# Patient Record
Sex: Female | Born: 1970
Health system: Southern US, Community
[De-identification: ages and names within clinical notes are randomized; demographics above are authoritative.]

## PROBLEM LIST (undated history)

## (undated) DIAGNOSIS — E119 Type 2 diabetes mellitus without complications: Secondary | ICD-10-CM

## (undated) DIAGNOSIS — M898X9 Other specified disorders of bone, unspecified site: Secondary | ICD-10-CM

## (undated) DIAGNOSIS — I517 Cardiomegaly: Secondary | ICD-10-CM

## (undated) DIAGNOSIS — I998 Other disorder of circulatory system: Secondary | ICD-10-CM

## (undated) DIAGNOSIS — T82598A Other mechanical complication of other cardiac and vascular devices and implants, initial encounter: Secondary | ICD-10-CM

## (undated) DIAGNOSIS — I313 Pericardial effusion (noninflammatory): Secondary | ICD-10-CM

## (undated) DIAGNOSIS — E889 Metabolic disorder, unspecified: Secondary | ICD-10-CM

## (undated) DIAGNOSIS — E7800 Pure hypercholesterolemia, unspecified: Secondary | ICD-10-CM

## (undated) DIAGNOSIS — S88119A Complete traumatic amputation at level between knee and ankle, unspecified lower leg, initial encounter: Secondary | ICD-10-CM

## (undated) DIAGNOSIS — I509 Heart failure, unspecified: Secondary | ICD-10-CM

## (undated) DIAGNOSIS — H3500 Unspecified background retinopathy: Secondary | ICD-10-CM

## (undated) DIAGNOSIS — Z940 Kidney transplant status: Secondary | ICD-10-CM

## (undated) DIAGNOSIS — Z9289 Personal history of other medical treatment: Secondary | ICD-10-CM

## (undated) DIAGNOSIS — I10 Essential (primary) hypertension: Secondary | ICD-10-CM

## (undated) DIAGNOSIS — D649 Anemia, unspecified: Secondary | ICD-10-CM

## (undated) DIAGNOSIS — N189 Chronic kidney disease, unspecified: Secondary | ICD-10-CM

## (undated) DIAGNOSIS — I951 Orthostatic hypotension: Secondary | ICD-10-CM

## (undated) DIAGNOSIS — M9080 Osteopathy in diseases classified elsewhere, unspecified site: Secondary | ICD-10-CM

## (undated) DIAGNOSIS — K219 Gastro-esophageal reflux disease without esophagitis: Secondary | ICD-10-CM

## (undated) DIAGNOSIS — I739 Peripheral vascular disease, unspecified: Secondary | ICD-10-CM

## (undated) DIAGNOSIS — I3139 Other pericardial effusion (noninflammatory): Secondary | ICD-10-CM

## (undated) HISTORY — DX: Essential (primary) hypertension: I10

## (undated) HISTORY — DX: Complete traumatic amputation at level between knee and ankle, unspecified lower leg, initial encounter: S88.119A

## (undated) HISTORY — DX: Chronic kidney disease, unspecified: N18.9

## (undated) HISTORY — DX: Cardiomegaly: I51.7

## (undated) HISTORY — DX: Other specified disorders of bone, unspecified site: M89.8X9

## (undated) HISTORY — DX: Metabolic disorder, unspecified: E88.9

## (undated) HISTORY — PX: BLADDER SUSPENSION: SHX72

## (undated) HISTORY — PX: ABOVE KNEE LEG AMPUTATION: SUR20

## (undated) HISTORY — PX: AMPUTATION FINGER: SHX6594

## (undated) HISTORY — DX: Other mechanical complication of other cardiac and vascular devices and implants, initial encounter: T82.598A

## (undated) HISTORY — DX: Anemia, unspecified: D64.9

## (undated) HISTORY — DX: Unspecified background retinopathy: H35.00

## (undated) HISTORY — DX: Pericardial effusion (noninflammatory): I31.3

## (undated) HISTORY — DX: Other pericardial effusion (noninflammatory): I31.39

## (undated) HISTORY — PX: NEPHRECTOMY TRANSPLANTED ORGAN: SUR880

## (undated) HISTORY — DX: Other disorder of circulatory system: I99.8

## (undated) HISTORY — DX: Osteopathy in diseases classified elsewhere, unspecified site: M90.80

## (undated) HISTORY — PX: OTHER SURGICAL HISTORY: SHX169

## (undated) HISTORY — DX: Pure hypercholesterolemia, unspecified: E78.00

---

## 1998-04-13 ENCOUNTER — Other Ambulatory Visit: Admission: RE | Admit: 1998-04-13 | Discharge: 1998-04-13 | Payer: Self-pay | Admitting: *Deleted

## 1998-05-03 ENCOUNTER — Encounter: Payer: Self-pay | Admitting: Obstetrics and Gynecology

## 1998-05-03 ENCOUNTER — Ambulatory Visit (HOSPITAL_COMMUNITY): Admission: RE | Admit: 1998-05-03 | Discharge: 1998-05-03 | Payer: Self-pay | Admitting: Obstetrics and Gynecology

## 1998-05-05 ENCOUNTER — Encounter: Admission: RE | Admit: 1998-05-05 | Discharge: 1998-08-03 | Payer: Self-pay | Admitting: Family Medicine

## 2000-09-15 ENCOUNTER — Encounter: Admission: RE | Admit: 2000-09-15 | Discharge: 2000-09-15 | Payer: Self-pay | Admitting: Family Medicine

## 2001-06-19 ENCOUNTER — Encounter: Admission: RE | Admit: 2001-06-19 | Discharge: 2001-06-19 | Payer: Self-pay | Admitting: Family Medicine

## 2001-08-10 ENCOUNTER — Emergency Department (HOSPITAL_COMMUNITY): Admission: EM | Admit: 2001-08-10 | Discharge: 2001-08-10 | Payer: Self-pay

## 2002-09-16 ENCOUNTER — Encounter (INDEPENDENT_AMBULATORY_CARE_PROVIDER_SITE_OTHER): Payer: Self-pay | Admitting: *Deleted

## 2002-09-16 ENCOUNTER — Encounter: Admission: RE | Admit: 2002-09-16 | Discharge: 2002-09-16 | Payer: Self-pay | Admitting: Family Medicine

## 2002-10-07 ENCOUNTER — Encounter: Admission: RE | Admit: 2002-10-07 | Discharge: 2002-10-07 | Payer: Self-pay | Admitting: Family Medicine

## 2002-11-09 ENCOUNTER — Encounter: Admission: RE | Admit: 2002-11-09 | Discharge: 2002-11-09 | Payer: Self-pay | Admitting: Family Medicine

## 2002-11-24 ENCOUNTER — Encounter: Admission: RE | Admit: 2002-11-24 | Discharge: 2002-11-24 | Payer: Self-pay | Admitting: Family Medicine

## 2002-12-07 ENCOUNTER — Encounter: Admission: RE | Admit: 2002-12-07 | Discharge: 2002-12-07 | Payer: Self-pay | Admitting: Family Medicine

## 2003-04-20 ENCOUNTER — Emergency Department (HOSPITAL_COMMUNITY): Admission: EM | Admit: 2003-04-20 | Discharge: 2003-04-20 | Payer: Self-pay | Admitting: Emergency Medicine

## 2003-10-05 ENCOUNTER — Encounter: Admission: RE | Admit: 2003-10-05 | Discharge: 2003-10-05 | Payer: Self-pay | Admitting: Family Medicine

## 2003-12-23 ENCOUNTER — Encounter: Admission: RE | Admit: 2003-12-23 | Discharge: 2003-12-23 | Payer: Self-pay | Admitting: Sports Medicine

## 2004-04-25 ENCOUNTER — Ambulatory Visit: Payer: Self-pay | Admitting: Family Medicine

## 2004-04-27 ENCOUNTER — Encounter: Admission: RE | Admit: 2004-04-27 | Discharge: 2004-04-27 | Payer: Self-pay | Admitting: Sports Medicine

## 2004-10-05 ENCOUNTER — Ambulatory Visit: Payer: Self-pay | Admitting: Family Medicine

## 2005-01-21 ENCOUNTER — Ambulatory Visit: Payer: Self-pay | Admitting: Family Medicine

## 2005-01-25 ENCOUNTER — Encounter: Admission: RE | Admit: 2005-01-25 | Discharge: 2005-01-25 | Payer: Self-pay | Admitting: Sports Medicine

## 2005-01-31 ENCOUNTER — Emergency Department (HOSPITAL_COMMUNITY): Admission: EM | Admit: 2005-01-31 | Discharge: 2005-01-31 | Payer: Self-pay | Admitting: Family Medicine

## 2005-02-01 ENCOUNTER — Ambulatory Visit: Payer: Self-pay | Admitting: Family Medicine

## 2005-02-08 ENCOUNTER — Ambulatory Visit: Payer: Self-pay | Admitting: Family Medicine

## 2005-10-17 ENCOUNTER — Other Ambulatory Visit: Admission: RE | Admit: 2005-10-17 | Discharge: 2005-10-17 | Payer: Self-pay | Admitting: Family Medicine

## 2005-10-17 ENCOUNTER — Ambulatory Visit: Payer: Self-pay | Admitting: Family Medicine

## 2005-12-06 ENCOUNTER — Ambulatory Visit: Payer: Self-pay | Admitting: Family Medicine

## 2005-12-25 ENCOUNTER — Encounter: Admission: RE | Admit: 2005-12-25 | Discharge: 2005-12-25 | Payer: Self-pay | Admitting: Family Medicine

## 2006-04-17 ENCOUNTER — Encounter (INDEPENDENT_AMBULATORY_CARE_PROVIDER_SITE_OTHER): Payer: Self-pay | Admitting: *Deleted

## 2006-04-17 LAB — CONVERTED CEMR LAB

## 2006-04-28 ENCOUNTER — Ambulatory Visit: Payer: Self-pay | Admitting: Sports Medicine

## 2006-05-01 ENCOUNTER — Ambulatory Visit: Payer: Self-pay | Admitting: Family Medicine

## 2006-05-14 ENCOUNTER — Ambulatory Visit: Payer: Self-pay | Admitting: Family Medicine

## 2006-05-23 ENCOUNTER — Ambulatory Visit: Payer: Self-pay | Admitting: Family Medicine

## 2006-06-06 ENCOUNTER — Ambulatory Visit: Payer: Self-pay | Admitting: Family Medicine

## 2006-06-26 ENCOUNTER — Ambulatory Visit: Payer: Self-pay | Admitting: Family Medicine

## 2006-08-14 ENCOUNTER — Ambulatory Visit: Payer: Self-pay | Admitting: Family Medicine

## 2006-08-14 DIAGNOSIS — IMO0002 Reserved for concepts with insufficient information to code with codable children: Secondary | ICD-10-CM | POA: Insufficient documentation

## 2006-08-14 DIAGNOSIS — N6029 Fibroadenosis of unspecified breast: Secondary | ICD-10-CM | POA: Insufficient documentation

## 2006-08-14 DIAGNOSIS — E1169 Type 2 diabetes mellitus with other specified complication: Secondary | ICD-10-CM | POA: Insufficient documentation

## 2006-08-14 DIAGNOSIS — E7800 Pure hypercholesterolemia, unspecified: Secondary | ICD-10-CM

## 2006-08-14 DIAGNOSIS — E669 Obesity, unspecified: Secondary | ICD-10-CM | POA: Insufficient documentation

## 2006-08-14 DIAGNOSIS — E1151 Type 2 diabetes mellitus with diabetic peripheral angiopathy without gangrene: Secondary | ICD-10-CM | POA: Insufficient documentation

## 2006-08-14 DIAGNOSIS — N926 Irregular menstruation, unspecified: Secondary | ICD-10-CM | POA: Insufficient documentation

## 2006-08-14 DIAGNOSIS — E785 Hyperlipidemia, unspecified: Secondary | ICD-10-CM | POA: Insufficient documentation

## 2006-08-14 DIAGNOSIS — E1165 Type 2 diabetes mellitus with hyperglycemia: Secondary | ICD-10-CM

## 2006-08-14 HISTORY — DX: Pure hypercholesterolemia, unspecified: E78.00

## 2006-08-15 ENCOUNTER — Encounter (INDEPENDENT_AMBULATORY_CARE_PROVIDER_SITE_OTHER): Payer: Self-pay | Admitting: *Deleted

## 2007-01-02 ENCOUNTER — Encounter: Payer: Self-pay | Admitting: Family Medicine

## 2007-01-02 ENCOUNTER — Ambulatory Visit: Payer: Self-pay | Admitting: Family Medicine

## 2007-01-02 LAB — CONVERTED CEMR LAB
ALT: 17 units/L (ref 0–35)
AST: 16 units/L (ref 0–37)
Albumin: 4.1 g/dL (ref 3.5–5.2)
Alkaline Phosphatase: 61 units/L (ref 39–117)
BUN: 12 mg/dL (ref 6–23)
CO2: 22 meq/L (ref 19–32)
Calcium: 9.8 mg/dL (ref 8.4–10.5)
Chloride: 99 meq/L (ref 96–112)
Creatinine, Ser: 0.9 mg/dL (ref 0.40–1.20)
Glucose, Bld: 487 mg/dL — ABNORMAL HIGH (ref 70–99)
Hgb A1c MFr Bld: 14 %
Potassium: 5.3 meq/L (ref 3.5–5.3)
Sodium: 138 meq/L (ref 135–145)
TSH: 3.351 microintl units/mL (ref 0.350–5.50)
Total Bilirubin: 0.3 mg/dL (ref 0.3–1.2)
Total CK: 42 units/L (ref 7–177)
Total Protein: 7.3 g/dL (ref 6.0–8.3)

## 2007-06-23 ENCOUNTER — Telehealth: Payer: Self-pay | Admitting: Family Medicine

## 2007-08-06 ENCOUNTER — Encounter: Payer: Self-pay | Admitting: Family Medicine

## 2007-08-06 ENCOUNTER — Ambulatory Visit: Payer: Self-pay | Admitting: Family Medicine

## 2007-08-06 LAB — CONVERTED CEMR LAB
ALT: 25 units/L (ref 0–35)
AST: 15 units/L (ref 0–37)
Albumin: 4.4 g/dL (ref 3.5–5.2)
Alkaline Phosphatase: 67 units/L (ref 39–117)
BUN: 12 mg/dL (ref 6–23)
Beta hcg, urine, semiquantitative: NEGATIVE
CO2: 25 meq/L (ref 19–32)
Calcium: 10.4 mg/dL (ref 8.4–10.5)
Chlamydia, DNA Probe: NEGATIVE
Chloride: 94 meq/L — ABNORMAL LOW (ref 96–112)
Creatinine, Ser: 0.84 mg/dL (ref 0.40–1.20)
Direct LDL: 233 mg/dL — ABNORMAL HIGH
GC Probe Amp, Genital: NEGATIVE
Glucose, Bld: 455 mg/dL — ABNORMAL HIGH (ref 70–99)
Hgb A1c MFr Bld: 14 %
Potassium: 4.9 meq/L (ref 3.5–5.3)
Sodium: 136 meq/L (ref 135–145)
Total Bilirubin: 0.3 mg/dL (ref 0.3–1.2)
Total Protein: 7.4 g/dL (ref 6.0–8.3)
Whiff Test: POSITIVE
hCG, Beta Chain, Quant, S: <2 milliintl units/mL

## 2007-08-07 ENCOUNTER — Telehealth: Payer: Self-pay | Admitting: Family Medicine

## 2007-08-13 LAB — CONVERTED CEMR LAB
LDL Cholesterol: 233 mg/dL
Pap Smear: NORMAL
Pap Smear: NORMAL

## 2007-08-22 ENCOUNTER — Encounter: Payer: Self-pay | Admitting: Family Medicine

## 2007-09-02 ENCOUNTER — Ambulatory Visit: Payer: Self-pay | Admitting: Family Medicine

## 2007-12-26 ENCOUNTER — Emergency Department (HOSPITAL_COMMUNITY): Admission: EM | Admit: 2007-12-26 | Discharge: 2007-12-26 | Payer: Self-pay | Admitting: Family Medicine

## 2008-04-22 ENCOUNTER — Emergency Department (HOSPITAL_COMMUNITY): Admission: EM | Admit: 2008-04-22 | Discharge: 2008-04-22 | Payer: Self-pay | Admitting: Emergency Medicine

## 2008-12-21 ENCOUNTER — Encounter: Payer: Self-pay | Admitting: Family Medicine

## 2008-12-21 ENCOUNTER — Ambulatory Visit: Payer: Self-pay | Admitting: Family Medicine

## 2008-12-21 DIAGNOSIS — M109 Gout, unspecified: Secondary | ICD-10-CM | POA: Insufficient documentation

## 2008-12-21 LAB — CONVERTED CEMR LAB
Beta hcg, urine, semiquantitative: NEGATIVE
Chlamydia, DNA Probe: NEGATIVE
GC Probe Amp, Genital: NEGATIVE
Hgb A1c MFr Bld: >14 %
TSH: 2.738 microintl units/mL (ref 0.350–4.500)

## 2008-12-27 ENCOUNTER — Encounter: Payer: Self-pay | Admitting: Family Medicine

## 2009-01-11 ENCOUNTER — Ambulatory Visit: Payer: Self-pay | Admitting: Family Medicine

## 2009-01-11 LAB — CONVERTED CEMR LAB
Glucose, Urine, Semiquant: 500
Protein, U semiquant: 100
Specific Gravity, Urine: <1.005

## 2009-02-13 ENCOUNTER — Ambulatory Visit: Payer: Self-pay | Admitting: Family Medicine

## 2009-03-24 ENCOUNTER — Telehealth: Payer: Self-pay | Admitting: *Deleted

## 2009-03-31 ENCOUNTER — Encounter: Payer: Self-pay | Admitting: Family Medicine

## 2009-03-31 ENCOUNTER — Telehealth: Payer: Self-pay | Admitting: *Deleted

## 2009-03-31 ENCOUNTER — Ambulatory Visit: Payer: Self-pay | Admitting: Family Medicine

## 2009-03-31 LAB — CONVERTED CEMR LAB
ALT: 11 units/L (ref 0–35)
AST: 10 units/L (ref 0–37)
Albumin: 4 g/dL (ref 3.5–5.2)
Alkaline Phosphatase: 57 units/L (ref 39–117)
BUN: 14 mg/dL (ref 6–23)
CO2: 24 meq/L (ref 19–32)
Calcium: 9.1 mg/dL (ref 8.4–10.5)
Chloride: 102 meq/L (ref 96–112)
Creatinine, Ser: 1 mg/dL (ref 0.40–1.20)
Direct LDL: 220 mg/dL — ABNORMAL HIGH
Glucose, Bld: 326 mg/dL — ABNORMAL HIGH (ref 70–99)
Glucose: 318 mg/dL
HDL: 58 mg/dL
Potassium: 4.3 meq/L (ref 3.5–5.3)
Sodium: 139 meq/L (ref 135–145)
Total Bilirubin: 0.3 mg/dL (ref 0.3–1.2)
Total Protein: 6.8 g/dL (ref 6.0–8.3)
Triglycerides: 228 mg/dL

## 2009-04-06 ENCOUNTER — Telehealth: Payer: Self-pay | Admitting: Family Medicine

## 2009-04-28 ENCOUNTER — Encounter: Payer: Self-pay | Admitting: Family Medicine

## 2009-04-28 ENCOUNTER — Ambulatory Visit: Payer: Self-pay | Admitting: Family Medicine

## 2009-04-28 LAB — CONVERTED CEMR LAB
BUN: 21 mg/dL (ref 6–23)
CO2: 23 meq/L (ref 19–32)
Calcium: 9.9 mg/dL (ref 8.4–10.5)
Chloride: 101 meq/L (ref 96–112)
Creatinine, Ser: 1.14 mg/dL (ref 0.40–1.20)
Glucose, Bld: 285 mg/dL — ABNORMAL HIGH (ref 70–99)
Potassium: 4.6 meq/L (ref 3.5–5.3)
Sodium: 140 meq/L (ref 135–145)

## 2009-05-15 ENCOUNTER — Encounter: Payer: Self-pay | Admitting: Family Medicine

## 2009-09-22 ENCOUNTER — Ambulatory Visit: Payer: Self-pay | Admitting: Family Medicine

## 2009-09-22 ENCOUNTER — Encounter: Payer: Self-pay | Admitting: Family Medicine

## 2009-09-22 LAB — CONVERTED CEMR LAB: Hgb A1c MFr Bld: >14 %

## 2009-11-29 ENCOUNTER — Encounter: Payer: Self-pay | Admitting: Family Medicine

## 2009-11-29 ENCOUNTER — Ambulatory Visit: Payer: Self-pay | Admitting: Family Medicine

## 2009-12-05 ENCOUNTER — Telehealth: Payer: Self-pay | Admitting: Family Medicine

## 2009-12-06 ENCOUNTER — Ambulatory Visit: Payer: Self-pay | Admitting: Family Medicine

## 2009-12-06 DIAGNOSIS — R1110 Vomiting, unspecified: Secondary | ICD-10-CM | POA: Insufficient documentation

## 2009-12-25 ENCOUNTER — Ambulatory Visit: Payer: Self-pay | Admitting: Family Medicine

## 2009-12-25 ENCOUNTER — Encounter: Payer: Self-pay | Admitting: Family Medicine

## 2009-12-25 ENCOUNTER — Telehealth: Payer: Self-pay | Admitting: *Deleted

## 2009-12-25 LAB — CONVERTED CEMR LAB
BUN: 18 mg/dL (ref 6–23)
CO2: 25 meq/L (ref 19–32)
Calcium: 9.3 mg/dL (ref 8.4–10.5)
Chloride: 101 meq/L (ref 96–112)
Creatinine, Ser: 1.38 mg/dL — ABNORMAL HIGH (ref 0.40–1.20)
Glucose, Bld: 220 mg/dL — ABNORMAL HIGH (ref 70–99)
Hgb A1c MFr Bld: 11.4 %
Pap Smear: NEGATIVE
Potassium: 4.2 meq/L (ref 3.5–5.3)
Sodium: 139 meq/L (ref 135–145)

## 2010-01-02 ENCOUNTER — Encounter: Admission: RE | Admit: 2010-01-02 | Discharge: 2010-01-02 | Payer: Self-pay | Admitting: Family Medicine

## 2010-04-13 ENCOUNTER — Encounter (INDEPENDENT_AMBULATORY_CARE_PROVIDER_SITE_OTHER): Payer: Self-pay | Admitting: Pharmacist

## 2010-07-17 NOTE — Assessment & Plan Note (Signed)
Summary: f/u DM and PATIENT SUMMARY   Vital Signs:  Patient profile:   40 year old female Height:      61 inches Weight:      159 pounds BMI:     30.15 Temp:     98.0 degrees F oral BP sitting:   162 / 98  (left arm) Cuff size:   regular  Vitals Entered By: Schuyler Amor CMA (November 29, 2009 9:31 AM) CC: F/U diabetes Is Patient Diabetic? Yes Pain Assessment Patient in pain? no        Primary Care Provider:  [name]  MD  CC:  F/U diabetes.  History of Present Illness: CC: f/u DM, HTN  40 yo with T2DM dx 2000s, HTN, HLD, h/o noncompliance.  previously on byetta but stopped and tried oral meds this past year.  riddled with noncompliance.  pt endorses taking meds every visit, but then numbers have not been changing.  last visit (09/2009) pt endorsed taking meds daily as prescribed, but called pharmacy and pt had not filled glimepiride, metformin, or zestoretic since 04/28/2009 (for 5 months).   today: 1. HTN - out of meds for 1 month.  ran out and just never refilled.  feels that stress at work and at home has been contributing to noncompliance.  when on prescribed meds, bp has ben 129/86.  2. DM - Metformin BID and glimepiride daily prescribed, has not been taking regularly because thought needed to take with food, and hasn't been eating per patient.  4lb weight loss in last 3 months.  Seeing podiatrist Dr. Geroge Baseman at [facility].  has vision exam in next few months.  always has denied gi side effects from metformin.  keeps log but did not bring in today.  3. HLD - supposed to be on zocor.  brings FMLA form for me to fill out for excused absences from work due to diabetes and sickness and medical evaluations.  filled out and asked to scan into system.  needs pap after 12/21/2009, needs mammogram as just turned 39 and has fmhx of BRCA.  Habits & Providers  Alcohol-Tobacco-Diet     Tobacco Status: never  Current Medications (verified): 1)  Bayer Childrens Aspirin 81  Mg Chew (Aspirin) .... Take 1 Tablet By Mouth Once A Day 2)  Zestoretic 20-25 Mg Tabs (Lisinopril-Hydrochlorothiazide) .... One By Mouth Daily For Blood Pressure 3)  Zocor 40 Mg Tabs (Simvastatin) .Marland Kitchen.. 1 Tab By Mouth Daily 4)  Glimepiride 2 Mg Tabs (Glimepiride) .... Take One Daily 5)  Metformin Hcl 1000 Mg Tabs (Metformin Hcl) .... One By Mouth Two Times A Day With Food For Diabetes 6)  Onetouch Lancets  Misc (Lancets) .... Qs 1 Month. 7)  Blood Sugar Strips .... Use As Directed - Generic Equivalent, Qs 1 Mo 8)  Amlodipine Besylate 5 Mg Tabs (Amlodipine Besylate) .... Take One Daily in Am For Blood Pressure 9)  Byetta 5 Mcg Pen 5 Mcg/0.33ml Soln (Exenatide) .... One Injection Two Times A Day Within 1 Hour Before A Meal.  Qs 1 Month  Allergies (verified): No Known Drug Allergies  Past History:  Past medical, surgical, family and social histories (including risk factors) reviewed for relevance to current acute and chronic problems.  Past Medical History: HTN T2DM dx 2000 HLD Obesity h/o CIN1 in 2004 Diagnosed with HSV type 2  6/04 h/o chlamydia 1994  Past Surgical History: Reviewed history from 02/13/2009 and no changes required. Lasik eye surgery - 01/21/2005  Family History: Reviewed history  from 02/13/2009 and no changes required. no CAD.  +DM BRCA in 2 aunts Mom L 93 HTN Dad L. 62 HTN, PUD, CABG, used to drink a lot of ETOH Brother Died 2008-02-18 Obesity, CHF, DM  Social History: Reviewed history from 02/13/2009 and no changes required. single, No tobacco, EtOH, or drugs.  Employed working at [employer].  Received bachelor's at [employer] in accounting.  Going to go back to school for medical technology.  Currently in a 3 month relationship.  No kids.  Review of Systems       per HPI  Physical Exam  General:  Well-developed,well-nourished,in no acute distress; alert,appropriate and cooperative throughout examination Lungs:  Normal respiratory effort, chest expands  symmetrically. Lungs are clear to auscultation, no crackles or wheezes. Heart:  Normal rate and regular rhythm. S1 and S2 normal without gallop, murmur, click, rub or other extra sounds.  tachycardic  Diabetes Management Exam:    Foot Exam (with socks and/or shoes not present):       Sensory-Pinprick/Light touch:          Left medial foot (L-4): normal          Left dorsal foot (L-5): normal          Left lateral foot (S-1): normal          Right medial foot (L-4): normal          Right dorsal foot (L-5): normal          Right lateral foot (S-1): normal       Inspection:          Left foot: normal          Right foot: normal       Nails:          Left foot: normal          Right foot: normal   Impression & Recommendations:  Problem # 1:  DIABETES MELLITUS II, UNCOMPLICATED ([id number])  due for A1c next month.  h/o noncompliance.  whole year A1c has bee 14 +.  discussed need for control of sugars.  pt endorses doing well on byetta previously so started this medicine today.  Have never been able to tell if would be controlled on oral meds 2/2 noncompliance.  Discussed likely will need insulin for control.  consider referral to PK.  Asked to return in 3 wks for f/u on byetta start and to bring log of sugars to meet new PCP.  Her updated medication list for this problem includes:    Bayer Childrens Aspirin 81 Mg Chew (Aspirin) .Marland Kitchen... Take 1 tablet by mouth once a day    Zestoretic 20-25 Mg Tabs (Lisinopril-hydrochlorothiazide) ..... One by mouth daily for blood pressure    Glimepiride 2 Mg Tabs (Glimepiride) .Marland Kitchen... Take one daily    Metformin Hcl 1000 Mg Tabs (Metformin hcl) ..... One by mouth two times a day with food for diabetes    Byetta 5 Mcg Pen 5 Mcg/0.78ml Soln (Exenatide) ..... One injection two times a day within 1 hour before a meal.  qs 1 month  Labs Reviewed: Creat: 1.14 (04/28/2009)   Microalbumin: >2+   reflex to regular dipstick (01/11/2009) Reviewed HgBA1c results:  >14.0 (09/22/2009)  >14.0 (03/31/2009)  Orders: [place]- Est  Level 4 [id number])  Problem # 2:  HYPERTENSION, BENIGN SYSTEMIC (ICD-401.1)  off meds.  advised restart.  refilled scripts x 3 months. Her updated medication list for this problem includes:    Zestoretic 20-25  Mg Tabs (Lisinopril-hydrochlorothiazide) ..... One by mouth daily for blood pressure    Amlodipine Besylate 5 Mg Tabs (Amlodipine besylate) .Marland Kitchen... Take one daily in am for blood pressure  BP today: 162/98 Prior BP: 182/102 (09/22/2009)  Labs Reviewed: K+: 4.6 (04/28/2009) Creat: : 1.14 (04/28/2009)   HDL: 58 (03/31/2009)   LDL: 233 (08/13/2007)   TG: 228 (03/31/2009)  Orders: [place]- Est  Level 4 [id number])  Problem # 3:  HYPERLIPIDEMIA (ICD-272.4)  LDL 200s last check.  started zocor 40.  unsure if pt taking. Her updated medication list for this problem includes:    Zocor 40 Mg Tabs (Simvastatin) .Marland Kitchen... 1 tab by mouth daily  Labs Reviewed: SGOT: 10 (03/31/2009)   SGPT: 11 (03/31/2009)   HDL:58 (03/31/2009)  LDL:233 (08/13/2007)  Trig:228 (03/31/2009)  Orders: [place]- Est  Level 4 [id number])  Problem # 4:  GOUT (ICD-274.9)  has not been an issue since I've followed.  Problem # 5:  Preventive Health Care (ICD-V70.0) needs mammogram, needs pap as h/o CIN 1 2004.  last pap WNL.  to schedule next month.  Complete Medication List: 1)  Bayer Childrens Aspirin 81 Mg Chew (Aspirin) .... Take 1 tablet by mouth once a day 2)  Zestoretic 20-25 Mg Tabs (Lisinopril-hydrochlorothiazide) .... One by mouth daily for blood pressure 3)  Zocor 40 Mg Tabs (Simvastatin) .Marland Kitchen.. 1 tab by mouth daily 4)  Glimepiride 2 Mg Tabs (Glimepiride) .... Take one daily 5)  Metformin Hcl 1000 Mg Tabs (Metformin hcl) .... One by mouth two times a day with food for diabetes 6)  Onetouch Lancets Misc (Lancets) .... Qs 1 month. 7)  Blood Sugar Strips  .... Use as directed - generic equivalent, qs 1 mo 8)  Amlodipine Besylate 5 Mg Tabs (Amlodipine besylate)  .... Take one daily in am for blood pressure 9)  Byetta 5 Mcg Pen 5 Mcg/0.35ml Soln (Exenatide) .... One injection two times a day within 1 hour before a meal.  qs 1 month  Patient Instructions: 1)  Return in 2-3 weeks for follow up. 2)  Make appointment for complete phsycial after 12/21/2009. 3)  Start Byetta injection daily. 4)  Continue Glipizide and Metformin. 5)  Continue blood pressure medicine. 6)  When you return we will check A1c and see how your sugars are doing. 7)  Bring your log next visit as well as all your medicines.  Scripts filled out again. 8)  Good to see you today. 9)  FMLA form filled out. Prescriptions: BYETTA 5 MCG PEN 5 MCG/0.02ML SOLN (EXENATIDE) one injection two times a day within 1 hour before a meal.  qs 1 month  #1 x 1   Entered and Authorized by:   [name]  MD   Signed by:   [name]  MD on 11/29/2009   Method used:   Electronically to        [facility] (retail)       [phone]       [place], [place]  [address]       Ph: [phone]       Fax: [phone]   RxID:   [id number] AMLODIPINE BESYLATE 5 MG TABS (AMLODIPINE BESYLATE) take one daily in AM for blood pressure  #30 x 3   Entered and Authorized by:   [name]  MD   Signed by:   [name]  MD on 11/29/2009   Method used:   Electronically to  [facility] (retail)       [phone]       [place], [place]  [address]       Ph: [phone]       Fax: [phone]   RxID:   [id number] METFORMIN HCL 1000 MG TABS (METFORMIN HCL) one by mouth two times a day with food for diabetes  #60 x 3   Entered and Authorized by:   [name]  MD   Signed by:   [name]  MD on 11/29/2009   Method used:   Electronically to        [facility] (retail)       [phone]       [place], [place]  [address]        Ph: [phone]       Fax: [phone]   RxID:   [id number] GLIMEPIRIDE 2 MG TABS (GLIMEPIRIDE) take one daily  #31 x 3   Entered and Authorized by:   [name]  MD   Signed by:   [name]  MD on 11/29/2009   Method used:   Electronically to        [facility] (retail)       [phone]       [place], [place]  [address]       Ph: [phone]       Fax: [phone]   RxID:   [id number] MG TABS (SIMVASTATIN) 1 tab by mouth daily  #30 x 3   Entered and Authorized by:   [name]  MD   Signed by:   [name]  MD on 11/29/2009   Method used:   Electronically to        [facility] (retail)       [phone]       [place], [place]  [address]       Ph: [phone]       Fax: [phone]   RxID:   [id number] ZESTORETIC 20-25 MG TABS (LISINOPRIL-HYDROCHLOROTHIAZIDE) one by mouth daily for blood pressure  #30 x 3   Entered and Authorized by:   [name]  MD   Signed by:   [name]  MD on 11/29/2009   Method used:   Electronically to        [facility] (retail)       [phone]       [place], [place]  [address]       Ph: [phone]       Fax: [phone]   RxID:   [id number]    Prevention & Chronic Care Immunizations   Influenza vaccine: given  (04/26/2009)   Influenza vaccine due: 02/15/2010    Tetanus booster: 01/11/2009: Td   Tetanus booster due: 01/12/2019    Pneumococcal vaccine: Not documented  Other Screening   Pap smear: NEGATIVE FOR INTRAEPITHELIAL LESIONS OR MALIGNANCY.  (12/21/2008)   Pap smear due: 12/27/2009    Mammogram: Done.  (10/15/2004)   Mammogram due: 10/15/2005   Smoking status: never  (11/29/2009)  Diabetes Mellitus   HgbA1C: >14.0  (09/22/2009)   Hemoglobin A1C due: 11/03/2007    Eye exam: Not documented  Foot exam: yes   (11/29/2009)   Foot exam action/deferral: Do today   High risk foot: Not documented   Foot care education: Not documented   Foot exam due: 07/01/2009    Urine microalbumin/creatinine ratio: Not documented    Diabetes flowsheet reviewed?: Yes   Progress toward A1C goal: Unchanged  Lipids   Total Cholesterol: Not documented   Lipid panel action/deferral: Deferred   LDL: 233  (08/13/2007)   LDL Direct: 220  (03/31/2009)   HDL: 58  (03/31/2009)   Triglycerides: 228  (03/31/2009)   Lipid panel due: 08/16/2009    SGOT (AST): 10  (03/31/2009)   BMP action: Deferred   SGPT (ALT): 11  (03/31/2009)   Alkaline phosphatase: 57  (03/31/2009)   Total bilirubin: 0.3  (03/31/2009)   Liver panel due: 05/16/2009    Lipid flowsheet reviewed?: Yes   Progress toward LDL goal: Unchanged  Hypertension   Last Blood Pressure: 162 / 98  (11/29/2009)   Serum creatinine: 1.14  (04/28/2009)   BMP action: Deferred   Serum potassium 4.6  (123456)   Basic metabolic panel due: 123XX123    Hypertension flowsheet reviewed?: Yes   Progress toward BP goal: Unchanged  Self-Management Support :   Personal Goals (by the next clinic visit) :     Personal A1C goal: 10  (02/13/2009)     Personal blood pressure goal: 140/90  (02/13/2009)     Personal LDL goal: 100  (02/13/2009)    Diabetes self-management support: CBG self-monitoring log, Written self-care plan, Referred for self-management class, Referred for medical nutrition therapy  (03/31/2009)    Hypertension self-management support: BP self-monitoring log, Written self-care plan, Referred for medical nutrition therapy  (03/31/2009)    Lipid self-management support: Written self-care plan, Education handout, Referred for medical nutrition therapy  (03/31/2009)

## 2010-07-17 NOTE — Assessment & Plan Note (Signed)
Summary: cpe/pap   Vital Signs:  Patient profile:   40 year old female LMP:     12/09/2008 Height:      61 inches Weight:      170 pounds BMI:     32.24 Temp:     98.3 degrees F oral Pulse rate:   93 / minute Pulse rhythm:   regular BP sitting:   170 / 98  (right arm)  Vitals Entered By: Janeth Rase LPN (July  7, 624THL 624THL PM)  Serial Vital Signs/Assessments:  Time      Position  BP       Pulse  Resp  Temp     By                     152/88                         [name]  MD  CC: CPE and pap Is Patient Diabetic? No Pain Assessment Patient in pain? no      LMP (date): 12/09/2008  years   days  Enter LMP: 12/09/2008 Last PAP Result normal   Primary Care Provider:  [name]  MD  CC:  CPE and pap.  History of Present Illness: CC: CPE and pap, irregular periods  1. PAP today - had h/o CIN1 2004, thinks she's had colpo.  Since then reports normal periods.  Has had significant weight loss, states exercises daily.  breast exam today - has h/o BRCA in 2 aunts.  Needs mammo next year.  2. Irregular menses - regular periods until 07/2008.  Then since then has had irregular bleeding, lasting 3-4 days and then stops for a few days with passing of clots, then comes back on.  Not currently on OCP, never been OCP.  Currently sexually active, h/o chlamydia 1994, treated.  no vag discharge.  3. Other chronic conditions:  states "im' not on any meds for 2 years now except ASA"  and "I was told i had diabetes, but think I may not have anymore."  HTN - 170/98, recheck 152/88.  states off zestoretic.  DM - A1c 14% 2009.  Was on byetta, metformin, actos, glipizide.  Did not like metformin because caused diarrhea.  not checking sugars.  HLD - LDL 200s 2008.  Not on zocor.  Never smoked in past.  Habits & Providers  Alcohol-Tobacco-Diet     Alcohol drinks/day: 0     Tobacco Status: never  Exercise-Depression-Behavior     Does Patient Exercise: yes     Exercise  Counseling: not indicated; exercise is adequate     Type of exercise: walking, stairs     Exercise (avg: min/session): 56min     Times/week: 5     Have you felt down or hopeless? 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 Breasts:  No mass, nodules, thickening, tenderness, bulging, retraction, inflamation, nipple discharge or skin changes noted.   Lungs:  Normal respiratory effort, chest expands symmetrically. Lungs are clear to auscultation, no crackles or wheezes. Heart:  Normal rate and regular rhythm. S1 and S2 normal without gallop, murmur, click, rub or other extra sounds. Genitalia:  Pelvic Exam:        External: normal female genitalia without lesions or masses        Vagina: normal without lesions or masses        Cervix: slight erythema around os        Adnexa: normal bimanual exam without masses or fullness        Uterus: normal by palpation        Pap smear: performed Extremities:  No clubbing, cyanosis, edema, or deformity noted.     Impression & Recommendations:  Problem # 1:  SCREENING FOR MALIGNANT NEOPLASM OF THE CERVIX (ICD-V76.2) Assessment Unchanged h/o CIN 1 2004.  rpt pap today.  likely rpt qyearly.   Orders: Pap Smear-[facility] [id number])  Problem # 2:  SEXUALLY TRANSMITTED DISEASE, EXPOSURE TO (ICD-V01.6) checked for STDs given no protection and currently sexually active.  states only with one partner.  Problem # 3:  IRREGULAR MENSES (ICD-626.4) Assessment: New  check basic labwork, Upreg.   Further discuss next visit.  ? PCOS.  blood sugars out of control. Orders: U Preg-[facility] ([id number]) TSH-[facility] [id number])  Problem # 4:  DIABETES MELLITUS II, UNCOMPLICATED (XX123456) Assessment: Unchanged A1c >14.  restart metformin, start keeping track of logs.  rtc 2 wks.  Her updated medication list for this problem includes:    Bayer Childrens Aspirin 81 Mg Chew (Aspirin) .Marland Kitchen... Take 1 tablet by mouth once a day    Byetta 5 Mcg Pen 5 Mcg/0.32ml Soln (Exenatide) ..... Inject 5 mcg subcutaneously twice a day    Zestoretic 20-25 Mg Tabs (Lisinopril-hydrochlorothiazide) .Marland Kitchen... Take 1 tablet by mouth once a day    Glipizide Xl 10 Mg Tb24 (Glipizide) .Marland Kitchen... 2 tabs by mouth daily    Metformin Hcl 500 Mg Tabs (Metformin hcl) .Marland Kitchen... 1 tab by mouth nightly    Actos 15 Mg Tabs (Pioglitazone hcl) .Marland Kitchen... 1 tab by mouth daily  Orders: A1C-[facility] [id number])  Labs Reviewed: Creat: 0.84 (08/06/2007)    Reviewed HgBA1c results: 14.0 (08/06/2007)  14.0 (01/02/2007)  Problem # 5:  HYPERTENSION, BENIGN SYSTEMIC (ICD-401.1) Assessment: Unchanged restart zestoretic.  discussed importance of treating DM, HTN. Her updated medication list for this problem includes:    Zestoretic 20-25 Mg Tabs (Lisinopril-hydrochlorothiazide) .Marland Kitchen... Take 1 tablet by mouth once a day  BP today: 170/98 Prior BP: 172/98 (09/02/2007)  Labs Reviewed: K+: 4.9 (08/06/2007) Creat: : 0.84 (08/06/2007)   LDL: 233 (08/13/2007)     Problem # 6:  HYPERLIPIDEMIA (ICD-272.4) restartr zocor.  RTC 2 wks. Her updated medication list for this problem includes:    Zocor 40 Mg Tabs (Simvastatin) .Marland Kitchen... 1 tab by mouth daily  Complete Medication List: 1)  Bayer Childrens Aspirin 81 Mg Chew (Aspirin) .... Take 1 tablet by mouth once a day 2)  Byetta 5 Mcg Pen 5 Mcg/0.77ml Soln (Exenatide) .... Inject 5 mcg subcutaneously twice a day 3)  Zestoretic 20-25 Mg Tabs (Lisinopril-hydrochlorothiazide) .... Take 1 tablet by mouth once a day 4)  Zocor 40 Mg  Tabs (Simvastatin) .Marland Kitchen.. 1 tab by mouth daily 5)  Glipizide Xl 10 Mg Tb24 (Glipizide) .... 2 tabs by mouth daily 6)  Metformin Hcl 500 Mg Tabs (Metformin hcl) .Marland Kitchen.. 1 tab by mouth nightly 7)  Actos 15 Mg Tabs (Pioglitazone hcl) .Marland Kitchen.. 1 tab  by mouth daily  Other Orders: GC/Chlamydia-[facility] ([id number])  Patient Instructions: 1)  Please return in 2-3 weeks for follow up of your cycle and Diabetes and blood pressure. 2)  Start taking Zestoretic for blood pressure - it is too high! 3)  Start taking Metformin for Diabetes. 4)  Start taking Zocor for cholesterol. 5)  Start checking your blood sugars in the morning and bring to your next appointment.  I have given you a script for your glucometer today. 6)  We have done a pap smear and an STD test today.  your Urine pregnancy test was negative today. 7)  call clinic with questions. Prescriptions: METFORMIN HCL 500 MG  TABS (METFORMIN HCL) 1 tab by mouth nightly  #32 x 3   Entered and Authorized by:   [name]  MD   Signed by:   [name]  MD on 12/21/2008   Method used:   Electronically to        [facility] [address] [phone]* (retail)       [address]       [place], [place]  [address]       Ph: [phone] or [phone]       Fax: [phone]   RxID:   [id number] ZOCOR 40 MG TABS (SIMVASTATIN) 1 tab by mouth daily  #30 x 5   Entered and Authorized by:   [name]  MD   Signed by:   [name]  MD on 12/21/2008   Method used:   [facility]        [facility] [address] [phone]* (retail)       [address]       [place], [place]  [address]       Ph: [phone] or [phone]       Fax: [phone]   RxID:   [id number] ZESTORETIC 20-25 MG TABS (LISINOPRIL-HYDROCHLOROTHIAZIDE) Take 1 tablet by mouth once a day  #30 x 11   Entered and Authorized by:   [name]  MD   Signed by:   [name]  MD on 12/21/2008   Method used:    [facility]        [facility] [address] [phone]* (retail)       [address]       [place], [place]  [address]       Ph: [phone] or [phone]       Fax: [phone]   RxID:   [id number] METFORMIN HCL 500 MG  TABS (METFORMIN HCL) 1 tab by mouth nightly  #32 x 3   Entered and Authorized by:   [name]  MD   Signed by:   [name]  MD on 12/21/2008   Method used:   Print then Give to Patient   RxID:   [id number] ZOCOR 40 MG TABS (SIMVASTATIN) 1 tab by mouth daily  #30 x 5   Entered and Authorized by:   [name]  MD   Signed by:   [name]  MD on 12/21/2008   Method used:   Print then Give to Patient   RxID:   [id number] ZESTORETIC 20-25 MG TABS (LISINOPRIL-HYDROCHLOROTHIAZIDE) Take 1 tablet by mouth once a day  #30 x 11   Entered and Authorized by:   [name]  MD   Signed by:   Garlon Hatchet  Danise Mina  MD on 12/21/2008   Method used:   Print then Give to Patient   RxID:   [id number]   Laboratory Results   Urine Tests  Date/Time Received: December 21, 2008 4:04 PM  Date/Time Reported: December 21, 2008 4:11 PM     Urine HCG: negative Comments: ...........test performed by...........Marland KitchenHedy Camara, CMA   Blood Tests   Date/Time Received: December 21, 2008 4:44 PM  Date/Time Reported: December 21, 2008 4:58 PM   HGBA1C: >14.0%   (Normal Range: Non-Diabetic - 3-6%   Control Diabetic - 6-8%)  Comments: ...........test performed by...........Marland KitchenHedy Camara, CMA

## 2010-07-17 NOTE — Assessment & Plan Note (Signed)
Summary: routine visit/std testing/el   Vital Signs:  Patient Profile:   40 Years Old Female Height:     61 inches (154.94 cm) Weight:      180.5 pounds Temp:     98.3 degrees F Pulse rate:   102 / minute BP sitting:   152 / 100  (right arm)  Pt. in pain?   no  Vitals Entered By: Arnette Schaumann RN (August 06, 2007 10:47 AM)                  PCP:  Karlton Lemon MD  Chief Complaint:  needs doctor's note for work regarding diabetes and and STD check.  History of Present Illness: At beginning of todays visit patient mentioned she was pregnant.  She had a positive UPT at home in September.  She does state she has continued to have normal periods though - about 2-3 days of menses each month.  Reports + fetal movement.  No previous pregnancy.  + crampy type pain in her abdomen.  No vaginal bleeding otherwise or discharge.  Does not recall losing the pregnancy.  Did not see a physician between positive test and this office visit.  This is a new relationship and she wants to be STI tested as well.  Denies any concerns, vaginal discharge, odor, dysuria, lesions.  Diabetes Mellitus Meds:none because she thought she was pregnant and did not know what to take Taking and tolerating?none Blood sugars:?170s-200s Hypoglycemic symptoms:no Visual problems:no Monitoring feet:does not check but states she will Numbness/Tingling:no Last eye exam: none in a while Microalbumin: none A1c: 14.0 (08/06/2007 9:54:35 AM) Flu shot: yes, last fall Pneumonia  no    Current Allergies (reviewed today): No known allergies   Past Medical History:    Reviewed history from 01/02/2007 and no changes required:       HTN       DM T2       Obesity       h/o CIN1 in 2004       Diagnosed with HSV type 2  6/04       h/o chlamydia 1994      Physical Exam  General:     Gen: Well-developed, well-nourished, NAD, alert and cooperative HEENT: NCAT, PERRL, EOMI, no conjunctival inflammation, pharynx nl  without erythema or exudates, MMM. External ear exam nl - TMs without bulging or erythema.  No nasal d/c Neck: No LAN, thyromegaly, masses, bruits CV: RRR, no MRG Lungs: Nl resp effort. CTAB no wheezes, rales, rhonchi Abd: soft, nt, overweight - ?fundus felt at umbilicus but hard to tell.  No fetal heart tones heard.  BS normoactive.  No HSM.  No masses Ext: warm, well perfused.  No edema or cyanosis Skin: No rashes or lesions on visible skin Genitalia:     Normal introitus for age, no external lesions, small amt white cottage cheese-like vaginal discharge, mucosa pink and moist, no vaginal or cervical lesions, no vaginal atrophy, no friability or hemorrhage, ?normal uterus size and position, no adnexal masses or tenderness    Impression & Recommendations:  Problem # 1:  DIABETES MELLITUS II, UNCOMPLICATED ([id number]) Assessment: Deteriorated Discussed that with negative UPT she can restart metformin and glipizide.  If she were to get pregnant she would need referral to high risk clinic.  Advised f/u appointment in next month to specifically address diabetes.  a1c very uncontrolled.  ASA.  Her updated medication list for this problem includes:    Bayer Childrens Aspirin 81  Mg Chew (Aspirin) .Marland Kitchen... Take 1 tablet by mouth once a day    Byetta 5 Mcg Pen 5 Mcg/0.75ml Soln (Exenatide) ..... Inject 5 mcg subcutaneously twice a day    Zestoretic 20-25 Mg Tabs (Lisinopril-hydrochlorothiazide) .Marland Kitchen... Take 1 tablet by mouth once a day    Glipizide Xl 10 Mg Tb24 (Glipizide) .Marland Kitchen... 2 tabs by mouth daily    Metformin Hcl 500 Mg Tabs (Metformin hcl) .Marland Kitchen... 1 tab by mouth two times a day  Orders: Direct LDL-[facility] [id number]) A1C-[facility] [id number]) Comp Met-[facility] [id number]) [facility]- Est  Level 4 [id number])   Problem # 2:  PREGNANCY EXAMINATION OR TEST POSITIVE RESULT (ICD-V72.42) Assessment: New UPT here negative and has had regular periods.  Likely miscarriage or false positive.  Will check serum hcg to confirm  it is < 50. Orders: [place]- Est  Level 4 (99214)   Problem # 3:  SEXUALLY TRANSMITTED DISEASE, EXPOSURE TO (ICD-V01.6) Assessment: Comment Only Patient would like to be checked for STIs - labs sent.  Orders: GC/Chlamydia-[facility] ([id number]) RPR-[facility] [id number]) HIV-[facility] [id number]) [place]- Est  Level 4 (99214)   Problem # 4:  SCREENING FOR MALIGNANT NEOPLASM OF THE CERVIX (ICD-V76.2) Assessment: Comment Only Pap sent. Orders: Pap Smear-[facility] [id number]) [place]- Est  Level 4 [id number]) Orders: Pap Smear-[facility] [id number])   Problem # 5:  VAGINITIS, BACTERIAL (ICD-616.10) Assessment: New See instructions.  Her updated medication list for this problem includes:    Metronidazole 500 Mg Tabs (Metronidazole) .Marland Kitchen... 1 tab by mouth two times a day x 7 days  Orders: [facility]- Est  Level 4 [id number])   Complete Medication List: 1)  Bayer Childrens Aspirin 81 Mg Chew (Aspirin) .... Take 1 tablet by mouth once a day 2)  Byetta 5 Mcg Pen 5 Mcg/0.6ml Soln (Exenatide) .... Inject 5 mcg subcutaneously twice a day 3)  Zestoretic 20-25 Mg Tabs (Lisinopril-hydrochlorothiazide) .... Take 1 tablet by mouth once a day 4)  Zocor 40 Mg Tabs (Simvastatin) .Marland Kitchen.. 1 tab by mouth daily 5)  Glipizide Xl 10 Mg Tb24 (Glipizide) .... 2 tabs by mouth daily 6)  Metformin Hcl 500 Mg Tabs (Metformin hcl) .Marland Kitchen.. 1 tab by mouth two times a day 7)  Metronidazole 500 Mg Tabs (Metronidazole) .Marland Kitchen.. 1 tab by mouth two times a day x 7 days  Other Orders: Wet Prep- [place] [id number]) U Preg-[facility] ([id number]) B-HCG Quant-[facility] [id number])   Patient Instructions: 1)  Start taking metformin and glipizide for your diabetes. 2)  Check your sugars at least once a day and document them - bring to each appointment. 3)  Check your skin for any areas of breakdown (especially feet). 4)  Follow-up with a dentist every 6 months. 5)  Take a baby Aspirin daily. 6)  You have an overgrowth of bacteria in your vagina, not a sexually transmitted infection. 7)   Take the full course of the antibiotic prescribed. 8)  Call the office if you continue to have symptoms after a week or symptoms return. 9)  Abstaining from sex or using condoms may prevent recurrence of the overgrowth. 10)  No alcohol if on flagyl(metronidazole).  It can also cause nausea or a metallic taste in your mouth though these are not dangerous. 11)  Follow up with me in 1 month.  It's important that we get your sugars under control to keep you from going blind, being on dialysis, or losing a leg!    Prescriptions: METRONIDAZOLE 500 MG  TABS (METRONIDAZOLE) 1 tab by mouth two times a day x 7 days  #  14 x 0   Entered and Authorized by:   Karlton Lemon MD   Signed by:   Karlton Lemon MD on 08/06/2007   Method used:   Print then Give to Patient   RxID:   [id number] GLIPIZIDE XL 10 MG TB24 (GLIPIZIDE) 2 tabs by mouth daily  #60 x 1   Entered and Authorized by:   Karlton Lemon MD   Signed by:   Karlton Lemon MD on 08/06/2007   Method used:   Print then Give to Patient   RxID:   [id number] METFORMIN HCL 500 MG  TABS (METFORMIN HCL) 1 tab by mouth two times a day  #60 x 1   Entered and Authorized by:   Karlton Lemon MD   Signed by:   Karlton Lemon MD on 08/06/2007   Method used:   Print then Give to Patient   RxID:   [id number]  ] Laboratory Results   Urine Tests  Date/Time Received: August 06, 2007 11:43 AM  Date/Time Reported: August 06, 2007 11:51 AM     Urine HCG: negative Comments: TEST PERFORMED BY BONITA PATTERSON, CMA STUDENT ...................................................................DONNA [facility]  August 06, 2007 11:51 AM   Blood Tests   Date/Time Received: August 06, 2007 12:25  PM  Date/Time Reported: August 06, 2007 12:43 PM   HGBA1C: 14.0%   (Normal Range: Non-Diabetic - 3-6%   Control Diabetic - 6-8%)  Comments: ...............test performed by......Marland KitchenBonnie A. Martinique, [place] (ASCP)  Date/Time Received: August 06, 2007  11:43 AM  Date/Time Reported: August 06, 2007 11:49 AM   [place]/KOH Source: VAGINAL WBC/hpf: 1-5 Bacteria/hpf: 3+  Cocci Clue cells/hpf: moderate  Positive whiff Yeast/hpf: none Trichomonas/hpf: few Comments: ...................................................................DONNA [facility]  August 06, 2007 11:49 AM

## 2010-07-17 NOTE — Assessment & Plan Note (Signed)
Summary: f/u DM, HTN   Vital Signs:  Patient profile:   40 year old female Height:      61 inches Weight:      163 pounds BMI:     30.91 Temp:     98.5 degrees F Pulse rate:   99 / minute BP sitting:   184 / 108  Vitals Entered By: Mauricia Area CMA, (March 31, 2009 8:22 AM)  Primary Care Provider:  [name]  MD   History of Present Illness: CC: f/u DM, HTN  1. DM - not on any meds x ASA.  says too expensive, would like sent to [facility] now.  No glucometer yet.  states last eye exam was last november with Dr. Ricki Miller.  no records.  A1c >14% 2009-01-24.  states brother died last 2023/02/25 from CHF and diabetes complications (was one year older than Dominica).  Notes R foot numbness, tingling, at times painful.  2. HTN - not on zestoretic.  no HA, vision changes, chest pain, tightness, urinary changes, LE swelling.  BP today 184/108.  3. HLD - not on zocor 40mg  daily.  states had FLP done last week at work.  HDL 58, Trig 228, no LDL.  Will need CMP today.  4. obesity - 165-->163 lbs.  does not know what food to eat despite talking about this last visit.  last visit to nutrition center was years ago.  5. preventative - UTD.  mammo to start at 39 given fm hx.  Meds she is currently on: ASA QD  TO receive flu shot at work.  Habits & Providers  Alcohol-Tobacco-Diet     Tobacco Status: never     Diet Comments: no special diet per pt  Exercise-Depression-Behavior     Does Patient Exercise: yes     Type of exercise: walk, stairs     Drug Use: never     Seat Belt Use: always     Sun Exposure: infrequent  Current Medications (verified): 1)  Bayer Childrens Aspirin 81 Mg Chew (Aspirin) .... Take 1 Tablet By Mouth Once A Day 2)  Byetta 5 Mcg Pen 5 Mcg/0.68ml Soln (Exenatide) .... Inject 5 Mcg Subcutaneously Twice A Day 3)  Zestoretic 20-12.5 Mg Tabs (Lisinopril-Hydrochlorothiazide) .... Take One By Mouth Daily 4)  Zocor 40 Mg Tabs (Simvastatin) .Marland Kitchen.. 1 Tab By Mouth Daily 5)   Glipizide Xl 10 Mg Tb24 (Glipizide) .... One Tab By Mouth Daily 6)  Metformin Hcl 500 Mg  Tabs (Metformin Hcl) .... One By Mouth Two Times A Day With Food 7)  Exactech Test  Strp (Glucose Blood) .... Use Daily As Directed - Generic Equivalent 8)  Lancets  Misc (Lancets) .... Use As Directed - Generic Equivalent  Allergies (verified): No Known Drug Allergies  Past History:  Past medical, surgical, family and social histories (including risk factors) reviewed, and no changes noted (except as noted below).  Past Medical History: Reviewed history from 01/02/2007 and no changes required. HTN DM T2 Obesity h/o CIN1 in 2004 Diagnosed with HSV type 2  6/04 h/o chlamydia 1994  Past Surgical History: Reviewed history from 02/13/2009 and no changes required. Lasik eye surgery - 01/21/2005  Family History: Reviewed history from 02/13/2009 and no changes required. no CAD.  +DM BRCA in 2 aunts Mom L 68 HTN Dad L. 62 HTN, PUD, CABG, used to drink a lot of ETOH Brother Died 02-25-2008 Obesity, CHF, DM  Social History: Reviewed history from 02/13/2009 and no changes required. single, No tobacco, EtOH,  or drugs.  Employed working at [employer].  Received bachelor's at [employer] in accounting.  Going to go back to school for medical technology.  Currently in a 3 month relationship.  No kids.Sun Exposure-Excessive:  infrequent  Physical Exam  General:  Well-developed,well-nourished,in no acute distress; alert,appropriate and cooperative throughout examination Lungs:  Normal respiratory effort, chest expands symmetrically. Lungs are clear to auscultation, no crackles or wheezes. Heart:  Normal rate and regular rhythm. S1 and S2 normal without gallop, murmur, click, rub or other extra sounds.  tachycardic  Diabetes Management Exam:    Foot Exam (with socks and/or shoes not present):       Sensory-Pinprick/Light touch:          Left medial foot (L-4): normal          Left dorsal foot (L-5): normal           Left lateral foot (S-1): normal          Right medial foot (L-4): normal          Right dorsal foot (L-5): normal          Right lateral foot (S-1): normal       Sensory-Monofilament:          Left foot: normal          Right foot: diminished       Inspection:          Left foot: normal          Right foot: normal       Nails:          Left foot: normal          Right foot: thickened   Impression & Recommendations:  Problem # 1:  DIABETES MELLITUS II, UNCOMPLICATED (XX123456)  not taking any meds.  restart Zestoretic, metformin.    The following medications were removed from the medication list:    Actos 15 Mg Tabs (Pioglitazone hcl) .Marland Kitchen... 1 tab by mouth daily Her updated medication list for this problem includes:    Bayer Childrens Aspirin 81 Mg Chew (Aspirin) .Marland Kitchen... Take 1 tablet by mouth once a day    Byetta 5 Mcg Pen 5 Mcg/0.7ml Soln (Exenatide) ..... Inject 5 mcg subcutaneously twice a day    Zestoretic 20-12.5 Mg Tabs (Lisinopril-hydrochlorothiazide) .Marland Kitchen... Take one by mouth daily    Glipizide Xl 10 Mg Tb24 (Glipizide) ..... One tab by mouth daily    Metformin Hcl 500 Mg Tabs (Metformin hcl) ..... One by mouth two times a day with food  Orders: A1C-[facility] [id number]) Comp Met-[facility] [id number]) Diabetic Clinic Referral (Diabetic) [facility]- Est  Level 4 [id number])  Labs Reviewed: Creat: 0.84 (08/06/2007)   Microalbumin: >2+   reflex to regular dipstick (01/11/2009) Reviewed HgBA1c results: >14.0 (12/21/2008)  14.0 (08/06/2007)  Problem # 2:  HYPERTENSION, BENIGN SYSTEMIC (ICD-401.1)  restart.  increase HCTZ next visit.  not all at once as don't want to start high dose HCTZ with ACEI without monitoring kidneys. Her updated medication list for this problem includes:    Zestoretic 20-12.5 Mg Tabs (Lisinopril-hydrochlorothiazide) .Marland Kitchen... Take one by mouth daily  Orders: Comp Met-[facility] [id number]) [facility]- Est  Level 4 (99214)  BP today: 184/108 Prior BP: 146/104  (02/13/2009)  Labs Reviewed: K+: 4.9 (08/06/2007) Creat: : 0.84 (08/06/2007)   LDL: 233 (08/13/2007)     Problem # 3:  HYPERLIPIDEMIA (ICD-272.4)  dLDL today Her updated medication list for this problem includes:    Zocor 40 Mg Tabs (  Simvastatin) .Marland Kitchen... 1 tab by mouth daily  Orders: Direct LDL-[facility] [id number]) [facility]- Est  Level 4 [id number])  Labs Reviewed: SGOT: 15 (08/06/2007)   SGPT: 25 (08/06/2007)   LDL:233 (08/13/2007)  Problem # 4:  OBESITY, NOS (ICD-278.00) 2lb weight loss. encouraged. Orders: [facility]- Est  Level 4 [id number])  Complete Medication List: 1)  Bayer Childrens Aspirin 81 Mg Chew (Aspirin) .... Take 1 tablet by mouth once a day 2)  Byetta 5 Mcg Pen 5 Mcg/0.66ml Soln (Exenatide) .... Inject 5 mcg subcutaneously twice a day 3)  Zestoretic 20-12.5 Mg Tabs (Lisinopril-hydrochlorothiazide) .... Take one by mouth daily 4)  Zocor 40 Mg Tabs (Simvastatin) .Marland Kitchen.. 1 tab by mouth daily 5)  Glipizide Xl 10 Mg Tb24 (Glipizide) .... One tab by mouth daily 6)  Metformin Hcl 500 Mg Tabs (Metformin hcl) .... One by mouth two times a day with food 7)  Exactech Test Strp (Glucose blood) .... Use daily as directed - generic equivalent 8)  Lancets Misc (Lancets) .... Use as directed - generic equivalent  Patient Instructions: 1)  Come back in 3-4 weeks. 2)  Start Metformin, Zestoretic and Zocor 3)  We will send you to the diabetes education center. 4)  Call clinic with questions. 5)  Get a glucometer friday and start checking sugars in the morning fasting and bring to your next visit (we gave you a log today). 6)  Today we drew blood.   Prevention & Chronic Care Immunizations   Influenza vaccine: given  (04/18/2007)   Influenza vaccine due: 04/17/2008    Tetanus booster: 01/11/2009: Td   Tetanus booster due: 06/17/2008    Pneumococcal vaccine: Not documented  Other Screening   Pap smear: NEGATIVE FOR INTRAEPITHELIAL LESIONS OR MALIGNANCY.  (12/21/2008)   Pap smear due:  12/27/2009   Smoking status: never  (03/31/2009)  Diabetes Mellitus   HgbA1C: >14.0  (12/21/2008)   Hemoglobin A1C due: 11/03/2007    Eye exam: Not documented    Foot exam: yes  (03/31/2009)   Foot exam action/deferral: Do today   High risk foot: Not documented   Foot care education: Not documented   Foot exam due: 07/01/2009    Urine microalbumin/creatinine ratio: Not documented    Diabetes flowsheet reviewed?: Yes   Progress toward A1C goal: Deteriorated  Lipids   Total Cholesterol: Not documented   Lipid panel action/deferral: Deferred   LDL: 233  (08/13/2007)   LDL Direct: 233  (08/06/2007)   HDL: 58  (03/31/2009)   Triglycerides: 228  (03/31/2009)   Lipid panel due: 08/16/2009    SGOT (AST): 15  (08/06/2007)   BMP action: Deferred   SGPT (ALT): 25  (08/06/2007) CMP ordered    Alkaline phosphatase: 67  (08/06/2007)   Total bilirubin: 0.3  (08/06/2007)   Liver panel due: 05/16/2009    Lipid flowsheet reviewed?: Yes   Progress toward LDL goal: Unchanged  Hypertension   Last Blood Pressure: 184 / 108  (03/31/2009)   Serum creatinine: 0.84  (08/06/2007)   BMP action: Deferred   Serum potassium 4.9  (08/06/2007) CMP ordered    Basic metabolic panel due: 123XX123    Hypertension flowsheet reviewed?: Yes   Progress toward BP goal: Deteriorated  Self-Management Support :   Personal Goals (by the next clinic visit) :     Personal A1C goal: 10  (02/13/2009)     Personal blood pressure goal: 140/90  (02/13/2009)     Personal LDL goal: 100  (02/13/2009)    Diabetes self-management support:  CBG self-monitoring log, Written self-care plan, Referred for self-management class, Referred for medical nutrition therapy  (03/31/2009)   Diabetes care plan printed   Referred for diabetes self-mgmt training.    Hypertension self-management support: BP self-monitoring log, Written self-care plan, Referred for medical nutrition therapy  (03/31/2009)   Hypertension  self-care plan printed.    Lipid self-management support: Written self-care plan, Education handout, Referred for medical nutrition therapy  (03/31/2009)   Lipid self-care plan printed.   Lipid education handout printed   Appended Document: A1c >14  Laboratory Results   Blood Tests   Date/Time Received: March 31, 2009 8:59 AM  Date/Time Reported: March 31, 2009 10:40 AM   HGBA1C: >14.0%   (Normal Range: Non-Diabetic - 3-6%   Control Diabetic - 6-8%)  Comments: ..............test performed by...............Marland KitchenSchuyler Amor, MA

## 2010-07-17 NOTE — Letter (Signed)
Summary: FMLA  FMLA   Imported By: [name] 12/01/2009 16:19:34  _____________________________________________________________________  External Attachment:    Type:   Image     Comment:   External Document

## 2010-07-17 NOTE — Assessment & Plan Note (Signed)
Summary: fu dm/kh   Vital Signs:  Patient profile:   40 year old female Weight:      168.5 pounds BP sitting:   179 / 102  (right arm)  Vitals Entered By: Geanie Cooley RN (January 11, 2009 2:23 PM) CC: f/u meds Is Patient Diabetic? Yes  Pain Assessment Patient in pain? no        Primary Care Michale Emmerich:  [name]  MD  CC:  f/u meds.  History of Present Illness: CC: f/u DM, HTN  brings FMLA form for me to fill out for "intermittent leave" in case patient feels sick.    1. DM - has not filled meds (restarted metformin and sent script for glucometer and lancets/strips).  states last eye exam was last november with Dr. Ricki Miller.  Will send records.  A1c >14%.  2. HTN - has not filled meds (restarted zestoretic last visit).  no HA, vision changes, chest pain, tightness, urinary changes, LE swelling.  BP today 179/102.  3. HLD - did not start zocor.    4. preventative - needs tetanus shot.    Habits & Providers  Alcohol-Tobacco-Diet     Tobacco Status: never  Allergies (verified): No Known Drug Allergies  Past History:  Past Medical History: Last updated: 01/02/2007 HTN DM T2 Obesity h/o CIN1 in 2004 Diagnosed with HSV type 2  6/04 h/o chlamydia 1994  Past Surgical History: Last updated: 01/02/2007 Lasik eye surgery - 01/21/2005 Lipid Panel 05/23/2006  TC=236, TG=142, HDL=52, LDL=156 - 05/26/2006  Family History: Last updated: 01/02/2007 No cancers, no CAD.  +DM As of 7/08: Mom L 25 HTN Dad L. 62 HTN, PUD, CABG, used to drink a lot of ETOH Brother L. 38 Obesity  Social History: Last updated: 01/02/2007 single, No tobacco, EtOH, or drugs.  Employed working at [employer].  Received bachelor's at [employer] in accounting.  Going to go back to school for medical technology.  Currently in a 2 month relationship.  No kids.  Physical Exam  General:  Well-developed,well-nourished,in no acute distress; alert,appropriate and cooperative throughout  examination Lungs:  Normal respiratory effort, chest expands symmetrically. Lungs are clear to auscultation, no crackles or wheezes. Heart:  Normal rate and regular rhythm. S1 and S2 normal without gallop, murmur, click, rub or other extra sounds. Extremities:  No clubbing, cyanosis, edema, or deformity noted.     Impression & Recommendations:  Problem # 1:  HYPERTENSION, BENIGN SYSTEMIC (ICD-401.1) Assessment Unchanged  again discussed inmprtance of medicatino use with HTN, DM, HLD and complications that we try to prevent including CVA, MI, ESRD, amputations, etc.  Pt states she will get paid this Friday and will fill meds. RTC 1 mo after starting meds.  gave paper scripts and sent to pharmacy.  Her updated medication list for this problem includes:    Zestoretic 20-12.5 Mg Tabs (Lisinopril-hydrochlorothiazide) .Marland Kitchen... Take one by mouth daily  Orders: [facility]- Est  Level 4 [id number])  Problem # 2:  HYPERLIPIDEMIA (ICD-272.4) Assessment: Unchanged  see above. Her updated medication list for this problem includes:    Zocor 40 Mg Tabs (Simvastatin) .Marland Kitchen... 1 tab by mouth daily  Orders: [place]- Est  Level 4 [id number])  Problem # 3:  DIABETES MELLITUS II, UNCOMPLICATED (XX123456)  see above.  microalb >2, 100 protein in urine.  needs ACEI.   Her updated medication list for this problem includes:    Bayer Childrens Aspirin 81 Mg Chew (Aspirin) .Marland Kitchen... Take 1 tablet by mouth once a day  Byetta 5 Mcg Pen 5 Mcg/0.89ml Soln (Exenatide) ..... Inject 5 mcg subcutaneously twice a day    Zestoretic 20-12.5 Mg Tabs (Lisinopril-hydrochlorothiazide) .Marland Kitchen... Take one by mouth daily    Glipizide Xl 10 Mg Tb24 (Glipizide) .Marland Kitchen... 2 tabs by mouth daily    Metformin Hcl 500 Mg Tabs (Metformin hcl) .Marland Kitchen... 1 tab by mouth nightly    Actos 15 Mg Tabs (Pioglitazone hcl) .Marland Kitchen... 1 tab by mouth daily    Her updated medication list for this problem includes:    Bayer Childrens Aspirin 81 Mg Chew (Aspirin) .Marland Kitchen... Take 1  tablet by mouth once a day    Byetta 5 Mcg Pen 5 Mcg/0.74ml Soln (Exenatide) ..... Inject 5 mcg subcutaneously twice a day    Zestoretic 20-25 Mg Tabs (Lisinopril-hydrochlorothiazide) .Marland Kitchen... Take 1 tablet by mouth once a day    Glipizide Xl 10 Mg Tb24 (Glipizide) .Marland Kitchen... 2 tabs by mouth daily    Metformin Hcl 500 Mg Tabs (Metformin hcl) .Marland Kitchen... 1 tab by mouth nightly    Actos 15 Mg Tabs (Pioglitazone hcl) .Marland Kitchen... 1 tab by mouth daily  Orders: UA Microalbumin-[facility] [id number]) [place]- Est  Level 4 [id number])  Labs Reviewed: Creat: 0.84 (08/06/2007)   Microalbumin: >2+   reflex to regular dipstick (01/11/2009) Reviewed HgBA1c results: >14.0 (12/21/2008)  14.0 (08/06/2007)  Complete Medication List: 1)  Bayer Childrens Aspirin 81 Mg Chew (Aspirin) .... Take 1 tablet by mouth once a day 2)  Byetta 5 Mcg Pen 5 Mcg/0.11ml Soln (Exenatide) .... Inject 5 mcg subcutaneously twice a day 3)  Zestoretic 20-12.5 Mg Tabs (Lisinopril-hydrochlorothiazide) .... Take one by mouth daily 4)  Zocor 40 Mg Tabs (Simvastatin) .Marland Kitchen.. 1 tab by mouth daily 5)  Glipizide Xl 10 Mg Tb24 (Glipizide) .... 2 tabs by mouth daily 6)  Metformin Hcl 500 Mg Tabs (Metformin hcl) .Marland Kitchen.. 1 tab by mouth nightly 7)  Actos 15 Mg Tabs (Pioglitazone hcl) .Marland Kitchen.. 1 tab by mouth daily 8)  Metronidazole 500 Mg Tabs (Metronidazole) .... Take one by mouth two times a day x 7 days 9)  Exactech Test Strp (Glucose blood) .... Use daily as directed - generic equivalent 10)  Lancets Misc (Lancets) .... Use as directed - generic equivalent  Other Orders: Tetanus Toxoid w/Dx [phone]) Admin 1st Vaccine [phone]) Admin 1st Vaccine [facility]) [phone])  Patient Instructions: 1)  Come back in 3-4 weeks (2-3 weeks after you start your medicines) 2)  Remember - Zestoretic for blood pressure, Zocor for cholesterol, and Metformin for diabetes. 3)  Call clinic with questions. 4)  Call Dr. Margarita Grizzle office and ask record to be faxed to [place] (at [phone]). 5)  I have given you  scripts for medicines and for glucometer with supplies. Prescriptions: LANCETS  MISC (LANCETS) use as directed - generic equivalent  #1 x 11   Entered and Authorized by:   [name]  MD   Signed by:   [name]  MD on 01/11/2009   Method used:   Print then Give to Patient   RxID:   [id number] EXACTECH TEST  STRP (GLUCOSE BLOOD) use daily as directed - generic equivalent  #1 x 11   Entered and Authorized by:   [name]  MD   Signed by:   [name]  MD on 01/11/2009   Method used:   Print then Give to Patient   RxID:   [id number] METFORMIN HCL 500 MG  TABS (METFORMIN HCL) 1 tab by mouth nightly  #32 x 5   Entered and  Authorized by:   [name]  MD   Signed by:   [name]  MD on 01/11/2009   Method used:   Print then Give to Patient   RxID:   [id number] ZOCOR 40 MG TABS (SIMVASTATIN) 1 tab by mouth daily  #30 x 5   Entered and Authorized by:   [name]  MD   Signed by:   [name]  MD on 01/11/2009   Method used:   Print then Give to Patient   RxID:   [id number] ZESTORETIC 20-12.5 MG TABS (LISINOPRIL-HYDROCHLOROTHIAZIDE) take one by mouth daily  #32 x 5   Entered and Authorized by:   [name]  MD   Signed by:   [name]  MD on 01/11/2009   Method used:   Print then Give to Patient   [id number] LANCETS  MISC (LANCETS) use as directed - generic equivalent  #1 x 11   Entered and Authorized by:   [name]  MD   Signed by:   [name]  MD on 01/11/2009   Method used:   Electronically to        [facility] [phone]* (retail)       [place]       [place], [place]  [address]       Ph: [phone]       Fax: [phone]   RxID:   [id number] EXACTECH TEST  STRP (GLUCOSE BLOOD) use daily as directed - generic equivalent  #1 x 11   Entered and Authorized by:   [name]  MD   Signed by:   [name]  MD on  01/11/2009   Method used:   Electronically to        [facility] [place] (retail)       [place]       [place], [place]  [address]       Ph: [phone]       Fax: [phone]   RxID:   [id number] ZESTORETIC 20-12.5 MG TABS (LISINOPRIL-HYDROCHLOROTHIAZIDE) take one by mouth daily  #32 x 5   Entered and Authorized by:   [name]  MD   Signed by:   [name]  MD on 01/11/2009   Method used:   Electronically to        [facility] [phone]* (retail)       [place] [place]       [place], [place]  [address]       Ph: [phone]       Fax: [phone]   RxID:   [id number] METFORMIN HCL 500 MG  TABS (METFORMIN HCL) 1 tab by mouth nightly  #32 x 5   Entered and Authorized by:   [name]  MD   Signed by:   [name]  MD on 01/11/2009   Method used:   Electronically to        [facility] [phone]* (retail)       [place]       [place], [place]  [address]       Ph: [phone]       Fax: [phone]   RxID:   [id number] ZOCOR 40 MG TABS (SIMVASTATIN) 1 tab by mouth daily  #  30 x 5   Entered and Authorized by:   [name]  MD   Signed by:   [name]  MD on 01/11/2009   Method used:   Electronically to        [facility] [phone]* (retail)       [place]       [place], [place]  [address]       Ph: [phone]       Fax: [phone]   RxID:   [id number]     Orders Added: 1)  UA Microalbumin-[facility] [82044] 2)  Tetanus Toxoid w/Dx [id number] 3)  Admin 1st Vaccine [90471] 4)  Admin 1st Vaccine (State) [90471S] 5)  [facility]- Est  Level 4 [id number]    Tetanus/Td Vaccine    Vaccine Type: Td    Site: right deltoid    Mfr: GlaxoSmithKline    Dose: 0.5 ml    Route: IM    Given by: Geanie Cooley RN    Exp. Date: 04/20/2011    Lot #: [id number]    VIS given: 05/05/07 version given  January 11, 2009.  Laboratory Results   Urine Tests  Date/Time Received: January 11, 2009 2:47 PM  Date/Time Reported: January 11, 2009 3:06 PM   Routine Urinalysis   Glucose: 500   (Normal Range: Negative) Spec. Gravity: <1.005   (Normal Range: 1.003-1.035) Protein: 100   (Normal Range: Negative)  Microalbumin (urine): >2+   reflex to regular dipstick mg/L   Comments: MALBU no longer needs to be checked now that protein is positive on the regular dipstick ...............test performed by......Marland KitchenBonnie A. Martinique, [place] (ASCP)

## 2010-07-17 NOTE — Progress Notes (Signed)
Summary: phone to discuss labs  Phone Note Outgoing Call   Summary of Call: called [phone].  attempted to call patient to inform of lab results.  If she calls, please let her know that sugar was elevated and cholesterol was elevated so I'm glad she's on her medicines again. Initial call taken by: [name]  MD,  April 06, 2009 5:47 PM  Follow-up for Phone Call        called again today.  discussed results.  pt states taking zocor and metformin.    if next visit not taking meds, will need to start INSULIN and refer to pete for help. Follow-up by: [name]  MD,  April 07, 2009 8:33 AM

## 2010-07-17 NOTE — Progress Notes (Signed)
Summary: resch  Phone Note Call from Patient   Summary of Call: could not come today b/c she has no money for gas.  resch for next Friday Initial call taken by: Audie Clear,  March 24, 2009 8:37 AM  Follow-up for Phone Call        thanks Follow-up by: [name]  MD,  March 24, 2009 8:41 AM

## 2010-07-17 NOTE — Progress Notes (Signed)
Summary: Rx Prob  Phone Note Call from Patient Call back at [phone]   Caller: Patient Summary of Call: pt was to have her Metformin called in today 500mg  2xs a day.  Pt uses [facility] on [address].  Also her Zestoretic and her Zocor called in.  Pt saw Dr. Danise Mina. Initial call taken by: Raymond Gurney,  March 31, 2009 3:12 PM  Follow-up for Phone Call        will forward message to MD. tried to call patient back but she is at work her mother states. Follow-up by: Marcell Barlow RN,  March 31, 2009 3:30 PM  Additional Follow-up for Phone Call Additional follow up Details #1::        refilled. please call. Additional Follow-up by: [name]  MD,  April 01, 2009 10:57 PM    Additional Follow-up for Phone Call Additional follow up Details #2::    pt notified Follow-up by: Elige Radon RN,  April 03, 2009 9:12 AM  Prescriptions: METFORMIN HCL 500 MG  TABS (METFORMIN HCL) one by mouth two times a day with food  #62 x 3   Entered and Authorized by:   [name]  MD   Signed by:   [name]  MD on 04/01/2009   Method used:   Electronically to        [facility] (retail)       [phone]       [place], [place]  [address]       Ph: [phone]       Fax: [phone]   RxID:   [id number] MG TABS (SIMVASTATIN) 1 tab by mouth daily  #30 x 5   Entered and Authorized by:   [name]  MD   Signed by:   [name]  MD on 04/01/2009   Method used:   Electronically to        [facility] (retail)       [phone]       [place], [place]  [address]       Ph: [phone]       Fax: [phone]   RxID:   [id number] ZESTORETIC 20-12.5 MG TABS (LISINOPRIL-HYDROCHLOROTHIAZIDE) take one by mouth daily  #32 x 5   Entered and Authorized by:   [name]  MD   Signed by:   [name]  MD on 04/01/2009   Method used:    Electronically to        [facility].* (retail)       [phone]       [place], [place]  [address]       Ph: [phone]       Fax: [phone]   RxID:   [id number]

## 2010-07-17 NOTE — Assessment & Plan Note (Signed)
Summary: cpe/pap,tcb   Vital Signs:  Patient profile:   40 year old female Height:      61 inches Weight:      158.9 pounds Pulse rate:   90 / minute BP sitting:   122 / 78  (left arm) Cuff size:   regular  Vitals Entered By: Mauricia Area CMA, (December 25, 2009 2:01 PM) CC: physical and pap. f/up DM Is Patient Diabetic? Yes Pain Assessment Patient in pain? no        Primary Care Provider:  Shanda Howells MD  CC:  physical and pap. f/up DM.  History of Present Illness: 35 YOF w/ PMHx/o DM, HTN, HLD here for annual physical and new doctor visit.   DM: Pt recently restarted anti-diabetic meds (metforminand glimiperide). Pt reports recurrent nausea and vomiting since restaring meds. Taking blood sugars once daily; usually in 250s per pt. No polyuria, polydypsia, vision changes, CP, SOB, reflux type sxs. Emesis improved since clinic visit 11/2009. However still persistent, even with taknig metformin w/ meals. Pt reports initial resolving GI sxs w/ metformin initially starting meds years ago which resolved. No fevers,, rashes, sick contacts. HbA1Cs all >14 since 2008. Pt regularly seeing optometrist and podiatrist per pt. pt interested in referall to endocrinologist.   HTN: Pt denies checking blood pressures regularly. Otherwise asymptomatic.   HLD: Supposed to be on zocor. Just restarted statin per pt. No myalgias.   Habits & Providers  Alcohol-Tobacco-Diet     Tobacco Status: never  Allergies: No Known Drug Allergies  Family History: Reviewed history from 02/13/2009 and no changes required. no CAD.  +DM BRCA in 2 aunts Mom L 68 HTN Dad L. 62 HTN, PUD, CABG, used to drink a lot of ETOH Brother Died 02/16/2008 Obesity, CHF, DM  Social History: Reviewed history from 02/13/2009 and no changes required. single, No tobacco, EtOH, or drugs.  Employed working Economist.  Received bachelor's at [employer] in accounting.  Going to go back to school for medical technology.  Currently in a 3 month  relationship.  No kids.  Review of Systems       negative except as noted above in HPI  Physical Exam  General:  alert, appropriate dress, and overweight-appearing.   Head:  normocephalic and atraumatic.   Eyes:  vision grossly intact.   Nose:  no external deformity.   Mouth:  good dentition.   Neck:  supple and full ROM.   Lungs:  normal respiratory effort and no wheezes.   Heart:  normal rate, regular rhythm, and no murmur.   Abdomen:  S/NT/+bowel sounds Genitalia:  normal introitus, no external lesions, no vaginal or cervical lesions, and no adnexal masses or tenderness.   Extremities:  2+ peripheral pulses. Neurologic:  alert & oriented X3, cranial nerves II-XII intact, strength normal in all extremities, and DTRs symmetrical and normal.     Impression & Recommendations:  Problem # 1:  DIABETES MELLITUS II, UNCOMPLICATED (XX123456) HbA1C @ 11 today. Much improved from previous. No ketotic sxs currently. Plan to re-titrate metformin in setting of nausea/emesis-likely secondary to metformin. Pt will likely adjunctive insulin in near future. However, pt w/ known hx/o of chronic non-complinace. Pt states that since her older brother (33 year older) died of obesity and DM, she feels more compelled to do the right thing. Pt refered for diabetic teaching and nutrition counseling, as well as referral to endocrinologist as pt desires. Pt up to date on diabetic eye a nd foot exams per tp. Plan to  attempt to obtain medical records. Otherwise followup in 2-3 months. Pt agreeable.  Her updated medication list for this problem includes:    Bayer Childrens Aspirin 81 Mg Chew (Aspirin) .Marland Kitchen... Take 1 tablet by mouth once a day    Zestoretic 20-25 Mg Tabs (Lisinopril-hydrochlorothiazide) ..... One by mouth daily for blood pressure    Glimepiride 2 Mg Tabs (Glimepiride) .Marland Kitchen... Take one daily    Metformin Hcl 1000 Mg Tabs (Metformin hcl) ..... One by mouth two times a day with food for diabetes     Byetta 5 Mcg Pen 5 Mcg/0.47ml Soln (Exenatide) ..... One injection two times a day within 1 hour before a meal.  qs 1 month  Orders: A1C-[facility] [id number]) Basic Met-[facility] [id number]) Endocrinology Referral (Endocrine) [place]- Est  Level 4 [id number])  Problem # 2:  HYPERTENSION, BENIGN SYSTEMIC (ICD-401.1) BP at goal. Plan to obtain renal labs to assess kidney function.  Her updated medication list for this problem includes:    Zestoretic 20-25 Mg Tabs (Lisinopril-hydrochlorothiazide) ..... One by mouth daily for blood pressure    Amlodipine Besylate 5 Mg Tabs (Amlodipine besylate) .Marland Kitchen... Take one daily in am for blood pressure  Orders: Basic Met-[facility] [id number]) [place]- Est  Level 4 [id number])  Problem # 3:  HYPERLIPIDEMIA (ICD-272.4) Not due for FLP. BAseline LFTs already obtained. Plan to possibly obtain LFTs at next clinical visit.  Her updated medication list for this problem includes:    Zocor 40 Mg Tabs (Simvastatin) .Marland Kitchen... 1 tab by mouth daily  Orders: Endocrinology Referral (Endocrine)  Problem # 4:  SCREENING FOR MALIGNANT NEOPLASM OF THE CERVIX (ICD-V76.2) Pap performed today. Still menstruating.  Orders: Pap Smear-[facility] [id number]) [place]- Est  Level 4 [id number])  Problem # 5:  FIBROADENOSIS, BREAST (ICD-610.2) + Fam hx/o BRCA. Will obtain mammogram for screening. No noted lumps per pt.  Orders: Mammogram (Screening) (Mammo) Mammogram (Diagnostic) (Mammo)  Complete Medication List: 1)  Bayer Childrens Aspirin 81 Mg Chew (Aspirin) .... Take 1 tablet by mouth once a day 2)  Zestoretic 20-25 Mg Tabs (Lisinopril-hydrochlorothiazide) .... One by mouth daily for blood pressure 3)  Zocor 40 Mg Tabs (Simvastatin) .Marland Kitchen.. 1 tab by mouth daily 4)  Glimepiride 2 Mg Tabs (Glimepiride) .... Take one daily 5)  Metformin Hcl 1000 Mg Tabs (Metformin hcl) .... One by mouth two times a day with food for diabetes 6)  Onetouch Lancets Misc (Lancets) .... Qs 1 month. 7)  Blood Sugar Strips  .... Use as  directed - generic equivalent, qs 1 mo 8)  Amlodipine Besylate 5 Mg Tabs (Amlodipine besylate) .... Take one daily in am for blood pressure 9)  Byetta 5 Mcg Pen 5 Mcg/0.44ml Soln (Exenatide) .... One injection two times a day within 1 hour before a meal.  qs 1 month  Patient Instructions: 1)  It was a pleasure meeting you today.  2)  Your HbA1C is down to 11.2 today!!! 3)  Try slowly going up on your metformin(eating w/ meals)-1 500 mg tablet daily for 1 week; 1 500 mg tablet twice daily for 1 week; then 2 500 mg tablets twice dailly thereafter. 4)  If your nausea is still persistent after that we may need to further look into it.  5)  We are going to check your kidney function today 6)  Your blood pressure is at goal!!! 7)  We will refer you to an endocrinologist, as well as a diabetic educator and dietician. 8)  Try cutting the amount of carbohydrates your eating in half.  9)  If you have any other questions, please feel free to give [place] a call 10)  God Bless, 11)  Shanda Howells MD   Prevention & Chronic Care Immunizations   Influenza vaccine: given  (04/26/2009)   Influenza vaccine due: 02/15/2010    Tetanus booster: 01/11/2009: Td   Tetanus booster due: 01/12/2019    Pneumococcal vaccine: Not documented  Other Screening   Pap smear: NEGATIVE FOR INTRAEPITHELIAL LESIONS OR MALIGNANCY.  (12/21/2008)   Pap smear due: 12/27/2009    Mammogram: Done.  (10/15/2004)   Mammogram due: 10/15/2005   Smoking status: never  (12/25/2009)  Diabetes Mellitus   HgbA1C: 11.4  (12/25/2009)   Hemoglobin A1C due: 11/03/2007    Eye exam: Not documented    Foot exam: yes  (11/29/2009)   Foot exam action/deferral: Do today   High risk foot: Not documented   Foot care education: Not documented   Foot exam due: 12/26/2010    Urine microalbumin/creatinine ratio: Not documented    Diabetes flowsheet reviewed?: Yes   Progress toward A1C goal: Unchanged  Lipids   Total Cholesterol: Not  documented   Lipid panel action/deferral: Deferred   LDL: 233  (08/13/2007)   LDL Direct: 220  (03/31/2009)   HDL: 58  (03/31/2009)   Triglycerides: 228  (03/31/2009)   Lipid panel due: 08/16/2009    SGOT (AST): 10  (03/31/2009)   BMP action: Deferred   SGPT (ALT): 11  (03/31/2009)   Alkaline phosphatase: 57  (03/31/2009)   Total bilirubin: 0.3  (03/31/2009)   Liver panel due: 05/16/2009  Hypertension   Last Blood Pressure: 122 / 78  (12/25/2009)   Serum creatinine: 1.14  (04/28/2009)   BMP action: Deferred   Serum potassium 4.6  (123456)   Basic metabolic panel due: 123XX123    Hypertension flowsheet reviewed?: Yes   Progress toward BP goal: At goal  Self-Management Support :   Personal Goals (by the next clinic visit) :     Personal A1C goal: 10  (02/13/2009)     Personal blood pressure goal: 140/90  (02/13/2009)     Personal LDL goal: 100  (02/13/2009)    Diabetes self-management support: Written self-care plan, Education handout, Referred for DM self-management training  (12/25/2009)   Diabetes care plan printed   Diabetes education handout printed    Hypertension self-management support: Written self-care plan  (12/25/2009)   Hypertension self-care plan printed.    Lipid self-management support: Written self-care plan  (12/25/2009)   Lipid self-care plan printed.   Nursing Instructions: Refer for diabetes self-management training (see order)    Diabetes Self Management Training Referral Patient Name: [name] Date Of Birth: [birth year] MRN: [id number] Current Diagnosis:  SCREENING FOR MALIGNANT NEOPLASM OF THE CERVIX (ICD-V76.2) VOMITING (ICD-787.03) HYPERTENSION, BENIGN SYSTEMIC (ICD-401.1) HYPERLIPIDEMIA (ICD-272.4) DIABETES MELLITUS II, UNCOMPLICATED (XX123456) ROUTINE GENERAL MEDICAL EXAM@HEALTH  CARE FACL (ICD-V70.0) GOUT (ICD-274.9) IRREGULAR MENSES (ICD-626.4) SCREENING FOR MALIGNANT NEOPLASM OF THE CERVIX (ICD-V76.2) SCREENING FOR  MALIGNANT NEOPLASM OF THE CERVIX (ICD-V76.2) SEXUALLY TRANSMITTED DISEASE, EXPOSURE TO (ICD-V01.6) OBESITY, NOS (ICD-278.00) MENSTRUAL CYCLE, IRREGULAR (ICD-626.4) FIBROADENOSIS, BREAST (ICD-610.2)   Laboratory Results   Blood Tests   Date/Time Received: December 25, 2009 2:04 PM  Date/Time Reported: December 25, 2009 2:29 PM   HGBA1C: 11.4%   (Normal Range: Non-Diabetic - 3-6%   Control Diabetic - 6-8%)  Comments: ...........test performed by...........Marland KitchenHedy Camara, CMA

## 2010-07-17 NOTE — Letter (Signed)
Summary: Generic Letter  [facility]  [address]   [place], [place] [address]   Phone: [phone]  Fax: [phone]    08/22/2007  [facility] [place], [place]  [address]  Dear Ms. Seubert,  I thought I would contact you by letter since we have been playing phone tag back and forth.  I wanted to let you know outside of your blood sugar and bad cholesterol being elevated, your other labs are normal.  We had started a medication for your cholesterol back in July and I will send this medication to your pharmacy now.  Your bad cholesterol was 233!!  It should be less than 100.  You are at a great risk of heart attack, stroke, having to be on dialysis, blindness, and needing an amputation down the line if we do not get your blood sugar and cholesterol under control.  Please keep your appointment with me in a couple weeks.  Sincerely,   Karlton Lemon MD [facility]  Appended Document: Generic Letter letter sent via mail to pt ..................Marland KitchenDelores Pate-Gaddy, CMA (AAMA)

## 2010-07-17 NOTE — Miscellaneous (Signed)
Summary: needs alternative meter & supplies  Clinical Lists Changes [facility] does not have this meter & has never heard of it. they ask that you re-send with a common brand such as prodigy- [phone]. order meter & all supplies & how often she tests.Joy Radon RN  September 22, 2009 5:06 PM  In prescription says "generic equivalent"  called and clarified. [name]  MD  September 22, 2009 5:12 PM

## 2010-07-17 NOTE — Letter (Signed)
Summary: Results Follow-up Letter  [facility]  [address]   [place], [place] [address]   Phone: [phone]  Fax: [phone]    12/27/2008  [facility] [place], [address], [place]  [address]  Dear Ms. Bachar,   The following are the results of your recent test(s):  Test     Result     Pap Smear    Normal____X___  Not Normal_____       Comments: repeat screen in 1 year.  You also will need to start getting mammograms next year when you turn 39 given your family history. _________________________________________________________  Other Tests:  Your thyroid function was normal.  Your chlamydia and gonorrhea tests were normal.  Upon doing the pap, another vaginal infection called trichomonas was noted.  I[facility] to [facility].  Please give our clinic a call if you have any questions, and to update your phone number as I'm not sure if we have your right number. _________________________________________________________  Please call for an appointment Or _________________________________________________________ _________________________________________________________ _________________________________________________________  Sincerely,  [name]  MD [place]

## 2010-07-17 NOTE — Assessment & Plan Note (Signed)
Summary: f/u dm,df    Vital Signs:  Patient profile:   40 year old female Height:      61 inches Weight:      163 pounds BMI:     30.91 Temp:     98.3 degrees F oral Pulse rate:   100 / minute BP sitting:   182 / 102  (left arm) Cuff size:   regular  Vitals Entered By: Schuyler Amor CMA (September 22, 2009 3:47 PM) CC: F/U DM Is Patient Diabetic? Yes Pain Assessment Patient in pain? no        Primary Care Provider:  [name]  MD  CC:  F/U DM.  History of Present Illness: CC: f/u DM, HTN  1. HTN - Takes meds at night.  states took HTN med last night.  2. DM - ate bacon this morning, juices throghout day.  Metformin BID and glimepiride daily.  Seeing podiatrist Dr. Geroge Baseman at [facility].  denies gi side effects from metformni.  3. HLD - taking zocor at night.  Asked if patient having trouble affording medicines, but pt states that they are all affordable.  Asked if there is any problem filling meds or going to pick them up, but patient assures me that she takes all her medicines as prescribed.  Called pharmacy and pt has not filled glimepiride, metformin, or zestoretic since 04/28/2009.  Habits & Providers  Alcohol-Tobacco-Diet     Tobacco Status: never  Current Medications (verified): 1)  Bayer Childrens Aspirin 81 Mg Chew (Aspirin) .... Take 1 Tablet By Mouth Once A Day 2)  Zestoretic 20-25 Mg Tabs (Lisinopril-Hydrochlorothiazide) .... One By Mouth Daily For Blood Pressure 3)  Zocor 40 Mg Tabs (Simvastatin) .Marland Kitchen.. 1 Tab By Mouth Daily 4)  Glimepiride 2 Mg Tabs (Glimepiride) .... Take One Daily 5)  Metformin Hcl 1000 Mg Tabs (Metformin Hcl) .... One By Mouth Two Times A Day With Food For Diabetes 6)  Exactech Test  Strp (Glucose Blood) .... Use Daily As Directed - Generic Equivalent 7)  Lancets  Misc (Lancets) .... Use As Directed - Generic Equivalent 8)  Amlodipine Besylate 5 Mg Tabs (Amlodipine Besylate) .... Take One Daily in Am For Blood  Pressure  Allergies (verified): No Known Drug Allergies  Past History:  Past medical, surgical, family and social histories (including risk factors) reviewed for relevance to current acute and chronic problems.  Past Medical History: Reviewed history from 01/02/2007 and no changes required. HTN DM T2 Obesity h/o CIN1 in 2004 Diagnosed with HSV type 2  6/04 h/o chlamydia 1994  Past Surgical History: Reviewed history from 02/13/2009 and no changes required. Lasik eye surgery - 01/21/2005  Family History: Reviewed history from 02/13/2009 and no changes required. no CAD.  +DM BRCA in 2 aunts Mom L 30 HTN Dad L. 62 HTN, PUD, CABG, used to drink a lot of ETOH Brother Died 29-Feb-2008 Obesity, CHF, DM  Social History: Reviewed history from 02/13/2009 and no changes required. single, No tobacco, EtOH, or drugs.  Employed working at [employer].  Received bachelor's at [employer] in accounting.  Going to go back to school for medical technology.  Currently in a 3 month relationship.  No kids.  Physical Exam  General:  Well-developed,well-nourished,in no acute distress; alert,appropriate and cooperative throughout examination Lungs:  Normal respiratory effort, chest expands symmetrically. Lungs are clear to auscultation, no crackles or wheezes. Heart:  Normal rate and regular rhythm. S1 and S2 normal without gallop, murmur, click, rub or other extra  sounds.  tachycardic   Impression & Recommendations:  Problem # 1:  HYPERTENSION, BENIGN SYSTEMIC (ICD-401.1)  increase HCTZ component.  Add amlodipine.  pt with noncompliance. Her updated medication list for this problem includes:    Zestoretic 20-25 Mg Tabs (Lisinopril-hydrochlorothiazide) ..... One by mouth daily for blood pressure    Amlodipine Besylate 5 Mg Tabs (Amlodipine besylate) .Marland Kitchen... Take one daily in am for blood pressure  BP today: 182/102 Prior BP: 129/86 (04/28/2009)  Labs Reviewed: K+: 4.6 (04/28/2009) Creat: : 1.14  (04/28/2009)   HDL: 58 (03/31/2009)   LDL: 233 (08/13/2007)   TG: 228 (03/31/2009)  Orders: [facility]- Est  Level 4 [id number])  Problem # 2:  DIABETES MELLITUS II, UNCOMPLICATED ([id number]) pt states she will be going to endocrinologist.  noncompliant.  increase metformin slowly to 1000mg  two times a day.  next step will be increase glimepiride vs change to insulin. Her updated medication list for this problem includes:    Bayer Childrens Aspirin 81 Mg Chew (Aspirin) .Marland Kitchen... Take 1 tablet by mouth once a day    Zestoretic 20-25 Mg Tabs (Lisinopril-hydrochlorothiazide) ..... One by mouth daily for blood pressure    Glimepiride 2 Mg Tabs (Glimepiride) .Marland Kitchen... Take one daily    Metformin Hcl 1000 Mg Tabs (Metformin hcl) ..... One by mouth two times a day with food for diabetes  Orders: A1C-[facility] [id number]) [facility]- Est  Level 4 [id number])  Labs Reviewed: Creat: 1.14 (04/28/2009)   Microalbumin: >2+   reflex to regular dipstick (01/11/2009) Reviewed HgBA1c results: >14.0 (09/22/2009)  >14.0 (03/31/2009)  Problem # 3:  HYPERLIPIDEMIA (ICD-272.4)  continue zocor. Her updated medication list for this problem includes:    Zocor 40 Mg Tabs (Simvastatin) .Marland Kitchen... 1 tab by mouth daily  Labs Reviewed: SGOT: 10 (03/31/2009)   SGPT: 11 (03/31/2009)   HDL:58 (03/31/2009)  LDL:233 (08/13/2007)  Trig:228 (03/31/2009)  Orders: [place]- Est  Level 4 [id number])  Complete Medication List: 1)  Bayer Childrens Aspirin 81 Mg Chew (Aspirin) .... Take 1 tablet by mouth once a day 2)  Zestoretic 20-25 Mg Tabs (Lisinopril-hydrochlorothiazide) .... One by mouth daily for blood pressure 3)  Zocor 40 Mg Tabs (Simvastatin) .Marland Kitchen.. 1 tab by mouth daily 4)  Glimepiride 2 Mg Tabs (Glimepiride) .... Take one daily 5)  Metformin Hcl 1000 Mg Tabs (Metformin hcl) .... One by mouth two times a day with food for diabetes 6)  Onetouch Lancets Misc (Lancets) .... Qs 1 month. 7)  Blood Sugar Strips  .... Use as directed - generic equivalent, qs 1 mo 8)   Amlodipine Besylate 5 Mg Tabs (Amlodipine besylate) .... Take one daily in am for blood pressure  Patient Instructions: 1)  I'd like to see you back here in 1 mo. 2)  Your blood pressure is too high :( 3)  New blood pressure medicine amlodipine 5mg  daily 4)  New blood pressure combo lisinopril 20/HCTZ 25 5)  Increase Metformin 500mg  to 1 pill in morning, and 2 pills at night for 1 week then 2 pills in morning and 2 pills at night.  (new prescription will have 1000mg  twice daily) 6)  f/u with podiatrist and let me know what he decides to do.  F/u with endocrinologist Dr. Oda Cogan and we will be happy to fax records over  7)  Good to see you today! Prescriptions: BLOOD SUGAR STRIPS use as directed - generic equivalent, QS 1 mo  #1 x 11   Entered and Authorized by:   [name]  MD  Signed by:   [name]  MD on 09/22/2009   Method used:   Historical   [id number] ONETOUCH LANCETS  MISC (LANCETS) qs 1 month.  #1 x 11   Entered and Authorized by:   [name]  MD   Signed by:   [name]  MD on 09/22/2009   Method used:   Electronically to        [facility].* (retail)       [phone]       [place], [place]  [address]       Ph: [phone]       Fax: [phone]   [id number] LANCETS  MISC (LANCETS) use as directed - generic equivalent  #1 x 11   Entered and Authorized by:   [name]  MD   Signed by:   [name]  MD on 09/22/2009   Method used:   Electronically to        [facility].* (retail)       [phone]       [place], [place]  [address]       Ph: [phone]       Fax: [phone]   RxID:   [id number] EXACTECH TEST  STRP (GLUCOSE BLOOD) use daily as directed - generic equivalent  #1 x 11   Entered and Authorized by:   [name]  MD   Signed by:   [name]  MD on 09/22/2009   Method used:    Electronically to        [facility].* (retail)       [phone]       [place], [place]  [address]       Ph: [phone]       Fax: [phone]   [id number] GLIMEPIRIDE 2 MG TABS (GLIMEPIRIDE) take one daily  #31 x 3   Entered and Authorized by:   [name]  MD   Signed by:   [name]  MD on 09/22/2009   Method used:   Electronically to        [facility].* (retail)       [phone]       [place], [place]  [address]       Ph: [phone]       Fax: [phone]   RxID:   [id number] MG TABS (SIMVASTATIN) 1 tab by mouth daily  #30 x 3   Entered and Authorized by:   [name]  MD   Signed by:   [name]  MD on 09/22/2009   Method used:   Electronically to        [facility].* (retail)       [phone]       [place], [place]  [address]       Ph: [phone]       Fax: [phone]   RxID:   [id number] ZESTORETIC 20-25 MG TABS (LISINOPRIL-HYDROCHLOROTHIAZIDE) one by mouth daily for blood pressure  #30 x 3   Entered and Authorized by:   [name]  MD   Signed by:   [name]  MD on 09/22/2009   Method used:   Electronically to        [facility].* (retail)       [phone]       [place], [place]  [address]       Ph: [phone]       Fax: [phone]   RxID:   [id number] METFORMIN HCL 1000 MG TABS (METFORMIN HCL) one by mouth two times a day with food for diabetes  #60 x 3   Entered and Authorized by:   [name]  MD   Signed by:   [name]  MD on 09/22/2009   Method used:   Electronically to        [facility].* (retail)       [phone]       [place], [place]  [address]       Ph: [phone]       Fax: [phone]   [id number] AMLODIPINE  BESYLATE 5 MG TABS (AMLODIPINE BESYLATE) take one daily in AM for blood pressure  #30 x 3   Entered and Authorized by:   [name]  MD   Signed by:   [name]  MD on 09/22/2009   Method used:   Electronically to        [facility].* (retail)       [phone]       [place], [place]  [address]       Ph: [phone]       Fax: [phone]   RxID:   [id number]   Laboratory Results   Blood Tests   Date/Time Received: September 22, 2009 3:51 PM  Date/Time Reported: September 22, 2009 4:17 PM   HGBA1C: >14.0%   (Normal Range: Non-Diabetic - 3-6%   Control Diabetic - 6-8%)  Comments: ...........test performed by...........Marland KitchenHedy Camara, CMA      Prevention & Chronic Care Immunizations   Influenza vaccine: given  (04/26/2009)   Influenza vaccine due: 04/17/2008    Tetanus booster: 01/11/2009: Td   Tetanus booster due: 06/17/2008    Pneumococcal vaccine: Not documented  Other Screening   Pap smear: NEGATIVE FOR INTRAEPITHELIAL LESIONS OR MALIGNANCY.  (12/21/2008)   Pap smear due: 12/27/2009   Smoking status: never  (09/22/2009)  Diabetes Mellitus   HgbA1C: >14.0  (09/22/2009)   Hemoglobin A1C due: 11/03/2007    Eye exam: Not documented    Foot exam: yes  (03/31/2009)   Foot exam action/deferral: Do today   High risk foot: Not documented   Foot care education: Not documented   Foot exam due: 07/01/2009    Urine microalbumin/creatinine ratio: Not documented    Diabetes flowsheet reviewed?: Yes   Progress toward A1C goal: Unchanged  Lipids   Total Cholesterol: Not documented   Lipid panel action/deferral: Deferred   LDL: 233  (08/13/2007)   LDL Direct: 220  (03/31/2009)   HDL: 58  (03/31/2009)   Triglycerides: 228  (03/31/2009)   Lipid panel due: 08/16/2009    SGOT (AST): 10  (03/31/2009)   BMP action: Deferred   SGPT (ALT): 11  (03/31/2009)   Alkaline phosphatase: 57  (03/31/2009)   Total  bilirubin: 0.3  (03/31/2009)   Liver panel due: 05/16/2009    Lipid flowsheet reviewed?: Yes   Progress  toward LDL goal: Unchanged  Hypertension   Last Blood Pressure: 182 / 102  (09/22/2009)   Serum creatinine: 1.14  (04/28/2009)   BMP action: Deferred   Serum potassium 4.6  (123456)   Basic metabolic panel due: 123XX123    Hypertension flowsheet reviewed?: Yes   Progress toward BP goal: Deteriorated  Self-Management Support :   Personal Goals (by the next clinic visit) :     Personal A1C goal: 10  (02/13/2009)     Personal blood pressure goal: 140/90  (02/13/2009)     Personal LDL goal: 100  (02/13/2009)    Diabetes self-management support: CBG self-monitoring log, Written self-care plan, Referred for self-management class, Referred for medical nutrition therapy  (03/31/2009)    Hypertension self-management support: BP self-monitoring log, Written self-care plan, Referred for medical nutrition therapy  (03/31/2009)    Lipid self-management support: Written self-care plan, Education handout, Referred for medical nutrition therapy  (03/31/2009)

## 2010-07-17 NOTE — Assessment & Plan Note (Signed)
Summary: F/U VISIT/[facility]   Vital Signs:  Patient Profile:   40 Years Old Female Height:     61 inches (154.94 cm) Weight:      181 pounds BMI:     34.32 Temp:     98.3 degrees F Pulse rate:   109 / minute BP sitting:   172 / 98  Pt. in pain?   no  Vitals Entered By: Elige Radon RN (September 02, 2007 4:00 PM)              Is Patient Diabetic? Yes      PCP:  Karlton Lemon MD  Chief Complaint:  recheck DM  and Meds & HTN.  History of Present Illness: Diabetes Mellitus Meds: metformin 500 two times a day, glipizide xl 20mg  daily, byetta 5 two times a day, asa 81 Taking and tolerating? yes Blood sugars: 253-330s Hypoglycemic symptoms:no Visual problems:no Monitoring feet:yes Numbness/Tingling:no Last eye exam:due and has name of ophthalmologist Microalbumin: no A1c: 14.0 (08/06/2007 9:54:35 AM) Flu shot: yes Pneumonia  today  Hypertension Meds:none Taking and tolerating?n/a Home [id number] Complaints: none Repeat manual BP: 134/88  Flu Vaccine Result Date:  04/18/2007 Flu Vaccine Result:  given LDL Result Date:  08/13/2007 LDL Result:  233 LDL Next Due:  12 wk Last PAP:  Normal (08/13/2007 1:53:48 PM) PAP Result Date:  08/13/2007 PAP Result:  normal    Updated Prior Medication List: BAYER CHILDRENS ASPIRIN 81 MG CHEW (ASPIRIN) Take 1 tablet by mouth once a day BYETTA 5 MCG PEN 5 MCG/0.02ML SOLN (EXENATIDE) Inject 5 mcg subcutaneously twice a day GLIPIZIDE XL 10 MG TB24 (GLIPIZIDE) 2 tabs by mouth daily METFORMIN HCL 500 MG  TABS (METFORMIN HCL) 1 tab by mouth two times a day  Current Allergies (reviewed today): No known allergies   Past Medical History:    Reviewed history from 01/02/2007 and no changes required:       HTN       DM T2       Obesity       h/o CIN1 in 2004       Diagnosed with HSV type 2  6/04       h/o chlamydia 1994    Risk Factors:  PAP Smear History:     Date of Last PAP Smear:  08/13/2007   PAP Smear History:     Date  of Last PAP Smear:  08/13/2007    Results:  normal     Physical Exam  General:     Gen: Well-developed, well-nourished, NAD, alert and cooperative HEENT: NCAT, PERRL, EOMI, no conjunctival inflammation, pharynx nl without erythema or exudates, MMM. External ear exam nl - TMs without bulging or erythema.  No nasal d/c Neck: No LAN, thyromegaly, masses, bruits CV: RRR, no MRG Lungs: Nl resp effort. CTAB no wheezes, rales, rhonchi Ext: warm, well perfused.  No edema or cyanosis Skin: No rashes or lesions on visible skin    Impression & Recommendations:  Problem # 1:  DIABETES MELLITUS II, UNCOMPLICATED ([id number]) Assessment: Improved CBGs reportedly better than last couple times when they were in the 400s.  Discussed that she will likely need insulin but will add actos to current regimen for now.  She has tried high dose glucophage in past and did not tolerate due to diarrhea.  Will f/u in 1 month.  Discussed need to either document or bring meter with her to visits.  Refilled lancets and test strips for Contour meter.  Will make ophtho  appt.  Pneumonia vaccine given.  Her updated medication list for this problem includes:    Bayer Childrens Aspirin 81 Mg Chew (Aspirin) .Marland Kitchen... Take 1 tablet by mouth once a day    Byetta 5 Mcg Pen 5 Mcg/0.23ml Soln (Exenatide) ..... Inject 5 mcg subcutaneously twice a day    Zestoretic 20-25 Mg Tabs (Lisinopril-hydrochlorothiazide) .Marland Kitchen... Take 1 tablet by mouth once a day    Glipizide Xl 10 Mg Tb24 (Glipizide) .Marland Kitchen... 2 tabs by mouth daily    Metformin Hcl 500 Mg Tabs (Metformin hcl) .Marland Kitchen... 1 tab by mouth two times a day    Actos 15 Mg Tabs (Pioglitazone hcl) .Marland Kitchen... 1 tab by mouth daily  Orders: [place]- Est  Level 4 [id number])   Problem # 2:  HYPERTENSION, BENIGN SYSTEMIC (ICD-401.1) Assessment: Deteriorated Has not been taking lisinopril/hctz.  Will fill.  Repeat manual bp was elevated above goal 130/80.  Her updated medication list for this problem  includes:    Zestoretic 20-25 Mg Tabs (Lisinopril-hydrochlorothiazide) .Marland Kitchen... Take 1 tablet by mouth once a day  Orders: [facility]- Est  Level 4 [id number])   Problem # 3:  HYPERLIPIDEMIA (ICD-272.4) Assessment: Unchanged Not taking zocor as was discussed previously.  LDL 233 last visit.   Her updated medication list for this problem includes:    Zocor 40 Mg Tabs (Simvastatin) .Marland Kitchen... 1 tab by mouth daily  Orders: [facility]- Est  Level 4 [id number])   Complete Medication List: 1)  Bayer Childrens Aspirin 81 Mg Chew (Aspirin) .... Take 1 tablet by mouth once a day 2)  Byetta 5 Mcg Pen 5 Mcg/0.56ml Soln (Exenatide) .... Inject 5 mcg subcutaneously twice a day 3)  Zestoretic 20-25 Mg Tabs (Lisinopril-hydrochlorothiazide) .... Take 1 tablet by mouth once a day 4)  Zocor 40 Mg Tabs (Simvastatin) .Marland Kitchen.. 1 tab by mouth daily 5)  Glipizide Xl 10 Mg Tb24 (Glipizide) .... 2 tabs by mouth daily 6)  Metformin Hcl 500 Mg Tabs (Metformin hcl) .Marland Kitchen.. 1 tab by mouth two times a day 7)  Actos 15 Mg Tabs (Pioglitazone hcl) .Marland Kitchen.. 1 tab by mouth daily  Other Orders: State-Pneumococcal Vaccine [id number]) Admin 1st Vaccine [id number])   Patient Instructions: 1)  Get the prescriptions filled for the Lancets and the glucose test strips. 2)  Keep up the good work with checking your blood sugars!  Write them down or bring your meter to your appointments. 3)  Call Dr. Ricki Miller for a follow-up eye appointment at [phone]. 4)  We will start Actos in addition to your other diabetes medications.  I have sent this to your pharmacy. 5)  This is the list of all the medications you should be taking. 6)  Follow up with me in 1 month. 7)  Keep in mind you may have to start insulin in the future.    Prescriptions: BYETTA 5 MCG PEN 5 MCG/0.02ML SOLN (EXENATIDE) Inject 5 mcg subcutaneously twice a day  #10 x 1   Entered and Authorized by:   Karlton Lemon MD   Signed by:   Karlton Lemon MD on 09/02/2007   Method used:   [facility]ron[facility] ...       [facility] [phone]*       [place]       [place], [place]  [address]       Ph: [phone] or [phone]       Fax: [phone]   RxID:   [id number]  METFORMIN HCL 500 MG  TABS (METFORMIN HCL) 1 tab by mouth two times a day  #60 x 2   Entered and Authorized by:   Karlton Lemon MD   Signed by:   Karlton Lemon MD on 09/02/2007   Method used:   Electronically sent to ...       [facility]  [facility] [phone]*       [place]       [place], [place]  [address]       Ph: [phone] or [phone]       Fax: [phone]   RxID:   [id number] GLIPIZIDE XL 10 MG TB24 (GLIPIZIDE) 2 tabs by mouth daily  #60 x 2   Entered and Authorized by:   Karlton Lemon MD   Signed by:   Karlton Lemon MD on 09/02/2007   Method used:   Electronically sent to ...       [facility]  [facility] [phone]*       [place]       [place], [place]  [address]       Ph: [phone] or [phone]       Fax: [phone]   RxID:   [id number] ZOCOR 40 MG TABS (SIMVASTATIN) 1 tab by mouth daily  #30 x 5   Entered and Authorized by:   Karlton Lemon MD   Signed by:   Karlton Lemon MD on 09/02/2007   Method used:   Electronically sent to ...       [facility]  [facility] [address] [phone]*       [place]       [place], [place]  [address]       Ph: [phone] or [phone]       Fax: [phone]   RxID:   [id number] ZESTORETIC 20-25 MG TABS (LISINOPRIL-HYDROCHLOROTHIAZIDE) Take 1 tablet by mouth once a day  #30 x 11   Entered and Authorized by:   Karlton Lemon MD   Signed by:   Karlton Lemon MD on 09/02/2007   Method used:   Electronically sent to ...       [facility]  [facility] [address] [phone]*       [place]       [place], [place]  [address]       Ph: [phone] or [phone]       Fax: [phone]   RxID:    [id number] ACTOS 15 MG  TABS (PIOGLITAZONE HCL) 1 tab by mouth daily  #30 x 5   Entered and Authorized by:   Karlton Lemon MD   Signed by:   Karlton Lemon MD on 09/02/2007   Method used:   Electronically sent to ...       [facility]  [facility] [address] [phone]*       [place]       [place], [place]  [address]       Ph: [phone] or [phone]       Fax: [phone]   RxID:   [id number]  ]  Pneumovax Vaccine    Vaccine Type: Pneumovax (State)    Site: left deltoid    Mfr: Merck    Dose: 0.5 ml    Route: IM    Given by: Elige Radon RN  Exp. Date: 11/13/2008    Lot #: [id number]    VIS given: 01/13/96 version given September 02, 2007.

## 2010-07-17 NOTE — Progress Notes (Signed)
Summary: RESULTS OF PREG TEST REQUESTED  pt returned call to see if phy called for results of preg test.   please call her on monday at [phone].  this is her mom's number and info can be left with her.  please to not call her home number and lv msg. ...................................................................BARBARA MCGREGOR  August 07, 2007 3:25 PM  Will contact patient at number above next week as asked with results.  ...................................................................[facility] HUDNALL MD  August 07, 2007 7:43 PM   Results given to mom and also informed her patient's cholesterol is elevated and need to start med for that.  She suggested calling her tomorrow at home number.  ...................................................................Karlton Lemon MD  August 10, 2007 4:23 PM   Was not home on 2/24 - patient had already left for work and husband answered phone. Not home again today - best time per husband is in mornings before 11:30.  ...................................................................[facility] HUDNALL MD  August 13, 2007 1:55 PM Noted patient was previously prescribed zocor but has not been taking.  Will send letter to initiate this with recheck in 3 months.   Appended Document: RESULTS OF PREG TEST REQUESTED    Clinical Lists Changes  Medications: Rx of ZOCOR 40 MG TABS (SIMVASTATIN) 1 tab by mouth daily;  #30 x 2;  Signed;  Entered by: Karlton Lemon MD;  Authorized by: Karlton Lemon MD;  Method used: Electronic    Prescriptions: ZOCOR 40 MG TABS (SIMVASTATIN) 1 tab by mouth daily  #30 x 2   Entered and Authorized by:   Karlton Lemon MD   Signed by:   Karlton Lemon MD on 08/22/2007   Method used:   Electronically sent to ...       [facility]. # [phone]*       [id number]  [address]       [place], [place]  [address]       Ph: [phone] or [phone]       Fax: [phone]   RxID:    [id number]

## 2010-07-17 NOTE — Progress Notes (Signed)
Summary: requesting leltter  Phone Note Call from Patient Call back at Home Phone [phone]   Reason for Call: Talk to Nurse Summary of Call: pt is requesting a letter excusing her to go to the bathroom at work b/c of her diabetes, fax to [phone] attn:Joann Ledford Initial call taken by: ERIN LEVAN,  June 23, 2007 4:39 PM  Follow-up for Phone Call        Patient would not need to use the restroom regularly if her diabetes was under control.  She was last seen 6 months ago with an a1c of 14.0 and needed to be seen again in 1-2 weeks!  Ask her to make an appointment and we will discuss note at that time. Follow-up by: Karlton Lemon MD,  June 23, 2007 7:23 PM  Additional Follow-up for Phone Call Additional follow up Details #1::        states she has an appt on the 16th. told her md would discussnote at that visit Additional Follow-up by: Elige Radon RN,  June 24, 2007 11:08 AM    Additional Follow-up for Phone Call Additional follow up Details #2::    Thanks Gay Filler! Follow-up by: Karlton Lemon MD,  June 24, 2007 2:12 PM

## 2010-07-17 NOTE — Assessment & Plan Note (Signed)
Summary: f/u n&v/Livengood/Gutierrez   Vital Signs:  Patient profile:   40 year old female Height:      61 inches Weight:      156.7 pounds BMI:     29.72 Temp:     97.9 degrees F oral Pulse rate:   102 / minute BP sitting:   146 / 84  (left arm) Cuff size:   regular  Vitals Entered By: Levert Feinstein LPN (June 22, 624THL 579FGE AM) CC: n/v since yesterday Is Patient Diabetic? Yes Did you bring your meter with you today? Yes Pain Assessment Patient in pain? no        Primary Care Provider:  [name]  MD  CC:  n/v since yesterday.  History of Present Illness: 1) Nausea / emesis: Reports nausea / emesis (non bloody, non bilious) yesterday after she took her medications on an empty stomach. Reports that she had not taken her medications for about one year until restarting yesterday. Has history of diabetes, HTN, HLD for which she has restarted medications as below. Reports that she checked her blood glucose several times yesterday while she was nauseated - the lowest it has been is 152. Emesis occurred x 5 episodes, was associated with attempts to eat. No nausea or emesis this morning. Has not eaten anything or taken her medications. Reports some generalized weakness and mild generalized abdominal pain which have resolved. LMP ended yesterday.   Denies diarrhea, constipation, melena, hematochezia, hematemesis, URI symptoms, chest pain, dyspnea, vision change, headache.   Habits & Providers  Alcohol-Tobacco-Diet     Tobacco Status: never  Current Medications (verified): 1)  Bayer Childrens Aspirin 81 Mg Chew (Aspirin) .... Take 1 Tablet By Mouth Once A Day 2)  Zestoretic 20-25 Mg Tabs (Lisinopril-Hydrochlorothiazide) .... One By Mouth Daily For Blood Pressure 3)  Zocor 40 Mg Tabs (Simvastatin) .Marland Kitchen.. 1 Tab By Mouth Daily 4)  Glimepiride 2 Mg Tabs (Glimepiride) .... Take One Daily 5)  Metformin Hcl 1000 Mg Tabs (Metformin Hcl) .... One By Mouth Two Times A Day With Food For  Diabetes 6)  Onetouch Lancets  Misc (Lancets) .... Qs 1 Month. 7)  Blood Sugar Strips .... Use As Directed - Generic Equivalent, Qs 1 Mo 8)  Amlodipine Besylate 5 Mg Tabs (Amlodipine Besylate) .... Take One Daily in Am For Blood Pressure 9)  Byetta 5 Mcg Pen 5 Mcg/0.38ml Soln (Exenatide) .... One Injection Two Times A Day Within 1 Hour Before A Meal.  Qs 1 Month  Allergies (verified): No Known Drug Allergies  Review of Systems       above   Physical Exam  General:  vitals reviewed.  NAD, alert, obese  Mouth:  moist membranes  Lungs:  CTAB  Heart:  RRR w/o murmurs  Abdomen:  obese, soft, non tender, non distended. +bowel sounds  Pulses:  2+ radials.    Impression & Recommendations:  Problem # 1:  VOMITING (ICD-787.03) Assessment New  Likely secondary to taking metformin on empty stomach. Advised patient to take this medication with food. No red flags on history or exam. No signs hypoglycemia. CBG today 183. Follow up with new PCP in three weeks.  Orders: [facility]- Est Level  3 [id number])  Complete Medication List: 1)  Bayer Childrens Aspirin 81 Mg Chew (Aspirin) .... Take 1 tablet by mouth once a day 2)  Zestoretic 20-25 Mg Tabs (Lisinopril-hydrochlorothiazide) .... One by mouth daily for blood pressure 3)  Zocor 40 Mg Tabs (Simvastatin) .Marland Kitchen.. 1 tab by mouth daily  4)  Glimepiride 2 Mg Tabs (Glimepiride) .... Take one daily 5)  Metformin Hcl 1000 Mg Tabs (Metformin hcl) .... One by mouth two times a day with food for diabetes 6)  Onetouch Lancets Misc (Lancets) .... Qs 1 month. 7)  Blood Sugar Strips  .... Use as directed - generic equivalent, qs 1 mo 8)  Amlodipine Besylate 5 Mg Tabs (Amlodipine besylate) .... Take one daily in am for blood pressure 9)  Byetta 5 Mcg Pen 5 Mcg/0.87ml Soln (Exenatide) .... One injection two times a day within 1 hour before a meal.  qs 1 month  Other Orders: Glucose Cap-[facility] [id number])

## 2010-07-17 NOTE — Progress Notes (Signed)
Summary: triage  Phone Note Call from Patient Call back at Home Phone [phone]   Caller: Patient Summary of Call: n/v - bs is not normal... Initial call taken by: Audie Clear,  December 05, 2009 2:19 PM  Follow-up for Phone Call        started vomiting today. cbg was 292 before food or fluids this am. 141 now. eating crackers but vomits. last try at food or fluids was at 1:30. told her at 2:30, take a small sip of fluid.  if able to tolerate this, take another every 5 minutes. the nausea & vomiting will go away. when better make try crackers again.  usually tests her sugars twice a day. made her an appt at 8:30 tomorrow for f/u told her we have an md on call when we are closed if she has further need of advice or questions Follow-up by: Elige Radon RN,  December 05, 2009 2:20 PM  Additional Follow-up for Phone Call Additional follow up Details #1::        thanks. Additional Follow-up by: [name]  MD,  December 05, 2009 2:31 PM

## 2010-07-17 NOTE — Letter (Signed)
Summary: Results Follow-up Letter  [facility]  [address]   [place], [place] [address]   Phone: [phone]  Fax: [phone]    05/15/2009  [address], [address] [place] [place], [place]  [address]  Dear Ms. Grabel,   The following are the results of your recent test(s):  Test     Result      I hope you had a wonderful thanksgiving.  Glucose level was 285 - still high, but lower than previously.  I'm glad we started the second diabetes medication (glimeperide 2mg  daily).  Call us if you have any questions about your medication regimen (Metformin twice daily, glimeperide, zestoretic, baby aspirin, and zocor).  I hope you were able to attain a glucometer to start checking your sugars.  Your next appt with me is December 15th at 8:55am  Sincerely,  [name]  MD [place]

## 2010-07-17 NOTE — Miscellaneous (Signed)
Summary: Orders Update  Clinical Lists Changes  Problems: Added new problem of ENCOUNTER FOR LONG-TERM USE OF OTHER MEDICATIONS (ICD-V58.69) Orders: Added new Test order of B12[facility] [phone]) - Signed Added new Test order of CBC-[facility] [id number]) - Signed Ok per DR. Ernestina Patches

## 2010-07-17 NOTE — Assessment & Plan Note (Signed)
Summary: f/u last visit/eo   Vital Signs:  Patient profile:   40 year old female Height:      61 inches Weight:      161.7 pounds BMI:     30.66 Pulse rate:   92 / minute BP sitting:   129 / 86  (right arm)  Vitals Entered By: Mauricia Area CMA, (April 28, 2009 8:47 AM) CC: f/up HTN and DM Is Patient Diabetic? Yes Pain Assessment Patient in pain? no        Primary Care Provider:  [name]  MD  CC:  f/up HTN and DM.  History of Present Illness: CC: f/u DM, HTN  1. DM - taking metformin nightly.  (tried 2 pills nightly but had episode of severe diarrhea).  Not on ASA.  No glucometer yet.  states last eye exam was last november with Dr. Ricki Miller.  A1c >14% Apr 28, 2009.  states brother died last 2023/02/27 from CHF and diabetes complications (was one year older than Dominica).  Notes R foot numbness, tingling, at times painful.  Sent to diabetes ed. center, unsure if ever went.  2. HTN - taking zestoretic daily.  no HA, vision changes, chest pain, tightness, urinary changes, LE swelling.  BP today best to date 129/86.  3. HLD - on zocor 40mg  daily.    4. obesity - 165-->163 -> 161 lbs.    5. preventative - UTD.  mammo to start at 39 given fm hx.  Meds she is currently on: ASA once daily Zestoretic Zocor Metformin 500mg  QHS  Going to [place] next week for [place] for a week.  Habits & Providers  Alcohol-Tobacco-Diet     Tobacco Status: never  -  Date:  04/26/2009    Flu vaccine given  Current Medications (verified): 1)  Bayer Childrens Aspirin 81 Mg Chew (Aspirin) .... Take 1 Tablet By Mouth Once A Day 2)  Zestoretic 20-12.5 Mg Tabs (Lisinopril-Hydrochlorothiazide) .... Take One By Mouth Daily 3)  Zocor 40 Mg Tabs (Simvastatin) .Marland Kitchen.. 1 Tab By Mouth Daily 4)  Glimepiride 2 Mg Tabs (Glimepiride) .... Take One Daily 5)  Metformin Hcl 500 Mg  Tabs (Metformin Hcl) .... One By Mouth Two Times A Day With Food 6)  Exactech Test  Strp (Glucose Blood) .... Use Daily  As Directed - Generic Equivalent 7)  Lancets  Misc (Lancets) .... Use As Directed - Generic Equivalent  Allergies (verified): No Known Drug Allergies  Physical Exam  General:  Well-developed,well-nourished,in no acute distress; alert,appropriate and cooperative throughout examination Lungs:  Normal respiratory effort, chest expands symmetrically. Lungs are clear to auscultation, no crackles or wheezes. Heart:  Normal rate and regular rhythm. S1 and S2 normal without gallop, murmur, click, rub or other extra sounds.  tachycardic Extremities:  No clubbing, cyanosis, edema, or deformity noted.   Skin:  Intact without suspicious lesions or rashes   Impression & Recommendations:  Problem # 1:  HYPERTENSION, BENIGN SYSTEMIC (ICD-401.1) Assessment Improved at goal.  continue for nwo.  in future may need increase to 25HCTZ.  Check BMP given started on ACEI. Her updated medication list for this problem includes:    Zestoretic 20-12.5 Mg Tabs (Lisinopril-hydrochlorothiazide) .Marland Kitchen... Take one by mouth daily  Orders: Basic Met-[facility] [id number]) [facility]- Est  Level 4 [id number])  Problem # 2:  DIABETES MELLITUS II, UNCOMPLICATED ([id number]) Assessment: Improved  metformin.  check cbg today.  also checked fasting BMP.  proteinuria on ACEI.  added glimepiride today.  The following medications were removed from the medication  list:    Byetta 5 Mcg Pen 5 Mcg/0.81ml Soln (Exenatide) ..... Inject 5 mcg subcutaneously twice a day Her updated medication list for this problem includes:    Bayer Childrens Aspirin 81 Mg Chew (Aspirin) .Marland Kitchen... Take 1 tablet by mouth once a day    Zestoretic 20-12.5 Mg Tabs (Lisinopril-hydrochlorothiazide) .Marland Kitchen... Take one by mouth daily    Glimepiride 2 Mg Tabs (Glimepiride) .Marland Kitchen... Take one daily    Metformin Hcl 500 Mg Tabs (Metformin hcl) ..... One by mouth two times a day with food  Orders: Basic Met-[facility] [id number]) Glucose Cap-[facility] [id number]) [facility]- Est  Level 4 [id number])  Labs  Reviewed: Creat: 1.00 (03/31/2009)   Microalbumin: >2+   reflex to regular dipstick (01/11/2009) Reviewed HgBA1c results: >14.0 (03/31/2009)  >14.0 (12/21/2008)  Problem # 3:  HYPERLIPIDEMIA (ICD-272.4)  taking med. Her updated medication list for this problem includes:    Zocor 40 Mg Tabs (Simvastatin) .Marland Kitchen... 1 tab by mouth daily  Orders: [facility]- Est  Level 4 [id number])  Labs Reviewed: SGOT: 10 (03/31/2009)   SGPT: 11 (03/31/2009)   HDL:58 (03/31/2009)  LDL:233 (08/13/2007)  Trig:228 (03/31/2009)  Complete Medication List: 1)  Bayer Childrens Aspirin 81 Mg Chew (Aspirin) .... Take 1 tablet by mouth once a day 2)  Zestoretic 20-12.5 Mg Tabs (Lisinopril-hydrochlorothiazide) .... Take one by mouth daily 3)  Zocor 40 Mg Tabs (Simvastatin) .Marland Kitchen.. 1 tab by mouth daily 4)  Glimepiride 2 Mg Tabs (Glimepiride) .... Take one daily 5)  Metformin Hcl 500 Mg Tabs (Metformin hcl) .... One by mouth two times a day with food 6)  Exactech Test Strp (Glucose blood) .... Use daily as directed - generic equivalent 7)  Lancets Misc (Lancets) .... Use as directed - generic equivalent  Patient Instructions: 1)  Please return in 1 mo, sooner if needed. 2)  Great job with the blood pressure!!  We will check labwork today to make sure your kidneys like the new medicine. 3)  I have refilled your meds today. 4)  Remember - take Metformin twice daily with food (am and pm), other medicines once daily. 5)  Start Glimepiride (Amaryl) 2mg  in am with breakfast. Prescriptions: METFORMIN HCL 500 MG  TABS (METFORMIN HCL) one by mouth two times a day with food  #62 x 3   Entered and Authorized by:   [name]  MD   Signed by:   [name]  MD on 04/28/2009   Method used:   Electronically to        [facility] (retail)       [phone]       [place], [place]  [address]       Ph: [phone]       Fax: [phone]   RxID:   [id number] MG TABS  (SIMVASTATIN) 1 tab by mouth daily  #30 x 5   Entered and Authorized by:   [name]  MD   Signed by:   [name]  MD on 04/28/2009   Method used:   Electronically to        [facility] (retail)       [phone]       [place], [place]  [address]       Ph: [phone]       Fax: [phone]   RxID:   [id number] ZESTORETIC 20-12.5 MG TABS (LISINOPRIL-HYDROCHLOROTHIAZIDE)  take one by mouth daily  #32 x 5   Entered and Authorized by:   [name]  MD   Signed by:   [name]  MD on 04/28/2009   Method used:   Electronically to        [facility] (retail)       [phone]       [place], [place]  [address]       Ph: [phone]       Fax: [phone]   RxID:   [id number] GLIMEPIRIDE 2 MG TABS (GLIMEPIRIDE) take one daily  #31 x 3   Entered and Authorized by:   [name]  MD   Signed by:   [name]  MD on 04/28/2009   Method used:   Electronically to        [facility] (retail)       [phone]       [place], [place]  [address]       Ph: [phone]       Fax: [phone]   RxID:   [id number]    Prevention & Chronic Care Immunizations   Influenza vaccine: given  (04/26/2009)   Influenza vaccine due: 04/17/2008    Tetanus booster: 01/11/2009: Td   Tetanus booster due: 06/17/2008    Pneumococcal vaccine: Not documented  Other Screening   Pap smear: NEGATIVE FOR INTRAEPITHELIAL LESIONS OR MALIGNANCY.  (12/21/2008)   Pap smear due: 12/27/2009   Smoking status: never  (04/28/2009)  Diabetes Mellitus   HgbA1C: >14.0  (03/31/2009)   Hemoglobin A1C due: 11/03/2007    Eye exam: Not documented    Foot exam: yes  (03/31/2009)   Foot exam action/deferral: Do today   High risk foot: Not documented   Foot care education: Not documented   Foot exam due: 07/01/2009    Urine  microalbumin/creatinine ratio: Not documented    Diabetes flowsheet reviewed?: Yes   Progress toward A1C goal: Improved  Lipids   Total Cholesterol: Not documented   Lipid panel action/deferral: Deferred   LDL: 233  (08/13/2007)   LDL Direct: 220  (03/31/2009)   HDL: 58  (03/31/2009)   Triglycerides: 228  (03/31/2009)   Lipid panel due: 08/16/2009    SGOT (AST): 10  (03/31/2009)   BMP action: Deferred   SGPT (ALT): 11  (03/31/2009)   Alkaline phosphatase: 57  (03/31/2009)   Total bilirubin: 0.3  (03/31/2009)   Liver panel due: 05/16/2009    Lipid flowsheet reviewed?: Yes   Progress toward LDL goal: Improved  Hypertension   Last Blood Pressure: 129 / 86  (04/28/2009)   Serum creatinine: 1.00  (03/31/2009)   BMP action: Deferred   Serum potassium 4.3  (99991111)   Basic metabolic panel due: 123XX123    Hypertension flowsheet reviewed?: Yes   Progress toward BP goal: At goal  Self-Management Support :   Personal Goals (by the next clinic visit) :     Personal A1C goal: 10  (02/13/2009)     Personal blood pressure goal: 140/90  (02/13/2009)     Personal LDL goal: 100  (02/13/2009)    Diabetes self-management support: CBG self-monitoring log, Written self-care plan, Referred for self-management class, Referred for medical nutrition therapy  (03/31/2009)    Hypertension self-management support: BP self-monitoring log, Written self-care plan, Referred for medical nutrition therapy  (03/31/2009)  Lipid self-management support: Written self-care plan, Education handout, Referred for medical nutrition therapy  (03/31/2009)

## 2010-07-17 NOTE — Assessment & Plan Note (Signed)
Summary: cpp wk  Medications Added BAYER CHILDRENS ASPIRIN 81 MG CHEW (ASPIRIN) Take 1 tablet by mouth once a day BYETTA 5 MCG PEN 5 MCG/0.02ML SOLN (EXENATIDE) Inject 5 mcg subcutaneously twice a day ZESTORETIC 20-25 MG TABS (LISINOPRIL-HYDROCHLOROTHIAZIDE) Take 1 tablet by mouth once a day ZOCOR 40 MG TABS (SIMVASTATIN) Take 1 tablet by mouth every night ZOCOR 40 MG TABS (SIMVASTATIN) 1 tab by mouth daily GLIPIZIDE XL 10 MG TB24 (GLIPIZIDE) 2 tabs by mouth daily      Allergies Added: NKDA  Vital Signs:  Patient Profile:   40 Years Old Female Height:     61 inches (154.94 cm) Weight:      179 pounds (81.36 kg) BMI:     33.94 Temp:     97.2 degrees F (36.22 degrees C) Pulse rate:   91 / minute BP sitting:   156 / 93  (left arm)  Pt. in pain?   no  Vitals Entered By: Dalbert Mayotte (January 02, 2007 8:40 AM)              Is Patient Diabetic? Yes    PCP:  Karlton Lemon MD  Chief Complaint:  f/u DM, HTN, and physical form.  History of Present Illness: Patient is a 40 year old female who presents for routine follow-up and to have a physical for work.  Ms. Heikkila has a history of DM type 2.  She admits to not checking her sugars for the last 2 months at all.  She states she is taking metformin 500mg  two times a day and byetta 45mcg two times a day.  I see in previous records however that she is supposed to be on glipizide 20mg  and byetta - she was to stop metformin because it causes her to have diarrhea.  She is not taking glipizide.  No shaking or other hypoglycemic symptoms.  States she takes a baby aspirin daily.  Does not take lisinopril.  No recent vision problems (had 'lasik' surgery for an infection in her eye - does not remember which eye) - has an eye appointment next month.  No numbness/tingling in legs.  No urinary complaints.  Understands some of complications of diabetes ('eyes, kidneys') but does not have complete insight into the condition even though her uncle has  bilateral amputations at the ankle - she feels fine so finds it difficult to take her meds or to monitor sugars.  She has a history of hypertension and was to be on hctz/lisinopril 25/20 but does not currently take this.  No chest pain, shortness of breath, claudication.  Does not check BPs at home.  She has a history of HSV T2 but has only had 2 outbreaks since diagnosis 2-3 years ago.  Not on suppression.  She has obesity.  Exercises by walking around parking lot at lunch and with friends in the evening.  Has joined a gym 2 weeks ago.  Finds it hard to eat regular meals 'sometimes i'm not hungry.'  Takes lipitor 10mg  daily - last note stated she should be on zocor 40 per pharmacy clinic.  Current Allergies (reviewed today): No known allergies  Updated/Current Medications (including changes made in today's visit):  BAYER CHILDRENS ASPIRIN 81 MG CHEW (ASPIRIN) Take 1 tablet by mouth once a day BYETTA 5 MCG PEN 5 MCG/0.02ML SOLN (EXENATIDE) Inject 5 mcg subcutaneously twice a day ZESTORETIC 20-25 MG TABS (LISINOPRIL-HYDROCHLOROTHIAZIDE) Take 1 tablet by mouth once a day ZOCOR 40 MG TABS (SIMVASTATIN) 1 tab by mouth daily  GLIPIZIDE XL 10 MG TB24 (GLIPIZIDE) 2 tabs by mouth daily   Past Medical History:    HTN    DM T2    Obesity    h/o CIN1 in 2004    Diagnosed with HSV type 2  6/04    h/o chlamydia 1994  Past Surgical History:    Lasik eye surgery - 01/21/2005    Lipid Panel 05/23/2006  TC=236, TG=142, HDL=52, LDL=156 - 05/26/2006   Family History:    No cancers, no CAD.  +DM    As of 7/08:    Mom L 33 HTN    Dad L. 62 HTN, PUD, CABG, used to drink a lot of ETOH    Brother L. 38 Obesity  Social History:    single, No tobacco, EtOH, or drugs.  Employed working at [employer].  Received bachelor's at [employer] in accounting.  Going to go back to school for medical technology.  Currently in a 2 month relationship.  No kids.   Risk Factors:  Tobacco use:  never    Physical  Exam  General:     Well-developed,well-nourished,in no acute distress; alert,appropriate and cooperative throughout examination Head:     Normocephalic and atraumatic without obvious abnormalities. Eyes:     No corneal or conjunctival inflammation noted.  EOMI. Perrla.  Ears:     External ear exam shows no significant lesions or deformities.  Otoscopic examination reveals clear canals, tympanic membranes are intact bilaterally without bulging, retraction, inflammation or discharge. Hearing is grossly normal bilaterally. Mouth:     Oral mucosa and oropharynx without lesions or exudates. Neck:     No deformities, masses, or tenderness noted. Lungs:     Normal respiratory effort, chest expands symmetrically. Lungs are clear to auscultation, no crackles or wheezes. Heart:     Normal rate and regular rhythm. S1 and S2 normal without gallop, murmur, click, rub or other extra sounds. Abdomen:     Obese, Bowel sounds positive,abdomen soft and non-tender without masses, organomegaly or hernias noted. Extremities:     No clubbing, cyanosis, edema, or deformity noted.    Diabetes Management Exam:    Foot Exam (with socks and/or shoes not present):       Sensory-Pinprick/Light touch:          Left medial foot (L-4): normal          Left dorsal foot (L-5): normal          Left lateral foot (S-1): normal          Right medial foot (L-4): normal          Right dorsal foot (L-5): normal          Right lateral foot (S-1): normal       Sensory-Monofilament:          Left foot: normal          Right foot: normal       Inspection:          Left foot: normal          Right foot: normal       Nails:          Left foot: normal          Right foot: normal    Impression & Recommendations:  Problem # 1:  DIABETES MELLITUS II, UNCOMPLICATED (XX123456) Assessment: Deteriorated A1c markedly elevated at 14.0 today.  Worked mainly on establishing care and education about diabetes today.  Strongly  suspect nonadherence based on this number.  Needs a lot of reinforcement and to develop insight into disease before I feel adherence will pick up.  Advised going to pharm clinic again - based on intolerance to glucophage and very uncontrolled DM, she may need to go straight to lantus.  Continue glipizide and byetta at this time.  Continue daily aspirin.  Eye exam next month.  Has seen Iver Nestle for discussion of dietary measures.  Her updated medication list for this problem includes:    Bayer Childrens Aspirin 81 Mg Chew (Aspirin) .Marland Kitchen... Take 1 tablet by mouth once a day    Byetta 5 Mcg Pen 5 Mcg/0.70ml Soln (Exenatide) ..... Inject 5 mcg subcutaneously twice a day    Zestoretic 20-25 Mg Tabs (Lisinopril-hydrochlorothiazide) .Marland Kitchen... Take 1 tablet by mouth once a day    Glipizide Xl 10 Mg Tb24 (Glipizide) .Marland Kitchen... 2 tabs by mouth daily  Orders: A1C-[facility] [id number]) [place]- Est  Level 4 (99214) Comp Met-[facility] [id number])   Problem # 2:  HYPERTENSION, BENIGN SYSTEMIC (ICD-401.1) Assessment: Deteriorated Not on any medication.  Restarting HCTZ/lisinopril 25/20.  Will have her f/u in one month and check a BMP at that time.  BP check in 1 week on this and titrate as needed.  Her updated medication list for this problem includes:    Zestoretic 20-25 Mg Tabs (Lisinopril-hydrochlorothiazide) .Marland Kitchen... Take 1 tablet by mouth once a day  Orders: [place]- Est  Level 4 [id number])   Problem # 3:  HYPERLIPIDEMIA (ICD-272.4) Assessment: Comment Only Patient is supposed to be taking Zocor 40 and not lipitor 10.  Will contact patient about this.  FLP not due yet.  Will check CMP, CK, and TSH.  Her updated medication list for this problem includes:    Zocor 40 Mg Tabs (Simvastatin) .Marland Kitchen... 1 tab by mouth daily  Orders: [facility]- Est  Level 4 ([address]214) CK (Creatine Kinase)-[facility] [id number]) TSH-[facility] [id number])  The following medications were removed from the medication list:    Zocor 40 Mg Tabs (Simvastatin) .Marland Kitchen... Take 1  tablet by mouth every night  Her updated medication list for this problem includes:    Zocor 40 Mg Tabs (Simvastatin) .Marland Kitchen... 1 tab by mouth daily   Problem # 4:  Preventive Health Care (ICD-V70.0) Assessment: Comment Only Pap due 11/08.  Not trying to get pregnant - discussed multivitamin with folic acid.  Medications Added to Medication List This Visit: 1)  Bayer Childrens Aspirin 81 Mg Chew (Aspirin) .... Take 1 tablet by mouth once a day 2)  Byetta 5 Mcg Pen 5 Mcg/0.57ml Soln (Exenatide) .... Inject 5 mcg subcutaneously twice a day 3)  Zestoretic 20-25 Mg Tabs (Lisinopril-hydrochlorothiazide) .... Take 1 tablet by mouth once a day 4)  Zocor 40 Mg Tabs (Simvastatin) .... Take 1 tablet by mouth every night 5)  Zocor 40 Mg Tabs (Simvastatin) .Marland Kitchen.. 1 tab by mouth daily 6)  Glipizide Xl 10 Mg Tb24 (Glipizide) .... 2 tabs by mouth daily   Patient Instructions: 1)  Restart taking your blood pressure medication.  Come in for a blood pressure check in 1-2 weeks - call for an appointment the day before you come in to let the staff know. 2)  Take byetta and glipizide for your diabetes. 3)  Continue taking a daily aspirin as well. 4)  Your goal blood pressure is 130/80. 5)  Follow up for your eye exam as your as scheduled. 6)  You do not need another pap smear until November. 7)  You are  doing a great job with your exercise, keep it up! 8)  Make sure you eat 3 meals a day even if they are small ones.  Fruits, vegetables, and whole grains are the best for snacks. 9)  Check your blood sugar every morning and write it down.  Call to make another appointment with the pharmacy clinic - you may need to go on insulin shots if the current regimen is not working. 10)  Your 3 month sugar test is 14.0 - it is very important we get your diabetes under control to protect your kidneys, eyes, nerves, and to prevent needing an amputation down the line. 11)  Follow-up with me in 1 month so we can see how you are  doing with these.    Prescriptions: BYETTA 5 MCG PEN 5 MCG/0.02ML SOLN (EXENATIDE) Inject 5 mcg subcutaneously twice a day  #10 x 1   Entered and Authorized by:   Karlton Lemon MD   Signed by:   Karlton Lemon MD on 01/02/2007   Method used:   Electronically sent to ...       [facility] [address]       [address], [place]  [address]       Ph: [phone] or [phone]       Fax: [phone]   RxID:   [id number] ZESTORETIC 20-25 MG TABS (LISINOPRIL-HYDROCHLOROTHIAZIDE) Take 1 tablet by mouth once a day  #30 x 2   Entered and Authorized by:   Karlton Lemon MD   Signed by:   Karlton Lemon MD on 01/02/2007   Method used:   Electronically sent to ...       [facility] [address]*       [address], [place]  [address]       Ph: [phone] or [phone]       Fax: [phone]   RxID:   [id number] ZOCOR 40 MG TABS (SIMVASTATIN) 1 tab by mouth daily  #30 x 2   Entered and Authorized by:   Karlton Lemon MD   Signed by:   Karlton Lemon MD on 01/02/2007   Method used:   Electronically sent to ...       [facility]       [address], [place]  [address]       Ph: [phone] or [phone]       Fax: [phone]   RxID:   [id number] GLIPIZIDE XL 10 MG TB24 (GLIPIZIDE) 2 tabs by mouth daily  #60 x 2   Entered and Authorized by:   Karlton Lemon MD   Signed by:   Karlton Lemon MD on 01/02/2007   Method used:   Electronically sent to ...       [facility]       [address]       [place], [place]  [address]       Ph: [phone] or [phone]       Fax: [phone]   RxID:   [id number]      Laboratory Results   Blood Tests   Date/Time Recieved: January 02, 2007 8:43 AM Date/Time Reported: January 02, 2007 8:50  AM  HGBA1C: 14.0%   (Normal Range: Non-Diabetic - 3-6%   Control Diabetic - 6-8%)  CBC Comments:  ...................................................................[place],  January 02, 2007 8:50 AM

## 2010-07-17 NOTE — Assessment & Plan Note (Signed)
Summary: FU BP/[facility]   Vital Signs:  Patient profile:   40 year old female Weight:      165.4 pounds BMI:     31.37 BP sitting:   146 / 104  (right arm)  Vitals Entered By: Geanie Cooley RN (February 13, 2009 10:18 AM) CC: f/u bp Is Patient Diabetic? Yes  Pain Assessment Patient in pain? no        Primary Care Provider:  [name]  MD  CC:  f/u bp.  History of Present Illness: CC: f/u DM, HTN  1. DM - started taking metformin in the morning.  doing well as far as GI side fx.  States will get paid this friday and will buy glucometer then.  states last eye exam was last november with Dr. Ricki Miller.  Will send for records again.  A1c >14% 2009-01-24.  states brother died last 02-25-23 from CHF and diabetes complications (was one year older than Dominica).  2. HTN - restarted zestoretic.  Doing well.  no HA, vision changes, chest pain, tightness, urinary changes, LE swelling.  BP today 146/104 improved from last time (179/102).  3. HLD - on zocor 40mg  daily.  states had FLP done last week at work.  To get records and bring in.  Will need CMP next visit.  4. obesity - [name] says she has started exercising more - walking about 1 hour daily 5 days a week.  Discussed eating habits, states yesterday ate [employer] dinner consisting of chicken nuggets and fries.  Seems to eat out of convenience often at fast food restaurants.  States normally has sausage and cheese biscuit for breakfast.  Does not really make meals at home.  Living atboyfriends- he makes her scrambled eggs, bacon and toast for breakfast.  5. preventative - UTD.  mammo to start at 39 given fm hx.  Meds she is currently on: Zestoretic QD Metformin 500mg  QAM ASA QD Zocor.  Habits & Providers  Alcohol-Tobacco-Diet     Tobacco Status: never     Ophthalmologist: Dr. Ricki Miller  Current Medications (verified): 1)  Bayer Childrens Aspirin 81 Mg Chew (Aspirin) .... Take 1 Tablet By Mouth Once A Day 2)  Byetta 5 Mcg Pen 5  Mcg/0.55ml Soln (Exenatide) .... Inject 5 Mcg Subcutaneously Twice A Day 3)  Zestoretic 20-12.5 Mg Tabs (Lisinopril-Hydrochlorothiazide) .... Take One By Mouth Daily 4)  Zocor 40 Mg Tabs (Simvastatin) .Marland Kitchen.. 1 Tab By Mouth Daily 5)  Glipizide Xl 10 Mg Tb24 (Glipizide) .... One Tab By Mouth Daily 6)  Metformin Hcl 500 Mg  Tabs (Metformin Hcl) .... One By Mouth Two Times A Day With Food 7)  Actos 15 Mg  Tabs (Pioglitazone Hcl) .Marland Kitchen.. 1 Tab By Mouth Daily 8)  Exactech Test  Strp (Glucose Blood) .... Use Daily As Directed - Generic Equivalent 9)  Lancets  Misc (Lancets) .... Use As Directed - Generic Equivalent  Allergies (verified): No Known Drug Allergies  Past History:  Past Medical History: Last updated: 01/02/2007 HTN DM T2 Obesity h/o CIN1 in 2004 Diagnosed with HSV type 2  6/04 h/o chlamydia 1994  Past Surgical History: Lasik eye surgery - 01/21/2005  Family History: no CAD.  +DM BRCA in 2 aunts Mom L 47 HTN Dad L. 62 HTN, PUD, CABG, used to drink a lot of ETOH Brother Died February 25, 2008 Obesity, CHF, DM  Social History: single, No tobacco, EtOH, or drugs.  Employed working at [employer].  Received bachelor's at [employer] in accounting.  Going to go  back to school for medical technology.  Currently in a 3 month relationship.  No kids.  Physical Exam  General:  Well-developed,well-nourished,in no acute distress; alert,appropriate and cooperative throughout examination Lungs:  Normal respiratory effort, chest expands symmetrically. Lungs are clear to auscultation, no crackles or wheezes. Heart:  Normal rate and regular rhythm. S1 and S2 normal without gallop, murmur, click, rub or other extra sounds.   Impression & Recommendations:  Problem # 1:  HYPERTENSION, BENIGN SYSTEMIC (ICD-401.1) Assessment Improved  zestoretic.  may increast HCTZ component to 25mg  at next visit.  doing well on this.  Needs CMP next visit.  Her updated medication list for this problem includes:     Zestoretic 20-12.5 Mg Tabs (Lisinopril-hydrochlorothiazide) .Marland Kitchen... Take one by mouth daily  Orders: [facility]- Est  Level 4 ([address]214)  BP today: 146/104 Prior BP: 179/102 (01/11/2009)  Labs Reviewed: K+: 4.9 (08/06/2007) Creat: : 0.84 (08/06/2007)   LDL: 233 (08/13/2007)     Problem # 2:  HYPERLIPIDEMIA (ICD-272.4) Assessment: Unchanged  attain FLP from last week at work.  continue zocor.  Will also need CMP.  Her updated medication list for this problem includes:    Zocor 40 Mg Tabs (Simvastatin) .Marland Kitchen... 1 tab by mouth daily  Orders: [facility]- Est  Level 4 [id number])  Labs Reviewed: SGOT: 15 (08/06/2007)   SGPT: 25 (08/06/2007)   LDL:233 (08/13/2007)  Problem # 3:  DIABETES MELLITUS II, UNCOMPLICATED (XX123456) Assessment: Unchanged  only on Zestoretic, metformin.  advised to increase Metformin to two times a day and to start glipizide daily.  Discussed need for insulin.  Pt wants to continue trial of by mouth meds for another few weeks.  last A1c >14.  fasting cbg today 316.  forgto to give log today.  Her updated medication list for this problem includes:    Bayer Childrens Aspirin 81 Mg Chew (Aspirin) .Marland Kitchen... Take 1 tablet by mouth once a day    Byetta 5 Mcg Pen 5 Mcg/0.60ml Soln (Exenatide) ..... Inject 5 mcg subcutaneously twice a day    Zestoretic 20-12.5 Mg Tabs (Lisinopril-hydrochlorothiazide) .Marland Kitchen... Take one by mouth daily    Glipizide Xl 10 Mg Tb24 (Glipizide) ..... One tab by mouth daily    Metformin Hcl 500 Mg Tabs (Metformin hcl) ..... One by mouth two times a day with food    Actos 15 Mg Tabs (Pioglitazone hcl) .Marland Kitchen... 1 tab by mouth daily  Orders: [facility]- Est  Level 4 (99214) Glucose Cap-[facility] [id number])  Labs Reviewed: Creat: 0.84 (08/06/2007)   Microalbumin: >2+   reflex to regular dipstick (01/11/2009) Reviewed HgBA1c results: >14.0 (12/21/2008)  14.0 (08/06/2007)  Problem # 4:  OBESITY, NOS (ICD-278.00) Assessment: Unchanged weight loss slince last visit, patient has been  walking 1 hour 5 days out of the week.  Discussed healthy eating, to decrease amount of sodium in diet, decrease fast food, try and plan foods, discussed healthy breakfast options, yogurt, fruit, milk and cereal, granola bars instead of sausage biscuits.  RTC 1 mo for f/u. Orders: [facility]- Est  Level 4 [id number])  Complete Medication List: 1)  Bayer Childrens Aspirin 81 Mg Chew (Aspirin) .... Take 1 tablet by mouth once a day 2)  Byetta 5 Mcg Pen 5 Mcg/0.38ml Soln (Exenatide) .... Inject 5 mcg subcutaneously twice a day 3)  Zestoretic 20-12.5 Mg Tabs (Lisinopril-hydrochlorothiazide) .... Take one by mouth daily 4)  Zocor 40 Mg Tabs (Simvastatin) .Marland Kitchen.. 1 tab by mouth daily 5)  Glipizide Xl 10 Mg Tb24 (Glipizide) .... One tab by  mouth daily 6)  Metformin Hcl 500 Mg Tabs (Metformin hcl) .... One by mouth two times a day with food 7)  Actos 15 Mg Tabs (Pioglitazone hcl) .Marland Kitchen.. 1 tab by mouth daily 8)  Exactech Test Strp (Glucose blood) .... Use daily as directed - generic equivalent 9)  Lancets Misc (Lancets) .... Use as directed - generic equivalent  Patient Instructions: 1)  Come back in 3-4 weeks. 2)  Good job with your medicines!  Keep taking.  Increase the Metformin to twice a day.  3)  Keep thinking about small healthy changes to your diet. 4)  Call clinic with questions. 5)  Call Dr. Margarita Grizzle office and ask record to be faxed to [place] (at [phone]). 6)  Get a glucometer friday and start checking sugars in the morning fasting and bring to your next visit (we gave you a log today). Prescriptions: GLIPIZIDE XL 10 MG TB24 (GLIPIZIDE) one tab by mouth daily  #34 x 3   Entered and Authorized by:   [name]  MD   Signed by:   [name]  MD on 02/13/2009   Method used:   Electronically to        [facility] [address] [phone]* (retail)       [address]       [place], [place]  [address]       Ph: [phone] or [phone]       Fax: [phone]   RxID:    [id number] METFORMIN HCL 500 MG  TABS (METFORMIN HCL) one by mouth two times a day with food  #62 x 3   Entered and Authorized by:   [name]  MD   Signed by:   [name]  MD on 02/13/2009   Method used:   [facility]        [facility] [address] [phone]* (retail)       [address]       [place], [place]  [address]       Ph: [phone] or [phone]       Fax: [phone]   RxID:   [id number]   Prevention & Chronic Care Immunizations   Influenza vaccine: given  (04/18/2007)   Influenza vaccine due: 04/17/2008    Tetanus booster: 01/11/2009: Td   Tetanus booster due: 06/17/2008    Pneumococcal vaccine: Not documented  Other Screening   Pap smear: NEGATIVE FOR INTRAEPITHELIAL LESIONS OR MALIGNANCY.  (12/21/2008)   Pap smear due: 12/27/2009  Reports requested:  Smoking status: never  (02/13/2009)  Diabetes Mellitus   HgbA1C: >14.0  (12/21/2008)   Hemoglobin A1C due: 11/03/2007    Eye exam: Not documented   Last eye exam report requested.    Foot exam: yes  (01/02/2007)   Foot exam action/deferral: Deferred   High risk foot: Not documented   Foot care education: Not documented   Foot exam due: 04/04/2007    Urine microalbumin/creatinine ratio: Not documented    Diabetes flowsheet reviewed?: Yes   Progress toward A1C goal: Improved  Lipids   Total Cholesterol: Not documented   Lipid panel action/deferral: Deferred   LDL: 233  (08/13/2007)   LDL Direct: 233  (08/06/2007)   HDL: Not documented   Triglycerides: Not documented   Lipid panel due: 08/16/2009    SGOT (AST): 15  (08/06/2007)   BMP action: Deferred   SGPT (ALT): 25  (08/06/2007)  Alkaline phosphatase: 67  (08/06/2007)   Total bilirubin: 0.3  (08/06/2007)   Liver panel due: 05/16/2009    Lipid flowsheet reviewed?: Yes   Progress toward LDL goal: Unchanged  Hypertension   Last Blood Pressure: 146 / 104  (02/13/2009)   Serum creatinine:  0.84  (08/06/2007)   BMP action: Deferred   Serum potassium 4.9  (A999333)   Basic metabolic panel due: 123XX123    Hypertension flowsheet reviewed?: Yes   Progress toward BP goal: Improved  Self-Management Support :   Personal Goals (by the next clinic visit) :     Personal A1C goal: 10  (02/13/2009)     Personal blood pressure goal: 140/90  (02/13/2009)     Personal LDL goal: 100  (02/13/2009)    Patient will work on the following items until the next clinic visit to reach self-care goals:     Medications and monitoring: take my medicines every day, check my blood sugar, bring all of my medications to every visit  (02/13/2009)     Eating: eat more vegetables, eat foods that are low in salt  (02/13/2009)     Activity: take a 30 minute walk every day  (02/13/2009)    Diabetes self-management support: Written self-care plan  (02/13/2009)   Diabetes care plan printed    Hypertension self-management support: Written self-care plan  (02/13/2009)   Hypertension self-care plan printed.    Lipid self-management support: Written self-care plan  (02/13/2009)   Lipid self-care plan printed.   Nursing Instructions: Request report of last diabetic eye exam

## 2010-07-17 NOTE — Progress Notes (Signed)
Summary: diag.mammogram/ts  Phone Note Call from Patient Call back at Home Phone [phone]   Summary of Call: pt says the breast center is needing an order for a mammogram for her left breast Initial call taken by: Samara Snide,  December 25, 2009 3:45 PM  Follow-up for Phone Call        1.) called the breast ctr. pt needs diagnostic mammogram to f/up on her left breast. sched. appt for 01-02-10 at 8:50 am and faxed the order. 2.) called pt and informed of appt. pt will keep appt at the breast ctr Follow-up by: Mauricia Area CMA,,  December 25, 2009 4:27 PM

## 2010-09-02 LAB — GLUCOSE, CAPILLARY: Glucose-Capillary: 183 mg/dL — ABNORMAL HIGH (ref 70–99)

## 2010-09-19 LAB — GLUCOSE, CAPILLARY: Glucose-Capillary: 273 mg/dL — ABNORMAL HIGH (ref 70–99)

## 2010-09-22 LAB — GLUCOSE, CAPILLARY: Glucose-Capillary: 316 mg/dL — ABNORMAL HIGH (ref 70–99)

## 2011-03-19 LAB — URINALYSIS, ROUTINE W REFLEX MICROSCOPIC
Bilirubin Urine: NEGATIVE
Glucose, UA: >1000 — AB
Ketones, ur: 15 — AB
Nitrite: NEGATIVE
Protein, ur: 100 — AB
Red Sub, UA: 0.75 — AB
Specific Gravity, Urine: 1.035 — ABNORMAL HIGH
Urobilinogen, UA: 0.2
pH: 5.5

## 2011-03-19 LAB — URINE MICROSCOPIC-ADD ON

## 2011-03-19 LAB — CBC
HCT: 44.2
Hemoglobin: 14.3
MCHC: 32.5
MCV: 79.7
Platelets: 278
RBC: 5.54 — ABNORMAL HIGH
RDW: 14.4
WBC: 8.2

## 2011-03-19 LAB — DIFFERENTIAL
Basophils Absolute: 0
Basophils Relative: 1
Eosinophils Absolute: 0.1
Eosinophils Relative: 1
Lymphocytes Relative: 38
Lymphs Abs: 3.1
Monocytes Absolute: 0.5
Monocytes Relative: 6
Neutro Abs: 4.5
Neutrophils Relative %: 55

## 2011-03-27 ENCOUNTER — Emergency Department (HOSPITAL_COMMUNITY): Payer: Worker's Compensation

## 2011-03-27 ENCOUNTER — Emergency Department (HOSPITAL_COMMUNITY)
Admission: EM | Admit: 2011-03-27 | Discharge: 2011-03-28 | Disposition: A | Discharge status: Home / Self Care | Payer: Worker's Compensation | Attending: Emergency Medicine | Admitting: Emergency Medicine

## 2011-03-27 DIAGNOSIS — W010XXA Fall on same level from slipping, tripping and stumbling without subsequent striking against object, initial encounter: Secondary | ICD-10-CM | POA: Insufficient documentation

## 2011-03-27 DIAGNOSIS — I10 Essential (primary) hypertension: Secondary | ICD-10-CM | POA: Insufficient documentation

## 2011-03-27 DIAGNOSIS — M25569 Pain in unspecified knee: Secondary | ICD-10-CM | POA: Insufficient documentation

## 2011-03-27 DIAGNOSIS — Z794 Long term (current) use of insulin: Secondary | ICD-10-CM | POA: Insufficient documentation

## 2011-03-27 DIAGNOSIS — E119 Type 2 diabetes mellitus without complications: Secondary | ICD-10-CM | POA: Insufficient documentation

## 2011-03-27 DIAGNOSIS — Z79899 Other long term (current) drug therapy: Secondary | ICD-10-CM | POA: Insufficient documentation

## 2011-03-29 ENCOUNTER — Emergency Department (HOSPITAL_COMMUNITY): Payer: Medicaid Other

## 2011-03-29 ENCOUNTER — Inpatient Hospital Stay (HOSPITAL_COMMUNITY)
Admission: EM | Admit: 2011-03-29 | Discharge: 2011-04-04 | DRG: 683 | Disposition: A | Discharge status: Home / Self Care | Payer: Medicaid Other | Attending: Internal Medicine | Admitting: Internal Medicine

## 2011-03-29 DIAGNOSIS — I10 Essential (primary) hypertension: Secondary | ICD-10-CM

## 2011-03-29 DIAGNOSIS — N179 Acute kidney failure, unspecified: Principal | ICD-10-CM | POA: Diagnosis present

## 2011-03-29 DIAGNOSIS — D638 Anemia in other chronic diseases classified elsewhere: Secondary | ICD-10-CM | POA: Diagnosis present

## 2011-03-29 DIAGNOSIS — N390 Urinary tract infection, site not specified: Secondary | ICD-10-CM | POA: Diagnosis present

## 2011-03-29 DIAGNOSIS — N189 Chronic kidney disease, unspecified: Secondary | ICD-10-CM | POA: Diagnosis present

## 2011-03-29 DIAGNOSIS — D509 Iron deficiency anemia, unspecified: Secondary | ICD-10-CM | POA: Diagnosis present

## 2011-03-29 DIAGNOSIS — E11319 Type 2 diabetes mellitus with unspecified diabetic retinopathy without macular edema: Secondary | ICD-10-CM | POA: Diagnosis present

## 2011-03-29 DIAGNOSIS — E1139 Type 2 diabetes mellitus with other diabetic ophthalmic complication: Secondary | ICD-10-CM | POA: Diagnosis present

## 2011-03-29 DIAGNOSIS — E669 Obesity, unspecified: Secondary | ICD-10-CM | POA: Diagnosis present

## 2011-03-29 DIAGNOSIS — Z794 Long term (current) use of insulin: Secondary | ICD-10-CM

## 2011-03-29 DIAGNOSIS — I129 Hypertensive chronic kidney disease with stage 1 through stage 4 chronic kidney disease, or unspecified chronic kidney disease: Secondary | ICD-10-CM | POA: Diagnosis present

## 2011-03-29 LAB — DIFFERENTIAL
Basophils Absolute: 0 10*3/uL (ref 0.0–0.1)
Basophils Relative: 0 % (ref 0–1)
Eosinophils Absolute: 0.1 10*3/uL (ref 0.0–0.7)
Eosinophils Relative: 1 % (ref 0–5)
Lymphocytes Relative: 17 % (ref 12–46)
Lymphs Abs: 1.8 10*3/uL (ref 0.7–4.0)
Monocytes Absolute: 0.7 10*3/uL (ref 0.1–1.0)
Monocytes Relative: 6 % (ref 3–12)
Neutro Abs: 7.8 10*3/uL — ABNORMAL HIGH (ref 1.7–7.7)
Neutrophils Relative %: 75 % (ref 43–77)

## 2011-03-29 LAB — CBC
HCT: 32.8 % — ABNORMAL LOW (ref 36.0–46.0)
Hemoglobin: 10.7 g/dL — ABNORMAL LOW (ref 12.0–15.0)
MCH: 25.8 pg — ABNORMAL LOW (ref 26.0–34.0)
MCHC: 32.6 g/dL (ref 30.0–36.0)
MCV: 79.2 fL (ref 78.0–100.0)
Platelets: 335 10*3/uL (ref 150–400)
RBC: 4.14 MIL/uL (ref 3.87–5.11)
RDW: 15 % (ref 11.5–15.5)
WBC: 10.3 10*3/uL (ref 4.0–10.5)

## 2011-03-29 LAB — BASIC METABOLIC PANEL
BUN: 34 mg/dL — ABNORMAL HIGH (ref 6–23)
CO2: 25 mEq/L (ref 19–32)
Calcium: 9.2 mg/dL (ref 8.4–10.5)
Chloride: 104 mEq/L (ref 96–112)
Creatinine, Ser: 4.24 mg/dL — ABNORMAL HIGH (ref 0.50–1.10)
GFR calc Af Amer: 14 mL/min — ABNORMAL LOW (ref >90)
GFR calc non Af Amer: 12 mL/min — ABNORMAL LOW (ref >90)
Glucose, Bld: 46 mg/dL — ABNORMAL LOW (ref 70–99)
Potassium: 3.6 mEq/L (ref 3.5–5.1)
Sodium: 140 mEq/L (ref 135–145)

## 2011-03-29 LAB — POCT I-STAT TROPONIN I: Troponin i, poc: 0.06 ng/mL (ref 0.00–0.08)

## 2011-03-30 LAB — HEMOGLOBIN A1C
Hgb A1c MFr Bld: 5.9 % — ABNORMAL HIGH (ref <5.7)
Mean Plasma Glucose: 123 mg/dL — ABNORMAL HIGH (ref <117)

## 2011-03-30 LAB — TSH: TSH: 5.204 u[IU]/mL — ABNORMAL HIGH (ref 0.350–4.500)

## 2011-03-30 LAB — CARDIAC PANEL(CRET KIN+CKTOT+MB+TROPI)
CK, MB: 5.8 ng/mL — ABNORMAL HIGH (ref 0.3–4.0)
CK, MB: 4.7 ng/mL — ABNORMAL HIGH (ref 0.3–4.0)
CK, MB: 4 ng/mL (ref 0.3–4.0)
Relative Index: 1.7 (ref 0.0–2.5)
Relative Index: 1.6 (ref 0.0–2.5)
Relative Index: 1.4 (ref 0.0–2.5)
Total CK: 340 U/L — ABNORMAL HIGH (ref 7–177)
Total CK: 293 U/L — ABNORMAL HIGH (ref 7–177)
Total CK: 277 U/L — ABNORMAL HIGH (ref 7–177)
Troponin I: <0.3 ng/mL (ref <0.30)
Troponin I: <0.3 ng/mL (ref <0.30)
Troponin I: <0.3 ng/mL (ref <0.30)

## 2011-03-30 LAB — HEPATITIS B SURFACE ANTIBODY,QUALITATIVE: Hep B S Ab: NEGATIVE

## 2011-03-30 LAB — URINALYSIS, ROUTINE W REFLEX MICROSCOPIC
Bilirubin Urine: NEGATIVE
Glucose, UA: 100 mg/dL — AB
Ketones, ur: NEGATIVE mg/dL
Nitrite: NEGATIVE
Protein, ur: >300 mg/dL — AB
Specific Gravity, Urine: 1.015 (ref 1.005–1.030)
Urobilinogen, UA: 0.2 mg/dL (ref 0.0–1.0)
pH: 5.5 (ref 5.0–8.0)

## 2011-03-30 LAB — BASIC METABOLIC PANEL
BUN: 35 mg/dL — ABNORMAL HIGH (ref 6–23)
CO2: 23 mEq/L (ref 19–32)
Calcium: 8.7 mg/dL (ref 8.4–10.5)
Chloride: 107 mEq/L (ref 96–112)
Creatinine, Ser: 4.54 mg/dL — ABNORMAL HIGH (ref 0.50–1.10)
GFR calc Af Amer: 13 mL/min — ABNORMAL LOW (ref >90)
GFR calc non Af Amer: 11 mL/min — ABNORMAL LOW (ref >90)
Glucose, Bld: 85 mg/dL (ref 70–99)
Potassium: 3.1 mEq/L — ABNORMAL LOW (ref 3.5–5.1)
Sodium: 141 mEq/L (ref 135–145)

## 2011-03-30 LAB — URINE MICROSCOPIC-ADD ON

## 2011-03-30 LAB — LIPID PANEL
Cholesterol: 176 mg/dL (ref 0–200)
HDL: 55 mg/dL (ref >39)
LDL Cholesterol: 103 mg/dL — ABNORMAL HIGH (ref 0–99)
Total CHOL/HDL Ratio: 3.2 RATIO
Triglycerides: 88 mg/dL (ref <150)
VLDL: 18 mg/dL (ref 0–40)

## 2011-03-30 LAB — T4, FREE: Free T4: 1.11 ng/dL (ref 0.80–1.80)

## 2011-03-30 LAB — GLUCOSE, CAPILLARY: Glucose-Capillary: 84 mg/dL (ref 70–99)

## 2011-03-30 LAB — PRO B NATRIURETIC PEPTIDE: Pro B Natriuretic peptide (BNP): 32725 pg/mL — ABNORMAL HIGH (ref 0–125)

## 2011-03-30 LAB — PROTEIN / CREATININE RATIO, URINE
Creatinine, Urine: 106.4 mg/dL
Protein Creatinine Ratio: 5.17 — ABNORMAL HIGH (ref 0.00–0.15)
Total Protein, Urine: 550.1 mg/dL

## 2011-03-30 LAB — HEPATITIS C ANTIBODY: HCV Ab: NEGATIVE

## 2011-03-30 NOTE — H&P (Signed)
[name], [name] NO.:  [id number]  MEDICAL RECORD NO.:  [id number]  LOCATION:  WLED                         FACILITY:  [facility]  PHYSICIAN:  Jacquelynn Cree, M.D.   DATE OF BIRTH:  Jun 16, 1971  DATE OF ADMISSION:  03/29/2011 DATE OF DISCHARGE:                             HISTORY & PHYSICAL   PRIMARY CARE PHYSICIAN:  None currently.  She went to the [facility] and saw Dr. Carlota Raspberry yesterday.  CHIEF COMPLAINT:  Dizziness, lightheadedness.  HISTORY OF PRESENT ILLNESS:  Patient is a 40 year old female with a past medical history of hypertension and diabetes who presents to the hospital with a chief complaint of the sudden onset of dizziness and lightheadedness about 5 hours ago.  The patient initially thought that her blood sugar was low, so she drank juice but her symptoms failed to improve and in fact got worse.  It progressed to the point where she thought she might pass out.  She denies any chest pain.  No headaches or visual changes.  She does have some chronic dyspnea on exertion, but no change in the level of dyspnea.  EMS was called and her blood pressure was 250/110.  Per EMS report, her CBG was low at 55 and she was given a cracker on scene.  Her CBG increased to 75, and her initial blood pressure reading upon arrival in the emergency department was 262/104. Patient has been given a dose of Norvasc and labetalol and her blood pressure has come down to 179/84; however, we have been asked to admit her for hypertensive urgency.  PAST MEDICAL HISTORY: 1. Hypertension. 2. Diabetes. 3. Hyperlipidemia. 4. Obesity. 5. History of HSV 2. 6. History of Chlamydia.  PAST SURGICAL HISTORY:  Retinal surgery.  FAMILY HISTORY:  Patient's mother is alive at 53 and has hypertension. The patient's father is alive at 73 and has hypertension, peptic ulcer disease, coronary artery disease status post CABG.  She has a brother who died secondary to complications  of CHF and diabetes.  SOCIAL HISTORY:  Patient is single.  She denies tobacco, alcohol, and drug use.  She has a BA in accounting and currently works at [employer].  ALLERGIES:  No known drug allergies.  MEDICATIONS: 1. Lisinopril 20/25 1 tablet p.o. daily. 2. Humalog 75/25 15 units subcutaneously q.a.m., 20 units     subcutaneously q.p.m.  REVIEW OF SYSTEMS:  CONSTITUTIONAL:  No fever.  Occasional chills. Appetite is good.  No weight changes.  HEENT:  No visual disturbance. CARDIOVASCULAR:  No chest pain or dysrhythmia.  RESPIRATORY:  Chronic dyspnea on exertion.  No cough.  GI:  Occasional nausea.  No vomiting, diarrhea, melena, or hematochezia.  GU:  No dysuria.  ENDOCRINE: Patient's last menstrual period was March 28, 2011, and she is currently on her menses. MUSCULOSKELETAL:  No complaints.  Comprehensive 14-point review of systems is otherwise unremarkable.  PHYSICAL EXAMINATION:  VITAL SIGNS:  Temperature 97.4, pulse 91, respirations 19, blood pressure, current  179/84, O2 saturation 97% on room air. GENERAL:  Well-developed, well-nourished, obese female in no acute distress. HEENT:  Normocephalic, atraumatic. PERRL.  EOMI.  Oropharynx is clear. NECK:  Supple, no thyromegaly, no lymphadenopathy,  no jugular venous distention. CHEST:  Lungs are clear to auscultation bilaterally with good air movement. HEART:  Regular rate, rhythm.  No murmurs, rubs, gallops. ABDOMEN:  Soft, nontender, nondistended with normoactive bowel sounds. EXTREMITIES:  2+ edema bilaterally. Skin:  Dry.  No rashes. NEUROLOGIC:  Patient is alert and oriented x3.  Cranial nerves 2 through 12 are grossly intact.  Nonfocal.  DATA REVIEW:  Chest x-ray shows enlargement of the cardiac silhouette. Minimal bronchitic changes and small left pleural effusion.  CT scan of the head was normal.  12-lead EKG shows sinus tachycardia at 114 beats per minute and nonspecific T-wave abnormalities including T-wave  flattening and inversion in the lateral leads.  LABORATORY DATA:  Sodium is 140, potassium 3.6, chloride 104, bicarb 25, BUN 34, creatinine 4.24, glucose 46, calcium 9.2.  White blood cell count is 10.3, hemoglobin 10.7, hematocrit 32.8, platelets 335. Troponin I is 0.06.  ASSESSMENT AND PLAN: 1. Hypertensive urgency:  Admit the patient to telemetry and cycle     cardiac markers q.6 hours x3 sets.  We will also check a proBNP and     a TSH level given the level of lower extremity edema.  Because of     her EKG abnormalities and significant hypertension with concerns     for possible congestive heart failure, we will obtain a 2-     dimensional echocardiogram. 2. Acute renal failure:  Likely triggered by end-organ dysfunction     from severe hypertension and concurrent treatment with lisinopril     and hydrochlorothiazide.  We will hold the     lisinopril/hydrochlorothiazide and bring her blood pressure down     with calcium channel blocker, beta-blocker, and alpha blockers.  We     will monitor her renal function closely, check a urine analysis to     determine if she has proteinuria, and consider a nephrology     consultation if her renal function does not improve. 3. Normocytic anemia:  Suspect this is likely due to menstrual losses.     Would not work this up further at the present time. 4. Type 2 diabetes/hypoglycemia:  Suspect that the patient's     hypoglycemia is stemming from decreased clearance of insulin from     her acute renal failure.  We will simply cover her with sliding     scale insulin only and discontinue all of her long-acting insulin     therapy. 5. History of hyperlipidemia:  We will check a fasting lipid panel in     the morning. 6. Obesity:  We will obtain a dietitian consultation for help with her     diet. 7. Prophylaxis:  Initiate sequential compression devices for deep vein     thrombosis prophylaxis. Time spent on admission including face-to-face time  equals approximately 1 hour.     Jacquelynn Cree, M.D.     CR/MEDQ  D:  03/29/2011  T:  03/30/2011  Job:  [id number]  Electronically Signed by Jacquelynn Cree M.D. on 03/30/2011 12:29:55 PM

## 2011-03-31 ENCOUNTER — Inpatient Hospital Stay (HOSPITAL_COMMUNITY): Payer: Medicaid Other

## 2011-03-31 LAB — URINALYSIS, ROUTINE W REFLEX MICROSCOPIC
Bilirubin Urine: NEGATIVE
Glucose, UA: 100 mg/dL — AB
Ketones, ur: NEGATIVE mg/dL
Nitrite: NEGATIVE
Protein, ur: >300 mg/dL — AB
Specific Gravity, Urine: 1.014 (ref 1.005–1.030)
Urobilinogen, UA: 0.2 mg/dL (ref 0.0–1.0)
pH: 5.5 (ref 5.0–8.0)

## 2011-03-31 LAB — CBC
HCT: 25.3 % — ABNORMAL LOW (ref 36.0–46.0)
Hemoglobin: 8.2 g/dL — ABNORMAL LOW (ref 12.0–15.0)
MCH: 25.9 pg — ABNORMAL LOW (ref 26.0–34.0)
MCHC: 32.4 g/dL (ref 30.0–36.0)
MCV: 79.8 fL (ref 78.0–100.0)
Platelets: 259 10*3/uL (ref 150–400)
RBC: 3.17 MIL/uL — ABNORMAL LOW (ref 3.87–5.11)
RDW: 15.1 % (ref 11.5–15.5)
WBC: 6.8 10*3/uL (ref 4.0–10.5)

## 2011-03-31 LAB — COMPREHENSIVE METABOLIC PANEL
ALT: 8 U/L (ref 0–35)
AST: 12 U/L (ref 0–37)
Albumin: 2.1 g/dL — ABNORMAL LOW (ref 3.5–5.2)
Alkaline Phosphatase: 48 U/L (ref 39–117)
BUN: 39 mg/dL — ABNORMAL HIGH (ref 6–23)
CO2: 25 mEq/L (ref 19–32)
Calcium: 8.6 mg/dL (ref 8.4–10.5)
Chloride: 107 mEq/L (ref 96–112)
Creatinine, Ser: 5.03 mg/dL — ABNORMAL HIGH (ref 0.50–1.10)
GFR calc Af Amer: 11 mL/min — ABNORMAL LOW (ref >90)
GFR calc non Af Amer: 10 mL/min — ABNORMAL LOW (ref >90)
Glucose, Bld: 124 mg/dL — ABNORMAL HIGH (ref 70–99)
Potassium: 3.6 mEq/L (ref 3.5–5.1)
Sodium: 141 mEq/L (ref 135–145)
Total Bilirubin: 0.1 mg/dL — ABNORMAL LOW (ref 0.3–1.2)
Total Protein: 5.3 g/dL — ABNORMAL LOW (ref 6.0–8.3)

## 2011-03-31 LAB — URINE MICROSCOPIC-ADD ON

## 2011-04-01 LAB — URINALYSIS, ROUTINE W REFLEX MICROSCOPIC
Bilirubin Urine: NEGATIVE
Bilirubin Urine: NEGATIVE
Glucose, UA: 100 mg/dL — AB
Glucose, UA: 100 mg/dL — AB
Ketones, ur: NEGATIVE mg/dL
Ketones, ur: NEGATIVE mg/dL
Nitrite: NEGATIVE
Nitrite: NEGATIVE
Protein, ur: >300 mg/dL — AB
Protein, ur: >300 mg/dL — AB
Specific Gravity, Urine: 1.016 (ref 1.005–1.030)
Specific Gravity, Urine: 1.012 (ref 1.005–1.030)
Urobilinogen, UA: 0.2 mg/dL (ref 0.0–1.0)
Urobilinogen, UA: 0.2 mg/dL (ref 0.0–1.0)
pH: 5.5 (ref 5.0–8.0)
pH: 5.5 (ref 5.0–8.0)

## 2011-04-01 LAB — RENAL FUNCTION PANEL
Albumin: 2.2 g/dL — ABNORMAL LOW (ref 3.5–5.2)
BUN: 43 mg/dL — ABNORMAL HIGH (ref 6–23)
CO2: 26 mEq/L (ref 19–32)
Calcium: 8.9 mg/dL (ref 8.4–10.5)
Chloride: 106 mEq/L (ref 96–112)
Creatinine, Ser: 5.25 mg/dL — ABNORMAL HIGH (ref 0.50–1.10)
GFR calc Af Amer: 11 mL/min — ABNORMAL LOW (ref >90)
GFR calc non Af Amer: 9 mL/min — ABNORMAL LOW (ref >90)
Glucose, Bld: 143 mg/dL — ABNORMAL HIGH (ref 70–99)
Phosphorus: 5.8 mg/dL — ABNORMAL HIGH (ref 2.3–4.6)
Potassium: 3.8 mEq/L (ref 3.5–5.1)
Sodium: 140 mEq/L (ref 135–145)

## 2011-04-01 LAB — URINE MICROSCOPIC-ADD ON

## 2011-04-01 LAB — C3 COMPLEMENT: C3 Complement: 115 mg/dL (ref 90–180)

## 2011-04-01 LAB — C4 COMPLEMENT: Complement C4, Body Fluid: 31 mg/dL (ref 10–40)

## 2011-04-01 LAB — ANA: Anti Nuclear Antibody(ANA): NEGATIVE

## 2011-04-01 LAB — IRON AND TIBC
Iron: 18 ug/dL — ABNORMAL LOW (ref 42–135)
Saturation Ratios: 8 % — ABNORMAL LOW (ref 20–55)
TIBC: 224 ug/dL — ABNORMAL LOW (ref 250–470)
UIBC: 206 ug/dL (ref 125–400)

## 2011-04-01 LAB — GLUCOSE, CAPILLARY
Glucose-Capillary: 155 mg/dL — ABNORMAL HIGH (ref 70–99)
Glucose-Capillary: 116 mg/dL — ABNORMAL HIGH (ref 70–99)

## 2011-04-01 LAB — VITAMIN B12: Vitamin B-12: 483 pg/mL (ref 211–911)

## 2011-04-01 LAB — PROTEIN, URINE, 24 HOUR
Collection Interval-UPROT: 24 hours
Protein, 24H Urine: 3001 mg/d — ABNORMAL HIGH (ref 50–100)
Urine Total Volume-UPROT: 1225 mL

## 2011-04-01 LAB — FERRITIN: Ferritin: 34 ng/mL (ref 10–291)

## 2011-04-01 LAB — ANTI-DNA ANTIBODY, DOUBLE-STRANDED: ds DNA Ab: <1 IU/mL (ref <30)

## 2011-04-01 LAB — FOLATE: Folate: 4.7 ng/mL

## 2011-04-01 LAB — ANTISTREPTOLYSIN O TITER: ASO: <25 IU/mL (ref 0–408)

## 2011-04-01 LAB — MAGNESIUM: Magnesium: 2 mg/dL (ref 1.5–2.5)

## 2011-04-02 LAB — UIFE/LIGHT CHAINS/TP QN, 24-HR UR
Albumin, U: DETECTED
Alpha 1, Urine: DETECTED — AB
Alpha 2, Urine: DETECTED — AB
Beta, Urine: DETECTED — AB
Free Kappa Lt Chains,Ur: 13.5 mg/dL — ABNORMAL HIGH (ref 0.14–2.42)
Free Kappa/Lambda Ratio: 5.77 ratio (ref 2.04–10.37)
Free Lambda Excretion/Day: 28.67 mg/d
Free Lambda Lt Chains,Ur: 2.34 mg/dL — ABNORMAL HIGH (ref 0.02–0.67)
Free Lt Chn Excr Rate: 165.38 mg/d
Gamma Globulin, Urine: DETECTED — AB
Time: 24 hours
Total Protein, Urine-Ur/day: 2475 mg/d — ABNORMAL HIGH (ref 10–140)
Total Protein, Urine: 202 mg/dL
Volume, Urine: 1225 mL

## 2011-04-02 LAB — RENAL FUNCTION PANEL
Albumin: 2.3 g/dL — ABNORMAL LOW (ref 3.5–5.2)
BUN: 51 mg/dL — ABNORMAL HIGH (ref 6–23)
CO2: 26 mEq/L (ref 19–32)
Calcium: 9.2 mg/dL (ref 8.4–10.5)
Chloride: 104 mEq/L (ref 96–112)
Creatinine, Ser: 5.95 mg/dL — ABNORMAL HIGH (ref 0.50–1.10)
GFR calc Af Amer: 9 mL/min — ABNORMAL LOW (ref >90)
GFR calc non Af Amer: 8 mL/min — ABNORMAL LOW (ref >90)
Glucose, Bld: 181 mg/dL — ABNORMAL HIGH (ref 70–99)
Phosphorus: 6.3 mg/dL — ABNORMAL HIGH (ref 2.3–4.6)
Potassium: 4.3 mEq/L (ref 3.5–5.1)
Sodium: 140 mEq/L (ref 135–145)

## 2011-04-02 LAB — GLUCOSE, CAPILLARY
Glucose-Capillary: 65 mg/dL — ABNORMAL LOW (ref 70–99)
Glucose-Capillary: 90 mg/dL (ref 70–99)
Glucose-Capillary: 118 mg/dL — ABNORMAL HIGH (ref 70–99)
Glucose-Capillary: 164 mg/dL — ABNORMAL HIGH (ref 70–99)
Glucose-Capillary: 162 mg/dL — ABNORMAL HIGH (ref 70–99)
Glucose-Capillary: 112 mg/dL — ABNORMAL HIGH (ref 70–99)
Glucose-Capillary: 140 mg/dL — ABNORMAL HIGH (ref 70–99)
Glucose-Capillary: 141 mg/dL — ABNORMAL HIGH (ref 70–99)
Glucose-Capillary: 151 mg/dL — ABNORMAL HIGH (ref 70–99)
Glucose-Capillary: 151 mg/dL — ABNORMAL HIGH (ref 70–99)
Glucose-Capillary: 162 mg/dL — ABNORMAL HIGH (ref 70–99)
Glucose-Capillary: 143 mg/dL — ABNORMAL HIGH (ref 70–99)
Glucose-Capillary: 162 mg/dL — ABNORMAL HIGH (ref 70–99)
Glucose-Capillary: 94 mg/dL (ref 70–99)
Glucose-Capillary: 148 mg/dL — ABNORMAL HIGH (ref 70–99)

## 2011-04-02 LAB — IRON AND TIBC
Iron: 26 ug/dL — ABNORMAL LOW (ref 42–135)
Saturation Ratios: 11 % — ABNORMAL LOW (ref 20–55)
TIBC: 247 ug/dL — ABNORMAL LOW (ref 250–470)
UIBC: 221 ug/dL (ref 125–400)

## 2011-04-02 LAB — PROTEIN ELECTROPH W RFLX QUANT IMMUNOGLOBULINS
Albumin ELP: 57.9 % (ref 55.8–66.1)
Alpha-1-Globulin: 5.2 % — ABNORMAL HIGH (ref 2.9–4.9)
Alpha-2-Globulin: 14.1 % — ABNORMAL HIGH (ref 7.1–11.8)
Beta 2: 5.6 % (ref 3.2–6.5)
Beta Globulin: 8 % — ABNORMAL HIGH (ref 4.7–7.2)
Gamma Globulin: 9.2 % — ABNORMAL LOW (ref 11.1–18.8)
M-Spike, %: NOT DETECTED g/dL
Total Protein ELP: 7.3 g/dL (ref 6.0–8.3)

## 2011-04-03 LAB — RENAL FUNCTION PANEL
Albumin: 2.1 g/dL — ABNORMAL LOW (ref 3.5–5.2)
BUN: 53 mg/dL — ABNORMAL HIGH (ref 6–23)
CO2: 24 mEq/L (ref 19–32)
Calcium: 8.7 mg/dL (ref 8.4–10.5)
Chloride: 104 mEq/L (ref 96–112)
Creatinine, Ser: 5.87 mg/dL — ABNORMAL HIGH (ref 0.50–1.10)
GFR calc Af Amer: 9 mL/min — ABNORMAL LOW (ref >90)
GFR calc non Af Amer: 8 mL/min — ABNORMAL LOW (ref >90)
Glucose, Bld: 125 mg/dL — ABNORMAL HIGH (ref 70–99)
Phosphorus: 6 mg/dL — ABNORMAL HIGH (ref 2.3–4.6)
Potassium: 4.4 mEq/L (ref 3.5–5.1)
Sodium: 138 mEq/L (ref 135–145)

## 2011-04-03 LAB — GLUCOSE, CAPILLARY
Glucose-Capillary: 121 mg/dL — ABNORMAL HIGH (ref 70–99)
Glucose-Capillary: 151 mg/dL — ABNORMAL HIGH (ref 70–99)
Glucose-Capillary: 130 mg/dL — ABNORMAL HIGH (ref 70–99)
Glucose-Capillary: 173 mg/dL — ABNORMAL HIGH (ref 70–99)

## 2011-04-03 LAB — BASIC METABOLIC PANEL
BUN: 54 mg/dL — ABNORMAL HIGH (ref 6–23)
CO2: 23 mEq/L (ref 19–32)
Calcium: 8.7 mg/dL (ref 8.4–10.5)
Chloride: 104 mEq/L (ref 96–112)
Creatinine, Ser: 5.78 mg/dL — ABNORMAL HIGH (ref 0.50–1.10)
GFR calc Af Amer: 10 mL/min — ABNORMAL LOW (ref >90)
GFR calc non Af Amer: 8 mL/min — ABNORMAL LOW (ref >90)
Glucose, Bld: 124 mg/dL — ABNORMAL HIGH (ref 70–99)
Potassium: 4.4 mEq/L (ref 3.5–5.1)
Sodium: 138 mEq/L (ref 135–145)

## 2011-04-03 LAB — CBC
HCT: 27.1 % — ABNORMAL LOW (ref 36.0–46.0)
Hemoglobin: 8.9 g/dL — ABNORMAL LOW (ref 12.0–15.0)
MCH: 25.9 pg — ABNORMAL LOW (ref 26.0–34.0)
MCHC: 32.8 g/dL (ref 30.0–36.0)
MCV: 79 fL (ref 78.0–100.0)
Platelets: 286 10*3/uL (ref 150–400)
RBC: 3.43 MIL/uL — ABNORMAL LOW (ref 3.87–5.11)
RDW: 15.1 % (ref 11.5–15.5)
WBC: 7.1 10*3/uL (ref 4.0–10.5)

## 2011-04-03 LAB — URINE CULTURE
Colony Count: 80000
Culture  Setup Time: 201210160515
Special Requests: NEGATIVE

## 2011-04-03 LAB — PTH, INTACT AND CALCIUM
Calcium, Total (PTH): 8.5 mg/dL (ref 8.4–10.5)
PTH: 285.3 pg/mL — ABNORMAL HIGH (ref 14.0–72.0)

## 2011-04-04 LAB — HIV-1 RNA, QUALITATIVE, TMA: HIV-1 RNA, Qualitative, TMA: NOT DETECTED

## 2011-04-04 LAB — CBC
HCT: 27.2 % — ABNORMAL LOW (ref 36.0–46.0)
Hemoglobin: 9 g/dL — ABNORMAL LOW (ref 12.0–15.0)
MCH: 25.9 pg — ABNORMAL LOW (ref 26.0–34.0)
MCHC: 33.1 g/dL (ref 30.0–36.0)
MCV: 78.4 fL (ref 78.0–100.0)
Platelets: 297 10*3/uL (ref 150–400)
RBC: 3.47 MIL/uL — ABNORMAL LOW (ref 3.87–5.11)
RDW: 15.1 % (ref 11.5–15.5)
WBC: 6.3 10*3/uL (ref 4.0–10.5)

## 2011-04-04 LAB — COMPREHENSIVE METABOLIC PANEL
ALT: 7 U/L (ref 0–35)
AST: 11 U/L (ref 0–37)
Albumin: 2.2 g/dL — ABNORMAL LOW (ref 3.5–5.2)
Alkaline Phosphatase: 57 U/L (ref 39–117)
BUN: 56 mg/dL — ABNORMAL HIGH (ref 6–23)
CO2: 25 mEq/L (ref 19–32)
Calcium: 9.3 mg/dL (ref 8.4–10.5)
Chloride: 103 mEq/L (ref 96–112)
Creatinine, Ser: 5.48 mg/dL — ABNORMAL HIGH (ref 0.50–1.10)
GFR calc Af Amer: 10 mL/min — ABNORMAL LOW (ref >90)
GFR calc non Af Amer: 9 mL/min — ABNORMAL LOW (ref >90)
Glucose, Bld: 145 mg/dL — ABNORMAL HIGH (ref 70–99)
Potassium: 4.1 mEq/L (ref 3.5–5.1)
Sodium: 137 mEq/L (ref 135–145)
Total Bilirubin: 0.1 mg/dL — ABNORMAL LOW (ref 0.3–1.2)
Total Protein: 5.5 g/dL — ABNORMAL LOW (ref 6.0–8.3)

## 2011-04-04 LAB — OCCULT BLOOD X 1 CARD TO LAB, STOOL: Fecal Occult Bld: NEGATIVE

## 2011-04-04 LAB — GLUCOSE, CAPILLARY
Glucose-Capillary: 145 mg/dL — ABNORMAL HIGH (ref 70–99)
Glucose-Capillary: 183 mg/dL — ABNORMAL HIGH (ref 70–99)

## 2011-04-04 LAB — PHOSPHORUS: Phosphorus: 5.7 mg/dL — ABNORMAL HIGH (ref 2.3–4.6)

## 2011-04-05 NOTE — Discharge Summary (Signed)
  [name], [name]               ACCOUNT NO.:  [id number]  MEDICAL RECORD NO.:  [id number]  LOCATION:  [id number]                         FACILITY:  [facility]  PHYSICIAN:  Doree Albee, M.D.DATE OF BIRTH:  09/28/1970  DATE OF ADMISSION:  03/29/2011 DATE OF DISCHARGE:  04/04/2011                              DISCHARGE SUMMARY   FINAL DISCHARGE DIAGNOSES: 1. Acute renal failure on chronic kidney disease, possible need for     hemodialysis. 2. Hypertension. 3. Anemia of chronic kidney disease. 4. Type 2 diabetes mellitus, hemoglobin A1c 5.9%.  CONDITION ON DISCHARGE:  Stable.  MEDICATIONS ON DISCHARGE: 1. Amlodipine 5 mg b.i.d. 2. Calcitriol 0.25 mcg daily. 3. Clonidine 0.1 mg b.i.d. 4. Ferrous sulfate 325 mg t.i.d. 5. MiraLax 17 g daily. 6. Senokot 2 tablets at bedtime. 7. Humalog Mix insulin 75/25, 10 units twice a day.  HISTORY:  This very pleasant 40 year old lady was admitted with symptoms of dizziness and lightheadedness.  Please see initial history and physical examination done by Dr. Jacquelynn Cree.  HOSPITAL PROGRESS:  On initial evaluation, it was felt that the patient had hypertensive urgency with acute renal failure.  Her initial creatinine was 4.24 and her admission blood pressure was 179/84.  She was therefore started on antihypertensive medication and given some intravenous fluids as moderately and then these were discontinued.  She was maximized with her antihypertensive medications.  Dr. Posey Pronto, Nephrology saw this patient and did some investigations.  There is no clear cause found so far for the renal failure apart from likely hypertension and diabetes.  Renal ultrasound was done, which did not show any hydronephrosis and was otherwise unremarkable.  HIV testing was negative.  Over the next few days, her blood pressure is somewhat stabilized and improved and she feels better.  Today, she looks well and feels well and has no specific complaints.  PHYSICAL  EXAMINATION:  VITAL SIGNS:  Temperature 98.6, blood pressure 162/89, pulse 88, saturations 92% on room air. GENERAL:  She looks systemically well. HEART:  Sounds are present and normal. LUNGS:  Lung fields are clear.  She is not in congestive heart failure. She is alert and oriented without any focal neurologic signs.  INVESTIGATIONS:  Today, show sodium of 137, potassium 4.1, bicarbonate 25, BUN 56, creatinine 5.48, hemoglobin 9.0, white blood cell count 6.3, platelets 297.  Protein electrophoresis was not remarkable for evidence of multiple myeloma.  Double-stranded DNA antibody was essentially negative.  C3 and C4 complement levels were in the normal range.  ASO titer was less than 25, unremarkable.  24-hour urine protein collection showed 3000 mg of protein per day.  DISPOSITION:  The patient is now stable to be discharged home and she will follow up with Dr. Elmarie Shiley and then with Nephrology on April 30, 2011, at 3:30 p.m.  In the meantime, I will also encourage the patient to obtain a primary care physician.     Doree Albee, M.D.     NCG/MEDQ  D:  04/04/2011  T:  04/04/2011  Job:  [id number]  Electronically Signed by Hurshel Party M.D. on 04/05/2011 03:55:14 PM

## 2011-04-14 NOTE — Consult Note (Signed)
[name], [name]:  [id number]  MEDICAL RECORD NO.:  [id number]  LOCATION:  [id number]                         FACILITY:  [facility]  PHYSICIAN:  Elmarie Shiley, MD          DATE OF BIRTH:  05/07/1971  DATE OF CONSULTATION:  03/31/2011 DATE OF DISCHARGE:                                CONSULTATION   Nephrology was consulted by Dr. Laurie Panda of the [facility] for the evaluation and management of [name]'s acute renal failure versus acute renal failure on chronic kidney disease with proteinuria.  HISTORY OF PRESENTING ILLNESS:  [name] is a 40 year old African American woman with past medical history significant for hypertension for the last 2 or 3 years that she knows off, diabetes that has been insulin dependent for the last 8 to 12 years (complicated by retinopathy) and unclear baseline renal function.  She does report that about a year ago when she saw Dr. Chalmers Cater, endocrinologist last, she was informed that she had some proteinuria.  Apparently, she was also told of this earlier this year in May, when she went to a local [facility] [place] for routine followup/upper respiratory tract infection.  On Thursday of this past week, she restarted her lisinopril and HCTZ under the supervision of the [facility] [place] where she had gone for worsening pedal edema and headaches.  She restarted these medications after a hiatus of about 5 months as she had lost her insurance late last year after losing her job.  She denies any prior history of acute renal insufficiency, denies history of kidney stones, denies history of hematuria, and denies history of frequent urinary tract infections.  She does report occasional foamy urine and has been having pedal edema for the last few months.  Denies any family history of lupus and reports a maternal uncle that is on dialysis in [place].  Denies any regular NSAID use, denies recent antibiotics (last used  in May 2012, azithromycin) and no recent history of intravenous contrast exposure.  PAST MEDICAL HISTORY: 1. Insulin-dependent diabetes with complicating retinopathy. 2. Hypertension, loosely compliance secondary to financial barriers. 3. Abnormal menstrual patterns.  MEDICATIONS:  Prior to her hospitalization had been taking: 1. Lisinopril/HCTZ 20/12.5 mg for the last 2 days. 2. She was also taking Humalog 75/25, 15 units q.a.m. and 20 units     q.p.m. Currently while in the hospital, she was taking: 1. Amlodipine 10 mg q.h.s. 2. Clonidine 0.1 mg p.o. b.i.d. 3. NovoLog sliding scale. 4. MiraLax17 g p.o. daily p.r.n. 5. Tylenol p.r.n.  ALLERGIES:  No known drug allergies.  SOCIAL HISTORY:  She is single and resides by herself at home.  She does not have any children.  Denies any tobacco, alcohol, or illicit drug abuse.  Currently, has been re-employed for the last 2-1/2 months and is due to get her insurance back in November.  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  Elder brother died 2 years ago, from complications of diabetes and congestive heart failure. Multiple first-degree family members with diabetes and high blood pressure.  REVIEW OF SYSTEMS:  Extensively done, negative other than the pertinent positives in the HPI above.  Explicitly denies any dysuria, urgency, frequency, or hematuria.  Denies any cough, sputum production, wheeze, fevers, or chills.  She has been having pedal edema without any associated claudication or chest pain.  PHYSICAL EXAMINATION:  VITAL SIGNS:  Blood pressure 122/75, down from 181/99 around admission.  Pulse of 89, temperature 98.2 degrees Fahrenheit, respiratory rate of 16, oxygen saturation 96% on room air. GENERAL EVALUATION:  Obese, young African American woman, resting comfortably in bed. HEAD, NECK, AND ENT SYSTEMS:   Head is normocephalic and atraumatic. Pupils are bilaterally equal and reactive to light.  Extraocular muscle movements are normal.  Funduscopy was not done. NECK:  Supple, bulky without any obvious JVD or goiter.  No lymphadenopathy.  No bruit. CARDIOVASCULAR ASSESSMENT:  Pulse is regular rate and rhythm.  Heart sounds S1 and S2 are normal without any obvious murmurs, rubs, or gallops. RESPIRATORY EVALUATION:  Diminished breath sounds bibasilarly.  No obvious rales, retractions, or rhonchi. ABDOMEN:  Soft, obese, nontender without any organomegaly and bowel sounds are normal. EXTREMITIES:  2+ edema is palpable by pedal, extending all the way up to the knees.  LABS:  Sodium 141, potassium 3.6, bicarbonate 25, BUN 39, creatinine 5.0 up from 4.5.  Glucose 124, calcium 8.6, albumin 2.1, hemoglobin 8.2, hematocrit 25.3.  White cell count 6800, platelet count 259,000. Hepatitis B surface antibody, negative.  Hepatitis C surface antibody, negative.  Urine protein to creatinine ratio 5.1 g/g creatinine. Urinalysis reveals specific gravity of 1.015, pH of 5.5.  Urine glucose 100, large amount of blood greater than 300 protein, trace leukocytes. Urine microscopy shows 11 to 20 white cells per high-power field and red cells that are too numerous to count.  Trichomonas was noted in the urine.  Hemoglobin A1c 5.9%.  BNP 32,725.  RADIOLOGICAL IMAGING:  Chest x-ray was done yesterday that reveals enlargement of the cardiac silhouette with minimal bronchitic changes and a left small pleural effusion.  Renal ultrasound is pending.  ASSESSMENT AND PLAN: 1. Acute renal failure versus acute renal failure on chronic kidney     disease.  I suspect that [name] has baseline chronic kidney     disease given her risk factors, and poorly treated hypertension for     the last year or so, given her lack of insurance/medications.  This     is likely compounded in the recent period by her hemodynamic  injury     from significantly elevated blood pressures and now from improving     blood pressure control/renal hypoperfusion.  We will await the     renal ultrasound tomorrow to evaluate renal size and structure and     I doubt clinically that she has any obstructive pathology.  We will     rule out lupus by awaiting an antinuclear antibody and C3/C4 levels     as well as check HIV ELISA.  No acute electrolyte issues are noted     to prompt intervention and it is likely that she has nephrotic     range proteinuria from diabetes, however, this will be need to be     repeated when her blood pressure is improved and her renal function     is back to her "stable state".  We will arrange followup with me in     the clinic upon discharge.  No acute interventions and I agree with     diuretic and  antihypertensive therapy at this time. 2. Anemia.  She reports abnormal menstrual losses, but this could also     probably from chronic kidney disease.  We will check an anemia     panel at this time, and would recommend a fecal occult blood     testing. 3. Hypertension.  Improved and control noted on amlodipine and     clonidine.  Agree with giving p.r.n. bolus doses of furosemide,     which I think she will require chronically. 4. Diabetes.  Fair control with a hemoglobin A1c of 5.9%.  [facility] helping supervise management.     Elmarie Shiley, MD     JP/MEDQ  D:  03/31/2011  T:  04/01/2011  Job:  [id number]  Electronically Signed by Elmarie Shiley MD on 04/14/2011 10:19:54 AM

## 2011-08-23 ENCOUNTER — Encounter: Payer: Self-pay | Admitting: Surgery

## 2011-08-26 ENCOUNTER — Ambulatory Visit: Payer: Self-pay | Admitting: Surgery

## 2011-09-11 ENCOUNTER — Encounter: Payer: Self-pay | Admitting: Family Medicine

## 2011-09-27 ENCOUNTER — Emergency Department (HOSPITAL_COMMUNITY): Payer: Medicaid Other

## 2011-09-27 ENCOUNTER — Encounter (HOSPITAL_COMMUNITY): Payer: Self-pay | Admitting: Emergency Medicine

## 2011-09-27 ENCOUNTER — Inpatient Hospital Stay (HOSPITAL_COMMUNITY)
Admission: EM | Admit: 2011-09-27 | Discharge: 2011-10-08 | DRG: 981 | Payer: Medicaid Other | Source: Ambulatory Visit | Attending: Internal Medicine | Admitting: Internal Medicine

## 2011-09-27 DIAGNOSIS — N289 Disorder of kidney and ureter, unspecified: Secondary | ICD-10-CM

## 2011-09-27 DIAGNOSIS — N184 Chronic kidney disease, stage 4 (severe): Secondary | ICD-10-CM

## 2011-09-27 DIAGNOSIS — I129 Hypertensive chronic kidney disease with stage 1 through stage 4 chronic kidney disease, or unspecified chronic kidney disease: Secondary | ICD-10-CM | POA: Diagnosis present

## 2011-09-27 DIAGNOSIS — E119 Type 2 diabetes mellitus without complications: Secondary | ICD-10-CM

## 2011-09-27 DIAGNOSIS — Y849 Medical procedure, unspecified as the cause of abnormal reaction of the patient, or of later complication, without mention of misadventure at the time of the procedure: Secondary | ICD-10-CM | POA: Diagnosis not present

## 2011-09-27 DIAGNOSIS — R1110 Vomiting, unspecified: Secondary | ICD-10-CM

## 2011-09-27 DIAGNOSIS — E785 Hyperlipidemia, unspecified: Secondary | ICD-10-CM

## 2011-09-27 DIAGNOSIS — E1151 Type 2 diabetes mellitus with diabetic peripheral angiopathy without gangrene: Secondary | ICD-10-CM | POA: Diagnosis present

## 2011-09-27 DIAGNOSIS — Z7982 Long term (current) use of aspirin: Secondary | ICD-10-CM

## 2011-09-27 DIAGNOSIS — R197 Diarrhea, unspecified: Secondary | ICD-10-CM | POA: Diagnosis present

## 2011-09-27 DIAGNOSIS — T82898A Other specified complication of vascular prosthetic devices, implants and grafts, initial encounter: Secondary | ICD-10-CM | POA: Diagnosis not present

## 2011-09-27 DIAGNOSIS — IMO0002 Reserved for concepts with insufficient information to code with codable children: Secondary | ICD-10-CM | POA: Diagnosis present

## 2011-09-27 DIAGNOSIS — J189 Pneumonia, unspecified organism: Principal | ICD-10-CM

## 2011-09-27 DIAGNOSIS — Z794 Long term (current) use of insulin: Secondary | ICD-10-CM

## 2011-09-27 DIAGNOSIS — N6029 Fibroadenosis of unspecified breast: Secondary | ICD-10-CM

## 2011-09-27 DIAGNOSIS — R509 Fever, unspecified: Secondary | ICD-10-CM

## 2011-09-27 DIAGNOSIS — N926 Irregular menstruation, unspecified: Secondary | ICD-10-CM

## 2011-09-27 DIAGNOSIS — M109 Gout, unspecified: Secondary | ICD-10-CM

## 2011-09-27 DIAGNOSIS — J9600 Acute respiratory failure, unspecified whether with hypoxia or hypercapnia: Secondary | ICD-10-CM | POA: Diagnosis present

## 2011-09-27 DIAGNOSIS — I10 Essential (primary) hypertension: Secondary | ICD-10-CM

## 2011-09-27 DIAGNOSIS — J9601 Acute respiratory failure with hypoxia: Secondary | ICD-10-CM

## 2011-09-27 DIAGNOSIS — D509 Iron deficiency anemia, unspecified: Secondary | ICD-10-CM | POA: Diagnosis present

## 2011-09-27 DIAGNOSIS — E669 Obesity, unspecified: Secondary | ICD-10-CM

## 2011-09-27 LAB — BLOOD GAS, ARTERIAL
Acid-base deficit: 2.1 mmol/L — ABNORMAL HIGH (ref 0.0–2.0)
Bicarbonate: 23 mEq/L (ref 20.0–24.0)
FIO2: 100 %
O2 Saturation: 92 %
Patient temperature: 98.6
TCO2: 24.4 mmol/L (ref 0–100)
pCO2 arterial: 46 mmHg — ABNORMAL HIGH (ref 35.0–45.0)
pH, Arterial: 7.321 — ABNORMAL LOW (ref 7.350–7.400)
pO2, Arterial: 64.8 mmHg — ABNORMAL LOW (ref 80.0–100.0)

## 2011-09-27 LAB — POCT I-STAT 3, ART BLOOD GAS (G3+)
Acid-base deficit: 1 mmol/L (ref 0.0–2.0)
Bicarbonate: 23.4 mEq/L (ref 20.0–24.0)
O2 Saturation: 100 %
TCO2: 25 mmol/L (ref 0–100)
pCO2 arterial: 35.7 mmHg (ref 35.0–45.0)
pH, Arterial: 7.425 — ABNORMAL HIGH (ref 7.350–7.400)
pO2, Arterial: 236 mmHg — ABNORMAL HIGH (ref 80.0–100.0)

## 2011-09-27 LAB — COMPREHENSIVE METABOLIC PANEL
ALT: 10 U/L (ref 0–35)
AST: 21 U/L (ref 0–37)
Albumin: 3 g/dL — ABNORMAL LOW (ref 3.5–5.2)
Alkaline Phosphatase: 58 U/L (ref 39–117)
BUN: 50 mg/dL — ABNORMAL HIGH (ref 6–23)
CO2: 23 mEq/L (ref 19–32)
Calcium: 8.9 mg/dL (ref 8.4–10.5)
Chloride: 102 mEq/L (ref 96–112)
Creatinine, Ser: 5.87 mg/dL — ABNORMAL HIGH (ref 0.50–1.10)
GFR calc Af Amer: 9 mL/min — ABNORMAL LOW (ref >90)
GFR calc non Af Amer: 8 mL/min — ABNORMAL LOW (ref >90)
Glucose, Bld: 179 mg/dL — ABNORMAL HIGH (ref 70–99)
Potassium: 3.9 mEq/L (ref 3.5–5.1)
Sodium: 137 mEq/L (ref 135–145)
Total Bilirubin: 0.2 mg/dL — ABNORMAL LOW (ref 0.3–1.2)
Total Protein: 6.7 g/dL (ref 6.0–8.3)

## 2011-09-27 LAB — BASIC METABOLIC PANEL
BUN: 49 mg/dL — ABNORMAL HIGH (ref 6–23)
CO2: 23 mEq/L (ref 19–32)
Calcium: 9 mg/dL (ref 8.4–10.5)
Chloride: 102 mEq/L (ref 96–112)
Creatinine, Ser: 6.11 mg/dL — ABNORMAL HIGH (ref 0.50–1.10)
GFR calc Af Amer: 9 mL/min — ABNORMAL LOW (ref >90)
GFR calc non Af Amer: 8 mL/min — ABNORMAL LOW (ref >90)
Glucose, Bld: 203 mg/dL — ABNORMAL HIGH (ref 70–99)
Potassium: 4.5 mEq/L (ref 3.5–5.1)
Sodium: 138 mEq/L (ref 135–145)

## 2011-09-27 LAB — URINALYSIS, ROUTINE W REFLEX MICROSCOPIC
Bilirubin Urine: NEGATIVE
Glucose, UA: 250 mg/dL — AB
Ketones, ur: NEGATIVE mg/dL
Nitrite: NEGATIVE
Protein, ur: >300 mg/dL — AB
Specific Gravity, Urine: 1.016 (ref 1.005–1.030)
Urobilinogen, UA: 0.2 mg/dL (ref 0.0–1.0)
pH: 7 (ref 5.0–8.0)

## 2011-09-27 LAB — DIFFERENTIAL
Basophils Absolute: 0 10*3/uL (ref 0.0–0.1)
Basophils Relative: 0 % (ref 0–1)
Eosinophils Absolute: 0 10*3/uL (ref 0.0–0.7)
Eosinophils Relative: 0 % (ref 0–5)
Lymphocytes Relative: 9 % — ABNORMAL LOW (ref 12–46)
Lymphs Abs: 1.1 10*3/uL (ref 0.7–4.0)
Monocytes Absolute: 0.5 10*3/uL (ref 0.1–1.0)
Monocytes Relative: 5 % (ref 3–12)
Neutro Abs: 9.7 10*3/uL — ABNORMAL HIGH (ref 1.7–7.7)
Neutrophils Relative %: 86 % — ABNORMAL HIGH (ref 43–77)

## 2011-09-27 LAB — CBC
HCT: 29.8 % — ABNORMAL LOW (ref 36.0–46.0)
HCT: 30.6 % — ABNORMAL LOW (ref 36.0–46.0)
Hemoglobin: 9.6 g/dL — ABNORMAL LOW (ref 12.0–15.0)
Hemoglobin: 10.1 g/dL — ABNORMAL LOW (ref 12.0–15.0)
MCH: 26.2 pg (ref 26.0–34.0)
MCH: 26.8 pg (ref 26.0–34.0)
MCHC: 32.2 g/dL (ref 30.0–36.0)
MCHC: 33 g/dL (ref 30.0–36.0)
MCV: 81.2 fL (ref 78.0–100.0)
MCV: 81.2 fL (ref 78.0–100.0)
Platelets: 211 10*3/uL (ref 150–400)
Platelets: 206 10*3/uL (ref 150–400)
RBC: 3.67 MIL/uL — ABNORMAL LOW (ref 3.87–5.11)
RBC: 3.77 MIL/uL — ABNORMAL LOW (ref 3.87–5.11)
RDW: 15.5 % (ref 11.5–15.5)
RDW: 15.3 % (ref 11.5–15.5)
WBC: 11.3 10*3/uL — ABNORMAL HIGH (ref 4.0–10.5)
WBC: 9.2 10*3/uL (ref 4.0–10.5)

## 2011-09-27 LAB — URINE MICROSCOPIC-ADD ON

## 2011-09-27 LAB — STREP PNEUMONIAE URINARY ANTIGEN: Strep Pneumo Urinary Antigen: NEGATIVE

## 2011-09-27 LAB — GLUCOSE, CAPILLARY
Glucose-Capillary: 186 mg/dL — ABNORMAL HIGH (ref 70–99)
Glucose-Capillary: 176 mg/dL — ABNORMAL HIGH (ref 70–99)

## 2011-09-27 LAB — EXPECTORATED SPUTUM ASSESSMENT W GRAM STAIN, RFLX TO RESP C

## 2011-09-27 LAB — PRO B NATRIURETIC PEPTIDE: Pro B Natriuretic peptide (BNP): 31625 pg/mL — ABNORMAL HIGH (ref 0–125)

## 2011-09-27 LAB — TROPONIN I: Troponin I: <0.3 ng/mL (ref <0.30)

## 2011-09-27 MED ORDER — DEXTROSE 5 % IV SOLN
1.0000 g | Freq: Once | INTRAVENOUS | Status: AC
  Administered 2011-09-27: 1 g via INTRAVENOUS
  Filled 2011-09-27: qty 10

## 2011-09-27 MED ORDER — ONDANSETRON HCL 4 MG/2ML IJ SOLN
INTRAMUSCULAR | Status: AC
  Administered 2011-09-27: 17:00:00
  Filled 2011-09-27: qty 2

## 2011-09-27 MED ORDER — CLONIDINE HCL 0.1 MG PO TABS
0.1000 mg | ORAL_TABLET | Freq: Two times a day (BID) | ORAL | Status: DC
  Administered 2011-09-27: 0.1 mg via ORAL
  Filled 2011-09-27 (×3): qty 1

## 2011-09-27 MED ORDER — AZITHROMYCIN 500 MG PO TABS
500.0000 mg | ORAL_TABLET | ORAL | Status: DC
  Administered 2011-09-27 – 2011-09-29 (×2): 500 mg via ORAL
  Filled 2011-09-27 (×4): qty 1

## 2011-09-27 MED ORDER — SODIUM CHLORIDE 0.9 % IJ SOLN
3.0000 mL | Freq: Two times a day (BID) | INTRAMUSCULAR | Status: DC
  Administered 2011-09-28 – 2011-09-29 (×2): 3 mL via INTRAVENOUS

## 2011-09-27 MED ORDER — DEXTROSE 5 % IV SOLN
500.0000 mg | Freq: Once | INTRAVENOUS | Status: AC
  Administered 2011-09-27: 500 mg via INTRAVENOUS
  Filled 2011-09-27: qty 500

## 2011-09-27 MED ORDER — AMLODIPINE BESYLATE 10 MG PO TABS
10.0000 mg | ORAL_TABLET | Freq: Every day | ORAL | Status: DC
  Administered 2011-09-27: 10 mg via ORAL
  Filled 2011-09-27 (×2): qty 1

## 2011-09-27 MED ORDER — HYDRALAZINE HCL 25 MG PO TABS
25.0000 mg | ORAL_TABLET | Freq: Four times a day (QID) | ORAL | Status: DC
  Filled 2011-09-27 (×5): qty 1

## 2011-09-27 MED ORDER — SODIUM CHLORIDE 0.9 % IV SOLN: 250.0000 mL | INTRAVENOUS | Status: DC | PRN

## 2011-09-27 MED ORDER — TORSEMIDE 20 MG PO TABS
40.0000 mg | ORAL_TABLET | Freq: Every day | ORAL | Status: DC
  Administered 2011-09-27: 40 mg via ORAL
  Filled 2011-09-27 (×2): qty 2

## 2011-09-27 MED ORDER — DEXTROSE 5 % IV SOLN
1.0000 g | INTRAVENOUS | Status: DC
  Administered 2011-09-27 – 2011-09-30 (×3): 1 g via INTRAVENOUS
  Filled 2011-09-27 (×4): qty 10

## 2011-09-27 MED ORDER — NEBIVOLOL HCL 20 MG PO TABS: 20.0000 mg | ORAL_TABLET | Freq: Every day | ORAL | Status: DC

## 2011-09-27 MED ORDER — HEPARIN SODIUM (PORCINE) 5000 UNIT/ML IJ SOLN
5000.0000 [IU] | Freq: Three times a day (TID) | INTRAMUSCULAR | Status: DC
  Administered 2011-09-27 – 2011-10-05 (×19): 5000 [IU] via SUBCUTANEOUS
  Filled 2011-09-27 (×23): qty 1

## 2011-09-27 MED ORDER — INSULIN ASPART 100 UNIT/ML ~~LOC~~ SOLN
0.0000 [IU] | Freq: Three times a day (TID) | SUBCUTANEOUS | Status: DC
  Administered 2011-09-28: 2 [IU] via SUBCUTANEOUS

## 2011-09-27 MED ORDER — METHYLPREDNISOLONE SODIUM SUCC 40 MG IJ SOLR
40.0000 mg | Freq: Two times a day (BID) | INTRAMUSCULAR | Status: DC
  Administered 2011-09-27: 40 mg via INTRAVENOUS
  Filled 2011-09-27 (×4): qty 1

## 2011-09-27 MED ORDER — ALBUTEROL SULFATE (5 MG/ML) 0.5% IN NEBU
2.5000 mg | INHALATION_SOLUTION | RESPIRATORY_TRACT | Status: DC | PRN
  Administered 2011-09-27: 2.5 mg via RESPIRATORY_TRACT
  Filled 2011-09-27: qty 0.5

## 2011-09-27 MED ORDER — NEBIVOLOL HCL 10 MG PO TABS
20.0000 mg | ORAL_TABLET | Freq: Every day | ORAL | Status: DC
  Administered 2011-09-27: 20 mg via ORAL
  Filled 2011-09-27 (×2): qty 2

## 2011-09-27 MED ORDER — SODIUM CHLORIDE 0.9 % IV SOLN
INTRAVENOUS | Status: DC
  Administered 2011-09-27: 17:00:00 via INTRAVENOUS

## 2011-09-27 MED ORDER — INSULIN GLARGINE 100 UNIT/ML ~~LOC~~ SOLN
10.0000 [IU] | Freq: Every day | SUBCUTANEOUS | Status: DC
  Administered 2011-09-27: 10 [IU] via SUBCUTANEOUS

## 2011-09-27 MED ORDER — ACETAMINOPHEN 500 MG PO TABS
1000.0000 mg | ORAL_TABLET | Freq: Once | ORAL | Status: DC
  Filled 2011-09-27: qty 2

## 2011-09-27 MED ORDER — TORSEMIDE 20 MG PO TABS: 40.0000 mg | ORAL_TABLET | Freq: Every day | ORAL | Status: DC

## 2011-09-27 MED ORDER — ASPIRIN EC 81 MG PO TBEC
81.0000 mg | DELAYED_RELEASE_TABLET | Freq: Every day | ORAL | Status: DC
  Administered 2011-09-27 – 2011-10-04 (×7): 81 mg via ORAL
  Filled 2011-09-27 (×9): qty 1

## 2011-09-27 MED ORDER — HYDRALAZINE HCL 20 MG/ML IJ SOLN
10.0000 mg | Freq: Four times a day (QID) | INTRAMUSCULAR | Status: DC | PRN
  Administered 2011-09-27: 10 mg via INTRAVENOUS
  Filled 2011-09-27: qty 0.5
  Filled 2011-09-27: qty 1

## 2011-09-27 MED ORDER — FERROUS SULFATE 325 (65 FE) MG PO TABS
325.0000 mg | ORAL_TABLET | Freq: Three times a day (TID) | ORAL | Status: DC
  Administered 2011-09-28 – 2011-10-04 (×13): 325 mg via ORAL
  Filled 2011-09-27 (×22): qty 1

## 2011-09-27 MED ORDER — SODIUM CHLORIDE 0.9 % IJ SOLN: 3.0000 mL | INTRAMUSCULAR | Status: DC | PRN

## 2011-09-27 MED ORDER — ACETAMINOPHEN 650 MG RE SUPP
650.0000 mg | Freq: Four times a day (QID) | RECTAL | Status: DC | PRN
  Administered 2011-09-27: 650 mg via RECTAL

## 2011-09-27 NOTE — Progress Notes (Signed)
Late entry: Patient arrived on floor around 18:40 on non-rebreather. Patient extremely SOB sats 98% on non-rebreather. Patient placed on telemetry. Patient stated she is unable to breath and her chest hurts. Rapid Response and M. Lynch NP notified. Dudley Major RN

## 2011-09-27 NOTE — Progress Notes (Signed)
Took patient off BIPAP based on ABG results.  Placed patient on 3L [place]. Sats 95%.  BBS diminished with some rales.  Patients work of breathing is much better than when she first arrived.

## 2011-09-27 NOTE — Progress Notes (Signed)
Subjective:  Patient reports worsening dyspnea and associated chest pain. Admitted from ED with cough and shortness of breath. Chest xray show right sided PNA.  Objective: Vital signs in last 24 hours: Temp:  [99.1 F (37.3 C)-102.7 F (39.3 C)] 99.1 F (37.3 C) (04/12 1849) Pulse Rate:  [87-97] 89  (04/12 1849) Resp:  [8-31] 17  (04/12 1849) BP: (182-204)/(73-82) 194/73 mmHg (04/12 1849) SpO2:  [67 %-100 %] 91 % (04/12 1942) FiO2 (%):  [30 %] 30 % (04/12 1415) Weight:  [70.171 kg (154 lb 11.2 oz)-70.761 kg (156 lb)] 70.171 kg (154 lb 11.2 oz) (04/12 1849) Weight change:     Intake/Output from previous day:   Intake/Output this shift:   Physical Examination: General appearance - ill-appearing, moderate distress Chest - tachyneic, rhonchi noted RUL, RLL, diminished sound LLL Heart - normal rate and regular rhythm  Lab Results:  Basename 09/27/11 1926 09/27/11 1505  WBC 9.2 11.3*  HGB 10.1* 9.6*  HCT 30.6* 29.8*  PLT 206 211   BMET  Basename 09/27/11 1505  NA 138  K 4.5  CL 102  CO2 23  GLUCOSE 203*  BUN 49*  CREATININE 6.11*  CALCIUM 9.0    Studies/Results: Dg Chest Port 1 View  09/27/2011  *RADIOLOGY REPORT*  Clinical Data: 41 year old female with severe shortness of breath. History of CHF and diabetes.  PORTABLE CHEST - 1 VIEW  Comparison: 03/29/2011.  Findings: AP portable semi upright view 1500 hours. Stable cardiomegaly and mediastinal contours.  Confluent opacity at the right lung base.  Mildly increased retrocardiac opacity also, obscuring the left hemidiaphragm.  There is early airspace disease in the right upper lobe along the minor fissure.  The left upper lobe is clear.  No pneumothorax or large effusion. Visualized tracheal air column is within normal limits.  IMPRESSION: Bilateral lower lobe and early right upper lobe airspace disease is nonspecific but suspicious for severe pneumonia.  Original Report Authenticated By: Randall An, M.D.     Medications: I have reviewed the patient's current medications.  Assessment/Plan: Respiratory failure secondary to community acquired PNA. Continue nebs, steroids and antibiotics as ordered. Will move to stepdown for closer monitoring. ABG pending. May need to resume Bipap.  LOS: 0 days   [facility], Emaya Preston A. 09/27/2011, 7:55 PM

## 2011-09-27 NOTE — ED Notes (Signed)
Pt's MD Eulis Foster notified of results of xray

## 2011-09-27 NOTE — ED Provider Notes (Signed)
History     CSN: [id number]  Arrival date & time 09/27/11  1439   First MD Initiated Contact with Patient 09/27/11 1501      Chief Complaint  Patient presents with  . Shortness of Breath  . Fever    (Consider location/radiation/quality/duration/timing/severity/associated sxs/prior treatment) HPI Comments: [name] is a 41 y.o. Female who presents with shortness of breath that started today. She is in the process of being evaluated for dialysis. She is due to come into the hospital in 5 days for a vascular access. She is seeing her nephrologist regularly.  EMS was called. Her home today. They found her hypoxic ann placed her on CPAP. In the emergency department, she desaturated when the CPAP was removed to place her on BiPAP. Currently in emergency department. She is on BiPAP and able to talk it is difficult to understand her   Level V Caveat- Serious illness    Patient is a 41 y.o. female presenting with shortness of breath and fever. The history is provided by the patient.  Shortness of Breath  Associated symptoms include a fever and shortness of breath.  Fever Primary symptoms of the febrile illness include fever and shortness of breath.    Past Medical History  Diagnosis Date  . Chronic kidney disease   . Hypertension   . Diabetes mellitus   . Retinopathy   . Metabolic bone disease   . Anemia     History reviewed. No pertinent past surgical history.  Family History  Problem Relation Age of Onset  . Malignant hyperthermia Mother   . Malignant hyperthermia Father     History  Substance Use Topics  . Smoking status: Not on file  . Smokeless tobacco: Not on file  . Alcohol Use: No    OB History    Grav Para Term Preterm Abortions TAB SAB Ect Mult Living                  Review of Systems  Unable to perform ROS Constitutional: Positive for fever.  Respiratory: Positive for shortness of breath.     Allergies  Review of patient's allergies  indicates no known allergies.  Home Medications   Current Outpatient Rx  Name Route Sig Dispense Refill  . AMLODIPINE BESYLATE 10 MG PO TABS Oral Take 10 mg by mouth daily.    . ASPIRIN EC 81 MG PO TBEC Oral Take 81 mg by mouth daily.    Marland Kitchen CLONIDINE HCL 0.1 MG PO TABS Oral Take 0.1 mg by mouth.    Lyndle Herrlich SULFATE 325 (65 FE) MG PO TABS Oral Take 325 mg by mouth 3 (three) times daily with meals.    Marland Kitchen HYDRALAZINE HCL 25 MG PO TABS Oral Take 25 mg by mouth 4 (four) times daily.     . INSULIN LISPRO PROT & LISPRO (75-25) 100 UNIT/ML [place] SUSP Subcutaneous Inject 10 Units into the skin 2 (two) times daily with a meal.     . NEBIVOLOL HCL 20 MG PO TABS Oral Take 20 mg by mouth daily.    . TORSEMIDE 20 MG PO TABS Oral Take 40 mg by mouth daily.       BP 204/80  Pulse 92  Temp(Src) 102.7 F (39.3 C) (Rectal)  Resp 10  Ht 5\' 2"  (1.575 m)  Wt 156 lb (70.761 kg)  BMI 28.53 kg/m2  SpO2 100%  Physical Exam  Nursing note and vitals reviewed. Constitutional: She appears well-developed and well-nourished. She appears distressed (  Moderate respiratory distress).  HENT:  Head: Normocephalic and atraumatic.  Eyes: Conjunctivae and EOM are normal. Pupils are equal, round, and reactive to light.  Neck: Normal range of motion and phonation normal. Neck supple.  Cardiovascular: Normal rate, regular rhythm and intact distal pulses.   Pulmonary/Chest: She is in respiratory distress. She has wheezes. She has no rales. She exhibits no tenderness.       Bilateral rhonchi with scattered end expiratory wheezes  Abdominal: Soft. She exhibits no distension. There is no tenderness. There is no guarding.  Musculoskeletal: Normal range of motion. She exhibits edema (Bilateral pretibial edema).  Neurological: She is alert. She has normal strength. She exhibits normal muscle tone.  Skin: Skin is warm and dry.  Psychiatric: She has a normal mood and affect. Her behavior is normal.    ED Course  Procedures  (including critical care time)  Emergency to treatment: Converted to BiPAP on admission. Oxygen manipulated to maintain sats greater than 90%. Face mask changed to accommodate facial structure. Arterial blood gas indicates good oxygenation, so she was changed to Ventimask. She tolerated this well and became able to talk. She relates having cough with yellow sputum for 24 hours. Is also had low-grade fever and decreased appetite. Treatment initiated for community-acquired pneumonia with Rocephin and Zithromax. Temperature obtained rectally and is elevated. The patient experienced nausea and vomiting and Zofran ordered. Labs evaluated and indicated . Stable chronic renal failure, and elevated leukocytes. Arterial blood gas indicates hyperoxia and is otherwise normal. Case was discussed with Dr. Hassell Done; to inform him that this renal patient is getting admitted to the hospital. I will contact the on-call hospitalist to admit to a medical bed.  CRITICAL CARE Performed by: Richarda Blade   Total critical care time:45 min  Critical care time was exclusive of separately billable procedures and treating other patients.  Critical care was necessary to treat or prevent imminent or life-threatening deterioration.  Critical care was time spent personally by me on the following activities: development of treatment plan with patient and/or surrogate as well as nursing, discussions with consultants, evaluation of patient's response to treatment, examination of patient, obtaining history from patient or surrogate, ordering and performing treatments and interventions, ordering and review of laboratory studies, ordering and review of radiographic studies, pulse oximetry and re-evaluation of patient's condition.     Date: 09/27/2011  Rate: 93  Rhythm: normal sinus rhythm  QRS Axis: normal  Intervals: normal  ST/T Wave abnormalities: normal  Conduction Disutrbances:none  Narrative Interpretation:   Old EKG  Reviewed: changes noted-rate slower   Labs Reviewed  CBC - Abnormal; Notable for the following:    WBC 11.3 (*)    RBC 3.67 (*)    Hemoglobin 9.6 (*)    HCT 29.8 (*)    All other components within normal limits  DIFFERENTIAL - Abnormal; Notable for the following:    Neutrophils Relative 86 (*)    Neutro Abs 9.7 (*)    Lymphocytes Relative 9 (*)    All other components within normal limits  BASIC METABOLIC PANEL - Abnormal; Notable for the following:    Glucose, Bld 203 (*)    BUN 49 (*)    Creatinine, Ser 6.11 (*)    GFR calc non Af Amer 8 (*)    GFR calc Af Amer 9 (*)    All other components within normal limits  PRO B NATRIURETIC PEPTIDE - Abnormal; Notable for the following:    Pro B Natriuretic peptide (BNP)  31625.0 (*)    All other components within normal limits  URINALYSIS, ROUTINE W REFLEX MICROSCOPIC - Abnormal; Notable for the following:    APPearance CLOUDY (*)    Glucose, UA 250 (*)    Hgb urine dipstick MODERATE (*)    Protein, ur >300 (*)    Leukocytes, UA SMALL (*)    All other components within normal limits  POCT I-STAT 3, BLOOD GAS (G3+) - Abnormal; Notable for the following:    pH, Arterial 7.425 (*)    pO2, Arterial 236.0 (*)    All other components within normal limits  URINE MICROSCOPIC-ADD ON - Abnormal; Notable for the following:    Squamous Epithelial / LPF MANY (*)    Bacteria, UA FEW (*)    Crystals CA OXALATE CRYSTALS (*)    All other components within normal limits  TROPONIN I  CULTURE, BLOOD (ROUTINE X 2)  CULTURE, BLOOD (ROUTINE X 2)   Dg Chest Port 1 View  09/27/2011  *RADIOLOGY REPORT*  Clinical Data: 41 year old female with severe shortness of breath. History of CHF and diabetes.  PORTABLE CHEST - 1 VIEW  Comparison: 03/29/2011.  Findings: AP portable semi upright view 1500 hours. Stable cardiomegaly and mediastinal contours.  Confluent opacity at the right lung base.  Mildly increased retrocardiac opacity also, obscuring the left  hemidiaphragm.  There is early airspace disease in the right upper lobe along the minor fissure.  The left upper lobe is clear.  No pneumothorax or large effusion. Visualized tracheal air column is within normal limits.  IMPRESSION: Bilateral lower lobe and early right upper lobe airspace disease is nonspecific but suspicious for severe pneumonia.  Original Report Authenticated By: Randall An, M.D.     1. Community acquired pneumonia   2. Renal insufficiency       MDM  Pneumonia with stable chronic renal insufficiency. Patient not currently under dialysis. She had increased work of breathing earlier, but it has improved, with treatment initiated. She'll need to be admitted for stabilization.   Plan: Admit- Hospitalist, consult Renal        Richarda Blade, MD 09/27/11 1819

## 2011-09-27 NOTE — ED Notes (Signed)
Pt to ED via EMS with c/o SOB, and fever.  SOB began today while fever onset x4 days.  Pt with CPAP intact upon arrival with Sats at 80%.  Pt given 1 NTG SL and 35mg  Lasix.

## 2011-09-27 NOTE — Progress Notes (Signed)
Patient came in via EMS on CPAP of 7.5 and FIO2 of 30%.  Sats were 68.  RT placed patient on BIPAP 12/6 100%.  Patients sats came up to 100%.  BBS are still very diminished with some Rales.  WOB is increased when off of BIPAP.  RT will continue to monitor.

## 2011-09-27 NOTE — H&P (Signed)
Hospital Admission Note Date: 09/27/2011  PCP: Robyn Haber, MD, MD  Chief Complaint: Cough and shortness  History of Present Illness: This is a 41 year old female with past medical history of chronic renal disease stage IV with baseline creatinine of 5.5-6 not analysis, also past medical history of hypertension, diabetes with an unknown hemoglobin A1c that comes in for cough and shortness of breath. She relates this has progressively gotten worse. She relates this started 4 days prior to admission with productive cough and 3 days prior to admission she started developing shortness of breath. Here in the emergency room had to put her on CPAP and she was saturating in the 80s. Currently here in the ED she is on a Venturi mask satting greater than 90%. She relates she has had no fever, no diarrhea, no abdominal pain, no burning when she urinates or weakness. She relates no hemoptysis PND or edema.  Allergies: Review of patient's allergies indicates no known allergies. Past Medical History  Diagnosis Date  . Chronic kidney disease   . Hypertension   . Diabetes mellitus   . Retinopathy   . Metabolic bone disease   . Anemia    Prior to Admission medications   Medication Sig Start Date End Date Taking? Authorizing Provider  amLODipine (NORVASC) 10 MG tablet Take 10 mg by mouth daily.   Yes Historical Provider, MD  aspirin EC 81 MG tablet Take 81 mg by mouth daily.   Yes Historical Provider, MD  cloNIDine (CATAPRES) 0.1 MG tablet Take 0.1 mg by mouth.   Yes Historical Provider, MD  ferrous sulfate 325 (65 FE) MG tablet Take 325 mg by mouth 3 (three) times daily with meals.   Yes Historical Provider, MD  hydrALAZINE (APRESOLINE) 25 MG tablet Take 25 mg by mouth 4 (four) times daily.    Yes Historical Provider, MD  insulin lispro protamine-insulin lispro (HUMALOG 75/25) (75-25) 100 UNIT/ML SUSP Inject 10 Units into the skin 2 (two) times daily with a meal.    Yes Historical Provider, MD    Nebivolol HCl (BYSTOLIC) 20 MG TABS Take 20 mg by mouth daily.   Yes Historical Provider, MD  torsemide (DEMADEX) 20 MG tablet Take 40 mg by mouth daily.    Yes Historical Provider, MD   History reviewed. No pertinent past surgical history. Family History  Problem Relation Age of Onset  . Malignant hyperthermia Mother   . Malignant hyperthermia Father    History   Social History  . Marital Status: Single    Spouse Name: N/A    Number of Children: N/A  . Years of Education: N/A   Occupational History  . Not on file.   Social History Main Topics  . Smoking status: Not on file  . Smokeless tobacco: Not on file  . Alcohol Use: No  . Drug Use: No  . Sexually Active: No   Other Topics Concern  . Not on file   Social History Narrative  . No narrative on file    REVIEW OF SYSTEMS:  Constitutional:  No weight loss, night sweats, Fevers, chills, fatigue.  HEENT:  No headaches, Difficulty swallowing,Tooth/dental problems,Sore throat,  No sneezing, itching, ear ache, nasal congestion, post nasal drip,  Cardio-vascular:  No chest pain, Orthopnea, PND, swelling in lower extremities, anasarca, dizziness, palpitations  GI:  No heartburn, indigestion, abdominal pain, nausea, vomiting, diarrhea, change in bowel habits, loss of appetite  Resp:   No excess mucus, no productive cough,No chest wall deformity  Skin:  no rash  or lesions.  GU:  no dysuria, change in color of urine, no urgency or frequency. No flank pain.  Musculoskeletal:  No joint pain or swelling. No decreased range of motion. No back pain.  Psych:  No change in mood or affect. No depression or anxiety. No memory loss.   Physical Exam: Filed Vitals:   09/27/11 1610 09/27/11 1616 09/27/11 1634 09/27/11 1648  BP:   204/80   Pulse: 97  92   Temp:    102.7 F (39.3 C)  TempSrc:    Rectal  Resp: 18  10   Height:  5\' 2"  (1.575 m)    Weight:  70.761 kg (156 lb)    SpO2: 95%  100%    No intake or output data in  the 24 hours ending 09/27/11 1726 BP 204/80  Pulse 92  Temp(Src) 102.7 F (39.3 C) (Rectal)  Resp 10  Ht 5\' 2"  (1.575 m)  Wt 70.761 kg (156 lb)  BMI 28.53 kg/m2  SpO2 100%  General Appearance:    Alert, cooperative, no distress, appears stated age  Head:    Normocephalic, without obvious abnormality, atraumatic  Eyes:    PERRL, conjunctiva/corneas clear, EOM's intact, fundi    benign, both eyes  Ears:    Normal TM's and external ear canals, both ears  Nose:   Nares normal, septum midline, mucosa normal, no drainage    or sinus tenderness  Throat:   Lips, mucosa, and tongue normal; teeth and gums normal  Neck:   Supple, symmetrical, trachea midline, no adenopathy;    thyroid:  no enlargement/tenderness/nodules; no carotid   bruit or JVD  Back:     Symmetric, no curvature, ROM normal, no CVA tenderness  Lungs:     good air movement slightly labored, with some crackles diffusely and mild wheezing bilaterally.   Chest Wall:    No tenderness or deformity   Heart:    Regular rate and rhythm, S1 and S2 normal, no murmur, rub   or gallop     Abdomen:     Soft, non-tender, bowel sounds active all four quadrants,    no masses, no organomegaly        Extremities:   Extremities normal, atraumatic, no cyanosis or edema  Pulses:   2+ and symmetric all extremities  Skin:   Skin color, texture, turgor normal, no rashes or lesions  Lymph nodes:   Cervical, supraclavicular, and axillary nodes normal  Neurologic:   CNII-XII intact, normal strength, sensation and reflexes    throughout   Lab results:  Basename 09/27/11 1505  NA 138  K 4.5  CL 102  CO2 23  GLUCOSE 203*  BUN 49*  CREATININE 6.11*  CALCIUM 9.0  MG --  PHOS --   No results found for this basename: AST:2,ALT:2,ALKPHOS:2,BILITOT:2,PROT:2,ALBUMIN:2 in the last 72 hours No results found for this basename: LIPASE:2,AMYLASE:2 in the last 72 hours  Basename 09/27/11 1505  WBC 11.3*  NEUTROABS 9.7*  HGB 9.6*  HCT 29.8*    MCV 81.2  PLT 211    Basename 09/27/11 1510  CKTOTAL --  CKMB --  CKMBINDEX --  TROPONINI <0.30   No components found with this basename: POCBNP:3 No results found for this basename: DDIMER:2 in the last 72 hours No results found for this basename: HGBA1C:2 in the last 72 hours No results found for this basename: CHOL:2,HDL:2,LDLCALC:2,TRIG:2,CHOLHDL:2,LDLDIRECT:2 in the last 72 hours No results found for this basename: TSH,T4TOTAL,FREET3,T3FREE,THYROIDAB in the last 72 hours No results  found for this basename: VITAMINB12:2,FOLATE:2,FERRITIN:2,TIBC:2,IRON:2,RETICCTPCT:2 in the last 72 hours Imaging results:  Dg Chest Port 1 View  09/27/2011  *RADIOLOGY REPORT*  Clinical Data: 41 year old female with severe shortness of breath. History of CHF and diabetes.  PORTABLE CHEST - 1 VIEW  Comparison: 03/29/2011.  Findings: AP portable semi upright view 1500 hours. Stable cardiomegaly and mediastinal contours.  Confluent opacity at the right lung base.  Mildly increased retrocardiac opacity also, obscuring the left hemidiaphragm.  There is early airspace disease in the right upper lobe along the minor fissure.  The left upper lobe is clear.  No pneumothorax or large effusion. Visualized tracheal air column is within normal limits.  IMPRESSION: Bilateral lower lobe and early right upper lobe airspace disease is nonspecific but suspicious for severe pneumonia.  Original Report Authenticated By: Randall An, M.D.   Other results: EKG:Marland Kitchen   Patient Active Hospital Problem List: Acute respiratory failure with hypoxia (09/27/2011) -This probably secondary to her community-acquired pneumonia, we'll keep her on a Venturi mask. We'll give her albuterol treatments and will start her on IV steroids as she has mild wheezing we'll continue monitor her sats very closely. An ABG was done here in the ED and showed her CO2 at 35 and her PO2 at 236 that was on BiPAP FiO2 of 30%.  PNA (pneumonia)  (09/27/2011)/fevers and leukocytosis -We'll continue her on Rocephin and azithromycin will get sputum cultures x2, will continue monitor her fever curve. Also check a urine Legionella. We'll admit her to the MedSurg unit.   DIABETES MELLITUS II, UNCOMPLICATED (99991111) -At home she is on 70/30. We'll put her on a renal carpal modify diet we'll start her on Lantus low dose and sliding scale insulin. We'll also check a hemoglobin A1c.   HTN (hypertension), malignant (0000000) -Systolic blood pressure greater than 180, this could be a component of agitation secondary to her hypoxia. I repeated blood pressure here in the emergency room showed a blood pressure of 204/80. We'll resume her blood pressure medications from home she is currently asymptomatic with no vision changes. We'll use hydralazine for systolic blood pressure greater than 180.   BMP was checked here in the ED and it was 31,000. I will continue her torsemide scheduling dose. And continue all her other blood pressure medications.  Chronic kidney disease, stage 4, severely decreased GFR (09/27/2011) -I have talked to Dr. Hassell Done to get me records from her dialysis center. He relates it she usually runs around 6.0. She has not started dialysis. And has good urine output. We'll continue monitor her creatinine on a daily basis.    Code Status: Full code Family Communication: Mother [id number]   Charlynne Cousins M.D. [facility] [phone] 09/27/2011, 5:26 PM

## 2011-09-27 NOTE — Progress Notes (Signed)
Pt being transferred to stepdown unit(Rm [id number]), called report to RN/Bobby. Family at bedside at time of transfer, belongings given to family  Alinda Deem

## 2011-09-27 NOTE — Progress Notes (Signed)
Patient desaturated into the low 70s when placed on a bed pan.  RT increased to a NRB.  Sats ar now 98%.  Will try to wean to a venturi mask as tolerated.  RT will continue to monitor.

## 2011-09-28 ENCOUNTER — Inpatient Hospital Stay (HOSPITAL_COMMUNITY): Payer: Medicaid Other

## 2011-09-28 ENCOUNTER — Encounter (HOSPITAL_COMMUNITY): Payer: Self-pay | Admitting: Pulmonary Disease

## 2011-09-28 LAB — HEMOGLOBIN A1C
Hgb A1c MFr Bld: 6.1 % — ABNORMAL HIGH (ref <5.7)
Mean Plasma Glucose: 128 mg/dL — ABNORMAL HIGH (ref <117)

## 2011-09-28 LAB — PHOSPHORUS: Phosphorus: 4.8 mg/dL — ABNORMAL HIGH (ref 2.3–4.6)

## 2011-09-28 LAB — GLUCOSE, CAPILLARY
Glucose-Capillary: 164 mg/dL — ABNORMAL HIGH (ref 70–99)
Glucose-Capillary: 111 mg/dL — ABNORMAL HIGH (ref 70–99)
Glucose-Capillary: 106 mg/dL — ABNORMAL HIGH (ref 70–99)

## 2011-09-28 LAB — HIV ANTIBODY (ROUTINE TESTING W REFLEX): HIV: NONREACTIVE

## 2011-09-28 LAB — MRSA PCR SCREENING: MRSA by PCR: NEGATIVE

## 2011-09-28 LAB — HEPATITIS B CORE ANTIBODY, TOTAL: Hep B Core Total Ab: NEGATIVE

## 2011-09-28 MED ORDER — HYDRALAZINE HCL 20 MG/ML IJ SOLN: 10.0000 mg | Freq: Four times a day (QID) | INTRAMUSCULAR | Status: DC | PRN

## 2011-09-28 MED ORDER — NEPRO/CARBSTEADY PO LIQD
237.0000 mL | ORAL | Status: DC | PRN
  Filled 2011-09-28: qty 237

## 2011-09-28 MED ORDER — LORAZEPAM 2 MG/ML IJ SOLN
INTRAMUSCULAR | Status: AC
  Administered 2011-09-28: 0.5 mg via INTRAVENOUS
  Filled 2011-09-28: qty 1

## 2011-09-28 MED ORDER — HEPARIN SODIUM (PORCINE) 1000 UNIT/ML DIALYSIS
1000.0000 [IU] | INTRAMUSCULAR | Status: DC | PRN
  Filled 2011-09-28: qty 1

## 2011-09-28 MED ORDER — METHYLPREDNISOLONE SODIUM SUCC 125 MG IJ SOLR
125.0000 mg | Freq: Two times a day (BID) | INTRAMUSCULAR | Status: DC
  Administered 2011-09-28: 125 mg via INTRAVENOUS
  Filled 2011-09-28 (×3): qty 2

## 2011-09-28 MED ORDER — PENTAFLUOROPROP-TETRAFLUOROETH EX AERO: 1.0000 "application " | INHALATION_SPRAY | CUTANEOUS | Status: DC | PRN

## 2011-09-28 MED ORDER — SODIUM CHLORIDE 0.9 % IV SOLN: 100.0000 mL | INTRAVENOUS | Status: DC | PRN

## 2011-09-28 MED ORDER — CHLORHEXIDINE GLUCONATE 0.12 % MT SOLN
15.0000 mL | Freq: Two times a day (BID) | OROMUCOSAL | Status: DC
  Administered 2011-09-28 – 2011-10-05 (×10): 15 mL via OROMUCOSAL
  Filled 2011-09-28 (×15): qty 15

## 2011-09-28 MED ORDER — HEPARIN SODIUM (PORCINE) 1000 UNIT/ML DIALYSIS
20.0000 [IU]/kg | INTRAMUSCULAR | Status: DC | PRN
  Filled 2011-09-28: qty 2

## 2011-09-28 MED ORDER — BIOTENE DRY MOUTH MT LIQD
15.0000 mL | Freq: Two times a day (BID) | OROMUCOSAL | Status: DC
  Administered 2011-09-29 – 2011-10-04 (×10): 15 mL via OROMUCOSAL

## 2011-09-28 MED ORDER — LIDOCAINE HCL (PF) 1 % IJ SOLN: 5.0000 mL | INTRAMUSCULAR | Status: DC | PRN

## 2011-09-28 MED ORDER — ALBUTEROL SULFATE (5 MG/ML) 0.5% IN NEBU
2.5000 mg | INHALATION_SOLUTION | RESPIRATORY_TRACT | Status: DC
  Administered 2011-09-28 – 2011-09-30 (×11): 2.5 mg via RESPIRATORY_TRACT
  Filled 2011-09-28 (×12): qty 0.5

## 2011-09-28 MED ORDER — LIDOCAINE-PRILOCAINE 2.5-2.5 % EX CREA
1.0000 "application " | TOPICAL_CREAM | CUTANEOUS | Status: DC | PRN
  Filled 2011-09-28: qty 5

## 2011-09-28 MED ORDER — CLONIDINE HCL 0.2 MG/24HR TD PTWK
0.2000 mg | MEDICATED_PATCH | TRANSDERMAL | Status: DC
  Administered 2011-09-28: 0.2 mg via TRANSDERMAL
  Filled 2011-09-28: qty 1

## 2011-09-28 MED ORDER — LORAZEPAM 2 MG/ML IJ SOLN
0.5000 mg | Freq: Once | INTRAMUSCULAR | Status: AC
  Administered 2011-09-28: 0.5 mg via INTRAVENOUS

## 2011-09-28 MED ORDER — RENA-VITE PO TABS
1.0000 | ORAL_TABLET | Freq: Every day | ORAL | Status: DC
  Administered 2011-09-29 – 2011-10-04 (×6): 1 via ORAL
  Filled 2011-09-28 (×8): qty 1

## 2011-09-28 MED ORDER — FUROSEMIDE 10 MG/ML IJ SOLN
80.0000 mg | Freq: Once | INTRAMUSCULAR | Status: AC
  Administered 2011-09-28: 80 mg via INTRAVENOUS

## 2011-09-28 MED ORDER — SODIUM CHLORIDE 0.9 % IJ SOLN
INTRAMUSCULAR | Status: AC
  Administered 2011-09-29: 3 mL via INTRAVENOUS
  Filled 2011-09-28: qty 50

## 2011-09-28 MED ORDER — FUROSEMIDE 10 MG/ML IJ SOLN
INTRAMUSCULAR | Status: AC
  Administered 2011-09-28: 80 mg via INTRAVENOUS
  Filled 2011-09-28: qty 8

## 2011-09-28 MED ORDER — HYDRALAZINE HCL 20 MG/ML IJ SOLN
10.0000 mg | Freq: Four times a day (QID) | INTRAMUSCULAR | Status: DC | PRN
  Administered 2011-09-28: 10 mg via INTRAVENOUS
  Administered 2011-09-29: 06:00:00 via INTRAVENOUS
  Administered 2011-09-29 – 2011-10-03 (×5): 10 mg via INTRAVENOUS
  Filled 2011-09-28: qty 0.5
  Filled 2011-09-28 (×6): qty 1
  Filled 2011-09-28: qty 0.5

## 2011-09-28 MED ORDER — LABETALOL HCL 5 MG/ML IV SOLN
0.5000 mg/min | INTRAVENOUS | Status: DC
  Filled 2011-09-28: qty 100

## 2011-09-28 MED ORDER — FUROSEMIDE 10 MG/ML IJ SOLN
160.0000 mg | Freq: Once | INTRAVENOUS | Status: AC
  Administered 2011-09-28: 160 mg via INTRAVENOUS
  Filled 2011-09-28: qty 16

## 2011-09-28 MED ORDER — INSULIN GLARGINE 100 UNIT/ML ~~LOC~~ SOLN: 20.0000 [IU] | Freq: Every day | SUBCUTANEOUS | Status: DC

## 2011-09-28 MED ORDER — ALTEPLASE 2 MG IJ SOLR
2.0000 mg | Freq: Once | INTRAMUSCULAR | Status: AC | PRN
  Filled 2011-09-28: qty 2

## 2011-09-28 NOTE — Progress Notes (Signed)
Patient not diuresing with high dose Lasix, CXR with diffuse edema and on BiPap support. HD cath placed for acute HD tonight.    Kelly Splinter  MD [employer] 09/28/2011, 6:52 PM

## 2011-09-28 NOTE — Progress Notes (Signed)
09/28/11 0400  Pt has 5 beat run of VTach. [facility] aware.   No new orders given.   Wyn Quaker RN

## 2011-09-28 NOTE — Progress Notes (Signed)
Pt placed on 100% Non RB per MD request at [facility]. Pt sats maintained at 95%. MD order to keep sats greater or equal to 90%. Rn will cont to monitor.

## 2011-09-28 NOTE — Consult Note (Signed)
[name] 09/28/2011 [place] Requesting Physician:  Dr. Aileen Fass  Reason for Consult:  CKD IV to V patient with SOB,  HPI: The patient is a 41 y.o. year-old with known CKD IV f/b Dr. Posey Pronto at [facility]  She has a baseline creat of 5-6 and had an appt for an access to be placed next week. She was admitted yesterday with SOB. CXR shows bilat infiltrates suggestive of pulm edema. She is on BiPap right now due to oxygen desaturation. She has rec'd 80 mg IV laix, UOP is poor. Foley is being placed now.  Patient endorses nausea and vomiting at home off and on.  No other compaints besides active dyspnea.   She was here in Oct 2012 with renal w/u showing negative testing for HIV, neg ANA, no M-spike on urine IFE or SPEP, neg Hep B and C Ab's, normal serum FLC ratio.   Creatinine, Ser  Date/Time Value Range Status  09/27/2011  7:26 PM 5.87* 0.50-1.10 (mg/dL) Final  09/27/2011  3:05 PM 6.11* 0.50-1.10 (mg/dL) Final  04/04/2011  4:53 AM 5.48* 0.50-1.10 (mg/dL) Final  04/03/2011  5:24 AM 5.78* 0.50-1.10 (mg/dL) Final  04/03/2011  5:20 AM 5.87* 0.50-1.10 (mg/dL) Final  04/02/2011  5:14 AM 5.95* 0.50-1.10 (mg/dL) Final  04/01/2011  5:06 AM 5.25* 0.50-1.10 (mg/dL) Final  03/31/2011  5:59 AM 5.03* 0.50-1.10 (mg/dL) Final  03/30/2011  8:40 AM 4.54* 0.50-1.10 (mg/dL) Final  03/29/2011  7:09 PM 4.24* 0.50-1.10 (mg/dL) Final  12/25/2009  9:52 PM 1.38* 0.40-1.20 (mg/dL) Final  04/28/2009 10:17 PM 1.14  0.40-1.20 (mg/dL) Final  03/31/2009  7:55 PM 1.00  0.40-1.20 (mg/dL) Final  08/06/2007  8:29 PM 0.84  0.40-1.20 (mg/dL) Final  01/02/2007  6:39 PM 0.90  0.40-1.20 (mg/dL) Final    Past Medical History:  Past Medical History  Diagnosis Date  . Chronic kidney disease   . Hypertension   . Diabetes mellitus   . Retinopathy   . Metabolic bone disease   . Anemia     Past Surgical History: History reviewed. No pertinent past surgical history.  Family History:  Family History  Problem Relation Age  of Onset  . Malignant hyperthermia Mother   . Malignant hyperthermia Father    Social History:  reports that she has never smoked. She does not have any smokeless tobacco history on file. She reports that she does not drink alcohol or use illicit drugs.  Allergies: No Known Allergies  Home medications: Prior to Admission medications   Medication Sig Start Date End Date Taking? Authorizing Provider  amLODipine (NORVASC) 10 MG tablet Take 10 mg by mouth daily.   Yes Historical Provider, MD  aspirin EC 81 MG tablet Take 81 mg by mouth daily.   Yes Historical Provider, MD  cloNIDine (CATAPRES) 0.1 MG tablet Take 0.1 mg by mouth.   Yes Historical Provider, MD  ferrous sulfate 325 (65 FE) MG tablet Take 325 mg by mouth 3 (three) times daily with meals.   Yes Historical Provider, MD  hydrALAZINE (APRESOLINE) 25 MG tablet Take 25 mg by mouth 4 (four) times daily.    Yes Historical Provider, MD  insulin lispro protamine-insulin lispro (HUMALOG 75/25) (75-25) 100 UNIT/ML SUSP Inject 10 Units into the skin 2 (two) times daily with a meal.    Yes Historical Provider, MD  Nebivolol HCl (BYSTOLIC) 20 MG TABS Take 20 mg by mouth daily.   Yes Historical Provider, MD  torsemide (DEMADEX) 20 MG tablet Take 40 mg by mouth daily.  Yes Historical Provider, MD    Inpatient medications:    . albuterol  2.5 mg Nebulization Q4H  . antiseptic oral rinse  15 mL Mouth Rinse q12n4p  . aspirin EC  81 mg Oral Daily  . azithromycin (ZITHROMAX) 500 MG IVPB  500 mg Intravenous Once  . azithromycin  500 mg Oral Q24H  . cefTRIAXone (ROCEPHIN)  IV  1 g Intravenous Once  . cefTRIAXone (ROCEPHIN)  IV  1 g Intravenous Q24H  . chlorhexidine  15 mL Mouth Rinse BID  . cloNIDine  0.2 mg Transdermal Weekly  . ferrous sulfate  325 mg Oral TID WC  . furosemide  80 mg Intravenous Once  . heparin  5,000 Units Subcutaneous Q8H  . insulin aspart  0-9 Units Subcutaneous TID WC  . insulin glargine  20 Units Subcutaneous QHS  .  methylPREDNISolone (SOLU-MEDROL) injection  125 mg Intravenous Q12H  . ondansetron      . sodium chloride  3 mL Intravenous Q12H  . DISCONTD: acetaminophen  1,000 mg Oral Once  . DISCONTD: amLODipine  10 mg Oral Daily  . DISCONTD: cloNIDine  0.1 mg Oral BID  . DISCONTD: hydrALAZINE  25 mg Oral QID  . DISCONTD: insulin glargine  10 Units Subcutaneous QHS  . DISCONTD: methylPREDNISolone (SOLU-MEDROL) injection  40 mg Intravenous Q12H  . DISCONTD: nebivolol  20 mg Oral Daily  . DISCONTD: Nebivolol HCl  20 mg Oral Daily  . DISCONTD: torsemide  40 mg Oral Daily  . DISCONTD: torsemide  40 mg Oral Daily    Review of Systems Gen:  Denies headache, fever, chills, sweats.  No weight loss. HEENT:  No visual change, sore throat, difficulty swallowing. Resp:  As above. No prod cough or hemoptysis. Cardiac:  +LE edema, severe SOB. No CP.  GI:   Denies abdominal pain. GU:  Denies difficulty or change in voiding.  No change in urine color.     MS:  Denies joint pain or swelling.   Derm:  Denies skin rash or itching.  No chronic skin conditions.  Neuro:   Denies focal weakness, memory problems, hx stroke or TIA.   Psych:  Denies symptoms of depression of anxiety.  No hallucination.    Physical Exam:  Blood pressure 192/72, pulse 94, temperature 98.9 F (37.2 C), temperature source Axillary, resp. rate 33, height 5\' 2"  (1.575 m), weight 69.4 kg (153 lb), last menstrual period 09/16/2011, SpO2 92.00%.  Gen: frail, calm, in biPap, respirations 24/min Skin: no rash, cyanosis Neck: + JVD to angle of jaw, bruits or LAN Chest: bibasilar crackles Heart: regular, no rub or gallop Abdomen: soft, nontender, no ascites, no masses or HSM Ext: 1-2+ pitting edema bilat LE's Neuro: alert, Ox3, no focal deficit Heme/Lymph: no bruising or LAN  Labs: Basic Metabolic Panel:  Lab [id number] 1926 09/27/11 1505  NA 137 138  K 3.9 4.5  CL 102 102  CO2 23 23  GLUCOSE 179* 203*  BUN 50* 49*  CREATININE 5.87*  6.11*  ALB -- --  CALCIUM 8.9 9.0  PHOS -- --   Liver Function Tests:  Lab 09/27/11 1926  AST 21  ALT 10  ALKPHOS 58  BILITOT 0.2*  PROT 6.7  ALBUMIN 3.0*   No results found for this basename: LIPASE:3,AMYLASE:3 in the last 168 hours No results found for this basename: AMMONIA:3 in the last 168 hours CBC:  Lab 09/27/11 1926 09/27/11 1505  WBC 9.2 11.3*  NEUTROABS -- 9.7*  HGB 10.1* 9.6*  HCT  30.6* 29.8*  MCV 81.2 81.2  PLT 206 211   PT/INR: @labrcntip (inr:5) Cardiac Enzymes:  Lab 09/27/11 1510  CKTOTAL --  CKMB --  CKMBINDEX --  TROPONINI <0.30   CBG:  Lab 09/28/11 1200 09/28/11 0747 09/27/11 2121 09/27/11 1854  GLUCAP 111* 164* 176* 186*    Iron Studies: No results found for this basename: IRON:30,TIBC:30,TRANSFERRIN:30,FERRITIN:30 in the last 168 hours  Xrays/Other Studies: Dg Chest Port 1 View  09/28/2011  *RADIOLOGY REPORT*  Clinical Data: Short of breath  PORTABLE CHEST - 1 VIEW  Comparison: Chest radiograph 10/27/2010  Findings: Stable enlarged heart silhouette.  There is increased air space disease in the right upper lobe and right lower lobe as well as the left lung to a lesser degree.  No pneumothorax.  Likely small effusions. Cardiomegaly.  The  IMPRESSION: Increasing bilateral air space disease more dense on the right consistent with worsening pulmonary edema or infection.  Sable cardiomegaly  Original Report Authenticated By: Suzy Bouchard, M.D.   Dg Chest Port 1 View  09/27/2011  *RADIOLOGY REPORT*  Clinical Data: 41 year old female with severe shortness of breath. History of CHF and diabetes.  PORTABLE CHEST - 1 VIEW  Comparison: 03/29/2011.  Findings: AP portable semi upright view 1500 hours. Stable cardiomegaly and mediastinal contours.  Confluent opacity at the right lung base.  Mildly increased retrocardiac opacity also, obscuring the left hemidiaphragm.  There is early airspace disease in the right upper lobe along the minor fissure.  The left  upper lobe is clear.  No pneumothorax or large effusion. Visualized tracheal air column is within normal limits.  IMPRESSION: Bilateral lower lobe and early right upper lobe airspace disease is nonspecific but suspicious for severe pneumonia.  Original Report Authenticated By: Randall An, M.D.    Assessment/Recommendations 1. CKD V presenting with pulm edema, resp distress on BiPap and not responding to IV lasix so far.  Suspect she will need dialysis acutely, possibly tonight. Will have a Foley placed and dose with 160 mg IV lasix. If no response will place temp cath and do HD tonight.  2. HTN- poorly controlled due to vol overload. On multiple BP meds at home. 3. Anemia- Hb 10's, check iron stores, consider ESA. 4. MBD- Ca 9's.  Check phos and PTH, 25OH vit D.  5. DM 2- per primary  Kelly Splinter  MD [facility] [phone] pgr    [phone] cell 09/28/2011, 4:50 PM

## 2011-09-28 NOTE — Procedures (Signed)
Under US guidance and sterile procedure, a right femoral 3-lumen Trialysis 20 cm temporary dialysis catheter was placed without difficulty or complication.  Ready to use.    Kelly Splinter  MD 09/28/2011, 6:49 PM

## 2011-09-28 NOTE — Progress Notes (Signed)
Pt on Bipap unable to take po medications. MD/N for changing any po meds to iv, no new orders

## 2011-09-28 NOTE — Progress Notes (Signed)
Subjective: pateint complaining of some chest soreness. On bipap Objective: Filed Vitals:   09/28/11 0510 09/28/11 0520 09/28/11 0630 09/28/11 0719  BP: 135/70   177/89  Pulse: 72   76  Temp:   98.9 F (37.2 C)   TempSrc:   Rectal   Resp: 18   28  Height:      Weight:      SpO2: 97% 97%  95%   Weight change:   Intake/Output Summary (Last 24 hours) at 09/28/11 0802 Last data filed at 09/27/11 2137  Gross per 24 hour  Intake     50 ml  Output     50 ml  Net      0 ml    General: Alert, awake, oriented x3, in no acute distress.  HEENT: No bruits, no goiter.  Heart: Regular rate and rhythm, without murmurs, rubs, gallops.  Lungs: Good air movement ronchi and wheezing B/L Abdomen: Soft, nontender, nondistended, positive bowel sounds.  Neuro: Grossly intact, nonfocal.   Lab Results:  [facility] 09/27/11 1926 09/27/11 1505  NA 137 138  K 3.9 4.5  CL 102 102  CO2 23 23  GLUCOSE 179* 203*  BUN 50* 49*  CREATININE 5.87* 6.11*  CALCIUM 8.9 9.0  MG -- --  PHOS -- --    Basename 09/27/11 1926  AST 21  ALT 10  ALKPHOS 58  BILITOT 0.2*  PROT 6.7  ALBUMIN 3.0*   No results found for this basename: LIPASE:2,AMYLASE:2 in the last 72 hours  Basename 09/27/11 1926 09/27/11 1505  WBC 9.2 11.3*  NEUTROABS -- 9.7*  HGB 10.1* 9.6*  HCT 30.6* 29.8*  MCV 81.2 81.2  PLT 206 211    Basename 09/27/11 1510  CKTOTAL --  CKMB --  CKMBINDEX --  TROPONINI <0.30   No components found with this basename: POCBNP:3 No results found for this basename: DDIMER:2 in the last 72 hours  Basename 09/27/11 1926  HGBA1C 6.1*   No results found for this basename: CHOL:2,HDL:2,LDLCALC:2,TRIG:2,CHOLHDL:2,LDLDIRECT:2 in the last 72 hours No results found for this basename: TSH,T4TOTAL,FREET3,T3FREE,THYROIDAB in the last 72 hours No results found for this basename: VITAMINB12:2,FOLATE:2,FERRITIN:2,TIBC:2,IRON:2,RETICCTPCT:2 in the last 72 hours  Micro Results: Recent Results (from the  past 240 hour(s))  CULTURE, SPUTUM-ASSESSMENT     Status: Normal   Collection Time   09/27/11  7:51 PM      Component Value Range Status Comment   Specimen Description SPUTUM   Final    Special Requests NONE   Final    Sputum evaluation     Final    Value: MICROSCOPIC FINDINGS SUGGEST THAT THIS SPECIMEN IS NOT REPRESENTATIVE OF LOWER RESPIRATORY SECRETIONS. PLEASE RECOLLECT.     CALLED TO [facility] RN 4.12.13 AT 2230 BY ROMEROJ   Report Status 09/27/2011 FINAL   Final   MRSA PCR SCREENING     Status: Normal   Collection Time   09/27/11 10:14 PM      Component Value Range Status Comment   MRSA by PCR NEGATIVE  NEGATIVE  Final     Studies/Results: Dg Chest Port 1 View  09/27/2011  *RADIOLOGY REPORT*  Clinical Data: 41 year old female with severe shortness of breath. History of CHF and diabetes.  PORTABLE CHEST - 1 VIEW  Comparison: 03/29/2011.  Findings: AP portable semi upright view 1500 hours. Stable cardiomegaly and mediastinal contours.  Confluent opacity at the right lung base.  Mildly increased retrocardiac opacity also, obscuring the left hemidiaphragm.  There is early airspace disease  in the right upper lobe along the minor fissure.  The left upper lobe is clear.  No pneumothorax or large effusion. Visualized tracheal air column is within normal limits.  IMPRESSION: Bilateral lower lobe and early right upper lobe airspace disease is nonspecific but suspicious for severe pneumonia.  Original Report Authenticated By: Randall An, M.D.    Medications: I have reviewed the patient's current medications.  Assessment and plan: Principal Problem:  *Acute respiratory failure with hypoxia: Will go ahead and put her in a non-rebreathe,  Continue albuterol and steroids. Pateint sating 100 on bipap at 60% FiO2. -will go ahead and change her non rebreathe.  Active Problems:  DIABETES MELLITUS II, UNCOMPLICATED: -Blood glucose stable on SSI. -steroids   HTN (hypertension), malignant Bp  high,    Chronic kidney disease, stage 4, severely decreased GFR creatinine at baseline.   PNA (pneumonia)  mild temp at night, now has remained a febrile. Continue rocephin and azithromycin D2.   LOS: 1 day   Charlynne Cousins M.D. Pager: [phone] [facility] 09/28/2011, 8:02 AM

## 2011-09-29 ENCOUNTER — Inpatient Hospital Stay (HOSPITAL_COMMUNITY): Payer: Medicaid Other

## 2011-09-29 DIAGNOSIS — R0602 Shortness of breath: Secondary | ICD-10-CM

## 2011-09-29 DIAGNOSIS — N185 Chronic kidney disease, stage 5: Secondary | ICD-10-CM

## 2011-09-29 LAB — LEGIONELLA ANTIGEN, URINE: Legionella Antigen, Urine: NEGATIVE

## 2011-09-29 LAB — GLUCOSE, CAPILLARY
Glucose-Capillary: 121 mg/dL — ABNORMAL HIGH (ref 70–99)
Glucose-Capillary: 115 mg/dL — ABNORMAL HIGH (ref 70–99)
Glucose-Capillary: 181 mg/dL — ABNORMAL HIGH (ref 70–99)
Glucose-Capillary: 292 mg/dL — ABNORMAL HIGH (ref 70–99)

## 2011-09-29 LAB — BASIC METABOLIC PANEL
BUN: 43 mg/dL — ABNORMAL HIGH (ref 6–23)
CO2: 22 mEq/L (ref 19–32)
Calcium: 8.6 mg/dL (ref 8.4–10.5)
Chloride: 103 mEq/L (ref 96–112)
Creatinine, Ser: 5.19 mg/dL — ABNORMAL HIGH (ref 0.50–1.10)
GFR calc Af Amer: 11 mL/min — ABNORMAL LOW (ref >90)
GFR calc non Af Amer: 9 mL/min — ABNORMAL LOW (ref >90)
Glucose, Bld: 109 mg/dL — ABNORMAL HIGH (ref 70–99)
Potassium: 3.8 mEq/L (ref 3.5–5.1)
Sodium: 142 mEq/L (ref 135–145)

## 2011-09-29 LAB — IRON AND TIBC
Iron: <10 ug/dL — ABNORMAL LOW (ref 42–135)
UIBC: 168 ug/dL (ref 125–400)

## 2011-09-29 LAB — HEPATITIS B CORE ANTIBODY, IGM: Hep B C IgM: NEGATIVE

## 2011-09-29 LAB — HEPATITIS B SURFACE ANTIBODY,QUALITATIVE
Hep B S Ab: NEGATIVE
Hep B S Ab: NEGATIVE

## 2011-09-29 LAB — FERRITIN: Ferritin: 304 ng/mL — ABNORMAL HIGH (ref 10–291)

## 2011-09-29 LAB — HEPATITIS B SURFACE ANTIGEN
Hepatitis B Surface Ag: NEGATIVE
Hepatitis B Surface Ag: NEGATIVE

## 2011-09-29 MED ORDER — HEPARIN SODIUM (PORCINE) 1000 UNIT/ML DIALYSIS
2000.0000 [IU] | INTRAMUSCULAR | Status: DC | PRN
  Administered 2011-09-30: 2000 [IU] via INTRAVENOUS_CENTRAL
  Filled 2011-09-29: qty 2

## 2011-09-29 MED ORDER — NEBIVOLOL HCL 20 MG PO TABS: 20.0000 mg | ORAL_TABLET | Freq: Every day | ORAL | Status: DC

## 2011-09-29 MED ORDER — NEBIVOLOL HCL 10 MG PO TABS
20.0000 mg | ORAL_TABLET | Freq: Every day | ORAL | Status: DC
  Administered 2011-09-29 – 2011-10-04 (×6): 20 mg via ORAL
  Filled 2011-09-29 (×6): qty 2

## 2011-09-29 MED ORDER — SODIUM CHLORIDE 0.9 % IV SOLN
125.0000 mg | Freq: Every day | INTRAVENOUS | Status: DC
  Administered 2011-09-29 – 2011-10-04 (×6): 125 mg via INTRAVENOUS
  Filled 2011-09-29 (×11): qty 10

## 2011-09-29 MED ORDER — SODIUM CHLORIDE 0.9 % IV SOLN
25.0000 mg | Freq: Once | INTRAVENOUS | Status: DC
Start: 2011-09-29 — End: 2011-09-29
  Filled 2011-09-29: qty 2

## 2011-09-29 MED ORDER — HYDRALAZINE HCL 25 MG PO TABS
25.0000 mg | ORAL_TABLET | Freq: Four times a day (QID) | ORAL | Status: DC
  Administered 2011-09-29 (×2): 25 mg via ORAL
  Filled 2011-09-29 (×6): qty 1

## 2011-09-29 MED ORDER — HEPARIN SODIUM (PORCINE) 1000 UNIT/ML DIALYSIS
2000.0000 [IU] | INTRAMUSCULAR | Status: DC | PRN
  Administered 2011-09-29: 2000 [IU] via INTRAVENOUS_CENTRAL
  Filled 2011-09-29: qty 2

## 2011-09-29 MED ORDER — METHYLPREDNISOLONE SODIUM SUCC 40 MG IJ SOLR
40.0000 mg | Freq: Two times a day (BID) | INTRAMUSCULAR | Status: DC
  Administered 2011-09-29 (×2): 40 mg via INTRAVENOUS
  Filled 2011-09-29 (×4): qty 1

## 2011-09-29 MED ORDER — CLONIDINE HCL 0.1 MG PO TABS
0.1000 mg | ORAL_TABLET | Freq: Two times a day (BID) | ORAL | Status: DC
  Administered 2011-09-29: 0.1 mg via ORAL
  Filled 2011-09-29 (×3): qty 1

## 2011-09-29 MED ORDER — INSULIN ASPART 100 UNIT/ML ~~LOC~~ SOLN
0.0000 [IU] | Freq: Three times a day (TID) | SUBCUTANEOUS | Status: DC
  Administered 2011-09-29 – 2011-09-30 (×2): 2 [IU] via SUBCUTANEOUS
  Administered 2011-09-30: 3 [IU] via SUBCUTANEOUS
  Administered 2011-10-02: 1 [IU] via SUBCUTANEOUS
  Administered 2011-10-02: 13:00:00 via SUBCUTANEOUS
  Administered 2011-10-03 – 2011-10-04 (×2): 1 [IU] via SUBCUTANEOUS

## 2011-09-29 MED ORDER — INSULIN GLARGINE 100 UNIT/ML ~~LOC~~ SOLN
5.0000 [IU] | Freq: Every day | SUBCUTANEOUS | Status: DC
  Administered 2011-09-29: 5 [IU] via SUBCUTANEOUS

## 2011-09-29 NOTE — Progress Notes (Signed)
Subjective:  CXR better, but still R>L air space disease. Breathing better, on FM 02 today, just had bipap taken off.   Objective:    Vital signs in last 24 hours: Filed Vitals:   09/29/11 1130 09/29/11 1200 09/29/11 1230 09/29/11 1246  BP: 170/87 172/88 165/82 139/77  Pulse: 99 98 96 96  Temp:    97.8 F (36.6 C)  TempSrc:    Oral  Resp: 27 26 27 23   Height:      Weight:    64.3 kg (141 lb 12.1 oz)  SpO2: 93% 97% 97% 96%   Weight change: -0.561 kg (-1 lb 3.8 oz)  Intake/Output Summary (Last 24 hours) at 09/29/11 1410 Last data filed at 09/29/11 1246  Gross per 24 hour  Intake      0 ml  Output   5981 ml  Net  -5981 ml   Labs: Basic Metabolic Panel:  Lab [id number] 09/28/11 1756 09/27/11 1926 09/27/11 1505  NA 142 -- 137 138  K 3.8 -- 3.9 4.5  CL 103 -- 102 102  CO2 22 -- 23 23  GLUCOSE 109* -- 179* 203*  BUN 43* -- 50* 49*  CREATININE 5.19* -- 5.87* 6.11*  ALB -- -- -- --  CALCIUM 8.6 -- 8.9 9.0  PHOS -- 4.8* -- --   Liver Function Tests:  Lab 09/27/11 1926  AST 21  ALT 10  ALKPHOS 58  BILITOT 0.2*  PROT 6.7  ALBUMIN 3.0*   No results found for this basename: LIPASE:3,AMYLASE:3 in the last 168 hours No results found for this basename: AMMONIA:3 in the last 168 hours CBC:  Lab 09/27/11 1926 09/27/11 1505  WBC 9.2 11.3*  NEUTROABS -- 9.7*  HGB 10.1* 9.6*  HCT 30.6* 29.8*  MCV 81.2 81.2  PLT 206 211   Cardiac Enzymes:  Lab 09/27/11 1510  CKTOTAL --  CKMB --  CKMBINDEX --  TROPONINI <0.30   CBG:  Lab 09/29/11 1327 09/29/11 0746 09/28/11 1809 09/28/11 1200 09/28/11 0747  GLUCAP 115* 121* 106* 111* 164*    Iron Studies:  Lab 09/28/11 1756  IRON <10*  TIBC Not calculated due to Iron <10.  TRANSFERRIN --  FERRITIN 304*   Studies/Results: Dg Chest Port 1 View  09/29/2011  *RADIOLOGY REPORT*  Clinical Data: Follow up edema.  PORTABLE CHEST - 1 VIEW  Comparison: 09/28/2011  Findings: Chest radiograph demonstrates confluent airspace disease  in the right lung.  Slightly improved aeration at the right lung base.  Again noted are the patchy airspace densities in the left mid lung.  Trachea is midline.  IMPRESSION: The distribution of the bilateral airspace disease has minimal changed, right side greater than left.  Slightly improved aeration at the right lung base.  Original Report Authenticated By: Markus Daft, M.D.   Dg Chest Port 1 View  09/28/2011  *RADIOLOGY REPORT*  Clinical Data: Short of breath  PORTABLE CHEST - 1 VIEW  Comparison: Chest radiograph 10/27/2010  Findings: Stable enlarged heart silhouette.  There is increased air space disease in the right upper lobe and right lower lobe as well as the left lung to a lesser degree.  No pneumothorax.  Likely small effusions. Cardiomegaly.  The  IMPRESSION: Increasing bilateral air space disease more dense on the right consistent with worsening pulmonary edema or infection.  Sable cardiomegaly  Original Report Authenticated By: Suzy Bouchard, M.D.   Dg Chest Port 1 View  09/27/2011  *RADIOLOGY REPORT*  Clinical Data: 41 year old female with severe  shortness of breath. History of CHF and diabetes.  PORTABLE CHEST - 1 VIEW  Comparison: 03/29/2011.  Findings: AP portable semi upright view 1500 hours. Stable cardiomegaly and mediastinal contours.  Confluent opacity at the right lung base.  Mildly increased retrocardiac opacity also, obscuring the left hemidiaphragm.  There is early airspace disease in the right upper lobe along the minor fissure.  The left upper lobe is clear.  No pneumothorax or large effusion. Visualized tracheal air column is within normal limits.  IMPRESSION: Bilateral lower lobe and early right upper lobe airspace disease is nonspecific but suspicious for severe pneumonia.  Original Report Authenticated By: Randall An, M.D.   Medications:    . DISCONTD: labetalol (NORMODYNE) infusion        . albuterol  2.5 mg Nebulization Q4H  . antiseptic oral rinse  15 mL Mouth  Rinse q12n4p  . aspirin EC  81 mg Oral Daily  . azithromycin  500 mg Oral Q24H  . cefTRIAXone (ROCEPHIN)  IV  1 g Intravenous Q24H  . chlorhexidine  15 mL Mouth Rinse BID  . cloNIDine  0.2 mg Transdermal Weekly  . ferrous sulfate  325 mg Oral TID WC  . furosemide  160 mg Intravenous Once  . heparin  5,000 Units Subcutaneous Q8H  . insulin aspart  0-9 Units Subcutaneous TID WC  . insulin glargine  5 Units Subcutaneous QHS  . LORazepam  0.5 mg Intravenous Once  . LORazepam  0.5 mg Intravenous Once  . methylPREDNISolone (SOLU-MEDROL) injection  40 mg Intravenous Q12H  . multivitamin  1 tablet Oral Daily  . sodium chloride  3 mL Intravenous Q12H  . DISCONTD: amLODipine  10 mg Oral Daily  . DISCONTD: cloNIDine  0.1 mg Oral BID  . DISCONTD: hydrALAZINE  25 mg Oral QID  . DISCONTD: insulin aspart  0-9 Units Subcutaneous TID WC  . DISCONTD: insulin glargine  20 Units Subcutaneous QHS  . DISCONTD: methylPREDNISolone (SOLU-MEDROL) injection  125 mg Intravenous Q12H  . DISCONTD: nebivolol  20 mg Oral Daily  . DISCONTD: torsemide  40 mg Oral Daily    I  have reviewed scheduled and prn medications.  Physical Exam:  Blood pressure 139/77, pulse 96, temperature 97.8 F (36.6 C), temperature source Oral, resp. rate 23, height 5\' 2"  (1.575 m), weight 64.3 kg (141 lb 12.1 oz), last menstrual period 09/16/2011, SpO2 96.00%.  Gen: frail, calm, in biPap, respirations 24/min  Skin: no rash, cyanosis  Neck: + JVD to angle of jaw, bruits or LAN  Chest: bibasilar crackles  Heart: regular, no rub or gallop  Abdomen: soft, nontender, no ascites, no masses or HSM  Ext: 1-2+ pitting edema bilat LE's  Neuro: alert, Ox3, no focal deficit  Heme/Lymph: no bruising or LAN  Assessment/Recommendations  1. CKD V, with florid pulm edema, new ESRD, s/p 1st HD last night. Will dialyze again today and tomorrow. She has a temp femoral HD cath. Will ask [facility]VS to see for tunnelled catheter and she'll need a permanent  access too.  2. Pulm edema, severe- significantly better, needs more fluid off. Short HD today.  3. HTN- poorly controlled due to vol overload. On multiple BP meds at home. 4. Anemia- Hb 10's, Fe < 10, will load with IV iron 125 mg daily up to 1 gm max. No ESA for now. 5. MBD- Ca 9's. Check phos and PTH, 25OH vit D.  6. DM 2- per primary   Kelly Splinter  MD [facility] [phone]  pgr    [phone] cell 09/29/2011, 2:10 PM

## 2011-09-29 NOTE — Consult Note (Signed)
[facility]      Consult Note  Patient name: [name] MRN: [id number] DOB: 15-Jul-1970 Sex: female  Consulting Physician:  Renal  Reason for Consult:  Chief Complaint  Patient presents with  . Shortness of Breath  . Fever    HISTORY OF PRESENT ILLNESS: This is a 41 year old female with past medical history of chronic renal disease stage IV with baseline creatinine of 5.5-6 not analysis, also past medical history of hypertension, diabetes with an unknown hemoglobin A1c that comes in for cough and shortness of breath. She relates this has progressively gotten worse. She relates this started 4 days prior to admission with productive cough and 3 days prior to admission she started developing shortness of breath.  She is left handed and now on dialysis via a femoral catheter.   Past Medical History  Diagnosis Date  . Chronic kidney disease   . Hypertension   . Diabetes mellitus   . Retinopathy   . Metabolic bone disease   . Anemia     History reviewed. No pertinent past surgical history.  History   Social History  . Marital Status: Single    Spouse Name: N/A    Number of Children: N/A  . Years of Education: N/A   Occupational History  . Not on file.   Social History Main Topics  . Smoking status: Never Smoker   . Smokeless tobacco: Not on file  . Alcohol Use: No  . Drug Use: No  . Sexually Active: No   Other Topics Concern  . Not on file   Social History Narrative  . No narrative on file    Family History  Problem Relation Age of Onset  . Malignant hyperthermia Mother   . Malignant hyperthermia Father     Allergies as of 09/27/2011  . (No Known Allergies)    No current facility-administered medications on file prior to encounter.   Current Outpatient Prescriptions on File Prior to Encounter  Medication Sig Dispense Refill  . cloNIDine (CATAPRES) 0.1 MG tablet Take 0.1 mg by mouth.      . ferrous sulfate 325 (65 FE) MG  tablet Take 325 mg by mouth 3 (three) times daily with meals.      . hydrALAZINE (APRESOLINE) 25 MG tablet Take 25 mg by mouth 4 (four) times daily.       . insulin lispro protamine-insulin lispro (HUMALOG 75/25) (75-25) 100 UNIT/ML SUSP Inject 10 Units into the skin 2 (two) times daily with a meal.       . Nebivolol HCl (BYSTOLIC) 20 MG TABS Take 20 mg by mouth daily.      Marland Kitchen torsemide (DEMADEX) 20 MG tablet Take 40 mg by mouth daily.          REVIEW OF SYSTEMS: See H&P ROS, no changes  PHYSICAL EXAMINATION: General: The patient appears their stated age.  Vital signs are BP 187/74  Pulse 118  Temp(Src) 102.6 F (39.2 C) (Oral)  Resp 28  Ht 5\' 2"  (1.575 m)  Wt 141 lb 12.1 oz (64.3 kg)  BMI 25.93 kg/m2  SpO2 100%  LMP 09/16/2011 Pulmonary: Respirations are non-labored, coarse bilaterally HEENT:  No gross abnormalities Abdomen: Soft and non-tender  Musculoskeletal: There are no major deformities.   Neurologic: No focal weakness or paresthesias are detected, Skin: There are no ulcer or rashes noted. Psychiatric: The patient has normal affect. Cardiovascular: There is a regular rate and rhythm without significant murmur appreciated.  Palpable bilateral  brachial pulse.  I can not palpate radial pulses  Diagnostic Studies: Vein mapping pending    Assessment:  ESRD Plan: Patient will need a permcath and fistula vs AVGG based on her vein mapping.  I will also check arterial upper extremity dopplers given the lack of palpable radial pulses.  Hopefully we can get her access procedures done this week.     Eldridge Abrahams, M.D. [facility] [place] Office: [phone] Pager:  [phone]

## 2011-09-29 NOTE — Progress Notes (Addendum)
Subjective: pateint relates her SOB is some what better. On bipap Objective: Filed Vitals:   09/29/11 0335 09/29/11 0600 09/29/11 0628 09/29/11 0700  BP:   206/73 191/70  Pulse:    100  Temp:    98.7 F (37.1 C)  TempSrc:    Axillary  Resp:    21  Height:      Weight:  67.5 kg (148 lb 13 oz)    SpO2: 97%   95%   Weight change: -0.561 kg (-1 lb 3.8 oz)  Intake/Output Summary (Last 24 hours) at 09/29/11 0754 Last data filed at 09/29/11 0300  Gross per 24 hour  Intake    120 ml  Output   3561 ml  Net  -3441 ml    General: Alert, awake, oriented x3, in no acute distress.  HEENT: No bruits, no goiter.  Heart: Regular rate and rhythm, without murmurs, rubs, gallops.  Lungs: Good air movement, minimal wheezing no ronchi no crackles. Abdomen: Soft, nontender, nondistended, positive bowel sounds.  Neuro: Grossly intact, nonfocal.   Lab Results:  Basename 09/29/11 0440 09/28/11 1756 09/27/11 1926 09/27/11 1505  NA 142 -- 137 138  K 3.8 -- 3.9 4.5  CL 103 -- 102 102  CO2 22 -- 23 23  GLUCOSE 109* -- 179* 203*  BUN 43* -- 50* 49*  CREATININE 5.19* -- 5.87* 6.11*  CALCIUM 8.6 -- 8.9 9.0  MG -- -- -- --  PHOS -- 4.8* -- --    Basename 09/27/11 1926  AST 21  ALT 10  ALKPHOS 58  BILITOT 0.2*  PROT 6.7  ALBUMIN 3.0*   No results found for this basename: LIPASE:2,AMYLASE:2 in the last 72 hours  Basename 09/27/11 1926 09/27/11 1505  WBC 9.2 11.3*  NEUTROABS -- 9.7*  HGB 10.1* 9.6*  HCT 30.6* 29.8*  MCV 81.2 81.2  PLT 206 211    Basename 09/27/11 1510  CKTOTAL --  CKMB --  CKMBINDEX --  TROPONINI <0.30   No components found with this basename: POCBNP:3 No results found for this basename: DDIMER:2 in the last 72 hours  Basename 09/27/11 1926  HGBA1C 6.1*   No results found for this basename: CHOL:2,HDL:2,LDLCALC:2,TRIG:2,CHOLHDL:2,LDLDIRECT:2 in the last 72 hours No results found for this basename: TSH,T4TOTAL,FREET3,T3FREE,THYROIDAB in the last 72  hours  Basename 09/28/11 1756  VITAMINB12 --  FOLATE --  FERRITIN 304*  TIBC Not calculated due to Iron <10.  IRON <10*  RETICCTPCT --    Micro Results: Recent Results (from the past 240 hour(s))  CULTURE, SPUTUM-ASSESSMENT     Status: Normal   Collection Time   09/27/11  7:51 PM      Component Value Range Status Comment   Specimen Description SPUTUM   Final    Special Requests NONE   Final    Sputum evaluation     Final    Value: MICROSCOPIC FINDINGS SUGGEST THAT THIS SPECIMEN IS NOT REPRESENTATIVE OF LOWER RESPIRATORY SECRETIONS. PLEASE RECOLLECT.     CALLED TO B. [facility] RN 4.12.13 AT 2230 BY ROMEROJ   Report Status 09/27/2011 FINAL   Final   MRSA PCR SCREENING     Status: Normal   Collection Time   09/27/11 10:14 PM      Component Value Range Status Comment   MRSA by PCR NEGATIVE  NEGATIVE  Final     Studies/Results: Dg Chest Port 1 View  09/28/2011  *RADIOLOGY REPORT*  Clinical Data: Short of breath  PORTABLE CHEST - 1 VIEW  Comparison:  Chest radiograph 10/27/2010  Findings: Stable enlarged heart silhouette.  There is increased air space disease in the right upper lobe and right lower lobe as well as the left lung to a lesser degree.  No pneumothorax.  Likely small effusions. Cardiomegaly.  The  IMPRESSION: Increasing bilateral air space disease more dense on the right consistent with worsening pulmonary edema or infection.  Sable cardiomegaly  Original Report Authenticated By: Suzy Bouchard, M.D.   Dg Chest Port 1 View  09/27/2011  *RADIOLOGY REPORT*  Clinical Data: 40 year old female with severe shortness of breath. History of CHF and diabetes.  PORTABLE CHEST - 1 VIEW  Comparison: 03/29/2011.  Findings: AP portable semi upright view 1500 hours. Stable cardiomegaly and mediastinal contours.  Confluent opacity at the right lung base.  Mildly increased retrocardiac opacity also, obscuring the left hemidiaphragm.  There is early airspace disease in the right upper lobe along the  minor fissure.  The left upper lobe is clear.  No pneumothorax or large effusion. Visualized tracheal air column is within normal limits.  IMPRESSION: Bilateral lower lobe and early right upper lobe airspace disease is nonspecific but suspicious for severe pneumonia.  Original Report Authenticated By: Randall An, M.D.    Medications: I have reviewed the patient's current medications.  Assessment and plan: Principal Problem:  *Acute respiratory failure with hypoxia: Multifactorial PNA and vol overload. Continues to improve. Continue albuterol and steroids. Pateint sating 95% on bipap at 40 FiO2. Respiration rate is down. She relates she feels more comfortable. -try non-rebreathe if she tolerates.  Active Problems:  DIABETES MELLITUS II, UNCOMPLICATED: -Blood glucose stable on SSI. -steroids   HTN (hypertension), malignant Bp high,    Chronic kidney disease, stage 4, severely decreased GFR Consulted renal, patient HD yesterday. 3.0L negative. -probably HD today per renal.   PNA (pneumonia)  mild temp at night, now has remained a febrile. Continue rocephin and azithromycin D3.   LOS: 2 days   Charlynne Cousins M.D. Pager: [phone] [facility] 09/29/2011, 7:54 AM

## 2011-09-29 NOTE — Procedures (Signed)
I was present at this dialysis session. I have reviewed the session itself and made appropriate changes.   Kelly Splinter, MD [employer] 09/29/2011, 2:10 PM

## 2011-09-30 ENCOUNTER — Inpatient Hospital Stay (HOSPITAL_COMMUNITY): Payer: Medicaid Other

## 2011-09-30 LAB — RENAL FUNCTION PANEL
Albumin: 2.6 g/dL — ABNORMAL LOW (ref 3.5–5.2)
BUN: 43 mg/dL — ABNORMAL HIGH (ref 6–23)
CO2: 26 mEq/L (ref 19–32)
Calcium: 8.6 mg/dL (ref 8.4–10.5)
Chloride: 100 mEq/L (ref 96–112)
Creatinine, Ser: 4.62 mg/dL — ABNORMAL HIGH (ref 0.50–1.10)
GFR calc Af Amer: 13 mL/min — ABNORMAL LOW (ref >90)
GFR calc non Af Amer: 11 mL/min — ABNORMAL LOW (ref >90)
Glucose, Bld: 164 mg/dL — ABNORMAL HIGH (ref 70–99)
Phosphorus: 3.9 mg/dL (ref 2.3–4.6)
Potassium: 3.9 mEq/L (ref 3.5–5.1)
Sodium: 138 mEq/L (ref 135–145)

## 2011-09-30 LAB — CBC
HCT: 28 % — ABNORMAL LOW (ref 36.0–46.0)
Hemoglobin: 9.2 g/dL — ABNORMAL LOW (ref 12.0–15.0)
MCH: 26.1 pg (ref 26.0–34.0)
MCHC: 32.9 g/dL (ref 30.0–36.0)
MCV: 79.5 fL (ref 78.0–100.0)
Platelets: 129 10*3/uL — ABNORMAL LOW (ref 150–400)
RBC: 3.52 MIL/uL — ABNORMAL LOW (ref 3.87–5.11)
RDW: 15.3 % (ref 11.5–15.5)
WBC: 9 10*3/uL (ref 4.0–10.5)

## 2011-09-30 LAB — GLUCOSE, CAPILLARY
Glucose-Capillary: 221 mg/dL — ABNORMAL HIGH (ref 70–99)
Glucose-Capillary: 172 mg/dL — ABNORMAL HIGH (ref 70–99)

## 2011-09-30 LAB — PARATHYROID HORMONE, INTACT (NO CA): PTH: 157.5 pg/mL — ABNORMAL HIGH (ref 14.0–72.0)

## 2011-09-30 LAB — VITAMIN D 25 HYDROXY (VIT D DEFICIENCY, FRACTURES): Vit D, 25-Hydroxy: 24 ng/mL — ABNORMAL LOW (ref 30–89)

## 2011-09-30 MED ORDER — ALBUTEROL SULFATE (5 MG/ML) 0.5% IN NEBU
2.5000 mg | INHALATION_SOLUTION | Freq: Four times a day (QID) | RESPIRATORY_TRACT | Status: DC
  Administered 2011-09-30 – 2011-10-02 (×6): 2.5 mg via RESPIRATORY_TRACT
  Filled 2011-09-30 (×6): qty 0.5

## 2011-09-30 MED ORDER — AZITHROMYCIN 500 MG PO TABS
500.0000 mg | ORAL_TABLET | Freq: Every day | ORAL | Status: AC
  Administered 2011-09-30 – 2011-10-04 (×5): 500 mg via ORAL
  Filled 2011-09-30 (×6): qty 1

## 2011-09-30 MED ORDER — ACETAMINOPHEN 325 MG PO TABS: 650.0000 mg | ORAL_TABLET | Freq: Four times a day (QID) | ORAL | Status: DC | PRN

## 2011-09-30 MED ORDER — INSULIN GLARGINE 100 UNIT/ML ~~LOC~~ SOLN
10.0000 [IU] | Freq: Every day | SUBCUTANEOUS | Status: DC
  Administered 2011-09-30 – 2011-10-05 (×5): 10 [IU] via SUBCUTANEOUS

## 2011-09-30 MED ORDER — CEFTRIAXONE SODIUM 1 G IJ SOLR
1.0000 g | INTRAMUSCULAR | Status: DC
  Administered 2011-09-30 – 2011-10-04 (×5): 1 g via INTRAVENOUS
  Filled 2011-09-30 (×5): qty 10

## 2011-09-30 MED ORDER — HYDRALAZINE HCL 50 MG PO TABS
50.0000 mg | ORAL_TABLET | Freq: Four times a day (QID) | ORAL | Status: DC
  Administered 2011-09-30 – 2011-10-05 (×14): 50 mg via ORAL
  Filled 2011-09-30 (×22): qty 1

## 2011-09-30 MED ORDER — CLONIDINE HCL 0.2 MG PO TABS
0.2000 mg | ORAL_TABLET | Freq: Three times a day (TID) | ORAL | Status: DC
  Administered 2011-09-30 – 2011-10-05 (×11): 0.2 mg via ORAL
  Filled 2011-09-30 (×16): qty 1

## 2011-09-30 NOTE — Progress Notes (Signed)
Settings changed per pt toleration. Pt requested to wear unit, not exhibiting any sob or increased wob. o2 titratred for moderate o2sats.

## 2011-09-30 NOTE — Progress Notes (Signed)
Utilization review completed.  

## 2011-09-30 NOTE — Procedures (Signed)
Patient was seen on dialysis and the procedure was supervised. BFR 300 Via right femoral temp cath BP is 214/105.  Patient appears to be tolerating treatment well

## 2011-09-30 NOTE — Progress Notes (Addendum)
[facility] [place] TEAM 1 - Stepdown/ICU TEAM  Subjective: 41 year old female w/ history of chronic renal disease stage IV with baseline creatinine of 5.5-6 not yet on dialysis, hypertension, & diabetes that comes in for cough and shortness of breath. She relates this has progressively gotten worse. She relates this started 4 days prior to admission with productive cough and 3 days prior to admission she started developing shortness of breath. In the emergency room she was placed on CPAP and was saturating in the 80s.  The pt is resting comfortably in her room post HD.  She reports that her SOB has improved significantly.  She is now on a venturi mask.  She denies f/c, sob, n/v, abdom pain, or HA.    Objective: Weight change: -3.5 kg (-7 lb 11.5 oz)  Intake/Output Summary (Last 24 hours) at 09/30/11 1206 Last data filed at 09/30/11 0048  Gross per 24 hour  Intake   1280 ml  Output   3220 ml  Net  -1940 ml   Blood pressure 165/98, pulse 79, temperature 98.1 F (36.7 C), temperature source Axillary, resp. rate 15, height 5\' 2"  (1.575 m), weight 64.3 kg (141 lb 12.1 oz), last menstrual period 09/16/2011, SpO2 98.00%.  CBG (last 3)   Basename 09/30/11 0718 09/29/11 2145 09/29/11 1750  GLUCAP 221* 292* 181*   Physical Exam: General: No acute respiratory distress at present Lungs: mild bibasilar crackles - no wheeze - good air movement th/o otherwise Cardiovascular: Regular rate and rhythm without murmur gallop or rub Abdomen: Nontender, nondistended, soft, bowel sounds positive, no rebound, no ascites, no appreciable mass Extremities: No significant cyanosis or clubbing, only trace edema bilateral lower extremities  Lab Results:  Basename 09/29/11 0440 09/28/11 1756 09/27/11 1926 09/27/11 1505  NA 142 -- 137 138  K 3.8 -- 3.9 4.5  CL 103 -- 102 102  CO2 22 -- 23 23  GLUCOSE 109* -- 179* 203*  BUN 43* -- 50* 49*  CREATININE 5.19* -- 5.87* 6.11*  CALCIUM 8.6 -- 8.9 9.0    MG -- -- -- --  PHOS -- 4.8* -- --    Basename 09/27/11 1926  AST 21  ALT 10  ALKPHOS 58  BILITOT 0.2*  PROT 6.7  ALBUMIN 3.0*    Basename 09/27/11 1926 09/27/11 1505  WBC 9.2 11.3*  NEUTROABS -- 9.7*  HGB 10.1* 9.6*  HCT 30.6* 29.8*  MCV 81.2 81.2  PLT 206 211    Basename 09/27/11 1510  CKTOTAL --  CKMB --  CKMBINDEX --  TROPONINI <0.30    Basename 09/27/11 1926  HGBA1C 6.1*    Micro Results: Recent Results (from the past 240 hour(s))  CULTURE, BLOOD (ROUTINE X 2)     Status: Normal (Preliminary result)   Collection Time   09/27/11  4:17 PM      Component Value Range Status Comment   Specimen Description BLOOD ARM RIGHT   Final    Special Requests BOTTLES DRAWN AEROBIC AND ANAEROBIC 10CC EACH   Final    Culture  Setup Time [id number]   Final    Culture     Final    Value:        BLOOD CULTURE RECEIVED NO GROWTH TO DATE CULTURE WILL BE HELD FOR 5 DAYS BEFORE ISSUING A FINAL NEGATIVE REPORT   Report Status PENDING   Incomplete   CULTURE, BLOOD (ROUTINE X 2)     Status: Normal (Preliminary result)   Collection Time   09/27/11  4:20 PM      Component Value Range Status Comment   Specimen Description BLOOD HAND LEFT   Final    Special Requests BOTTLES DRAWN AEROBIC AND ANAEROBIC [facility] EACH   Final    Culture  Setup Time [id number]   Final    Culture     Final    Value:        BLOOD CULTURE RECEIVED NO GROWTH TO DATE CULTURE WILL BE HELD FOR 5 DAYS BEFORE ISSUING A FINAL NEGATIVE REPORT   Report Status PENDING   Incomplete   CULTURE, SPUTUM-ASSESSMENT     Status: Normal   Collection Time   09/27/11  7:51 PM      Component Value Range Status Comment   Specimen Description SPUTUM   Final    Special Requests NONE   Final    Sputum evaluation     Final    Value: MICROSCOPIC FINDINGS SUGGEST THAT THIS SPECIMEN IS NOT REPRESENTATIVE OF LOWER RESPIRATORY SECRETIONS. PLEASE RECOLLECT.     CALLED TO B. [facility] RN 4.12.13 AT 2230 BY ROMEROJ   Report Status 09/27/2011  FINAL   Final   MRSA PCR SCREENING     Status: Normal   Collection Time   09/27/11 10:14 PM      Component Value Range Status Comment   MRSA by PCR NEGATIVE  NEGATIVE  Final     Studies/Results: All recent x-ray/radiology reports have been reviewed in detail.   Medications: I have reviewed the patient's complete medication list.  Assessment/Plan:  Acute hypoxic respiratory failure Largely due to ESRD related volume overload/pulmonary edema - steadily improving with each HD tx - HD to continue - weaning O2 as able    DM2 Erratic control - adjust meds and follow   HTN (hypertension), malignant  Uncontrolled due to severe volume overload - ongoing HD should eventually resolve this issue - adjust meds for now and follow  Chronic kidney disease, stage 4, severely decreased GFR  Nephrology has been consulted - was scheduled to have access placed this week - acute HD has been initiated due to refractory volume overload/pulmonary edema w/ hypoxia - VVS is following, with plans to place a permcath and fistula vs AVGG this admit  PNA (pneumonia)? Though I suspect the pt's primary resp issue has been pulmonary edema, she has in fact suffered with a fever on a number of occasions during her hospital stay - as a result, I feel it is reasonable to complete a short course of coverage for a CAP (5 days azithro + 7 days rocephine)  Anemia Severe Fe deficiency - tx as per usual HD protocol   Dispo Hopefully will be stable for transfer to [facility]00 in next 24hrs  Cherene Altes, MD [facility] Office  [phone] Pager [phone]  On-Call/Text Page:      [email or web address]      password [facility]

## 2011-09-30 NOTE — Progress Notes (Signed)
Patient ID: Ammi Pavlik, female   DOB: [birth year], 41 y.o.   MRN: [id number] Pt seen while on HD, no new complaints.  AVSS Lungs- CTA CVS- rrr Abd- benign Ext- trace edema  A/P 1. New ESRD- cont with HD and send info to [facility] office for outpt HD 2. vasc access- appreciate VVS assistance.  For vein mapping, PC, and AVG/AVF 3. Anemia of chronic disease- on EPO/iron 4. HTN- cont with UF and meds 5. Disp-needs outpt HD

## 2011-10-01 ENCOUNTER — Encounter: Payer: Self-pay | Admitting: Vascular Surgery

## 2011-10-01 DIAGNOSIS — Z992 Dependence on renal dialysis: Secondary | ICD-10-CM

## 2011-10-01 LAB — GLUCOSE, CAPILLARY
Glucose-Capillary: 167 mg/dL — ABNORMAL HIGH (ref 70–99)
Glucose-Capillary: 112 mg/dL — ABNORMAL HIGH (ref 70–99)
Glucose-Capillary: 99 mg/dL (ref 70–99)
Glucose-Capillary: 113 mg/dL — ABNORMAL HIGH (ref 70–99)
Glucose-Capillary: 172 mg/dL — ABNORMAL HIGH (ref 70–99)

## 2011-10-01 LAB — CLOSTRIDIUM DIFFICILE BY PCR: Toxigenic C. Difficile by PCR: NEGATIVE

## 2011-10-01 MED ORDER — LORAZEPAM 0.5 MG PO TABS
0.5000 mg | ORAL_TABLET | Freq: Once | ORAL | Status: AC
  Administered 2011-10-01: 0.5 mg via ORAL
  Filled 2011-10-01: qty 1

## 2011-10-01 NOTE — Progress Notes (Signed)
Patient ID: [name], female   DOB: [birth year], 41 y.o.   MRN: [id number] S:feels better O:BP 146/71  Pulse 65  Temp(Src) 97.7 F (36.5 C) (Oral)  Resp 13  Ht 5\' 2"  (1.575 m)  Wt 59 kg (130 lb 1.1 oz)  BMI 23.79 kg/m2  SpO2 100%  LMP 09/16/2011  Intake/Output Summary (Last 24 hours) at 10/01/11 1303 Last data filed at 10/01/11 1200  Gross per 24 hour  Intake    600 ml  Output   3240 ml  Net  -2640 ml   Weight change: -4.5 kg (-9 lb 14.7 oz) Gen:WD WN AAF in NAD CVS:no rub Resp:CTA [id number] Ext:no edema   Lab 09/30/11 1230 09/29/11 0440 09/28/11 1756 09/27/11 1926 09/27/11 1505  NA 138 142 -- 137 138  K 3.9 3.8 -- 3.9 4.5  CL 100 103 -- 102 102  CO2 26 22 -- 23 23  GLUCOSE 164* 109* -- 179* 203*  BUN 43* 43* -- 50* 49*  CREATININE 4.62* 5.19* -- 5.87* 6.11*  ALB -- -- -- -- --  CALCIUM 8.6 8.6 -- 8.9 9.0  PHOS 3.9 -- 4.8* -- --  AST -- -- -- 21 --  ALT -- -- -- 10 --   Liver Function Tests:  Lab 09/30/11 1230 09/27/11 1926  AST -- 21  ALT -- 10  ALKPHOS -- 58  BILITOT -- 0.2*  PROT -- 6.7  ALBUMIN 2.6* 3.0*   No results found for this basename: LIPASE:3,AMYLASE:3 in the last 168 hours No results found for this basename: AMMONIA:3 in the last 168 hours CBC:  Lab 09/30/11 1230 09/27/11 1926 09/27/11 1505  WBC 9.0 9.2 11.3*  NEUTROABS -- -- 9.7*  HGB 9.2* 10.1* 9.6*  HCT 28.0* 30.6* 29.8*  MCV 79.5 81.2 81.2  PLT 129* 206 211   Cardiac Enzymes:  Lab 09/27/11 1510  CKTOTAL --  CKMB --  CKMBINDEX --  TROPONINI <0.30   CBG:  Lab 10/01/11 1215 10/01/11 0744 09/30/11 2112 09/30/11 1732 09/30/11 0718  GLUCAP 99 112* 167* 172* 221*    Iron Studies:  Basename 09/28/11 1756  IRON <10*  TIBC Not calculated due to Iron <10.  TRANSFERRIN --  FERRITIN 304*   Studies/Results: No results found.    Marland Kitchen albuterol  2.5 mg Nebulization QID  . antiseptic oral rinse  15 mL Mouth Rinse q12n4p  . aspirin EC  81 mg Oral Daily  . azithromycin  500 mg  Oral Daily  . cefTRIAXone (ROCEPHIN)  IV  1 g Intravenous Q24H  . chlorhexidine  15 mL Mouth Rinse BID  . cloNIDine  0.2 mg Oral TID  . ferric gluconate (FERRLECIT/NULECIT) IV  125 mg Intravenous Daily  . ferrous sulfate  325 mg Oral TID WC  . heparin  5,000 Units Subcutaneous Q8H  . hydrALAZINE  50 mg Oral QID  . insulin aspart  0-9 Units Subcutaneous TID WC  . insulin glargine  10 Units Subcutaneous QHS  . multivitamin  1 tablet Oral Daily  . nebivolol  20 mg Oral Daily  . DISCONTD: albuterol  2.5 mg Nebulization Q4H  . DISCONTD: azithromycin  500 mg Oral Q24H  . DISCONTD: cefTRIAXone (ROCEPHIN)  IV  1 g Intravenous Q24H  . DISCONTD: cloNIDine  0.2 mg Transdermal Weekly  . DISCONTD: cloNIDine  0.1 mg Oral BID  . DISCONTD: hydrALAZINE  25 mg Oral QID  . DISCONTD: insulin glargine  5 Units Subcutaneous QHS  . DISCONTD: methylPREDNISolone (SOLU-MEDROL) injection  40  mg Intravenous Q12H    BMET    Component Value Date/Time   NA 138 09/30/2011 1230   K 3.9 09/30/2011 1230   CL 100 09/30/2011 1230   CO2 26 09/30/2011 1230   GLUCOSE 164* 09/30/2011 1230   GLUCOSE 318 03/31/2009 0815   BUN 43* 09/30/2011 1230   CREATININE 4.62* 09/30/2011 1230   CALCIUM 8.6 09/30/2011 1230   CALCIUM 8.5 04/02/2011 0514   GFRNONAA 11* 09/30/2011 1230   GFRAA 13* 09/30/2011 1230   CBC    Component Value Date/Time   WBC 9.0 09/30/2011 1230   RBC 3.52* 09/30/2011 1230   HGB 9.2* 09/30/2011 1230   HCT 28.0* 09/30/2011 1230   PLT 129* 09/30/2011 1230   MCV 79.5 09/30/2011 1230   MCH 26.1 09/30/2011 1230   MCHC 32.9 09/30/2011 1230   RDW 15.3 09/30/2011 1230   LYMPHSABS 1.1 09/27/2011 1505   MONOABS 0.5 09/27/2011 1505   EOSABS 0.0 09/27/2011 1505   BASOSABS 0.0 09/27/2011 1505     Assessment/Plan: 1. New ESRD- cont with HD and for [facility]pt HD [facility] 2. vasc access- appreciate VVS assistance. For vein mapping, PC, and AVG/AVF.  Will pull femoral cath once tunneled catheter placed 3. Anemia of chronic  disease- on EPO/iron 4. HTN- cont with UF and meds 5. Disp-needs permanent access prior to [place]

## 2011-10-01 NOTE — Progress Notes (Signed)
Awaiting vein map and arterial duplex of upper extremity.  Will plan permanent access and catheter once completed.  Annamarie Major

## 2011-10-01 NOTE — Progress Notes (Signed)
10/01/2011 0200  Pt urine output 35 cc and having multiple loose stools. New orders received. Pt placed on enteric precautions to r/o C. Diff. Will continue to monitor.  [place] Rejeana Fadness, RN

## 2011-10-01 NOTE — Progress Notes (Signed)
[facility] [place] TEAM 1 - Stepdown/ICU TEAM  Subjective: 41 year old female w/ history of chronic renal disease stage IV with baseline creatinine of 5.5-6 not yet on dialysis, hypertension, & diabetes that comes in for cough and shortness of breath. She relates this has progressively gotten worse. She relates this started 4 days prior to admission with productive cough and 3 days prior to admission she started developing shortness of breath. In the emergency room she was placed on CPAP and was saturating in the 80s.  Both cough and dyspnea have improved. She c/o "diarrhea" and describes it to be multiple stools, mostly in the evening and at night. No abdominal pain or nausea.   Objective: Weight change: -4.5 kg (-9 lb 14.7 oz)  Intake/Output Summary (Last 24 hours) at 10/01/11 1807 Last data filed at 10/01/11 1600  Gross per 24 hour  Intake    660 ml  Output    290 ml  Net    370 ml   Blood pressure 148/70, pulse 65, temperature 98.2 F (36.8 C), temperature source Oral, resp. rate 13, height 5\' 2"  (1.575 m), weight 59 kg (130 lb 1.1 oz), last menstrual period 09/16/2011, SpO2 100.00%.  CBG (last 3)   Basename 10/01/11 1215 10/01/11 0744 09/30/11 2112  GLUCAP 99 112* 167*   Physical Exam: General: No acute respiratory distress at present Lungs: bibasilar crackles - no wheeze - good air movement th/o otherwise Cardiovascular: Regular rate and rhythm without murmur gallop or rub Abdomen: mildly tender in RLL, nondistended, soft, bowel sounds positive, no rebound, no ascites, no appreciable mass Extremities: No significant cyanosis or clubbing, only trace edema bilateral lower extremities  Lab Results:  [facility] 09/30/11 1230 09/29/11 0440  NA 138 142  K 3.9 3.8  CL 100 103  CO2 26 22  GLUCOSE 164* 109*  BUN 43* 43*  CREATININE 4.62* 5.19*  CALCIUM 8.6 8.6  MG -- --  PHOS 3.9 --    Basename 09/30/11 1230  AST --  ALT --  ALKPHOS --  BILITOT --  PROT --    ALBUMIN 2.6*    Basename 09/30/11 1230  WBC 9.0  NEUTROABS --  HGB 9.2*  HCT 28.0*  MCV 79.5  PLT 129*   No results found for this basename: CKTOTAL:3,CKMB:3,CKMBINDEX:3,TROPONINI:3 in the last 72 hours No results found for this basename: HGBA1C:12 in the last 72 hours  Micro Results: Recent Results (from the past 240 hour(s))  CULTURE, BLOOD (ROUTINE X 2)     Status: Normal (Preliminary result)   Collection Time   09/27/11  4:17 PM      Component Value Range Status Comment   Specimen Description BLOOD ARM RIGHT   Final    Special Requests BOTTLES DRAWN AEROBIC AND ANAEROBIC 10CC EACH   Final    Culture  Setup Time [id number]   Final    Culture     Final    Value:        BLOOD CULTURE RECEIVED NO GROWTH TO DATE CULTURE WILL BE HELD FOR 5 DAYS BEFORE ISSUING A FINAL NEGATIVE REPORT   Report Status PENDING   Incomplete   CULTURE, BLOOD (ROUTINE X 2)     Status: Normal (Preliminary result)   Collection Time   09/27/11  4:20 PM      Component Value Range Status Comment   Specimen Description BLOOD HAND LEFT   Final    Special Requests BOTTLES DRAWN AEROBIC AND ANAEROBIC [facility] EACH   Final  Culture  Setup Time [id number]   Final    Culture     Final    Value:        BLOOD CULTURE RECEIVED NO GROWTH TO DATE CULTURE WILL BE HELD FOR 5 DAYS BEFORE ISSUING A FINAL NEGATIVE REPORT   Report Status PENDING   Incomplete   CULTURE, SPUTUM-ASSESSMENT     Status: Normal   Collection Time   09/27/11  7:51 PM      Component Value Range Status Comment   Specimen Description SPUTUM   Final    Special Requests NONE   Final    Sputum evaluation     Final    Value: MICROSCOPIC FINDINGS SUGGEST THAT THIS SPECIMEN IS NOT REPRESENTATIVE OF LOWER RESPIRATORY SECRETIONS. PLEASE RECOLLECT.     CALLED TO [facility] RN 4.12.13 AT 2230 BY ROMEROJ   Report Status 09/27/2011 FINAL   Final   MRSA PCR SCREENING     Status: Normal   Collection Time   09/27/11 10:14 PM      Component Value Range Status  Comment   MRSA by PCR NEGATIVE  NEGATIVE  Final   CLOSTRIDIUM DIFFICILE BY PCR     Status: Normal   Collection Time   10/01/11  2:17 AM      Component Value Range Status Comment   C difficile by pcr NEGATIVE  NEGATIVE  Final     Studies/Results: All recent x-ray/radiology reports have been reviewed in detail.   Medications: I have reviewed the patient's complete medication list.  Assessment/Plan:  Acute hypoxic respiratory failure Largely due to ESRD related volume overload/pulmonary edema - steadily improving with each HD tx - HD to continue - weaning O2 as able    DM2 Sugars stable today   HTN (hypertension), malignant  Uncontrolled due to severe volume overload - ongoing HD should eventually resolve this issue - no change in medical treatment for now.   Chronic kidney disease, stage 4, severely decreased GFR  VVS is following-  fistula vs AVG this admit Pt has been scheduled for HD on t/th/sat at [facility].   PNA (pneumonia)? Cont coverage for CAP (5 days azithro + 7 days rocephin)  Diarrhea C. Diff negative. Check stool for lactoferrin. If negative, can give Lomotil PRN  Anemia Severe Fe deficiency - tx as per usual HD protocol     Debbe Odea, MD [facility] Office  [phone] Pager [phone]  On-Call/Text Page:      [email or web address]      password [facility]

## 2011-10-02 ENCOUNTER — Ambulatory Visit: Payer: Self-pay | Admitting: Vascular Surgery

## 2011-10-02 LAB — GLUCOSE, CAPILLARY
Glucose-Capillary: 103 mg/dL — ABNORMAL HIGH (ref 70–99)
Glucose-Capillary: 144 mg/dL — ABNORMAL HIGH (ref 70–99)
Glucose-Capillary: 137 mg/dL — ABNORMAL HIGH (ref 70–99)
Glucose-Capillary: 147 mg/dL — ABNORMAL HIGH (ref 70–99)

## 2011-10-02 LAB — RENAL FUNCTION PANEL
Albumin: 2.2 g/dL — ABNORMAL LOW (ref 3.5–5.2)
BUN: 50 mg/dL — ABNORMAL HIGH (ref 6–23)
CO2: 24 mEq/L (ref 19–32)
Calcium: 8.5 mg/dL (ref 8.4–10.5)
Chloride: 99 mEq/L (ref 96–112)
Creatinine, Ser: 4.98 mg/dL — ABNORMAL HIGH (ref 0.50–1.10)
GFR calc Af Amer: 11 mL/min — ABNORMAL LOW (ref >90)
GFR calc non Af Amer: 10 mL/min — ABNORMAL LOW (ref >90)
Glucose, Bld: 103 mg/dL — ABNORMAL HIGH (ref 70–99)
Phosphorus: 4.2 mg/dL (ref 2.3–4.6)
Potassium: 3.6 mEq/L (ref 3.5–5.1)
Sodium: 136 mEq/L (ref 135–145)

## 2011-10-02 LAB — CBC
HCT: 29.4 % — ABNORMAL LOW (ref 36.0–46.0)
Hemoglobin: 9.7 g/dL — ABNORMAL LOW (ref 12.0–15.0)
MCH: 26.1 pg (ref 26.0–34.0)
MCHC: 33 g/dL (ref 30.0–36.0)
MCV: 79 fL (ref 78.0–100.0)
Platelets: 153 10*3/uL (ref 150–400)
RBC: 3.72 MIL/uL — ABNORMAL LOW (ref 3.87–5.11)
RDW: 14.8 % (ref 11.5–15.5)
WBC: 11.2 10*3/uL — ABNORMAL HIGH (ref 4.0–10.5)

## 2011-10-02 MED ORDER — HEPARIN SODIUM (PORCINE) 1000 UNIT/ML DIALYSIS
20.0000 [IU]/kg | INTRAMUSCULAR | Status: DC | PRN
  Administered 2011-10-03: 1200 [IU] via INTRAVENOUS_CENTRAL
  Filled 2011-10-02: qty 2

## 2011-10-02 MED ORDER — LOPERAMIDE HCL 2 MG PO CAPS
2.0000 mg | ORAL_CAPSULE | ORAL | Status: DC | PRN
  Filled 2011-10-02: qty 1

## 2011-10-02 MED ORDER — ALBUTEROL SULFATE (5 MG/ML) 0.5% IN NEBU
2.5000 mg | INHALATION_SOLUTION | Freq: Two times a day (BID) | RESPIRATORY_TRACT | Status: DC
  Administered 2011-10-02 – 2011-10-03 (×3): 2.5 mg via RESPIRATORY_TRACT
  Filled 2011-10-02 (×4): qty 0.5

## 2011-10-02 NOTE — Progress Notes (Addendum)
Patient ID: [name], female   DOB: Nov 01, 1970, 41 y.o.   MRN: [id number] S:feels well O:BP 165/72  Pulse 78  Temp(Src) 97.8 F (36.6 C) (Oral)  Resp 13  Ht 5\' 2"  (1.575 m)  Wt 59 kg (130 lb 1.1 oz)  BMI 23.79 kg/m2  SpO2 100%  LMP 09/16/2011  Intake/Output Summary (Last 24 hours) at 10/02/11 1016 Last data filed at 10/02/11 0800  Gross per 24 hour  Intake    360 ml  Output    380 ml  Net    -20 ml   Weight change:  Gen:WD WN AAF in NAD CVS:RRR Resp:CTA  [id number] Ext:no edema   Lab 10/02/11 0530 09/30/11 1230 09/29/11 0440 09/28/11 1756 09/27/11 1926 09/27/11 1505  NA 136 138 142 -- 137 138  K 3.6 3.9 3.8 -- 3.9 4.5  CL 99 100 103 -- 102 102  CO2 24 26 22  -- 23 23  GLUCOSE 103* 164* 109* -- 179* 203*  BUN 50* 43* 43* -- 50* 49*  CREATININE 4.98* 4.62* 5.19* -- 5.87* 6.11*  ALB -- -- -- -- -- --  CALCIUM 8.5 8.6 8.6 -- 8.9 9.0  PHOS 4.2 3.9 -- 4.8* -- --  AST -- -- -- -- 21 --  ALT -- -- -- -- 10 --   Liver Function Tests:  Lab 10/02/11 0530 09/30/11 1230 09/27/11 1926  AST -- -- 21  ALT -- -- 10  ALKPHOS -- -- 58  BILITOT -- -- 0.2*  PROT -- -- 6.7  ALBUMIN 2.2* 2.6* 3.0*   No results found for this basename: LIPASE:3,AMYLASE:3 in the last 168 hours No results found for this basename: AMMONIA:3 in the last 168 hours CBC:  Lab 10/02/11 0530 09/30/11 1230 09/27/11 1926 09/27/11 1505  WBC 11.2* 9.0 9.2 --  NEUTROABS -- -- -- 9.7*  HGB 9.7* 9.2* 10.1* --  HCT 29.4* 28.0* 30.6* --  MCV 79.0 79.5 81.2 81.2  PLT 153 129* 206 --   Cardiac Enzymes:  Lab 09/27/11 1510  CKTOTAL --  CKMB --  CKMBINDEX --  TROPONINI <0.30   CBG:  Lab 10/02/11 0727 10/01/11 2120 10/01/11 1753 10/01/11 1215 10/01/11 0744  GLUCAP 103* 172* 113* 99 112*    Iron Studies: No results found for this basename: IRON,TIBC,TRANSFERRIN,FERRITIN in the last 72 hours Studies/Results: No results found.    Marland Kitchen albuterol  2.5 mg Nebulization BID  . antiseptic oral rinse  15 mL  Mouth Rinse q12n4p  . aspirin EC  81 mg Oral Daily  . azithromycin  500 mg Oral Daily  . cefTRIAXone (ROCEPHIN)  IV  1 g Intravenous Q24H  . chlorhexidine  15 mL Mouth Rinse BID  . cloNIDine  0.2 mg Oral TID  . ferric gluconate (FERRLECIT/NULECIT) IV  125 mg Intravenous Daily  . ferrous sulfate  325 mg Oral TID WC  . heparin  5,000 Units Subcutaneous Q8H  . hydrALAZINE  50 mg Oral QID  . insulin aspart  0-9 Units Subcutaneous TID WC  . insulin glargine  10 Units Subcutaneous QHS  . LORazepam  0.5 mg Oral Once  . multivitamin  1 tablet Oral Daily  . nebivolol  20 mg Oral Daily  . DISCONTD: albuterol  2.5 mg Nebulization QID    BMET    Component Value Date/Time   NA 136 10/02/2011 0530   K 3.6 10/02/2011 0530   CL 99 10/02/2011 0530   CO2 24 10/02/2011 0530   GLUCOSE 103* 10/02/2011 0530  GLUCOSE 318 03/31/2009 0815   BUN 50* 10/02/2011 0530   CREATININE 4.98* 10/02/2011 0530   CALCIUM 8.5 10/02/2011 0530   CALCIUM 8.5 04/02/2011 0514   GFRNONAA 10* 10/02/2011 0530   GFRAA 11* 10/02/2011 0530   CBC    Component Value Date/Time   WBC 11.2* 10/02/2011 0530   RBC 3.72* 10/02/2011 0530   HGB 9.7* 10/02/2011 0530   HCT 29.4* 10/02/2011 0530   PLT 153 10/02/2011 0530   MCV 79.0 10/02/2011 0530   MCH 26.1 10/02/2011 0530   MCHC 33.0 10/02/2011 0530   RDW 14.8 10/02/2011 0530   LYMPHSABS 1.1 09/27/2011 1505   MONOABS 0.5 09/27/2011 1505   EOSABS 0.0 09/27/2011 1505   BASOSABS 0.0 09/27/2011 1505     Assessment/Plan:  1. New ESRD- cont with HD and for outpt HD TTS at [facility] 2. vasc access- appreciate VVS assistance. For vein mapping, PC, and AVG/AVF. Will pull femoral cath once tunneled catheter placed 3. Anemia of chronic disease- on EPO/iron 4. HTN- cont with UF and meds 5. Disp-needs permanent access prior to DC 6. PC and AV access to be placed on Friday.  Hopeful dialysis on Sat and discharge after HD on Saturday if ok with primary svc  Brenetta Penny A

## 2011-10-02 NOTE — Progress Notes (Addendum)
Awaiting vein mapping for dialysis access procedure plan.  [name], Joy with vascular Lab - pt art duplex and Vein Mapping done 4/16, awaiting report to be read.

## 2011-10-02 NOTE — Progress Notes (Signed)
[facility] [place] TEAM 1 - Stepdown/ICU TEAM  Subjective: 41 year old female w/ history of chronic renal disease stage IV with baseline creatinine of 5.5-6 not yet on dialysis, hypertension, & diabetes that comes in for cough and shortness of breath. She relates this has progressively gotten worse. She relates this started 4 days prior to admission with productive cough and 3 days prior to admission she started developing shortness of breath. In the emergency room she was placed on CPAP and was saturating in the 80s.  The pt denies sob at present.  There is no f/c, n/v, cp, or abdom pain.  She does c/o ongoing frequent soft to loose stools.  There has been no melena or hematochezia.  Objective: Weight change:   Intake/Output Summary (Last 24 hours) at 10/02/11 1416 Last data filed at 10/02/11 1300  Gross per 24 hour  Intake    480 ml  Output    460 ml  Net     20 ml   Blood pressure 137/63, pulse 78, temperature 97.5 F (36.4 C), temperature source Oral, resp. rate 13, height 5\' 2"  (1.575 m), weight 59 kg (130 lb 1.1 oz), last menstrual period 09/16/2011, SpO2 100.00%.  CBG (last 3)   Basename 10/02/11 1243 10/02/11 0727 10/01/11 2120  GLUCAP 144* 103* 172*   Physical Exam: General: No acute respiratory distress at present Lungs: CTA B w/o wheeze or crackles  Cardiovascular: Regular rate and rhythm without murmur gallop or rub Abdomen: Nontender, nondistended, soft, bowel sounds positive, no rebound, no ascites, no appreciable mass Extremities: No significant cyanosis, clubbing, or edema B LE  Lab Results:  Basename 10/02/11 0530 09/30/11 1230  NA 136 138  K 3.6 3.9  CL 99 100  CO2 24 26  GLUCOSE 103* 164*  BUN 50* 43*  CREATININE 4.98* 4.62*  CALCIUM 8.5 8.6  MG -- --  PHOS 4.2 3.9    Basename 10/02/11 0530 09/30/11 1230  AST -- --  ALT -- --  ALKPHOS -- --  BILITOT -- --  PROT -- --  ALBUMIN 2.2* 2.6*    Basename 10/02/11 0530 09/30/11 1230  WBC  11.2* 9.0  NEUTROABS -- --  HGB 9.7* 9.2*  HCT 29.4* 28.0*  MCV 79.0 79.5  PLT 153 129*   Micro Results: Recent Results (from the past 240 hour(s))  CULTURE, BLOOD (ROUTINE X 2)     Status: Normal (Preliminary result)   Collection Time   09/27/11  4:17 PM      Component Value Range Status Comment   Specimen Description BLOOD ARM RIGHT   Final    Special Requests BOTTLES DRAWN AEROBIC AND ANAEROBIC 10CC EACH   Final    Culture  Setup Time [id number]   Final    Culture     Final    Value:        BLOOD CULTURE RECEIVED NO GROWTH TO DATE CULTURE WILL BE HELD FOR 5 DAYS BEFORE ISSUING A FINAL NEGATIVE REPORT   Report Status PENDING   Incomplete   CULTURE, BLOOD (ROUTINE X 2)     Status: Normal (Preliminary result)   Collection Time   09/27/11  4:20 PM      Component Value Range Status Comment   Specimen Description BLOOD HAND LEFT   Final    Special Requests BOTTLES DRAWN AEROBIC AND ANAEROBIC [facility]   Final    Culture  Setup Time [id number]   Final    Culture     Final  Value:        BLOOD CULTURE RECEIVED NO GROWTH TO DATE CULTURE WILL BE HELD FOR 5 DAYS BEFORE ISSUING A FINAL NEGATIVE REPORT   Report Status PENDING   Incomplete   CULTURE, SPUTUM-ASSESSMENT     Status: Normal   Collection Time   09/27/11  7:51 PM      Component Value Range Status Comment   Specimen Description SPUTUM   Final    Special Requests NONE   Final    Sputum evaluation     Final    Value: MICROSCOPIC FINDINGS SUGGEST THAT THIS SPECIMEN IS NOT REPRESENTATIVE OF LOWER RESPIRATORY SECRETIONS. PLEASE RECOLLECT.     CALLED TO [facility] RN 4.12.13 AT 2230 BY ROMEROJ   Report Status 09/27/2011 FINAL   Final   MRSA PCR SCREENING     Status: Normal   Collection Time   09/27/11 10:14 PM      Component Value Range Status Comment   MRSA by PCR NEGATIVE  NEGATIVE  Final   CLOSTRIDIUM DIFFICILE BY PCR     Status: Normal   Collection Time   10/01/11  2:17 AM      Component Value Range Status Comment   C  difficile by pcr NEGATIVE  NEGATIVE  Final     Studies/Results: All recent x-ray/radiology reports have been reviewed in detail.   Medications: I have reviewed the patient's complete medication list.  Assessment/Plan:  Acute hypoxic respiratory failure Largely due to ESRD related volume overload/pulmonary edema - steadily improving with each HD tx - HD to continue - wean to RA   DM2 Now well controlled - no change in tx plan today  HTN (hypertension), malignant  Much improved - continue to follow w/o change in med tx today  Chronic kidney disease, stage 4, severely decreased GFR   acute HD has been initiated due to refractory volume overload/pulmonary edema w/ hypoxia - VVS is following, with plans to place a permcath and fistula vs AVGG Friday   PNA (pneumonia)? Though I suspect the pt's primary resp issue has been pulmonary edema, she has in fact suffered with a fever on a number of occasions during her hospital stay - as a result, I feel it is reasonable to complete a short course of coverage for a CAP (5 days azithro + 7 days rocephine) - there are no persistent sx to suggest an ongoing occult infection  Anemia Severe Fe deficiency - tx as per usual HD protocol   Diarrhea  C. Diff negative - will give trial of imodium and follow  Dispo stable for transfer to [place], MD [facility] Office  [phone] Pager [phone]  On-Call/Text Page:      [email or web address]      password [facility]

## 2011-10-03 ENCOUNTER — Inpatient Hospital Stay (HOSPITAL_COMMUNITY): Payer: Medicaid Other

## 2011-10-03 LAB — RENAL FUNCTION PANEL
Albumin: 2.2 g/dL — ABNORMAL LOW (ref 3.5–5.2)
BUN: 60 mg/dL — ABNORMAL HIGH (ref 6–23)
CO2: 23 mEq/L (ref 19–32)
Calcium: 8.4 mg/dL (ref 8.4–10.5)
Chloride: 99 mEq/L (ref 96–112)
Creatinine, Ser: 5.64 mg/dL — ABNORMAL HIGH (ref 0.50–1.10)
GFR calc Af Amer: 10 mL/min — ABNORMAL LOW (ref >90)
GFR calc non Af Amer: 8 mL/min — ABNORMAL LOW (ref >90)
Glucose, Bld: 76 mg/dL (ref 70–99)
Phosphorus: 4.9 mg/dL — ABNORMAL HIGH (ref 2.3–4.6)
Potassium: 3.6 mEq/L (ref 3.5–5.1)
Sodium: 134 mEq/L — ABNORMAL LOW (ref 135–145)

## 2011-10-03 LAB — CBC
HCT: 28.4 % — ABNORMAL LOW (ref 36.0–46.0)
Hemoglobin: 9.3 g/dL — ABNORMAL LOW (ref 12.0–15.0)
MCH: 25.8 pg — ABNORMAL LOW (ref 26.0–34.0)
MCHC: 32.7 g/dL (ref 30.0–36.0)
MCV: 78.7 fL (ref 78.0–100.0)
Platelets: 205 10*3/uL (ref 150–400)
RBC: 3.61 MIL/uL — ABNORMAL LOW (ref 3.87–5.11)
RDW: 14.8 % (ref 11.5–15.5)
WBC: 10.9 10*3/uL — ABNORMAL HIGH (ref 4.0–10.5)

## 2011-10-03 LAB — GLUCOSE, CAPILLARY
Glucose-Capillary: 84 mg/dL (ref 70–99)
Glucose-Capillary: 121 mg/dL — ABNORMAL HIGH (ref 70–99)
Glucose-Capillary: 127 mg/dL — ABNORMAL HIGH (ref 70–99)

## 2011-10-03 LAB — FECAL LACTOFERRIN, QUANT: Fecal Lactoferrin: NEGATIVE

## 2011-10-03 NOTE — Procedures (Signed)
Patient was seen on dialysis and the procedure was supervised. BFR 300 Via fem catheter BP is 133/74.  Patient appears to be tolerating treatment well

## 2011-10-03 NOTE — Progress Notes (Signed)
Patient ID: [name], female   DOB: [birth year], 41 y.o.   MRN: [id number]  [facility] Progress Note    Subjective:   Feels ok   Objective:   BP 111/66  Pulse 85  Temp(Src) 97.5 F (36.4 C) (Oral)  Resp 18  Ht 5\' 2"  (1.575 m)  Wt 60.2 kg (132 lb 11.5 oz)  BMI 24.27 kg/m2  SpO2 90%  LMP 09/16/2011  Physical Exam: Gen:WD WN AAF in NAD CVS:no rub Resp:CTA [id number] Ext:no edema  Labs: BMET  Lab 10/03/11 0631 10/02/11 0530 09/30/11 1230 09/29/11 0440 09/28/11 1756 09/27/11 1926 09/27/11 1505  NA 134* 136 138 142 -- 137 138  K 3.6 3.6 3.9 3.8 -- 3.9 4.5  CL 99 99 100 103 -- 102 102  CO2 23 24 26 22  -- 23 23  GLUCOSE 76 103* 164* 109* -- 179* 203*  BUN 60* 50* 43* 43* -- 50* 49*  CREATININE 5.64* 4.98* 4.62* 5.19* -- 5.87* 6.11*  ALB -- -- -- -- -- -- --  CALCIUM 8.4 8.5 8.6 8.6 -- 8.9 9.0  PHOS 4.9* 4.2 3.9 -- 4.8* -- --   CBC  Lab 10/03/11 0630 10/02/11 0530 09/30/11 1230 09/27/11 1926 09/27/11 1505  WBC 10.9* 11.2* 9.0 9.2 --  NEUTROABS -- -- -- -- 9.7*  HGB 9.3* 9.7* 9.2* 10.1* --  HCT 28.4* 29.4* 28.0* 30.6* --  MCV 78.7 79.0 79.5 81.2 --  PLT 205 153 129* 206 --    @IMGRELPRIORS @ Medications:      . albuterol  2.5 mg Nebulization BID  . antiseptic oral rinse  15 mL Mouth Rinse q12n4p  . aspirin EC  81 mg Oral Daily  . azithromycin  500 mg Oral Daily  . cefTRIAXone (ROCEPHIN)  IV  1 g Intravenous Q24H  . chlorhexidine  15 mL Mouth Rinse BID  . cloNIDine  0.2 mg Oral TID  . ferric gluconate (FERRLECIT/NULECIT) IV  125 mg Intravenous Daily  . ferrous sulfate  325 mg Oral TID WC  . heparin  5,000 Units Subcutaneous Q8H  . hydrALAZINE  50 mg Oral QID  . insulin aspart  0-9 Units Subcutaneous TID WC  . insulin glargine  10 Units Subcutaneous QHS  . multivitamin  1 tablet Oral Daily  . nebivolol  20 mg Oral Daily     Assessment/ Plan:   Assessment/Plan:  1. New ESRD- cont with HD and for outpt HD TTS at [facility] 2. vasc access-  appreciate VVS assistance. For vein mapping, PC, and AVG/AVF. Will pull femoral cath once tunneled catheter placed.  Planned for permanent access tomorrow. 3. Anemia of chronic disease- on EPO/iron 4. HTN- cont with UF and meds 5. Disp-needs permanent access prior to DC 6. PC and AV access to be placed on Friday. Hopeful dialysis on Sat and discharge after HD on Saturday if ok with primary svc 7.    Donetta Potts, MD 10/03/2011, 9:55 AM

## 2011-10-03 NOTE — Progress Notes (Signed)
Utilization review complete 

## 2011-10-03 NOTE — Progress Notes (Signed)
Subjective: Patient seen and examined, denies any complaints.  Objective: Vital signs in last 24 hours: Temp:  [97.5 F (36.4 C)-98.7 F (37.1 C)] 98.6 F (37 C) (04/18 1123) Pulse Rate:  [74-88] 88  (04/18 1123) Resp:  [16-18] 18  (04/18 1123) BP: (111-194)/(30-92) 194/92 mmHg (04/18 1123) SpO2:  [90 %-100 %] 91 % (04/18 1123) Weight:  [59 kg (130 lb 1.1 oz)-61 kg (134 lb 7.7 oz)] 59 kg (130 lb 1.1 oz) (04/18 1040) Weight change:  Last BM Date: 10/01/11  Intake/Output from previous day: 04/17 0701 - 04/18 0700 In: 180 [P.O.:180] Out: 400 [Urine:400] Total I/O In: 0  Out: 1002 [Other:1002]   Physical Exam: General: Alert, awake, oriented x3, in no acute distress. Heart: Regular rate and rhythm, without murmurs, rubs, gallops. Lungs: Clear to auscultation bilaterally. Abdomen: Soft, nontender, nondistended, positive bowel sounds. Left femoral dialysis catheter in place Extremities: No clubbing cyanosis or edema with positive pedal pulses. Neuro: Grossly intact, nonfocal.    Lab Results: Results for orders placed during the hospital encounter of 09/27/11 (from the past 24 hour(s))  FECAL LACTOFERRIN     Status: Normal   Collection Time   10/02/11 12:29 PM      Component Value Range   Specimen Description STOOL     Special Requests NONE     Fecal Lactoferrin NEGATIVE     Report Status 10/03/2011 FINAL    GLUCOSE, CAPILLARY     Status: Abnormal   Collection Time   10/02/11 12:43 PM      Component Value Range   Glucose-Capillary 144 (*) 70 - 99 (mg/dL)   Comment 1 Notify RN     Comment 2 Documented in Chart    GLUCOSE, CAPILLARY     Status: Abnormal   Collection Time   10/02/11  6:00 PM      Component Value Range   Glucose-Capillary 137 (*) 70 - 99 (mg/dL)   Comment 1 Notify RN     Comment 2 Documented in Chart    GLUCOSE, CAPILLARY     Status: Abnormal   Collection Time   10/02/11  9:39 PM      Component Value Range   Glucose-Capillary 147 (*) 70 - 99 (mg/dL)     Comment 1 Notify RN     Comment 2 Documented in Chart    CBC     Status: Abnormal   Collection Time   10/03/11  6:30 AM      Component Value Range   WBC 10.9 (*) 4.0 - 10.5 (K/uL)   RBC 3.61 (*) 3.87 - 5.11 (MIL/uL)   Hemoglobin 9.3 (*) 12.0 - 15.0 (g/dL)   HCT 28.4 (*) 36.0 - 46.0 (%)   MCV 78.7  78.0 - 100.0 (fL)   MCH 25.8 (*) 26.0 - 34.0 (pg)   MCHC 32.7  30.0 - 36.0 (g/dL)   RDW 14.8  11.5 - 15.5 (%)   Platelets 205  150 - 400 (K/uL)  RENAL FUNCTION PANEL     Status: Abnormal   Collection Time   10/03/11  6:31 AM      Component Value Range   Sodium 134 (*) 135 - 145 (mEq/L)   Potassium 3.6  3.5 - 5.1 (mEq/L)   Chloride 99  96 - 112 (mEq/L)   CO2 23  19 - 32 (mEq/L)   Glucose, Bld 76  70 - 99 (mg/dL)   BUN 60 (*) 6 - 23 (mg/dL)   Creatinine, Ser 5.64 (*) 0.50 -  1.10 (mg/dL)   Calcium 8.4  8.4 - 10.5 (mg/dL)   Phosphorus 4.9 (*) 2.3 - 4.6 (mg/dL)   Albumin 2.2 (*) 3.5 - 5.2 (g/dL)   GFR calc non Af Amer 8 (*) >90 (mL/min)   GFR calc Af Amer 10 (*) >90 (mL/min)  GLUCOSE, CAPILLARY     Status: Normal   Collection Time   10/03/11 11:01 AM      Component Value Range   Glucose-Capillary 84  70 - 99 (mg/dL)    Studies/Results: No results found.  Medications:    . albuterol  2.5 mg Nebulization BID  . antiseptic oral rinse  15 mL Mouth Rinse q12n4p  . aspirin EC  81 mg Oral Daily  . azithromycin  500 mg Oral Daily  . cefTRIAXone (ROCEPHIN)  IV  1 g Intravenous Q24H  . chlorhexidine  15 mL Mouth Rinse BID  . cloNIDine  0.2 mg Oral TID  . ferric gluconate (FERRLECIT/NULECIT) IV  125 mg Intravenous Daily  . ferrous sulfate  325 mg Oral TID WC  . heparin  5,000 Units Subcutaneous Q8H  . hydrALAZINE  50 mg Oral QID  . insulin aspart  0-9 Units Subcutaneous TID WC  . insulin glargine  10 Units Subcutaneous QHS  . multivitamin  1 tablet Oral Daily  . nebivolol  20 mg Oral Daily    acetaminophen, acetaminophen, feeding supplement (NEPRO CARB STEADY), heparin, heparin,  hydrALAZINE, lidocaine, lidocaine-prilocaine, loperamide, pentafluoroprop-tetrafluoroeth     Assessment/Plan: Acute hypoxic respiratory failure  Due to ESRD related volume overload/pulmonary edema - resolving with HD  DM2  Now well controlled -continue Lantus and SSI HTN (hypertension), malignant  improved but fluctuates, continue medication and adjust when necessary New ESRD: Was dialyzed today via left femoral catheter. status post vein mapping today and plans for AV fistula and diatek placement on Friday PNA (pneumonia)?  Continue Zithromax and Rocephin for suspected CAP  Anemia  Continue ferrlecit  Diarrhea  C. Diff negative -  imodium PRN    LOS: 6 days   Jewels Langone 10/03/2011, 12:00 PM

## 2011-10-03 NOTE — Progress Notes (Signed)
[facility] [place]   HISTORY OF PRESENT ILLNESS:  This is a 41 year old female with past medical history of chronic renal disease stage IV with baseline creatinine of 5.5-6 not analysis, also past medical history of hypertension, diabetes with an unknown hemoglobin A1c that comes in for cough and shortness of breath. She relates this has progressively gotten worse. She relates this started 4 days prior to admission with productive cough and 3 days prior to admission she started developing shortness of breath. She is left handed and now on dialysis via a femoral catheter. Labs:  BMET   Lab  10/03/11 0631  10/02/11 0530  09/30/11 1230  09/29/11 0440  09/28/11 1756  09/27/11 1926  09/27/11 1505   NA  134*  136  138  142  --  137  138   K  3.6  3.6  3.9  3.8  --  3.9  4.5   CL  99  99  100  103  --  102  102   CO2  23  24  26  22   --  23  23   GLUCOSE  76  103*  164*  109*  --  179*  203*   BUN  60*  50*  43*  43*  --  50*  49*   CREATININE  5.64*  4.98*  4.62*  5.19*  --  5.87*  6.11*   ALB  --  --  --  --  --  --  --   CALCIUM  8.4  8.5  8.6  8.6  --  8.9  9.0   PHOS  4.9*  4.2  3.9  --  4.8*  --  --    CBC   Lab  10/03/11 0630  10/02/11 0530  09/30/11 1230  09/27/11 1926  09/27/11 1505   WBC  10.9*  11.2*  9.0  9.2  --   NEUTROABS  --  --  --  --  9.7*   HGB  9.3*  9.7*  9.2*  10.1*  --   HCT  28.4*  29.4*  28.0*  30.6*  --   MCV  78.7  79.0  79.5  81.2  --   PLT  205  153  129*  206  --    @IMGRELPRIORS @  Medications:    .  albuterol  2.5 mg  Nebulization  BID   .  antiseptic oral rinse  15 mL  Mouth Rinse  q12n4p   .  aspirin EC  81 mg  Oral  Daily   .  azithromycin  500 mg  Oral  Daily   .  cefTRIAXone (ROCEPHIN) IV  1 g  Intravenous  Q24H   .  chlorhexidine  15 mL  Mouth Rinse  BID   .  cloNIDine  0.2 mg  Oral  TID   .  ferric gluconate (FERRLECIT/NULECIT) IV  125 mg  Intravenous  Daily   .  ferrous sulfate  325 mg  Oral  TID WC   .  heparin   5,000 Units  Subcutaneous  Q8H   .  hydrALAZINE  50 mg  Oral  QID   .  insulin aspart  0-9 Units  Subcutaneous  TID WC   .  insulin glargine  10 Units  Subcutaneous  QHS   .  multivitamin  1 tablet  Oral  Daily   .  nebivolol  20 mg  Oral  Daily  Assessment:  ESRD    Vein mapping is complete and we will proceed to the or Friday 10-04-2011 for left AVF and diatek placement by Dr. Scot Dock.

## 2011-10-04 ENCOUNTER — Inpatient Hospital Stay (HOSPITAL_COMMUNITY): Payer: Medicaid Other | Admitting: Anesthesiology

## 2011-10-04 ENCOUNTER — Inpatient Hospital Stay (HOSPITAL_COMMUNITY): Payer: Medicaid Other | Admitting: Certified Registered"

## 2011-10-04 ENCOUNTER — Encounter (HOSPITAL_COMMUNITY)
Admission: EM | Disposition: A | Discharge status: Other Acute Inpatient Hospital | Payer: Self-pay | Source: Ambulatory Visit | Attending: Internal Medicine

## 2011-10-04 ENCOUNTER — Encounter (HOSPITAL_COMMUNITY): Payer: Self-pay | Admitting: Anesthesiology

## 2011-10-04 ENCOUNTER — Inpatient Hospital Stay (HOSPITAL_COMMUNITY): Payer: Medicaid Other

## 2011-10-04 ENCOUNTER — Encounter (HOSPITAL_COMMUNITY): Payer: Self-pay | Admitting: Certified Registered"

## 2011-10-04 DIAGNOSIS — G458 Other transient cerebral ischemic attacks and related syndromes: Secondary | ICD-10-CM

## 2011-10-04 HISTORY — PX: AVGG REMOVAL: SHX5153

## 2011-10-04 HISTORY — PX: AV FISTULA PLACEMENT: SHX1204

## 2011-10-04 HISTORY — PX: INSERTION OF DIALYSIS CATHETER: SHX1324

## 2011-10-04 LAB — BASIC METABOLIC PANEL
BUN: 23 mg/dL (ref 6–23)
CO2: 27 mEq/L (ref 19–32)
Calcium: 8.5 mg/dL (ref 8.4–10.5)
Chloride: 99 mEq/L (ref 96–112)
Creatinine, Ser: 3.76 mg/dL — ABNORMAL HIGH (ref 0.50–1.10)
GFR calc Af Amer: 16 mL/min — ABNORMAL LOW (ref >90)
GFR calc non Af Amer: 14 mL/min — ABNORMAL LOW (ref >90)
Glucose, Bld: 90 mg/dL (ref 70–99)
Potassium: 4.6 mEq/L (ref 3.5–5.1)
Sodium: 136 mEq/L (ref 135–145)

## 2011-10-04 LAB — CULTURE, BLOOD (ROUTINE X 2)
Culture  Setup Time: 201304122359
Culture  Setup Time: 201304122359
Culture: NO GROWTH
Culture: NO GROWTH

## 2011-10-04 LAB — GLUCOSE, CAPILLARY
Glucose-Capillary: 87 mg/dL (ref 70–99)
Glucose-Capillary: 85 mg/dL (ref 70–99)
Glucose-Capillary: 72 mg/dL (ref 70–99)
Glucose-Capillary: 71 mg/dL (ref 70–99)
Glucose-Capillary: 138 mg/dL — ABNORMAL HIGH (ref 70–99)
Glucose-Capillary: 168 mg/dL — ABNORMAL HIGH (ref 70–99)
Glucose-Capillary: 164 mg/dL — ABNORMAL HIGH (ref 70–99)

## 2011-10-04 LAB — CBC
HCT: 28.5 % — ABNORMAL LOW (ref 36.0–46.0)
Hemoglobin: 9.3 g/dL — ABNORMAL LOW (ref 12.0–15.0)
MCH: 26 pg (ref 26.0–34.0)
MCHC: 32.6 g/dL (ref 30.0–36.0)
MCV: 79.6 fL (ref 78.0–100.0)
Platelets: 241 10*3/uL (ref 150–400)
RBC: 3.58 MIL/uL — ABNORMAL LOW (ref 3.87–5.11)
RDW: 14.7 % (ref 11.5–15.5)
WBC: 10.7 10*3/uL — ABNORMAL HIGH (ref 4.0–10.5)

## 2011-10-04 SURGERY — REMOVAL OF ARTERIOVENOUS GORETEX GRAFT (AVGG)
Anesthesia: Monitor Anesthesia Care | Laterality: Left | Wound class: Clean

## 2011-10-04 SURGERY — INSERTION OF DIALYSIS CATHETER
Anesthesia: Monitor Anesthesia Care | Site: Neck | Laterality: Right | Wound class: Clean

## 2011-10-04 MED ORDER — FENTANYL CITRATE 0.05 MG/ML IJ SOLN: 50.0000 ug | INTRAMUSCULAR | Status: DC | PRN

## 2011-10-04 MED ORDER — MIDAZOLAM HCL 5 MG/5ML IJ SOLN
INTRAMUSCULAR | Status: DC | PRN
  Administered 2011-10-04 (×3): 0.5 mg via INTRAVENOUS

## 2011-10-04 MED ORDER — FENTANYL CITRATE 0.05 MG/ML IJ SOLN
INTRAMUSCULAR | Status: DC | PRN
  Administered 2011-10-04 (×3): 25 ug via INTRAVENOUS

## 2011-10-04 MED ORDER — OXYCODONE HCL 5 MG PO TABS
5.0000 mg | ORAL_TABLET | Freq: Four times a day (QID) | ORAL | Status: DC | PRN
  Administered 2011-10-04: 5 mg via ORAL
  Filled 2011-10-04: qty 1

## 2011-10-04 MED ORDER — HEPARIN SODIUM (PORCINE) 1000 UNIT/ML IJ SOLN
INTRAMUSCULAR | Status: DC | PRN
  Administered 2011-10-04: 5000 [IU] via INTRAVENOUS

## 2011-10-04 MED ORDER — HEPARIN SODIUM (PORCINE) 1000 UNIT/ML IJ SOLN
INTRAMUSCULAR | Status: DC | PRN
  Administered 2011-10-04: 4.6 mL

## 2011-10-04 MED ORDER — SODIUM CHLORIDE 0.9 % IR SOLN
Status: DC | PRN
  Administered 2011-10-04: 20:00:00

## 2011-10-04 MED ORDER — PROPOFOL 10 MG/ML IV EMUL
INTRAVENOUS | Status: DC | PRN
  Administered 2011-10-04: 25 ug/kg/min via INTRAVENOUS

## 2011-10-04 MED ORDER — CEFAZOLIN SODIUM 1-5 GM-% IV SOLN
1.0000 g | Freq: Three times a day (TID) | INTRAVENOUS | Status: AC
  Administered 2011-10-04: 1 g via INTRAVENOUS
  Filled 2011-10-04: qty 50

## 2011-10-04 MED ORDER — SODIUM CHLORIDE 0.9 % IV SOLN
INTRAVENOUS | Status: DC
  Administered 2011-10-04: 09:00:00 via INTRAVENOUS

## 2011-10-04 MED ORDER — DARBEPOETIN ALFA-POLYSORBATE 40 MCG/0.4ML IJ SOLN: 40.0000 ug | INTRAMUSCULAR | Status: DC

## 2011-10-04 MED ORDER — LIDOCAINE HCL (PF) 1 % IJ SOLN
INTRAMUSCULAR | Status: DC | PRN
  Administered 2011-10-04: 30 mL via INTRADERMAL

## 2011-10-04 MED ORDER — LORAZEPAM 2 MG/ML IJ SOLN: 1.0000 mg | Freq: Once | INTRAMUSCULAR | Status: AC | PRN

## 2011-10-04 MED ORDER — MIDAZOLAM HCL 2 MG/2ML IJ SOLN: 1.0000 mg | INTRAMUSCULAR | Status: DC | PRN

## 2011-10-04 MED ORDER — MORPHINE SULFATE 4 MG/ML IJ SOLN: 0.0500 mg/kg | INTRAMUSCULAR | Status: DC | PRN

## 2011-10-04 MED ORDER — HYDROMORPHONE HCL PF 1 MG/ML IJ SOLN
0.2500 mg | INTRAMUSCULAR | Status: DC | PRN
  Administered 2011-10-04: 0.5 mg via INTRAVENOUS
  Filled 2011-10-04: qty 1

## 2011-10-04 MED ORDER — HEPARIN SODIUM (PORCINE) 1000 UNIT/ML DIALYSIS
20.0000 [IU]/kg | INTRAMUSCULAR | Status: DC | PRN
  Filled 2011-10-04: qty 2

## 2011-10-04 MED ORDER — LIDOCAINE-EPINEPHRINE (PF) 1 %-1:200000 IJ SOLN
INTRAMUSCULAR | Status: DC | PRN
  Administered 2011-10-04: 30 mL

## 2011-10-04 MED ORDER — HYDROMORPHONE HCL PF 1 MG/ML IJ SOLN
0.2500 mg | INTRAMUSCULAR | Status: DC | PRN
  Administered 2011-10-04: 0.25 mg via INTRAVENOUS

## 2011-10-04 MED ORDER — SODIUM CHLORIDE 0.9 % IV SOLN
INTRAVENOUS | Status: DC | PRN
  Administered 2011-10-04: 09:00:00 via INTRAVENOUS

## 2011-10-04 MED ORDER — SODIUM CHLORIDE 0.9 % IR SOLN
Status: DC | PRN
  Administered 2011-10-04: 10:00:00

## 2011-10-04 MED ORDER — HYDROMORPHONE HCL PF 1 MG/ML IJ SOLN
0.5000 mg | INTRAMUSCULAR | Status: DC | PRN
  Administered 2011-10-05 (×2): 0.5 mg via INTRAVENOUS
  Filled 2011-10-04 (×2): qty 1

## 2011-10-04 MED ORDER — 0.9 % SODIUM CHLORIDE (POUR BTL) OPTIME
TOPICAL | Status: DC | PRN
  Administered 2011-10-04: 1000 mL

## 2011-10-04 MED ORDER — PROTAMINE SULFATE 10 MG/ML IV SOLN
INTRAVENOUS | Status: DC | PRN
  Administered 2011-10-04 (×3): 10 mg via INTRAVENOUS

## 2011-10-04 MED ORDER — SODIUM CHLORIDE 0.9 % IV SOLN
INTRAVENOUS | Status: DC | PRN
  Administered 2011-10-04: 20:00:00 via INTRAVENOUS

## 2011-10-04 MED ORDER — THROMBIN 20000 UNITS EX KIT
PACK | CUTANEOUS | Status: DC | PRN
  Administered 2011-10-04: 11:00:00 via TOPICAL

## 2011-10-04 MED ORDER — CEFAZOLIN SODIUM 1-5 GM-% IV SOLN
INTRAVENOUS | Status: AC
  Filled 2011-10-04: qty 50

## 2011-10-04 MED ORDER — LIDOCAINE HCL (PF) 1 % IJ SOLN
INTRAMUSCULAR | Status: DC | PRN
  Administered 2011-10-04: 19 mL

## 2011-10-04 SURGICAL SUPPLY — 65 items
ADH SKN CLS APL DERMABOND .7 (GAUZE/BANDAGES/DRESSINGS) ×12
BAG DECANTER FOR FLEXI CONT (MISCELLANEOUS) ×3 IMPLANT
CANISTER SUCTION 2500CC (MISCELLANEOUS) ×5 IMPLANT
CATH CANNON HEMO 15F 50CM (CATHETERS) IMPLANT
CATH CANNON HEMO 15FR 19 (HEMODIALYSIS SUPPLIES) IMPLANT
CATH CANNON HEMO 15FR 23CM (HEMODIALYSIS SUPPLIES) ×2 IMPLANT
CATH CANNON HEMO 15FR 31CM (HEMODIALYSIS SUPPLIES) IMPLANT
CATH CANNON HEMO 15FR 32 (HEMODIALYSIS SUPPLIES) IMPLANT
CATH CANNON HEMO 15FR 32CM (HEMODIALYSIS SUPPLIES) IMPLANT
CLIP TI MEDIUM 6 (CLIP) ×5 IMPLANT
CLIP TI WIDE RED SMALL 6 (CLIP) ×5 IMPLANT
CLOTH BEACON ORANGE TIMEOUT ST (SAFETY) ×5 IMPLANT
COVER PROBE W GEL 5X96 (DRAPES) ×5 IMPLANT
COVER SURGICAL LIGHT HANDLE (MISCELLANEOUS) ×10 IMPLANT
DECANTER SPIKE VIAL GLASS SM (MISCELLANEOUS) ×3 IMPLANT
DERMABOND ADVANCED (GAUZE/BANDAGES/DRESSINGS) ×3
DERMABOND ADVANCED .7 DNX12 (GAUZE/BANDAGES/DRESSINGS) ×6 IMPLANT
DRAIN PENROSE 1/2X12 LTX STRL (WOUND CARE) IMPLANT
DRAPE C-ARM 42X72 X-RAY (DRAPES) ×5 IMPLANT
DRAPE CHEST BREAST 15X10 FENES (DRAPES) ×5 IMPLANT
ELECT REM PT RETURN 9FT ADLT (ELECTROSURGICAL) ×5
ELECTRODE REM PT RTRN 9FT ADLT (ELECTROSURGICAL) ×4 IMPLANT
GAUZE SPONGE 2X2 8PLY STRL LF (GAUZE/BANDAGES/DRESSINGS) ×4 IMPLANT
GAUZE SPONGE 4X4 16PLY XRAY LF (GAUZE/BANDAGES/DRESSINGS) ×5 IMPLANT
GLOVE BIO SURGEON STRL SZ 6.5 (GLOVE) ×4 IMPLANT
GLOVE BIO SURGEON STRL SZ7 (GLOVE) ×2 IMPLANT
GLOVE BIO SURGEON STRL SZ7.5 (GLOVE) ×7 IMPLANT
GLOVE BIOGEL PI IND STRL 6.5 (GLOVE) ×2 IMPLANT
GLOVE BIOGEL PI IND STRL 7.5 (GLOVE) ×4 IMPLANT
GLOVE BIOGEL PI INDICATOR 6.5 (GLOVE) ×2
GLOVE BIOGEL PI INDICATOR 7.5 (GLOVE) ×1
GLOVE ORTHOPEDIC STR SZ6.5 (GLOVE) ×2 IMPLANT
GORE-TEX GRAFT 4-7MM  40CM ×2 IMPLANT
GOWN STRL NON-REIN LRG LVL3 (GOWN DISPOSABLE) ×10 IMPLANT
GRAFT GORETEX STRT 4-7X45 (Vascular Products) ×2 IMPLANT
KIT BASIN OR (CUSTOM PROCEDURE TRAY) ×5 IMPLANT
KIT ROOM TURNOVER OR (KITS) ×5 IMPLANT
NDL 18GX1X1/2 (RX/OR ONLY) (NEEDLE) ×3 IMPLANT
NDL HYPO 25GX1X1/2 BEV (NEEDLE) ×3 IMPLANT
NEEDLE 18GX1X1/2 (RX/OR ONLY) (NEEDLE) ×5 IMPLANT
NEEDLE 22X1 1/2 (OR ONLY) (NEEDLE) ×3 IMPLANT
NEEDLE HYPO 25GX1X1/2 BEV (NEEDLE) ×5 IMPLANT
NS IRRIG 1000ML POUR BTL (IV SOLUTION) ×5 IMPLANT
PACK CV ACCESS (CUSTOM PROCEDURE TRAY) ×5 IMPLANT
PACK SURGICAL SETUP 50X90 (CUSTOM PROCEDURE TRAY) ×3 IMPLANT
PAD ARMBOARD 7.5X6 YLW CONV (MISCELLANEOUS) ×10 IMPLANT
SOAP 2 % CHG 4 OZ (WOUND CARE) ×5 IMPLANT
SPONGE GAUZE 2X2 STER 10/PKG (GAUZE/BANDAGES/DRESSINGS) ×1
SPONGE GAUZE 4X4 12PLY (GAUZE/BANDAGES/DRESSINGS) ×3 IMPLANT
SPONGE SURGIFOAM ABS GEL 100 (HEMOSTASIS) ×2 IMPLANT
SUT ETHILON 3 0 PS 1 (SUTURE) ×5 IMPLANT
SUT PROLENE 6 0 BV (SUTURE) ×9 IMPLANT
SUT VIC AB 3-0 SH 27 (SUTURE) ×10
SUT VIC AB 3-0 SH 27X BRD (SUTURE) ×5 IMPLANT
SUT VICRYL 4-0 PS2 18IN ABS (SUTURE) ×7 IMPLANT
SYR 20CC LL (SYRINGE) ×8 IMPLANT
SYR 30ML LL (SYRINGE) IMPLANT
SYR 5ML LL (SYRINGE) ×10 IMPLANT
SYR CONTROL 10ML LL (SYRINGE) ×3 IMPLANT
SYRINGE 10CC LL (SYRINGE) ×5 IMPLANT
TAPE CLOTH SURG 4X10 WHT LF (GAUZE/BANDAGES/DRESSINGS) ×2 IMPLANT
TOWEL OR 17X24 6PK STRL BLUE (TOWEL DISPOSABLE) ×5 IMPLANT
TOWEL OR 17X26 10 PK STRL BLUE (TOWEL DISPOSABLE) ×5 IMPLANT
UNDERPAD 30X30 INCONTINENT (UNDERPADS AND DIAPERS) ×5 IMPLANT
WATER STERILE IRR 1000ML POUR (IV SOLUTION) ×5 IMPLANT

## 2011-10-04 SURGICAL SUPPLY — 39 items
ADH SKN CLS APL DERMABOND .7 (GAUZE/BANDAGES/DRESSINGS)
ADH SKN CLS LQ APL DERMABOND (GAUZE/BANDAGES/DRESSINGS) ×1
CANISTER SUCTION 2500CC (MISCELLANEOUS) ×2 IMPLANT
CLIP TI MEDIUM 6 (CLIP) ×2 IMPLANT
CLIP TI WIDE RED SMALL 6 (CLIP) ×2 IMPLANT
CLOTH BEACON ORANGE TIMEOUT ST (SAFETY) ×2 IMPLANT
COVER SURGICAL LIGHT HANDLE (MISCELLANEOUS) ×4 IMPLANT
DECANTER SPIKE VIAL GLASS SM (MISCELLANEOUS) IMPLANT
DERMABOND ADHESIVE PROPEN (GAUZE/BANDAGES/DRESSINGS) ×1
DERMABOND ADVANCED (GAUZE/BANDAGES/DRESSINGS)
DERMABOND ADVANCED .7 DNX12 (GAUZE/BANDAGES/DRESSINGS) IMPLANT
DERMABOND ADVANCED .7 DNX6 (GAUZE/BANDAGES/DRESSINGS) IMPLANT
ELECT REM PT RETURN 9FT ADLT (ELECTROSURGICAL) ×2
ELECTRODE REM PT RTRN 9FT ADLT (ELECTROSURGICAL) ×1 IMPLANT
GEL ULTRASOUND 20GR AQUASONIC (MISCELLANEOUS) IMPLANT
GLOVE BIO SURGEON STRL SZ7.5 (GLOVE) ×2 IMPLANT
GLOVE BIOGEL PI IND STRL 6.5 (GLOVE) IMPLANT
GLOVE BIOGEL PI IND STRL 7.0 (GLOVE) IMPLANT
GLOVE BIOGEL PI INDICATOR 6.5 (GLOVE) ×1
GLOVE BIOGEL PI INDICATOR 7.0 (GLOVE) ×1
GOWN PREVENTION PLUS XLARGE (GOWN DISPOSABLE) ×2 IMPLANT
GOWN STRL NON-REIN LRG LVL3 (GOWN DISPOSABLE) ×4 IMPLANT
KIT BASIN OR (CUSTOM PROCEDURE TRAY) ×2 IMPLANT
KIT ROOM TURNOVER OR (KITS) ×2 IMPLANT
NS IRRIG 1000ML POUR BTL (IV SOLUTION) ×2 IMPLANT
PACK CV ACCESS (CUSTOM PROCEDURE TRAY) ×2 IMPLANT
PAD ARMBOARD 7.5X6 YLW CONV (MISCELLANEOUS) ×4 IMPLANT
SPONGE GAUZE 4X4 12PLY (GAUZE/BANDAGES/DRESSINGS) ×1 IMPLANT
SPONGE SURGIFOAM ABS GEL 100 (HEMOSTASIS) IMPLANT
STAPLER VISISTAT 35W (STAPLE) ×1 IMPLANT
SUT PROLENE 6 0 CC (SUTURE) ×2 IMPLANT
SUT VIC AB 3-0 SH 27 (SUTURE) ×2
SUT VIC AB 3-0 SH 27X BRD (SUTURE) ×1 IMPLANT
SUT VICRYL 4-0 PS2 18IN ABS (SUTURE) ×2 IMPLANT
TAPE CLOTH SURG 4X10 WHT LF (GAUZE/BANDAGES/DRESSINGS) ×1 IMPLANT
TOWEL OR 17X24 6PK STRL BLUE (TOWEL DISPOSABLE) ×2 IMPLANT
TOWEL OR 17X26 10 PK STRL BLUE (TOWEL DISPOSABLE) ×2 IMPLANT
UNDERPAD 30X30 INCONTINENT (UNDERPADS AND DIAPERS) ×2 IMPLANT
WATER STERILE IRR 1000ML POUR (IV SOLUTION) ×2 IMPLANT

## 2011-10-04 NOTE — Op Note (Signed)
Procedure: Removal of left upper arm AV graft  Preoperative diagnosis: Ischemic steal left arm  Postoperative diagnosis: Same  Anesthesia: Local with IV sedation  Operative details: After obtaining informed consent, the patient was taken to the operating room. The patient was placed in supine position on the operating table. Next the patient's entire left upper extremity was prepped and draped in the usual sterile fashion. Local anesthesia was infiltrated at a pre-existing antecubital and axillary incision. Both incisions were reopened carried down through the subcutaneous tissues down to the level of the graft. The graft was ligated proximally just above the arterial anastomosis leaving approximately 1 cm of cuff on the artery. This was done with a 2-0 silk tie. The venous limb of the graft was ligated proximally 1 cm from the venous anastomosis with a 2-0 silk tie. The graft was then transected and removed. Both wounds were thoroughly irrigated with normal saline solution. The subcutaneous tissues were reapproximated with a running 3-0 Vicryl suture. The skin was closed with staples. The patient tolerated the procedure well and there were no complications. Instrument sponge and needle counts were correct at the end of the case. The patient noticed immediate relief of her numbness and tingling symptoms in her hand before leaving the operating room.  Ruta Hinds, MD [facility] [place] Office: [phone] Pager: [phone]

## 2011-10-04 NOTE — Interval H&P Note (Signed)
History and Physical Interval Note:  10/04/2011 9:45 AM  [name]  has presented today for surgery, with the diagnosis of ESRD  The various methods of treatment have been discussed with the patient and family. After consideration of risks, benefits and other options for treatment, the patient has consented to: LEFT ARTERIOVENOUS (AV) FISTULA CREATION OR ARTERIOVENOUS GRAFT, INSERTION OF DIALYSIS CATHETER, POSSIBLE REMOVAL OF FEMORAL DIALYSIS CATHETER. The patients' history has been reviewed, patient examined, no change in status, stable for surgery.  I have reviewed the patients' chart and labs.  Questions were answered to the patient's satisfaction.     [name] S

## 2011-10-04 NOTE — Preoperative (Signed)
Beta Blockers   Reason not to administer Beta Blockers:Not Applicable, given on floor at 1452

## 2011-10-04 NOTE — Progress Notes (Signed)
Hemodialysis outpatient note: This patient has been accepted at the [facility] dialysis center on a TTS 2nd shift schedule. The unit is expecting patient on Tues 04.23.13 at 11:00. Thank you.

## 2011-10-04 NOTE — Op Note (Signed)
NAME: [name]    MRN: [id number] DOB: Aug 04, 1970    DATE OF OPERATION: 10/04/2011  PREOP DIAGNOSIS: stage V chronic kidney disease  POSTOP DIAGNOSIS: same  PROCEDURE: ultrasound-guided placement of right IJ 23 cm Diatek catheter. Left upper arm AV graft (4-7 mm tapered PTFE graft), removal of temporary right femoral catheter  SURGEON: Judeth Cornfield. Scot Dock, MD, FACS  ASSIST: Evorn Gong PA  ANESTHESIA: local with sedation   EBL: minimal  INDICATIONS: [name] is a 41 y.o. female is on dialysis. She had vein mapping which showed that her cephalic veins and basilic veins were not usable for fistula. I was asked to place an AV graft. I interrogated with a Doppler myself on the left arm and did not find usable vein for a fistula.  FINDINGS: very small artery approximately 3 mm. The vein was also small but did take a 5 mm dilator in the axilla.  TECHNIQUE: Patient was brought to the operating room and sedated by anesthesia. The neck and upper chest were prepped and draped in the usual sterile fashion. The patient was placed in Trendelenburg. After the skin was anesthetized, and under ultrasound guidance, the right IJ was cannulated and a guidewire introduced into the superior vena cava under fluoroscopic control. The tract over the wire was dilated and then a dilator and peel-away sheath were advanced over the wire and the wire and dilator removed. The catheter was positioned in the right atrium. It was difficult to put the catheter back far enough without having the cuff close to the skin I therefore elected to retunnel it further distally. Both ports withdrew easily within with heparinized saline and filled with concentrated heparin. The catheter was secured at its exit site with a 3-0 nylon suture. The 2 small incisions were closed with 0 Vicryl. Sterile dressing was applied.  Attention was then turned to placement of a left arm AV graft. I had interrogated with the Doppler and  did not see any usable vein for a fistula. The left arm was prepped and draped in the usual sterile fashion. An incision above the antecubital level longitudinally. The artery here was very small about 3 mm. The adjacent veins were 1-1/2-2 mm. I elected therefore to place an upper arm graft. Separate longitudinal incision was made beneath the axilla after the skin was anesthetized. A high brachial vein was dissected free. It was somewhat small. It was ligated distally and it distend up with irrigation and it did accommodate a 5 mm dilator. A 4-7 mm graft is then tunneled in in the upper arm and the patient was heparinized. The brachial artery was clamped proximally and distally and a longitudinal arteriotomy was made. I had interrogated with Doppler to be sure that this was the brachial artery. Only a short segment of the 4 mm end of the graft was excised the graft spatulated and sewn end to side to the artery using continuous 6-0 Prolene suture. The graft and appropriately for anastomosis to the axillary vein. The vein was had been ligated distally and spatulated. The graft was cut to appropriate length spatulated and sewn end-to-end to the vein using 2 continuous 6-0 Prolene sutures. At the completion was a palpable thrill in the graft. There was a radial and ulnar signal with the Doppler. Stasis was obtained in the wounds. Things were closed with deep layer 3-0 Vicryl and the skin closed with 0 Vicryl. Dermabond was applied.  Next the catheter in the right groin was exposed and this was  removed and pressure held for hemostasis. A dry dressing was applied.  The patient tolerated the procedure well and transferred to the recovery room in stable condition. All needle and sponge counts were correct.  Deitra Mayo, MD, FACS [facility]  DATE OF DICTATION:   10/04/2011

## 2011-10-04 NOTE — Anesthesia Postprocedure Evaluation (Signed)
  Anesthesia Post-op Note  Patient: [name]  Procedure(s) Performed: Procedure(s) (LRB): INSERTION OF DIALYSIS CATHETER (Right) INSERTION OF ARTERIOVENOUS (AV) GORE-TEX GRAFT ARM (Left) REMOVAL OF A DIALYSIS CATHETER (Right)  Patient Location: PACU  Anesthesia Type: MAC  Level of Consciousness: awake  Airway and Oxygen Therapy: Patient Spontanous Breathing  Post-op Pain: mild  Post-op Assessment: Post-op Vital signs reviewed, Patient's Cardiovascular Status Stable, Respiratory Function Stable, Patent Airway, No signs of Nausea or vomiting and Pain level controlled  Post-op Vital Signs: stable  Complications: No apparent anesthesia complications

## 2011-10-04 NOTE — Progress Notes (Signed)
Subjective: Patient seen and examined, status post dialysis catheter placement and AVG , feeling okay not in distress  Objective: Vital signs in last 24 hours: Temp:  [98 F (36.7 C)-99.2 F (37.3 C)] 98 F (36.7 C) (04/19 1330) Pulse Rate:  [81-96] 81  (04/19 1245) Resp:  [16-20] 20  (04/19 1245) BP: (137-195)/(68-84) 195/81 mmHg (04/19 1425) SpO2:  [90 %-100 %] 100 % (04/19 1245) FiO2 (%):  [99 %-100 %] 100 % (04/19 1223) Weight:  [59.376 kg (130 lb 14.4 oz)] 59.376 kg (130 lb 14.4 oz) (04/18 2113) Weight change: -2 kg (-4 lb 6.6 oz) Last BM Date: 10/03/11  Intake/Output from previous day: 04/18 0701 - 04/19 0700 In: 120 [P.O.:120] Out: 1252 [Urine:250] Total I/O In: 550 [P.O.:100; I.V.:450] Out: 200 [Urine:180; Blood:20]   Physical Exam: General: Alert, awake, oriented x3, in no acute distress.  Heart: Regular rate and rhythm, without murmurs, rubs, gallops.  Lungs: Clear to auscultation bilaterally. Right IJ Diatek catheter in place Abdomen: Soft, nontender, nondistended, positive bowel sounds.  Extremities: No clubbing cyanosis or edema with positive pedal pulses. Left AVG  Neuro: Grossly intact, nonfocal.        Lab Results: Results for orders placed during the hospital encounter of 09/27/11 (from the past 24 hour(s))  GLUCOSE, CAPILLARY     Status: Abnormal   Collection Time   10/03/11  9:22 PM      Component Value Range   Glucose-Capillary 127 (*) 70 - 99 (mg/dL)  GLUCOSE, CAPILLARY     Status: Normal   Collection Time   10/04/11  7:39 AM      Component Value Range   Glucose-Capillary 87  70 - 99 (mg/dL)   Comment 1 Documented in Chart     Comment 2 Notify RN    BASIC METABOLIC PANEL     Status: Abnormal   Collection Time   10/04/11  8:07 AM      Component Value Range   Sodium 136  135 - 145 (mEq/L)   Potassium 4.6  3.5 - 5.1 (mEq/L)   Chloride 99  96 - 112 (mEq/L)   CO2 27  19 - 32 (mEq/L)   Glucose, Bld 90  70 - 99 (mg/dL)   BUN 23  6 - 23 (mg/dL)    Creatinine, Ser 3.76 (*) 0.50 - 1.10 (mg/dL)   Calcium 8.5  8.4 - 10.5 (mg/dL)   GFR calc non Af Amer 14 (*) >90 (mL/min)   GFR calc Af Amer 16 (*) >90 (mL/min)  CBC     Status: Abnormal   Collection Time   10/04/11  8:07 AM      Component Value Range   WBC 10.7 (*) 4.0 - 10.5 (K/uL)   RBC 3.58 (*) 3.87 - 5.11 (MIL/uL)   Hemoglobin 9.3 (*) 12.0 - 15.0 (g/dL)   HCT 28.5 (*) 36.0 - 46.0 (%)   MCV 79.6  78.0 - 100.0 (fL)   MCH 26.0  26.0 - 34.0 (pg)   MCHC 32.6  30.0 - 36.0 (g/dL)   RDW 14.7  11.5 - 15.5 (%)   Platelets 241  150 - 400 (K/uL)  GLUCOSE, CAPILLARY     Status: Normal   Collection Time   10/04/11  8:36 AM      Component Value Range   Glucose-Capillary 85  70 - 99 (mg/dL)  GLUCOSE, CAPILLARY     Status: Normal   Collection Time   10/04/11 12:18 PM      Component Value  Range   Glucose-Capillary 72  70 - 99 (mg/dL)  GLUCOSE, CAPILLARY     Status: Normal   Collection Time   10/04/11  2:20 PM      Component Value Range   Glucose-Capillary 71  70 - 99 (mg/dL)   Comment 1 Documented in Chart     Comment 2 Notify RN    GLUCOSE, CAPILLARY     Status: Abnormal   Collection Time   10/04/11  4:58 PM      Component Value Range   Glucose-Capillary 138 (*) 70 - 99 (mg/dL)    Studies/Results: Dg Chest Port 1 View  10/04/2011  *RADIOLOGY REPORT*  Clinical Data: 41 year old female status post dialysis catheter placement.  PORTABLE CHEST - 1 VIEW  Comparison: 09/29/2011 and earlier.  Findings: AP portable semi upright view 1244 hours.  New right IJ approach dual lumen tunneled catheter.  Catheter tips are at the level of the carina and cavoatrial junction.  No pneumothorax. Significantly improved ventilation and resolution of what was probably pulmonary edema.  Mild retrocardiac atelectasis remains. Stable cardiac size and mediastinal contours.  IMPRESSION: 1.  Right IJ dual lumen catheter placed with no adverse features. 2.  Resolved pulmonary edema.  Mild retrocardiac atelectasis.   Original Report Authenticated By: Randall An, M.D.    Medications:    . albuterol  2.5 mg Nebulization BID  . antiseptic oral rinse  15 mL Mouth Rinse q12n4p  . aspirin EC  81 mg Oral Daily  . azithromycin  500 mg Oral Daily  . cefTRIAXone (ROCEPHIN)  IV  1 g Intravenous Q24H  . chlorhexidine  15 mL Mouth Rinse BID  . cloNIDine  0.2 mg Oral TID  . darbepoetin (ARANESP) injection - DIALYSIS  40 mcg Intravenous Q Sat-HD  . ferric gluconate (FERRLECIT/NULECIT) IV  125 mg Intravenous Daily  . heparin  5,000 Units Subcutaneous Q8H  . hydrALAZINE  50 mg Oral QID  . insulin aspart  0-9 Units Subcutaneous TID WC  . insulin glargine  10 Units Subcutaneous QHS  . multivitamin  1 tablet Oral Daily  . nebivolol  20 mg Oral Daily  . DISCONTD: ferrous sulfate  325 mg Oral TID WC    acetaminophen, acetaminophen, feeding supplement (NEPRO CARB STEADY), heparin, heparin, heparin, hydrALAZINE, HYDROmorphone, lidocaine, lidocaine-prilocaine, loperamide, LORazepam, oxyCODONE, pentafluoroprop-tetrafluoroeth, DISCONTD: 0.9 % irrigation (POUR BTL), DISCONTD: fentaNYL, DISCONTD: fentaNYL, DISCONTD: heparin 6000 unit irrigation, DISCONTD: heparin, DISCONTD: lidocaine, DISCONTD: lidocaine-EPINEPHrine, DISCONTD: midazolam DISCONTD: midazolam, DISCONTD: Surgifoam 100 with Thrombin 20,000 units (20 ml) topical solution     . DISCONTD: sodium chloride 50 mL/hr at 10/04/11 [id number]    Assessment/Plan:  Acute hypoxic respiratory failure  Due to ESRD related volume overload/pulmonary edema - resolving with HD  DM2  Now well controlled -continue Lantus and SSI  HTN (hypertension), malignant  Blood pressure fluctuates, continue medication and adjust when necessary  New ESRD: Status post AVG and diatek placement today, scheduled for dialysis tomorrow  PNA (pneumonia)?  Continue Zithromax and Rocephin for suspected CAP  Anemia  Continue ferrlecit  Diarrhea  C. Diff negative - imodium PRN       LOS: 7  days   Roxan Yamamoto 10/04/2011, 5:34 PM

## 2011-10-04 NOTE — Transfer of Care (Signed)
Immediate Anesthesia Transfer of Care Note  Patient: [name]  Procedure(s) Performed: Procedure(s) (LRB): REMOVAL OF ARTERIOVENOUS GORETEX GRAFT ([place]) (Left)  Patient Location: PACU  Anesthesia Type: General  Level of Consciousness: awake, oriented and patient cooperative  Airway & Oxygen Therapy: Patient Spontanous Breathing and Patient connected to nasal cannula oxygen  Post-op Assessment: Report given to PACU RN and Post -op Vital signs reviewed and stable  Post vital signs: Reviewed and stable  Complications: No apparent anesthesia complications

## 2011-10-04 NOTE — Transfer of Care (Signed)
Immediate Anesthesia Transfer of Care Note  Patient: [name]  Procedure(s) Performed: Procedure(s) (LRB): INSERTION OF DIALYSIS CATHETER (Right) INSERTION OF ARTERIOVENOUS (AV) GORE-TEX GRAFT ARM (Left) REMOVAL OF A DIALYSIS CATHETER (Right)  Patient Location: PACU  Anesthesia Type: MAC  Level of Consciousness: awake, alert  and oriented  Airway & Oxygen Therapy: Patient Spontanous Breathing and Patient connected to nasal cannula oxygen  Post-op Assessment: Report given to PACU RN and Post -op Vital signs reviewed and stable  Post vital signs: Reviewed  Complications: No apparent anesthesia complications

## 2011-10-04 NOTE — Progress Notes (Signed)
Right Groin catheter removed in OR, gauze dressing in place, cdi, no hematoma noted

## 2011-10-04 NOTE — Anesthesia Procedure Notes (Signed)
Procedure Name: MAC Date/Time: 10/04/2011 9:57 AM Performed by: Carl Best K Pre-anesthesia Checklist: Patient identified, Timeout performed, Emergency Drugs available, Suction available and Patient being monitored Patient Re-evaluated:Patient Re-evaluated prior to inductionOxygen Delivery Method: Simple face mask

## 2011-10-04 NOTE — OR Nursing (Signed)
Procedure 1 insertion of right internal jugular dialysis catheter ended at 1034. Procedure 2 Insertion of Arteriovenous goretex  Graft left upper arm started at 1048.

## 2011-10-04 NOTE — Anesthesia Preprocedure Evaluation (Addendum)
Anesthesia Evaluation  Patient identified by MRN, date of birth, ID band Patient awake    Reviewed: Allergy & Precautions, H&P , NPO status , Patient's Chart, lab work & pertinent test results, reviewed documented beta blocker date and time   Airway Mallampati: II TM Distance: >3 FB     Dental  (+) Teeth Intact,    Pulmonary neg pulmonary ROS,  breath sounds clear to auscultation        Cardiovascular hypertension, Pt. on medications and Pt. on home beta blockers Rhythm:Regular Rate:Normal     Neuro/Psych negative neurological ROS     GI/Hepatic negative GI ROS, Neg liver ROS,   Endo/Other  Diabetes mellitus-, Insulin Dependent  Renal/GU DialysisRenal disease     Musculoskeletal negative musculoskeletal ROS (+)   Abdominal Normal abdominal exam  (+)   Peds  Hematology negative hematology ROS (+)   Anesthesia Other Findings   Reproductive/Obstetrics negative OB ROS                          Anesthesia Physical Anesthesia Plan  ASA: III  Anesthesia Plan: MAC   Post-op Pain Management:    Induction: Intravenous  Airway Management Planned: Mask  Additional Equipment:   Intra-op Plan:   Post-operative Plan:   Informed Consent: I have reviewed the patients History and Physical, chart, labs and discussed the procedure including the risks, benefits and alternatives for the proposed anesthesia with the patient or authorized representative who has indicated his/her understanding and acceptance.     Plan Discussed with: CRNA  Anesthesia Plan Comments:         Anesthesia Quick Evaluation

## 2011-10-04 NOTE — Progress Notes (Signed)
Patient ID: [name], female   DOB: 1971/02/09, 41 y.o.   MRN: [id number]  [facility] Progress Note    Subjective:   My arm is throbbing but o/w ok   Objective:   BP 195/81  Pulse 81  Temp(Src) 98 F (36.7 C) (Oral)  Resp 20  Ht 5\' 2"  (1.575 m)  Wt 59.376 kg (130 lb 14.4 oz)  BMI 23.94 kg/m2  SpO2 100%  LMP 09/16/2011  Physical Exam: Gen:WD WN AAF in NAD CVS:RRR Resp:CTA [id number] Ext:no edema, LUE AVG +T/B, new RIJ PC  Labs: BMET  Lab 10/04/11 0807 10/03/11 0631 10/02/11 0530 09/30/11 1230 09/29/11 0440 09/28/11 1756 09/27/11 1926  NA 136 134* 136 138 142 -- 137  K 4.6 3.6 3.6 3.9 3.8 -- 3.9  CL 99 99 99 100 103 -- 102  CO2 27 23 24 26 22  -- 23  GLUCOSE 90 76 103* 164* 109* -- 179*  BUN 23 60* 50* 43* 43* -- 50*  CREATININE 3.76* 5.64* 4.98* 4.62* 5.19* -- 5.87*  ALB -- -- -- -- -- -- --  CALCIUM 8.5 8.4 8.5 8.6 8.6 -- 8.9  PHOS -- 4.9* 4.2 3.9 -- 4.8* --   CBC  Lab 10/04/11 0807 10/03/11 0630 10/02/11 0530 09/30/11 1230  WBC 10.7* 10.9* 11.2* 9.0  NEUTROABS -- -- -- --  HGB 9.3* 9.3* 9.7* 9.2*  HCT 28.5* 28.4* 29.4* 28.0*  MCV 79.6 78.7 79.0 79.5  PLT 241 205 153 129*    @IMGRELPRIORS @ Medications:      . albuterol  2.5 mg Nebulization BID  . antiseptic oral rinse  15 mL Mouth Rinse q12n4p  . aspirin EC  81 mg Oral Daily  . azithromycin  500 mg Oral Daily  . cefTRIAXone (ROCEPHIN)  IV  1 g Intravenous Q24H  . chlorhexidine  15 mL Mouth Rinse BID  . cloNIDine  0.2 mg Oral TID  . ferric gluconate (FERRLECIT/NULECIT) IV  125 mg Intravenous Daily  . ferrous sulfate  325 mg Oral TID WC  . heparin  5,000 Units Subcutaneous Q8H  . hydrALAZINE  50 mg Oral QID  . insulin aspart  0-9 Units Subcutaneous TID WC  . insulin glargine  10 Units Subcutaneous QHS  . multivitamin  1 tablet Oral Daily  . nebivolol  20 mg Oral Daily     Assessment/ Plan:    1. New ESRD- cont with HD and for outpt HD TTS at [facility] 2. vasc access- appreciate  VVS assistance. S/p RIJ PC, and AVG by Dr. Scot Dock 10/04/11. Her femoral cath was kindly removed by Dr. Scot Dock today 3. Anemia of chronic disease- on EPO/iron 4. HTN- cont with UF and meds 5. Disp-D/c tomorrow after HD and f/u with outpt HD on 10/08/11 at [facility] 6.   Remas Sobel A 10/04/2011, 3:50 PM

## 2011-10-04 NOTE — Progress Notes (Signed)
Called to see patient for increased numbness left hand.  Upper arm graft earlier today by Dr Scot Dock.  Small artery noted.  Pt states entire hand is numb and getting worse over the last hour.  PE:  Left hand cool, no radial pulse, +doppler, +thrill in graft  A/P Ischemic steal left hand Return to OR for removal of graft   Ruta Hinds, MD [facility] [place]: [phone] Pager: [phone]

## 2011-10-04 NOTE — Anesthesia Preprocedure Evaluation (Addendum)
Anesthesia Evaluation  Patient identified by MRN, date of birth, ID band Patient awake    Reviewed: Allergy & Precautions, H&P , NPO status , Patient's Chart, lab work & pertinent test results  Airway Mallampati: I TM Distance: >3 FB Neck ROM: Full    Dental   Pulmonary    Pulmonary exam normal       Cardiovascular hypertension, Pt. on medications and Pt. on home beta blockers     Neuro/Psych    GI/Hepatic   Endo/Other  Diabetes mellitus-, Well Controlled, Type 2, Insulin Dependent  Renal/GU CRFRenal disease     Musculoskeletal   Abdominal Normal abdominal exam  (+)   Peds  Hematology   Anesthesia Other Findings   Reproductive/Obstetrics                          Anesthesia Physical Anesthesia Plan  ASA: III  Anesthesia Plan: MAC   Post-op Pain Management:    Induction: Intravenous  Airway Management Planned: Simple Face Mask  Additional Equipment:   Intra-op Plan:   Post-operative Plan:   Informed Consent: I have reviewed the patients History and Physical, chart, labs and discussed the procedure including the risks, benefits and alternatives for the proposed anesthesia with the patient or authorized representative who has indicated his/her understanding and acceptance.     Plan Discussed with: CRNA and Surgeon  Anesthesia Plan Comments:         Anesthesia Quick Evaluation

## 2011-10-04 NOTE — H&P (View-Only) (Signed)
[facility] [place]   HISTORY OF PRESENT ILLNESS:  This is a 41 year old female with past medical history of chronic renal disease stage IV with baseline creatinine of 5.5-6 not analysis, also past medical history of hypertension, diabetes with an unknown hemoglobin A1c that comes in for cough and shortness of breath. She relates this has progressively gotten worse. She relates this started 4 days prior to admission with productive cough and 3 days prior to admission she started developing shortness of breath. She is left handed and now on dialysis via a femoral catheter. Labs:  BMET   Lab  10/03/11 0631  10/02/11 0530  09/30/11 1230  09/29/11 0440  09/28/11 1756  09/27/11 1926  09/27/11 1505   NA  134*  136  138  142  --  137  138   K  3.6  3.6  3.9  3.8  --  3.9  4.5   CL  99  99  100  103  --  102  102   CO2  23  24  26  22   --  23  23   GLUCOSE  76  103*  164*  109*  --  179*  203*   BUN  60*  50*  43*  43*  --  50*  49*   CREATININE  5.64*  4.98*  4.62*  5.19*  --  5.87*  6.11*   ALB  --  --  --  --  --  --  --   CALCIUM  8.4  8.5  8.6  8.6  --  8.9  9.0   PHOS  4.9*  4.2  3.9  --  4.8*  --  --    CBC   Lab  10/03/11 0630  10/02/11 0530  09/30/11 1230  09/27/11 1926  09/27/11 1505   WBC  10.9*  11.2*  9.0  9.2  --   NEUTROABS  --  --  --  --  9.7*   HGB  9.3*  9.7*  9.2*  10.1*  --   HCT  28.4*  29.4*  28.0*  30.6*  --   MCV  78.7  79.0  79.5  81.2  --   PLT  205  153  129*  206  --    @IMGRELPRIORS @  Medications:    .  albuterol  2.5 mg  Nebulization  BID   .  antiseptic oral rinse  15 mL  Mouth Rinse  q12n4p   .  aspirin EC  81 mg  Oral  Daily   .  azithromycin  500 mg  Oral  Daily   .  cefTRIAXone (ROCEPHIN) IV  1 g  Intravenous  Q24H   .  chlorhexidine  15 mL  Mouth Rinse  BID   .  cloNIDine  0.2 mg  Oral  TID   .  ferric gluconate (FERRLECIT/NULECIT) IV  125 mg  Intravenous  Daily   .  ferrous sulfate  325 mg  Oral  TID WC   .  heparin   5,000 Units  Subcutaneous  Q8H   .  hydrALAZINE  50 mg  Oral  QID   .  insulin aspart  0-9 Units  Subcutaneous  TID WC   .  insulin glargine  10 Units  Subcutaneous  QHS   .  multivitamin  1 tablet  Oral  Daily   .  nebivolol  20 mg  Oral  Daily  Assessment:  ESRD    Vein mapping is complete and we will proceed to the or Friday 10-04-2011 for left AVF and diatek placement by Dr. Scot Dock.

## 2011-10-04 NOTE — Progress Notes (Signed)
Pt's parents are concerned with her mobilty. States she hasn't been out of bed in a week and their house isn't set up well for a wheel chair. PT says she was told she may go home tomorrow. Possible PT consult?

## 2011-10-04 NOTE — Anesthesia Postprocedure Evaluation (Signed)
  Anesthesia Post-op Note  Patient: [name]  Procedure(s) Performed: Procedure(s) (LRB): REMOVAL OF ARTERIOVENOUS GORETEX GRAFT ([place]) (Left)  Patient Location: PACU  Anesthesia Type: Mac  Level of Consciousness: awake  Airway and Oxygen Therapy: Patient Spontanous Breathing  Post-op Pain: mild  Post-op Assessment: Post-op Vital signs reviewed  Post-op Vital Signs: Reviewed  Complications: No apparent anesthesia complications

## 2011-10-04 NOTE — Preoperative (Signed)
Beta Blockers   Reason not to administer Beta Blockers:Not Applicable 

## 2011-10-04 NOTE — Discharge Instructions (Signed)
    10/04/2011 Rakeya Adem [id number] 12/19/1970  Surgeon(s): Angelia Mould, MD  Procedure(s): INSERTION OF DIALYSIS CATHETER INSERTION OF ARTERIOVENOUS (AV) GORE-TEX GRAFT ARM LUA REMOVAL OF A DIALYSIS CATHETER        x Do not stick graft for 4 weeks

## 2011-10-05 ENCOUNTER — Inpatient Hospital Stay (HOSPITAL_COMMUNITY): Payer: Medicaid Other

## 2011-10-05 LAB — GLUCOSE, CAPILLARY
Glucose-Capillary: 93 mg/dL (ref 70–99)
Glucose-Capillary: 95 mg/dL (ref 70–99)
Glucose-Capillary: 132 mg/dL — ABNORMAL HIGH (ref 70–99)

## 2011-10-05 MED ORDER — RENA-VITE PO TABS
1.0000 | ORAL_TABLET | Freq: Every day | ORAL | Status: DC
  Administered 2011-10-05 – 2011-10-07 (×3): 1 via ORAL
  Filled 2011-10-05 (×4): qty 1

## 2011-10-05 MED ORDER — SODIUM CHLORIDE 0.9 % IV BOLUS (SEPSIS)
250.0000 mL | Freq: Once | INTRAVENOUS | Status: AC
  Administered 2011-10-05: 250 mL via INTRAVENOUS

## 2011-10-05 MED ORDER — ONDANSETRON HCL 4 MG/2ML IJ SOLN
4.0000 mg | Freq: Four times a day (QID) | INTRAMUSCULAR | Status: DC | PRN
  Administered 2011-10-05 – 2011-10-06 (×3): 4 mg via INTRAVENOUS
  Filled 2011-10-05 (×2): qty 2

## 2011-10-05 MED ORDER — INSULIN ASPART 100 UNIT/ML ~~LOC~~ SOLN
0.0000 [IU] | Freq: Three times a day (TID) | SUBCUTANEOUS | Status: DC
  Administered 2011-10-07: 1 [IU] via SUBCUTANEOUS

## 2011-10-05 MED ORDER — CLONIDINE HCL 0.2 MG PO TABS: 0.2000 mg | ORAL_TABLET | Freq: Three times a day (TID) | ORAL | Status: DC

## 2011-10-05 MED ORDER — INSULIN GLARGINE 100 UNIT/ML ~~LOC~~ SOLN: 10.0000 [IU] | Freq: Every day | SUBCUTANEOUS | Status: DC

## 2011-10-05 MED ORDER — NEBIVOLOL HCL 10 MG PO TABS
20.0000 mg | ORAL_TABLET | Freq: Every day | ORAL | Status: DC
  Administered 2011-10-05 – 2011-10-06 (×2): 20 mg via ORAL
  Filled 2011-10-05 (×3): qty 2

## 2011-10-05 MED ORDER — SODIUM CHLORIDE 0.9 % IV SOLN: INTRAVENOUS | Status: DC

## 2011-10-05 MED ORDER — CLONIDINE HCL 0.2 MG PO TABS
0.2000 mg | ORAL_TABLET | Freq: Three times a day (TID) | ORAL | Status: DC
  Administered 2011-10-05 – 2011-10-08 (×8): 0.2 mg via ORAL
  Filled 2011-10-05 (×11): qty 1

## 2011-10-05 MED ORDER — LEVOFLOXACIN 500 MG PO TABS: 500.0000 mg | ORAL_TABLET | Freq: Every day | ORAL | Status: DC

## 2011-10-05 MED ORDER — ONDANSETRON HCL 4 MG/2ML IJ SOLN
INTRAMUSCULAR | Status: AC
  Administered 2011-10-05: 4 mg via INTRAVENOUS
  Filled 2011-10-05: qty 2

## 2011-10-05 MED ORDER — HYDRALAZINE HCL 50 MG PO TABS
50.0000 mg | ORAL_TABLET | Freq: Four times a day (QID) | ORAL | Status: DC
  Administered 2011-10-05 – 2011-10-08 (×8): 50 mg via ORAL
  Filled 2011-10-05 (×14): qty 1

## 2011-10-05 MED ORDER — LEVOFLOXACIN 500 MG PO TABS
500.0000 mg | ORAL_TABLET | Freq: Once | ORAL | Status: AC
  Administered 2011-10-05: 500 mg via ORAL
  Filled 2011-10-05: qty 1

## 2011-10-05 MED ORDER — LEVOFLOXACIN 500 MG PO TABS: 250.0000 mg | ORAL_TABLET | ORAL | Status: DC

## 2011-10-05 MED ORDER — INSULIN ASPART 100 UNIT/ML ~~LOC~~ SOLN: 0.0000 [IU] | Freq: Every day | SUBCUTANEOUS | Status: DC

## 2011-10-05 MED ORDER — INSULIN GLARGINE 100 UNIT/ML ~~LOC~~ SOLN
10.0000 [IU] | Freq: Every day | SUBCUTANEOUS | Status: DC
  Administered 2011-10-05 – 2011-10-06 (×2): 10 [IU] via SUBCUTANEOUS

## 2011-10-05 MED ORDER — AMLODIPINE BESYLATE 10 MG PO TABS
10.0000 mg | ORAL_TABLET | Freq: Every day | ORAL | Status: DC
  Administered 2011-10-05 – 2011-10-06 (×2): 10 mg via ORAL
  Filled 2011-10-05 (×3): qty 1

## 2011-10-05 MED ORDER — HYDRALAZINE HCL 50 MG PO TABS: 50.0000 mg | ORAL_TABLET | Freq: Four times a day (QID) | ORAL | Status: DC

## 2011-10-05 NOTE — Progress Notes (Signed)
Md was paged regarding patient being seen by physical therapy and patient complained of dizziness. Md responded and cancelled discharge for today. Patient made aware and agreed with not discharging home today.

## 2011-10-05 NOTE — Discharge Summary (Addendum)
Patient ID: [name] MRN: [id number] DOB/AGE: [birth year] 42 y.o.  Admit date: 09/27/2011 Discharge date: 10/05/2011  Primary Care Physician:  Robyn Haber, MD, MD   Discharge Diagnoses:    Present on Admission:  .Acute respiratory failure with hypoxia .DIABETES MELLITUS II, UNCOMPLICATED .HTN (hypertension), malignant . new end-stage renal disease  .PNA (pneumonia) *Steal  syndrome  Medication List  As of 10/05/2011 12:47 PM   STOP taking these medications         ferrous sulfate 325 (65 FE) MG tablet      insulin lispro protamine-insulin lispro (75-25) 100 UNIT/ML Susp      torsemide 20 MG tablet         TAKE these medications         amLODipine 10 MG tablet   Commonly known as: NORVASC   Take 10 mg by mouth daily.      aspirin EC 81 MG tablet   Take 81 mg by mouth daily.      BYSTOLIC 20 MG Tabs   Generic drug: Nebivolol HCl   Take 20 mg by mouth daily.      cloNIDine 0.2 MG tablet   Commonly known as: CATAPRES   Take 1 tablet (0.2 mg total) by mouth 3 (three) times daily.      hydrALAZINE 50 MG tablet   Commonly known as: APRESOLINE   Take 1 tablet (50 mg total) by mouth 4 (four) times daily.      insulin glargine 100 UNIT/ML injection   Commonly known as: LANTUS   Inject 10 Units into the skin at bedtime.      levofloxacin 500 MG tablet   Commonly known as: LEVAQUIN   Take 1 tablet (500 mg total) by mouth daily x 1 ,then 1/2 tablet (250 mg) q 48 hours .             Consults:  Nephrology, VVS   Significant Diagnostic Studies:  Dg Chest Port 1 View  10/04/2011  *RADIOLOGY REPORT*  Clinical Data: 41 year old female status post dialysis catheter placement.  PORTABLE CHEST - 1 VIEW  Comparison: 09/29/2011 and earlier.  Findings: AP portable semi upright view 1244 hours.  New right IJ approach dual lumen tunneled catheter.  Catheter tips are at the level of the carina and cavoatrial junction.  No pneumothorax. Significantly improved ventilation  and resolution of what was probably pulmonary edema.  Mild retrocardiac atelectasis remains. Stable cardiac size and mediastinal contours.  IMPRESSION: 1.  Right IJ dual lumen catheter placed with no adverse features. 2.  Resolved pulmonary edema.  Mild retrocardiac atelectasis.  Original Report Authenticated By: Randall An, M.D.   Dg Chest Port 1 View  09/29/2011  *RADIOLOGY REPORT*  Clinical Data: Follow up edema.  PORTABLE CHEST - 1 VIEW  Comparison: 09/28/2011  Findings: Chest radiograph demonstrates confluent airspace disease in the right lung.  Slightly improved aeration at the right lung base.  Again noted are the patchy airspace densities in the left mid lung.  Trachea is midline.  IMPRESSION: The distribution of the bilateral airspace disease has minimal changed, right side greater than left.  Slightly improved aeration at the right lung base.  Original Report Authenticated By: Markus Daft, M.D.   Dg Chest Port 1 View  09/28/2011  *RADIOLOGY REPORT*  Clinical Data: Short of breath  PORTABLE CHEST - 1 VIEW  Comparison: Chest radiograph 10/27/2010  Findings: Stable enlarged heart silhouette.  There is increased air space disease in the right upper lobe and right  lower lobe as well as the left lung to a lesser degree.  No pneumothorax.  Likely small effusions. Cardiomegaly.  The  IMPRESSION: Increasing bilateral air space disease more dense on the right consistent with worsening pulmonary edema or infection.  Sable cardiomegaly  Original Report Authenticated By: Suzy Bouchard, M.D.   Dg Chest Port 1 View  09/27/2011  *RADIOLOGY REPORT*  Clinical Data: 41 year old female with severe shortness of breath. History of CHF and diabetes.  PORTABLE CHEST - 1 VIEW  Comparison: 03/29/2011.  Findings: AP portable semi upright view 1500 hours. Stable cardiomegaly and mediastinal contours.  Confluent opacity at the right lung base.  Mildly increased retrocardiac opacity also, obscuring the left hemidiaphragm.   There is early airspace disease in the right upper lobe along the minor fissure.  The left upper lobe is clear.  No pneumothorax or large effusion. Visualized tracheal air column is within normal limits.  IMPRESSION: Bilateral lower lobe and early right upper lobe airspace disease is nonspecific but suspicious for severe pneumonia.  Original Report Authenticated By: Randall An, M.D.    Brief H and P: For complete details please refer to admission H and P, but in brief  This is a 41 year old female with past medical history of chronic renal disease stage IV with baseline creatinine of 5.5-6 not dialysis, also past medical history of hypertension, diabetes with an unknown hemoglobin A1c that comes in for cough and shortness of breath. She relates this has progressively gotten worse. She relates this started 4 days prior to admission with productive cough and 3 days prior to admission she started developing shortness of breath. Here in the emergency room had to put her on CPAP and she was saturating in the 80s. Currently here in the ED she is on a Venturi mask satting greater than 90%. She relates she has had no fever, no diarrhea, no abdominal pain, no burning when she urinates or weakness. She relates no hemoptysis PND or edema.   Hospital Course:  The patient was admitted to the down unit and she was started on hemodialysis, had breathing significantly improved. She Was and transitioned to the medical floor. Patient underwent permacath placement and AV graft yesterday however unfortunately she developed a steal syndrome and AV graft was removed yesterday. Patient was seen by Dr. Oneida Alar from vascular surgery today who recommended followup with Dr. Scot Dock in 2 weeks to remove stables and consider new access. Patient was followed by nephrology and had been arrangement made for outpatient dialysis on TTS. She was treated for suspected pneumonia with Zithromax and Rocephin, will be started on Levaquin for  2 more days to complete 7 days of antibiotics. Blood pressure is uncontrolled and fluctuates, continue the above antihypertensive regimen and adjust as an outpatient as necessary.  Patient seen and examined by me today and she is stable for discharge to home.    Filed Vitals:   10/05/11 1138  BP: 183/88  Pulse: 88  Temp:   Resp:     General: Alert, awake, oriented x3, in no acute distress.  Heart: Regular rate and rhythm, without murmurs, rubs, gallops.  Lungs: Clear to auscultation bilaterally. Right IJ Diatek catheter in place  Abdomen: Soft, nontender, nondistended, positive bowel sounds.  Extremities: No clubbing cyanosis or edema with positive pedal pulses. Status post removal of Left AVG , left hand is warm. Neuro: Grossly intact, nonfocal.   Disposition and Follow-up:  To home Followup with dialysis unit on TTS as arranged Follow his PCP  Time spent on Discharge: Approximately 45 minutes   Signed: Nidia Grogan 10/05/2011, 12:47 PM

## 2011-10-05 NOTE — Progress Notes (Addendum)
Patient ID: [name], female   DOB: Aug 19, 1970, 41 y.o.   MRN: [id number]  [facility] Progress Note    Subjective:   Events of last night noted, appreciated Dr. Oneida Alar' assistance   Objective:   BP 152/79  Pulse 79  Temp(Src) 97.3 F (36.3 C) (Oral)  Resp 16  Ht 5\' 2"  (1.575 m)  Wt 61.2 kg (134 lb 14.7 oz)  BMI 24.68 kg/m2  SpO2 98%  LMP 09/16/2011  Physical Exam: Gen:WD WN AAF in NAD CVS:RRR Resp:CTA [id number] Ext:no edema, s/o removal of LUE AVG  Labs: BMET  Lab 10/04/11 0807 10/03/11 0631 10/02/11 0530 09/30/11 1230 09/29/11 0440 09/28/11 1756  NA 136 134* 136 138 142 --  K 4.6 3.6 3.6 3.9 3.8 --  CL 99 99 99 100 103 --  CO2 27 23 24 26 22  --  GLUCOSE 90 76 103* 164* 109* --  BUN 23 60* 50* 43* 43* --  CREATININE 3.76* 5.64* 4.98* 4.62* 5.19* --  ALB -- -- -- -- -- --  CALCIUM 8.5 8.4 8.5 8.6 8.6 --  PHOS -- 4.9* 4.2 3.9 -- 4.8*   CBC  Lab 10/04/11 0807 10/03/11 0630 10/02/11 0530 09/30/11 1230  WBC 10.7* 10.9* 11.2* 9.0  NEUTROABS -- -- -- --  HGB 9.3* 9.3* 9.7* 9.2*  HCT 28.5* 28.4* 29.4* 28.0*  MCV 79.6 78.7 79.0 79.5  PLT 241 205 153 129*    @IMGRELPRIORS @ Medications:      . azithromycin  500 mg Oral Daily  .  ceFAZolin (ANCEF) IV  1 g Intravenous Q8H  . sodium chloride  250 mL Intravenous Once  . DISCONTD: albuterol  2.5 mg Nebulization BID  . DISCONTD: antiseptic oral rinse  15 mL Mouth Rinse q12n4p  . DISCONTD: aspirin EC  81 mg Oral Daily  . DISCONTD: cefTRIAXone (ROCEPHIN)  IV  1 g Intravenous Q24H  . DISCONTD: chlorhexidine  15 mL Mouth Rinse BID  . DISCONTD: cloNIDine  0.2 mg Oral TID  . DISCONTD: darbepoetin (ARANESP) injection - DIALYSIS  40 mcg Intravenous Q Sat-HD  . DISCONTD: ferric gluconate (FERRLECIT/NULECIT) IV  125 mg Intravenous Daily  . DISCONTD: ferrous sulfate  325 mg Oral TID WC  . DISCONTD: heparin  5,000 Units Subcutaneous Q8H  . DISCONTD: hydrALAZINE  50 mg Oral QID  . DISCONTD: insulin aspart   0-9 Units Subcutaneous TID WC  . DISCONTD: insulin glargine  10 Units Subcutaneous QHS  . DISCONTD: multivitamin  1 tablet Oral Daily  . DISCONTD: nebivolol  20 mg Oral Daily     Assessment/ Plan:   1. New ESRD- cont with HD and for outpt HD TTS at [facility] 2. vasc access- appreciate VVS assistance. S/p RIJ PC, and AVG by Dr. Scot Dock 10/04/11 but unfortunately developed ischemic steal and required removal of AVG last night by Dr. Oneida Alar.  Will need to be evaluated by VVS as an outpt for future access placement. 3. Anemia of chronic disease- on EPO/iron 4. HTN- cont with UF and meds (restart amlodipine 10mg  but take at bedtime) 5. Disp-D/c today after HD and f/u with outpt HD on 10/08/11 at [facility] 6.   Dola Lunsford A 10/05/2011, 9:54 AM

## 2011-10-05 NOTE — Evaluation (Signed)
Physical Therapy Evaluation Patient Details Name: [name] MRN: [id number] DOB: 07/24/1970 Today's Date: 10/05/2011 Time: [id number] PT Time Calculation (min): 33 min  PT Assessment / Plan / Recommendation Clinical Impression  Pt. was admitted with CAP and has since begun HD.  Had AV graft done and then removed same day due to arterial steal.  Pt. is significantly deconditioned and has generalized weakness.  Could not tolerate much more than sitting at EOB briefly today.  Not ready for DC home from PT standpoint and may need CIR before  DC home.    PT Assessment  Patient needs continued PT services    Follow Up Recommendations  Inpatient Rehab    Equipment Recommendations  Defer to next venue    Frequency Min 3X/week    Precautions / Restrictions Precautions Precautions: Other (comment) (AV graft inserted and removed yesterday) Restrictions Weight Bearing Restrictions: No         Mobility  Bed Mobility Bed Mobility: Rolling Right;Right Sidelying to Sit Rolling Right: 5: Supervision;With rail Right Sidelying to Sit: 3: Mod assist;HOB flat Details for Bed Mobility Assistance: cues for safety and technique Transfers Transfers: Not assessed (pt. unable due to dizziness) Ambulation/Gait Ambulation/Gait Assistance: Not tested (comment);Other (comment) (due to dizziness)    Exercises     PT Goals Acute Rehab PT Goals PT Goal Formulation: With patient Time For Goal Achievement: 10/12/11 Potential to Achieve Goals: Good Pt will go Supine/Side to Sit: with supervision PT Goal: Supine/Side to Sit - Progress: Goal set today Pt will go Sit to Supine/Side: with supervision PT Goal: Sit to Supine/Side - Progress: Goal set today Pt will go Sit to Stand: with min assist;with upper extremity assist PT Goal: Sit to Stand - Progress: Goal set today Pt will go Stand to Sit: with min assist;with upper extremity assist PT Goal: Stand to Sit - Progress: Goal set today Pt will  Ambulate: 16 - 50 feet;with min assist;with rolling walker PT Goal: Ambulate - Progress: Goal set today Pt will Perform Home Exercise Program: Independently PT Goal: Perform Home Exercise Program - Progress: Goal set today  Visit Information  Last PT Received On: 10/05/11 Assistance Needed: +2    Subjective Data  Subjective: When I get discharged, I am going to Mom and Dad's Patient Stated Goal: moving and walking   Prior [place] Lives With: Family;Other (Comment) (will stay with parents upon DC; lives alone normally) Available Help at Discharge: Available 24 hours/day;Family Type of Home: House Home Access: Stairs to enter [employer] of Steps: 4 Entrance Stairs-Rails: Left Home Layout: Laundry or work area in basement;Able to live on main level with bedroom/bathroom;Two level Bathroom Shower/Tub: Public relations account executive: None Prior Function Level of Independence: Independent Able to Take Stairs?: Yes Driving: Yes Vocation: Full time employment Comments: customer service at [employer] Communication Communication: No difficulties Dominant Hand: Left    Cognition  Overall Cognitive Status: Appears within functional limits for tasks assessed/performed Arousal/Alertness: Awake/alert Orientation Level: Appears intact for tasks assessed Behavior During Session: [facility] for tasks performed    Extremity/Trunk Assessment Right Upper Extremity Assessment RUE ROM/Strength/Tone: [facility] for tasks assessed Left Upper Extremity Assessment LUE ROM/Strength/Tone: Unable to fully assess;Due to precautions (due to AV graft insertion then removal) LUE Sensation: WFL - Light Touch Right Lower Extremity Assessment RLE ROM/Strength/Tone: [facility] for tasks assessed Left Lower Extremity Assessment LLE ROM/Strength/Tone: [facility] for tasks assessed Trunk Assessment Trunk Assessment: Normal   Balance Balance Balance Assessed:  Yes  Static Sitting Balance Static Sitting - Balance Support: Right upper extremity supported;Left upper extremity supported;Feet supported Static Sitting - Level of Assistance: 5: Stand by assistance;Other (comment) (supervision for safety)  End of Session PT - End of Session Activity Tolerance: Patient limited by fatigue;Other (comment) (dizziness) Patient left: in bed;with call bell/phone within reach Nurse Communication: Mobility status   Ladona Ridgel 10/05/2011, 4:08 PM Acute Rehabilitation Services [phone] (pager)

## 2011-10-05 NOTE — Progress Notes (Signed)
Left hand essentially back to baseline, numbness resolved.  Blood pressure 181/83, pulse 81, temperature 97.3 F (36.3 C), temperature source Oral, resp. rate 16, height 5\' 2"  (1.575 m), weight 134 lb 14.7 oz (61.2 kg), last menstrual period 09/16/2011, SpO2 98.00%.   Left hand pink warm, no radial pulse but did not have this preop Dressings dry  Resolving steal Will arrange followup with Scot Dock in 2 weeks to remove staples and consider new access  Ruta Hinds, MD [facility] Office: [phone] Pager: [phone]

## 2011-10-06 LAB — GLUCOSE, CAPILLARY
Glucose-Capillary: 101 mg/dL — ABNORMAL HIGH (ref 70–99)
Glucose-Capillary: 53 mg/dL — ABNORMAL LOW (ref 70–99)
Glucose-Capillary: 100 mg/dL — ABNORMAL HIGH (ref 70–99)
Glucose-Capillary: 87 mg/dL (ref 70–99)
Glucose-Capillary: 79 mg/dL (ref 70–99)

## 2011-10-06 MED ORDER — LEVOFLOXACIN 500 MG PO TABS
500.0000 mg | ORAL_TABLET | ORAL | Status: DC
  Administered 2011-10-07: 500 mg via ORAL
  Filled 2011-10-06: qty 1

## 2011-10-06 NOTE — Progress Notes (Signed)
Patient assisted to [facility] with assistance of two persons. Patient stated she felt dizzy and nauseated after getting up to [facility]. Vital signs taken, patient assist back to bed and zofran given per order.

## 2011-10-06 NOTE — Progress Notes (Signed)
Subjective: Events noted,discharge was cancelled yesterday because of patient's deconditioning . Patient seen and examined , denies any complaints.  Objective: Vital signs in last 24 hours: Temp:  [97.9 F (36.6 C)-98.6 F (37 C)] 98.2 F (36.8 C) (04/21 0555) Pulse Rate:  [75-88] 77  (04/21 0555) Resp:  [16-18] 18  (04/21 0555) BP: (120-183)/(56-88) 169/60 mmHg (04/21 0555) SpO2:  [92 %-100 %] 99 % (04/21 0555) Weight:  [59.9 kg (132 lb 0.9 oz)-60 kg (132 lb 4.4 oz)] 60 kg (132 lb 4.4 oz) (04/20 2118) Weight change: 0.509 kg (1 lb 1.9 oz) Last BM Date: 10/05/11  Intake/Output from previous day: 04/20 0701 - 04/21 0700 In: -  Out: 1642 [Urine:50]     Physical Exam: General: Alert, awake, oriented x3, in no acute distress.  Heart: Regular rate and rhythm, without murmurs, rubs, gallops.  Lungs: Clear to auscultation bilaterally. Right IJ Diatek catheter in place  Abdomen: Soft, nontender, nondistended, positive bowel sounds.  Extremities: No clubbing cyanosis or edema with positive pedal pulses. Left hand warm, palpable radial pulse and Neuro: Grossly intact, nonfocal.    Lab Results: Results for orders placed during the hospital encounter of 09/27/11 (from the past 24 hour(s))  GLUCOSE, CAPILLARY     Status: Normal   Collection Time   10/05/11  1:03 PM      Component Value Range   Glucose-Capillary 93  70 - 99 (mg/dL)  GLUCOSE, CAPILLARY     Status: Normal   Collection Time   10/05/11  4:57 PM      Component Value Range   Glucose-Capillary 95  70 - 99 (mg/dL)  GLUCOSE, CAPILLARY     Status: Abnormal   Collection Time   10/05/11  9:16 PM      Component Value Range   Glucose-Capillary 132 (*) 70 - 99 (mg/dL)   Comment 1 Notify RN      Studies/Results:  Medications:    . amLODipine  10 mg Oral Daily  . cloNIDine  0.2 mg Oral TID  . hydrALAZINE  50 mg Oral QID  . insulin aspart  0-5 Units Subcutaneous QHS  . insulin aspart  0-9 Units Subcutaneous TID WC  .  insulin glargine  10 Units Subcutaneous QHS  . levofloxacin  500 mg Oral Once  . multivitamin  1 tablet Oral Daily  . nebivolol  20 mg Oral Daily    HYDROmorphone, ondansetron (ZOFRAN) IV     . sodium chloride      Assessment/Plan: Acute hypoxic respiratory failure  Due to volume overload/pulmonary edema - resolved  with HD  DM2  Now well controlled, had one episode of hypoglycemia this morning, resolved -continue Lantus and SSI  HTN (hypertension), malignant  Blood pressure fluctuates, continue medication and adjust when necessary  New ESRD: Status post AVG and diatek placement , status post removal of AV graft secondary to steel syndrome. was dialyzed yesterday. PNA (pneumonia)?  Change IV antibiotics to po Levaquin for one more dose to complete 7 days of therapy For  suspected CAP  Anemia  Continue ferrlecit  Diarrhea  C. Diff negative - imodium PRN  Disposition:? CIR , will consult        LOS: 9 days   [name] 10/06/2011, 7:54 AM

## 2011-10-06 NOTE — Progress Notes (Signed)
Patient blood sugar 53mg /dL. Patient alert able to drink 4oz of orange juice and took bites of apple sauce. Blood sugar rechecked and up to 101mg /dL.

## 2011-10-06 NOTE — Progress Notes (Signed)
Physical Therapy Treatment Patient Details Name: [name] MRN: [id number] DOB: June 22, 1970 Today's Date: 10/06/2011 Time: 1350-1407 PT Time Calculation (min): 17 min  PT Assessment / Plan / Recommendation Comments on Treatment Session  Good progress in activity tolerance over yesterday's session; will likely do well in rehab -- await rehab consult    Follow Up Recommendations  Inpatient Rehab    Equipment Recommendations  Defer to next venue    Frequency Min 3X/week   Plan Discharge plan remains appropriate    Precautions / Restrictions Precautions Precautions: Other (comment);Fall (tender LUE as AV graft was placed and removed 4/19)   Pertinent Vitals/Pain VSS    Mobility  Bed Mobility Bed Mobility: Rolling Right;Right Sidelying to Sit Rolling Right: 5: Supervision;With rail Right Sidelying to Sit: 3: Mod assist;HOB flat Details for Bed Mobility Assistance: cues for safety and technique Transfers Transfers: Sit to Stand;Stand to Sit Sit to Stand: 3: Mod assist;With upper extremity assist;From bed;4: Min assist;From chair/3-in-1 (from bed and also 5 reps for strengthening and activity tol) Stand to Sit: 3: Mod assist;4: Min assist;With upper extremity assist;To chair/3-in-1 Details for Transfer Assistance: Cues for posture, safety, hand placement; also cues to self-monitor for activity tol (felt dizzy, nauseated with nursing on  [facility] earlier) Ambulation/Gait Ambulation/Gait Assistance: 4: Min assist (second person present for safety) Ambulation Distance (Feet): 4 Feet Assistive device: 2 person hand held assist Ambulation/Gait Assistance Details: short step length, pivot steps bed to chair; cues for posture    Exercises General Exercises - Lower Extremity Ankle Circles/Pumps: AROM;Both;10 reps;Supine Quad Sets: AROM;Both;10 reps;Supine Heel Slides: AROM;Both;10 reps;Supine (alternating)   PT Goals Acute Rehab PT Goals PT Goal Formulation: With patient Time For Goal  Achievement: 10/12/11 Potential to Achieve Goals: Good Pt will go Supine/Side to Sit: with supervision PT Goal: Supine/Side to Sit - Progress: Progressing toward goal Pt will go Sit to Stand: with min assist;with upper extremity assist PT Goal: Sit to Stand - Progress: Progressing toward goal Pt will go Stand to Sit: with min assist;with upper extremity assist PT Goal: Stand to Sit - Progress: Progressing toward goal Pt will Ambulate: 16 - 50 feet;with min assist;with rolling walker PT Goal: Ambulate - Progress: Progressing toward goal Pt will Perform Home Exercise Program: Independently PT Goal: Perform Home Exercise Program - Progress: Progressing toward goal  Visit Information  Last PT Received On: 10/06/11 Assistance Needed: +2 (+2 helpful, hopefully will be +1 soon)    Subjective Data  Subjective: Agreeable to OOB Patient Stated Goal: moving and walking   Cognition  Overall Cognitive Status: Appears within functional limits for tasks assessed/performed Arousal/Alertness: Awake/alert Orientation Level: Appears intact for tasks assessed Behavior During Session: [facility] for tasks performed    Balance     End of Session PT - End of Session Equipment Utilized During Treatment: Gait belt Activity Tolerance: Patient limited by fatigue;Patient tolerated treatment well (Activity tolerance is improving) Patient left: in chair;with call bell/phone within reach;with family/visitor present Nurse Communication: Mobility status    [name], [name]  10/06/2011, 5:13 PM

## 2011-10-06 NOTE — Progress Notes (Signed)
Patient ID: [name], female   DOB: [birth year], 41 y.o.   MRN: [id number] S:still here because she was too weak to walk and dizzy. O:BP 155/76  Pulse 76  Temp(Src) 98.7 F (37.1 C) (Oral)  Resp 18  Ht 5\' 2"  (1.575 m)  Wt 60 kg (132 lb 4.4 oz)  BMI 24.19 kg/m2  SpO2 97%  LMP 09/16/2011  Intake/Output Summary (Last 24 hours) at 10/06/11 1047 Last data filed at 10/06/11 0900  Gross per 24 hour  Intake    240 ml  Output   1642 ml  Net  -1402 ml   Weight change: 0.509 kg (1 lb 1.9 oz) Gen:WD WN AAF in NAD CVS:RRR Resp:CTA [id number] Ext:no edema   Lab 10/04/11 0807 10/03/11 0631 10/02/11 0530 09/30/11 1230  NA 136 134* 136 138  K 4.6 3.6 3.6 3.9  CL 99 99 99 100  CO2 27 23 24 26   GLUCOSE 90 76 103* 164*  BUN 23 60* 50* 43*  CREATININE 3.76* 5.64* 4.98* 4.62*  ALBUMIN -- 2.2* 2.2* 2.6*  CALCIUM 8.5 8.4 8.5 8.6  PHOS -- 4.9* 4.2 3.9  AST -- -- -- --  ALT -- -- -- --   Liver Function Tests:  Lab 10/03/11 0631 10/02/11 0530 09/30/11 1230  AST -- -- --  ALT -- -- --  ALKPHOS -- -- --  BILITOT -- -- --  PROT -- -- --  ALBUMIN 2.2* 2.2* 2.6*   No results found for this basename: LIPASE:3,AMYLASE:3 in the last 168 hours No results found for this basename: AMMONIA:3 in the last 168 hours CBC:  Lab 10/04/11 0807 10/03/11 0630 10/02/11 0530 09/30/11 1230  WBC 10.7* 10.9* 11.2* --  NEUTROABS -- -- -- --  HGB 9.3* 9.3* 9.7* --  HCT 28.5* 28.4* 29.4* --  MCV 79.6 78.7 79.0 79.5  PLT 241 205 153 --   Cardiac Enzymes: No results found for this basename: CKTOTAL:5,CKMB:5,CKMBINDEX:5,TROPONINI:5 in the last 168 hours CBG:  Lab 10/06/11 0829 10/06/11 0801 10/05/11 2116 10/05/11 1657 10/05/11 1303  GLUCAP 101* 53* 132* 95 93    Iron Studies: No results found for this basename: IRON,TIBC,TRANSFERRIN,FERRITIN in the last 72 hours Studies/Results: Dg Chest Port 1 View  10/04/2011  *RADIOLOGY REPORT*  Clinical Data: 41 year old female status post dialysis catheter  placement.  PORTABLE CHEST - 1 VIEW  Comparison: 09/29/2011 and earlier.  Findings: AP portable semi upright view 1244 hours.  New right IJ approach dual lumen tunneled catheter.  Catheter tips are at the level of the carina and cavoatrial junction.  No pneumothorax. Significantly improved ventilation and resolution of what was probably pulmonary edema.  Mild retrocardiac atelectasis remains. Stable cardiac size and mediastinal contours.  IMPRESSION: 1.  Right IJ dual lumen catheter placed with no adverse features. 2.  Resolved pulmonary edema.  Mild retrocardiac atelectasis.  Original Report Authenticated By: Randall An, M.D.      . amLODipine  10 mg Oral Daily  . cloNIDine  0.2 mg Oral TID  . hydrALAZINE  50 mg Oral QID  . insulin aspart  0-5 Units Subcutaneous QHS  . insulin aspart  0-9 Units Subcutaneous TID WC  . insulin glargine  10 Units Subcutaneous QHS  . levofloxacin  500 mg Oral Once  . multivitamin  1 tablet Oral Daily  . nebivolol  20 mg Oral Daily    BMET    Component Value Date/Time   NA 136 10/04/2011 0807   K 4.6 10/04/2011 [id number]  CL 99 10/04/2011 0807   CO2 27 10/04/2011 0807   GLUCOSE 90 10/04/2011 0807   GLUCOSE 318 03/31/2009 0815   BUN 23 10/04/2011 0807   CREATININE 3.76* 10/04/2011 0807   CALCIUM 8.5 10/04/2011 0807   CALCIUM 8.5 04/02/2011 0514   GFRNONAA 14* 10/04/2011 0807   GFRAA 16* 10/04/2011 0807   CBC    Component Value Date/Time   WBC 10.7* 10/04/2011 0807   RBC 3.58* 10/04/2011 0807   HGB 9.3* 10/04/2011 0807   HCT 28.5* 10/04/2011 0807   PLT 241 10/04/2011 0807   MCV 79.6 10/04/2011 0807   MCH 26.0 10/04/2011 0807   MCHC 32.6 10/04/2011 0807   RDW 14.7 10/04/2011 0807   LYMPHSABS 1.1 09/27/2011 1505   MONOABS 0.5 09/27/2011 1505   EOSABS 0.0 09/27/2011 1505   BASOSABS 0.0 09/27/2011 1505   1. Weakness/dizziness- will try to ambulate today and cont with PT/OT eval 2. New ESRD- cont with HD and for outpt HD TTS at [facility]  3. vasc access- appreciate VVS  assistance. S/p RIJ PC, and AVG by Dr. Scot Dock 10/04/11 but unfortunately developed ischemic steal and required removal of AVG last night by Dr. Oneida Alar. Will need to be evaluated by VVS as an outpt for future access placement.  4. Anemia of chronic disease- on EPO/iron  5. HTN- cont with UF and meds (restart amlodipine 10mg  but take at bedtime)  6. Disp-D/c when stable from PT/OT standpoint, d/c foley catheter to facilitate ambulation.  F/u with outpt HD on 10/08/11 at [place]

## 2011-10-07 ENCOUNTER — Encounter (HOSPITAL_COMMUNITY): Payer: Self-pay | Admitting: Vascular Surgery

## 2011-10-07 DIAGNOSIS — R5381 Other malaise: Secondary | ICD-10-CM

## 2011-10-07 LAB — CBC
HCT: 23.9 % — ABNORMAL LOW (ref 36.0–46.0)
Hemoglobin: 7.7 g/dL — ABNORMAL LOW (ref 12.0–15.0)
MCH: 26.3 pg (ref 26.0–34.0)
MCHC: 32.2 g/dL (ref 30.0–36.0)
MCV: 81.6 fL (ref 78.0–100.0)
Platelets: 307 10*3/uL (ref 150–400)
RBC: 2.93 MIL/uL — ABNORMAL LOW (ref 3.87–5.11)
RDW: 15.1 % (ref 11.5–15.5)
WBC: 9.5 10*3/uL (ref 4.0–10.5)

## 2011-10-07 LAB — RENAL FUNCTION PANEL
Albumin: 2.1 g/dL — ABNORMAL LOW (ref 3.5–5.2)
BUN: 19 mg/dL (ref 6–23)
CO2: 29 mEq/L (ref 19–32)
Calcium: 8.6 mg/dL (ref 8.4–10.5)
Chloride: 99 mEq/L (ref 96–112)
Creatinine, Ser: 4.41 mg/dL — ABNORMAL HIGH (ref 0.50–1.10)
GFR calc Af Amer: 13 mL/min — ABNORMAL LOW (ref >90)
GFR calc non Af Amer: 11 mL/min — ABNORMAL LOW (ref >90)
Glucose, Bld: 60 mg/dL — ABNORMAL LOW (ref 70–99)
Phosphorus: 4.7 mg/dL — ABNORMAL HIGH (ref 2.3–4.6)
Potassium: 3.8 mEq/L (ref 3.5–5.1)
Sodium: 137 mEq/L (ref 135–145)

## 2011-10-07 LAB — GLUCOSE, CAPILLARY
Glucose-Capillary: 59 mg/dL — ABNORMAL LOW (ref 70–99)
Glucose-Capillary: 75 mg/dL (ref 70–99)
Glucose-Capillary: 136 mg/dL — ABNORMAL HIGH (ref 70–99)
Glucose-Capillary: 120 mg/dL — ABNORMAL HIGH (ref 70–99)
Glucose-Capillary: 118 mg/dL — ABNORMAL HIGH (ref 70–99)

## 2011-10-07 MED ORDER — NEBIVOLOL HCL 10 MG PO TABS
20.0000 mg | ORAL_TABLET | Freq: Every day | ORAL | Status: DC
  Administered 2011-10-08: 20 mg via ORAL
  Filled 2011-10-07: qty 2

## 2011-10-07 MED ORDER — KIDNEY FAILURE BOOK
Freq: Once | Status: AC
  Administered 2011-10-07: 22:00:00
  Filled 2011-10-07: qty 1

## 2011-10-07 MED ORDER — INSULIN GLARGINE 100 UNIT/ML ~~LOC~~ SOLN
5.0000 [IU] | Freq: Every day | SUBCUTANEOUS | Status: DC
  Administered 2011-10-07: 5 [IU] via SUBCUTANEOUS

## 2011-10-07 NOTE — PMR Pre-admission (Signed)
PMR Admission Coordinator Pre-Admission Assessment  Patient: [name] is an 41 y.o., female MRN: [id number] DOB: 12/09/1970 Height: 5\' 2"  (157.5 cm) Weight: 61.3 kg (135 lb 2.3 oz)  Insurance Information HMO:     PPO:      PCP:      IPA:      80/20:      OTHER:  PRIMARY: self pay/Medicaid      Policy#: [id number]      Subscriber: pt Benefits:  Phone #: [phone]     Name: 10/07/11 Eff. Date: 10/07/11    Family planning waiver only. Pending MAD from 08/12/11. Due 99991111  Medicaid Application Date: [id number]      Case Manager: Rolla Plate [id number] Disability Application Date: pending hearing       Case Worker: tba  Emergency Contact Information Contact Information    Name Relation Home Work Mobile   Reen,Nancy Mother [id number]  [phone]     Current Medical History  Patient Admitting Diagnosis: Deconditioning after pneumonia with severe chronic kidney disease. New ESRD to hemodialysis  History of Present Illness:41 year old right-handed female with history of chronic renal disease with creatinine of 5-6 and followed by [facility] currently not on dialysis . Admitted April 12 with productive cough and shortness of breath that progressively worsened over a four-day period. She was placed on CPAP due to oxygen saturation in the 80s in the emergency department. Chest x-ray revealed bilateral lower lobe and early right upper lobe pneumonia. Placed on broad-spectrum antibiotics. Renal service was consulted she was ultimately placed on dialysis therapy. Patient had undergone an AVG per Dr. Scot Dock but unfortunately developed ischemic steal syndrome and required removal of AV graft 4/19 per Dr. Oneida Alar. Plan is to be further evaluated by vascular surgery for future access placement. Her latest creatinine is 3.76 on April 19. Her respiratory status continues to improve antibiotics have been changed to Levaquin to complete a 7 day course. Noted severe deconditioning related to  multi-medical issues as well as her recent respiratory status.  Patient noted to be orthostatic this morning 10/07/11. Antihypertensives on hold for now.    Past Medical History  Past Medical History  Diagnosis Date  . Chronic kidney disease   . Hypertension   . Diabetes mellitus   . Retinopathy   . Metabolic bone disease   . Anemia     Family History  family history includes Malignant hyperthermia in her father and mother.  Prior Rehab/Hospitalizations: none Current Medications  Current facility-administered medications:cloNIDine (CATAPRES) tablet 0.2 mg, 0.2 mg, Oral, TID, Silvestre Moment, MD, 0.2 mg at 10/07/11 2214;  hydrALAZINE (APRESOLINE) tablet 50 mg, 50 mg, Oral, QID, Silvestre Moment, MD, 50 mg at 10/07/11 1808;  insulin aspart (novoLOG) injection 0-5 Units, 0-5 Units, Subcutaneous, QHS, Silvestre Moment, MD insulin aspart (novoLOG) injection 0-9 Units, 0-9 Units, Subcutaneous, TID WC, Silvestre Moment, MD, 1 Units at 10/07/11 1208;  insulin glargine (LANTUS) injection 5 Units, 5 Units, Subcutaneous, QHS, Silvestre Moment, MD, 5 Units at 10/07/11 2213;  kidney failure book, , Does not apply, Once, Silvestre Moment, MD;  levofloxacin [facility]) tablet 500 mg, 500 mg, Oral, Q48H, Silvestre Moment, MD, 500 mg at 10/07/11 1808 multivitamin (RENA-VIT) tablet 1 tablet, 1 tablet, Oral, Daily, Myriam Jacobson, PA, 1 tablet at 10/07/11 2214;  nebivolol (BYSTOLIC) tablet 20 mg, 20 mg, Oral, Daily, Silvestre Moment, MD;  ondansetron [facility]) injection 4 mg, 4 mg, Intravenous, Q6H PRN, Ritta Slot, NP, 4 mg  at 10/06/11 1239;  DISCONTD: 0.9 %  sodium chloride infusion, , Intravenous, Continuous, Finis Bud, MD DISCONTD: amLODipine (NORVASC) tablet 10 mg, 10 mg, Oral, Daily, Donetta Potts, MD, 10 mg at 10/06/11 1023;  DISCONTD: HYDROmorphone (DILAUDID) injection 0.5 mg, 0.5 mg, Intravenous, Q3H PRN, Ritta Slot, NP, 0.5 mg at 10/05/11 1825;  DISCONTD: nebivolol (BYSTOLIC) tablet 20  mg, 20 mg, Oral, Daily, Silvestre Moment, MD, 20 mg at 10/06/11 1023  Patients Current Diet: Renal  Precautions / Restrictions Precautions Precautions: Fall (Dizzy and "loopy" with activity) Restrictions Weight Bearing Restrictions: No   Prior Activity Level Community (5-7x/wk): worked fulltime beginning3/20/13  Development worker, international aid / [place] Devices/Equipment: Other (Comment) (mom unable to remember ) Home Adaptive Equipment: None  Prior Functional Level Prior Function Level of Independence: Independent Able to Take Stairs?: Yes Driving: Yes Vocation: Full time employment Comments: customer service at [employer]  Current Functional Level Cognition  Arousal/Alertness: Awake/alert Overall Cognitive Status: Appears within functional limits for tasks assessed/performed Orientation Level: Oriented X4    Sensation       Coordination       ADLs       Mobility  Bed Mobility: Supine to Sit Rolling Right: 5: Supervision;With rail Right Sidelying to Sit: 3: Mod assist;HOB flat Supine to Sit: 4: Min assist;With rails    Transfers  Transfers: Sit to Stand;Stand to Sit Sit to Stand: 3: Mod assist;With upper extremity assist;From bed (2 reps) Stand to Sit: With armrests;4: Min assist    Ambulation / Gait / Stairs / Emergency planning/management officer  Ambulation/Gait Ambulation/Gait Assistance: 4: Min Wellsite geologist (Feet): 4 Feet Assistive device: 2 person hand held assist Ambulation/Gait Assistance Details: Mainly cues to self-monitor during amb for dizziness, activity tolerance; During amb, pt became less talkative, closing eyes, slow to respond to, "[place], are you okay?"; sat back down on the bed, and pt was able to answer questions, more responsive, BP 131/63 seated; Pt also became tearful, stating she wants to go home; After dizziness subsided, pt still more than willing to get up to chair Gait Pattern: Decreased step length - right;Decreased step length -  left    Posture / Balance Static Sitting Balance Static Sitting - Balance Support: Right upper extremity supported;Left upper extremity supported;Feet supported Static Sitting - Level of Assistance: 5: Stand by assistance;Other (comment) (supervision for safety)     Previous Home Environment Living Arrangements: Alone Lives With: Family;Other (Comment) (will stay with parents upon DC; lives alone normally) Type of Home: [place]: Laundry or work area in basement;Able to live on main level with bedroom/bathroom;Two level Home Access: Stairs to enter Entrance Stairs-Rails: Left Entrance Stairs-Number of Steps: 4 Bathroom Shower/Tub: Tub/shower unit;Curtain Biochemist, clinical: Standard Home Care Services: Other (Comment) (pt says she has "helper" but unable to tell more)  Discharge Living Setting Plans for Discharge Living Setting: Lives with (comment) (to go stay with her parent sfor a few weeks) Type of Home at Discharge: House Discharge Home Layout: Two level Discharge Home Access: Stairs to enter Entrance Stairs-Number of Steps: 4 steps Discharge Bathroom Shower/Tub: Tub/shower unit Discharge Bathroom Toilet: Standard Discharge Bathroom Accessibility: Yes How Accessible: Accessible via walker Do you have any problems obtaining your medications?: Yes (Describe) (has ran out of meds frequently and not told her parent )  Social/Family/Support Systems Patient Roles: Other (Comment) (employee, only child ( brother died in 09/12/2007)) Contact Information: Lajean Silvius, Mom Anticipated Caregiver: Mom and Dad until first of June. They  will be on vacation for two weeks Anticipated Caregiver's Contact Information: Home [phone]; cell [phone] Ability/Limitations of Caregiver: supervision Caregiver Availability: 24/7 Discharge Plan Discussed with Primary Caregiver: Yes Is Caregiver In Agreement with Plan?: Yes Does Caregiver/Family have Issues with Lodging/Transportation while  Pt is in Rehab?: No  Goals/Additional Needs Patient/Family Goal for Rehab: supervision to Mod I PT and OT Expected length of stay: ELOS 10 days Dietary Needs: New to dialysis so needs dietician follow up before d/c Special Service Needs: To be at [facility] outpatient dialysis. Parents can transport until June. Hopeful pt can then drive hersellf Additional Information: New ESRD to hemodialysis so has numerous questions Pt/Family Agrees to Admission and willing to participate: Yes Program Orientation Provided & Reviewed with Pt/Caregiver Including Roles  & Responsibilities: Yes  Patient Condition: This patient's condition remains as documented in the Consult dated 10/07/11, in which the Rehabilitation Physician determined and documented that the patient's condition is appropriate for intensive rehabilitative care in an inpatient rehabilitation facility.  Preadmission Screen Completed By:  Cleatrice Burke, 10/08/2011 10:09 AM ______________________________________________________________________   Discussed status with Dr.  Letta Pate on 10/08/11 at  1009 and received telephone approval for admission today.  Admission Coordinator:  Cleatrice Burke, time 1010 Date 10/08/11.

## 2011-10-07 NOTE — Progress Notes (Signed)
I met with patient and her parents at bedside. Pt will benefit from an inpatient rehab stay prior to d/c home with her parents. Bed is available 4/23. I will follow up in the am. [phone] with questions.

## 2011-10-07 NOTE — Progress Notes (Signed)
Pt requested a visit from a chaplain.  Pt likes to be called [name].  Pt is voicing frustration with God and the "meaning of her sickness".  We had an in depth conversation and  I provided emotional and spiritual support.  We ended the visit with prayer.  I will continue to follow.  Please page me if I am needed or requested. Hope Pigeon  [phone]  Personal pager   [phone]  On-call pager  10/07/11 1323  Clinical Encounter Type  Visited With Patient  Visit Type Spiritual support  Referral From Nurse  Spiritual Encounters  Spiritual Needs Emotional;Prayer  Stress Factors  Patient Stress Factors Loss of control;Health changes  Family Stress Factors Not reviewed

## 2011-10-07 NOTE — Progress Notes (Addendum)
Subjective: Patient seen and examined, she denies any complaints. However she was feeling dizzy while trying to work with PT this morning.  Objective: Vital signs in last 24 hours: Temp:  [97.3 F (36.3 C)-98.7 F (37.1 C)] 97.7 F (36.5 C) (04/22 0912) Pulse Rate:  [71-76] 73  (04/22 0912) Resp:  [16-19] 16  (04/22 0912) BP: (123-155)/(54-76) 123/54 mmHg (04/22 0912) SpO2:  [97 %-100 %] 100 % (04/22 0912) Weight:  [61 kg (134 lb 7.7 oz)] 61 kg (134 lb 7.7 oz) (04/21 2202) Weight change: -0.2 kg (-7.1 oz) Last BM Date: 10/05/11  Intake/Output from previous day: 04/21 0701 - 04/22 0700 In: 480 [P.O.:480] Out: 125 [Urine:125] Total I/O In: 120 [P.O.:120] Out: -    Physical Exam: General: Alert, awake, oriented x3, in no acute distress.  Heart: Regular rate and rhythm, without murmurs, rubs, gallops.  Lungs: Clear to auscultation bilaterally. Right IJ Diatek catheter in place  Abdomen: Soft, nontender, nondistended, positive bowel sounds.  Extremities: No clubbing cyanosis or edema with positive pedal pulses. Left hand warm, palpable radial pulse and  Neuro: Grossly intact, nonfocal.    Lab Results: Results for orders placed during the hospital encounter of 09/27/11 (from the past 24 hour(s))  GLUCOSE, CAPILLARY     Status: Abnormal   Collection Time   10/06/11 12:05 PM      Component Value Range   Glucose-Capillary 100 (*) 70 - 99 (mg/dL)  GLUCOSE, CAPILLARY     Status: Normal   Collection Time   10/06/11  5:01 PM      Component Value Range   Glucose-Capillary 87  70 - 99 (mg/dL)  GLUCOSE, CAPILLARY     Status: Normal   Collection Time   10/06/11 11:01 PM      Component Value Range   Glucose-Capillary 79  70 - 99 (mg/dL)   Comment 1 Notify RN    CBC     Status: Abnormal   Collection Time   10/07/11  6:05 AM      Component Value Range   WBC 9.5  4.0 - 10.5 (K/uL)   RBC 2.93 (*) 3.87 - 5.11 (MIL/uL)   Hemoglobin 7.7 (*) 12.0 - 15.0 (g/dL)   HCT 23.9 (*) 36.0 - 46.0  (%)   MCV 81.6  78.0 - 100.0 (fL)   MCH 26.3  26.0 - 34.0 (pg)   MCHC 32.2  30.0 - 36.0 (g/dL)   RDW 15.1  11.5 - 15.5 (%)   Platelets 307  150 - 400 (K/uL)  RENAL FUNCTION PANEL     Status: Abnormal   Collection Time   10/07/11  6:05 AM      Component Value Range   Sodium 137  135 - 145 (mEq/L)   Potassium 3.8  3.5 - 5.1 (mEq/L)   Chloride 99  96 - 112 (mEq/L)   CO2 29  19 - 32 (mEq/L)   Glucose, Bld 60 (*) 70 - 99 (mg/dL)   BUN 19  6 - 23 (mg/dL)   Creatinine, Ser 4.41 (*) 0.50 - 1.10 (mg/dL)   Calcium 8.6  8.4 - 10.5 (mg/dL)   Phosphorus 4.7 (*) 2.3 - 4.6 (mg/dL)   Albumin 2.1 (*) 3.5 - 5.2 (g/dL)   GFR calc non Af Amer 11 (*) >90 (mL/min)   GFR calc Af Amer 13 (*) >90 (mL/min)  GLUCOSE, CAPILLARY     Status: Abnormal   Collection Time   10/07/11  7:42 AM      Component Value  Range   Glucose-Capillary 59 (*) 70 - 99 (mg/dL)    Studies/Results: No results found.  Medications:    . amLODipine  10 mg Oral Daily  . cloNIDine  0.2 mg Oral TID  . hydrALAZINE  50 mg Oral QID  . insulin aspart  0-5 Units Subcutaneous QHS  . insulin aspart  0-9 Units Subcutaneous TID WC  . insulin glargine  10 Units Subcutaneous QHS  . levofloxacin  500 mg Oral Q48H  . multivitamin  1 tablet Oral Daily  . nebivolol  20 mg Oral Daily    HYDROmorphone, ondansetron (ZOFRAN) IV     . sodium chloride      Assessment/Plan:  Acute hypoxic respiratory failure  Due to volume overload/pulmonary edema - resolved with HD  DM2  She was hypoglycemic this morning, decrease Lantus to 5 units at bedtime and continue with her SSI  HTN (hypertension), malignant  Patient noted to be orthostatic this morning, systolic blood pressure dropped from 90 sitting to 60 standing, hold antihypertensives for now . She is on clonidine and obviously BP can rebound  , continue to monitor and adjust medication. New ESRD: Status post AVG and diatek placement , status post removal of AV graft secondary to steel  syndrome. Scheduled for dialysis TTS . PNA (pneumonia)?  Continue with po Levaquin for one more dose to complete 7 days of therapy For suspected CAP  Anemia  Continue ferrlecit /Epogen with dialysis. Given physical deconditioning she might benefit from blood transfusion with dialysis. Disposition:? CIR , consult placed.        LOS: 10 days   Glendell Schlottman 10/07/2011, 9:15 AM

## 2011-10-07 NOTE — Progress Notes (Signed)
Utilization Review Completed.Donne Anon T4/22/2013

## 2011-10-07 NOTE — Progress Notes (Signed)
Pt BP 174/59, Dr Melrose Nakayama notified. New parameters given for BP meds.

## 2011-10-07 NOTE — Progress Notes (Signed)
Physical Therapy Treatment Patient Details Name: [name] MRN: [id number] DOB: 1970/06/21 Today's Date: 10/07/2011 Time: [id number] PT Time Calculation (min): 24 min  PT Assessment / Plan / Recommendation Comments on Treatment Session  continuing progess with mobility and activity tolerance, even despite episode of dizziness,(likely orthostasis); will plan to start with orthohostatics next session    Follow Up Recommendations  Inpatient Rehab    Equipment Recommendations  Defer to next venue    Frequency Min 3X/week   Plan Discharge plan remains appropriate    Precautions / Restrictions Precautions Precautions: Fall (Dizzy and "loopy" with activity)   Pertinent Vitals/Pain BP 131/63 once seated EOB post dizzy episode while standing;  O2 sats remained greater than or equal to 94% during session on room air    Mobility  Bed Mobility Bed Mobility: Supine to Sit Supine to Sit: 4: Min assist;With rails Details for Bed Mobility Assistance: Cues for safety and technique Transfers Transfers: Sit to Stand;Stand to Sit Sit to Stand: 3: Mod assist;With upper extremity assist;From bed (2 reps) Stand to Sit: With armrests;4: Min assist Details for Transfer Assistance: continued cues for posture, safety, hand placement; pulled up on RW despite cues; noted decreased control with stand to sit Ambulation/Gait Ambulation/Gait Assistance: 4: Min assist Ambulation Distance (Feet): 4 Feet Assistive device: 2 person hand held assist Ambulation/Gait Assistance Details: Mainly cues to self-monitor during amb for dizziness, activity tolerance; During amb, pt became less talkative, closing eyes, slow to respond to, "[place] [name], are you okay?"; sat back down on the bed, and pt was able to answer questions, more responsive, BP 131/63 seated; Pt also became tearful, stating she wants to go home; After dizziness subsided, pt still more than willing to get up to chair Gait Pattern: Decreased step length -  right;Decreased step length - left    Exercises General Exercises - Lower Extremity Ankle Circles/Pumps: AROM;Both;10 reps;Supine Quad Sets: AROM;Both;10 reps;Supine   PT Goals Acute Rehab PT Goals Time For Goal Achievement: 10/12/11 Potential to Achieve Goals: Good Pt will go Supine/Side to Sit: with supervision PT Goal: Supine/Side to Sit - Progress: Progressing toward goal Pt will go Sit to Stand: with min assist;with upper extremity assist PT Goal: Sit to Stand - Progress: Progressing toward goal Pt will go Stand to Sit: with min assist;with upper extremity assist PT Goal: Stand to Sit - Progress: Progressing toward goal Pt will Ambulate: 16 - 50 feet;with min assist;with rolling walker PT Goal: Ambulate - Progress: Progressing toward goal (slowly)  Visit Information  Last PT Received On: 10/07/11 Assistance Needed: +2 (+2 is helpful)    Subjective Data  Subjective: Agreeable to OOB Patient Stated Goal: moving and walking   Cognition  Overall Cognitive Status: Appears within functional limits for tasks assessed/performed Arousal/Alertness: Awake/alert Orientation Level: Appears intact for tasks assessed Behavior During Session: [facility] for tasks performed    Balance     End of Session PT - End of Session Equipment Utilized During Treatment: Gait belt Activity Tolerance: Patient limited by fatigue;Patient tolerated treatment well Patient left: in chair;with call bell/phone within reach;with family/visitor present Nurse Communication: Mobility status Called Chaplain's office and requested a visit    Quin Hoop Crenshaw, [place]  10/07/2011, 11:00 AM

## 2011-10-07 NOTE — Consult Note (Signed)
Physical Medicine and Rehabilitation Consult Reason for Consult: Deconditioning/acute respiratory failure Referring Phsyician: Triad Jenavive Adderly is an 41 y.o. female.   HPI: 41 year old right-handed female with history of chronic renal disease with creatinine of 5-6 and followed by [facility] currently not on dialysis . Admitted April 12 with productive cough and shortness of breath that progressively worsened over a four-day period. She was placed on CPAP due to oxygen saturation in the 80s in the emergency department. Chest x-ray revealed bilateral lower lobe and early right upper lobe pneumonia. Placed on broad-spectrum antibiotics. Renal service was consulted she was ultimately placed on dialysis therapy. Patient had undergone an AVG per Dr. Scot Dock but unfortunately developed ischemic steal syndrome and required removal of AV graft 4/19 per Dr. Oneida Alar. Plan is to be further evaluated by vascular surgery for future access placement. Her latest creatinine is 3.76 on April 19. Her respiratory status continues to improve antibiotics have been changed to Levaquin to complete a 7 day course. Noted severe deconditioning related to multi-medical issues as well as her recent respiratory status. Physical therapy recommends physical medicine rehabilitation consult to consider inpatient rehabilitation services.  Review of Systems  Constitutional: Positive for chills and malaise/fatigue.  Respiratory: Positive for sputum production and shortness of breath.   Musculoskeletal: Positive for joint pain.  All other systems reviewed and are negative.   Past Medical History  Diagnosis Date  . Chronic kidney disease   . Hypertension   . Diabetes mellitus   . Retinopathy   . Metabolic bone disease   . Anemia    History reviewed. No pertinent past surgical history. Family History  Problem Relation Age of Onset  . Malignant hyperthermia Mother   . Malignant hyperthermia Father    Social  History:  reports that she has never smoked. She does not have any smokeless tobacco history on file. She reports that she does not drink alcohol or use illicit drugs. Allergies: No Known Allergies Medications Prior to Admission  Medication Dose Route Frequency Provider Last Rate Last Dose  . 0.9 %  sodium chloride infusion   Intravenous Continuous Finis Bud, MD      . alteplase (CATHFLO ACTIVASE) injection 2 mg  2 mg Intracatheter Once PRN Sol Blazing, MD      . amLODipine (NORVASC) tablet 10 mg  10 mg Oral Daily Donetta Potts, MD   10 mg at 10/06/11 1023  . azithromycin (ZITHROMAX) 500 mg in dextrose 5 % 250 mL IVPB  500 mg Intravenous Once Richarda Blade, MD   500 mg at 09/27/11 1640  . azithromycin (ZITHROMAX) tablet 500 mg  500 mg Oral Daily Cherene Altes, MD   500 mg at 10/04/11 1453  . ceFAZolin (ANCEF) IVPB 1 g/50 mL premix  1 g Intravenous Q8H Elam Dutch, MD   1 g at 10/04/11 2057  . cefTRIAXone (ROCEPHIN) 1 g in dextrose 5 % 50 mL IVPB  1 g Intravenous Once Richarda Blade, MD   1 g at 09/27/11 1756  . cloNIDine (CATAPRES) tablet 0.2 mg  0.2 mg Oral TID Silvestre Moment, MD   0.2 mg at 10/06/11 2305  . furosemide (LASIX) 160 mg in dextrose 5 % 50 mL IVPB  160 mg Intravenous Once Sol Blazing, MD   160 mg at 09/28/11 1744  . furosemide (LASIX) injection 80 mg  80 mg Intravenous Once Charlynne Cousins, MD   80 mg at 09/28/11 1106  . hydrALAZINE (APRESOLINE)  tablet 50 mg  50 mg Oral QID Silvestre Moment, MD   50 mg at 10/06/11 2305  . HYDROmorphone (DILAUDID) injection 0.5 mg  0.5 mg Intravenous Q3H PRN Ritta Slot, NP   0.5 mg at 10/05/11 1825  . insulin aspart (novoLOG) injection 0-5 Units  0-5 Units Subcutaneous QHS Silvestre Moment, MD      . insulin aspart (novoLOG) injection 0-9 Units  0-9 Units Subcutaneous TID WC Silvestre Moment, MD      . insulin glargine (LANTUS) injection 10 Units  10 Units Subcutaneous QHS Silvestre Moment, MD   10 Units at  10/06/11 2305  . levofloxacin (LEVAQUIN) tablet 500 mg  500 mg Oral Once Silvestre Moment, MD   500 mg at 10/05/11 1818  . levofloxacin (LEVAQUIN) tablet 500 mg  500 mg Oral Q48H Silvestre Moment, MD      . LORazepam (ATIVAN) injection 0.5 mg  0.5 mg Intravenous Once Sol Blazing, MD   0.5 mg at 09/28/11 1845  . LORazepam (ATIVAN) injection 0.5 mg  0.5 mg Intravenous Once Sol Blazing, MD   0.5 mg at 09/28/11 1846  . LORazepam (ATIVAN) injection 1 mg  1 mg Intravenous Once PRN Leda Quail, MD      . LORazepam (ATIVAN) tablet 0.5 mg  0.5 mg Oral Once Ambrose Finland, NP   0.5 mg at 10/01/11 2320  . multivitamin (RENA-VIT) tablet 1 tablet  1 tablet Oral Daily Myriam Jacobson, PA   1 tablet at 10/06/11 2305  . nebivolol (BYSTOLIC) tablet 20 mg  20 mg Oral Daily Silvestre Moment, MD   20 mg at 10/06/11 1023  . ondansetron (ZOFRAN) 4 MG/2ML injection           . ondansetron (ZOFRAN) injection 4 mg  4 mg Intravenous Q6H PRN Ritta Slot, NP   4 mg at 10/06/11 1239  . sodium chloride 0.9 % bolus 250 mL  250 mL Intravenous Once Ritta Slot, NP   250 mL at 10/05/11 0509  . DISCONTD: 0.9 %  sodium chloride infusion   Intravenous Continuous Barbara Cower, MD 125 mL/hr at 09/27/11 1641    . DISCONTD: 0.9 %  sodium chloride infusion  250 mL Intravenous PRN Charlynne Cousins, MD      . DISCONTD: 0.9 %  sodium chloride infusion  100 mL Intravenous PRN Sol Blazing, MD      . DISCONTD: 0.9 %  sodium chloride infusion  100 mL Intravenous PRN Sol Blazing, MD      . DISCONTD: 0.9 %  sodium chloride infusion   Intravenous Continuous Leda Quail, MD 50 mL/hr at 10/04/11 [phone]    . DISCONTD: 0.9 % irrigation (POUR BTL)    PRN Angelia Mould, MD   1,000 mL at 10/04/11 1022  . DISCONTD: 0.9 % irrigation (POUR BTL)    PRN Elam Dutch, MD   1,000 mL at 10/04/11 2023  . DISCONTD: acetaminophen (TYLENOL) suppository 650 mg  650 mg Rectal Q6H PRN Ritta Slot, NP   650 mg at 09/27/11 2135  .  DISCONTD: acetaminophen (TYLENOL) tablet 1,000 mg  1,000 mg Oral Once Richarda Blade, MD      . DISCONTD: acetaminophen (TYLENOL) tablet 650 mg  650 mg Oral Q6H PRN Cherene Altes, MD      . DISCONTD: albuterol (PROVENTIL) (5 MG/ML) 0.5% nebulizer solution 2.5 mg  2.5 mg Nebulization Q4H PRN Tammi Klippel  Aileen Fass, MD   2.5 mg at 09/27/11 1942  . DISCONTD: albuterol (PROVENTIL) (5 MG/ML) 0.5% nebulizer solution 2.5 mg  2.5 mg Nebulization Q4H Charlynne Cousins, MD   2.5 mg at 09/30/11 1132  . DISCONTD: albuterol (PROVENTIL) (5 MG/ML) 0.5% nebulizer solution 2.5 mg  2.5 mg Nebulization QID Cherene Altes, MD   2.5 mg at 10/02/11 0839  . DISCONTD: albuterol (PROVENTIL) (5 MG/ML) 0.5% nebulizer solution 2.5 mg  2.5 mg Nebulization BID Woodroe Chen, RRT   2.5 mg at 10/03/11 2041  . DISCONTD: amLODipine (NORVASC) tablet 10 mg  10 mg Oral Daily Charlynne Cousins, MD   10 mg at 09/27/11 2138  . DISCONTD: antiseptic oral rinse (BIOTENE) solution 15 mL  15 mL Mouth Rinse q12n4p Charlynne Cousins, MD   15 mL at 10/04/11 1756  . DISCONTD: aspirin EC tablet 81 mg  81 mg Oral Daily Charlynne Cousins, MD   81 mg at 10/04/11 1453  . DISCONTD: azithromycin (ZITHROMAX) tablet 500 mg  500 mg Oral Q24H Charlynne Cousins, MD   500 mg at 09/29/11 2219  . DISCONTD: cefTRIAXone (ROCEPHIN) 1 g in dextrose 5 % 50 mL IVPB  1 g Intravenous Q24H Charlynne Cousins, MD   1 g at 09/30/11 0048  . DISCONTD: cefTRIAXone (ROCEPHIN) 1 g in dextrose 5 % 50 mL IVPB  1 g Intravenous Q24H Cherene Altes, MD   1 g at 10/04/11 1848  . DISCONTD: chlorhexidine (PERIDEX) 0.12 % solution 15 mL  15 mL Mouth Rinse BID Charlynne Cousins, MD   15 mL at 10/05/11 0030  . DISCONTD: cloNIDine (CATAPRES - Dosed in mg/24 hr) patch 0.2 mg  0.2 mg Transdermal Weekly Charlynne Cousins, MD   0.2 mg at 09/28/11 1744  . DISCONTD: cloNIDine (CATAPRES) tablet 0.1 mg  0.1 mg Oral BID Charlynne Cousins, MD   0.1 mg at 09/27/11 2136  .  DISCONTD: cloNIDine (CATAPRES) tablet 0.1 mg  0.1 mg Oral BID Charlynne Cousins, MD   0.1 mg at 09/29/11 2219  . DISCONTD: cloNIDine (CATAPRES) tablet 0.2 mg  0.2 mg Oral TID Cherene Altes, MD   0.2 mg at 10/05/11 0022  . DISCONTD: darbepoetin (ARANESP) injection 40 mcg  40 mcg Intravenous Q Sat-HD Donetta Potts, MD      . DISCONTD: feeding supplement (NEPRO CARB STEADY) liquid 237 mL  237 mL Oral PRN Sol Blazing, MD      . DISCONTD: fentaNYL (SUBLIMAZE) injection 50-100 mcg  50-100 mcg Intravenous PRN Napoleon Form, MD      . DISCONTD: fentaNYL (SUBLIMAZE) injection 50-100 mcg  50-100 mcg Intravenous PRN Leda Quail, MD      . DISCONTD: ferric gluconate (NULECIT) 125 mg in sodium chloride 0.9 % 100 mL IVPB  125 mg Intravenous Daily Sol Blazing, MD   125 mg at 10/04/11 1748  . DISCONTD: ferric gluconate (NULECIT) 25 mg in sodium chloride 0.9 % 50 mL IVPB  25 mg Intravenous Once Sol Blazing, MD      . DISCONTD: ferrous sulfate tablet 325 mg  325 mg Oral TID WC Charlynne Cousins, MD   325 mg at 10/04/11 1514  . DISCONTD: heparin 6,000 Units in sodium chloride irrigation 0.9 % 500 mL irrigation    PRN Angelia Mould, MD      . DISCONTD: heparin 6,000 Units in sodium chloride irrigation 0.9 % 500 mL irrigation  PRN Elam Dutch, MD      . DISCONTD: heparin injection 1,000 Units  1,000 Units Dialysis PRN Sol Blazing, MD      . DISCONTD: heparin injection 1,200 Units  20 Units/kg Dialysis PRN Donetta Potts, MD   1,200 Units at 10/03/11 [phone]  . DISCONTD: heparin injection 1,200 Units  20 Units/kg Dialysis PRN Donetta Potts, MD      . DISCONTD: heparin injection 1,400 Units  20 Units/kg Dialysis PRN Sol Blazing, MD      . DISCONTD: heparin injection 2,000 Units  2,000 Units Dialysis PRN Sol Blazing, MD   2,000 Units at 09/29/11 1010  . DISCONTD: heparin injection 2,000 Units  2,000 Units Dialysis PRN Sol Blazing, MD   2,000 Units at  09/30/11 1234  . DISCONTD: heparin injection 5,000 Units  5,000 Units Subcutaneous Q8H Charlynne Cousins, MD   5,000 Units at 10/05/11 0031  . DISCONTD: heparin injection    PRN Angelia Mould, MD   4.6 mL at 10/04/11 1023  . DISCONTD: hydrALAZINE (APRESOLINE) injection 10 mg  10 mg Intravenous Q6H PRN Charlynne Cousins, MD   10 mg at 09/27/11 2136  . DISCONTD: hydrALAZINE (APRESOLINE) injection 10 mg  10 mg Intravenous Q6H PRN Charlynne Cousins, MD   10 mg at 10/03/11 0545  . DISCONTD: hydrALAZINE (APRESOLINE) injection 10 mg  10 mg Intravenous Q6H PRN Charlynne Cousins, MD      . DISCONTD: hydrALAZINE (APRESOLINE) tablet 25 mg  25 mg Oral QID Charlynne Cousins, MD      . DISCONTD: hydrALAZINE (APRESOLINE) tablet 25 mg  25 mg Oral QID Charlynne Cousins, MD   25 mg at 09/29/11 2219  . DISCONTD: hydrALAZINE (APRESOLINE) tablet 50 mg  50 mg Oral QID Cherene Altes, MD   50 mg at 10/05/11 0022  . DISCONTD: HYDROmorphone (DILAUDID) injection 0.25-0.5 mg  0.25-0.5 mg Intravenous Q5 min PRN Leda Quail, MD   0.5 mg at 10/04/11 1646  . DISCONTD: HYDROmorphone (DILAUDID) injection 0.25-0.5 mg  0.25-0.5 mg Intravenous Q5 min PRN Finis Bud, MD   0.25 mg at 10/04/11 2140  . DISCONTD: insulin aspart (novoLOG) injection 0-9 Units  0-9 Units Subcutaneous TID WC Charlynne Cousins, MD   2 Units at 09/28/11 [phone]  . DISCONTD: insulin aspart (novoLOG) injection 0-9 Units  0-9 Units Subcutaneous TID WC Charlynne Cousins, MD   1 Units at 10/04/11 1745  . DISCONTD: insulin glargine (LANTUS) injection 10 Units  10 Units Subcutaneous QHS Charlynne Cousins, MD   10 Units at 09/27/11 2255  . DISCONTD: insulin glargine (LANTUS) injection 10 Units  10 Units Subcutaneous QHS Cherene Altes, MD   10 Units at 10/05/11 0023  . DISCONTD: insulin glargine (LANTUS) injection 20 Units  20 Units Subcutaneous QHS Charlynne Cousins, MD      . DISCONTD: insulin glargine (LANTUS) injection 5 Units  5  Units Subcutaneous QHS Charlynne Cousins, MD   5 Units at 09/29/11 2206  . DISCONTD: labetalol (NORMODYNE,TRANDATE) 4 mg/mL in dextrose 5 % 125 mL infusion  0.5-3 mg/min Intravenous Titrated Charlynne Cousins, MD      . DISCONTD: lidocaine (XYLOCAINE) 1 % injection 5 mL  5 mL Intradermal PRN Sol Blazing, MD      . DISCONTD: lidocaine (XYLOCAINE) 1 % injection    PRN Angelia Mould, MD   19 mL at 10/04/11 1151  . DISCONTD: lidocaine (  XYLOCAINE) 1 % injection    PRN Elam Dutch, MD   30 mL at 10/04/11 2023  . DISCONTD: lidocaine-EPINEPHrine (XYLOCAINE-EPINEPHrine) 1 %-1:200000 (with pres) injection    PRN Angelia Mould, MD   30 mL at 10/04/11 1105  . DISCONTD: lidocaine-prilocaine (EMLA) cream 1 application  1 application Topical PRN Sol Blazing, MD      . DISCONTD: loperamide (IMODIUM) capsule 2 mg  2 mg Oral PRN Cherene Altes, MD      . DISCONTD: methylPREDNISolone sodium succinate (SOLU-MEDROL) 125 mg/2 mL injection 125 mg  125 mg Intravenous Q12H Charlynne Cousins, MD   125 mg at 09/28/11 1310  . DISCONTD: methylPREDNISolone sodium succinate (SOLU-MEDROL) 40 mg/mL injection 40 mg  40 mg Intravenous Q12H Charlynne Cousins, MD   40 mg at 09/27/11 2020  . DISCONTD: methylPREDNISolone sodium succinate (SOLU-MEDROL) 40 mg/mL injection 40 mg  40 mg Intravenous Q12H Charlynne Cousins, MD   40 mg at 09/29/11 2207  . DISCONTD: midazolam (VERSED) injection 1-2 mg  1-2 mg Intravenous PRN Napoleon Form, MD      . DISCONTD: midazolam (VERSED) injection 1-2 mg  1-2 mg Intravenous PRN Leda Quail, MD      . DISCONTD: morphine 4 MG/ML injection 2.96 mg  0.05 mg/kg Intravenous Q10 min PRN Finis Bud, MD      . DISCONTD: multivitamin (RENA-VIT) tablet 1 tablet  1 tablet Oral Daily Sol Blazing, MD   1 tablet at 10/04/11 1453  . DISCONTD: nebivolol (BYSTOLIC) tablet 20 mg  20 mg Oral Daily Charlynne Cousins, MD   20 mg at 09/27/11 2137  . DISCONTD: nebivolol  (BYSTOLIC) tablet 20 mg  20 mg Oral Daily Charlynne Cousins, MD   20 mg at 10/04/11 1452  . DISCONTD: Nebivolol HCl TABS 20 mg  20 mg Oral Daily Charlynne Cousins, MD      . DISCONTD: Nebivolol HCl TABS 20 mg  20 mg Oral Daily Charlynne Cousins, MD      . DISCONTD: oxyCODONE (Oxy IR/ROXICODONE) immediate release tablet 5 mg  5 mg Oral Q6H PRN Lindell Spar, PA   5 mg at 10/04/11 1507  . DISCONTD: pentafluoroprop-tetrafluoroeth (GEBAUERS) aerosol 1 application  1 application Topical PRN Sol Blazing, MD      . DISCONTD: sodium chloride 0.9 % injection 3 mL  3 mL Intravenous Q12H Charlynne Cousins, MD   3 mL at 09/29/11 1329  . DISCONTD: sodium chloride 0.9 % injection 3 mL  3 mL Intravenous PRN Charlynne Cousins, MD      . DISCONTD: Surgifoam 100 with Thrombin 20,000 units (20 ml) topical solution    PRN Angelia Mould, MD      . DISCONTD: torsemide [facility]) tablet 40 mg  40 mg Oral Daily Charlynne Cousins, MD      . DISCONTD: torsemide [facility]) tablet 40 mg  40 mg Oral Daily Ritta Slot, NP   40 mg at 09/27/11 2136   Medications Prior to Admission  Medication Sig Dispense Refill  . cloNIDine (CATAPRES) 0.2 MG tablet Take 1 tablet (0.2 mg total) by mouth 3 (three) times daily.  90 tablet  0  . hydrALAZINE (APRESOLINE) 50 MG tablet Take 1 tablet (50 mg total) by mouth 4 (four) times daily.  120 tablet  0  . insulin glargine (LANTUS) 100 UNIT/ML injection Inject 10 Units into the skin at bedtime.  10 mL  0  .  levofloxacin (LEVAQUIN) 500 MG tablet Take 0.5-1 tablets (250-500 mg total) by mouth every other day. 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 [employer] Functional Status:  Mobility: Bed Mobility Bed Mobility: Rolling Right;Right Sidelying to Sit Rolling Right: 5: Supervision;With rail Right Sidelying to Sit: 3: Mod assist;HOB flat Transfers Transfers: Sit to Stand;Stand to Sit Sit to Stand: 3: Mod assist;With upper extremity assist;From bed;4: Min assist;From chair/3-in-1 (from bed and also 5 reps for strengthening and activity tol) Stand to Sit: 3: Mod assist;4: Min assist;With upper extremity assist;To chair/3-in-1 Ambulation/Gait Ambulation/Gait Assistance: 4: Min assist (second person present for safety) Ambulation Distance (Feet): 4 Feet Assistive device: 2 person hand held assist Ambulation/Gait Assistance Details: short step length, pivot steps bed to chair; cues for posture    ADL:    Cognition: Cognition Arousal/Alertness: Awake/alert Orientation Level: Oriented X4 Cognition Overall Cognitive Status: Appears within functional limits for tasks assessed/performed Arousal/Alertness: Awake/alert Orientation Level: Appears intact for tasks assessed Behavior During Session: [facility] for tasks performed  Blood pressure 145/58, pulse 74, temperature 97.7 F (36.5 C), temperature source Oral, resp. rate 18, height 5\' 2"  (1.575 m), weight 61 kg (134 lb 7.7 oz), last menstrual period 09/16/2011, SpO2 100.00%. Physical Exam  Vitals reviewed. Constitutional: She is oriented to person, place, and time. She appears well-developed.  HENT:  Head: Normocephalic.  Neck: Normal range of motion. No thyromegaly present.  Cardiovascular: Regular rhythm.   Pulmonary/Chest: She has no wheezes.  Abdominal: She exhibits  no distension. There is no tenderness.  Neurological: She is alert and oriented to person, place, and time.  Skin: Skin is warm and dry.  Psychiatric: She has a normal mood and affect.   motor strength is 4/5 in bilateral hip flexors knee extensors ankle dorsiflexors, 5/5 in bilateral deltoid, biceps, triceps, grip Sensation is intact to light touch bilaterally in the feet. Extremities show no evidence of clubbing cyanosis or edema  Results for orders placed during the hospital encounter of 09/27/11 (from the past 24 hour(s))  GLUCOSE, CAPILLARY     Status: Abnormal   Collection Time   10/06/11  8:01 AM      Component Value Range   Glucose-Capillary 53 (*) 70 - 99 (mg/dL)  GLUCOSE, CAPILLARY     Status: Abnormal   Collection Time   10/06/11  8:29 AM      Component Value Range   Glucose-Capillary 101 (*) 70 - 99 (mg/dL)  GLUCOSE, CAPILLARY     Status: Abnormal   Collection Time   10/06/11 12:05 PM      Component Value Range   Glucose-Capillary 100 (*) 70 - 99 (mg/dL)  GLUCOSE, CAPILLARY     Status: Normal   Collection Time   10/06/11  5:01 PM      Component Value Range   Glucose-Capillary 87  70 - 99 (mg/dL)  GLUCOSE, CAPILLARY     Status: Normal   Collection Time  10/06/11 11:01 PM      Component Value Range   Glucose-Capillary 79  70 - 99 (mg/dL)   Comment 1 Notify RN     No results found.  Assessment/Plan: Diagnosis: Deconditioning after pneumonia in a patient with severe chronic kidney disease 1. Does the need for close, 24 hr/day medical supervision in concert with the patient's rehab needs make it unreasonable for this patient to be served in a less intensive setting? Yes 2. Co-Morbidities requiring supervision/potential complications: Hypertension, diabetes, recent pneumonia, gout 3. Due to bowel management, safety, skin/wound care, disease management, medication administration and pain management, does the patient require 24 hr/day rehab nursing? Yes 4. Does the patient  require coordinated care of a physician, rehab nurse, PT (1-2 hrs/day, 5 days/week) and OT (1-2 hrs/day, 5 days/week) to address physical and functional deficits in the context of the above medical diagnosis(es)? Yes Addressing deficits in the following areas: endurance, locomotion, strength, transferring, bathing, dressing and toileting 5. Can the patient actively participate in an intensive therapy program of at least 3 hrs of therapy per day at least 5 days per week? Yes 6. The potential for patient to make measurable gains while on inpatient rehab is excellent 7. Anticipated functional outcomes upon discharge from inpatient rehab are supervision to modified independent mobility with PT, supervision to modified independent ADLs with OT, not applicable with SLP. 8. Estimated rehab length of stay to reach the above functional goals is: 10 days 9. Does the patient have adequate social supports to accommodate these discharge functional goals? Yes 10. Anticipated D/C setting: Home 11. Anticipated post D/C treatments: [place] therapy 12. Overall Rehab/Functional Prognosis: excellent  RECOMMENDATIONS: This patient's condition is appropriate for continued rehabilitative care in the following setting: CIR Patient has agreed to participate in recommended program. Yes Note that insurance prior authorization may be required for reimbursement for recommended care.  Comment:   ANGIULLI,DANIEL J. 10/07/2011

## 2011-10-07 NOTE — Progress Notes (Signed)
[facility] ROUNDING NOTE   Subjective:   Interval History: none.  Objective:  Vital signs in last 24 hours:  Temp:  [97.3 F (36.3 C)-98.7 F (37.1 C)] 97.7 F (36.5 C) (04/22 0515) Pulse Rate:  [71-76] 74  (04/22 0515) Resp:  [18-19] 18  (04/22 0515) BP: (127-155)/(56-76) 145/58 mmHg (04/22 0515) SpO2:  [97 %-100 %] 100 % (04/22 0515) Weight:  [61 kg (134 lb 7.7 oz)] 61 kg (134 lb 7.7 oz) (04/21 2202)  Weight change: -0.2 kg (-7.1 oz) Filed Weights   10/05/11 1138 10/05/11 2118 10/06/11 2202  Weight: 59.9 kg (132 lb 0.9 oz) 60 kg (132 lb 4.4 oz) 61 kg (134 lb 7.7 oz)    Intake/Output: I/O last 3 completed shifts: In: 480 [P.O.:480] Out: 175 [Urine:175]   Intake/Output this shift:     CVS- RRR RS- CTA ABD- BS present soft non-distended EXT- no edema   Basic Metabolic Panel:  Lab 0000000 0605 10/04/11 0807 10/03/11 0631 10/02/11 0530 09/30/11 1230  NA 137 136 134* 136 138  K 3.8 4.6 3.6 3.6 3.9  CL 99 99 99 99 100  CO2 29 27 23 24 26   GLUCOSE 60* 90 76 103* 164*  BUN 19 23 60* 50* 43*  CREATININE 4.41* 3.76* 5.64* 4.98* 4.62*  CALCIUM 8.6 8.5 8.4 -- --  MG -- -- -- -- --  PHOS 4.7* -- 4.9* 4.2 3.9    Liver Function Tests:  Lab 10/07/11 0605 10/03/11 0631 10/02/11 0530 09/30/11 1230  AST -- -- -- --  ALT -- -- -- --  ALKPHOS -- -- -- --  BILITOT -- -- -- --  PROT -- -- -- --  ALBUMIN 2.1* 2.2* 2.2* 2.6*   No results found for this basename: LIPASE:5,AMYLASE:5 in the last 168 hours No results found for this basename: AMMONIA:3 in the last 168 hours  CBC:  Lab 10/07/11 0605 10/04/11 0807 10/03/11 0630 10/02/11 0530 09/30/11 1230  WBC 9.5 10.7* 10.9* 11.2* 9.0  NEUTROABS -- -- -- -- --  HGB 7.7* 9.3* 9.3* 9.7* 9.2*  HCT 23.9* 28.5* 28.4* 29.4* 28.0*  MCV 81.6 79.6 78.7 79.0 79.5  PLT 307 241 205 153 129*    Cardiac Enzymes: No results found for this basename: CKTOTAL:5,CKMB:5,CKMBINDEX:5,TROPONINI:5 in the last 168  hours  BNP: No components found with this basename: POCBNP:5  CBG:  Lab 10/07/11 0742 10/06/11 2301 10/06/11 1701 10/06/11 1205 10/06/11 0829  GLUCAP 59* 79 87 100* 101*    Microbiology: Results for orders placed during the hospital encounter of 09/27/11  CULTURE, BLOOD (ROUTINE X 2)     Status: Normal   Collection Time   09/27/11  4:17 PM      Component Value Range Status Comment   Specimen Description BLOOD ARM RIGHT   Final    Special Requests BOTTLES DRAWN AEROBIC AND ANAEROBIC [facility]   Final    Culture  Setup Time [id number]   Final    Culture NO GROWTH 5 DAYS   Final    Report Status 10/04/2011 FINAL   Final   CULTURE, BLOOD (ROUTINE X 2)     Status: Normal   Collection Time   09/27/11  4:20 PM      Component Value Range Status Comment   Specimen Description BLOOD HAND LEFT   Final    Special Requests BOTTLES DRAWN AEROBIC AND ANAEROBIC [facility]   Final    Culture  Setup Time [id number]   Final  Culture NO GROWTH 5 DAYS   Final    Report Status 10/04/2011 FINAL   Final   CULTURE, SPUTUM-ASSESSMENT     Status: Normal   Collection Time   09/27/11  7:51 PM      Component Value Range Status Comment   Specimen Description SPUTUM   Final    Special Requests NONE   Final    Sputum evaluation     Final    Value: MICROSCOPIC FINDINGS SUGGEST THAT THIS SPECIMEN IS NOT REPRESENTATIVE OF LOWER RESPIRATORY SECRETIONS. PLEASE RECOLLECT.     CALLED TO B. [facility] RN 4.12.13 AT 2230 BY ROMEROJ   Report Status 09/27/2011 FINAL   Final   MRSA PCR SCREENING     Status: Normal   Collection Time   09/27/11 10:14 PM      Component Value Range Status Comment   MRSA by PCR NEGATIVE  NEGATIVE  Final   CLOSTRIDIUM DIFFICILE BY PCR     Status: Normal   Collection Time   10/01/11  2:17 AM      Component Value Range Status Comment   C difficile by pcr NEGATIVE  NEGATIVE  Final     Coagulation Studies: No results found for this basename: LABPROT:5,INR:5 in the last 72  hours  Urinalysis: No results found for this basename: COLORURINE:2,APPERANCEUR:2,LABSPEC:2,PHURINE:2,GLUCOSEU:2,HGBUR:2,BILIRUBINUR:2,KETONESUR:2,PROTEINUR:2,UROBILINOGEN:2,NITRITE:2,LEUKOCYTESUR:2 in the last 72 hours    Imaging: No results found.   Medications:      . sodium chloride        . amLODipine  10 mg Oral Daily  . cloNIDine  0.2 mg Oral TID  . hydrALAZINE  50 mg Oral QID  . insulin aspart  0-5 Units Subcutaneous QHS  . insulin aspart  0-9 Units Subcutaneous TID WC  . insulin glargine  10 Units Subcutaneous QHS  . levofloxacin  500 mg Oral Q48H  . multivitamin  1 tablet Oral Daily  . nebivolol  20 mg Oral Daily   HYDROmorphone, ondansetron (ZOFRAN) IV  Assessment/ Plan:  1. ESRD new start TTS 2. vasc access- appreciate VVS assistance.Steal syndrome will need plan as outpatient 3. Anemia of chronic disease- on EPO/iron  4. HTN- Amlodipine stable 5. Disposition [facility] TTS but very weak and not quite ready for discharge   LOS: 10 Linsay Vogt W @TODAY @8 :33 AM

## 2011-10-07 NOTE — Progress Notes (Signed)
Held Blood pressure meds for patient due to low BP. Orthostatic BP as low as 66/33. Dr. Melrose Nakayama notified. Was told to hold meds and recheck blood pressure often.

## 2011-10-07 NOTE — Progress Notes (Signed)
CBG: 59  Treatment: 15 GM carbohydrate snack  Symptoms: None  Follow-up CBG: Time: 0805 CBG Result:75  Possible Reasons for Event: Inadequate meal intake  Comments/MD notified:Dr. Melrose Nakayama text paged. Pt now eating breakfast.   Update: Dr. Melrose Nakayama visited patient.     SILK, Rolondo Pierre

## 2011-10-08 ENCOUNTER — Inpatient Hospital Stay (HOSPITAL_COMMUNITY)
Admission: RE | Admit: 2011-10-08 | Discharge: 2011-10-18 | DRG: 945 | Disposition: A | Discharge status: Home / Self Care | Payer: Medicaid Other | Source: Ambulatory Visit | Attending: Physical Medicine & Rehabilitation | Admitting: Physical Medicine & Rehabilitation

## 2011-10-08 ENCOUNTER — Encounter (HOSPITAL_COMMUNITY): Payer: Self-pay | Admitting: *Deleted

## 2011-10-08 DIAGNOSIS — E11319 Type 2 diabetes mellitus with unspecified diabetic retinopathy without macular edema: Secondary | ICD-10-CM | POA: Diagnosis present

## 2011-10-08 DIAGNOSIS — N186 End stage renal disease: Secondary | ICD-10-CM | POA: Diagnosis present

## 2011-10-08 DIAGNOSIS — N2581 Secondary hyperparathyroidism of renal origin: Secondary | ICD-10-CM | POA: Diagnosis present

## 2011-10-08 DIAGNOSIS — R5381 Other malaise: Secondary | ICD-10-CM

## 2011-10-08 DIAGNOSIS — D638 Anemia in other chronic diseases classified elsewhere: Secondary | ICD-10-CM | POA: Diagnosis present

## 2011-10-08 DIAGNOSIS — E1139 Type 2 diabetes mellitus with other diabetic ophthalmic complication: Secondary | ICD-10-CM | POA: Diagnosis present

## 2011-10-08 DIAGNOSIS — Z992 Dependence on renal dialysis: Secondary | ICD-10-CM

## 2011-10-08 DIAGNOSIS — Z5189 Encounter for other specified aftercare: Principal | ICD-10-CM

## 2011-10-08 DIAGNOSIS — N179 Acute kidney failure, unspecified: Secondary | ICD-10-CM | POA: Diagnosis present

## 2011-10-08 DIAGNOSIS — J189 Pneumonia, unspecified organism: Secondary | ICD-10-CM | POA: Diagnosis present

## 2011-10-08 DIAGNOSIS — I120 Hypertensive chronic kidney disease with stage 5 chronic kidney disease or end stage renal disease: Secondary | ICD-10-CM | POA: Diagnosis present

## 2011-10-08 DIAGNOSIS — Z7982 Long term (current) use of aspirin: Secondary | ICD-10-CM

## 2011-10-08 HISTORY — DX: Heart failure, unspecified: I50.9

## 2011-10-08 LAB — CBC
HCT: 23.3 % — ABNORMAL LOW (ref 36.0–46.0)
Hemoglobin: 7.5 g/dL — ABNORMAL LOW (ref 12.0–15.0)
MCH: 26.2 pg (ref 26.0–34.0)
MCHC: 32.2 g/dL (ref 30.0–36.0)
MCV: 81.5 fL (ref 78.0–100.0)
Platelets: 332 10*3/uL (ref 150–400)
RBC: 2.86 MIL/uL — ABNORMAL LOW (ref 3.87–5.11)
RDW: 15.1 % (ref 11.5–15.5)
WBC: 11.3 10*3/uL — ABNORMAL HIGH (ref 4.0–10.5)

## 2011-10-08 LAB — GLUCOSE, CAPILLARY
Glucose-Capillary: 104 mg/dL — ABNORMAL HIGH (ref 70–99)
Glucose-Capillary: 222 mg/dL — ABNORMAL HIGH (ref 70–99)
Glucose-Capillary: 91 mg/dL (ref 70–99)

## 2011-10-08 LAB — BASIC METABOLIC PANEL
BUN: 25 mg/dL — ABNORMAL HIGH (ref 6–23)
CO2: 29 mEq/L (ref 19–32)
Calcium: 8.6 mg/dL (ref 8.4–10.5)
Chloride: 98 mEq/L (ref 96–112)
Creatinine, Ser: 5.18 mg/dL — ABNORMAL HIGH (ref 0.50–1.10)
GFR calc Af Amer: 11 mL/min — ABNORMAL LOW (ref >90)
GFR calc non Af Amer: 9 mL/min — ABNORMAL LOW (ref >90)
Glucose, Bld: 86 mg/dL (ref 70–99)
Potassium: 3.8 mEq/L (ref 3.5–5.1)
Sodium: 135 mEq/L (ref 135–145)

## 2011-10-08 LAB — PHOSPHORUS: Phosphorus: 5.2 mg/dL — ABNORMAL HIGH (ref 2.3–4.6)

## 2011-10-08 LAB — ABO/RH: ABO/RH(D): A POS

## 2011-10-08 MED ORDER — INSULIN ASPART 100 UNIT/ML ~~LOC~~ SOLN: 0.0000 [IU] | Freq: Every day | SUBCUTANEOUS | Status: DC

## 2011-10-08 MED ORDER — INSULIN GLARGINE 100 UNIT/ML ~~LOC~~ SOLN: 5.0000 [IU] | Freq: Every day | SUBCUTANEOUS | Status: DC

## 2011-10-08 MED ORDER — BOOST / RESOURCE BREEZE PO LIQD: 1.0000 | Freq: Two times a day (BID) | ORAL | Status: DC

## 2011-10-08 MED ORDER — INSULIN GLARGINE 100 UNIT/ML ~~LOC~~ SOLN
5.0000 [IU] | Freq: Every day | SUBCUTANEOUS | Status: DC
  Administered 2011-10-08 – 2011-10-15 (×8): 5 [IU] via SUBCUTANEOUS

## 2011-10-08 MED ORDER — CLONIDINE HCL 0.2 MG PO TABS: 0.2000 mg | ORAL_TABLET | Freq: Three times a day (TID) | ORAL | Status: DC

## 2011-10-08 MED ORDER — ONDANSETRON HCL 4 MG/2ML IJ SOLN: 4.0000 mg | Freq: Four times a day (QID) | INTRAMUSCULAR | Status: DC | PRN

## 2011-10-08 MED ORDER — LEVOFLOXACIN 500 MG PO TABS
500.0000 mg | ORAL_TABLET | ORAL | Status: AC
  Administered 2011-10-09 – 2011-10-15 (×4): 500 mg via ORAL
  Filled 2011-10-08 (×4): qty 1

## 2011-10-08 MED ORDER — HYDRALAZINE HCL 50 MG PO TABS: 50.0000 mg | ORAL_TABLET | Freq: Four times a day (QID) | ORAL | Status: DC

## 2011-10-08 MED ORDER — POLYETHYLENE GLYCOL 3350 17 G PO PACK
17.0000 g | PACK | Freq: Every day | ORAL | Status: DC | PRN
  Filled 2011-10-08: qty 1

## 2011-10-08 MED ORDER — SORBITOL 70 % SOLN
30.0000 mL | Freq: Every day | Status: DC | PRN
  Administered 2011-10-08 – 2011-10-09 (×2): 30 mL via ORAL
  Filled 2011-10-08 (×2): qty 30

## 2011-10-08 MED ORDER — RENA-VITE PO TABS: 1.0000 | ORAL_TABLET | Freq: Every day | ORAL | Status: DC

## 2011-10-08 MED ORDER — CLONIDINE HCL 0.2 MG PO TABS
0.2000 mg | ORAL_TABLET | Freq: Three times a day (TID) | ORAL | Status: DC
  Administered 2011-10-08 – 2011-10-10 (×5): 0.2 mg via ORAL
  Filled 2011-10-08 (×11): qty 1

## 2011-10-08 MED ORDER — NEBIVOLOL HCL 10 MG PO TABS
20.0000 mg | ORAL_TABLET | Freq: Every day | ORAL | Status: DC
  Administered 2011-10-09 – 2011-10-17 (×9): 20 mg via ORAL
  Filled 2011-10-08 (×12): qty 2

## 2011-10-08 MED ORDER — ACETAMINOPHEN 325 MG PO TABS
325.0000 mg | ORAL_TABLET | ORAL | Status: DC | PRN
  Filled 2011-10-08: qty 2

## 2011-10-08 MED ORDER — NEBIVOLOL HCL 20 MG PO TABS: 20.0000 mg | ORAL_TABLET | Freq: Every day | ORAL | Status: DC

## 2011-10-08 MED ORDER — BOOST / RESOURCE BREEZE PO LIQD
1.0000 | Freq: Two times a day (BID) | ORAL | Status: DC
  Administered 2011-10-08: 1 via ORAL

## 2011-10-08 MED ORDER — RENA-VITE PO TABS
1.0000 | ORAL_TABLET | Freq: Every day | ORAL | Status: DC
  Administered 2011-10-08 – 2011-10-17 (×10): 1 via ORAL
  Filled 2011-10-08 (×11): qty 1

## 2011-10-08 MED ORDER — INSULIN ASPART 100 UNIT/ML ~~LOC~~ SOLN: 0.0000 [IU] | Freq: Three times a day (TID) | SUBCUTANEOUS | Status: DC

## 2011-10-08 MED ORDER — HYDRALAZINE HCL 50 MG PO TABS
50.0000 mg | ORAL_TABLET | Freq: Four times a day (QID) | ORAL | Status: DC
  Administered 2011-10-09 – 2011-10-13 (×11): 50 mg via ORAL
  Filled 2011-10-08 (×22): qty 1

## 2011-10-08 MED ORDER — OXYCODONE HCL 5 MG PO TABS
5.0000 mg | ORAL_TABLET | ORAL | Status: DC | PRN
  Administered 2011-10-10 – 2011-10-16 (×2): 10 mg via ORAL
  Filled 2011-10-08: qty 1
  Filled 2011-10-08 (×2): qty 2

## 2011-10-08 NOTE — Progress Notes (Signed)
Pt discharged to inpatient rehab. IV removed. No barriers to discharge noted. Assessment unchanged from morning.

## 2011-10-08 NOTE — Progress Notes (Signed)
We plan to admit pt to inpt rehab today. Patient is in agreement. I have discussed with Dr. Melrose Nakayama. [phone] for questions.

## 2011-10-08 NOTE — Procedures (Signed)
I have seen and examined this patient and agree with the plan of care seen on dialysis without complications. May transfuse two units  Demetries Coia W 10/08/2011, 8:23 AM

## 2011-10-08 NOTE — Progress Notes (Signed)
INITIAL ADULT NUTRITION ASSESSMENT Date: 10/08/2011   Time: 8:43 AM  Reason for Assessment: MD Consult for Education  ASSESSMENT: Female 41 y.o.  Dx: Acute respiratory failure with hypoxia  Hx:  Past Medical History  Diagnosis Date  . Chronic kidney disease   . Hypertension   . Diabetes mellitus   . Retinopathy   . Metabolic bone disease   . Anemia    Past Surgical History  Procedure Date  . Insertion of dialysis catheter 10/04/2011    Procedure: INSERTION OF DIALYSIS CATHETER;  Surgeon: Angelia Mould, MD;  Location: [place];  Service: Vascular;  Laterality: Right;  insertion of dialysis catheter right internal jugular  . Av fistula placement 10/04/2011    Procedure: INSERTION OF ARTERIOVENOUS (AV) GORE-TEX GRAFT ARM;  Surgeon: Angelia Mould, MD;  Location: [place];  Service: Vascular;  Laterality: Left;  Insertion left upper arm Arteriovenous goretex graft  . Avgg removal 10/04/2011    Procedure: REMOVAL OF ARTERIOVENOUS GORETEX GRAFT ([place]);  Surgeon: Elam Dutch, MD;  Location: [facility] OR;  Service: Vascular;  Laterality: Left;   Related Meds:     . cloNIDine  0.2 mg Oral TID  . hydrALAZINE  50 mg Oral QID  . insulin aspart  0-5 Units Subcutaneous QHS  . insulin aspart  0-9 Units Subcutaneous TID WC  . insulin glargine  5 Units Subcutaneous QHS  . kidney failure book   Does not apply Once  . levofloxacin  500 mg Oral Q48H  . multivitamin  1 tablet Oral Daily  . nebivolol  20 mg Oral Daily  . DISCONTD: amLODipine  10 mg Oral Daily  . DISCONTD: insulin glargine  10 Units Subcutaneous QHS  . DISCONTD: nebivolol  20 mg Oral Daily   Ht: 5\' 2"  (157.5 cm)  Wt: 135 lb 2.3 oz (61.3 kg)  Ideal Wt: 50 kg % Ideal Wt: 123%  Wt Readings from Last 15 Encounters:  10/08/11 135 lb 2.3 oz (61.3 kg)  10/08/11 135 lb 2.3 oz (61.3 kg)  10/08/11 135 lb 2.3 oz (61.3 kg)  12/25/09 158 lb 14.4 oz (72.077 kg)  12/06/09 156 lb 11.2 oz (71.079 kg)  11/29/09 159 lb (72.122  kg)  09/22/09 163 lb (73.936 kg)  04/28/09 161 lb 11.2 oz (73.347 kg)  03/31/09 163 lb (73.936 kg)  02/13/09 165 lb 6.4 oz (75.025 kg)  01/11/09 168 lb 8 oz (76.431 kg)  12/21/08 170 lb (77.111 kg)  09/02/07 181 lb (82.101 kg)  08/06/07 180 lb 8 oz (81.874 kg)  01/02/07 179 lb (81.194 kg)  Usual Wt: 160 lb % Usual Wt: 84%  Body mass index is 24.72 kg/(m^2). Weight is WNL.  Food/Nutrition Related Hx: highly fluctuating PO intake  Labs:  CMP     Component Value Date/Time   NA 137 10/07/2011 0605   K 3.8 10/07/2011 0605   CL 99 10/07/2011 0605   CO2 29 10/07/2011 0605   GLUCOSE 60* 10/07/2011 0605   GLUCOSE 318 03/31/2009 0815   BUN 19 10/07/2011 0605   CREATININE 4.41* 10/07/2011 0605   CALCIUM 8.6 10/07/2011 0605   CALCIUM 8.5 04/02/2011 0514   PROT 6.7 09/27/2011 1926   ALBUMIN 2.1* 10/07/2011 0605   AST 21 09/27/2011 1926   ALT 10 09/27/2011 1926   ALKPHOS 58 09/27/2011 1926   BILITOT 0.2* 09/27/2011 1926   GFRNONAA 11* 10/07/2011 0605   GFRAA 13* 10/07/2011 0605   CBG (last 3)   Basename 10/07/11 2135 10/07/11 1632  10/07/11 1130  GLUCAP 118* 120* 136*   Lab Results  Component Value Date   HGBA1C 6.1* 09/27/2011    Intake/Output Summary (Last 24 hours) at 10/08/11 0845 Last data filed at 10/08/11 0656  Gross per 24 hour  Intake    360 ml  Output      0 ml  Net    360 ml   Diet Order: Renal 80/90  Supplements/Tube Feeding: none  IVF:    DISCONTD: sodium chloride   Estimated Nutritional Needs:   Kcal: 1830 - 10/07/28 kcal Protein:  73 - 85 grams Fluid:  1.2 L/d  Past medical hx significant for CKD Stage IV and DM.  Temporary HD cath placed 4/13, acute HD on 4/13. L AV graft placed 4/19, but developed ischemic steal and required removal. Will need access as outpt.  Pt planning to go to CIR later today.  RD consulted for HD education. Reviewed Choose-A-Meal Booklet with patient.  Pt denotes slow and steady weight loss since 2007-10-08. Pt attributes wt decline to poor  intake 2/2 brother's death. Pt states intake remains highly variable.  NUTRITION DIAGNOSIS: 1. Inadequate oral intake r/t limited appetite AEB poor meal completion <50% of meals.  2. Food- and nutrition-related knowledge deficit r/t limited prior diet education AEB pt report and need for education. Resolved.  MONITORING/EVALUATION(Goals): Goal: 1. Pt to consume >/= 75% of meals and supplements. 2. Verbalize basic understanding of Renal diet. Met.  EDUCATION NEEDS: -Education needs addressed  INTERVENTION: 1. RD provided diet education via Choose-A-Meal booklet. Reviewed specific information for pt's needs. 2. Resource Breeze PO BID for additional protein and kcal 3. RD to continue to follow  Dietitian #: [id number] 10/07/2644  [place] Per approved criteria  -Not Applicable    Asencion Partridge 10/08/2011, 8:43 AM

## 2011-10-08 NOTE — Progress Notes (Signed)
[facility] ROUNDING NOTE   Subjective:   Interval History: none.  Objective:  Vital signs in last 24 hours:  Temp:  [97.5 F (36.4 C)-98.8 F (37.1 C)] 98.4 F (36.9 C) (04/23 0712) Pulse Rate:  [70-79] 74  (04/23 0800) Resp:  [15-18] 16  (04/23 0800) BP: (66-194)/(33-85) 151/73 mmHg (04/23 0800) SpO2:  [91 %-100 %] 91 % (04/23 0712) Weight:  [61.2 kg (134 lb 14.7 oz)-61.3 kg (135 lb 2.3 oz)] 61.3 kg (135 lb 2.3 oz) (04/23 [id number])  Weight change: 0.2 kg (7.1 oz) Filed Weights   10/06/11 2202 10/07/11 2209 10/08/11 0712  Weight: 61 kg (134 lb 7.7 oz) 61.2 kg (134 lb 14.7 oz) 61.3 kg (135 lb 2.3 oz)    Intake/Output: I/O last 3 completed shifts: In: 600 [P.O.:600] Out: -    Intake/Output this shift:     CVS- RRR RS- CTA ABD- BS present soft non-distended EXT- no edema   Basic Metabolic Panel:  Lab 0000000 0605 10/04/11 0807 10/03/11 0631 10/02/11 0530  NA 137 136 134* 136  K 3.8 4.6 3.6 3.6  CL 99 99 99 99  CO2 29 27 23 24   GLUCOSE 60* 90 76 103*  BUN 19 23 60* 50*  CREATININE 4.41* 3.76* 5.64* 4.98*  CALCIUM 8.6 8.5 8.4 --  MG -- -- -- --  PHOS 4.7* -- 4.9* 4.2    Liver Function Tests:  Lab 10/07/11 0605 10/03/11 0631 10/02/11 0530  AST -- -- --  ALT -- -- --  ALKPHOS -- -- --  BILITOT -- -- --  PROT -- -- --  ALBUMIN 2.1* 2.2* 2.2*   No results found for this basename: LIPASE:5,AMYLASE:5 in the last 168 hours No results found for this basename: AMMONIA:3 in the last 168 hours  CBC:  Lab 10/08/11 0807 10/07/11 0605 10/04/11 0807 10/03/11 0630 10/02/11 0530  WBC 11.3* 9.5 10.7* 10.9* 11.2*  NEUTROABS -- -- -- -- --  HGB 7.5* 7.7* 9.3* 9.3* 9.7*  HCT 23.3* 23.9* 28.5* 28.4* 29.4*  MCV 81.5 81.6 79.6 78.7 79.0  PLT 332 307 241 205 153    Cardiac Enzymes: No results found for this basename: CKTOTAL:5,CKMB:5,CKMBINDEX:5,TROPONINI:5 in the last 168 hours  BNP: No components found with this basename: POCBNP:5  CBG:  Lab  10/07/11 2135 10/07/11 1632 10/07/11 1130 10/07/11 0805 10/07/11 0742  GLUCAP 118* 120* 136* 75 13*    Microbiology: Results for orders placed during the hospital encounter of 09/27/11  CULTURE, BLOOD (ROUTINE X 2)     Status: Normal   Collection Time   09/27/11  4:17 PM      Component Value Range Status Comment   Specimen Description BLOOD ARM RIGHT   Final    Special Requests BOTTLES DRAWN AEROBIC AND ANAEROBIC [facility]   Final    Culture  Setup Time [id number]   Final    Culture NO GROWTH 5 DAYS   Final    Report Status 10/04/2011 FINAL   Final   CULTURE, BLOOD (ROUTINE X 2)     Status: Normal   Collection Time   09/27/11  4:20 PM      Component Value Range Status Comment   Specimen Description BLOOD HAND LEFT   Final    Special Requests BOTTLES DRAWN AEROBIC AND ANAEROBIC [facility]   Final    Culture  Setup Time [id number]   Final    Culture NO GROWTH 5 DAYS   Final    Report Status 10/04/2011  FINAL   Final   CULTURE, SPUTUM-ASSESSMENT     Status: Normal   Collection Time   09/27/11  7:51 PM      Component Value Range Status Comment   Specimen Description SPUTUM   Final    Special Requests NONE   Final    Sputum evaluation     Final    Value: MICROSCOPIC FINDINGS SUGGEST THAT THIS SPECIMEN IS NOT REPRESENTATIVE OF LOWER RESPIRATORY SECRETIONS. PLEASE RECOLLECT.     CALLED TO B. [facility] RN 4.12.13 AT 2230 BY ROMEROJ   Report Status 09/27/2011 FINAL   Final   MRSA PCR SCREENING     Status: Normal   Collection Time   09/27/11 10:14 PM      Component Value Range Status Comment   MRSA by PCR NEGATIVE  NEGATIVE  Final   CLOSTRIDIUM DIFFICILE BY PCR     Status: Normal   Collection Time   10/01/11  2:17 AM      Component Value Range Status Comment   C difficile by pcr NEGATIVE  NEGATIVE  Final     Coagulation Studies: No results found for this basename: LABPROT:5,INR:5 in the last 72 hours  Urinalysis: No results found for this basename:  COLORURINE:2,APPERANCEUR:2,LABSPEC:2,PHURINE:2,GLUCOSEU:2,HGBUR:2,BILIRUBINUR:2,KETONESUR:2,PROTEINUR:2,UROBILINOGEN:2,NITRITE:2,LEUKOCYTESUR:2 in the last 72 hours    Imaging: No results found.   Medications:      . DISCONTD: sodium chloride        . cloNIDine  0.2 mg Oral TID  . hydrALAZINE  50 mg Oral QID  . insulin aspart  0-5 Units Subcutaneous QHS  . insulin aspart  0-9 Units Subcutaneous TID WC  . insulin glargine  5 Units Subcutaneous QHS  . kidney failure book   Does not apply Once  . levofloxacin  500 mg Oral Q48H  . multivitamin  1 tablet Oral Daily  . nebivolol  20 mg Oral Daily  . DISCONTD: amLODipine  10 mg Oral Daily  . DISCONTD: insulin glargine  10 Units Subcutaneous QHS  . DISCONTD: nebivolol  20 mg Oral Daily   ondansetron (ZOFRAN) IV, DISCONTD: HYDROmorphone  Assessment/ Plan:  1. ESRD new start TTS 2. vasc access- appreciate VVS assistance.Steal syndrome will need plan as outpatient 3. Anemia of chronic disease- on EPO/iron drop today, contacted primary team 4. HTN- Amlodipine stable 5. Disposition [facility] TTS but very weak and not quite ready for discharge   LOS: 11 Takeru Bose W @TODAY @8 :21 AM

## 2011-10-08 NOTE — H&P (Signed)
Physical Medicine and Rehabilitation Admission H&P    No chief complaint on file. : HPI: 41 year old right-handed African American female with history of chronic renal disease with creatinine of 5-6 and followed by [facility] currently not on dialysis . Admitted April 12 with productive cough and shortness of breath that progressively worsened over a four-day period. She was placed on CPAP due to oxygen saturation in the 80s in the emergency department. Chest x-ray revealed bilateral lower lobe and early right upper lobe pneumonia. Placed on broad-spectrum antibiotics. Renal service was consulted she was ultimately placed on dialysis therapy. Patient had undergone an AVG per Dr. Scot Dock but unfortunately developed ischemic steal syndrome and required removal of AV graft 4/19 per Dr. Oneida Alar. Plan is to be further evaluated by vascular surgery for future access placement. Her latest creatinine is 3.76 on April 19. Her respiratory status continues to improve antibiotics have been changed to Levaquin to complete a 7 day course. Noted severe deconditioning related to multi-medical issues as well as her recent respiratory status. Physical therapy recommends physical medicine rehabilitation consult to consider inpatient rehabilitation services  Review of Systems  Constitutional: Positive for chills and malaise/fatigue.  Respiratory: Positive for sputum production and shortness of breath.  Musculoskeletal: Positive for joint pain.  All other systems reviewed and are negative   Past Medical History  Diagnosis Date  . Chronic kidney disease   . Hypertension   . Diabetes mellitus   . Retinopathy   . Metabolic bone disease   . Anemia    Past Surgical History  Procedure Date  . Insertion of dialysis catheter 10/04/2011    Procedure: INSERTION OF DIALYSIS CATHETER;  Surgeon: Angelia Mould, MD;  Location: [place];  Service: Vascular;  Laterality: Right;  insertion of dialysis catheter  right internal jugular  . Av fistula placement 10/04/2011    Procedure: INSERTION OF ARTERIOVENOUS (AV) GORE-TEX GRAFT ARM;  Surgeon: Angelia Mould, MD;  Location: [place];  Service: Vascular;  Laterality: Left;  Insertion left upper arm Arteriovenous goretex graft  . Avgg removal 10/04/2011    Procedure: REMOVAL OF ARTERIOVENOUS GORETEX GRAFT ([place]);  Surgeon: Elam Dutch, MD;  [facility] OR;  Service: Vascular;  Laterality: Left;   Family History  Problem Relation Age of Onset  . Malignant hyperthermia Mother   . Malignant hyperthermia Father    Social History:  reports that she has never smoked. She does not have any smokeless tobacco history on file. She reports that she does not drink alcohol or use illicit drugs. Allergies: No Known Allergies Medications Prior to Admission  Medication Sig Dispense Refill  . aspirin EC 81 MG tablet Take 81 mg by mouth daily.      . cloNIDine (CATAPRES) 0.2 MG tablet Take 1 tablet (0.2 mg total) by mouth 3 (three) times daily.  90 tablet  0  . feeding supplement (RESOURCE BREEZE) LIQD Take 1 Container by mouth 2 (two) times daily between meals.  60 Container  0  . hydrALAZINE (APRESOLINE) 50 MG tablet Take 1 tablet (50 mg total) by mouth 4 (four) times daily.  100 tablet  0  . insulin aspart (NOVOLOG) 100 UNIT/ML injection Inject 0-5 Units into the skin at bedtime.  1 vial  0  . insulin aspart (NOVOLOG) 100 UNIT/ML injection Inject 0-9 Units into the skin 3 (three) times daily with meals.  1 vial  0  . insulin glargine (LANTUS) 100 UNIT/ML injection Inject 5 Units into the skin at  bedtime.  10 mL  0  . multivitamin (RENA-VIT) TABS tablet Take 1 tablet by mouth daily.  30 tablet  0  . Nebivolol HCl (BYSTOLIC) 20 MG TABS Take 20 mg by mouth daily.        Home:     Functional History:    Functional Status:  Mobility:          ADL:    Cognition:       Blood pressure 130/80, pulse 81, temperature 98.2 F (36.8 C), temperature  source Oral, resp. rate 18, height 5\' 2"  (1.575 m), last menstrual period 09/16/2011, SpO2 96.00%. Physical Exam  Vitals reviewed.  Constitutional: She is oriented to person, place, and time. She appears well-developed.  HENT:  Head: Normocephalic.  Neck: Normal range of motion. No thyromegaly present.  Cardiovascular: Regular rhythm.  Pulmonary/Chest: She has no wheezes.  Abdominal: She exhibits no distension. There is no tenderness.  Neurological: She is alert and oriented to person, place, and time.  Skin: Skin is warm and dry.  Psychiatric: She has a normal mood and affect.  motor strength is 4/5 in bilateral hip flexors knee extensors ankle dorsiflexors, 5/5 in bilateral deltoid, biceps, triceps, grip  Sensation is intact to light touch bilaterally in the feet.  Extremities show no evidence of clubbing cyanosis or edema   Results for orders placed during the hospital encounter of 10/08/11 (from the past 48 hour(s))  GLUCOSE, CAPILLARY     Status: Abnormal   Collection Time   10/08/11  4:09 PM      Component Value Range Comment   Glucose-Capillary 222 (*) 70 - 99 (mg/dL)    Comment 1 Notify RN      No results found.  Post Admission Physician Evaluation: 1. Functional deficits secondary  to deconditioning following pneumonia and acute on chronic renal failure. 2. Patient is admitted to receive collaborative, interdisciplinary care between the physiatrist, rehab nursing staff, and therapy team. 3. Patient's level of medical complexity and substantial therapy needs in context of that medical necessity cannot be provided at a lesser intensity of care such as a SNF. 4. Patient has experienced substantial functional loss from his/her baseline which was documented above under the "Functional History" and "Functional Status" headings.  Judging by the patient's diagnosis, physical exam, and functional history, the patient has potential for functional progress which will result in measurable  gains while on inpatient rehab.  These gains will be of substantial and practical use upon discharge  in facilitating mobility and self-care at the household level. 5. Physiatrist will provide 24 hour management of medical needs as well as oversight of the therapy plan/treatment and provide guidance as appropriate regarding the interaction of the two. 6. 24 hour rehab nursing will assist with bladder management, bowel management, safety, skin/wound care, disease management and medication administration  and help integrate therapy concepts, techniques,education, etc. 7. PT will assess and treat for:  Pre-gait training, gait training, safety, endurance, balance, equipment.  Goals are: Modified independent mobility. 8. OT will assess and treat for: ADLs, cognitive perceptual, safety, endurance, equipment.   Goals are: Modified independent ADLs. 9. SLP will assess and treat for: Not applicable.  Goals are: Not applicable. 10. Case Management and Social Worker will assess and treat for psychological issues and discharge planning. 11. Team conference will be held weekly to assess progress toward goals and to determine barriers to discharge. 12.  Patient will receive at least 3 hours of therapy per day at least 5 days  per week. 13. ELOS and Prognosis: 10 days excellent   Medical Problem List and Plan: 1. Deconditioning after pneumonia with severe chronic kidney disease 2. DVT Prophylaxis/Anticoagulation: SCDs. Monitor for any signs of deep vein thrombosis 3. ID/pneumonia. Complete Levaquin 10/15/11. Followup chest x-ray as needed. Check oxygen saturation every shift. 4. End-stage renal disease. Patient with dialysis initiated during this hospital stay. Weigh patient daily. Vascular surgery to followup for permanent access. Latest creatinine 4.41 10/07/2011 5. Hypertension. Catapres 0.2 mg 3 times a day, hydralazine 50 mg 4 times a day, bystolic 20 mg daily. Monitor with increased mobility. 6. Diabetes  mellitus. Hemoglobin A1c 6.1. Lantus insulin 5 units each bedtime. Check blood sugars a.c. and at bedtime 7. Chronic anemia hemoglobin 7.5-9.3. Followup labs with hemodialysis/transfused 2 units 10/08/2011   Alysia Penna E 10/08/2011, 5:29 PM

## 2011-10-08 NOTE — Progress Notes (Signed)
Called report to [facility] at Inpatient Rehab. Pt transferring to room 4149.

## 2011-10-08 NOTE — Discharge Summary (Signed)
Patient ID: [name] MRN: [id number] DOB/AGE: 01-20-1971 41 y.o.  Admit date: 09/27/2011 Discharge date: 10/08/2011  Primary Care Physician:  Robyn Haber, MD, MD   Discharge Diagnoses:    Present on Admission:  .Acute respiratory failure with hypoxia .DIABETES MELLITUS II, UNCOMPLICATED .HTN (hypertension), malignant . new end-stage renal disease  .PNA (pneumonia) *Steal syndrome Orthostatic Hypotention Hypoglycemia  Medication List  As of 10/08/2011 11:40 AM   STOP taking these medications         ferrous sulfate 325 (65 FE) MG tablet      insulin lispro protamine-insulin lispro (75-25) 100 UNIT/ML Susp      torsemide 20 MG tablet         TAKE these medications         aspirin EC 81 MG tablet   Take 81 mg by mouth daily.     cloNIDine 0.2 MG tablet   Commonly known as: CATAPRES   Take 1 tablet (0.2 mg total) by mouth 3 (three) times daily.         feeding supplement Liqd   Take 1 Container by mouth 2 (two) times daily between meals.     hydrALAZINE 50 MG tablet   Commonly known as: APRESOLINE   Take 1 tablet (50 mg total) by mouth 4 (four) times daily.     insulin aspart 100 UNIT/ML injection   Commonly known as: novoLOG   Inject 0-5 Units into the skin at bedtime.     insulin aspart 100 UNIT/ML injection   Commonly known as: novoLOG   Inject 0-9 Units into the skin 3 (three) times daily with meals.     insulin glargine 100 UNIT/ML injection   Commonly known as: LANTUS   Inject 5 Units into the skin at bedtime.     multivitamin Tabs tablet   Take 1 tablet by mouth daily.     BYSTOLIC 20 MG Tabs   Generic drug: Nebivolol HCl   Take 20 mg by mouth daily.                 Consults: Nephrology ,VVS   Significant Diagnostic Studies:  Dg Chest Port 1 View  10/04/2011  *RADIOLOGY REPORT*  Clinical Data: 41 year old female status post dialysis catheter placement.  PORTABLE CHEST - 1 VIEW  Comparison: 09/29/2011 and earlier.  Findings:  AP portable semi upright view 1244 hours.  New right IJ approach dual lumen tunneled catheter.  Catheter tips are at the level of the carina and cavoatrial junction.  No pneumothorax. Significantly improved ventilation and resolution of what was probably pulmonary edema.  Mild retrocardiac atelectasis remains. Stable cardiac size and mediastinal contours.  IMPRESSION: 1.  Right IJ dual lumen catheter placed with no adverse features. 2.  Resolved pulmonary edema.  Mild retrocardiac atelectasis.  Original Report Authenticated By: Randall An, M.D.   Dg Chest Port 1 View  09/29/2011  *RADIOLOGY REPORT*  Clinical Data: Follow up edema.  PORTABLE CHEST - 1 VIEW  Comparison: 09/28/2011  Findings: Chest radiograph demonstrates confluent airspace disease in the right lung.  Slightly improved aeration at the right lung base.  Again noted are the patchy airspace densities in the left mid lung.  Trachea is midline.  IMPRESSION: The distribution of the bilateral airspace disease has minimal changed, right side greater than left.  Slightly improved aeration at the right lung base.  Original Report Authenticated By: Markus Daft, M.D.   Dg Chest Port 1 View  09/28/2011  *RADIOLOGY REPORT*  Clinical Data:  Short of breath  PORTABLE CHEST - 1 VIEW  Comparison: Chest radiograph 10/27/2010  Findings: Stable enlarged heart silhouette.  There is increased air space disease in the right upper lobe and right lower lobe as well as the left lung to a lesser degree.  No pneumothorax.  Likely small effusions. Cardiomegaly.  The  IMPRESSION: Increasing bilateral air space disease more dense on the right consistent with worsening pulmonary edema or infection.  Sable cardiomegaly  Original Report Authenticated By: Suzy Bouchard, M.D.   Dg Chest Port 1 View  09/27/2011  *RADIOLOGY REPORT*  Clinical Data: 41 year old female with severe shortness of breath. History of CHF and diabetes.  PORTABLE CHEST - 1 VIEW  Comparison: 03/29/2011.   Findings: AP portable semi upright view 1500 hours. Stable cardiomegaly and mediastinal contours.  Confluent opacity at the right lung base.  Mildly increased retrocardiac opacity also, obscuring the left hemidiaphragm.  There is early airspace disease in the right upper lobe along the minor fissure.  The left upper lobe is clear.  No pneumothorax or large effusion. Visualized tracheal air column is within normal limits.  IMPRESSION: Bilateral lower lobe and early right upper lobe airspace disease is nonspecific but suspicious for severe pneumonia.  Original Report Authenticated By: Randall An, M.D.    Brief H and P: For complete details please refer to admission H and P, but in brief  This is a 41 year old female with past medical history of chronic renal disease stage IV with baseline creatinine of 5.5-6 not dialysis, also past medical history of hypertension, diabetes with an unknown hemoglobin A1c that comes in for cough and shortness of breath. She relates this has progressively gotten worse. She relates this started 4 days prior to admission with productive cough and 3 days prior to admission she started developing shortness of breath. Here in the emergency room had to put her on CPAP and she was saturating in the 80s. Currently here in the ED she is on a Venturi mask satting greater than 90%. She relates she has had no fever, no diarrhea, no abdominal pain, no burning when she urinates or weakness. She relates no hemoptysis PND or edema.   Hospital Course:  The patient was admitted to the down unit and she was started on hemodialysis, her breathing significantly improved. She Was then transitioned to the medical floor. Patient underwent permacath placement and AV graft  however unfortunately she developed a steal syndrome and AV graft was removed . Patient was seen by Dr. Oneida Alar from vascular surgerywho recommended followup with Dr. Scot Dock in 2 weeks to remove stables and consider new access.    Patient was followed by nephrology and  arrangement made for outpatient dialysis on TTS. She was dialyzed today She was treated for suspected pneumonia with Zithromax and Rocephin, then po Levaquin and completed 7 days of antibiotics.  Blood pressure is uncontrolled and fluctuates, she was also noted to have orthostatic hypotension, other medications were continued , continue to adjust as an outpatient as necessary. She also had episodes of hypoglycemia and Lantus dose was decreased to 5 units each bedtime, continue to monitor CBGs and adjust insulin dose. Anemia ;Continued on  ferrlecit /Epogen with dialysis. She was transfused 2 units of blood with dialysis today. Patient was followed by PT and inpatient rehabilitation the patient was recommended, patient was accepted. Patient seen and examined by me today and she is stable for discharge .   Filed Vitals:   10/08/11 1100  BP: 212/92  Pulse: 82  Temp:   Resp: 15    General: Alert, awake, oriented x3, in no acute distress.  Heart: Regular rate and rhythm, without murmurs, rubs, gallops.  Lungs: Clear to auscultation bilaterally. Right IJ Diatek catheter in place  Abdomen: Soft, nontender, nondistended, positive bowel sounds.  Extremities: No clubbing cyanosis or edema with positive pedal pulses. Left hand warm. Neuro: Grossly intact, nonfocal.     Disposition and Follow-up:  Follow with Dr Scot Dock in 2 weeks to remove staples  Follow with PCP/nephrologist as needed  Time spent on Discharge: Approximately 45 minutes    Signed: Kanan Sobek 10/08/2011, 11:40 AM

## 2011-10-09 DIAGNOSIS — I10 Essential (primary) hypertension: Secondary | ICD-10-CM

## 2011-10-09 DIAGNOSIS — R5381 Other malaise: Secondary | ICD-10-CM

## 2011-10-09 DIAGNOSIS — Z5189 Encounter for other specified aftercare: Secondary | ICD-10-CM

## 2011-10-09 DIAGNOSIS — J159 Unspecified bacterial pneumonia: Secondary | ICD-10-CM

## 2011-10-09 DIAGNOSIS — N186 End stage renal disease: Secondary | ICD-10-CM

## 2011-10-09 LAB — GLUCOSE, CAPILLARY
Glucose-Capillary: 106 mg/dL — ABNORMAL HIGH (ref 70–99)
Glucose-Capillary: 127 mg/dL — ABNORMAL HIGH (ref 70–99)
Glucose-Capillary: 113 mg/dL — ABNORMAL HIGH (ref 70–99)
Glucose-Capillary: 121 mg/dL — ABNORMAL HIGH (ref 70–99)

## 2011-10-09 LAB — TYPE AND SCREEN
ABO/RH(D): A POS
Antibody Screen: NEGATIVE
Unit division: 0
Unit division: 0

## 2011-10-09 LAB — MRSA PCR SCREENING: MRSA by PCR: NEGATIVE

## 2011-10-09 MED ORDER — BOOST / RESOURCE BREEZE PO LIQD: 1.0000 | Freq: Every day | ORAL | Status: DC | PRN

## 2011-10-09 NOTE — Progress Notes (Signed)
Overall Plan of Care [facility]) Patient Details Name: [name] MRN: [id number] DOB: [birth year]  Diagnosis:  Deconditioning after respiratory failure and failed AVG  Primary Diagnosis:    Physical deconditioning Co-morbidities: ESRD, HTN, DM  Functional Problem List  Patient demonstrates impairments in the following areas: Balance and Endurance  Basic ADL's: bathing, dressing and toileting Advanced ADL's: simple meal preparation  Transfers:  bed to chair, car and furniture Locomotion:  ambulation and stairs  Additional Impairments:  None  Anticipated Outcomes Item Anticipated Outcome  Eating/Swallowing    Basic self-care  Mod I  Tolieting  Mod I  Bowel/Bladder  Regular bm Q1-2 days/new HD pt,educated on HD management  Transfers  Mod I  Locomotion  Supervision with gait 150'  Communication    Cognition    Pain  Relief w/ goal <2-3  Safety/Judgment  Safe transfers,demostrates safety awareness and follows safety plan  Other  Able to verbalize and demonstrate understanding of HD,process and management/demonstrates& understanding of DM and management   Therapy Plan: PT Frequency: 2-3 X/day, 60-90 minutes OT Frequency: 1-2 X/day, 60-90 minutes     Team Interventions: Item RN PT OT SLP SW TR Other  Self Care/Advanced ADL Retraining   x      Neuromuscular Re-Education         Therapeutic Activities  x x   x   UE/LE Strength Training/ROM  x x      UE/LE Coordination Activities  x       Visual/Perceptual Remediation/Compensation         DME/Adaptive Equipment Instruction  x x   x   Therapeutic Exercise  x x   x   Balance/Vestibular Training  x x   x   Patient/Family Education  x x   x   Cognitive Remediation/Compensation         Functional Mobility Training  x x   x   Ambulation/Gait Training  x       Stair Training  x       Wheelchair Propulsion/Positioning  x    x   Functional IT sales professional Reintegration  x x   x     Dysphagia/Aspiration Environmental consultant         Bladder Management         Bowel Management         Disease Management/Prevention  x x      Pain Management         Medication Management         Skin Care/Wound Management         Splinting/Orthotics         Discharge Planning     x x   Psychosocial Support     x x                      Team Discharge Planning: Destination:  parent's home Projected Follow-up:  PT and Outpatient Projected Equipment Needs:  None, Possibly a tub bench Patient/family involved in discharge planning:  Yes  MD ELOS: one week Medical Rehab Prognosis:  Excellent Assessment: pt admitted for CIR therapies with focus on functional mobility, endurance, safety, ADL's, balance, and adaptive equipment training with modified independent to supervision goals.  Pt is fairly motivated and should progress nicely.

## 2011-10-09 NOTE — Evaluation (Signed)
Recreational Therapy Assessment and Plan  Patient Details  Name: [name] MRN: [id number] Date of Birth: 1971-04-01 Today's Date: 10/09/2011  Rehab Potential: Good ELOS: 7 days   Assessment Clinical Impression:Patient is a 41 y.o. year old female with recent admission to the hospital on 4/12 with pneumonia (bilateral lower lobe, and right upper lobe) Patient with chronic renal disease, now new to hemodialysis. Patient transferred to CIR on 10/08/2011 .   Pt presents with decreased activity tolerance, decreased functional mobility, decreased balance limiting pt's independence with leisure/commuity pursuits.  Plan Rec Therapy Plan Is patient appropriate for Therapeutic Recreation?: Yes Rehab Potential: Good Treatment times per week: Min 1 time for community reintegration Estimated Length of Stay: 7 days TR Treatment/Interventions: Adaptive equipment instruction;1:1 session;Balance/vestibular training;Community reintegration;Functional mobility training;Recreation/leisure participation;Therapeutic activities  Recommendations for other services: None  Discharge Criteria: Patient will be discharged from TR if patient refuses treatment 3 consecutive times without medical reason.  If treatment goals not met, if there is a change in medical status, if patient makes no progress towards goals or if patient is discharged from hospital.  The above assessment, treatment plan, treatment alternatives and goals were discussed and mutually agreed upon: by patient  [place] 10/09/2011, 5:49 PM

## 2011-10-09 NOTE — Evaluation (Signed)
Occupational Therapy Assessment and Plan  Patient Details  Name: [name] MRN: [id number] Date of Birth: 05-28-1971  OT Diagnosis: muscle weakness (generalized) Rehab Potential:  good ELOS:   7 days  Today's Date: 10/09/2011 Time: [id number] Time Calculation (min): 60 min  Problem List:  Patient Active Problem List  Diagnoses  . DIABETES MELLITUS II, UNCOMPLICATED  . HYPERLIPIDEMIA  . GOUT  . OBESITY, NOS  . HYPERTENSION, BENIGN SYSTEMIC  . FIBROADENOSIS, BREAST  . Irregular Menstrual Cycle  . VOMITING  . HTN (hypertension), malignant  . Chronic kidney disease, stage 4, severely decreased GFR  . PNA (pneumonia)  . Acute respiratory failure with hypoxia  . Physical deconditioning    Past Medical History:  Past Medical History  Diagnosis Date  . Chronic kidney disease   . Hypertension   . Diabetes mellitus   . Retinopathy   . Metabolic bone disease   . Anemia   . CHF (congestive heart failure)    Past Surgical History:  Past Surgical History  Procedure Date  . Insertion of dialysis catheter 10/04/2011    Procedure: INSERTION OF DIALYSIS CATHETER;  Surgeon: Angelia Mould, MD;  Location: [place];  Service: Vascular;  Laterality: Right;  insertion of dialysis catheter right internal jugular  . Av fistula placement 10/04/2011    Procedure: INSERTION OF ARTERIOVENOUS (AV) GORE-TEX GRAFT ARM;  Surgeon: Angelia Mould, MD;  Location: [place];  Service: Vascular;  Laterality: Left;  Insertion left upper arm Arteriovenous goretex graft  . Avgg removal 10/04/2011    Procedure: REMOVAL OF ARTERIOVENOUS GORETEX GRAFT ([place]);  Surgeon: Elam Dutch, MD;  Location: [facility] OR;  Service: Vascular;  Laterality: Left;    Assessment & Plan Clinical Impression: Patient is a 41 y.o. year old female with recent admission to the hospital on 4/12 with pneumonia (bilateral lower lobe, and right upper lobe)  Patient with chronic renal disease, now new to hemodialysis.  Patient  transferred to CIR on 10/08/2011 .    Patient currently requires min with basic self-care skills secondary to muscle weakness and decreased cardiorespiratoy endurance.  Prior to hospitalization, patient could complete ADL / IADL with no assistance..  Patient will benefit from skilled intervention to increase independence with basic self-care skills and increase level of independence with iADL prior to discharge home with care partner.  Anticipate patient will require intermittent supervision and no further OT follow recommended.     OT Evaluation Precautions/Restrictions  Precautions Precautions: Fall Precaution Comments: LIGHT HEADED Restrictions Weight Bearing Restrictions: No General Chart Reviewed: Yes Vital Signs Therapy Vitals Pulse Rate: 73  BP: 115/74 mmHg Patient Position, if appropriate: Sitting Oxygen Therapy SpO2: 93 % O2 Device: None (Room air) Pulse Oximetry Type: Intermittent Pain Pain Assessment Pain Assessment: No/denies pain Home Living/Prior Functioning Home Living Lives With: Alone Available Help at Discharge: Family Type of Home: House Home Access: Stairs to enter Technical brewer of Steps: 4 Entrance Stairs-Rails: Left Home Layout: One level Bathroom Shower/Tub: Chiropodist: Standard Additional Comments: Plans to stay with parents after discharge IADL History Homemaking Responsibilities: Yes   See FIM for current functional status Refer to Care Plan for Long Term Goals  Recommendations for other services: None  Discharge Criteria: Patient will be discharged from OT if patient refuses treatment 3 consecutive times without medical reason, if treatment goals not met, if there is a change in medical status, if patient makes no progress towards goals or if patient is discharged from hospital.  The  above assessment, treatment plan, treatment alternatives and goals were discussed and mutually agreed upon: by  patient  [name] 10/09/2011, 9:35 AM

## 2011-10-09 NOTE — Progress Notes (Signed)
 Patient ID: [name], female   DOB: 03-17-1971, 41 y.o.   MRN: [id number] Subjective/Complaints: Review of Systems  Respiratory: Positive for cough.   Neurological: Positive for dizziness and weakness.  All other systems reviewed and are negative.     Objective: Vital Signs: Blood pressure 115/74, pulse 73, temperature 98.2 F (36.8 C), temperature source Oral, resp. rate 20, height 5\' 2"  (1.575 m), weight 62.5 kg (137 lb 12.6 oz), last menstrual period 09/16/2011, SpO2 93.00%. No results found.  Basename 10/08/11 0807 10/07/11 0605  WBC 11.3* 9.5  HGB 7.5* 7.7*  HCT 23.3* 23.9*  PLT 332 307    Basename 10/08/11 0807 10/07/11 0605  NA 135 137  K 3.8 3.8  CL 98 99  CO2 29 29  GLUCOSE 86 60*  BUN 25* 19  CREATININE 5.18* 4.41*  CALCIUM 8.6 8.6   CBG (last 3)   Basename 10/09/11 0720 10/08/11 2103 10/08/11 1609  GLUCAP 106* 91 222*    Wt Readings from Last 3 Encounters:  10/08/11 62.5 kg (137 lb 12.6 oz)  10/08/11 57.9 kg (127 lb 10.3 oz)  10/08/11 57.9 kg (127 lb 10.3 oz)    Physical Exam:  General appearance: alert, cooperative and no distress Head: Normocephalic, without obvious abnormality, atraumatic Eyes: conjunctivae/corneas clear. PERRL, EOM's intact. Fundi benign. Ears: normal TM's and external ear canals both ears Nose: Nares normal. Septum midline. Mucosa normal. No drainage or sinus tenderness. Throat: lips, mucosa, and tongue normal; teeth and gums normal Neck: no adenopathy, no carotid bruit, no JVD, supple, symmetrical, trachea midline and thyroid not enlarged, symmetric, no tenderness/mass/nodules Back: symmetric, no curvature. ROM normal. No CVA tenderness. Resp: clear to auscultation bilaterally Cardio: regular rate and rhythm, S1, S2 normal, no murmur, click, rub or gallop GI: soft, non-tender; bowel sounds normal; no masses,  no organomegaly Extremities: extremities normal, atraumatic, no cyanosis or edema Pulses: 2+ and symmetric Skin:  Skin color, texture, turgor normal. No rashes or lesions Neurologic: Grossly normal-generalized trunk and proximal ext weakness. Cognitively intact. CN norml Incision/Wound: LUE incisions clean with staples. Chest sites clean.   Assessment/Plan: 1. Functional deficits secondary to deconditioning after pneumonia/respiratory failure/ acute on chronic renal failure which require 3+ hours per day of interdisciplinary therapy in a comprehensive inpatient rehab setting. Physiatrist is providing close team supervision and 24 hour management of active medical problems listed below. Physiatrist and rehab team continue to assess barriers to discharge/monitor patient progress toward functional and medical goals. FIM: FIM - Bathing Bathing Steps Patient Completed: Chest;Right Arm;Left Arm;Abdomen;Right upper leg;Left upper leg;Right lower leg (including foot);Left lower leg (including foot) Bathing: 4: Min-Patient completes 8-9 36f 10 parts or 75+ percent  FIM - Upper Body Dressing/Undressing Upper body dressing/undressing steps patient completed: Thread/unthread left bra strap;Hook/unhook bra;Thread/unthread right sleeve of pullover shirt/dresss;Thread/unthread left sleeve of pullover shirt/dress;Put head through opening of pull over shirt/dress;Pull shirt over trunk Upper body dressing/undressing: 4: Min-Patient completed 75 plus % of tasks FIM - Lower Body Dressing/Undressing Lower body dressing/undressing steps patient completed: Thread/unthread right underwear leg;Thread/unthread left underwear leg;Thread/unthread right pants leg;Thread/unthread left pants leg;Fasten/unfasten pants;Don/Doff right sock;Don/Doff left sock Lower body dressing/undressing: 4: Min-Patient completed 75 plus % of tasks        FIM - Bed/Chair Transfer Bed/Chair Transfer: 3: Bed > Chair or W/C: Mod A (lift or lower assist)     Comprehension Comprehension Mode: Auditory Comprehension: 7-Follows complex  conversation/direction: With no assist  Expression Expression Mode: Verbal Expression: 7-Expresses complex ideas: With no assist  Social  Interaction Social Interaction: 7-Interacts appropriately with others - No medications needed.  Problem Solving Problem Solving: 5-Solves complex 90% of the time/cues < 10% of the time  Memory Memory: 7-Complete Independence: No helper  1. Deconditioning after pneumonia with severe chronic kidney disease  2. DVT Prophylaxis/Anticoagulation: SCDs. Monitor for any signs of deep vein thrombosis  3. ID/pneumonia. Completes Levaquin 10/15/11. Followup chest x-ray as needed. Check oxygen saturation every shift.  4. End-stage renal disease. Patient with dialysis initiated during this hospital stay. Weigh patient daily. Vascular surgery to followup for permanent access. Following volumes also.   -probably needs HD catheter re-dressed to allow for showering 5. Hypertension. Catapres 0.2 mg 3 times a day, hydralazine 50 mg 4 times a day, bystolic 20 mg daily.    -dizzy this morning likely due to decreased bp. Will adjust regimen  if persistent drops and symptoms 6. Diabetes mellitus. Hemoglobin A1c 6.1. Lantus insulin 5 units each bedtime. Check blood sugars a.c. and at bedtime- fair/borderline control at present. 7. Chronic anemia hemoglobin 7.5-9.3. Followup labs with hemodialysis/transfused 2 units 10/08/2011   LOS (Days) 1 A FACE TO FACE EVALUATION WAS PERFORMED  , T 10/09/2011, 9:47 AM

## 2011-10-09 NOTE — Evaluation (Signed)
Physical Therapy Assessment and Plan  Patient Details  Name: [name] MRN: [id number] Date of Birth: 12-19-1970  PT Diagnosis: Difficulty walking and Muscle weakness Rehab Potential: Excellent ELOS: 7 days   Today's Date: 10/09/2011 Time: [id number] Time Calculation (min): 53 min  Problem List:  Patient Active Problem List  Diagnoses  . DIABETES MELLITUS II, UNCOMPLICATED  . HYPERLIPIDEMIA  . GOUT  . OBESITY, NOS  . HYPERTENSION, BENIGN SYSTEMIC  . FIBROADENOSIS, BREAST  . Irregular Menstrual Cycle  . VOMITING  . HTN (hypertension), malignant  . Chronic kidney disease, stage 4, severely decreased GFR  . PNA (pneumonia)  . Acute respiratory failure with hypoxia  . Physical deconditioning    Past Medical History:  Past Medical History  Diagnosis Date  . Chronic kidney disease   . Hypertension   . Diabetes mellitus   . Retinopathy   . Metabolic bone disease   . Anemia   . CHF (congestive heart failure)    Past Surgical History:  Past Surgical History  Procedure Date  . Insertion of dialysis catheter 10/04/2011    Procedure: INSERTION OF DIALYSIS CATHETER;  Surgeon: Angelia Mould, MD;  Location: [facility];  Service: Vascular;  Laterality: Right;  insertion of dialysis catheter right internal jugular  . Av fistula placement 10/04/2011    Procedure: INSERTION OF ARTERIOVENOUS (AV) GORE-TEX GRAFT ARM;  Surgeon: Angelia Mould, MD;  Location: [facility];  Service: Vascular;  Laterality: Left;  Insertion left upper arm Arteriovenous goretex graft  . Avgg removal 10/04/2011    Procedure: REMOVAL OF ARTERIOVENOUS GORETEX GRAFT ([place]);  Surgeon: Elam Dutch, MD;  Location: [facility];  Service: Vascular;  Laterality: Left;    Assessment & Plan Clinical Impression: Patient is a 41 y.o. year old female with recent admission to the hospital on 09/27/11 with pnemonia.  Patient transferred to CIR on 10/08/2011 .   Patient currently requires min with mobility secondary to  muscle weakness, chronic hypertension and decreased standing balance and decreased balance strategies.  Prior to hospitalization, patient was independent in community, working full time and living alone. Pt to discharge to parents home.  Home access is 4Stairs to enter.  Patient will benefit from skilled PT intervention to maximize safe functional mobility, minimize fall risk and decrease caregiver burden for planned discharge home with 24 hour supervision.  Anticipate patient will benefit from follow up OP at discharge.  PT - End of Session Activity Tolerance: Improving;Tolerates 30+ min activity with multiple rests PT Assessment Rehab Potential: Excellent PT Plan PT Frequency: 2-3 X/day, 60-90 minutes Estimated Length of Stay: 7 days PT Treatment/Interventions: Ambulation/gait training;Balance/vestibular training;Community reintegration;Discharge planning;Disease management/prevention;Functional mobility training;Patient/family education;Stair training;Therapeutic Activities;Therapeutic Exercise;UE/LE Strength taining/ROM;UE/LE Coordination activities PT Recommendation Follow Up Recommendations: Outpatient PT  PT Evaluation Precautions/Restrictions Precautions Precautions: Fall Precaution Comments: LIGHT HEADED Restrictions Weight Bearing Restrictions: No General @[id number] ([phone]) Vital Signs Therapy Vitals Pulse Rate: 73  (EHR=76) BP: 194/95 mmHg (Exercise BP =152/83) Patient Position, if appropriate: Sitting Oxygen Therapy SpO2: 93 % O2 Device: None (Room air) Pulse Oximetry Type: Intermittent Pain Pain Assessment Pain Assessment: No/denies pain Home Living/Prior Functioning Home Living Lives With: Alone (pt to discharge to parents home) Available Help at Discharge: Family Type of Home: House Home Access: Stairs to enter Technical brewer of Steps: 4 Entrance Stairs-Rails: Left Home Layout: One level Bathroom Shower/Tub: Chiropodist:  Standard Additional Comments: Plans to stay with parents after discharge Prior Function Level of Independence: Independent with basic ADLs;Independent with homemaking with  ambulation;Independent with gait;Independent with transfers Able to Take Stairs?: Yes Vocation: Full time employment Vocation Requirements: desk computer work Leisure: Hobbies-yes (Comment) Comments: bowling,pool, sing in the church choir, rolling skating, church Consulting civil engineer, walking Vision/Perception  Vision - History Baseline Vision: Wears glasses all the time  Cognition Overall Cognitive Status: Appears within functional limits for tasks assessed Sensation Sensation Light Touch: Appears Intact Motor  Motor Motor: Within Functional Limits  Mobility Transfers Sit to Stand: 1: +2 Total assist (+2 initially for safety but then min@) Stand to Sit: 4: Min assist Locomotion  Ambulation Ambulation/Gait Assistance: 1: +2 Total assist (intially +2 for safety then min@) Assistive device: 2 person hand held assist (progressed to no HHA during session) Gait Gait Pattern: Decreased step length - right;Decreased step length - left;Narrow base of support Stairs / Additional Locomotion Stairs: Yes Stairs Assistance: 4: Min assist Stair Management Technique: One rail Right Number of Stairs: 5  Height of Stairs: 6   Trunk/Postural Assessment     Balance Dynamic Standing Balance Dynamic Standing - Balance Support: No upper extremity supported Dynamic Standing - Level of Assistance: 4: Min assist Dynamic Standing - Comments: folding towels, pt with LOB that she was able to correct. Extremity Assessment      RLE Assessment RLE Assessment: Within Functional Limits (grossly 4-5/5) LLE Assessment LLE Assessment: Within Functional Limits (grossly 4-5/5)  See FIM for current functional status Refer to Care Plan for Long Term Goals  Recommendations for other services: None  Discharge Criteria: Patient will be  discharged from PT if patient refuses treatment 3 consecutive times without medical reason, if treatment goals not met, if there is a change in medical status, if patient makes no progress towards goals or if patient is discharged from hospital.  The above assessment, treatment plan, treatment alternatives and goals were discussed and mutually agreed upon: by patient  Waylan Boga 10/09/2011, 11:25 AM

## 2011-10-09 NOTE — Progress Notes (Signed)
Physical Therapy Session Note  Patient Details  Name: [name] MRN: [id number] Date of Birth: Jun 01, 1971  Today's Date: 10/09/2011 Time: [id number] Time Calculation (min): 45 min  Short Term Goals: Week 1:  PT Short Term Goal 1 (Week 1): same as LTGs  Skilled Therapeutic Interventions/Progress Updates:  Development worker, international aid, see results below. Dynamic gait balance with cone tapping, up to moderate assist without UE assist. Ambulation x 40' with steady assist. Pt limited by dizziness and seeing "spots." BP = 131/74, SpO2 = 97%, HR = 72. Recovered with seated rest. RN made aware. Performed Biodex balance activities working on static and dynamic balance, pt demonstrating improvement with each task (for instance with static balance pt went from 15% to 30% to 80% with 3 trials).   Therapy Documentation Precautions:  Precautions Precautions: Fall Precaution Comments: LIGHT HEADED Restrictions Weight Bearing Restrictions: No Vital Signs: Therapy Vitals Temp: 97.6 F (36.4 C) Temp src: Oral Pulse Rate: 72  Resp: 20  BP: 162/82 mmHg Patient Position, if appropriate: Sitting Oxygen Therapy SpO2: 96 % O2 Device: None (Room air) Pain: Pain Assessment Pain Assessment: No/denies pain Locomotion : Ambulation Ambulation/Gait Assistance: 3: Mod assist with no handhold assistance. Chair had to be brought to pt as she became dizzy indicating increased risk for falls. Pt with short, shuffled steps, overall significant decreased balance. Ambulated 40'     Balance: Standardized Balance Assessment Standardized Balance Assessment: Berg Balance Test Berg Balance Test Sit to Stand: Needs minimal aid to stand or to stabilize Standing Unsupported: Able to stand 2 minutes with supervision Sitting with Back Unsupported but Feet Supported on Floor or Stool: Able to sit safely and securely 2 minutes Stand to Sit: Controls descent by using hands Transfers: Able to transfer with verbal cueing and  /or supervision Standing Unsupported with Eyes Closed: Needs help to keep from falling Standing Ubsupported with Feet Together: Needs help to attain position and unable to hold for 15 seconds From Standing, Reach Forward with Outstretched Arm: Reaches forward but needs supervision From Standing Position, Pick up Object from Floor: Able to pick up shoe, needs supervision From Standing Position, Turn to Look Behind Over each Shoulder: Needs supervision when turning Turn 360 Degrees: Needs close supervision or verbal cueing Standing Unsupported, Alternately Place Feet on Step/Stool: Needs assistance to keep from falling or unable to try Standing Unsupported, One Foot in Front: Needs help to step but can hold 15 seconds Standing on One Leg: Tries to lift leg/unable to hold 3 seconds but remains standing independently Total Score: 21   See FIM for current functional status  Therapy/Group: Individual Therapy  Lahoma Rocker 10/09/2011, 6:56 PM

## 2011-10-09 NOTE — Plan of Care (Signed)
Problem: RH BOWEL ELIMINATION Goal: RH STG MANAGE BOWEL WITH ASSISTANCE STG Manage Bowel with Assistance.independent  Outcome: Not Progressing No BM since 10-05-11, sorbitol given 10-08-11

## 2011-10-09 NOTE — Progress Notes (Addendum)
INITIAL ADULT NUTRITION ASSESSMENT Date: 10/09/2011   Time: 9:05 AM  Reason for Assessment: MD consult  ASSESSMENT: Female 41 y.o.  Dx: Physical deconditioning  Hx:  Past Medical History  Diagnosis Date  . Chronic kidney disease   . Hypertension   . Diabetes mellitus   . Retinopathy   . Metabolic bone disease   . Anemia   . CHF (congestive heart failure)    Past Surgical History  Procedure Date  . Insertion of dialysis catheter 10/04/2011    Procedure: INSERTION OF DIALYSIS CATHETER;  Surgeon: Angelia Mould, MD;  Location: [place];  Service: Vascular;  Laterality: Right;  insertion of dialysis catheter right internal jugular  . Av fistula placement 10/04/2011    Procedure: INSERTION OF ARTERIOVENOUS (AV) GORE-TEX GRAFT ARM;  Surgeon: Angelia Mould, MD;  Location: [facility];  Service: Vascular;  Laterality: Left;  Insertion left upper arm Arteriovenous goretex graft  . Avgg removal 10/04/2011    Procedure: REMOVAL OF ARTERIOVENOUS GORETEX GRAFT ([place]);  Surgeon: Elam Dutch, MD;  Location: [facility] OR;  Service: Vascular;  Laterality: Left;   Related Meds:     . cloNIDine  0.2 mg Oral TID  . hydrALAZINE  50 mg Oral QID  . insulin aspart  0-5 Units Subcutaneous QHS  . insulin glargine  5 Units Subcutaneous QHS  . levofloxacin  500 mg Oral Q48H  . multivitamin  1 tablet Oral Daily  . nebivolol  20 mg Oral Daily   Ht: 5\' 2"  (157.5 cm)  Wt: 137 lb 12.6 oz (62.5 kg)  Ideal Wt: 50 kg % Ideal Wt: 125%  Wt Readings from Last 15 Encounters:  10/08/11 137 lb 12.6 oz (62.5 kg)  10/08/11 127 lb 10.3 oz (57.9 kg)  10/08/11 127 lb 10.3 oz (57.9 kg)  10/08/11 127 lb 10.3 oz (57.9 kg)  12/25/09 158 lb 14.4 oz (72.077 kg)  12/06/09 156 lb 11.2 oz (71.079 kg)  11/29/09 159 lb (72.122 kg)  09/22/09 163 lb (73.936 kg)  04/28/09 161 lb 11.2 oz (73.347 kg)  03/31/09 163 lb (73.936 kg)  02/13/09 165 lb 6.4 oz (75.025 kg)  01/11/09 168 lb 8 oz (76.431 kg)  12/21/08 170 lb  (77.111 kg)  09/02/07 181 lb (82.101 kg)  08/06/07 180 lb 8 oz (81.874 kg)  Usual Wt: 160 lb % Usual Wt: 84%  Body mass index is 25.20 kg/(m^2). Pt is overweight.  Food/Nutrition Related Hx: highly fluctuating PO intake  Labs:  CMP     Component Value Date/Time   NA 135 10/08/2011 0807   K 3.8 10/08/2011 0807   CL 98 10/08/2011 0807   CO2 29 10/08/2011 0807   GLUCOSE 86 10/08/2011 0807   GLUCOSE 318 03/31/2009 0815   BUN 25* 10/08/2011 0807   CREATININE 5.18* 10/08/2011 0807   CALCIUM 8.6 10/08/2011 0807   CALCIUM 8.5 04/02/2011 0514   PROT 6.7 09/27/2011 1926   ALBUMIN 2.1* 10/07/2011 0605   AST 21 09/27/2011 1926   ALT 10 09/27/2011 1926   ALKPHOS 58 09/27/2011 1926   BILITOT 0.2* 09/27/2011 1926   GFRNONAA 9* 10/08/2011 0807   GFRAA 11* 10/08/2011 0807  Phosphorus 5.2H   Intake/Output Summary (Last 24 hours) at 10/09/11 0909 Last data filed at 10/08/11 2000  Gross per 24 hour  Intake    120 ml  Output      0 ml  Net    120 ml   Diet Order: Renal 80-90  Supplements/Tube Feeding: none  IVF:    Estimated Nutritional Needs:   Kcal: [id number] - 2130 kcal Protein:  73 - 85 grams Fluid:  1.2 L/d  Past medical hx significant for CKD Stage IV and DM.   Temporary HD cath placed 4/13, acute HD on 4/13. L AV graft placed 4/19, but developed ischemic steal and required removal. Will need access as outpt.   RD consulted for HD education yesterday (4/23). Reviewed Choose-A-Meal Booklet with patient.   Pt denotes slow and steady weight loss since 2009. Pt attributes wt decline to poor intake 2/2 brother's death. Pt states intake remains highly variable. Intake better now that she is in inpt rehab, per pt  NUTRITION DIAGNOSIS: Increased nutrient needs (NI-2.1).  Status: Ongoing  RELATED TO: HD  AS EVIDENCE BY: estimated needs  MONITORING/EVALUATION(Goals): Goal: Pt to consume >/= 75% of meals and supplements. Monitor: need for further education, weights, labs, I/O's  EDUCATION  NEEDS: -Education needs addressed  INTERVENTION: 1. Pt denies any questions/concerns at this time. Will continue to reinforce renal education. 2. Eating well. Will add [place]ze PO daily PRN. 3. RD to continue to follow nutrition care plan  Dietitian #: [phone]  [place] Per approved criteria  -Not Applicable   Asencion Partridge 10/09/2011, 9:05 AM

## 2011-10-09 NOTE — Progress Notes (Signed)
[facility] ROUNDING NOTE   Subjective:   Interval History: none.  Objective:  Vital signs in last 24 hours:  Temp:  [97.3 F (36.3 C)-99 F (37.2 C)] 98.2 F (36.8 C) (04/24 0519) Pulse Rate:  [71-89] 73  (04/24 0932) Resp:  [15-20] 20  (04/24 0519) BP: (115-212)/(62-95) 115/74 mmHg (04/24 0932) SpO2:  [93 %-100 %] 93 % (04/24 0932) Weight:  [57.9 kg (127 lb 10.3 oz)-62.5 kg (137 lb 12.6 oz)] 62.5 kg (137 lb 12.6 oz) (04/23 1615)  Weight change:  Filed Weights   10/08/11 1615  Weight: 62.5 kg (137 lb 12.6 oz)    Intake/Output: I/O last 3 completed shifts: In: 120 [P.O.:120] Out: -    Intake/Output this shift:  Total I/O In: 240 [P.O.:240] Out: -   CVS- RRR RS- CTA ABD- BS present soft non-distended EXT- no edema   Basic Metabolic Panel:  Lab 0000000 0807 10/07/11 0605 10/04/11 0807 10/03/11 0631  NA 135 137 136 134*  K 3.8 3.8 4.6 3.6  CL 98 99 99 99  CO2 29 29 27 23   GLUCOSE 86 60* 90 76  BUN 25* 19 23 60*  CREATININE 5.18* 4.41* 3.76* 5.64*  CALCIUM 8.6 8.6 8.5 --  MG -- -- -- --  PHOS 5.2* 4.7* -- 4.9*    Liver Function Tests:  Lab 10/07/11 0605 10/03/11 0631  AST -- --  ALT -- --  ALKPHOS -- --  BILITOT -- --  PROT -- --  ALBUMIN 2.1* 2.2*   No results found for this basename: LIPASE:5,AMYLASE:5 in the last 168 hours No results found for this basename: AMMONIA:3 in the last 168 hours  CBC:  Lab 10/08/11 0807 10/07/11 0605 10/04/11 0807 10/03/11 0630  WBC 11.3* 9.5 10.7* 10.9*  NEUTROABS -- -- -- --  HGB 7.5* 7.7* 9.3* 9.3*  HCT 23.3* 23.9* 28.5* 28.4*  MCV 81.5 81.6 79.6 78.7  PLT 332 307 241 205    Cardiac Enzymes: No results found for this basename: CKTOTAL:5,CKMB:5,CKMBINDEX:5,TROPONINI:5 in the last 168 hours  BNP: No components found with this basename: POCBNP:5  CBG:  Lab 10/09/11 0720 10/08/11 2103 10/08/11 1609 10/08/11 1235 10/07/11 2135  GLUCAP 106* 91 222* 104* 118*    Microbiology: Results  for orders placed during the hospital encounter of 10/08/11  MRSA PCR SCREENING     Status: Normal   Collection Time   10/08/11 11:04 PM      Component Value Range Status Comment   MRSA by PCR NEGATIVE  NEGATIVE  Final     Coagulation Studies: No results found for this basename: LABPROT:5,INR:5 in the last 72 hours  Urinalysis: No results found for this basename: COLORURINE:2,APPERANCEUR:2,LABSPEC:2,PHURINE:2,GLUCOSEU:2,HGBUR:2,BILIRUBINUR:2,KETONESUR:2,PROTEINUR:2,UROBILINOGEN:2,NITRITE:2,LEUKOCYTESUR:2 in the last 72 hours    Imaging: No results found.   Medications:        . cloNIDine  0.2 mg Oral TID  . hydrALAZINE  50 mg Oral QID  . insulin aspart  0-5 Units Subcutaneous QHS  . insulin glargine  5 Units Subcutaneous QHS  . levofloxacin  500 mg Oral Q48H  . multivitamin  1 tablet Oral Daily  . nebivolol  20 mg Oral Daily   acetaminophen, ondansetron (ZOFRAN) IV, oxyCODONE, polyethylene glycol, sorbitol  Assessment/ Plan:  1. ESRD new start TTS 2. vasc access- appreciate VVS assistance.Steal syndrome will need plan as outpatient 3. Anemia of chronic disease- on EPO/iron drop today, contacted primary team 4. HTN- Amlodipine stable 5. Disposition [facility] TTS but very weak and not quite ready  for discharge   LOS: 1 [name] W @TODAY @10 :29 AM

## 2011-10-09 NOTE — Progress Notes (Signed)
Physical Therapy Session Note  Patient Details  Name: [name] MRN: [id number] Date of Birth: [birth year]  Today's Date: 10/09/2011 Time: [id number] Time Calculation (min): 27 min  Short Term Goals: Week 1:  PT Short Term Goal 1 (Week 1): same as LTGs  S:  Pt expressing her frustration with having difficulty with the biodex earlier.  Skilled Therapeutic Interventions/Progress Updates:    Strengthening exercise on the Kinetron, initially in sitting for 3 min x 2 on 60 cm2, then in standing for 3 min on 40cm2.  Gait training on unit x 80' with decreased speed.  Pt reporting fatigue and need to rest at end of session.  Therapy Documentation Precautions:  Precautions Precautions: Fall Precaution Comments: LIGHT HEADED Restrictions Weight Bearing Restrictions: No Pain: Pain Assessment Pain Assessment: No/denies pain See FIM for current functional status  Therapy/Group: Individual Therapy  Waylan Boga 10/09/2011, 3:55 PM

## 2011-10-10 ENCOUNTER — Inpatient Hospital Stay (HOSPITAL_COMMUNITY): Payer: Medicaid Other

## 2011-10-10 LAB — RENAL FUNCTION PANEL
Albumin: 2.4 g/dL — ABNORMAL LOW (ref 3.5–5.2)
BUN: 22 mg/dL (ref 6–23)
CO2: 26 mEq/L (ref 19–32)
Calcium: 8.8 mg/dL (ref 8.4–10.5)
Chloride: 96 mEq/L (ref 96–112)
Creatinine, Ser: 4.49 mg/dL — ABNORMAL HIGH (ref 0.50–1.10)
GFR calc Af Amer: 13 mL/min — ABNORMAL LOW (ref >90)
GFR calc non Af Amer: 11 mL/min — ABNORMAL LOW (ref >90)
Glucose, Bld: 124 mg/dL — ABNORMAL HIGH (ref 70–99)
Phosphorus: 4.4 mg/dL (ref 2.3–4.6)
Potassium: 3.7 mEq/L (ref 3.5–5.1)
Sodium: 133 mEq/L — ABNORMAL LOW (ref 135–145)

## 2011-10-10 LAB — GLUCOSE, CAPILLARY
Glucose-Capillary: 110 mg/dL — ABNORMAL HIGH (ref 70–99)
Glucose-Capillary: 112 mg/dL — ABNORMAL HIGH (ref 70–99)
Glucose-Capillary: 185 mg/dL — ABNORMAL HIGH (ref 70–99)
Glucose-Capillary: 102 mg/dL — ABNORMAL HIGH (ref 70–99)
Glucose-Capillary: 171 mg/dL — ABNORMAL HIGH (ref 70–99)

## 2011-10-10 LAB — CBC
HCT: 32.3 % — ABNORMAL LOW (ref 36.0–46.0)
Hemoglobin: 11.1 g/dL — ABNORMAL LOW (ref 12.0–15.0)
MCH: 29 pg (ref 26.0–34.0)
MCHC: 34.4 g/dL (ref 30.0–36.0)
MCV: 84.3 fL (ref 78.0–100.0)
Platelets: 180 10*3/uL (ref 150–400)
RBC: 3.83 MIL/uL — ABNORMAL LOW (ref 3.87–5.11)
RDW: 15.4 % (ref 11.5–15.5)
WBC: 16.7 10*3/uL — ABNORMAL HIGH (ref 4.0–10.5)

## 2011-10-10 NOTE — Progress Notes (Signed)
Called to room by [place], patient working on bathing at sink, complained of feeling "woozy and sleepy". Tom took 3 reading of BP with dynamap. The reading were as follows: 122/80,103/70,99/69 over 2 minute period. Patient placed back in bed, patient stated feeling better lying down. Notified Marlowe Shores, Pa of episode and that patient had not received any blood pressure medication at 0800 as for hemodialysis today, no new orders at this time, continue to monitor. Roberts-VonCannon, Ziva Nunziata Selinda Eon

## 2011-10-10 NOTE — Progress Notes (Signed)
Nursing Note: Pt has been up to bathroom numerous times after laxative/had not had BM since 10/05/11. Pt has multiple stools,loose at this point.Pt c/o dizziness.This is not new for pt.T-97.7 P-81 R-19 BP- 190/60 taken manually Po2 96% on r/a. Pt felt better once back in bed.Pt shortly after c/o pain in l arm and medicated for pain.L arm elevated.[site where pt had fistula but had to be removed.}No complaints at this time.

## 2011-10-10 NOTE — Progress Notes (Signed)
Called to room by PT-chris, pt blood pressure standing without ted hose on on 119/56, thigh high ted hose placed on patient, working with physical therapy, patient  Relayed feeling tired, light headed recheck of BP by physical therapy 91/61, dropped again 78/52, sitting, patient to have therapy to tolerance, Joy Shores, Pa made aware of second episode of feeling tired, light headed, thigh high ted hose placed, no new orders given at this time, aware of encouraging fluids to 1200 cc for 24 hr total. will continue to monitor. Roberts-VonCannon, Carola Viramontes Selinda Eon

## 2011-10-10 NOTE — Progress Notes (Signed)
Nursing  Note; Pt asleep.wbb

## 2011-10-10 NOTE — Progress Notes (Signed)
Patient ID: [name], female   DOB: Sep 29, 1970, 41 y.o.   MRN: [id number] Patient ID: [name], female   DOB: 19-May-1971, 41 y.o.   MRN: [id number] Subjective/Complaints: Review of Systems  Respiratory: Positive for cough.   Neurological: Positive for dizziness and weakness.  All other systems reviewed and are negative.  loose stool yesterday and overnight. Last one about 2 hours ago.   Objective: Vital Signs: Blood pressure 166/75, pulse 82, temperature 98.1 F (36.7 C), temperature source Oral, resp. rate 19, height 5\' 2"  (1.575 m), weight 62.3 kg (137 lb 5.6 oz), last menstrual period 09/16/2011, SpO2 95.00%. No results found.  Basename 10/08/11 0807  WBC 11.3*  HGB 7.5*  HCT 23.3*  PLT 332    Basename 10/08/11 0807  NA 135  K 3.8  CL 98  CO2 29  GLUCOSE 86  BUN 25*  CREATININE 5.18*  CALCIUM 8.6   CBG (last 3)   Basename 10/10/11 0720 10/10/11 0206 10/09/11 2135  GLUCAP 112* 110* 121*    Wt Readings from Last 3 Encounters:  10/10/11 62.3 kg (137 lb 5.6 oz)  10/08/11 57.9 kg (127 lb 10.3 oz)  10/08/11 57.9 kg (127 lb 10.3 oz)    Physical Exam:  General appearance: alert, cooperative and no distress Head: Normocephalic, without obvious abnormality, atraumatic Eyes: conjunctivae/corneas clear. PERRL, EOM's intact. Fundi benign. Ears: normal TM's and external ear canals both ears Nose: Nares normal. Septum midline. Mucosa normal. No drainage or sinus tenderness. Throat: lips, mucosa, and tongue normal; teeth and gums normal Neck: no adenopathy, no carotid bruit, no JVD, supple, symmetrical, trachea midline and thyroid not enlarged, symmetric, no tenderness/mass/nodules Back: symmetric, no curvature. ROM normal. No CVA tenderness. Resp: clear to auscultation bilaterally Cardio: regular rate and rhythm, S1, S2 normal, no murmur, click, rub or gallop GI: soft, non-tender; bowel sounds normal; no masses,  no organomegaly Extremities: extremities normal,  atraumatic, no cyanosis or edema Pulses: 2+ and symmetric Skin: Skin color, texture, turgor normal. No rashes or lesions Neurologic: Grossly normal-generalized trunk and proximal ext weakness. Cognitively intact. CN norml Incision/Wound: LUE incisions clean with staples. Chest sites clean.   Assessment/Plan: 1. Functional deficits secondary to deconditioning after pneumonia/respiratory failure/ acute on chronic renal failure which require 3+ hours per day of interdisciplinary therapy in a comprehensive inpatient rehab setting. Physiatrist is providing close team supervision and 24 hour management of active medical problems listed below. Physiatrist and rehab team continue to assess barriers to discharge/monitor patient progress toward functional and medical goals. FIM: FIM - Bathing Bathing Steps Patient Completed: Chest;Right Arm;Left Arm;Abdomen;Right upper leg;Left upper leg;Right lower leg (including foot);Left lower leg (including foot) Bathing: 4: Min-Patient completes 8-9 30f 10 parts or 75+ percent  FIM - Upper Body Dressing/Undressing Upper body dressing/undressing steps patient completed: Thread/unthread left bra strap;Hook/unhook bra;Thread/unthread right sleeve of pullover shirt/dresss;Thread/unthread left sleeve of pullover shirt/dress;Put head through opening of pull over shirt/dress;Pull shirt over trunk Upper body dressing/undressing: 4: Min-Patient completed 75 plus % of tasks FIM - Lower Body Dressing/Undressing Lower body dressing/undressing steps patient completed: Thread/unthread right underwear leg;Thread/unthread left underwear leg;Thread/unthread right pants leg;Thread/unthread left pants leg;Fasten/unfasten pants;Don/Doff right sock;Don/Doff left sock Lower body dressing/undressing: 4: Min-Patient completed 75 plus % of tasks        FIM - Control and instrumentation engineer Devices: Arm rests Bed/Chair Transfer: 4: Chair or W/C > Bed: Min A (steadying  Pt. > 75%)  FIM - Locomotion: Wheelchair Locomotion: Wheelchair: 1: Total Assistance/staff pushes wheelchair (Pt<25%) FIM -  Locomotion: Ambulation Locomotion: Ambulation Assistive Devices: Other (comment) (HHA) Ambulation/Gait Assistance: 3: Mod assist Locomotion: Ambulation: 1: Travels less than 50 ft with moderate assistance (Pt: 50 - 74%)  Comprehension Comprehension Mode: Auditory Comprehension: 7-Follows complex conversation/direction: With no assist  Expression Expression Mode: Verbal Expression: 7-Expresses complex ideas: With no assist  Social Interaction Social Interaction: 7-Interacts appropriately with others - No medications needed.  Problem Solving Problem Solving: 5-Solves basic problems: With no assist  Memory Memory: 7-Complete Independence: No helper  1. Deconditioning after pneumonia with severe chronic kidney disease  2. DVT Prophylaxis/Anticoagulation: SCDs. Monitor for any signs of deep vein thrombosis  3. ID/pneumonia. Completes Levaquin 10/15/11. Followup chest x-ray as needed. Check oxygen saturation every shift.  4. End-stage renal disease. Patient with dialysis initiated during this hospital stay. Weigh patient daily. Vascular surgery to followup for permanent access. Following volumes also.   -remove staples today LUE 5. Hypertension. Catapres 0.2 mg 3 times a day, hydralazine 50 mg 4 times a day, bystolic 20 mg daily.    -dizzy when volume drops (ie yesterday with diarrhea)  -encouraged adequate dietary/fluid intake 6. Diabetes mellitus. Hemoglobin A1c 6.1. Lantus insulin 5 units each bedtime. Check blood sugars a.c. and at bedtime- fair/borderline control at present. 7. Chronic anemia hemoglobin 7.5-9.3. Followup labs with hemodialysis/transfused 2 units 10/08/2011- epo/iron per cka 8. Diarrhea- laxative effect.   -adequate fluids  -back off on further lax/softeners for now.   LOS (Days) 2 A FACE TO FACE EVALUATION WAS PERFORMED  Feliz Herard  T 10/10/2011, 8:31 AM

## 2011-10-10 NOTE — Care Management Note (Signed)
[facility] Individual Statement of Services  Patient Name:  [name]  Date:  10/10/2011  Welcome to the [facility].  Our goal is to provide you with an individualized program based on your diagnosis and situation, designed to meet your specific needs.  With this comprehensive rehabilitation program, you will be expected to participate in at least 3 hours of rehabilitation therapies Monday-Friday, with modified therapy programming on the weekends.  Your rehabilitation program will include the following services:  Physical Therapy (PT), Occupational Therapy (OT), 24 hour per day rehabilitation nursing, Therapeutic Recreaction (TR), Case Management (RN and Education officer, museum), Rehabilitation Medicine, Nutrition Services and Pharmacy Services  Weekly team conferences will be held on  Tuesday  to discuss your progress.  Your RN Case Writer will talk with you frequently to get your input and to update you on team discussions.  Team conferences with you and your family in attendance may also be held.  Expected length of stay: 7 days Overall anticipated outcome: Supervision  Depending on your progress and recovery, your program may change.  Your RN Case Engineer, production will coordinate services and will keep you informed of any changes.  Your RN Tourist information centre manager and SW names and contact numbers are listed  below.  The following services may also be recommended but are not provided by the [facility] will be made to provide these services after discharge if needed.  Arrangements include referral to agencies that provide these services.  Your insurance has been verified to be:  0 Your primary doctor is:  Dr. Robyn Haber  Pertinent information will be shared with your  doctor and your insurance company.  Case Manager: Lutricia Feil, [facility] [phone]  Social Worker:  [place], [place]  Information discussed with and copy given to patient by: [name], 10/10/2011, 4:09 PM

## 2011-10-10 NOTE — Progress Notes (Signed)
Occupational Therapy Session Note  Patient Details  Name: [name] MRN: [id number] Date of Birth: Aug 14, 1970  Today's Date: 10/10/2011 Time: [id number] Time Calculation (min): 35 min   Skilled Therapeutic Interventions/Progress Updates:    ADL retraining including bathing and dressing w/c level at sink.  Pt transferred bed to w/c with steady assist.  Pt completed UB bathing and dressing and c/o of feeling "woozy" and nauseous.  BP sitting 122/80, 103/70, 99/69 with one minute intervals.  Pt stated she felt like she could go to sleep.  RN notified and pt assisted back to bed.  Session terminated.  RN to notify PA.    Therapy Documentation Precautions:  Precautions Precautions: Fall Precaution Comments: LIGHT HEADED Restrictions Weight Bearing Restrictions: No General: General Amount of Missed OT Time (min): 25 Minutes   Pain: Pain Assessment Pain Assessment: No/denies pain  See FIM for current functional status  Therapy/Group: Individual Therapy  Leroy Libman 10/10/2011, 9:50 AM

## 2011-10-10 NOTE — Progress Notes (Signed)
[facility] ROUNDING NOTE   Subjective:   Interval History: none.  Objective:  Vital signs in last 24 hours:  Temp:  [97.6 F (36.4 C)-98.1 F (36.7 C)] 98.1 F (36.7 C) (04/25 0537) Pulse Rate:  [72-82] 82  (04/25 0537) Resp:  [19-20] 19  (04/25 0537) BP: (115-203)/(60-95) 166/75 mmHg (04/25 0537) SpO2:  [93 %-96 %] 95 % (04/25 0537) Weight:  [62.3 kg (137 lb 5.6 oz)] 62.3 kg (137 lb 5.6 oz) (04/25 0537)  Weight change: -0.2 kg (-7.1 oz) Filed Weights   10/08/11 1615 10/10/11 0537  Weight: 62.5 kg (137 lb 12.6 oz) 62.3 kg (137 lb 5.6 oz)    Intake/Output: I/O last 3 completed shifts: In: 840 [P.O.:840] Out: -    Intake/Output this shift:     CVS- RRR RS- CTA ABD- BS present soft non-distended EXT- no edema   Basic Metabolic Panel:  Lab 0000000 0807 10/07/11 0605 10/04/11 0807  NA 135 137 136  K 3.8 3.8 4.6  CL 98 99 99  CO2 29 29 27   GLUCOSE 86 60* 90  BUN 25* 19 23  CREATININE 5.18* 4.41* 3.76*  CALCIUM 8.6 8.6 8.5  MG -- -- --  PHOS 5.2* 4.7* --    Liver Function Tests:  Lab 10/07/11 0605  AST --  ALT --  ALKPHOS --  BILITOT --  PROT --  ALBUMIN 2.1*   No results found for this basename: LIPASE:5,AMYLASE:5 in the last 168 hours No results found for this basename: AMMONIA:3 in the last 168 hours  CBC:  Lab 10/08/11 0807 10/07/11 0605 10/04/11 0807  WBC 11.3* 9.5 10.7*  NEUTROABS -- -- --  HGB 7.5* 7.7* 9.3*  HCT 23.3* 23.9* 28.5*  MCV 81.5 81.6 79.6  PLT 332 307 241    Cardiac Enzymes: No results found for this basename: CKTOTAL:5,CKMB:5,CKMBINDEX:5,TROPONINI:5 in the last 168 hours  BNP: No components found with this basename: POCBNP:5  CBG:  Lab 10/10/11 0720 10/10/11 0206 10/09/11 2135 10/09/11 1613 10/09/11 1116  GLUCAP 112* 110* 121* 113* 127*    Microbiology: Results for orders placed during the hospital encounter of 10/08/11  MRSA PCR SCREENING     Status: Normal   Collection Time   10/08/11 11:04 PM       Component Value Range Status Comment   MRSA by PCR NEGATIVE  NEGATIVE  Final     Coagulation Studies: No results found for this basename: LABPROT:5,INR:5 in the last 72 hours  Urinalysis: No results found for this basename: COLORURINE:2,APPERANCEUR:2,LABSPEC:2,PHURINE:2,GLUCOSEU:2,HGBUR:2,BILIRUBINUR:2,KETONESUR:2,PROTEINUR:2,UROBILINOGEN:2,NITRITE:2,LEUKOCYTESUR:2 in the last 72 hours    Imaging: No results found.   Medications:        . cloNIDine  0.2 mg Oral TID  . hydrALAZINE  50 mg Oral QID  . insulin aspart  0-5 Units Subcutaneous QHS  . insulin glargine  5 Units Subcutaneous QHS  . levofloxacin  500 mg Oral Q48H  . multivitamin  1 tablet Oral Daily  . nebivolol  20 mg Oral Daily   acetaminophen, feeding supplement, ondansetron (ZOFRAN) IV, oxyCODONE, polyethylene glycol, sorbitol  Assessment/ Plan:   Assessment/ Plan:   1. ESRD new start TTS 2. vasc access- appreciate VVS assistance.Steal syndrome will need plan as outpatient 3. Anemia of chronic disease- on EPO/iron drop today, contacted primary team 4. HTN- Amlodipine stable 5. Disposition [facility] TTS but very weak and not quite ready for discharge   LOS: 2 Trinidad Ingle W @TODAY @8 :34 AM

## 2011-10-10 NOTE — Progress Notes (Signed)
Nursing Note: Am vitals t-98.1 p-82 r-19 -bp 166/75 Oxygen sat 95 % on r/a. Pt resting quietlyasleep.wbb

## 2011-10-10 NOTE — Progress Notes (Signed)
Physical Therapy Session Note  Patient Details  Name: [name] MRN: [id number] Date of Birth: 12-Jan-1971  Today's Date: 10/10/2011 Time: 1307-1330 23 minutes  Short Term Goals: Week 1:  PT Short Term Goal 1 (Week 1): same as LTGs  Skilled Therapeutic Interventions/Progress Updates:    Standing there-ex to tolerance (RN reports this is MD input on pt activity level with current BP fluctuations) working on balance, including hip and ankle strategies, standing with feet together and tandem, single leg stance; pt needs UE support to maintain balance otherwise needs external support to prevent fall; denied feelings of weakness or light headedness despite BP fluctuations BP seated 176/83, standing initially 135/78 decreasing to low of 105/69; gait in room with min A unsteady with arms in guard position  Therapy Documentation Precautions:  Precautions Precautions: Fall Precaution Comments: ORTHOSTATIC, got TEDS in mid session Restrictions Weight Bearing Restrictions: No   Pain: Pain Assessment Pain Assessment: No/denies pain Pain Score: 0-No painSee FIM for current functional status  Therapy/Group: Individual Therapy  [name] 10/10/2011, 3:49 PM

## 2011-10-10 NOTE — Progress Notes (Signed)
Occupational Therapy Note  Patient Details  Name: [name] MRN: [id number] Date of Birth: August 25, 1970 Today's Date: 10/10/2011  Time: 1330-1415 Pt denies pain Individual Therapy  Pt completed LB bathing and dressing tasks not completed from AM therapy session.  Pt completed all tasks with supervision/setup.  Pt transitioned to gym for arm bike exercises (4 min X 2).  Pt did not c/o of any light headedness or feeling drowsy throughout session.  Focus on activity tolerance, safety awarenss, and standing balance.   Leotis Shames [facility] 10/10/2011, 3:52 PM

## 2011-10-10 NOTE — Progress Notes (Signed)
Physical Therapy Session Note  Patient Details  Name: [name] MRN: [id number] Date of Birth: [birth year]  Today's Date: 10/10/2011 Time: 1005-1102 Time Calculation (min): 57 min  Short Term Goals: No short term goals set  Skilled Therapeutic Interventions/Progress Updates:    Pt had been unable to complete OT session d/t low BP earlier but had gone back to bed and nurse reported BP systolic had increased to 170+.  Therpeutic activity- pt toileted and stood for BP monitoring BP 119/75 seated, initially stadning 91/61- then performed marching and squats and TED hose were on and BP decreased to 78/52. Pt denied light headedness but c/o feeling weak.  Nurse informed and  Felt pt would be able to participate in seated activity. There-ex (nu step and wii boxing ) for cardiovascular exercise to prevent loss while pt having BP issues, pt fatigues easily. Pt also encouraged to drink by nurse and drank two cranberry juices during PT.  Therapy Documentation Precautions:  Precautions Precautions: Fall Precaution Comments: ORTHOSTATIC, got TEDS in mid session Restrictions Weight Bearing Restrictions: No :   Vital Signs:)2 96%, HR 78, thready Pain: Pain Assessment Pain Assessment: No/denies pain Mobility:min A for transfers for safety d/t decreased BP   Locomotion :not able d/t BP         Exercises: Cardiovascular Exercises NuStep: 5 min x 2 MET 2-2.3 Other Exercises Other Exercises: seated trunk unsupported Wii boxing Other Treatments:    See FIM for current functional status  Therapy/Group: Individual Therapy  [name] 10/10/2011, 12:07 PM

## 2011-10-11 ENCOUNTER — Inpatient Hospital Stay (HOSPITAL_COMMUNITY): Payer: Medicaid Other

## 2011-10-11 LAB — GLUCOSE, CAPILLARY
Glucose-Capillary: 78 mg/dL (ref 70–99)
Glucose-Capillary: 95 mg/dL (ref 70–99)
Glucose-Capillary: 118 mg/dL — ABNORMAL HIGH (ref 70–99)

## 2011-10-11 MED ORDER — HEPARIN SODIUM (PORCINE) 1000 UNIT/ML DIALYSIS
20.0000 [IU]/kg | INTRAMUSCULAR | Status: DC | PRN
  Administered 2011-10-16: 1200 [IU] via INTRAVENOUS_CENTRAL
  Filled 2011-10-11: qty 2

## 2011-10-11 MED ORDER — ONDANSETRON HCL 4 MG PO TABS
4.0000 mg | ORAL_TABLET | Freq: Three times a day (TID) | ORAL | Status: DC | PRN
  Administered 2011-10-13 – 2011-10-16 (×3): 4 mg via ORAL
  Filled 2011-10-11 (×5): qty 1

## 2011-10-11 MED ORDER — CLONIDINE HCL 0.1 MG/24HR TD PTWK
0.1000 mg | MEDICATED_PATCH | TRANSDERMAL | Status: DC
  Administered 2011-10-11: 0.1 mg via TRANSDERMAL
  Filled 2011-10-11: qty 1

## 2011-10-11 MED ORDER — ONDANSETRON HCL 4 MG/2ML IJ SOLN
4.0000 mg | Freq: Three times a day (TID) | INTRAMUSCULAR | Status: DC | PRN
  Administered 2011-10-11: 4 mg via INTRAMUSCULAR
  Administered 2011-10-16: 19:00:00 via INTRAMUSCULAR
  Filled 2011-10-11: qty 2

## 2011-10-11 NOTE — Progress Notes (Signed)
Occupational Therapy Note  Patient Details  Name: [name] MRN: [id number] Date of Birth: [birth year] Today's Date: 10/11/2011  Time: [id number] Pt denies pain Individual Therapy  Pt engaged in bathing and dressing tasks with sit to stand from recliner in room.  Pt completed grooming tasks while standing at sink. RN and pt communicated that prior to therapy pt had been experiencing fluctuating BP (high and low) accompanied by nausea and vomiting.  Pt Bp during session = 143/81, 149/79, and 144/83 with no complaints of light headedness or nausea.  Pt completed all tasks with supervision and extra time for rest breaks.    Leotis Shames [facility] 10/11/2011, 12:52 PM

## 2011-10-11 NOTE — Plan of Care (Signed)
Problem: RH KNOWLEDGE DEFICIT Goal: RH STG INCREASE KNOWLEDGE OF DIABETES Able to verbalize understanding of diabetes management and monitoring independently  Outcome: Progressing Pt checks blood glucose at home TID. Pt on Humlog 75/25 insulin at home.

## 2011-10-11 NOTE — Progress Notes (Signed)
[facility] ROUNDING NOTE   Subjective:   Interval History: none.  Objective:  Vital signs in last 24 hours:  Temp:  [97 F (36.1 C)-98.4 F (36.9 C)] 98.3 F (36.8 C) (04/26 0500) Pulse Rate:  [71-88] 81  (04/26 0500) Resp:  [14-18] 16  (04/26 0500) BP: (136-224)/(72-100) 137/79 mmHg (04/26 0500) SpO2:  [94 %-100 %] 96 % (04/26 0500) Weight:  [60.5 kg (133 lb 6.1 oz)-63.6 kg (140 lb 3.4 oz)] 60.5 kg (133 lb 6.1 oz) (04/25 1854)  Weight change: 1.3 kg (2 lb 13.9 oz) Filed Weights   10/10/11 0537 10/10/11 1442 10/10/11 1854  Weight: 62.3 kg (137 lb 5.6 oz) 63.6 kg (140 lb 3.4 oz) 60.5 kg (133 lb 6.1 oz)    Intake/Output: I/O last 3 completed shifts: In: 600 [P.O.:600] Out: -3100    Intake/Output this shift:     CVS- RRR RS- CTA ABD- BS present soft non-distended EXT- no edema   Basic Metabolic Panel:  Lab [id number] 1448 10/08/11 0807 10/07/11 0605  NA 133* 135 137  K 3.7 3.8 3.8  CL 96 98 99  CO2 26 29 29   GLUCOSE 124* 86 60*  BUN 22 25* 19  CREATININE 4.49* 5.18* 4.41*  CALCIUM 8.8 8.6 8.6  MG -- -- --  PHOS 4.4 5.2* 4.7*    Liver Function Tests:  Lab 10/10/11 1448 10/07/11 0605  AST -- --  ALT -- --  ALKPHOS -- --  BILITOT -- --  PROT -- --  ALBUMIN 2.4* 2.1*   No results found for this basename: LIPASE:5,AMYLASE:5 in the last 168 hours No results found for this basename: AMMONIA:3 in the last 168 hours  CBC:  Lab 10/10/11 1448 10/08/11 0807 10/07/11 0605  WBC 16.7* 11.3* 9.5  NEUTROABS -- -- --  HGB 11.1* 7.5* 7.7*  HCT 32.3* 23.3* 23.9*  MCV 84.3 81.5 81.6  PLT 180 332 307    Cardiac Enzymes: No results found for this basename: CKTOTAL:5,CKMB:5,CKMBINDEX:5,TROPONINI:5 in the last 168 hours  BNP: No components found with this basename: POCBNP:5  CBG:  Lab 10/11/11 0701 10/10/11 2105 10/10/11 1905 10/10/11 1128 10/10/11 0720  GLUCAP 78 171* 102* 185* 112*    Microbiology: Results for orders placed during the  hospital encounter of 10/08/11  MRSA PCR SCREENING     Status: Normal   Collection Time   10/08/11 11:04 PM      Component Value Range Status Comment   MRSA by PCR NEGATIVE  NEGATIVE  Final     Coagulation Studies: No results found for this basename: LABPROT:5,INR:5 in the last 72 hours  Urinalysis: No results found for this basename: COLORURINE:2,APPERANCEUR:2,LABSPEC:2,PHURINE:2,GLUCOSEU:2,HGBUR:2,BILIRUBINUR:2,KETONESUR:2,PROTEINUR:2,UROBILINOGEN:2,NITRITE:2,LEUKOCYTESUR:2 in the last 72 hours    Imaging: No results found.   Medications:        . cloNIDine  0.1 mg Transdermal Weekly  . hydrALAZINE  50 mg Oral QID  . insulin aspart  0-5 Units Subcutaneous QHS  . insulin glargine  5 Units Subcutaneous QHS  . levofloxacin  500 mg Oral Q48H  . multivitamin  1 tablet Oral Daily  . nebivolol  20 mg Oral Daily  . DISCONTD: cloNIDine  0.2 mg Oral TID   acetaminophen, feeding supplement, ondansetron (ZOFRAN) IV, ondansetron, ondansetron, oxyCODONE, polyethylene glycol, sorbitol  Assessment/ Plan:  1. ESRD new start TTS 2. vasc access- appreciate VVS assistance.Steal syndrome will need plan as outpatient 3. Anemia of chronic disease- on EPO/iron drop today, contacted primary team 4. HTN- Amlodipine stable 5. Disposition [facility]  TTS but very weak and not quite ready for discharge in rehab  LOS: 3 Saurav Crumble W @TODAY @9 :30 AM

## 2011-10-11 NOTE — Progress Notes (Signed)
Physical Therapy Session Note  Patient Details  Name: [name] MRN: [id number] Date of Birth: 01/15/1971  Today's Date: 10/11/2011 Time: 0730-0830 (60 min total)    Short Term Goals: Week 1:  PT Short Term Goal 1 (Week 1): same as LTGs  Skilled Therapeutic Interventions/Progress Updates:    BP highly uncontrolled throughout session. Started with sitting LE exercises as tolerated: manually resisted long arc quads, marching, heel raises, toe raises, hip abduction/adduction. Attempted static balance activities and gait in room but pt only able to ambulate to wheelchair, improved with rest. While being pushed to gym pt became dizzy again and vomited x 1 episode, BP elevated (see chart below). Pt left in recliner with improved BP. Overall pt moving at supervision/min-guard assist just very limited by BP today.   Orthostatic BPs  Supine 183/75  Sitting 105/68  Sitting after 1 then 2 min 91/60; 116/73  Sitting in wheelchair, post vomiting then post rest  203/84; 152/81  Sitting in recliner post transfer 134/80   Second Session: TIME: 1101- 1145 (44 min total) Much improved BP tolerance. Gait training x 45' in home environment practicing obstacle negotiation, backwards walking, turning in tight spaces,Min-guard assist. NuStep Level 4 x  2 sets of 5 continuous minutes with seated rest in between sets. Performed 4 steps with min-guard assist, utilized Lt. Railing. Standing balance activities with throwing/catching ball with normal stance, narrow stance, modified tandem, min assist for 3 losses of balance. Continues to need rest between all activities. BP 182/78 at start of treatment, 148/81 post NuStep, 127/77 post balance activities.    Therapy Documentation Precautions:  Precautions Precautions: Fall Precaution Comments: orthostatic at times Restrictions Weight Bearing Restrictions: No  Vital Signs: Therapy Vitals Temp: 98.3 F (36.8 C) Temp src: Oral Pulse Rate: 81  Resp: 16    BP: 137/79 mmHg Patient Position, if appropriate: Lying Oxygen Therapy SpO2: 96 % Pain:  No c/o pain throughout both sessions   See FIM for current functional status  Therapy/Group: Individual Therapy  Lahoma Rocker 10/11/2011, 8:11 AM

## 2011-10-11 NOTE — Plan of Care (Signed)
Problem: RH BOWEL ELIMINATION Goal: RH STG MANAGE BOWEL W/EQUIPMENT W/ASSISTANCE STG Manage Bowel With Equipment With Assistance .independent  Outcome: Progressing Wheelchair to BR

## 2011-10-11 NOTE — Plan of Care (Signed)
Problem: RH KNOWLEDGE DEFICIT Goal: RH STG INCREASE KNOWLEDGE OF HYPERTENSION independently  Outcome: Progressing Pt aware of medications on for blood pressure control and change in medication made today, added Catapress patch vs oral route

## 2011-10-11 NOTE — Plan of Care (Signed)
Problem: RH BOWEL ELIMINATION Goal: RH STG MANAGE BOWEL W/MEDICATION W/ASSISTANCE STG Manage Bowel with Medication with Assistance.independent  Outcome: Progressing Not needed today last BM on 10-10-11 after 2 doses of soribitol

## 2011-10-11 NOTE — Progress Notes (Signed)
Social Work  Social Work Assessment and Plan  Patient Details  Name: [name] MRN: [id number] Date of Birth: 30-Nov-1970  Today's Date: 10/11/2011  Problem List:  Patient Active Problem List  Diagnoses  . DIABETES MELLITUS II, UNCOMPLICATED  . HYPERLIPIDEMIA  . GOUT  . OBESITY, NOS  . HYPERTENSION, BENIGN SYSTEMIC  . FIBROADENOSIS, BREAST  . Irregular Menstrual Cycle  . VOMITING  . HTN (hypertension), malignant  . Chronic kidney disease, stage 4, severely decreased GFR  . PNA (pneumonia)  . Acute respiratory failure with hypoxia  . Physical deconditioning   Past Medical History:  Past Medical History  Diagnosis Date  . Chronic kidney disease   . Hypertension   . Diabetes mellitus   . Retinopathy   . Metabolic bone disease   . Anemia   . CHF (congestive heart failure)    Past Surgical History:  Past Surgical History  Procedure Date  . Insertion of dialysis catheter 10/04/2011    Procedure: INSERTION OF DIALYSIS CATHETER;  Surgeon: Angelia Mould, MD;  Location: [facility];  Service: Vascular;  Laterality: Right;  insertion of dialysis catheter right internal jugular  . Av fistula placement 10/04/2011    Procedure: INSERTION OF ARTERIOVENOUS (AV) GORE-TEX GRAFT ARM;  Surgeon: Angelia Mould, MD;  Location: [facility];  Service: Vascular;  Laterality: Left;  Insertion left upper arm Arteriovenous goretex graft  . Avgg removal 10/04/2011    Procedure: REMOVAL OF ARTERIOVENOUS GORETEX GRAFT ([place]);  Surgeon: Elam Dutch, MD;  Location: [facility];  Service: Vascular;  Laterality: Left;   Social History:  reports that she has never smoked. She does not have any smokeless tobacco history on file. She reports that she does not drink alcohol or use illicit drugs.  Family / Support Systems Marital Status: Single Patient Roles: Other (Comment) (employee; only child) Spouse/Significant Other: NA Children: NA Other Supports: pt's parents to be primary supports - other  supports include godmother, close friend Freda Munro) and church members/ "family"  Anticipated Caregiver: Mother, Izora Gala @ [phone] or (C) [phone] and father, Zudora Schiferl Ability/Limitations of Caregiver: pt denies any limitations for parents but notes "...but my Dad couldn't lift me" Caregiver Availability: 24/7 (parents both retired) Family Dynamics: pt describes parents as extremely supportive - no stressors within dynamics  Social History Preferred language: English Religion: Non-Denominational Cultural Background: NA Education: Completed 2 years at [employer] plus other schools including [employer] (online) and Chartered certified accountant - was currently enrolled with [employer] (online) with plans to graduate Aug 2013 and plans now "on hold" Read: Yes Write: Yes Employment Status: Employed Name of Employer: 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  Speaks easily and openly with this SW about her frustrations with so many medical issues and having to take many medications, making her "feel like a drug addict".  Admits that she has become tearful at times during this hospitalization due to her multiple med issues and now having to start HD.  She does report  that she feels comfortable speaking with her parents, friends and church members ("anyone who will listen") about her emotional "ups and downs".  Formal depression screen completed with  score of "1".  No formal psychiatric intervention indicated at this time, however, this SW will follow for emotional support and will monitor. Recent Psychosocial Issues: Lost a job in 2011 and was then unable to afford her medications for approx 6 months which she believes is, ultimately, what led to medical crisis and hospitalization in Oct.2012.  She had to stay with her parents after that hospital d/c.  She has also, recently , been placed on list to start the process of being placed on transplant list. Pyschiatric History: None Substance Abuse History: None  Patient / Family Perceptions, Expectations & Goals Pt/Family understanding of illness & functional limitations: Pt able to offer detailed report of her medical issues/ course over past several months.  She is able to associate her current functional limitations with her medical fragility with DM and CKD Premorbid pt/family roles/activities: Pt was living alone PTA and working at a new job - living independently, however, parents would provide support as needed Anticipated changes in roles/activities/participation: parents will likely need to assume caregiver roles with pt as pt plans to d/c to their home Pt/family expectations/goals: "I just wish I didn't have to take all the meds...that's it...be able to get back to what I was doing"  [employer]: Other (Comment) ([facility]; new HD at [facility]) Premorbid Home Care/DME Agencies: None Transportation available at discharge: yes Resource referrals recommended: Support group (specify);Other (Comment) (HD support group; ?VR if return to work is difficult)  Discharge Planning Living Arrangements: Alone Support Systems: Parent;Church/faith community;Friends/neighbors Type of Residence: Private residence Insurance Resources: Medicaid (specify county) Sports coach co) Museum/gallery curator Resources: Family Support Financial Screen Referred: No Living Expenses:  Education officer, community Management: Patient Do you have any problems obtaining your medications?: No Home Management: patient Patient/Family Preliminary Plans: Pt to d/c home with parents until she is able to return to her own home Barriers to Discharge: Steps Social Work Anticipated Follow Up Needs: HH/OP;Support Group Expected length of stay: 10-14 days  Clinical Impression Unfortunate young woman here after medical crisis and now new HD patient.  Very supportive parents and they are able to provide 24/7 assistance.  Medical issues continue which are causing patient some emotional distress, therefore, will monitor closing with team and provide emotional support.  Thresia Ramanathan  Joedy Eickhoff, [place] 10/11/2011, 8:52 AM

## 2011-10-11 NOTE — Progress Notes (Signed)
Occupational Therapy Note  Patient Details  Name: [name] MRN: [id number] Date of Birth: 03-Sep-1970 Today's Date: 10/11/2011  Time: 1300-1330 Pt denies pain Individual therapy  Pt engaged in home mgmt tasks in ADL apartment including making the bed, retrieving clothing items, and carrying items to bathroom.  Pt performed all tasks without AD and at supervision level.  LOB X 2 but able to self correct.  Pt transitioned to gym for arm bike exercises (4 min X 2).  Focus on activity tolerance, home mgmt tasks, and balance.   Leotis Shames [facility] 10/11/2011, 2:34 PM

## 2011-10-11 NOTE — Progress Notes (Signed)
Patient ID: [name], female   DOB: 01-05-1971, 41 y.o.   MRN: [id number] Patient ID: [name], female   DOB: 1970-07-31, 41 y.o.   MRN: [id number] Subjective/Complaints: Review of Systems  Respiratory: Positive for cough.   Neurological: Positive for dizziness and weakness.  All other systems reviewed and are negative.  Became nauseas and dizzy when BP rose this AM.  Caused her to vomit.   Objective: Vital Signs: Blood pressure 137/79, pulse 81, temperature 98.3 F (36.8 C), temperature source Oral, resp. rate 16, height 5\' 2"  (1.575 m), weight 60.5 kg (133 lb 6.1 oz), last menstrual period 09/16/2011, SpO2 96.00%. No results found.  Basename 10/10/11 1448  WBC 16.7*  HGB 11.1*  HCT 32.3*  PLT 180    Basename 10/10/11 1448  NA 133*  K 3.7  CL 96  CO2 26  GLUCOSE 124*  BUN 22  CREATININE 4.49*  CALCIUM 8.8   CBG (last 3)   Basename 10/11/11 0701 10/10/11 2105 10/10/11 1905  GLUCAP 78 171* 102*    Wt Readings from Last 3 Encounters:  10/10/11 60.5 kg (133 lb 6.1 oz)  10/08/11 57.9 kg (127 lb 10.3 oz)  10/08/11 57.9 kg (127 lb 10.3 oz)    Physical Exam:  General appearance: alert, cooperative and no distress Head: Normocephalic, without obvious abnormality, atraumatic Eyes: conjunctivae/corneas clear. PERRL, EOM's intact. Fundi benign. Ears: normal TM's and external ear canals both ears Nose: Nares normal. Septum midline. Mucosa normal. No drainage or sinus tenderness. Throat: lips, mucosa, and tongue normal; teeth and gums normal Neck: no adenopathy, no carotid bruit, no JVD, supple, symmetrical, trachea midline and thyroid not enlarged, symmetric, no tenderness/mass/nodules Back: symmetric, no curvature. ROM normal. No CVA tenderness. Resp: clear to auscultation bilaterally Cardio: regular rate and rhythm, S1, S2 normal, no murmur, click, rub or gallop GI: soft, non-tender; bowel sounds normal; no masses,  no organomegaly Extremities: extremities  normal, atraumatic, no cyanosis or edema Pulses: 2+ and symmetric Skin: Skin color, texture, turgor normal. No rashes or lesions Neurologic: Grossly normal-generalized trunk and proximal ext weakness. Cognitively intact. CN norml Incision/Wound: LUE incisions clean. Chest sites clean.   Assessment/Plan: 1. Functional deficits secondary to deconditioning after pneumonia/respiratory failure/ acute on chronic renal failure which require 3+ hours per day of interdisciplinary therapy in a comprehensive inpatient rehab setting. Physiatrist is providing close team supervision and 24 hour management of active medical problems listed below. Physiatrist and rehab team continue to assess barriers to discharge/monitor patient progress toward functional and medical goals. FIM: FIM - Bathing Bathing Steps Patient Completed: Front perineal area;Buttocks;Right upper leg;Left upper leg Bathing: 5: Supervision: Safety issues/verbal cues (LB bathing only)  FIM - Upper Body Dressing/Undressing Upper body dressing/undressing steps patient completed: Thread/unthread right bra strap;Thread/unthread left bra strap;Hook/unhook bra;Thread/unthread right sleeve of pullover shirt/dresss;Thread/unthread left sleeve of pullover shirt/dress;Put head through opening of pull over shirt/dress;Pull shirt over trunk Upper body dressing/undressing: 5: Supervision: Safety issues/verbal cues FIM - Lower Body Dressing/Undressing Lower body dressing/undressing steps patient completed: Thread/unthread right underwear leg;Thread/unthread left underwear leg;Pull underwear up/down;Pull pants up/down;Thread/unthread left pants leg;Thread/unthread right pants leg;Don/Doff right sock;Don/Doff left sock Lower body dressing/undressing: 5: Set-up assist to: Obtain clothing  FIM - Toileting Toileting steps completed by patient: Adjust clothing prior to toileting;Performs perineal hygiene;Adjust clothing after toileting Toileting Assistive  Devices: Grab bar or rail for support Toileting: 4: Steadying assist  FIM - Radio producer Devices: Bedside commode;Grab bars Toilet Transfers: 4-To toilet/BSC: Min A (steadying Pt. >  75%)  FIM - Bed/Chair Transfer Bed/Chair Transfer Assistive Devices: Arm rests Bed/Chair Transfer: 5: Supine > Sit: Supervision (verbal cues/safety issues);4: Bed > Chair or W/C: Min A (steadying Pt. > 75%)  FIM - Locomotion: Wheelchair Locomotion: Wheelchair: 1: Total Assistance/staff pushes wheelchair (Pt<25%) FIM - Locomotion: Ambulation Locomotion: Ambulation Assistive Devices: Other (comment) (HHA) Ambulation/Gait Assistance: 3: Mod assist Locomotion: Ambulation: 0: Activity did not occur (unable d/t low BP)  Comprehension Comprehension Mode: Auditory Comprehension: 7-Follows complex conversation/direction: With no assist  Expression Expression Mode: Verbal Expression: 7-Expresses complex ideas: With no assist  Social Interaction Social Interaction: 7-Interacts appropriately with others - No medications needed.  Problem Solving Problem Solving: 7-Solves complex problems: Recognizes & self-corrects  Memory Memory: 7-Complete Independence: No helper  1. Deconditioning after pneumonia with severe chronic kidney disease  2. DVT Prophylaxis/Anticoagulation: SCDs. Monitor for any signs of deep vein thrombosis  3. ID/pneumonia. Completes Levaquin 10/15/11. Followup chest x-ray today given leukocytosis. Check oxygen saturation every shift.  4. End-stage renal disease. Patient with dialysis initiated during this hospital stay. Weigh patient daily. Vascular surgery to followup for permanent access. Following volumes also.   -removed staples 5. Hypertension. Change catapres to patch tosee if this helps with rebounding bp's, hydralazine 50 mg 4 times a day, bystolic 20 mg daily.    -dizzy when volume drops (ie with diarrhea) or with bp spikes  -encouraged adequate  dietary/fluid intake   6. Diabetes mellitus. Hemoglobin A1c 6.1. Lantus insulin 5 units each bedtime. Probably can drop hs lantus.  Check blood sugars a.c. and at bedtime- fair/borderline control at present. Latest blood sugar 78 this a.m. 7. Chronic anemia hemoglobin 7.5-9.3. Followup labs with hemodialysis/transfused 2 units 10/08/2011- epo/iron per cka 8. Diarrhea- laxative effect.   -adequate fluids  -backd off laxatives a bit.  -will check c diff specimen if she has another loose stool given leukocytosis.   LOS (Days) 3 A FACE TO FACE EVALUATION WAS PERFORMED  ANGIULLI,DANIEL J. 10/11/2011, 9:22 AM

## 2011-10-11 NOTE — Plan of Care (Signed)
Problem: RH PAIN MANAGEMENT Goal: RH OTHER STG PAIN MANAGEMENT GOALS W/ASSIST Other STG Pain Management Goals With Assistance. Min assist  Outcome: Progressing No c/o of pain

## 2011-10-12 ENCOUNTER — Inpatient Hospital Stay (HOSPITAL_COMMUNITY): Payer: Medicaid Other

## 2011-10-12 LAB — CBC
HCT: 33.5 % — ABNORMAL LOW (ref 36.0–46.0)
Hemoglobin: 11.4 g/dL — ABNORMAL LOW (ref 12.0–15.0)
MCH: 29 pg (ref 26.0–34.0)
MCHC: 34 g/dL (ref 30.0–36.0)
MCV: 85.2 fL (ref 78.0–100.0)
Platelets: 170 10*3/uL (ref 150–400)
RBC: 3.93 MIL/uL (ref 3.87–5.11)
RDW: 15.6 % — ABNORMAL HIGH (ref 11.5–15.5)
WBC: 15 10*3/uL — ABNORMAL HIGH (ref 4.0–10.5)

## 2011-10-12 LAB — RENAL FUNCTION PANEL
Albumin: 2.7 g/dL — ABNORMAL LOW (ref 3.5–5.2)
BUN: 19 mg/dL (ref 6–23)
CO2: 28 mEq/L (ref 19–32)
Calcium: 9.2 mg/dL (ref 8.4–10.5)
Chloride: 96 mEq/L (ref 96–112)
Creatinine, Ser: 4.72 mg/dL — ABNORMAL HIGH (ref 0.50–1.10)
GFR calc Af Amer: 12 mL/min — ABNORMAL LOW (ref >90)
GFR calc non Af Amer: 10 mL/min — ABNORMAL LOW (ref >90)
Glucose, Bld: 118 mg/dL — ABNORMAL HIGH (ref 70–99)
Phosphorus: 4.1 mg/dL (ref 2.3–4.6)
Potassium: 3.9 mEq/L (ref 3.5–5.1)
Sodium: 134 mEq/L — ABNORMAL LOW (ref 135–145)

## 2011-10-12 LAB — GLUCOSE, CAPILLARY
Glucose-Capillary: 75 mg/dL (ref 70–99)
Glucose-Capillary: 95 mg/dL (ref 70–99)
Glucose-Capillary: 80 mg/dL (ref 70–99)
Glucose-Capillary: 126 mg/dL — ABNORMAL HIGH (ref 70–99)

## 2011-10-12 NOTE — Progress Notes (Signed)
Orthopedic Tech Progress Note Patient Details:  [name] [birth year] [id number]  Other Ortho Devices Type of Ortho Device: Other (comment) Ortho Device Interventions: Application Delivered abdominal binder to pt room.  [name] T 10/12/2011, 10:14 AM

## 2011-10-12 NOTE — Progress Notes (Signed)
Physical Therapy Note  Patient Details  Name: [name] MRN: [id number] Date of Birth: 07/30/1970 Today's Date: 10/12/2011  1000-1055 (55 minutes) group Pain: no complaint of pain Pt participated in PT group focusing on bilateral UE general strengthening and endurance; transfers SPT SBA ; NuStep (ergonometer) Level 3 X 10 minutes - perceived exertion =  13 somewhat hard with one rest break;  UE strengthening using red elastic bands 2 X 15 for elbow extension, shoulder depression, biceps curls, scapular adduction. Angellica Maddison,JIM 10/12/2011, 9:55 AM

## 2011-10-12 NOTE — Progress Notes (Signed)
[facility] ROUNDING NOTE   Subjective:   Interval History: none.  Objective:  Vital signs in last 24 hours:  Temp:  [98.1 F (36.7 C)-98.5 F (36.9 C)] 98.5 F (36.9 C) (04/27 0500) Pulse Rate:  [79-84] 84  (04/27 0500) Resp:  [16-18] 16  (04/27 0500) BP: (145-174)/(70-85) 146/72 mmHg (04/27 0500) SpO2:  [96 %-97 %] 96 % (04/27 0500)  Weight change:  Filed Weights   10/10/11 0537 10/10/11 1442 10/10/11 1854  Weight: 62.3 kg (137 lb 5.6 oz) 63.6 kg (140 lb 3.4 oz) 60.5 kg (133 lb 6.1 oz)    Intake/Output: I/O last 3 completed shifts: In: 480 [P.O.:480] Out: -    Intake/Output this shift:  Total I/O In: 240 [P.O.:240] Out: -   CVS- RRR IJ RS- CTA ABD- BS present soft non-distended EXT- no edema   Basic Metabolic Panel:  Lab [id number] 1448 10/08/11 0807 10/07/11 0605  NA 133* 135 137  K 3.7 3.8 3.8  CL 96 98 99  CO2 26 29 29   GLUCOSE 124* 86 60*  BUN 22 25* 19  CREATININE 4.49* 5.18* 4.41*  CALCIUM 8.8 8.6 8.6  MG -- -- --  PHOS 4.4 5.2* 4.7*    Liver Function Tests:  Lab 10/10/11 1448 10/07/11 0605  AST -- --  ALT -- --  ALKPHOS -- --  BILITOT -- --  PROT -- --  ALBUMIN 2.4* 2.1*   No results found for this basename: LIPASE:5,AMYLASE:5 in the last 168 hours No results found for this basename: AMMONIA:3 in the last 168 hours  CBC:  Lab 10/10/11 1448 10/08/11 0807 10/07/11 0605  WBC 16.7* 11.3* 9.5  NEUTROABS -- -- --  HGB 11.1* 7.5* 7.7*  HCT 32.3* 23.3* 23.9*  MCV 84.3 81.5 81.6  PLT 180 332 307    Cardiac Enzymes: No results found for this basename: CKTOTAL:5,CKMB:5,CKMBINDEX:5,TROPONINI:5 in the last 168 hours  BNP: No components found with this basename: POCBNP:5  CBG:  Lab 10/12/11 1132 10/12/11 0738 10/11/11 1611 10/11/11 1143 10/11/11 0701  GLUCAP 95 75 118* 95 78    Microbiology: Results for orders placed during the hospital encounter of 10/08/11  MRSA PCR SCREENING     Status: Normal   Collection Time     10/08/11 11:04 PM      Component Value Range Status Comment   MRSA by PCR NEGATIVE  NEGATIVE  Final     Coagulation Studies: No results found for this basename: LABPROT:5,INR:5 in the last 72 hours  Urinalysis: No results found for this basename: COLORURINE:2,APPERANCEUR:2,LABSPEC:2,PHURINE:2,GLUCOSEU:2,HGBUR:2,BILIRUBINUR:2,KETONESUR:2,PROTEINUR:2,UROBILINOGEN:2,NITRITE:2,LEUKOCYTESUR:2 in the last 72 hours    Imaging: Dg Chest 2 View  10/11/2011  *RADIOLOGY REPORT*  Clinical Data: Cough and leukocytosis.  Recent pneumonia  CHEST - 2 VIEW  Comparison: 10/04/2011  Findings: A dual lumen catheter is in place via the right internal jugular vein and is stable in position. Heart size is mildly enlarged and stable and a normal mediastinal contour is seen.  The lung fields appear clear with no signs of focal infiltrate or congestive failure.  No pleural fluid or significant peribronchial cuffing is identified.  Bony structures appear intact.  IMPRESSION: Stable cardiopulmonary appearance with no new focal or acute abnormality identified.  Original Report Authenticated By: Ander Gaster, M.D.     Medications:        . cloNIDine  0.1 mg Transdermal Weekly  . hydrALAZINE  50 mg Oral QID  . insulin aspart  0-5 Units Subcutaneous QHS  . insulin glargine  5 Units Subcutaneous QHS  . levofloxacin  500 mg Oral Q48H  . multivitamin  1 tablet Oral Daily  . nebivolol  20 mg Oral Daily   acetaminophen, feeding supplement, heparin, ondansetron (ZOFRAN) IV, ondansetron, ondansetron, oxyCODONE, polyethylene glycol, sorbitol  Assessment/ Plan:  1. ESRD new start TTS 2. vasc access- appreciate VVS assistance.Steal syndrome will need plan as outpatient 3. Anemia of chronic disease-status transfusion stable 4. HTN- Amlodipine stable 5. Disposition [facility] TTS but very weak and not quite ready for discharge in rehab   LOS: 4 Roald Lukacs W @TODAY @12 :32 PM

## 2011-10-12 NOTE — Progress Notes (Signed)
Patient ID: [name], female   DOB: [birth year], 41 y.o.   MRN: [id number] Patient ID: [name], female   DOB: 01/01/1971, 41 y.o.   MRN: [id number] Patient ID: [name], female   DOB: Dec 14, 1970, 41 y.o.   MRN: [id number] Subjective/Complaints: Review of Systems  Respiratory: Positive for cough.   Neurological: Positive for dizziness and weakness.  All other systems reviewed and are negative.  orthostasis with PT this am.  bp remains a little volatile. High numbers appear better with the clonidine patch  Objective: Vital Signs: Blood pressure 146/72, pulse 84, temperature 98.5 F (36.9 C), temperature source Oral, resp. rate 16, height 5\' 2"  (1.575 m), weight 60.5 kg (133 lb 6.1 oz), last menstrual period 09/16/2011, SpO2 96.00%. Dg Chest 2 View  10/11/2011  *RADIOLOGY REPORT*  Clinical Data: Cough and leukocytosis.  Recent pneumonia  CHEST - 2 VIEW  Comparison: 10/04/2011  Findings: A dual lumen catheter is in place via the right internal jugular vein and is stable in position. Heart size is mildly enlarged and stable and a normal mediastinal contour is seen.  The lung fields appear clear with no signs of focal infiltrate or congestive failure.  No pleural fluid or significant peribronchial cuffing is identified.  Bony structures appear intact.  IMPRESSION: Stable cardiopulmonary appearance with no new focal or acute abnormality identified.  Original Report Authenticated By: Ander Gaster, M.D.    Basename 10/10/11 1448  WBC 16.7*  HGB 11.1*  HCT 32.3*  PLT 180    Basename 10/10/11 1448  NA 133*  K 3.7  CL 96  CO2 26  GLUCOSE 124*  BUN 22  CREATININE 4.49*  CALCIUM 8.8   CBG (last 3)   Basename 10/12/11 0738 10/11/11 1611 10/11/11 1143  GLUCAP 75 118* 95    Wt Readings from Last 3 Encounters:  10/10/11 60.5 kg (133 lb 6.1 oz)  10/08/11 57.9 kg (127 lb 10.3 oz)  10/08/11 57.9 kg (127 lb 10.3 oz)    Physical Exam:  General appearance: alert, cooperative  and no distress Head: Normocephalic, without obvious abnormality, atraumatic Eyes: conjunctivae/corneas clear. PERRL, EOM's intact. Fundi benign. Ears: normal TM's and external ear canals both ears Nose: Nares normal. Septum midline. Mucosa normal. No drainage or sinus tenderness. Throat: lips, mucosa, and tongue normal; teeth and gums normal Neck: no adenopathy, no carotid bruit, no JVD, supple, symmetrical, trachea midline and thyroid not enlarged, symmetric, no tenderness/mass/nodules Back: symmetric, no curvature. ROM normal. No CVA tenderness. Resp: clear to auscultation bilaterally Cardio: regular rate and rhythm, S1, S2 normal, no murmur, click, rub or gallop GI: soft, non-tender; bowel sounds normal; no masses,  no organomegaly Extremities: extremities normal, atraumatic, no cyanosis or edema Pulses: 2+ and symmetric Skin: Skin color, texture, turgor normal. No rashes or lesions Neurologic: Grossly normal-generalized trunk and proximal ext weakness. Cognitively intact. CN norml Incision/Wound: LUE incisions clean. Chest sites clean.   Assessment/Plan: 1. Functional deficits secondary to deconditioning after pneumonia/respiratory failure/ acute on chronic renal failure which require 3+ hours per day of interdisciplinary therapy in a comprehensive inpatient rehab setting. Physiatrist is providing close team supervision and 24 hour management of active medical problems listed below. Physiatrist and rehab team continue to assess barriers to discharge/monitor patient progress toward functional and medical goals. FIM: FIM - Bathing Bathing Steps Patient Completed: Chest;Right Arm;Left Arm;Abdomen;Front perineal area;Buttocks;Right upper leg;Left upper leg (w/c at sink; skiped legs andfeet today) Bathing: 5: Supervision: Safety issues/verbal cues  FIM - Upper Body Dressing/Undressing Upper  body dressing/undressing steps patient completed: Thread/unthread right sleeve of pullover  shirt/dresss;Thread/unthread left sleeve of pullover shirt/dress;Put head through opening of pull over shirt/dress;Pull shirt over trunk Upper body dressing/undressing: 5: Set-up assist to: Obtain clothing/put away FIM - Lower Body Dressing/Undressing Lower body dressing/undressing steps patient completed: Thread/unthread right pants leg;Thread/unthread left pants leg;Pull pants up/down;Don/Doff right sock;Don/Doff left sock Lower body dressing/undressing: 5: Set-up assist to: Obtain clothing  FIM - Toileting Toileting steps completed by patient: Adjust clothing prior to toileting;Performs perineal hygiene;Adjust clothing after toileting Toileting Assistive Devices: Grab bar or rail for support Toileting: 4: Steadying assist  FIM - Radio producer Devices: Bedside commode;Grab bars Toilet Transfers: 4-To toilet/BSC: Min A (steadying Pt. > 75%)  FIM - Bed/Chair Transfer Bed/Chair Transfer Assistive Devices: Arm rests Bed/Chair Transfer: 4: Bed > Chair or W/C: Min A (steadying Pt. > 75%);4: Chair or W/C > Bed: Min A (steadying Pt. > 75%);5: Supine > Sit: Supervision (verbal cues/safety issues)  FIM - Locomotion: Wheelchair Locomotion: Wheelchair: 1: Total Assistance/staff pushes wheelchair (Pt<25%) FIM - Locomotion: Ambulation Locomotion: Ambulation Assistive Devices: Other (comment) (HHA) Ambulation/Gait Assistance: 4: Min guard Locomotion: Ambulation: 1: Travels less than 50 ft with minimal assistance (Pt.>75%)  Comprehension Comprehension Mode: Auditory Comprehension: 7-Follows complex conversation/direction: With no assist  Expression Expression Mode: Verbal Expression: 7-Expresses complex ideas: With no assist  Social Interaction Social Interaction: 7-Interacts appropriately with others - No medications needed.  Problem Solving Problem Solving: 7-Solves complex problems: Recognizes & self-corrects  Memory Memory: 7-Complete Independence: No  helper  1. Deconditioning after pneumonia with severe chronic kidney disease  2. DVT Prophylaxis/Anticoagulation: SCDs. Monitor for any signs of deep vein thrombosis  3. ID/pneumonia. Completes Levaquin 10/15/11. Followup chest x-ray today given leukocytosis. Check oxygen saturation every shift.  4. End-stage renal disease. Patient with dialysis initiated during this hospital stay. Weigh patient daily. Vascular surgery to followup for permanent access. Following volumes also.   -removed staples 5. Hypertension. Change catapres to patch tosee if this helps with rebounding bp's, hydralazine 50 mg 4 times a day, bystolic 20 mg daily.    -dizzy when volume drops (ie with diarrhea) or with bp spikes  -encouraged adequate dietary/fluid intake  -TTH and will add binder  -consider midodrine trial  6. Diabetes mellitus. Hemoglobin A1c 6.1. Dropped hs lantus  Check blood sugars a.c. and at bedtime- fair/borderline control at present. Follow for further trend. 7. Chronic anemia hemoglobin 7.5-9.3. Followup labs with hemodialysis/transfused 2 units 10/08/2011- epo/iron per cka 8. Diarrhea- laxative effect.   -adequate fluids  -backd off laxatives a bit.  -will check c diff specimen if she has another loose stool given leukocytosis.   LOS (Days) 4 A FACE TO FACE EVALUATION WAS PERFORMED  Nolberto Cheuvront T 10/12/2011, 9:16 AM

## 2011-10-12 NOTE — Progress Notes (Signed)
Occupational Therapy Session Note  Patient Details  Name: [name] MRN: [id number] Date of Birth: 15-Jan-1971  Today's Date: 10/12/2011 Time: 1300-1400 and [id number] TOTAL minutes =105 minutes  Skilled Therapeutic Interventions/Progress Updates:   ADL in w/c at sink with one complaint of  Slight dizziness when standing to wash periarea; othewise, patient S level.  When seen for 2nd treatment, patient with order of abdominal binder applied when up.  2nd session completed balance and functional mobility and endurance skills via stove top and microwave heating of soup; cabinet and refrigerator skills to increase endurance in preparation for patient's eventual return home alone (currently plans to go to  parents' home initially from hospital)l; also completed UE exercises and other endurance traininig seated in w.c  Therapy Documentation Precautions:  Abdominal binder when up Precautions Precautions: Fall Precaution Comments: ORTHOSTATIC, got TEDS in mid session Restrictions Weight Bearing Restrictions: No Pain: no report of    See FIM for current functional status  Therapy/Group: Individual Therapy  Alfredia Ferguson [facility] 10/12/2011, 1:48 PM

## 2011-10-12 NOTE — Progress Notes (Signed)
Physical Therapy Note  Patient Details  Name: [name] MRN: [id number] Date of Birth: 1970-11-05 Today's Date: 10/12/2011  [id number] (30 minutes) individual Pain: no complaint of pain Blood pressure  172/81 (sitting)  101/69   99/68 (standing with thigh high TED hose in place) with 1/4 compliant of dizziness in standing  Focus of treatment: SHORT distance ambulation (10 feet X 4) HHA with no increase in dizziness  Isaura Schiller,JIM 10/12/2011, 8:58 AM

## 2011-10-13 LAB — GLUCOSE, CAPILLARY
Glucose-Capillary: 73 mg/dL (ref 70–99)
Glucose-Capillary: 98 mg/dL (ref 70–99)
Glucose-Capillary: 84 mg/dL (ref 70–99)
Glucose-Capillary: 94 mg/dL (ref 70–99)

## 2011-10-13 MED ORDER — HYDRALAZINE HCL 50 MG PO TABS
50.0000 mg | ORAL_TABLET | Freq: Four times a day (QID) | ORAL | Status: DC
  Administered 2011-10-13 – 2011-10-17 (×16): 50 mg via ORAL
  Filled 2011-10-13 (×24): qty 1

## 2011-10-13 MED ORDER — CLONIDINE HCL 0.2 MG/24HR TD PTWK
0.2000 mg | MEDICATED_PATCH | TRANSDERMAL | Status: DC
  Filled 2011-10-13: qty 1

## 2011-10-13 MED ORDER — CLONIDINE HCL 0.1 MG PO TABS
0.1000 mg | ORAL_TABLET | Freq: Once | ORAL | Status: DC
  Filled 2011-10-13: qty 1

## 2011-10-13 NOTE — Progress Notes (Signed)
Physical Therapy Note  Patient Details  Name: [name] MRN: [id number] Date of Birth: 1970-07-18 Today's Date: 10/13/2011  1100-1155 (55 minutes) group Pain : no complaint of pain , mild c/o dizziness in sitting, BP (sitting) 199/88, 203/89 (nurse notified) BP (standing) 137/81 Pt participated in PT group session focusing on gait training/safety/endurance. Gait - 80 feet X 3 min to close supervison for safety secondary to fluctuating BP. (pt wearing TED hose,abdominal binder).   [name],JIM 10/13/2011, 12:45 PM

## 2011-10-13 NOTE — Progress Notes (Signed)
Patient had episode of nausea and dizziness this morning during bathing and dressing with tech. Patient wearing abdominal binder and thigh high teds. Patient blood pressure this evening was 211/77 at 1440. Dr Naaman Plummer notified. New order received. Upon recheck of patient vital once new medication was obtained blood pressure was 172/73. Dr Naaman Plummer notified. New order to hold new clonidine one time dose. Patient given scheduled hydralazine this evening. No other complaints noted. Continue with plan of care.

## 2011-10-13 NOTE — Progress Notes (Signed)
BP 161/72 and CBG 126 after HS snack.

## 2011-10-13 NOTE — Progress Notes (Signed)
[facility] ROUNDING NOTE   Subjective:   Interval History: none.  Objective:  Vital signs in last 24 hours:  Temp:  [97.8 F (36.6 C)-99 F (37.2 C)] 99 F (37.2 C) (04/28 0455) Pulse Rate:  [79-94] 82  (04/28 0826) Resp:  [15-18] 18  (04/28 0455) BP: (91-224)/(62-96) 181/77 mmHg (04/28 0826) SpO2:  [95 %-98 %] 96 % (04/28 0455) Weight:  [59.5 kg (131 lb 2.8 oz)-60.5 kg (133 lb 6.1 oz)] 59.5 kg (131 lb 2.8 oz) (04/27 2010)  Weight change:  Filed Weights   10/10/11 1854 10/12/11 1525 10/12/11 2010  Weight: 60.5 kg (133 lb 6.1 oz) 60.5 kg (133 lb 6.1 oz) 59.5 kg (131 lb 2.8 oz)    Intake/Output: I/O last 3 completed shifts: In: 720 [P.O.:720] Out: 1097 [Other:1097]   Intake/Output this shift:     CVS- RRR RS- CTA ABD- BS present soft non-distended EXT- no edema   Basic Metabolic Panel:  Lab [id number] 1530 10/10/11 1448 10/08/11 0807 10/07/11 0605  NA 134* 133* 135 137  K 3.9 3.7 3.8 3.8  CL 96 96 98 99  CO2 28 26 29 29   GLUCOSE 118* 124* 86 60*  BUN 19 22 25* 19  CREATININE 4.72* 4.49* 5.18* 4.41*  CALCIUM 9.2 8.8 8.6 --  MG -- -- -- --  PHOS 4.1 4.4 5.2* 4.7*    Liver Function Tests:  Lab 10/12/11 1530 10/10/11 1448 10/07/11 0605  AST -- -- --  ALT -- -- --  ALKPHOS -- -- --  BILITOT -- -- --  PROT -- -- --  ALBUMIN 2.7* 2.4* 2.1*   No results found for this basename: LIPASE:5,AMYLASE:5 in the last 168 hours No results found for this basename: AMMONIA:3 in the last 168 hours  CBC:  Lab 10/12/11 1532 10/10/11 1448 10/08/11 0807 10/07/11 0605  WBC 15.0* 16.7* 11.3* 9.5  NEUTROABS -- -- -- --  HGB 11.4* 11.1* 7.5* 7.7*  HCT 33.5* 32.3* 23.3* 23.9*  MCV 85.2 84.3 81.5 81.6  PLT 170 180 332 307    Cardiac Enzymes: No results found for this basename: CKTOTAL:5,CKMB:5,CKMBINDEX:5,TROPONINI:5 in the last 168 hours  BNP: No components found with this basename: POCBNP:5  CBG:  Lab 10/13/11 0734 10/12/11 2204 10/12/11 2027  10/12/11 1132 10/12/11 0738  GLUCAP 73 126* 80 95 75    Microbiology: Results for orders placed during the hospital encounter of 10/08/11  MRSA PCR SCREENING     Status: Normal   Collection Time   10/08/11 11:04 PM      Component Value Range Status Comment   MRSA by PCR NEGATIVE  NEGATIVE  Final     Coagulation Studies: No results found for this basename: LABPROT:5,INR:5 in the last 72 hours  Urinalysis: No results found for this basename: COLORURINE:2,APPERANCEUR:2,LABSPEC:2,PHURINE:2,GLUCOSEU:2,HGBUR:2,BILIRUBINUR:2,KETONESUR:2,PROTEINUR:2,UROBILINOGEN:2,NITRITE:2,LEUKOCYTESUR:2 in the last 72 hours    Imaging: Dg Chest 2 View  10/11/2011  *RADIOLOGY REPORT*  Clinical Data: Cough and leukocytosis.  Recent pneumonia  CHEST - 2 VIEW  Comparison: 10/04/2011  Findings: A dual lumen catheter is in place via the right internal jugular vein and is stable in position. Heart size is mildly enlarged and stable and a normal mediastinal contour is seen.  The lung fields appear clear with no signs of focal infiltrate or congestive failure.  No pleural fluid or significant peribronchial cuffing is identified.  Bony structures appear intact.  IMPRESSION: Stable cardiopulmonary appearance with no new focal or acute abnormality identified.  Original Report Authenticated By: Ander Gaster,  M.D.     Medications:        . cloNIDine  0.1 mg Transdermal Weekly  . hydrALAZINE  50 mg Oral QID  . insulin aspart  0-5 Units Subcutaneous QHS  . insulin glargine  5 Units Subcutaneous QHS  . levofloxacin  500 mg Oral Q48H  . multivitamin  1 tablet Oral Daily  . nebivolol  20 mg Oral Daily   acetaminophen, feeding supplement, heparin, ondansetron (ZOFRAN) IV, ondansetron, ondansetron, oxyCODONE, polyethylene glycol, sorbitol  Assessment/ Plan:  1. ESRD new start TTS 2. vasc access- appreciate VVS assistance.Steal syndrome will need plan as outpatient 3. Anemia of chronic disease-status transfusion  stable 4. HTN- Amlodipine stable 5. Disposition [facility] TTS but very weak and not quite ready for discharge in rehab   LOS: 5 Dillan Candela W @TODAY @9 :01 AM

## 2011-10-13 NOTE — Progress Notes (Signed)
Back to room from HD via bed.  Rt chest HD catheter clamped and capped.  Patient states she is "freezing".  BP 204/82; CBG 80.  Scheduled Hydralazine 50 mg po and Nebivolol 20 mg po per HD protocol given.  Continue to monitor.

## 2011-10-13 NOTE — Progress Notes (Signed)
Subjective/Complaints: Review of Systems  Respiratory: Positive for cough.   Neurological: Positive for dizziness and weakness.  All other systems reviewed and are negative.  Had CP last night during  HD which was subsequently held  Objective: Vital Signs: Blood pressure 181/77, pulse 82, temperature 99 F (37.2 C), temperature source Oral, resp. rate 18, height 5\' 2"  (1.575 m), weight 59.5 kg (131 lb 2.8 oz), last menstrual period 09/16/2011, SpO2 96.00%. Dg Chest 2 View  10/11/2011  *RADIOLOGY REPORT*  Clinical Data: Cough and leukocytosis.  Recent pneumonia  CHEST - 2 VIEW  Comparison: 10/04/2011  Findings: A dual lumen catheter is in place via the right internal jugular vein and is stable in position. Heart size is mildly enlarged and stable and a normal mediastinal contour is seen.  The lung fields appear clear with no signs of focal infiltrate or congestive failure.  No pleural fluid or significant peribronchial cuffing is identified.  Bony structures appear intact.  IMPRESSION: Stable cardiopulmonary appearance with no new focal or acute abnormality identified.  Original Report Authenticated By: Ander Gaster, M.D.    Basename 10/12/11 1532 10/10/11 1448  WBC 15.0* 16.7*  HGB 11.4* 11.1*  HCT 33.5* 32.3*  PLT 170 180    Basename 10/12/11 1530 10/10/11 1448  NA 134* 133*  K 3.9 3.7  CL 96 96  CO2 28 26  GLUCOSE 118* 124*  BUN 19 22  CREATININE 4.72* 4.49*  CALCIUM 9.2 8.8   CBG (last 3)   Basename 10/13/11 0734 10/12/11 2204 10/12/11 2027  GLUCAP 73 126* 80    Wt Readings from Last 3 Encounters:  10/12/11 59.5 kg (131 lb 2.8 oz)  10/08/11 57.9 kg (127 lb 10.3 oz)  10/08/11 57.9 kg (127 lb 10.3 oz)    Physical Exam:  General appearance: alert, cooperative and no distress Head: Normocephalic, without obvious abnormality, atraumatic Eyes: conjunctivae/corneas clear. PERRL, EOM's intact. Fundi benign. Ears: normal TM's and external ear canals both ears Nose:  Nares normal. Septum midline. Mucosa normal. No drainage or sinus tenderness. Throat: lips, mucosa, and tongue normal; teeth and gums normal Neck: no adenopathy, no carotid bruit, no JVD, supple, symmetrical, trachea midline and thyroid not enlarged, symmetric, no tenderness/mass/nodules Back: symmetric, no curvature. ROM normal. No CVA tenderness. Resp: clear to auscultation bilaterally Cardio: regular rate and rhythm, S1, S2 normal, no murmur, click, rub or gallop GI: soft, non-tender; bowel sounds normal; no masses,  no organomegaly Extremities: extremities normal, atraumatic, no cyanosis or edema Pulses: 2+ and symmetric Skin: Skin color, texture, turgor normal. No rashes or lesions Neurologic: Grossly normal-generalized trunk and proximal ext weakness. Cognitively intact. CN norml Incision/Wound: LUE incisions clean. Chest sites clean.   Assessment/Plan: 1. Functional deficits secondary to deconditioning after pneumonia/respiratory failure/ acute on chronic renal failure which require 3+ hours per day of interdisciplinary therapy in a comprehensive inpatient rehab setting. Physiatrist is providing close team supervision and 24 hour management of active medical problems listed below. Physiatrist and rehab team continue to assess barriers to discharge/monitor patient progress toward functional and medical goals. FIM: FIM - Bathing Bathing Steps Patient Completed: Chest;Right Arm;Left Arm;Abdomen;Front perineal area;Buttocks;Right upper leg;Left upper leg (w/c at sink; skiped legs andfeet today) Bathing: 5: Supervision: Safety issues/verbal cues  FIM - Upper Body Dressing/Undressing Upper body dressing/undressing steps patient completed: Thread/unthread left sleeve of pullover shirt/dress;Put head through opening of pull over shirt/dress;Pull shirt over trunk;Thread/unthread right sleeve of pullover shirt/dresss Upper body dressing/undressing: 5: Set-up assist to: Obtain clothing/put  away  FIM - Lower Body Dressing/Undressing Lower body dressing/undressing steps patient completed: Thread/unthread right underwear leg;Thread/unthread left underwear leg;Pull underwear up/down;Thread/unthread right pants leg;Thread/unthread left pants leg;Pull pants up/down;Don/Doff right sock;Don/Doff left sock Lower body dressing/undressing: 5: Supervision: Safety issues/verbal cues  FIM - Toileting Toileting steps completed by patient: Adjust clothing prior to toileting;Performs perineal hygiene;Adjust clothing after toileting Toileting Assistive Devices: Grab bar or rail for support Toileting: 4: Steadying assist  FIM - Radio producer Devices: Grab bars;Bedside commode Toilet Transfers: 5-To toilet/BSC: Supervision (verbal cues/safety issues);5-From toilet/BSC: Supervision (verbal cues/safety issues)  FIM - Engineer, site Assistive Devices: Arm rests Bed/Chair Transfer: 5: Bed > Chair or W/C: Supervision (verbal cues/safety issues);5: Chair or W/C > Bed: Supervision (verbal cues/safety issues)  FIM - Locomotion: Wheelchair Locomotion: Wheelchair: 1: Total Assistance/staff pushes wheelchair (Pt<25%) FIM - Locomotion: Ambulation Locomotion: Ambulation Assistive Devices: Other (comment) (HHA) Ambulation/Gait Assistance: 4: Min guard Locomotion: Ambulation: 1: Travels less than 50 ft with minimal assistance (Pt.>75%)  Comprehension Comprehension Mode: Auditory Comprehension: 6-Follows complex conversation/direction: With extra time/assistive device  Expression Expression Mode: Verbal Expression: 7-Expresses complex ideas: With no assist  Social Interaction Social Interaction: 6-Interacts appropriately with others with medication or extra time (anti-anxiety, antidepressant).  Problem Solving Problem Solving: 7-Solves complex problems: Recognizes & self-corrects  Memory Memory: 7-Complete Independence: No helper  1.  Deconditioning after pneumonia with severe chronic kidney disease  2. DVT Prophylaxis/Anticoagulation: SCDs. Monitor for any signs of deep vein thrombosis  3. ID/pneumonia. Completes Levaquin 10/15/11. Followup chest x-ray today given leukocytosis. Check oxygen saturation every shift.  4. End-stage renal disease. Patient with dialysis initiated during this hospital stay. Weigh patient daily. Vascular surgery to followup for permanent access. Following volumes also.   -removed staples 5. Hypertension. Change catapres to 0.2mg  and continue hydralazine 50 mg 4 times a day, bystolic 20 mg daily.    -dizzy when volume drops (ie with diarrhea) or with bp spikes  -encouraged adequate dietary/fluid intake  -TTH and will add binder  -consider midodrine trial if orthostasis persistent  6. Diabetes mellitus. Hemoglobin A1c 6.1. Dropped hs lantus  Check blood sugars a.c. and at bedtime- fair control at present. Follow for further trend. 7. Chronic anemia hemoglobin 7.5-9.3. Followup labs with hemodialysis/transfused 2 units 10/08/2011- epo/iron per cka 8. Diarrhea- improved  -adequate fluids  -backed off laxatives a bit.   LOS (Days) 5 A FACE TO FACE EVALUATION WAS PERFORMED  Shya Kovatch T 10/13/2011, 8:57 AM

## 2011-10-14 LAB — GLUCOSE, CAPILLARY
Glucose-Capillary: 96 mg/dL (ref 70–99)
Glucose-Capillary: 74 mg/dL (ref 70–99)
Glucose-Capillary: 84 mg/dL (ref 70–99)
Glucose-Capillary: 84 mg/dL (ref 70–99)
Glucose-Capillary: 85 mg/dL (ref 70–99)

## 2011-10-14 NOTE — Progress Notes (Signed)
Physical Therapy Session Note  Patient Details  Name: [name] MRN: [id number] Date of Birth: 1970-09-29  Today's Date: 10/14/2011 Time: [id number] Time Calculation (min): 40 min  Short Term Goals: Week 1:  PT Short Term Goal 1 (Week 1): same as LTGs  Skilled Therapeutic Interventions/Progress Updates:    Worked to pt tolerance per MD recommendations, denied nausea or dizziness till throwing ball up and catching it herself- at which times she states she was "see spots" and gait back to chair noted to be more unsteady; all symptoms resolved with sitting rest break. One more complaint of dizziness while bouncing and catching ball while walking with increased unsteadiness and LOB to R with this, requiring physical A to prevent fall.  Gait training with challenges, unable to significantly increase speed and more difficulty going backwards, A at times to prevent fall with sudden stops or change in direction, tolerates <100 ft at a time before needing to sit.  Therapeutic activity- worked on balance strategies and repetitive sit to stands without UE's  Therapy Documentation Precautions:  Precautions Precautions: Fall Precaution Comments: binder and TEDS for orthostatis Restrictions Weight Bearing Restrictions: No General: Amount of Missed PT Time (min): 37 Minutes Missed Time Reason: Other (comment) (nauseau, nurse request pt back to bed) Vital Signs: Therapy Vitals Temp: 97.7 F (36.5 C) Temp src: Oral Pulse Rate: 89  (with activity) BP: 124/74 mmHg (right upon sitting) Patient Position, if appropriate: Sitting Oxygen Therapy SpO2: 95 % O2 Device: None (Room air) Pain: Pain Assessment Pain Assessment: No/denies pain Mobility: S without UE's or with Locomotion : Balance: throwing ball, catching,  looking up  trying to facilitate posterior LOB for her to correct Exercises: General Exercises - Lower Extremity Hip ABduction/ADduction: AROM;10 reps;Standing Toe Raises:  AROM;Standing;15 reps Heel Raises: AROM;15 reps;Standing Repetitive Sit to Stands: 10 reps;No upper extremities;Other (comment) (needed to rock a bit with fatigue from 18 inch height) Other Treatments: Treatments Therapeutic Activity: worked on hip and ankle strategy to correct posterior LOB, early step strategy with forward LOB and able to withstand mod perturbations all directions but back MAX A to stand statically on foam wedge  See FIM for current functional status  Therapy/Group: Individual Therapy  [name] 10/14/2011, 2:12 PM

## 2011-10-14 NOTE — Progress Notes (Signed)
Occupational Therapy Session Note  Patient Details  Name: [name] MRN: [id number] Date of Birth: 02/15/1971  Today's Date: 10/14/2011 Time: 1000-1045 Time Calculation (min): 45 min  Short Term Goals: Week 1:     Skilled Therapeutic Interventions/Progress Updates:    Pt seen for bathing and dressing session.  Pt initially with BP 147/79 supine in bed.  After getting OOB and walking to the bathroom BP decreased to 90/61.  Pt very dizzy, sat for 3-4 mins and BP came back up to 116/71 with abdominal binder in place.  Performed bathing in sitting at the sink, with standing for peri area.  Pt requiring multiple rest breaks during session secondary to nausea and dizziness.  BP at conclusion of session back up to 141/80 with binder and TEDs.  Pt left sitting in wheelchair at conclusion of session.  During session min guard for balance initially but as BP dropped needed constant min assist to ambulate from the bathroom to the wheelchair.  Therapy Documentation Precautions:  Precautions Precautions: Fall Precaution Comments: binder abd TEDS for orthostasis Restrictions Weight Bearing Restrictions: No  Vital Signs: Therapy Vitals Temp: 97.7 F (36.5 C) Temp src: Oral BP: 192/82 mmHg Patient Position, if appropriate: Sitting Pain: Pain Assessment Pain Assessment: No/denies pain ADL: See FIM for current functional status  Therapy/Group: Individual Therapy  [name] 10/14/2011, 1:22 PM

## 2011-10-14 NOTE — Progress Notes (Signed)
Physical Therapy Session Note  Patient Details  Name: [name] MRN: [id number] Date of Birth: [birth year]  Today's Date: 10/14/2011 Time: 1107-1130 Time Calculation (min): 23 min  Missed 37 minutes d/t nausea  Short Term Goals: Week 1:  PT Short Term Goal 1 (Week 1): same as LTGs  Skilled Therapeutic Interventions/Progress Updates:    Pt still having general weakness and fluctuating BP's. She also reports episode of chest pain in dialysis over weekend. Pt in agreement to add w/c goals as there may be times ambulation would be unsafe if these medical issues continue and that she is overall new to dialysis.  Standing there-ex for balance retraining with BUE support, unable to get BP standing but pt denied any dizziness and upon sitting BP 194/89 (before starting seated BP 192/82).  Pt c/o general weakness, changed treatment to seated household w/c propulsion but pt then became nauseated.  Nurse informed and request pt lie down.  Therapy Documentation Precautions:  Precautions Precautions: Fall Precaution Comments: binder abd TEDS for orthostasis Restrictions Weight Bearing Restrictions: No General: Amount of Missed PT Time (min): 37 Minutes Missed Time Reason: Other (comment) (nauseau, nurse request pt back to bed) Vital Signs: Therapy Vitals BP: 192/82 mmHg Patient Position, if appropriate: Sitting Pain: Pain Assessment Pain Assessment: No/denies pain Mobility: Transfers Sit to Stand: 5: Supervision;With upper extremity assist Sit to Stand Details (indicate cue type and reason): S d/t BP issues and pt c/o feeling weak but no instruction required Stand to Sit: 5: Supervision;With upper extremity assist Locomotion : Architect: Yes Wheelchair Assistance: 5: Investment banker, operational Details: Verbal cues for Marketing executive: Both upper extremities Wheelchair Parts Management: Needs assistance Distance: 40 ft thru  tight obstacles with vc's for technique, increased time and effort      Balance:S static standing without UE support   Exercises: General Exercises - Lower Extremity Hip ABduction/ADduction: AROM;10 reps;Standing Toe Raises: AROM;Standing;15 reps Heel Raises: AROM;15 reps;Standing Other Treatments:   Car transfer S stand pivot without device  See FIM for current functional status  Therapy/Group: Individual Therapy  [name] 10/14/2011, 11:38 AM

## 2011-10-14 NOTE — Progress Notes (Signed)
LBM 10/10/11.  Refusing laxatives at this time.

## 2011-10-14 NOTE — Progress Notes (Signed)
Patient ID: [name], female   DOB: 04-Feb-1971, 41 y.o.   MRN: [id number] Subjective/Complaints: Review of Systems : Some nausea last night. Didn't eat real well yesterday Respiratory: Positive for cough.   Neurological: Positive for dizziness and weakness.  All other systems reviewed and are negative.   Objective: Vital Signs: Blood pressure 195/75, pulse 85, temperature 98.3 F (36.8 C), temperature source Oral, resp. rate 20, height 5\' 2"  (1.575 m), weight 59.5 kg (131 lb 2.8 oz), last menstrual period 09/16/2011, SpO2 93.00%. No results found.  Basename 10/12/11 1532  WBC 15.0*  HGB 11.4*  HCT 33.5*  PLT 170    Basename 10/12/11 1530  NA 134*  K 3.9  CL 96  CO2 28  GLUCOSE 118*  BUN 19  CREATININE 4.72*  CALCIUM 9.2   CBG (last 3)   Basename 10/14/11 0704 10/13/11 2031 10/13/11 1636  GLUCAP 74 94 84    Wt Readings from Last 3 Encounters:  10/12/11 59.5 kg (131 lb 2.8 oz)  10/08/11 57.9 kg (127 lb 10.3 oz)  10/08/11 57.9 kg (127 lb 10.3 oz)    Physical Exam:  General appearance: alert, cooperative and no distress. A little quiet today Head: Normocephalic, without obvious abnormality, atraumatic Eyes: conjunctivae/corneas clear. PERRL, EOM's intact. Fundi benign. Ears: normal TM's and external ear canals both ears Nose: Nares normal. Septum midline. Mucosa normal. No drainage or sinus tenderness. Throat: lips, mucosa, and tongue normal; teeth and gums normal Neck: no adenopathy, no carotid bruit, no JVD, supple, symmetrical, trachea midline and thyroid not enlarged, symmetric, no tenderness/mass/nodules Back: symmetric, no curvature. ROM normal. No CVA tenderness. Resp: clear to auscultation bilaterally Cardio: regular rate and rhythm, S1, S2 normal, no murmur, click, rub or gallop GI: soft, non-tender; bowel sounds normal; no masses,  no organomegaly Extremities: extremities normal, atraumatic, no cyanosis or edema Pulses: 2+ and symmetric Skin: Skin  color, texture, turgor normal. No rashes or lesions Neurologic: Grossly normal-generalized trunk and proximal ext weakness. Cognitively intact. CN norml.  Incision/Wound: LUE incisions clean. Chest sites clean.   Assessment/Plan: 1. Functional deficits secondary to deconditioning after pneumonia/respiratory failure/ acute on chronic renal failure which require 3+ hours per day of interdisciplinary therapy in a comprehensive inpatient rehab setting. Physiatrist is providing close team supervision and 24 hour management of active medical problems listed below. Physiatrist and rehab team continue to assess barriers to discharge/monitor patient progress toward functional and medical goals. FIM: FIM - Bathing Bathing Steps Patient Completed: Chest;Right Arm;Left Arm;Abdomen;Left lower leg (including foot);Buttocks;Right upper leg;Left upper leg;Right lower leg (including foot);Front perineal area Bathing: 5: Supervision: Safety issues/verbal cues  FIM - Upper Body Dressing/Undressing Upper body dressing/undressing steps patient completed: Thread/unthread right sleeve of pullover shirt/dresss;Thread/unthread left sleeve of pullover shirt/dress;Put head through opening of pull over shirt/dress;Pull shirt over trunk Upper body dressing/undressing: 5: Set-up assist to: Obtain clothing/put away FIM - Lower Body Dressing/Undressing Lower body dressing/undressing steps patient completed: Thread/unthread right underwear leg;Thread/unthread left underwear leg;Pull underwear up/down Lower body dressing/undressing: 3: Mod-Patient completed 50-74% of tasks  FIM - Toileting Toileting steps completed by patient: Adjust clothing prior to toileting;Performs perineal hygiene;Adjust clothing after toileting Toileting Assistive Devices: Grab bar or rail for support Toileting: 4: Steadying assist  FIM - Radio producer Devices: Grab bars Toilet Transfers: 5-To toilet/BSC: Supervision  (verbal cues/safety issues);5-From toilet/BSC: Supervision (verbal cues/safety issues)  FIM - Engineer, site Assistive Devices: Arm rests Bed/Chair Transfer: 5: Bed > Chair or W/C: Supervision (verbal cues/safety issues);5:  Chair or W/C > Bed: Supervision (verbal cues/safety issues)  FIM - Locomotion: Wheelchair Locomotion: Wheelchair: 1: Total Assistance/staff pushes wheelchair (Pt<25%) FIM - Locomotion: Ambulation Locomotion: Ambulation Assistive Devices: Other (comment) (HHA) Ambulation/Gait Assistance: 4: Min guard Locomotion: Ambulation: 1: Travels less than 50 ft with minimal assistance (Pt.>75%)  Comprehension Comprehension Mode: Auditory Comprehension: 6-Follows complex conversation/direction: With extra time/assistive device  Expression Expression Mode: Verbal Expression: 7-Expresses complex ideas: With no assist  Social Interaction Social Interaction: 6-Interacts appropriately with others with medication or extra time (anti-anxiety, antidepressant).  Problem Solving Problem Solving: 7-Solves complex problems: Recognizes & self-corrects  Memory Memory: 7-Complete Independence: No helper  1. Deconditioning after pneumonia with severe chronic kidney disease  2. DVT Prophylaxis/Anticoagulation: SCDs. Monitor for any signs of deep vein thrombosis  3. ID/pneumonia. Completes Levaquin 10/15/11. F/u cxr stable. Skin clear. Continues with leukocytosis 4. End-stage renal disease. Patient with dialysis initiated during this hospital stay. Weigh patient daily. Vascular surgery to followup for permanent access. Following volumes also.   -removed staples 5. Hypertension. Change catapres to 0.2mg  and continue hydralazine 50 mg 4 times a day, bystolic 20 mg daily.  -bp's looked more consistent yesterday  -dizzy when volume drops (ie with diarrhea) or with bp spikes  -encouraged adequate dietary/fluid intake (didn't eat well yesterday)  -TTH and will add  binder  -consider midodrine trial if orthostasis persistent  6. Diabetes mellitus. Hemoglobin A1c 6.1. Dropped hs lantus  Check blood sugars a.c. and at bedtime- fair control at present. Follow for further trend. Pt needs to eat more consistently 7. Chronic anemia hemoglobin 7.5-9.3. Followup labs with hemodialysis/transfused 2 units 10/08/2011- epo/iron per cka 8. Diarrhea- improved    LOS (Days) 6 A FACE TO FACE EVALUATION WAS PERFORMED  Zenda Herskowitz T 10/14/2011, 8:34 AM

## 2011-10-14 NOTE — Progress Notes (Signed)
S:says she is up walking on her own.  Appetite still marginal O:BP 150/78  Pulse 85  Temp(Src) 98.3 F (36.8 C) (Oral)  Resp 20  Ht 5\' 2"  (1.575 m)  Wt 59.5 kg (131 lb 2.8 oz)  BMI 23.99 kg/m2  SpO2 93%  LMP 09/16/2011  Intake/Output Summary (Last 24 hours) at 10/14/11 0946 Last data filed at 10/14/11 0400  Gross per 24 hour  Intake    720 ml  Output     10 ml  Net    710 ml   Weight change:  [id number] and alert CVS:RRR Resp:clear Abd:+BS NTND Ext:no edema NEURO:Ox3  No asterixis      . cloNIDine  0.2 mg Transdermal Weekly  . cloNIDine  0.1 mg Oral Once  . hydrALAZINE  50 mg Oral Q6H  . insulin aspart  0-5 Units Subcutaneous QHS  . insulin glargine  5 Units Subcutaneous QHS  . levofloxacin  500 mg Oral Q48H  . multivitamin  1 tablet Oral Daily  . nebivolol  20 mg Oral Daily   No results found. BMET    Component Value Date/Time   NA 134* 10/12/2011 1530   K 3.9 10/12/2011 1530   CL 96 10/12/2011 1530   CO2 28 10/12/2011 1530   GLUCOSE 118* 10/12/2011 1530   GLUCOSE 318 03/31/2009 0815   BUN 19 10/12/2011 1530   CREATININE 4.72* 10/12/2011 1530   CALCIUM 9.2 10/12/2011 1530   CALCIUM 8.5 04/02/2011 0514   GFRNONAA 10* 10/12/2011 1530   GFRAA 12* 10/12/2011 1530   CBC    Component Value Date/Time   WBC 15.0* 10/12/2011 1532   RBC 3.93 10/12/2011 1532   HGB 11.4* 10/12/2011 1532   HCT 33.5* 10/12/2011 1532   PLT 170 10/12/2011 1532   MCV 85.2 10/12/2011 1532   MCH 29.0 10/12/2011 1532   MCHC 34.0 10/12/2011 1532   RDW 15.6* 10/12/2011 1532   LYMPHSABS 1.1 09/27/2011 1505   MONOABS 0.5 09/27/2011 1505   EOSABS 0.0 09/27/2011 1505   BASOSABS 0.0 09/27/2011 1505     Assessment:  1. ESRD sec HTN 2. DM 3. HTN 4. Anemia 5. Sec HPTH not requiring Vit D   Plan: 1. HD in AM and check renal profile 2.  Has been set up to go to [facility] but plans to live with her parents at [place] which is closer to [facility].  Will work on outpt spot there   [name] T

## 2011-10-14 NOTE — Progress Notes (Signed)
Occupational Therapy Session Note  Patient Details  Name: [name] MRN: [id number] Date of Birth: Oct 26, 1970  Today's Date: 10/14/2011 Time: [id number] Time Calculation (min): 29 min  Skilled Therapeutic Interventions/Progress Updates:  Upon entering room patient found supine in bed. Engaged in bed mobility and edge of bed -> w/c transfer for patient to propel self -> ADL apartment. Engaged in [place] transfer onto shower seat with min assist in/out and BSC transfer with min assist on/off. Patient then ambulated to therapy gym with min/steady assist from therapist for UB/LB exercises on NUSTEP for ~5 minutes.   Precautions:  Precautions Precautions: Fall Precaution Comments: binder abd TEDS for orthostasis Restrictions Weight Bearing Restrictions: No  See FIM for current functional status  Therapy/Group: Individual Therapy  Dody Smartt 10/14/2011, 1:39 PM

## 2011-10-15 DIAGNOSIS — N186 End stage renal disease: Secondary | ICD-10-CM

## 2011-10-15 DIAGNOSIS — J159 Unspecified bacterial pneumonia: Secondary | ICD-10-CM

## 2011-10-15 DIAGNOSIS — Z5189 Encounter for other specified aftercare: Secondary | ICD-10-CM

## 2011-10-15 DIAGNOSIS — R5381 Other malaise: Secondary | ICD-10-CM

## 2011-10-15 DIAGNOSIS — I10 Essential (primary) hypertension: Secondary | ICD-10-CM

## 2011-10-15 LAB — GLUCOSE, CAPILLARY
Glucose-Capillary: 65 mg/dL — ABNORMAL LOW (ref 70–99)
Glucose-Capillary: 67 mg/dL — ABNORMAL LOW (ref 70–99)
Glucose-Capillary: 81 mg/dL (ref 70–99)
Glucose-Capillary: 81 mg/dL (ref 70–99)
Glucose-Capillary: 80 mg/dL (ref 70–99)
Glucose-Capillary: 111 mg/dL — ABNORMAL HIGH (ref 70–99)

## 2011-10-15 MED ORDER — BOOST / RESOURCE BREEZE PO LIQD: 1.0000 | ORAL | Status: DC

## 2011-10-15 NOTE — Progress Notes (Signed)
Occupational Therapy Session Note and Weekly Progress Note  Patient Details  Name: [name] MRN: [id number] Date of Birth: [birth year]  Today's Date: 10/15/2011 Time: 0900-0955 Time Calculation (min): 55 min  Skilled Therapeutic Interventions/Progress Updates:  Pt engaged in ADL retraining including bathing and dressing w/c level at sink.  Pt amb with no AD to sink to sit in w/c.  Pt completed UP bathing and dressing and c/o feeling light headed.  Therapist assisted pt in donning abdominal binder (BP 110/68 and dropped to 90/59 after standing). Therapist assisted pt with donning TED hose and threading pants secondary to pt not feeling well after resting approx 10 mins (BP ranged between 112/72 and 114/74 during over 10 mins).  Pt amb to recliner and call bell placed within reach.  BP at end of session at 153/84. RN aware of fluctuating BP during session.  Discussed with pt the importance of self monitoring symptoms.  Weekly Summary Patient has progressed well with BADLs since admission but continues to exhibit fluctuating BP impacting on ability to complete tasks safely.  Pt is supervision for bathing and dressing tasks with occasional steady A when standing secondary to low BP.  Pt performs toilet transfers and tub transfers with supervision and occasional steady A secondary to low BP.  LTGs downgraded to supervision secondary to fluctuating BP.  Discussed with patient the importance of self monitoring her symptoms when BP drops and asking for assistance when necessary.  Patient continues to demonstrate the following deficits: decreased standing balance/tolerance/endurance, decreased independence with transfers & functional mobility, decreased independence with ADLs/IADLs. Therefore, patient will continue to benefit from skilled OT intervention to enhance overall performance with BADL, iADL and Reduce care partner burden.  Patient progressing toward long term goals..  Continue plan of care -  downgraded supervision goals.  Precautions:  Precautions Precautions: Fall Precaution Comments: binder and TEDS, orthostatic- go by pt tolerance Restrictions Weight Bearing Restrictions: No  Pain: Pain Assessment Pain Assessment: No/denies pain  ADL: ADL Eating: Independent Where Assessed-Eating: Chair Grooming: Independent Where Assessed-Grooming: Sitting at sink Upper Body Bathing: Supervision/safety Where Assessed-Upper Body Bathing: Sitting at sink Lower Body Bathing: Contact guard Where Assessed-Lower Body Bathing: Standing at sink Upper Body Dressing: Setup Where Assessed-Upper Body Dressing: Sitting at sink Lower Body Dressing: Contact guard Where Assessed-Lower Body Dressing: Standing at sink Toileting: Supervision/safety Where Assessed-Toileting: Glass blower/designer: Therapist, music Method: Ambulating Tub/Shower Transfer: Metallurgist Method: Optometrist: Shower seat with back  See FIM for current functional status  Therapy/Group: Individual Therapy  Leroy Libman 10/15/2011, 12:00 PM   Chrys Racer, M.S., OTR/L

## 2011-10-15 NOTE — Progress Notes (Signed)
S:says she is up walking on her own.  Appetite still marginal O:BP 120/69  Pulse 80  Temp(Src) 98.1 F (36.7 C) (Oral)  Resp 18  Ht 5\' 2"  (1.575 m)  Wt 64.6 kg (142 lb 6.7 oz)  BMI 26.05 kg/m2  SpO2 96%  LMP 09/16/2011  Intake/Output Summary (Last 24 hours) at 10/15/11 1002 Last data filed at 10/15/11 0900  Gross per 24 hour  Intake    720 ml  Output      0 ml  Net    720 ml   Weight change:  [id number] and alert CVS:RRR Resp:clear Abd:+BS NTND Ext:no edema NEURO:Ox3  No asterixis      . cloNIDine  0.2 mg Transdermal Weekly  . cloNIDine  0.1 mg Oral Once  . hydrALAZINE  50 mg Oral Q6H  . insulin aspart  0-5 Units Subcutaneous QHS  . insulin glargine  5 Units Subcutaneous QHS  . levofloxacin  500 mg Oral Q48H  . multivitamin  1 tablet Oral Daily  . nebivolol  20 mg Oral Daily   No results found. BMET    Component Value Date/Time   NA 134* 10/12/2011 1530   K 3.9 10/12/2011 1530   CL 96 10/12/2011 1530   CO2 28 10/12/2011 1530   GLUCOSE 118* 10/12/2011 1530   GLUCOSE 318 03/31/2009 0815   BUN 19 10/12/2011 1530   CREATININE 4.72* 10/12/2011 1530   CALCIUM 9.2 10/12/2011 1530   CALCIUM 8.5 04/02/2011 0514   GFRNONAA 10* 10/12/2011 1530   GFRAA 12* 10/12/2011 1530   CBC    Component Value Date/Time   WBC 15.0* 10/12/2011 1532   RBC 3.93 10/12/2011 1532   HGB 11.4* 10/12/2011 1532   HCT 33.5* 10/12/2011 1532   PLT 170 10/12/2011 1532   MCV 85.2 10/12/2011 1532   MCH 29.0 10/12/2011 1532   MCHC 34.0 10/12/2011 1532   RDW 15.6* 10/12/2011 1532   LYMPHSABS 1.1 09/27/2011 1505   MONOABS 0.5 09/27/2011 1505   EOSABS 0.0 09/27/2011 1505   BASOSABS 0.0 09/27/2011 1505     Assessment:  1. ESRD sec HTN 2. DM 3. HTN 4. Anemia not on EPO 5. Sec HPTH not requiring Vit D   Plan: 1. HD  Today 2.  Working on outpt spot at [place]

## 2011-10-15 NOTE — Progress Notes (Signed)
Recreational Therapy Session Note  Patient Details  Name: Terrea Krome MRN: [id number] Date of Birth: 12/24/1970 Today's Date: 10/15/2011 Time:  1435-1455 Pain: no c/o pain Skilled Therapeutic Interventions/Progress Updates: Discharge planning seated in regards to community pursuits, energy conservation and identification/negotiation of obstacles.  Pt verbalized understanding.  Therapy/Group: Individual Therapy  Activity Level: Simple:  Level of assist: Supervision  Tashunda Vandezande 10/15/2011, 4:22 PM

## 2011-10-15 NOTE — Progress Notes (Signed)
Physical Therapy Session Note  Patient Details  Name: [name] MRN: [id number] Date of Birth: [birth year]  Today's Date: 10/15/2011 Time: 1105-1204 Time Calculation (min): 59 min  Short Term Goals: Week 1:  PT Short Term Goal 1 (Week 1): same as LTGs  Skilled Therapeutic Interventions/Progress Updates:    Education re: fall risk and safety- pt to begin self monitoring when for safety based on symptoms of dizziness she will ambulate vs use w/c in therapies, OT also in agreement.  Pt will require an ultra light weight w/c based on generalized weakness and CHF along with fluctuating activity tolerance from dialysis and BP.  Patient demonstrates increased fall risk as noted by score of   /56 on Berg Balance Scale.  (<36= high risk for falls, close to 100%; 37-45 significant >80%; 46-51 moderate >50%; 52-55 lower >25%) but improved from 21 to 44/56.  Pt had to take 1 seated rest break d/t dizziness after completing 13 of 15 items on berg.  Pt able to self correct LOB today during curb and gait training but still present with unsteadiness. Also improved w/c mobility with obstacles.  Therapy Documentation Precautions:  Precautions Precautions: Fall Precaution Comments: binder and TEDS, orthostatic- go by pt tolerance Restrictions Weight Bearing Restrictions: No     Pain:none  Mobility: mod I to S transfers based on symptoms but always increased time   Locomotion :close S Gait Gait Pattern: Step-through pattern High Level Ambulation High Level Ambulation: Backwards walking;Direction changes;Sudden stops;Head turns;Other high level ambulation Stairs / Additional Locomotion Curb: 5: Supervision (close S x 4, mild LOB stepping up twice but self corrected) Product manager Mobility: Yes Wheelchair Assistance: 6: Modified independent (Device/Increase time) Environmental health practitioner: Both upper extremities Wheelchair Parts Management: Supervision/cueing Distance: household 60  ft, hospital environement 150 x 2   Balance: Balance Balance Assessed: Yes Standardized Balance Assessment Standardized Balance Assessment: Furniture conservator/restorer Berg Balance Test Sit to Stand: Able to stand without using hands and stabilize independently Standing Unsupported: Able to stand safely 2 minutes Sitting with Back Unsupported but Feet Supported on Floor or Stool: Able to sit safely and securely 2 minutes Stand to Sit: Sits safely with minimal use of hands Transfers: Able to transfer safely, minor use of hands Standing Unsupported with Eyes Closed: Able to stand 10 seconds safely Standing Ubsupported with Feet Together: Able to place feet together independently and stand 1 minute safely From Standing, Reach Forward with Outstretched Arm: Can reach confidently >25 cm (10") From Standing Position, Pick up Object from Floor: Able to pick up shoe, needs supervision From Standing Position, Turn to Look Behind Over each Shoulder: Looks behind from both sides and weight shifts well Turn 360 Degrees: Able to turn 360 degrees safely but slowly Standing Unsupported, Alternately Place Feet on Step/Stool: Able to complete >2 steps/needs minimal assist Standing Unsupported, One Foot in Front: Able to take small step independently and hold 30 seconds Standing on One Leg: Unable to try or needs assist to prevent fall Total Score: 44  Static Sitting Balance Static Sitting - Level of Assistance: 7: Independent Dynamic Standing Balance Dynamic Standing - Level of Assistance: 7: Independent   :    See FIM for current functional status  Therapy/Group: Individual Therapy  [name] 10/15/2011, 12:15 PM

## 2011-10-15 NOTE — Progress Notes (Signed)
Patient ID: [name], female   DOB: [birth year], 41 y.o.   MRN: [id number] Patient ID: [name], female   DOB: 1971/06/10, 41 y.o.   MRN: [id number] Subjective/Complaints: Review of Systems : A little constipated but otherwise did fairly well yesterfday Respiratory: Positive for cough.   Neurological: Positive for dizziness and weakness.     Objective: Vital Signs: Blood pressure 120/69, pulse 80, temperature 98.1 F (36.7 C), temperature source Oral, resp. rate 18, height 5\' 2"  (1.575 m), weight 64.6 kg (142 lb 6.7 oz), last menstrual period 09/16/2011, SpO2 96.00%. No results found.  Basename 10/12/11 1532  WBC 15.0*  HGB 11.4*  HCT 33.5*  PLT 170    Basename 10/12/11 1530  NA 134*  K 3.9  CL 96  CO2 28  GLUCOSE 118*  BUN 19  CREATININE 4.72*  CALCIUM 9.2   CBG (last 3)   Basename 10/15/11 0753 10/15/11 0725 10/15/11 0707  GLUCAP 81 65* 67*    Wt Readings from Last 3 Encounters:  10/15/11 64.6 kg (142 lb 6.7 oz)  10/08/11 57.9 kg (127 lb 10.3 oz)  10/08/11 57.9 kg (127 lb 10.3 oz)    Physical Exam:  General appearance: alert, cooperative and no distress. Head: Normocephalic, without obvious abnormality, atraumatic Eyes: conjunctivae/corneas clear. PERRL, EOM's intact. Fundi benign. Ears: normal TM's and external ear canals both ears Nose: Nares normal. Septum midline. Mucosa normal. No drainage or sinus tenderness. Throat: lips, mucosa, and tongue normal; teeth and gums normal Neck: no adenopathy, no carotid bruit, no JVD, supple, symmetrical, trachea midline and thyroid not enlarged, symmetric, no tenderness/mass/nodules Back: symmetric, no curvature. ROM normal. No CVA tenderness. Resp: clear to auscultation bilaterally Cardio: regular rate and rhythm, S1, S2 normal, no murmur, click, rub or gallop GI: soft, non-tender; bowel sounds normal; no masses,  no organomegaly Extremities: extremities normal, atraumatic, no cyanosis or edema Pulses: 2+ and  symmetric Skin: Skin color, texture, turgor normal. No rashes or lesions Neurologic: Grossly normal-generalized trunk and proximal ext weakness. Cognitively intact. CN norml.  Incision/Wound: LUE incisions clean, dry , and intact. Chest sites clean.    Assessment/Plan: 1. Functional deficits secondary to deconditioning after pneumonia/respiratory failure/ acute on chronic renal failure which require 3+ hours per day of interdisciplinary therapy in a comprehensive inpatient rehab setting. Physiatrist is providing close team supervision and 24 hour management of active medical problems listed below. Physiatrist and rehab team continue to assess barriers to discharge/monitor patient progress toward functional and medical goals. FIM: FIM - Bathing Bathing Steps Patient Completed: Chest;Right Arm;Left Arm;Abdomen;Front perineal area;Buttocks;Right upper leg;Left upper leg Bathing: 4: Min-Patient completes 8-9 71f 10 parts or 75+ percent  FIM - Upper Body Dressing/Undressing Upper body dressing/undressing steps patient completed: Thread/unthread right sleeve of pullover shirt/dresss;Thread/unthread left sleeve of pullover shirt/dress;Put head through opening of pull over shirt/dress;Pull shirt over trunk Upper body dressing/undressing: 5: Set-up assist to: Obtain clothing/put away FIM - Lower Body Dressing/Undressing Lower body dressing/undressing steps patient completed: Thread/unthread right underwear leg;Thread/unthread left underwear leg;Pull underwear up/down;Thread/unthread right pants leg;Thread/unthread left pants leg;Pull pants up/down Lower body dressing/undressing: 4: Min-Patient completed 75 plus % of tasks  FIM - Toileting Toileting steps completed by patient: Adjust clothing prior to toileting;Performs perineal hygiene;Adjust clothing after toileting Toileting Assistive Devices: Grab bar or rail for support Toileting: 4: Steadying assist  FIM - Engineer, structural Devices: Bedside commode Toilet Transfers: 4-To toilet/BSC: Min A (steadying Pt. > 75%);4-From toilet/BSC: Min A (steadying Pt. > 75%)  FIM -  Bed/Chair Financial planner Devices: Arm rests Bed/Chair Transfer: 7: Independent: No helper;4: Bed > Chair or W/C: Min A (steadying Pt. > 75%);4: Chair or W/C > Bed: Min A (steadying Pt. > 75%) (Pt with dizziness and LOB posteriorly.)  FIM - Locomotion: Wheelchair Distance: 40 ft thru tight obstacles with vc's for technique, increased time and effort Locomotion: Wheelchair: 1: Travels less than 50 ft with supervision, cueing or coaxing FIM - Locomotion: Ambulation Locomotion: Ambulation Assistive Devices: Other (comment) (HHA) Ambulation/Gait Assistance: 4: Min assist Locomotion: Ambulation: 2: Travels 32 - 149 ft with minimal assistance (Pt.>75%)  Comprehension Comprehension Mode: Auditory Comprehension: 6-Follows complex conversation/direction: With extra time/assistive device  Expression Expression Mode: Verbal Expression: 7-Expresses complex ideas: With no assist  Social Interaction Social Interaction: 6-Interacts appropriately with others with medication or extra time (anti-anxiety, antidepressant).  Problem Solving Problem Solving: 7-Solves complex problems: Recognizes & self-corrects  Memory Memory: 7-Complete Independence: No helper  1. Deconditioning after pneumonia with severe chronic kidney disease  2. DVT Prophylaxis/Anticoagulation: SCDs. Monitor for any signs of deep vein thrombosis  3. ID/pneumonia. Completes Levaquin 10/15/11. F/u cxr stable. Skin clear. Continues with leukocytosis 4. End-stage renal disease. Patient with dialysis initiated during this hospital stay. Weigh patient daily. Vascular surgery to followup for permanent access. Following volumes also.   -removed staples 5. Hypertension. Change catapres to 0.2mg  and continue hydralazine 50 mg 4 times a day, bystolic 20 mg daily.  -bp's  seem to be leveling off  -dizzy when volume drops (ie with diarrhea) or with bp spikes  -encouraged adequate dietary/fluid intake (didn't eat well yesterday)  -TTH and will add binder  -consider midodrine trial if orthostasis becomes a problem again.   6. Diabetes mellitus. Hemoglobin A1c 6.1. Dropped hs lantus  Encourage adequate intake. Adjust regimen as indicated. 7. Chronic anemia hemoglobin 7.5-9.3. Followup labs with hemodialysis/transfused 2 units 10/08/2011- epo/iron per cka 8. Diarrhea- improved- needs something now for constipation.    LOS (Days) 7 A FACE TO FACE EVALUATION WAS PERFORMED  Sherin Murdoch T 10/15/2011, 8:18 AM

## 2011-10-15 NOTE — Progress Notes (Signed)
Nutrition Follow-up  Eating 0 - 50%. Pt with intermittent dizzy and nausea spells. Pt states fluctuating appetite is her baseline and has enough energy to get through therapy with minimal PO intake. Discussed importance of maximizing PO intake at meals.  Diet Order:  Renal 80/90 Supplements: Resource Breeze PO daily PRN  Meds: Scheduled Meds:   . cloNIDine  0.2 mg Transdermal Weekly  . cloNIDine  0.1 mg Oral Once  . hydrALAZINE  50 mg Oral Q6H  . insulin aspart  0-5 Units Subcutaneous QHS  . insulin glargine  5 Units Subcutaneous QHS  . levofloxacin  500 mg Oral Q48H  . multivitamin  1 tablet Oral Daily  . nebivolol  20 mg Oral Daily   Continuous Infusions:  PRN Meds:.acetaminophen, feeding supplement, heparin, ondansetron (ZOFRAN) IV, ondansetron, ondansetron, oxyCODONE, polyethylene glycol, sorbitol  Labs:  CMP     Component Value Date/Time   NA 134* 10/12/2011 1530   K 3.9 10/12/2011 1530   CL 96 10/12/2011 1530   CO2 28 10/12/2011 1530   GLUCOSE 118* 10/12/2011 1530   GLUCOSE 318 03/31/2009 0815   BUN 19 10/12/2011 1530   CREATININE 4.72* 10/12/2011 1530   CALCIUM 9.2 10/12/2011 1530   CALCIUM 8.5 04/02/2011 0514   PROT 6.7 09/27/2011 1926   ALBUMIN 2.7* 10/12/2011 1530   AST 21 09/27/2011 1926   ALT 10 09/27/2011 1926   ALKPHOS 58 09/27/2011 1926   BILITOT 0.2* 09/27/2011 1926   GFRNONAA 10* 10/12/2011 1530   GFRAA 12* 10/12/2011 1530  Phosphorus 4.1 WNL  CBG (last 3)   Basename 10/15/11 0753 10/15/11 0725 10/15/11 0707  GLUCAP 81 65* 67*    Intake/Output Summary (Last 24 hours) at 10/15/11 0908 Last data filed at 10/14/11 1900  Gross per 24 hour  Intake    240 ml  Output      0 ml  Net    240 ml   Weight Status:  64.6 kg s/p HD on 4/30 - stable  Estimated needs:  1830 - 2130 kcal, 73 - 85 grams protein  Nutrition Dx:  Increased nutrient needs r/t HD AEB estimated needs. Ongoing.  Goal:  Pt to consume >/= 75% of meals and supplements.  Intervention:  [employer] scheduled daily. RD to continue to follow.  Monitor:  PO intake, weights, labs, I/O's  Asencion Partridge Pager #:  [phone]

## 2011-10-15 NOTE — Progress Notes (Signed)
Occupational Therapy Note  Patient Details  Name: [name] MRN: [id number] Date of Birth: May 24, 1971 Today's Date: 10/15/2011  Time: 1300-1330 Pt denies pain Individual Therapy  Pt engaged in tub transfers (stepping over edge of tub with use of 1 grab bar), kitchen activities, and home mgmt tasks at amb level with no AD.Marland Kitchen  Pt did not c/o of any dizziness during therapy and stated that she flet fine throughout.  Pt exhibited LOB X 1 but self corrected.  Focus on activity tolerance, safety awareness, and dynamic standing balance.    Leotis Shames [facility] 10/15/2011, 3:46 PM

## 2011-10-15 NOTE — Progress Notes (Signed)
Physical Therapy Weekly Progress Note  Patient Details  Name: [name] MRN: [id number] Date of Birth: Apr 23, 1971  Today's Date: 10/15/2011 Time: 1330-1415 45 min Patient has met 1 of 9 long term goals.  Short term goals not set due to estimated length of stay. Pt functional level has been inconsistent d/t symptoms related to fluctuating BP's, with pt having more unsteadiness when dizzy at times requiring min A.  Patient continues to demonstrate the following deficits: decreased balance and balance strategies in standing, (Berg 44/56) decreased activity tolerance, orthostatic BP in standing and therefore will continue to benefit from skilled PT intervention to enhance overall performance with activity tolerance, balance, postural control and ability to compensate for deficits.  See Patient's Care Plan for progression toward long term goals.  Patient progressing toward long term goals..  Plan of care revisions: added w/c goals for when pt symptomatic for fall prevention and recommend w/c after dailysis so d/c community gait goal.  Skilled Therapeutic Interventions/Progress Updates:    Individual therapy Pain: c/o burning in thighs intermittently with activity- stretched pt in supine (kinetron seated 30 w/cm sq with goal RPE 13 10 min with rests) Balance training: standing on foam balance beam performing resisted UE movement and trunk rotations using resistance  Bands, pulling pt back to encourage correction of posterior LOB; Biodex maze control med (improved from -6 % to +14%) Gait training with challenges to balance Only 1 c/o dizziness during activities during 45 min session  Therapy Documentation Precautions:  Precautions Precautions: Fall Precaution Comments: binder and TEDS, orthostatic- go by pt tolerance Restrictions Weight Bearing Restrictions: No General:   Vital Signs:   Pain: Pain Assessment Pain Assessment: No/denies pain Mobility:   Locomotion  : Ambulation Ambulation/Gait Assistance: 5: Supervision Gait Gait Pattern: Step-through pattern High Level Ambulation High Level Ambulation: Backwards walking;Direction changes;Sudden stops;Head turns;Other high level ambulation Stairs / Additional Locomotion Curb: 5: Supervision (close S x 4, mild LOB stepping up twice but self corrected) Product manager Mobility: Yes Wheelchair Assistance: 6: Modified independent (Device/Increase time) Environmental health practitioner: Both upper extremities Wheelchair Parts Management: Supervision/cueing Distance: household 60 ft, hospital environement 150 x 2  Trunk/Postural Assessment : Cervical Assessment Cervical Assessment: Within Functional Limits Thoracic Assessment Thoracic Assessment: Within Functional Limits Lumbar Assessment Lumbar Assessment: Within Functional Limits Postural Control Postural Control: Within Functional Limits  Balance: Balance Balance Assessed: Yes Standardized Balance Assessment Standardized Balance Assessment: Berg Balance Test Berg Balance Test Sit to Stand: Able to stand without using hands and stabilize independently Standing Unsupported: Able to stand safely 2 minutes Sitting with Back Unsupported but Feet Supported on Floor or Stool: Able to sit safely and securely 2 minutes Stand to Sit: Sits safely with minimal use of hands Transfers: Able to transfer safely, minor use of hands Standing Unsupported with Eyes Closed: Able to stand 10 seconds safely Standing Ubsupported with Feet Together: Able to place feet together independently and stand 1 minute safely From Standing, Reach Forward with Outstretched Arm: Can reach confidently >25 cm (10") From Standing Position, Pick up Object from Floor: Able to pick up shoe, needs supervision From Standing Position, Turn to Look Behind Over each Shoulder: Looks behind from both sides and weight shifts well Turn 360 Degrees: Able to turn 360 degrees safely but  slowly Standing Unsupported, Alternately Place Feet on Step/Stool: Able to complete >2 steps/needs minimal assist Standing Unsupported, One Foot in Front: Able to take small step independently and hold 30 seconds Standing on One Leg: Unable to try or  needs assist to prevent fall Total Score: 44  Static Sitting Balance Static Sitting - Level of Assistance: 7: Independent Dynamic Standing Balance Dynamic Standing - Level of Assistance: 7: Independent        See FIM for current functional status  Therapy/Group: Individual Therapy  [name] 10/15/2011, 2:19 PM

## 2011-10-16 ENCOUNTER — Inpatient Hospital Stay (HOSPITAL_COMMUNITY): Payer: Medicaid Other

## 2011-10-16 DIAGNOSIS — I10 Essential (primary) hypertension: Secondary | ICD-10-CM

## 2011-10-16 DIAGNOSIS — N186 End stage renal disease: Secondary | ICD-10-CM

## 2011-10-16 DIAGNOSIS — R5381 Other malaise: Secondary | ICD-10-CM

## 2011-10-16 DIAGNOSIS — J159 Unspecified bacterial pneumonia: Secondary | ICD-10-CM

## 2011-10-16 DIAGNOSIS — Z5189 Encounter for other specified aftercare: Secondary | ICD-10-CM

## 2011-10-16 LAB — RENAL FUNCTION PANEL
Albumin: 2.8 g/dL — ABNORMAL LOW (ref 3.5–5.2)
BUN: 23 mg/dL (ref 6–23)
CO2: 27 mEq/L (ref 19–32)
Calcium: 8.8 mg/dL (ref 8.4–10.5)
Chloride: 95 mEq/L — ABNORMAL LOW (ref 96–112)
Creatinine, Ser: 5.98 mg/dL — ABNORMAL HIGH (ref 0.50–1.10)
GFR calc Af Amer: 9 mL/min — ABNORMAL LOW (ref >90)
GFR calc non Af Amer: 8 mL/min — ABNORMAL LOW (ref >90)
Glucose, Bld: 69 mg/dL — ABNORMAL LOW (ref 70–99)
Phosphorus: 4.9 mg/dL — ABNORMAL HIGH (ref 2.3–4.6)
Potassium: 4 mEq/L (ref 3.5–5.1)
Sodium: 133 mEq/L — ABNORMAL LOW (ref 135–145)

## 2011-10-16 LAB — GLUCOSE, CAPILLARY
Glucose-Capillary: 67 mg/dL — ABNORMAL LOW (ref 70–99)
Glucose-Capillary: 85 mg/dL (ref 70–99)
Glucose-Capillary: 74 mg/dL (ref 70–99)
Glucose-Capillary: 90 mg/dL (ref 70–99)

## 2011-10-16 MED ORDER — ONDANSETRON HCL 4 MG/2ML IJ SOLN
INTRAMUSCULAR | Status: AC
  Filled 2011-10-16: qty 2

## 2011-10-16 MED ORDER — OXYCODONE HCL 5 MG PO TABS
ORAL_TABLET | ORAL | Status: AC
  Administered 2011-10-16: 10 mg via ORAL
  Filled 2011-10-16: qty 2
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
## 2011-10-16 NOTE — Care Management Note (Signed)
Patient ID: [name], female   DOB: 10/30/1970, 41 y.o.   MRN: [id number] Word from Leonides Grills scheduler] is that pt will be having HD OP at [facility] 2nd shift MWF.  Will begin OP HD on Monday.  She should have HD in hospital Friday & then d/c home w/ parents.

## 2011-10-16 NOTE — Patient Care Conference (Addendum)
Inpatient RehabilitationTeam Conference Note Date: 10/15/2011   Time: 2:10 PM    Patient Name: [name]      Medical Record Number: [id number]  Date of Birth: October 06, 1970 Sex: Female         Room/Bed: 4149/4149-01 Payor Info: Payor:     Admitting Diagnosis: New ESRD,PNA,Deconditioned  Admit Date/Time:  10/08/2011  4:00 PM Admission Comments: No comment available   Primary Diagnosis:  Physical deconditioning Principal Problem: Physical deconditioning  Patient Active Problem List  Diagnoses Date Noted  . Physical deconditioning 10/09/2011  . HTN (hypertension), malignant 09/27/2011  . Chronic kidney disease, stage 4, severely decreased GFR 09/27/2011  . PNA (pneumonia) 09/27/2011  . Acute respiratory failure with hypoxia 09/27/2011  . VOMITING 12/06/2009  . GOUT 12/21/2008  . DIABETES MELLITUS II, UNCOMPLICATED [id number]  . HYPERLIPIDEMIA 08/14/2006  . OBESITY, NOS 08/14/2006  . HYPERTENSION, BENIGN SYSTEMIC 08/14/2006  . FIBROADENOSIS, BREAST 08/14/2006  . Irregular Menstrual Cycle 08/14/2006    Expected Discharge Date: Expected Discharge Date:  (5/3 vs 10/19/11 depends on OP HD schedule)  Team Members Present: Physician: Dr. Alger Simons Case Manager Present: Lutricia Feil, RN Social Worker Present: Lennart Pall, LCSW Nurse Present: Janyth Pupa, RN PT Present: Canary Brim, PT OT Present: Chrys Racer, OT SLP Present: Weston Anna, SLP Other (Discipline and Name): Daiva Nakayama, PPS Coordinator     Current Status/Progress Goal Weekly Team Focus  Medical   graft site looks good. labile bp. anxiety, diabetes  regulation of bp  sx management, working on regular po intake   Bowel/Bladder   Continent of bowel. HD-T, Th, Sat-Oliguria  Continent of bowel  Monitor   Swallow/Nutrition/ Hydration             ADL's   supervision with occasional min A with dizziness episodes  supervision overall  safety, family educaiton   Mobility   close S to min A based on  dizziness, increased unsteadiness with dizziness episodes, decreased balance strategies; significant BP changes with positional change despite TEDs andbinder  mod I w/c, S gait and tx  safety, balance   Communication             Safety/Cognition/ Behavioral Observations            Pain   No c/o pain  <3  Monitor   Skin   CDI  CDI  Monitor      *See Interdisciplinary Assessment and Plan and progress notes for long and short-term goals  Barriers to Discharge: bp and related symptoms, anxiety    Possible Resolutions to Barriers:  supervision at home    Discharge Planning/Teaching Needs:  home with parents - they are to provide 24/7 asssitance, but need to complete family ed.  Will schedule ASAP      Team Discussion: Medication adjustments for BP as needed [continues to fluctuate]--using abdominal binder & ted hose when OOB.  Even w/ BP issues pt is progressing--need family ed for d/c.  Goals over all downgraded to Supervision.   Revisions to Treatment Plan: Added hshold w/c goal & d/c'd community gait goal d/t BP issues.    Continued Need for Acute Rehabilitation Level of Care: The patient requires daily medical management by a physician with specialized training in physical medicine and rehabilitation for the following conditions: Daily direction of a multidisciplinary physical rehabilitation program to ensure safe treatment while eliciting the highest outcome that is of practical value to the patient.: Yes Daily medical management of patient stability for increased activity during participation  in an intensive rehabilitation regime.: Yes Daily analysis of laboratory values and/or radiology reports with any subsequent need for medication adjustment of medical intervention for : Post surgical problems;Other  [name] 10/16/2011, 11:36 AM

## 2011-10-16 NOTE — Progress Notes (Signed)
Occupational Therapy Note  Patient Details  Name: [name] MRN: [id number] Date of Birth: 1971/04/30 Today's Date: 10/16/2011  Time: 1300-1355 Pt denies pain Group Therapy  Pt participated in activities group with focus on dynamic standing balance and home mgmt tasks inclduing retrieving items from floor.  Pt did not c/o of any dizziness or light headedness throughout session.  BP ranged from 189/79 at beginning of group activity to 126/77 at end of session.  Pt completed all activities with close supervision.  Pt exhibited LOB X 1 but self corrected.   Leotis Shames [facility] 10/16/2011, 2:39 PM

## 2011-10-16 NOTE — Procedures (Signed)
Pt seen on HD.  AP 190 Vp150.  Tolerating HD well.  HAs spot at [facility] on mwf so will plan HD Friday and then could be DC if that is the plan from rehab.

## 2011-10-16 NOTE — Progress Notes (Signed)
Patient ID: [name], female   DOB: [birth year], 41 y.o.   MRN: [id number] Patient ID: [name], female   DOB: 02/21/1971, 41 y.o.   MRN: [id number] Patient ID: [name], female   DOB: Apr 23, 1971, 42 y.o.   MRN: [id number] Subjective/Complaints: Review of Systems : No bm. Nausea with small amount of emesis this am. Didn't get HD last noc Respiratory: Positive for cough.   Neurological: Positive for dizziness and weakness.     Objective: Vital Signs: Blood pressure 141/68, pulse 83, temperature 98.1 F (36.7 C), temperature source Oral, resp. rate 18, height 5\' 2"  (1.575 m), weight 65.1 kg (143 lb 8.3 oz), last menstrual period 09/16/2011, SpO2 94.00%. No results found. No results found for this basename: WBC:2,HGB:2,HCT:2,PLT:2 in the last 72 hours No results found for this basename: NA:2,K:2,CL:2,CO2:2,GLUCOSE:2,BUN:2,CREATININE:2,CALCIUM:2 in the last 72 hours CBG (last 3)   Basename 10/16/11 0722 10/16/11 0704 10/15/11 2055  GLUCAP 85 67* 111*    Wt Readings from Last 3 Encounters:  10/16/11 65.1 kg (143 lb 8.3 oz)  10/08/11 57.9 kg (127 lb 10.3 oz)  10/08/11 57.9 kg (127 lb 10.3 oz)    Physical Exam:  General appearance: alert, cooperative and no distress. Head: Normocephalic, without obvious abnormality, atraumatic Eyes: conjunctivae/corneas clear. PERRL, EOM's intact. Fundi benign. Ears: normal TM's and external ear canals both ears Nose: Nares normal. Septum midline. Mucosa normal. No drainage or sinus tenderness. Throat: lips, mucosa, and tongue normal; teeth and gums normal Neck: no adenopathy, no carotid bruit, no JVD, supple, symmetrical, trachea midline and thyroid not enlarged, symmetric, no tenderness/mass/nodules Back: symmetric, no curvature. ROM normal. No CVA tenderness. Resp: clear to auscultation bilaterally Cardio: regular rate and rhythm, S1, S2 normal, no murmur, click, rub or gallop GI: soft, non-tender; bowel sounds normal; no masses,  no  organomegaly Extremities: extremities normal, atraumatic, no cyanosis or edema Pulses: 2+ and symmetric Skin: Skin color, texture, turgor normal. No rashes or lesions Neurologic: Grossly normal-generalized trunk and proximal ext weakness. Cognitively intact. CN norml.  Incision/Wound: LUE incisions clean, dry , and intact. Chest sites clean.    Assessment/Plan: 1. Functional deficits secondary to deconditioning after pneumonia/respiratory failure/ acute on chronic renal failure which require 3+ hours per day of interdisciplinary therapy in a comprehensive inpatient rehab setting. Physiatrist is providing close team supervision and 24 hour management of active medical problems listed below. Physiatrist and rehab team continue to assess barriers to discharge/monitor patient progress toward functional and medical goals. FIM: FIM - Bathing Bathing Steps Patient Completed: Chest;Right Arm;Left Arm;Abdomen;Front perineal area;Buttocks;Right upper leg;Left upper leg Bathing: 4: Min-Patient completes 8-9 10f 10 parts or 75+ percent  FIM - Upper Body Dressing/Undressing Upper body dressing/undressing steps patient completed: Thread/unthread right sleeve of pullover shirt/dresss;Thread/unthread left sleeve of pullover shirt/dress;Put head through opening of pull over shirt/dress;Pull shirt over trunk Upper body dressing/undressing: 5: Supervision: Safety issues/verbal cues FIM - Lower Body Dressing/Undressing Lower body dressing/undressing steps patient completed: Thread/unthread right underwear leg;Thread/unthread left underwear leg;Pull underwear up/down;Pull pants up/down;Thread/unthread left pants leg;Thread/unthread right pants leg Lower body dressing/undressing: 4: Min-Patient completed 75 plus % of tasks  FIM - Toileting Toileting steps completed by patient: Adjust clothing prior to toileting;Performs perineal hygiene;Adjust clothing after toileting Toileting Assistive Devices: Grab bar or rail  for support Toileting: 4: Steadying assist  FIM - Radio producer Devices: Bedside commode Toilet Transfers: 4-To toilet/BSC: Min A (steadying Pt. > 75%);4-From toilet/BSC: Min A (steadying Pt. > 75%)  FIM - Engineer, site  Assistive Devices: Arm rests Bed/Chair Transfer: 7: Independent: No helper;4: Bed > Chair or W/C: Min A (steadying Pt. > 75%)  FIM - Locomotion: Wheelchair Distance: household 60 ft, hospital environement 150 x 2 Locomotion: Wheelchair: 6: Travels 150 ft or more, turns around, maneuvers to table, bed or toilet, negotiates 3% grade: maneuvers on rugs and over door sills independently FIM - Locomotion: Ambulation Locomotion: Ambulation Assistive Devices: Other (comment) (HHA) Ambulation/Gait Assistance: 5: Supervision Locomotion: Ambulation: 2: Travels 50 - 149 ft with supervision/safety issues  Comprehension Comprehension Mode: Auditory Comprehension: 7-Follows complex conversation/direction: With no assist  Expression Expression Mode: Verbal Expression: 7-Expresses complex ideas: With no assist  Social Interaction Social Interaction: 6-Interacts appropriately with others with medication or extra time (anti-anxiety, antidepressant).  Problem Solving Problem Solving: 7-Solves complex problems: Recognizes & self-corrects  Memory Memory: 7-Complete Independence: No helper  1. Deconditioning after pneumonia with severe chronic kidney disease  2. DVT Prophylaxis/Anticoagulation: SCDs. Monitor for any signs of deep vein thrombosis  3. ID/pneumonia. Completes Levaquin 10/15/11. F/u cxr stable. Skin clear. Continues with leukocytosis 4. End-stage renal disease. Patient with dialysis initiated during this hospital stay. Weigh patient daily. Vascular surgery to followup for permanent access. Following volumes also.   -pushed back to today for HD 5. Hypertension. Change catapres to 0.2mg  and continue hydralazine 50 mg 4  times a day, bystolic 20 mg daily.  -bp's seem to be more consistent.  -dizzy when volume drops (ie with diarrhea) or with bp spikes  -encouraged adequate dietary/fluid intake (didn't eat well yesterday)  -TTH and will add binder  -consider midodrine trial if orthostasis becomes a problem again.   6. Diabetes mellitus. Hemoglobin A1c 6.1. Dropped hs lantus  Encourage adequate intake. Adjust regimen as indicated. 7. Chronic anemia hemoglobin 7.5-9.3. Followup labs with hemodialysis/transfused 2 units 10/08/2011- epo/iron per cka 8. Diarrhea- improved- needs something now for constipation.   -some of her nausea frankly may be anxiety related as well.    LOS (Days) 8 A FACE TO FACE EVALUATION WAS PERFORMED  Derwood Becraft T 10/16/2011, 8:58 AM

## 2011-10-16 NOTE — Progress Notes (Signed)
Occupational Therapy Note  Patient Details  Name: [name] MRN: [id number] Date of Birth: [birth year] Today's Date: 10/16/2011  Pt missed 45 mins skilled OT services secondary to nausea and vomiting when sitting EOB.  RN notified and attended to pt at bedside.   Leotis Shames [facility] 10/16/2011, 8:18 AM

## 2011-10-16 NOTE — Progress Notes (Signed)
Social Work  Patient ID: [name], female   DOB: [birth year], 41 y.o.   MRN: [id number]  Met with patient and spoke with her mother via phone this morning.  Both aware and agreeable to target d/c 5/3, however, understand this date may change dependent on finalized HD schedule.   Pt to d/c to parents' home until she feels ready to return to her own home alone.  Parents to be in tomorrow for family education.  Mother also requests that she be able to speak with staff about pt's medications and dietary needs as well as with MDs about her medical status.  Will alert appropriate staff about mother's requests.   Have also spoken with Medicaid office about status of her case, but still awaiting confirmation that she is active with [place]. (pt aware)  Will continue to follow for d/c planning needs and support.  Syeda Prickett

## 2011-10-16 NOTE — Progress Notes (Addendum)
Physical Therapy Session Note  Patient Details  Name: [name] MRN: [id number] Date of Birth: [birth year]  Today's Date: 10/16/2011 Time:  1015- 11:00  45 min Session  Short Term Goals: Week 1:  PT Short Term Goal 1 (Week 1): same as LTGs  Skilled Therapeutic Interventions/Progress Updates:    Gait training x 140' min assist for occasional loss of balance particularly with visual scanning. Balance training on [employer] with Maze game and then random control pt demonstrating improvements with balance control performing at 50%, 60% then 75% . Gait training x 140' with min assist performing horizontal and vertical head movements then varying speed and direction for challenge. Static balance with narrow stance + visual scanning and head movements. At very end of session pt suddenly became sick with one bout of vomiting, BP=157/80. RN made aware.    Skilled Therapeutic Interventions/Progress Updates second session: Time:  [id number]   45 min Session Wheelchair mobility in household environment/bathroom/sink areas, modified independent. Gait training x 150', supervision with no challenges however min assist with horizontal and vertical head movements and changes in gait speed. Dynamic ball throw/catch balance activities with narrow and modified tandem base of support, up to min assist. Static balance on compliant surface with min assist, cues for righting reactions. Slow step ups with one rail 5 x each LE. Pt became fatigued after one set. Pt receiving extended rest between all activities, pt requesting to continue with each. BP at end of session 149/79.  Pt tolerating therapy well throughout entire session again however once back to room pt became nauseated and vomited. RN in room, pt returned to bed to rest. RN to monitor.  Therapy Documentation Precautions:  Precautions Precautions: Fall Precaution Comments: binder and TEDS, orthostatic- go by pt tolerance Restrictions Weight Bearing  Restrictions: No Vital Signs: Therapy Vitals Temp: 98.5 F (36.9 C) Temp src: Oral Pulse Rate: 81  Resp: 19  BP: 130/78 mmHg Patient Position, if appropriate: Sitting Pain: No c/o pain with either session  See FIM for current functional status  Therapy/Group: Individual Therapy  Lahoma Rocker 10/16/2011, 10:43 AM

## 2011-10-17 LAB — GLUCOSE, CAPILLARY
Glucose-Capillary: 82 mg/dL (ref 70–99)
Glucose-Capillary: 99 mg/dL (ref 70–99)
Glucose-Capillary: 105 mg/dL — ABNORMAL HIGH (ref 70–99)
Glucose-Capillary: 134 mg/dL — ABNORMAL HIGH (ref 70–99)

## 2011-10-17 NOTE — Progress Notes (Signed)
Nutrition Follow-up/Consult  RD consulted for diet education. RD has completed education on 2 occasions. Mother of pt requesting formal education by RD prior to d/c. This RD called mother and discussed plans for education with RD tomorrow prior to d/c, mother in agreement of plan. Pt denies any specific questions/concerns regarding renal diet at this time.  Intake remains highly variable, wt trending down as well. Encouraged pt to eat.  Diet Order:  Renal 80 - 90 Supplement: Resource Breeze daily  Meds: Scheduled Meds:   . cloNIDine  0.2 mg Transdermal Weekly  . cloNIDine  0.1 mg Oral Once  . feeding supplement  1 Container Oral Q24H  . hydrALAZINE  50 mg Oral Q6H  . insulin aspart  0-5 Units Subcutaneous QHS  . multivitamin  1 tablet Oral Daily  . nebivolol  20 mg Oral Daily   Continuous Infusions:  PRN Meds:.acetaminophen, heparin, ondansetron (ZOFRAN) IV, ondansetron, ondansetron, oxyCODONE, polyethylene glycol, sorbitol  Labs:  CMP     Component Value Date/Time   NA 133* 10/16/2011 1608   K 4.0 10/16/2011 1608   CL 95* 10/16/2011 1608   CO2 27 10/16/2011 1608   GLUCOSE 69* 10/16/2011 1608   GLUCOSE 318 03/31/2009 0815   BUN 23 10/16/2011 1608   CREATININE 5.98* 10/16/2011 1608   CALCIUM 8.8 10/16/2011 1608   CALCIUM 8.5 04/02/2011 0514   PROT 6.7 09/27/2011 1926   ALBUMIN 2.8* 10/16/2011 1608   AST 21 09/27/2011 1926   ALT 10 09/27/2011 1926   ALKPHOS 58 09/27/2011 1926   BILITOT 0.2* 09/27/2011 1926   GFRNONAA 8* 10/16/2011 1608   GFRAA 9* 10/16/2011 1608  Phosphorus 4.9H  CBG (last 3)   Basename 10/17/11 1149 10/17/11 0734 10/16/11 2042  GLUCAP 99 82 90     Intake/Output Summary (Last 24 hours) at 10/17/11 1244 Last data filed at 10/16/11 2015  Gross per 24 hour  Intake    240 ml  Output   1685 ml  Net  -1445 ml   Weight Status:  58 kg s/p HD 5/1 - wt trending down 64.6 kg s/p HD on 4/30 - stable  Estimated needs:  1830 - 2130 kcal, 73 - 85 grams protein  Nutrition Dx:   Increased nutrient needs r/t HD AEB estimated needs. Ongoing.  Goal:  Pt to consume >/= 75% of meals and supplements.  Intervention:  [employer] scheduled daily. RD to continue to follow and educate.  Monitor:  PO intake, weights, labs, I/O's  Asencion Partridge Pager #:  [phone]

## 2011-10-17 NOTE — Progress Notes (Signed)
Physical Therapy Session Note  Patient Details  Name: [name] MRN: [id number] Date of Birth: May 28, 1971  Today's Date: 10/17/2011 Time: [id number] Time Calculation (min):  74min  Short Term Goals: Week 1:  PT Short Term Goal 1 (Week 1): same as LTGs  Skilled Therapeutic Interventions/Progress Updates:    Family education with pt's father and mother. Educated on importance of TED and abdominal binder for BP control at home, educated on donning each. Demonstrated wheelchair parts utilization and safety. Educated on positioning for safety/supervision when pt ambulating, mother demonstrating technique with pt x 100'. Educated on positioning and safety with ascent/descent of steps, father providing min-guard assist x 2 steps, mother for another two steps, verbal cues for positioning of caregiver for optimal safety. Discussed need for slow increase in activity, importance of setting up a walking plan with HHPT. Rest of session focused on dynamic at static balance. Gait training x 150' with visual scanning/head movements, up to min assist with challenges. Ball toss with varying base of support, up to min assist for balance. Practiced picking objects up from floor slowly utilizing LEs, no c/o dizziness. Home environment ambulation with tight space and obstacle negotiation, back walking. Pt supervision level. Pt allowed extra rest between activities, no c/o dizziness or lightheadedness throughout session. No nausea today.  Therapy Documentation Precautions:  Precautions Precautions: Fall Precaution Comments: binder and TEDS, orthostatic- go by pt tolerance  Restrictions Weight Bearing Restrictions: No Pain: Pain Assessment Pain Assessment: No/denies pain  :    Trunk/Postural Assessment : Cervical Assessment Cervical Assessment: Within Functional Limits Thoracic Assessment Thoracic Assessment: Within Functional Limits Lumbar Assessment Lumbar Assessment: Within Functional Limits Postural  Control Postural Control: Within Functional Limits   See FIM for current functional status  Therapy/Group: Individual Therapy  Lahoma Rocker 10/17/2011, 12:08 PM

## 2011-10-17 NOTE — Progress Notes (Signed)
Occupational Therapy Session Note  Patient Details  Name: [name] MRN: [id number] Date of Birth: 24-May-1971  Today's Date: 10/17/2011 Time: 0900-0925 Time Calculation (min): 25 min   Skilled Therapeutic Interventions/Progress Updates: Patient seen this am for 1:1 OT session.  Patient sleeping soundly yet was agreeable to get up for brief session.  Patient stated that BP issues seemed to be more stable than before.  Patient sat at edge of bed and within 30 seconds reported dizziness and had to lie back down.  Patient's nurse reported she needed abdominal binder / ted hose for any amount of upright position, even encouraged binder and ted hose to be applied in supine.  Patient required increased time to recover, and allowed to remain supine.  Discussed feasibility of shower when upright positions not tolerated without external support.  Patient expressed understanding.  Family is scheduled to be here for hands on training all morning, in preparation for upcoming discharge.     Therapy Documentation Precautions:  Precautions Precautions: Fall Precaution Comments: binder and TEDS, orthostatic- go by pt tolerance Restrictions Weight Bearing Restrictions: No   Pain:  No report of pain    See FIM for current functional status  Therapy/Group: Individual Therapy  [name] 10/17/2011, 10:22 AM

## 2011-10-17 NOTE — Progress Notes (Signed)
Physical Therapy Discharge Summary  Patient Details  Name: [name] MRN: [id number] Date of Birth: [birth year]  Today's Date: 10/17/2011 Time: [id number] Time Calculation (min): 57 min  Patient has met 8 of 8 long term goals due to improved activity tolerance, improved balance, improved postural control and increased functional strength.  Patient to discharge at an ambulatory level Supervision, with use of wheelchair if feeling weak or having poor BP control post dialysis.   Patient's care partner is independent to provide the necessary physical assistance at discharge.  Reasons goals not met: NA  Recommendation:  Patient will benefit from ongoing skilled PT services in home health setting to continue to advance safe functional mobility, address ongoing impairments in static and dynamic balance and decreased functional independence, and minimize fall risk.  Equipment: wheelchair, shower chair, bedside commode  Reasons for discharge: treatment goals met and discharge from hospital  Patient/family agrees with progress made and goals achieved: Yes  PT Discharge Precautions/Restrictions Precautions Precaution Comments: binder and TEDS, orthostatic- go by pt tolerance  Vital Signs Therapy Vitals Temp: 98.3 F (36.8 C) Temp src: Oral Pulse Rate: 77  Resp: 18  BP: 162/72 mmHg Patient Position, if appropriate: Sitting Oxygen Therapy SpO2: 97 % O2 Device: None (Room air) Pain Pain Assessment Pain Assessment: No/denies pain Sensation Sensation Light Touch: Appears Intact Coordination Gross Motor Movements are Fluid and Coordinated: Yes Fine Motor Movements are Fluid and Coordinated: Yes Motor  Motor Motor: Within Functional Limits  Mobility Transfers Sit to Stand: 5: Supervision;With upper extremity assist Sit to Stand Details (indicate cue type and reason): Supervision for safety given h/o ligtheadness and instability with this symptom Stand to Sit: 5: Supervision;With  upper extremity assist Stand to Sit Details: Supervision for safety Locomotion  Ambulation Ambulation/Gait Assistance: 5: Supervision Ambulation Distance (Feet): 300 Feet (at least during session, multiple rest breaks as needed) Assistive device: None High Level Ambulation High Level Ambulation: Side stepping;Backwards walking;Sudden stops;Head turns;Direction changes Stairs / Additional Locomotion Stairs: Yes Stairs Assistance: 5: Supervision Stairs Assistance Details (indicate cue type and reason): Supervision for safety, needs railing on Lt. Pt and familiy reporting pt will have rail at home.  Stair Management Technique: One rail Left Number of Stairs: 6  Height of Stairs: 6  Wheelchair Mobility Wheelchair Mobility: Yes Wheelchair Assistance: 6: Modified independent (Device/Increase time) Environmental health practitioner: Both upper extremities Wheelchair Parts Management: Independent Distance: Household 100', hospital environment >200'  Trunk/Postural Assessment    WFL Balance Balance Balance Assessed: Yes Berg Balance Test Sit to Stand: Able to stand without using hands and stabilize independently Standing Unsupported: Able to stand safely 2 minutes Sitting with Back Unsupported but Feet Supported on Floor or Stool: Able to sit safely and securely 2 minutes Stand to Sit: Sits safely with minimal use of hands Transfers: Able to transfer safely, minor use of hands Standing Unsupported with Eyes Closed: Able to stand 10 seconds safely Standing Ubsupported with Feet Together: Able to place feet together independently and stand 1 minute safely From Standing, Reach Forward with Outstretched Arm: Can reach confidently >25 cm (10") From Standing Position, Pick up Object from Floor: Able to pick up shoe, needs supervision From Standing Position, Turn to Look Behind Over each Shoulder: Looks behind from both sides and weight shifts well Turn 360 Degrees: Able to turn 360 degrees safely but  slowly Standing Unsupported, Alternately Place Feet on Step/Stool: Able to stand independently and complete 8 steps >20 seconds Standing Unsupported, One Foot in Front: Able to plae foot  ahead of the other independently and hold 30 seconds Standing on One Leg: Able to lift leg independently and hold equal to or more than 3 seconds Total Score: 49  Extremity Assessment      RLE Assessment RLE Assessment: Within Functional Limits (grossly 4-5/5, functionally weak) LLE Assessment LLE Assessment: Within Functional Limits (grossly 4-5/5, functionally weak)  Treatment session:  No c/o pain. Pt with new wheelchair, PT adjusted leg rests however appeared to have a good fit otherwise. Pt reports wheelchair very comfortable. Performed stairs and car transfer for assessment of goals (supervision level). Performed Berg balance testing scoring 49/56 which improved from 21/56 on 10/09/11 which indicates reduction in risk of falls. Pt was close supervision with obstacle course working on obstacle negotiation, forward/back walking on solid and compliant surfaces. Static balance with narrow and modified tandem stance on compliant surface min assist needed for loss of balance particularly when head movements added. Dynamic gait balance with head turns, speed changes, quick turns, sudden stops requiring up to min assist although significant improvement noted. Slight dizziness at end of session, resolved with seated rest. BP 172/81. Pt with no further questions, feels safe going home.   See FIM for current functional status  Lahoma Rocker 10/17/2011, 5:26 PM

## 2011-10-17 NOTE — Progress Notes (Signed)
S: no new CO. Plan for DC in AM O:BP 152/72  Pulse 87  Temp(Src) 99.1 F (37.3 C) (Oral)  Resp 19  Ht 5\' 2"  (1.575 m)  Wt 57.9 kg (127 lb 10.3 oz)  BMI 23.35 kg/m2  SpO2 97%  LMP 09/16/2011  Intake/Output Summary (Last 24 hours) at 10/17/11 0901 Last data filed at 10/16/11 2015  Gross per 24 hour  Intake    240 ml  Output   1685 ml  Net  -1445 ml   Weight change: -0.1 kg (-3.5 oz) [id number] and alert [facility]:RRR Resp:clear Abd:+BS NTND Ext:no edema NEURO:Ox3  No asterixis      . cloNIDine  0.2 mg Transdermal Weekly  . cloNIDine  0.1 mg Oral Once  . feeding supplement  1 Container Oral Q24H  . hydrALAZINE  50 mg Oral Q6H  . insulin aspart  0-5 Units Subcutaneous QHS  . multivitamin  1 tablet Oral Daily  . nebivolol  20 mg Oral Daily   No results found. BMET    Component Value Date/Time   NA 133* 10/16/2011 1608   K 4.0 10/16/2011 1608   CL 95* 10/16/2011 1608   CO2 27 10/16/2011 1608   GLUCOSE 69* 10/16/2011 1608   GLUCOSE 318 03/31/2009 0815   BUN 23 10/16/2011 1608   CREATININE 5.98* 10/16/2011 1608   CALCIUM 8.8 10/16/2011 1608   CALCIUM 8.5 04/02/2011 0514   GFRNONAA 8* 10/16/2011 1608   GFRAA 9* 10/16/2011 1608   CBC    Component Value Date/Time   WBC 15.0* 10/12/2011 1532   RBC 3.93 10/12/2011 1532   HGB 11.4* 10/12/2011 1532   HCT 33.5* 10/12/2011 1532   PLT 170 10/12/2011 1532   MCV 85.2 10/12/2011 1532   MCH 29.0 10/12/2011 1532   MCHC 34.0 10/12/2011 1532   RDW 15.6* 10/12/2011 1532   LYMPHSABS 1.1 09/27/2011 1505   MONOABS 0.5 09/27/2011 1505   EOSABS 0.0 09/27/2011 1505   BASOSABS 0.0 09/27/2011 1505     Assessment:  1. ESRD sec HTN 2. DM 3. HTN 4. Anemia not on EPO 5. Sec HPTH not requiring Vit D   Plan: 1. HD tomorrow.   Has a spot at [facility] MWF 2nd shift.  She needs to be at the unit by 10:30.  Will call [facility] to get a game plan for new access.  [name] T

## 2011-10-17 NOTE — Progress Notes (Signed)
Subjective/Complaints: Review of Systems : Had 2 bm's yesterday. Feels good today Respiratory: Positive for cough.   Neurological: Positive for dizziness and weakness.     Objective: Vital Signs: Blood pressure 152/72, pulse 87, temperature 99.1 F (37.3 C), temperature source Oral, resp. rate 19, height 5\' 2"  (1.575 m), weight 57.9 kg (127 lb 10.3 oz), last menstrual period 09/16/2011, SpO2 97.00%. No results found. No results found for this basename: WBC:2,HGB:2,HCT:2,PLT:2 in the last 72 hours  Basename 10/16/11 1608  NA 133*  K 4.0  CL 95*  CO2 27  GLUCOSE 69*  BUN 23  CREATININE 5.98*  CALCIUM 8.8   CBG (last 3)   Basename 10/17/11 0734 10/16/11 2042 10/16/11 1132  GLUCAP 82 90 74    Wt Readings from Last 3 Encounters:  10/16/11 57.9 kg (127 lb 10.3 oz)  10/08/11 57.9 kg (127 lb 10.3 oz)  10/08/11 57.9 kg (127 lb 10.3 oz)    Physical Exam:  General appearance: alert, cooperative and no distress. Head: Normocephalic, without obvious abnormality, atraumatic Eyes: conjunctivae/corneas clear. PERRL, EOM's intact. Fundi benign. Ears: normal TM's and external ear canals both ears Nose: Nares normal. Septum midline. Mucosa normal. No drainage or sinus tenderness. Throat: lips, mucosa, and tongue normal; teeth and gums normal Neck: no adenopathy, no carotid bruit, no JVD, supple, symmetrical, trachea midline and thyroid not enlarged, symmetric, no tenderness/mass/nodules Back: symmetric, no curvature. ROM normal. No CVA tenderness. Resp: clear to auscultation bilaterally Cardio: regular rate and rhythm, S1, S2 normal, no murmur, click, rub or gallop GI: soft, non-tender; bowel sounds normal; no masses,  no organomegaly Extremities: extremities normal, atraumatic, no cyanosis or edema Pulses: 2+ and symmetric Skin: Skin color, texture, turgor normal. No rashes or lesions Neurologic: Grossly normal-generalized trunk and proximal ext weakness. Cognitively intact. CN  norml.  Incision/Wound: LUE incisions clean, dry , and intact. 5/2  Assessment/Plan: 1. Functional deficits secondary to deconditioning after pneumonia/respiratory failure/ acute on chronic renal failure which require 3+ hours per day of interdisciplinary therapy in a comprehensive inpatient rehab setting. Physiatrist is providing close team supervision and 24 hour management of active medical problems listed below. Physiatrist and rehab team continue to assess barriers to discharge/monitor patient progress toward functional and medical goals. FIM: FIM - Bathing Bathing Steps Patient Completed: Chest;Right Arm;Left Arm;Abdomen;Front perineal area;Buttocks;Right upper leg;Left upper leg Bathing: 4: Min-Patient completes 8-9 78f 10 parts or 75+ percent  FIM - Upper Body Dressing/Undressing Upper body dressing/undressing steps patient completed: Thread/unthread right sleeve of pullover shirt/dresss;Thread/unthread left sleeve of pullover shirt/dress;Put head through opening of pull over shirt/dress;Pull shirt over trunk Upper body dressing/undressing: 5: Supervision: Safety issues/verbal cues FIM - Lower Body Dressing/Undressing Lower body dressing/undressing steps patient completed: Thread/unthread right underwear leg;Thread/unthread left underwear leg;Pull underwear up/down;Pull pants up/down;Thread/unthread left pants leg;Thread/unthread right pants leg Lower body dressing/undressing: 4: Min-Patient completed 75 plus % of tasks  FIM - Toileting Toileting steps completed by patient: Adjust clothing prior to toileting;Performs perineal hygiene;Adjust clothing after toileting Toileting Assistive Devices: Grab bar or rail for support Toileting: 4: Steadying assist  FIM - Radio producer Devices: Bedside commode Toilet Transfers: 4-To toilet/BSC: Min A (steadying Pt. > 75%);4-From toilet/BSC: Min A (steadying Pt. > 75%)  FIM - Bed/Chair Transfer Bed/Chair Transfer  Assistive Devices: Arm rests Bed/Chair Transfer: 7: Independent: No helper;4: Bed > Chair or W/C: Min A (steadying Pt. > 75%)  FIM - Locomotion: Wheelchair Distance: household 80 ft, hospital environment 150 x 2 Locomotion: Wheelchair: 6: [employer]  150 ft or more, turns around, maneuvers to table, bed or toilet, negotiates 3% grade: maneuvers on rugs and over door sills independently FIM - Locomotion: Ambulation Locomotion: Ambulation Assistive Devices: Other (comment) (HHA) Ambulation/Gait Assistance: 5: Supervision Locomotion: Ambulation: 5: Travels 150 ft or more with supervision/safety issues  Comprehension Comprehension Mode: Auditory Comprehension: 7-Follows complex conversation/direction: With no assist  Expression Expression Mode: Verbal Expression: 7-Expresses complex ideas: With no assist  Social Interaction Social Interaction: 6-Interacts appropriately with others with medication or extra time (anti-anxiety, antidepressant).  Problem Solving Problem Solving: 7-Solves complex problems: Recognizes & self-corrects  Memory Memory: 7-Complete Independence: No helper  1. Deconditioning after pneumonia with severe chronic kidney disease  2. DVT Prophylaxis/Anticoagulation: SCDs. Monitor for any signs of deep vein thrombosis  3. ID/pneumonia. Completes Levaquin 10/15/11. F/u cxr stable. Skin clear. Continues with leukocytosis 4. End-stage renal disease. Patient with dialysis initiated during this hospital stay. Weigh patient daily. Vascular surgery to followup for permanent access. Following volumes also.   -HD Friday after dc 5. Hypertension. Changed catapres to 0.2mg  and continue hydralazine 50 mg q6, bystolic 20 mg daily.  -bp's seem to be more consistent.  -dizzy when volume drops (ie with diarrhea) or with bp spikes  -encouraged adequate dietary/fluid intake  -TTH and abd binde  6. Diabetes mellitus. Hemoglobin A1c 6.1. Dropped hs lantus  Encourage adequate intake. Adjust  regimen as indicated. 7. Chronic anemia hemoglobin 7.5-9.3. Followup labs with hemodialysis/transfused 2 units 10/08/2011- epo/iron per cka 8. Diarrhea- resolvd. 2bm's yest  -some of her nausea frankly may be anxiety related as well.    LOS (Days) 9 A FACE TO FACE EVALUATION WAS PERFORMED  Adelheid Hoggard T 10/17/2011, 8:42 AM

## 2011-10-17 NOTE — Progress Notes (Signed)
Occupational Therapy Discharge Summary  Patient Details  Name: [name] MRN: [id number] Date of Birth: Mar 20, 1971  Today's Date: 10/17/2011  1st Session Time: 1101-1055 Time Calculation (min): 54min Pt's parents present for education and participation in therapy.  Pt's mom present for toilet transfer, toileting, bathing and dressing. Pt's Mom and Dad present for and assisted with tub transfer by stepping over edge of tub.  Discussed with pt and parents equipment recommendations and told them equipment would be delivered to room for them to take home.  Pt exhibited two episodes of dizziness but resolved in approx 3 mins (BP remained stable during these episodes).  Discussed with parents that patient has been educated about self-monitoring and she knows to sit and rest when she feels dizzy or unusually tired.  Mom instructed on applying abdominal binder and the purpose of abdominal binder and TED hose as it pertains to stabilizing BP.  Mom and Dad verbalized and demonstrated understanding of all areas educated in.  Discharge Summary Patient has met 8 of 8 long term goals due to improved activity tolerance, improved balance and ability to compensate for deficits.  Pt continues to exhibit fluctuating BP resulting in periodic dizziness and n/v.  Pt performs all BADLs (bathing, dressing, toilet transfers, tub transfers) with supervision with occasional steady assist when experiencing dizziness while ambulating.  Pt requires steady assist for simple kitchen activities when standing and when seated in w/c pt's diminutive height is restrictive in performing tasks at counter level. Patient's parents have been present for education and have verbalized/demonstrated understanding in areas covered. Patient to discharge at overall Supervision level.  Patient's care partner is independent to provide the necessary physical assistance at discharge.    Reasons goals not met: NA  Recommendation:  Patient does not  require follow up OT services at this time.  Equipment: BSC, tub seat  Reasons for discharge: treatment goals met and discharge from hospital  Patient/family agrees with progress made and goals achieved: Yes  OT Discharge Precautions/Restrictions  Precautions Precaution Comments: binder and TEDS, orthostatic- go by pt tolerance  General   Vital Signs   Pain Pain Assessment Pain Assessment: No/denies pain ADL ADL Eating: Independent Where Assessed-Eating: Chair Grooming: Modified independent Where Assessed-Grooming: Sitting at sink Upper Body Bathing: Supervision/safety Where Assessed-Upper Body Bathing: Sitting at sink Lower Body Bathing: Supervision/safety Where Assessed-Lower Body Bathing: Sitting at sink;Standing at sink Upper Body Dressing: Supervision/safety Where Assessed-Upper Body Dressing: Sitting at sink Lower Body Dressing: Supervision/safety Where Assessed-Lower Body Dressing: Standing at sink;Sitting at sink Toileting: Supervision/safety Where Assessed-Toileting: Glass blower/designer: Close supervision Toilet Transfer Method: Counselling psychologist: Radiographer, therapeutic: Close supervison Clinical cytogeneticist Method: Optometrist: Civil engineer, contracting with back Vision/Perception  Vision - History Baseline Vision: Wears glasses all the time Patient Visual Report: No change from baseline Vision - Assessment Eye Alignment: Within Functional Limits  Cognition Overall Cognitive Status: Appears within functional limits for tasks assessed Arousal/Alertness: Awake/alert Orientation Level: Oriented X4 Sensation Sensation Light Touch: Appears Intact Coordination Gross Motor Movements are Fluid and Coordinated: Yes Fine Motor Movements are Fluid and Coordinated: Yes Motor  Motor Motor: Within Functional Limits Mobility     Trunk/Postural Assessment  Cervical Assessment Cervical Assessment: Within Functional  Limits Thoracic Assessment Thoracic Assessment: Within Functional Limits Lumbar Assessment Lumbar Assessment: Within Functional Limits Postural Control Postural Control: Within Functional Limits  Balance Static Sitting Balance Static Sitting - Balance Support: No upper extremity supported Static Sitting - Level of Assistance: 7: Independent Extremity/Trunk  Assessment RUE Assessment RUE Assessment: Within Functional Limits LUE Assessment LUE Assessment: Within Functional Limits  See FIM for current functional status  Leroy Libman 10/17/2011, 1:38 PM

## 2011-10-18 ENCOUNTER — Inpatient Hospital Stay (HOSPITAL_COMMUNITY): Payer: Medicaid Other

## 2011-10-18 LAB — RENAL FUNCTION PANEL
Albumin: 2.4 g/dL — ABNORMAL LOW (ref 3.5–5.2)
BUN: 14 mg/dL (ref 6–23)
CO2: 29 mEq/L (ref 19–32)
Calcium: 8.6 mg/dL (ref 8.4–10.5)
Chloride: 97 mEq/L (ref 96–112)
Creatinine, Ser: 4.39 mg/dL — ABNORMAL HIGH (ref 0.50–1.10)
GFR calc Af Amer: 13 mL/min — ABNORMAL LOW (ref >90)
GFR calc non Af Amer: 11 mL/min — ABNORMAL LOW (ref >90)
Glucose, Bld: 82 mg/dL (ref 70–99)
Phosphorus: 4.2 mg/dL (ref 2.3–4.6)
Potassium: 3.9 mEq/L (ref 3.5–5.1)
Sodium: 134 mEq/L — ABNORMAL LOW (ref 135–145)

## 2011-10-18 LAB — GLUCOSE, CAPILLARY: Glucose-Capillary: 87 mg/dL (ref 70–99)

## 2011-10-18 LAB — CBC
HCT: 28.1 % — ABNORMAL LOW (ref 36.0–46.0)
Hemoglobin: 9.2 g/dL — ABNORMAL LOW (ref 12.0–15.0)
MCH: 28.5 pg (ref 26.0–34.0)
MCHC: 32.7 g/dL (ref 30.0–36.0)
MCV: 87 fL (ref 78.0–100.0)
Platelets: 162 10*3/uL (ref 150–400)
RBC: 3.23 MIL/uL — ABNORMAL LOW (ref 3.87–5.11)
RDW: 15.6 % — ABNORMAL HIGH (ref 11.5–15.5)
WBC: 8.8 10*3/uL (ref 4.0–10.5)

## 2011-10-18 MED ORDER — CLONIDINE HCL 0.2 MG/24HR TD PTWK: 1.0000 | MEDICATED_PATCH | TRANSDERMAL | Status: DC

## 2011-10-18 MED ORDER — DARBEPOETIN ALFA-POLYSORBATE 40 MCG/0.4ML IJ SOLN
INTRAMUSCULAR | Status: AC
  Administered 2011-10-18: 40 ug via INTRAVENOUS
  Filled 2011-10-18: qty 0.4

## 2011-10-18 MED ORDER — RENA-VITE PO TABS: 1.0000 | ORAL_TABLET | Freq: Every day | ORAL | Status: DC

## 2011-10-18 MED ORDER — NEBIVOLOL HCL 20 MG PO TABS: 20.0000 mg | ORAL_TABLET | Freq: Every day | ORAL | Status: DC

## 2011-10-18 MED ORDER — DARBEPOETIN ALFA-POLYSORBATE 40 MCG/0.4ML IJ SOLN
40.0000 ug | INTRAMUSCULAR | Status: DC
  Administered 2011-10-18: 40 ug via INTRAVENOUS

## 2011-10-18 MED ORDER — HYDRALAZINE HCL 50 MG PO TABS: 50.0000 mg | ORAL_TABLET | Freq: Four times a day (QID) | ORAL | Status: DC

## 2011-10-18 MED ORDER — CLONIDINE HCL 0.2 MG/24HR TD PTWK: 4.0000 | MEDICATED_PATCH | TRANSDERMAL | Status: DC

## 2011-10-18 MED ORDER — OXYCODONE HCL 5 MG PO TABS: 5.0000 mg | ORAL_TABLET | ORAL | Status: AC | PRN

## 2011-10-18 NOTE — Progress Notes (Addendum)
Social Work  Discharge Note  The overall goal for the admission was met for:   Discharge location: Yes - home with parents  Length of Stay: Yes - 10 days  Discharge activity level: Yes - modified independent - supervision  Home/community participation: Yes  Services provided included: MD, RD, PT, OT, RN, CM, TR, Pharmacy and SW  Financial Services: Medicaid  Follow-up services arranged: Home Health: RN and PT via [facility], DME: 18x16 [place] w/c, cushion,3n1 and tub seat -[place] and Patient/Family has no preference for HH/DME agencies  Comments (or additional information):  Patient/Family verbalized understanding of follow-up arrangements: Yes  Individual responsible for coordination of the follow-up plan: patient with support from parents  Confirmed correct DME delivered: Aesha Agrawal 10/18/2011    [place], [place]    Notified by [facility] that pt does not meet Medicaid's diagnosis criteria to receive PT services, therefore, will only receive RN services.

## 2011-10-18 NOTE — Discharge Summary (Signed)
[name], [name] NO.:  [id number]  MEDICAL RECORD NO.:  [id number]  LOCATION:  [id number]                         FACILITY:  [facility]  PHYSICIAN:  [name], M.D.DATE OF BIRTH:  03-02-1971  DATE OF ADMISSION:  10/08/2011 DATE OF DISCHARGE:  10/18/2011                              DISCHARGE SUMMARY   DISCHARGE DIAGNOSES: 1. Deconditioning after pneumonia with severe chronic kidney disease. 2. Sequential compression devices for deep vein thrombosis     prophylaxis. 3. Pneumonia with Levaquin completed on October 15, 2011. 4. End-stage renal disease with hemodialysis initiated during this     hospital stay. 5. Hypertension. 6. Diabetes mellitus. 7. Chronic anemia.  HISTORY:  A 41 year old right-handed [place] American female with history of chronic renal disease, creatinine 5-6, followed by [facility].  Currently, not on dialysis.  Admitted on April 12 with productive cough and shortness of breath that progressively worsened over a 4-day period.  She was placed on CPAP due to oxygen saturations in the 80s in the emergency department.  Chest x-ray revealed bilateral lower lobe with early right upper lobe pneumonia. Placed on broad-spectrum antibiotics.  Renal Service consulted, was ultimately placed on dialysis therapy.  The patient had undergone an AVG per Dr. Doren [name], but unfortunately developed ischemic Steal syndrome, required removal of AV graft on April 19 per Dr. Oneida [name].  Her latest creatinine is 3.76.  Dialysis had been initiated.  Her respiratory status continued to improve.  She was completing a 7-day course of Levaquin.  She was admitted for comprehensive rehab program.  PAST MEDICAL HISTORY:  See discharge diagnoses.  SOCIAL HISTORY:  Lives alone.  She has supportive parents in the area.  FUNCTIONAL HISTORY PRIOR TO ADMISSION:  Independent using a cane at times.  FUNCTIONAL STATUS UPON ADMISSION TO REHAB SERVICES:  Minimum to  moderate assist for activities of daily living, mobility with limited endurance.  PHYSICAL EXAMINATION:  VITAL SIGNS:  Blood pressure 146/68, pulse 77, respirations 18, temperature 98.3. GENERAL:  This was an alert female, oriented x3.  Well developed. LUNGS:  Decreased breath sounds.  Clear to auscultation. CARDIAC: Regular rate and rhythm. ABDOMEN:  Soft, nontender.  Good bowel sounds. NEUROLOGIC:  Motor strength 4/5.  Bilateral hip flexor, knee extensors, ankle dorsiflexion 5/5 in the bilateral deltoid biceps, triceps.  REHABILITATION HOSPITAL COURSE:  The patient was admitted to inpatient rehab services with therapies initiated on a 3-hour daily basis consisting of physical therapy, occupational therapy, and rehabilitation nursing.  The following issues were addressed during the patient's rehabilitation stay.  Pertaining to [name]'s deconditioning after pneumonia with severe chronic kidney disease remained stable.  Her oxygen saturations remained in 90s.  Lungs remained clear to auscultation.  Latest chest x-ray on October 11, 2011, remained stable with no new focal or acute abnormalities identified.  Dialysis has since been initiated followed by [facility] with latest creatinine of 5.98.  Arrangements were made for outpatient dialysis as per [facility].  Blood pressures remained somewhat labile, but monitored closely.  She was now on a Catapres patch 0.2 mg weekly as well as hydralazine and Bystolic.  Her blood pressures were monitored closely.  She  had no chest pain or shortness of breath.  She completed her course of Levaquin for recent pneumonia.  As noted above, her oxygen saturations remained greater than 90%.  She did have a hemoglobin A1c of 6.1.  Blood sugars overall are well controlled.  She was on a renal diabetic diet.  Dietitian had been consulted for necessary diet teaching.  The patient received weekly  collaborative interdisciplinary team conferences to discuss estimated length of stay, family teaching, and any barriers to her discharge.  She was continent of bowel.  Dialysis days Tuesday Thursday, Saturday.  Supervision with occasional minimal assist for activities of daily living, close supervision to minimal assist for some unsteadiness and gait for her overall mobility.  Full family teaching was completed.  She will be discharged to home with her parents.  It was advised to continue abdominal binder when out of bed for maintenance of labile blood pressure.  DISCHARGE MEDICATIONS: 1. Catapres patch 0.2 mg weekly. 2. Hydralazine 50 mg every 6 hours. 3. Multivitamin daily. 4. Bystolic 20 mg daily. 5. Oxycodone immediate release 5-10 mg every 4 hours as needed pain,     dispense of 60 tablets.  DIET:  A diabetic renal diet.  SPECIAL INSTRUCTIONS:  The patient should continue with dialysis Monday, Wednesday, Friday second shift at [facility] for ongoing dialysis.  The patient should follow up with Dr. Joseph [name], medical management; Dr. Tawni [name] at the outpatient rehab service office as needed and Dr. Fleet [name] of [facility] for ongoing maintenance of her dialysis.     [name], P.A.   ______________________________ [name], M.D.    DA/MEDQ  D:  10/18/2011  T:  10/18/2011  Job:  [id number]  cc:   [name], M.D. [name], M.D.

## 2011-10-18 NOTE — Discharge Instructions (Signed)
Inpatient Rehab Discharge Instructions  [name] Discharge date and time: No discharge date for patient encounter.   Activities/Precautions/ Functional Status: Activity: activity as tolerated Diet: renal diet Wound Care: none needed Functional status:  ___ No restrictions     ___ Walk up steps independently __x_ 24/7 supervision/assistance   ___ Walk up steps with assistance ___ Intermittent supervision/assistance  ___ Bathe/dress independently ___ Walk with walker     ___ Bathe/dress with assistance ___ Walk Independently    ___ Shower independently _x__ Walk with assistance    _x__ Shower with assistance ___ No alcohol     ___ Return to work/school ________  COMMUNITY REFERRALS UPON DISCHARGE:    Home Health:   PT   RN                        Agency: [facility] Phone: [phone]   Medical Equipment/Items Ordered: wheelchair, cushion and commode                                                     Agency/Supplier: [facility] [phone]   GENERAL COMMUNITY RESOURCES FOR PATIENT/FAMILY:  Support Groups: Available at [place] - speak with HD Social Worker     Special Instructions: Continue dialysis as advised per [facility] [phone] Discuss with renal services when aspirin 81 mg daily can be resumed  My questions have been answered and I understand these instructions. I will adhere to these goals and the provided educational materials after my discharge from the hospital.  Patient/Caregiver Signature _______________________________ Date __________  Clinician Signature _______________________________________ Date __________  Please bring this form and your medication list with you to all your follow-up doctor's appointments.

## 2011-10-18 NOTE — Procedures (Signed)
Pt seen on HD.  Ap 190  Vp 130.  Will start aranesp.  Plan for DC today.  To fu at [facility] center on Mon at 10:30 am

## 2011-10-18 NOTE — Discharge Summary (Signed)
  Discharge summary job [id number]

## 2011-10-18 NOTE — Progress Notes (Signed)
Pt was given all discharge orders by Linna Hoff PA  Family presents

## 2011-10-18 NOTE — Progress Notes (Signed)
Subjective/Complaints: Review of Systems :  Feels good today and anxious for discharge home. Denies any dizziness today Respiratory: Positive for cough.   Neurological: Positive  weakness.     Objective: Vital Signs: Blood pressure 146/68, pulse 77, temperature 98.3 F (36.8 C), temperature source Oral, resp. rate 18, height 5\' 2"  (1.575 m), weight 57.9 kg (127 lb 10.3 oz), last menstrual period 09/16/2011, SpO2 97.00%. No results found. No results found for this basename: WBC:2,HGB:2,HCT:2,PLT:2 in the last 72 hours  Basename 10/16/11 1608  NA 133*  K 4.0  CL 95*  CO2 27  GLUCOSE 69*  BUN 23  CREATININE 5.98*  CALCIUM 8.8   CBG (last 3)   Basename 10/17/11 2129 10/17/11 1632 10/17/11 1149  GLUCAP 134* 105* 99    Wt Readings from Last 3 Encounters:  10/16/11 57.9 kg (127 lb 10.3 oz)  10/08/11 57.9 kg (127 lb 10.3 oz)  10/08/11 57.9 kg (127 lb 10.3 oz)    Physical Exam:  General appearance: alert, cooperative and no distress. Head: Normocephalic, without obvious abnormality, atraumatic Eyes: conjunctivae/corneas clear. PERRL, EOM's intact. Fundi benign. Ears: normal TM's and external ear canals both ears Nose: Nares normal. Septum midline. Mucosa normal. No drainage or sinus tenderness. Throat: lips, mucosa, and tongue normal; teeth and gums normal Neck: no adenopathy, no carotid bruit, no JVD, supple, symmetrical, trachea midline and thyroid not enlarged, symmetric, no tenderness/mass/nodules Back: symmetric, no curvature. ROM normal. No CVA tenderness. Resp: clear to auscultation bilaterally Cardio: regular rate and rhythm, S1, S2 normal, no murmur, click, rub or gallop GI: soft, non-tender; bowel sounds normal; no masses,  no organomegaly Extremities: extremities normal, atraumatic, no cyanosis or edema Pulses: 2+ and symmetric Skin: Skin color, texture, turgor normal. No rashes or lesions Neurologic: Grossly normal-generalized trunk and proximal ext weakness.  Cognitively intact. CN norml.  Incision/Wound: LUE incisions clean, dry , and intact. 5/2  Assessment/Plan: 1. Functional deficits secondary to deconditioning after pneumonia/respiratory failure/ acute on chronic renal failure which require 3+ hours per day of interdisciplinary therapy in a comprehensive inpatient rehab setting. Physiatrist is providing close team supervision and 24 hour management of active medical problems listed below. Physiatrist and rehab team continue to assess barriers to discharge/monitor patient progress toward functional and medical goals. FIM: FIM - Bathing Bathing Steps Patient Completed: Chest;Right Arm;Left Arm;Abdomen;Front perineal area;Buttocks;Right upper leg;Left upper leg;Left lower leg (including foot);Right lower leg (including foot) Bathing: 5: Supervision: Safety issues/verbal cues  FIM - Upper Body Dressing/Undressing Upper body dressing/undressing steps patient completed: Thread/unthread right sleeve of pullover shirt/dresss;Thread/unthread left sleeve of pullover shirt/dress;Put head through opening of pull over shirt/dress;Pull shirt over trunk Upper body dressing/undressing: 5: Supervision: Safety issues/verbal cues FIM - Lower Body Dressing/Undressing Lower body dressing/undressing steps patient completed: Thread/unthread right underwear leg;Thread/unthread left underwear leg;Pull underwear up/down;Pull pants up/down;Thread/unthread left pants leg;Thread/unthread right pants leg;Don/Doff right sock;Fasten/unfasten pants;Don/Doff left sock;Don/Doff right shoe Lower body dressing/undressing: 5: Supervision: Safety issues/verbal cues  FIM - Toileting Toileting steps completed by patient: Adjust clothing prior to toileting;Performs perineal hygiene;Adjust clothing after toileting Toileting Assistive Devices: Grab bar or rail for support Toileting: 5: Supervision: Safety issues/verbal cues  FIM - Radio producer Devices:  Elevated toilet seat Toilet Transfers: 5-To toilet/BSC: Supervision (verbal cues/safety issues);5-From toilet/BSC: Supervision (verbal cues/safety issues)  FIM - Engineer, site Assistive Devices: Arm rests Bed/Chair Transfer: 5: Bed > Chair or W/C: Supervision (verbal cues/safety issues);5: Chair or W/C > Bed: Supervision (verbal cues/safety issues)  FIM - Locomotion:  Wheelchair Distance: Household 100', hospital environment >200' Locomotion: Wheelchair: 6: Travels 150 ft or more, turns around, maneuvers to table, bed or toilet, negotiates 3% grade: maneuvers on rugs and over door sills independently FIM - Locomotion: Ambulation Locomotion: Ambulation Assistive Devices: Other (comment) (HHA) Ambulation/Gait Assistance: 5: Supervision Locomotion: Ambulation: 5: Travels 150 ft or more with supervision/safety issues  Comprehension Comprehension Mode: Auditory Comprehension: 7-Follows complex conversation/direction: With no assist  Expression Expression Mode: Verbal Expression: 7-Expresses complex ideas: With no assist  Social Interaction Social Interaction: 6-Interacts appropriately with others with medication or extra time (anti-anxiety, antidepressant).  Problem Solving Problem Solving: 7-Solves complex problems: Recognizes & self-corrects  Memory Memory: 7-Complete Independence: No helper  1. Deconditioning after pneumonia with severe chronic kidney disease . For family teaching has been completed with discharge home today after dialysis 2. DVT Prophylaxis/Anticoagulation: SCDs. Monitor for any signs of deep vein thrombosis  3. ID/pneumonia. Completes Levaquin 10/15/11. F/u cxr stable. Skin clear. Continues with leukocytosis. She is afebrile without shortness of breath or cough 4. End-stage renal disease. Patient with dialysis initiated during this hospital stay. Latest creatinine 5.98 on 10/16/11. Weigh patient daily. Vascular surgery to followup for permanent  access. Following volumes also.   -HD Friday after dc. Spoke with Dr. Mercy Moore and arrangements have been made for outpatient dialysis. All issues in regards to this were discussed with patient and parents 5. Hypertension. Changed catapres to 0.2mg  and continue hydralazine 50 mg q6, bystolic 20 mg daily.  -bp's seem to be more consistent and better control. Continue current medical regimen at this time  -dizzy when volume drops (ie with diarrhea) or with bp spikes  -encouraged adequate dietary/fluid intake  -TTH and abd binde  6. Diabetes mellitus. Hemoglobin A1c 6.1. Dropped hs lantus  Encourage adequate intake. Adjust regimen as indicated. Dietitian did meet with patient and family to discuss diabetic renal diet 7. Chronic anemia hemoglobin 7.5-9.3. Followup labs with hemodialysis/transfused 2 units 10/08/2011- epo/iron per cka 8. Diarrhea- resolvd. 2bm's yest  -some of her nausea frankly may be anxiety related as well.    LOS (Days) 10 A FACE TO FACE EVALUATION WAS PERFORMED   Oval Linsey, MD Lauraine Rinne J. 10/18/2011, 6:51 AM

## 2011-10-22 ENCOUNTER — Encounter: Payer: Self-pay | Admitting: Vascular Surgery

## 2011-10-22 ENCOUNTER — Telehealth: Payer: Self-pay | Admitting: Physical Medicine & Rehabilitation

## 2011-10-22 NOTE — Telephone Encounter (Addendum)
Please advise. She was a patient in the hospital. She doesn't have a hospital F/U scheduled.  SHE DOESN'T HAVE AN APPT  BECAUSE I DIDN'T SET HER UP.  SHE SHOULD SEE HER SURGEON AND RENAL DOCTORS. THIS IS NOT A NEED TO SEE ME--ZTS

## 2011-10-22 NOTE — Telephone Encounter (Signed)
Need a return to work date.

## 2011-10-23 ENCOUNTER — Encounter: Payer: Self-pay | Admitting: Vascular Surgery

## 2011-10-23 ENCOUNTER — Ambulatory Visit (INDEPENDENT_AMBULATORY_CARE_PROVIDER_SITE_OTHER): Payer: Medicaid Other | Admitting: Vascular Surgery

## 2011-10-23 ENCOUNTER — Telehealth: Payer: Self-pay | Admitting: Physical Medicine & Rehabilitation

## 2011-10-23 VITALS — BP 189/91 | HR 70 | Resp 18 | Ht 62.0 in | Wt 127.0 lb

## 2011-10-23 DIAGNOSIS — N186 End stage renal disease: Secondary | ICD-10-CM | POA: Insufficient documentation

## 2011-10-23 NOTE — Progress Notes (Signed)
Vascular and Vein Specialist of [facility]  Patient name: [name] MRN: [id number] DOB: January 12, 1971 Sex: female  REASON FOR VISIT: evaluate for new access.  HPI: [name] is a 41 y.o. female who I placed a right IJ dietetic catheter and a left upper arm AV graft on on 10/04/2011. She did not have any usable veins in the left arm for a fistula. Her brachial artery was very small. Used to 4-7 mm tapered graft and left essentially all the 4 mm end of the graft. Despite this she developed a steal and this graft was removed by Dr. Oneida Alar. She was sent back to be evaluated for new access. She dialyzes on Monday Wednesdays and Fridays. She's had no recent uremic symptoms. She denies nausea, vomiting, fatigue, anorexia, or palpitations.  I reviewed her vein mapping from when she was in the hospital. It appears that her right forearm cephalic vein although small might potentially be usable for a right radiocephalic AV fistula. I think this is worth trying although she is clearly at increased risk for steal because of the small size of her arteries. If this were not an option she could potentially have an upper arm loop on the right if the artery was more reasonable in size at this level.   REVIEW OF SYSTEMS: Valu.Nieves ] denotes positive finding; [  ] denotes negative finding  CARDIOVASCULAR:  [ ]  chest pain   [ ]  dyspnea on exertion    CONSTITUTIONAL:  [ ]  fever   [ ]  chills  PHYSICAL EXAM: Filed Vitals:   10/23/11 0913  BP: 189/91  Pulse: 70  Resp: 18  Height: 5\' 2"  (1.575 m)  Weight: 127 lb (57.607 kg)   Body mass index is 23.23 kg/(m^2). GENERAL: The patient is a well-nourished female, in no acute distress. The vital signs are documented above. CARDIOVASCULAR: There is a regular rate and rhythm  PULMONARY: There is good air exchange bilaterally without wheezing or rales. She has a palpable right radial pulse although slightly diminished. She has a diminished left radial pulse also.  MEDICAL  ISSUES: She is scheduled for placement of a right radiocephalic fistula or right AV graft on 10/29/2011. Have discussed the procedure and potential complications including the risk for steel in the right arm also. All of her questions were answered. She is agreeable to proceed.  [facility] [place] [facility]: [phone]

## 2011-10-23 NOTE — Telephone Encounter (Signed)
Pt mother aware.

## 2011-10-23 NOTE — Telephone Encounter (Signed)
Needs verbal order for 4 wheeled walker.  [phone]

## 2011-10-24 ENCOUNTER — Other Ambulatory Visit: Payer: Self-pay

## 2011-10-24 ENCOUNTER — Encounter (HOSPITAL_COMMUNITY): Payer: Self-pay | Admitting: Pharmacy Technician

## 2011-10-24 NOTE — Telephone Encounter (Signed)
Verbal order given to Meg.

## 2011-10-28 ENCOUNTER — Encounter (HOSPITAL_COMMUNITY): Payer: Self-pay | Admitting: *Deleted

## 2011-10-28 MED ORDER — CEFAZOLIN SODIUM 1-5 GM-% IV SOLN
1.0000 g | Freq: Once | INTRAVENOUS | Status: AC
  Administered 2011-10-29: 1 g via INTRAVENOUS
  Filled 2011-10-28: qty 50

## 2011-10-29 ENCOUNTER — Ambulatory Visit (HOSPITAL_COMMUNITY)
Admission: RE | Admit: 2011-10-29 | Discharge: 2011-10-29 | Disposition: A | Discharge status: Home / Self Care | Payer: Medicaid Other | Source: Ambulatory Visit | Attending: Vascular Surgery | Admitting: Vascular Surgery

## 2011-10-29 ENCOUNTER — Ambulatory Visit (HOSPITAL_COMMUNITY): Payer: Medicaid Other | Admitting: Certified Registered Nurse Anesthetist

## 2011-10-29 ENCOUNTER — Encounter (HOSPITAL_COMMUNITY)
Admission: RE | Disposition: A | Discharge status: Home / Self Care | Payer: Self-pay | Source: Ambulatory Visit | Attending: Vascular Surgery

## 2011-10-29 ENCOUNTER — Encounter (HOSPITAL_COMMUNITY): Payer: Self-pay | Admitting: Certified Registered Nurse Anesthetist

## 2011-10-29 ENCOUNTER — Encounter (HOSPITAL_COMMUNITY): Payer: Self-pay | Admitting: *Deleted

## 2011-10-29 ENCOUNTER — Telehealth: Payer: Self-pay | Admitting: Vascular Surgery

## 2011-10-29 DIAGNOSIS — I509 Heart failure, unspecified: Secondary | ICD-10-CM | POA: Insufficient documentation

## 2011-10-29 DIAGNOSIS — N185 Chronic kidney disease, stage 5: Secondary | ICD-10-CM

## 2011-10-29 DIAGNOSIS — N186 End stage renal disease: Secondary | ICD-10-CM

## 2011-10-29 DIAGNOSIS — Z992 Dependence on renal dialysis: Secondary | ICD-10-CM | POA: Insufficient documentation

## 2011-10-29 DIAGNOSIS — E119 Type 2 diabetes mellitus without complications: Secondary | ICD-10-CM | POA: Insufficient documentation

## 2011-10-29 DIAGNOSIS — I120 Hypertensive chronic kidney disease with stage 5 chronic kidney disease or end stage renal disease: Secondary | ICD-10-CM | POA: Insufficient documentation

## 2011-10-29 HISTORY — PX: AV FISTULA PLACEMENT: SHX1204

## 2011-10-29 LAB — HCG, SERUM, QUALITATIVE: Preg, Serum: NEGATIVE

## 2011-10-29 LAB — GLUCOSE, CAPILLARY
Glucose-Capillary: 106 mg/dL — ABNORMAL HIGH (ref 70–99)
Glucose-Capillary: 95 mg/dL (ref 70–99)

## 2011-10-29 LAB — POCT I-STAT 4, (NA,K, GLUC, HGB,HCT)
Glucose, Bld: 124 mg/dL — ABNORMAL HIGH (ref 70–99)
HCT: 40 % (ref 36.0–46.0)
Hemoglobin: 13.6 g/dL (ref 12.0–15.0)
Potassium: 4 mEq/L (ref 3.5–5.1)
Sodium: 137 mEq/L (ref 135–145)

## 2011-10-29 SURGERY — ARTERIOVENOUS (AV) FISTULA CREATION
Anesthesia: Monitor Anesthesia Care | Site: Arm Lower | Laterality: Right | Wound class: Clean

## 2011-10-29 MED ORDER — SODIUM CHLORIDE 0.9 % IV SOLN
INTRAVENOUS | Status: DC
  Administered 2011-10-29: 12:00:00 via INTRAVENOUS

## 2011-10-29 MED ORDER — MIDAZOLAM HCL 2 MG/2ML IJ SOLN
INTRAMUSCULAR | Status: AC
  Filled 2011-10-29: qty 2

## 2011-10-29 MED ORDER — PROPOFOL 10 MG/ML IV EMUL
INTRAVENOUS | Status: DC | PRN
  Administered 2011-10-29: 75 ug/kg/min via INTRAVENOUS

## 2011-10-29 MED ORDER — LIDOCAINE HCL (CARDIAC) 20 MG/ML IV SOLN
INTRAVENOUS | Status: DC | PRN
  Administered 2011-10-29: 20 mg via INTRAVENOUS

## 2011-10-29 MED ORDER — SODIUM CHLORIDE 0.9 % IV SOLN: INTRAVENOUS | Status: DC | PRN

## 2011-10-29 MED ORDER — LIDOCAINE HCL (PF) 1 % IJ SOLN
INTRAMUSCULAR | Status: DC | PRN
  Administered 2011-10-29: 3 mL via INTRADERMAL

## 2011-10-29 MED ORDER — MIDAZOLAM HCL 2 MG/2ML IJ SOLN
1.0000 mg | INTRAMUSCULAR | Status: DC | PRN
  Administered 2011-10-29: 2 mg via INTRAVENOUS

## 2011-10-29 MED ORDER — 0.9 % SODIUM CHLORIDE (POUR BTL) OPTIME
TOPICAL | Status: DC | PRN
  Administered 2011-10-29: 1000 mL

## 2011-10-29 MED ORDER — MIDAZOLAM HCL 5 MG/5ML IJ SOLN
INTRAMUSCULAR | Status: DC | PRN
  Administered 2011-10-29: 2 mg via INTRAVENOUS

## 2011-10-29 MED ORDER — FENTANYL CITRATE 0.05 MG/ML IJ SOLN: 25.0000 ug | INTRAMUSCULAR | Status: DC | PRN

## 2011-10-29 MED ORDER — SODIUM CHLORIDE 0.9 % IR SOLN
Status: DC | PRN
  Administered 2011-10-29: 13:00:00

## 2011-10-29 MED ORDER — LORAZEPAM 2 MG/ML IJ SOLN: 1.0000 mg | Freq: Once | INTRAMUSCULAR | Status: DC | PRN

## 2011-10-29 MED ORDER — PROTAMINE SULFATE 10 MG/ML IV SOLN
INTRAVENOUS | Status: DC | PRN
  Administered 2011-10-29: 20 mg via INTRAVENOUS

## 2011-10-29 MED ORDER — FENTANYL CITRATE 0.05 MG/ML IJ SOLN
INTRAMUSCULAR | Status: DC | PRN
  Administered 2011-10-29: 50 ug via INTRAVENOUS

## 2011-10-29 MED ORDER — OXYCODONE-ACETAMINOPHEN 5-325 MG PO TABS: 1.0000 | ORAL_TABLET | ORAL | Status: AC | PRN

## 2011-10-29 MED ORDER — FENTANYL CITRATE 0.05 MG/ML IJ SOLN: 50.0000 ug | INTRAMUSCULAR | Status: DC | PRN

## 2011-10-29 SURGICAL SUPPLY — 36 items
ADH SKN CLS APL DERMABOND .7 (GAUZE/BANDAGES/DRESSINGS) ×1
CANISTER SUCTION 2500CC (MISCELLANEOUS) ×2 IMPLANT
CLIP TI MEDIUM 6 (CLIP) ×2 IMPLANT
CLIP TI WIDE RED SMALL 6 (CLIP) ×2 IMPLANT
CLOTH BEACON ORANGE TIMEOUT ST (SAFETY) ×2 IMPLANT
COVER PROBE W GEL 5X96 (DRAPES) ×2 IMPLANT
COVER SURGICAL LIGHT HANDLE (MISCELLANEOUS) ×4 IMPLANT
DECANTER SPIKE VIAL GLASS SM (MISCELLANEOUS) ×2 IMPLANT
DERMABOND ADVANCED (GAUZE/BANDAGES/DRESSINGS) ×1
DERMABOND ADVANCED .7 DNX12 (GAUZE/BANDAGES/DRESSINGS) ×1 IMPLANT
DRAIN PENROSE 1/2X12 LTX STRL (WOUND CARE) IMPLANT
ELECT REM PT RETURN 9FT ADLT (ELECTROSURGICAL) ×2
ELECTRODE REM PT RTRN 9FT ADLT (ELECTROSURGICAL) ×1 IMPLANT
GLOVE BIO SURGEON STRL SZ7.5 (GLOVE) ×2 IMPLANT
GLOVE BIOGEL PI IND STRL 6.5 (GLOVE) IMPLANT
GLOVE BIOGEL PI IND STRL 7.0 (GLOVE) IMPLANT
GLOVE BIOGEL PI IND STRL 7.5 (GLOVE) ×1 IMPLANT
GLOVE BIOGEL PI INDICATOR 6.5 (GLOVE) ×4
GLOVE BIOGEL PI INDICATOR 7.0 (GLOVE) ×1
GLOVE BIOGEL PI INDICATOR 7.5 (GLOVE) ×3
GOWN STRL NON-REIN LRG LVL3 (GOWN DISPOSABLE) ×4 IMPLANT
KIT BASIN OR (CUSTOM PROCEDURE TRAY) ×2 IMPLANT
KIT ROOM TURNOVER OR (KITS) ×2 IMPLANT
NS IRRIG 1000ML POUR BTL (IV SOLUTION) ×2 IMPLANT
PACK CV ACCESS (CUSTOM PROCEDURE TRAY) ×2 IMPLANT
PAD ARMBOARD 7.5X6 YLW CONV (MISCELLANEOUS) ×4 IMPLANT
SPONGE GAUZE 4X4 12PLY (GAUZE/BANDAGES/DRESSINGS) ×2 IMPLANT
SPONGE SURGIFOAM ABS GEL 100 (HEMOSTASIS) IMPLANT
SUT PROLENE 6 0 BV (SUTURE) ×2 IMPLANT
SUT VIC AB 3-0 SH 27 (SUTURE) ×2
SUT VIC AB 3-0 SH 27X BRD (SUTURE) ×1 IMPLANT
SUT VICRYL 4-0 PS2 18IN ABS (SUTURE) ×2 IMPLANT
TOWEL OR 17X24 6PK STRL BLUE (TOWEL DISPOSABLE) ×2 IMPLANT
TOWEL OR 17X26 10 PK STRL BLUE (TOWEL DISPOSABLE) ×2 IMPLANT
UNDERPAD 30X30 INCONTINENT (UNDERPADS AND DIAPERS) ×2 IMPLANT
WATER STERILE IRR 1000ML POUR (IV SOLUTION) ×2 IMPLANT

## 2011-10-29 NOTE — Anesthesia Procedure Notes (Signed)
Procedure Name: MAC Date/Time: 10/29/2011 1:14 PM Performed by: Mariea Clonts Pre-anesthesia Checklist: Patient identified, Emergency Drugs available, Suction available, Patient being monitored and Timeout performed Patient Re-evaluated:Patient Re-evaluated prior to inductionOxygen Delivery Method: Simple face mask

## 2011-10-29 NOTE — Anesthesia Preprocedure Evaluation (Addendum)
Anesthesia Evaluation  Patient identified by MRN, date of birth, ID band Patient awake    Reviewed: Allergy & Precautions, H&P , NPO status , Patient's Chart, lab work & pertinent test results, reviewed documented beta blocker date and time   Airway Mallampati: I TM Distance: >3 FB Neck ROM: Full    Dental   Pulmonary    Pulmonary exam normal       Cardiovascular hypertension, +CHF     Neuro/Psych    GI/Hepatic   Endo/Other  Diabetes mellitus-  Renal/GU ESRFRenal disease     Musculoskeletal   Abdominal   Peds  Hematology   Anesthesia Other Findings   Reproductive/Obstetrics                          Anesthesia Physical Anesthesia Plan  ASA: III  Anesthesia Plan: MAC   Post-op Pain Management:    Induction: Intravenous  Airway Management Planned: Mask  Additional Equipment:   Intra-op Plan:   Post-operative Plan:   Informed Consent: I have reviewed the patients History and Physical, chart, labs and discussed the procedure including the risks, benefits and alternatives for the proposed anesthesia with the patient or authorized representative who has indicated his/her understanding and acceptance.     Plan Discussed with: CRNA and Surgeon  Anesthesia Plan Comments:         Anesthesia Quick Evaluation

## 2011-10-29 NOTE — Discharge Instructions (Signed)
Instructions Following General Anesthetic, Adult A nurse specialized in giving anesthesia (anesthetist) or a doctor specialized in giving anesthesia (anesthesiologist) gave you a medicine that made you sleep while a procedure was performed. For as long as 24 hours following this procedure, you may feel:  Dizzy.   Weak.   Drowsy.  AFTER THE PROCEDURE After surgery, you will be taken to the recovery area where a nurse will monitor your progress. You will be allowed to go home when you are awake, stable, taking fluids well, and without complications. For the first 24 hours following an anesthetic:  Have a responsible person with you.   Do not drive a car. If you are alone, do not take public transportation.   Do not drink alcohol.   Do not take medicine that has not been prescribed by your caregiver.   Do not sign important papers or make important decisions.   You may resume normal diet and activities as directed.   Change bandages (dressings) as directed.   Only take over-the-counter or prescription medicines for pain, discomfort, or fever as directed by your caregiver.  If you have questions or problems that seem related to the anesthetic, call the hospital and ask for the anesthetist or anesthesiologist on call. SEEK IMMEDIATE MEDICAL CARE IF:   You develop a rash.   You have difficulty breathing.   You have chest pain.   You develop any allergic problems.  Document Released: 09/09/2000 Document Revised: 05/23/2011 Document Reviewed: 04/20/2007 [facility] Patient Information 2012 [employer].

## 2011-10-29 NOTE — Preoperative (Signed)
Beta Blockers   Reason not to administer Beta Blockers:Not Applicable 

## 2011-10-29 NOTE — Telephone Encounter (Addendum)
Message copied by Lujean Amel on Tue Oct 29, 2011  4:42 PM ------      Message from: Alfonso Patten      Created: Tue Oct 29, 2011  2:39 PM      Regarding: FW: charges and follow up                   ----- Message -----         From: Angelia Mould, MD         Sent: 10/29/2011   2:23 PM           To: Doyle Askew, Alfonso Patten, RN      Subject: charges and follow up                                    PROCEDURE: right brachiocephalic AV fistula            SURGEON: Judeth Cornfield. Scot Dock, MD, FACS            ASSIST: Leontine Locket, PA            She will need a follow up visit in 6 weeks to check on the maturation of her fistula. Thank you            CSD I scheduled an appt for pt to see csd on 12/11/11 at 1pm. I left message for pt and mailed appt letter also. Shawn Stall

## 2011-10-29 NOTE — H&P (View-Only) (Signed)
Vascular and Vein Specialist of [facility]  Patient name: [name] MRN: [id number] DOB: 1970-07-05 Sex: female  REASON FOR VISIT: evaluate for new access.  HPI: [name] is a 41 y.o. female who I placed a right IJ dietetic catheter and a left upper arm AV graft on on 10/04/2011. She did not have any usable veins in the left arm for a fistula. Her brachial artery was very small. Used to 4-7 mm tapered graft and left essentially all the 4 mm end of the graft. Despite this she developed a steal and this graft was removed by Dr. Oneida Alar. She was sent back to be evaluated for new access. She dialyzes on Monday Wednesdays and Fridays. She's had no recent uremic symptoms. She denies nausea, vomiting, fatigue, anorexia, or palpitations.  I reviewed her vein mapping from when she was in the hospital. It appears that her right forearm cephalic vein although small might potentially be usable for a right radiocephalic AV fistula. I think this is worth trying although she is clearly at increased risk for steal because of the small size of her arteries. If this were not an option she could potentially have an upper arm loop on the right if the artery was more reasonable in size at this level.   REVIEW OF SYSTEMS: Valu.Nieves ] denotes positive finding; [  ] denotes negative finding  CARDIOVASCULAR:  [ ]  chest pain   [ ]  dyspnea on exertion    CONSTITUTIONAL:  [ ]  fever   [ ]  chills  PHYSICAL EXAM: Filed Vitals:   10/23/11 0913  BP: 189/91  Pulse: 70  Resp: 18  Height: 5\' 2"  (1.575 m)  Weight: 127 lb (57.607 kg)   Body mass index is 23.23 kg/(m^2). GENERAL: The patient is a well-nourished female, in no acute distress. The vital signs are documented above. CARDIOVASCULAR: There is a regular rate and rhythm  PULMONARY: There is good air exchange bilaterally without wheezing or rales. She has a palpable right radial pulse although slightly diminished. She has a diminished left radial pulse also.  MEDICAL  ISSUES: She is scheduled for placement of a right radiocephalic fistula or right AV graft on 10/29/2011. Have discussed the procedure and potential complications including the risk for steel in the right arm also. All of her questions were answered. She is agreeable to proceed.  [place] [facility] [place] Beeper: [phone]

## 2011-10-29 NOTE — Anesthesia Postprocedure Evaluation (Signed)
  Anesthesia Post-op Note  Patient: [name]  Procedure(s) Performed: Procedure(s) (LRB): ARTERIOVENOUS (AV) FISTULA CREATION (Right)  Patient Location: PACU  Anesthesia Type: MAC  Level of Consciousness: awake  Airway and Oxygen Therapy: Patient Spontanous Breathing  Post-op Pain: mild  Post-op Assessment: Post-op Vital signs reviewed, Patient's Cardiovascular Status Stable, Respiratory Function Stable, Patent Airway, No signs of Nausea or vomiting and Pain level controlled  Post-op Vital Signs: stable  Complications: No apparent anesthesia complications

## 2011-10-29 NOTE — Interval H&P Note (Signed)
History and Physical Interval Note:  10/29/2011 12:42 PM  [name]  has presented today for surgery, with the diagnosis of End Stage Renal Disease  The various methods of treatment have been discussed with the patient and family. After consideration of risks, benefits and other options for treatment, the patient has consented to  Procedure(s) (LRB):ARTERIOVENOUS (AV) FISTULA CREATION (Right) as a surgical intervention .  The patients' history has been reviewed, patient examined, no change in status, stable for surgery.  I have reviewed the patients' chart and labs.  Questions were answered to the patient's satisfaction.     Latorsha Curling S

## 2011-10-29 NOTE — Op Note (Signed)
NAME: EVERLENE ITURRALDE   MRN: [id number] DOB: 1970/08/22    DATE OF OPERATION: 10/29/2011  PREOP DIAGNOSIS: Stage V chronic kidney disease  POSTOP DIAGNOSIS: Same  PROCEDURE: right brachiocephalic AV fistula  SURGEON: Judeth Cornfield. Scot Dock, MD, FACS  ASSIST: Leontine Locket, PA  ANESTHESIA: local with sedation   EBL: minimal  INDICATIONS: CAMERIN EWAN is a 41 y.o. female who is on dialysis currently. She has a functioning right IJ tunnel dialysis catheter. She had a left arm graft removed because of steal and presents for new access.  FINDINGS: her upper arm cephalic vein was [id number] mm. Her artery was 3 mm.  TECHNIQUE: The patient was brought to the operating room and sedated by anesthesia. The right upper extremity was prepped and draped in the usual sterile fashion. The cephalic vein was dissected free and ligated distally. It irrigated up nicely with heparinized saline and was approximately 3.5 mm in diameter. The brachial artery was dissected free beneath the fascia. Although somewhat small, it appeared reasonable in size for placement of a fistula. The patient was heparinized. The brachial artery was clamped proximally and distally and a longitudinal arteriotomy was made. The vein was mobilized over and sewn end-to-side to the artery using continuous 6-0 Prolene suture. At the completion there was an excellent thrill in the fistula. There was a good radial and ulnar signal with the Doppler at the completion. Hemostasis was obtained in the wound and the heparin was partially reversed with protamine. Wounds closed the deep layer of 3-0 Vicryl and the skin closed with 4-0 Vicryl. Dermabond was applied. The patient tolerated the procedure well and was transferred to the recovery room in stable condition. All needle and sponge counts were correct.  Deitra Mayo, MD, FACS [facility] Vein Specialists of [facility]  DATE OF DICTATION:   10/29/2011

## 2011-10-29 NOTE — Progress Notes (Signed)
Pt states has not had loose stools, no BM during post op stay

## 2011-10-29 NOTE — Transfer of Care (Signed)
Immediate Anesthesia Transfer of Care Note  Patient: [name]  Procedure(s) Performed: Procedure(s) (LRB): ARTERIOVENOUS (AV) FISTULA CREATION (Right)  Patient Location: PACU  Anesthesia Type: MAC  Level of Consciousness: awake, alert  and oriented  Airway & Oxygen Therapy: Patient Spontanous Breathing  Post-op Assessment: Report given to PACU RN and Post -op Vital signs reviewed and stable  Post vital signs: Reviewed and stable  Complications: No apparent anesthesia complications

## 2011-10-29 NOTE — Progress Notes (Signed)
Dialysis access report faxed to [facility].

## 2011-10-30 ENCOUNTER — Encounter (HOSPITAL_COMMUNITY): Payer: Self-pay | Admitting: Vascular Surgery

## 2011-11-14 ENCOUNTER — Telehealth: Payer: Self-pay

## 2011-11-14 NOTE — Telephone Encounter (Signed)
Needs verbal for weekly training on medication.

## 2011-11-14 NOTE — Telephone Encounter (Signed)
Verbal order given to Robin 

## 2011-12-10 ENCOUNTER — Encounter: Payer: Self-pay | Admitting: Vascular Surgery

## 2011-12-11 ENCOUNTER — Ambulatory Visit: Payer: Self-pay | Admitting: Vascular Surgery

## 2011-12-31 ENCOUNTER — Encounter: Payer: Self-pay | Admitting: Vascular Surgery

## 2012-01-01 ENCOUNTER — Ambulatory Visit: Payer: Self-pay | Admitting: Vascular Surgery

## 2012-01-13 ENCOUNTER — Other Ambulatory Visit: Payer: Self-pay | Admitting: Nephrology

## 2012-01-13 DIAGNOSIS — Z1231 Encounter for screening mammogram for malignant neoplasm of breast: Secondary | ICD-10-CM

## 2012-01-14 ENCOUNTER — Encounter: Payer: Self-pay | Admitting: Vascular Surgery

## 2012-01-14 ENCOUNTER — Other Ambulatory Visit: Payer: Self-pay | Admitting: Obstetrics & Gynecology

## 2012-01-14 ENCOUNTER — Other Ambulatory Visit (HOSPITAL_COMMUNITY)
Admission: RE | Admit: 2012-01-14 | Discharge: 2012-01-14 | Disposition: A | Discharge status: Home / Self Care | Payer: Medicare Other | Source: Ambulatory Visit | Attending: Obstetrics & Gynecology | Admitting: Obstetrics & Gynecology

## 2012-01-14 DIAGNOSIS — Z124 Encounter for screening for malignant neoplasm of cervix: Secondary | ICD-10-CM | POA: Insufficient documentation

## 2012-01-14 DIAGNOSIS — Z1151 Encounter for screening for human papillomavirus (HPV): Secondary | ICD-10-CM | POA: Insufficient documentation

## 2012-01-14 DIAGNOSIS — Z113 Encounter for screening for infections with a predominantly sexual mode of transmission: Secondary | ICD-10-CM | POA: Insufficient documentation

## 2012-01-15 ENCOUNTER — Encounter: Payer: Self-pay | Admitting: Vascular Surgery

## 2012-01-15 ENCOUNTER — Ambulatory Visit (INDEPENDENT_AMBULATORY_CARE_PROVIDER_SITE_OTHER): Payer: Medicare Other | Admitting: Vascular Surgery

## 2012-01-15 VITALS — BP 139/76 | HR 92 | Temp 97.8°F | Ht 62.0 in | Wt 119.0 lb

## 2012-01-15 DIAGNOSIS — N186 End stage renal disease: Secondary | ICD-10-CM

## 2012-01-15 NOTE — Progress Notes (Signed)
Vascular and Vein Specialist of [facility]  Patient name: [name] MRN: [id number] DOB: [birth year] Sex: female  REASON FOR VISIT: follow up after right brachiocephalic AV fistula.  HPI: [name] is a 41 y.o. female who had a right brachiocephalic AV fistula placed on 10/29/2011. She currently dialyzes on Tuesdays Thursdays and Saturdays with a dialysis catheter. She comes in for a 6 week follow up visit. She's had no pain in the right arm. She's had no pain or paresthesias in the hand.   REVIEW OF SYSTEMS: Valu.Nieves ] denotes positive finding; [  ] denotes negative finding  CARDIOVASCULAR:  [ ]  chest pain   [ ]  dyspnea on exertion    CONSTITUTIONAL:  [ ]  fever   [ ]  chills  PHYSICAL EXAM: Filed Vitals:   01/15/12 0903  BP: 139/76  Pulse: 92  Temp: 97.8 F (36.6 C)  TempSrc: Oral  Height: 5\' 2"  (1.575 m)  Weight: 119 lb (53.978 kg)  SpO2: 100%   Body mass index is 21.77 kg/(m^2). GENERAL: The patient is a well-nourished female, in no acute distress. The vital signs are documented above. CARDIOVASCULAR: There is a regular rate and rhythm  PULMONARY: There is good air exchange bilaterally without wheezing or rales. Her right upper arm fistula appears to be maturing adequately. She has a good thrill in the proximal fistula and the vein is gradually enlarging. The right hand is warm and well-perfused.  MEDICAL ISSUES:  End stage renal disease Her right brachiocephalic AV fistula which was placed on 10/29/2011 appears to be maturing gradually. I have ordered a follow duplex scan in 6 weeks and I will see her back at that time. If things looked good then that she can begin using her fistula for dialysis and once we know this is working her catheter can be removed. In addition she is going to be evaluated for a kidney transplant.   [place] [facility] [place] Beeper: [phone]

## 2012-01-15 NOTE — Assessment & Plan Note (Signed)
Her right brachiocephalic AV fistula which was placed on 10/29/2011 appears to be maturing gradually. I have ordered a follow duplex scan in 6 weeks and I will see her back at that time. If things looked good then that she can begin using her fistula for dialysis and once we know this is working her catheter can be removed. In addition she is going to be evaluated for a kidney transplant.

## 2012-01-16 ENCOUNTER — Ambulatory Visit: Payer: Self-pay

## 2012-01-28 ENCOUNTER — Ambulatory Visit
Admission: RE | Admit: 2012-01-28 | Discharge: 2012-01-28 | Disposition: A | Discharge status: Home / Self Care | Payer: Medicare Other | Source: Ambulatory Visit | Attending: Nephrology | Admitting: Nephrology

## 2012-01-28 DIAGNOSIS — Z1231 Encounter for screening mammogram for malignant neoplasm of breast: Secondary | ICD-10-CM

## 2012-02-25 ENCOUNTER — Encounter: Payer: Self-pay | Admitting: Vascular Surgery

## 2012-02-26 ENCOUNTER — Encounter: Payer: Self-pay | Admitting: Vascular Surgery

## 2012-02-26 ENCOUNTER — Encounter (INDEPENDENT_AMBULATORY_CARE_PROVIDER_SITE_OTHER): Payer: Medicare Other | Admitting: *Deleted

## 2012-02-26 ENCOUNTER — Ambulatory Visit (INDEPENDENT_AMBULATORY_CARE_PROVIDER_SITE_OTHER): Payer: Medicare Other | Admitting: Vascular Surgery

## 2012-02-26 VITALS — BP 170/90 | HR 106 | Resp 18 | Ht 62.0 in | Wt 122.0 lb

## 2012-02-26 DIAGNOSIS — N186 End stage renal disease: Secondary | ICD-10-CM

## 2012-02-26 DIAGNOSIS — T82898A Other specified complication of vascular prosthetic devices, implants and grafts, initial encounter: Secondary | ICD-10-CM

## 2012-02-26 NOTE — Progress Notes (Signed)
Vascular and Vein Specialist of [facility]  Patient name: ZISSEL CHERNIN MRN: [id number] DOB: 02/20/1971 Sex: female  REASON FOR VISIT: follow up of right brachiocephalic AV fistula  HPI: [name] is a 41 y.o. female who had a right brachiocephalic AV fistula placed on 10/29/2011. She comes in for a routine follow up visit. She dialyzes using her right IJ Diatek catheter on Monday Wednesdays and Fridays. She has had no recent uremic symptoms. She's had no pain in her arm or paresthesias.  REVIEW OF SYSTEMS: Valu.Nieves ] denotes positive finding; [  ] denotes negative finding  CARDIOVASCULAR:  [ ]  chest pain   [ ]  dyspnea on exertion    CONSTITUTIONAL:  [ ]  fever   [ ]  chills  PHYSICAL EXAM: Filed Vitals:   02/26/12 0951  BP: 170/90  Pulse: 106  Resp: 18  Height: 5\' 2"  (1.575 m)  Weight: 122 lb (55.339 kg)   Body mass index is 22.31 kg/(m^2). GENERAL: The patient is a well-nourished female, in no acute distress. The vital signs are documented above. CARDIOVASCULAR: There is a regular rate and rhythm  PULMONARY: There is good air exchange bilaterally without wheezing or rales. Her right brachiocephalic fistula has a good thrill proximally but it is not maturing adequately distally. The vein appears fairly small distally.  I have independently interpreted her duplex of her fistula which shows an area that significantly increased velocities in the proximal fistula is consistent with a significant stenosis. The majority of the fistula is not adequate in size.  MEDICAL ISSUES:  End stage renal disease Her right upper arm AV fistula does not appear to be maturing adequately. Duplex scan suggests a significant stenosis in the proximal fistula. I have recommended we proceed with a fistulogram and possible venoplasty to address this and look for other potential ways to improve the function of the fistula. Otherwise she would have to be considered for new access. She dialyzes Monday Wednesdays  and Fridays at 11:30 and requested that I do this first case on Monday so that she can get to dialysis. We have discussed the procedure potential complications including the risk of bleeding or injuring the fistula. All of her questions were answered she is agreeable to proceed.   [place] [facility] [place] Beeper: [phone]

## 2012-02-26 NOTE — Assessment & Plan Note (Signed)
Her right upper arm AV fistula does not appear to be maturing adequately. Duplex scan suggests a significant stenosis in the proximal fistula. I have recommended we proceed with a fistulogram and possible venoplasty to address this and look for other potential ways to improve the function of the fistula. Otherwise she would have to be considered for new access. She dialyzes Monday Wednesdays and Fridays at 11:30 and requested that I do this first case on Monday so that she can get to dialysis. We have discussed the procedure potential complications including the risk of bleeding or injuring the fistula. All of her questions were answered she is agreeable to proceed.

## 2012-02-27 ENCOUNTER — Other Ambulatory Visit: Payer: Self-pay

## 2012-03-12 ENCOUNTER — Encounter (HOSPITAL_COMMUNITY): Payer: Self-pay | Admitting: Pharmacy Technician

## 2012-03-15 MED ORDER — SODIUM CHLORIDE 0.9 % IJ SOLN: 3.0000 mL | INTRAMUSCULAR | Status: DC | PRN

## 2012-03-16 ENCOUNTER — Ambulatory Visit (HOSPITAL_COMMUNITY)
Admission: RE | Admit: 2012-03-16 | Discharge: 2012-03-16 | Disposition: A | Discharge status: Home / Self Care | Payer: Medicare Other | Source: Ambulatory Visit | Attending: Vascular Surgery | Admitting: Vascular Surgery

## 2012-03-16 ENCOUNTER — Encounter (HOSPITAL_COMMUNITY)
Admission: RE | Disposition: A | Discharge status: Home / Self Care | Payer: Self-pay | Source: Ambulatory Visit | Attending: Vascular Surgery

## 2012-03-16 DIAGNOSIS — Z01812 Encounter for preprocedural laboratory examination: Secondary | ICD-10-CM | POA: Insufficient documentation

## 2012-03-16 DIAGNOSIS — Z538 Procedure and treatment not carried out for other reasons: Secondary | ICD-10-CM | POA: Insufficient documentation

## 2012-03-16 LAB — POCT I-STAT, CHEM 8
BUN: 60 mg/dL — ABNORMAL HIGH (ref 6–23)
Calcium, Ion: 1.17 mmol/L (ref 1.12–1.23)
Chloride: 99 mEq/L (ref 96–112)
Creatinine, Ser: 6.7 mg/dL — ABNORMAL HIGH (ref 0.50–1.10)
Glucose, Bld: 111 mg/dL — ABNORMAL HIGH (ref 70–99)
HCT: 45 % (ref 36.0–46.0)
Hemoglobin: 15.3 g/dL — ABNORMAL HIGH (ref 12.0–15.0)
Potassium: 5 mEq/L (ref 3.5–5.1)
Sodium: 136 mEq/L (ref 135–145)
TCO2: 28 mmol/L (ref 0–100)

## 2012-03-16 LAB — PREGNANCY, URINE: Preg Test, Ur: NEGATIVE

## 2012-03-16 SURGERY — ASSESSMENT, SHUNT FUNCTION, WITH CONTRAST RADIOGRAPHIC STUDY
Anesthesia: LOCAL | Laterality: Right

## 2012-03-16 NOTE — Progress Notes (Signed)
Notified Patient that Dr Scot Dock had called and he just got called to do emergency case in the OR and would have to cancel the case.  Informed her that office will call to reschedule the procedure.  She verbalized understanding.

## 2012-03-19 ENCOUNTER — Other Ambulatory Visit: Payer: Self-pay

## 2012-03-26 ENCOUNTER — Encounter (HOSPITAL_COMMUNITY): Payer: Self-pay | Admitting: Pharmacy Technician

## 2012-04-06 ENCOUNTER — Ambulatory Visit (HOSPITAL_COMMUNITY)
Admission: RE | Admit: 2012-04-06 | Discharge: 2012-04-06 | Disposition: A | Discharge status: Home / Self Care | Payer: Medicare Other | Source: Ambulatory Visit | Attending: Vascular Surgery | Admitting: Vascular Surgery

## 2012-04-06 ENCOUNTER — Telehealth: Payer: Self-pay | Admitting: Vascular Surgery

## 2012-04-06 ENCOUNTER — Encounter (HOSPITAL_COMMUNITY)
Admission: RE | Disposition: A | Discharge status: Home / Self Care | Payer: Self-pay | Source: Ambulatory Visit | Attending: Vascular Surgery

## 2012-04-06 DIAGNOSIS — N186 End stage renal disease: Secondary | ICD-10-CM | POA: Insufficient documentation

## 2012-04-06 DIAGNOSIS — T82898A Other specified complication of vascular prosthetic devices, implants and grafts, initial encounter: Secondary | ICD-10-CM

## 2012-04-06 DIAGNOSIS — I871 Compression of vein: Secondary | ICD-10-CM | POA: Insufficient documentation

## 2012-04-06 DIAGNOSIS — Y832 Surgical operation with anastomosis, bypass or graft as the cause of abnormal reaction of the patient, or of later complication, without mention of misadventure at the time of the procedure: Secondary | ICD-10-CM | POA: Insufficient documentation

## 2012-04-06 DIAGNOSIS — Z992 Dependence on renal dialysis: Secondary | ICD-10-CM | POA: Insufficient documentation

## 2012-04-06 HISTORY — PX: SHUNTOGRAM: SHX5491

## 2012-04-06 LAB — POCT I-STAT, CHEM 8
BUN: 60 mg/dL — ABNORMAL HIGH (ref 6–23)
Calcium, Ion: 1.07 mmol/L — ABNORMAL LOW (ref 1.12–1.23)
Chloride: 106 mEq/L (ref 96–112)
Creatinine, Ser: 5.5 mg/dL — ABNORMAL HIGH (ref 0.50–1.10)
Glucose, Bld: 120 mg/dL — ABNORMAL HIGH (ref 70–99)
HCT: 38 % (ref 36.0–46.0)
Hemoglobin: 12.9 g/dL (ref 12.0–15.0)
Potassium: 5.5 mEq/L — ABNORMAL HIGH (ref 3.5–5.1)
Sodium: 138 mEq/L (ref 135–145)
TCO2: 24 mmol/L (ref 0–100)

## 2012-04-06 LAB — PREGNANCY, URINE: Preg Test, Ur: NEGATIVE

## 2012-04-06 LAB — GLUCOSE, CAPILLARY
Glucose-Capillary: 122 mg/dL — ABNORMAL HIGH (ref 70–99)
Glucose-Capillary: 128 mg/dL — ABNORMAL HIGH (ref 70–99)

## 2012-04-06 SURGERY — ASSESSMENT, SHUNT FUNCTION, WITH CONTRAST RADIOGRAPHIC STUDY
Anesthesia: LOCAL

## 2012-04-06 MED ORDER — HEPARIN SODIUM (PORCINE) 1000 UNIT/ML IJ SOLN
INTRAMUSCULAR | Status: AC
  Filled 2012-04-06: qty 1

## 2012-04-06 MED ORDER — FENTANYL CITRATE 0.05 MG/ML IJ SOLN
INTRAMUSCULAR | Status: AC
  Filled 2012-04-06: qty 2

## 2012-04-06 MED ORDER — NITROGLYCERIN 0.2 MG/ML ON CALL CATH LAB
INTRAVENOUS | Status: AC
  Filled 2012-04-06: qty 1

## 2012-04-06 MED ORDER — LIDOCAINE HCL (PF) 1 % IJ SOLN
INTRAMUSCULAR | Status: AC
  Filled 2012-04-06: qty 30

## 2012-04-06 MED ORDER — SODIUM CHLORIDE 0.9 % IJ SOLN: 3.0000 mL | INTRAMUSCULAR | Status: DC | PRN

## 2012-04-06 MED ORDER — HEPARIN (PORCINE) IN NACL 2-0.9 UNIT/ML-% IJ SOLN
INTRAMUSCULAR | Status: AC
  Filled 2012-04-06: qty 500

## 2012-04-06 MED ORDER — LABETALOL HCL 5 MG/ML IV SOLN
INTRAVENOUS | Status: AC
  Filled 2012-04-06: qty 4

## 2012-04-06 NOTE — H&P (Signed)
Patient name: [name] MRN: [id number] DOB: 08/20/1970 Sex: female   REASON FOR VISIT: For fistulogram of right brachiocephalic AV fistula  HPI:  [name] is a 41 y.o. female who had a right brachiocephalic AV fistula placed on 10/29/2011. She dialyzes using her right IJ Diatek catheter on Monday Wednesdays and Fridays. She has had no recent uremic symptoms. She's had no pain in her arm or paresthesias.   REVIEW OF SYSTEMS: Valu.Nieves ] denotes positive finding; [ ]  denotes negative finding  CARDIOVASCULAR: [ ]  chest pain [ ]  dyspnea on exertion  CONSTITUTIONAL: [ ]  fever [ ]  chills   PHYSICAL EXAM:  Filed Vitals:    02/26/12 0951   BP:  170/90   Pulse:  106   Resp:  18   Height:  5\' 2"  (1.575 m)   Weight:  122 lb (55.339 kg)    Body mass index is 22.31 kg/(m^2).  GENERAL: The patient is a well-nourished female, in no acute distress. The vital signs are documented above.  CARDIOVASCULAR: There is a regular rate and rhythm  PULMONARY: There is good air exchange bilaterally without wheezing or rales.  Her right brachiocephalic fistula has a good thrill proximally but it is not maturing adequately distally. The vein appears fairly small distally.   I have independently interpreted her duplex of her fistula which shows an area that significantly increased velocities in the proximal fistula is consistent with a significant stenosis. The majority of the fistula is not adequate in size.   MEDICAL ISSUES:   End stage renal disease  Her right upper arm AV fistula does not appear to be maturing adequately. Duplex scan suggests a significant stenosis in the proximal fistula. I have recommended we proceed with a fistulogram and possible venoplasty to address this and look for other potential ways to improve the function of the fistula. Otherwise she would have to be considered for new access. She dialyzes Monday Wednesdays and Fridays at 11:30 and requested that I do this first case on Monday  so that she can get to dialysis. We have discussed the procedure potential complications including the risk of bleeding or injuring the fistula. All of her questions were answered she is agreeable to proceed.   [place]  [facility] and Vein Specialists of [employer]

## 2012-04-06 NOTE — Op Note (Signed)
PATIENT: [name]   MRN: [id number] DOB: 1970-10-24    DATE OF PROCEDURE: 04/06/2012  INDICATIONS: EMELDA MATLICK is a 41 y.o. female who had a right brachiocephalic fistula placed on 10/29/2011. On a follow up study in September she had stenosis in the proximal fistula and is brought in for a fistulagram and possible venoplasty.  PROCEDURE:  1. Ultrasound-guided access to right brachiocephalic AV fistula 2. Fistulogram of right brachiocephalic AV fistula 3. venoplasty of proximal fistula with a 5 mm x 2 cm balloon.  SURGEON: Judeth Cornfield. Scot Dock, MD, FACS  ANESTHESIA: local with sedation   EBL: minimal  TECHNIQUE: The patient was brought to the peripheral vascular lab and the right arm was prepped and draped in the usual sterile fashion. After the skin was anesthetized with 1% lidocaine, and under ultrasound guidance, the fistula was cannulated in a retrograde fashion. The micropuncture sheath was introduced over the micropuncture wire. Fistulogram was then obtained of the fistula from the cannulation site up to the central veins. The graft was then compressed and reflux into the arterial anastomosis was performed demonstrating a stenosis in the proximal fistula or proximally 60%. I elected to address this with venoplasty. A core wire was then advanced through the stenosis into the brachial artery. I was able to advance a 4 French endhole catheter beyond the anastomosis and the [place] core wire was exchanged for a [employer] wire. The micropuncture sheath was exchanged for a 5 [place] sheath. The patient received 2000 units of IV heparin. After this had circulated for 3 minutes a 5 mm x 2 cm balloon was positioned across the stenosis and inflated to 10 atmospheres for 1 minute. I then advanced the balloon slightly into the anastomosis and inflated again for 60 seconds at 10 atmospheres. Lesion film showed improvement in the stenosis with some mild residual stenosis. However, given the proximity  to the anastomosis I did not think he would be safe to go with a 6 mm balloon and therefore stopped at this point. The wire was removed. A 4-0 Monocryl is used to suture the sheath site and the sheath was removed. The suture was tied. With good hemostasis. No immediate complications were noted.  FINDINGS: 60% stenosis of proximal fistula which was ballooned with a 5 mm balloon with improvement in the stenosis but some mild residual stenosis. No other problems with the fistula were then applied. There were no significant competing branches and no outflow obstruction.  Deitra Mayo, MD, FACS Vascular and Vein Specialists of [facility]  DATE OF DICTATION:   04/06/2012

## 2012-04-06 NOTE — Telephone Encounter (Signed)
I scheduled an appointment for this patient on 04/29/12 at 8:45am for follow up. I left message for the pt with her mother and also mailed appt letter.awt

## 2012-04-06 NOTE — Telephone Encounter (Signed)
Message copied by Lujean Amel on Mon Apr 06, 2012 10:52 AM ------      Message from: Denman George      Created: Mon Apr 06, 2012  9:52 AM      Regarding: FW: charge and f/u       Pls. sched f/u  In 2-3 wks. W/ CSD      ----- Message -----         From: Angelia Mould, MD         Sent: 04/06/2012   8:40 AM           To: Patrici Ranks, Alfonso Patten, RN, #      Subject: charge and f/u                                           This patient had ultrasound-guided access of a right brachiocephalic fistula. Fistulogram of right brachiocephalic fistula. Venoplasty of proximal fistula with 5 mm x 2 cm balloon. She needs a follow up visit in 2-3 weeks to check on the maturation of her fistula. He dialyzes on Monday Wednesdays and Fridays. Thank you. CSD

## 2012-04-28 ENCOUNTER — Encounter: Payer: Self-pay | Admitting: Vascular Surgery

## 2012-04-29 ENCOUNTER — Encounter: Payer: Self-pay | Admitting: Vascular Surgery

## 2012-04-29 ENCOUNTER — Ambulatory Visit (INDEPENDENT_AMBULATORY_CARE_PROVIDER_SITE_OTHER): Payer: Medicare Other | Admitting: Vascular Surgery

## 2012-04-29 VITALS — BP 160/83 | HR 106 | Ht 62.0 in | Wt 123.0 lb

## 2012-04-29 DIAGNOSIS — N186 End stage renal disease: Secondary | ICD-10-CM

## 2012-04-29 NOTE — Progress Notes (Signed)
Vascular and Vein Specialist of [facility]  Patient name: [name] MRN: [id number] DOB: 1971/04/17 Sex: female  REASON FOR VISIT: follow up after fistulogram with venoplasty  HPI: [name] is a 41 y.o. female who had a right brachiocephalic fistula placed on 10/29/2011. Her fistula has not yet been used for dialysis. She uses a tunneled dialysis catheter. On 04/06/2012 she underwent a fistulogram which showed stenosis in the proximal fistula which was successfully ballooned. The remainder of the fistula looked fine with no significant competing branches. She has had no problems her pain in the right arm since her procedure.   REVIEW OF SYSTEMS: Valu.Nieves ] denotes positive finding; [  ] denotes negative finding  CARDIOVASCULAR:  [ ]  chest pain   [ ]  dyspnea on exertion    CONSTITUTIONAL:  [ ]  fever   [ ]  chills  PHYSICAL EXAM: Filed Vitals:   04/29/12 0852  BP: 160/83  Pulse: 106  Height: 5\' 2"  (1.575 m)  Weight: 123 lb (55.792 kg)  SpO2: 100%   Body mass index is 22.50 kg/(m^2). GENERAL: The patient is a well-nourished female, in no acute distress. The vital signs are documented above. CARDIOVASCULAR: There is a regular rate and rhythm  PULMONARY: There is good air exchange bilaterally without wheezing or rales. The fistula has an excellent thrill. She has a palpable left radial pulse.  MEDICAL ISSUES: The fistula appears to have responded nicely to angioplasty of the proximal stenosis. I think that the fistula can be used for dialysis. If the dialysis center still had problems, the only other way to revise the fistula would be to place an interposition vein in the proximal fistula. She will let us know if they have problems cannulating her fistula and they will attempt today.  [place] [facility] of [place] Beeper: [phone]

## 2012-05-13 ENCOUNTER — Other Ambulatory Visit: Payer: Self-pay | Admitting: *Deleted

## 2012-05-19 ENCOUNTER — Ambulatory Visit (HOSPITAL_COMMUNITY)
Admission: RE | Admit: 2012-05-19 | Discharge: 2012-05-19 | Disposition: A | Discharge status: Home / Self Care | Payer: Medicare Other | Source: Ambulatory Visit | Attending: Vascular Surgery | Admitting: Vascular Surgery

## 2012-05-19 DIAGNOSIS — N186 End stage renal disease: Secondary | ICD-10-CM

## 2012-05-19 NOTE — H&P (Signed)
Agree with above.  Gae Gallop Beeper [phone] 05/19/2012

## 2012-05-19 NOTE — H&P (Signed)
VASCULAR AND VEIN SPECIALISTS SHORT STAY H&P  CC: ESRD   HPI: [name] is a 41 y.o. female who has been on HD through  functioning Hemodialysis access in the right upper extremity. They are here for HD catheter removal. Pt. denies signs of steal syndrome.  Past Medical History  Diagnosis Date  . Hypertension   . Diabetes mellitus   . Retinopathy   . Metabolic bone disease   . Anemia   . CHF (congestive heart failure)   . Pneumonia   . Blood transfusion   . Chronic kidney disease     M-W-F    FH:  Non-Contributory  Social HX History  Substance Use Topics  . Smoking status: Never Smoker   . Smokeless tobacco: Never Used  . Alcohol Use: No    Allergies No Known Allergies  Medications Current Outpatient Prescriptions  Medication Sig Dispense Refill  . amLODipine (NORVASC) 10 MG tablet Take 10 mg by mouth at bedtime.       . B Complex-C-Folic Acid (DIALYVITE Q000111Q PO) Take 1 tablet by mouth daily.       . calcium acetate (PHOSLO) 667 MG capsule Take 1,334 mg by mouth 3 (three) times daily with meals.      . cloNIDine (CATAPRES) 0.1 MG tablet Take 0.1 mg by mouth at bedtime.      Marland Kitchen lisinopril (PRINIVIL,ZESTRIL) 40 MG tablet Take 40 mg by mouth 2 (two) times daily.         Labs COAG No results found for this basename: INR, PROTIME   No results found for this basename: PTT    PHYSICAL EXAM  Filed Vitals:   05/19/12 1101  BP: 158/76  Pulse: 97  Temp: 98.3 F (36.8 C)  Resp: 18    General:  WDWN in NAD HENT: WNL Eyes: Pupils equal Pulmonary: normal non-labored breathing  Cardiac: RRR, Skin: normal, no cyanosis, jaundice, pallor or bruising Vascular Exam/Pulses: 1+ Radial pulses in RIGHT upper extremity. Extremities without ischemic changes, no Gangrene , no cellulitis; no open wounds;   There is a good thrill and good bruit in the AVF. Hand grip is 5/5 and sensation in digits is intact;   Impression: This is a 41 y.o. female who has a functioning HD  access.  Plan: Removal of Right IJ HD catheter Darcus Edds J @TODAY @ 11:46 AM

## 2012-05-19 NOTE — Progress Notes (Signed)
VASCULAR AND VEIN SPECIALISTS Catheter Removal Procedure Note  Diagnosis: ESRD with Functioning AVF/AVGG  Plan:  Remove right diatek catheter  Consent signed:  yes Time out completed:  yes Coumadin:  no PT/INR (if applicable):   Other labs:   Procedure: 1.  Sterile prepping and draping over catheter area 2. 8 ml 2% lidocaine plain instilled at removal site. 3.  right catheter removed in its entirety with cuff in tact. 4.  Complications: none  5. Tip of catheter sent for culture:  no   Patient tolerated procedure well:  yes Pressure held, no bleeding noted, dressing applied Instructions given to the pt regarding wound care and bleeding.  Other:  Richrd Prime 05/19/2012 11:45 AM

## 2012-05-19 NOTE — Progress Notes (Signed)
Dressing to right neck Clean, dry and intact.  No complaints  Patient expresses understanding of discharg instructions

## 2012-06-19 ENCOUNTER — Other Ambulatory Visit: Payer: Self-pay | Admitting: *Deleted

## 2012-06-22 ENCOUNTER — Encounter (HOSPITAL_COMMUNITY): Payer: Self-pay

## 2012-06-22 MED ORDER — DEXTROSE 5 % IV SOLN
1.5000 g | INTRAVENOUS | Status: AC
  Administered 2012-06-23: 1.5 g via INTRAVENOUS
  Filled 2012-06-22: qty 1.5

## 2012-06-23 ENCOUNTER — Ambulatory Visit (HOSPITAL_COMMUNITY): Payer: Medicare Other | Admitting: Certified Registered Nurse Anesthetist

## 2012-06-23 ENCOUNTER — Ambulatory Visit (HOSPITAL_COMMUNITY)
Admission: RE | Admit: 2012-06-23 | Discharge: 2012-06-23 | Disposition: A | Discharge status: Home / Self Care | Payer: Medicare Other | Source: Ambulatory Visit | Attending: Vascular Surgery | Admitting: Vascular Surgery

## 2012-06-23 ENCOUNTER — Telehealth: Payer: Self-pay | Admitting: Vascular Surgery

## 2012-06-23 ENCOUNTER — Encounter (HOSPITAL_COMMUNITY): Payer: Self-pay | Admitting: Certified Registered Nurse Anesthetist

## 2012-06-23 ENCOUNTER — Encounter (HOSPITAL_COMMUNITY)
Admission: RE | Disposition: A | Discharge status: Home / Self Care | Payer: Self-pay | Source: Ambulatory Visit | Attending: Vascular Surgery

## 2012-06-23 ENCOUNTER — Ambulatory Visit (HOSPITAL_COMMUNITY): Payer: Medicare Other

## 2012-06-23 DIAGNOSIS — Z992 Dependence on renal dialysis: Secondary | ICD-10-CM | POA: Insufficient documentation

## 2012-06-23 DIAGNOSIS — E119 Type 2 diabetes mellitus without complications: Secondary | ICD-10-CM | POA: Insufficient documentation

## 2012-06-23 DIAGNOSIS — T82898A Other specified complication of vascular prosthetic devices, implants and grafts, initial encounter: Secondary | ICD-10-CM

## 2012-06-23 DIAGNOSIS — I120 Hypertensive chronic kidney disease with stage 5 chronic kidney disease or end stage renal disease: Secondary | ICD-10-CM | POA: Insufficient documentation

## 2012-06-23 DIAGNOSIS — Y832 Surgical operation with anastomosis, bypass or graft as the cause of abnormal reaction of the patient, or of later complication, without mention of misadventure at the time of the procedure: Secondary | ICD-10-CM | POA: Insufficient documentation

## 2012-06-23 DIAGNOSIS — N186 End stage renal disease: Secondary | ICD-10-CM | POA: Insufficient documentation

## 2012-06-23 HISTORY — PX: INSERTION OF DIALYSIS CATHETER: SHX1324

## 2012-06-23 HISTORY — PX: PATCH ANGIOPLASTY: SHX6230

## 2012-06-23 HISTORY — PX: REVISON OF ARTERIOVENOUS FISTULA: SHX6074

## 2012-06-23 LAB — PROTIME-INR
INR: 0.89 (ref 0.00–1.49)
Prothrombin Time: 12 seconds (ref 11.6–15.2)

## 2012-06-23 LAB — GLUCOSE, CAPILLARY: Glucose-Capillary: 121 mg/dL — ABNORMAL HIGH (ref 70–99)

## 2012-06-23 LAB — SURGICAL PCR SCREEN
MRSA, PCR: NEGATIVE
Staphylococcus aureus: NEGATIVE

## 2012-06-23 LAB — POCT I-STAT 4, (NA,K, GLUC, HGB,HCT)
Glucose, Bld: 104 mg/dL — ABNORMAL HIGH (ref 70–99)
HCT: 36 % (ref 36.0–46.0)
Hemoglobin: 12.2 g/dL (ref 12.0–15.0)
Potassium: 4.4 mEq/L (ref 3.5–5.1)
Sodium: 141 mEq/L (ref 135–145)

## 2012-06-23 LAB — HCG, SERUM, QUALITATIVE: Preg, Serum: NEGATIVE

## 2012-06-23 SURGERY — REVISON OF ARTERIOVENOUS FISTULA
Anesthesia: Monitor Anesthesia Care | Site: Arm Upper | Laterality: Right | Wound class: Clean

## 2012-06-23 MED ORDER — LIDOCAINE-EPINEPHRINE (PF) 1 %-1:200000 IJ SOLN
INTRAMUSCULAR | Status: AC
  Filled 2012-06-23: qty 10

## 2012-06-23 MED ORDER — PROPOFOL 10 MG/ML IV BOLUS
INTRAVENOUS | Status: DC | PRN
  Administered 2012-06-23: 40 mg via INTRAVENOUS

## 2012-06-23 MED ORDER — MIDAZOLAM HCL 5 MG/5ML IJ SOLN
INTRAMUSCULAR | Status: DC | PRN
  Administered 2012-06-23: 2 mg via INTRAVENOUS

## 2012-06-23 MED ORDER — PHENYLEPHRINE HCL 10 MG/ML IJ SOLN
INTRAMUSCULAR | Status: DC | PRN
  Administered 2012-06-23: 80 ug via INTRAVENOUS
  Administered 2012-06-23: 40 ug via INTRAVENOUS
  Administered 2012-06-23: 120 ug via INTRAVENOUS
  Administered 2012-06-23 (×6): 80 ug via INTRAVENOUS

## 2012-06-23 MED ORDER — HEPARIN SODIUM (PORCINE) 1000 UNIT/ML IJ SOLN
INTRAMUSCULAR | Status: DC | PRN
  Administered 2012-06-23: 4000 [IU] via INTRAVENOUS

## 2012-06-23 MED ORDER — LIDOCAINE-EPINEPHRINE (PF) 1 %-1:200000 IJ SOLN
INTRAMUSCULAR | Status: DC | PRN
  Administered 2012-06-23: 22 mL

## 2012-06-23 MED ORDER — PROPOFOL INFUSION 10 MG/ML OPTIME
INTRAVENOUS | Status: DC | PRN
  Administered 2012-06-23: 100 ug/kg/min via INTRAVENOUS

## 2012-06-23 MED ORDER — OXYCODONE HCL 5 MG PO TABS: 5.0000 mg | ORAL_TABLET | ORAL | Status: DC | PRN

## 2012-06-23 MED ORDER — SODIUM CHLORIDE 0.9 % IR SOLN: Status: DC | PRN

## 2012-06-23 MED ORDER — OXYCODONE HCL 5 MG PO TABS
ORAL_TABLET | ORAL | Status: AC
  Filled 2012-06-23: qty 1

## 2012-06-23 MED ORDER — LIDOCAINE HCL (PF) 1 % IJ SOLN
INTRAMUSCULAR | Status: AC
  Filled 2012-06-23: qty 30

## 2012-06-23 MED ORDER — SODIUM CHLORIDE 0.9 % IR SOLN
Status: DC | PRN
  Administered 2012-06-23: 09:00:00

## 2012-06-23 MED ORDER — HEPARIN SODIUM (PORCINE) 1000 UNIT/ML IJ SOLN
INTRAMUSCULAR | Status: AC
  Filled 2012-06-23: qty 1

## 2012-06-23 MED ORDER — MUPIROCIN 2 % EX OINT
TOPICAL_OINTMENT | CUTANEOUS | Status: AC
  Administered 2012-06-23: 07:00:00 via NASAL
  Filled 2012-06-23: qty 22

## 2012-06-23 MED ORDER — HEPARIN SODIUM (PORCINE) 1000 UNIT/ML IJ SOLN
INTRAMUSCULAR | Status: DC | PRN
  Administered 2012-06-23: 4.6 mL via INTRAVENOUS

## 2012-06-23 MED ORDER — PROTAMINE SULFATE 10 MG/ML IV SOLN
INTRAVENOUS | Status: DC | PRN
  Administered 2012-06-23 (×2): 10 mg via INTRAVENOUS

## 2012-06-23 MED ORDER — FENTANYL CITRATE 0.05 MG/ML IJ SOLN
INTRAMUSCULAR | Status: DC | PRN
  Administered 2012-06-23 (×2): 25 ug via INTRAVENOUS
  Administered 2012-06-23: 50 ug via INTRAVENOUS

## 2012-06-23 MED ORDER — SODIUM CHLORIDE 0.9 % IV SOLN
INTRAVENOUS | Status: DC | PRN
  Administered 2012-06-23: 08:00:00 via INTRAVENOUS

## 2012-06-23 MED ORDER — 0.9 % SODIUM CHLORIDE (POUR BTL) OPTIME
TOPICAL | Status: DC | PRN
  Administered 2012-06-23: 1000 mL

## 2012-06-23 MED ORDER — SODIUM CHLORIDE 0.9 % IV SOLN: INTRAVENOUS | Status: DC

## 2012-06-23 MED ORDER — THROMBIN 20000 UNITS EX SOLR
CUTANEOUS | Status: AC
  Filled 2012-06-23: qty 20000

## 2012-06-23 SURGICAL SUPPLY — 75 items
ADH SKN CLS APL DERMABOND .7 (GAUZE/BANDAGES/DRESSINGS) ×2
BAG BANDED W/RUBBER/TAPE 36X54 (MISCELLANEOUS) ×1 IMPLANT
BAG DECANTER FOR FLEXI CONT (MISCELLANEOUS) ×3 IMPLANT
BAG EQP BAND 135X91 W/RBR TAPE (MISCELLANEOUS) ×2
CANISTER SUCTION 2500CC (MISCELLANEOUS) ×3 IMPLANT
CATH CANNON HEMO 15F 50CM (CATHETERS) IMPLANT
CATH CANNON HEMO 15FR 19 (HEMODIALYSIS SUPPLIES) IMPLANT
CATH CANNON HEMO 15FR 23CM (HEMODIALYSIS SUPPLIES) ×2 IMPLANT
CATH CANNON HEMO 15FR 31CM (HEMODIALYSIS SUPPLIES) IMPLANT
CATH CANNON HEMO 15FR 32 (HEMODIALYSIS SUPPLIES) IMPLANT
CATH CANNON HEMO 15FR 32CM (HEMODIALYSIS SUPPLIES) IMPLANT
CATH STRAIGHT 5FR 65CM (CATHETERS) ×1 IMPLANT
CHLORAPREP W/TINT 26ML (MISCELLANEOUS) ×3 IMPLANT
CLIP TI MEDIUM 6 (CLIP) ×3 IMPLANT
CLIP TI WIDE RED SMALL 6 (CLIP) ×3 IMPLANT
CLOTH BEACON ORANGE TIMEOUT ST (SAFETY) ×3 IMPLANT
COVER DOME SNAP 22 D (MISCELLANEOUS) ×1 IMPLANT
COVER PROBE W GEL 5X96 (DRAPES) ×3 IMPLANT
COVER SURGICAL LIGHT HANDLE (MISCELLANEOUS) ×3 IMPLANT
DECANTER SPIKE VIAL GLASS SM (MISCELLANEOUS) ×2 IMPLANT
DERMABOND ADVANCED (GAUZE/BANDAGES/DRESSINGS) ×1
DERMABOND ADVANCED .7 DNX12 (GAUZE/BANDAGES/DRESSINGS) ×2 IMPLANT
DEVICE TORQUE H2O (MISCELLANEOUS) ×1 IMPLANT
DRAIN PENROSE 1/2X12 LTX STRL (WOUND CARE) IMPLANT
DRAPE C-ARM 42X72 X-RAY (DRAPES) ×2 IMPLANT
DRAPE CHEST BREAST 15X10 FENES (DRAPES) ×3 IMPLANT
ELECT REM PT RETURN 9FT ADLT (ELECTROSURGICAL) ×3
ELECTRODE REM PT RTRN 9FT ADLT (ELECTROSURGICAL) ×2 IMPLANT
GAUZE SPONGE 2X2 8PLY STRL LF (GAUZE/BANDAGES/DRESSINGS) ×2 IMPLANT
GAUZE SPONGE 4X4 16PLY XRAY LF (GAUZE/BANDAGES/DRESSINGS) ×3 IMPLANT
GLOVE BIO SURGEON STRL SZ7.5 (GLOVE) ×4 IMPLANT
GLOVE BIOGEL PI IND STRL 6.5 (GLOVE) IMPLANT
GLOVE BIOGEL PI IND STRL 7.5 (GLOVE) IMPLANT
GLOVE BIOGEL PI IND STRL 8 (GLOVE) ×2 IMPLANT
GLOVE BIOGEL PI INDICATOR 6.5 (GLOVE) ×2
GLOVE BIOGEL PI INDICATOR 7.5 (GLOVE) ×5
GLOVE BIOGEL PI INDICATOR 8 (GLOVE) ×2
GLOVE ECLIPSE 6.0 STRL STRAW (GLOVE) ×1 IMPLANT
GLOVE SS BIOGEL STRL SZ 7 (GLOVE) IMPLANT
GLOVE SUPERSENSE BIOGEL SZ 7 (GLOVE) ×1
GLOVE SURG SS PI 7.5 STRL IVOR (GLOVE) ×4 IMPLANT
GOWN PREVENTION PLUS XLARGE (GOWN DISPOSABLE) ×4 IMPLANT
GOWN STRL NON-REIN LRG LVL3 (GOWN DISPOSABLE) ×8 IMPLANT
GUIDEWIRE ANGLED .035X150CM (WIRE) ×1 IMPLANT
KIT BASIN OR (CUSTOM PROCEDURE TRAY) ×3 IMPLANT
KIT ROOM TURNOVER OR (KITS) ×3 IMPLANT
MARKER SKIN DUAL TIP RULER LAB (MISCELLANEOUS) ×1 IMPLANT
NDL 18GX1X1/2 (RX/OR ONLY) (NEEDLE) ×2 IMPLANT
NDL HYPO 25GX1X1/2 BEV (NEEDLE) ×2 IMPLANT
NEEDLE 18GX1X1/2 (RX/OR ONLY) (NEEDLE) ×3 IMPLANT
NEEDLE 22X1 1/2 (OR ONLY) (NEEDLE) ×3 IMPLANT
NEEDLE HYPO 25GX1X1/2 BEV (NEEDLE) ×3 IMPLANT
NS IRRIG 1000ML POUR BTL (IV SOLUTION) ×3 IMPLANT
PACK CV ACCESS (CUSTOM PROCEDURE TRAY) ×3 IMPLANT
PACK SURGICAL SETUP 50X90 (CUSTOM PROCEDURE TRAY) ×3 IMPLANT
PAD ARMBOARD 7.5X6 YLW CONV (MISCELLANEOUS) ×6 IMPLANT
PATCH VASCULAR VASCU GUARD 1X6 (Vascular Products) ×1 IMPLANT
SPONGE GAUZE 2X2 STER 10/PKG (GAUZE/BANDAGES/DRESSINGS) ×1
SPONGE GAUZE 4X4 12PLY (GAUZE/BANDAGES/DRESSINGS) ×3 IMPLANT
SPONGE SURGIFOAM ABS GEL 100 (HEMOSTASIS) IMPLANT
SUT ETHILON 3 0 PS 1 (SUTURE) ×3 IMPLANT
SUT PROLENE 6 0 BV (SUTURE) ×7 IMPLANT
SUT VIC AB 3-0 SH 27 (SUTURE) ×6
SUT VIC AB 3-0 SH 27X BRD (SUTURE) ×2 IMPLANT
SUT VICRYL 4-0 PS2 18IN ABS (SUTURE) ×6 IMPLANT
SYR 20CC LL (SYRINGE) ×6 IMPLANT
SYR 30ML LL (SYRINGE) IMPLANT
SYR 5ML LL (SYRINGE) ×6 IMPLANT
SYR CONTROL 10ML LL (SYRINGE) ×3 IMPLANT
SYRINGE 10CC LL (SYRINGE) ×3 IMPLANT
TAPE CLOTH SURG 4X10 WHT LF (GAUZE/BANDAGES/DRESSINGS) ×1 IMPLANT
TOWEL OR 17X24 6PK STRL BLUE (TOWEL DISPOSABLE) ×3 IMPLANT
TOWEL OR 17X26 10 PK STRL BLUE (TOWEL DISPOSABLE) ×3 IMPLANT
UNDERPAD 30X30 INCONTINENT (UNDERPADS AND DIAPERS) ×3 IMPLANT
WATER STERILE IRR 1000ML POUR (IV SOLUTION) ×3 IMPLANT

## 2012-06-23 NOTE — H&P (Signed)
  REASON FOR ADMISSION: Poorly functioning right brachialcephalic AVF. HPI:  [name] is a 42 y.o. female who had a right brachiocephalic fistula placed on 10/29/2011.  On 04/06/2012 she underwent a fistulogram which showed stenosis in the proximal fistula which was successfully ballooned. The remainder of the fistula looked fine with no significant competing branches. She has had no problems her pain in the right arm since her procedure. Her catheter was removed and she has been using her AVF for HD on M-W-F at [facility]. She has had problems with the fistula and presents for revision and placement of a cuffed HD catheter.    REVIEW OF SYSTEMS: Valu.Nieves ] denotes positive finding; [ ]  denotes negative finding  CARDIOVASCULAR: [ ]  chest pain [ ]  dyspnea on exertion  CONSTITUTIONAL: [ ]  fever [ ]  chills   PHYSICAL EXAM:  Filed Vitals:    04/29/12 0852   BP:  160/83   Pulse:  106   Height:  5\' 2"  (1.575 m)   Weight:  123 lb (55.792 kg)   SpO2:  100%   Body mass index is 22.50 kg/(m^2).  GENERAL: The patient is a well-nourished female, in no acute distress. The vital signs are documented above.  CARDIOVASCULAR: There is a regular rate and rhythm  PULMONARY: There is good air exchange bilaterally without wheezing or rales.  The fistula has an excellent thrill. She has a palpable left radial pulse.   Fistulogram shows proximal stenosis which was successfully ballooned with minimal residual stenosis.   MEDICAL ISSUES:  The only remaining option to improve the performance of the fistula is to revise the proximal fistula. This can be done with bovine pericardial patch angioplasty or potentially an interposition vein graft. The procedure and risks were discussed with the patient. She is agreeable to proceed.   [place]  [facility] and Vein Specialists of [place]  Beeper: [phone]

## 2012-06-23 NOTE — Progress Notes (Signed)
Dr Scot Dock aware of CXR report.  OK to dc pt

## 2012-06-23 NOTE — Anesthesia Preprocedure Evaluation (Signed)
Anesthesia Evaluation  Patient identified by MRN, date of birth, ID band Patient awake    Reviewed: Allergy & Precautions, H&P , NPO status , Patient's Chart, lab work & pertinent test results  History of Anesthesia Complications Negative for: history of anesthetic complications  Airway Mallampati: I TM Distance: >3 FB Neck ROM: Full    Dental  (+) Dental Advisory Given   Pulmonary neg pulmonary ROS,  breath sounds clear to auscultation  Pulmonary exam normal       Cardiovascular hypertension, Pt. on medications - CHF Rhythm:Regular Rate:Normal     Neuro/Psych negative neurological ROS     GI/Hepatic negative GI ROS, Neg liver ROS,   Endo/Other  diabetes (controlled with dialysis), Well Controlled  Renal/GU ESRF and DialysisRenal disease (K+ 4.4)     Musculoskeletal   Abdominal   Peds  Hematology   Anesthesia Other Findings   Reproductive/Obstetrics                           Anesthesia Physical Anesthesia Plan  ASA: III  Anesthesia Plan: MAC   Post-op Pain Management:    Induction: Intravenous  Airway Management Planned: Simple Face Mask and Natural Airway  Additional Equipment:   Intra-op Plan:   Post-operative Plan:   Informed Consent: I have reviewed the patients History and Physical, chart, labs and discussed the procedure including the risks, benefits and alternatives for the proposed anesthesia with the patient or authorized representative who has indicated his/her understanding and acceptance.   Dental advisory given  Plan Discussed with: CRNA and Surgeon  Anesthesia Plan Comments: (Plan routine monitors, MAC)        Anesthesia Quick Evaluation

## 2012-06-23 NOTE — OR Nursing (Signed)
Insertion of dialysis catheter start at 0802; end at 0823. Revision of right upper arm fistula start at 0843.

## 2012-06-23 NOTE — Anesthesia Postprocedure Evaluation (Signed)
  Anesthesia Post-op Note  Patient: [name]  Procedure(s) Performed: Procedure(s) (LRB) with comments: REVISON OF ARTERIOVENOUS FISTULA (Right) - Ultrasound guided INSERTION OF DIALYSIS CATHETER (N/A) - Ultrasound guided PATCH ANGIOPLASTY (Right)  Patient Location: PACU  Anesthesia Type:MAC  Level of Consciousness: awake, alert , oriented and patient cooperative  Airway and Oxygen Therapy: Patient Spontanous Breathing  Post-op Pain: none  Post-op Assessment: Post-op Vital signs reviewed, Patient's Cardiovascular Status Stable, Respiratory Function Stable, Patent Airway, No signs of Nausea or vomiting and Pain level controlled  Post-op Vital Signs: Reviewed and stable  Complications: No apparent anesthesia complications

## 2012-06-23 NOTE — Progress Notes (Signed)
Renal PA Ernest Haber updated re pt here, procedures, potassium 4.4  Pt sched for HD tomorrow. No new orders.  HD access record faxed to [facility].

## 2012-06-23 NOTE — Telephone Encounter (Addendum)
Message copied by Lujean Amel on Tue Jun 23, 2012  1:17 PM ------      Message from: Alfonso Patten      Created: Tue Jun 23, 2012 11:35 AM                   ----- Message -----         From: Ulyses Amor, PA         Sent: 06/23/2012  10:22 AM           To: Alfonso Patten, RN            Revision fistula right upper ext. And diatek placement. Dr. Scot Dock f/u in 4 weeks  I scheduled an appt for the above pt on 07/22/12 at 11:15am w/ CSD. I mailed an appt letter and also left a message w/ a family member [phone]

## 2012-06-23 NOTE — Op Note (Signed)
NAME: [name]   MRN: [id number] DOB: 11-21-1970    DATE OF OPERATION: 06/23/2012  PREOP DIAGNOSIS: poorly functioning right upper arm AV fistula  POSTOP DIAGNOSIS: same  PROCEDURE:  1. Ultrasound-guided placement of left IJ 23 cm tunnel dialysis catheter 2. Revision of right upper arm AV fistula (bovine pericardial patch angioplasty of proximal AV fistula, ligation of 2 competing branches)  SURGEON: Judeth Cornfield. Scot Dock, MD, FACS  ASSIST: Gerri Lins PA  ANESTHESIA: local with sedation   EBL: minimal  INDICATIONS: [name] is a 42 y.o. female with a poorly functioning right upper arm AV fistula. She had venoplasty of a proximal stenosis with some residual stenosis. It was felt that the only remaining option would be to revise the proximal fistula surgically.  FINDINGS: The right IJ was occluded by ultrasound.  Diffuse intimal hyperplasia of the proximal fistula. There were also several areas of the fistula with significant perigraft hematoma and scarring related to previous infiltrate. This was in the area where I ligated 2 competing branches which were identified by ultrasound.  TECHNIQUE: The patient was brought to the operating room and sedated by anesthesia. Neck and upper chest were prepped and draped in usual sterile fashion. Under ultrasound guidance, and after the skin was anesthetized with 1% lidocaine, the left IJ was cannulated. By ultrasound the right IJ was occluded. The J-wire would not advance and therefore I used an angled glide wire to manipulate the wire down into the right atrium. I then exchanged this wire for the J-wire over an endhole catheter. The tract over this wire was dilated and then the dilator and peel-away sheath were passed over the wire and the dilator removed. A 23 cm catheter was advanced over the wire through the peel-away sheath down into the right atrium and the dilator and wire were removed. The exit site the catheter was selected and the  skin anesthetized between the 2 areas. Catheter was then brought to the towel clips the appropriate length and the distal ports were attached. Both ports withdrew easily with and flushed with heparinized saline and filled with concentrated heparin. Catheter was secured at its exit site with a 2-0 nylon suture. The IJ cannulation site was closed with a 4-0 subcuticular stitch. Sterile dressing was applied.  Attention was then turned to the right arm. I ultrasounded the AV fistula in the right upper arm. 2 significant competing branches were identified and marked. This was at the site of a previous infiltrate. The right upper extremity was prepped and draped in the usual sterile fashion. After the skin was infiltrated with 1% lidocaine an incision was made over the proximal fistula which was dissected free. The dissection was carried down to the brachial artery which was controlled proximally and distally. This was a small artery only approximately 3 mm in diameter. Separate incisions were made transversely over the 2 competing branches which had been identified after the skin was anesthetized. The branches were dissected free through dense scarring related to previous infiltrate. The veins and both of these areas was 6 scarred. Next attention was turned to vein patch angioplasty of the proximal fistula. The branches are not adequate size to use as a vein patch and therefore I elected to use a bovine pericardial patch. The patient was heparinized. The brachial artery was clamped proximally and distally to the anastomosis and the fistula was clamped. A longitudinal venotomy was made in the proximal fistula and the bovine pericardial patch was tailored and sewn with  continuous 6-0 Prolene suture. At completion there was a good thrill in the fistula. Heparin was partially reversed with protamine. The wounds were closed with deep layer 3-0 Vicryl the skin closed with 4-0 Vicryl. Dermabond was applied. The patient  tolerated the procedure well and was transferred to the recovery room in stable condition. All needle and sponge counts were correct.   Deitra Mayo, MD, FACS Vascular and Vein Specialists of [facility]  DATE OF DICTATION:   06/23/2012

## 2012-06-23 NOTE — Progress Notes (Signed)
Good bruit and thrill right arm. CMS good to right fingers. Hand cool , no numbness or tingling.

## 2012-06-23 NOTE — Transfer of Care (Signed)
Immediate Anesthesia Transfer of Care Note  Patient: [name]  Procedure(s) Performed: Procedure(s) (LRB) with comments: REVISON OF ARTERIOVENOUS FISTULA (Right) - Ultrasound guided INSERTION OF DIALYSIS CATHETER (N/A) - Ultrasound guided PATCH ANGIOPLASTY (Right)  Patient Location: PACU  Anesthesia Type:MAC  Level of Consciousness: awake, alert  and oriented  Airway & Oxygen Therapy: Patient Spontanous Breathing and Patient connected to nasal cannula oxygen  Post-op Assessment: Report given to PACU RN, Post -op Vital signs reviewed and stable and Patient moving all extremities  Post vital signs: Reviewed and stable  Complications: No apparent anesthesia complications

## 2012-06-24 ENCOUNTER — Encounter (HOSPITAL_COMMUNITY): Payer: Self-pay | Admitting: Vascular Surgery

## 2012-06-25 ENCOUNTER — Other Ambulatory Visit: Payer: Self-pay

## 2012-06-25 ENCOUNTER — Encounter (HOSPITAL_COMMUNITY)
Admission: RE | Disposition: A | Discharge status: Home / Self Care | Payer: Self-pay | Source: Ambulatory Visit | Attending: Vascular Surgery

## 2012-06-25 ENCOUNTER — Ambulatory Visit (HOSPITAL_COMMUNITY)
Admission: RE | Admit: 2012-06-25 | Discharge: 2012-06-25 | Disposition: A | Discharge status: Home / Self Care | Payer: Medicare Other | Source: Ambulatory Visit | Attending: Vascular Surgery | Admitting: Vascular Surgery

## 2012-06-25 ENCOUNTER — Encounter (HOSPITAL_COMMUNITY): Payer: Self-pay | Admitting: *Deleted

## 2012-06-25 ENCOUNTER — Encounter (HOSPITAL_COMMUNITY): Payer: Self-pay | Admitting: Anesthesiology

## 2012-06-25 DIAGNOSIS — T82898A Other specified complication of vascular prosthetic devices, implants and grafts, initial encounter: Secondary | ICD-10-CM | POA: Insufficient documentation

## 2012-06-25 DIAGNOSIS — I120 Hypertensive chronic kidney disease with stage 5 chronic kidney disease or end stage renal disease: Secondary | ICD-10-CM | POA: Insufficient documentation

## 2012-06-25 DIAGNOSIS — Y832 Surgical operation with anastomosis, bypass or graft as the cause of abnormal reaction of the patient, or of later complication, without mention of misadventure at the time of the procedure: Secondary | ICD-10-CM | POA: Insufficient documentation

## 2012-06-25 DIAGNOSIS — E119 Type 2 diabetes mellitus without complications: Secondary | ICD-10-CM | POA: Insufficient documentation

## 2012-06-25 DIAGNOSIS — Z992 Dependence on renal dialysis: Secondary | ICD-10-CM | POA: Insufficient documentation

## 2012-06-25 DIAGNOSIS — N186 End stage renal disease: Secondary | ICD-10-CM | POA: Insufficient documentation

## 2012-06-25 DIAGNOSIS — Z538 Procedure and treatment not carried out for other reasons: Secondary | ICD-10-CM | POA: Insufficient documentation

## 2012-06-25 LAB — POCT I-STAT, CHEM 8
BUN: 15 mg/dL (ref 6–23)
Calcium, Ion: 1.14 mmol/L (ref 1.12–1.23)
Chloride: 100 mEq/L (ref 96–112)
Creatinine, Ser: 3.2 mg/dL — ABNORMAL HIGH (ref 0.50–1.10)
Glucose, Bld: 113 mg/dL — ABNORMAL HIGH (ref 70–99)
HCT: 29 % — ABNORMAL LOW (ref 36.0–46.0)
Hemoglobin: 9.9 g/dL — ABNORMAL LOW (ref 12.0–15.0)
Potassium: 3.9 mEq/L (ref 3.5–5.1)
Sodium: 140 mEq/L (ref 135–145)
TCO2: 30 mmol/L (ref 0–100)

## 2012-06-25 LAB — GLUCOSE, CAPILLARY: Glucose-Capillary: 91 mg/dL (ref 70–99)

## 2012-06-25 SURGERY — CANCELLED PROCEDURE

## 2012-06-25 MED ORDER — SODIUM CHLORIDE 0.9 % IR SOLN
Status: DC | PRN
  Administered 2012-06-25: 17:00:00

## 2012-06-25 MED ORDER — ALTEPLASE 100 MG IV SOLR
2.0000 mg | Freq: Once | INTRAVENOUS | Status: DC
  Filled 2012-06-25: qty 2

## 2012-06-25 MED ORDER — HEPARIN SODIUM (PORCINE) 1000 UNIT/ML IJ SOLN
INTRAMUSCULAR | Status: AC
  Filled 2012-06-25: qty 1

## 2012-06-25 MED ORDER — LACTATED RINGERS IV SOLN: INTRAVENOUS | Status: DC

## 2012-06-25 MED ORDER — LIDOCAINE-EPINEPHRINE (PF) 1 %-1:200000 IJ SOLN
INTRAMUSCULAR | Status: AC
  Filled 2012-06-25: qty 10

## 2012-06-25 MED ORDER — SODIUM CHLORIDE 0.9 % IR SOLN
Status: DC | PRN
  Administered 2012-06-25: 1000 mL

## 2012-06-25 MED ORDER — CEFAZOLIN SODIUM 1-5 GM-% IV SOLN
INTRAVENOUS | Status: AC
  Filled 2012-06-25: qty 50

## 2012-06-25 MED ORDER — CEFAZOLIN SODIUM-DEXTROSE 2-3 GM-% IV SOLR: 2.0000 g | Freq: Once | INTRAVENOUS | Status: DC

## 2012-06-25 MED ORDER — SODIUM CHLORIDE 0.9 % IV SOLN
INTRAVENOUS | Status: DC
  Administered 2012-06-25: 20 mL/h via INTRAVENOUS

## 2012-06-25 SURGICAL SUPPLY — 40 items
BAG DECANTER FOR FLEXI CONT (MISCELLANEOUS) ×3 IMPLANT
CATH CANNON HEMO 15F 50CM (CATHETERS) IMPLANT
CATH CANNON HEMO 15FR 19 (HEMODIALYSIS SUPPLIES) IMPLANT
CATH CANNON HEMO 15FR 23CM (HEMODIALYSIS SUPPLIES) IMPLANT
CATH CANNON HEMO 15FR 31CM (HEMODIALYSIS SUPPLIES) IMPLANT
CATH CANNON HEMO 15FR 32 (HEMODIALYSIS SUPPLIES) IMPLANT
CATH CANNON HEMO 15FR 32CM (HEMODIALYSIS SUPPLIES) IMPLANT
CLOTH BEACON ORANGE TIMEOUT ST (SAFETY) ×3 IMPLANT
COVER PROBE W GEL 5X96 (DRAPES) ×3 IMPLANT
COVER SURGICAL LIGHT HANDLE (MISCELLANEOUS) ×3 IMPLANT
DRAPE C-ARM 42X72 X-RAY (DRAPES) ×3 IMPLANT
DRAPE CHEST BREAST 15X10 FENES (DRAPES) ×3 IMPLANT
GAUZE SPONGE 2X2 8PLY STRL LF (GAUZE/BANDAGES/DRESSINGS) ×2 IMPLANT
GAUZE SPONGE 4X4 16PLY XRAY LF (GAUZE/BANDAGES/DRESSINGS) ×3 IMPLANT
GLOVE BIO SURGEON STRL SZ7 (GLOVE) ×3 IMPLANT
GLOVE BIOGEL PI IND STRL 7.5 (GLOVE) ×2 IMPLANT
GLOVE BIOGEL PI INDICATOR 7.5 (GLOVE) ×1
GOWN STRL NON-REIN LRG LVL3 (GOWN DISPOSABLE) ×6 IMPLANT
KIT BASIN OR (CUSTOM PROCEDURE TRAY) ×3 IMPLANT
KIT ROOM TURNOVER OR (KITS) ×3 IMPLANT
NDL 18GX1X1/2 (RX/OR ONLY) (NEEDLE) ×1 IMPLANT
NDL HYPO 25GX1X1/2 BEV (NEEDLE) ×1 IMPLANT
NEEDLE 18GX1X1/2 (RX/OR ONLY) (NEEDLE) ×2 IMPLANT
NEEDLE HYPO 25GX1X1/2 BEV (NEEDLE) ×2 IMPLANT
NS IRRIG 1000ML POUR BTL (IV SOLUTION) ×3 IMPLANT
PACK SURGICAL SETUP 50X90 (CUSTOM PROCEDURE TRAY) ×3 IMPLANT
PAD ARMBOARD 7.5X6 YLW CONV (MISCELLANEOUS) ×6 IMPLANT
SOAP 2 % CHG 4 OZ (WOUND CARE) ×3 IMPLANT
SPONGE GAUZE 2X2 STER 10/PKG (GAUZE/BANDAGES/DRESSINGS) ×1
SUT ETHILON 3 0 PS 1 (SUTURE) ×3 IMPLANT
SUT MNCRL AB 4-0 PS2 18 (SUTURE) ×3 IMPLANT
SYR 20CC LL (SYRINGE) ×6 IMPLANT
SYR 30ML LL (SYRINGE) IMPLANT
SYR 3ML LL SCALE MARK (SYRINGE) ×3 IMPLANT
SYR 5ML LL (SYRINGE) ×3 IMPLANT
SYR CONTROL 10ML LL (SYRINGE) ×3 IMPLANT
SYRINGE 10CC LL (SYRINGE) ×3 IMPLANT
TOWEL OR 17X24 6PK STRL BLUE (TOWEL DISPOSABLE) ×3 IMPLANT
TOWEL OR 17X26 10 PK STRL BLUE (TOWEL DISPOSABLE) ×3 IMPLANT
WATER STERILE IRR 1000ML POUR (IV SOLUTION) ×3 IMPLANT

## 2012-06-25 NOTE — Anesthesia Preprocedure Evaluation (Deleted)
Anesthesia Evaluation  Patient identified by MRN, date of birth, ID band Patient awake    Reviewed: Allergy & Precautions, H&P , NPO status , Patient's Chart, lab work & pertinent test results  Airway Mallampati: II      Dental  (+) Dental Advisory Given and Partial Lower   Pulmonary  breath sounds clear to auscultation        Cardiovascular Rhythm:Regular Rate:Normal     Neuro/Psych    GI/Hepatic   Endo/Other    Renal/GU      Musculoskeletal   Abdominal   Peds  Hematology   Anesthesia Other Findings   Reproductive/Obstetrics                           Anesthesia Physical Anesthesia Plan  ASA: III  Anesthesia Plan: MAC   Post-op Pain Management:    Induction: Intravenous  Airway Management Planned: Natural Airway  Additional Equipment:   Intra-op Plan:   Post-operative Plan:   Informed Consent: I have reviewed the patients History and Physical, chart, labs and discussed the procedure including the risks, benefits and alternatives for the proposed anesthesia with the patient or authorized representative who has indicated his/her understanding and acceptance.     Plan Discussed with: CRNA and Surgeon  Anesthesia Plan Comments: (ESRD last HD 1/6 K-3.9 Type 2 DM glucose 113 htn H/O PONV  Plan MAC)        Anesthesia Quick Evaluation

## 2012-06-25 NOTE — Interval H&P Note (Signed)
[facility] [place]  History and Physical Update  The patient was interviewed and re-examined.  The patient's previous History and Physical has been reviewed and is unchanged from Dr. Nicole Cella note except for: interval revision of right brachiocephalic arteriovenous fistula and placement of left internal jugular vein tunneled dialysis catheter.  I discussed with the patient that placement of alteplase in the tunneled dialysis catheter ports prior to attempting the exchange of tunneled dialysis catheter was indicated.   Adele Barthel, MD [facility] [place] Office: [phone] Pager: [phone]  06/25/2012, 4:36 PM

## 2012-06-25 NOTE — H&P (View-Only) (Signed)
  REASON FOR ADMISSION: Poorly functioning right brachialcephalic AVF. HPI:  [name] is a 42 y.o. female who had a right brachiocephalic fistula placed on 10/29/2011.  On 04/06/2012 she underwent a fistulogram which showed stenosis in the proximal fistula which was successfully ballooned. The remainder of the fistula looked fine with no significant competing branches. She has had no problems her pain in the right arm since her procedure. Her catheter was removed and she has been using her AVF for HD on M-W-F at [facility]. She has had problems with the fistula and presents for revision and placement of a cuffed HD catheter.    REVIEW OF SYSTEMS: Valu.Nieves ] denotes positive finding; [ ]  denotes negative finding  CARDIOVASCULAR: [ ]  chest pain [ ]  dyspnea on exertion  CONSTITUTIONAL: [ ]  fever [ ]  chills   PHYSICAL EXAM:  Filed Vitals:    04/29/12 0852   BP:  160/83   Pulse:  106   Height:  5\' 2"  (1.575 m)   Weight:  123 lb (55.792 kg)   SpO2:  100%   Body mass index is 22.50 kg/(m^2).  GENERAL: The patient is a well-nourished female, in no acute distress. The vital signs are documented above.  CARDIOVASCULAR: There is a regular rate and rhythm  PULMONARY: There is good air exchange bilaterally without wheezing or rales.  The fistula has an excellent thrill. She has a palpable left radial pulse.   Fistulogram shows proximal stenosis which was successfully ballooned with minimal residual stenosis.   MEDICAL ISSUES:  The only remaining option to improve the performance of the fistula is to revise the proximal fistula. This can be done with bovine pericardial patch angioplasty or potentially an interposition vein graft. The procedure and risks were discussed with the patient. She is agreeable to proceed.   [place]  [facility] [place]  Beeper: [phone]

## 2012-06-29 ENCOUNTER — Telehealth: Payer: Self-pay

## 2012-06-29 NOTE — Telephone Encounter (Signed)
Pt. Called to report onset of numbness and tingling of index and middle finger of right hand, since 06/24/12.  States that the tingling comes and goes, but the fingers stay numb. Also c/o right forearm and hand feel lukewarm to cool compared to the upper arm is warm.  States unable to turn the key in the ignition and unable to grip a coffee mug with her right hand.

## 2012-06-29 NOTE — Telephone Encounter (Signed)
Discussed pt's symptoms with Dr. Trula Slade.  Advised to have pt. Work right hand with an exercise ball, and to be eval. In office on Wednesday.  Pt. Instructed on Dr. Stephens Shire recommendation.  Verb. Understanding.

## 2012-06-30 ENCOUNTER — Encounter: Payer: Self-pay | Admitting: Neurosurgery

## 2012-07-01 ENCOUNTER — Ambulatory Visit (INDEPENDENT_AMBULATORY_CARE_PROVIDER_SITE_OTHER): Payer: Medicare Other | Admitting: Vascular Surgery

## 2012-07-01 ENCOUNTER — Encounter: Payer: Self-pay | Admitting: Neurosurgery

## 2012-07-01 ENCOUNTER — Ambulatory Visit: Payer: Medicare Other | Admitting: Vascular Surgery

## 2012-07-01 VITALS — BP 109/69 | HR 77 | Resp 16 | Ht 62.0 in | Wt 125.1 lb

## 2012-07-01 DIAGNOSIS — R202 Paresthesia of skin: Secondary | ICD-10-CM | POA: Insufficient documentation

## 2012-07-01 DIAGNOSIS — N186 End stage renal disease: Secondary | ICD-10-CM

## 2012-07-01 DIAGNOSIS — R200 Anesthesia of skin: Secondary | ICD-10-CM | POA: Insufficient documentation

## 2012-07-01 DIAGNOSIS — M79609 Pain in unspecified limb: Secondary | ICD-10-CM

## 2012-07-01 DIAGNOSIS — R209 Unspecified disturbances of skin sensation: Secondary | ICD-10-CM

## 2012-07-01 NOTE — Progress Notes (Signed)
Vascular and Vein Specialist of [facility]  Patient name: [name] MRN: [id number] DOB: [birth year] Sex: female  REASON FOR VISIT: follow up of right upper arm AV fistula  HPI: [name] is a 41 y.o. female who had developed a steal in her left arm after an AV graft and had to have this removed because of steal syndrome. On 10/29/2011 she had a right brachiocephalic AV fistula placed. At 3-1/2 mm vein and a 3 mm artery. On 04/06/2012 she underwent a fistulogram with venoplasty of a proximal stenosis in the fistula. The fistula continued to function poorly and therefore she underwent revision of the fistula on 06/23/2012. I performed a bovine pericardial patch angioplasty of the proximal fistula and ligated 2 competing branches. After this procedure she began having some paresthesias in the right hand. She states that these symptoms, and go. Her symptoms seem to be relieved by holding the hand down or exercising the hand.   REVIEW OF SYSTEMS: Valu.Nieves ] denotes positive finding; [  ] denotes negative finding  CARDIOVASCULAR:  [ ]  chest pain   [ ]  dyspnea on exertion    CONSTITUTIONAL:  [ ]  fever   [ ]  chills  PHYSICAL EXAM: Filed Vitals:   07/01/12 0934  BP: 109/69  Pulse: 77  Resp: 16  Height: 5\' 2"  (1.575 m)  Weight: 125 lb 1.6 oz (56.745 kg)  SpO2: 100%   Body mass index is 22.88 kg/(m^2). GENERAL: The patient is a well-nourished female, in no acute distress. The vital signs are documented above. CARDIOVASCULAR: There is a regular rate and rhythm  PULMONARY: There is good air exchange bilaterally without wheezing or rales. The fistula has an excellent thrill. She has a radial, ulnar, and palmar arch signal with the Doppler to  MEDICAL ISSUES: This patient has mild steal symptoms in the right upper extremity. She has very small arteries. I've explained that there are 4 options. One week and continue to follow her symptoms with the hopes that this will continue to improve as the  artery gradually enlarges in size. #2 we could ligate the fistula which would restore her perfusion back to normal but obviously sacrificed the fistula. The third option would be to abandon the fistula however this is oftentimes unsuccessful in that if the fistula is not narrowed enough the patient continues to have symptoms and if the fistula is narrowed too much the fistula occludes. A fourth option would be a DRIL procedure which would involve an upper extremity arteriogram and then harvesting vein from the lower extremities in order to bypass around the anastomosis. This is oftentimes successful but does involve some risk. She feels comfortable continue to follow her symptoms are now I'll see her back in one week. Certainly if her symptoms are not improving we could consider proceeding with right upper extremity arteriogram and greater saphenous vein mapping in order to evaluate her for a DRIL procedure.  [place] [facility] [place] Beeper: [phone]

## 2012-07-06 ENCOUNTER — Telehealth: Payer: Self-pay

## 2012-07-06 NOTE — Telephone Encounter (Signed)
Phone call from pt.  Reported onset of swelling left side of face on 07/02/12.  States that the swelling has progressed to bilateral face and neck.  States that yesterday, she had a lot of nasal congestion and difficulty breathing through the nose.  States today, her nasal congestion is better.   Denies hives.  Denies change in medication, but stated she took one dose of Oxycontin yesterday.  Denied any change in the symptoms following the Oxycontin.  States she is on dialysis treatment at this time.  Discussed pt's. Symptoms with Dr. Trula Slade.  Stated that if pt. Is not having any difficulty breathing or having worsening symptoms with steal of right hand, then okay to wait until office appt. Scheduled w/ Dr.Dickson on 07/08/12.  Phone call to pt.  Clarified her symptoms with right hand.  Stated she has some tingling that comes and goes in the tips of her 4 fingers/ right hand.  Verb. hving difficulty tying her shoes and buttoning jeans/ blouses, but states this is not any change from when she saw Dr. Scot Dock 07/01/12.  Advised to call back to office, or go to the ER if her symptoms worsen.  Verb. Understaning.  Knows to keep her appt. With [facility] on 07/08/12.

## 2012-07-07 ENCOUNTER — Encounter: Payer: Self-pay | Admitting: Vascular Surgery

## 2012-07-08 ENCOUNTER — Encounter: Payer: Self-pay | Admitting: Vascular Surgery

## 2012-07-08 ENCOUNTER — Other Ambulatory Visit (HOSPITAL_COMMUNITY): Payer: Self-pay | Admitting: Nephrology

## 2012-07-08 ENCOUNTER — Other Ambulatory Visit: Payer: Self-pay | Admitting: *Deleted

## 2012-07-08 ENCOUNTER — Ambulatory Visit (INDEPENDENT_AMBULATORY_CARE_PROVIDER_SITE_OTHER): Payer: Medicare Other | Admitting: Vascular Surgery

## 2012-07-08 VITALS — BP 136/70 | HR 100 | Temp 98.1°F | Resp 16 | Ht 62.0 in | Wt 122.8 lb

## 2012-07-08 DIAGNOSIS — N186 End stage renal disease: Secondary | ICD-10-CM

## 2012-07-08 DIAGNOSIS — R202 Paresthesia of skin: Secondary | ICD-10-CM

## 2012-07-08 DIAGNOSIS — Z4931 Encounter for adequacy testing for hemodialysis: Secondary | ICD-10-CM

## 2012-07-08 DIAGNOSIS — R209 Unspecified disturbances of skin sensation: Secondary | ICD-10-CM

## 2012-07-08 DIAGNOSIS — R221 Localized swelling, mass and lump, neck: Secondary | ICD-10-CM

## 2012-07-08 DIAGNOSIS — R220 Localized swelling, mass and lump, head: Secondary | ICD-10-CM

## 2012-07-08 NOTE — Progress Notes (Signed)
Vascular and Vein Specialist of [facility]  Patient name: [name] MRN: [id number] DOB: [birth year] Sex: female  REASON FOR VISIT: follow up of a right brachiocephalic AV fistula  HPI: [name] is a 42 y.o. female who had a right brachiocephalic AV fistula placed in may of 2013. She had previously had a left arm graft remove because of steal syndrome. At the time of surgery on her right arm it was noted that her artery was 3 mm in diameter and was fairly small. On a follow up duplex scan in September of 2013 it was noted that she had increased velocities in the proximal fistula and the fistula was not large enough at that point. On 1021 13th she had a fistulogram of her right brachiocephalic AV fistula in venoplasty of a proximal stenosis with a 5 mm balloon with a good result but some mild residual stenosis. On a follow up visit after this it appeared that the fistula was working well we allowed the dialysis center to cannulate the fistula. It was initially working well and her Diatek catheter on the right was then removed. However, the patient then began having problems with her fistula and on 06/23/2012 she had a new left IJ tunneled dialysis catheter placed and her fistula was revised. She had a bovine pericardial patch angioplasty of the proximal fistula and ligation of 2 competing branches. At the time of this procedure it was noted that her right IJ was occluded by ultrasound. He was also found that she had diffuse intimal hyperplasia the proximal fistula which was addressed with the bovine pericardial patch angioplasty. Comes in for a follow up visit.  She states that she had an episode of facial swelling at the last dialysis session and Dr. Marval Regal has arranged for her to have a central venogram to rule out central venous stenosis. He states that the facial swelling has essentially resolved. She has no significant pain in the right hand and mild paresthesias which have improved. Years  continuing to use her left IJ Diatek catheter.  REVIEW OF SYSTEMS: Valu.Nieves ] denotes positive finding; [  ] denotes negative finding  CARDIOVASCULAR:  [ ]  chest pain   [ ]  dyspnea on exertion    CONSTITUTIONAL:  [ ]  fever   [ ]  chills  PHYSICAL EXAM: Filed Vitals:   07/08/12 1346  BP: 136/70  Pulse: 100  Temp: 98.1 F (36.7 C)  TempSrc: Oral  Resp: 16  Height: 5\' 2"  (1.575 m)  Weight: 122 lb 12.7 oz (55.7 kg)  SpO2: 100%   Body mass index is 22.46 kg/(m^2). GENERAL: The patient is a well-nourished female, in no acute distress. The vital signs are documented above. CARDIOVASCULAR: There is a regular rate and rhythm  PULMONARY: There is good air exchange bilaterally without wheezing or rales. The fistula has a good thrill proximally with a somewhat weaker thrill distally. She has a radial and palm are arch signal with the Doppler on the right which augments with compression of her fistula.  MEDICAL ISSUES: At this point I do not think the fistula is quite ready to be cannulated. She's scheduled him back on February 5 for a duplex of her fistula. If at that point the fistula is ready to be used then we can begin using the fistula and if it works well at arrangements can be made to remove her catheter. She is also scheduled for central venogram next week to look for central venous stenosis.this is clearly a complex  situation in that she's are had steal problems in her upper arm has some mild steel on the right. His head multiple problems with her fistula and now could potentially have an issue with his central venous stenosis. We'll await results of her venogram and also her follow up duplex scan of her fistula.   [place] [facility] [place] Beeper: [phone]

## 2012-07-10 ENCOUNTER — Other Ambulatory Visit (HOSPITAL_COMMUNITY): Payer: Self-pay | Admitting: Physician Assistant

## 2012-07-14 ENCOUNTER — Encounter (HOSPITAL_COMMUNITY): Payer: Self-pay

## 2012-07-14 ENCOUNTER — Ambulatory Visit (HOSPITAL_COMMUNITY)
Admission: RE | Admit: 2012-07-14 | Discharge: 2012-07-14 | Disposition: A | Discharge status: Home / Self Care | Payer: Medicare Other | Source: Ambulatory Visit | Attending: Nephrology | Admitting: Nephrology

## 2012-07-14 ENCOUNTER — Other Ambulatory Visit (HOSPITAL_COMMUNITY): Payer: Self-pay | Admitting: Nephrology

## 2012-07-14 DIAGNOSIS — N186 End stage renal disease: Secondary | ICD-10-CM

## 2012-07-14 DIAGNOSIS — I871 Compression of vein: Secondary | ICD-10-CM | POA: Insufficient documentation

## 2012-07-14 DIAGNOSIS — Z992 Dependence on renal dialysis: Secondary | ICD-10-CM | POA: Insufficient documentation

## 2012-07-14 DIAGNOSIS — D638 Anemia in other chronic diseases classified elsewhere: Secondary | ICD-10-CM | POA: Insufficient documentation

## 2012-07-14 DIAGNOSIS — E119 Type 2 diabetes mellitus without complications: Secondary | ICD-10-CM | POA: Insufficient documentation

## 2012-07-14 DIAGNOSIS — I120 Hypertensive chronic kidney disease with stage 5 chronic kidney disease or end stage renal disease: Secondary | ICD-10-CM | POA: Insufficient documentation

## 2012-07-14 MED ORDER — HEPARIN SODIUM (PORCINE) 1000 UNIT/ML IJ SOLN
INTRAMUSCULAR | Status: AC
  Filled 2012-07-14: qty 1

## 2012-07-14 NOTE — Procedures (Signed)
SVC gram Exchange of L IJ HD catheter No complication No blood loss. See complete dictation in [facility].

## 2012-07-14 NOTE — H&P (Signed)
HPI: [name] is an 42 y.o. female with recently placed (L)permacath by VVS for HD access while her RUE AVF revision heals. She has been having some intermittent swelling of her neck and face. She  Is referred for venogram to assess and possibly treat. PMHx and meds reviewed  Past Medical History:  Past Medical History  Diagnosis Date  . Retinopathy   . Metabolic bone disease   . Anemia   . CHF (congestive heart failure)   . Pneumonia   . Blood transfusion   . Chronic kidney disease     M-W-F  . Hypertension     sees Dr. Carolin Guernsey  . Diabetes mellitus     "controlled with diet"    Past Surgical History:  Past Surgical History  Procedure Date  . Insertion of dialysis catheter 10/04/2011    Procedure: INSERTION OF DIALYSIS CATHETER;  Surgeon: Angelia Mould, MD;  Location: [place];  Service: Vascular;  Laterality: Right;  insertion of dialysis catheter right internal jugular  . Av fistula placement 10/04/2011    Procedure: INSERTION OF ARTERIOVENOUS (AV) GORE-TEX GRAFT ARM;  Surgeon: Angelia Mould, MD;  Location: [place];  Service: Vascular;  Laterality: Left;  Insertion left upper arm Arteriovenous goretex graft  . Avgg removal 10/04/2011    Procedure: REMOVAL OF ARTERIOVENOUS GORETEX GRAFT ([place]);  Surgeon: Elam Dutch, MD;  Location: [place];  Service: Vascular;  Laterality: Left;  . Av fistula placement 10/29/2011    Procedure: ARTERIOVENOUS (AV) FISTULA CREATION;  Surgeon: Angelia Mould, MD;  Location: [facility] OR;  Service: Vascular;  Laterality: Right;  Creation Right Arteriovenous Fistula   . Revison of arteriovenous fistula 06/23/2012    Procedure: REVISON OF ARTERIOVENOUS FISTULA;  Surgeon: Angelia Mould, MD;  Location: [place];  Service: Vascular;  Laterality: Right;  Ultrasound guided  . Insertion of dialysis catheter 06/23/2012    Procedure: INSERTION OF DIALYSIS CATHETER;  Surgeon: Angelia Mould, MD;  Location: [place];  Service:  Vascular;  Laterality: N/A;  Ultrasound guided  . Patch angioplasty 06/23/2012    Procedure: PATCH ANGIOPLASTY;  Surgeon: Angelia Mould, MD;  Location: [facility] OR;  Service: Vascular;  Laterality: Right;    Family History:  Family History  Problem Relation Age of Onset  . Malignant hyperthermia Mother   . Malignant hyperthermia Father   . Anesthesia problems Neg Hx     Social History:  reports that she has never smoked. She has never used smokeless tobacco. She reports that she does not drink alcohol or use illicit drugs.  Allergies: No Known Allergies  Medications: amLODipine (NORVASC) 10 MG tablet 11/14/2011 Sig - Route: Take 10 mg by mouth at bedtime. - Oral Class: Historical Med Number of times this order has been changed since signing: 3 Order [address] B Complex-C-Folic Acid (DIALYVITE [id number] PO) Sig - Route: Take 1 tablet by mouth daily. - Oral Class: Historical Med Number of times this order has been changed since signing: 3 Order [address] calcium acetate (PHOSLO) 667 MG capsule Sig - Route: Take 1,334 mg by mouth 3 (three) times daily with meals. - Oral Class: Historical Med Number of times this order has been changed since signing: 1 Order [address] calcium acetate (PHOSLO) 667 MG capsule 06/07/2012 Class: Historical Med cloNIDine (CATAPRES) 0.1 MG tablet Sig - Route: Take 0.2 mg by mouth at bedtime. - Oral Class: Historical Med Number of times this order has been changed since signing: 3 Order  [address] lisinopril (PRINIVIL,ZESTRIL) 40 MG tablet Sig - Route: Take 40 mg by mouth 2 (two) times daily. - Oral Class: Historical Med Number of times this order has been changed since signing: 5 Order [address] oxyCODONE (ROXICODONE) 5 MG immediate release tablet 30 tablet 0 06/23/2012 Sig - Route: Take 1 tablet (5 mg total) by mouth every 4 (four) hours as needed for pain   Please HPI for pertinent positives, otherwise complete 10 system ROS negative.  Physical Exam: Blood pressure  149/73, pulse 91, temperature 98.4 F (36.9 C), temperature source Oral, resp. rate 16, last menstrual period 06/08/2012, SpO2 99.00%. There is no height or weight on file to calculate BMI.   General Appearance:  Alert, cooperative, no distress, appears stated age  Head:  Normocephalic, without obvious abnormality, atraumatic  ENT: Unremarkable  Neck: Supple, symmetrical, trachea midline, mildly distended ext jugular veins bilat. Minimal appreciable edema of neck and face. Hard to actually see any asymmetry.  Lungs:   Clear to auscultation bilaterally, no w/r/r, respirations unlabored without use of accessory muscles.  Chest Wall:  No tenderness or deformity  Heart:  Regular rate and rhythm, S1, S2 normal, no murmur, rub or gallop. Carotids 2+ without bruit.  Extremities: Extremities normal, atraumatic, no cyanosis or edema  Pulses: 2+ and symmetric   No results found for this or any previous visit (from the past 48 hour(s)). No results found.  Assessment/Plan (L)neck and face swelling, possible due to central venous stenosis or from recent permacath placement. For central venogram today, possible angioplasty, possible PC exchange. Discussed procedure, risks, complications. She has been NPO and has a ride and would be acceptable risk for sedation. Consent signed in chart  Ascencion Dike PA-C 07/14/2012, 8:22 AM

## 2012-07-15 ENCOUNTER — Telehealth (HOSPITAL_COMMUNITY): Payer: Self-pay | Admitting: *Deleted

## 2012-07-21 ENCOUNTER — Encounter: Payer: Self-pay | Admitting: Vascular Surgery

## 2012-07-22 ENCOUNTER — Other Ambulatory Visit: Payer: Self-pay | Admitting: Internal Medicine

## 2012-07-22 ENCOUNTER — Ambulatory Visit (INDEPENDENT_AMBULATORY_CARE_PROVIDER_SITE_OTHER): Payer: 59 | Admitting: Vascular Surgery

## 2012-07-22 ENCOUNTER — Encounter (INDEPENDENT_AMBULATORY_CARE_PROVIDER_SITE_OTHER): Payer: Medicare Other | Admitting: *Deleted

## 2012-07-22 ENCOUNTER — Encounter: Payer: Self-pay | Admitting: Vascular Surgery

## 2012-07-22 VITALS — BP 184/80 | HR 102 | Ht 62.0 in | Wt 118.8 lb

## 2012-07-22 DIAGNOSIS — N186 End stage renal disease: Secondary | ICD-10-CM

## 2012-07-22 DIAGNOSIS — Z1231 Encounter for screening mammogram for malignant neoplasm of breast: Secondary | ICD-10-CM

## 2012-07-22 DIAGNOSIS — Z4931 Encounter for adequacy testing for hemodialysis: Secondary | ICD-10-CM

## 2012-07-22 NOTE — Progress Notes (Signed)
Vascular and Vein Specialist of [facility]  Patient name: [name] MRN: [id number] DOB: Oct 24, 1970 Sex: female  REASON FOR VISIT: follow up after revision of her right brachiocephalic AV fistula.  HPI: DENNISSE [name] is a 42 y.o. female who had undergone removal of a left arm graft because of steal syndrome. On 10/29/2011 she had a right brachiocephalic AV fistula placed. She had undergone previous venoplasty of a proximal stenosis within her fistula but the fistula continued to function poorly. It was felt that the only remaining option to try to salvage the fistula with surgical revision. On 06/23/2012 she underwent discernible left IJ tunneled dialysis catheter and revision of her right upper arm AV fistula. She had bovine pericardial patch angioplasty of the proximal fistula and ligation of 2 competing branches. He comes in for a follow up visit. She's had no significant pain or paresthesias in the right upper extremity   REVIEW OF SYSTEMS: Valu.Nieves ] denotes positive finding; [  ] denotes negative finding  CARDIOVASCULAR:  [ ]  chest pain   [ ]  dyspnea on exertion    CONSTITUTIONAL:  [ ]  fever   [ ]  chills  PHYSICAL EXAM: Filed Vitals:   07/22/12 1121  BP: 184/80  Pulse: 102  Height: 5\' 2"  (1.575 m)  Weight: 118 lb 13.3 oz (53.9 kg)  SpO2: 100%   Body mass index is 21.73 kg/(m^2). GENERAL: The patient is a well-nourished female, in no acute distress. The vital signs are documented above. CARDIOVASCULAR: There is a regular rate and rhythm  PULMONARY: There is good air exchange bilaterally without wheezing or rales. The fistula has an excellent thrill. The right hand is warm and well-perfused.  Duplex scan shows that the diameters of her right brachiocephalic fistula range from 0.7-1.0 cm. Depths ranged from 0.33 to  0.64 cm.  MEDICAL ISSUES:  End stage renal disease The fistula appears to be functioning better after her recent revision. She has an excellent thrill in the vein  appears to be a reasonable in size. I think it would be reasonable to attempt cannulation of the fistula in approximately 2 weeks. Once we know that the fistula is working we can arrange to have her catheter removed.     [place] [facility] [place] Beeper: [phone]

## 2012-07-22 NOTE — Assessment & Plan Note (Signed)
The fistula appears to be functioning better after her recent revision. She has an excellent thrill in the vein appears to be a reasonable in size. I think it would be reasonable to attempt cannulation of the fistula in approximately 2 weeks. Once we know that the fistula is working we can arrange to have her catheter removed.

## 2012-08-19 ENCOUNTER — Other Ambulatory Visit (HOSPITAL_COMMUNITY): Payer: Self-pay | Admitting: Nephrology

## 2012-08-19 DIAGNOSIS — N186 End stage renal disease: Secondary | ICD-10-CM

## 2012-08-25 ENCOUNTER — Other Ambulatory Visit (HOSPITAL_COMMUNITY): Payer: Self-pay | Admitting: Nephrology

## 2012-08-25 ENCOUNTER — Ambulatory Visit (HOSPITAL_COMMUNITY)
Admission: RE | Admit: 2012-08-25 | Discharge: 2012-08-25 | Disposition: A | Discharge status: Home / Self Care | Payer: Medicare Other | Source: Ambulatory Visit | Attending: Nephrology | Admitting: Nephrology

## 2012-08-25 DIAGNOSIS — Z4901 Encounter for fitting and adjustment of extracorporeal dialysis catheter: Secondary | ICD-10-CM | POA: Insufficient documentation

## 2012-08-25 DIAGNOSIS — N186 End stage renal disease: Secondary | ICD-10-CM

## 2012-08-25 DIAGNOSIS — Z992 Dependence on renal dialysis: Secondary | ICD-10-CM | POA: Insufficient documentation

## 2012-08-25 MED ORDER — CHLORHEXIDINE GLUCONATE 4 % EX LIQD
CUTANEOUS | Status: AC
  Filled 2012-08-25: qty 45

## 2012-08-25 NOTE — Procedures (Signed)
Interventional Radiology Procedure Note  Procedure: Removal left subclavian tunneled HD catheter Complications: None Recommendations: - Dressing change BID - May begin showering once skin exit site fully scabbed over  Signed,  Criselda Peaches, MD Vascular & Interventional Radiologist [facility] Radiology

## 2012-09-04 ENCOUNTER — Other Ambulatory Visit: Payer: Self-pay

## 2012-09-04 DIAGNOSIS — T82598A Other mechanical complication of other cardiac and vascular devices and implants, initial encounter: Secondary | ICD-10-CM

## 2012-09-04 DIAGNOSIS — T82898A Other specified complication of vascular prosthetic devices, implants and grafts, initial encounter: Secondary | ICD-10-CM

## 2012-09-21 ENCOUNTER — Encounter: Payer: Self-pay | Admitting: Vascular Surgery

## 2012-09-22 ENCOUNTER — Encounter: Payer: Self-pay | Admitting: Vascular Surgery

## 2012-09-22 ENCOUNTER — Encounter (INDEPENDENT_AMBULATORY_CARE_PROVIDER_SITE_OTHER): Payer: Medicare Other | Admitting: *Deleted

## 2012-09-22 ENCOUNTER — Ambulatory Visit (INDEPENDENT_AMBULATORY_CARE_PROVIDER_SITE_OTHER): Payer: Medicare Other | Admitting: Vascular Surgery

## 2012-09-22 VITALS — BP 154/88 | HR 95 | Resp 14 | Ht 62.0 in | Wt 128.5 lb

## 2012-09-22 DIAGNOSIS — T82898A Other specified complication of vascular prosthetic devices, implants and grafts, initial encounter: Secondary | ICD-10-CM

## 2012-09-22 DIAGNOSIS — T82598A Other mechanical complication of other cardiac and vascular devices and implants, initial encounter: Secondary | ICD-10-CM

## 2012-09-22 DIAGNOSIS — N186 End stage renal disease: Secondary | ICD-10-CM

## 2012-09-22 DIAGNOSIS — M79609 Pain in unspecified limb: Secondary | ICD-10-CM

## 2012-09-22 HISTORY — DX: Other mechanical complication of other cardiac and vascular devices and implants, initial encounter: T82.598A

## 2012-09-22 NOTE — Progress Notes (Signed)
The patient has today for followup of her right upper arm AV fistula. She had revision of this in [place] January 2014 by Dr. Scot Dock. He had ligation of several competing branches in the bovine pericardial patch of the AV anastomosis. She had an infiltration yesterday near the upper part of the shoulder and is here today for concern of this. She does report pain associated with access in the near the antecubital space and has been avoiding this due to pain issues.  Past Medical History  Diagnosis Date  . Retinopathy   . Metabolic bone disease   . Anemia   . CHF (congestive heart failure)   . Pneumonia   . Blood transfusion   . Chronic kidney disease     M-W-F  . Hypertension     sees Dr. Carolin Guernsey  . Diabetes mellitus     "controlled with diet"    History  Substance Use Topics  . Smoking status: Never Smoker   . Smokeless tobacco: Never Used  . Alcohol Use: No    Family History  Problem Relation Age of Onset  . Malignant hyperthermia Mother   . Malignant hyperthermia Father   . Anesthesia problems Neg Hx     No Known Allergies  Current outpatient prescriptions:amLODipine (NORVASC) 10 MG tablet, Take 10 mg by mouth at bedtime. , Disp: , Rfl: ;  B Complex-C-Folic Acid (DIALYVITE Q000111Q PO), Take 1 tablet by mouth daily. , Disp: , Rfl: ;  calcium acetate (PHOSLO) 667 MG capsule, Take 1,334 mg by mouth 3 (three) times daily with meals., Disp: , Rfl: ;  calcium acetate (PHOSLO) 667 MG capsule, , Disp: , Rfl:  calcium acetate (PHOSLO) 667 MG capsule, 3 (three) times daily before meals., Disp: , Rfl: ;  cloNIDine (CATAPRES) 0.1 MG tablet, Take 0.2 mg by mouth at bedtime. , Disp: , Rfl: ;  lisinopril (PRINIVIL,ZESTRIL) 40 MG tablet, Take 40 mg by mouth 2 (two) times daily. , Disp: , Rfl: ;  oxyCODONE (ROXICODONE) 5 MG immediate release tablet, Take 1 tablet (5 mg total) by mouth every 4 (four) hours as needed for pain., Disp: 30 tablet, Rfl: 0 pravastatin (PRAVACHOL) 20 MG tablet, at  bedtime., Disp: , Rfl:   BP 154/88  Pulse 95  Resp 14  Ht 5\' 2"  (1.575 m)  Wt 128 lb 8.5 oz (58.3 kg)  BMI 23.5 kg/m2  SpO2 100%  LMP 08/13/2012  Body mass index is 23.5 kg/(m^2).       Physical exam well-developed white female no acute distress She does have a palpable radial pulse on the right. She does have excellent thrill and good size maturation of her fistula. There is some mild infiltration bruising at the near the deltopectoral groove at the upper end of her fistula  She did undergo a venous duplex today of her fistula and I have reviewed this with her. This shows good size maturation throughout its course. There are some elevated velocities at the anastomosis but she has good flow throughout the fistula.  I discussed this at length with the patient. I do not see any options for improvement of her fistula. She does have good size and good flow. This will provide long-term access for her. The dialysis staff will continue rotating access.

## 2012-12-09 ENCOUNTER — Ambulatory Visit: Payer: Self-pay | Admitting: Vascular Surgery

## 2012-12-09 LAB — BASIC METABOLIC PANEL
Anion Gap: 7 (ref 7–16)
BUN: 72 mg/dL — ABNORMAL HIGH (ref 7–18)
Calcium, Total: 9.6 mg/dL (ref 8.5–10.1)
Chloride: 103 mmol/L (ref 98–107)
Co2: 29 mmol/L (ref 21–32)
Creatinine: 8.2 mg/dL — ABNORMAL HIGH (ref 0.60–1.30)
EGFR (African American): 6 — ABNORMAL LOW
EGFR (Non-African Amer.): 5 — ABNORMAL LOW
Glucose: 138 mg/dL — ABNORMAL HIGH (ref 65–99)
Osmolality: 301 (ref 275–301)
Potassium: 5.1 mmol/L (ref 3.5–5.1)
Sodium: 139 mmol/L (ref 136–145)

## 2012-12-09 LAB — HCG, QUANTITATIVE, PREGNANCY: Beta Hcg, Quant.: <1 m[IU]/mL — ABNORMAL LOW

## 2012-12-09 LAB — POTASSIUM: Potassium: 5.1 mmol/L (ref 3.5–5.1)

## 2012-12-18 ENCOUNTER — Encounter (HOSPITAL_COMMUNITY): Payer: Self-pay | Admitting: Emergency Medicine

## 2012-12-18 ENCOUNTER — Emergency Department (HOSPITAL_COMMUNITY)
Admission: EM | Admit: 2012-12-18 | Discharge: 2012-12-18 | Disposition: A | Discharge status: Home / Self Care | Payer: Medicare Other | Attending: Emergency Medicine | Admitting: Emergency Medicine

## 2012-12-18 DIAGNOSIS — Z79899 Other long term (current) drug therapy: Secondary | ICD-10-CM | POA: Insufficient documentation

## 2012-12-18 DIAGNOSIS — G8929 Other chronic pain: Secondary | ICD-10-CM | POA: Insufficient documentation

## 2012-12-18 DIAGNOSIS — I120 Hypertensive chronic kidney disease with stage 5 chronic kidney disease or end stage renal disease: Secondary | ICD-10-CM | POA: Insufficient documentation

## 2012-12-18 DIAGNOSIS — Z992 Dependence on renal dialysis: Secondary | ICD-10-CM | POA: Insufficient documentation

## 2012-12-18 DIAGNOSIS — D649 Anemia, unspecified: Secondary | ICD-10-CM | POA: Insufficient documentation

## 2012-12-18 DIAGNOSIS — Z8739 Personal history of other diseases of the musculoskeletal system and connective tissue: Secondary | ICD-10-CM | POA: Insufficient documentation

## 2012-12-18 DIAGNOSIS — I509 Heart failure, unspecified: Secondary | ICD-10-CM | POA: Insufficient documentation

## 2012-12-18 DIAGNOSIS — M79641 Pain in right hand: Secondary | ICD-10-CM

## 2012-12-18 DIAGNOSIS — E119 Type 2 diabetes mellitus without complications: Secondary | ICD-10-CM | POA: Insufficient documentation

## 2012-12-18 DIAGNOSIS — Z8669 Personal history of other diseases of the nervous system and sense organs: Secondary | ICD-10-CM | POA: Insufficient documentation

## 2012-12-18 DIAGNOSIS — Z8701 Personal history of pneumonia (recurrent): Secondary | ICD-10-CM | POA: Insufficient documentation

## 2012-12-18 DIAGNOSIS — M79609 Pain in unspecified limb: Secondary | ICD-10-CM | POA: Insufficient documentation

## 2012-12-18 DIAGNOSIS — N186 End stage renal disease: Secondary | ICD-10-CM | POA: Insufficient documentation

## 2012-12-18 MED ORDER — OXYCODONE-ACETAMINOPHEN 5-325 MG PO TABS
1.0000 | ORAL_TABLET | Freq: Once | ORAL | Status: AC
  Administered 2012-12-18: 1 via ORAL
  Filled 2012-12-18: qty 1

## 2012-12-18 MED ORDER — OXYCODONE-ACETAMINOPHEN 5-325 MG PO TABS: 1.0000 | ORAL_TABLET | ORAL | Status: DC | PRN

## 2012-12-18 NOTE — ED Provider Notes (Signed)
History    CSN: [id number] Arrival date & time 12/18/12  1057  First MD Initiated Contact with Patient 12/18/12 1137     Chief Complaint  Patient presents with  . Hand Pain   (Consider location/radiation/quality/duration/timing/severity/associated sxs/prior Treatment) Patient is a 42 y.o. female presenting with hand pain. The history is provided by the patient. No language interpreter was used.  Hand Pain This is a chronic problem. The current episode started more than 1 month ago. Pertinent negatives include no chest pain, chills, fever or numbness. Associated symptoms comments: She complains of pain in the right hand that has been progressive since revision of her AV fistual in January of this year by Dr. Scot Dock. She has not had any difficulty with dialysis and has been treated regularly without failure. No swelling or discoloration. She reports she has seen both her nephrologist in [place] and the vascular surgeon and there is discussion of possible Steal Syndrome per patient..   Past Medical History  Diagnosis Date  . Retinopathy   . Metabolic bone disease   . Anemia   . CHF (congestive heart failure)   . Pneumonia   . Blood transfusion   . Chronic kidney disease     M-W-F  . Hypertension     sees Dr. Carolin Guernsey  . Diabetes mellitus     "controlled with diet"   Past Surgical History  Procedure Laterality Date  . Insertion of dialysis catheter  10/04/2011    Procedure: INSERTION OF DIALYSIS CATHETER;  Surgeon: Angelia Mould, MD;  Location: [place];  Service: Vascular;  Laterality: Right;  insertion of dialysis catheter right internal jugular  . Av fistula placement  10/04/2011    Procedure: INSERTION OF ARTERIOVENOUS (AV) GORE-TEX GRAFT ARM;  Surgeon: Angelia Mould, MD;  Location: [place];  Service: Vascular;  Laterality: Left;  Insertion left upper arm Arteriovenous goretex graft  . Avgg removal  10/04/2011    Procedure: REMOVAL OF ARTERIOVENOUS GORETEX GRAFT  ([place]);  Surgeon: Elam Dutch, MD;  Location: [place];  Service: Vascular;  Laterality: Left;  . Av fistula placement  10/29/2011    Procedure: ARTERIOVENOUS (AV) FISTULA CREATION;  Surgeon: Angelia Mould, MD;  Location: [facility] OR;  Service: Vascular;  Laterality: Right;  Creation Right Arteriovenous Fistula   . Revison of arteriovenous fistula  06/23/2012    Procedure: REVISON OF ARTERIOVENOUS FISTULA;  Surgeon: Angelia Mould, MD;  Location: [place];  Service: Vascular;  Laterality: Right;  Ultrasound guided  . Insertion of dialysis catheter  06/23/2012    Procedure: INSERTION OF DIALYSIS CATHETER;  Surgeon: Angelia Mould, MD;  Location: [place];  Service: Vascular;  Laterality: N/A;  Ultrasound guided  . Patch angioplasty  06/23/2012    Procedure: PATCH ANGIOPLASTY;  Surgeon: Angelia Mould, MD;  Location: [facility] OR;  Service: Vascular;  Laterality: Right;   Family History  Problem Relation Age of Onset  . Malignant hyperthermia Mother   . Malignant hyperthermia Father   . Anesthesia problems Neg Hx    History  Substance Use Topics  . Smoking status: Never Smoker   . Smokeless tobacco: Never Used  . Alcohol Use: No   OB History   Grav Para Term Preterm Abortions TAB SAB Ect Mult Living                 Review of Systems  Constitutional: Negative for fever and chills.  Respiratory: Negative.  Negative for shortness of breath.  Cardiovascular: Negative.  Negative for chest pain.  Musculoskeletal:       See HPI.  Skin: Negative.  Negative for color change, pallor and wound.  Neurological: Negative.  Negative for numbness.    Allergies  Review of patient's allergies indicates no known allergies.  Home Medications   Current Outpatient Rx  Name  Route  Sig  Dispense  Refill  . amLODipine (NORVASC) 10 MG tablet   Oral   Take 10 mg by mouth at bedtime.          . Aspirin-Acetaminophen-Caffeine (EXCEDRIN PO)   Oral   Take 2 tablets by mouth once.          . B Complex-C-Folic Acid (DIALYVITE Q000111Q PO)   Oral   Take 1 tablet by mouth daily.          . calcium acetate (PHOSLO) 667 MG capsule   Oral   Take 1,334 mg by mouth 3 (three) times daily with meals.         . cloNIDine (CATAPRES) 0.3 MG tablet   Oral   Take 0.3 mg by mouth 2 (two) times daily. Take two at bedtime.         Marland Kitchen lisinopril (PRINIVIL,ZESTRIL) 20 MG tablet   Oral   Take 40 mg by mouth daily. Take 40 mg at bedtime.         . pravastatin (PRAVACHOL) 20 MG tablet   Oral   Take 20 mg by mouth at bedtime.           BP 210/83  Pulse 106  Temp(Src) 98.6 F (37 C) (Oral)  Resp 16  SpO2 97%  LMP 12/11/2012 Physical Exam  Constitutional: She appears well-developed and well-nourished.  HENT:  Head: Normocephalic.  Neck: Normal range of motion. Neck supple.  Pulmonary/Chest: Effort normal.  Musculoskeletal: Normal range of motion. She exhibits no edema.  Right hand cool to touch when compared to left. No swelling or discoloration. Graft right upper arm has positive strong thrill. Radial and ulnar pulses easily dopplerable.  Neurological: She is alert. No cranial nerve deficit.  Skin: Skin is warm and dry. No rash noted.  Psychiatric: She has a normal mood and affect.    ED Course  Procedures (including critical care time) Labs Reviewed - No data to display No results found. No diagnosis found. 1. Hand pain 2. ESRD-HD MDM  Notes reviewed. Discussed with dialysis center as she was supposed to have a treatment today but missed to come here. They report they can dialyze today or tomorrow and this was relayed to the patient. Pain medication given. Stable for discharge.   Dewaine Oats, PA-C 12/18/12 1234

## 2012-12-18 NOTE — ED Notes (Signed)
Pt. Stated, I have "Still Syndrome" and my fistula is cutting off my circulation and my hand is in a lot of pain.

## 2012-12-18 NOTE — ED Provider Notes (Signed)
Medical screening examination/treatment/procedure(s) were performed by non-physician practitioner and as supervising physician I was immediately available for consultation/collaboration.  Mervin Kung, MD 12/18/12 [phone]

## 2012-12-19 ENCOUNTER — Observation Stay (HOSPITAL_COMMUNITY)
Admission: EM | Admit: 2012-12-19 | Discharge: 2012-12-22 | Disposition: A | Discharge status: Home / Self Care | Payer: Medicare Other | Attending: Internal Medicine | Admitting: Internal Medicine

## 2012-12-19 ENCOUNTER — Encounter (HOSPITAL_COMMUNITY): Payer: Self-pay | Admitting: Emergency Medicine

## 2012-12-19 DIAGNOSIS — D649 Anemia, unspecified: Secondary | ICD-10-CM | POA: Insufficient documentation

## 2012-12-19 DIAGNOSIS — E7800 Pure hypercholesterolemia, unspecified: Secondary | ICD-10-CM | POA: Diagnosis present

## 2012-12-19 DIAGNOSIS — Z992 Dependence on renal dialysis: Secondary | ICD-10-CM | POA: Insufficient documentation

## 2012-12-19 DIAGNOSIS — N186 End stage renal disease: Secondary | ICD-10-CM | POA: Diagnosis present

## 2012-12-19 DIAGNOSIS — Z79899 Other long term (current) drug therapy: Secondary | ICD-10-CM | POA: Insufficient documentation

## 2012-12-19 DIAGNOSIS — N390 Urinary tract infection, site not specified: Secondary | ICD-10-CM | POA: Insufficient documentation

## 2012-12-19 DIAGNOSIS — L98499 Non-pressure chronic ulcer of skin of other sites with unspecified severity: Secondary | ICD-10-CM | POA: Insufficient documentation

## 2012-12-19 DIAGNOSIS — I509 Heart failure, unspecified: Secondary | ICD-10-CM | POA: Insufficient documentation

## 2012-12-19 DIAGNOSIS — E119 Type 2 diabetes mellitus without complications: Secondary | ICD-10-CM | POA: Insufficient documentation

## 2012-12-19 DIAGNOSIS — N184 Chronic kidney disease, stage 4 (severe): Secondary | ICD-10-CM

## 2012-12-19 DIAGNOSIS — E1151 Type 2 diabetes mellitus with diabetic peripheral angiopathy without gangrene: Secondary | ICD-10-CM | POA: Diagnosis present

## 2012-12-19 DIAGNOSIS — E785 Hyperlipidemia, unspecified: Secondary | ICD-10-CM | POA: Diagnosis present

## 2012-12-19 DIAGNOSIS — N189 Chronic kidney disease, unspecified: Secondary | ICD-10-CM | POA: Insufficient documentation

## 2012-12-19 DIAGNOSIS — M899 Disorder of bone, unspecified: Secondary | ICD-10-CM | POA: Insufficient documentation

## 2012-12-19 DIAGNOSIS — E1169 Type 2 diabetes mellitus with other specified complication: Secondary | ICD-10-CM | POA: Diagnosis present

## 2012-12-19 DIAGNOSIS — R112 Nausea with vomiting, unspecified: Principal | ICD-10-CM | POA: Diagnosis present

## 2012-12-19 DIAGNOSIS — I10 Essential (primary) hypertension: Secondary | ICD-10-CM | POA: Diagnosis present

## 2012-12-19 DIAGNOSIS — G458 Other transient cerebral ischemic attacks and related syndromes: Secondary | ICD-10-CM | POA: Insufficient documentation

## 2012-12-19 DIAGNOSIS — Z794 Long term (current) use of insulin: Secondary | ICD-10-CM | POA: Insufficient documentation

## 2012-12-19 DIAGNOSIS — I129 Hypertensive chronic kidney disease with stage 1 through stage 4 chronic kidney disease, or unspecified chronic kidney disease: Secondary | ICD-10-CM | POA: Insufficient documentation

## 2012-12-19 DIAGNOSIS — M949 Disorder of cartilage, unspecified: Secondary | ICD-10-CM | POA: Insufficient documentation

## 2012-12-19 DIAGNOSIS — IMO0002 Reserved for concepts with insufficient information to code with codable children: Secondary | ICD-10-CM | POA: Diagnosis present

## 2012-12-19 LAB — COMPREHENSIVE METABOLIC PANEL
ALT: 16 U/L (ref 0–35)
AST: 17 U/L (ref 0–37)
Albumin: 4 g/dL (ref 3.5–5.2)
Alkaline Phosphatase: 70 U/L (ref 39–117)
BUN: 50 mg/dL — ABNORMAL HIGH (ref 6–23)
CO2: 23 mEq/L (ref 19–32)
Calcium: 10.7 mg/dL — ABNORMAL HIGH (ref 8.4–10.5)
Chloride: 86 mEq/L — ABNORMAL LOW (ref 96–112)
Creatinine, Ser: 8.52 mg/dL — ABNORMAL HIGH (ref 0.50–1.10)
GFR calc Af Amer: 6 mL/min — ABNORMAL LOW (ref >90)
GFR calc non Af Amer: 5 mL/min — ABNORMAL LOW (ref >90)
Glucose, Bld: 73 mg/dL (ref 70–99)
Potassium: 3.9 mEq/L (ref 3.5–5.1)
Sodium: 136 mEq/L (ref 135–145)
Total Bilirubin: 0.1 mg/dL — ABNORMAL LOW (ref 0.3–1.2)
Total Protein: 8.3 g/dL (ref 6.0–8.3)

## 2012-12-19 LAB — CBC WITH DIFFERENTIAL/PLATELET
Basophils Absolute: 0.1 10*3/uL (ref 0.0–0.1)
Basophils Relative: 1 % (ref 0–1)
Eosinophils Absolute: 0.4 10*3/uL (ref 0.0–0.7)
Eosinophils Relative: 3 % (ref 0–5)
HCT: 40.5 % (ref 36.0–46.0)
Hemoglobin: 13.3 g/dL (ref 12.0–15.0)
Lymphocytes Relative: 23 % (ref 12–46)
Lymphs Abs: 2.4 10*3/uL (ref 0.7–4.0)
MCH: 28.5 pg (ref 26.0–34.0)
MCHC: 32.8 g/dL (ref 30.0–36.0)
MCV: 86.9 fL (ref 78.0–100.0)
Monocytes Absolute: 0.6 10*3/uL (ref 0.1–1.0)
Monocytes Relative: 5 % (ref 3–12)
Neutro Abs: 7.2 10*3/uL (ref 1.7–7.7)
Neutrophils Relative %: 68 % (ref 43–77)
Platelets: 369 10*3/uL (ref 150–400)
RBC: 4.66 MIL/uL (ref 3.87–5.11)
RDW: 13.9 % (ref 11.5–15.5)
WBC: 10.6 10*3/uL — ABNORMAL HIGH (ref 4.0–10.5)

## 2012-12-19 MED ORDER — ONDANSETRON HCL 4 MG/2ML IJ SOLN
4.0000 mg | Freq: Once | INTRAMUSCULAR | Status: AC
  Administered 2012-12-20: 4 mg via INTRAVENOUS
  Filled 2012-12-19: qty 2

## 2012-12-19 MED ORDER — SODIUM CHLORIDE 0.9 % IV BOLUS (SEPSIS)
500.0000 mL | Freq: Once | INTRAVENOUS | Status: AC
  Administered 2012-12-20: 500 mL via INTRAVENOUS

## 2012-12-19 NOTE — ED Notes (Signed)
PT. REPORTS NAUSEA AND VOMITTING AFTER RECEIVING HEP A VACCINATION LAST Monday , SHE MISSED HER HEMODIALYSIS YESTERDAY , DENIES FEVER OR CHILLS / RESPIRATIONS UNLABORED .

## 2012-12-19 NOTE — ED Provider Notes (Signed)
History    CSN: [id number] Arrival date & time 12/19/12  2058  First MD Initiated Contact with Patient 12/19/12 2305     Chief Complaint  Patient presents with  . Emesis   (Consider location/radiation/quality/duration/timing/severity/associated sxs/prior Treatment) HPI Comments: 42 year old female with a history of diabetes, hypertension, end-stage renal disease dialyzing Monday Wednesday and Friday and a history of congestive heart failure. She presents with a complaint of nausea and vomiting which she states started on Monday after receiving vaccinations for hepatitis. Since that time she has been unable to hold down any food or fluids, she admits to being able to take her medications at night though she has not had them this evening. The symptoms are persistent, nothing seems to make it better or worse, it is not associated with headaches, chest pain, cough, shortness of breath, back pain, neck pain, stiffness, sore throat, blurred vision, rashes, swelling, dysuria (still makes a significant amount of urine, urinating 4 times a day).    Patient is a 42 y.o. female presenting with vomiting. The history is provided by the patient and a relative.  Emesis  Past Medical History  Diagnosis Date  . Retinopathy   . Metabolic bone disease   . Anemia   . CHF (congestive heart failure)   . Pneumonia   . Blood transfusion   . Chronic kidney disease     M-W-F  . Hypertension     sees Dr. Carolin Guernsey  . Diabetes mellitus     "controlled with diet"  . Hemodialysis patient    Past Surgical History  Procedure Laterality Date  . Insertion of dialysis catheter  10/04/2011    Procedure: INSERTION OF DIALYSIS CATHETER;  Surgeon: Angelia Mould, MD;  Location: [place];  Service: Vascular;  Laterality: Right;  insertion of dialysis catheter right internal jugular  . Av fistula placement  10/04/2011    Procedure: INSERTION OF ARTERIOVENOUS (AV) GORE-TEX GRAFT ARM;  Surgeon: Angelia Mould, MD;  Location: [place];  Service: Vascular;  Laterality: Left;  Insertion left upper arm Arteriovenous goretex graft  . Avgg removal  10/04/2011    Procedure: REMOVAL OF ARTERIOVENOUS GORETEX GRAFT ([place]);  Surgeon: Elam Dutch, MD;  Location: [place];  Service: Vascular;  Laterality: Left;  . Av fistula placement  10/29/2011    Procedure: ARTERIOVENOUS (AV) FISTULA CREATION;  Surgeon: Angelia Mould, MD;  Location: [facility] OR;  Service: Vascular;  Laterality: Right;  Creation Right Arteriovenous Fistula   . Revison of arteriovenous fistula  06/23/2012    Procedure: REVISON OF ARTERIOVENOUS FISTULA;  Surgeon: Angelia Mould, MD;  Location: [place];  Service: Vascular;  Laterality: Right;  Ultrasound guided  . Insertion of dialysis catheter  06/23/2012    Procedure: INSERTION OF DIALYSIS CATHETER;  Surgeon: Angelia Mould, MD;  Location: [place];  Service: Vascular;  Laterality: N/A;  Ultrasound guided  . Patch angioplasty  06/23/2012    Procedure: PATCH ANGIOPLASTY;  Surgeon: Angelia Mould, MD;  Location: [facility] OR;  Service: Vascular;  Laterality: Right;   Family History  Problem Relation Age of Onset  . Malignant hyperthermia Mother   . Malignant hyperthermia Father   . Anesthesia problems Neg Hx    History  Substance Use Topics  . Smoking status: Never Smoker   . Smokeless tobacco: Never Used  . Alcohol Use: No   OB History   Grav Para Term Preterm Abortions TAB SAB Ect Mult Living  Review of Systems  Gastrointestinal: Positive for vomiting.  All other systems reviewed and are negative.    Allergies  Review of patient's allergies indicates no known allergies.  Home Medications   Current Outpatient Rx  Name  Route  Sig  Dispense  Refill  . amLODipine (NORVASC) 10 MG tablet   Oral   Take 10 mg by mouth at bedtime.          . B Complex-C-Folic Acid (DIALYVITE Q000111Q PO)   Oral   Take 1 tablet by mouth at bedtime.          . calcium  acetate (PHOSLO) 667 MG capsule   Oral   Take 1,334 mg by mouth 3 (three) times daily with meals.         . cloNIDine (CATAPRES) 0.3 MG tablet   Oral   Take 0.3-0.6 mg by mouth 2 (two) times daily. Take one tablet in the morning. Take two at bedtime.         Marland Kitchen lisinopril (PRINIVIL,ZESTRIL) 20 MG tablet   Oral   Take 40 mg by mouth at bedtime.          Marland Kitchen oxyCODONE-acetaminophen (PERCOCET/ROXICET) 5-325 MG per tablet   Oral   Take 1 tablet by mouth every 4 (four) hours as needed for pain.   15 tablet   0   . pravastatin (PRAVACHOL) 20 MG tablet   Oral   Take 20 mg by mouth at bedtime.           BP 203/77  Pulse 111  Temp(Src) 98.5 F (36.9 C) (Oral)  Resp 14  SpO2 99%  LMP 12/11/2012 Physical Exam  Nursing note and vitals reviewed. Constitutional: She appears well-developed and well-nourished. No distress.  HENT:  Head: Normocephalic and atraumatic.  Mouth/Throat: Oropharynx is clear and moist. No oropharyngeal exudate.  Eyes: Conjunctivae and EOM are normal. Pupils are equal, round, and reactive to light. Right eye exhibits no discharge. Left eye exhibits no discharge. No scleral icterus.  Neck: Normal range of motion. Neck supple. No JVD present. No thyromegaly present.  Cardiovascular: Normal rate, regular rhythm, normal heart sounds and intact distal pulses.  Exam reveals no gallop and no friction rub.   No murmur heard. And mild tachycardia, 105 beats per minute. Right upper extremity fistula with a normal thrill  Pulmonary/Chest: Effort normal and breath sounds normal. No respiratory distress. She has no wheezes. She has no rales.  Abdominal: Soft. Bowel sounds are normal. She exhibits no distension and no mass. There is tenderness ( Mild diffuse tenderness, no guarding, no masses, no Murphy sign).  No peritoneal signs  Musculoskeletal: Normal range of motion. She exhibits no edema and no tenderness.  Lymphadenopathy:    She has no cervical adenopathy.   Neurological: She is alert. Coordination normal.  Skin: Skin is warm and dry. No rash noted. No erythema.  Psychiatric: She has a normal mood and affect. Her behavior is normal.    ED Course  Procedures (including critical care time) Labs Reviewed  CBC WITH DIFFERENTIAL - Abnormal; Notable for the following:    WBC 10.6 (*)    All other components within normal limits  COMPREHENSIVE METABOLIC PANEL - Abnormal; Notable for the following:    Chloride 86 (*)    BUN 50 (*)    Creatinine, Ser 8.52 (*)    Calcium 10.7 (*)    Total Bilirubin 0.1 (*)    GFR calc non Af Amer 5 (*)  GFR calc Af Amer 6 (*)    All other components within normal limits  URINALYSIS, ROUTINE W REFLEX MICROSCOPIC - Abnormal; Notable for the following:    APPearance CLOUDY (*)    Hgb urine dipstick MODERATE (*)    Bilirubin Urine SMALL (*)    Ketones, ur 40 (*)    Protein, ur >300 (*)    Leukocytes, UA SMALL (*)    All other components within normal limits  URINE MICROSCOPIC-ADD ON - Abnormal; Notable for the following:    Squamous Epithelial / LPF MANY (*)    Bacteria, UA MANY (*)    Casts HYALINE CASTS (*)    All other components within normal limits  URINE CULTURE  TROPONIN I   Dg Chest 2 View  12/20/2012   *RADIOLOGY REPORT*  Clinical Data: Shortness of breath.  Nausea and vomiting since Tuesday.  Hepatitis that seen on Monday.  CHEST - 2 VIEW  Comparison: 06/23/2012  Findings: Interval removal of central venous catheter. The heart size and pulmonary vascularity are normal. The lungs appear clear and expanded without focal air space disease or consolidation. No blunting of the costophrenic angles.  No pneumothorax.  Mediastinal contours appear intact.  IMPRESSION: No evidence of active pulmonary disease.   Original Report Authenticated By: Lucienne Capers, M.D.   1. Nausea and vomiting   2. UTI (lower urinary tract infection)   3. Hypertension     MDM  The patient has nonspecific nausea and  vomiting, she is now mildly tachycardic which I suspect from dehydration, her labs do not suggest hyperkalemia, her lung exam does not suggest pulmonary edema. She missed dialysis yesterday because she was given Percocet for her right upper extremity pain secondary to steal syndrome which she has been diagnosed with by a vascular surgeon in [place]. The Percocet made her sleepy and she missed dialysis. She does not appear in distress but will need antiemetics, further workup for her nausea.  ED ECG REPORT  I personally interpreted this EKG   Date: 12/20/2012   Rate: 111  Rhythm: sinus tachycardia  QRS Axis: normal  Intervals: normal  ST/T Wave abnormalities: normal  Conduction Disutrbances:none  Narrative Interpretation: LVH criteria are present   Old EKG Reviewed: Compared with 10/12/2011, no significant changes are seen, LVH still present  Improved significantly, mild tachycardia at 102 beats per minute at this time, IV fluids have been given, ketonuria and a urinary tract infection is present. Her nausea has resolved, she is requesting fluids and a by mouth trial.  She has had elevated BP - it has been as high as [id number] systolic - she states she has not had her evening BP meds.  Will give clonidine and lisinopril.   She does not have pulmonary edema and has had some IVF as she appears dehydrated.  She has no SOB.  Lytes normal.  After 2 IVF boluses of 500cc each and antibiotics / antiemetics and he antihypertensives she still has multiple abnormal VS including severe Htn and tachcyardia with pulse of 118 on my last exam.  This may be related to combination of dehydration and UTI.    Will admit for ongoing treatment.   D/w Dr. Arnoldo Morale who agrees to admit  Meds given in ED:  Medications  ondansetron [facility]) injection 4 mg (4 mg Intravenous Given 12/20/12 0016)  sodium chloride 0.9 % bolus 500 mL (0 mLs Intravenous Stopped 12/20/12 0239)  cefTRIAXone (ROCEPHIN) 1 g in dextrose 5 % 50 mL IVPB  (0  g Intravenous Stopped 12/20/12 0419)  sodium chloride 0.9 % bolus 500 mL (500 mLs Intravenous New Bag/Given 12/20/12 0302)  cloNIDine (CATAPRES) tablet 0.2 mg (0.2 mg Oral Given 12/20/12 0324)  lisinopril (PRINIVIL,ZESTRIL) tablet 20 mg (20 mg Oral Given 12/20/12 0325)  oxyCODONE-acetaminophen (PERCOCET/ROXICET) 5-325 MG per tablet 2 tablet (2 tablets Oral Given 12/20/12 0424)    New Prescriptions   No medications on file      Johnna Acosta, MD 12/20/12 [phone]

## 2012-12-20 ENCOUNTER — Encounter (HOSPITAL_COMMUNITY): Payer: Self-pay | Admitting: Urology

## 2012-12-20 ENCOUNTER — Emergency Department (HOSPITAL_COMMUNITY): Payer: Medicare Other

## 2012-12-20 DIAGNOSIS — N184 Chronic kidney disease, stage 4 (severe): Secondary | ICD-10-CM

## 2012-12-20 DIAGNOSIS — R112 Nausea with vomiting, unspecified: Secondary | ICD-10-CM

## 2012-12-20 DIAGNOSIS — I10 Essential (primary) hypertension: Secondary | ICD-10-CM

## 2012-12-20 DIAGNOSIS — E119 Type 2 diabetes mellitus without complications: Secondary | ICD-10-CM

## 2012-12-20 DIAGNOSIS — N186 End stage renal disease: Secondary | ICD-10-CM

## 2012-12-20 DIAGNOSIS — K920 Hematemesis: Secondary | ICD-10-CM

## 2012-12-20 LAB — CBC
HCT: 33.8 % — ABNORMAL LOW (ref 36.0–46.0)
Hemoglobin: 11.5 g/dL — ABNORMAL LOW (ref 12.0–15.0)
MCH: 28.9 pg (ref 26.0–34.0)
MCHC: 34 g/dL (ref 30.0–36.0)
MCV: 84.9 fL (ref 78.0–100.0)
Platelets: 310 10*3/uL (ref 150–400)
RBC: 3.98 MIL/uL (ref 3.87–5.11)
RDW: 13.4 % (ref 11.5–15.5)
WBC: 10.6 10*3/uL — ABNORMAL HIGH (ref 4.0–10.5)

## 2012-12-20 LAB — URINALYSIS, ROUTINE W REFLEX MICROSCOPIC
Glucose, UA: NEGATIVE mg/dL
Ketones, ur: 40 mg/dL — AB
Nitrite: NEGATIVE
Protein, ur: >300 mg/dL — AB
Specific Gravity, Urine: 1.019 (ref 1.005–1.030)
Urobilinogen, UA: 0.2 mg/dL (ref 0.0–1.0)
pH: 5.5 (ref 5.0–8.0)

## 2012-12-20 LAB — HEMOGLOBIN A1C
Hgb A1c MFr Bld: 6.3 % — ABNORMAL HIGH (ref <5.7)
Mean Plasma Glucose: 134 mg/dL — ABNORMAL HIGH (ref <117)

## 2012-12-20 LAB — GLUCOSE, CAPILLARY
Glucose-Capillary: 106 mg/dL — ABNORMAL HIGH (ref 70–99)
Glucose-Capillary: 106 mg/dL — ABNORMAL HIGH (ref 70–99)
Glucose-Capillary: 118 mg/dL — ABNORMAL HIGH (ref 70–99)
Glucose-Capillary: 139 mg/dL — ABNORMAL HIGH (ref 70–99)
Glucose-Capillary: 96 mg/dL (ref 70–99)

## 2012-12-20 LAB — BASIC METABOLIC PANEL
BUN: 49 mg/dL — ABNORMAL HIGH (ref 6–23)
CO2: 23 mEq/L (ref 19–32)
Calcium: 9.3 mg/dL (ref 8.4–10.5)
Chloride: 89 mEq/L — ABNORMAL LOW (ref 96–112)
Creatinine, Ser: 8.72 mg/dL — ABNORMAL HIGH (ref 0.50–1.10)
GFR calc Af Amer: 6 mL/min — ABNORMAL LOW (ref >90)
GFR calc non Af Amer: 5 mL/min — ABNORMAL LOW (ref >90)
Glucose, Bld: 204 mg/dL — ABNORMAL HIGH (ref 70–99)
Potassium: 4 mEq/L (ref 3.5–5.1)
Sodium: 132 mEq/L — ABNORMAL LOW (ref 135–145)

## 2012-12-20 LAB — URINE MICROSCOPIC-ADD ON

## 2012-12-20 LAB — MRSA PCR SCREENING: MRSA by PCR: NEGATIVE

## 2012-12-20 LAB — TROPONIN I: Troponin I: <0.3 ng/mL (ref <0.30)

## 2012-12-20 MED ORDER — SODIUM CHLORIDE 0.9 % IV SOLN: INTRAVENOUS | Status: DC

## 2012-12-20 MED ORDER — ACETAMINOPHEN 325 MG PO TABS: 650.0000 mg | ORAL_TABLET | Freq: Four times a day (QID) | ORAL | Status: DC | PRN

## 2012-12-20 MED ORDER — OXYCODONE HCL 5 MG PO TABS
5.0000 mg | ORAL_TABLET | ORAL | Status: DC | PRN
  Administered 2012-12-20: 5 mg via ORAL
  Filled 2012-12-20: qty 1

## 2012-12-20 MED ORDER — LABETALOL HCL 5 MG/ML IV SOLN
10.0000 mg | Freq: Once | INTRAVENOUS | Status: AC
  Administered 2012-12-20: 10 mg via INTRAVENOUS
  Filled 2012-12-20: qty 4

## 2012-12-20 MED ORDER — ENOXAPARIN SODIUM 30 MG/0.3ML ~~LOC~~ SOLN
30.0000 mg | SUBCUTANEOUS | Status: DC
  Administered 2012-12-20 – 2012-12-21 (×2): 30 mg via SUBCUTANEOUS
  Filled 2012-12-20 (×4): qty 0.3

## 2012-12-20 MED ORDER — HYDRALAZINE HCL 20 MG/ML IJ SOLN
10.0000 mg | INTRAMUSCULAR | Status: DC | PRN
  Administered 2012-12-20 – 2012-12-21 (×3): 10 mg via INTRAVENOUS
  Filled 2012-12-20 (×3): qty 1

## 2012-12-20 MED ORDER — CLONIDINE HCL 0.3 MG PO TABS
0.3000 mg | ORAL_TABLET | Freq: Three times a day (TID) | ORAL | Status: DC
  Administered 2012-12-20 – 2012-12-22 (×6): 0.3 mg via ORAL
  Filled 2012-12-20 (×10): qty 1

## 2012-12-20 MED ORDER — OXYCODONE-ACETAMINOPHEN 5-325 MG PO TABS
2.0000 | ORAL_TABLET | Freq: Once | ORAL | Status: AC
  Administered 2012-12-20: 2 via ORAL
  Filled 2012-12-20: qty 2

## 2012-12-20 MED ORDER — HYDROMORPHONE HCL PF 1 MG/ML IJ SOLN
0.5000 mg | INTRAMUSCULAR | Status: DC | PRN
  Administered 2012-12-21: 1 mg via INTRAVENOUS
  Filled 2012-12-20: qty 1

## 2012-12-20 MED ORDER — SIMVASTATIN 10 MG PO TABS
10.0000 mg | ORAL_TABLET | Freq: Every day | ORAL | Status: DC
  Administered 2012-12-20 – 2012-12-21 (×2): 10 mg via ORAL
  Filled 2012-12-20 (×3): qty 1

## 2012-12-20 MED ORDER — ALUM & MAG HYDROXIDE-SIMETH 200-200-20 MG/5ML PO SUSP
30.0000 mL | Freq: Four times a day (QID) | ORAL | Status: DC | PRN
  Filled 2012-12-20: qty 30

## 2012-12-20 MED ORDER — ACETAMINOPHEN 650 MG RE SUPP: 650.0000 mg | Freq: Four times a day (QID) | RECTAL | Status: DC | PRN

## 2012-12-20 MED ORDER — LISINOPRIL 20 MG PO TABS
20.0000 mg | ORAL_TABLET | Freq: Once | ORAL | Status: AC
  Administered 2012-12-20: 20 mg via ORAL
  Filled 2012-12-20: qty 1

## 2012-12-20 MED ORDER — OXYCODONE-ACETAMINOPHEN 5-325 MG PO TABS
1.0000 | ORAL_TABLET | Freq: Four times a day (QID) | ORAL | Status: DC | PRN
  Administered 2012-12-20 – 2012-12-21 (×3): 2 via ORAL
  Filled 2012-12-20 (×3): qty 2

## 2012-12-20 MED ORDER — ONDANSETRON HCL 4 MG/2ML IJ SOLN: 4.0000 mg | Freq: Three times a day (TID) | INTRAMUSCULAR | Status: AC | PRN

## 2012-12-20 MED ORDER — DEXTROSE 5 % IV SOLN
1.0000 g | Freq: Once | INTRAVENOUS | Status: AC
  Administered 2012-12-20: 1 g via INTRAVENOUS
  Filled 2012-12-20: qty 10

## 2012-12-20 MED ORDER — CALCIUM ACETATE 667 MG PO CAPS
1334.0000 mg | ORAL_CAPSULE | Freq: Three times a day (TID) | ORAL | Status: DC
  Administered 2012-12-20 – 2012-12-21 (×3): 1334 mg via ORAL
  Filled 2012-12-20 (×10): qty 2

## 2012-12-20 MED ORDER — AMLODIPINE BESYLATE 10 MG PO TABS
10.0000 mg | ORAL_TABLET | Freq: Every day | ORAL | Status: DC
  Administered 2012-12-20 – 2012-12-21 (×2): 10 mg via ORAL
  Filled 2012-12-20 (×3): qty 1

## 2012-12-20 MED ORDER — CLONIDINE HCL 0.2 MG PO TABS
0.2000 mg | ORAL_TABLET | Freq: Once | ORAL | Status: AC
  Administered 2012-12-20: 0.2 mg via ORAL
  Filled 2012-12-20: qty 1

## 2012-12-20 MED ORDER — SODIUM CHLORIDE 0.9 % IV BOLUS (SEPSIS)
500.0000 mL | Freq: Once | INTRAVENOUS | Status: AC
  Administered 2012-12-20: 500 mL via INTRAVENOUS

## 2012-12-20 MED ORDER — ONDANSETRON HCL 4 MG PO TABS
4.0000 mg | ORAL_TABLET | Freq: Four times a day (QID) | ORAL | Status: DC | PRN
  Administered 2012-12-20: 4 mg via ORAL
  Filled 2012-12-20: qty 1

## 2012-12-20 MED ORDER — INSULIN ASPART 100 UNIT/ML ~~LOC~~ SOLN
0.0000 [IU] | SUBCUTANEOUS | Status: DC
  Administered 2012-12-20: 1 [IU] via SUBCUTANEOUS
  Administered 2012-12-22: 2 [IU] via SUBCUTANEOUS

## 2012-12-20 MED ORDER — PROMETHAZINE HCL 25 MG/ML IJ SOLN
12.5000 mg | Freq: Four times a day (QID) | INTRAMUSCULAR | Status: DC | PRN
  Administered 2012-12-20: 12.5 mg via INTRAVENOUS
  Filled 2012-12-20: qty 1

## 2012-12-20 MED ORDER — RENA-VITE PO TABS
1.0000 | ORAL_TABLET | Freq: Every day | ORAL | Status: DC
  Administered 2012-12-20 – 2012-12-22 (×3): 1 via ORAL
  Filled 2012-12-20 (×3): qty 1

## 2012-12-20 MED ORDER — ONDANSETRON HCL 4 MG/2ML IJ SOLN
4.0000 mg | Freq: Four times a day (QID) | INTRAMUSCULAR | Status: DC | PRN
  Administered 2012-12-21: 4 mg via INTRAVENOUS
  Filled 2012-12-20: qty 2

## 2012-12-20 MED ORDER — AMLODIPINE BESYLATE 10 MG PO TABS
10.0000 mg | ORAL_TABLET | Freq: Once | ORAL | Status: AC
  Administered 2012-12-20: 10 mg via ORAL
  Filled 2012-12-20: qty 1

## 2012-12-20 NOTE — Progress Notes (Signed)
Patient seen examined, admitted by Dr. Arnoldo Morale this morning. Briefly 42 year old female with ESRD on HD MWF, labile malignant hypertension, diabetes presented with nausea and vomiting, missed hemodialysis on Friday and Saturday   BP 203/76  Pulse 98  Temp(Src) 99.1 F (37.3 C) (Oral)  Resp 14  SpO2 100%  LMP 12/11/2012 - Feeling better now, advanced diet to full liquids - Renal notified. Needs HD today.  - BP very uncontrolled, hopefully will improve with hemodialysis. Will give one dose of IV labetalol 10 mg.  - likely DC home tomorrow am, if tolerating diet and no issues  Amandeep Nesmith M.D. [facility] 12/20/2012, 10:26 AM  Pager: [phone]

## 2012-12-20 NOTE — Progress Notes (Signed)
Pt admitted placed on tele. Pt comes from home AOX4, ambulatory with generalized Weakness. Pt oriented to unit, staff, and safety measures. Will continue to monitor.

## 2012-12-20 NOTE — Consult Note (Signed)
[facility] Renal Consultation Note  Indication for Consultation:  Management of ESRD/hemodialysis; anemia, hypertension/volume and secondary hyperparathyroidism  HPI: [name] is a 42 y.o. female admitted with recurrent Nausea/vomiting since Monday 7/02 afternoon. She had her HD Monday am and was given her second series of Hepatitis Vaccination injections and later in afternoon GI symptoms started. She denies fevers, chills, sob,rash, chest,normal bms, deniesdysuria,and had no problems with initial Hepatitis injection.She missed her Friday HD [place] because of her gi symptoms and pain from steal type symptoms with right hand pain. She is being  followed by Dr. Lucky Cowboy with  [facility] for steal type problems with her right upper arm avf with right index finger ulcer/?ischemic. She reports a Tues, June 8th ov appt.  with him to discuss revision of avf. Just finished some liquid breakfast and not ready for solids yet .? UTI with ER UA results CS pending. Denies sob and states" was about >0.5kg below her edw wed at op hd secondary to poor appetite."      Past Medical History  Diagnosis Date  . Retinopathy   . Metabolic bone disease   . Anemia   . CHF (congestive heart failure)   . Pneumonia   . Blood transfusion   . Chronic kidney disease     M-W-F  . Hypertension     sees Dr. Carolin Guernsey  . Diabetes mellitus     "controlled with diet"  . Hemodialysis patient     Past Surgical History  Procedure Laterality Date  . Insertion of dialysis catheter  10/04/2011    Procedure: INSERTION OF DIALYSIS CATHETER;  Surgeon: Angelia Mould, MD;  Location: [place];  Service: Vascular;  Laterality: Right;  insertion of dialysis catheter right internal jugular  . Av fistula placement  10/04/2011    Procedure: INSERTION OF ARTERIOVENOUS (AV) GORE-TEX GRAFT ARM;  Surgeon: Angelia Mould, MD;  Location: [place];  Service: Vascular;  Laterality: Left;  Insertion left  upper arm Arteriovenous goretex graft  . Avgg removal  10/04/2011    Procedure: REMOVAL OF ARTERIOVENOUS GORETEX GRAFT ([place]);  Surgeon: Elam Dutch, MD;  Location: [place];  Service: Vascular;  Laterality: Left;  . Av fistula placement  10/29/2011    Procedure: ARTERIOVENOUS (AV) FISTULA CREATION;  Surgeon: Angelia Mould, MD;  Location: [facility] OR;  Service: Vascular;  Laterality: Right;  Creation Right Arteriovenous Fistula   . Revison of arteriovenous fistula  06/23/2012    Procedure: REVISON OF ARTERIOVENOUS FISTULA;  Surgeon: Angelia Mould, MD;  Location: [place];  Service: Vascular;  Laterality: Right;  Ultrasound guided  . Insertion of dialysis catheter  06/23/2012    Procedure: INSERTION OF DIALYSIS CATHETER;  Surgeon: Angelia Mould, MD;  Location: [place];  Service: Vascular;  Laterality: N/A;  Ultrasound guided  . Patch angioplasty  06/23/2012    Procedure: PATCH ANGIOPLASTY;  Surgeon: Angelia Mould, MD;  Location: [facility];  Service: Vascular;  Laterality: Right;      Family History  Problem Relation Age of Onset  . Malignant hyperthermia Mother   . Malignant hyperthermia Father   . Anesthesia problems Neg Hx   Social= Lives with parents, single,no children, highschool grad. To start Classes this August at [employer] and    reports that she has never smoked. She has never used smokeless tobacco. She reports that she does not drink alcohol or use illicit drugs.  No Known Allergies  Prior to Admission medications  Medication Sig Start Date End Date Taking? Authorizing Provider  amLODipine (NORVASC) 10 MG tablet Take 10 mg by mouth at bedtime.  11/14/11  Yes Historical Provider, MD  B Complex-C-Folic Acid (DIALYVITE Q000111Q PO) Take 1 tablet by mouth at bedtime.    Yes Historical Provider, MD  calcium acetate (PHOSLO) 667 MG capsule Take 1,334 mg by mouth 3 (three) times daily with meals.   Yes Historical Provider, MD  cloNIDine (CATAPRES) 0.3 MG tablet Take 0.3-0.6 mg by  mouth 2 (two) times daily. Take one tablet in the morning. Take two at bedtime.   Yes Historical Provider, MD  lisinopril (PRINIVIL,ZESTRIL) 20 MG tablet Take 40 mg by mouth at bedtime.    Yes Historical Provider, MD  oxyCODONE-acetaminophen (PERCOCET/ROXICET) 5-325 MG per tablet Take 1 tablet by mouth every 4 (four) hours as needed for pain. 12/18/12  Yes Shari A Upstill, PA-C  pravastatin (PRAVACHOL) 20 MG tablet Take 20 mg by mouth at bedtime.  09/11/12  Yes Historical Provider, MD    [id number], acetaminophen, alum & mag hydroxide-simeth, hydrALAZINE, HYDROmorphone (DILAUDID) injection, ondansetron (ZOFRAN) IV, ondansetron (ZOFRAN) IV, ondansetron, oxyCODONE-acetaminophen  Results for orders placed during the hospital encounter of 12/19/12 (from the past 48 hour(s))  CBC WITH DIFFERENTIAL     Status: Abnormal   Collection Time    12/19/12  9:30 PM      Result Value Range   WBC 10.6 (*) 4.0 - 10.5 K/uL   RBC 4.66  3.87 - 5.11 MIL/uL   Hemoglobin 13.3  12.0 - 15.0 g/dL   HCT 40.5  36.0 - 46.0 %   MCV 86.9  78.0 - 100.0 fL   MCH 28.5  26.0 - 34.0 pg   MCHC 32.8  30.0 - 36.0 g/dL   RDW 13.9  11.5 - 15.5 %   Platelets 369  150 - 400 K/uL   Neutrophils Relative % 68  43 - 77 %   Neutro Abs 7.2  1.7 - 7.7 K/uL   Lymphocytes Relative 23  12 - 46 %   Lymphs Abs 2.4  0.7 - 4.0 K/uL   Monocytes Relative 5  3 - 12 %   Monocytes Absolute 0.6  0.1 - 1.0 K/uL   Eosinophils Relative 3  0 - 5 %   Eosinophils Absolute 0.4  0.0 - 0.7 K/uL   Basophils Relative 1  0 - 1 %   Basophils Absolute 0.1  0.0 - 0.1 K/uL  COMPREHENSIVE METABOLIC PANEL     Status: Abnormal   Collection Time    12/19/12  9:30 PM      Result Value Range   Sodium 136  135 - 145 mEq/L   Potassium 3.9  3.5 - 5.1 mEq/L   Chloride 86 (*) 96 - 112 mEq/L   CO2 23  19 - 32 mEq/L   Glucose, Bld 73  70 - 99 mg/dL   BUN 50 (*) 6 - 23 mg/dL   Creatinine, Ser 8.52 (*) 0.50 - 1.10 mg/dL   Calcium 10.7 (*) 8.4 - 10.5 mg/dL    Total Protein 8.3  6.0 - 8.3 g/dL   Albumin 4.0  3.5 - 5.2 g/dL   AST 17  0 - 37 U/L   ALT 16  0 - 35 U/L   Alkaline Phosphatase 70  39 - 117 U/L   Total Bilirubin 0.1 (*) 0.3 - 1.2 mg/dL   GFR calc non Af Amer 5 (*) >90 mL/min   GFR calc Af Amer 6 (*) >  90 mL/min   Comment:            The eGFR has been calculated     using the CKD EPI equation.     This calculation has not been     validated in all clinical     situations.     eGFR's persistently     <90 mL/min signify     possible Chronic Kidney Disease.  TROPONIN I     Status: None   Collection Time    12/19/12 11:20 PM      Result Value Range   Troponin I <0.30  <0.30 ng/mL   Comment:            Due to the release kinetics of cTnI,     a negative result within the first hours     of the onset of symptoms does not rule out     myocardial infarction with certainty.     If myocardial infarction is still suspected,     repeat the test at appropriate intervals.  URINALYSIS, ROUTINE W REFLEX MICROSCOPIC     Status: Abnormal   Collection Time    12/20/12  2:09 AM      Result Value Range   Color, Urine YELLOW  YELLOW   APPearance CLOUDY (*) CLEAR   Specific Gravity, Urine 1.019  1.005 - 1.030   pH 5.5  5.0 - 8.0   Glucose, UA NEGATIVE  NEGATIVE mg/dL   Hgb urine dipstick MODERATE (*) NEGATIVE   Bilirubin Urine SMALL (*) NEGATIVE   Ketones, ur 40 (*) NEGATIVE mg/dL   Protein, ur >300 (*) NEGATIVE mg/dL   Urobilinogen, UA 0.2  0.0 - 1.0 mg/dL   Nitrite NEGATIVE  NEGATIVE   Leukocytes, UA SMALL (*) NEGATIVE  URINE MICROSCOPIC-ADD ON     Status: Abnormal   Collection Time    12/20/12  2:09 AM      Result Value Range   Squamous Epithelial / LPF MANY (*) RARE   WBC, UA 11-20  <3 WBC/hpf   RBC / HPF 7-10  <3 RBC/hpf   Bacteria, UA MANY (*) RARE   Casts HYALINE CASTS (*) NEGATIVE  BASIC METABOLIC PANEL     Status: Abnormal   Collection Time    12/20/12  5:50 AM      Result Value Range   Sodium 132 (*) 135 - 145 mEq/L    Potassium 4.0  3.5 - 5.1 mEq/L   Chloride 89 (*) 96 - 112 mEq/L   CO2 23  19 - 32 mEq/L   Glucose, Bld 204 (*) 70 - 99 mg/dL   BUN 49 (*) 6 - 23 mg/dL   Creatinine, Ser 8.72 (*) 0.50 - 1.10 mg/dL   Calcium 9.3  8.4 - 10.5 mg/dL   GFR calc non Af Amer 5 (*) >90 mL/min   GFR calc Af Amer 6 (*) >90 mL/min   Comment:            The eGFR has been calculated     using the CKD EPI equation.     This calculation has not been     validated in all clinical     situations.     eGFR's persistently     <90 mL/min signify     possible Chronic Kidney Disease.  CBC     Status: Abnormal   Collection Time    12/20/12  5:50 AM      Result Value Range   WBC  10.6 (*) 4.0 - 10.5 K/uL   RBC 3.98  3.87 - 5.11 MIL/uL   Hemoglobin 11.5 (*) 12.0 - 15.0 g/dL   HCT 33.8 (*) 36.0 - 46.0 %   MCV 84.9  78.0 - 100.0 fL   MCH 28.9  26.0 - 34.0 pg   MCHC 34.0  30.0 - 36.0 g/dL   RDW 13.4  11.5 - 15.5 %   Platelets 310  150 - 400 K/uL  MRSA PCR SCREENING     Status: None   Collection Time    12/20/12  6:27 AM      Result Value Range   MRSA by PCR NEGATIVE  NEGATIVE   Comment:            The GeneXpert MRSA Assay (FDA     approved for NASAL specimens     only), is one component of a     comprehensive MRSA colonization     surveillance program. It is not     intended to diagnose MRSA     infection nor to guide or     monitor treatment for     MRSA infections.  GLUCOSE, CAPILLARY     Status: Abnormal   Collection Time    12/20/12  7:31 AM      Result Value Range   Glucose-Capillary 139 (*) 70 - 99 mg/dL  GLUCOSE, CAPILLARY     Status: None   Collection Time    12/20/12 10:55 AM      Result Value Range   Glucose-Capillary 96  70 - 99 mg/dL     ROS: See positives in HPI  Physical Exam: Filed Vitals:   12/20/12 1118  BP: 153/75  Pulse: 100  Temp:   Resp:      General: Alert, thin Young BF NAD, Pleasant, appropriate HEENT: , MMM Eyes: eomi Neck: supple, mild jvd Heart: RRR, no rub or  nur, Lungs: CTA Abdomen: BS pos. , soft, nontender Extremities: No pedal edema, 3rd finger dip joint ulcer Skin: no overt rash. Right hand slightly cooler than left R third finger darkish skin discoloration, mid finger skin is ulcerated about 50% of circumference, no odor or drainage Neuro: oX3, no acute deficits R hand is weak to grip Dialysis Access: Pos. Bruit Right upper arm avf  Dialysis Orders: Center: [facility]  on MWF .     check with op center in am for info ( Sunday and NOT ABLE TO ACCESS OP CENTER WEB SITE) EDW 55.5 HD Bath /  Time / Heparin /. Access / BFR / DFR /    Zemplar / mcg IV/HD Epogen /   Units IV/HD  Venofer  /  Other /  Assessment/Plan  1.  Nausea/ Vomiting/ - ? DM Gastropareis.? Viral GI/ ?UTI= admit team wu 2.ESRD -  MWF HD schedule ( missed Friday HD) Labs okay and vol. Okay for HD Monday AM 3.Steal Syndrome Right arm avf- attempt hd in am bfr 300 to 400 as tolerates/ needs fu with Dr. Berdine Addison VAS  She has an ulcerating and what appears to be ischemic R third finger, this has been a problem for some time and she prefers to f/u with her IT trainer in [place] for this, I offered local vasc surgeon here to look at it but she declined 3.Hypertension/volume  - Meds and prn for bp controll/ no uf Below edw with vomiting 4.Anemia  - HGB 13.3 no epo / fu am labs ? Hemo concentr. With vomiting 5.Metabolic  bone disease -  Check with op center in am For vit d. Binders/ phoslo 6.Nutrition - renal vit. Neysa Hotter carb. Mod diet 7.IDDM type 2 =per admit  Ernest Haber, PA-C [place] [phone] 12/20/2012, 12:12 PM   Patient seen and examined.  I agree with plan as above with additions as indicated. Kelly Splinter  MD Pager [phone]    Cell  [phone] 12/20/2012, 8:15 PM

## 2012-12-20 NOTE — H&P (Signed)
[facility] History and Physical  [name] [id number] DOB: 1970-09-04 DOA: 12/19/2012  Referring physician:  EDP PCP: Benito Mccreedy, MD  Specialists:   Chief Complaint:  Nausea and Vomting  HPI: [name] is a 42 y.o. female with ESRD on HD, Malignant HTN, and DM2 who presents to ED with complaints of severe nausea and vomiting X 5 days.  She denies having any fevers or chills or chest pain or SOB.   She denies having any hematemesis, or diarrhea.   She reports missing dialysis on Friday due to her illness.   She also reports having a Hepatitis A vaccination the day before her symptoms began.       Review of Systems: The patient denies anorexia, fever, chills, headaches, weight loss,, vision loss, diplopia, dizziness, decreased hearing, rhinitis, hoarseness, chest pain, syncope, dyspnea on exertion, peripheral edema, balance deficits, cough, hemoptysis, abdominal pain, diarrhea, constipation, hematemesis, melena, hematochezia, severe indigestion/heartburn, dysuria, hematuria, incontinence, muscle weakness, suspicious skin lesions, transient blindness, difficulty walking, depression, unusual weight change, abnormal bleeding, enlarged lymph nodes, angioedema, and breast masses.    Past Medical History  Diagnosis Date  . Retinopathy   . Metabolic bone disease   . Anemia   . CHF (congestive heart failure)   . Pneumonia   . Blood transfusion   . Chronic kidney disease     M-W-F  . Hypertension     sees Dr. Carolin Guernsey  . Diabetes mellitus     "controlled with diet"  . Hemodialysis patient     Past Surgical History  Procedure Laterality Date  . Insertion of dialysis catheter  10/04/2011    Procedure: INSERTION OF DIALYSIS CATHETER;  Surgeon: Angelia Mould, MD;  Location: [place];  Service: Vascular;  Laterality: Right;  insertion of dialysis catheter right internal jugular  . Av fistula placement  10/04/2011    Procedure: INSERTION OF ARTERIOVENOUS (AV)  GORE-TEX GRAFT ARM;  Surgeon: Angelia Mould, MD;  Location: [place];  Service: Vascular;  Laterality: Left;  Insertion left upper arm Arteriovenous goretex graft  . Avgg removal  10/04/2011    Procedure: REMOVAL OF ARTERIOVENOUS GORETEX GRAFT ([place]);  Surgeon: Elam Dutch, MD;  Location: [place];  Service: Vascular;  Laterality: Left;  . Av fistula placement  10/29/2011    Procedure: ARTERIOVENOUS (AV) FISTULA CREATION;  Surgeon: Angelia Mould, MD;  Location: [facility] OR;  Service: Vascular;  Laterality: Right;  Creation Right Arteriovenous Fistula   . Revison of arteriovenous fistula  06/23/2012    Procedure: REVISON OF ARTERIOVENOUS FISTULA;  Surgeon: Angelia Mould, MD;  Location: [place];  Service: Vascular;  Laterality: Right;  Ultrasound guided  . Insertion of dialysis catheter  06/23/2012    Procedure: INSERTION OF DIALYSIS CATHETER;  Surgeon: Angelia Mould, MD;  Location: [place];  Service: Vascular;  Laterality: N/A;  Ultrasound guided  . Patch angioplasty  06/23/2012    Procedure: PATCH ANGIOPLASTY;  Surgeon: Angelia Mould, MD;  Location: [place];  Service: Vascular;  Laterality: Right;    Prior to Admission medications   Medication Sig Start Date End Date Taking? Authorizing Provider  amLODipine (NORVASC) 10 MG tablet Take 10 mg by mouth at bedtime.  11/14/11  Yes Historical Provider, MD  B Complex-C-Folic Acid (DIALYVITE Q000111Q PO) Take 1 tablet by mouth at bedtime.    Yes Historical Provider, MD  calcium acetate (PHOSLO) 667 MG capsule Take 1,334 mg by mouth 3 (three) times daily with meals.  Yes Historical Provider, MD  cloNIDine (CATAPRES) 0.3 MG tablet Take 0.3-0.6 mg by mouth 2 (two) times daily. Take one tablet in the morning. Take two at bedtime.   Yes Historical Provider, MD  lisinopril (PRINIVIL,ZESTRIL) 20 MG tablet Take 40 mg by mouth at bedtime.    Yes Historical Provider, MD  oxyCODONE-acetaminophen (PERCOCET/ROXICET) 5-325 MG per tablet Take 1 tablet by  mouth every 4 (four) hours as needed for pain. 12/18/12  Yes Shari A Upstill, PA-C  pravastatin (PRAVACHOL) 20 MG tablet Take 20 mg by mouth at bedtime.  09/11/12  Yes Historical Provider, MD    No Known Allergies  Social History:  reports that she has never smoked. She has never used smokeless tobacco. She reports that she does not drink alcohol or use illicit drugs.     Family History  Problem Relation Age of Onset  . Malignant hyperthermia Mother   . Malignant hyperthermia Father   . Anesthesia problems Neg Hx    CAD in Father  Diabetes- Brother  CHF- Brother  HTN in Both Parents  Breast Cancer  In Paternal Aunt  Breast Cancer  In Maternal Aunt  ESRD in Maternal Uncle    Physical Exam:  GEN:  Pleasant Obese  42 y.o. African American female  examined  and in no acute distress; cooperative with exam Filed Vitals:   12/20/12 0330 12/20/12 0345 12/20/12 0400 12/20/12 0445  BP: 224/81 213/76 203/77 189/76  Pulse: 116 113 111 105  Temp:      TempSrc:      Resp: 13 14 14 13   SpO2: 98% 99% 99% 98%   Blood pressure 189/76, pulse 105, temperature 98.5 F (36.9 C), temperature source Oral, resp. rate 13, last menstrual period 12/11/2012, SpO2 98.00%. PSYCH: She is alert and oriented x4; does not appear anxious does not appear depressed; affect is normal HEENT: Normocephalic and Atraumatic, Mucous membranes pink; PERRLA; EOM intact; Fundi:  Benign;  No scleral icterus, Nares: Patent, Oropharynx: Clear, Fair Dentition, Neck:  FROM, no cervical lymphadenopathy nor thyromegaly or carotid bruit; no JVD; Breasts:: Not examined CHEST WALL: No tenderness CHEST: Normal respiration, clear to auscultation bilaterally HEART: Regular rate and rhythm; no murmurs rubs or gallops BACK: No kyphosis or scoliosis; no CVA tenderness ABDOMEN: Positive Bowel Sounds, Obese, soft non-tender; no masses, no organomegaly. Rectal Exam: Not done EXTREMITIES: No cyanosis, clubbing or edema; no  ulcerations. Genitalia: not examined PULSES: 2+ and symmetric SKIN: Normal hydration no rash or ulceration CNS: Cranial nerves 2-12 grossly intact no focal neurologic deficit    Labs on Admission:  Basic Metabolic Panel:  Recent Labs Lab 12/19/12 2130  NA 136  K 3.9  CL 86*  CO2 23  GLUCOSE 73  BUN 50*  CREATININE 8.52*  CALCIUM 10.7*   Liver Function Tests:  Recent Labs Lab 12/19/12 2130  AST 17  ALT 16  ALKPHOS 70  BILITOT 0.1*  PROT 8.3  ALBUMIN 4.0   No results found for this basename: LIPASE, AMYLASE,  in the last 168 hours No results found for this basename: AMMONIA,  in the last 168 hours CBC:  Recent Labs Lab 12/19/12 2130  WBC 10.6*  NEUTROABS 7.2  HGB 13.3  HCT 40.5  MCV 86.9  PLT 369   Cardiac Enzymes:  Recent Labs Lab 12/19/12 2320  TROPONINI <0.30    BNP (last 3 results) No results found for this basename: PROBNP,  in the last 8760 hours CBG: No results found for this basename: GLUCAP,  in the last 168 hours  Radiological Exams on Admission: Dg Chest 2 View  12/20/2012   *RADIOLOGY REPORT*  Clinical Data: Shortness of breath.  Nausea and vomiting since Tuesday.  Hepatitis that seen on Monday.  CHEST - 2 VIEW  Comparison: 06/23/2012  Findings: Interval removal of central venous catheter. The heart size and pulmonary vascularity are normal. The lungs appear clear and expanded without focal air space disease or consolidation. No blunting of the costophrenic angles.  No pneumothorax.  Mediastinal contours appear intact.  IMPRESSION: No evidence of active pulmonary disease.   Original Report Authenticated By: Lucienne Capers, M.D.     EKG: Independently reviewed.    Normal Sinus Rhythm   No Acute S-T changes    Assessment/Plan Principal Problem:   Nausea and vomiting Active Problems:   DIABETES MELLITUS II, UNCOMPLICATED   HYPERLIPIDEMIA   HTN (hypertension), malignant   End stage renal disease   1.  Nausea and Vomiting- Viral  Illness versus Gastroenteritis versus Gastroparesis,  Medicate PRN with IV Anti-Emetics.     2.  Diabetes Mellitus Type II-   Monitor Glucose levels q 4 hours with SSI coverage PRN, check HbA1C.      3.  Hyperlipidemia- On Pravastatin.      4.  HTN- continue Clonidine, Amlodipine, and Lisinopril.    IV hydralazine PRN SBP > 160.  5.  ESRD- Notify Renal and dialysis team to continue dialysis schedule.        Code Status:   FULL CODE Family Communication:    No Family Present Disposition Plan:    Return to Home on discharge  Time spent:  [place] [facility] Pager [phone]  If 7PM-7AM, please contact night-coverage [email or web address] Password [email or web address] 12/20/2012, 5:20 AM

## 2012-12-21 ENCOUNTER — Observation Stay (HOSPITAL_COMMUNITY): Payer: Medicare Other

## 2012-12-21 DIAGNOSIS — N186 End stage renal disease: Secondary | ICD-10-CM

## 2012-12-21 LAB — BASIC METABOLIC PANEL
BUN: 52 mg/dL — ABNORMAL HIGH (ref 6–23)
CO2: 26 mEq/L (ref 19–32)
Calcium: 9.6 mg/dL (ref 8.4–10.5)
Chloride: 90 mEq/L — ABNORMAL LOW (ref 96–112)
Creatinine, Ser: 9.66 mg/dL — ABNORMAL HIGH (ref 0.50–1.10)
GFR calc Af Amer: 5 mL/min — ABNORMAL LOW (ref >90)
GFR calc non Af Amer: 4 mL/min — ABNORMAL LOW (ref >90)
Glucose, Bld: 102 mg/dL — ABNORMAL HIGH (ref 70–99)
Potassium: 3.9 mEq/L (ref 3.5–5.1)
Sodium: 131 mEq/L — ABNORMAL LOW (ref 135–145)

## 2012-12-21 LAB — RENAL FUNCTION PANEL
Albumin: 3.4 g/dL — ABNORMAL LOW (ref 3.5–5.2)
BUN: 52 mg/dL — ABNORMAL HIGH (ref 6–23)
CO2: 22 mEq/L (ref 19–32)
Calcium: 9.8 mg/dL (ref 8.4–10.5)
Chloride: 87 mEq/L — ABNORMAL LOW (ref 96–112)
Creatinine, Ser: 10.6 mg/dL — ABNORMAL HIGH (ref 0.50–1.10)
GFR calc Af Amer: 5 mL/min — ABNORMAL LOW (ref >90)
GFR calc non Af Amer: 4 mL/min — ABNORMAL LOW (ref >90)
Glucose, Bld: 110 mg/dL — ABNORMAL HIGH (ref 70–99)
Phosphorus: 8.4 mg/dL — ABNORMAL HIGH (ref 2.3–4.6)
Potassium: 4.1 mEq/L (ref 3.5–5.1)
Sodium: 133 mEq/L — ABNORMAL LOW (ref 135–145)

## 2012-12-21 LAB — CBC
HCT: 35 % — ABNORMAL LOW (ref 36.0–46.0)
Hemoglobin: 11.9 g/dL — ABNORMAL LOW (ref 12.0–15.0)
MCH: 28.7 pg (ref 26.0–34.0)
MCHC: 34 g/dL (ref 30.0–36.0)
MCV: 84.3 fL (ref 78.0–100.0)
Platelets: 311 10*3/uL (ref 150–400)
RBC: 4.15 MIL/uL (ref 3.87–5.11)
RDW: 13.6 % (ref 11.5–15.5)
WBC: 12.2 10*3/uL — ABNORMAL HIGH (ref 4.0–10.5)

## 2012-12-21 LAB — URINE CULTURE: Colony Count: 45000

## 2012-12-21 LAB — GLUCOSE, CAPILLARY
Glucose-Capillary: 87 mg/dL (ref 70–99)
Glucose-Capillary: 106 mg/dL — ABNORMAL HIGH (ref 70–99)
Glucose-Capillary: 124 mg/dL — ABNORMAL HIGH (ref 70–99)
Glucose-Capillary: 73 mg/dL (ref 70–99)
Glucose-Capillary: 168 mg/dL — ABNORMAL HIGH (ref 70–99)

## 2012-12-21 LAB — LIPASE, BLOOD: Lipase: 31 U/L (ref 11–59)

## 2012-12-21 LAB — TROPONIN I: Troponin I: <0.3 ng/mL (ref <0.30)

## 2012-12-21 LAB — HEPATITIS B SURFACE ANTIGEN: Hepatitis B Surface Ag: NEGATIVE

## 2012-12-21 MED ORDER — OXYCODONE-ACETAMINOPHEN 5-325 MG PO TABS
1.0000 | ORAL_TABLET | ORAL | Status: DC | PRN
  Administered 2012-12-21 – 2012-12-22 (×2): 2 via ORAL
  Filled 2012-12-21 (×2): qty 2

## 2012-12-21 MED ORDER — LISINOPRIL 40 MG PO TABS
40.0000 mg | ORAL_TABLET | Freq: Every day | ORAL | Status: DC
  Administered 2012-12-21 – 2012-12-22 (×2): 40 mg via ORAL
  Filled 2012-12-21 (×2): qty 1

## 2012-12-21 MED ORDER — PIPERACILLIN-TAZOBACTAM IN DEX 2-0.25 GM/50ML IV SOLN
2.2500 g | Freq: Three times a day (TID) | INTRAVENOUS | Status: DC
  Administered 2012-12-21 – 2012-12-22 (×3): 2.25 g via INTRAVENOUS
  Filled 2012-12-21 (×5): qty 50

## 2012-12-21 MED ORDER — DOXERCALCIFEROL 4 MCG/2ML IV SOLN
1.0000 ug | INTRAVENOUS | Status: DC
  Administered 2012-12-21: 1 ug via INTRAVENOUS

## 2012-12-21 NOTE — Consult Note (Signed)
[facility] [place] CONSULT NOTE 12/21/2012 DOB: [id number] MRN : [id number]  [id number] 3erd digit dorsum finger ulcer. Referring Physician:Dr. Florene Glen  History of Present Illness: This is a 42 y.o. Female that was brought to the hospital secondary to dehydration.  She received a Hepatitis vaccination and had been vomiting for 5 days.  She has a history of ESRD,and has been on HD for 2 years.  She was last seen by Dr. Scot Dock in Feb. 2014 s/p ligation of competing branches right AV fistula.  She got a paper cut on the dorsum of the right 3erd finger that has progressed into a non healing ulcer.  Medical history includes HTN, CHF, and DM.  She is followed by Dr. Lucky Cowboy in [place] and he was planning  Vein mapping and a procedure on the right AV fistula tomorrow prior to her being admitted into the hospital.  Past Medical History  Diagnosis Date  . Retinopathy   . Metabolic bone disease   . Anemia   . CHF (congestive heart failure)   . Pneumonia   . Blood transfusion   . Chronic kidney disease     M-W-F  . Hypertension     sees Dr. Carolin Guernsey  . Diabetes mellitus     "controlled with diet"  . Hemodialysis patient     Past Surgical History  Procedure Laterality Date  . Insertion of dialysis catheter  10/04/2011    Procedure: INSERTION OF DIALYSIS CATHETER;  Surgeon: Angelia Mould, MD;  Location: [place];  Service: Vascular;  Laterality: Right;  insertion of dialysis catheter right internal jugular  . Av fistula placement  10/04/2011    Procedure: INSERTION OF ARTERIOVENOUS (AV) GORE-TEX GRAFT ARM;  Surgeon: Angelia Mould, MD;  Location: [place];  Service: Vascular;  Laterality: Left;  Insertion left upper arm Arteriovenous goretex graft  . Avgg removal  10/04/2011    Procedure: REMOVAL OF ARTERIOVENOUS GORETEX GRAFT ([place]);  Surgeon: Elam Dutch, MD;  Location: [place];  Service: Vascular;  Laterality: Left;  . Av fistula placement  10/29/2011     Procedure: ARTERIOVENOUS (AV) FISTULA CREATION;  Surgeon: Angelia Mould, MD;  Location: [facility];  Service: Vascular;  Laterality: Right;  Creation Right Arteriovenous Fistula   . Revison of arteriovenous fistula  06/23/2012    Procedure: REVISON OF ARTERIOVENOUS FISTULA;  Surgeon: Angelia Mould, MD;  Location: [place];  Service: Vascular;  Laterality: Right;  Ultrasound guided  . Insertion of dialysis catheter  06/23/2012    Procedure: INSERTION OF DIALYSIS CATHETER;  Surgeon: Angelia Mould, MD;  Location: [place];  Service: Vascular;  Laterality: N/A;  Ultrasound guided  . Patch angioplasty  06/23/2012    Procedure: PATCH ANGIOPLASTY;  Surgeon: Angelia Mould, MD;  Location: [facility] OR;  Service: Vascular;  Laterality: Right;     ROS: [x]  Positive  [ ]  Denies    General: [x ] Weight loss, [ ]  Fever, [ ]  chills Neurologic: [ ]  Dizziness, [ ]  Blackouts, [ ]  Seizure [ ]  Stroke, [ ]  "Mini stroke", [ ]  Slurred speech, [ ]  Temporary blindness; [ ]  weakness in arms or legs, [ ]  Hoarseness Cardiac: [ ]  Chest pain/pressure, [ ]  Shortness of breath at rest [ ]  Shortness of breath with exertion, [ ]  Atrial fibrillation or irregular heartbeat Vascular: [ ]  Pain in legs with walking, [ ]  Pain in legs at rest, [ ]  Pain in legs at night,  [x ] Non-healing ulcer, [ ]   Blood clot in vein/DVT,   Pulmonary: [ ]  Home oxygen, [ ]  Productive cough, [ ]  Coughing up blood, [ ]  Asthma,  [ ]  Wheezing Musculoskeletal:  [ ]  Arthritis, [ ]  Low back pain, [ ]  Joint pain Hematologic: [ ]  Easy Bruising, [ ]  Anemia; [ ]  Hepatitis Gastrointestinal: [ ]  Blood in stool, [ ]  Gastroesophageal Reflux/heartburn, [ ]  Trouble swallowing [x]  vomiting/nausea Urinary: [ ]  chronic Kidney disease, [ ]  on HD - [ ]  MWF or [ ]  TTHS, [ ]  Burning with urination, [ ]  Difficulty urinating Skin: [ ]  Rashes, [x ] Wounds Psychological: [ ]  Anxiety, [ ]  Depression  Social History History  Substance Use Topics  . Smoking status:  Never Smoker   . Smokeless tobacco: Never Used  . Alcohol Use: No    Family History Family History  Problem Relation Age of Onset  . Malignant hyperthermia Mother   . Malignant hyperthermia Father   . Anesthesia problems Neg Hx     No Known Allergies  Current Facility-Administered Medications  Medication Dose Route Frequency Provider Last Rate Last Dose  . acetaminophen (TYLENOL) tablet 650 mg  650 mg Oral Q6H PRN Theressa Millard, MD       Or  . acetaminophen (TYLENOL) suppository 650 mg  650 mg Rectal Q6H PRN Theressa Millard, MD      . alum & mag hydroxide-simeth (MAALOX/MYLANTA) 200-200-20 MG/5ML suspension 30 mL  30 mL Oral Q6H PRN Theressa Millard, MD      . amLODipine (NORVASC) tablet 10 mg  10 mg Oral QHS Theressa Millard, MD   10 mg at 12/20/12 2034  . calcium acetate (PHOSLO) capsule 1,334 mg  1,334 mg Oral TID WC Theressa Millard, MD   1,334 mg at 12/21/12 [id number]  . cloNIDine (CATAPRES) tablet 0.3 mg  0.3 mg Oral TID Theressa Millard, MD   0.3 mg at 12/20/12 2156  . doxercalciferol (HECTOROL) injection 1 mcg  1 mcg Intravenous Q M,W,F-HD Alvia Grove, PA-C      . enoxaparin (LOVENOX) injection 30 mg  30 mg Subcutaneous Q24H Theressa Millard, MD   30 mg at 12/20/12 2036  . hydrALAZINE (APRESOLINE) injection 10 mg  10 mg Intravenous Q4H PRN Theressa Millard, MD   10 mg at 12/21/12 1255  . insulin aspart (novoLOG) injection 0-9 Units  0-9 Units Subcutaneous Q4H Theressa Millard, MD   1 Units at 12/20/12 [phone]  . lisinopril (PRINIVIL,ZESTRIL) tablet 40 mg  40 mg Oral Daily Reyne Dumas, MD      . multivitamin (RENA-VIT) tablet 1 tablet  1 tablet Oral Daily Theressa Millard, MD   1 tablet at 12/20/12 0946  . ondansetron (ZOFRAN) tablet 4 mg  4 mg Oral Q6H PRN Theressa Millard, MD   4 mg at 12/20/12 0948   Or  . ondansetron (ZOFRAN) injection 4 mg  4 mg Intravenous Q6H PRN Theressa Millard, MD   4 mg at 12/21/12 1028  . oxyCODONE-acetaminophen  (PERCOCET/ROXICET) 5-325 MG per tablet 1-2 tablet  1-2 tablet Oral Q4H PRN Reyne Dumas, MD      . piperacillin-tazobactam (ZOSYN) IVPB 2.25 g  2.25 g Intravenous Q8H Patsey Berthold Shirley, RPH   2.25 g at 12/21/12 1204  . simvastatin (ZOCOR) tablet 10 mg  10 mg Oral q1800 Theressa Millard, MD   10 mg at 12/20/12 1720     Imaging: Dg Chest 2 View  12/20/2012   *RADIOLOGY REPORT*  Clinical Data: Shortness of breath.  Nausea and vomiting since Tuesday.  Hepatitis that seen on Monday.  CHEST - 2 VIEW  Comparison: 06/23/2012  Findings: Interval removal of central venous catheter. The heart size and pulmonary vascularity are normal. The lungs appear clear and expanded without focal air space disease or consolidation. No blunting of the costophrenic angles.  No pneumothorax.  Mediastinal contours appear intact.  IMPRESSION: No evidence of active pulmonary disease.   Original Report Authenticated By: Lucienne Capers, M.D.    Significant Diagnostic Studies: CBC Lab Results  Component Value Date   WBC 12.2* 12/21/2012   HGB 11.9* 12/21/2012   HCT 35.0* 12/21/2012   MCV 84.3 12/21/2012   PLT 311 12/21/2012    BMET    Component Value Date/Time   NA 133* 12/21/2012 1341   K 4.1 12/21/2012 1341   CL 87* 12/21/2012 1341   CO2 22 12/21/2012 1341   GLUCOSE 110* 12/21/2012 1341   GLUCOSE 318 03/31/2009 0815   BUN 52* 12/21/2012 1341   CREATININE 10.60* 12/21/2012 1341   CALCIUM 9.8 12/21/2012 1341   CALCIUM 8.5 04/02/2011 0514   GFRNONAA 4* 12/21/2012 1341   GFRAA 5* 12/21/2012 1341    COAG Lab Results  Component Value Date   INR 0.89 06/23/2012   No results found for this basename: PTT     Physical Examination BP Readings from Last 3 Encounters:  12/21/12 142/73  12/18/12 222/82  09/22/12 154/88   Temp Readings from Last 3 Encounters:  12/21/12 98 F (36.7 C) Oral  12/18/12 98.2 F (36.8 C) Oral  07/14/12 98.4 F (36.9 C) Oral   SpO2 Readings from Last 3 Encounters:  12/21/12 100%  12/18/12 100%   09/22/12 100%   Pulse Readings from Last 3 Encounters:  12/21/12 118  12/18/12 108  09/22/12 95    General:  WDWN in NAD HENT: WNL Eyes: Pupils equal Pulmonary: normal non-labored breathing , without Rales, rhonchi,  wheezing Cardiac: RRR, without  Murmurs, rubs or gallops; No carotid bruits Abdomen: soft, NT, no masses Skin: no rashes, ulcers on the dorsum of the 3 erd digit 1cm x 1 cm.  Vascular Exam/Pulses:palapble radial and ulnar pulses bilateral. Palpable thrill right AV fistula.  Decreased sensation right hand compared to left. ( Numbness and tingling increases while on dialysis.)  Extremities LUE and B LE without ischemic changes, no Gangrene , no cellulitis; no open wounds;  Musculoskeletal: no muscle wasting or atrophy  Neurologic: A&O X 3; Appropriate Affect ;  SENSATION: normal; MOTOR FUNCTION: Pt has good and equal strength in all extremities - 5/5 Speech is fluent/normal  Non-Invasive Vascular Imaging: None  ASSESSMENT/PLAN: Right 3erd digit ulcer AV fistula Steal syndrome.    [facility] 12/21/2012 3:32 PM  I have examined the patient, reviewed and agree with above. The patient had a steal syndrome in her left arm when she initially had a graft placed several years ago. She had immediate removal of this. She is left-handed. She had placement of a right upper arm AV fistula by Dr. Scot Dock in May of 2013 and had a revision with ligation of competing branches and angioplasty of her arterial anastomosis in January 2014. I had seen her in our office in April of 2014. At that time she had suffered a significant infiltration from the access. Her AV fistula was patent and I documented a right radial pulse at that time. The patient now reports that she had had actual paper cut on her third  finger the right hand and has had continued difficulty with healing of this. She has seen Dr.Dew in [facility] for vascular treatment. He has a scheduled for tomorrow to vein map  and schedule aDRIL procedure for correction of her steal.  On physical exam today she has an mild pain in her finger with no acute steal. She does have a 1 cm ulcer on the dorsum of her third finger. This does appear to be full-thickness. I do not palpate a radial pulse. She is currently on hemodialysis via her fistula.  Impression and plan: Chronic steal syndrome right arm with right upper arm AV fistula. The patient is left-handed. She is to see Dr. Lucky Cowboy as soon as possible for followup. She is comfortable with the his plan. I think that we would be happy to assist in any way possible. Her appointment with him tomorrow is at 3:45 PM. The plan is for her to be discharged in the morning and make her appointment. We will not follow actively.  Risa Auman, MD 12/21/2012 3:46 PM

## 2012-12-21 NOTE — Progress Notes (Signed)
[facility] Progress Note  Subjective:   No c/o's. Denies further N/V.  Objective Filed Vitals:   12/20/12 1612 12/20/12 1955 12/21/12 0417 12/21/12 0945  BP: 145/72 195/76 178/69 181/66  Pulse: 90 92 99 86  Temp: 98.6 F (37 C) 98.7 F (37.1 C) 99.4 F (37.4 C) 98 F (36.7 C)  TempSrc: Oral Oral Oral Oral  Resp: 18 18 18 18   Height:  5\' 2"  (1.575 m)    Weight:  55.963 kg (123 lb 6 oz)    SpO2: 96% 98% 97% 95%   Physical Exam General: Alert, cooperative, NAD Heart: RRR Lungs: CTA bilaterally Abdomen: soft, NT, non-distended, normal BS Extremities:  Large ulceration to R third finger, No LE edema Dialysis Access: RUA AVF + bruit  Dialysis Orders: Center: [facility] on MWF EDW 55 kg Optiflux 180 Time 3:30 / Heparin 4000 u/. Access RUA AVF / BFR 300 / DFR A 1.5/  Hectorol 1 mcg IV/HD Epogen 1000 u weekly IV/HD Venofer none / Profile #4 Tsat 37% on 6/27, PTH 170.7 in April   Assessment/Plan: 1. Nausea/ Vomiting/ - ? DM Gastropareis.? Viral GI/ On Zosyn for ? UTI per primary.  2. ESRD - MWF HD schedule ( missed Friday HD) Labs okay and vol. Okay for HD today 3. Steal Syndrome RUA AVF - attempt hd in am bfr 300 to 400 as tolerated/ Has fu with Dr. Berdine Addison [facility] on 12/22/12 in late afternoon. She has an ulcerating and what appears to be ischemic R third finger, this has been a problem for some time and she prefers to f/u with her IT trainer in [place] for this. She initially declined offer of eval by  local vasc surgeon here, but is amenable if she remains admitted and is unable to keep her appt. 4. Hypertension/volume - ^ BP. Continue home and prn meds for bp control. At op EDW.  5. Anemia - HGB 11.5 on weekly epo op. Hold ESAs for now. Last Tsat 37% on 6/27. No IV Fe for now. 6. Metabolic bone disease - Ca 9.6. Phos 5.5 op. PTH at goal in April. Continue hectorol 1 and binders 7. Nutrition - albumin 4.0. renal vit. Neysa Hotter carb. Mod diet  8. IDDM type 2 -per  admit   Collene Leyden. Rhodia Albright [facility] Pager [phone] 12/21/2012,10:30 AM  LOS: 2 days    Additional Objective Labs: Basic Metabolic Panel:  Recent Labs Lab 12/19/12 2130 12/20/12 0550 12/21/12 0500  NA 136 132* 131*  K 3.9 4.0 3.9  CL 86* 89* 90*  CO2 23 23 26   GLUCOSE 73 204* 102*  BUN 50* 49* 52*  CREATININE 8.52* 8.72* 9.66*  CALCIUM 10.7* 9.3 9.6   Liver Function Tests:  Recent Labs Lab 12/19/12 2130  AST 17  ALT 16  ALKPHOS 70  BILITOT 0.1*  PROT 8.3  ALBUMIN 4.0   No results found for this basename: LIPASE, AMYLASE,  in the last 168 hours CBC:  Recent Labs Lab 12/19/12 2130 12/20/12 0550  WBC 10.6* 10.6*  NEUTROABS 7.2  --   HGB 13.3 11.5*  HCT 40.5 33.8*  MCV 86.9 84.9  PLT 369 310   Blood Culture    Component Value Date/Time   SDES URINE, CLEAN CATCH 12/20/2012 0209   SPECREQUEST CX ADDED AT [place] ON [id number] 12/20/2012 0209   CULT Multiple bacterial morphotypes present, none predominant. Suggest appropriate recollection if clinically indicated. 12/20/2012 0209   REPTSTATUS 12/21/2012 FINAL 12/20/2012 0209  Cardiac Enzymes:  Recent Labs Lab 12/19/12 2320  TROPONINI <0.30   CBG:  Recent Labs Lab 12/20/12 1521 12/20/12 1959 12/20/12 2354 12/21/12 0420 12/21/12 0750  GLUCAP 106* 118* 106* 87 106*   Studies/Results: Dg Chest 2 View  12/20/2012   *RADIOLOGY REPORT*  Clinical Data: Shortness of breath.  Nausea and vomiting since Tuesday.  Hepatitis that seen on Monday.  CHEST - 2 VIEW  Comparison: 06/23/2012  Findings: Interval removal of central venous catheter. The heart size and pulmonary vascularity are normal. The lungs appear clear and expanded without focal air space disease or consolidation. No blunting of the costophrenic angles.  No pneumothorax.  Mediastinal contours appear intact.  IMPRESSION: No evidence of active pulmonary disease.   Original Report Authenticated By: Lucienne Capers, M.D.   Medications:   .  amLODipine  10 mg Oral QHS  . calcium acetate  1,334 mg Oral TID WC  . cloNIDine  0.3 mg Oral TID  . enoxaparin (LOVENOX) injection  30 mg Subcutaneous Q24H  . insulin aspart  0-9 Units Subcutaneous Q4H  . lisinopril  40 mg Oral Daily  . multivitamin  1 tablet Oral Daily  . piperacillin-tazobactam (ZOSYN)  IV  2.25 g Intravenous Q8H  . simvastatin  10 mg Oral q1800

## 2012-12-21 NOTE — Progress Notes (Signed)
ANTIBIOTIC CONSULT NOTE - INITIAL  Pharmacy Consult for Zosyn Indication: Possible UTI  No Known Allergies  Patient Measurements: Height: 5\' 2"  (157.5 cm) Weight: 123 lb 6 oz (55.963 kg) IBW/kg (Calculated) : 50.1 Adjusted Body Weight:   Vital Signs: Temp: 99.4 F (37.4 C) (07/07 0417) Temp src: Oral (07/07 0417) BP: 178/69 mmHg (07/07 0417) Pulse Rate: 99 (07/07 0417) Intake/Output from previous day: 07/06 0701 - 07/07 0700 In: 1080 [P.O.:1080] Out: -  Intake/Output from this shift:    Labs:  Recent Labs  12/19/12 2130 12/20/12 0550 12/21/12 0500  WBC 10.6* 10.6*  --   HGB 13.3 11.5*  --   PLT 369 310  --   CREATININE 8.52* 8.72* 9.66*   Estimated Creatinine Clearance: 5.9 ml/min (by C-G formula based on Cr of 9.66). No results found for this basename: VANCOTROUGH, Corlis Leak, VANCORANDOM, [place], GENTPEAK, [place], [place], TOBRAPEAK, TOBRARND, AMIKACINPEAK, AMIKACINTROU, AMIKACIN,  in the last 72 hours   Microbiology: Recent Results (from the past 720 hour(s))  URINE CULTURE     Status: None   Collection Time    12/20/12  2:09 AM      Result Value Range Status   Specimen Description URINE, CLEAN CATCH   Final   Special Requests CX ADDED AT 0233 ON [id number]   Final   Culture  Setup Time 12/20/2012 02:44   Final   [place] Count 45,000 COLONIES/ML   Final   Culture     Final   Value: Multiple bacterial morphotypes present, none predominant. Suggest appropriate recollection if clinically indicated.   Report Status 12/21/2012 FINAL   Final  MRSA PCR SCREENING     Status: None   Collection Time    12/20/12  6:27 AM      Result Value Range Status   MRSA by PCR NEGATIVE  NEGATIVE Final   Comment:            The GeneXpert MRSA Assay (FDA     approved for NASAL specimens     only), is one component of a     comprehensive MRSA colonization     surveillance program. It is not     intended to diagnose MRSA     infection nor to guide or     monitor  treatment for     MRSA infections.    Medical History: Past Medical History  Diagnosis Date  . Retinopathy   . Metabolic bone disease   . Anemia   . CHF (congestive heart failure)   . Pneumonia   . Blood transfusion   . Chronic kidney disease     M-W-F  . Hypertension     sees Dr. Carolin Guernsey  . Diabetes mellitus     "controlled with diet"  . Hemodialysis patient     Medications:  Prescriptions prior to admission  Medication Sig Dispense Refill  . amLODipine (NORVASC) 10 MG tablet Take 10 mg by mouth at bedtime.       . B Complex-C-Folic Acid (DIALYVITE [id number] PO) Take 1 tablet by mouth at bedtime.       . calcium acetate (PHOSLO) 667 MG capsule Take 1,334 mg by mouth 3 (three) times daily with meals.      . cloNIDine (CATAPRES) 0.3 MG tablet Take 0.3-0.6 mg by mouth 2 (two) times daily. Take one tablet in the morning. Take two at bedtime.      Marland Kitchen lisinopril (PRINIVIL,ZESTRIL) 20 MG tablet Take 40 mg by mouth at bedtime.       Marland Kitchen  oxyCODONE-acetaminophen (PERCOCET/ROXICET) 5-325 MG per tablet Take 1 tablet by mouth every 4 (four) hours as needed for pain.  15 tablet  0  . pravastatin (PRAVACHOL) 20 MG tablet Take 20 mg by mouth at bedtime.        Assessment: 42yof to start Zosyn for possible UTI. Patient has ESRD and receives HD MWF. Patient is currently afebrile and urine culture has no significant organisms.   Plan:  1. Zosyn 2.25g IV q8h 2. Follow-up vitals, cultures and need for antibiotic treatment  Earleen Newport [id number] 12/21/2012,8:44 AM

## 2012-12-21 NOTE — Progress Notes (Signed)
Better today with respect to nausea and vomiting but has not eaten much.  Will see how things go. It is important for her to see her vascular surgeon tomorrow, and if not possible would rec. VVS evaluation as pt appears at risk for loss of digit from steal syndrome.  Pt aware of recommendations.  I had a similar discussion with her last week at the [facility] and her appt with Dr. Lucky Cowboy was moved up to 7/8.  Apparently, the surgeon had been on vacation. Adrick Kestler C

## 2012-12-21 NOTE — Progress Notes (Addendum)
[facility] PROGRESS NOTE  [name] [id number] DOB: 31-Mar-1971 DOA: 12/19/2012 PCP: Benito Mccreedy, MD  Assessment/Plan: Principal Problem:   Nausea and vomiting Active Problems:   DIABETES MELLITUS II, UNCOMPLICATED   HYPERLIPIDEMIA   HTN (hypertension), malignant   End stage renal disease   Nausea vomiting Diabetic gastroparesis, viral gastroenteritis, UTI? LFTs normal recheck lipase   Urinary tract infection started the patient on Zosyn while urine culture is pending  End-stage renal disease to have hemodialysis done today  Hypertension blood pressure uncontrolled, continue Norvasc and clonidine, restarted lisinopril  Anemia of chronic disease  IDDM type II continue sliding scale insulin  Steal Syndrome Right arm avf-   needs fu with Dr. Berdine Addison VAS She has an ulcerating and what appears to be ischemic R third finger, this has been a problem for some time and she prefers to f/u with her IT trainer in [place] for this,   vasc surgeon consult declined   Code Status: full Family Communication: family updated about patient's clinical progress Disposition Plan:  Likely DC in am if tolerating diet   Brief narrative: [name] is a 42 y.o. female admitted with recurrent Nausea/vomiting since Monday 7/02 afternoon. She had her HD Monday am and was given her second series of Hepatitis Vaccination injections and later in afternoon GI symptoms started. She denies fevers, chills, sob,rash, chest,normal bms, deniesdysuria,and had no problems with initial Hepatitis injection.She missed her Friday HD [place] because of her gi symptoms and pain from steal type symptoms with right hand pain. She is being followed by Dr. Lucky Cowboy with [facility] for steal type problems with her right upper arm avf with right index finger ulcer/?ischemic. She reports a Tues, June 8th ov appt. with him to discuss revision of avf. Just finished some liquid breakfast and  not ready for solids yet .? UTI with ER UA results CS pending. Denies sob and states" was about >0.5kg below her edw wed at op hd secondary to poor appetite."   Consultants:  Nephrology  Procedures:  Hemodialysis  Antibiotics:  Zosyn  HPI/Subjective: Still nauseous this morning  Objective: Filed Vitals:   12/20/12 1350 12/20/12 1612 12/20/12 1955 12/21/12 0417  BP: 136/61 145/72 195/76 178/69  Pulse: 83 90 92 99  Temp: 98.3 F (36.8 C) 98.6 F (37 C) 98.7 F (37.1 C) 99.4 F (37.4 C)  TempSrc: Oral Oral Oral Oral  Resp: 16 18 18 18   Height:   5\' 2"  (1.575 m)   Weight:   55.963 kg (123 lb 6 oz)   SpO2: 96% 96% 98% 97%    Intake/Output Summary (Last 24 hours) at 12/21/12 0815 Last data filed at 12/21/12 0420  Gross per 24 hour  Intake    840 ml  Output      0 ml  Net    840 ml    Exam:  HENT:  Head: Atraumatic.  Nose: Nose normal.  Mouth/Throat: Oropharynx is clear and moist.  Eyes: Conjunctivae are normal. Pupils are equal, round, and reactive to light. No scleral icterus.  Neck: Neck supple. No tracheal deviation present.  Cardiovascular: Normal rate, regular rhythm, normal heart sounds and intact distal pulses.  Pulmonary/Chest: Effort normal and breath sounds normal. No respiratory distress.  Abdominal: Soft. Normal appearance and bowel sounds are normal. She exhibits no distension. There is no tenderness.  Musculoskeletal: She exhibits no edema and no tenderness.  Neurological: She is alert. No cranial nerve deficit.    Data Reviewed: Basic Metabolic  Panel:  Recent Labs Lab 12/19/12 2130 12/20/12 0550 12/21/12 0500  NA 136 132* 131*  K 3.9 4.0 3.9  CL 86* 89* 90*  CO2 23 23 26   GLUCOSE 73 204* 102*  BUN 50* 49* 52*  CREATININE 8.52* 8.72* 9.66*  CALCIUM 10.7* 9.3 9.6    Liver Function Tests:  Recent Labs Lab 12/19/12 2130  AST 17  ALT 16  ALKPHOS 70  BILITOT 0.1*  PROT 8.3  ALBUMIN 4.0   No results found for this basename:  LIPASE, AMYLASE,  in the last 168 hours No results found for this basename: AMMONIA,  in the last 168 hours  CBC:  Recent Labs Lab 12/19/12 2130 12/20/12 0550  WBC 10.6* 10.6*  NEUTROABS 7.2  --   HGB 13.3 11.5*  HCT 40.5 33.8*  MCV 86.9 84.9  PLT 369 310    Cardiac Enzymes:  Recent Labs Lab 12/19/12 2320  TROPONINI <0.30   BNP (last 3 results) No results found for this basename: PROBNP,  in the last 8760 hours   CBG:  Recent Labs Lab 12/20/12 1521 12/20/12 1959 12/20/12 2354 12/21/12 0420 12/21/12 0750  GLUCAP 106* 118* 106* 87 106*    Recent Results (from the past 240 hour(s))  URINE CULTURE     Status: None   Collection Time    12/20/12  2:09 AM      Result Value Range Status   Specimen Description URINE, CLEAN CATCH   Final   Special Requests CX ADDED AT 0233 ON [id number]   Final   Culture  Setup Time 12/20/2012 02:44   Final   Colony Count 45,000 COLONIES/ML   Final   Culture     Final   Value: Multiple bacterial morphotypes present, none predominant. Suggest appropriate recollection if clinically indicated.   Report Status 12/21/2012 FINAL   Final  MRSA PCR SCREENING     Status: None   Collection Time    12/20/12  6:27 AM      Result Value Range Status   MRSA by PCR NEGATIVE  NEGATIVE Final   Comment:            The GeneXpert MRSA Assay (FDA     approved for NASAL specimens     only), is one component of a     comprehensive MRSA colonization     surveillance program. It is not     intended to diagnose MRSA     infection nor to guide or     monitor treatment for     MRSA infections.     Studies: Dg Chest 2 View  12/20/2012   *RADIOLOGY REPORT*  Clinical Data: Shortness of breath.  Nausea and vomiting since Tuesday.  Hepatitis that seen on Monday.  CHEST - 2 VIEW  Comparison: 06/23/2012  Findings: Interval removal of central venous catheter. The heart size and pulmonary vascularity are normal. The lungs appear clear and expanded without focal air  space disease or consolidation. No blunting of the costophrenic angles.  No pneumothorax.  Mediastinal contours appear intact.  IMPRESSION: No evidence of active pulmonary disease.   Original Report Authenticated By: Lucienne Capers, M.D.    Scheduled Meds: . amLODipine  10 mg Oral QHS  . calcium acetate  1,334 mg Oral TID WC  . cloNIDine  0.3 mg Oral TID  . enoxaparin (LOVENOX) injection  30 mg Subcutaneous Q24H  . insulin aspart  0-9 Units Subcutaneous Q4H  . multivitamin  1 tablet Oral Daily  .  simvastatin  10 mg Oral q1800   Continuous Infusions:   Principal Problem:   Nausea and vomiting Active Problems:   DIABETES MELLITUS II, UNCOMPLICATED   HYPERLIPIDEMIA   HTN (hypertension), malignant   End stage renal disease    Time spent: 40 minutes   [facility] Pager [phone]. If 8PM-8AM, please contact night-coverage at [email or web address], password [facility] 12/21/2012, 8:15 AM  LOS: 2 days

## 2012-12-22 LAB — COMPREHENSIVE METABOLIC PANEL
ALT: 11 U/L (ref 0–35)
AST: 14 U/L (ref 0–37)
Albumin: 2.8 g/dL — ABNORMAL LOW (ref 3.5–5.2)
Alkaline Phosphatase: 45 U/L (ref 39–117)
BUN: 19 mg/dL (ref 6–23)
CO2: 30 mEq/L (ref 19–32)
Calcium: 9.3 mg/dL (ref 8.4–10.5)
Chloride: 95 mEq/L — ABNORMAL LOW (ref 96–112)
Creatinine, Ser: 5.66 mg/dL — ABNORMAL HIGH (ref 0.50–1.10)
GFR calc Af Amer: 10 mL/min — ABNORMAL LOW (ref >90)
GFR calc non Af Amer: 8 mL/min — ABNORMAL LOW (ref >90)
Glucose, Bld: 142 mg/dL — ABNORMAL HIGH (ref 70–99)
Potassium: 3.6 mEq/L (ref 3.5–5.1)
Sodium: 135 mEq/L (ref 135–145)
Total Bilirubin: 0.2 mg/dL — ABNORMAL LOW (ref 0.3–1.2)
Total Protein: 6.1 g/dL (ref 6.0–8.3)

## 2012-12-22 LAB — GLUCOSE, CAPILLARY
Glucose-Capillary: 150 mg/dL — ABNORMAL HIGH (ref 70–99)
Glucose-Capillary: 189 mg/dL — ABNORMAL HIGH (ref 70–99)

## 2012-12-22 MED ORDER — CEPHALEXIN 500 MG PO CAPS: 500.0000 mg | ORAL_CAPSULE | Freq: Two times a day (BID) | ORAL | Status: DC

## 2012-12-22 NOTE — Progress Notes (Signed)
Pt seen. Plan for discharge.  She cancelled and rescheduled her appt with vascular surgeon in [place] for today.  Will have next HD treatment in AM. We will sign off. [name]

## 2012-12-22 NOTE — Discharge Summary (Signed)
Physician Discharge Summary  [name] MRN: [id number] DOB/AGE: [birth year] 43 y.o.  PCP: Benito Mccreedy, MD   Admit date: 12/19/2012 Discharge date: 12/22/2012  Discharge Diagnoses:  Chronic steal syndrome right arm with right upper arm AV fistula Urinary tract infection   Nausea and vomiting Active Problems:   DIABETES MELLITUS II, UNCOMPLICATED   HYPERLIPIDEMIA   HTN (hypertension), malignant   End stage renal disease     Medication List         amLODipine 10 MG tablet  Commonly known as:  NORVASC  Take 10 mg by mouth at bedtime.     calcium acetate 667 MG capsule  Commonly known as:  PHOSLO  Take 1,334 mg by mouth 3 (three) times daily with meals.     cephALEXin 500 MG capsule  Commonly known as:  KEFLEX  Take 1 capsule (500 mg total) by mouth 2 (two) times daily.     cloNIDine 0.3 MG tablet  Commonly known as:  CATAPRES  Take 0.3-0.6 mg by mouth 2 (two) times daily. Take one tablet in the morning. Take two at bedtime.     DIALYVITE 800 PO  Take 1 tablet by mouth at bedtime.     lisinopril 20 MG tablet  Commonly known as:  PRINIVIL,ZESTRIL  Take 40 mg by mouth at bedtime.     oxyCODONE-acetaminophen 5-325 MG per tablet  Commonly known as:  PERCOCET/ROXICET  Take 1 tablet by mouth every 4 (four) hours as needed for pain.     pravastatin 20 MG tablet  Commonly known as:  PRAVACHOL  Take 20 mg by mouth at bedtime.        Discharge Condition: Stable  Disposition: 01-Home or Self Care   Consults: * Nephrology Vascular surgery  Significant Diagnostic Studies: Dg Chest 2 View  12/20/2012   *RADIOLOGY REPORT*  Clinical Data: Shortness of breath.  Nausea and vomiting since Tuesday.  Hepatitis that seen on Monday.  CHEST - 2 VIEW  Comparison: 06/23/2012  Findings: Interval removal of central venous catheter. The heart size and pulmonary vascularity are normal. The lungs appear clear and expanded without focal air space disease or consolidation.  No blunting of the costophrenic angles.  No pneumothorax.  Mediastinal contours appear intact.  IMPRESSION: No evidence of active pulmonary disease.   Original Report Authenticated By: Lucienne Capers, M.D.   Dg Abd 1 View  12/21/2012   *RADIOLOGY REPORT*  Clinical Data: Nausea since yesterday.  ABDOMEN - 1 VIEW  Comparison: None.  Findings: Supine view of the abdomen and pelvis.  Nonobstructive bowel gas pattern.  Moderate ascending colonic stool. Distal gas and stool.  No free intraperitoneal air.  No abnormal abdominal calcifications.   No appendicolith.  Vascular calcifications.  Right hemidiaphragm elevation.  IMPRESSION: No acute findings.   Original Report Authenticated By: Abigail Miyamoto, M.D.       Microbiology: Recent Results (from the past 240 hour(s))  URINE CULTURE     Status: None   Collection Time    12/20/12  2:09 AM      Result Value Range Status   Specimen Description URINE, CLEAN CATCH   Final   Special Requests CX ADDED AT [id number] ON [id number]   Final   Culture  Setup Time 12/20/2012 02:44   Final   Colony Count 45,000 COLONIES/ML   Final   Culture     Final   Value: Multiple bacterial morphotypes present, none predominant. Suggest appropriate recollection if clinically indicated.   Report  Status 12/21/2012 FINAL   Final  MRSA PCR SCREENING     Status: None   Collection Time    12/20/12  6:27 AM      Result Value Range Status   MRSA by PCR NEGATIVE  NEGATIVE Final   Comment:            The GeneXpert MRSA Assay (FDA     approved for NASAL specimens     only), is one component of a     comprehensive MRSA colonization     surveillance program. It is not     intended to diagnose MRSA     infection nor to guide or     monitor treatment for     MRSA infections.     Labs: Results for orders placed during the hospital encounter of 12/19/12 (from the past 48 hour(s))  GLUCOSE, CAPILLARY     Status: None   Collection Time    12/20/12 10:55 AM      Result Value Range    Glucose-Capillary 96  70 - 99 mg/dL  GLUCOSE, CAPILLARY     Status: Abnormal   Collection Time    12/20/12  3:21 PM      Result Value Range   Glucose-Capillary 106 (*) 70 - 99 mg/dL  GLUCOSE, CAPILLARY     Status: Abnormal   Collection Time    12/20/12  7:59 PM      Result Value Range   Glucose-Capillary 118 (*) 70 - 99 mg/dL  GLUCOSE, CAPILLARY     Status: Abnormal   Collection Time    12/20/12 11:54 PM      Result Value Range   Glucose-Capillary 106 (*) 70 - 99 mg/dL  GLUCOSE, CAPILLARY     Status: None   Collection Time    12/21/12  4:20 AM      Result Value Range   Glucose-Capillary 87  70 - 99 mg/dL  BASIC METABOLIC PANEL     Status: Abnormal   Collection Time    12/21/12  5:00 AM      Result Value Range   Sodium 131 (*) 135 - 145 mEq/L   Potassium 3.9  3.5 - 5.1 mEq/L   Chloride 90 (*) 96 - 112 mEq/L   CO2 26  19 - 32 mEq/L   Glucose, Bld 102 (*) 70 - 99 mg/dL   BUN 52 (*) 6 - 23 mg/dL   Creatinine, Ser 9.66 (*) 0.50 - 1.10 mg/dL   Calcium 9.6  8.4 - 10.5 mg/dL   GFR calc non Af Amer 4 (*) >90 mL/min   GFR calc Af Amer 5 (*) >90 mL/min   Comment:            The eGFR has been calculated     using the CKD EPI equation.     This calculation has not been     validated in all clinical     situations.     eGFR's persistently     <90 mL/min signify     possible Chronic Kidney Disease.  GLUCOSE, CAPILLARY     Status: Abnormal   Collection Time    12/21/12  7:50 AM      Result Value Range   Glucose-Capillary 106 (*) 70 - 99 mg/dL   Comment 1 Notify RN     Comment 2 Documented in Chart    TROPONIN I     Status: None   Collection Time    12/21/12  9:01  AM      Result Value Range   Troponin I <0.30  <0.30 ng/mL   Comment:            Due to the release kinetics of cTnI,     a negative result within the first hours     of the onset of symptoms does not rule out     myocardial infarction with certainty.     If myocardial infarction is still suspected,     repeat  the test at appropriate intervals.  LIPASE, BLOOD     Status: None   Collection Time    12/21/12  9:03 AM      Result Value Range   Lipase 31  11 - 59 U/L  GLUCOSE, CAPILLARY     Status: Abnormal   Collection Time    12/21/12 11:28 AM      Result Value Range   Glucose-Capillary 124 (*) 70 - 99 mg/dL  CBC     Status: Abnormal   Collection Time    12/21/12  1:41 PM      Result Value Range   WBC 12.2 (*) 4.0 - 10.5 K/uL   RBC 4.15  3.87 - 5.11 MIL/uL   Hemoglobin 11.9 (*) 12.0 - 15.0 g/dL   HCT 35.0 (*) 36.0 - 46.0 %   MCV 84.3  78.0 - 100.0 fL   MCH 28.7  26.0 - 34.0 pg   MCHC 34.0  30.0 - 36.0 g/dL   RDW 13.6  11.5 - 15.5 %   Platelets 311  150 - 400 K/uL  RENAL FUNCTION PANEL     Status: Abnormal   Collection Time    12/21/12  1:41 PM      Result Value Range   Sodium 133 (*) 135 - 145 mEq/L   Potassium 4.1  3.5 - 5.1 mEq/L   Chloride 87 (*) 96 - 112 mEq/L   CO2 22  19 - 32 mEq/L   Glucose, Bld 110 (*) 70 - 99 mg/dL   BUN 52 (*) 6 - 23 mg/dL   Creatinine, Ser 10.60 (*) 0.50 - 1.10 mg/dL   Calcium 9.8  8.4 - 10.5 mg/dL   Phosphorus 8.4 (*) 2.3 - 4.6 mg/dL   Albumin 3.4 (*) 3.5 - 5.2 g/dL   GFR calc non Af Amer 4 (*) >90 mL/min   GFR calc Af Amer 5 (*) >90 mL/min   Comment:            The eGFR has been calculated     using the CKD EPI equation.     This calculation has not been     validated in all clinical     situations.     eGFR's persistently     <90 mL/min signify     possible Chronic Kidney Disease.  HEPATITIS B SURFACE ANTIGEN     Status: None   Collection Time    12/21/12  1:41 PM      Result Value Range   Hepatitis B Surface Ag NEGATIVE  NEGATIVE  GLUCOSE, CAPILLARY     Status: None   Collection Time    12/21/12  6:18 PM      Result Value Range   Glucose-Capillary 73  70 - 99 mg/dL   Comment 1 Notify RN     Comment 2 Documented in Chart    GLUCOSE, CAPILLARY     Status: Abnormal   Collection Time    12/21/12  8:43 PM  Result Value Range    Glucose-Capillary 168 (*) 70 - 99 mg/dL  GLUCOSE, CAPILLARY     Status: Abnormal   Collection Time    12/22/12  4:16 AM      Result Value Range   Glucose-Capillary 150 (*) 70 - 99 mg/dL  COMPREHENSIVE METABOLIC PANEL     Status: Abnormal   Collection Time    12/22/12  4:55 AM      Result Value Range   Sodium 135  135 - 145 mEq/L   Potassium 3.6  3.5 - 5.1 mEq/L   Chloride 95 (*) 96 - 112 mEq/L   Comment: DELTA CHECK NOTED   CO2 30  19 - 32 mEq/L   Glucose, Bld 142 (*) 70 - 99 mg/dL   BUN 19  6 - 23 mg/dL   Comment: DELTA CHECK NOTED   Creatinine, Ser 5.66 (*) 0.50 - 1.10 mg/dL   Comment: DELTA CHECK NOTED   Calcium 9.3  8.4 - 10.5 mg/dL   Total Protein 6.1  6.0 - 8.3 g/dL   Albumin 2.8 (*) 3.5 - 5.2 g/dL   AST 14  0 - 37 U/L   ALT 11  0 - 35 U/L   Alkaline Phosphatase 45  39 - 117 U/L   Total Bilirubin 0.2 (*) 0.3 - 1.2 mg/dL   GFR calc non Af Amer 8 (*) >90 mL/min   GFR calc Af Amer 10 (*) >90 mL/min   Comment:            The eGFR has been calculated     using the CKD EPI equation.     This calculation has not been     validated in all clinical     situations.     eGFR's persistently     <90 mL/min signify     possible Chronic Kidney Disease.     HPI : This is a 42 y.o. Female that was brought to the hospital secondary to dehydration. She received a Hepatitis vaccination and had been vomiting for 5 days. She has a history of ESRD,and has been on HD for 2 years. She was last seen by Dr. Scot Dock in Feb. 2014 s/p ligation of competing branches right AV fistula. She got a paper cut on the dorsum of the right 3erd finger that has progressed into a non healing ulcer. Medical history includes HTN, CHF, and DM.  She is followed by Dr. Lucky Cowboy in [place] and he was planning Vein mapping and a procedure on the right AV fistula tomorrow prior to her being admitted into the hospital.     HOSPITAL COURSE:   1. Nausea/ Vomiting/ - ? DM Gastropareis.? Viral GI/ patient also had a  urinary tract infection and was initiated on Zosyn, urine culture is pending, she has been transitioned to Keflex but she will continue for another 5 days 2. ESRD - MWF HD schedule ( missed Friday HD) Labs okay and vol. loss hemodialysis in the hospital was 7/7 3. Steal Syndrome RUA AVF - attempt hd in am bfr 300 to 400 as tolerated/ Has fu with Dr. Berdine Addison [facility] on 12/22/12 in late afternoon. She has an ulcerating and what appears to be ischemic R third finger, this has been a problem for some time and she prefers to f/u with her IT trainer in [place] for this. She initially declined offer of eval by local vasc surgeon here, she was evaluated by Dr.Todd Early recommend followup with her vascular surgeon in [place]  4. Hypertension/volume - ^ BP. Continue home and prn meds for bp control.  5. Anemia - HGB 11.5 on weekly epo op.  Last Tsat 37% on 6/27. No IV Fe given in the hospital 6. Metabolic bone disease - Ca 9.6. Phos 5.5 op. PTH at goal in April. Continue hectorol 1 and binders 7. Nutrition - albumin 4.0. renal vit. Neysa Hotter carb. Mod diet  8. IDDM type 2 -controlled with SSI, last hemoglobin A1c on 7/646.3    Discharge Exam:   Blood pressure 148/61, pulse 85, temperature 99.3 F (37.4 C), temperature source Oral, resp. rate 18, height 5\' 2"  (1.575 m), weight 54.8 kg (120 lb 13 oz), last menstrual period 12/11/2012, SpO2 96.00%. General: Alert, cooperative, NAD  Heart: RRR  Lungs: CTA bilaterally  Abdomen: soft, NT, non-distended, normal BS  Extremities: Large ulceration to R third finger, No LE edema  Dialysis Access: RUA AVF + bruit         Signed: Sabella Traore 12/22/2012, 8:17 AM

## 2012-12-23 ENCOUNTER — Encounter (HOSPITAL_COMMUNITY): Payer: Self-pay | Admitting: Emergency Medicine

## 2012-12-23 ENCOUNTER — Inpatient Hospital Stay (HOSPITAL_COMMUNITY)
Admission: EM | Admit: 2012-12-23 | Discharge: 2012-12-25 | DRG: 377 | Disposition: A | Discharge status: Home / Self Care | Payer: Medicare Other | Attending: Family Medicine | Admitting: Family Medicine

## 2012-12-23 ENCOUNTER — Emergency Department (HOSPITAL_COMMUNITY): Payer: Medicare Other

## 2012-12-23 DIAGNOSIS — K253 Acute gastric ulcer without hemorrhage or perforation: Secondary | ICD-10-CM

## 2012-12-23 DIAGNOSIS — K2970 Gastritis, unspecified, without bleeding: Secondary | ICD-10-CM | POA: Diagnosis present

## 2012-12-23 DIAGNOSIS — E785 Hyperlipidemia, unspecified: Secondary | ICD-10-CM | POA: Diagnosis present

## 2012-12-23 DIAGNOSIS — K920 Hematemesis: Principal | ICD-10-CM | POA: Diagnosis present

## 2012-12-23 DIAGNOSIS — Z992 Dependence on renal dialysis: Secondary | ICD-10-CM

## 2012-12-23 DIAGNOSIS — M949 Disorder of cartilage, unspecified: Secondary | ICD-10-CM | POA: Diagnosis present

## 2012-12-23 DIAGNOSIS — I509 Heart failure, unspecified: Secondary | ICD-10-CM | POA: Diagnosis present

## 2012-12-23 DIAGNOSIS — N186 End stage renal disease: Secondary | ICD-10-CM | POA: Diagnosis present

## 2012-12-23 DIAGNOSIS — K2990 Gastroduodenitis, unspecified, without bleeding: Secondary | ICD-10-CM

## 2012-12-23 DIAGNOSIS — I10 Essential (primary) hypertension: Secondary | ICD-10-CM

## 2012-12-23 DIAGNOSIS — M899 Disorder of bone, unspecified: Secondary | ICD-10-CM | POA: Diagnosis present

## 2012-12-23 DIAGNOSIS — I120 Hypertensive chronic kidney disease with stage 5 chronic kidney disease or end stage renal disease: Secondary | ICD-10-CM | POA: Diagnosis present

## 2012-12-23 DIAGNOSIS — E1151 Type 2 diabetes mellitus with diabetic peripheral angiopathy without gangrene: Secondary | ICD-10-CM | POA: Diagnosis present

## 2012-12-23 DIAGNOSIS — K259 Gastric ulcer, unspecified as acute or chronic, without hemorrhage or perforation: Secondary | ICD-10-CM | POA: Diagnosis present

## 2012-12-23 DIAGNOSIS — Y841 Kidney dialysis as the cause of abnormal reaction of the patient, or of later complication, without mention of misadventure at the time of the procedure: Secondary | ICD-10-CM | POA: Diagnosis present

## 2012-12-23 DIAGNOSIS — K5900 Constipation, unspecified: Secondary | ICD-10-CM | POA: Diagnosis not present

## 2012-12-23 DIAGNOSIS — E1165 Type 2 diabetes mellitus with hyperglycemia: Secondary | ICD-10-CM | POA: Diagnosis present

## 2012-12-23 DIAGNOSIS — N2581 Secondary hyperparathyroidism of renal origin: Secondary | ICD-10-CM | POA: Diagnosis present

## 2012-12-23 DIAGNOSIS — T82898A Other specified complication of vascular prosthetic devices, implants and grafts, initial encounter: Secondary | ICD-10-CM | POA: Diagnosis present

## 2012-12-23 DIAGNOSIS — IMO0002 Reserved for concepts with insufficient information to code with codable children: Secondary | ICD-10-CM | POA: Diagnosis present

## 2012-12-23 DIAGNOSIS — E43 Unspecified severe protein-calorie malnutrition: Secondary | ICD-10-CM | POA: Diagnosis present

## 2012-12-23 DIAGNOSIS — Z87440 Personal history of urinary (tract) infections: Secondary | ICD-10-CM

## 2012-12-23 DIAGNOSIS — E119 Type 2 diabetes mellitus without complications: Secondary | ICD-10-CM | POA: Diagnosis present

## 2012-12-23 DIAGNOSIS — N390 Urinary tract infection, site not specified: Secondary | ICD-10-CM | POA: Diagnosis present

## 2012-12-23 LAB — COMPREHENSIVE METABOLIC PANEL
ALT: 17 U/L (ref 0–35)
AST: 25 U/L (ref 0–37)
Albumin: 3.6 g/dL (ref 3.5–5.2)
Alkaline Phosphatase: 58 U/L (ref 39–117)
BUN: 27 mg/dL — ABNORMAL HIGH (ref 6–23)
CO2: 27 mEq/L (ref 19–32)
Calcium: 10.7 mg/dL — ABNORMAL HIGH (ref 8.4–10.5)
Chloride: 92 mEq/L — ABNORMAL LOW (ref 96–112)
Creatinine, Ser: 7.68 mg/dL — ABNORMAL HIGH (ref 0.50–1.10)
GFR calc Af Amer: 7 mL/min — ABNORMAL LOW (ref >90)
GFR calc non Af Amer: 6 mL/min — ABNORMAL LOW (ref >90)
Glucose, Bld: 130 mg/dL — ABNORMAL HIGH (ref 70–99)
Potassium: 3.6 mEq/L (ref 3.5–5.1)
Sodium: 137 mEq/L (ref 135–145)
Total Bilirubin: 0.2 mg/dL — ABNORMAL LOW (ref 0.3–1.2)
Total Protein: 7.8 g/dL (ref 6.0–8.3)

## 2012-12-23 LAB — TYPE AND SCREEN
ABO/RH(D): A POS
Antibody Screen: NEGATIVE

## 2012-12-23 LAB — CBC WITH DIFFERENTIAL/PLATELET
Basophils Absolute: 0.1 10*3/uL (ref 0.0–0.1)
Basophils Relative: 1 % (ref 0–1)
Eosinophils Absolute: 0.4 10*3/uL (ref 0.0–0.7)
Eosinophils Relative: 3 % (ref 0–5)
HCT: 37.4 % (ref 36.0–46.0)
Hemoglobin: 12.6 g/dL (ref 12.0–15.0)
Lymphocytes Relative: 20 % (ref 12–46)
Lymphs Abs: 2.2 10*3/uL (ref 0.7–4.0)
MCH: 28.8 pg (ref 26.0–34.0)
MCHC: 33.7 g/dL (ref 30.0–36.0)
MCV: 85.6 fL (ref 78.0–100.0)
Monocytes Absolute: 0.8 10*3/uL (ref 0.1–1.0)
Monocytes Relative: 8 % (ref 3–12)
Neutro Abs: 7.5 10*3/uL (ref 1.7–7.7)
Neutrophils Relative %: 68 % (ref 43–77)
Platelets: 297 10*3/uL (ref 150–400)
RBC: 4.37 MIL/uL (ref 3.87–5.11)
RDW: 13.3 % (ref 11.5–15.5)
WBC: 10.9 10*3/uL — ABNORMAL HIGH (ref 4.0–10.5)

## 2012-12-23 LAB — CBC
HCT: 34.5 % — ABNORMAL LOW (ref 36.0–46.0)
Hemoglobin: 11.5 g/dL — ABNORMAL LOW (ref 12.0–15.0)
MCH: 28.3 pg (ref 26.0–34.0)
MCHC: 33.3 g/dL (ref 30.0–36.0)
MCV: 85 fL (ref 78.0–100.0)
Platelets: 297 10*3/uL (ref 150–400)
RBC: 4.06 MIL/uL (ref 3.87–5.11)
RDW: 13.6 % (ref 11.5–15.5)
WBC: 8.9 10*3/uL (ref 4.0–10.5)

## 2012-12-23 LAB — LIPASE, BLOOD: Lipase: 32 U/L (ref 11–59)

## 2012-12-23 LAB — GLUCOSE, CAPILLARY: Glucose-Capillary: 73 mg/dL (ref 70–99)

## 2012-12-23 LAB — HEMOGLOBIN AND HEMATOCRIT, BLOOD
HCT: 36.3 % (ref 36.0–46.0)
Hemoglobin: 12.3 g/dL (ref 12.0–15.0)

## 2012-12-23 LAB — OCCULT BLOOD GASTRIC / DUODENUM (SPECIMEN CUP): Occult Blood, Gastric: POSITIVE — AB

## 2012-12-23 MED ORDER — SODIUM CHLORIDE 0.9 % IV SOLN: INTRAVENOUS | Status: AC

## 2012-12-23 MED ORDER — NEPRO/CARBSTEADY PO LIQD: 237.0000 mL | ORAL | Status: DC | PRN

## 2012-12-23 MED ORDER — OXYCODONE-ACETAMINOPHEN 5-325 MG PO TABS
1.0000 | ORAL_TABLET | ORAL | Status: DC | PRN
  Administered 2012-12-23 – 2012-12-25 (×4): 1 via ORAL
  Filled 2012-12-23 (×3): qty 1

## 2012-12-23 MED ORDER — SIMVASTATIN 10 MG PO TABS
10.0000 mg | ORAL_TABLET | Freq: Every day | ORAL | Status: DC
  Administered 2012-12-24: 10 mg via ORAL
  Filled 2012-12-23 (×3): qty 1

## 2012-12-23 MED ORDER — LIDOCAINE HCL (PF) 1 % IJ SOLN: 5.0000 mL | INTRAMUSCULAR | Status: DC | PRN

## 2012-12-23 MED ORDER — ACETAMINOPHEN 650 MG RE SUPP: 650.0000 mg | Freq: Four times a day (QID) | RECTAL | Status: DC | PRN

## 2012-12-23 MED ORDER — CLONIDINE HCL 0.3 MG PO TABS
0.3000 mg | ORAL_TABLET | Freq: Two times a day (BID) | ORAL | Status: DC
  Administered 2012-12-23 – 2012-12-25 (×4): 0.3 mg via ORAL
  Filled 2012-12-23 (×5): qty 1

## 2012-12-23 MED ORDER — PANTOPRAZOLE SODIUM 40 MG IV SOLR
40.0000 mg | Freq: Two times a day (BID) | INTRAVENOUS | Status: DC
  Administered 2012-12-23 – 2012-12-24 (×2): 40 mg via INTRAVENOUS
  Filled 2012-12-23 (×3): qty 40

## 2012-12-23 MED ORDER — LIDOCAINE-PRILOCAINE 2.5-2.5 % EX CREA: 1.0000 "application " | TOPICAL_CREAM | CUTANEOUS | Status: DC | PRN

## 2012-12-23 MED ORDER — CEPHALEXIN 500 MG PO CAPS
500.0000 mg | ORAL_CAPSULE | Freq: Two times a day (BID) | ORAL | Status: DC
  Administered 2012-12-23: 500 mg via ORAL
  Filled 2012-12-23 (×3): qty 1

## 2012-12-23 MED ORDER — SODIUM CHLORIDE 0.9 % IV SOLN: 100.0000 mL | INTRAVENOUS | Status: DC | PRN

## 2012-12-23 MED ORDER — HEPARIN SODIUM (PORCINE) 1000 UNIT/ML DIALYSIS: 1000.0000 [IU] | INTRAMUSCULAR | Status: DC | PRN

## 2012-12-23 MED ORDER — SODIUM CHLORIDE 0.9 % IV SOLN
1000.0000 mL | INTRAVENOUS | Status: DC
Start: 2012-12-23 — End: 2012-12-23

## 2012-12-23 MED ORDER — INSULIN ASPART 100 UNIT/ML ~~LOC~~ SOLN
0.0000 [IU] | Freq: Three times a day (TID) | SUBCUTANEOUS | Status: DC
  Administered 2012-12-24: 1 [IU] via SUBCUTANEOUS

## 2012-12-23 MED ORDER — ONDANSETRON HCL 4 MG/2ML IJ SOLN
4.0000 mg | Freq: Once | INTRAMUSCULAR | Status: AC
Start: 2012-12-23 — End: 2012-12-23
  Administered 2012-12-23: 4 mg via INTRAVENOUS
  Filled 2012-12-23: qty 2

## 2012-12-23 MED ORDER — SODIUM CHLORIDE 0.9 % IJ SOLN
3.0000 mL | Freq: Two times a day (BID) | INTRAMUSCULAR | Status: DC
  Administered 2012-12-23 – 2012-12-25 (×4): 3 mL via INTRAVENOUS

## 2012-12-23 MED ORDER — INSULIN ASPART 100 UNIT/ML ~~LOC~~ SOLN: 0.0000 [IU] | Freq: Every day | SUBCUTANEOUS | Status: DC

## 2012-12-23 MED ORDER — DOXERCALCIFEROL 4 MCG/2ML IV SOLN
1.0000 ug | INTRAVENOUS | Status: DC
  Administered 2012-12-25: 1 ug via INTRAVENOUS
  Filled 2012-12-23: qty 2

## 2012-12-23 MED ORDER — ACETAMINOPHEN 325 MG PO TABS
650.0000 mg | ORAL_TABLET | Freq: Four times a day (QID) | ORAL | Status: DC | PRN
  Administered 2012-12-25: 650 mg via ORAL

## 2012-12-23 MED ORDER — LISINOPRIL 40 MG PO TABS
40.0000 mg | ORAL_TABLET | Freq: Every day | ORAL | Status: DC
  Administered 2012-12-23 – 2012-12-24 (×2): 40 mg via ORAL
  Filled 2012-12-23 (×3): qty 1

## 2012-12-23 MED ORDER — PANTOPRAZOLE SODIUM 40 MG IV SOLR
40.0000 mg | Freq: Once | INTRAVENOUS | Status: AC
  Administered 2012-12-23: 40 mg via INTRAVENOUS
  Filled 2012-12-23: qty 40

## 2012-12-23 MED ORDER — PENTAFLUOROPROP-TETRAFLUOROETH EX AERO: 1.0000 "application " | INHALATION_SPRAY | CUTANEOUS | Status: DC | PRN

## 2012-12-23 MED ORDER — ALTEPLASE 2 MG IJ SOLR: 2.0000 mg | Freq: Once | INTRAMUSCULAR | Status: DC | PRN

## 2012-12-23 MED ORDER — AMLODIPINE BESYLATE 10 MG PO TABS
10.0000 mg | ORAL_TABLET | Freq: Every day | ORAL | Status: DC
Start: 2012-12-24 — End: 2012-12-25
  Administered 2012-12-24: 10 mg via ORAL
  Filled 2012-12-23 (×2): qty 1

## 2012-12-23 NOTE — Procedures (Signed)
Presented with tachycardia to 120 bpm and BP up to 240/102.  Hbg OK but will plan to recheck. We are trying for 3 liters and hope to optimize BP. [name]

## 2012-12-23 NOTE — ED Provider Notes (Signed)
History    CSN: [id number] Arrival date & time 12/23/12  [id number]  First MD Initiated Contact with Patient 12/23/12 [phone]     Chief Complaint  Patient presents with  . Hematemesis   (Consider location/radiation/quality/duration/timing/severity/associated sxs/prior Treatment) HPI Comments: Patient is a 42 year old woman with endstage renal disease, dialyzing Monday Wednesday and Friday. She had been hospitalized last week, and was discharged yesterday. She had been given a hepatitis shot a week ago Monday, and had developed intractable vomiting and dehydration. Around 1 in the morning, she started having vomiting of blood. She had not had this before. She is not on any anticoagulant medications, and doesn't take aspirin or nonsteroidal anti-inflammatory drugs.  Patient is a 42 y.o. female presenting with GI illness.  GI Problem This is a new problem. The current episode started 6 to 12 hours ago. Episode frequency: Intermittent vomiting, vomited once here, bringing up about 200 cc of dark fluid. The problem has not changed since onset.Pertinent negatives include no chest pain, no abdominal pain, no headaches and no shortness of breath. Nothing aggravates the symptoms. Nothing relieves the symptoms. She has tried nothing for the symptoms.   Past Medical History  Diagnosis Date  . Retinopathy   . Metabolic bone disease   . Anemia   . CHF (congestive heart failure)   . Pneumonia   . Blood transfusion   . Chronic kidney disease     M-W-F  . Hypertension     sees Dr. Carolin Guernsey  . Diabetes mellitus     "controlled with diet"  . Hemodialysis patient    Past Surgical History  Procedure Laterality Date  . Insertion of dialysis catheter  10/04/2011    Procedure: INSERTION OF DIALYSIS CATHETER;  Surgeon: Angelia Mould, MD;  Location: [place];  Service: Vascular;  Laterality: Right;  insertion of dialysis catheter right internal jugular  . Av fistula placement  10/04/2011    Procedure:  INSERTION OF ARTERIOVENOUS (AV) GORE-TEX GRAFT ARM;  Surgeon: Angelia Mould, MD;  Location: [place];  Service: Vascular;  Laterality: Left;  Insertion left upper arm Arteriovenous goretex graft  . Avgg removal  10/04/2011    Procedure: REMOVAL OF ARTERIOVENOUS GORETEX GRAFT ([place]);  Surgeon: Elam Dutch, MD;  Location: [place];  Service: Vascular;  Laterality: Left;  . Av fistula placement  10/29/2011    Procedure: ARTERIOVENOUS (AV) FISTULA CREATION;  Surgeon: Angelia Mould, MD;  Location: [facility] OR;  Service: Vascular;  Laterality: Right;  Creation Right Arteriovenous Fistula   . Revison of arteriovenous fistula  06/23/2012    Procedure: REVISON OF ARTERIOVENOUS FISTULA;  Surgeon: Angelia Mould, MD;  Location: [place];  Service: Vascular;  Laterality: Right;  Ultrasound guided  . Insertion of dialysis catheter  06/23/2012    Procedure: INSERTION OF DIALYSIS CATHETER;  Surgeon: Angelia Mould, MD;  Location: [place];  Service: Vascular;  Laterality: N/A;  Ultrasound guided  . Patch angioplasty  06/23/2012    Procedure: PATCH ANGIOPLASTY;  Surgeon: Angelia Mould, MD;  Location: [facility] OR;  Service: Vascular;  Laterality: Right;   Family History  Problem Relation Age of Onset  . Malignant hyperthermia Mother   . Malignant hyperthermia Father   . Anesthesia problems Neg Hx    History  Substance Use Topics  . Smoking status: Never Smoker   . Smokeless tobacco: Never Used  . Alcohol Use: No   OB History   Grav Para Term Preterm Abortions TAB SAB Ect  Mult Living                 Review of Systems  Constitutional: Negative.  Negative for fever and chills.  HENT: Negative.   Eyes: Negative.   Respiratory: Negative.  Negative for shortness of breath.   Cardiovascular: Negative for chest pain.  Gastrointestinal: Positive for nausea and vomiting. Negative for abdominal pain.       Vomiting coffee grounds material.  Genitourinary: Negative.   Musculoskeletal: Negative.         Dialysis shunt in right upper arm.  Skin: Positive for pallor.  Neurological: Negative.  Negative for headaches.  Psychiatric/Behavioral: Negative.     Allergies  Review of patient's allergies indicates no known allergies.  Home Medications   Current Outpatient Rx  Name  Route  Sig  Dispense  Refill  . amLODipine (NORVASC) 10 MG tablet   Oral   Take 10 mg by mouth at bedtime.          . B Complex-C-Folic Acid (DIALYVITE Q000111Q PO)   Oral   Take 1 tablet by mouth at bedtime.          . calcium acetate (PHOSLO) 667 MG capsule   Oral   Take 1,334 mg by mouth 3 (three) times daily with meals.         . cephALEXin (KEFLEX) 500 MG capsule   Oral   Take 1 capsule (500 mg total) by mouth 2 (two) times daily.   30 capsule   0   . cloNIDine (CATAPRES) 0.3 MG tablet   Oral   Take 0.3-0.6 mg by mouth 2 (two) times daily. Take one tablet in the morning. Take two at bedtime.         Marland Kitchen lisinopril (PRINIVIL,ZESTRIL) 20 MG tablet   Oral   Take 40 mg by mouth at bedtime.          Marland Kitchen oxyCODONE-acetaminophen (PERCOCET/ROXICET) 5-325 MG per tablet   Oral   Take 1 tablet by mouth every 4 (four) hours as needed for pain.   15 tablet   0   . pravastatin (PRAVACHOL) 20 MG tablet   Oral   Take 20 mg by mouth at bedtime.           BP 181/73  Pulse 100  Temp(Src) 98 F (36.7 C) (Oral)  Resp 18  Ht 5\' 2"  ([id number] m)  Wt 123 lb (55.792 kg)  BMI 22.49 kg/m2  SpO2 97%  LMP 12/11/2012 Physical Exam  Nursing note and vitals reviewed. Constitutional:  Middle-aged woman, pale appearance, mild distress.  HENT:  Head: Normocephalic and atraumatic.  Right Ear: External ear normal.  Left Ear: External ear normal.  Mouth/Throat: Oropharynx is clear and moist.  Eyes: Conjunctivae and EOM are normal. Pupils are equal, round, and reactive to light. No scleral icterus.  Neck: Normal range of motion. Neck supple.  Cardiovascular: Normal rate, regular rhythm and normal heart  sounds.   Pulmonary/Chest: Effort normal and breath sounds normal.  Abdominal: Soft. Bowel sounds are normal. She exhibits no distension. There is no tenderness.  No mass rebound or rigidity.  Musculoskeletal: Normal range of motion. She exhibits no edema and no tenderness.  AV fistula in right upper arm with a palpable thrill.  Skin: Skin is warm and dry. There is pallor.  Psychiatric: She has a normal mood and affect. Her behavior is normal.    ED Course  Procedures (including critical care time) Labs Reviewed  CBC WITH DIFFERENTIAL -  Abnormal; Notable for the following:    WBC 10.9 (*)    All other components within normal limits  COMPREHENSIVE METABOLIC PANEL  OCCULT BLOOD X 1 CARD TO LAB, STOOL  LIPASE, BLOOD  TYPE AND SCREEN   Dg Abd 1 View  12/21/2012   *RADIOLOGY REPORT*  Clinical Data: Nausea since yesterday.  ABDOMEN - 1 VIEW  Comparison: None.  Findings: Supine view of the abdomen and pelvis.  Nonobstructive bowel gas pattern.  Moderate ascending colonic stool. Distal gas and stool.  No free intraperitoneal air.  No abnormal abdominal calcifications.   No appendicolith.  Vascular calcifications.  Right hemidiaphragm elevation.  IMPRESSION: No acute findings.   Original Report Authenticated By: Abigail Miyamoto, M.D.   10:31 AM Pt was seen and had physical examination.  Old charts reviewed.  Lab workup ordered. IV Fluids, IV Protonix ordered.  2:17 PM 2:17 PM No further vomiting.  Lab workup shows no significant anemia.  Will call [facility] to admit her for [facility], and [facility] re dialysis.  2:45 PM Case discussed with Dr. Inis Sizer of [facility], who accepts pt for admission to a telemetry bed.  Case discussed with Dr. Florene Glen of [facility], who will arrange her hemodialysis treatment.  1. Hematemesis   2. End stage renal failure on dialysis         Mylinda Latina III, MD 12/23/12 1446

## 2012-12-23 NOTE — H&P (Signed)
[facility] History and Physical  [name] [id number] DOB: 06-20-1970 DOA: 12/23/2012  Referring physician: Dr. Monia Pouch PCP: Benito Mccreedy, MD  Specialists:   Chief Complaint: hematemesis  HPI: [name] is a 42 y.o. female with end-stage renal disease Monday Wednesday Friday dialysis, chronic steal syndrome RUE with RUE AV fistula, and medical problems as listed below recently discharged from the hospital(on 7/8) following hospitalization with nausea vomiting thought to be secondary to UTI and? Gastroenteritis versus gastroparesis who presents with hematemesis. She states that she began vomiting dark blood about 1 AM today and had 2 episodes and came to the ED. She admits to epigastric pain. She states that prior to her last admission she had been taking Aleve and Motrin for pain in her right middle finger, and when she was discharged yesterday she was put on Percocet which is where she's been taking for pain since then. She denies melena, hematochezia and no diarrhea. She was seen in the ED and her hemoglobin was stable at 12.6, and BP stable. She was given antiemetics, PPI and she has not had any further vomiting. Was consulted per EDP and she is admitted to [facility] for further evaluation and management.  Review of Systems: The patient denies anorexia, fever, weight loss,, vision loss, decreased hearing, hoarseness, chest pain, syncope, dyspnea on exertion, peripheral edema, balance deficits, melena, hematochezia,, hematuria, incontinence, genital sores, muscle weakness, suspicious skin lesions, transient blindness, difficulty walking, depression, unusual weight change.    Past Medical History  Diagnosis Date  . Retinopathy   . Metabolic bone disease   . Anemia   . CHF (congestive heart failure)   . Pneumonia   . Blood transfusion   . Chronic kidney disease     M-W-F  . Hypertension     sees Dr. Carolin Guernsey  . Diabetes mellitus     "controlled with diet"  .  Hemodialysis patient    Past Surgical History  Procedure Laterality Date  . Insertion of dialysis catheter  10/04/2011    Procedure: INSERTION OF DIALYSIS CATHETER;  Surgeon: Angelia Mould, MD;  Location: [place];  Service: Vascular;  Laterality: Right;  insertion of dialysis catheter right internal jugular  . Av fistula placement  10/04/2011    Procedure: INSERTION OF ARTERIOVENOUS (AV) GORE-TEX GRAFT ARM;  Surgeon: Angelia Mould, MD;  Location: [place];  Service: Vascular;  Laterality: Left;  Insertion left upper arm Arteriovenous goretex graft  . Avgg removal  10/04/2011    Procedure: REMOVAL OF ARTERIOVENOUS GORETEX GRAFT ([place]);  Surgeon: Elam Dutch, MD;  Location: [place];  Service: Vascular;  Laterality: Left;  . Av fistula placement  10/29/2011    Procedure: ARTERIOVENOUS (AV) FISTULA CREATION;  Surgeon: Angelia Mould, MD;  [facility] OR;  Service: Vascular;  Laterality: Right;  Creation Right Arteriovenous Fistula   . Revison of arteriovenous fistula  06/23/2012    Procedure: REVISON OF ARTERIOVENOUS FISTULA;  Surgeon: Angelia Mould, MD;  Location: [place];  Service: Vascular;  Laterality: Right;  Ultrasound guided  . Insertion of dialysis catheter  06/23/2012    Procedure: INSERTION OF DIALYSIS CATHETER;  Surgeon: Angelia Mould, MD;  Location: [place];  Service: Vascular;  Laterality: N/A;  Ultrasound guided  . Patch angioplasty  06/23/2012    Procedure: PATCH ANGIOPLASTY;  Surgeon: Angelia Mould, MD;  Location: [facility] OR;  Service: Vascular;  Laterality: Right;   Social History:  reports that she has never smoked. She has never  used smokeless tobacco. She reports that she does not drink alcohol or use illicit drugs.  where does patient live--home   No Known Allergies  Family History  Problem Relation Age of Onset  . Malignant hyperthermia Mother   . Malignant hyperthermia Father   . Anesthesia problems Neg Hx   Brother had MD- now  deceased  Prior to Admission medications   Medication Sig Start Date End Date Taking? Authorizing Provider  amLODipine (NORVASC) 10 MG tablet Take 10 mg by mouth at bedtime.  11/14/11  Yes Historical Provider, MD  B Complex-C-Folic Acid (DIALYVITE Q000111Q PO) Take 1 tablet by mouth at bedtime.    Yes Historical Provider, MD  calcium acetate (PHOSLO) 667 MG capsule Take 1,334 mg by mouth 3 (three) times daily with meals.   Yes Historical Provider, MD  cephALEXin (KEFLEX) 500 MG capsule Take 1 capsule (500 mg total) by mouth 2 (two) times daily. 12/22/12 12/29/12 Yes Reyne Dumas, MD  cloNIDine (CATAPRES) 0.3 MG tablet Take 0.3-0.6 mg by mouth 2 (two) times daily. Take one tablet in the morning. Take two at bedtime.   Yes Historical Provider, MD  lisinopril (PRINIVIL,ZESTRIL) 20 MG tablet Take 40 mg by mouth at bedtime.    Yes Historical Provider, MD  oxyCODONE-acetaminophen (PERCOCET/ROXICET) 5-325 MG per tablet Take 1 tablet by mouth every 4 (four) hours as needed for pain. 12/18/12  Yes Shari A Upstill, PA-C  pravastatin (PRAVACHOL) 20 MG tablet Take 20 mg by mouth at bedtime.  09/11/12  Yes Historical Provider, MD   Physical Exam: Filed Vitals:   12/23/12 1500 12/23/12 1530 12/23/12 1600 12/23/12 1630  BP: 201/99 195/85 221/80 233/88  Pulse: 124 103 112 123  Temp:      TempSrc:      Resp:      Height:      Weight:      SpO2: 93% 94% 100% 100%    Constitutional: Vital signs reviewed.  Patient is a well-developed in no acute distress and cooperative with exam. Alert and oriented x3.  Head: Normocephalic and atraumatic Nose: No erythema or drainage noted.  Turbinates normal Mouth: no erythema or exudates, MMM Eyes: PERRL, EOMI, conjunctivae normal, No scleral icterus.  Neck: Supple, Trachea midline normal ROM, No JVD, mass, thyromegaly, or carotid bruit present.  Cardiovascular: RRR, S1 normal, S2 normal, no MRG, pulses symmetric and intact bilaterally Pulmonary/Chest: normal respiratory effort,  CTAB, no wheezes, rales, or rhonchi Abdominal: Soft. Epigastric tenderness, non-distended, bowel sounds are normal, no masses, organomegaly, or guarding present.  GU: no CVA tenderness  Extremities: Right middle finger with dressing clean and dry; no cyanosis and no edema Hematology: no cervical, inginal, or axillary adenopathy.  Neurological: A&O x3, Strength is normal and symmetric bilaterally, cranial nerve II-XII are grossly intact, no focal motor deficit, sensory intact to light touch bilaterally.  Skin: Warm, dry and intact. No rash, cyanosis, or clubbing.  Psychiatric: Normal mood and affect. speech and behavior is normal. Judgment and thought content normal. Cognition and memory are normal.    Labs on Admission:  Basic Metabolic Panel:  Recent Labs Lab 12/20/12 0550 12/21/12 0500 12/21/12 1341 12/22/12 0455 12/23/12 0950  NA 132* 131* 133* 135 137  K 4.0 3.9 4.1 3.6 3.6  CL 89* 90* 87* 95* 92*  CO2 23 26 22 30 27   GLUCOSE 204* 102* 110* 142* 130*  BUN 49* 52* 52* 19 27*  CREATININE 8.72* 9.66* 10.60* 5.66* 7.68*  CALCIUM 9.3 9.6 9.8 9.3 10.7*  PHOS  --   --  8.4*  --   --    Liver Function Tests:  Recent Labs Lab 12/19/12 2130 12/21/12 1341 12/22/12 0455 12/23/12 0950  AST 17  --  14 25  ALT 16  --  11 17  ALKPHOS 70  --  45 58  BILITOT 0.1*  --  0.2* 0.2*  PROT 8.3  --  6.1 7.8  ALBUMIN 4.0 3.4* 2.8* 3.6    Recent Labs Lab 12/21/12 0903 12/23/12 1130  LIPASE 31 32   No results found for this basename: AMMONIA,  in the last 168 hours CBC:  Recent Labs Lab 12/19/12 2130 12/20/12 0550 12/21/12 1341 12/23/12 0950  WBC 10.6* 10.6* 12.2* 10.9*  NEUTROABS 7.2  --   --  7.5  HGB 13.3 11.5* 11.9* 12.6  HCT 40.5 33.8* 35.0* 37.4  MCV 86.9 84.9 84.3 85.6  PLT 369 310 311 297   Cardiac Enzymes:  Recent Labs Lab 12/19/12 2320 12/21/12 0901  TROPONINI <0.30 <0.30    BNP (last 3 results) No results found for this basename: PROBNP,  in the last  8760 hours CBG:  Recent Labs Lab 12/21/12 1128 12/21/12 1818 12/21/12 2043 12/22/12 0416 12/22/12 0753  GLUCAP 124* 73 168* 150* 189*    Radiological Exams on Admission: Dg Abd 1 View  12/21/2012   *RADIOLOGY REPORT*  Clinical Data: Nausea since yesterday.  ABDOMEN - 1 VIEW  Comparison: None.  Findings: Supine view of the abdomen and pelvis.  Nonobstructive bowel gas pattern.  Moderate ascending colonic stool. Distal gas and stool.  No free intraperitoneal air.  No abnormal abdominal calcifications.   No appendicolith.  Vascular calcifications.  Right hemidiaphragm elevation.  IMPRESSION: No acute findings.   Original Report Authenticated By: Abigail Miyamoto, M.D.   Dg Abd Acute W/chest  12/23/2012   *RADIOLOGY REPORT*  Clinical Data: Hematemesis.  Abdominal pain.  ACUTE ABDOMEN SERIES (ABDOMEN 2 VIEW & CHEST 1 VIEW)  Comparison: Chest radiograph 12/20/2012 and abdominal radiograph 12/21/2012.  Findings: Frontal view of the chest shows midline trachea and normal heart size.  Lungs are clear.  No pleural fluid.  Two views of the abdomen show stool in the majority the colon.  No small bowel dilatation.  No unexpected radiopaque calculi.  IMPRESSION: Bowel gas pattern is indicative of constipation.   Original Report Authenticated By: Lorin Picket, M.D.      Assessment/Plan Active Problems: Hematemesis/upper GIB -As discussed above, NSAID induced vs Mallory-Weiss tear (patient had been vomiting-nonbloody, prior to her previous admission) -Will obtain serial hemoglobins, follow and transfuse as clinically appropriate -Place patient on PPI, cautious IV fluids as she is a dialysis patient -Have consulted GI-Dr. Silvio Pate to see patient in a.m. for possible EGD   DIABETES MELLITUS II,  -Diet controlled, we'll monitor Accu-Cheks and cover with sliding scale   HYPERTENSION, BENIGN SYSTEMIC -Resume outpatient medications with hold parameters, monitor and treat as appropriate given GI bleed as above    End stage renal disease -MWF dialysis, Renal consulted Chronic steal syndrome right arm with right upper arm AV fistula  -Noted that patient was seen by Dr. Donnetta Hutching on 7/7 during her recent hospitalization and patient is to follow up with her Vasc surgeon outpatient in [place] Urinary tract infection -Continue Keflex which she was to take through 7/15     Code Status: FULL Family Communication: None at bedside Disposition Plan: admit to tele  Time spent: >45mins  [facility] Pager [phone]  If  7PM-7AM, please contact night-coverage [email or web address] Password [email or web address] 12/23/2012, 5:30 PM

## 2012-12-23 NOTE — ED Notes (Signed)
Pt reports vomiting dark red blood since 0100. Pt reports vomited x 3 since 0100. Pt denies abdominal pain.

## 2012-12-23 NOTE — ED Notes (Signed)
IV team at bedside 

## 2012-12-23 NOTE — ED Notes (Signed)
MD at bedside. 

## 2012-12-23 NOTE — ED Notes (Signed)
IV team paged.  

## 2012-12-23 NOTE — Progress Notes (Signed)
Subjective:  In er after developing acute bloody emesis at 1:00 am this am/ yesterday dc on po Keflex for UTI (culture= 45,000 mul colonies)/ had recurrent nv was tolerating pos at dc/ saw Dr.Dew for steal syndrome  Right Upper arm avf with plans for vein mapping in am/    npw in er had bloody emesis in er earlier , stable now  bp 195/ 74 asymp. No sob today is hd day Objective Vital signs in last 24 hours: Filed Vitals:   12/23/12 1230 12/23/12 1300 12/23/12 1330 12/23/12 1400  BP: 182/75 204/81 212/79 188/71  Pulse: 100 120 106 110  Temp:      TempSrc:      Resp:      Height:      Weight:      SpO2: 93% 93% 92% 94%   Weight change:  No intake or output data in the 24 hours ending 12/23/12 1549 Labs: Basic Metabolic Panel:  Recent Labs Lab 12/21/12 1341 12/22/12 0455 12/23/12 0950  NA 133* 135 137  K 4.1 3.6 3.6  CL 87* 95* 92*  CO2 22 30 27   GLUCOSE 110* 142* 130*  BUN 52* 19 27*  CREATININE 10.60* 5.66* 7.68*  CALCIUM 9.8 9.3 10.7*  PHOS 8.4*  --   --    Liver Function Tests:  Recent Labs Lab 12/19/12 2130 12/21/12 1341 12/22/12 0455 12/23/12 0950  AST 17  --  14 25  ALT 16  --  11 17  ALKPHOS 70  --  45 58  BILITOT 0.1*  --  0.2* 0.2*  PROT 8.3  --  6.1 7.8  ALBUMIN 4.0 3.4* 2.8* 3.6    Recent Labs Lab 12/21/12 0903 12/23/12 1130  LIPASE 31 32   No results found for this basename: AMMONIA,  in the last 168 hours CBC:  Recent Labs Lab 12/19/12 2130 12/20/12 0550 12/21/12 1341 12/23/12 0950  WBC 10.6* 10.6* 12.2* 10.9*  NEUTROABS 7.2  --   --  7.5  HGB 13.3 11.5* 11.9* 12.6  HCT 40.5 33.8* 35.0* 37.4  MCV 86.9 84.9 84.3 85.6  PLT 369 310 311 297   Cardiac Enzymes:  Recent Labs Lab 12/19/12 2320 12/21/12 0901  TROPONINI <0.30 <0.30   CBG:  Recent Labs Lab 12/21/12 1128 12/21/12 1818 12/21/12 2043 12/22/12 0416 12/22/12 0753  GLUCAP 124* 73 168* 150* 189*    Iron Studies: No results found for this basename: IRON, TIBC,  TRANSFERRIN, FERRITIN,  in the last 72 hours Studies/Results: Dg Abd 1 View  12/21/2012   *RADIOLOGY REPORT*  Clinical Data: Nausea since yesterday.  ABDOMEN - 1 VIEW  Comparison: None.  Findings: Supine view of the abdomen and pelvis.  Nonobstructive bowel gas pattern.  Moderate ascending colonic stool. Distal gas and stool.  No free intraperitoneal air.  No abnormal abdominal calcifications.   No appendicolith.  Vascular calcifications.  Right hemidiaphragm elevation.  IMPRESSION: No acute findings.   Original Report Authenticated By: Abigail Miyamoto, M.D.   Dg Abd Acute W/chest  12/23/2012   *RADIOLOGY REPORT*  Clinical Data: Hematemesis.  Abdominal pain.  ACUTE ABDOMEN SERIES (ABDOMEN 2 VIEW & CHEST 1 VIEW)  Comparison: Chest radiograph 12/20/2012 and abdominal radiograph 12/21/2012.  Findings: Frontal view of the chest shows midline trachea and normal heart size.  Lungs are clear.  No pleural fluid.  Two views of the abdomen show stool in the majority the colon.  No small bowel dilatation.  No unexpected radiopaque calculi.  IMPRESSION: Bowel  gas pattern is indicative of constipation.   Original Report Authenticated By: Lorin Picket, M.D.      Physical Exam: General: Alert,thin BF, NAD Heart: RRR, no rub or mur Lungs: CTA bilat. Abdomen: BS pos. Soft, min . Tenderness all quads Extremities: Dialysis Access: No pedal edema, right 3rd finger tip bandaged not removed   Dialysis Orders: Center: [facility] on MWF  EDW 55 kg Optiflux 180 Time 3:30 / Heparin 4000 u/. Access RUA AVF / BFR 300 / DFR A 1.5/  Hectorol 1 mcg IV/HD Epogen 1000 u weekly IV/HD Venofer none / Profile #4  Tsat 37% on 6/27, PTH 170.7 in April  Problem/Plan: 1.  Upper GI  Bleed= per admit team/ no hep hd 2. Recurrent Nausea/ Vomiting-?possible Diabetic Gastroparesis= wu per Admit team/ Constipation seen on abd films. 3. Steal Syndrome / R U A aVF with Righ t3rd finger tip ulcer = Did see Dr. Lucky Cowboy  yesterdfay after [facility] dc  With plans  for vein mapping Thursday , was tolerating hd using avf 4. ESRD - HD MWF ([facility]) / use no hep.  5. Anemia - Sec to GIB and ESRD= HGB stable 12.6 no epo or iron/ fu hgb 6. Secondary hyperparathyroidism - hectorol on hd  And binders when taking pos 7. HTN/volume -  Vol . Okay with CXR/ bp ^ meds IV /was dc yesterday with new lower edw 55.0 8. DM type 2 - per admit team  Ernest Haber, PA-C [facility] [phone] 12/23/2012,3:49 PM  LOS: 0 days  As above.  WIll plan for her dialysis today. [name] C

## 2012-12-24 ENCOUNTER — Encounter (HOSPITAL_COMMUNITY): Payer: Self-pay | Admitting: Gastroenterology

## 2012-12-24 ENCOUNTER — Encounter (HOSPITAL_COMMUNITY)
Admission: EM | Disposition: A | Discharge status: Home / Self Care | Payer: Self-pay | Source: Home / Self Care | Attending: Family Medicine

## 2012-12-24 DIAGNOSIS — E43 Unspecified severe protein-calorie malnutrition: Secondary | ICD-10-CM | POA: Insufficient documentation

## 2012-12-24 DIAGNOSIS — K920 Hematemesis: Principal | ICD-10-CM

## 2012-12-24 DIAGNOSIS — K253 Acute gastric ulcer without hemorrhage or perforation: Secondary | ICD-10-CM

## 2012-12-24 DIAGNOSIS — K2990 Gastroduodenitis, unspecified, without bleeding: Secondary | ICD-10-CM

## 2012-12-24 DIAGNOSIS — K2970 Gastritis, unspecified, without bleeding: Secondary | ICD-10-CM

## 2012-12-24 DIAGNOSIS — K259 Gastric ulcer, unspecified as acute or chronic, without hemorrhage or perforation: Secondary | ICD-10-CM

## 2012-12-24 HISTORY — PX: ESOPHAGOGASTRODUODENOSCOPY: SHX5428

## 2012-12-24 LAB — RENAL FUNCTION PANEL
Albumin: 3.1 g/dL — ABNORMAL LOW (ref 3.5–5.2)
BUN: 11 mg/dL (ref 6–23)
CO2: 32 mEq/L (ref 19–32)
Calcium: 9.6 mg/dL (ref 8.4–10.5)
Chloride: 97 mEq/L (ref 96–112)
Creatinine, Ser: 4.48 mg/dL — ABNORMAL HIGH (ref 0.50–1.10)
GFR calc Af Amer: 13 mL/min — ABNORMAL LOW (ref >90)
GFR calc non Af Amer: 11 mL/min — ABNORMAL LOW (ref >90)
Glucose, Bld: 92 mg/dL (ref 70–99)
Phosphorus: 3.7 mg/dL (ref 2.3–4.6)
Potassium: 4.2 mEq/L (ref 3.5–5.1)
Sodium: 141 mEq/L (ref 135–145)

## 2012-12-24 LAB — CBC
HCT: 35.1 % — ABNORMAL LOW (ref 36.0–46.0)
Hemoglobin: 11.8 g/dL — ABNORMAL LOW (ref 12.0–15.0)
MCH: 29.1 pg (ref 26.0–34.0)
MCHC: 33.6 g/dL (ref 30.0–36.0)
MCV: 86.5 fL (ref 78.0–100.0)
Platelets: 233 10*3/uL (ref 150–400)
RBC: 4.06 MIL/uL (ref 3.87–5.11)
RDW: 13.8 % (ref 11.5–15.5)
WBC: 6.8 10*3/uL (ref 4.0–10.5)

## 2012-12-24 LAB — GLUCOSE, CAPILLARY
Glucose-Capillary: 93 mg/dL (ref 70–99)
Glucose-Capillary: 83 mg/dL (ref 70–99)
Glucose-Capillary: 144 mg/dL — ABNORMAL HIGH (ref 70–99)

## 2012-12-24 SURGERY — EGD (ESOPHAGOGASTRODUODENOSCOPY)
Anesthesia: Moderate Sedation

## 2012-12-24 MED ORDER — PENTAFLUOROPROP-TETRAFLUOROETH EX AERO: 1.0000 "application " | INHALATION_SPRAY | CUTANEOUS | Status: DC | PRN

## 2012-12-24 MED ORDER — SODIUM CHLORIDE 0.9 % IV SOLN: 100.0000 mL | INTRAVENOUS | Status: DC | PRN

## 2012-12-24 MED ORDER — PANTOPRAZOLE SODIUM 40 MG PO TBEC
40.0000 mg | DELAYED_RELEASE_TABLET | Freq: Two times a day (BID) | ORAL | Status: DC
  Administered 2012-12-24 – 2012-12-25 (×2): 40 mg via ORAL
  Filled 2012-12-24: qty 1

## 2012-12-24 MED ORDER — LIDOCAINE HCL (PF) 1 % IJ SOLN: 5.0000 mL | INTRAMUSCULAR | Status: DC | PRN

## 2012-12-24 MED ORDER — HEPARIN SODIUM (PORCINE) 1000 UNIT/ML DIALYSIS
20.0000 [IU]/kg | INTRAMUSCULAR | Status: DC | PRN
  Filled 2012-12-24: qty 2

## 2012-12-24 MED ORDER — BUTAMBEN-TETRACAINE-BENZOCAINE 2-2-14 % EX AERO
INHALATION_SPRAY | CUTANEOUS | Status: DC | PRN
  Administered 2012-12-24: 2 via TOPICAL

## 2012-12-24 MED ORDER — LIDOCAINE-PRILOCAINE 2.5-2.5 % EX CREA: 1.0000 "application " | TOPICAL_CREAM | CUTANEOUS | Status: DC | PRN

## 2012-12-24 MED ORDER — NEPRO/CARBSTEADY PO LIQD: 237.0000 mL | ORAL | Status: DC | PRN

## 2012-12-24 MED ORDER — FENTANYL CITRATE 0.05 MG/ML IJ SOLN
INTRAMUSCULAR | Status: DC | PRN
  Administered 2012-12-24 (×2): 25 ug via INTRAVENOUS

## 2012-12-24 MED ORDER — HEPARIN SODIUM (PORCINE) 1000 UNIT/ML DIALYSIS
1000.0000 [IU] | INTRAMUSCULAR | Status: DC | PRN
  Filled 2012-12-24: qty 1

## 2012-12-24 MED ORDER — ALTEPLASE 2 MG IJ SOLR
2.0000 mg | Freq: Once | INTRAMUSCULAR | Status: AC | PRN
  Filled 2012-12-24: qty 2

## 2012-12-24 MED ORDER — SODIUM CHLORIDE 0.9 % IV SOLN: INTRAVENOUS | Status: DC

## 2012-12-24 MED ORDER — MIDAZOLAM HCL 10 MG/2ML IJ SOLN
INTRAMUSCULAR | Status: DC | PRN
  Administered 2012-12-24 (×3): 2 mg via INTRAVENOUS

## 2012-12-24 NOTE — Progress Notes (Signed)
PROGRESS NOTE  [name] [id number] DOB: 02-Jun-1971 DOA: 12/23/2012 PCP: Benito Mccreedy, MD  Brief narrative: 42 yr old AAF, known ESRD M/W/, Chronic steal RUE c R Av fistula re-admitted for UGIB/? Mallory-Weiss tear  Past medical history-As per Problem list  Consultants:  Nephrology  GI  Procedures:  Endoscopy 7/10=distal gastritis, benign clean based pyloric channel ulcer-biopsies taqken  Antibiotics:   none   Subjective  Well.  Was npo when seen by me.  NO othe rissue sother then feels hungry   Objective    Interim History:   Telemetry: nad  Objective: Filed Vitals:   12/24/12 1341 12/24/12 1350 12/24/12 1400 12/24/12 1616  BP: 153/78 163/82 163/82 182/69  Pulse: 95   107  Temp: 98.2 F (36.8 C)   98.7 F (37.1 C)  TempSrc: Oral   Oral  Resp: 20 12 12 14   Height:      Weight:      SpO2: 100% 94% 90% 98%    Intake/Output Summary (Last 24 hours) at 12/24/12 1629 Last data filed at 12/24/12 [id number]  Gross per 24 hour  Intake      0 ml  Output   1794 ml  Net  -1794 ml    Exam:  General: alert, pleasant oriented, NAd Cardiovascular: s1 s2 no m/r/g Respiratory: clear Abdomen: soft/NT/ND Skinno rash.  Tattoe RUE Neurointact  Data Reviewed: Basic Metabolic Panel:  Recent Labs Lab 12/21/12 0500 12/21/12 1341 12/22/12 0455 12/23/12 0950 12/24/12 0455  NA 131* 133* 135 137 141  K 3.9 4.1 3.6 3.6 4.2  CL 90* 87* 95* 92* 97  CO2 26 22 30 27  32  GLUCOSE 102* 110* 142* 130* 92  BUN 52* 52* 19 27* 11  CREATININE 9.66* 10.60* 5.66* 7.68* 4.48*  CALCIUM 9.6 9.8 9.3 10.7* 9.6  PHOS  --  8.4*  --   --  3.7   Liver Function Tests:  Recent Labs Lab 12/19/12 2130 12/21/12 1341 12/22/12 0455 12/23/12 0950 12/24/12 0455  AST 17  --  14 25  --   ALT 16  --  11 17  --   ALKPHOS 70  --  45 58  --   BILITOT 0.1*  --  0.2* 0.2*  --   PROT 8.3  --  6.1 7.8  --   ALBUMIN 4.0 3.4* 2.8* 3.6 3.1*    Recent Labs Lab 12/21/12 0903  12/23/12 1130  LIPASE 31 32   No results found for this basename: AMMONIA,  in the last 168 hours CBC:  Recent Labs Lab 12/19/12 2130 12/20/12 0550 12/21/12 1341 12/23/12 0950 12/23/12 1748 12/23/12 2320 12/24/12 0455  WBC 10.6* 10.6* 12.2* 10.9* 8.9  --  6.8  NEUTROABS 7.2  --   --  7.5  --   --   --   HGB 13.3 11.5* 11.9* 12.6 11.5* 12.3 11.8*  HCT 40.5 33.8* 35.0* 37.4 34.5* 36.3 35.1*  MCV 86.9 84.9 84.3 85.6 85.0  --  86.5  PLT 369 310 311 297 297  --  233   Cardiac Enzymes:  Recent Labs Lab 12/19/12 2320 12/21/12 0901  TROPONINI <0.30 <0.30   BNP: No components found with this basename: POCBNP,  CBG:  Recent Labs Lab 12/22/12 0753 12/23/12 2208 12/24/12 0328 12/24/12 0755 12/24/12 1538  GLUCAP 189* 73 93 83 144*    Recent Results (from the past 240 hour(s))  URINE CULTURE     Status: None   Collection Time    12/20/12  2:09 AM      Result Value Range Status   Specimen Description URINE, CLEAN CATCH   Final   Special Requests CX ADDED AT 0233 ON [id number]   Final   Culture  Setup Time 12/20/2012 02:44   Final   Colony Count 45,000 COLONIES/ML   Final   Culture     Final   Value: Multiple bacterial morphotypes present, none predominant. Suggest appropriate recollection if clinically indicated.   Report Status 12/21/2012 FINAL   Final  MRSA PCR SCREENING     Status: None   Collection Time    12/20/12  6:27 AM      Result Value Range Status   MRSA by PCR NEGATIVE  NEGATIVE Final   Comment:            The GeneXpert MRSA Assay (FDA     approved for NASAL specimens     only), is one component of a     comprehensive MRSA colonization     surveillance program. It is not     intended to diagnose MRSA     infection nor to guide or     monitor treatment for     MRSA infections.     Studies:              All Imaging reviewed and is as per above notation   Scheduled Meds: . sodium chloride   Intravenous STAT  . amLODipine  10 mg Oral QHS  .  cloNIDine  0.3 mg Oral BID  . [START ON 12/25/2012] doxercalciferol  1 mcg Intravenous Q M,W,F-HD  . insulin aspart  0-5 Units Subcutaneous QHS  . insulin aspart  0-9 Units Subcutaneous TID WC  . lisinopril  40 mg Oral QHS  . pantoprazole  40 mg Oral BID AC  . simvastatin  10 mg Oral q1800  . sodium chloride  3 mL Intravenous Q12H   Continuous Infusions:    Assessment/Plan: 1. Hematemesis/upper GIB -gastritis + Pylroic ulcer-H Pylori pending-monitor Hb.  staretd bland diet.  Follow Serology for H Pylo.  If all stable likely d/c am 7/11.  Continue BID Pantoprazole 40 2. Chronic steal syndrome right arm with right upper arm AV fistula -Noted that patient was seen by Dr. Donnetta Hutching on 7/7 during her recent hospitalization and patient is to follow up with her Vasc surgeon outpatient in [place] on 7/17 for surger 3. DIABETES MELLITUS II-Diet controlled.  Accu-Cheks 73-144 4. HYPERTENSION, BENIGN SYSTEMIC -Resume lisinopril 40 qhs, clonidine 0.3 bid, Amlodipine 10 qhs  5. End stage renal disease -MWF dialysis, Renal input appreciated 6. Metabolic bone disease-continue Doxercalciferol 1 mcg M/W/F 7. Urinary tract infection -Continue Keflex which she was to take through 7/15 8. Hld Continue Zocor 10 daily   Code Status: Full Family Communication: none at bedside Disposition Plan: Inpatient   Verneita Griffes, MD  [facility] Pager [phone] 12/24/2012, 4:29 PM    LOS: 1 day

## 2012-12-24 NOTE — Interval H&P Note (Signed)
History and Physical Interval Note:  12/24/2012 12:57 PM  [name]  has presented today for surgery, with the diagnosis of hemetemesis  The various methods of treatment have been discussed with the patient and family. After consideration of risks, benefits and other options for treatment, the patient has consented to  Procedure(s): ESOPHAGOGASTRODUODENOSCOPY (EGD) (N/A) as a surgical intervention .  The patient's history has been reviewed, patient examined, no change in status, stable for surgery.  I have reviewed the patient's chart and labs.  Questions were answered to the patient's satisfaction.     Owens Loffler

## 2012-12-24 NOTE — Progress Notes (Signed)
INITIAL NUTRITION ASSESSMENT  DOCUMENTATION CODES Per approved criteria  -Severe malnutrition in the context of acute illness or injury   INTERVENTION: 1. Add Nepro Shake po BID, each supplement provides 425 kcal and 19 grams protein. Once diet has been advanced  NUTRITION DIAGNOSIS: Inadequate oral intake related to N/V as evidenced by weight loss.   Goal: PO intake to meet >/=90% estimated nutrition needs.   Monitor:  Po intake/diet advance, weight trends, labs  Reason for Assessment: Malnutrition Screening Tool  42 y.o. female  Admitting Dx: Hematemesis   ASSESSMENT: Pt just d/c'd on 7/8 from this hospital for N/V, gastroparesis vs gastroenteritis. Pt presented back with hematemesis.  Pt states that prior to this episode she usually weighs about 125 lbs, now down to 118 lbs. Reports she has not been able to keep any foods down for almost a week.  Weight hx confirms pt with 10 lb weight loss over the past 3 months (7.8% body weight) Pt drinks Nepro shakes at home, will add once able to advance diet.   Pt meets criteria for severe malnutrition in the context of acute illness 2/2 weight loss of 7.8% body weight in 3 months per weight hx and meeting </=50% estimated nutrient needs for >/=5 days.   Height: Ht Readings from Last 1 Encounters:  12/23/12 5\' 2"  (1.575 m)    Weight: Wt Readings from Last 1 Encounters:  12/23/12 118 lb 6.2 oz (53.7 kg)    Ideal Body Weight: 110 lbs   % Ideal Body Weight: 107%  Wt Readings from Last 10 Encounters:  12/23/12 118 lb 6.2 oz (53.7 kg)  12/21/12 120 lb 13 oz (54.8 kg)  09/22/12 128 lb 8.5 oz (58.3 kg)  07/22/12 118 lb 13.3 oz (53.9 kg)  07/08/12 122 lb 12.7 oz (55.7 kg)  07/01/12 125 lb 1.6 oz (56.745 kg)  06/22/12 124 lb 1.9 oz (56.3 kg)  06/22/12 124 lb 1.9 oz (56.3 kg)  05/19/12 123 lb (55.792 kg)  04/29/12 123 lb (55.792 kg)    Usual Body Weight: 125 lbs (56.8 kg recently)  EDW 55kg per Renal  % Usual Body Weight:  88%  BMI:  Body mass index is 21.65 kg/(m^2). WNL   Estimated Nutritional Needs: Kcal: 1700-1900 Protein: 65-75 gm  Fluid: 1.2 L  Skin: intact  Diet Order: NPO  EDUCATION NEEDS: -No education needs identified at this time   Intake/Output Summary (Last 24 hours) at 12/24/12 0905 Last data filed at 12/24/12 0643  Gross per 24 hour  Intake      0 ml  Output   1794 ml  Net  -1794 ml    Last BM: PTA    Labs:   Recent Labs Lab 12/21/12 1341 12/22/12 0455 12/23/12 0950 12/24/12 0455  NA 133* 135 137 141  K 4.1 3.6 3.6 4.2  CL 87* 95* 92* 97  CO2 22 30 27  32  BUN 52* 19 27* 11  CREATININE 10.60* 5.66* 7.68* 4.48*  CALCIUM 9.8 9.3 10.7* 9.6  PHOS 8.4*  --   --  3.7  GLUCOSE 110* 142* 130* 92    CBG (last 3)   Recent Labs  12/23/12 2208 12/24/12 0328 12/24/12 0755  GLUCAP 73 93 83    Scheduled Meds: . sodium chloride   Intravenous STAT  . amLODipine  10 mg Oral QHS  . cephALEXin  500 mg Oral Q12H  . cloNIDine  0.3 mg Oral BID  . [START ON 12/25/2012] doxercalciferol  1 mcg Intravenous  Q M,W,F-HD  . insulin aspart  0-5 Units Subcutaneous QHS  . insulin aspart  0-9 Units Subcutaneous TID WC  . lisinopril  40 mg Oral QHS  . pantoprazole (PROTONIX) IV  40 mg Intravenous Q12H  . simvastatin  10 mg Oral q1800  . sodium chloride  3 mL Intravenous Q12H    Continuous Infusions:   Past Medical History  Diagnosis Date  . Retinopathy   . Metabolic bone disease   . Anemia   . CHF (congestive heart failure)   . Pneumonia   . Blood transfusion   . Chronic kidney disease     M-W-F  . Hypertension     sees Dr. Carolin Guernsey  . Diabetes mellitus     "controlled with diet"  . Hemodialysis patient     Past Surgical History  Procedure Laterality Date  . Insertion of dialysis catheter  10/04/2011    Procedure: INSERTION OF DIALYSIS CATHETER;  Surgeon: Angelia Mould, MD;  Location: [place];  Service: Vascular;  Laterality: Right;  insertion of dialysis  catheter right internal jugular  . Av fistula placement  10/04/2011    Procedure: INSERTION OF ARTERIOVENOUS (AV) GORE-TEX GRAFT ARM;  Surgeon: Angelia Mould, MD;  Location: [place];  Service: Vascular;  Laterality: Left;  Insertion left upper arm Arteriovenous goretex graft  . Avgg removal  10/04/2011    Procedure: REMOVAL OF ARTERIOVENOUS GORETEX GRAFT ([place]);  Surgeon: Elam Dutch, MD;  Location: [place];  Service: Vascular;  Laterality: Left;  . Av fistula placement  10/29/2011    Procedure: ARTERIOVENOUS (AV) FISTULA CREATION;  Surgeon: Angelia Mould, MD;  Location[facility];  Service: Vascular;  Laterality: Right;  Creation Right Arteriovenous Fistula   . Revison of arteriovenous fistula  06/23/2012    Procedure: REVISON OF ARTERIOVENOUS FISTULA;  Surgeon: Angelia Mould, MD;  Location: [place];  Service: Vascular;  Laterality: Right;  Ultrasound guided  . Insertion of dialysis catheter  06/23/2012    Procedure: INSERTION OF DIALYSIS CATHETER;  Surgeon: Angelia Mould, MD;  Location: [place];  Service: Vascular;  Laterality: N/A;  Ultrasound guided  . Patch angioplasty  06/23/2012    Procedure: PATCH ANGIOPLASTY;  Surgeon: Angelia Mould, MD;  Location: [place];  Service: Vascular;  Laterality: Right;    Orson Slick RD, LDN Pager [phone] After Hours pager [phone]

## 2012-12-24 NOTE — Consult Note (Signed)
[facility] Consult: 10:45 AM 12/24/2012   Referring Provider: Dr Philipp Deputy Primary Care Physician:  Benito Mccreedy, MD Primary Gastroenterologist: none   Reason for Consultation:  hematemesis  HPI: [name] is a 42 y.o. female.   ESRD since spring 2013. Hypertension. Right arm steal syndrome with worsening pain, surgery for this planned 7/17.    Admitted 7/5 - 7/8 for nausea and vomiting that started on 6/31 after she got the second, in series of 3, hepatitis B shots.  The emesis was bilious, not bloody.  She was diagnosed with dehydration and UTI (?) (grew out 45 k of multiple species) and question of gastroenteritis.  She had been using 4 Aleve, and 4 Motrin daily for 7 to 10 days because of steal syndrom related right arm pain.  Nausea resolved during hospital stay but she had hemetemesis at 0100 on 7/9.  There has been no significant decline in Hgb  Previous to above, pt did not have GI issues.  She is not passing blood per rectum.  No dysphagia.  Sugars well controlled without meds.  Never had an EGD or colonoscopy.  Generally eats well.  Last BM was on 7/4.  Normal pattern is q day to qod BMs but generally doesn't use laxative.   Per nutritionist note pt is malnourished.    Past Medical History  Diagnosis Date  . Retinopathy   . Metabolic bone disease   . Anemia   . CHF (congestive heart failure)   . Pneumonia   . Blood transfusion   . Chronic kidney disease     M-W-F  . Hypertension     sees Dr. Carolin Guernsey  . Diabetes mellitus     "controlled with diet"  . Hemodialysis patient     Past Surgical History  Procedure Laterality Date  . Insertion of dialysis catheter  10/04/2011    Procedure: INSERTION OF DIALYSIS CATHETER;  Surgeon: Angelia Mould, MD;  Location: [place];  Service: Vascular;  Laterality: Right;  insertion of dialysis catheter right internal jugular  . Av fistula placement  10/04/2011     Procedure: INSERTION OF ARTERIOVENOUS (AV) GORE-TEX GRAFT ARM;  Surgeon: Angelia Mould, MD;  Location: [place];  Service: Vascular;  Laterality: Left;  Insertion left upper arm Arteriovenous goretex graft  . Avgg removal  10/04/2011    Procedure: REMOVAL OF ARTERIOVENOUS GORETEX GRAFT ([place]);  Surgeon: Elam Dutch, MD;  Location: [place];  Service: Vascular;  Laterality: Left;  . Av fistula placement  10/29/2011    Procedure: ARTERIOVENOUS (AV) FISTULA CREATION;  Surgeon: Angelia Mould, MD;  Location: [facility] OR;  Service: Vascular;  Laterality: Right;  Creation Right Arteriovenous Fistula   . Revison of arteriovenous fistula  06/23/2012    Procedure: REVISON OF ARTERIOVENOUS FISTULA;  Surgeon: Angelia Mould, MD;  Location: [place];  Service: Vascular;  Laterality: Right;  Ultrasound guided  . Insertion of dialysis catheter  06/23/2012    Procedure: INSERTION OF DIALYSIS CATHETER;  Surgeon: Angelia Mould, MD;  Location: [place];  Service: Vascular;  Laterality: N/A;  Ultrasound guided  . Patch angioplasty  06/23/2012    Procedure: PATCH ANGIOPLASTY;  Surgeon: Angelia Mould, MD;  Location: [place];  Service: Vascular;  Laterality: Right;    Prior to Admission medications   Medication Sig Start Date End Date Taking? Authorizing Provider  amLODipine (NORVASC) 10 MG tablet Take 10 mg by mouth at bedtime.  11/14/11  Yes Historical Provider, MD  B  Complex-C-Folic Acid (DIALYVITE Q000111Q PO) Take 1 tablet by mouth at bedtime.    Yes Historical Provider, MD  calcium acetate (PHOSLO) 667 MG capsule Take 1,334 mg by mouth 3 (three) times daily with meals.   Yes Historical Provider, MD  cephALEXin (KEFLEX) 500 MG capsule Take 1 capsule (500 mg total) by mouth 2 (two) times daily. 12/22/12 12/29/12 Yes Reyne Dumas, MD  cloNIDine (CATAPRES) 0.3 MG tablet Take 0.3-0.6 mg by mouth 2 (two) times daily. Take one tablet in the morning. Take two at bedtime.   Yes Historical Provider, MD  lisinopril  (PRINIVIL,ZESTRIL) 20 MG tablet Take 40 mg by mouth at bedtime.    Yes Historical Provider, MD  oxyCODONE-acetaminophen (PERCOCET/ROXICET) 5-325 MG per tablet Take 1 tablet by mouth every 4 (four) hours as needed for pain. 12/18/12  Yes Shari A Upstill, PA-C  pravastatin (PRAVACHOL) 20 MG tablet Take 20 mg by mouth at bedtime.  09/11/12  Yes Historical Provider, MD    Scheduled Meds: . sodium chloride   Intravenous STAT  . amLODipine  10 mg Oral QHS  . cephALEXin  500 mg Oral Q12H  . cloNIDine  0.3 mg Oral BID  . [START ON 12/25/2012] doxercalciferol  1 mcg Intravenous Q M,W,F-HD  . insulin aspart  0-5 Units Subcutaneous QHS  . insulin aspart  0-9 Units Subcutaneous TID WC  . lisinopril  40 mg Oral QHS  . pantoprazole (PROTONIX) IV  40 mg Intravenous Q12H  . simvastatin  10 mg Oral q1800  . sodium chloride  3 mL Intravenous Q12H   Infusions:   PRN Meds: acetaminophen, acetaminophen, oxyCODONE-acetaminophen   Allergies as of 12/23/2012  . (No Known Allergies)    Family History  Problem Relation Age of Onset  . Malignant hyperthermia Mother   . Malignant hyperthermia Father   . Anesthesia problems Neg Hx     Dad with hx PUD  History   Social History  . Marital Status: Single    Spouse Name: N/A    Number of Children: N/A  . Years of Education: N/A   Occupational History  . Disabled.    Social History Main Topics  . Smoking status: Never Smoker   . Smokeless tobacco: Never Used  . Alcohol Use: No  . Drug Use: No  . Sexually Active: No   Other Topics Concern  . Not on file   Social History Narrative  . No literacy problems    REVIEW OF SYSTEMS: Constitutional:  Feels weak, but this is not chronic ENT:  No nose bleeds Pulm:  No SOB or cough CV:  No palpitations or chest pain GU:  Still urinates, no blood in urine. No dysuria GI:  Per HPI.  No hx abnormal LFTs Heme:  No anemia.    Transfusions:  none Neuro:  Some headaches Derm:  Sore on right  finger Endocrine:  No excessive thirst.  No sweats or chills Immunization:  Up to date.  Hep B shot 2 of three 10 days ago Travel:  None.   PHYSICAL EXAM: Vital signs in last 24 hours: Temp:  [98.1 F (36.7 C)-99.1 F (37.3 C)] 98.5 F (36.9 C) (07/10 0915) Pulse Rate:  [88-132] 89 (07/10 0915) Resp:  [8-24] 17 (07/10 0915) BP: (104-233)/(55-106) 167/70 mmHg (07/10 0915) SpO2:  [91 %-100 %] 97 % (07/10 0915) Weight:  [52.8 kg (116 lb 6.5 oz)-54.6 kg (120 lb 5.9 oz)] 53.7 kg (118 lb 6.2 oz) (07/09 2209)  General: pleasant, alert, comfortable AAF Head:  No swelling or asymmetry  Eyes:  No icterus or pallor Ears:  Not HOH  Nose:  No discharge Mouth:  Clear, moist oral MM Neck:  No mass or JVD Lungs:  Clear.  No cough or dyspnea Heart: RRR.  No MRG Abdomen:  Soft, not tender.  Not distended.  BS hypoactive.   Rectal: not done   Musc/Skeltl: no joint deformity Extremities:  AVG in right upper arm.  No pedal edema  Neurologic:  No tremor.  No gross deficits.  Excellent historian.  Oriented x 3. Skin:  No rash or sores Tattoos:  On both upper arms (from [place]) Nodes:  No adenopthy in neck or groin   Psych:  Pleasant, not agitated or depressed.   Intake/Output from previous day: 07/09 0701 - 07/10 0700 In: 0  Out: 1794  Intake/Output this shift:    LAB RESULTS:  Recent Labs  12/23/12 0950 12/23/12 1748 12/23/12 2320 12/24/12 0455  WBC 10.9* 8.9  --  6.8  HGB 12.6 11.5* 12.3 11.8*  HCT 37.4 34.5* 36.3 35.1*  PLT 297 297  --  233   BMET Lab Results  Component Value Date   NA 141 12/24/2012   NA 137 12/23/2012   NA 135 12/22/2012   K 4.2 12/24/2012   K 3.6 12/23/2012   K 3.6 12/22/2012   CL 97 12/24/2012   CL 92* 12/23/2012   CL 95* 12/22/2012   CO2 32 12/24/2012   CO2 27 12/23/2012   CO2 30 12/22/2012   GLUCOSE 92 12/24/2012   GLUCOSE 130* 12/23/2012   GLUCOSE 142* 12/22/2012   BUN 11 12/24/2012   BUN 27* 12/23/2012   BUN 19 12/22/2012   CREATININE 4.48* 12/24/2012    CREATININE 7.68* 12/23/2012   CREATININE 5.66* 12/22/2012   CALCIUM 9.6 12/24/2012   CALCIUM 10.7* 12/23/2012   CALCIUM 9.3 12/22/2012   LFT  Recent Labs  12/22/12 0455 12/23/12 0950 12/24/12 0455  PROT 6.1 7.8  --   ALBUMIN 2.8* 3.6 3.1*  AST 14 25  --   ALT 11 17  --   ALKPHOS 45 58  --   BILITOT 0.2* 0.2*  --    PT/INR Lab Results  Component Value Date   INR 0.89 06/23/2012   Hepatitis Panel  Recent Labs  12/21/12 1341  HEPBSAG NEGATIVE   C-Diff No components found with this basename: cdiff    Drugs of Abuse  No results found for this basename: labopia, cocainscrnur, labbenz, amphetmu, thcu, labbarb     RADIOLOGY STUDIES: Dg Abd Acute W/chest  12/23/2012    IMPRESSION: Bowel gas pattern is indicative of constipation.   Original Report Authenticated By: Lorin Picket, M.D.    ENDOSCOPIC STUDIES: None ever  IMPRESSION: *  Hemetemesis.  Rule out MW tear, rule out ulcer in pt who was taking NSAIds until 6 days ago *   ESRD.  Dialyzes MWF *  Right UE steal syndrome, right arm pain.  For surgery to address this on 7/17 *  Hypertension.   *  Constipation.  *  UTI???.  The growth last week was not indicitive of a UTI, repeat U/A is full of squamous cells, WBCs normal.  she is still getting Keflex however.  *  Malnutrition per RD  PLAN: *  EGD.  Pt agreeable to proceed.  *  Miralax for constipation.  *  Stop Keflex. Get a clean catch urine on clean catch urine.     LOS: 1 day  Azucena Freed  12/24/2012, 10:45 AM Pager: [phone]     ________________________________________________________________________  Velora Heckler GI MD note:  I personally examined the patient, reviewed the data and agree with the assessment and plan described above. EGD today.   Owens Loffler, MD [facility] Pager [phone]

## 2012-12-24 NOTE — Op Note (Signed)
[facility] [place], [address]   ENDOSCOPY PROCEDURE REPORT  PATIENT: [name], [name]  MR#: [id number] BIRTHDATE: 03-13-1971 , 42  yrs. old GENDER: Female ENDOSCOPIST: Milus Banister, MD PROCEDURE DATE:  12/24/2012 PROCEDURE:  EGD w/ biopsy ASA CLASS:     Class III INDICATIONS:  vomiting, mild hematemesis. MEDICATIONS: Fentanyl 50 mcg IV and Versed 6 mg IV TOPICAL ANESTHETIC: Cetacaine Spray  DESCRIPTION OF PROCEDURE: After the risks benefits and alternatives of the procedure were thoroughly explained, informed consent was obtained.  The Pentax Gastroscope [id number] endoscope was introduced through the mouth and advanced to the second portion of the duodenum. Without limitations.  The instrument was slowly withdrawn as the mucosa was fully examined.    There was moderate gastritis distally.  There was also a small, benign appearing, clean based pyloric channel ulcer.  Biopsies were taken from stomach and sent to pathology.  The examination was otherwise normal.  Retroflexed views revealed no abnormalities. The scope was then withdrawn from the patient and the procedure completed. COMPLICATIONS: There were no complications.  ENDOSCOPIC IMPRESSION: There was moderate gastritis distally.  There was also a small, benign appearing, clean based pyloric channel ulcer.  Biopsies were taken from stomach and sent to pathology.  The examination was otherwise normal.  RECOMMENDATIONS: She should stay on twice daily PPI (orally) for at least 5-6 weeks. My office will set up return visit in 4-5 weeks with me to check on her symptoms, likely schedule repeat EGD to check for pyloric channel ulcer healing.  If biopsies show H.  pylori, she will be started on appropriate antibiotics.  OK to d/c home today or tomorrow AM from my perspective.   eSigned:  Milus Banister, MD 12/24/2012 1:28 PM

## 2012-12-24 NOTE — Clinical Documentation Improvement (Addendum)
THIS DOCUMENT IS NOT A PERMANENT PART OF THE MEDICAL RECORD  Please update your documentation with the medical record to reflect your response to this query. If you need help knowing how to do this please call [phone].  12/24/12   Dear Dr. Verlon Au / Associates,  In a better effort to capture your patient's severity of illness, reflect appropriate length of stay and utilization of resources, a review of the patient medical record has revealed the following indicators.    Based on your clinical judgment, please clarify and document in a progress note and/or discharge summary the clinical condition associated with the following supporting information:  In responding to this query please exercise your independent judgment.  The fact that a query is asked, does not imply that any particular answer is desired or expected.  Possible Clinical Conditions?   Agree patient has severe malnutrition  Supporting Information: "Pt meets criteria for severe malnutrition in the context of acute illness 2/2 weight loss of 7.8% body weight in 3 months per weight hx and meeting </=50% estimated nutrient needs for >/=5 days."  Nutrition [id number] by Sherre Scarlet, RD  DOCUMENTATION CODES Per approved criteria -Severe malnutrition in the context of acute illness or injury  INTERVENTION:   1. Add Nepro Shake po BID, each supplement provides 425 kcal and 19 grams protein. Once diet has been advanced   You may use possible, probable, or suspect with inpatient documentation. possible, probable, suspected diagnoses MUST be documented at the time of discharge  Reviewed: additional documentation in the medical record  Thank You,  Alessandra Grout RN, BSN, CCDS Clinical Documentation Specialist: [phone] Cell=[phone] [place]

## 2012-12-24 NOTE — Clinical Documentation Improvement (Addendum)
THIS DOCUMENT IS NOT A PERMANENT PART OF THE MEDICAL RECORD  Please update your documentation with the medical record to reflect your response to this query. If you need help knowing how to do this please call [phone].  12/24/12  Dear Dr. [facility],  In a better effort to capture your patient's severity of illness, reflect appropriate length of stay and utilization of resources, a review of the patient medical record has revealed the following indicators the diagnosis of Heart Failure.    Based on your clinical judgment, please clarify and document in a progress note and/or discharge summary the clinical condition associated with the following supporting information:  In responding to this query please exercise your independent judgment.  The fact that a query is asked, does not imply that any particular answer is desired or expected.  Possible Clinical Conditions?   Chronic Systolic Congestive Heart Failure Chronic Diastolic Congestive Heart Failure Chronic Systolic & Diastolic Congestive Heart Failure Acute Systolic Congestive Heart Failure Acute Diastolic Congestive Heart Failure Acute Systolic & Diastolic Congestive Heart Failure Acute on Chronic Systolic Congestive Heart Failure Acute on Chronic Diastolic Congestive Heart Failure Acute on Chronic Systolic & Diastolic  Congestive Heart Failure Other Condition_________ Cannot Clinically Determine  Supporting Information:(As per notes)  "Pt has Hx of CHF"  unknown whether patient has chf  Thank You,  Alessandra Grout RN, BSN, CCDS  Clinical Documentation Specialist: [phone] [place]

## 2012-12-24 NOTE — Progress Notes (Signed)
Subjective: Interval History: NPO for endo today. Objective: Vital signs in last 24 hours: Temp:  [98.1 F (36.7 C)-99.1 F (37.3 C)] 98.5 F (36.9 C) (07/10 0915) Pulse Rate:  [88-132] 89 (07/10 0915) Resp:  [8-24] 17 (07/10 0915) BP: (104-233)/(55-106) 167/70 mmHg (07/10 0915) SpO2:  [91 %-100 %] 97 % (07/10 0915) Weight:  [52.8 kg (116 lb 6.5 oz)-54.6 kg (120 lb 5.9 oz)] 53.7 kg (118 lb 6.2 oz) (07/09 2209) Weight change:   Intake/Output from previous day: 07/09 0701 - 07/10 0700 In: 0  Out: 1794  Intake/Output this shift:    General appearance: alert and cooperative Resp: clear to auscultation bilaterally Chest wall: no tenderness Cardio: regular rate and rhythm, S1, S2 normal, no murmur, click, rub or gallop GI: soft, non-tender; bowel sounds normal; no masses,  no organomegaly  Lab Results:  Recent Labs  12/23/12 1748 12/23/12 2320 12/24/12 0455  WBC 8.9  --  6.8  HGB 11.5* 12.3 11.8*  HCT 34.5* 36.3 35.1*  PLT 297  --  233   BMET:  Recent Labs  12/23/12 0950 12/24/12 0455  NA 137 141  K 3.6 4.2  CL 92* 97  CO2 27 32  GLUCOSE 130* 92  BUN 27* 11  CREATININE 7.68* 4.48*  CALCIUM 10.7* 9.6   No results found for this basename: PTH,  in the last 72 hours Iron Studies: No results found for this basename: IRON, TIBC, TRANSFERRIN, FERRITIN,  in the last 72 hours Studies/Results: Dg Abd Acute W/chest  12/23/2012   *RADIOLOGY REPORT*  Clinical Data: Hematemesis.  Abdominal pain.  ACUTE ABDOMEN SERIES (ABDOMEN 2 VIEW & CHEST 1 VIEW)  Comparison: Chest radiograph 12/20/2012 and abdominal radiograph 12/21/2012.  Findings: Frontal view of the chest shows midline trachea and normal heart size.  Lungs are clear.  No pleural fluid.  Two views of the abdomen show stool in the majority the colon.  No small bowel dilatation.  No unexpected radiopaque calculi.  IMPRESSION: Bowel gas pattern is indicative of constipation.   Original Report Authenticated By: Lorin Picket,  M.D.    Scheduled: . sodium chloride   Intravenous STAT  . amLODipine  10 mg Oral QHS  . cephALEXin  500 mg Oral Q12H  . cloNIDine  0.3 mg Oral BID  . [START ON 12/25/2012] doxercalciferol  1 mcg Intravenous Q M,W,F-HD  . insulin aspart  0-5 Units Subcutaneous QHS  . insulin aspart  0-9 Units Subcutaneous TID WC  . lisinopril  40 mg Oral QHS  . pantoprazole (PROTONIX) IV  40 mg Intravenous Q12H  . simvastatin  10 mg Oral q1800  . sodium chloride  3 mL Intravenous Q12H   Asses   Hematemesis after recent N & V    ESRD  Plan for HD in AM if still in hospital.   LOS: 1 day   Jden Want C 12/24/2012,11:03 AM

## 2012-12-24 NOTE — H&P (View-Only) (Signed)
[name]: 10:45 AM 12/24/2012   Referring Provider: Dr Philipp Deputy Primary Care Physician:  Benito Mccreedy, MD Primary Gastroenterologist: none   Reason for Consultation:  hematemesis  HPI: [name] is a 42 y.o. female.   ESRD since spring 2013. Hypertension. Right arm steal syndrome with worsening pain, surgery for this planned 7/17.    Admitted 7/5 - 7/8 for nausea and vomiting that started on 6/31 after she got the second, in series of 3, hepatitis B shots.  The emesis was bilious, not bloody.  She was diagnosed with dehydration and UTI (?) (grew out 45 k of multiple species) and question of gastroenteritis.  She had been using 4 Aleve, and 4 Motrin daily for 7 to 10 days because of steal syndrom related right arm pain.  Nausea resolved during hospital stay but she had hemetemesis at 0100 on 7/9.  There has been no significant decline in Hgb  Previous to above, pt did not have GI issues.  She is not passing blood per rectum.  No dysphagia.  Sugars well controlled without meds.  Never had an EGD or colonoscopy.  Generally eats well.  Last BM was on 7/4.  Normal pattern is q day to qod BMs but generally doesn't use laxative.   Per nutritionist note pt is malnourished.    Past Medical History  Diagnosis Date  . Retinopathy   . Metabolic bone disease   . Anemia   . CHF (congestive heart failure)   . Pneumonia   . Blood transfusion   . Chronic kidney disease     M-W-F  . Hypertension     sees Dr. Carolin Guernsey  . Diabetes mellitus     "controlled with diet"  . Hemodialysis patient     Past Surgical History  Procedure Laterality Date  . Insertion of dialysis catheter  10/04/2011    Procedure: INSERTION OF DIALYSIS CATHETER;  Surgeon: Angelia Mould, MD;  Location: [place];  Service: Vascular;  Laterality: Right;  insertion of dialysis catheter right internal jugular  . Av fistula placement  10/04/2011     Procedure: INSERTION OF ARTERIOVENOUS (AV) GORE-TEX GRAFT ARM;  Surgeon: Angelia Mould, MD;  Location: [place];  Service: Vascular;  Laterality: Left;  Insertion left upper arm Arteriovenous goretex graft  . Avgg removal  10/04/2011    Procedure: REMOVAL OF ARTERIOVENOUS GORETEX GRAFT ([place]);  Surgeon: Elam Dutch, MD;  Location: [place];  Service: Vascular;  Laterality: Left;  . Av fistula placement  10/29/2011    Procedure: ARTERIOVENOUS (AV) FISTULA CREATION;  Surgeon: Angelia Mould, MD;  Location[facility] OR;  Service: Vascular;  Laterality: Right;  Creation Right Arteriovenous Fistula   . Revison of arteriovenous fistula  06/23/2012    Procedure: REVISON OF ARTERIOVENOUS FISTULA;  Surgeon: Angelia Mould, MD;  Location: [place];  Service: Vascular;  Laterality: Right;  Ultrasound guided  . Insertion of dialysis catheter  06/23/2012    Procedure: INSERTION OF DIALYSIS CATHETER;  Surgeon: Angelia Mould, MD;  Location: [place];  Service: Vascular;  Laterality: N/A;  Ultrasound guided  . Patch angioplasty  06/23/2012    Procedure: PATCH ANGIOPLASTY;  Surgeon: Angelia Mould, MD;  Location: [place];  Service: Vascular;  Laterality: Right;    Prior to Admission medications   Medication Sig Start Date End Date Taking? Authorizing Provider  amLODipine (NORVASC) 10 MG tablet Take 10 mg by mouth at bedtime.  11/14/11  Yes Historical Provider, MD  B  Complex-C-Folic Acid (DIALYVITE Q000111Q PO) Take 1 tablet by mouth at bedtime.    Yes Historical Provider, MD  calcium acetate (PHOSLO) 667 MG capsule Take 1,334 mg by mouth 3 (three) times daily with meals.   Yes Historical Provider, MD  cephALEXin (KEFLEX) 500 MG capsule Take 1 capsule (500 mg total) by mouth 2 (two) times daily. 12/22/12 12/29/12 Yes Reyne Dumas, MD  cloNIDine (CATAPRES) 0.3 MG tablet Take 0.3-0.6 mg by mouth 2 (two) times daily. Take one tablet in the morning. Take two at bedtime.   Yes Historical Provider, MD  lisinopril  (PRINIVIL,ZESTRIL) 20 MG tablet Take 40 mg by mouth at bedtime.    Yes Historical Provider, MD  oxyCODONE-acetaminophen (PERCOCET/ROXICET) 5-325 MG per tablet Take 1 tablet by mouth every 4 (four) hours as needed for pain. 12/18/12  Yes Shari A Upstill, PA-C  pravastatin (PRAVACHOL) 20 MG tablet Take 20 mg by mouth at bedtime.  09/11/12  Yes Historical Provider, MD    Scheduled Meds: . sodium chloride   Intravenous STAT  . amLODipine  10 mg Oral QHS  . cephALEXin  500 mg Oral Q12H  . cloNIDine  0.3 mg Oral BID  . [START ON 12/25/2012] doxercalciferol  1 mcg Intravenous Q M,W,F-HD  . insulin aspart  0-5 Units Subcutaneous QHS  . insulin aspart  0-9 Units Subcutaneous TID WC  . lisinopril  40 mg Oral QHS  . pantoprazole (PROTONIX) IV  40 mg Intravenous Q12H  . simvastatin  10 mg Oral q1800  . sodium chloride  3 mL Intravenous Q12H   Infusions:   PRN Meds: acetaminophen, acetaminophen, oxyCODONE-acetaminophen   Allergies as of 12/23/2012  . (No Known Allergies)    Family History  Problem Relation Age of Onset  . Malignant hyperthermia Mother   . Malignant hyperthermia Father   . Anesthesia problems Neg Hx     Dad with hx PUD  History   Social History  . Marital Status: Single    Spouse Name: N/A    Number of Children: N/A  . Years of Education: N/A   Occupational History  . Disabled.    Social History Main Topics  . Smoking status: Never Smoker   . Smokeless tobacco: Never Used  . Alcohol Use: No  . Drug Use: No  . Sexually Active: No   Other Topics Concern  . Not on file   Social History Narrative  . No literacy problems    REVIEW OF SYSTEMS: Constitutional:  Feels weak, but this is not chronic ENT:  No nose bleeds Pulm:  No SOB or cough CV:  No palpitations or chest pain GU:  Still urinates, no blood in urine. No dysuria GI:  Per HPI.  No hx abnormal LFTs Heme:  No anemia.    Transfusions:  none Neuro:  Some headaches Derm:  Sore on right  finger Endocrine:  No excessive thirst.  No sweats or chills Immunization:  Up to date.  Hep B shot 2 of three 10 days ago Travel:  None.   PHYSICAL EXAM: Vital signs in last 24 hours: Temp:  [98.1 F (36.7 C)-99.1 F (37.3 C)] 98.5 F (36.9 C) (07/10 0915) Pulse Rate:  [88-132] 89 (07/10 0915) Resp:  [8-24] 17 (07/10 0915) BP: (104-233)/(55-106) 167/70 mmHg (07/10 0915) SpO2:  [91 %-100 %] 97 % (07/10 0915) Weight:  [52.8 kg (116 lb 6.5 oz)-54.6 kg (120 lb 5.9 oz)] 53.7 kg (118 lb 6.2 oz) (07/09 2209)  General: pleasant, alert, comfortable AAF Head:  No swelling or asymmetry  Eyes:  No icterus or pallor Ears:  Not HOH  Nose:  No discharge Mouth:  Clear, moist oral MM Neck:  No mass or JVD Lungs:  Clear.  No cough or dyspnea Heart: RRR.  No MRG Abdomen:  Soft, not tender.  Not distended.  BS hypoactive.   Rectal: not done   Musc/Skeltl: no joint deformity Extremities:  AVG in right upper arm.  No pedal edema  Neurologic:  No tremor.  No gross deficits.  Excellent historian.  Oriented x 3. Skin:  No rash or sores Tattoos:  On both upper arms (from [place]) Nodes:  No adenopthy in neck or groin   Psych:  Pleasant, not agitated or depressed.   Intake/Output from previous day: 07/09 0701 - 07/10 0700 In: 0  Out: 1794  Intake/Output this shift:    LAB RESULTS:  Recent Labs  12/23/12 0950 12/23/12 1748 12/23/12 2320 12/24/12 0455  WBC 10.9* 8.9  --  6.8  HGB 12.6 11.5* 12.3 11.8*  HCT 37.4 34.5* 36.3 35.1*  PLT 297 297  --  233   BMET Lab Results  Component Value Date   NA 141 12/24/2012   NA 137 12/23/2012   NA 135 12/22/2012   K 4.2 12/24/2012   K 3.6 12/23/2012   K 3.6 12/22/2012   CL 97 12/24/2012   CL 92* 12/23/2012   CL 95* 12/22/2012   CO2 32 12/24/2012   CO2 27 12/23/2012   CO2 30 12/22/2012   GLUCOSE 92 12/24/2012   GLUCOSE 130* 12/23/2012   GLUCOSE 142* 12/22/2012   BUN 11 12/24/2012   BUN 27* 12/23/2012   BUN 19 12/22/2012   CREATININE 4.48* 12/24/2012    CREATININE 7.68* 12/23/2012   CREATININE 5.66* 12/22/2012   CALCIUM 9.6 12/24/2012   CALCIUM 10.7* 12/23/2012   CALCIUM 9.3 12/22/2012   LFT  Recent Labs  12/22/12 0455 12/23/12 0950 12/24/12 0455  PROT 6.1 7.8  --   ALBUMIN 2.8* 3.6 3.1*  AST 14 25  --   ALT 11 17  --   ALKPHOS 45 58  --   BILITOT 0.2* 0.2*  --    PT/INR Lab Results  Component Value Date   INR 0.89 06/23/2012   Hepatitis Panel  Recent Labs  12/21/12 1341  HEPBSAG NEGATIVE   C-Diff No components found with this basename: cdiff    Drugs of Abuse  No results found for this basename: labopia, cocainscrnur, labbenz, amphetmu, thcu, labbarb     RADIOLOGY STUDIES: Dg Abd Acute W/chest  12/23/2012    IMPRESSION: Bowel gas pattern is indicative of constipation.   Original Report Authenticated By: Lorin Picket, M.D.    ENDOSCOPIC STUDIES: None ever  IMPRESSION: *  Hemetemesis.  Rule out MW tear, rule out ulcer in pt who was taking NSAIds until 6 days ago *   ESRD.  Dialyzes MWF *  Right UE steal syndrome, right arm pain.  For surgery to address this on 7/17 *  Hypertension.   *  Constipation.  *  UTI???.  The growth last week was not indicitive of a UTI, repeat U/A is full of squamous cells, WBCs normal.  she is still getting Keflex however.  *  Malnutrition per RD  PLAN: *  EGD.  Pt agreeable to proceed.  *  Miralax for constipation.  *  Stop Keflex. Get a clean catch urine on clean catch urine.     LOS: 1 day  Azucena Freed  12/24/2012, 10:45 AM Pager: [phone]     ________________________________________________________________________  Velora Heckler GI MD note:  I personally examined the patient, reviewed the data and agree with the assessment and plan described above. EGD today.   Owens Loffler, MD [facility] Gastroenterology Pager [phone]

## 2012-12-25 ENCOUNTER — Encounter (HOSPITAL_COMMUNITY): Payer: Self-pay | Admitting: Gastroenterology

## 2012-12-25 LAB — CBC
HCT: 35.2 % — ABNORMAL LOW (ref 36.0–46.0)
Hemoglobin: 11.6 g/dL — ABNORMAL LOW (ref 12.0–15.0)
MCH: 28.6 pg (ref 26.0–34.0)
MCHC: 33 g/dL (ref 30.0–36.0)
MCV: 86.7 fL (ref 78.0–100.0)
Platelets: 270 10*3/uL (ref 150–400)
RBC: 4.06 MIL/uL (ref 3.87–5.11)
RDW: 13.9 % (ref 11.5–15.5)
WBC: 7.4 10*3/uL (ref 4.0–10.5)

## 2012-12-25 LAB — BASIC METABOLIC PANEL
BUN: 18 mg/dL (ref 6–23)
CO2: 30 mEq/L (ref 19–32)
Calcium: 9.6 mg/dL (ref 8.4–10.5)
Chloride: 95 mEq/L — ABNORMAL LOW (ref 96–112)
Creatinine, Ser: 5.77 mg/dL — ABNORMAL HIGH (ref 0.50–1.10)
GFR calc Af Amer: 9 mL/min — ABNORMAL LOW (ref >90)
GFR calc non Af Amer: 8 mL/min — ABNORMAL LOW (ref >90)
Glucose, Bld: 124 mg/dL — ABNORMAL HIGH (ref 70–99)
Potassium: 3.5 mEq/L (ref 3.5–5.1)
Sodium: 134 mEq/L — ABNORMAL LOW (ref 135–145)

## 2012-12-25 LAB — URINE CULTURE
Colony Count: NO GROWTH
Culture: NO GROWTH

## 2012-12-25 LAB — GLUCOSE, CAPILLARY: Glucose-Capillary: 139 mg/dL — ABNORMAL HIGH (ref 70–99)

## 2012-12-25 MED ORDER — OMEPRAZOLE 20 MG PO CPDR: 20.0000 mg | DELAYED_RELEASE_CAPSULE | Freq: Two times a day (BID) | ORAL | Status: DC

## 2012-12-25 MED ORDER — PANTOPRAZOLE SODIUM 40 MG PO TBEC: 40.0000 mg | DELAYED_RELEASE_TABLET | Freq: Two times a day (BID) | ORAL | Status: DC

## 2012-12-25 NOTE — Discharge Summary (Addendum)
Physician Discharge Summary  [name] [id number] DOB: [birth year] DOA: 12/23/2012  PCP: Benito Mccreedy, MD  Admit date: 12/23/2012 Discharge date: 12/25/2012  Time spent: 25 minutes  Recommendations for Outpatient Follow-up:  1. Needs definitive surgical management of subclavian steal syndrome right upper extremity 7/17 2. To continue her regular opiates as prescribed by surgeon 3. Continue PPI at least for 6 weeks twice a day   Discharge Diagnoses:  Active Problems:   DIABETES MELLITUS II, UNCOMPLICATED   HYPERTENSION, BENIGN SYSTEMIC   End stage renal disease   Hematemesis   Protein-calorie malnutrition, severe   Gastric ulcer   Unspecified gastritis and gastroduodenitis without mention of hemorrhage  Brief narrative:  42 yr old AAF, known ESRD M/W/, Chronic steal RUE c R Av fistula re-admitted for UGIB/? Mallory-Weiss tear. Her hemoglobin however was surprisingly stable to hospital stay. She thinks that nausea vomiting started after her hepatitis shots and last hospital admission had a potential UTI and a question of gastroenteritis. Because of pain in her right arm from subclavian steal syndrome she was taking for Motrin daily for 7-10 das.\ 9 any dark tarry stool. She was evaluated by nephrology as well as by gastroenterology and ultimately endoscopy was performed Endoscopy ultimately was performed which showed moderate gastritis distally in small benign-appearing clean-based pyloric channel ulcer suspicion was there for H. pylori however she was not empirically treated in the followup for the same and this will need follow-up She is to followup per Dr. Ardis Hughs discretion in 4-5 weeks for her symptoms and potentially get a repeat endoscopy She has no pain medications [has just not picked him up from the pharmacy] that were prescribed by Dr. Lucky Cowboy and hence no further prescriptions will be given   Discharge Condition: Good  Diet recommendation: Renal heart healthy  Filed  Weights   12/24/12 2131 12/25/12 0641 12/25/12 1021  Weight: 55.1 kg (121 lb 7.6 oz) 54 kg (119 lb 0.8 oz) 52.2 kg (115 lb 1.3 oz)    Procedures:  Endoscopy 12/24/2012 showed small pyloric ulcer (  Consultations:  Gastroenterology  Nephrology  Discharge Exam: Filed Vitals:   12/25/12 1000 12/25/12 1017 12/25/12 1021 12/25/12 1114  BP: 100/68 102/70 168/76 152/79  Pulse: 116 125 95 99  Temp:   98.5 F (36.9 C) 97.1 F (36.2 C)  TempSrc:   Oral Oral  Resp: 15 12 13 12   Height:      Weight:   52.2 kg (115 lb 1.3 oz)   SpO2:   100% 100%   doing well. Hungry. Returned from dialysis  General: Aler pleasant oriented Cardiovascular: S1-S2 no murmur rub or gallop Respiratory: Clinically clear  Discharge Instructions  Discharge Orders   Future Appointments Provider Department Dept Phone   01/22/2013 3:45 PM Milus Banister, MD [facility] [phone]   Future Orders Complete By Expires     Diet - low sodium heart healthy  As directed     Increase activity slowly  As directed         Medication List    STOP taking these medications       cephALEXin 500 MG capsule  Commonly known as:  KEFLEX      TAKE these medications       amLODipine 10 MG tablet  Commonly known as:  NORVASC  Take 10 mg by mouth at bedtime.     calcium acetate 667 MG capsule  Commonly known as:  PHOSLO  Take 1,334 mg by mouth 3 (three) times  daily with meals.     cloNIDine 0.3 MG tablet  Commonly known as:  CATAPRES  Take 0.3-0.6 mg by mouth 2 (two) times daily. Take one tablet in the morning. Take two at bedtime.     DIALYVITE 800 PO  Take 1 tablet by mouth at bedtime.     lisinopril 20 MG tablet  Commonly known as:  PRINIVIL,ZESTRIL  Take 40 mg by mouth at bedtime.     omeprazole 20 MG capsule  Commonly known as:  PRILOSEC  Take 1 capsule (20 mg total) by mouth 2 (two) times daily.     oxyCODONE-acetaminophen 5-325 MG per tablet  Commonly known as:   PERCOCET/ROXICET  Take 1 tablet by mouth every 4 (four) hours as needed for pain.     pravastatin 20 MG tablet  Commonly known as:  PRAVACHOL  Take 20 mg by mouth at bedtime.       No Known Allergies    The results of significant diagnostics from this hospitalization (including imaging, microbiology, ancillary and laboratory) are listed below for reference.    Significant Diagnostic Studies: Dg Chest 2 View  12/20/2012   *RADIOLOGY REPORT*  Clinical Data: Shortness of breath.  Nausea and vomiting since Tuesday.  Hepatitis that seen on Monday.  CHEST - 2 VIEW  Comparison: 06/23/2012  Findings: Interval removal of central venous catheter. The heart size and pulmonary vascularity are normal. The lungs appear clear and expanded without focal air space disease or consolidation. No blunting of the costophrenic angles.  No pneumothorax.  Mediastinal contours appear intact.  IMPRESSION: No evidence of active pulmonary disease.   Original Report Authenticated By: Lucienne Capers, M.D.   Dg Abd 1 View  12/21/2012   *RADIOLOGY REPORT*  Clinical Data: Nausea since yesterday.  ABDOMEN - 1 VIEW  Comparison: None.  Findings: Supine view of the abdomen and pelvis.  Nonobstructive bowel gas pattern.  Moderate ascending colonic stool. Distal gas and stool.  No free intraperitoneal air.  No abnormal abdominal calcifications.   No appendicolith.  Vascular calcifications.  Right hemidiaphragm elevation.  IMPRESSION: No acute findings.   Original Report Authenticated By: Abigail Miyamoto, M.D.   Dg Abd Acute W/chest  12/23/2012   *RADIOLOGY REPORT*  Clinical Data: Hematemesis.  Abdominal pain.  ACUTE ABDOMEN SERIES (ABDOMEN 2 VIEW & CHEST 1 VIEW)  Comparison: Chest radiograph 12/20/2012 and abdominal radiograph 12/21/2012.  Findings: Frontal view of the chest shows midline trachea and normal heart size.  Lungs are clear.  No pleural fluid.  Two views of the abdomen show stool in the majority the colon.  No small bowel  dilatation.  No unexpected radiopaque calculi.  IMPRESSION: Bowel gas pattern is indicative of constipation.   Original Report Authenticated By: Lorin Picket, M.D.    Microbiology: Recent Results (from the past 240 hour(s))  URINE CULTURE     Status: None   Collection Time    12/20/12  2:09 AM      Result Value Range Status   Specimen Description URINE, CLEAN CATCH   Final   Special Requests CX ADDED AT [id number] ON [id number]   Final   Culture  Setup Time 12/20/2012 02:44   Final   Colony Count 45,000 COLONIES/ML   Final   Culture     Final   Value: Multiple bacterial morphotypes present, none predominant. Suggest appropriate recollection if clinically indicated.   Report Status 12/21/2012 FINAL   Final  MRSA PCR SCREENING     Status: None  Collection Time    12/20/12  6:27 AM      Result Value Range Status   MRSA by PCR NEGATIVE  NEGATIVE Final   Comment:            The GeneXpert MRSA Assay (FDA     approved for NASAL specimens     only), is one component of a     comprehensive MRSA colonization     surveillance program. It is not     intended to diagnose MRSA     infection nor to guide or     monitor treatment for     MRSA infections.     Labs: Basic Metabolic Panel:  Recent Labs Lab 12/21/12 1341 12/22/12 0455 12/23/12 0950 12/24/12 0455 12/25/12 0646  NA 133* 135 137 141 134*  K 4.1 3.6 3.6 4.2 3.5  CL 87* 95* 92* 97 95*  CO2 22 30 27  32 30  GLUCOSE 110* 142* 130* 92 124*  BUN 52* 19 27* 11 18  CREATININE 10.60* 5.66* 7.68* 4.48* 5.77*  CALCIUM 9.8 9.3 10.7* 9.6 9.6  PHOS 8.4*  --   --  3.7  --    Liver Function Tests:  Recent Labs Lab 12/19/12 2130 12/21/12 1341 12/22/12 0455 12/23/12 0950 12/24/12 0455  AST 17  --  14 25  --   ALT 16  --  11 17  --   ALKPHOS 70  --  45 58  --   BILITOT 0.1*  --  0.2* 0.2*  --   PROT 8.3  --  6.1 7.8  --   ALBUMIN 4.0 3.4* 2.8* 3.6 3.1*    Recent Labs Lab 12/21/12 0903 12/23/12 1130  LIPASE 31 32   No  results found for this basename: AMMONIA,  in the last 168 hours CBC:  Recent Labs Lab 12/19/12 2130  12/21/12 1341 12/23/12 0950 12/23/12 1748 12/23/12 2320 12/24/12 0455 12/25/12 0500  WBC 10.6*  < > 12.2* 10.9* 8.9  --  6.8 7.4  NEUTROABS 7.2  --   --  7.5  --   --   --   --   HGB 13.3  < > 11.9* 12.6 11.5* 12.3 11.8* 11.6*  HCT 40.5  < > 35.0* 37.4 34.5* 36.3 35.1* 35.2*  MCV 86.9  < > 84.3 85.6 85.0  --  86.5 86.7  PLT 369  < > 311 297 297  --  233 270  < > = values in this interval not displayed. Cardiac Enzymes:  Recent Labs Lab 12/19/12 2320 12/21/12 0901  TROPONINI <0.30 <0.30   BNP: BNP (last 3 results) No results found for this basename: PROBNP,  in the last 8760 hours CBG:  Recent Labs Lab 12/23/12 2208 12/24/12 0328 12/24/12 0755 12/24/12 1538 12/25/12 1116  GLUCAP 73 93 83 144* 139*       Signed:  Gary Bultman, JAI-GURMUKH  [facility] 12/25/2012, 1:20 PM

## 2012-12-25 NOTE — Procedures (Signed)
Gastritis and pyloric channel ucer by endo.  Stable Hgb and BP. BFR 300 via AV access. C/O pain in right mid finger due to steal.  To follow up with Dr. Lucky Cowboy for surgical treatment next week. 7/17. [name] C

## 2012-12-25 NOTE — Progress Notes (Signed)
Patient was discharged home with family. Patient was given discharge instructions and information on prescriptions. [facility] called about protonix prescription stating medicare didn't cover this medication. Dr Beryle Lathe was called and changed prescription to Prilosec. Patient was told to contact doctor with questions and concerns. Patient was stable upon discharge.

## 2012-12-28 ENCOUNTER — Ambulatory Visit: Payer: Self-pay | Admitting: Vascular Surgery

## 2012-12-28 LAB — CBC
HCT: 34.7 % — ABNORMAL LOW (ref 35.0–47.0)
HGB: 11.3 g/dL — ABNORMAL LOW (ref 12.0–16.0)
MCH: 28.3 pg (ref 26.0–34.0)
MCHC: 32.5 g/dL (ref 32.0–36.0)
MCV: 87 fL (ref 80–100)
Platelet: 297 10*3/uL (ref 150–440)
RBC: 3.98 10*6/uL (ref 3.80–5.20)
RDW: 13.9 % (ref 11.5–14.5)
WBC: 8.5 10*3/uL (ref 3.6–11.0)

## 2012-12-28 LAB — BASIC METABOLIC PANEL
Anion Gap: 6 — ABNORMAL LOW (ref 7–16)
BUN: 31 mg/dL — ABNORMAL HIGH (ref 7–18)
Calcium, Total: 10.5 mg/dL — ABNORMAL HIGH (ref 8.5–10.1)
Chloride: 96 mmol/L — ABNORMAL LOW (ref 98–107)
Co2: 33 mmol/L — ABNORMAL HIGH (ref 21–32)
Creatinine: 8.9 mg/dL — ABNORMAL HIGH (ref 0.60–1.30)
EGFR (African American): 6 — ABNORMAL LOW
EGFR (Non-African Amer.): 5 — ABNORMAL LOW
Glucose: 139 mg/dL — ABNORMAL HIGH (ref 65–99)
Osmolality: 279 (ref 275–301)
Potassium: 4.1 mmol/L (ref 3.5–5.1)
Sodium: 135 mmol/L — ABNORMAL LOW (ref 136–145)

## 2012-12-28 LAB — PREGNANCY, URINE: Pregnancy Test, Urine: NEGATIVE m[IU]/mL

## 2012-12-31 ENCOUNTER — Ambulatory Visit: Payer: Self-pay | Admitting: Vascular Surgery

## 2013-01-22 ENCOUNTER — Encounter: Payer: Self-pay | Admitting: Gastroenterology

## 2013-01-22 ENCOUNTER — Ambulatory Visit (INDEPENDENT_AMBULATORY_CARE_PROVIDER_SITE_OTHER): Payer: Medicare Other | Admitting: Gastroenterology

## 2013-01-22 VITALS — BP 134/70 | HR 90 | Ht 62.0 in | Wt 119.4 lb

## 2013-01-22 DIAGNOSIS — K259 Gastric ulcer, unspecified as acute or chronic, without hemorrhage or perforation: Secondary | ICD-10-CM

## 2013-01-22 NOTE — Progress Notes (Signed)
Review of pertinent gastrointestinal problems: 1. Mild hematemesis, vomiting; EGD 09/2012 Dr. Ardis Hughs while pt hospitalized found clean based pyloric channel ulcer, gastritis (h. Pylori neg on biopsies). Recommended bid PP and follow up EGD.  She was "popping NSAIDs like candy."  Alleve, motrin and others.  HPI: This is a   very pleasant 42 year old woman whom I last saw about a month ago while she was hospitalized at [facility]. See those results summarized above  Since she was discharged she is continuing to take NSAID type medicines, This is now in the form of Excedrin 2-4 pills daily. She has been taking Prilosec twice daily until a week ago when she switched back to once daily.  She has minor dyspepsia but no overt bleeding.  She takes take excedrine now, 2 twice daily.  She takes it for hand pains, other pains.    Past Medical History  Diagnosis Date  . Retinopathy   . Metabolic bone disease   . Anemia   . CHF (congestive heart failure)   . Pneumonia   . Blood transfusion     pt denies  . Chronic kidney disease     M-W-F  . Hypertension     sees Dr. Carolin Guernsey  . Diabetes mellitus     "controlled with diet"  . Hemodialysis patient     Past Surgical History  Procedure Laterality Date  . Insertion of dialysis catheter  10/04/2011    Procedure: INSERTION OF DIALYSIS CATHETER;  Surgeon: Angelia Mould, MD;  Location: [place];  Service: Vascular;  Laterality: Right;  insertion of dialysis catheter right internal jugular  . Av fistula placement  10/04/2011    Procedure: INSERTION OF ARTERIOVENOUS (AV) GORE-TEX GRAFT ARM;  Surgeon: Angelia Mould, MD;  Location: [place];  Service: Vascular;  Laterality: Left;  Insertion left upper arm Arteriovenous goretex graft  . Avgg removal  10/04/2011    Procedure: REMOVAL OF ARTERIOVENOUS GORETEX GRAFT ([place]);  Surgeon: Elam Dutch, MD;  Location: [place];  Service: Vascular;  Laterality: Left;  . Av fistula placement   10/29/2011    Procedure: ARTERIOVENOUS (AV) FISTULA CREATION;  Surgeon: Angelia Mould, MD;  Location: [facility] OR;  Service: Vascular;  Laterality: Right;  Creation Right Arteriovenous Fistula   . Revison of arteriovenous fistula  06/23/2012    Procedure: REVISON OF ARTERIOVENOUS FISTULA;  Surgeon: Angelia Mould, MD;  Location: [place];  Service: Vascular;  Laterality: Right;  Ultrasound guided  . Insertion of dialysis catheter  06/23/2012    Procedure: INSERTION OF DIALYSIS CATHETER;  Surgeon: Angelia Mould, MD;  Location: [place];  Service: Vascular;  Laterality: N/A;  Ultrasound guided  . Patch angioplasty  06/23/2012    Procedure: PATCH ANGIOPLASTY;  Surgeon: Angelia Mould, MD;  Location: [facility] OR;  Service: Vascular;  Laterality: Right;  . Esophagogastroduodenoscopy N/A 12/24/2012    Procedure: ESOPHAGOGASTRODUODENOSCOPY (EGD);  Surgeon: Milus Banister, MD;  Location: [place];  Service: Endoscopy;  Laterality: N/A;    Current Outpatient Prescriptions  Medication Sig Dispense Refill  . amLODipine (NORVASC) 10 MG tablet Take 10 mg by mouth at bedtime.       . B Complex-C-Folic Acid (DIALYVITE Q000111Q PO) Take 1 tablet by mouth at bedtime.       . calcium acetate (PHOSLO) 667 MG capsule Take 1,334 mg by mouth 3 (three) times daily with meals.      . cloNIDine (CATAPRES) 0.3 MG tablet Take 0.3-0.6 mg by  mouth 2 (two) times daily. Take one tablet in the morning. Take two at bedtime.      Marland Kitchen lisinopril (PRINIVIL,ZESTRIL) 20 MG tablet Take 40 mg by mouth at bedtime.       Marland Kitchen omeprazole (PRILOSEC) 20 MG capsule Take 1 capsule (20 mg total) by mouth 2 (two) times daily.  60 capsule  1  . oxyCODONE-acetaminophen (PERCOCET/ROXICET) 5-325 MG per tablet Take 1 tablet by mouth every 4 (four) hours as needed for pain.  15 tablet  0  . pravastatin (PRAVACHOL) 20 MG tablet Take 20 mg by mouth at bedtime.        No current facility-administered medications for this visit.    Allergies as of  01/22/2013  . (No Known Allergies)    Family History  Problem Relation Age of Onset  . Malignant hyperthermia Mother   . Malignant hyperthermia Father   . Anesthesia problems Neg Hx   . Thyroid disease Mother   . Diabetes Brother   . Heart disease Brother     CHF  . Hypertension Mother   . Hypertension Father     History   Social History  . Marital Status: Single    Spouse Name: N/A    Number of Children: 0  . Years of Education: N/A   Occupational History  . Not on file.   Social History Main Topics  . Smoking status: Never Smoker   . Smokeless tobacco: Never Used  . Alcohol Use: No  . Drug Use: No  . Sexually Active: No   Other Topics Concern  . Not on file   Social History Narrative  . No narrative on file      Physical Exam: BP 134/70  Pulse 90  Ht 5\' 2"  (1.575 m)  Wt 119 lb 6 oz (54.148 kg)  BMI 21.83 kg/m2  LMP 01/11/2013 Constitutional: generally well-appearing Psychiatric: alert and oriented x3 Abdomen: soft, nontender, nondistended, no obvious ascites, no peritoneal signs, normal bowel sounds     Assessment and plan: 42 y.o. female with  history of gastric ulcer  Her ulcer and also likely her dyspepsia are related to NSAIDs, aspirin products. I recommended she try to cut back or stop altogether. She will continue her proton pump inhibitor once daily indefinitely while she continues to take these products. We will proceed with EGD at her soonest convenience to check for ulcer healing.

## 2013-01-22 NOTE — Patient Instructions (Addendum)
You should try to cut back or stop taking excedrine (or NSAIDs), these probably are what caused your stomach ulcer. You will be set up for an upper endoscopy at [facility] with [facility] in about 4 weeks. Continue on prilosec one pill daily.                                               We are excited to introduce MyChart, a new best-in-class service that provides you online access to important information in your electronic medical record. We want to make it easier for you to view your health information - all in one secure location - when and where you need it. We expect MyChart will enhance the quality of care and service we provide.  When you register for MyChart, you can:    View your test results.    Request appointments and receive appointment reminders via email.    Request medication renewals.    View your medical history, allergies, medications and immunizations.    Communicate with your physician's office through a password-protected site.    Conveniently print information such as your medication lists.  To find out if MyChart is right for you, please talk to a member of our clinical staff today. We will gladly answer your questions about this free health and wellness tool.  If you are age 43 or older and want a member of your family to have access to your record, you must provide written consent by completing a proxy form available at our office. Please speak to our clinical staff about guidelines regarding accounts for patients younger than age 10.  As you activate your MyChart account and need any technical assistance, please call the MyChart technical support line at [phone]) or email your question to [email or web address]. If you email your question(s), please include your name, a return phone number and the best time to reach you.  If you have non-urgent health-related questions, you can send a message to our office through [place] at [email or web address].  If you have a medical emergency, call 911.  Thank you for using MyChart as your new health and wellness resource!   MyChart licensed from [employer],  1999-2010. Patents Pending.

## 2013-01-24 HISTORY — PX: KIDNEY TRANSPLANT: SHX239

## 2013-02-19 ENCOUNTER — Encounter: Payer: Medicare Other | Admitting: Gastroenterology

## 2013-02-24 ENCOUNTER — Ambulatory Visit: Payer: Self-pay | Admitting: Specialist

## 2013-02-26 HISTORY — PX: FINGER AMPUTATION: SHX636

## 2013-03-02 ENCOUNTER — Ambulatory Visit: Payer: Self-pay | Admitting: Specialist

## 2013-03-04 LAB — PATHOLOGY REPORT

## 2013-04-19 ENCOUNTER — Ambulatory Visit: Payer: Self-pay | Admitting: Vascular Surgery

## 2013-04-19 LAB — BASIC METABOLIC PANEL
Anion Gap: 6 — ABNORMAL LOW (ref 7–16)
BUN: 33 mg/dL — ABNORMAL HIGH (ref 7–18)
Calcium, Total: 9 mg/dL (ref 8.5–10.1)
Chloride: 109 mmol/L — ABNORMAL HIGH (ref 98–107)
Co2: 20 mmol/L — ABNORMAL LOW (ref 21–32)
Creatinine: 0.45 mg/dL — ABNORMAL LOW (ref 0.60–1.30)
EGFR (African American): >60
EGFR (Non-African Amer.): >60
Glucose: 139 mg/dL — ABNORMAL HIGH (ref 65–99)
Osmolality: 280 (ref 275–301)
Potassium: 5.3 mmol/L — ABNORMAL HIGH (ref 3.5–5.1)
Sodium: 135 mmol/L — ABNORMAL LOW (ref 136–145)

## 2013-04-19 LAB — HCG, QUANTITATIVE, PREGNANCY: Beta Hcg, Quant.: <1 m[IU]/mL — ABNORMAL LOW

## 2013-05-25 ENCOUNTER — Ambulatory Visit: Payer: Self-pay

## 2013-06-02 ENCOUNTER — Ambulatory Visit: Payer: Self-pay

## 2013-06-08 ENCOUNTER — Ambulatory Visit
Admission: RE | Admit: 2013-06-08 | Discharge: 2013-06-08 | Disposition: A | Discharge status: Home / Self Care | Payer: Medicare Other | Source: Ambulatory Visit | Attending: Internal Medicine | Admitting: Internal Medicine

## 2013-06-08 DIAGNOSIS — Z1231 Encounter for screening mammogram for malignant neoplasm of breast: Secondary | ICD-10-CM

## 2013-07-14 ENCOUNTER — Ambulatory Visit: Payer: Self-pay | Admitting: Vascular Surgery

## 2013-07-14 LAB — BASIC METABOLIC PANEL
Anion Gap: 8 (ref 7–16)
BUN: 25 mg/dL — ABNORMAL HIGH (ref 7–18)
Calcium, Total: 9.2 mg/dL (ref 8.5–10.1)
Chloride: 103 mmol/L (ref 98–107)
Co2: 23 mmol/L (ref 21–32)
Creatinine: 1.17 mg/dL (ref 0.60–1.30)
EGFR (African American): >60
EGFR (Non-African Amer.): 57 — ABNORMAL LOW
Glucose: 155 mg/dL — ABNORMAL HIGH (ref 65–99)
Osmolality: 276 (ref 275–301)
Potassium: 3.9 mmol/L (ref 3.5–5.1)
Sodium: 134 mmol/L — ABNORMAL LOW (ref 136–145)

## 2013-07-14 LAB — HCG, QUANTITATIVE, PREGNANCY: Beta Hcg, Quant.: <1 m[IU]/mL — ABNORMAL LOW

## 2013-11-21 ENCOUNTER — Emergency Department (HOSPITAL_COMMUNITY)
Admission: EM | Admit: 2013-11-21 | Discharge: 2013-11-21 | Disposition: A | Discharge status: Home / Self Care | Payer: Medicare Other | Attending: Emergency Medicine | Admitting: Emergency Medicine

## 2013-11-21 ENCOUNTER — Encounter (HOSPITAL_COMMUNITY): Payer: Self-pay | Admitting: Emergency Medicine

## 2013-11-21 ENCOUNTER — Emergency Department (HOSPITAL_COMMUNITY): Payer: Medicare Other

## 2013-11-21 DIAGNOSIS — M549 Dorsalgia, unspecified: Secondary | ICD-10-CM | POA: Insufficient documentation

## 2013-11-21 DIAGNOSIS — R52 Pain, unspecified: Secondary | ICD-10-CM | POA: Insufficient documentation

## 2013-11-21 DIAGNOSIS — Z794 Long term (current) use of insulin: Secondary | ICD-10-CM | POA: Insufficient documentation

## 2013-11-21 DIAGNOSIS — Z992 Dependence on renal dialysis: Secondary | ICD-10-CM | POA: Insufficient documentation

## 2013-11-21 DIAGNOSIS — A599 Trichomoniasis, unspecified: Secondary | ICD-10-CM | POA: Insufficient documentation

## 2013-11-21 DIAGNOSIS — E119 Type 2 diabetes mellitus without complications: Secondary | ICD-10-CM | POA: Insufficient documentation

## 2013-11-21 DIAGNOSIS — I509 Heart failure, unspecified: Secondary | ICD-10-CM | POA: Insufficient documentation

## 2013-11-21 DIAGNOSIS — Z940 Kidney transplant status: Secondary | ICD-10-CM | POA: Insufficient documentation

## 2013-11-21 DIAGNOSIS — Z8739 Personal history of other diseases of the musculoskeletal system and connective tissue: Secondary | ICD-10-CM | POA: Insufficient documentation

## 2013-11-21 DIAGNOSIS — Z8701 Personal history of pneumonia (recurrent): Secondary | ICD-10-CM | POA: Insufficient documentation

## 2013-11-21 DIAGNOSIS — Z7982 Long term (current) use of aspirin: Secondary | ICD-10-CM | POA: Insufficient documentation

## 2013-11-21 DIAGNOSIS — Z79899 Other long term (current) drug therapy: Secondary | ICD-10-CM | POA: Insufficient documentation

## 2013-11-21 DIAGNOSIS — Z862 Personal history of diseases of the blood and blood-forming organs and certain disorders involving the immune mechanism: Secondary | ICD-10-CM | POA: Insufficient documentation

## 2013-11-21 DIAGNOSIS — Z8669 Personal history of other diseases of the nervous system and sense organs: Secondary | ICD-10-CM | POA: Insufficient documentation

## 2013-11-21 DIAGNOSIS — N186 End stage renal disease: Secondary | ICD-10-CM | POA: Insufficient documentation

## 2013-11-21 DIAGNOSIS — I120 Hypertensive chronic kidney disease with stage 5 chronic kidney disease or end stage renal disease: Secondary | ICD-10-CM | POA: Insufficient documentation

## 2013-11-21 LAB — URINALYSIS, ROUTINE W REFLEX MICROSCOPIC
Glucose, UA: NEGATIVE mg/dL
Hgb urine dipstick: NEGATIVE
Ketones, ur: 15 mg/dL — AB
Nitrite: NEGATIVE
Protein, ur: 30 mg/dL — AB
Specific Gravity, Urine: 1.027 (ref 1.005–1.030)
Urobilinogen, UA: 1 mg/dL (ref 0.0–1.0)
pH: 5.5 (ref 5.0–8.0)

## 2013-11-21 LAB — COMPREHENSIVE METABOLIC PANEL
ALT: 14 U/L (ref 0–35)
AST: 20 U/L (ref 0–37)
Albumin: 3.3 g/dL — ABNORMAL LOW (ref 3.5–5.2)
Alkaline Phosphatase: 66 U/L (ref 39–117)
BUN: 23 mg/dL (ref 6–23)
CO2: 25 mEq/L (ref 19–32)
Calcium: 10.4 mg/dL (ref 8.4–10.5)
Chloride: 100 mEq/L (ref 96–112)
Creatinine, Ser: 0.98 mg/dL (ref 0.50–1.10)
GFR calc Af Amer: 80 mL/min — ABNORMAL LOW (ref >90)
GFR calc non Af Amer: 69 mL/min — ABNORMAL LOW (ref >90)
Glucose, Bld: 48 mg/dL — ABNORMAL LOW (ref 70–99)
Potassium: 4.3 mEq/L (ref 3.7–5.3)
Sodium: 139 mEq/L (ref 137–147)
Total Bilirubin: <0.2 mg/dL — ABNORMAL LOW (ref 0.3–1.2)
Total Protein: 7.2 g/dL (ref 6.0–8.3)

## 2013-11-21 LAB — URINE MICROSCOPIC-ADD ON

## 2013-11-21 LAB — CBC WITH DIFFERENTIAL/PLATELET
Basophils Absolute: 0 10*3/uL (ref 0.0–0.1)
Basophils Relative: 1 % (ref 0–1)
Eosinophils Absolute: 0 10*3/uL (ref 0.0–0.7)
Eosinophils Relative: 0 % (ref 0–5)
HCT: 35.4 % — ABNORMAL LOW (ref 36.0–46.0)
Hemoglobin: 11.4 g/dL — ABNORMAL LOW (ref 12.0–15.0)
Lymphocytes Relative: 18 % (ref 12–46)
Lymphs Abs: 0.5 10*3/uL — ABNORMAL LOW (ref 0.7–4.0)
MCH: 28.1 pg (ref 26.0–34.0)
MCHC: 32.2 g/dL (ref 30.0–36.0)
MCV: 87.4 fL (ref 78.0–100.0)
Monocytes Absolute: 1.3 10*3/uL — ABNORMAL HIGH (ref 0.1–1.0)
Monocytes Relative: 45 % — ABNORMAL HIGH (ref 3–12)
Neutro Abs: 1 10*3/uL — ABNORMAL LOW (ref 1.7–7.7)
Neutrophils Relative %: 35 % — ABNORMAL LOW (ref 43–77)
Platelets: 190 10*3/uL (ref 150–400)
RBC: 4.05 MIL/uL (ref 3.87–5.11)
RDW: 13.1 % (ref 11.5–15.5)
WBC: 2.8 10*3/uL — ABNORMAL LOW (ref 4.0–10.5)

## 2013-11-21 LAB — CBG MONITORING, ED
Glucose-Capillary: 53 mg/dL — ABNORMAL LOW (ref 70–99)
Glucose-Capillary: 166 mg/dL — ABNORMAL HIGH (ref 70–99)

## 2013-11-21 MED ORDER — METRONIDAZOLE 500 MG PO TABS
2000.0000 mg | ORAL_TABLET | Freq: Once | ORAL | Status: AC
  Administered 2013-11-21: 2000 mg via ORAL
  Filled 2013-11-21: qty 4

## 2013-11-21 MED ORDER — OXYCODONE-ACETAMINOPHEN 5-325 MG PO TABS
2.0000 | ORAL_TABLET | Freq: Once | ORAL | Status: AC
  Administered 2013-11-21: 2 via ORAL
  Filled 2013-11-21: qty 2

## 2013-11-21 MED ORDER — OXYCODONE-ACETAMINOPHEN 5-325 MG PO TABS
2.0000 | ORAL_TABLET | ORAL | Status: DC | PRN
Start: 2013-11-21 — End: 2014-08-22

## 2013-11-21 NOTE — Discharge Instructions (Signed)
Back Pain, Adult Low back pain is very common. About 1 in 5 people have back pain.The cause of low back pain is rarely dangerous. The pain often gets better over time.About half of people with a sudden onset of back pain feel better in just 2 weeks. About 8 in 10 people feel better by 6 weeks.  CAUSES Some common causes of back pain include:  Strain of the muscles or ligaments supporting the spine.  Wear and tear (degeneration) of the spinal discs.  Arthritis.  Direct injury to the back. DIAGNOSIS Most of the time, the direct cause of low back pain is not known.However, back pain can be treated effectively even when the exact cause of the pain is unknown.Answering your caregiver's questions about your overall health and symptoms is one of the most accurate ways to make sure the cause of your pain is not dangerous. If your caregiver needs more information, he or she may order lab work or imaging tests (X-rays or MRIs).However, even if imaging tests show changes in your back, this usually does not require surgery. HOME CARE INSTRUCTIONS For many people, back pain returns.Since low back pain is rarely dangerous, it is often a condition that people can learn to [facility] their own.   Remain active. It is stressful on the back to sit or stand in one place. Do not sit, drive, or stand in one place for more than 30 minutes at a time. Take short walks on level surfaces as soon as pain allows.Try to increase the length of time you walk each day.  Do not stay in bed.Resting more than 1 or 2 days can delay your recovery.  Do not avoid exercise or work.Your body is made to move.It is not dangerous to be active, even though your back may hurt.Your back will likely heal faster if you return to being active before your pain is gone.  Pay attention to your body when you bend and lift. Many people have less discomfortwhen lifting if they bend their knees, keep the load close to their bodies,and  avoid twisting. Often, the most comfortable positions are those that put less stress on your recovering back.  Find a comfortable position to sleep. Use a firm mattress and lie on your side with your knees slightly bent. If you lie on your back, put a pillow under your knees.  Only take over-the-counter or prescription medicines as directed by your caregiver. Over-the-counter medicines to reduce pain and inflammation are often the most helpful.Your caregiver may prescribe muscle relaxant drugs.These medicines help dull your pain so you can more quickly return to your normal activities and healthy exercise.  Put ice on the injured area.  Put ice in a plastic bag.  Place a towel between your skin and the bag.  Leave the ice on for 15-20 minutes, 03-04 times a day for the first 2 to 3 days. After that, ice and heat may be alternated to reduce pain and spasms.  Ask your caregiver about trying back exercises and gentle massage. This may be of some benefit.  Avoid feeling anxious or stressed.Stress increases muscle tension and can worsen back pain.It is important to recognize when you are anxious or stressed and learn ways to manage it.Exercise is a great option. SEEK MEDICAL CARE IF:  You have pain that is not relieved with rest or medicine.  You have pain that does not improve in 1 week.  You have new symptoms.  You are generally not feeling well. SEEK  IMMEDIATE MEDICAL CARE IF:   You have pain that radiates from your back into your legs.  You develop new bowel or bladder control problems.  You have unusual weakness or numbness in your arms or legs.  You develop nausea or vomiting.  You develop abdominal pain.  You feel faint. Document Released: 06/03/2005 Document Revised: 12/03/2011 Document Reviewed: 10/22/2010 [facility] Patient Information 2014 [place], [place]. Trichomoniasis Trichomoniasis is an infection, caused by the Trichomonas organism, that affects both women and  men. In women, the outer female genitalia and the vagina are affected. In men, the penis is mainly affected, but the prostate and other reproductive organs can also be involved. Trichomoniasis is a sexually transmitted disease (STD) and is most often passed to another person through sexual contact. The majority of people who get trichomoniasis do so from a sexual encounter and are also at risk for other STDs. CAUSES   Sexual intercourse with an infected partner.  It can be present in swimming pools or hot tubs. SYMPTOMS   Abnormal gray-green frothy vaginal discharge in women.  Vaginal itching and irritation in women.  Itching and irritation of the area outside the vagina in women.  Penile discharge with or without pain in males.  Inflammation of the urethra (urethritis), causing painful urination.  Bleeding after sexual intercourse. RELATED COMPLICATIONS  Pelvic inflammatory disease.  Infection of the uterus (endometritis).  Infertility.  Tubal (ectopic) pregnancy.  It can be associated with other STDs, including gonorrhea and chlamydia, hepatitis B, and HIV. COMPLICATIONS DURING PREGNANCY  Early (premature) delivery.  Premature rupture of the membranes (PROM).  Low birth weight. DIAGNOSIS   Visualization of Trichomonas under the microscope from the vagina discharge.  Ph of the vagina greater than 4.5, tested with a test tape.  Trich Rapid Test.  Culture of the organism, but this is not usually needed.  It may be found on a Pap test.  Having a "strawberry cervix,"which means the cervix looks very red like a strawberry. TREATMENT   You may be given medication to fight the infection. Inform your caregiver if you could be or are pregnant. Some medications used to treat the infection should not be taken during pregnancy.  Over-the-counter medications or creams to decrease itching or irritation may be recommended.  Your sexual partner will need to be treated if  infected. HOME CARE INSTRUCTIONS   Take all medication prescribed by your caregiver.  Take over-the-counter medication for itching or irritation as directed by your caregiver.  Do not have sexual intercourse while you have the infection.  Do not douche or wear tampons.  Discuss your infection with your partner, as your partner may have acquired the infection from you. Or, your partner may have been the person who transmitted the infection to you.  Have your sex partner examined and treated if necessary.  Practice safe, informed, and protected sex.  See your caregiver for other STD testing. SEEK MEDICAL CARE IF:   You still have symptoms after you finish the medication.  You have an oral temperature above 102 F (38.9 C).  You develop belly (abdominal) pain.  You have pain when you urinate.  You have bleeding after sexual intercourse.  You develop a rash.  The medication makes you sick or makes you throw up (vomit). Document Released: 11/27/2000 Document Revised: 08/26/2011 Document Reviewed: 12/23/2008 [facility] Patient Information 2014 [place], [place]. Hypoglycemia (Low Blood Sugar) Hypoglycemia is when the glucose (sugar) in your blood is too low. Hypoglycemia can happen for many reasons.  It can happen to people with or without diabetes. Hypoglycemia can develop quickly and can be a medical emergency.  CAUSES  Having hypoglycemia does not mean that you will develop diabetes. Different causes include:  Missed or delayed meals or not enough carbohydrates eaten.  Medication overdose. This could be by accident or deliberate. If by accident, your medication may need to be adjusted or changed.  Exercise or increased activity without adjustments in carbohydrates or medications.  A nerve disorder that affects body functions like your heart rate, blood pressure and digestion (autonomic neuropathy).  A condition where the stomach muscles do not function properly  (gastroparesis). Therefore, medications may not absorb properly.  The inability to recognize the signs of hypoglycemia (hypoglycemic unawareness).  Absorption of insulin  may be altered.  Alcohol consumption.  Pregnancy/menstrual cycles/postpartum. This may be due to hormones.  Certain kinds of tumors. This is very rare. SYMPTOMS   Sweating.  Hunger.  Dizziness.  Blurred vision.  Drowsiness.  Weakness.  Headache.  Rapid heart beat.  Shakiness.  Nervousness. DIAGNOSIS  Diagnosis is made by monitoring blood glucose in one or all of the following ways:  Fingerstick blood glucose monitoring.  Laboratory results. TREATMENT  If you think your blood glucose is low:  Check your blood glucose, if possible. If it is less than 70 mg/dl, take one of the following:  3-4 glucose tablets.   cup juice (prefer clear like apple).   cup "regular" soda pop.  1 cup milk.  -1 tube of glucose gel.  5-6 hard candies.  Do not over treat because your blood glucose (sugar) will only go too high.  Wait 15 minutes and recheck your blood glucose. If it is still less than 70 mg/dl (or below your target range), repeat treatment.  Eat a snack if it is more than one hour until your next meal. Sometimes, your blood glucose may go so low that you are unable to treat yourself. You may need someone to help you. You may even pass out or be unable to swallow. This may require you to get an injection of glucagon, which raises the blood glucose. HOME CARE INSTRUCTIONS  Check blood glucose as recommended by your caregiver.  Take medication as prescribed by your caregiver.  Follow your meal plan. Do not skip meals. Eat on time.  If you are going to drink alcohol, drink it only with meals.  Check your blood glucose before driving.  Check your blood glucose before and after exercise. If you exercise longer or different than usual, be sure to check blood glucose more  frequently.  Always carry treatment with you. Glucose tablets are the easiest to carry.  Always wear medical alert jewelry or carry some form of identification that states that you have diabetes. This will alert people that you have diabetes. If you have hypoglycemia, they will have a better idea on what to do. SEEK MEDICAL CARE IF:   You are having problems keeping your blood sugar at target range.  You are having frequent episodes of hypoglycemia.  You feel you might be having side effects from your medicines.  You have symptoms of an illness that is not improving after 3-4 days.  You notice a change in vision or a new problem with your vision. SEEK IMMEDIATE MEDICAL CARE IF:   You are a family member or friend of a person whose blood glucose goes below 70 mg/dl and is accompanied by:  Confusion.  A change in mental status.  The inability to swallow.  Passing out. Document Released: 06/03/2005 Document Revised: 08/26/2011 Document Reviewed: 09/30/2011 [facility] Patient Information 2014 [place], [place].

## 2013-11-21 NOTE — ED Provider Notes (Addendum)
CSN: [id number]     Arrival date & time 11/21/13  0848 History   First MD Initiated Contact with Patient 11/21/13 0913     Chief Complaint  Patient presents with  . Back Pain     (Consider location/radiation/quality/duration/timing/severity/associated sxs/prior Treatment) Patient is a 43 y.o. female presenting with back pain.  Back Pain  This is a 43 year old female with insulin-dependent diabetes, hypertension, status post kidney transplant who presents today complaining of mid to low back pain that has been present off and on for 24 hours. She states that it feels like a muscle spasm. It is worse with movement. It woke her up from sleep. She has not had any change in her urinary habits. She denies fever, chills, loss of bowel or bladder control, or weakness in her extremities. She states that she was seen [facility] on Thursday and was called and told she needed a dose of Neupogen on Friday because her white blood cell count was low. Past Medical History  Diagnosis Date  . Retinopathy   . Metabolic bone disease   . Anemia   . CHF (congestive heart failure)   . Pneumonia   . Blood transfusion     pt denies  . Chronic kidney disease     M-W-F  . Hypertension     sees Dr. Carolin Guernsey  . Diabetes mellitus     "controlled with diet"  . Hemodialysis patient    Past Surgical History  Procedure Laterality Date  . Insertion of dialysis catheter  10/04/2011    Procedure: INSERTION OF DIALYSIS CATHETER;  Surgeon: Angelia Mould, MD;  Location: [place];  Service: Vascular;  Laterality: Right;  insertion of dialysis catheter right internal jugular  . Av fistula placement  10/04/2011    Procedure: INSERTION OF ARTERIOVENOUS (AV) GORE-TEX GRAFT ARM;  Surgeon: Angelia Mould, MD;  Location: [place];  Service: Vascular;  Laterality: Left;  Insertion left upper arm Arteriovenous goretex graft  . Avgg removal  10/04/2011    Procedure: REMOVAL OF ARTERIOVENOUS GORETEX GRAFT  ([place]);  Surgeon: Elam Dutch, MD;  Location: [place];  Service: Vascular;  Laterality: Left;  . Av fistula placement  10/29/2011    Procedure: ARTERIOVENOUS (AV) FISTULA CREATION;  Surgeon: Angelia Mould, MD;  Location: [facility] OR;  Service: Vascular;  Laterality: Right;  Creation Right Arteriovenous Fistula   . Revison of arteriovenous fistula  06/23/2012    Procedure: REVISON OF ARTERIOVENOUS FISTULA;  Surgeon: Angelia Mould, MD;  Location: [place];  Service: Vascular;  Laterality: Right;  Ultrasound guided  . Insertion of dialysis catheter  06/23/2012    Procedure: INSERTION OF DIALYSIS CATHETER;  Surgeon: Angelia Mould, MD;  Location: [place];  Service: Vascular;  Laterality: N/A;  Ultrasound guided  . Patch angioplasty  06/23/2012    Procedure: PATCH ANGIOPLASTY;  Surgeon: Angelia Mould, MD;  Location: [facility] OR;  Service: Vascular;  Laterality: Right;  . Esophagogastroduodenoscopy N/A 12/24/2012    Procedure: ESOPHAGOGASTRODUODENOSCOPY (EGD);  Surgeon: Milus Banister, MD;  Location: [place];  Service: Endoscopy;  Laterality: N/A;  . Kidney transplant    . Finger amputation     Family History  Problem Relation Age of Onset  . Malignant hyperthermia Mother   . Malignant hyperthermia Father   . Anesthesia problems Neg Hx   . Thyroid disease Mother   . Diabetes Brother   . Heart disease Brother     CHF  . Hypertension  Mother   . Hypertension Father    History  Substance Use Topics  . Smoking status: Never Smoker   . Smokeless tobacco: Never Used  . Alcohol Use: No   OB History   Grav Para Term Preterm Abortions TAB SAB Ect Mult Living                 Review of Systems  Musculoskeletal: Positive for back pain.  All other systems reviewed and are negative.     Allergies  Review of patient's allergies indicates no known allergies.  Home Medications   Prior to Admission medications   Medication Sig Start Date End Date Taking? Authorizing Provider   amLODipine (NORVASC) 10 MG tablet Take 10 mg by mouth at bedtime.  11/14/11  Yes Historical Provider, MD  aspirin 325 MG EC tablet Take 325 mg by mouth daily.   Yes Historical Provider, MD  cloNIDine (CATAPRES) 0.1 MG tablet Take 0.2 mg by mouth 2 (two) times daily.   Yes Historical Provider, MD  insulin aspart (NOVOLOG) 100 UNIT/ML injection Inject 0-18 Units into the skin 3 (three) times daily before meals. Per sliding scale: aprox- >50 or 70; 0 units, 50 or 70-150 7 units, 151-200; 9 units, 201-250; 12 units, 251-300; 15 units, 301->; 18 units   Yes Historical Provider, MD  insulin detemir (LEVEMIR) 100 UNIT/ML injection Inject 14-22 Units into the skin 2 (two) times daily. 14 units in the morning and 22 units at bedtime   Yes Historical Provider, MD  labetalol (NORMODYNE) 300 MG tablet Take 300 mg by mouth 3 (three) times daily.   Yes Historical Provider, MD  magnesium oxide (MAG-OX) 400 MG tablet Take 400 mg by mouth daily.   Yes Historical Provider, MD  mycophenolate (MYFORTIC) 180 MG EC tablet Take 360 mg by mouth 2 (two) times daily.    Yes Historical Provider, MD  Pantoprazole Sodium (PROTONIX PO) Take 1 tablet by mouth daily.   Yes Historical Provider, MD  pravastatin (PRAVACHOL) 20 MG tablet Take 20 mg by mouth at bedtime.  09/11/12  Yes Historical Provider, MD  sodium bicarbonate 325 MG tablet Take 325 mg by mouth 2 (two) times daily.   Yes Historical Provider, MD  tacrolimus (PROGRAF) 0.5 MG capsule Take 0.5 mg by mouth every morning. Take with 1 mg in am to make 1.5 mg   Yes Historical Provider, MD  tacrolimus (PROGRAF) 1 MG capsule Take 1 mg by mouth 2 (two) times daily.   Yes Historical Provider, MD   BP 132/61  Pulse 82  Temp(Src) 97.8 F (36.6 C) (Oral)  Resp 16  Ht 5\' 2"  (1.575 m)  Wt 125 lb (56.7 kg)  BMI 22.86 kg/m2  SpO2 100%  LMP 11/10/2013 Physical Exam  Nursing note and vitals reviewed. Constitutional: She is oriented to person, place, and time. She appears  well-developed and well-nourished.  HENT:  Head: Normocephalic and atraumatic.  Right Ear: Tympanic membrane and external ear normal.  Left Ear: Tympanic membrane and external ear normal.  Nose: Nose normal. Right sinus exhibits no maxillary sinus tenderness and no frontal sinus tenderness. Left sinus exhibits no maxillary sinus tenderness and no frontal sinus tenderness.  Eyes: Conjunctivae and EOM are normal. Pupils are equal, round, and reactive to light. Right eye exhibits no nystagmus. Left eye exhibits no nystagmus.  Neck: Normal range of motion. Neck supple.  Cardiovascular: Normal rate, regular rhythm, normal heart sounds and intact distal pulses.   Pulmonary/Chest: Effort normal and breath sounds normal.  No respiratory distress. She exhibits no tenderness.  Abdominal: Soft. Bowel sounds are normal. She exhibits no distension and no mass. There is no tenderness.  Musculoskeletal: Normal range of motion. She exhibits no edema and no tenderness.  Third finger partial amputation left hand  Neurological: She is alert and oriented to person, place, and time. She has normal strength and normal reflexes. No sensory deficit. She displays a negative Romberg sign. GCS eye subscore is 4. GCS verbal subscore is 5. GCS motor subscore is 6.  Reflex Scores:      Tricep reflexes are 2+ on the right side and 2+ on the left side.      Bicep reflexes are 2+ on the right side and 2+ on the left side.      Brachioradialis reflexes are 2+ on the right side and 2+ on the left side.      Patellar reflexes are 2+ on the right side and 2+ on the left side.      Achilles reflexes are 2+ on the right side and 2+ on the left side. Patient with normal gait without ataxia, shuffling, spasm, or antalgia. Speech is normal without dysarthria, dysphasia, or aphasia. Muscle strength is 5/5 in bilateral shoulders, elbow flexor and extensors, wrist flexor and extensors, and intrinsic hand muscles. 5/5 bilateral lower  extremity hip flexors, extensors, knee flexors and extensors, and ankle dorsi and plantar flexors.    Skin: Skin is warm and dry. No rash noted.  Psychiatric: She has a normal mood and affect. Her behavior is normal. Judgment and thought content normal.    ED Course  Procedures (including critical care time) Labs Review Labs Reviewed  URINALYSIS, ROUTINE W REFLEX MICROSCOPIC - Abnormal; Notable for the following:    Color, Urine AMBER (*)    APPearance CLOUDY (*)    Bilirubin Urine SMALL (*)    Ketones, ur 15 (*)    Protein, ur 30 (*)    Leukocytes, UA MODERATE (*)    All other components within normal limits  CBC WITH DIFFERENTIAL - Abnormal; Notable for the following:    WBC 2.8 (*)    Hemoglobin 11.4 (*)    HCT 35.4 (*)    Neutrophils Relative % 35 (*)    Neutro Abs 1.0 (*)    Lymphs Abs 0.5 (*)    Monocytes Relative 45 (*)    Monocytes Absolute 1.3 (*)    All other components within normal limits  COMPREHENSIVE METABOLIC PANEL - Abnormal; Notable for the following:    Glucose, Bld 48 (*)    Albumin 3.3 (*)    Total Bilirubin <0.2 (*)    GFR calc non Af Amer 69 (*)    GFR calc Af Amer 80 (*)    All other components within normal limits  URINE MICROSCOPIC-ADD ON - Abnormal; Notable for the following:    Squamous Epithelial / LPF FEW (*)    All other components within normal limits  CBG MONITORING, ED - Abnormal; Notable for the following:    Glucose-Capillary 53 (*)    All other components within normal limits  CBG MONITORING, ED    Imaging Review Dg Thoracic Spine 2 View  11/21/2013   CLINICAL DATA:  Generalized mid back pain since yesterday without trauma. Renal transplant last year.  EXAM: THORACIC SPINE - 2 VIEW; LUMBAR SPINE - COMPLETE 4+ VIEW  COMPARISON:  Acute abdomen series of 12/23/2012.  FINDINGS: Thoracic spine: Minimal S-shaped thoracic spine curvature. Normal paraspinous contours.  The  lateral view images from approximately the top of T2 to the bottom of  L3. Maintenance of vertebral body height and alignment across these levels. Intervertebral disc heights are maintained.  Swimmer's view suboptimal, as the C2 level is not included. No gross vertebral body height loss in the upper thoracic spine.  Lumbar spine: Five lumbar type vertebral bodies. Sacroiliac joints are symmetric. Vascular calcifications. Moderate amount of ascending colonic stool. Maintenance of vertebral body height and alignment. Intervertebral disc heights are maintained.  IMPRESSION: No acute findings in the thoracic or lumbar spine.   Electronically Signed   By: Abigail Miyamoto M.D.   On: 11/21/2013 10:36   Dg Lumbar Spine Complete  11/21/2013   CLINICAL DATA:  Generalized mid back pain since yesterday without trauma. Renal transplant last year.  EXAM: THORACIC SPINE - 2 VIEW; LUMBAR SPINE - COMPLETE 4+ VIEW  COMPARISON:  Acute abdomen series of 12/23/2012.  FINDINGS: Thoracic spine: Minimal S-shaped thoracic spine curvature. Normal paraspinous contours.  The lateral view images from approximately the top of T2 to the bottom of L3. Maintenance of vertebral body height and alignment across these levels. Intervertebral disc heights are maintained.  Swimmer's view suboptimal, as the C2 level is not included. No gross vertebral body height loss in the upper thoracic spine.  Lumbar spine: Five lumbar type vertebral bodies. Sacroiliac joints are symmetric. Vascular calcifications. Moderate amount of ascending colonic stool. Maintenance of vertebral body height and alignment. Intervertebral disc heights are maintained.  IMPRESSION: No acute findings in the thoracic or lumbar spine.   Electronically Signed   By: Abigail Miyamoto M.D.   On: 11/21/2013 10:36     EKG Interpretation None      MDM   Final diagnoses:  Back pain, acute  Trichomonas infection      Patient with normal neuro exam and no evidence of fracture or infection. Urine positive for trich.  Discussed with patient and will treat  trich and rx for percocet.  Return precautions and need for close follow up discussed and patient voices understanding.   Shaune Pollack, MD 11/21/13 Steger Taft Worthing, MD 11/22/13 [phone]

## 2013-11-21 NOTE — ED Notes (Signed)
Meal tray ordered 

## 2013-11-21 NOTE — ED Notes (Signed)
Pt c/o intermittent back pain onset yesterday. Pt reports pain now constant. Pt has history of kidney transplant August 2014. Pt denies recent injury or urinary problems.

## 2013-11-21 NOTE — ED Notes (Signed)
MD at bedside. 

## 2013-11-25 ENCOUNTER — Ambulatory Visit: Payer: Self-pay | Admitting: Specialist

## 2014-02-16 ENCOUNTER — Ambulatory Visit: Payer: Self-pay | Admitting: Specialist

## 2014-02-16 LAB — CBC WITH DIFFERENTIAL/PLATELET
Basophil #: 0 10*3/uL (ref 0.0–0.1)
Basophil %: 0.5 %
Eosinophil #: 0.1 10*3/uL (ref 0.0–0.7)
Eosinophil %: 0.9 %
HCT: 38 % (ref 35.0–47.0)
HGB: 11.8 g/dL — ABNORMAL LOW (ref 12.0–16.0)
Lymphocyte #: 0.5 10*3/uL — ABNORMAL LOW (ref 1.0–3.6)
Lymphocyte %: 7.7 %
MCH: 25.7 pg — ABNORMAL LOW (ref 26.0–34.0)
MCHC: 31 g/dL — ABNORMAL LOW (ref 32.0–36.0)
MCV: 83 fL (ref 80–100)
Monocyte #: 0.6 x10 3/mm (ref 0.2–0.9)
Monocyte %: 8.7 %
Neutrophil #: 5.3 10*3/uL (ref 1.4–6.5)
Neutrophil %: 82.2 %
Platelet: 186 10*3/uL (ref 150–440)
RBC: 4.59 10*6/uL (ref 3.80–5.20)
RDW: 13.8 % (ref 11.5–14.5)
WBC: 6.5 10*3/uL (ref 3.6–11.0)

## 2014-02-25 ENCOUNTER — Ambulatory Visit: Payer: Self-pay | Admitting: Specialist

## 2014-02-25 LAB — BASIC METABOLIC PANEL
Anion Gap: 6 — ABNORMAL LOW (ref 7–16)
BUN: 19 mg/dL — ABNORMAL HIGH (ref 7–18)
Calcium, Total: 9.6 mg/dL (ref 8.5–10.1)
Chloride: 103 mmol/L (ref 98–107)
Co2: 28 mmol/L (ref 21–32)
Creatinine: 1.06 mg/dL (ref 0.60–1.30)
EGFR (African American): >60
EGFR (Non-African Amer.): >60
Glucose: 213 mg/dL — ABNORMAL HIGH (ref 65–99)
Osmolality: 282 (ref 275–301)
Potassium: 4.7 mmol/L (ref 3.5–5.1)
Sodium: 137 mmol/L (ref 136–145)

## 2014-05-05 ENCOUNTER — Other Ambulatory Visit: Payer: Self-pay

## 2014-05-05 DIAGNOSIS — Z1231 Encounter for screening mammogram for malignant neoplasm of breast: Secondary | ICD-10-CM

## 2014-05-26 ENCOUNTER — Encounter (HOSPITAL_COMMUNITY): Payer: Self-pay | Admitting: Vascular Surgery

## 2014-06-09 ENCOUNTER — Ambulatory Visit
Admission: RE | Admit: 2014-06-09 | Discharge: 2014-06-09 | Disposition: A | Discharge status: Home / Self Care | Payer: Medicare Other | Source: Ambulatory Visit

## 2014-06-09 DIAGNOSIS — Z1231 Encounter for screening mammogram for malignant neoplasm of breast: Secondary | ICD-10-CM

## 2014-06-30 ENCOUNTER — Encounter (HOSPITAL_COMMUNITY): Payer: Self-pay | Admitting: Vascular Surgery

## 2014-08-01 ENCOUNTER — Telehealth: Payer: Self-pay | Admitting: Internal Medicine

## 2014-08-01 NOTE — Telephone Encounter (Signed)
lt. mess regard appt on 08/22/14 at 10;30am w/Mohamed [id number] Referring Dr. Vista Lawman

## 2014-08-04 ENCOUNTER — Telehealth: Payer: Self-pay | Admitting: Internal Medicine

## 2014-08-04 NOTE — Telephone Encounter (Signed)
Called [facility], [place] to send over labs. Lt. Msg. TG

## 2014-08-19 ENCOUNTER — Other Ambulatory Visit: Payer: Self-pay | Admitting: *Deleted

## 2014-08-19 DIAGNOSIS — D72819 Decreased white blood cell count, unspecified: Secondary | ICD-10-CM

## 2014-08-22 ENCOUNTER — Encounter: Payer: Self-pay | Admitting: Internal Medicine

## 2014-08-22 ENCOUNTER — Other Ambulatory Visit (HOSPITAL_BASED_OUTPATIENT_CLINIC_OR_DEPARTMENT_OTHER): Payer: Medicare Other

## 2014-08-22 ENCOUNTER — Ambulatory Visit: Payer: Medicare Other

## 2014-08-22 ENCOUNTER — Other Ambulatory Visit: Payer: Self-pay | Admitting: Internal Medicine

## 2014-08-22 ENCOUNTER — Telehealth: Payer: Self-pay | Admitting: Internal Medicine

## 2014-08-22 ENCOUNTER — Other Ambulatory Visit: Payer: Self-pay | Admitting: Medical Oncology

## 2014-08-22 ENCOUNTER — Ambulatory Visit (HOSPITAL_BASED_OUTPATIENT_CLINIC_OR_DEPARTMENT_OTHER): Payer: Medicare Other | Admitting: Internal Medicine

## 2014-08-22 VITALS — BP 137/61 | HR 88 | Temp 98.3°F | Resp 18 | Ht 62.0 in | Wt 128.8 lb

## 2014-08-22 DIAGNOSIS — D72819 Decreased white blood cell count, unspecified: Secondary | ICD-10-CM

## 2014-08-22 DIAGNOSIS — D649 Anemia, unspecified: Secondary | ICD-10-CM

## 2014-08-22 LAB — CBC WITH DIFFERENTIAL/PLATELET
BASO%: 0.4 % (ref 0.0–2.0)
Basophils Absolute: 0 10*3/uL (ref 0.0–0.1)
EOS%: 0.4 % (ref 0.0–7.0)
Eosinophils Absolute: 0 10*3/uL (ref 0.0–0.5)
HCT: 33.9 % — ABNORMAL LOW (ref 34.8–46.6)
HGB: 10.2 g/dL — ABNORMAL LOW (ref 11.6–15.9)
LYMPH%: 6.1 % — ABNORMAL LOW (ref 14.0–49.7)
MCH: 23.8 pg — ABNORMAL LOW (ref 25.1–34.0)
MCHC: 30.1 g/dL — ABNORMAL LOW (ref 31.5–36.0)
MCV: 79.2 fL — ABNORMAL LOW (ref 79.5–101.0)
MONO#: 0.4 10*3/uL (ref 0.1–0.9)
MONO%: 9.6 % (ref 0.0–14.0)
NEUT#: 3.7 10*3/uL (ref 1.5–6.5)
NEUT%: 83.5 % — ABNORMAL HIGH (ref 38.4–76.8)
Platelets: 164 10*3/uL (ref 145–400)
RBC: 4.28 10*6/uL (ref 3.70–5.45)
RDW: 14.9 % — ABNORMAL HIGH (ref 11.2–14.5)
WBC: 4.4 10*3/uL (ref 3.9–10.3)
lymph#: 0.3 10*3/uL — ABNORMAL LOW (ref 0.9–3.3)

## 2014-08-22 LAB — COMPREHENSIVE METABOLIC PANEL (CC13)
ALT: 15 U/L (ref 0–55)
AST: 25 U/L (ref 5–34)
Albumin: 3.3 g/dL — ABNORMAL LOW (ref 3.5–5.0)
Alkaline Phosphatase: 547 U/L — ABNORMAL HIGH (ref 40–150)
Anion Gap: 13 mEq/L — ABNORMAL HIGH (ref 3–11)
BUN: 21.6 mg/dL (ref 7.0–26.0)
CO2: 18 mEq/L — ABNORMAL LOW (ref 22–29)
Calcium: 9.5 mg/dL (ref 8.4–10.4)
Chloride: 105 mEq/L (ref 98–109)
Creatinine: 1.3 mg/dL — ABNORMAL HIGH (ref 0.6–1.1)
EGFR: 60 mL/min/{1.73_m2} — ABNORMAL LOW (ref >90)
Glucose: 222 mg/dl — ABNORMAL HIGH (ref 70–140)
Potassium: 4.3 mEq/L (ref 3.5–5.1)
Sodium: 137 mEq/L (ref 136–145)
Total Bilirubin: 0.41 mg/dL (ref 0.20–1.20)
Total Protein: 6.6 g/dL (ref 6.4–8.3)

## 2014-08-22 LAB — IRON AND TIBC CHCC
%SAT: 10 % — ABNORMAL LOW (ref 21–57)
Iron: 21 ug/dL — ABNORMAL LOW (ref 41–142)
TIBC: 215 ug/dL — ABNORMAL LOW (ref 236–444)
UIBC: 194 ug/dL (ref 120–384)

## 2014-08-22 LAB — LACTATE DEHYDROGENASE (CC13): LDH: 452 U/L — ABNORMAL HIGH (ref 125–245)

## 2014-08-22 LAB — FERRITIN CHCC: Ferritin: 1628 ng/ml — ABNORMAL HIGH (ref 9–269)

## 2014-08-22 LAB — TECHNOLOGIST REVIEW

## 2014-08-22 NOTE — Progress Notes (Signed)
[place] [phone]   [phone]  CONSULT NOTE  REFERRING PHYSICIAN: Dr. Vista Lawman  REASON FOR CONSULTATION:  44 years old African-American female with leukocytopenia.  HPI [name] is a 44 y.o. female with past medical history significant for multiple medical problems including history of diabetes mellitus, hypertension, dyslipidemia, gout, end stage renal disease status post kidney transplant in August 2014, history of gastric ulcer as well as history of anemia. The patient was seen recently by her primary care physician Dr. Vista Lawman for routine evaluation and CBC performed at that time showed persistent leukocytopenia. She had repeat a CBC performed on 11/21/2013 that showed total white blood count 2.8 with absolute neutrophil count of 1.0. Her hemoglobin was 11.4 and hematocrit 35.4% with normal platelets count of 190,000. The patient was also seen recently by her nephrologist is Dr. Posey Pronto and had similar results. She was referred to me today for evaluation and recommendation regarding her persistent leukocytopenia. The patient is currently on immunosuppressive medication for the kidney transplant including tacrolimus, mycophenolate as well as prednisone 5 mg by mouth daily in addition to prophylactic Bactrim. She is also on Pravachol for dyslipidemia. She was not feeling well recently and was self treated with Aleve. The patient denied having any significant complaints today. She has no fever or chills, no nausea or vomiting or change in her bowel movement. The patient denied having any significant chest pain, shortness of breath, cough or hemoptysis. She has no significant weight loss or night sweats. Family history significant for a father with hypertension and coronary disease, mother had skin cancer and hypertension. The patient is single and has no children. She is currently full-time student studying business administration. She has no  history of smoking, alcohol or drug abuse. HPI  Past Medical History  Diagnosis Date  . Retinopathy   . Metabolic bone disease   . Anemia   . CHF (congestive heart failure)   . Pneumonia   . Blood transfusion     pt denies  . Chronic kidney disease     M-W-F  . Hypertension     sees Dr. Carolin Guernsey  . Diabetes mellitus     "controlled with diet"  . Hemodialysis patient     Past Surgical History  Procedure Laterality Date  . Insertion of dialysis catheter  10/04/2011    Procedure: INSERTION OF DIALYSIS CATHETER;  Surgeon: Angelia Mould, MD;  Location: [place];  Service: Vascular;  Laterality: Right;  insertion of dialysis catheter right internal jugular  . Av fistula placement  10/04/2011    Procedure: INSERTION OF ARTERIOVENOUS (AV) GORE-TEX GRAFT ARM;  Surgeon: Angelia Mould, MD;  Location: [place];  Service: Vascular;  Laterality: Left;  Insertion left upper arm Arteriovenous goretex graft  . Avgg removal  10/04/2011    Procedure: REMOVAL OF ARTERIOVENOUS GORETEX GRAFT ([place]);  Surgeon: Elam Dutch, MD;  Location: [place];  Service: Vascular;  Laterality: Left;  . Av fistula placement  10/29/2011    Procedure: ARTERIOVENOUS (AV) FISTULA CREATION;  Surgeon: Angelia Mould, MD;  Location[facility] OR;  Service: Vascular;  Laterality: Right;  Creation Right Arteriovenous Fistula   . Revison of arteriovenous fistula  06/23/2012    Procedure: REVISON OF ARTERIOVENOUS FISTULA;  Surgeon: Angelia Mould, MD;  Location: [place];  Service: Vascular;  Laterality: Right;  Ultrasound guided  . Insertion of dialysis catheter  06/23/2012    Procedure: INSERTION OF DIALYSIS CATHETER;  Surgeon: Angelia Mould, MD;  Location: [place];  Service: Vascular;  Laterality: N/A;  Ultrasound guided  . Patch angioplasty  06/23/2012    Procedure: PATCH ANGIOPLASTY;  Surgeon: Angelia Mould, MD;  Location: [facility] OR;  Service: Vascular;  Laterality: Right;  . Esophagogastroduodenoscopy  N/A 12/24/2012    Procedure: ESOPHAGOGASTRODUODENOSCOPY (EGD);  Surgeon: Milus Banister, MD;  Location: [place];  Service: Endoscopy;  Laterality: N/A;  . Kidney transplant    . Finger amputation    . Shuntogram N/A 04/06/2012    Procedure: Earney Mallet;  Surgeon: Angelia Mould, MD;  Location: [facility] CATH LAB;  Service: Cardiovascular;  Laterality: N/A;    Family History  Problem Relation Age of Onset  . Malignant hyperthermia Mother   . Malignant hyperthermia Father   . Anesthesia problems Neg Hx   . Thyroid disease Mother   . Diabetes Brother   . Heart disease Brother     CHF  . Hypertension Mother   . Hypertension Father     Social History History  Substance Use Topics  . Smoking status: Never Smoker   . Smokeless tobacco: Never Used  . Alcohol Use: No    No Known Allergies  Current Outpatient Prescriptions  Medication Sig Dispense Refill  . albuterol (PROVENTIL HFA;VENTOLIN HFA) 108 (90 BASE) MCG/ACT inhaler Inhale 1 puff into the lungs every 6 (six) hours as needed for wheezing or shortness of breath.    Marland Kitchen amLODipine (NORVASC) 10 MG tablet Take 10 mg by mouth at bedtime.     Marland Kitchen aspirin 325 MG EC tablet Take 325 mg by mouth daily.    . cloNIDine (CATAPRES) 0.1 MG tablet Take 0.2 mg by mouth 2 (two) times daily.    . insulin aspart (NOVOLOG) 100 UNIT/ML injection Inject 0-18 Units into the skin 3 (three) times daily before meals. Per sliding scale: aprox- >50 or 70; 0 units, 50 or 70-150 7 units, 151-200; 9 units, 201-250; 12 units, 251-300; 15 units, 301->; 18 units    . insulin detemir (LEVEMIR) 100 UNIT/ML injection Inject 14-22 Units into the skin 2 (two) times daily. 14 units in the morning and 22 units at bedtime    . labetalol (NORMODYNE) 300 MG tablet Take 300 mg by mouth 3 (three) times daily.    . magnesium oxide (MAG-OX) 400 MG tablet Take 400 mg by mouth daily.    . mycophenolate (MYFORTIC) 180 MG EC tablet Take 540 mg by mouth 2 (two) times daily.     .  Pantoprazole Sodium (PROTONIX PO) Take 40 mg by mouth daily.     . pravastatin (PRAVACHOL) 20 MG tablet Take 20 mg by mouth at bedtime.     . predniSONE (DELTASONE) 5 MG tablet Take 5 mg by mouth daily with breakfast.    . sodium bicarbonate 325 MG tablet Take 650 mg by mouth daily.     Marland Kitchen sulfamethoxazole-trimethoprim (BACTRIM,SEPTRA) 400-80 MG per tablet Take 1 tablet by mouth 3 (three) times a week.    . tacrolimus (PROGRAF) 1 MG capsule Take 1 mg by mouth 2 (two) times daily.     No current facility-administered medications for this visit.    Review of Systems  Constitutional: negative Eyes: negative Ears, nose, mouth, throat, and face: negative Respiratory: negative Cardiovascular: negative Gastrointestinal: negative Genitourinary:negative Integument/breast: negative Hematologic/lymphatic: negative Musculoskeletal:negative Neurological: negative Behavioral/Psych: negative Endocrine: negative Allergic/Immunologic: negative  Physical Exam  [id number], healthy, no distress, well nourished and well developed SKIN: skin color, texture, turgor are  normal, no rashes or significant lesions HEAD: Normocephalic, No masses, lesions, tenderness or abnormalities EYES: normal, PERRLA EARS: External ears normal, Canals clear OROPHARYNX:no exudate, no erythema and lips, buccal mucosa, and tongue normal  NECK: supple, no adenopathy, no JVD LYMPH:  no palpable lymphadenopathy, no hepatosplenomegaly BREAST:not examined LUNGS: clear to auscultation , and palpation HEART: regular rate & rhythm and no murmurs ABDOMEN:abdomen soft, non-tender, obese, normal bowel sounds and no masses or organomegaly BACK: Back symmetric, no curvature., No CVA tenderness EXTREMITIES:no joint deformities, effusion, or inflammation, no edema, no skin discoloration  NEURO: alert & oriented x 3 with fluent speech, no focal motor/sensory deficits  PERFORMANCE STATUS: ECOG 1  LABORATORY DATA: Lab Results    Component Value Date   WBC 4.4 08/22/2014   HGB 10.2* 08/22/2014   HCT 33.9* 08/22/2014   MCV 79.2* 08/22/2014   PLT 164 08/22/2014      Chemistry      Component Value Date/Time   NA 139 11/21/2013 0950   K 4.3 11/21/2013 0950   CL 100 11/21/2013 0950   CO2 25 11/21/2013 0950   BUN 23 11/21/2013 0950   CREATININE 0.98 11/21/2013 0950      Component Value Date/Time   CALCIUM 10.4 11/21/2013 0950   CALCIUM 8.5 04/02/2011 0514   ALKPHOS 66 11/21/2013 0950   AST 20 11/21/2013 0950   ALT 14 11/21/2013 0950   BILITOT <0.2* 11/21/2013 0950       RADIOGRAPHIC STUDIES: No results found.  ASSESSMENT: This is a very pleasant 44 years old African-American female with multiple medical problems including history of end-stage renal disease status post kidney transplant and currently on immunosuppressive medications. She presented for evaluation of leukocytopenia which is most likely secondary to her immunosuppressive medications but this is completely resolved at this point. Her CBC today showed normal white blood count as well as normal absolute neutrophil count. The patient continues to have persistent anemia questionable for anemia of chronic disease plus/minus iron deficiency.   PLAN: I had a lengthy discussion with the patient today about her current condition and treatment options. I ordered several studies for evaluation of her leukocytopenia and anemia including repeat CBC, comprehensive metabolic panel, LDH, iron study, ferritin, serum folate, vitamin B 12, ANA, acute hepatitis panel, rheumatoid factor as well as HIV test. I would see the patient back for follow-up visit in one month with repeat CBC for reevaluation of her disease and discussion of the pending lab results. She was advised to continue her routine follow-up visit with her primary care physician as well as Dr. Posey Pronto her nephrologist. The patient was also advised to call immediately if she has any concerning symptoms  in the interval.  The patient voices understanding of current disease status and treatment options and is in agreement with the current care plan.  All questions were answered. The patient knows to call the clinic with any problems, questions or concerns. We can certainly see the patient much sooner if necessary.  Thank you so much for allowing me to participate in the care of [name]. I will continue to follow up the patient with you and assist in her care.  I spent 40 minutes counseling the patient face to face. The total time spent in the appointment was 60 minutes.  Disclaimer: This note was dictated with voice recognition software. Similar sounding words can inadvertently be transcribed and may not be corrected upon review.   Rosalinda Seaman K. August 22, 2014, 12:44 PM

## 2014-08-22 NOTE — Progress Notes (Signed)
Checked in new pt with no financial concerns at this time.  Pt has 2 insurances so financial assistance may not be needed but she has my card for any billing questions or concerns.  ° °

## 2014-08-22 NOTE — Telephone Encounter (Signed)
gv and printed appt sched and avs for pt for April  °

## 2014-08-23 LAB — FOLATE: Folate: 19.6 ng/mL

## 2014-08-23 LAB — HEPATITIS PANEL, ACUTE
HCV Ab: NEGATIVE
Hep A IgM: NONREACTIVE
Hep B C IgM: NONREACTIVE
Hepatitis B Surface Ag: NEGATIVE

## 2014-08-23 LAB — HIV ANTIBODY (ROUTINE TESTING W REFLEX): HIV 1&2 Ab, 4th Generation: NONREACTIVE

## 2014-08-23 LAB — RHEUMATOID FACTOR: Rhuematoid fact SerPl-aCnc: 16 IU/mL — ABNORMAL HIGH (ref <=14)

## 2014-08-23 LAB — ANA: Anti Nuclear Antibody(ANA): NEGATIVE

## 2014-08-23 LAB — VITAMIN B12: Vitamin B-12: 876 pg/mL (ref 211–911)

## 2014-09-20 ENCOUNTER — Other Ambulatory Visit: Payer: Self-pay

## 2014-09-20 ENCOUNTER — Ambulatory Visit: Payer: Self-pay | Admitting: Internal Medicine

## 2014-10-07 NOTE — Op Note (Signed)
PATIENT NAME:  [name], [name] MR#:  [id number] DATE OF BIRTH:  1970/08/10  DATE OF PROCEDURE:  12/09/2012  PREOPERATIVE DIAGNOSES:   1.  Ischemia of right hand.  2.  Complication of arteriovenous dialysis device with steal syndrome.  3.  End-stage renal disease requiring hemodialysis.  4.  Atherosclerotic occlusive disease of the right upper extremity.   POSTOPERATIVE DIAGNOSES:   1.  Ischemia of right hand.  2.  Complication of arteriovenous dialysis device with steal syndrome.  3.  End-stage renal disease requiring hemodialysis.  4.  Atherosclerotic occlusive disease of the right upper extremity.   PROCEDURES PERFORMED:   1.  Arch aortogram.  2.  Right arm angiography, third order catheter placement.   SURGEON:  Katha Cabal, MD.  SEDATION:  Versed 3 mg plus fentanyl 100 mcg administered IV. Continuous ECG, pulse oximetry and cardiopulmonary monitoring was performed throughout the entire procedure by the interventional radiology nurse. Total sedation time was about 45 minutes.   ACCESS:  A 5 French sheath, right common femoral artery.   FLUOROSCOPY TIME:  3.9 minutes.   CONTRAST USED:  Isovue, 49 mL.   INDICATIONS:  [name] is a 44 year old woman maintained on hemodialysis with a right arm fistula who has developed an ischemic ulceration and chronic pain of her right hand. Risks and benefits for angiography with the hope of intervention were reviewed. All questions answered. The patient has agreed to proceed.   DESCRIPTION OF PROCEDURE:  The patient is taken to special procedures and placed in the supine position. After adequate sedation is achieved, both groins are prepped and draped in sterile fashion. Ultrasound is placed in a sterile sleeve. Ultrasound is utilized secondary to lack of appropriate landmarks and to avoid vascular injury. Under direct ultrasound visualization, common femoral artery is identified. It is echolucent. Image is recorded for the permanent  record. Puncture was made with a micropuncture needle under direct visualization. Microwire followed by MicroSheath and J-wire followed by 5 French sheath is then placed. A pigtail catheter is then advanced up to the ascending aorta and LAO projection of the arch is obtained. The pigtail catheter is exchanged for an H1 catheter over a stiff-angled Glidewire and the catheter is negotiated into the innominate and then the subclavian an injection of contrast is used to image the distal subclavian and axillary. The pigtail catheter is then repositioned into the axillary artery and the brachial artery is imaged. Essentially all of the contrast flows into the fistula with almost no contrast going distally toward the hand. Wire and catheter are then negotiated past the origin of the fistula into the trifurcation of the arm and selective images are obtained. After review of these images with some magnified views at the antecubital fossa, the catheter is removed. Mynx device is deployed, but it failed and pressure is held. There are no immediate complications.   INTERPRETATION:  Arch is free of hemodynamically significant disease. It is a type I arch. There is no evidence of ostial stenosis of the great vessels. The right innominate, subclavian, axillary and brachial arteries are widely patent. The fistula is widely patent. It is measuring approximately 3 to 4 mm in its origin and then dilates nicely and is an excellent fistula free of strictures or stenoses. However, as noted, essentially zero contrast goes distally toward the hand on injection from the level of the axilla suggesting profound steal; that is, corroborating profound steal. With the catheter positioned just past the fistula anastomosis, the forearm arteries  are then imaged. The patient has a patent radial artery, although it is diffusely diseased. There are no focal stenoses. It does fill an incomplete palmar arch with poor filling of the fifth digit. The  interosseous is patent down to the wrist and collateralizes at that level, but again is quite small. The ulnar artery occludes at the level of the wrist and does not contribute to the palmar arch or the digits. The distal brachial artery is patent.   Considering the circumstances, the diffuse nonfocal disease within the forearm arteries as well as their small size and the acceptable distal target in the distal brachial artery, a drill procedure with saphenous vein will be this patient's best option and she will be followed up in the office expeditiously to vein map her for a drill procedure.    ____________________________ Katha Cabal, MD ggs:si D: 12/09/2012 17:11:51 ET T: 12/09/2012 22:37:44 ET JOB#: [id number]  cc: Katha Cabal, MD, <Dictator> Benito Mccreedy, MD Katha Cabal MD ELECTRONICALLY SIGNED 12/22/2012 14:29

## 2014-10-07 NOTE — Op Note (Signed)
PATIENT NAME:  [name], [name] MR#:  [id number] DATE OF BIRTH:  02-Mar-1971  DATE OF PROCEDURE:  12/31/2012  PREOPERATIVE DIAGNOSES: 1.  End-stage renal disease.  2.  Steal syndrome, right arm, with functional right brachiocephalic arteriovenous fistula.  3.  Previous steal syndrome, left arm, with previous fistula ligation.  4.  Hypertension.  5.  Hyperlipidemia.  6.  Diabetes.   POSTOPERATIVE DIAGNOSES:  1.  End-stage renal disease.  2.  Steal syndrome, right arm, with functional right brachiocephalic arteriovenous fistula.  3.  Previous steal syndrome, left arm, with previous fistula ligation.  4.  Hypertension.  5.  Hyperlipidemia.  6.  Diabetes.   PROCEDURE:  Right arm DRIL procedure with the use of a cadaveric reverse saphenous vein graft.   SURGEON: Algernon Huxley, MD   ANESTHESIA: General.   ESTIMATED BLOOD LOSS: Approximately 25 mL.   INDICATION FOR PROCEDURE: A 44 year old African American female with end-stage renal disease. She has severe steal syndrome of the right arm with a nonhealing ulceration of the right third finger. This has worsened, and she is in extreme pain, and noninvasive studies and angiogram both have confirmed steal syndrome.  She has a right brachiocephalic AV fistula present. We discussed options, including ligation and banding of the fistula, but as she has already had to have a fistula removed from the left arm, her options for permanent dialysis access are somewhat limited, and she is young and would like to keep her functional fistula running, and a DRIL procedure would accomplish this. The risks and benefits were discussed. Informed consent was obtained.  DESCRIPTION OF THE PROCEDURE: The patient was brought to the operative suite, and after an adequate level of general anesthesia was obtained, the right upper extremity was sterilely prepped and draped and a sterile surgical field was created. I dissected out the brachial artery in the mid upper arm and  the brachial artery with the radial and ulnar bifurcation just below the antecubital fossa.  Vessel loops were placed to control the vessel proximally and distally in both locations, and the patient was heparinized.  I tunneled with a long Kelly clamp and systemically heparinized the patient. The cadaveric vein graft was then brought onto the field. It was flushed and prepared appropriately.   It was then marked for orientation. It was then tunneled.  Control was then pulled up on the vessel loops on the proximal control site. An anterior wall arteriotomy was created with a #11 blade and extended with Potts scissors, and the vein was cut and beveled to match the arteriotomy. An anastomosis was created in a running fashion with a 6-0 Prolene suture. We flushed through the graft with good pulsatile flow and then clamped the graft with a bulldog clamp. The vein graft was then cut and beveled to an appropriate length to match an arteriotomy at deep distal brachial artery and radial ulnar bifurcation. This fitted just onto the radial artery, which was the dominant runoff distally. The vessel was flushed and deaired prior to release of control. On release, two 6-0 Prolene patch sutures were used for hemostasis, and hemostasis was achieved. There was again good pulsatile flow through the graft, and the fistula still had a nice palpable thrill present.  I then ligated the brachial artery just proximal to the distal anastomosis with a 0 silk tie. Surgicel and Evicel topical hemostatic agents were placed, and hemostasis was complete. The wounds was then closed with a running 3-0 Vicryl and a 4-0 Monocryl. Dermabond  was placed as a dressing. The patient tolerated the procedure well and was taken to the recovery room in stable condition.    ____________________________ Algernon Huxley, MD jsd:cb D: 12/31/2012 16:11:00 ET T: 12/31/2012 16:54:58 ET JOB#: [id number]  cc: Algernon Huxley, MD, <Dictator> Algernon Huxley  MD ELECTRONICALLY SIGNED 01/04/2013 13:52

## 2014-10-07 NOTE — Op Note (Signed)
PATIENT NAME:  Joy, [name] MR#:  [id number] DATE OF BIRTH:  06-28-1970  DATE OF PROCEDURE:  03/02/2013  PREOPERATIVE DIAGNOSIS: Dry gangrene right long finger.   POSTOPERATIVE DIAGNOSIS: Dry gangrene right long finger.   PROCEDURE: Amputation right long finger through distal two thirds of the proximal phalanx.   SURGEON: Christophe Louis, M.D.   ANESTHESIA: General.   COMPLICATIONS: None.   TOURNIQUET TIME: Approximately 35 minutes.   DESCRIPTION OF PROCEDURE: One gram of Ancef was given intravenously prior to the procedure. General anesthesia is induced. The right upper extremity is thoroughly prepped with Hibiclens and Betadine and draped in standard sterile fashion. An Esmarch bandage is used to wrap out the hand and forearm, and the Esmarch is left about the proximal forearm as a tourniquet to protect the shunt in the right upper arm. Under loupe magnification, the amputation is performed through the distal two thirds of the middle phalanx. The level of the bone cut was determined by the remaining closable skin after excising the gangrenous portion of the finger. The dorsal skin after thorough irrigation multiple times is shaped in a flap to pull over volarly and then is closed with 4-0 nylon sutures. Soft bulky dressing is applied. The tourniquet is released. The patient is returned to the recovery room in satisfactory condition having tolerated the procedure quite well.   ____________________________ [name], MD ces:sg D: 03/03/2013 08:26:46 ET T: 03/03/2013 09:02:33 ET JOB#: [id number]  cc: [name], MD, <Dictator> Algernon Huxley, MD  [name] MD ELECTRONICALLY SIGNED 03/03/2013 14:52

## 2014-10-07 NOTE — Op Note (Signed)
PATIENT NAME:  [name], [name] MR#:  [id number] DATE OF BIRTH:  1970/09/20  DATE OF PROCEDURE:  04/19/2013  PREOPERATIVE DIAGNOSES:  1.  End-stage renal disease with previous right arm arteriovenous fistula.  2.  Steal syndrome, right arm with previous DRIL bypass, which is now occluded.  3.  Status post renal transplant with restoration of renal function.  4.  Hypertension.   POSTOPERATIVE DIAGNOSES: 1.  End-stage renal disease with previous right arm arteriovenous fistula.  2.  Steal syndrome, right arm with previous DRIL bypass, which is now occluded.  3.  Status post renal transplant with restoration of renal function.  4.  Hypertension.   PROCEDURES:  1.  Catheter placement into right ulnar artery from right femoral approach.  2.  Thoracic aortogram and right upper extremity angiogram.  3.  Percutaneous transluminal angioplasty of brachial artery proximal to the bypass, the DRIL bypass and the brachial artery distal to the bypass with 4 mm diameter angioplasty balloon.  4.  Percutaneous transluminal angioplasty of proximal anastomosis, proximal brachial artery and midportion of the DRIL bypass with 5 mm diameter angioplasty balloon.  5.  Viabahn covered stent placement to the DRIL bypass with a 5 mm diameter Viabahn stent.  6.  StarClose closure device, right femoral artery.  7.  Catheter directed thrombolysis with 4 mg of TPA to the brachial artery and DRIL bypass. 8.  Mechanical rheolytic thrombectomy to the brachial artery and DRIL bypass using the AngioJet Omni catheter.   SURGEON: Algernon Huxley, M.D.   ANESTHESIA: Local with moderate conscious sedation.   ESTIMATED BLOOD LOSS: Approximately 25 mL.  FLUOROSCOPY TIME: 11 minutes.   CONTRAST USED: 68 mL.   INDICATION FOR PROCEDURE: This is a 44 year old, African American female with the above-mentioned issues. She has had to have a digital amputation for gangrenous changes to the right middle finger and has a right arm AV  fistula patent and present. She had a DRIL bypass performed for ischemia earlier this year while she was still on dialysis. Since that time, she has received a renal transplant and is no longer on dialysis. However, her DRIL bypass has occluded and she has a nonhealing ulceration at the site of a digital amputation on the right hand. We are trying to reopen the DRIL bypass to see if this will restore enough blood flow to improve her ischemia and if not, we will plan on lagging her fistula in the future. Risks and benefits were discussed. Informed consent was obtained.   DESCRIPTION OF PROCEDURE: The patient was brought to the vascular interventional radiology suite. Groins were shaved and prepped and a sterile surgical field was created. The right femoral head was localized with fluoroscopy, and the right femoral artery was accessed without difficulty with Seldinger needle. A J-wire and 5-French sheath were then placed. Pigtail catheter was placed into the ascending aorta and an LAO-projection aortogram was performed. This demonstrated a bovine configuration of the great vessels without significant stenosis. I then used a Headhunter catheter to selectively cannulate the innominate artery and advance into the right subclavian artery without difficulty. Imaging was performed in the right arm through the catheter. The subclavian artery and axillary artery were patent. The brachial artery had stenosis just proximal to the DRIL bypass that was likely at an area of clamp. The DRIL bypass had a short stump and was then occluded. The fistula was widely patent. I then heparinized the patient and placed a 6-French Shuttle sheath over a Magic torque wire.  With a Kumpe catheter, I was able to gain access to the DRIL bypass and cross the occlusion without difficulty and confirm intraluminal flow and advance the catheter into the ulnar artery distally.    I used the AngioJet Omni catheter to lace the brachial artery and DRIL  bypass with 4 mg of TPA in a power pulse spray fashion. This was allowed to dwell for 15 minutes and mechanical rheolytic thrombectomy was then performed. With this, there was still occlusion of the bypass. I balloon angioplastied the bypass with a 4 mm diameter angioplasty balloon from the brachial artery just distal to the antecubital fossa back into the brachial artery proximal to the bypass. This resulted in improvement. There was still an area of stenosis and thrombus in the mid to proximal portion of the bypass. This area back to the proximal anastomosis was treated with a 5 mm diameter angioplasty balloon, but no change was seen in the vascular flow was still going out the fistula. I elected to cover this area with a Viabahn covered stent. A 5 mm diameter x 5 cm length Viabahn stent was used only in the DRIL bypass itself, post dilated with a 5 mm balloon. Completion angiogram now showed the DRIL bypass to be patent and the outflow distally to be patent. However, the majority of the flow did still go to the fistula due to lower resistance. At this point, we will plan to see her back this week and do a non-invasive study in the office and if she continues to have signs of Steel, we would ligate her fistula. A StarClose closure device was then deployed in the right femoral artery with excellent hemostatic result. The patient tolerated the procedure well and was taken to the recovery room in stable condition.   ____________________________ Algernon Huxley, MD jsd:aw D: 04/19/2013 10:47:04 ET T: 04/19/2013 11:09:49 ET JOB#: [id number]  cc: Algernon Huxley, MD, <Dictator> Algernon Huxley MD ELECTRONICALLY SIGNED 04/29/2013 14:58

## 2014-10-08 NOTE — Op Note (Signed)
PATIENT NAME:  [name], [name] MR#:  [id number] DATE OF BIRTH:  1971/02/26  DATE OF PROCEDURE:  02/25/2014  PREOPERATIVE DIAGNOSIS: Amputation right long finger with protruding proximal phalanx.   POSTOPERATIVE DIAGNOSIS: Amputation right long finger with protruding proximal phalanx.   PROCEDURE: Revision of amputation, right long finger.   SURGEON: Christophe Louis, M.D.   ANESTHESIA: General.   COMPLICATIONS: None.   TOURNIQUET TIME: 20 minutes.   DESCRIPTION OF PROCEDURE: After adequate induction of general anesthesia, the right upper extremity was thoroughly prepped with alcohol and ChloraPrep and draped in standard sterile fashion. The extremity is wrapped out with the Esmarch bandage and pneumatic tourniquet elevated to 200 mmHg. Under loupe magnification, the rongeur is used to cut back the protruding bone from the amputation site of the right long finger. Soft tissue scar tissue is also removed with the knife and the rongeur. The wound is thoroughly irrigated multiple times. Digital block is performed using 0.5% plain Marcaine. The skin is then carefully closed with 4-0 nylon. A soft bulky dressing is applied. The patient is returned to the recovery room in satisfactory condition having tolerated the procedure quite well.  ____________________________ [name], MD ces:sb D: 02/25/2014 10:16:03 ET T: 02/25/2014 10:57:21 ET JOB#: [id number]  cc: [name], MD, <Dictator> [name] MD ELECTRONICALLY SIGNED 03/11/2014 10:04

## 2014-10-08 NOTE — Op Note (Signed)
PATIENT NAME:  [name], [name] MR#:  [id number] DATE OF BIRTH:  [birth year]  DATE OF PROCEDURE:  07/14/2013  PREOPERATIVE DIAGNOSES: 1.  Ischemic right upper extremity with gangrenous changes status post third digital amputation with poor wound healing.  2.  End-stage renal disease, status post distal revascularization with interval ligation procedure, right arm.  3.  Status post renal transplant.  4.  Hypertension.   POSTOPERATIVE DIAGNOSES: 1.  Ischemic right upper extremity with gangrenous changes status post third digital amputation with poor wound healing.  2.  End-stage renal disease, status post distal revascularization with interval ligation procedure, right arm.  3.  Status post renal transplant.  4.  Hypertension.   PROCEDURES PERFORMED: 1.  Ultrasound guidance for vascular access, right femoral artery.  2.  Catheter placement to right radial artery from right femoral approach.  3.  Thoracic aortogram and selective right upper extremity angiogram.  4.  Percutaneous transluminal angioplasty of distal DRIL anastomosis in distal brachial artery with 4 mm diameter angioplasty balloon.  5.  Percutaneous transluminal angioplasty of proximal DRIL anastomosis in brachial artery with 5 mm diameter angioplasty balloon.  6.  StarClose closure device, right femoral artery.   SURGEON: Algernon Huxley, MD   ANESTHESIA: Local with moderate conscious sedation.   ESTIMATED BLOOD LOSS: Approximately 25 mL.  FLUOROSCOPY TIME: Five minutes.   INDICATION FOR PROCEDURE: This is a 44 year old African American female well-known to [place] for her dialysis access needs and ischemia of the right upper extremity. She has undergone DRIL bypass in a previous intervention. Her digital amputation for gangrenous changes is poorly healing, and a noninvasive study showed elevated velocities at the proximal DRIL anastomosis. She is brought in for angiography for further evaluation and potential treatment. Risks and  benefits were discussed. Informed consent was obtained.   DESCRIPTION OF PROCEDURE: The patient was brought to the vascular and interventional radiology suite. Groins were shaved and prepped, and a sterile surgical field was created. The right femoral head was localized with fluoroscopy. The right femoral artery was visualized with ultrasound and found to be patent. It was then accessed under direct ultrasound guidance without difficulty with a Seldinger needle. A J-wire and 5-French sheath were placed. Pigtail catheter was placed in the ascending aorta, and an LAO projection thoracic aortogram was performed. This showed normal origin of the great vessels without significant stenosis. I then used a Headhunter catheter to selectively cannulate the innominate artery and advance into the right subclavian artery. 3000 units of intravenous heparin were given for systemic anticoagulation and imaging was performed, which demonstrated patent subclavian artery, axillary artery, and proximal brachial artery. At the proximal DRIL anastomosis into the brachial artery, there was some stenosis from what was likely intimal hyperplasia and previous manipulation. This was in the moderate range. Similar was seen at the distal anastomosis to the distal brachial artery just before the brachial bifurcation, and it also appeared to be in the moderate range. Both of these appeared to be no worse than 60% to 65%, but were irregular and likely limiting flow to her hand. I placed an 0.018 wire and crossed these lesions without difficulty. The distal lesion was treated with a 4 mm diameter angioplasty balloon in the distal DRIL bypass in distal brachial artery, and the separate and distinct proximal lesion was treated with a 5 mm diameter angioplasty balloon in the proximal DRIL bypass in the brachial artery. Completion angiogram showed improvement in the stenosis at both locations. There was still probably about  a 30% to  40% residual  stenosis at the proximal location and about a 20% stenosis in the distal location. These did not appear flow-limiting. Completion shots of her hand showed reasonably good flow, with the radial artery being dominant. The distal ulnar artery was occluded, with a patent digital vessel all the way to the amputated third finger. At this point, I elected to terminate the procedure. StarClose closure device was deployed in the usual fashion with an excellent hemostatic result. The patient tolerated the procedure well and was taken to the recovery room in stable condition.    ____________________________ Algernon Huxley, MD jsd:jcm D: 07/14/2013 13:58:14 ET T: 07/14/2013 15:23:23 ET JOB#: [id number]  cc: Algernon Huxley, MD, <Dictator> Algernon Huxley MD ELECTRONICALLY SIGNED 07/20/2013 11:05

## 2014-11-30 ENCOUNTER — Encounter (HOSPITAL_COMMUNITY): Payer: Self-pay | Admitting: Nurse Practitioner

## 2014-11-30 ENCOUNTER — Emergency Department (HOSPITAL_COMMUNITY)
Admission: EM | Admit: 2014-11-30 | Discharge: 2014-11-30 | Disposition: A | Discharge status: Home / Self Care | Payer: No Typology Code available for payment source | Attending: Emergency Medicine | Admitting: Emergency Medicine

## 2014-11-30 DIAGNOSIS — Z8739 Personal history of other diseases of the musculoskeletal system and connective tissue: Secondary | ICD-10-CM | POA: Diagnosis not present

## 2014-11-30 DIAGNOSIS — S3991XA Unspecified injury of abdomen, initial encounter: Secondary | ICD-10-CM | POA: Insufficient documentation

## 2014-11-30 DIAGNOSIS — I120 Hypertensive chronic kidney disease with stage 5 chronic kidney disease or end stage renal disease: Secondary | ICD-10-CM | POA: Diagnosis not present

## 2014-11-30 DIAGNOSIS — S3992XA Unspecified injury of lower back, initial encounter: Secondary | ICD-10-CM | POA: Insufficient documentation

## 2014-11-30 DIAGNOSIS — Z992 Dependence on renal dialysis: Secondary | ICD-10-CM | POA: Insufficient documentation

## 2014-11-30 DIAGNOSIS — N186 End stage renal disease: Secondary | ICD-10-CM | POA: Diagnosis not present

## 2014-11-30 DIAGNOSIS — E119 Type 2 diabetes mellitus without complications: Secondary | ICD-10-CM | POA: Diagnosis not present

## 2014-11-30 DIAGNOSIS — Z862 Personal history of diseases of the blood and blood-forming organs and certain disorders involving the immune mechanism: Secondary | ICD-10-CM | POA: Insufficient documentation

## 2014-11-30 DIAGNOSIS — Z8669 Personal history of other diseases of the nervous system and sense organs: Secondary | ICD-10-CM | POA: Insufficient documentation

## 2014-11-30 DIAGNOSIS — I509 Heart failure, unspecified: Secondary | ICD-10-CM | POA: Diagnosis not present

## 2014-11-30 DIAGNOSIS — Y998 Other external cause status: Secondary | ICD-10-CM | POA: Insufficient documentation

## 2014-11-30 DIAGNOSIS — Z7982 Long term (current) use of aspirin: Secondary | ICD-10-CM | POA: Diagnosis not present

## 2014-11-30 DIAGNOSIS — Z8701 Personal history of pneumonia (recurrent): Secondary | ICD-10-CM | POA: Insufficient documentation

## 2014-11-30 DIAGNOSIS — Z79899 Other long term (current) drug therapy: Secondary | ICD-10-CM | POA: Diagnosis not present

## 2014-11-30 DIAGNOSIS — Y9389 Activity, other specified: Secondary | ICD-10-CM | POA: Insufficient documentation

## 2014-11-30 DIAGNOSIS — Z7952 Long term (current) use of systemic steroids: Secondary | ICD-10-CM | POA: Insufficient documentation

## 2014-11-30 DIAGNOSIS — Y92410 Unspecified street and highway as the place of occurrence of the external cause: Secondary | ICD-10-CM | POA: Insufficient documentation

## 2014-11-30 DIAGNOSIS — Z794 Long term (current) use of insulin: Secondary | ICD-10-CM | POA: Diagnosis not present

## 2014-11-30 MED ORDER — CYCLOBENZAPRINE HCL 10 MG PO TABS: 10.0000 mg | ORAL_TABLET | Freq: Two times a day (BID) | ORAL | Status: DC | PRN
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
## 2014-12-12 ENCOUNTER — Other Ambulatory Visit: Payer: Self-pay

## 2015-01-25 DIAGNOSIS — D8989 Other specified disorders involving the immune mechanism, not elsewhere classified: Secondary | ICD-10-CM | POA: Diagnosis not present

## 2015-01-25 DIAGNOSIS — I120 Hypertensive chronic kidney disease with stage 5 chronic kidney disease or end stage renal disease: Secondary | ICD-10-CM | POA: Diagnosis not present

## 2015-01-25 DIAGNOSIS — Z7951 Long term (current) use of inhaled steroids: Secondary | ICD-10-CM | POA: Diagnosis not present

## 2015-01-25 DIAGNOSIS — Z794 Long term (current) use of insulin: Secondary | ICD-10-CM | POA: Diagnosis not present

## 2015-01-25 DIAGNOSIS — Z79899 Other long term (current) drug therapy: Secondary | ICD-10-CM | POA: Diagnosis not present

## 2015-01-25 DIAGNOSIS — E1122 Type 2 diabetes mellitus with diabetic chronic kidney disease: Secondary | ICD-10-CM | POA: Diagnosis not present

## 2015-01-25 DIAGNOSIS — Z992 Dependence on renal dialysis: Secondary | ICD-10-CM | POA: Diagnosis not present

## 2015-01-25 DIAGNOSIS — N186 End stage renal disease: Secondary | ICD-10-CM | POA: Diagnosis not present

## 2015-01-25 DIAGNOSIS — Z4822 Encounter for aftercare following kidney transplant: Secondary | ICD-10-CM | POA: Diagnosis not present

## 2015-01-25 DIAGNOSIS — Z940 Kidney transplant status: Secondary | ICD-10-CM | POA: Diagnosis not present

## 2015-02-01 DIAGNOSIS — Y841 Kidney dialysis as the cause of abnormal reaction of the patient, or of later complication, without mention of misadventure at the time of the procedure: Secondary | ICD-10-CM | POA: Diagnosis not present

## 2015-02-01 DIAGNOSIS — M79609 Pain in unspecified limb: Secondary | ICD-10-CM | POA: Diagnosis not present

## 2015-02-01 DIAGNOSIS — E119 Type 2 diabetes mellitus without complications: Secondary | ICD-10-CM | POA: Diagnosis not present

## 2015-02-09 DIAGNOSIS — Z940 Kidney transplant status: Secondary | ICD-10-CM | POA: Diagnosis not present

## 2015-02-15 DIAGNOSIS — Y841 Kidney dialysis as the cause of abnormal reaction of the patient, or of later complication, without mention of misadventure at the time of the procedure: Secondary | ICD-10-CM | POA: Diagnosis not present

## 2015-02-15 DIAGNOSIS — M7989 Other specified soft tissue disorders: Secondary | ICD-10-CM | POA: Diagnosis not present

## 2015-02-15 DIAGNOSIS — E119 Type 2 diabetes mellitus without complications: Secondary | ICD-10-CM | POA: Diagnosis not present

## 2015-02-15 DIAGNOSIS — E785 Hyperlipidemia, unspecified: Secondary | ICD-10-CM | POA: Diagnosis not present

## 2015-04-02 ENCOUNTER — Emergency Department (HOSPITAL_COMMUNITY): Payer: Managed Care, Other (non HMO)

## 2015-04-02 ENCOUNTER — Encounter (HOSPITAL_COMMUNITY): Payer: Self-pay | Admitting: Emergency Medicine

## 2015-04-02 ENCOUNTER — Emergency Department (HOSPITAL_COMMUNITY)
Admission: EM | Admit: 2015-04-02 | Discharge: 2015-04-02 | Disposition: A | Discharge status: Home / Self Care | Payer: Managed Care, Other (non HMO) | Attending: Emergency Medicine | Admitting: Emergency Medicine

## 2015-04-02 DIAGNOSIS — I509 Heart failure, unspecified: Secondary | ICD-10-CM | POA: Insufficient documentation

## 2015-04-02 DIAGNOSIS — Z79899 Other long term (current) drug therapy: Secondary | ICD-10-CM | POA: Insufficient documentation

## 2015-04-02 DIAGNOSIS — Z8739 Personal history of other diseases of the musculoskeletal system and connective tissue: Secondary | ICD-10-CM | POA: Diagnosis not present

## 2015-04-02 DIAGNOSIS — Y92410 Unspecified street and highway as the place of occurrence of the external cause: Secondary | ICD-10-CM | POA: Insufficient documentation

## 2015-04-02 DIAGNOSIS — Y998 Other external cause status: Secondary | ICD-10-CM | POA: Insufficient documentation

## 2015-04-02 DIAGNOSIS — E119 Type 2 diabetes mellitus without complications: Secondary | ICD-10-CM | POA: Insufficient documentation

## 2015-04-02 DIAGNOSIS — Z992 Dependence on renal dialysis: Secondary | ICD-10-CM | POA: Diagnosis not present

## 2015-04-02 DIAGNOSIS — Z8701 Personal history of pneumonia (recurrent): Secondary | ICD-10-CM | POA: Insufficient documentation

## 2015-04-02 DIAGNOSIS — R1031 Right lower quadrant pain: Secondary | ICD-10-CM

## 2015-04-02 DIAGNOSIS — Z8669 Personal history of other diseases of the nervous system and sense organs: Secondary | ICD-10-CM | POA: Insufficient documentation

## 2015-04-02 DIAGNOSIS — Z7952 Long term (current) use of systemic steroids: Secondary | ICD-10-CM | POA: Diagnosis not present

## 2015-04-02 DIAGNOSIS — R109 Unspecified abdominal pain: Secondary | ICD-10-CM | POA: Diagnosis not present

## 2015-04-02 DIAGNOSIS — Z940 Kidney transplant status: Secondary | ICD-10-CM | POA: Diagnosis not present

## 2015-04-02 DIAGNOSIS — Z7982 Long term (current) use of aspirin: Secondary | ICD-10-CM | POA: Insufficient documentation

## 2015-04-02 DIAGNOSIS — Z862 Personal history of diseases of the blood and blood-forming organs and certain disorders involving the immune mechanism: Secondary | ICD-10-CM | POA: Insufficient documentation

## 2015-04-02 DIAGNOSIS — Z794 Long term (current) use of insulin: Secondary | ICD-10-CM | POA: Diagnosis not present

## 2015-04-02 DIAGNOSIS — Y9389 Activity, other specified: Secondary | ICD-10-CM | POA: Diagnosis not present

## 2015-04-02 DIAGNOSIS — I129 Hypertensive chronic kidney disease with stage 1 through stage 4 chronic kidney disease, or unspecified chronic kidney disease: Secondary | ICD-10-CM | POA: Insufficient documentation

## 2015-04-02 DIAGNOSIS — S3991XA Unspecified injury of abdomen, initial encounter: Secondary | ICD-10-CM | POA: Insufficient documentation

## 2015-04-02 DIAGNOSIS — Z3202 Encounter for pregnancy test, result negative: Secondary | ICD-10-CM | POA: Insufficient documentation

## 2015-04-02 DIAGNOSIS — N189 Chronic kidney disease, unspecified: Secondary | ICD-10-CM | POA: Diagnosis not present

## 2015-04-02 LAB — POC URINE PREG, ED: Preg Test, Ur: NEGATIVE
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
## 2015-04-02 NOTE — Discharge Instructions (Signed)

## 2015-04-18 DIAGNOSIS — E559 Vitamin D deficiency, unspecified: Secondary | ICD-10-CM | POA: Diagnosis not present

## 2015-04-18 DIAGNOSIS — Z0100 Encounter for examination of eyes and vision without abnormal findings: Secondary | ICD-10-CM | POA: Diagnosis not present

## 2015-04-18 DIAGNOSIS — Z940 Kidney transplant status: Secondary | ICD-10-CM | POA: Diagnosis not present

## 2015-04-18 DIAGNOSIS — Z0000 Encounter for general adult medical examination without abnormal findings: Secondary | ICD-10-CM | POA: Diagnosis not present

## 2015-04-18 DIAGNOSIS — E119 Type 2 diabetes mellitus without complications: Secondary | ICD-10-CM | POA: Diagnosis not present

## 2015-04-18 DIAGNOSIS — I10 Essential (primary) hypertension: Secondary | ICD-10-CM | POA: Diagnosis not present

## 2015-04-18 DIAGNOSIS — E785 Hyperlipidemia, unspecified: Secondary | ICD-10-CM | POA: Diagnosis not present

## 2015-04-18 DIAGNOSIS — Z136 Encounter for screening for cardiovascular disorders: Secondary | ICD-10-CM | POA: Diagnosis not present

## 2015-04-18 DIAGNOSIS — Z23 Encounter for immunization: Secondary | ICD-10-CM | POA: Diagnosis not present

## 2015-04-18 DIAGNOSIS — Z01118 Encounter for examination of ears and hearing with other abnormal findings: Secondary | ICD-10-CM | POA: Diagnosis not present

## 2015-04-21 DIAGNOSIS — E559 Vitamin D deficiency, unspecified: Secondary | ICD-10-CM | POA: Diagnosis not present

## 2015-04-21 DIAGNOSIS — E119 Type 2 diabetes mellitus without complications: Secondary | ICD-10-CM | POA: Diagnosis not present

## 2015-04-21 DIAGNOSIS — E785 Hyperlipidemia, unspecified: Secondary | ICD-10-CM | POA: Diagnosis not present

## 2015-04-21 DIAGNOSIS — I10 Essential (primary) hypertension: Secondary | ICD-10-CM | POA: Diagnosis not present

## 2015-04-21 DIAGNOSIS — B029 Zoster without complications: Secondary | ICD-10-CM | POA: Diagnosis not present

## 2015-05-02 DIAGNOSIS — E119 Type 2 diabetes mellitus without complications: Secondary | ICD-10-CM | POA: Diagnosis not present

## 2015-05-02 DIAGNOSIS — E559 Vitamin D deficiency, unspecified: Secondary | ICD-10-CM | POA: Diagnosis not present

## 2015-05-02 DIAGNOSIS — E785 Hyperlipidemia, unspecified: Secondary | ICD-10-CM | POA: Diagnosis not present

## 2015-05-02 DIAGNOSIS — I10 Essential (primary) hypertension: Secondary | ICD-10-CM | POA: Diagnosis not present

## 2015-05-02 DIAGNOSIS — K219 Gastro-esophageal reflux disease without esophagitis: Secondary | ICD-10-CM | POA: Diagnosis not present

## 2015-05-04 DIAGNOSIS — Z940 Kidney transplant status: Secondary | ICD-10-CM | POA: Diagnosis not present

## 2015-05-04 DIAGNOSIS — D631 Anemia in chronic kidney disease: Secondary | ICD-10-CM | POA: Diagnosis not present

## 2015-05-04 DIAGNOSIS — I129 Hypertensive chronic kidney disease with stage 1 through stage 4 chronic kidney disease, or unspecified chronic kidney disease: Secondary | ICD-10-CM | POA: Diagnosis not present

## 2015-05-04 DIAGNOSIS — N183 Chronic kidney disease, stage 3 (moderate): Secondary | ICD-10-CM | POA: Diagnosis not present

## 2015-05-04 DIAGNOSIS — N189 Chronic kidney disease, unspecified: Secondary | ICD-10-CM | POA: Diagnosis not present

## 2015-05-04 DIAGNOSIS — N2581 Secondary hyperparathyroidism of renal origin: Secondary | ICD-10-CM | POA: Diagnosis not present

## 2015-05-16 DIAGNOSIS — D235 Other benign neoplasm of skin of trunk: Secondary | ICD-10-CM | POA: Diagnosis not present

## 2015-05-16 DIAGNOSIS — D225 Melanocytic nevi of trunk: Secondary | ICD-10-CM | POA: Diagnosis not present

## 2015-05-16 DIAGNOSIS — D485 Neoplasm of uncertain behavior of skin: Secondary | ICD-10-CM | POA: Diagnosis not present

## 2015-05-16 DIAGNOSIS — Z1283 Encounter for screening for malignant neoplasm of skin: Secondary | ICD-10-CM | POA: Diagnosis not present

## 2015-06-01 DIAGNOSIS — E113293 Type 2 diabetes mellitus with mild nonproliferative diabetic retinopathy without macular edema, bilateral: Secondary | ICD-10-CM | POA: Diagnosis not present

## 2015-07-06 DIAGNOSIS — K219 Gastro-esophageal reflux disease without esophagitis: Secondary | ICD-10-CM | POA: Diagnosis not present

## 2015-07-06 DIAGNOSIS — E119 Type 2 diabetes mellitus without complications: Secondary | ICD-10-CM | POA: Diagnosis not present

## 2015-07-06 DIAGNOSIS — E785 Hyperlipidemia, unspecified: Secondary | ICD-10-CM | POA: Diagnosis not present

## 2015-07-06 DIAGNOSIS — E559 Vitamin D deficiency, unspecified: Secondary | ICD-10-CM | POA: Diagnosis not present

## 2015-07-06 DIAGNOSIS — I10 Essential (primary) hypertension: Secondary | ICD-10-CM | POA: Diagnosis not present

## 2015-08-02 DIAGNOSIS — R0989 Other specified symptoms and signs involving the circulatory and respiratory systems: Secondary | ICD-10-CM | POA: Diagnosis not present

## 2015-08-02 DIAGNOSIS — I10 Essential (primary) hypertension: Secondary | ICD-10-CM | POA: Diagnosis not present

## 2015-08-02 DIAGNOSIS — I739 Peripheral vascular disease, unspecified: Secondary | ICD-10-CM | POA: Diagnosis not present

## 2015-08-02 DIAGNOSIS — Z940 Kidney transplant status: Secondary | ICD-10-CM | POA: Diagnosis not present

## 2015-08-04 DIAGNOSIS — Z992 Dependence on renal dialysis: Secondary | ICD-10-CM | POA: Diagnosis not present

## 2015-08-04 DIAGNOSIS — E119 Type 2 diabetes mellitus without complications: Secondary | ICD-10-CM | POA: Diagnosis not present

## 2015-08-04 DIAGNOSIS — Z940 Kidney transplant status: Secondary | ICD-10-CM | POA: Diagnosis not present

## 2015-08-04 DIAGNOSIS — Y841 Kidney dialysis as the cause of abnormal reaction of the patient, or of later complication, without mention of misadventure at the time of the procedure: Secondary | ICD-10-CM | POA: Diagnosis not present

## 2015-08-04 DIAGNOSIS — I10 Essential (primary) hypertension: Secondary | ICD-10-CM | POA: Diagnosis not present

## 2015-08-22 DIAGNOSIS — I739 Peripheral vascular disease, unspecified: Secondary | ICD-10-CM | POA: Diagnosis not present

## 2015-08-24 DIAGNOSIS — E559 Vitamin D deficiency, unspecified: Secondary | ICD-10-CM | POA: Diagnosis not present

## 2015-08-24 DIAGNOSIS — I10 Essential (primary) hypertension: Secondary | ICD-10-CM | POA: Diagnosis not present

## 2015-08-24 DIAGNOSIS — E785 Hyperlipidemia, unspecified: Secondary | ICD-10-CM | POA: Diagnosis not present

## 2015-08-24 DIAGNOSIS — E119 Type 2 diabetes mellitus without complications: Secondary | ICD-10-CM | POA: Diagnosis not present

## 2015-08-24 DIAGNOSIS — K219 Gastro-esophageal reflux disease without esophagitis: Secondary | ICD-10-CM | POA: Diagnosis not present

## 2015-08-28 ENCOUNTER — Encounter (HOSPITAL_COMMUNITY): Payer: Self-pay | Admitting: Emergency Medicine

## 2015-08-28 ENCOUNTER — Emergency Department (HOSPITAL_COMMUNITY)
Admission: EM | Admit: 2015-08-28 | Discharge: 2015-08-28 | Disposition: A | Discharge status: Home / Self Care | Payer: Medicare Other | Attending: Emergency Medicine | Admitting: Emergency Medicine

## 2015-08-28 DIAGNOSIS — E118 Type 2 diabetes mellitus with unspecified complications: Secondary | ICD-10-CM | POA: Diagnosis not present

## 2015-08-28 DIAGNOSIS — Z992 Dependence on renal dialysis: Secondary | ICD-10-CM | POA: Insufficient documentation

## 2015-08-28 DIAGNOSIS — I120 Hypertensive chronic kidney disease with stage 5 chronic kidney disease or end stage renal disease: Secondary | ICD-10-CM | POA: Insufficient documentation

## 2015-08-28 DIAGNOSIS — E119 Type 2 diabetes mellitus without complications: Secondary | ICD-10-CM | POA: Diagnosis not present

## 2015-08-28 DIAGNOSIS — N186 End stage renal disease: Secondary | ICD-10-CM | POA: Diagnosis not present

## 2015-08-28 DIAGNOSIS — I509 Heart failure, unspecified: Secondary | ICD-10-CM | POA: Insufficient documentation

## 2015-08-28 LAB — CBC WITH DIFFERENTIAL/PLATELET
Basophils Absolute: 0 10*3/uL (ref 0.0–0.1)
Basophils Relative: 2 %
Eosinophils Absolute: 0 10*3/uL (ref 0.0–0.7)
Eosinophils Relative: 2 %
HCT: 31.7 % — ABNORMAL LOW (ref 36.0–46.0)
Hemoglobin: 9.8 g/dL — ABNORMAL LOW (ref 12.0–15.0)
Lymphocytes Relative: 31 %
Lymphs Abs: 0.6 10*3/uL — ABNORMAL LOW (ref 0.7–4.0)
MCH: 24.4 pg — ABNORMAL LOW (ref 26.0–34.0)
MCHC: 30.9 g/dL (ref 30.0–36.0)
MCV: 79.1 fL (ref 78.0–100.0)
Monocytes Absolute: 0.3 10*3/uL (ref 0.1–1.0)
Monocytes Relative: 15 %
Neutro Abs: 1.1 10*3/uL — ABNORMAL LOW (ref 1.7–7.7)
Neutrophils Relative %: 50 %
Platelets: 214 10*3/uL (ref 150–400)
RBC: 4.01 MIL/uL (ref 3.87–5.11)
RDW: 14.6 % (ref 11.5–15.5)
WBC: 2.1 10*3/uL — ABNORMAL LOW (ref 4.0–10.5)

## 2015-08-28 LAB — COMPREHENSIVE METABOLIC PANEL
ALT: 9 U/L — ABNORMAL LOW (ref 14–54)
AST: 21 U/L (ref 15–41)
Albumin: 3.7 g/dL (ref 3.5–5.0)
Alkaline Phosphatase: 247 U/L — ABNORMAL HIGH (ref 38–126)
Anion gap: 13 (ref 5–15)
BUN: 15 mg/dL (ref 6–20)
CO2: 19 mmol/L — ABNORMAL LOW (ref 22–32)
Calcium: 9.4 mg/dL (ref 8.9–10.3)
Chloride: 107 mmol/L (ref 101–111)
Creatinine, Ser: 1.12 mg/dL — ABNORMAL HIGH (ref 0.44–1.00)
GFR calc Af Amer: >60 mL/min (ref >60)
GFR calc non Af Amer: 58 mL/min — ABNORMAL LOW (ref >60)
Glucose, Bld: 260 mg/dL — ABNORMAL HIGH (ref 65–99)
Potassium: 4.8 mmol/L (ref 3.5–5.1)
Sodium: 139 mmol/L (ref 135–145)
Total Bilirubin: 0.5 mg/dL (ref 0.3–1.2)
Total Protein: 6.3 g/dL — ABNORMAL LOW (ref 6.5–8.1)

## 2015-08-28 NOTE — ED Notes (Signed)
IV d/c'd from left hand from EMS, 20g, catheter intact. Patient states she has an appointment with MD tomorrow and will follow up. States she is feeling better.

## 2015-08-28 NOTE — ED Notes (Signed)
Pt arrives via EMS from home with c/o tremors. Pt reports today noted bp to be in [id number] systolic. CBG PTA 193. 20g R wrist. No neuro deficits noted. VSS.

## 2015-08-29 DIAGNOSIS — I739 Peripheral vascular disease, unspecified: Secondary | ICD-10-CM | POA: Diagnosis not present

## 2015-08-29 DIAGNOSIS — R0989 Other specified symptoms and signs involving the circulatory and respiratory systems: Secondary | ICD-10-CM | POA: Diagnosis not present

## 2015-08-29 DIAGNOSIS — I10 Essential (primary) hypertension: Secondary | ICD-10-CM | POA: Diagnosis not present

## 2015-08-29 DIAGNOSIS — Z940 Kidney transplant status: Secondary | ICD-10-CM | POA: Diagnosis not present

## 2015-09-05 DIAGNOSIS — G63 Polyneuropathy in diseases classified elsewhere: Secondary | ICD-10-CM | POA: Diagnosis not present

## 2015-09-05 DIAGNOSIS — M21619 Bunion of unspecified foot: Secondary | ICD-10-CM | POA: Diagnosis not present

## 2015-09-05 DIAGNOSIS — M79675 Pain in left toe(s): Secondary | ICD-10-CM | POA: Diagnosis not present

## 2015-09-05 DIAGNOSIS — R202 Paresthesia of skin: Secondary | ICD-10-CM | POA: Diagnosis not present

## 2015-09-05 DIAGNOSIS — M79674 Pain in right toe(s): Secondary | ICD-10-CM | POA: Diagnosis not present

## 2015-09-11 DIAGNOSIS — I10 Essential (primary) hypertension: Secondary | ICD-10-CM | POA: Diagnosis not present

## 2015-09-11 DIAGNOSIS — E1165 Type 2 diabetes mellitus with hyperglycemia: Secondary | ICD-10-CM | POA: Diagnosis not present

## 2015-09-11 DIAGNOSIS — E7800 Pure hypercholesterolemia, unspecified: Secondary | ICD-10-CM | POA: Diagnosis not present

## 2015-09-11 DIAGNOSIS — R809 Proteinuria, unspecified: Secondary | ICD-10-CM | POA: Diagnosis not present

## 2015-09-12 DIAGNOSIS — N2581 Secondary hyperparathyroidism of renal origin: Secondary | ICD-10-CM | POA: Diagnosis not present

## 2015-09-12 DIAGNOSIS — N183 Chronic kidney disease, stage 3 (moderate): Secondary | ICD-10-CM | POA: Diagnosis not present

## 2015-09-12 DIAGNOSIS — N189 Chronic kidney disease, unspecified: Secondary | ICD-10-CM | POA: Diagnosis not present

## 2015-09-12 DIAGNOSIS — Z940 Kidney transplant status: Secondary | ICD-10-CM | POA: Diagnosis not present

## 2015-09-14 DIAGNOSIS — N2581 Secondary hyperparathyroidism of renal origin: Secondary | ICD-10-CM | POA: Diagnosis not present

## 2015-09-14 DIAGNOSIS — N183 Chronic kidney disease, stage 3 (moderate): Secondary | ICD-10-CM | POA: Diagnosis not present

## 2015-09-14 DIAGNOSIS — N390 Urinary tract infection, site not specified: Secondary | ICD-10-CM | POA: Diagnosis not present

## 2015-09-14 DIAGNOSIS — Z940 Kidney transplant status: Secondary | ICD-10-CM | POA: Diagnosis not present

## 2015-09-14 DIAGNOSIS — I129 Hypertensive chronic kidney disease with stage 1 through stage 4 chronic kidney disease, or unspecified chronic kidney disease: Secondary | ICD-10-CM | POA: Diagnosis not present

## 2015-09-14 DIAGNOSIS — N189 Chronic kidney disease, unspecified: Secondary | ICD-10-CM | POA: Diagnosis not present

## 2015-09-14 DIAGNOSIS — D631 Anemia in chronic kidney disease: Secondary | ICD-10-CM | POA: Diagnosis not present

## 2015-09-17 NOTE — H&P (Addendum)
Patient seen and examined today. No new symptomatology. Admitted for fluid/hydration and planned peripheral arteriogram tomorrow. No new concerns. Denies any chest pain or shortness of breath.  OFFICE VISIT NOTES COPIED TO EPIC FOR DOCUMENTATION   Reather N. Wilmont 08/29/2015 8:52 AM Location: [facility] Patient #: [id number] DOB: [birth year] Single / Language: [name] / Race: Black or African American Female   History of Present Illness Neldon Labella AGNP-C; 08/29/2015 6:26 PM) The patient is a 45 year old female who presents for a follow-up for Peripheral vascular disease. She is s/p renal transplantation due to diabetic and hypertensive renal disease on 01/23/2013 at [facility] and is presently under the care of transplant program and followed locally by Dr. Elmarie Shiley. She recently presented here for annual f/u of HTN, orthostatic hypotension, and PAD. She reported severe pain in both her legs, frequent cramping in her legs, and feels weak in her legs at times with activity. She describes it as a burning in her legs with minimal activity, such as walking from her car into the office. No bluish discoloration or ulceration in her feet. She also reports worsening dizziness when standing. Patient has a history of orthostatic hypotension. She denies CP, SOB, orthopnea, or syncope.  She was started on Cilostazol, with minimal symptoms improvement, and scheduled for lower extremity duplex. She presents today for follow up.    Chart Review Note Laverda Page MD; 08/29/2015 8:06 PM) I have reviewed the history of present illness and findings.  Problem List/Past Medical [name]; 08/29/2015 3:13 PM) Neurogenic orthostatic hypotension (G90.3) Diabetic autonomic neuropathy associated with type 2 diabetes mellitus (E11.43) Claudication, intermittent (I73.9) Lower extremity arterial duplex 08/22/2015: Monophasic waveforms evident in the right external iliac and  common/for left femoral artery suggest severe diffuse plaque, rate mid to distal SFA appears to be nearly occluded with dampened monophasic waveform distally in the popliteal artery and below the knee. Monophasic waveforms in the AT/PT. ABI could not be calculated due to noncompressible vessels. Left external and common femoral artery appears to show greater than 50% stenosis. Severe dampened monophasic waveforms throughout the left SFA. Left AT dampened monophasic form and PTT appears occluded. ABI could not be calculated due to noncompressible vessels. Abnormal Dopplers consider further workup. Essential hypertension, benign (I10) Bilateral carotid bruits (R09.89) Carotid duplex 08/20/12: 1. No evidence of hemodynamically significant stenosis in the bilateral carotid bifurcation vessels. There is evidence of homogeneous plaque in the right carotid artery. There is evidence of homogeneous plaque in the left carotid artery. Mild right intimal thickening. Mild left intimal thickening. 2. Compared to the study 01/30/12: Left ICA stenosis not evident. Further studies if clinically indicated only. Uncontrolled type 2 diabetes mellitus with chronic kidney disease, without long-term current use of insulin, unspecified CKD stage (E11.22, E11.65) End stage renal disease (585.6) Anemia in chronic kidney disease (285.21) Hypokalemia (E87.6) Immunosuppression (D89.9) Hypomagnesemia (E83.42) Dehydration (E86.0) Acidosis, metabolic (V77.9) Leukopenia (D72.819) CMV (cytomegalovirus) (B25.9) Kidney transplant recipient (Z94.0)01/23/2013 Done at [facility] on 01/23/2013. Follows Elmarie Shiley, MD locally. [employer] study: Renal duplex 09/07/2013: Patent transplant vasculature, resistive indicis are increasing. The renal artery velocity at the anastomosis has increased from prior study, However this may indicate elevated velocity in the proximal iliac artery rather than presence of  true stenosis.  Allergies ([name]; 08/29/2015 3:13 PM) No Known Drug Allergies01/31/2014  Family History [name]; 08/29/2015 3:13 PM) Mother is 73 yrs. old-Hypertension. Father is 13 yrs. old-h/o MI in his early 42's; Hypertension.  Brother died at age 73 from CHF with complications from diabetes. Mother living; h/o HTN Father living; h/o HTN, had triple by-pass  Social History [name]; 08/29/2015 3:13 PM) No smoking. No alcohol. Marital status Single. Living Situation Lives with parents. Number of Children 0.  Past Surgical History [name]; 08/29/2015 3:13 PM) 09/29/11 right arm AV fistula Dr. Scot Dock.  Failed left April 19th 2013 Transplantation; Renal08/03/2013 Right.  Medication History ([name]; 08/29/2015 3:19 PM) Cilostazol (100MG Tablet, 1 (one) Tablet Oral two times daily, Taken starting 08/02/2015) Active. Protonix (40MG Tablet DR, 1 Oral at bedtime) Active. Pravastatin Sodium (20MG Tablet, 1 Oral at bedtime) Active. AmLODIPine Besylate (10MG Tablet, 1 Oral at bedtime) Active. Labetalol HCl (300MG Tablet, 1 Oral daily) Active. PredniSONE (Pak) (5MG Tablet, 1 Oral daily) Active. Myfortic (180MG Tablet DR, 3 Oral two times daily) Active. Bactrim (400-80MG Tablet, 1 Oral M,W,Fri) Active. NovoLOG FlexPen (100UNIT/ML Soln Pen-inj, sliding scale Subcutaneous) Active. Levemir FlexTouch (100UNIT/ML Soln Pen-inj, 14 U in the AM, 22 U in the PM Subcutaneous) Active. Prograf (1MG Capsule, 2 in the AM Oral 2 in the PM) Active. Aspirin EC (325MG Tablet DR, 1 Oral daily) Active. Albuterol Sulfate HFA (108 (90 Base)MCG/ACT Aerosol Soln, 1 to 2 puffs Inhalation as needed) Active. Furosemide (20MG Tablet, 1 Oral as needed) Active. Medications Reconciled (verbally with pt/ list present)  Diagnostic Studies History ([name]; 08/29/2015 3:18 PM) Lower Extremity Dopplers03/12/2015 Monophasic waveforms  evident in the right external iliac and common/for left femoral artery suggest severe diffuse plaque, rate mid to distal SFA appears to be nearly occluded with dampened monophasic waveform distally in the popliteal artery and below the knee. Monophasic waveforms in the AT/PT. ABI could not be calculated due to noncompressible vessels. Left external and common femoral artery appears to show greater than 50% stenosis. Severe dampened monophasic waveforms throughout the left SFA. Left AT dampened monophasic form and PTT appears occluded. ABI could not be calculated due to noncompressible vessels. Abnormal Dopplers consider further workup. Renal duplex 07/29/11: Normal renal duplex scan. ECG 06/28/11: NSR @ 76/min. Normal intervals. non specific ST inf leads. no ischemia.  Labwork 05/04/2015: Serum glucose 198, creatinine 0.95, potassium 4.7, WBC 1.6, HB 11.0 with microcytic indices, vitamin D 16 08/12/2014: Serum glucose 180, BUN 23, creatinine 0.88, eGFR 92, potassium 4.6, CMP otherwise normal, CBC normal HbA1c 12/20/2013: 8.4% 11/02/2013: Potassium 4.6, BUN 20, serum creatinine 0.85. 10/05/2013: HB 11.1/HCT 37.1. CMP revealed BUN of 25, serum creatinine 0.88. Electrolytes within normal limits. Carotid duplex 01/29/12: Left ICA 50-69% stenosis. Right no stenosis (was noted prev). Recheck 6 mont Echocardiogram done on 03/30/2011 reveals normal LV systolic function. Mild diastolic dysfunction. Trivial pericardial effusion.LVH.  Endoscopy05/2015 Normal. at [place]  Other Problems ([name]; 08/29/2015 3:13 PM) Dizziness and giddiness (R42) Now on Hemodialysis since 09/28/10 Hospitalized at [facility] from 10/12-10/18/12 for CHF, cardiomegaly, hypertension, diabetes, chronic kidney failure.   History I have reviewed the history and findings.    Review of Systems [facility] Ebony Hail, [place]; 08/29/2015 7:57 PM) General Not Present- Anorexia, Fatigue and Fever. Respiratory Not Present- Cough,  Decreased Exercise Tolerance and Dyspnea. Cardiovascular Present- Claudications and Leg Cramps. Not Present- Chest Pain, Edema, Orthopnea, Paroxysmal Nocturnal Dyspnea and Shortness of Breath. Gastrointestinal Not Present- Change in Bowel Habits, Constipation and Nausea. Neurological Present- Dizziness. Not Present- Syncope. Endocrine Not Present- Appetite Changes, Cold Intolerance and Heat Intolerance. Hematology Not Present- Anemia, Petechiae and Prolonged Bleeding. All other systems negative   Chart Review Note Einar Gip, Turner Daniels  MD; 08/29/2015 8:06 PM) I have reviewed the review of systems and findings.  Vitals [name]; 08/29/2015 3:20 PM) 08/29/2015 3:15 PM Weight: 144 lb Height: 62in Body Surface Area: 1.66 m Body Mass Index: 26.34 kg/m  Pulse: 102 (Regular)  P.OX: 98% (Room air) BP: 148/70 (Sitting, Left Arm, Standard)       Physical Exam (Bridgette Ebony Hail, AGNP-C; 08/29/2015 7:57 PM) General Mental Status-Alert. General Appearance-Cooperative, Appears stated age, Not in acute distress. Orientation-Oriented X3. Build & Nutrition-Short statured and [place].  Head and Neck Thyroid Gland Characteristics - no palpable nodules, no palpable enlargement.  Chest and Lung Exam Palpation Tender - No chest wall tenderness. Auscultation Breath sounds - Clear.  Cardiovascular Inspection Jugular vein - Right - No Distention. Auscultation Heart Sounds - S1 WNL, S2 WNL and No gallop present. Murmurs & Other Heart Sounds: Murmur - Location - Aortic Area. Timing - Mid-systolic. Grade - II/VI. Note: I-II/VI.  Abdomen Palpation/Percussion Normal exam - Non Tender and No hepatosplenomegaly. Auscultation Normal exam - Bowel sounds normal. Abdominal bruit - Right Lower Quadrant(over transplant site).  Peripheral Vascular Lower Extremity Inspection - Left - No Pigmentation, No Varicose veins. Right - No Pigmentation, No Varicose veins. Palpation -  Edema - Left - No edema. Right - No edema. Femoral pulse - Left - 2+ and Bruit. Right - Feeble and Bruit. Popliteal pulse - Bilateral - Feeble. Dorsalis pedis pulse - Bilateral - Absent. Posterior tibial pulse - Bilateral - Absent. Auscultation - Right Groin Bruit Present. Carotid arteries - Bilateral-Carotid bruit. Abdomen-No prominent abdominal aortic pulsation, No epigastric bruit.  Neurologic Motor-Grossly intact without any focal deficits.  Musculoskeletal Global Assessment Left Lower Extremity - normal range of motion without pain. Right Lower Extremity - normal range of motion without pain.   Chart Review Note Laverda Page MD; 08/29/2015 8:06 PM) I have reviewed the physical exam and findings.    Assessment & Plan (Bridgette Ebony Hail AGNP-C; 08/29/2015 7:57 PM)  Claudication, intermittent (I73.9) Story: Lower extremity arterial duplex 08/22/2015: Monophasic waveforms evident in the right external iliac and common/for left femoral artery suggest severe diffuse plaque, rate mid to distal SFA appears to be nearly occluded with dampened monophasic waveform distally in the popliteal artery and below the knee. Monophasic waveforms in the AT/PT. ABI could not be calculated due to noncompressible vessels. Left external and common femoral artery appears to show greater than 50% stenosis. Severe dampened monophasic waveforms throughout the left SFA. Left AT dampened monophasic form and PTT appears occluded. ABI could not be calculated due to noncompressible vessels. Abnormal Dopplers consider further workup.   Current Plans METABOLIC PANEL, BASIC (29021) CBC & PLATELETS (AUTO) (11552) PT (PROTHROMBIN TIME) (08022)  Kidney transplant recipient (Z94.0) Story: Done at [facility] on 01/23/2013. Follows Elmarie Shiley, MD locally.  [employer] study: Renal duplex 09/07/2013: Patent transplant vasculature, resistive indicis are increasing. The renal artery  velocity at the anastomosis has increased from prior study, However this may indicate elevated velocity in the proximal iliac artery rather than presence of true stenosis.  Essential hypertension, benign (I10) Impression: EKG 08/02/2015: Sinus rhythm at a rate of 86 bpm, normal axis, normal intervals, nonspecific ST and T wave abnormality. No significant change from EKG on 07/29/2014.  Bilateral carotid bruits (R09.89) Story: Carotid duplex 08/20/12: 1. No evidence of hemodynamically significant stenosis in the bilateral carotid bifurcation vessels. There is evidence of homogeneous plaque in the right carotid artery. There is evidence of homogeneous plaque in the left carotid artery.  Mild right intimal thickening. Mild left intimal thickening. 2. Compared to the study 01/30/12: Left ICA stenosis not evident. Further studies if clinically indicated only.  Uncontrolled type 2 diabetes mellitus with chronic kidney disease, without long-term current use of insulin, unspecified CKD stage (E11.22)   Current Plans Mechanism of underlying disease process and action of medications discussed with the patient. I discussed primary/secondary prevention and also dietary counseling was done. She returns for follow up of lower extremity duplex. Symptoms of severe, activity limiting claudication persist despite medical therapy. Lower extremity duplex reveals evidence of stenosis of the mid to distal SFA, left external and common femoral artery, and left PTT appears occluded. Given symptoms and abnormal duplex, patient needs PV angiogram for further evaluation and treatment. Patient understands the risks, benefits, alternatives including medical therapy, CT angiography. Patient understands <1-2% risk of death, embolic complications, bleeding, infection, renal failure, urgent surgical revascularization, but not limited to these and wants to proceed. Will admit one day prior for IV hydration to prevent contrast induced  nephropathy. Follow up after procedure.  Chart Review Note Laverda Page MD; 08/29/2015 8:08 PM) I have reviewed the assessment & plan and findings and added the following comments: Patient states she is not able to do anything without her calf hurting badly. She is aware of contrast nephropathy and wants something done for her legs. Will admit prior day and hydrate. Will d/w Dr. Elmarie Shiley, MD (Nephrology). Needs to schedule for peripheral arteriogram and possible angioplasty given symptoms. Patient understands the risks, benefits, alternatives including medical therapy, CT angiography. Patient understands <1-2% risk of death, embolic complications, bleeding, infection, renal failure, urgent surgical revascularization, but not limited to these and wants to proceed. Staged procedure discussed also.  Completed and submitted for review by Neldon Labella AGNP-C (08/29/2015 7:58 PM)  Addendum: Labs 09/14/2015: BUN 15, serum creatinine 1.14, eGFR 67 mL.  Serum glucose 171 mg, potassium 4.7.  CMP otherwise normal.  Pro time normal at 10.1 seconds.  Hemoglobin 9.9/hematocrit 29, microcytic indicis.    Dr. Elmarie Shiley agrees only proceeding with her from angiogram and hydration admission previous day.   Signed by Laverda Page, MD (08/29/2015 8:09 PM)  Addendum: 09/18/2015: No change in physical examination. All medications were reviewed. Will avoid diuresis and ARB/ACE inhibitors for now until angiograms performed. All questions answered.  Scheduled Meds: . amLODipine  10 mg Oral QHS  . cilostazol  100 mg Oral BID  . insulin aspart  0-20 Units Subcutaneous TID WC  . insulin aspart  3 Units Subcutaneous TID WC  . [START ON 09/19/2015] insulin aspart  4-14 Units Subcutaneous TID AC  . insulin detemir  16-22 Units Subcutaneous See admin instructions  . labetalol  300 mg Oral Daily  . mycophenolate  360-540 mg Oral BID  . pantoprazole  40 mg Oral Daily  . pravastatin  20 mg Oral QHS  . sodium  chloride flush  3 mL Intravenous Q12H  . sodium chloride flush  3 mL Intravenous Q12H  . sulfamethoxazole-trimethoprim  1 tablet Oral Q M,W,F  . tacrolimus  1.5 mg Oral BID  . Vitamin D (Ergocalciferol)  50,000 Units Oral Q7 days   Continuous Infusions: . sodium chloride 150 mL/hr at 09/18/15 1606   PRN Meds:.sodium chloride, albuterol, sodium chloride flush

## 2015-09-18 ENCOUNTER — Encounter (HOSPITAL_COMMUNITY): Payer: Self-pay

## 2015-09-18 ENCOUNTER — Observation Stay (HOSPITAL_COMMUNITY)
Admission: RE | Admit: 2015-09-18 | Discharge: 2015-09-20 | Disposition: A | Discharge status: Home / Self Care | Payer: Medicare Other | Source: Ambulatory Visit | Attending: Cardiology | Admitting: Cardiology

## 2015-09-18 DIAGNOSIS — I120 Hypertensive chronic kidney disease with stage 5 chronic kidney disease or end stage renal disease: Secondary | ICD-10-CM | POA: Insufficient documentation

## 2015-09-18 DIAGNOSIS — I739 Peripheral vascular disease, unspecified: Secondary | ICD-10-CM | POA: Diagnosis not present

## 2015-09-18 DIAGNOSIS — N186 End stage renal disease: Secondary | ICD-10-CM | POA: Diagnosis not present

## 2015-09-18 DIAGNOSIS — E1122 Type 2 diabetes mellitus with diabetic chronic kidney disease: Secondary | ICD-10-CM | POA: Insufficient documentation

## 2015-09-18 DIAGNOSIS — E1165 Type 2 diabetes mellitus with hyperglycemia: Secondary | ICD-10-CM | POA: Insufficient documentation

## 2015-09-18 DIAGNOSIS — Z992 Dependence on renal dialysis: Secondary | ICD-10-CM | POA: Insufficient documentation

## 2015-09-18 DIAGNOSIS — I70211 Atherosclerosis of native arteries of extremities with intermittent claudication, right leg: Secondary | ICD-10-CM | POA: Diagnosis not present

## 2015-09-18 DIAGNOSIS — I70201 Unspecified atherosclerosis of native arteries of extremities, right leg: Secondary | ICD-10-CM | POA: Diagnosis present

## 2015-09-18 DIAGNOSIS — E1143 Type 2 diabetes mellitus with diabetic autonomic (poly)neuropathy: Secondary | ICD-10-CM | POA: Insufficient documentation

## 2015-09-18 DIAGNOSIS — Z940 Kidney transplant status: Secondary | ICD-10-CM | POA: Diagnosis not present

## 2015-09-18 LAB — BASIC METABOLIC PANEL
Anion gap: 12 (ref 5–15)
BUN: 12 mg/dL (ref 6–20)
CO2: 20 mmol/L — ABNORMAL LOW (ref 22–32)
Calcium: 9.4 mg/dL (ref 8.9–10.3)
Chloride: 107 mmol/L (ref 101–111)
Creatinine, Ser: 1.19 mg/dL — ABNORMAL HIGH (ref 0.44–1.00)
GFR calc Af Amer: >60 mL/min (ref >60)
GFR calc non Af Amer: 54 mL/min — ABNORMAL LOW (ref >60)
Glucose, Bld: 216 mg/dL — ABNORMAL HIGH (ref 65–99)
Potassium: 4.1 mmol/L (ref 3.5–5.1)
Sodium: 139 mmol/L (ref 135–145)

## 2015-09-18 LAB — GLUCOSE, CAPILLARY
Glucose-Capillary: 164 mg/dL — ABNORMAL HIGH (ref 65–99)
Glucose-Capillary: 124 mg/dL — ABNORMAL HIGH (ref 65–99)

## 2015-09-18 LAB — HCG, SERUM, QUALITATIVE: Preg, Serum: NEGATIVE

## 2015-09-18 MED ORDER — SODIUM CHLORIDE 0.9% FLUSH
3.0000 mL | Freq: Two times a day (BID) | INTRAVENOUS | Status: DC
  Administered 2015-09-18 – 2015-09-19 (×2): 3 mL via INTRAVENOUS

## 2015-09-18 MED ORDER — CILOSTAZOL 100 MG PO TABS
100.0000 mg | ORAL_TABLET | Freq: Two times a day (BID) | ORAL | Status: DC
  Administered 2015-09-18 – 2015-09-19 (×2): 100 mg via ORAL
  Filled 2015-09-18 (×2): qty 1

## 2015-09-18 MED ORDER — PRAVASTATIN SODIUM 20 MG PO TABS
20.0000 mg | ORAL_TABLET | Freq: Every day | ORAL | Status: DC
  Administered 2015-09-18 – 2015-09-19 (×2): 20 mg via ORAL
  Filled 2015-09-18 (×2): qty 1

## 2015-09-18 MED ORDER — INSULIN DETEMIR 100 UNIT/ML ~~LOC~~ SOLN
22.0000 [IU] | Freq: Every day | SUBCUTANEOUS | Status: DC
  Administered 2015-09-18: 22 [IU] via SUBCUTANEOUS
  Filled 2015-09-18 (×2): qty 0.22

## 2015-09-18 MED ORDER — INSULIN DETEMIR 100 UNIT/ML ~~LOC~~ SOLN
16.0000 [IU] | Freq: Every day | SUBCUTANEOUS | Status: DC
Start: 2015-09-19 — End: 2015-09-19
  Filled 2015-09-18: qty 0.16

## 2015-09-18 MED ORDER — SODIUM CHLORIDE 0.9 % IV SOLN
INTRAVENOUS | Status: DC
  Administered 2015-09-18 – 2015-09-19 (×2): via INTRAVENOUS

## 2015-09-18 MED ORDER — SODIUM CHLORIDE 0.9% FLUSH: 3.0000 mL | Freq: Two times a day (BID) | INTRAVENOUS | Status: DC

## 2015-09-18 MED ORDER — SULFAMETHOXAZOLE-TRIMETHOPRIM 400-80 MG PO TABS
1.0000 | ORAL_TABLET | ORAL | Status: DC
  Filled 2015-09-18 (×2): qty 1

## 2015-09-18 MED ORDER — MYCOPHENOLATE SODIUM 180 MG PO TBEC
360.0000 mg | DELAYED_RELEASE_TABLET | Freq: Every evening | ORAL | Status: DC
  Administered 2015-09-18 – 2015-09-19 (×2): 360 mg via ORAL
  Filled 2015-09-18 (×2): qty 2

## 2015-09-18 MED ORDER — TACROLIMUS 1 MG PO CAPS
1.5000 mg | ORAL_CAPSULE | Freq: Two times a day (BID) | ORAL | Status: DC
  Administered 2015-09-18 – 2015-09-19 (×2): 1.5 mg via ORAL
  Filled 2015-09-18 (×6): qty 1

## 2015-09-18 MED ORDER — PANTOPRAZOLE SODIUM 40 MG PO TBEC
40.0000 mg | DELAYED_RELEASE_TABLET | Freq: Every day | ORAL | Status: DC
  Administered 2015-09-18 – 2015-09-19 (×2): 40 mg via ORAL
  Filled 2015-09-18 (×2): qty 1

## 2015-09-18 MED ORDER — LABETALOL HCL 200 MG PO TABS
300.0000 mg | ORAL_TABLET | Freq: Every day | ORAL | Status: DC
  Administered 2015-09-18 – 2015-09-19 (×2): 300 mg via ORAL
  Filled 2015-09-18 (×2): qty 1

## 2015-09-18 MED ORDER — ALBUTEROL SULFATE (2.5 MG/3ML) 0.083% IN NEBU: 2.5000 mg | INHALATION_SOLUTION | Freq: Four times a day (QID) | RESPIRATORY_TRACT | Status: DC | PRN

## 2015-09-18 MED ORDER — MYCOPHENOLATE SODIUM 180 MG PO TBEC
540.0000 mg | DELAYED_RELEASE_TABLET | Freq: Every day | ORAL | Status: DC
  Administered 2015-09-20: 540 mg via ORAL
  Filled 2015-09-18 (×3): qty 3

## 2015-09-18 MED ORDER — AMLODIPINE BESYLATE 10 MG PO TABS
10.0000 mg | ORAL_TABLET | Freq: Every day | ORAL | Status: DC
Start: 2015-09-18 — End: 2015-09-20
  Administered 2015-09-18 – 2015-09-19 (×2): 10 mg via ORAL
  Filled 2015-09-18 (×2): qty 1

## 2015-09-18 MED ORDER — SODIUM CHLORIDE 0.9 % IV SOLN: 250.0000 mL | INTRAVENOUS | Status: DC | PRN

## 2015-09-18 MED ORDER — SODIUM CHLORIDE 0.9% FLUSH: 3.0000 mL | INTRAVENOUS | Status: DC | PRN

## 2015-09-18 MED ORDER — INSULIN ASPART 100 UNIT/ML ~~LOC~~ SOLN: 4.0000 [IU] | Freq: Three times a day (TID) | SUBCUTANEOUS | Status: DC

## 2015-09-18 MED ORDER — INSULIN ASPART 100 UNIT/ML ~~LOC~~ SOLN: 3.0000 [IU] | Freq: Three times a day (TID) | SUBCUTANEOUS | Status: DC

## 2015-09-18 MED ORDER — VITAMIN D (ERGOCALCIFEROL) 1.25 MG (50000 UNIT) PO CAPS: 50000.0000 [IU] | ORAL_CAPSULE | ORAL | Status: DC

## 2015-09-18 MED ORDER — INSULIN ASPART 100 UNIT/ML ~~LOC~~ SOLN
0.0000 [IU] | Freq: Three times a day (TID) | SUBCUTANEOUS | Status: DC
  Administered 2015-09-18: 4 [IU] via SUBCUTANEOUS

## 2015-09-18 NOTE — Plan of Care (Signed)
Problem: Pain Managment: Goal: General experience of comfort will improve Outcome: Completed/Met Date Met:  09/18/15 Pt educated on safety measures put into place. Pt verbalized understanding.

## 2015-09-19 ENCOUNTER — Encounter (HOSPITAL_COMMUNITY)
Admission: RE | Disposition: A | Discharge status: Home / Self Care | Payer: Self-pay | Source: Ambulatory Visit | Attending: Cardiology

## 2015-09-19 ENCOUNTER — Encounter (HOSPITAL_COMMUNITY): Payer: Self-pay | Admitting: Cardiology

## 2015-09-19 DIAGNOSIS — Z992 Dependence on renal dialysis: Secondary | ICD-10-CM | POA: Diagnosis not present

## 2015-09-19 DIAGNOSIS — E1165 Type 2 diabetes mellitus with hyperglycemia: Secondary | ICD-10-CM | POA: Diagnosis not present

## 2015-09-19 DIAGNOSIS — Z940 Kidney transplant status: Secondary | ICD-10-CM | POA: Diagnosis not present

## 2015-09-19 DIAGNOSIS — E1143 Type 2 diabetes mellitus with diabetic autonomic (poly)neuropathy: Secondary | ICD-10-CM | POA: Diagnosis not present

## 2015-09-19 DIAGNOSIS — I120 Hypertensive chronic kidney disease with stage 5 chronic kidney disease or end stage renal disease: Secondary | ICD-10-CM | POA: Diagnosis not present

## 2015-09-19 DIAGNOSIS — E1122 Type 2 diabetes mellitus with diabetic chronic kidney disease: Secondary | ICD-10-CM | POA: Diagnosis not present

## 2015-09-19 DIAGNOSIS — N186 End stage renal disease: Secondary | ICD-10-CM | POA: Diagnosis not present

## 2015-09-19 DIAGNOSIS — I70211 Atherosclerosis of native arteries of extremities with intermittent claudication, right leg: Secondary | ICD-10-CM | POA: Diagnosis not present

## 2015-09-19 DIAGNOSIS — I739 Peripheral vascular disease, unspecified: Secondary | ICD-10-CM | POA: Diagnosis not present

## 2015-09-19 HISTORY — PX: PERIPHERAL VASCULAR CATHETERIZATION: SHX172C

## 2015-09-19 LAB — CBC
HCT: 31.5 % — ABNORMAL LOW (ref 36.0–46.0)
Hemoglobin: 9.9 g/dL — ABNORMAL LOW (ref 12.0–15.0)
MCH: 24.6 pg — ABNORMAL LOW (ref 26.0–34.0)
MCHC: 31.4 g/dL (ref 30.0–36.0)
MCV: 78.4 fL (ref 78.0–100.0)
Platelets: 165 10*3/uL (ref 150–400)
RBC: 4.02 MIL/uL (ref 3.87–5.11)
RDW: 14.4 % (ref 11.5–15.5)
WBC: 2.3 10*3/uL — ABNORMAL LOW (ref 4.0–10.5)

## 2015-09-19 LAB — GLUCOSE, CAPILLARY
Glucose-Capillary: 49 mg/dL — ABNORMAL LOW (ref 65–99)
Glucose-Capillary: 72 mg/dL (ref 65–99)
Glucose-Capillary: 41 mg/dL — CL (ref 65–99)
Glucose-Capillary: 96 mg/dL (ref 65–99)
Glucose-Capillary: 113 mg/dL — ABNORMAL HIGH (ref 65–99)
Glucose-Capillary: 106 mg/dL — ABNORMAL HIGH (ref 65–99)
Glucose-Capillary: 112 mg/dL — ABNORMAL HIGH (ref 65–99)
Glucose-Capillary: 104 mg/dL — ABNORMAL HIGH (ref 65–99)
Glucose-Capillary: 171 mg/dL — ABNORMAL HIGH (ref 65–99)

## 2015-09-19 LAB — POCT ACTIVATED CLOTTING TIME
Activated Clotting Time: 214 seconds
Activated Clotting Time: 239 seconds
Activated Clotting Time: 193 seconds
Activated Clotting Time: 173 seconds

## 2015-09-19 LAB — PROTIME-INR
INR: 1.08 (ref 0.00–1.49)
Prothrombin Time: 14.2 seconds (ref 11.6–15.2)

## 2015-09-19 SURGERY — LOWER EXTREMITY ANGIOGRAPHY
Laterality: Right

## 2015-09-19 MED ORDER — HEPARIN (PORCINE) IN NACL 2-0.9 UNIT/ML-% IJ SOLN
INTRAMUSCULAR | Status: AC
  Filled 2015-09-19: qty 500

## 2015-09-19 MED ORDER — INSULIN DETEMIR 100 UNIT/ML ~~LOC~~ SOLN
12.0000 [IU] | Freq: Every day | SUBCUTANEOUS | Status: DC
Start: 2015-09-20 — End: 2015-09-20
  Filled 2015-09-19: qty 0.12

## 2015-09-19 MED ORDER — HEPARIN SODIUM (PORCINE) 1000 UNIT/ML IJ SOLN
INTRAMUSCULAR | Status: AC
  Filled 2015-09-19: qty 1

## 2015-09-19 MED ORDER — MIDAZOLAM HCL 2 MG/2ML IJ SOLN
INTRAMUSCULAR | Status: AC
  Filled 2015-09-19: qty 2

## 2015-09-19 MED ORDER — HEPARIN (PORCINE) IN NACL 2-0.9 UNIT/ML-% IJ SOLN
INTRAMUSCULAR | Status: DC | PRN
  Administered 2015-09-19: 2000 mL

## 2015-09-19 MED ORDER — ONDANSETRON HCL 4 MG/2ML IJ SOLN: 4.0000 mg | Freq: Four times a day (QID) | INTRAMUSCULAR | Status: DC | PRN

## 2015-09-19 MED ORDER — SODIUM CHLORIDE 0.9% FLUSH: 3.0000 mL | Freq: Two times a day (BID) | INTRAVENOUS | Status: DC

## 2015-09-19 MED ORDER — DEXTROSE 50 % IV SOLN
INTRAVENOUS | Status: AC
  Administered 2015-09-19: 25 mL
  Filled 2015-09-19: qty 50

## 2015-09-19 MED ORDER — ASPIRIN EC 325 MG PO TBEC: 325.0000 mg | DELAYED_RELEASE_TABLET | Freq: Every day | ORAL | Status: DC

## 2015-09-19 MED ORDER — ACETAMINOPHEN 325 MG PO TABS
650.0000 mg | ORAL_TABLET | ORAL | Status: DC | PRN
  Administered 2015-09-19: 650 mg via ORAL
  Filled 2015-09-19: qty 2

## 2015-09-19 MED ORDER — SODIUM CHLORIDE 0.9 % IV SOLN
1.0000 mL/kg/h | INTRAVENOUS | Status: AC
  Administered 2015-09-19: 1 mL/kg/h via INTRAVENOUS

## 2015-09-19 MED ORDER — LIDOCAINE HCL (PF) 1 % IJ SOLN
INTRAMUSCULAR | Status: DC | PRN
Start: 2015-09-19 — End: 2015-09-19
  Administered 2015-09-19: 25 mL

## 2015-09-19 MED ORDER — DEXTROSE-NACL 5-0.45 % IV SOLN: INTRAVENOUS | Status: DC

## 2015-09-19 MED ORDER — MIDAZOLAM HCL 2 MG/2ML IJ SOLN
INTRAMUSCULAR | Status: DC | PRN
  Administered 2015-09-19: 1 mg via INTRAVENOUS

## 2015-09-19 MED ORDER — SODIUM CHLORIDE 0.9% FLUSH: 3.0000 mL | INTRAVENOUS | Status: DC | PRN

## 2015-09-19 MED ORDER — DEXTROSE 50 % IV SOLN
INTRAVENOUS | Status: AC
  Filled 2015-09-19: qty 50

## 2015-09-19 MED ORDER — HEPARIN SODIUM (PORCINE) 1000 UNIT/ML IJ SOLN
INTRAMUSCULAR | Status: DC | PRN
Start: 2015-09-19 — End: 2015-09-19
  Administered 2015-09-19: 5000 [IU] via INTRAVENOUS

## 2015-09-19 MED ORDER — FENTANYL CITRATE (PF) 100 MCG/2ML IJ SOLN
INTRAMUSCULAR | Status: DC | PRN
  Administered 2015-09-19: 25 ug via INTRAVENOUS

## 2015-09-19 MED ORDER — INSULIN DETEMIR 100 UNIT/ML ~~LOC~~ SOLN
18.0000 [IU] | Freq: Every day | SUBCUTANEOUS | Status: DC
  Administered 2015-09-19: 18 [IU] via SUBCUTANEOUS
  Filled 2015-09-19 (×2): qty 0.18

## 2015-09-19 MED ORDER — SODIUM CHLORIDE 0.9 % IV SOLN: 250.0000 mL | INTRAVENOUS | Status: DC | PRN

## 2015-09-19 MED ORDER — FENTANYL CITRATE (PF) 100 MCG/2ML IJ SOLN
INTRAMUSCULAR | Status: AC
  Filled 2015-09-19: qty 2

## 2015-09-19 MED ORDER — LIDOCAINE HCL (PF) 1 % IJ SOLN
INTRAMUSCULAR | Status: AC
  Filled 2015-09-19: qty 30

## 2015-09-19 MED ORDER — NITROGLYCERIN 1 MG/10 ML FOR IR/CATH LAB
INTRA_ARTERIAL | Status: AC
  Filled 2015-09-19: qty 10

## 2015-09-19 MED ORDER — NITROGLYCERIN 1 MG/10 ML FOR IR/CATH LAB
INTRA_ARTERIAL | Status: DC | PRN
  Administered 2015-09-19 (×2): 500 ug via INTRA_ARTERIAL

## 2015-09-19 SURGICAL SUPPLY — 16 items
BALLN ARMADA 6X20X80 (BALLOONS) ×3
BALLOON ARMADA 6X20X80 (BALLOONS) IMPLANT
CATH OMNI FLUSH 5F 65CM (CATHETERS) ×1 IMPLANT
CATH STRAIGHT 5FR 65CM (CATHETERS) ×1 IMPLANT
KIT ENCORE 26 ADVANTAGE (KITS) ×1 IMPLANT
KIT MICROINTRODUCER STIFF 5F (SHEATH) ×1 IMPLANT
KIT PV (KITS) ×3 IMPLANT
SHEATH PINNACLE 5F 10CM (SHEATH) ×1 IMPLANT
SHEATH PINNACLE ST 7F 45CM (SHEATH) ×1 IMPLANT
STENT OMNILINK ELITE 6X12X80 (Permanent Stent) ×1 IMPLANT
STOPCOCK MORSE 400PSI 3WAY (MISCELLANEOUS) ×1 IMPLANT
SYRINGE MEDRAD AVANTA MACH 7 (SYRINGE) ×1 IMPLANT
TRANSDUCER W/STOPCOCK (MISCELLANEOUS) ×3 IMPLANT
TRAY PV CATH (CUSTOM PROCEDURE TRAY) ×3 IMPLANT
WIRE HITORQ VERSACORE ST 145CM (WIRE) ×1 IMPLANT
WIRE VERSACORE LOC 115CM (WIRE) ×1 IMPLANT

## 2015-09-19 NOTE — Progress Notes (Signed)
84fr long sheath removed at 1305 and hemostasis achieved after 20 minutes of direct pressure.  Clear DSG applied to site. Groin area level 0 with palpable distal pulses.  Instruction given R/T bedrest and restrictions and precautions and acknowledged by voice back.  Bedrest anticipated to be over at 1925 this pm

## 2015-09-19 NOTE — Progress Notes (Signed)
Cbg repeated =72. Pt awake and alert feeling much better.

## 2015-09-19 NOTE — Progress Notes (Signed)
Went in to obtain another B/P and found pt to be lethargic, diaphoretic and cool to the touch. Stat cbg obtained =49. D 50 25 cc's administered IV per protocol. B/P 124/58.

## 2015-09-19 NOTE — Interval H&P Note (Signed)
History and Physical Interval Note:  09/19/2015 9:45 AM  [name]  has presented today for surgery, with the diagnosis of claudication  The various methods of treatment have been discussed with the patient and family. After consideration of risks, benefits and other options for treatment, the patient has consented to  Procedure(s): Lower Extremity Angiography (N/A) and possible PTA  as a surgical intervention .  The patient's history has been reviewed, patient examined, no change in status, stable for surgery.  I have reviewed the patient's chart and labs.  Questions were answered to the patient's satisfaction.   Episode of hypoglycemia noted. IVF changed to D5 1/2 saline  [name]

## 2015-09-19 NOTE — Progress Notes (Signed)
Inpatient Diabetes Program Recommendations  AACE/ADA: New Consensus Statement on Inpatient Glycemic Control (2015)  Target Ranges:  Prepandial:   less than 140 mg/dL      Peak postprandial:   less than 180 mg/dL (1-2 hours)      Critically ill patients:  140 - 180 mg/dL   Results for [name], [name] (MRN [id number]) as of 09/19/2015 14:30  Ref. Range 09/19/2015 07:00 09/19/2015 07:32 09/19/2015 08:39 09/19/2015 09:07 09/19/2015 10:30 09/19/2015 11:47 09/19/2015 13:50  Glucose-Capillary Latest Ref Range: 65-99 mg/dL 49 (L) 72 41 (LL) 96 113 (H) 106 (H) 112 (H)   Review of Glycemic Control  Diabetes history: DM 2 Outpatient Diabetes medications: Levemir 16 units QAM, 22 units QPM, Novolog 4-14 units TID Current orders for Inpatient glycemic control: Levemir 12 units QAM, 18 units QPM, Novolog Resistant + Novolog 3 units TID [facility]  Inpatient Diabetes Program Recommendations:   Please decrease correction to Novolog Moderate Correction scale and discontinue the 3 units of meal coverage since patient experienced hypoglycemia this am.  Thanks,  Tama Headings RN, MSN, [facility] Inpatient Diabetes Coordinator Team Pager [phone] (8a-5p)

## 2015-09-20 DIAGNOSIS — I70211 Atherosclerosis of native arteries of extremities with intermittent claudication, right leg: Secondary | ICD-10-CM | POA: Diagnosis not present

## 2015-09-20 DIAGNOSIS — E1143 Type 2 diabetes mellitus with diabetic autonomic (poly)neuropathy: Secondary | ICD-10-CM | POA: Diagnosis not present

## 2015-09-20 DIAGNOSIS — N186 End stage renal disease: Secondary | ICD-10-CM | POA: Diagnosis not present

## 2015-09-20 DIAGNOSIS — E1122 Type 2 diabetes mellitus with diabetic chronic kidney disease: Secondary | ICD-10-CM | POA: Diagnosis not present

## 2015-09-20 DIAGNOSIS — E1165 Type 2 diabetes mellitus with hyperglycemia: Secondary | ICD-10-CM | POA: Diagnosis not present

## 2015-09-20 DIAGNOSIS — Z992 Dependence on renal dialysis: Secondary | ICD-10-CM | POA: Diagnosis not present

## 2015-09-20 DIAGNOSIS — Z940 Kidney transplant status: Secondary | ICD-10-CM | POA: Diagnosis not present

## 2015-09-20 DIAGNOSIS — I120 Hypertensive chronic kidney disease with stage 5 chronic kidney disease or end stage renal disease: Secondary | ICD-10-CM | POA: Diagnosis not present

## 2015-09-20 LAB — BASIC METABOLIC PANEL
Anion gap: 9 (ref 5–15)
BUN: 12 mg/dL (ref 6–20)
CO2: 18 mmol/L — ABNORMAL LOW (ref 22–32)
Calcium: 8.4 mg/dL — ABNORMAL LOW (ref 8.9–10.3)
Chloride: 113 mmol/L — ABNORMAL HIGH (ref 101–111)
Creatinine, Ser: 0.9 mg/dL (ref 0.44–1.00)
GFR calc Af Amer: >60 mL/min (ref >60)
GFR calc non Af Amer: >60 mL/min (ref >60)
Glucose, Bld: 157 mg/dL — ABNORMAL HIGH (ref 65–99)
Potassium: 3.9 mmol/L (ref 3.5–5.1)
Sodium: 140 mmol/L (ref 135–145)

## 2015-09-20 LAB — GLUCOSE, CAPILLARY: Glucose-Capillary: 97 mg/dL (ref 65–99)

## 2015-09-20 MED ORDER — ASPIRIN 325 MG PO TBEC: 325.0000 mg | DELAYED_RELEASE_TABLET | Freq: Every day | ORAL | Status: DC

## 2015-09-20 NOTE — Discharge Instructions (Signed)
Angiogram An angiogram, also called angiography, is a procedure used to look at the blood vessels. In this procedure, dye is injected through a long, thin tube (catheter) into an artery. X-rays are then taken. The X-rays will show if there is a blockage or problem in a blood vessel.  LET [facility] CARE PROVIDER KNOW ABOUT:  Any allergies you have, including allergies to shellfish or contrast dye.   All medicines you are taking, including vitamins, herbs, eye drops, creams, and over-the-counter medicines.   Previous problems you or members of your family have had with the use of anesthetics.   Any blood disorders you have.   Previous surgeries you have had.  Any previous kidney problems or failure you have had.  Medical conditions you have.   Possibility of pregnancy, if this applies. RISKS AND COMPLICATIONS Generally, an angiogram is a safe procedure. However, as with any procedure, problems can occur. Possible problems include:  Injury to the blood vessels, including rupture or bleeding.  Infection or bruising at the catheter site.  Allergic reaction to the dye or contrast used.  Kidney damage from the dye or contrast used.  Blood clots that can lead to a stroke or heart attack. BEFORE THE PROCEDURE  Do not eat or drink after midnight on the night before the procedure, or as directed by your health care provider.   Ask your health care provider if you may drink enough water to take any needed medicines the morning of the procedure.  PROCEDURE  You may be given a medicine to help you relax (sedative) before and during the procedure. This medicine is given through an IV access tube that is inserted into one of your veins.   The area where the catheter will be inserted will be washed and shaved. This is usually done in the groin but may be done in the fold of your arm (near your elbow) or in the wrist.  A medicine will be given to numb the area where the catheter  will be inserted (local anesthetic).  The catheter will be inserted with a guide wire into an artery. The catheter is guided by using a type of X-ray (fluoroscopy) to the blood vessel being examined.   Dye is then injected into the catheter, and X-rays are taken. The dye helps to show where any narrowing or blockages are located.  AFTER THE PROCEDURE   If the procedure is done through the leg, you will be kept in bed lying flat for several hours. You will be instructed to not bend or cross your legs.  The insertion site will be checked frequently.  The pulse in your feet or wrist will be checked frequently.  Additional blood tests, X-rays, and electrocardiography may be done.   You may need to stay in the hospital overnight for observation.    This information is not intended to replace advice given to you by your health care provider. Make sure you discuss any questions you have with your health care provider.   Document Released: 03/13/2005 Document Revised: 06/24/2014 Document Reviewed: 11/04/2012 Elsevier Interactive Patient Education 2016 [employer]  Peripheral Vascular Disease Peripheral vascular disease (PVD) is a disease of the blood vessels that are not part of your heart and brain. A simple term for PVD is poor circulation. In most cases, PVD narrows the blood vessels that carry blood from your heart to the rest of your body. This can result in a decreased supply of blood to your arms, legs,  and internal organs, like your stomach or kidneys. However, it most often affects a person's lower legs and feet. There are two types of PVD.  Organic PVD. This is the more common type. It is caused by damage to the structure of blood vessels.  Functional PVD. This is caused by conditions that make blood vessels contract and tighten (spasm). Without treatment, PVD tends to get worse over time. PVD can also lead to acute ischemic limb. This is when an arm or limb suddenly has  trouble getting enough blood. This is a medical emergency.  HOME CARE  Take medicines only as told by your doctor.  Do not use any tobacco products, including cigarettes, chewing tobacco, or electronic cigarettes. If you need help quitting, ask your doctor.  Lose weight if you are overweight, and maintain a healthy weight as told by your doctor.  Eat a diet that is low in fat and cholesterol. If you need help, ask your doctor.  Exercise regularly. Ask your doctor for some good activities for you.  Take good care of your feet.  Wear comfortable shoes that fit well.  Check your feet often for any cuts or sores. GET HELP IF:  You have cramps in your legs while walking.  You have leg pain when you are at rest.  You have coldness in a leg or foot.  Your skin changes.  You are unable to get or have an erection (erectile dysfunction).  You have cuts or sores on your feet that are not healing. GET HELP RIGHT AWAY IF:  Your arm or leg turns cold and blue.  Your arms or legs become red, warm, swollen, painful, or numb.  You have chest pain or trouble breathing.  You suddenly have weakness in your face, arm, or leg.  You become very confused or you cannot speak.  You suddenly have a very bad headache.  You suddenly cannot see.   This information is not intended to replace advice given to you by your health care provider. Make sure you discuss any questions you have with your health care provider.   Document Released: 08/28/2009 Document Revised: 06/24/2014 Document Reviewed: 11/11/2013 Elsevier Interactive Patient Education [employer].

## 2015-09-20 NOTE — Discharge Summary (Signed)
Physician Discharge Summary  Patient ID: [name] MRN: [id number] DOB/AGE: Sep 18, 1970 45 y.o.  Admit date: 09/18/2015 Discharge date: 09/20/2015  Primary Discharge Diagnosis: PAD s/p PTA and stenting of the right common iliac artery with implantation of a 6.0 x 12 mm Omnilink balloon expandable stent  Secondary Discharge Diagnosis: 1. Benign essential hypertension 2. Uncontrolled Type II DM 3. H/O right renal transplant (01/24/2013)  Significant Diagnostic Studies: PV angiogram 09/19/2015:  Abdominal aortogram revealing no evidence of abdominal Aneurysm. Aorta iliac bifurcation was widely patent. Right distal common iliac artery had 80% stenosis just proximal to the transplanted kidney and also similar stenosis distal to the transplanted kidney at the site of the external iliac artery. There was a 50 mmHg pressure gradient across these stenosis. Hemodynamically significant. Right SFA in the midsegment shows a 70-80% stenosis. Three-vessel runoff below the right knee. However at the level of the ankle, all the vessels are occluded and the pedal arch is performed via faint collaterals. Posterior tibial appears to be dominant vessel. Left iliac and left common femoral artery widely patent. Left SFA and below the knee not studied.  Hospital Course: She is a 45 year old female with diabetes mellitus, end-stage renal disease, history of renal transplantation, who has been having severe lifestyle limiting claudication. Outpatient lower extremity arterial duplex had revealed severely dampened waveforms in the right external iliac and common femoral artery and suggestion of high-grade stenosis of the right mid SFA. She also had left SFA stenosis. Right leg being more symptomatic, due to history of kidney transplant, she was admitted to the hospital the previous day for hydration and underwent PV angiogram on 09/19/2015. She underwent successful PTA and stenting of the right common iliac artery with  implantation of a 6.0 x 12 mm Omnilink balloon expandable stent, 80% stenosis reduced to 0%.  Recommendations on discharge: The angioplasty hopefully will improve the inflow to the transplanted kidney. Due to persistence of right external iliac artery stenosis just distal to the transplant kidney attachment, she may have persistent right hip claudication however, would angioplasty would require stenting across the graft. Right SFA stenosis appears to be amenable for percutaneous the vascularization, depending on her symptoms we can certainly plan this in 4-6 weeks. Follow up outpatient next week for reevaluation and further recommendations.  Discharge Exam: Blood pressure 155/62, pulse 77, temperature 98.7 F (37.1 C), temperature source Oral, resp. rate 18, height 5' (1.524 m), weight 64.229 kg (141 lb 9.6 oz), SpO2 100 %.    General appearance: alert, cooperative, appears stated age and no distress Neck: no adenopathy, no JVD, supple, symmetrical, trachea midline, thyroid not enlarged, symmetric, no tenderness/mass/nodules and bilateral carotid bruits Resp: clear to auscultation bilaterally Cardio: S1, S2 normal, no S3 or S4 and II/VI mid systolic murmur at RUSB Extremities: extremities normal, atraumatic, no cyanosis or edema Pulses: Right Pulses: FEM: present 1+ and bruit, POP: barely palpable, DP: absent, PT: absent Left Pulses: FEM: present 2+ and bruit, POP: barely palpable, DP: absent, PT: absent Skin: Skin color, texture, turgor normal. No rashes or lesions Neurologic: Grossly normal  Left femoral access site tender but other wise asymptomatic  Labs:   Lab Results  Component Value Date   WBC 2.3* 09/19/2015   HGB 9.9* 09/19/2015   HCT 31.5* 09/19/2015   MCV 78.4 09/19/2015   PLT 165 09/19/2015    Recent Labs Lab 09/19/15 2357  NA 140  K 3.9  CL 113*  CO2 18*  BUN 12  CREATININE 0.90  CALCIUM 8.4*  GLUCOSE 157*    Lipid Panel     Component Value Date/Time   CHOL  176 03/30/2011 0158   TRIG 88 03/30/2011 0158   HDL 55 03/30/2011 0158   CHOLHDL 3.2 03/30/2011 0158   VLDL 18 03/30/2011 0158   LDLCALC 103* 03/30/2011 0158    BNP (last 3 results) No results for input(s): BNP in the last 8760 hours.  HEMOGLOBIN A1C Lab Results  Component Value Date   HGBA1C 6.3* 12/20/2012   MPG 134* 12/20/2012    Cardiac Panel (last 3 results) No results for input(s): CKTOTAL, CKMB, TROPONINI, RELINDX in the last 8760 hours.  Lab Results  Component Value Date   CKTOTAL 277* 03/30/2011   CKMB 4.0 03/30/2011   TROPONINI <0.30 12/21/2012     TSH No results for input(s): TSH in the last 8760 hours.  Radiology: No results found.    FOLLOW UP PLANS AND APPOINTMENTS    Medication List    TAKE these medications        albuterol 108 (90 Base) MCG/ACT inhaler  Commonly known as:  PROVENTIL HFA;VENTOLIN HFA  Inhale 1 puff into the lungs every 6 (six) hours as needed for wheezing or shortness of breath.     amLODipine 10 MG tablet  Commonly known as:  NORVASC  Take 10 mg by mouth at bedtime.     aspirin 325 MG EC tablet  Take 1 tablet (325 mg total) by mouth daily.     cilostazol 100 MG tablet  Commonly known as:  PLETAL  Take 100 mg by mouth 2 (two) times daily.     insulin aspart 100 UNIT/ML injection  Commonly known as:  novoLOG  Inject 4-14 Units into the skin 3 (three) times daily before meals. Per sliding scale: 100-150, 4 units. 151-200, 6 units. 201-250, 8 units. 251-300, 10 units. 301-350, 12 units. Over 350, 14 units.     insulin detemir 100 UNIT/ML injection  Commonly known as:  LEVEMIR  Inject 16-22 Units into the skin See admin instructions. 16 units in the morning and 22 units at bedtime     labetalol 300 MG tablet  Commonly known as:  NORMODYNE  Take 300 mg by mouth daily.     mycophenolate 180 MG EC tablet  Commonly known as:  MYFORTIC  Take 360-540 mg by mouth 2 (two) times daily. 540 mg in the morning and 360 mg at  night     pantoprazole 40 MG tablet  Commonly known as:  PROTONIX  Take 40 mg by mouth daily.     pravastatin 20 MG tablet  Commonly known as:  PRAVACHOL  Take 20 mg by mouth at bedtime.     sulfamethoxazole-trimethoprim 400-80 MG tablet  Commonly known as:  BACTRIM,SEPTRA  Take 1 tablet by mouth every Monday, Wednesday, and Friday.     tacrolimus 1 MG capsule  Commonly known as:  PROGRAF  Take 1.5 mg by mouth 2 (two) times daily.     Vitamin D (Ergocalciferol) 50000 units Caps capsule  Commonly known as:  DRISDOL  Take 50,000 Units by mouth every 7 (seven) days. Sundays       Follow-up Information    Follow up with Adrian Prows, MD On 09/27/2015.   Specialty:  Cardiology   Why:  at 1:45pm   Contact information:   [name] [address] [address], NP-C 09/20/2015, 8:14 AM [facility]  Pager: [phone] Office: [phone]

## 2015-09-27 DIAGNOSIS — Z940 Kidney transplant status: Secondary | ICD-10-CM | POA: Diagnosis not present

## 2015-09-27 DIAGNOSIS — I10 Essential (primary) hypertension: Secondary | ICD-10-CM | POA: Diagnosis not present

## 2015-09-27 DIAGNOSIS — I739 Peripheral vascular disease, unspecified: Secondary | ICD-10-CM | POA: Diagnosis not present

## 2015-10-02 DIAGNOSIS — D631 Anemia in chronic kidney disease: Secondary | ICD-10-CM | POA: Diagnosis not present

## 2015-10-02 DIAGNOSIS — I10 Essential (primary) hypertension: Secondary | ICD-10-CM | POA: Diagnosis not present

## 2015-10-02 DIAGNOSIS — Z01810 Encounter for preprocedural cardiovascular examination: Secondary | ICD-10-CM | POA: Diagnosis not present

## 2015-10-02 DIAGNOSIS — I739 Peripheral vascular disease, unspecified: Secondary | ICD-10-CM | POA: Diagnosis not present

## 2015-10-03 NOTE — H&P (Signed)
OFFICE VISIT NOTES COPIED TO EPIC FOR DOCUMENTATION  . [name] [place] 09/27/2015 2:13 PM Location: [facility] Patient #: [id number] DOB: [birth year] Single / Language: [name] / Race: Black or African American Female   History of Present Illness Joy Page MD; 09/27/2015 7:02 PM) Patient words: 7-10 day F/U for PV angiogram; last office vist 08/29/2015.  The patient is a 45 year old female who presents with peripheral vascular disease. She is s/p renal transplantation due to diabetic and hypertensive renal disease on 01/23/2013 at [facility] and is presently under the care of transplant program and followed locally by Dr. Elmarie [name]. She has HTN, orthostatic hypotension due to diabetic neuropathy, diabetic retinopathy and PAD. She reported severe pain in both her legs, frequent cramping in her legs, and feels weak in her legs at times with activity. She describes it as a burning in her legs with minimal activity, such as walking from her car into the office. She underwent peripheral arteriogram on 09/19/2015, at the behest of the patient's request due to severe rest pain. She underwent successful angioplasty to the In-Flow of the right common iliac artery at the junction of external and internal iliac artery. However at the junction where the renal transplant artery anastamosis, there is still about a 70% or 80% stenosis with a 40-50 mm Hg PG, however this could not be treated due to presence of renal transplant. The renal artery of the transplanted kidney itself was widely patent..  She now presents here to the office and states that she has had worsening symptoms of leg pain especially at the ankle. She has had difficulty in walking. Felt well for 2 days after the discharge but started having severe pain again. No bluish discoloration or ulceration in her feet. She also reports worsening dizziness when standing. Patient has a history of orthostatic hypotension.  She denies CP, SOB, orthopnea, or syncope.   Problem List/Past Medical [name]; 09/27/2015 2:13 PM) Neurogenic orthostatic hypotension (G90.3) Diabetic autonomic neuropathy associated with type 2 diabetes mellitus (E11.43) Claudication, intermittent (I73.9) Peripheral arteriogram 09/19/2015: Right CIA at the bifurcation 80%. with tandem 80% at the anastamosis of renal transplant stump. Right mid SFA 80%. 3 vessel r/o upto ankle then all vessels severly diseased and ankle is filled by faint collaterals. S/P 6x14 mm stent into right CIA and stenosis at the transplant insertion site (at bifurcation and EIA) left alone. If symptoms persist, PTA right SFA Lower extremity arterial duplex 08/22/2015: Monophasic waveforms evident in the right external iliac and common/for left femoral artery suggest severe diffuse plaque, rate mid to distal SFA appears to be nearly occluded with dampened monophasic waveform distally in the popliteal artery and below the knee. Monophasic waveforms in the AT/PT. ABI could not be calculated due to noncompressible vessels. Left external and common femoral artery appears to show greater than 50% stenosis. Severe dampened monophasic waveforms throughout the left SFA. Left AT dampened monophasic form and PTT appears occluded. ABI could not be calculated due to noncompressible vessels. Abnormal Dopplers consider further workup. Essential hypertension, benign (I10) Bilateral carotid bruits (R09.89) Carotid duplex 08/20/12: 1. No evidence of hemodynamically significant stenosis in the bilateral carotid bifurcation vessels. There is evidence of homogeneous plaque in the right carotid artery. There is evidence of homogeneous plaque in the left carotid artery. Mild right intimal thickening. Mild left intimal thickening. 2. Compared to the study 01/30/12: Left ICA stenosis not evident. Further studies if clinically indicated only. Uncontrolled type 2 diabetes mellitus with  chronic kidney disease, without long-term current use of insulin, unspecified CKD stage (E11.22, E11.65) End stage renal disease (585.6) Anemia in chronic kidney disease (285.21) Hypokalemia (E87.6) Immunosuppression (D89.9) Hypomagnesemia (E83.42) Dehydration (E86.0) Acidosis, metabolic (W09.8) Leukopenia (D72.819) CMV (cytomegalovirus) (B25.9) Kidney transplant recipient (Z94.0)01/23/2013 Done at [facility] on 01/23/2013. Follows Joy Shiley, MD locally. [employer] study: Renal duplex 09/07/2013: Patent transplant vasculature, resistive indicis are increasing. The renal artery velocity at the anastomosis has increased from prior study, However this may indicate elevated velocity in the proximal iliac artery rather than presence of true stenosis.  Allergies ([name]; 09/27/2015 2:13 PM) No Known Drug Allergies01/31/2014  Family History [name]; 09/27/2015 2:25 PM) Mother is 90 yrs. old-Hypertension. Brother died at age 33 from CHF with complications from diabetes. Father living; h/o HTN, had triple by-pass  Social History [name]; 09/27/2015 2:13 PM) No smoking. No alcohol. Marital status Single. Living Situation Lives with parents. Number of Children 0.  Past Surgical History [name]; 09/27/2015 2:13 PM) 09/29/11 right arm AV fistula [name].  Failed left April 19th 2013 Transplantation; Renal08/03/2013 Right.  Medication History ([name]; 09/27/2015 2:25 PM) Cilostazol ('100MG'$  Tablet, 1 (one) Tablet Tablet Oral two times daily, Taken starting 08/02/2015) Active. Protonix ('40MG'$  Tablet DR, 1 Oral at bedtime) Active. Pravastatin Sodium ('20MG'$  Tablet, 1 Oral at bedtime) Active. AmLODIPine Besylate ('10MG'$  Tablet, 1 Oral at bedtime) Active. Labetalol HCl ('300MG'$  Tablet, 1 Oral daily) Active. PredniSONE (Pak) ('5MG'$  Tablet, 1 Oral daily) Active. Myfortic ('180MG'$  Tablet DR, 3 Oral two times  daily) Active. Bactrim (400-'80MG'$  Tablet, 1 Oral M,W,Fri) Active. NovoLOG FlexPen (100UNIT/ML Soln Pen-inj, sliding scale Subcutaneous) Active. Levemir FlexTouch (100UNIT/ML Soln Pen-inj, 14 U in the AM, 22 U in the PM Subcutaneous) Active. Prograf ('1MG'$  Capsule, 2 in the AM Oral 2 in the PM) Active. Aspirin EC ('325MG'$  Tablet DR, 1 Oral daily) Active. Albuterol Sulfate HFA (108 (90 Base)MCG/ACT Aerosol Soln, 1 to 2 puffs Inhalation as needed) Active. Furosemide ('20MG'$  Tablet, 1 Oral as needed) Active. Medications Reconciled (verbally with pt/ list present)  Diagnostic Studies History [name]; 09/27/2015 2:24 PM) Carotid duplex 01/29/12: Left ICA 50-69% stenosis. Right no stenosis (was noted prev). Recheck 6 mont Echocardiogram done on 03/30/2011 reveals normal LV systolic function. Mild diastolic dysfunction. Trivial pericardial effusion.LVH.  ECG 06/28/11: NSR @ 76/min. Normal intervals. non specific ST inf leads. no ischemia.  Renal duplex 07/29/11: Normal renal duplex scan. Lower Extremity Dopplers Lower extremity arterial duplex 11/01/2013: 1, Monophasic waveform in the iliac and proximal femoral artery suggests diffuse disease < 50% or proximal high grade stenosis (common iliac). Moderate velocity increase at right SFA-mid suggests >70% stenosis. 2. Monophasic waveforms in the mid to distal SFA may suggest proximal occlusion and filling by collaterals. No focal hemodynamically significant stenosis is identified in the left lower extremity arterial system. 3. ABI could not be obtained due to non-compressible vessels. Clinical correlation recommended. Labwork Labs 09/14/2015: BUN 15, serum creatinine 1.14, eGFR 67 mL. Serum glucose 171 mg, potassium 4.7. CMP otherwise normal. Pro time normal at 10.1 seconds. Hemoglobin 9.9/hematocrit 29, microcytic indicis. 05/04/2015: Serum glucose 198, creatinine 0.95, potassium 4.7, WBC 1.6, HB 11.0 with microcytic indices, vitamin D 16  08/12/2014: Serum glucose 180, BUN 23, creatinine 0.88, eGFR 92, potassium 4.6, CMP otherwise normal, CBC normal HbA1c 12/20/2013: 8.4% 11/02/2013: Potassium 4.6, BUN 20, serum creatinine 0.85. 10/05/2013: HB 11.1/HCT 37.1. CMP revealed BUN of 25, serum creatinine 0.88. Electrolytes within normal limits. Endoscopy05/2015 Normal. at [facility]  Angiogram04/09/2015 Right CIA at the bifurcation 80%. with tandem 80% at the anastamosis of renal transplant stump. Right mid SFA 80%. 3 vessel r/o upto ankle then all vessels severly diseased and ankle is filled by faint collaterals. S/P 6x14 mm stent into right CIA and stenosis at the transplant insertion site (at bifurcation and EIA) left alone. If symptoms persist, PTA right SFA  Other Problems [name]; 09/27/2015 2:13 PM) Dizziness and giddiness (R42) Now on Hemodialysis since 09/28/10 Hospitalized at [facility] from 10/12-10/18/12 for CHF, cardiomegaly, hypertension, diabetes, chronic kidney failure.     Review of Systems Joy Page MD; 09/27/2015 3:24 PM) General Not Present- Anorexia, Fatigue and Fever. Respiratory Not Present- Cough, Decreased Exercise Tolerance and Dyspnea. Cardiovascular Present- Claudications, Leg Cramps and Night Cramps. Not Present- Chest Pain, Edema, Orthopnea, Paroxysmal Nocturnal Dyspnea and Shortness of Breath. Gastrointestinal Not Present- Change in Bowel Habits, Constipation and Nausea. Neurological Not Present- Dizziness and Syncope. Endocrine Not Present- Appetite Changes, Cold Intolerance and Heat Intolerance. Hematology Not Present- Anemia, Petechiae and Prolonged Bleeding. All other systems negative  Vitals [name]; 09/27/2015 2:27 PM) 09/27/2015 2:22 PM Weight: 144 lb Height: 62in Weight was reported by patient. Body Surface Area: 1.66 m Body Mass Index: 26.34 kg/m  Pulse: 107 (Regular)  P.OX: 98% (Room air) BP: 150/60 (Sitting, Left Arm, Standard) Pt is unable to  stand      Physical Exam Joy Page MD; 09/27/2015 3:19 PM) General Mental Status-Alert. General Appearance-Cooperative, Appears stated age, Not in acute distress. Orientation-Oriented X3. Build & Nutrition-Short statured and Martin.  Head and Neck Thyroid Gland Characteristics - no palpable nodules, no palpable enlargement.  Chest and Lung Exam Palpation Tender - No chest wall tenderness. Auscultation Breath sounds - Clear.  Cardiovascular Inspection Jugular vein - Right - No Distention. Auscultation Heart Sounds - S1 WNL, S2 WNL and No gallop present. Murmurs & Other Heart Sounds: Murmur - Location - Aortic Area. Timing - Mid-systolic. Grade - II/VI. Note: I-II/VI.  Abdomen Palpation/Percussion Normal exam - Non Tender and No hepatosplenomegaly. Auscultation Normal exam - Bowel sounds normal. Abdominal bruit - Right Lower Quadrant(over transplant site).  Peripheral Vascular Lower Extremity Inspection - Left - No Pigmentation, No Varicose veins. Right - No Pigmentation, No Varicose veins. Auscultation - Right Groin Bruit Present. Palpation - Temperature - Right - Cool(right foot). Edema - Bilateral - No edema. Femoral pulse - Left - 2+ and Bruit. Right - Feeble. Popliteal pulse - Bilateral - Absent. Dorsalis pedis pulse - Bilateral - Absent. Posterior tibial pulse - Bilateral - Absent. Carotid arteries - Bilateral-Carotid bruit. Abdomen-No prominent abdominal aortic pulsation, No epigastric bruit.  Neurologic Motor-Grossly intact without any focal deficits.  Musculoskeletal Global Assessment Left Lower Extremity - normal range of motion without pain. Right Lower Extremity - normal range of motion without pain.    Assessment & Plan Joy Page MD; 09/27/2015 7:05 PM)  Claudication, intermittent (I73.9) Story: Peripheral arteriogram 09/19/2015: Right CIA at the bifurcation 80%. with tandem 80% at the anastamosis of renal transplant  stump. Right mid SFA 80%. 3 vessel r/o upto ankle then all vessels severly diseased and ankle is filled by faint collaterals. S/P 6x14 mm stent into right CIA and stenosis at the transplant insertion site (at bifurcation and EIA) left alone. If symptoms persist, PTA right SFA  Lower extremity arterial duplex 08/22/2015: Monophasic waveforms evident in the right external iliac and common/for left femoral artery suggest severe diffuse plaque, rate mid to distal SFA appears to be  nearly occluded with dampened monophasic waveform distally in the popliteal artery and below the knee. Monophasic waveforms in the AT/PT. ABI could not be calculated due to noncompressible vessels. Left external and common femoral artery appears to show greater than 50% stenosis. Severe dampened monophasic waveforms throughout the left SFA. Left AT dampened monophasic form and PTT appears occluded. ABI could not be calculated due to noncompressible vessels. Abnormal Dopplers consider further workup. Current Plans Started Clopidogrel Bisulfate 75MG, 1 (one) Tablet daily, #90, 09/27/2015, No Refill. Future Plans 01/05/5749: METABOLIC PANEL, BASIC (51833) - one time 09/28/2015: CBC & PLATELETS (AUTO) (58251) - one time 09/28/2015: PT (PROTHROMBIN TIME) (89842) - one time  Kidney transplant recipient (Z94.0) Story: Done at [facility] on 01/23/2013. Follows Joy Shiley, MD locally.  [employer] study: Renal duplex 09/07/2013: Patent transplant vasculature, resistive indicis are increasing. The renal artery velocity at the anastomosis has increased from prior study, However this may indicate elevated velocity in the proximal iliac artery rather than presence of true stenosis.  Essential hypertension, benign (I10) Impression: EKG 08/02/2015: Sinus rhythm at a rate of 86 bpm, normal axis, normal intervals, nonspecific ST and T wave abnormality. No significant change from EKG on 07/29/2014.   Current  Plans Mechanism of underlying disease process and action of medications discussed with the patient. Patient has who right foot, severe pain, I suspect she has critical limb ischemia non-limb threatening. Main issue is that during diagnostic angiography, all other vessels stopped about the ankle with no runoff to her foot. She has residual right SFA stenosis, I'll try to set her up for repeat angiography and see if I can proceed with angioplasty of the right SFA to see if we can save her foot from amputation. I'm very concerned that on a long run she has a very high risk for amputation as she has no native vessel runoff below the ankle. She has severe diabetic vasculopathy.  I have taken her off of work for now. I will also contact the transplant surgeon and discussed the scenario off iliac artery stenosis right at the insertion site of the transplanted kidney. Office visit after the angiography. She is advised to contact me if her right leg pain continues to be worse or if she notices loss of sensation or darkening of the skin.  I attempted to contact the transplant team to discuss, but her MD was out of town and I was unable to talk to anyone else and have left message. She will need to see them soon to discuss PAD and problems of inflow stenosis at the iliac artery to the transplanted kidney.  CC Dr. Elmarie [name]. Renal transplant clinic, [facility].  METABOLIC PANEL, BASIC (10312) Addendum Note(Jagadeesh Carlynn Herald MD; 10/03/2015 7:07 PM) Labs 04/70/2017: Serum glucose 445 mg. BUN 16, serum creatinine 1.12, eGFR 68 mL. The calcium 4.2. Hemoglobin 9.0/hematocrit 27.0. Microcytic indicis. WBC markedly reduced at 2.5 with absolute neutrophil count reduced to 1.0. Platelet count 377. Pro time/INR normal.  Labs are stable for proceeding with peripheral arteriogram.  Signed by Joy Page, MD (09/27/2015 7:06 PM)

## 2015-10-05 ENCOUNTER — Encounter (HOSPITAL_COMMUNITY): Payer: Self-pay | Admitting: General Practice

## 2015-10-05 ENCOUNTER — Inpatient Hospital Stay (HOSPITAL_COMMUNITY)
Admission: RE | Admit: 2015-10-05 | Discharge: 2015-10-11 | DRG: 253 | Disposition: A | Discharge status: Home Health | Payer: Medicare Other | Source: Ambulatory Visit | Attending: Cardiology | Admitting: Cardiology

## 2015-10-05 DIAGNOSIS — D638 Anemia in other chronic diseases classified elsewhere: Secondary | ICD-10-CM | POA: Diagnosis present

## 2015-10-05 DIAGNOSIS — Z794 Long term (current) use of insulin: Secondary | ICD-10-CM | POA: Diagnosis not present

## 2015-10-05 DIAGNOSIS — I998 Other disorder of circulatory system: Principal | ICD-10-CM | POA: Diagnosis present

## 2015-10-05 DIAGNOSIS — E7800 Pure hypercholesterolemia, unspecified: Secondary | ICD-10-CM | POA: Diagnosis not present

## 2015-10-05 DIAGNOSIS — E8889 Other specified metabolic disorders: Secondary | ICD-10-CM | POA: Diagnosis present

## 2015-10-05 DIAGNOSIS — Z833 Family history of diabetes mellitus: Secondary | ICD-10-CM | POA: Diagnosis not present

## 2015-10-05 DIAGNOSIS — T508X5A Adverse effect of diagnostic agents, initial encounter: Secondary | ICD-10-CM | POA: Diagnosis not present

## 2015-10-05 DIAGNOSIS — I70261 Atherosclerosis of native arteries of extremities with gangrene, right leg: Secondary | ICD-10-CM | POA: Diagnosis not present

## 2015-10-05 DIAGNOSIS — E11319 Type 2 diabetes mellitus with unspecified diabetic retinopathy without macular edema: Secondary | ICD-10-CM | POA: Diagnosis not present

## 2015-10-05 DIAGNOSIS — E1142 Type 2 diabetes mellitus with diabetic polyneuropathy: Secondary | ICD-10-CM | POA: Diagnosis not present

## 2015-10-05 DIAGNOSIS — M62271 Nontraumatic ischemic infarction of muscle, right ankle and foot: Secondary | ICD-10-CM | POA: Diagnosis not present

## 2015-10-05 DIAGNOSIS — D62 Acute posthemorrhagic anemia: Secondary | ICD-10-CM | POA: Diagnosis not present

## 2015-10-05 DIAGNOSIS — N182 Chronic kidney disease, stage 2 (mild): Secondary | ICD-10-CM | POA: Diagnosis present

## 2015-10-05 DIAGNOSIS — E1152 Type 2 diabetes mellitus with diabetic peripheral angiopathy with gangrene: Secondary | ICD-10-CM | POA: Diagnosis not present

## 2015-10-05 DIAGNOSIS — E1122 Type 2 diabetes mellitus with diabetic chronic kidney disease: Secondary | ICD-10-CM | POA: Diagnosis not present

## 2015-10-05 DIAGNOSIS — Z89021 Acquired absence of right finger(s): Secondary | ICD-10-CM

## 2015-10-05 DIAGNOSIS — I10 Essential (primary) hypertension: Secondary | ICD-10-CM | POA: Diagnosis not present

## 2015-10-05 DIAGNOSIS — E872 Acidosis: Secondary | ICD-10-CM | POA: Diagnosis not present

## 2015-10-05 DIAGNOSIS — Z940 Kidney transplant status: Secondary | ICD-10-CM | POA: Diagnosis not present

## 2015-10-05 DIAGNOSIS — Z7982 Long term (current) use of aspirin: Secondary | ICD-10-CM

## 2015-10-05 DIAGNOSIS — I70229 Atherosclerosis of native arteries of extremities with rest pain, unspecified extremity: Secondary | ICD-10-CM | POA: Diagnosis present

## 2015-10-05 DIAGNOSIS — M79604 Pain in right leg: Secondary | ICD-10-CM | POA: Diagnosis present

## 2015-10-05 DIAGNOSIS — Z7952 Long term (current) use of systemic steroids: Secondary | ICD-10-CM

## 2015-10-05 DIAGNOSIS — I70211 Atherosclerosis of native arteries of extremities with intermittent claudication, right leg: Secondary | ICD-10-CM | POA: Diagnosis not present

## 2015-10-05 DIAGNOSIS — Z8249 Family history of ischemic heart disease and other diseases of the circulatory system: Secondary | ICD-10-CM

## 2015-10-05 DIAGNOSIS — E1140 Type 2 diabetes mellitus with diabetic neuropathy, unspecified: Secondary | ICD-10-CM | POA: Diagnosis not present

## 2015-10-05 DIAGNOSIS — N141 Nephropathy induced by other drugs, medicaments and biological substances: Secondary | ICD-10-CM | POA: Diagnosis present

## 2015-10-05 DIAGNOSIS — E1129 Type 2 diabetes mellitus with other diabetic kidney complication: Secondary | ICD-10-CM | POA: Diagnosis not present

## 2015-10-05 DIAGNOSIS — N179 Acute kidney failure, unspecified: Secondary | ICD-10-CM | POA: Diagnosis present

## 2015-10-05 DIAGNOSIS — Z79899 Other long term (current) drug therapy: Secondary | ICD-10-CM | POA: Diagnosis not present

## 2015-10-05 HISTORY — DX: Personal history of other medical treatment: Z92.89

## 2015-10-05 HISTORY — DX: Type 2 diabetes mellitus without complications: E11.9

## 2015-10-05 HISTORY — DX: Pure hypercholesterolemia, unspecified: E78.00

## 2015-10-05 HISTORY — DX: Atherosclerosis of native arteries of extremities with rest pain, unspecified extremity: I70.229

## 2015-10-05 LAB — CBC
HCT: 30.7 % — ABNORMAL LOW (ref 36.0–46.0)
Hemoglobin: 9.7 g/dL — ABNORMAL LOW (ref 12.0–15.0)
MCH: 24.2 pg — ABNORMAL LOW (ref 26.0–34.0)
MCHC: 31.6 g/dL (ref 30.0–36.0)
MCV: 76.6 fL — ABNORMAL LOW (ref 78.0–100.0)
Platelets: 417 10*3/uL — ABNORMAL HIGH (ref 150–400)
RBC: 4.01 MIL/uL (ref 3.87–5.11)
RDW: 14.3 % (ref 11.5–15.5)
WBC: 2.8 10*3/uL — ABNORMAL LOW (ref 4.0–10.5)

## 2015-10-05 LAB — GLUCOSE, CAPILLARY: Glucose-Capillary: 174 mg/dL — ABNORMAL HIGH (ref 65–99)

## 2015-10-05 LAB — HCG, SERUM, QUALITATIVE: Preg, Serum: NEGATIVE

## 2015-10-05 LAB — BASIC METABOLIC PANEL
Anion gap: 17 — ABNORMAL HIGH (ref 5–15)
BUN: 19 mg/dL (ref 6–20)
CO2: 20 mmol/L — ABNORMAL LOW (ref 22–32)
Calcium: 9.7 mg/dL (ref 8.9–10.3)
Chloride: 100 mmol/L — ABNORMAL LOW (ref 101–111)
Creatinine, Ser: 1.14 mg/dL — ABNORMAL HIGH (ref 0.44–1.00)
GFR calc Af Amer: >60 mL/min (ref >60)
GFR calc non Af Amer: 57 mL/min — ABNORMAL LOW (ref >60)
Glucose, Bld: 185 mg/dL — ABNORMAL HIGH (ref 65–99)
Potassium: 4.1 mmol/L (ref 3.5–5.1)
Sodium: 137 mmol/L (ref 135–145)

## 2015-10-05 MED ORDER — PANTOPRAZOLE SODIUM 40 MG PO TBEC
40.0000 mg | DELAYED_RELEASE_TABLET | Freq: Every day | ORAL | Status: DC
  Administered 2015-10-05 – 2015-10-11 (×6): 40 mg via ORAL
  Filled 2015-10-05 (×6): qty 1

## 2015-10-05 MED ORDER — MYCOPHENOLATE SODIUM 180 MG PO TBEC
540.0000 mg | DELAYED_RELEASE_TABLET | Freq: Every day | ORAL | Status: DC
  Administered 2015-10-07 – 2015-10-11 (×5): 540 mg via ORAL
  Filled 2015-10-05 (×6): qty 3

## 2015-10-05 MED ORDER — INSULIN DETEMIR 100 UNIT/ML ~~LOC~~ SOLN
16.0000 [IU] | Freq: Every day | SUBCUTANEOUS | Status: DC
  Administered 2015-10-07 – 2015-10-10 (×2): 16 [IU] via SUBCUTANEOUS
  Filled 2015-10-05 (×6): qty 0.16

## 2015-10-05 MED ORDER — INSULIN ASPART 100 UNIT/ML ~~LOC~~ SOLN
0.0000 [IU] | Freq: Three times a day (TID) | SUBCUTANEOUS | Status: DC
  Administered 2015-10-07: 3 [IU] via SUBCUTANEOUS
  Administered 2015-10-07 – 2015-10-10 (×3): 4 [IU] via SUBCUTANEOUS

## 2015-10-05 MED ORDER — ACETAMINOPHEN 325 MG PO TABS
650.0000 mg | ORAL_TABLET | ORAL | Status: DC | PRN
  Administered 2015-10-07: 650 mg via ORAL
  Filled 2015-10-05: qty 2

## 2015-10-05 MED ORDER — LABETALOL HCL 300 MG PO TABS
300.0000 mg | ORAL_TABLET | Freq: Every day | ORAL | Status: DC
  Administered 2015-10-05 – 2015-10-11 (×6): 300 mg via ORAL
  Filled 2015-10-05 (×6): qty 1

## 2015-10-05 MED ORDER — ALBUTEROL SULFATE (2.5 MG/3ML) 0.083% IN NEBU: 2.5000 mg | INHALATION_SOLUTION | Freq: Four times a day (QID) | RESPIRATORY_TRACT | Status: DC | PRN

## 2015-10-05 MED ORDER — HEPARIN SODIUM (PORCINE) 5000 UNIT/ML IJ SOLN
5000.0000 [IU] | Freq: Three times a day (TID) | INTRAMUSCULAR | Status: DC
  Administered 2015-10-05 – 2015-10-06 (×3): 5000 [IU] via SUBCUTANEOUS
  Filled 2015-10-05 (×3): qty 1

## 2015-10-05 MED ORDER — TACROLIMUS 1 MG PO CAPS
1.5000 mg | ORAL_CAPSULE | Freq: Two times a day (BID) | ORAL | Status: DC
  Administered 2015-10-05 – 2015-10-11 (×11): 1.5 mg via ORAL
  Filled 2015-10-05 (×12): qty 1

## 2015-10-05 MED ORDER — AMLODIPINE BESYLATE 10 MG PO TABS
10.0000 mg | ORAL_TABLET | Freq: Every day | ORAL | Status: DC
  Administered 2015-10-05 – 2015-10-10 (×6): 10 mg via ORAL
  Filled 2015-10-05 (×6): qty 1

## 2015-10-05 MED ORDER — ONDANSETRON HCL 4 MG/2ML IJ SOLN
4.0000 mg | Freq: Four times a day (QID) | INTRAMUSCULAR | Status: DC | PRN
  Administered 2015-10-07: 4 mg via INTRAVENOUS
  Filled 2015-10-05: qty 2

## 2015-10-05 MED ORDER — SODIUM CHLORIDE 0.9 % IV SOLN
INTRAVENOUS | Status: DC
  Administered 2015-10-05: 20:00:00 via INTRAVENOUS

## 2015-10-05 MED ORDER — OXYCODONE-ACETAMINOPHEN 5-325 MG PO TABS
1.0000 | ORAL_TABLET | ORAL | Status: DC | PRN
  Administered 2015-10-05 (×2): 2 via ORAL
  Administered 2015-10-06: 1 via ORAL
  Filled 2015-10-05 (×3): qty 2

## 2015-10-05 MED ORDER — INSULIN DETEMIR 100 UNIT/ML ~~LOC~~ SOLN
22.0000 [IU] | Freq: Every day | SUBCUTANEOUS | Status: DC
  Administered 2015-10-05 – 2015-10-08 (×3): 22 [IU] via SUBCUTANEOUS
  Filled 2015-10-05 (×7): qty 0.22

## 2015-10-05 MED ORDER — TACROLIMUS 0.5 MG PO CAPS: 0.5000 mg | ORAL_CAPSULE | Freq: Two times a day (BID) | ORAL | Status: DC

## 2015-10-05 MED ORDER — CILOSTAZOL 100 MG PO TABS
100.0000 mg | ORAL_TABLET | Freq: Two times a day (BID) | ORAL | Status: DC
  Administered 2015-10-05 – 2015-10-11 (×11): 100 mg via ORAL
  Filled 2015-10-05 (×12): qty 1

## 2015-10-05 MED ORDER — SULFAMETHOXAZOLE-TRIMETHOPRIM 400-80 MG PO TABS
1.0000 | ORAL_TABLET | ORAL | Status: DC
  Administered 2015-10-09 – 2015-10-11 (×2): 1 via ORAL
  Filled 2015-10-05 (×3): qty 1

## 2015-10-05 MED ORDER — PRAVASTATIN SODIUM 20 MG PO TABS
20.0000 mg | ORAL_TABLET | Freq: Every day | ORAL | Status: DC
  Administered 2015-10-05 – 2015-10-10 (×6): 20 mg via ORAL
  Filled 2015-10-05 (×6): qty 1

## 2015-10-05 MED ORDER — SODIUM CHLORIDE 0.9% FLUSH
3.0000 mL | INTRAVENOUS | Status: DC | PRN
  Administered 2015-10-10: 3 mL via INTRAVENOUS
  Filled 2015-10-05: qty 3

## 2015-10-05 MED ORDER — MYCOPHENOLATE SODIUM 180 MG PO TBEC: 360.0000 mg | DELAYED_RELEASE_TABLET | Freq: Two times a day (BID) | ORAL | Status: DC

## 2015-10-05 MED ORDER — SODIUM CHLORIDE 0.9% FLUSH
3.0000 mL | Freq: Two times a day (BID) | INTRAVENOUS | Status: DC
  Administered 2015-10-07 – 2015-10-09 (×3): 3 mL via INTRAVENOUS

## 2015-10-05 MED ORDER — MYCOPHENOLATE SODIUM 180 MG PO TBEC
360.0000 mg | DELAYED_RELEASE_TABLET | Freq: Every day | ORAL | Status: DC
  Administered 2015-10-05 – 2015-10-10 (×6): 360 mg via ORAL
  Filled 2015-10-05 (×6): qty 2

## 2015-10-05 MED ORDER — ASPIRIN EC 325 MG PO TBEC
325.0000 mg | DELAYED_RELEASE_TABLET | Freq: Every day | ORAL | Status: DC
Start: 2015-10-05 — End: 2015-10-07
  Filled 2015-10-05: qty 1

## 2015-10-05 MED ORDER — INSULIN ASPART 100 UNIT/ML ~~LOC~~ SOLN
3.0000 [IU] | Freq: Three times a day (TID) | SUBCUTANEOUS | Status: DC
  Administered 2015-10-07 – 2015-10-10 (×2): 3 [IU] via SUBCUTANEOUS

## 2015-10-05 MED ORDER — SODIUM CHLORIDE 0.9 % IV SOLN: 250.0000 mL | INTRAVENOUS | Status: DC | PRN

## 2015-10-05 MED ORDER — INSULIN DETEMIR 100 UNIT/ML ~~LOC~~ SOLN: 16.0000 [IU] | SUBCUTANEOUS | Status: DC

## 2015-10-05 NOTE — H&P (Addendum)
OFFICE VISIT NOTES COPIED TO EPIC FOR DOCUMENTATION  .  The patient is a 45 year old female who presents with peripheral vascular disease. She is s/p renal transplantation due to diabetic and hypertensive renal disease on 01/23/2013 at [facility] and is presently under the care of transplant program and followed locally by Dr. Elmarie Shiley. She has HTN, orthostatic hypotension due to diabetic neuropathy, diabetic retinopathy and PAD. She reported severe pain in both her legs, frequent cramping in her legs, and feels weak in her legs at times with activity. She describes it as a burning in her legs with minimal activity, such as walking from her car into the office. She underwent peripheral arteriogram on 09/19/2015, at the behest of the patient's request due to severe rest pain. She underwent successful angioplasty to the In-Flow of the right common iliac artery at the junction of external and internal iliac artery. However at the junction where the renal transplant artery anastamosis, there is still about a 70% or 80% stenosis with a 40-50 mm Hg PG, however this could not be treated due to presence of renal transplant. The renal artery of the transplanted kidney itself was widely patent..  She now presents here to the office and states that she has had worsening symptoms of leg pain especially at the ankle. She has had difficulty in walking. Felt well for 2 days after the discharge but started having severe pain again. No bluish discoloration or ulceration in her feet. She also reports worsening dizziness when standing. Patient has a history of orthostatic hypotension. She denies CP, SOB, orthopnea, or syncope.   Problem List/Past Medical [name]; 09/27/2015 2:13 PM) Neurogenic orthostatic hypotension (G90.3) Diabetic autonomic neuropathy associated with type 2 diabetes mellitus (E11.43) Claudication, intermittent (I73.9) Peripheral arteriogram 09/19/2015: Right CIA at the  bifurcation 80%. with tandem 80% at the anastamosis of renal transplant stump. Right mid SFA 80%. 3 vessel r/o upto ankle then all vessels severly diseased and ankle is filled by faint collaterals. S/P 6x14 mm stent into right CIA and stenosis at the transplant insertion site (at bifurcation and EIA) left alone. If symptoms persist, PTA right SFA Lower extremity arterial duplex 08/22/2015: Monophasic waveforms evident in the right external iliac and common/for left femoral artery suggest severe diffuse plaque, rate mid to distal SFA appears to be nearly occluded with dampened monophasic waveform distally in the popliteal artery and below the knee. Monophasic waveforms in the AT/PT. ABI could not be calculated due to noncompressible vessels. Left external and common femoral artery appears to show greater than 50% stenosis. Severe dampened monophasic waveforms throughout the left SFA. Left AT dampened monophasic form and PTT appears occluded. ABI could not be calculated due to noncompressible vessels. Abnormal Dopplers consider further workup. Essential hypertension, benign (I10) Bilateral carotid bruits (R09.89) Carotid duplex 08/20/12: 1. No evidence of hemodynamically significant stenosis in the bilateral carotid bifurcation vessels. There is evidence of homogeneous plaque in the right carotid artery. There is evidence of homogeneous plaque in the left carotid artery. Mild right intimal thickening. Mild left intimal thickening. 2. Compared to the study 01/30/12: Left ICA stenosis not evident. Further studies if clinically indicated only. Uncontrolled type 2 diabetes mellitus with chronic kidney disease, without long-term current use of insulin, unspecified CKD stage (E11.22, E11.65) End stage renal disease (585.6) Anemia in chronic kidney disease (285.21) Hypokalemia (E87.6) Immunosuppression (D89.9) Hypomagnesemia (E83.42) Dehydration (E86.0) Acidosis, metabolic (U31.4) Leukopenia  (D72.819) CMV (cytomegalovirus) (B25.9) Kidney transplant recipient (Z94.0)01/23/2013 Done at [facility] on 01/23/2013. Follows Elmarie Shiley, MD locally. [employer] study: Renal duplex 09/07/2013: Patent transplant vasculature, resistive indicis are increasing. The renal artery velocity at the anastomosis has increased from prior study, However this may indicate elevated velocity in the proximal iliac artery rather than presence of true stenosis.  Allergies ([name]; 09/27/2015 2:13 PM) No Known Drug Allergies01/31/2014  Family History [name]; 09/27/2015 2:25 PM) Mother is 39 yrs. old-Hypertension. Brother died at age 32 from CHF with complications from diabetes. Father living; h/o HTN, had triple by-pass  Social History [name]; 09/27/2015 2:13 PM) No smoking. No alcohol. Marital status Single. Living Situation Lives with parents. Number of Children 0.  Past Surgical History [name]; 09/27/2015 2:13 PM) 09/29/11 right arm AV fistula Dr. Scot Dock.  Failed left April 19th 2013 Transplantation; Renal08/03/2013 Right.  Medication History ([name]; 09/27/2015 2:25 PM) Cilostazol (100MG Tablet, 1 (one) Tablet Tablet Oral two times daily, Taken starting 08/02/2015) Active. Protonix (40MG Tablet DR, 1 Oral at bedtime) Active. Pravastatin Sodium (20MG Tablet, 1 Oral at bedtime) Active. AmLODIPine Besylate (10MG Tablet, 1 Oral at bedtime) Active. Labetalol HCl (300MG Tablet, 1 Oral daily) Active. PredniSONE (Pak) (5MG Tablet, 1 Oral daily) Active. Myfortic (180MG Tablet DR, 3 Oral two times daily) Active. Bactrim (400-80MG Tablet, 1 Oral M,W,Fri) Active. NovoLOG FlexPen (100UNIT/ML Soln Pen-inj, sliding scale Subcutaneous) Active. Levemir FlexTouch (100UNIT/ML Soln Pen-inj, 14 U in the AM, 22 U in the PM Subcutaneous) Active. Prograf (1MG Capsule, 2 in the AM Oral 2 in the PM) Active. Aspirin EC  (325MG Tablet DR, 1 Oral daily) Active. Albuterol Sulfate HFA (108 (90 Base)MCG/ACT Aerosol Soln, 1 to 2 puffs Inhalation as needed) Active. Furosemide (20MG Tablet, 1 Oral as needed) Active. Medications Reconciled (verbally with pt/ list present)  Diagnostic Studies History [name]; 09/27/2015 2:24 PM) Carotid duplex 01/29/12: Left ICA 50-69% stenosis. Right no stenosis (was noted prev). Recheck 6 mont Echocardiogram done on 03/30/2011 reveals normal LV systolic function. Mild diastolic dysfunction. Trivial pericardial effusion.LVH.  ECG 06/28/11: NSR @ 76/min. Normal intervals. non specific ST inf leads. no ischemia.  Renal duplex 07/29/11: Normal renal duplex scan. Lower Extremity Dopplers Lower extremity arterial duplex 11/01/2013: 1, Monophasic waveform in the iliac and proximal femoral artery suggests diffuse disease < 50% or proximal high grade stenosis (common iliac). Moderate velocity increase at right SFA-mid suggests >70% stenosis. 2. Monophasic waveforms in the mid to distal SFA may suggest proximal occlusion and filling by collaterals. No focal hemodynamically significant stenosis is identified in the left lower extremity arterial system. 3. ABI could not be obtained due to non-compressible vessels. Clinical correlation recommended. Labwork Labs 09/14/2015: BUN 15, serum creatinine 1.14, eGFR 67 mL. Serum glucose 171 mg, potassium 4.7. CMP otherwise normal. Pro time normal at 10.1 seconds. Hemoglobin 9.9/hematocrit 29, microcytic indicis. 05/04/2015: Serum glucose 198, creatinine 0.95, potassium 4.7, WBC 1.6, HB 11.0 with microcytic indices, vitamin D 16 08/12/2014: Serum glucose 180, BUN 23, creatinine 0.88, eGFR 92, potassium 4.6, CMP otherwise normal, CBC normal HbA1c 12/20/2013: 8.4% 11/02/2013: Potassium 4.6, BUN 20, serum creatinine 0.85. 10/05/2013: HB 11.1/HCT 37.1. CMP revealed BUN of 25, serum creatinine 0.88. Electrolytes within normal limits. Endoscopy05/2015 Normal. at  [facility] PV Angiogram04/09/2015 Right CIA at the bifurcation 80%. with tandem 80% at the anastamosis of renal transplant stump. Right mid SFA 80%. 3 vessel r/o upto ankle then all vessels severly diseased and ankle is filled by faint collaterals. S/P 6x14 mm stent into right CIA and stenosis at the transplant  insertion site (at bifurcation and EIA) left alone. If symptoms persist, PTA right SFA  Other Problems [name]; 09/27/2015 2:13 PM) Dizziness and giddiness (R42) Now on Hemodialysis since 09/28/10 Hospitalized at [facility] from 10/12-10/18/12 for CHF, cardiomegaly, hypertension, diabetes, chronic kidney failure.     Review of Systems Joy Page MD; 09/27/2015 3:24 PM) General Not Present- Anorexia, Fatigue and Fever. Respiratory Not Present- Cough, Decreased Exercise Tolerance and Dyspnea. Cardiovascular Present- Claudications, Leg Cramps and Night Cramps. Not Present- Chest Pain, Edema, Orthopnea, Paroxysmal Nocturnal Dyspnea and Shortness of Breath. Gastrointestinal Not Present- Change in Bowel Habits, Constipation and Nausea. Neurological Not Present- Dizziness and Syncope. Endocrine Not Present- Appetite Changes, Cold Intolerance and Heat Intolerance. Hematology Not Present- Anemia, Petechiae and Prolonged Bleeding. All other systems negative  Vitals [name]; 09/27/2015 2:27 PM) 09/27/2015 2:22 PM Weight: 144 lb Height: 62in Weight was reported by patient. Body Surface Area: 1.66 m Body Mass Index: 26.34 kg/m  Pulse: 107 (Regular)  P.OX: 98% (Room air) BP: 150/60 (Sitting, Left Arm, Standard) Pt is unable to stand      Physical Exam Joy Page MD; 09/27/2015 3:19 PM) General Mental Status-Alert. General Appearance-Cooperative, Appears stated age, Not in acute distress. Orientation-Oriented X3. Build & Nutrition-Short statured and Wilmer.  Head and Neck Thyroid Gland Characteristics - no palpable nodules, no  palpable enlargement.  Chest and Lung Exam Palpation Tender - No chest wall tenderness. Auscultation Breath sounds - Clear.  Cardiovascular Inspection Jugular vein - Right - No Distention. Auscultation Heart Sounds - S1 WNL, S2 WNL and No gallop present. Murmurs & Other Heart Sounds: Murmur - Location - Aortic Area. Timing - Mid-systolic. Grade - II/VI. Note: I-II/VI.  Abdomen Palpation/Percussion Normal exam - Non Tender and No hepatosplenomegaly. Auscultation Normal exam - Bowel sounds normal. Abdominal bruit - Right Lower Quadrant(over transplant site).  Peripheral Vascular Lower Extremity Inspection - Left - No Pigmentation, No Varicose veins. Right - No Pigmentation, No Varicose veins. Auscultation - Right Groin Bruit Present. Palpation - Temperature - Right - Cool(right foot). Edema - Bilateral - No edema. Femoral pulse - Left - 2+ and Bruit. Right - Feeble. Popliteal pulse - Bilateral - Absent. Dorsalis pedis pulse - Bilateral - Absent. Posterior tibial pulse - Bilateral - Absent. Carotid arteries - Bilateral-Carotid bruit. Abdomen-No prominent abdominal aortic pulsation, No epigastric bruit.  Neurologic Motor-Grossly intact without any focal deficits.  Musculoskeletal Global Assessment Left Lower Extremity - normal range of motion without pain. Right Lower Extremity - normal range of motion without pain.    Assessment & Plan Joy Page MD; 09/27/2015 7:05 PM) Claudication, intermittent (I73.9) Story: Peripheral arteriogram 09/19/2015: Right CIA at the bifurcation 80%. with tandem 80% at the anastamosis of renal transplant stump. Right mid SFA 80%. 3 vessel r/o upto ankle then all vessels severly diseased and ankle is filled by faint collaterals. S/P 6x14 mm stent into right CIA and stenosis at the transplant insertion site (at bifurcation and EIA) left alone. If symptoms persist, PTA right SFA  Lower extremity arterial duplex 08/22/2015: Monophasic  waveforms evident in the right external iliac and common/for left femoral artery suggest severe diffuse plaque, rate mid to distal SFA appears to be nearly occluded with dampened monophasic waveform distally in the popliteal artery and below the knee. Monophasic waveforms in the AT/PT. ABI could not be calculated due to noncompressible vessels. Left external and common femoral artery appears to show greater than 50% stenosis. Severe dampened monophasic waveforms throughout the left SFA. Left  AT dampened monophasic form and PTT appears occluded. ABI could not be calculated due to noncompressible vessels. Abnormal Dopplers consider further workup. Current Plans Started Clopidogrel Bisulfate 75MG, 1 (one) Tablet daily, #90, 09/27/2015, No Refill. Future Plans 8/41/3244: METABOLIC PANEL, BASIC (01027) - one time 09/28/2015: CBC & PLATELETS (AUTO) (25366) - one time 09/28/2015: PT (PROTHROMBIN TIME) (44034) - one time Kidney transplant recipient (Z94.0) Story: Done at [facility] on 01/23/2013. Follows Elmarie Shiley, MD locally.  [employer] study: Renal duplex 09/07/2013: Patent transplant vasculature, resistive indicis are increasing. The renal artery velocity at the anastomosis has increased from prior study, However this may indicate elevated velocity in the proximal iliac artery rather than presence of true stenosis. Essential hypertension, benign (I10) Impression: EKG 08/02/2015: Sinus rhythm at a rate of 86 bpm, normal axis, normal intervals, nonspecific ST and T wave abnormality. No significant change from EKG on 07/29/2014. Current Plans Mechanism of underlying disease process and action of medications discussed with the patient. Patient has who right foot, severe pain, I suspect she has critical limb ischemia non-limb threatening. Main issue is that during diagnostic angiography, all other vessels stopped about the ankle with no runoff to her foot. She has residual right SFA  stenosis, I'll try to set her up for repeat angiography and see if I can proceed with angioplasty of the right SFA to see if we can save her foot from amputation. I'm very concerned that on a long run she has a very high risk for amputation as she has no native vessel runoff below the ankle. She has severe diabetic vasculopathy.  I have taken her off of work for now. I will also contact the transplant surgeon and discussed the scenario off iliac artery stenosis right at the insertion site of the transplanted kidney. Office visit after the angiography. She is advised to contact me if her right leg pain continues to be worse or if she notices loss of sensation or darkening of the skin.  I attempted to contact the transplant team to discuss, but her MD was out of town and I was unable to talk to anyone else and have left message. She will need to see them soon to discuss PAD and problems of inflow stenosis at the iliac artery to the transplanted kidney.  CC Dr. Elmarie Shiley. Renal transplant clinic, [facility].  METABOLIC PANEL, BASIC (74259) Addendum Note(Jagadeesh Carlynn Herald MD; 10/03/2015 7:07 PM) Labs 04/70/2017: Serum glucose 445 mg. BUN 16, serum creatinine 1.12, eGFR 68 mL. The calcium 4.2. Hemoglobin 9.0/hematocrit 27.0. Microcytic indicis. WBC markedly reduced at 2.5 with absolute neutrophil count reduced to 1.0. Platelet count 377. Pro time/INR normal.  Labs are stable for proceeding with peripheral arteriogram.  Signed by Joy Page, MD (09/27/2015 7:06 PM)  Add: I  have discussed with her nephrologist at [facility] and would not recommend PTCA at the renal artery insertion site.  Will proceed with PTA of the SFA as a last ditch effort to save her ankle to improve capillary filling. Patient at baseline has no filling on diagnostic angiogram at the level of the right ankle. Patient is aware of the risk of limb loss as there is already demarcation and dry gangrene has set in. She will probably  need Sims amputation if I am able to establish flow at the ankle, otherwise will need above ankle amputation

## 2015-10-05 NOTE — Progress Notes (Signed)
Pt arrived as direct admit.  Telemetry placed and CCMD notified.  Notified attending of Pt arrival.  Will cont to monitor.

## 2015-10-06 ENCOUNTER — Ambulatory Visit (HOSPITAL_COMMUNITY): Admission: RE | Admit: 2015-10-06 | Payer: Medicare Other | Source: Ambulatory Visit | Admitting: Cardiology

## 2015-10-06 ENCOUNTER — Encounter (HOSPITAL_COMMUNITY): Payer: Self-pay

## 2015-10-06 ENCOUNTER — Encounter (HOSPITAL_COMMUNITY)
Admission: RE | Disposition: A | Discharge status: Home Health | Payer: Self-pay | Source: Ambulatory Visit | Attending: Cardiology

## 2015-10-06 HISTORY — PX: PERIPHERAL VASCULAR CATHETERIZATION: SHX172C

## 2015-10-06 LAB — BASIC METABOLIC PANEL
Anion gap: 12 (ref 5–15)
BUN: 17 mg/dL (ref 6–20)
CO2: 22 mmol/L (ref 22–32)
Calcium: 9.2 mg/dL (ref 8.9–10.3)
Chloride: 104 mmol/L (ref 101–111)
Creatinine, Ser: 1.1 mg/dL — ABNORMAL HIGH (ref 0.44–1.00)
GFR calc Af Amer: >60 mL/min (ref >60)
GFR calc non Af Amer: 59 mL/min — ABNORMAL LOW (ref >60)
Glucose, Bld: 161 mg/dL — ABNORMAL HIGH (ref 65–99)
Potassium: 3.9 mmol/L (ref 3.5–5.1)
Sodium: 138 mmol/L (ref 135–145)

## 2015-10-06 LAB — GLUCOSE, CAPILLARY
Glucose-Capillary: 45 mg/dL — ABNORMAL LOW (ref 65–99)
Glucose-Capillary: 127 mg/dL — ABNORMAL HIGH (ref 65–99)
Glucose-Capillary: 86 mg/dL (ref 65–99)
Glucose-Capillary: 149 mg/dL — ABNORMAL HIGH (ref 65–99)
Glucose-Capillary: 177 mg/dL — ABNORMAL HIGH (ref 65–99)
Glucose-Capillary: 87 mg/dL (ref 65–99)
Glucose-Capillary: 116 mg/dL — ABNORMAL HIGH (ref 65–99)

## 2015-10-06 LAB — POCT ACTIVATED CLOTTING TIME
Activated Clotting Time: 327 seconds
Activated Clotting Time: 312 seconds
Activated Clotting Time: 353 seconds
Activated Clotting Time: 255 seconds
Activated Clotting Time: 229 seconds
Activated Clotting Time: 193 seconds

## 2015-10-06 LAB — APTT: aPTT: 59 seconds — ABNORMAL HIGH (ref 24–37)

## 2015-10-06 SURGERY — LOWER EXTREMITY ANGIOGRAPHY
Anesthesia: LOCAL

## 2015-10-06 MED ORDER — MIDAZOLAM HCL 2 MG/2ML IJ SOLN
INTRAMUSCULAR | Status: DC | PRN
  Administered 2015-10-06: 1 mg via INTRAVENOUS

## 2015-10-06 MED ORDER — LIDOCAINE HCL (PF) 1 % IJ SOLN
INTRAMUSCULAR | Status: DC | PRN
  Administered 2015-10-06: 09:00:00

## 2015-10-06 MED ORDER — IODIXANOL 320 MG/ML IV SOLN
INTRAVENOUS | Status: DC | PRN
  Administered 2015-10-06: 170 mL via INTRA_ARTERIAL

## 2015-10-06 MED ORDER — HEPARIN SODIUM (PORCINE) 1000 UNIT/ML IJ SOLN
INTRAMUSCULAR | Status: DC | PRN
  Administered 2015-10-06: 3000 [IU] via INTRAVENOUS
  Administered 2015-10-06: 7000 [IU] via INTRAVENOUS

## 2015-10-06 MED ORDER — DEXTROSE 50 % IV SOLN
25.0000 mL | Freq: Once | INTRAVENOUS | Status: AC
  Administered 2015-10-10: 25 mL via INTRAVENOUS
  Filled 2015-10-06: qty 50

## 2015-10-06 MED ORDER — CLOPIDOGREL BISULFATE 75 MG PO TABS
ORAL_TABLET | ORAL | Status: AC
Start: 2015-10-06 — End: 2015-10-06
  Filled 2015-10-06: qty 1

## 2015-10-06 MED ORDER — ARGATROBAN 50 MG/50ML IV SOLN
1.0000 ug/kg/min | INTRAVENOUS | Status: DC
  Administered 2015-10-06 – 2015-10-08 (×4): 1 ug/kg/min via INTRAVENOUS
  Administered 2015-10-08: 0.8 ug/kg/min via INTRAVENOUS
  Filled 2015-10-06 (×5): qty 50

## 2015-10-06 MED ORDER — FENTANYL CITRATE (PF) 100 MCG/2ML IJ SOLN
INTRAMUSCULAR | Status: DC | PRN
  Administered 2015-10-06: 50 ug via INTRAVENOUS

## 2015-10-06 MED ORDER — CLOPIDOGREL BISULFATE 75 MG PO TABS
75.0000 mg | ORAL_TABLET | Freq: Every day | ORAL | Status: DC
  Administered 2015-10-06 – 2015-10-11 (×6): 75 mg via ORAL
  Filled 2015-10-06 (×5): qty 1

## 2015-10-06 MED ORDER — NITROGLYCERIN 1 MG/10 ML FOR IR/CATH LAB
INTRA_ARTERIAL | Status: DC | PRN
  Administered 2015-10-06: 1000 ug via INTRA_ARTERIAL

## 2015-10-06 MED ORDER — SODIUM CHLORIDE 0.9 % IV SOLN
1.0000 mL/kg/h | INTRAVENOUS | Status: AC
  Administered 2015-10-06: 1 mL/kg/h via INTRAVENOUS

## 2015-10-06 MED ORDER — DEXTROSE 50 % IV SOLN
INTRAVENOUS | Status: AC
  Filled 2015-10-06: qty 50

## 2015-10-06 SURGICAL SUPPLY — 22 items
BALLN ANGIOSCULPT 5X100 (BALLOONS) ×3
BALLOON ANGIOSCULPT 5X100 (BALLOONS) IMPLANT
CATH CROSS OVER TEMPO 5F (CATHETERS) ×1 IMPLANT
CATH STRAIGHT 5FR 65CM (CATHETERS) ×1 IMPLANT
GUIDEWIRE ANGLED .035X150CM (WIRE) ×1 IMPLANT
KIT ENCORE 26 ADVANTAGE (KITS) ×1 IMPLANT
KIT MICROINTRODUCER STIFF 5F (SHEATH) ×1 IMPLANT
KIT PV (KITS) ×3 IMPLANT
PACK CARDIAC CATHETERIZATION (CUSTOM PROCEDURE TRAY) ×1 IMPLANT
SHEATH PINNACLE 5F 10CM (SHEATH) ×1 IMPLANT
SHEATH PINNACLE ST 6F 45CM (SHEATH) ×1 IMPLANT
STENT INNOVA 5X80X130 (Permanent Stent) ×1 IMPLANT
STENT INNOVA 7X60X130 (Permanent Stent) ×1 IMPLANT
STENT OMNILINK ELITE 6X29X80 (Permanent Stent) ×1 IMPLANT
STOPCOCK MORSE 400PSI 3WAY (MISCELLANEOUS) ×1 IMPLANT
TAPE RADIOPAQUE TURBO (MISCELLANEOUS) ×2 IMPLANT
TRANSDUCER W/STOPCOCK (MISCELLANEOUS) ×3 IMPLANT
TUBING CIL FLEX 10 FLL-RA (TUBING) ×1 IMPLANT
TUBING HIGH PRESSURE 120CM (CONNECTOR) ×1 IMPLANT
WIRE HITORQ VERSACORE ST 145CM (WIRE) ×1 IMPLANT
WIRE SPARTACORE .014X300CM (WIRE) ×1 IMPLANT
WIRE VERSACORE LOC 115CM (WIRE) ×1 IMPLANT

## 2015-10-06 NOTE — Progress Notes (Signed)
ACT 193 at 1400. Dr Einar Gip agrees sheath removal at 1430 is reasonable.

## 2015-10-06 NOTE — Progress Notes (Signed)
Site area: LFA Site Prior to Removal:  Level 0 Pressure Applied For:30 min Manual:  yes  Patient Status During Pull: stable  Post Pull Site:  Level 0 Post Pull Instructions Given:  yes Post Pull Pulses Present: palpable Dressing Applied: tegaderm  Bedrest begins @ 1500 till 1900 Comments:

## 2015-10-06 NOTE — Progress Notes (Signed)
Utilization review completed.  

## 2015-10-06 NOTE — Progress Notes (Signed)
ANTICOAGULATION CONSULT NOTE - Initial Consult  Pharmacy Consult for argatroban Indication: thromboembolic disease  No Known Allergies  Patient Measurements: Height: 5' (152.4 cm) Weight: 141 lb (63.957 kg) IBW/kg (Calculated) : 45.5  Vital Signs: Temp: 99.9 F (37.7 C) (04/21 1533) Temp Source: Oral (04/21 1533) BP: 164/73 mmHg (04/21 1533) Pulse Rate: 109 (04/21 1533)  Labs:  Recent Labs  10/05/15 1721 10/06/15 0224  HGB 9.7*  --   HCT 30.7*  --   PLT 417*  --   CREATININE 1.14* 1.10*    Estimated Creatinine Clearance: 53.4 mL/min (by C-G formula based on Cr of 1.1).   Medical History: Past Medical History  Diagnosis Date  . Retinopathy   . Metabolic bone disease   . Anemia   . CHF (congestive heart failure) ([place])   . History of blood transfusion     related to "kidney transplant"  . Hypertension     sees Dr. Carolin Guernsey  . High cholesterol   . ESRD (end stage renal disease) on dialysis ([place]) 09/28/2011-01/23/2013    M-W-F; [place]  . Pneumonia 09/27/2011  . Type II diabetes mellitus ([place])     "controlled with diet"  . Critical ischemia of lower extremity hospitalized 10/05/2015    right   Assessment: 45 year old female who presents with peripheral vascular disease. She is s/p renal transplantation due to diabetic and hypertensive renal disease on 01/23/2013 at [facility].  Presented with worsening leg pain, status post arteriogram intervention 2 with stenting for limb salvage intervention to the right foot. New orders to attempt to open capillaries up with direct thrombin inhibitor. Will adjust dose accordingly.  Goal of Therapy:  aPTT 50-90 seconds seconds Monitor platelets by anticoagulation protocol: Yes   Plan:  Start argatroban 66mcg/kg/min Check aptt every 2 hours until at goal x2  Erin Hearing PharmD., BCPS Clinical Pharmacist Pager [phone] 10/06/2015 7:15 PM

## 2015-10-06 NOTE — Progress Notes (Signed)
Inpatient Diabetes Program Recommendations  AACE/ADA: New Consensus Statement on Inpatient Glycemic Control (2015)  Target Ranges:  Prepandial:   less than 140 mg/dL      Peak postprandial:   less than 180 mg/dL (1-2 hours)      Critically ill patients:  140 - 180 mg/dL   Results for [name], [name] (MRN [id number]) as of 10/06/2015 10:33  Ref. Range 10/05/2015 21:56 10/06/2015 06:20 10/06/2015 06:46 10/06/2015 09:57  Glucose-Capillary Latest Ref Range: 65-99 mg/dL 174 (H) 45 (L) 127 (H) 86   Review of Glycemic Control  Diabetes history: DM 2 Outpatient Diabetes medications: Novolog 4-14 units TID, Levemir 16 units QAM, 22 units QPM Current orders for Inpatient glycemic control: Levemir 16 units QAM, 22 units QPM, Novolog Resistant + Novolog 3 units TID meal coverage   Inpatient Diabetes Program Recommendations: Insulin - Basal: Patient had hypoglycemia this am of 45 mg/dl at 0620 am on home dose of Levemir. Please reduce HS dose of Levemir from 22 to Levemir 16 units BID. Correction (SSI): Patient takes Novolog 4-14 units TID at home. Please reduce correction to Novolog Moderate (0-15 units) TID + HS.  Thanks,  Tama Headings RN, MSN, [facility] Inpatient Diabetes Coordinator Team Pager [phone] (8a-5p)

## 2015-10-06 NOTE — Consult Note (Signed)
ORTHOPAEDIC CONSULTATION  REQUESTING PHYSICIAN: Adrian Prows, MD  Chief Complaint: Painful ischemic changes right foot.  HPI: [name] is a 45 y.o. female who presents with dusky cold right foot. Patient is status post arteriogram intervention 2 with stenting for limb salvage intervention to the right foot.  Past Medical History  Diagnosis Date  . Retinopathy   . Metabolic bone disease   . Anemia   . CHF (congestive heart failure) ([place])   . History of blood transfusion     related to "kidney transplant"  . Hypertension     sees Dr. Carolin Guernsey  . High cholesterol   . ESRD (end stage renal disease) on dialysis ([place]) 09/28/2011-01/23/2013    M-W-F; [place]  . Pneumonia 09/27/2011  . Type II diabetes mellitus ([place])     "controlled with diet"  . Critical ischemia of lower extremity hospitalized 10/05/2015    right   Past Surgical History  Procedure Laterality Date  . Insertion of dialysis catheter  10/04/2011    Procedure: INSERTION OF DIALYSIS CATHETER;  Surgeon: Angelia Mould, MD;  Location: [place];  Service: Vascular;  Laterality: Right;  insertion of dialysis catheter right internal jugular  . Av fistula placement  10/04/2011    Procedure: INSERTION OF ARTERIOVENOUS (AV) GORE-TEX GRAFT ARM;  Surgeon: Angelia Mould, MD;  Location: [place];  Service: Vascular;  Laterality: Left;  Insertion left upper arm Arteriovenous goretex graft  . Avgg removal  10/04/2011    Procedure: REMOVAL OF ARTERIOVENOUS GORETEX GRAFT ([place]);  Surgeon: Elam Dutch, MD;  Location: [place];  Service: Vascular;  Laterality: Left;  . Av fistula placement  10/29/2011    Procedure: ARTERIOVENOUS (AV) FISTULA CREATION;  Surgeon: Angelia Mould, MD;  Location: [facility] OR;  Service: Vascular;  Laterality: Right;  Creation Right Arteriovenous Fistula   . Revison of arteriovenous fistula  06/23/2012    Procedure: REVISON OF ARTERIOVENOUS FISTULA;  Surgeon: Angelia Mould, MD;   Location: [place];  Service: Vascular;  Laterality: Right;  Ultrasound guided  . Insertion of dialysis catheter  06/23/2012    Procedure: INSERTION OF DIALYSIS CATHETER;  Surgeon: Angelia Mould, MD;  Location: [place];  Service: Vascular;  Laterality: N/A;  Ultrasound guided  . Patch angioplasty  06/23/2012    Procedure: PATCH ANGIOPLASTY;  Surgeon: Angelia Mould, MD;  Location[facility] OR;  Service: Vascular;  Laterality: Right;  . Esophagogastroduodenoscopy N/A 12/24/2012    Procedure: ESOPHAGOGASTRODUODENOSCOPY (EGD);  Surgeon: Milus Banister, MD;  Location: [place];  Service: Endoscopy;  Laterality: N/A;  . Kidney transplant  01/24/2013  . Finger amputation Right 02/26/2013    "3rd finger"  . Shuntogram N/A 04/06/2012    Procedure: Earney Mallet;  Surgeon: Angelia Mould, MD;  Location: [facility] CATH LAB;  Service: Cardiovascular;  Laterality: N/A;  . Peripheral vascular catheterization N/A 09/19/2015    Procedure: Lower Extremity Angiography;  Surgeon: Adrian Prows, MD;  Location: [place] [facility] LAB;  Service: Cardiovascular;  Laterality: N/A;  . Peripheral vascular catheterization N/A 09/19/2015    Procedure: Abdominal Aortogram;  Surgeon: Adrian Prows, MD;  Location: [place] [facility] LAB;  Service: Cardiovascular;  Laterality: N/A;  . Peripheral vascular catheterization Right 09/19/2015    Procedure: Peripheral Vascular Intervention;  Surgeon: Adrian Prows, MD;  Location: [place] [facility] LAB;  Service: Cardiovascular;  Laterality: Right;  Right Common  Iliac   Social History   Social History  . Marital Status: Single  Spouse Name: N/A  . Number of Children: 0  . Years of Education: N/A   Social History Main Topics  . Smoking status: Never Smoker   . Smokeless tobacco: Never Used  . Alcohol Use: No  . Drug Use: No  . Sexual Activity: No   Other Topics Concern  . None   Social History Narrative   Family History  Problem Relation Age of Onset  . Malignant hyperthermia Mother   .  Malignant hyperthermia Father   . Anesthesia problems Neg Hx   . Thyroid disease Mother   . Diabetes Brother   . Heart disease Brother     CHF  . Hypertension Mother   . Hypertension Father    - negative except otherwise stated in the family history section No Known Allergies Prior to Admission medications   Medication Sig Start Date End Date Taking? Authorizing Provider  amLODipine (NORVASC) 10 MG tablet Take 10 mg by mouth at bedtime.  11/14/11  Yes Historical Provider, MD  aspirin EC 325 MG EC tablet Take 1 tablet (325 mg total) by mouth daily. 09/20/15  Yes Neldon Labella, NP  cilostazol (PLETAL) 100 MG tablet Take 100 mg by mouth 2 (two) times daily.   Yes Historical Provider, MD  insulin aspart (NOVOLOG) 100 UNIT/ML injection Inject 4-14 Units into the skin 3 (three) times daily before meals. Per sliding scale: 100-150, 4 units. 151-200, 6 units. 201-250, 8 units. 251-300, 10 units. 301-350, 12 units. Over 350, 14 units.   Yes Historical Provider, MD  insulin detemir (LEVEMIR) 100 UNIT/ML injection Inject 16-22 Units into the skin See admin instructions. 16 units in the morning and 22 units at bedtime   Yes Historical Provider, MD  labetalol (NORMODYNE) 300 MG tablet Take 300 mg by mouth daily.    Yes Historical Provider, MD  mycophenolate (MYFORTIC) 180 MG EC tablet Take 360-540 mg by mouth 2 (two) times daily. 540 mg in the morning and 360 mg at night   Yes Historical Provider, MD  oxyCODONE-acetaminophen (PERCOCET/ROXICET) 5-325 MG tablet Take 1-2 tablets by mouth every 4 (four) hours as needed for severe pain.   Yes Historical Provider, MD  pantoprazole (PROTONIX) 40 MG tablet Take 40 mg by mouth daily.   Yes Historical Provider, MD  pravastatin (PRAVACHOL) 20 MG tablet Take 20 mg by mouth at bedtime.  09/11/12  Yes Historical Provider, MD  sulfamethoxazole-trimethoprim (BACTRIM,SEPTRA) 400-80 MG per tablet Take 1 tablet by mouth every Monday, Wednesday, and Friday.    Yes Amber  Reeves-Daniel, DO  tacrolimus (PROGRAF) 0.5 MG capsule Take 0.5 mg by mouth 2 (two) times daily.   Yes Historical Provider, MD  tacrolimus (PROGRAF) 1 MG capsule Take 1.5 mg by mouth 2 (two) times daily.    Yes Historical Provider, MD  Vitamin D, Ergocalciferol, (DRISDOL) 50000 units CAPS capsule Take 50,000 Units by mouth every 7 (seven) days. Sundays   Yes Historical Provider, MD  albuterol (PROVENTIL HFA;VENTOLIN HFA) 108 (90 BASE) MCG/ACT inhaler Inhale 1 puff into the lungs every 6 (six) hours as needed for wheezing or shortness of breath.    Historical Provider, MD   No results found. - pertinent xrays, CT, MRI studies were reviewed and independently interpreted  Positive ROS: All other systems have been reviewed and were otherwise negative with the exception of those mentioned in the HPI and as above.  Physical Exam: General: Alert, no acute distress Cardiovascular: No pedal edema Respiratory: No cyanosis, no use of accessory musculature GI: No  organomegaly, abdomen is soft and non-tender Skin: Circumferentially demarcated ischemic changes to the entire forefoot and midfoot with the plantar aspect extending back to the heel pad with cold painful ischemic changes and a mummified great toe. Neurologic: Patient does not have protective sensation. Psychiatric: Patient is competent for consent with normal mood and affect Lymphatic: No axillary or cervical lymphadenopathy  MUSCULOSKELETAL:  On examination I cannot palpate a dorsalis pedis or posterior tibial pulse. With Doppler patient has a very faint dopplerable dorsalis pedis pulse. With Doppler the posterior tibial pulse is extremely faint and monophasic.  Assessment: Assessment: Critical limb ischemia status post limb salvage intervention with arteriogram and stenting 2 with a well demarcated cold ischemic midfoot and forefoot.  Plan: Plan: Patient does not have any foot salvage options at this time. Patient's changes are dry and  gangrenous and do not feel that urgent intervention is necessary. I have discussed the case with Dr. Nadyne Coombes and I will follow up on Sunday morning. Patient most likely will require a transtibial amputation.  Thank you for the consult and the opportunity to see Ms. [place], [place] [phone] 6:41 PM

## 2015-10-06 NOTE — Progress Notes (Deleted)
Site area: LFA Site Prior to Removal:  Level 0 Pressure Applied For:30 min Manual:   yes Patient Status During Pull:  stable Post Pull Site:  Level 0 Post Pull Instructions Given:  yes Post Pull Pulses Present: palpable Dressing Applied:  tegaderm Bedrest begins @ 1500 till 1900 Comments:

## 2015-10-06 NOTE — Progress Notes (Signed)
D/W Dr. Sharol Given. Will start argatroban to see if the capillaries would open up. Unusual that she had PTA on 4th and then developed CLI almost 2 weeks later with aggressive thrombus formation and progression to dry gangrene.   If perfusion improves and pain is controlled, can wait to see limited amputation can be done, otherwise BKA.   Appreciate his input.

## 2015-10-06 NOTE — Progress Notes (Deleted)
Site area: LFA Site Prior to Removal:  Level 0 Pressure Applied For:30 min Manual:  yes  Patient Status During Pull:  stable Post Pull Site:  Level 0 Post Pull Instructions Given:  yes Post Pull Pulses Present: palpable Dressing Applied:  tegaderm Bedrest begins @ 1500 till 1900 Comments:

## 2015-10-06 NOTE — Interval H&P Note (Signed)
History and Physical Interval Note:  10/06/2015 7:40 AM  [name]  has presented today for surgery, with the diagnosis of pvd  The various methods of treatment have been discussed with the patient and family. After consideration of risks, benefits and other options for treatment, the patient has consented to  Procedure(s): Lower Extremity Angiography (N/A) and angioplasty as a surgical intervention .  The patient's history has been reviewed, patient examined, no change in status, stable for surgery.  I have reviewed the patient's chart and labs.  Questions were answered to the patient's satisfaction.     Adrian Prows

## 2015-10-07 ENCOUNTER — Inpatient Hospital Stay (HOSPITAL_COMMUNITY): Payer: Medicare Other

## 2015-10-07 DIAGNOSIS — I998 Other disorder of circulatory system: Secondary | ICD-10-CM

## 2015-10-07 LAB — BASIC METABOLIC PANEL
Anion gap: 13 (ref 5–15)
BUN: 14 mg/dL (ref 6–20)
CO2: 19 mmol/L — ABNORMAL LOW (ref 22–32)
Calcium: 8.9 mg/dL (ref 8.9–10.3)
Chloride: 106 mmol/L (ref 101–111)
Creatinine, Ser: 1.35 mg/dL — ABNORMAL HIGH (ref 0.44–1.00)
GFR calc Af Amer: 54 mL/min — ABNORMAL LOW (ref >60)
GFR calc non Af Amer: 46 mL/min — ABNORMAL LOW (ref >60)
Glucose, Bld: 164 mg/dL — ABNORMAL HIGH (ref 65–99)
Potassium: 3.9 mmol/L (ref 3.5–5.1)
Sodium: 138 mmol/L (ref 135–145)

## 2015-10-07 LAB — CBC
HCT: 26.8 % — ABNORMAL LOW (ref 36.0–46.0)
Hemoglobin: 8.1 g/dL — ABNORMAL LOW (ref 12.0–15.0)
MCH: 23.2 pg — ABNORMAL LOW (ref 26.0–34.0)
MCHC: 30.2 g/dL (ref 30.0–36.0)
MCV: 76.8 fL — ABNORMAL LOW (ref 78.0–100.0)
Platelets: 380 10*3/uL (ref 150–400)
RBC: 3.49 MIL/uL — ABNORMAL LOW (ref 3.87–5.11)
RDW: 14.4 % (ref 11.5–15.5)
WBC: 3 10*3/uL — ABNORMAL LOW (ref 4.0–10.5)

## 2015-10-07 LAB — GLUCOSE, CAPILLARY
Glucose-Capillary: 164 mg/dL — ABNORMAL HIGH (ref 65–99)
Glucose-Capillary: 128 mg/dL — ABNORMAL HIGH (ref 65–99)
Glucose-Capillary: 60 mg/dL — ABNORMAL LOW (ref 65–99)
Glucose-Capillary: 89 mg/dL (ref 65–99)
Glucose-Capillary: 117 mg/dL — ABNORMAL HIGH (ref 65–99)

## 2015-10-07 LAB — APTT: aPTT: 60 seconds — ABNORMAL HIGH (ref 24–37)

## 2015-10-07 MED ORDER — ASPIRIN EC 81 MG PO TBEC
81.0000 mg | DELAYED_RELEASE_TABLET | Freq: Every day | ORAL | Status: DC
  Administered 2015-10-08 – 2015-10-11 (×4): 81 mg via ORAL
  Filled 2015-10-07 (×4): qty 1

## 2015-10-07 NOTE — Progress Notes (Signed)
Subjective:  Patient states that her right leg pain is significantly improved. Still needs pain medication but states that overall she feels well.  Objective:  Vital Signs in the last 24 hours: Temp:  [98.6 F (37 C)-102 F (38.9 C)] 98.6 F (37 C) (04/22 0524) Pulse Rate:  [101-117] 111 (04/22 0524) Resp:  [11-18] 16 (04/22 0524) BP: (135-171)/(56-92) 163/60 mmHg (04/22 0524) SpO2:  [90 %-100 %] 100 % (04/22 0524)  Intake/Output from previous day: 04/21 0701 - 04/22 0700 In: 120 [P.O.:120] Out: -   Physical Exam:   General appearance: alert, cooperative, appears stated age and no distress Eyes: negative findings: lids and lashes normal Neck: no adenopathy, no carotid bruit, no JVD, supple, symmetrical, trachea midline and thyroid not enlarged, symmetric, no tenderness/mass/nodules Neck: JVP - normal, carotids 2+= without bruits Resp: clear to auscultation bilaterally Chest wall: no tenderness Cardio: S1 and S2 is normal, there is a 1 to 2/6 systolic ejection murmur at the right sternal border. No gallop. GI: soft, non-tender; bowel sounds normal; no masses,  no organomegaly Extremities: right lower extremity,right foot, is gangrenous, dry gangrene. There is clear-cut demarcation at the level of the midfoot. The right leg itself is warm and nontender to touch. Very minimal edema is present. Other extra duties are normal. Evidence of chronic arterial insufficiency is evident in the left leg with shiny skin and absent hair.  Arterial exam: Left femoral arterial access has healed well.2+ bilateral femoral pulses without bruit.Popliteal pulsenot felt bilaterally, absent pedal pulses. Carotids normal.    Lab Results: BMP  Recent Labs  10/05/15 1721 10/06/15 0224 10/07/15 0209  NA 137 138 138  K 4.1 3.9 3.9  CL 100* 104 106  CO2 20* 22 19*  GLUCOSE 185* 161* 164*  BUN 19 17 14   CREATININE 1.14* 1.10* 1.35*  CALCIUM 9.7 9.2 8.9  GFRNONAA 57* 59* 46*  GFRAA >60 >60 54*     CBC  Recent Labs Lab 10/07/15 0209  WBC 3.0*  RBC 3.49*  HGB 8.1*  HCT 26.8*  PLT 380  MCV 76.8*  MCH 23.2*  MCHC 30.2  RDW 14.4    HEMOGLOBIN A1C Lab Results  Component Value Date   HGBA1C 6.3* 12/20/2012   MPG 134* 12/20/2012   Recent Labs  08/28/15 1348  PROT 6.3*  ALBUMIN 3.7  AST 21  ALT 9*  ALKPHOS 247*  BILITOT 0.5   Cardiac Studies: ABI findings from 10/07/2015 noted. There is clearly presence of blood flow through the lower extremity, comparable very closely to the left lower extremity  Scheduled Meds: . amLODipine  10 mg Oral QHS  . [START ON 10/08/2015] aspirin EC  81 mg Oral Daily  . cilostazol  100 mg Oral BID  . clopidogrel  75 mg Oral Daily  . dextrose  25 mL Intravenous Once  . insulin aspart  0-20 Units Subcutaneous TID WC  . insulin aspart  3 Units Subcutaneous TID WC  . insulin detemir  16 Units Subcutaneous Daily   And  . insulin detemir  22 Units Subcutaneous QHS  . labetalol  300 mg Oral Daily  . mycophenolate  540 mg Oral Daily   And  . mycophenolate  360 mg Oral QHS  . pantoprazole  40 mg Oral Daily  . pravastatin  20 mg Oral QHS  . sodium chloride flush  3 mL Intravenous Q12H  . sulfamethoxazole-trimethoprim  1 tablet Oral Q M,W,F  . tacrolimus  1.5 mg Oral BID   Continuous  Infusions: . argatroban 1 mcg/kg/min (10/07/15 0539)   PRN Meds:.acetaminophen, albuterol, ondansetron (ZOFRAN) IV, oxyCODONE-acetaminophen, sodium chloride flush   Assessment/Plan:  1. Critical limb ischemia, dry gangrene right foot. Much improved perfusionand clear-cut demarcation of the skin at the level of the mid foot. 2.Diabetes mellitus controlled on insulin. 3. Acute on chronic renal insufficiency.Probably related to metabolic issues and probable contrast exposure. Patient not on any nephrotoxic agent. 4. Status post renal transplant status.  Recommendation: appreciate Dr. Sharol Given seeing the patient and give me input. Appreciate the help from  pharmacist. Continue Argatroban, for the next 24 hours. Pain is much improved, patient advised to see if she considered upon the chair. If pain control is better and she is able to tolerate,probably wait  On the amputation status until clear-cut demarcation sets in. Fortunately there is no evidence of wound, there is no dry gangrene, hence I expect that she may have a better outcome. Hopefully she will need only minor amputation. Follow-up on serum creatinine.  Adrian Prows, M.D. 10/07/2015, 10:40 AM [facility] Pager: [phone] Office: [phone] If no answer: [phone]

## 2015-10-07 NOTE — Progress Notes (Addendum)
ANTICOAGULATION CONSULT NOTE - Follow Up Consult  Pharmacy Consult for argatroban Indication: thromboembolic disease  Labs:  Recent Labs  10/05/15 1721 10/06/15 0224 10/06/15 2300 10/07/15 0209  HGB 9.7*  --   --  8.1*  HCT 30.7*  --   --  26.8*  PLT 417*  --   --  380  APTT  --   --  59* 60*  CREATININE 1.14* 1.10*  --  1.35*     Assessment/Plan:  45yo female therapeutic on argatroban with initial dosing. Will continue gtt at current rate and confirm stable with additional PTT.   Wynona Neat, PharmD, BCPS  10/07/2015,12:37 AM   ADDENDUM: Repeat aPTT 60, remains within goal.  Will continue to monitor. VB  3:02 AM

## 2015-10-07 NOTE — Progress Notes (Addendum)
VASCULAR LAB PRELIMINARY  ARTERIAL  ABI completed:  RIGHT: ATA pressure non-compressible, most likley due to calcified vessels. PTA and Peroneal pressures most likely falsely elevated. Waveforms suggest severe arterial disease. Right toe waveform absent, could not obtain pressure.  LEFT: ATA and peroneal pressure non-compressible, most likely due to calcified vessels. Could not obtain PTA waveform. Waveforms suggest moderate arterial disease. Left TBI abnormal.    RIGHT    LEFT    PRESSURE WAVEFORM  PRESSURE WAVEFORM  BRACHIAL N/A - restricted arm atypical BRACHIAL 183 Tri  DP   DP    AT 239 Damp mono AT 252 Mono   PT 92 Severe damp mono PT Could not obtain   PER 110 Damp mono PER 254 Mono   GREAT TOE ABSENT NA GREAT TOE 15 NA    RIGHT LEFT  ABI Non- compressible/ falsely elevated  Non- compressible    Called Dr. Einar Gip with results.    Landry Mellow, RDMS, RVT  10/07/2015, 8:53 AM

## 2015-10-07 NOTE — Progress Notes (Signed)
Hypoglycemic Event  CBG: 60  Treatment: 4 oz. Orange juice  Symptoms: asymptomatic  Follow-up CBG: Time:1630 CBG Result:89  Possible Reasons for Event: Patient vomiting and diarrhea, not eating much  Comments/MD notified:    Ellyse Rotolo L

## 2015-10-08 LAB — GLUCOSE, CAPILLARY
Glucose-Capillary: 68 mg/dL (ref 65–99)
Glucose-Capillary: 90 mg/dL (ref 65–99)
Glucose-Capillary: 181 mg/dL — ABNORMAL HIGH (ref 65–99)
Glucose-Capillary: 95 mg/dL (ref 65–99)

## 2015-10-08 LAB — BASIC METABOLIC PANEL
Anion gap: 14 (ref 5–15)
BUN: 14 mg/dL (ref 6–20)
CO2: 19 mmol/L — ABNORMAL LOW (ref 22–32)
Calcium: 9.2 mg/dL (ref 8.9–10.3)
Chloride: 107 mmol/L (ref 101–111)
Creatinine, Ser: 1.45 mg/dL — ABNORMAL HIGH (ref 0.44–1.00)
GFR calc Af Amer: 49 mL/min — ABNORMAL LOW (ref >60)
GFR calc non Af Amer: 42 mL/min — ABNORMAL LOW (ref >60)
Glucose, Bld: 55 mg/dL — ABNORMAL LOW (ref 65–99)
Potassium: 3.7 mmol/L (ref 3.5–5.1)
Sodium: 140 mmol/L (ref 135–145)

## 2015-10-08 LAB — CBC
HCT: 27 % — ABNORMAL LOW (ref 36.0–46.0)
Hemoglobin: 8.4 g/dL — ABNORMAL LOW (ref 12.0–15.0)
MCH: 23.7 pg — ABNORMAL LOW (ref 26.0–34.0)
MCHC: 31.1 g/dL (ref 30.0–36.0)
MCV: 76.3 fL — ABNORMAL LOW (ref 78.0–100.0)
Platelets: 382 10*3/uL (ref 150–400)
RBC: 3.54 MIL/uL — ABNORMAL LOW (ref 3.87–5.11)
RDW: 14.5 % (ref 11.5–15.5)
WBC: 3.2 10*3/uL — ABNORMAL LOW (ref 4.0–10.5)

## 2015-10-08 LAB — APTT
aPTT: 100 seconds — ABNORMAL HIGH (ref 24–37)
aPTT: 77 seconds — ABNORMAL HIGH (ref 24–37)
aPTT: 43 seconds — ABNORMAL HIGH (ref 24–37)
aPTT: 66 seconds — ABNORMAL HIGH (ref 24–37)
aPTT: 73 seconds — ABNORMAL HIGH (ref 24–37)

## 2015-10-08 LAB — CREATININE, URINE, RANDOM: Creatinine, Urine: 215.93 mg/dL

## 2015-10-08 LAB — SODIUM, URINE, RANDOM: Sodium, Ur: <10 mmol/L

## 2015-10-08 MED ORDER — NITROGLYCERIN 0.2 MG/HR TD PT24
0.2000 mg | MEDICATED_PATCH | Freq: Every day | TRANSDERMAL | Status: DC
  Administered 2015-10-08 – 2015-10-10 (×3): 0.2 mg via TRANSDERMAL
  Filled 2015-10-08 (×4): qty 1

## 2015-10-08 MED ORDER — ARGATROBAN 50 MG/50ML IV SOLN
1.3000 ug/kg/min | INTRAVENOUS | Status: DC
  Administered 2015-10-08: 1 ug/kg/min via INTRAVENOUS
  Filled 2015-10-08 (×2): qty 50

## 2015-10-08 MED ORDER — SODIUM CHLORIDE 0.9 % IV SOLN
INTRAVENOUS | Status: DC
  Administered 2015-10-08: 1 mL via INTRAVENOUS

## 2015-10-08 NOTE — Progress Notes (Signed)
Subjective:  Patient states that her right leg pain is significantly improved. Pain significantly better. Sitting up in chair.  Objective:  Vital Signs in the last 24 hours: Temp:  [98.3 F (36.8 C)-98.4 F (36.9 C)] 98.4 F (36.9 C) (04/23 1323) Pulse Rate:  [92-107] 92 (04/23 1323) Resp:  [17-18] 17 (04/23 1323) BP: (119-152)/(59-62) 119/59 mmHg (04/23 1323) SpO2:  [96 %-100 %] 100 % (04/23 1323)  Intake/Output from previous day: 04/22 0701 - 04/23 0700 In: 480 [P.O.:480] Out: 600 [Urine:600]  Physical Exam:   General appearance: alert, cooperative, appears stated age and no distress Eyes: negative findings: lids and lashes normal Neck: no adenopathy, no carotid bruit, no JVD, supple, symmetrical, trachea midline and thyroid not enlarged, symmetric, no tenderness/mass/nodules Neck: JVP - normal, carotids 2+= without bruits Resp: clear to auscultation bilaterally Chest wall: no tenderness Cardio: S1 and S2 is normal, there is a 1 to 2/6 systolic ejection murmur at the right sternal border. No gallop. GI: soft, non-tender; bowel sounds normal; no masses,  no organomegaly Extremities: right lower extremity,right foot, is gangrenous, dry gangrene. There is clear-cut demarcation at the level of the midfoot. The right leg itself is warm and nontender to touch. Very minimal edema is present. Other extra duties are normal. Evidence of chronic arterial insufficiency is evident in the left leg with shiny skin and absent hair.  Arterial exam: Left femoral arterial access has healed well.2+ bilateral femoral pulses without bruit.Popliteal pulsenot felt bilaterally, absent pedal pulses. Carotids normal.    Lab Results: BMP  Recent Labs  10/06/15 0224 10/07/15 0209 10/08/15 0440  NA 138 138 140  K 3.9 3.9 3.7  CL 104 106 107  CO2 22 19* 19*  GLUCOSE 161* 164* 55*  BUN 17 14 14   CREATININE 1.10* 1.35* 1.45*  CALCIUM 9.2 8.9 9.2  GFRNONAA 59* 46* 42*  GFRAA >60 54* 49*     CBC  Recent Labs Lab 10/08/15 0440  WBC 3.2*  RBC 3.54*  HGB 8.4*  HCT 27.0*  PLT 382  MCV 76.3*  MCH 23.7*  MCHC 31.1  RDW 14.5    HEMOGLOBIN A1C Lab Results  Component Value Date   HGBA1C 6.3* 12/20/2012   MPG 134* 12/20/2012    Recent Labs  08/28/15 1348  PROT 6.3*  ALBUMIN 3.7  AST 21  ALT 9*  ALKPHOS 247*  BILITOT 0.5   Cardiac Studies: ABI findings from 10/07/2015 noted. There is clearly presence of blood flow through the lower extremity, comparable very closely to the left lower extremity  Scheduled Meds: . amLODipine  10 mg Oral QHS  . aspirin EC  81 mg Oral Daily  . cilostazol  100 mg Oral BID  . clopidogrel  75 mg Oral Daily  . dextrose  25 mL Intravenous Once  . insulin aspart  0-20 Units Subcutaneous TID WC  . insulin aspart  3 Units Subcutaneous TID WC  . insulin detemir  16 Units Subcutaneous Daily   And  . insulin detemir  22 Units Subcutaneous QHS  . labetalol  300 mg Oral Daily  . mycophenolate  540 mg Oral Daily   And  . mycophenolate  360 mg Oral QHS  . nitroGLYCERIN  0.2 mg Transdermal Daily  . pantoprazole  40 mg Oral Daily  . pravastatin  20 mg Oral QHS  . sodium chloride flush  3 mL Intravenous Q12H  . sulfamethoxazole-trimethoprim  1 tablet Oral Q M,W,F  . tacrolimus  1.5 mg Oral BID  Continuous Infusions: . sodium chloride 1 mL (10/08/15 1427)   PRN Meds:.acetaminophen, albuterol, ondansetron (ZOFRAN) IV, oxyCODONE-acetaminophen, sodium chloride flush   Assessment/Plan:  1. Critical limb ischemia, dry gangrene right foot. Much improved perfusionand clear-cut demarcation of the skin at the level of the mid foot. 2.Diabetes mellitus controlled on insulin. 3. Acute on chronic renal insufficiency.Probably related to metabolic issues and probable contrast exposure. Patient not on any nephrotoxic agent. Nephrology consulted. 4. Status post renal transplant status.  Recommendation: Again appreciate Dr. Sharol Given seeing the  patient and possible minor amputation (transmetatarsal) possibly Friday (IP vs OP).  I had discontinued Argatroban this morning around 12:30 in the afternoon, but see Dr. Sharol Given prefers to continue  for the next 24 hours or so and hence will resume. Appreciated Dr. Roney Jaffe (nephro) seeing the patient. Personally discussed.     Adrian Prows, M.D. 10/08/2015, 6:25 PM [facility] Pager: [phone] Office: [phone] If no answer: [phone]

## 2015-10-08 NOTE — Progress Notes (Addendum)
ANTICOAGULATION CONSULT NOTE - Initial Consult  Pharmacy Consult for argatroban Indication: thromboembolic disease  No Known Allergies  Patient Measurements: Height: 5' (152.4 cm) Weight: 141 lb (63.957 kg) IBW/kg (Calculated) : 45.5  Vital Signs: Temp: 98.4 F (36.9 C) (04/23 1323) Temp Source: Oral (04/23 1323) BP: 119/59 mmHg (04/23 1323) Pulse Rate: 92 (04/23 1323)  Labs:  Recent Labs  10/06/15 0224  10/07/15 0209 10/08/15 0440 10/08/15 0921 10/08/15 1319  HGB  --   --  8.1* 8.4*  --   --   HCT  --   --  26.8* 27.0*  --   --   PLT  --   --  380 382  --   --   APTT  --   < > 60* 100* 77* 43*  CREATININE 1.10*  --  1.35* 1.45*  --   --   < > = values in this interval not displayed.  Estimated Creatinine Clearance: 40.5 mL/min (by C-G formula based on Cr of 1.45).   Medical History: Past Medical History  Diagnosis Date  . Retinopathy   . Metabolic bone disease   . Anemia   . CHF (congestive heart failure) ([place])   . History of blood transfusion     related to "kidney transplant"  . Hypertension     sees Dr. Carolin Guernsey  . High cholesterol   . ESRD (end stage renal disease) on dialysis ([place]) 09/28/2011-01/23/2013    M-W-F; [place]  . Pneumonia 09/27/2011  . Type II diabetes mellitus ([place])     "controlled with diet"  . Critical ischemia of lower extremity hospitalized 10/05/2015    right   Assessment: 45 year old female who presents with PVD and worsening leg pain s/p arteriogram intervention 2 with stenting for limb salvage intervention to the right foot. She is s/p renal transplantation due to diabetic and hypertensive renal disease on 01/23/2013 at [facility] consulted to dose argatroban.  Argatroban stopped around midday, however will restart argatroban for the next 24h. Last APTT subtherapeutic at 43 on argatroban 0.8 mcg/kg/min, and will therefore start with higher dose. CBC stable, no noted bleeding.  Goal of Therapy:  aPTT 50-90  seconds seconds Monitor platelets by anticoagulation protocol: Yes   Plan:  Restart argatroban 1 mcg/kg/min 2130 aPTT Q2h aPTT until therapeutic x 2 Daily APTT, CBC Monitor s/sx bleeding   Heloise Ochoa, Pharm.Keturah Barre BCPS PGY2 Cardiology Pharmacy Resident Pager: [phone]  10/08/2015 6:37 PM    ADDENDUM: Initial aPTT 66, therapeutic on argatroban 1 mcg/kg/min. Will check confirmatory aPTT in 2h at 2330 and continue current dose. If second level therapeutic, will check aPTT at least q12h to ensure within therapeutic range while on argatroban.   Heloise Ochoa, [place].D., BCPS PGY2 Cardiology Pharmacy Resident Pager: [phone]

## 2015-10-08 NOTE — Consult Note (Signed)
Renal Service Consult Note [facility]  [name] 10/08/2015 [name] Requesting Physician:  Dr Einar Gip  Reason for Consult:  Acute renal insuff HPI: The patient is a 45 y.o. year-old with hx of DMx 79, HTN sx 10 yrs and functioning renal transplant admitted for ischemic R foot.  Pt had severe cramping of RLE w claudication and underwent R common iliac PTA by Dr Einar Gip about 2 wks ago; there was residual 80% iliac lesion couldn't be treated due to renal trans artery proximity.  Renal art was open.  [place] home. No returns with R foot pain and purplish toes on the R foot.  Went for repeat arteriogram on 4/21 w PTA/ stent to R ext iliac and PTA of SFA.  Has distal disease in that leg too. Ortho consulted and plan is for TMA.  Creat ^'[name] 1.3 yest and 1.4 today, asked to see for AKI.  Patient having diarrhea, no other complaints.   Patient developed renal failure in 2013, says she stopped taking her BP medications for 6 mos and ended up on dialysis. Had renal trasnplant in Aug 2014 at [facility], takes Myfortic/ prograf/ pred.  Baseline creat 1.0, followed by Dr. Posey Pronto.   Lives w her parents, never married, no children.  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  Has medicare + [employer]. No drug/ etoh / tobacoo use.     ROS  denies CP  no joint pain   no HA  no blurry vision  no rash  no diarrhea  no nausea/ vomiting  no dysuria  no difficulty voiding  no change in urine color    Past Medical History  Past Medical History  Diagnosis Date  . Retinopathy   . Metabolic bone disease   . Anemia   . CHF (congestive heart failure) ([place])   . History of blood transfusion     related to "kidney transplant"  . Hypertension     sees Dr. Carolin Guernsey  . High cholesterol   . ESRD (end stage renal disease) on dialysis ([place]) 09/28/2011-01/23/2013    M-W-F; [place]  . Pneumonia 09/27/2011  . Type II diabetes mellitus ([place])     "controlled with diet"  .  Critical ischemia of lower extremity hospitalized 10/05/2015    right   Past Surgical History  Past Surgical History  Procedure Laterality Date  . Insertion of dialysis catheter  10/04/2011    Procedure: INSERTION OF DIALYSIS CATHETER;  Surgeon: Angelia Mould, MD;  Location: [place];  Service: Vascular;  Laterality: Right;  insertion of dialysis catheter right internal jugular  . Av fistula placement  10/04/2011    Procedure: INSERTION OF ARTERIOVENOUS (AV) GORE-TEX GRAFT ARM;  Surgeon: Angelia Mould, MD;  Location: [place];  Service: Vascular;  Laterality: Left;  Insertion left upper arm Arteriovenous goretex graft  . Avgg removal  10/04/2011    Procedure: REMOVAL OF ARTERIOVENOUS GORETEX GRAFT ([place]);  Surgeon: Elam Dutch, MD;  Location: [place];  Service: Vascular;  Laterality: Left;  . Av fistula placement  10/29/2011    Procedure: ARTERIOVENOUS (AV) FISTULA CREATION;  Surgeon: Angelia Mould, MD;  Location: [facility] OR;  Service: Vascular;  Laterality: Right;  Creation Right Arteriovenous Fistula   . Revison of arteriovenous fistula  06/23/2012    Procedure: REVISON OF ARTERIOVENOUS FISTULA;  Surgeon: Angelia Mould, MD;  Location: [place];  Service: Vascular;  Laterality: Right;  Ultrasound guided  . Insertion of dialysis  catheter  06/23/2012    Procedure: INSERTION OF DIALYSIS CATHETER;  Surgeon: Angelia Mould, MD;  Location: [place];  Service: Vascular;  Laterality: N/A;  Ultrasound guided  . Patch angioplasty  06/23/2012    Procedure: PATCH ANGIOPLASTY;  Surgeon: Angelia Mould, MD;  Location: [facility] OR;  Service: Vascular;  Laterality: Right;  . Esophagogastroduodenoscopy N/A 12/24/2012    Procedure: ESOPHAGOGASTRODUODENOSCOPY (EGD);  Surgeon: Milus Banister, MD;  Location: [place];  Service: Endoscopy;  Laterality: N/A;  . Kidney transplant  01/24/2013  . Finger amputation Right 02/26/2013    "3rd finger"  . Shuntogram N/A 04/06/2012    Procedure:  Earney Mallet;  Surgeon: Angelia Mould, MD;  Location: [facility] CATH LAB;  Service: Cardiovascular;  Laterality: N/A;  . Peripheral vascular catheterization N/A 09/19/2015    Procedure: Lower Extremity Angiography;  Surgeon: Adrian Prows, MD;  Location: [place] CV LAB;  Service: Cardiovascular;  Laterality: N/A;  . Peripheral vascular catheterization N/A 09/19/2015    Procedure: Abdominal Aortogram;  Surgeon: Adrian Prows, MD;  Location: [place] CV LAB;  Service: Cardiovascular;  Laterality: N/A;  . Peripheral vascular catheterization Right 09/19/2015    Procedure: Peripheral Vascular Intervention;  Surgeon: Adrian Prows, MD;  Location: [place] CV LAB;  Service: Cardiovascular;  Laterality: Right;  Right Common  Iliac   Family History  Family History  Problem Relation Age of Onset  . Malignant hyperthermia Mother   . Malignant hyperthermia Father   . Anesthesia problems Neg Hx   . Thyroid disease Mother   . Diabetes Brother   . Heart disease Brother     CHF  . Hypertension Mother   . Hypertension Father    Social History  reports that she has never smoked. She has never used smokeless tobacco. She reports that she does not drink alcohol or use illicit drugs. Allergies No Known Allergies Home medications Prior to Admission medications   Medication Sig Start Date End Date Taking? Authorizing Provider  amLODipine (NORVASC) 10 MG tablet Take 10 mg by mouth at bedtime.  11/14/11  Yes Historical Provider, MD  aspirin EC 325 MG EC tablet Take 1 tablet (325 mg total) by mouth daily. 09/20/15  Yes Neldon Labella, NP  cilostazol (PLETAL) 100 MG tablet Take 100 mg by mouth 2 (two) times daily.   Yes Historical Provider, MD  insulin aspart (NOVOLOG) 100 UNIT/ML injection Inject 4-14 Units into the skin 3 (three) times daily before meals. Per sliding scale: 100-150, 4 units. 151-200, 6 units. 201-250, 8 units. 251-300, 10 units. 301-350, 12 units. Over 350, 14 units.   Yes Historical Provider, MD   insulin detemir (LEVEMIR) 100 UNIT/ML injection Inject 16-22 Units into the skin See admin instructions. 16 units in the morning and 22 units at bedtime   Yes Historical Provider, MD  labetalol (NORMODYNE) 300 MG tablet Take 300 mg by mouth daily.    Yes Historical Provider, MD  mycophenolate (MYFORTIC) 180 MG EC tablet Take 360-540 mg by mouth 2 (two) times daily. 540 mg in the morning and 360 mg at night   Yes Historical Provider, MD  oxyCODONE-acetaminophen (PERCOCET/ROXICET) 5-325 MG tablet Take 1-2 tablets by mouth every 4 (four) hours as needed for severe pain.   Yes Historical Provider, MD  pantoprazole (PROTONIX) 40 MG tablet Take 40 mg by mouth daily.   Yes Historical Provider, MD  pravastatin (PRAVACHOL) 20 MG tablet Take 20 mg by mouth at bedtime.  09/11/12  Yes Historical Provider, MD  sulfamethoxazole-trimethoprim (  BACTRIM,SEPTRA) 400-80 MG per tablet Take 1 tablet by mouth every Monday, Wednesday, and Friday.    Yes Amber Reeves-Daniel, DO  tacrolimus (PROGRAF) 0.5 MG capsule Take 0.5 mg by mouth 2 (two) times daily.   Yes Historical Provider, MD  tacrolimus (PROGRAF) 1 MG capsule Take 1.5 mg by mouth 2 (two) times daily.    Yes Historical Provider, MD  Vitamin [name], Ergocalciferol, (DRISDOL) 50000 units CAPS capsule Take 50,000 Units by mouth every 7 (seven) days. Sundays   Yes Historical Provider, MD  albuterol (PROVENTIL HFA;VENTOLIN HFA) 108 (90 BASE) MCG/ACT inhaler Inhale 1 puff into the lungs every 6 (six) hours as needed for wheezing or shortness of breath.    Historical Provider, MD   Liver Function Tests No results for input(s): AST, ALT, ALKPHOS, BILITOT, PROT, ALBUMIN in the last 168 hours. No results for input(s): LIPASE, AMYLASE in the last 168 hours. CBC  Recent Labs Lab 10/05/15 1721 10/07/15 0209 10/08/15 0440  WBC 2.8* 3.0* 3.2*  HGB 9.7* 8.1* 8.4*  HCT 30.7* 26.8* 27.0*  MCV 76.6* 76.8* 76.3*  PLT 417* 380 [id number]   Basic Metabolic Panel  Recent Labs Lab  10/05/15 1721 10/06/15 0224 10/07/15 0209 10/08/15 0440  NA 137 138 138 140  K 4.1 3.9 3.9 3.7  CL 100* 104 106 107  CO2 20* 22 19* 19*  GLUCOSE 185* 161* 164* 55*  BUN 19 17 14 14   CREATININE 1.14* 1.10* 1.35* 1.45*  CALCIUM 9.7 9.2 8.9 9.2    Filed Vitals:   10/07/15 0524 10/08/15 0623 10/08/15 0901 10/08/15 0927  BP: 163/60 152/59  149/62  Pulse: 111 107  101  Temp: 98.6 F (37 C) 98.3 F (36.8 C)    TempSrc: Oral Oral    Resp: 16 18 18    Height:      Weight:      SpO2: 100% 96% 97%    Exam Alert no distress, up in chair No rash, cyanosis or gangrene Sclera anicteric, throat clear  No jvd or bruits Chest clear bilat RRR no MRG , quiet Abd soft ntnd no mass or ascites +bs GU deferred MS no joint effusions or deformity Ext no leg or UE edema / no wounds or ulcers R foot is purple from mid-foot distally, tips of toes 1-4 are black on the underside L foot is normal in appearance Neuro is alert, Ox 3 , nf  Assessment: 1. Acute kidney injury [name]/t contrast , mild 2. Ischemic R foot , s/p repeat perc inteventions - for TMA per ortho 3. HTN approx 10 yrs  , cont labet/norvasc 4. DM x 10 yrs on insulin 5. Volume looks euvolemic  Plan - starting IVF"s , f/u creat in am.  Hopefully will recover promptly.  Urine lytes.   Kelly Splinter MD [employer] pager [phone]    cell [phone] 10/08/2015, 11:52 AM

## 2015-10-08 NOTE — Progress Notes (Signed)
ANTICOAGULATION CONSULT NOTE - Follow Up Consult  Pharmacy Consult for argatroban Indication: thromboembolic disease  Labs:  Recent Labs  10/06/15 0224  10/07/15 0209 10/08/15 0440  10/08/15 1319 10/08/15 2115 10/08/15 2322  HGB  --   --  8.1* 8.4*  --   --   --   --   HCT  --   --  26.8* 27.0*  --   --   --   --   PLT  --   --  380 382  --   --   --   --   APTT  --   < > 60* 100*  < > 43* 66* 73*  CREATININE 1.10*  --  1.35* 1.45*  --   --   --   --   < > = values in this interval not displayed.   Assessment/Plan:  45yo female remains therapeutic on argatroban after resuming. Will continue gtt at current rate and continue to monitor.   Wynona Neat, PharmD, BCPS  10/08/2015,11:59 PM

## 2015-10-08 NOTE — Progress Notes (Signed)
Patient ID: [name], female   DOB: 1971-04-15, 45 y.o.   MRN: [id number] Patient is seen in follow-up for critical limb ischemia right foot with dry gangrene of the toes.  On examination patient's midfoot is no longer cold but does have some warmth in the midfoot. Her pain has resolved.She has dry gangrene of all the toes, there is no cellulitis no ulcer no drainage no ascending cellulitis no signs of infection. Patient is making a recovery from her initial ischemic event. She is currently on Argatroban and is showing improved tissue perfusion to the midfoot. I feel the patient may be a candidate for foot salvage at this time. We will continue the thrombin inhibitor I will add a nitroglycerin patch to her ankle to try to help improve microcirculation.  possible transmetatarsal amputation on Friday.

## 2015-10-08 NOTE — Progress Notes (Addendum)
ANTICOAGULATION CONSULT NOTE - Follow Up Consult  Pharmacy Consult for argatroban Indication: thromboembolic disease  No Known Allergies  Patient Measurements: Height: 5' (152.4 cm) Weight: 141 lb (63.957 kg) IBW/kg (Calculated) : 45.5  Vital Signs: Temp: 98.3 F (36.8 C) (04/23 0623) Temp Source: Oral (04/23 0623) BP: 149/62 mmHg (04/23 0927) Pulse Rate: 101 (04/23 0927)  Labs:  Recent Labs  10/05/15 1721 10/06/15 0224  10/07/15 0209 10/08/15 0440 10/08/15 0921  HGB 9.7*  --   --  8.1* 8.4*  --   HCT 30.7*  --   --  26.8* 27.0*  --   PLT 417*  --   --  380 382  --   APTT  --   --   < > 60* 100* 77*  CREATININE 1.14* 1.10*  --  1.35* 1.45*  --   < > = values in this interval not displayed.  Estimated Creatinine Clearance: 40.5 mL/min (by C-G formula based on Cr of 1.45).   Medical History: Past Medical History  Diagnosis Date  . Retinopathy   . Metabolic bone disease   . Anemia   . CHF (congestive heart failure) ([place])   . History of blood transfusion     related to "kidney transplant"  . Hypertension     sees Dr. Carolin Guernsey  . High cholesterol   . ESRD (end stage renal disease) on dialysis ([place]) 09/28/2011-01/23/2013    M-W-F; [place]  . Pneumonia 09/27/2011  . Type II diabetes mellitus ([place])     "controlled with diet"  . Critical ischemia of lower extremity hospitalized 10/05/2015    right   Assessment: 45 year old female who presents with PVD and worsening leg pain s/p arteriogram intervention 2 with stenting for limb salvage intervention to the right foot. She is s/p renal transplantation due to diabetic and hypertensive renal disease on 01/23/2013 at [place] consulted to dose argatroban.  APTT 77, therapeutic this AM. CBC stable, no noted bleeding. R foot no longer cold and regaining some warmth. Will need confirmatory therapeutic level.   Goal of Therapy:  aPTT 50-90 seconds seconds Monitor platelets by anticoagulation  protocol: Yes   Plan:  Continue argatroban 0.8 mcg/kg/min STAT aPTT Daily APTT, CBC Monitor s/sx bleeding   Heloise Ochoa, Pharm.Keturah Barre BCPS PGY2 Cardiology Pharmacy Resident Pager: [phone]  10/08/2015 11:53 AM  ADDENDUM: Follow up aPTT 43, subtherapeutic. Will increase back to agatroban 1 mcg/kg/min (20% increase) and f/u with 2h aPTT at 1330.   Heloise Ochoa, [place].D., BCPS PGY2 Cardiology Pharmacy Resident Pager: [phone]  10/08/2015, 2:23 PM

## 2015-10-08 NOTE — Progress Notes (Signed)
ANTICOAGULATION CONSULT NOTE - Follow Up Consult  Pharmacy Consult for argatroban Indication: thromboembolic disease  Labs:  Recent Labs  10/05/15 1721 10/06/15 0224 10/06/15 2300 10/07/15 0209 10/08/15 0440  HGB 9.7*  --   --  8.1* 8.4*  HCT 30.7*  --   --  26.8* 27.0*  PLT 417*  --   --  380 382  APTT  --   --  59* 60* 100*  CREATININE 1.14* 1.10*  --  1.35* 1.45*     Assessment: 45yo female now above goal on argatroban after two PTTs at goal.  Goal of Therapy:  aPTT 50-90 seconds   Plan:  Will decrease argatroban by 20% to 0.8 mcg/kg/min and check PTT in 2hr.  Wynona Neat, PharmD, BCPS  10/08/2015,6:33 AM

## 2015-10-09 ENCOUNTER — Encounter (HOSPITAL_COMMUNITY): Payer: Self-pay | Admitting: Cardiology

## 2015-10-09 LAB — CBC
HCT: 24.8 % — ABNORMAL LOW (ref 36.0–46.0)
Hemoglobin: 7.6 g/dL — ABNORMAL LOW (ref 12.0–15.0)
MCH: 23.5 pg — ABNORMAL LOW (ref 26.0–34.0)
MCHC: 30.6 g/dL (ref 30.0–36.0)
MCV: 76.5 fL — ABNORMAL LOW (ref 78.0–100.0)
Platelets: 374 10*3/uL (ref 150–400)
RBC: 3.24 MIL/uL — ABNORMAL LOW (ref 3.87–5.11)
RDW: 14.8 % (ref 11.5–15.5)
WBC: 3.3 10*3/uL — ABNORMAL LOW (ref 4.0–10.5)

## 2015-10-09 LAB — BASIC METABOLIC PANEL
Anion gap: 12 (ref 5–15)
BUN: 17 mg/dL (ref 6–20)
CO2: 15 mmol/L — ABNORMAL LOW (ref 22–32)
Calcium: 8.9 mg/dL (ref 8.9–10.3)
Chloride: 111 mmol/L (ref 101–111)
Creatinine, Ser: 1.49 mg/dL — ABNORMAL HIGH (ref 0.44–1.00)
GFR calc Af Amer: 48 mL/min — ABNORMAL LOW (ref >60)
GFR calc non Af Amer: 41 mL/min — ABNORMAL LOW (ref >60)
Glucose, Bld: 179 mg/dL — ABNORMAL HIGH (ref 65–99)
Potassium: 3.9 mmol/L (ref 3.5–5.1)
Sodium: 138 mmol/L (ref 135–145)

## 2015-10-09 LAB — C DIFFICILE QUICK SCREEN W PCR REFLEX
C Diff antigen: NEGATIVE
C Diff interpretation: NEGATIVE
C Diff toxin: NEGATIVE

## 2015-10-09 LAB — GLUCOSE, CAPILLARY
Glucose-Capillary: 226 mg/dL — ABNORMAL HIGH (ref 65–99)
Glucose-Capillary: 86 mg/dL (ref 65–99)
Glucose-Capillary: 66 mg/dL (ref 65–99)
Glucose-Capillary: 86 mg/dL (ref 65–99)
Glucose-Capillary: 88 mg/dL (ref 65–99)
Glucose-Capillary: 128 mg/dL — ABNORMAL HIGH (ref 65–99)

## 2015-10-09 LAB — HEMOGLOBIN AND HEMATOCRIT, BLOOD
HCT: 25 % — ABNORMAL LOW (ref 36.0–46.0)
Hemoglobin: 7.7 g/dL — ABNORMAL LOW (ref 12.0–15.0)

## 2015-10-09 LAB — APTT: aPTT: 37 seconds (ref 24–37)

## 2015-10-09 MED ORDER — STERILE WATER FOR INJECTION IV SOLN: 3.0000 | INTRAVENOUS | Status: DC

## 2015-10-09 MED ORDER — LOPERAMIDE HCL 2 MG PO CAPS
2.0000 mg | ORAL_CAPSULE | Freq: Four times a day (QID) | ORAL | Status: DC | PRN
  Administered 2015-10-09 – 2015-10-10 (×2): 2 mg via ORAL
  Filled 2015-10-09 (×3): qty 1

## 2015-10-09 MED ORDER — STERILE WATER FOR INJECTION IV SOLN
INTRAVENOUS | Status: DC
  Administered 2015-10-09 – 2015-10-10 (×2): via INTRAVENOUS
  Filled 2015-10-09 (×6): qty 850

## 2015-10-09 MED ORDER — NITROGLYCERIN 0.2 MG/HR TD PT24: 0.2000 mg | MEDICATED_PATCH | Freq: Every day | TRANSDERMAL | Status: DC

## 2015-10-09 MED ORDER — FERROUS SULFATE 325 (65 FE) MG PO TABS
325.0000 mg | ORAL_TABLET | Freq: Three times a day (TID) | ORAL | Status: DC
  Administered 2015-10-09 – 2015-10-11 (×4): 325 mg via ORAL
  Filled 2015-10-09 (×4): qty 1

## 2015-10-09 NOTE — Progress Notes (Signed)
ANTICOAGULATION CONSULT NOTE - Initial Consult  Pharmacy Consult for argatroban Indication: thromboembolic disease  No Known Allergies  Patient Measurements: Height: 5' (152.4 cm) Weight: 141 lb (63.957 kg) IBW/kg (Calculated) : 45.5  Vital Signs: Temp: 98.9 F (37.2 C) (04/23 2031) Temp Source: Oral (04/23 2031) BP: 132/63 mmHg (04/23 2031) Pulse Rate: 104 (04/23 2031)  Labs:  Recent Labs  10/07/15 0209 10/08/15 0440  10/08/15 2115 10/08/15 2322 10/09/15 0318  HGB 8.1* 8.4*  --   --   --  7.6*  HCT 26.8* 27.0*  --   --   --  24.8*  PLT 380 382  --   --   --  374  APTT 60* 100*  < > 66* 73* 37  CREATININE 1.35* 1.45*  --   --   --  1.49*  < > = values in this interval not displayed.  Estimated Creatinine Clearance: 39.4 mL/min (by C-G formula based on Cr of 1.49).   Medical History: Past Medical History  Diagnosis Date  . Retinopathy   . Metabolic bone disease   . Anemia   . CHF (congestive heart failure) ([place])   . History of blood transfusion     related to "kidney transplant"  . Hypertension     sees Dr. Carolin Guernsey  . High cholesterol   . ESRD (end stage renal disease) on dialysis ([place]) 09/28/2011-01/23/2013    M-W-F; [place]  . Pneumonia 09/27/2011  . Type II diabetes mellitus ([place])     "controlled with diet"  . Critical ischemia of lower extremity hospitalized 10/05/2015    right   Assessment: 45 year old female who presents with PVD and worsening leg pain s/p arteriogram intervention 2 with stenting for limb salvage intervention to the right foot. She is s/p renal transplantation due to diabetic and hypertensive renal disease on 01/23/2013 at [place] consulted to dose argatroban.  Argatroban stopped around midday, however will restart argatroban for the next 24h. Last APTT subtherapeutic at 43 on argatroban 0.8 mcg/kg/min, and will therefore start with higher dose. CBC stable, no noted bleeding.  Pt has been therapeutic on  28mcg/kg/min until this morning and is now subtherapeutic with an aPTT of 37 sec. Nurse reports no issues with infusion or bleeding.  Goal of Therapy:  aPTT 50-90 seconds seconds Monitor platelets by anticoagulation protocol: Yes   Plan:  Increase argatroban to 1.3 mcg/kg/min (30% increase) Q2h aPTT until therapeutic x 2 Daily APTT, CBC Monitor s/sx bleeding   Andrey Cota. Diona Foley, PharmD, [place] Clinical Pharmacist Pager [phone]  10/09/2015 8:13 AM

## 2015-10-09 NOTE — Progress Notes (Signed)
Patient ID: [name], female   DOB: [birth year], 45 y.o.   MRN: [id number]  [name] is an 45 y.o. female.  HPI: 45 yo AAF with PMH sig for ESRD due to DM s/p kidney transplant with severe, limb-threatening ischemia s/p several percutaneous interventions.  Feels better after her last procedure and has not required any pain medications for several days.  Trend in Creatinine:  CREATININE, SER  Date/Time Value Ref Range Status  10/09/2015 03:18 AM 1.49* 0.44 - 1.00 mg/dL Final  10/08/2015 04:40 AM 1.45* 0.44 - 1.00 mg/dL Final  10/07/2015 02:09 AM 1.35* 0.44 - 1.00 mg/dL Final  10/06/2015 02:24 AM 1.10* 0.44 - 1.00 mg/dL Final  10/05/2015 05:21 PM 1.14* 0.44 - 1.00 mg/dL Final  09/19/2015 11:57 PM 0.90 0.44 - 1.00 mg/dL Final  09/18/2015 02:39 PM 1.19* 0.44 - 1.00 mg/dL Final  08/28/2015 01:48 PM 1.12* 0.44 - 1.00 mg/dL Final  11/21/2013 09:50 AM 0.98 0.50 - 1.10 mg/dL Final  12/25/2012 06:46 AM 5.77* 0.50 - 1.10 mg/dL Final  12/24/2012 04:55 AM 4.48* 0.50 - 1.10 mg/dL Final  12/23/2012 09:50 AM 7.68* 0.50 - 1.10 mg/dL Final  12/22/2012 04:55 AM 5.66* 0.50 - 1.10 mg/dL Final  12/21/2012 01:41 PM 10.60* 0.50 - 1.10 mg/dL Final  12/21/2012 05:00 AM 9.66* 0.50 - 1.10 mg/dL Final  12/20/2012 05:50 AM 8.72* 0.50 - 1.10 mg/dL Final  12/19/2012 09:30 PM 8.52* 0.50 - 1.10 mg/dL Final  06/25/2012 02:04 PM 3.20* 0.50 - 1.10 mg/dL Final  04/06/2012 06:33 AM 5.50* 0.50 - 1.10 mg/dL Final  03/16/2012 05:46 AM 6.70* 0.50 - 1.10 mg/dL Final  10/18/2011 09:42 AM 4.39* 0.50 - 1.10 mg/dL Final  10/16/2011 04:08 PM 5.98* 0.50 - 1.10 mg/dL Final  10/12/2011 03:30 PM 4.72* 0.50 - 1.10 mg/dL Final  10/10/2011 02:48 PM 4.49* 0.50 - 1.10 mg/dL Final  10/08/2011 08:07 AM 5.18* 0.50 - 1.10 mg/dL Final  10/07/2011 06:05 AM 4.41* 0.50 - 1.10 mg/dL Final  10/04/2011 08:07 AM 3.76* 0.50 - 1.10 mg/dL Final  10/03/2011 06:31 AM 5.64* 0.50 - 1.10 mg/dL Final  10/02/2011 05:30 AM 4.98* 0.50 - 1.10 mg/dL  Final  09/30/2011 12:30 PM 4.62* 0.50 - 1.10 mg/dL Final  09/29/2011 04:40 AM 5.19* 0.50 - 1.10 mg/dL Final  09/27/2011 07:26 PM 5.87* 0.50 - 1.10 mg/dL Final  09/27/2011 03:05 PM 6.11* 0.50 - 1.10 mg/dL Final  04/04/2011 04:53 AM 5.48* 0.50 - 1.10 mg/dL Final  04/03/2011 05:24 AM 5.78* 0.50 - 1.10 mg/dL Final  04/03/2011 05:20 AM 5.87* 0.50 - 1.10 mg/dL Final  04/02/2011 05:14 AM 5.95* 0.50 - 1.10 mg/dL Final  04/01/2011 05:06 AM 5.25* 0.50 - 1.10 mg/dL Final  03/31/2011 05:59 AM 5.03* 0.50 - 1.10 mg/dL Final  03/30/2011 08:40 AM 4.54* 0.50 - 1.10 mg/dL Final  03/29/2011 07:09 PM 4.24* 0.50 - 1.10 mg/dL Final  12/25/2009 09:52 PM 1.38* 0.40-1.20 mg/dL Final  04/28/2009 10:17 PM 1.14 0.40-1.20 mg/dL Final  03/31/2009 07:55 PM 1.00 0.40-1.20 mg/dL Final  08/06/2007 08:29 PM 0.84 0.40-1.20 mg/dL Final  01/02/2007 06:39 PM 0.90 0.40-1.20 mg/dL Final    PMH:   Past Medical History  Diagnosis Date  . Retinopathy   . Metabolic bone disease   . Anemia   . CHF (congestive heart failure) ([place])   . History of blood transfusion     related to "kidney transplant"  . Hypertension     sees Dr. Carolin Guernsey  . High cholesterol   . ESRD (end stage renal  disease) on dialysis ([facility]) 09/28/2011-01/23/2013    M-W-F; [place]  . Pneumonia 09/27/2011  . Type II diabetes mellitus ([place])     "controlled with diet"  . Critical ischemia of lower extremity hospitalized 10/05/2015    right    PSH:   Past Surgical History  Procedure Laterality Date  . Insertion of dialysis catheter  10/04/2011    Procedure: INSERTION OF DIALYSIS CATHETER;  Surgeon: Angelia Mould, MD;  Location: [place];  Service: Vascular;  Laterality: Right;  insertion of dialysis catheter right internal jugular  . Av fistula placement  10/04/2011    Procedure: INSERTION OF ARTERIOVENOUS (AV) GORE-TEX GRAFT ARM;  Surgeon: Angelia Mould, MD;  Location: [place];  Service: Vascular;  Laterality: Left;  Insertion left upper  arm Arteriovenous goretex graft  . Avgg removal  10/04/2011    Procedure: REMOVAL OF ARTERIOVENOUS GORETEX GRAFT ([place]);  Surgeon: Elam Dutch, MD;  Location: [place];  Service: Vascular;  Laterality: Left;  . Av fistula placement  10/29/2011    Procedure: ARTERIOVENOUS (AV) FISTULA CREATION;  Surgeon: Angelia Mould, MD;  Location: [facility] OR;  Service: Vascular;  Laterality: Right;  Creation Right Arteriovenous Fistula   . Revison of arteriovenous fistula  06/23/2012    Procedure: REVISON OF ARTERIOVENOUS FISTULA;  Surgeon: Angelia Mould, MD;  Location: [place];  Service: Vascular;  Laterality: Right;  Ultrasound guided  . Insertion of dialysis catheter  06/23/2012    Procedure: INSERTION OF DIALYSIS CATHETER;  Surgeon: Angelia Mould, MD;  Location: [place];  Service: Vascular;  Laterality: N/A;  Ultrasound guided  . Patch angioplasty  06/23/2012    Procedure: PATCH ANGIOPLASTY;  Surgeon: Angelia Mould, MD;  Location: [facility] OR;  Service: Vascular;  Laterality: Right;  . Esophagogastroduodenoscopy N/A 12/24/2012    Procedure: ESOPHAGOGASTRODUODENOSCOPY (EGD);  Surgeon: Milus Banister, MD;  Location: [place];  Service: Endoscopy;  Laterality: N/A;  . Kidney transplant  01/24/2013  . Finger amputation Right 02/26/2013    "3rd finger"  . Shuntogram N/A 04/06/2012    Procedure: Earney Mallet;  Surgeon: Angelia Mould, MD;  Location: [facility] CATH LAB;  Service: Cardiovascular;  Laterality: N/A;  . Peripheral vascular catheterization N/A 09/19/2015    Procedure: Lower Extremity Angiography;  Surgeon: Adrian Prows, MD;  Location: [place] CV LAB;  Service: Cardiovascular;  Laterality: N/A;  . Peripheral vascular catheterization N/A 09/19/2015    Procedure: Abdominal Aortogram;  Surgeon: Adrian Prows, MD;  Location: [place] CV LAB;  Service: Cardiovascular;  Laterality: N/A;  . Peripheral vascular catheterization Right 09/19/2015    Procedure: Peripheral Vascular Intervention;  Surgeon:  Adrian Prows, MD;  Location: [place] CV LAB;  Service: Cardiovascular;  Laterality: Right;  Right Common  Iliac  . Peripheral vascular catheterization N/A 10/06/2015    Procedure: Lower Extremity Angiography;  Surgeon: Adrian Prows, MD;  Location: [place] CV LAB;  Service: Cardiovascular;  Laterality: N/A;  . Peripheral vascular catheterization  10/06/2015    Procedure: Peripheral Vascular Intervention;  Surgeon: Adrian Prows, MD;  Location: [place] CV LAB;  Service: Cardiovascular;;  REIA  Omnilink 6.0x29, innova 7x80  RSFA innova 5x80    Allergies: No Known Allergies  Medications:   Prior to Admission medications   Medication Sig Start Date End Date Taking? Authorizing Provider  amLODipine (NORVASC) 10 MG tablet Take 10 mg by mouth at bedtime.  11/14/11  Yes Historical Provider, MD  aspirin EC 325 MG EC tablet Take 1 tablet (  325 mg total) by mouth daily. 09/20/15  Yes Neldon Labella, NP  cilostazol (PLETAL) 100 MG tablet Take 100 mg by mouth 2 (two) times daily.   Yes Historical Provider, MD  insulin aspart (NOVOLOG) 100 UNIT/ML injection Inject 4-14 Units into the skin 3 (three) times daily before meals. Per sliding scale: 100-150, 4 units. 151-200, 6 units. 201-250, 8 units. 251-300, 10 units. 301-350, 12 units. Over 350, 14 units.   Yes Historical Provider, MD  insulin detemir (LEVEMIR) 100 UNIT/ML injection Inject 16-22 Units into the skin See admin instructions. 16 units in the morning and 22 units at bedtime   Yes Historical Provider, MD  labetalol (NORMODYNE) 300 MG tablet Take 300 mg by mouth daily.    Yes Historical Provider, MD  mycophenolate (MYFORTIC) 180 MG EC tablet Take 360-540 mg by mouth 2 (two) times daily. 540 mg in the morning and 360 mg at night   Yes Historical Provider, MD  oxyCODONE-acetaminophen (PERCOCET/ROXICET) 5-325 MG tablet Take 1-2 tablets by mouth every 4 (four) hours as needed for severe pain.   Yes Historical Provider, MD  pantoprazole (PROTONIX) 40 MG tablet  Take 40 mg by mouth daily.   Yes Historical Provider, MD  pravastatin (PRAVACHOL) 20 MG tablet Take 20 mg by mouth at bedtime.  09/11/12  Yes Historical Provider, MD  sulfamethoxazole-trimethoprim (BACTRIM,SEPTRA) 400-80 MG per tablet Take 1 tablet by mouth every Monday, Wednesday, and Friday.    Yes Amber Reeves-Daniel, DO  tacrolimus (PROGRAF) 0.5 MG capsule Take 0.5 mg by mouth 2 (two) times daily.   Yes Historical Provider, MD  tacrolimus (PROGRAF) 1 MG capsule Take 1.5 mg by mouth 2 (two) times daily.    Yes Historical Provider, MD  Vitamin D, Ergocalciferol, (DRISDOL) 50000 units CAPS capsule Take 50,000 Units by mouth every 7 (seven) days. Sundays   Yes Historical Provider, MD  albuterol (PROVENTIL HFA;VENTOLIN HFA) 108 (90 BASE) MCG/ACT inhaler Inhale 1 puff into the lungs every 6 (six) hours as needed for wheezing or shortness of breath.    Historical Provider, MD  nitroGLYCERIN (NITRODUR - DOSED IN MG/24 HR) 0.2 mg/hr patch Place 1 patch (0.2 mg total) onto the skin daily. 10/09/15   Newt Minion, MD    Inpatient medications: . amLODipine  10 mg Oral QHS  . aspirin EC  81 mg Oral Daily  . cilostazol  100 mg Oral BID  . clopidogrel  75 mg Oral Daily  . dextrose  25 mL Intravenous Once  . ferrous sulfate  325 mg Oral TID WC  . insulin aspart  0-20 Units Subcutaneous TID WC  . insulin aspart  3 Units Subcutaneous TID WC  . insulin detemir  16 Units Subcutaneous Daily   And  . insulin detemir  22 Units Subcutaneous QHS  . labetalol  300 mg Oral Daily  . mycophenolate  540 mg Oral Daily   And  . mycophenolate  360 mg Oral QHS  . nitroGLYCERIN  0.2 mg Transdermal Daily  . pantoprazole  40 mg Oral Daily  . pravastatin  20 mg Oral QHS  . sodium chloride flush  3 mL Intravenous Q12H  . sulfamethoxazole-trimethoprim  1 tablet Oral Q M,W,F  . tacrolimus  1.5 mg Oral BID    Discontinued Meds:   Medications Discontinued During This Encounter  Medication Reason  . mycophenolate  (MYFORTIC) EC tablet 360-540 mg Entry Error  . insulin detemir (LEVEMIR) injection 16-22 Units Entry Error  . tacrolimus (PROGRAF) capsule 0.5 mg Duplicate  .  iodixanol (VISIPAQUE) 320 MG/ML injection Patient Discharge  . heparin 1,000 mL, lidocaine (PF) (XYLOCAINE) 1 % 20 mL Patient Discharge  . nitroGLYCERIN 1 mg/10 ml (100 mcg/ml) - IR/CATH LAB Patient Discharge  . heparin injection Patient Discharge  . midazolam (VERSED) injection Patient Discharge  . fentaNYL (SUBLIMAZE) injection Patient Discharge  . 0.9 %  sodium chloride infusion Patient Transfer  . 0.9 %  sodium chloride infusion   . heparin injection 5,000 Units   . aspirin EC tablet 325 mg   . argatroban 1 mg/mL infusion   . argatroban 1 mg/mL infusion     Social History:  reports that she has never smoked. She has never used smokeless tobacco. She reports that she does not drink alcohol or use illicit drugs.  Family History:   Family History  Problem Relation Age of Onset  . Malignant hyperthermia Mother   . Malignant hyperthermia Father   . Anesthesia problems Neg Hx   . Thyroid disease Mother   . Diabetes Brother   . Heart disease Brother     CHF  . Hypertension Mother   . Hypertension Father     Only complaint is pain in right foot which has improved following last procedure Weight change:   Intake/Output Summary (Last 24 hours) at 10/09/15 1040 Last data filed at 10/08/15 1630  Gross per 24 hour  Intake    260 ml  Output      0 ml  Net    260 ml   BP 164/62 mmHg  Pulse 111  Temp(Src) 98.9 F (37.2 C) (Oral)  Resp 18  Ht 5' (1.524 m)  Wt 63.957 kg (141 lb)  BMI 27.54 kg/m2  SpO2 100%  LMP 09/24/2015 (Approximate) Filed Vitals:   10/08/15 0927 10/08/15 1323 10/08/15 2031 10/09/15 0955  BP: 149/62 119/59 132/63 164/62  Pulse: 101 92 104 111  Temp:  98.4 F (36.9 C) 98.9 F (37.2 C)   TempSrc:  Oral Oral   Resp:  17 18   Height:      Weight:      SpO2:  100% 100%      General appearance:  alert, cooperative and no distress Head: Normocephalic, without obvious abnormality, atraumatic Resp: clear to auscultation bilaterally Cardio: regular rate and rhythm, S1, S2 normal, no murmur, click, rub or gallop GI: soft, non-tender; bowel sounds normal; no masses,  no organomegaly Extremities: no edema, well demarcated dry gangrenous changes to right foot, RUE AVF +T/B  Labs: Basic Metabolic Panel:  Recent Labs Lab 10/05/15 1721 10/06/15 0224 10/07/15 0209 10/08/15 0440 10/09/15 0318  NA 137 138 138 140 138  K 4.1 3.9 3.9 3.7 3.9  CL 100* 104 106 107 111  CO2 20* 22 19* 19* 15*  GLUCOSE 185* 161* 164* 55* 179*  BUN 19 17 14 14 17   CREATININE 1.14* 1.10* 1.35* 1.45* 1.49*  CALCIUM 9.7 9.2 8.9 9.2 8.9   Liver Function Tests: No results for input(s): AST, ALT, ALKPHOS, BILITOT, PROT, ALBUMIN in the last 168 hours. No results for input(s): LIPASE, AMYLASE in the last 168 hours. No results for input(s): AMMONIA in the last 168 hours. CBC:  Recent Labs Lab 10/05/15 1721 10/07/15 0209 10/08/15 0440 10/09/15 0318  WBC 2.8* 3.0* 3.2* 3.3*  HGB 9.7* 8.1* 8.4* 7.6*  HCT 30.7* 26.8* 27.0* 24.8*  MCV 76.6* 76.8* 76.3* 76.5*  PLT 417* 380 382 374   PT/INR: @LABRCNTIP (inr:5) Cardiac Enzymes: )No results for input(s): CKTOTAL, CKMB, CKMBINDEX, TROPONINI in the  last 168 hours. CBG:  Recent Labs Lab 10/08/15 0938 10/08/15 1110 10/08/15 1605 10/08/15 2022 10/09/15 0606  GLUCAP 90 181* 95 226* 86    Iron Studies: No results for input(s): IRON, TIBC, TRANSFERRIN, FERRITIN in the last 168 hours.  Xrays/Other Studies: No results found.   Assessment/Plan: 1.  AKI in transplant patient following IV contrasted intervention.  Rise in Scr has slowed.  Good UOP will continue to follow 2. Ischemic R foot s/p repeat percutaneous interventions with improvement of pain.  For transmet amputation per ortho 3. Renal transplantation- continue with outpatient immunosuppressive  regimen 4. Metabolic acidosis- changed IV NS to isotonic bicarb.  Will cont to follow. 5. HTN- stable 6. DM- on insulin 7. ABLA- may require blood transfusion.  Continue to follow   Donetta Potts 10/09/2015, 10:40 AM

## 2015-10-09 NOTE — Progress Notes (Addendum)
Subjective:  Patient states that her right leg pain is significantly improved. Pain significantly better. Sitting up in chair.  Objective:  Vital Signs in the last 24 hours: Temp:  [98.4 F (36.9 C)-98.9 F (37.2 C)] 98.9 F (37.2 C) (04/23 2031) Pulse Rate:  [92-104] 104 (04/23 2031) Resp:  [17-18] 18 (04/23 2031) BP: (119-149)/(59-63) 132/63 mmHg (04/23 2031) SpO2:  [100 %] 100 % (04/23 2031)  Intake/Output from previous day: 04/23 0701 - 04/24 0700 In: 363 [P.O.:360; I.V.:3] Out: 300 [Urine:300]  Physical Exam:   General appearance: alert, cooperative, appears stated age and no distress Eyes: negative findings: lids and lashes normal Neck: no adenopathy, no carotid bruit, no JVD, supple, symmetrical, trachea midline and thyroid not enlarged, symmetric, no tenderness/mass/nodules Neck: JVP - normal, carotids 2+= without bruits Resp: clear to auscultation bilaterally Chest wall: no tenderness Cardio: S1 and S2 is normal, there is a 1 to 2/6 systolic ejection murmur at the right sternal border. No gallop. GI: soft, non-tender; bowel sounds normal; no masses,  no organomegaly Extremities: right lower extremity,right foot, is gangrenous, dry gangrene. There is clear-cut demarcation at the level of the midfoot. The right leg itself is warm and nontender to touch. Very minimal edema is present. Other extra duties are normal. Evidence of chronic arterial insufficiency is evident in the left leg with shiny skin and absent hair.  Arterial exam: Left femoral arterial access has healed well.2+ bilateral femoral pulses without bruit.Popliteal pulsenot felt bilaterally, absent pedal pulses. Carotids normal.  Lab Results: BMP  Recent Labs  10/07/15 0209 10/08/15 0440 10/09/15 0318  NA 138 140 138  K 3.9 3.7 3.9  CL 106 107 111  CO2 19* 19* 15*  GLUCOSE 164* 55* 179*  BUN 14 14 17   CREATININE 1.35* 1.45* 1.49*  CALCIUM 8.9 9.2 8.9  GFRNONAA 46* 42* 41*  GFRAA 54* 49* 48*   CBC  Latest Ref Rng 10/09/2015 10/08/2015 10/07/2015  WBC 4.0 - 10.5 K/uL 3.3(L) 3.2(L) 3.0(L)  Hemoglobin 12.0 - 15.0 g/dL 7.6(L) 8.4(L) 8.1(L)  Hematocrit 36.0 - 46.0 % 24.8(L) 27.0(L) 26.8(L)  Platelets 150 - 400 K/uL 374 382 380      Recent Labs Lab 10/09/15 0318  WBC 3.3*  RBC 3.24*  HGB 7.6*  HCT 24.8*  PLT 374  MCV 76.5*  MCH 23.5*  MCHC 30.6  RDW 14.8    HEMOGLOBIN A1C Lab Results  Component Value Date   HGBA1C 6.3* 12/20/2012   MPG 134* 12/20/2012    Recent Labs  08/28/15 1348  PROT 6.3*  ALBUMIN 3.7  AST 21  ALT 9*  ALKPHOS 247*  BILITOT 0.5   Cardiac Studies: ABI findings from 10/07/2015 noted. There is clearly presence of blood flow through the lower extremity, comparable very closely to the left lower extremity  Scheduled Meds: . amLODipine  10 mg Oral QHS  . aspirin EC  81 mg Oral Daily  . cilostazol  100 mg Oral BID  . clopidogrel  75 mg Oral Daily  . dextrose  25 mL Intravenous Once  . insulin aspart  0-20 Units Subcutaneous TID WC  . insulin aspart  3 Units Subcutaneous TID WC  . insulin detemir  16 Units Subcutaneous Daily   And  . insulin detemir  22 Units Subcutaneous QHS  . labetalol  300 mg Oral Daily  . mycophenolate  540 mg Oral Daily   And  . mycophenolate  360 mg Oral QHS  . nitroGLYCERIN  0.2 mg Transdermal Daily  .  pantoprazole  40 mg Oral Daily  . pravastatin  20 mg Oral QHS  . sodium chloride flush  3 mL Intravenous Q12H  . sulfamethoxazole-trimethoprim  1 tablet Oral Q M,W,F  . tacrolimus  1.5 mg Oral BID   Continuous Infusions: . sodium chloride 1 mL (10/08/15 1427)   PRN Meds:.acetaminophen, albuterol, ondansetron (ZOFRAN) IV, oxyCODONE-acetaminophen, sodium chloride flush   Assessment/Plan:  1. Critical limb ischemia, dry gangrene right foot. Much improved perfusionand clear-cut demarcation of the skin at the level of the mid foot. 2.Diabetes mellitus controlled on insulin. 3. Acute on chronic renal  insufficiency.Probably related to metabolic issues and probable contrast exposure. Patient not on any nephrotoxic agent. Nephrology consulted. 4. Status post renal transplant status.  Recommendation: Again appreciate Dr. Sharol Given seeing the patient and possible minor amputation (transmetatarsal) possibly Friday (IP vs OP).  She can be discharged home and Dr. Sharol Given plans on seeing her Thursday and operate Friday.  If nephrology is okay with discharge, will do so. Hg trending down probably due to lab draw, anemia of chronic disease and Argatroban.  Supplement iron.  Diarrhoea better. C-Dif negative   Adrian Prows, M.D. 10/09/2015, 9:26 AM [facility] Pager: [phone] Office: [phone] If no answer: [phone]

## 2015-10-09 NOTE — Progress Notes (Signed)
Pt. Had 10 loose bowel movement overnight. Dr. Einar Gip notified. Placed on enteric precaution to r/o C.diff. Stool sample sent to the lab. Awaiting results. Pt is now resting comfortably. Will continue to monitor closely.

## 2015-10-09 NOTE — Care Management Important Message (Signed)
Important Message  Patient Details  Name: [name] MRN: [id number] Date of Birth: June 21, 1970   Medicare Important Message Given:  Yes    Nathen May 10/09/2015, 12:19 PM

## 2015-10-10 LAB — HEMOGLOBIN AND HEMATOCRIT, BLOOD
HCT: 24.7 % — ABNORMAL LOW (ref 36.0–46.0)
Hemoglobin: 7.6 g/dL — ABNORMAL LOW (ref 12.0–15.0)

## 2015-10-10 LAB — RENAL FUNCTION PANEL
Albumin: 2.2 g/dL — ABNORMAL LOW (ref 3.5–5.0)
Anion gap: 12 (ref 5–15)
BUN: 11 mg/dL (ref 6–20)
CO2: 22 mmol/L (ref 22–32)
Calcium: 8.5 mg/dL — ABNORMAL LOW (ref 8.9–10.3)
Chloride: 107 mmol/L (ref 101–111)
Creatinine, Ser: 1.19 mg/dL — ABNORMAL HIGH (ref 0.44–1.00)
GFR calc Af Amer: >60 mL/min (ref >60)
GFR calc non Af Amer: 54 mL/min — ABNORMAL LOW (ref >60)
Glucose, Bld: 121 mg/dL — ABNORMAL HIGH (ref 65–99)
Phosphorus: 3.2 mg/dL (ref 2.5–4.6)
Potassium: 3.7 mmol/L (ref 3.5–5.1)
Sodium: 141 mmol/L (ref 135–145)

## 2015-10-10 LAB — GLUCOSE, CAPILLARY
Glucose-Capillary: 110 mg/dL — ABNORMAL HIGH (ref 65–99)
Glucose-Capillary: 193 mg/dL — ABNORMAL HIGH (ref 65–99)
Glucose-Capillary: 113 mg/dL — ABNORMAL HIGH (ref 65–99)
Glucose-Capillary: 27 mg/dL — CL (ref 65–99)

## 2015-10-10 NOTE — Progress Notes (Signed)
Subjective:  Doing ok this AM. No complaints today.  Objective Vital signs in last 24 hours: Filed Vitals:   10/09/15 0955 10/09/15 2115 10/10/15 0515 10/10/15 1009  BP: 164/62 171/67 147/53 182/77  Pulse: 111 108 112 111  Temp:  98.9 F (37.2 C) 99.9 F (37.7 C)   TempSrc:  Oral Oral   Resp:  18 17   Height:      Weight:      SpO2:  100% 99%    Weight change:   Intake/Output Summary (Last 24 hours) at 10/10/15 1315 Last data filed at 10/10/15 0820  Gross per 24 hour  Intake 1798.33 ml  Output      0 ml  Net 1798.33 ml    Assessment/ Plan: Pt is a 45 y.o. yo female who was admitted on 10/05/2015 with limb-threatening ischemia s/p several percutaneous interventions. She has a PMH of ESRD 2/2 poorly controlled DM and is s/p kidney transplant. Assessment/Plan: Assessment/Plan: 1. AKI in a transplant patient following IV contrasted intervention. Cr now improved to 1.19. Good UOP. Decrease IVF to 27ml/hr. Will continue to follow. (Follows w/ Dr. Posey Pronto as OP). If continues to improve, can DC tomorrow from renal standpoint. 2. Ischemic R foot s/p repeat percutaneous interventions with improvement of pain. For transmet amputation per ortho 3. Renal transplantation- continue with outpatient immunosuppressive regimen 4. Metabolic acidosis- changed IV NS to isotonic bicarb. Will cont to follow. 5. HTN- stable 6. DM- on insulin 7. ABLA- may require blood transfusion. Continue to follow    [name]    Labs: Basic Metabolic Panel:  Recent Labs Lab 10/08/15 0440 10/09/15 0318 10/10/15 0829  NA 140 138 141  K 3.7 3.9 3.7  CL 107 111 107  CO2 19* 15* 22  GLUCOSE 55* 179* 121*  BUN 14 17 11   CREATININE 1.45* 1.49* 1.19*  CALCIUM 9.2 8.9 8.5*  PHOS  --   --  3.2   Liver Function Tests:  Recent Labs Lab 10/10/15 0829  ALBUMIN 2.2*   No results for input(s): LIPASE, AMYLASE in the last 168 hours. No results for input(s): AMMONIA in the last 168  hours. CBC:  Recent Labs Lab 10/05/15 1721 10/07/15 0209 10/08/15 0440 10/09/15 0318 10/09/15 1036 10/10/15 0829  WBC 2.8* 3.0* 3.2* 3.3*  --   --   HGB 9.7* 8.1* 8.4* 7.6* 7.7* 7.6*  HCT 30.7* 26.8* 27.0* 24.8* 25.0* 24.7*  MCV 76.6* 76.8* 76.3* 76.5*  --   --   PLT 417* 380 382 374  --   --    Cardiac Enzymes: No results for input(s): CKTOTAL, CKMB, CKMBINDEX, TROPONINI in the last 168 hours. CBG:  Recent Labs Lab 10/09/15 1334 10/09/15 1700 10/09/15 2110 10/10/15 0603 10/10/15 1119  GLUCAP 86 88 128* 110* 193*    Iron Studies: No results for input(s): IRON, TIBC, TRANSFERRIN, FERRITIN in the last 72 hours. Studies/Results: No results found. Medications: Infusions: .  sodium bicarbonate 150 mEq in sterile water 1000 mL infusion 25 mL/hr at 10/10/15 [id number]    Scheduled Medications: . amLODipine  10 mg Oral QHS  . aspirin EC  81 mg Oral Daily  . cilostazol  100 mg Oral BID  . clopidogrel  75 mg Oral Daily  . dextrose  25 mL Intravenous Once  . ferrous sulfate  325 mg Oral TID WC  . insulin aspart  0-20 Units Subcutaneous TID WC  . insulin aspart  3 Units Subcutaneous TID WC  . insulin detemir  16  Units Subcutaneous Daily   And  . insulin detemir  22 Units Subcutaneous QHS  . labetalol  300 mg Oral Daily  . mycophenolate  540 mg Oral Daily   And  . mycophenolate  360 mg Oral QHS  . nitroGLYCERIN  0.2 mg Transdermal Daily  . pantoprazole  40 mg Oral Daily  . pravastatin  20 mg Oral QHS  . sodium chloride flush  3 mL Intravenous Q12H  . sulfamethoxazole-trimethoprim  1 tablet Oral Q M,W,F  . tacrolimus  1.5 mg Oral BID    have reviewed scheduled and prn medications.  Physical Exam: General: alert, cooperative and no distress Head: Normocephalic, without obvious abnormality, atraumatic Resp: clear to auscultation bilaterally Cardio: RRR, no murmur, click, rub or gallop GI: soft, non-tender; bowel sounds normal; no masses, no organomegaly Extremities:  no edema, well demarcated dry gangrenous changes to right midfoot, RUE AVF +T/B   Elberta Leatherwood, MD,MS,  PGY2 10/10/2015 1:15 PM   LOS: 5 days      I have seen and examined this patient and agree with plan as outlined by Dr. Alease Frame.  Will decrease IVF's and if Scr remains stable/improved by tomorrow ok for discharge and follow up with Dr. Posey Pronto in our office in 2-3 weeks. Joy John A Elise Knobloch,MD 10/10/2015 1:17 PM

## 2015-10-10 NOTE — Progress Notes (Signed)
Utilization review completed.  

## 2015-10-10 NOTE — Progress Notes (Signed)
Inpatient Diabetes Program Recommendations  AACE/ADA: New Consensus Statement on Inpatient Glycemic Control (2015)  Target Ranges:  Prepandial:   less than 140 mg/dL      Peak postprandial:   less than 180 mg/dL (1-2 hours)      Critically ill patients:  140 - 180 mg/dL   Review of Glycemic Control  No Levemir given last 24 hours and glucose is trending low.  Recommend discontinuing HS dose of Levemir. Thank you  Raoul Pitch BSN, RN,CDE Inpatient Diabetes Coordinator [phone] (team pager)

## 2015-10-11 LAB — RENAL FUNCTION PANEL
Albumin: 2 g/dL — ABNORMAL LOW (ref 3.5–5.0)
Anion gap: 11 (ref 5–15)
BUN: 9 mg/dL (ref 6–20)
CO2: 26 mmol/L (ref 22–32)
Calcium: 8.4 mg/dL — ABNORMAL LOW (ref 8.9–10.3)
Chloride: 102 mmol/L (ref 101–111)
Creatinine, Ser: 1.11 mg/dL — ABNORMAL HIGH (ref 0.44–1.00)
GFR calc Af Amer: >60 mL/min (ref >60)
GFR calc non Af Amer: 59 mL/min — ABNORMAL LOW (ref >60)
Glucose, Bld: 153 mg/dL — ABNORMAL HIGH (ref 65–99)
Phosphorus: 3.1 mg/dL (ref 2.5–4.6)
Potassium: 3.5 mmol/L (ref 3.5–5.1)
Sodium: 139 mmol/L (ref 135–145)

## 2015-10-11 LAB — CBC
HCT: 23.8 % — ABNORMAL LOW (ref 36.0–46.0)
Hemoglobin: 7.4 g/dL — ABNORMAL LOW (ref 12.0–15.0)
MCH: 23.3 pg — ABNORMAL LOW (ref 26.0–34.0)
MCHC: 31.1 g/dL (ref 30.0–36.0)
MCV: 75.1 fL — ABNORMAL LOW (ref 78.0–100.0)
Platelets: 385 10*3/uL (ref 150–400)
RBC: 3.17 MIL/uL — ABNORMAL LOW (ref 3.87–5.11)
RDW: 14.5 % (ref 11.5–15.5)
WBC: 2.5 10*3/uL — ABNORMAL LOW (ref 4.0–10.5)

## 2015-10-11 LAB — GLUCOSE, CAPILLARY: Glucose-Capillary: 100 mg/dL — ABNORMAL HIGH (ref 65–99)

## 2015-10-11 MED ORDER — ASPIRIN 81 MG PO TBEC: 81.0000 mg | DELAYED_RELEASE_TABLET | Freq: Every day | ORAL | Status: DC

## 2015-10-11 MED ORDER — FERROUS SULFATE 325 (65 FE) MG PO TABS: 325.0000 mg | ORAL_TABLET | Freq: Three times a day (TID) | ORAL | Status: DC

## 2015-10-11 NOTE — Discharge Summary (Signed)
Physician Discharge Summary  Patient ID: CHAMIRA GOEGLEIN MRN: [id number] DOB/AGE: 1971-01-21 45 y.o.  Admit date: 10/05/2015 Discharge date: 10/11/2015  Primary Discharge Diagnosis Dry gangrene of the right foot due to diabetic PAD PAD with claudication bilateral Lower Extremities  Secondary Discharge Diagnosis History of renal transplant Done at [facility] on 01/23/2013. Follows Elmarie Shiley, MD locally. Acute on chronic renal failure, resolved probably due to metabolic derangements and contrast nephropathy Anemia of chronic disease, anemia due to blood loss from frequent blood draws. Hypertension Hyperlipidemia Diabetes mellitus type 2 controlled  Significant Diagnostic Studies: 10/06/2015: Procedure performed: Micropuncture left femoral arterial access, cross over from the left into the right femoral artery. Right femoral arteriogram. Right femoral arterial gram distal runoff. PTCA and stenting of the right external iliac artery. PTA and stenting of the right superficial femoral artery, and predilatation performed with a scoring balloon.  Angiographic data: Extremely difficult anatomy at the level of the internal and external iliac artery. The external iliac artery has high-grade stenosis, very slow sluggish flow was evident.  Right SFA showed tandem 80% stenosis, severe diffuse calcification of the entire lower exudative vessels was evident. Below the right knee there was one-vessel runoff in the form of anterior tibial artery, peroneal artery diffusely diseased but patent, posterior tibial artery occluded. At the level of the ankle there is minimal blood flow.  Interventional data: Successful but extremely difficult PTA and stenting of the right external iliac artery with implantation of a 6.0 x 39 mm Omnilink balloon expandable stent at 6 atmospheric pressure. The outflow of the stent had dissection and no flow, hence was overlapped with a 7.0 x 60 mm KeySpan self-expanding stent. The stent was postdilated with the previous stent balloon. Stenosis reduced from 90% to 0% throughout the right external iliac artery.  Successful scoring balloon angioplasty of the right SFA, the lesion was extremely high-grade, 80-90%, reduced to 0%. Stented with a 5.0 x 80 mm Innova self-expanding stent.  Post angioplasty right lower extremity runoff reveals two-vessel runoff with slow runoff in the peroneal artery due to high-grade proximal stenosis, brisk runoff in the anterior tibial artery.  ABI 10/08/15: RIGHT: ATA pressure non-compressible, most likley due to calcified vessels. PTA and Peroneal pressures most likely falsely elevated. Waveforms suggest severe arterial disease. Right toe waveform absent, could not obtain pressure.  LEFT: ATA and peroneal pressure non-compressible, most likely due to calcified vessels. Could not obtain PTA waveform. Waveforms suggest moderate arterial disease. Left TBI abnormal. Other specific details can be found in the table(s) above. Prepared and Electronically Authenticated by  Hospital Course: Patient and treated for elective peripheral arteriogram due to critical limb ischemia, patient had developed ischemic foot when she presented to the hospital. On 09/19/2015, she has had right common iliac artery angioplasty. Repeat coronary angiography the following morning after hydration due to renal insufficiency revealed the right external iliac artery to have very sluggish flow, very difficult anatomy and unable to show the appropriate bifurcation of internal and external iliac artery. Fortunately underwent successful angioplasty to the right external iliac artery sparing the anastomotic site of the renal transplant at the level of the right common and external iliac artery which also has a residual 50% stenosis. At the same time to improve perfusion to her right leg and to minimize amount of amputation that'll be needed, I  proceeded with angioplasty of the right mid SFA with scoring balloon antiplastic followed by stenting.  Following the arteriogram, patient's leg  pain has improved significantly. Over the next 2 days, patient had clear-cut demarcation at the level of the midfoot and instead of undergoing below-knee amputation, it was felt that she may be a good candidate for transmetatarsal amputation on an elective fashion  Dr. Meridee Score was also consulted and his opinion was greatly appreciated. Nephrology consultation with Dr. Jacolyn Reedy was also obtained regarding acute renal insufficiency.  Patient recuperated well with regard to acute renal failure, she did have anemia however did not need any blood transfusion as her hemoglobin remained stable. Patient was felt stable for discharge, arrangements made for her to see Dr. Meridee Score tomorrow followed by possible amputation to be scheduled the following day. Patient also appreciated the fact that she was going home, has good family support, mother lives with her.  Patient was on triple antiplatelet agents while in the hospital, and also she was started on argatroban and this helped with improving the perfusion. Nitropaste was also applied to her right foot.  Discharge Exam: Blood pressure 158/65, pulse 107, temperature 99.3 F (37.4 C), temperature source Oral, resp. rate 17, height 5' (1.524 m), weight 63.957 kg (141 lb), last menstrual period 09/24/2015, SpO2 100 %.   General appearance: alert, cooperative, appears stated age and no distress Eyes: negative findings: lids and lashes normal Neck: no adenopathy, no carotid bruit, no JVD, supple, symmetrical, trachea midline and thyroid not enlarged, symmetric, no tenderness/mass/nodules Neck: JVP - normal, carotids 2+= without bruits Resp: clear to auscultation bilaterally Chest wall: no tenderness Cardio: S1 and S2 is normal, there is a 1 to 2/6 systolic ejection murmur at the right sternal border.  No gallop. GI: soft, non-tender; bowel sounds normal; no masses, no organomegaly Extremities: right lower extremity,right foot, is gangrenous, dry gangrene. There is clear-cut demarcation at the level of the midfoot. The right leg itself is warm and nontender to touch. Very minimal edema is present. Other extremities are normal. Evidence of chronic arterial insufficiency is evident in the left leg with shiny skin and absent hair.  Arterial exam:  2+ bilateral femoral pulses without bruit.Popliteal pulsenot felt bilaterally, absent pedal pulses. Carotids normal.   Labs:   Lab Results  Component Value Date   WBC 2.5* 10/11/2015   HGB 7.4* 10/11/2015   HCT 23.8* 10/11/2015   MCV 75.1* 10/11/2015   PLT 385 10/11/2015    Recent Labs Lab 10/11/15 0251  NA 139  K 3.5  CL 102  CO2 26  BUN 9  CREATININE 1.11*  CALCIUM 8.4*  GLUCOSE 153*    FOLLOW UP PLANS AND APPOINTMENTS    Medication List    STOP taking these medications        cilostazol 100 MG tablet  Commonly known as:  PLETAL      TAKE these medications        albuterol 108 (90 Base) MCG/ACT inhaler  Commonly known as:  PROVENTIL HFA;VENTOLIN HFA  Inhale 1 puff into the lungs every 6 (six) hours as needed for wheezing or shortness of breath.     amLODipine 10 MG tablet  Commonly known as:  NORVASC  Take 10 mg by mouth at bedtime.     aspirin 81 MG EC tablet  Take 1 tablet (81 mg total) by mouth daily.     ferrous sulfate 325 (65 FE) MG tablet  Take 1 tablet (325 mg total) by mouth 3 (three) times daily with meals.     insulin aspart 100 UNIT/ML injection  Commonly known as:  novoLOG  Inject 4-14 Units into the skin 3 (three) times daily before meals. Per sliding scale: 100-150, 4 units. 151-200, 6 units. 201-250, 8 units. 251-300, 10 units. 301-350, 12 units. Over 350, 14 units.     insulin detemir 100 UNIT/ML injection  Commonly known as:  LEVEMIR  Inject 16-22 Units into the skin See admin instructions.  16 units in the morning and 22 units at bedtime     labetalol 300 MG tablet  Commonly known as:  NORMODYNE  Take 300 mg by mouth daily.     mycophenolate 180 MG EC tablet  Commonly known as:  MYFORTIC  Take 360-540 mg by mouth 2 (two) times daily. 540 mg in the morning and 360 mg at night     nitroGLYCERIN 0.2 mg/hr patch  Commonly known as:  NITRODUR - Dosed in mg/24 hr  Place 1 patch (0.2 mg total) onto the skin daily.     oxyCODONE-acetaminophen 5-325 MG tablet  Commonly known as:  PERCOCET/ROXICET  Take 1-2 tablets by mouth every 4 (four) hours as needed for severe pain.     pantoprazole 40 MG tablet  Commonly known as:  PROTONIX  Take 40 mg by mouth daily.     pravastatin 20 MG tablet  Commonly known as:  PRAVACHOL  Take 20 mg by mouth at bedtime.     sulfamethoxazole-trimethoprim 400-80 MG tablet  Commonly known as:  BACTRIM,SEPTRA  Take 1 tablet by mouth every Monday, Wednesday, and Friday.     tacrolimus 0.5 MG capsule  Commonly known as:  PROGRAF  Take 0.5 mg by mouth 2 (two) times daily.     tacrolimus 1 MG capsule  Commonly known as:  PROGRAF  Take 1.5 mg by mouth 2 (two) times daily.     Vitamin D (Ergocalciferol) 50000 units Caps capsule  Commonly known as:  DRISDOL  Take 50,000 Units by mouth every 7 (seven) days. Sundays           Follow-up Information    Follow up with DUDA,MARCUS V, MD In 3 days.   Specialty:  Orthopedic Surgery   Why:  evaluate for surgery friday   Contact information:   [place] [address] [phone]       Follow up with Newt Minion, MD.   Specialty:  Orthopedic Surgery   Why:  Their office will call you with appointment in 1-2 days.   Contact information:   [place] [address] [phone]       Follow up with Ulla Potash., MD.   Specialty:  Nephrology   Why:  Call to see him in 2-3 weeks for post hospital f/u.   Contact information:   [place]  [address] [phone]       Follow up with Adrian Prows, MD On 10/23/2015.   Specialty:  Cardiology   Why:  Bring all medications. Appointment time 2:45 pm   Contact information:   [place] [place] [address] [phone]        Adrian Prows, MD 10/11/2015, 8:52 AM  Pager: [phone] Office: [phone] If no answer: [phone]

## 2015-10-11 NOTE — Progress Notes (Signed)
Subjective:  Doing ok this AM. No complaints today. Plan to DC Objective Vital signs in last 24 hours: Filed Vitals:   10/10/15 1522 10/10/15 2129 10/11/15 0520 10/11/15 1034  BP: 134/61 160/70 158/65 171/68  Pulse: 95 92 107 105  Temp:  97.4 F (36.3 C) 99.3 F (37.4 C)   TempSrc:  Oral Oral   Resp: 20 17 17    Height:      Weight:      SpO2: 98% 96% 100%    Weight change:  No intake or output data in the 24 hours ending 10/11/15 1401  Assessment/ Plan: Pt is a 45 y.o. yo female who was admitted on 10/05/2015 with limb-threatening ischemia s/p several percutaneous interventions. She has a PMH of ESRD 2/2 poorly controlled DM and is s/p kidney transplant. Assessment/Plan: Assessment/Plan: 1. AKI in a transplant patient following IV contrasted intervention. Cr now improved to 1.19. Good UOP. (Follows w/ Dr. Posey Pronto as OP). Can DC tomorrow from renal standpoint. 2. Ischemic R foot s/p repeat percutaneous interventions with improvement of pain. For transmet amputation per ortho 3. Renal transplantation- continue with outpatient immunosuppressive regimen 4. Metabolic acidosis- resolved 5. HTN- stable 6. DM- on insulin 7. ABLA- may require blood transfusion. Continue to follow    Georges Lynch    Labs: Basic Metabolic Panel:  Recent Labs Lab 10/09/15 0318 10/10/15 0829 10/11/15 0251  NA 138 141 139  K 3.9 3.7 3.5  CL 111 107 102  CO2 15* 22 26  GLUCOSE 179* 121* 153*  BUN 17 11 9   CREATININE 1.49* 1.19* 1.11*  CALCIUM 8.9 8.5* 8.4*  PHOS  --  3.2 3.1   Liver Function Tests:  Recent Labs Lab 10/10/15 0829 10/11/15 0251  ALBUMIN 2.2* 2.0*   No results for input(s): LIPASE, AMYLASE in the last 168 hours. No results for input(s): AMMONIA in the last 168 hours. CBC:  Recent Labs Lab 10/05/15 1721 10/07/15 0209 10/08/15 0440 10/09/15 0318 10/09/15 1036 10/10/15 0829 10/11/15 0251  WBC 2.8* 3.0* 3.2* 3.3*  --   --  2.5*  HGB 9.7* 8.1* 8.4* 7.6* 7.7* 7.6*  7.4*  HCT 30.7* 26.8* 27.0* 24.8* 25.0* 24.7* 23.8*  MCV 76.6* 76.8* 76.3* 76.5*  --   --  75.1*  PLT 417* 380 382 374  --   --  385   Cardiac Enzymes: No results for input(s): CKTOTAL, CKMB, CKMBINDEX, TROPONINI in the last 168 hours. CBG:  Recent Labs Lab 10/10/15 0603 10/10/15 1119 10/10/15 2034 10/10/15 2058 10/11/15 0618  GLUCAP 110* 193* 27* 113* 100*    Iron Studies: No results for input(s): IRON, TIBC, TRANSFERRIN, FERRITIN in the last 72 hours. Studies/Results: No results found. Medications: Infusions: .  sodium bicarbonate 150 mEq in sterile water 1000 mL infusion 25 mL/hr at 10/10/15 [id number]    Scheduled Medications: . amLODipine  10 mg Oral QHS  . aspirin EC  81 mg Oral Daily  . cilostazol  100 mg Oral BID  . clopidogrel  75 mg Oral Daily  . ferrous sulfate  325 mg Oral TID WC  . insulin aspart  0-20 Units Subcutaneous TID WC  . insulin aspart  3 Units Subcutaneous TID WC  . insulin detemir  16 Units Subcutaneous Daily   And  . insulin detemir  22 Units Subcutaneous QHS  . labetalol  300 mg Oral Daily  . mycophenolate  540 mg Oral Daily   And  . mycophenolate  360 mg Oral QHS  . nitroGLYCERIN  0.2 mg Transdermal Daily  . pantoprazole  40 mg Oral Daily  . pravastatin  20 mg Oral QHS  . sodium chloride flush  3 mL Intravenous Q12H  . sulfamethoxazole-trimethoprim  1 tablet Oral Q M,W,F  . tacrolimus  1.5 mg Oral BID    have reviewed scheduled and prn medications.  Physical Exam: General: alert, cooperative and no distress Resp: clear to auscultation bilaterally Cardio: RRR, no murmur, click, rub or gallop Extremities: no edema, well demarcated dry gangrenous changes to right midfoot, RUE AVF +T/B   Elberta Leatherwood, MD,MS,  PGY2 10/11/2015 2:01 PM   LOS: 6 days     I have seen and examined this patient and agree with plan as outlined by Dr. Alease Frame.  Will arrange for follow up labs as an outpatient and follow up with Dr. Posey Pronto.  She is stable for  discharge to home from a renal standpoint. Broadus John A Hanin [place],MD 10/11/2015 2:08 PM

## 2015-10-11 NOTE — Care Management Note (Signed)
Case Management Note Marvetta Gibbons RN, BSN Unit 2W-Case Manager [phone]  Patient Details  Name: [name] MRN: [id number] Date of Birth: 06/16/1971  Subjective/Objective:   Pt admitted with critical lower limb ischemia - plan for OR on Friday-                    Action/Plan: PTA pt lived at home- plan to d/c home and return on Friday for surgery.   Expected Discharge Date:     10/11/15             Expected Discharge Plan:  [place]  In-House Referral:     Discharge planning Services  CM Consult  Post Acute Care Choice:    Choice offered to:     DME Arranged:    DME Agency:     HH Arranged:    [place] Agency:     Status of Service:  Completed, signed off  Medicare Important Message Given:  Yes Date Medicare IM Given:    Medicare IM give by:    Date Additional Medicare IM Given:    Additional Medicare Important Message give by:     If discussed at [place] of Stay Meetings, dates discussed:    Additional Comments:  Dawayne Patricia, RN 10/11/2015, 10:22 AM

## 2015-10-12 ENCOUNTER — Encounter (HOSPITAL_COMMUNITY): Payer: Self-pay | Admitting: *Deleted

## 2015-10-12 ENCOUNTER — Other Ambulatory Visit (HOSPITAL_COMMUNITY): Payer: Self-pay | Admitting: Family

## 2015-10-12 DIAGNOSIS — I70261 Atherosclerosis of native arteries of extremities with gangrene, right leg: Secondary | ICD-10-CM | POA: Diagnosis not present

## 2015-10-12 DIAGNOSIS — E1142 Type 2 diabetes mellitus with diabetic polyneuropathy: Secondary | ICD-10-CM | POA: Diagnosis not present

## 2015-10-12 NOTE — Progress Notes (Signed)
Pt denies cardiac history, chest pain or sob. Pt has had a kidney transplant, still has a fistula in right arm. No longer on dialysis. Pt is diabetic. Fasting blood sugar run between 68-110.  Pt instructed to take 1/2 of regular dose of Levemir tonight (11 units instead of 22 units) and 1/2 of regular dose of Levemir in the AM (8 units instead of 16 units). Also instructed her to check her blood sugar every 2 hours upon waking up until she leaves to come to the hospital for surgery. If blood sugar is >220, instructed pt to use 1/2 of sliding scale Novolog insulin dose. If blood sugar is 70 or below instructed pt to treat with 1/2 cup of apple or cranberry juice and to recheck blood sugar 15 minutes after drinking juice. If blood sugar is still 70 or below, instructed pt to call Short Stay and speak with a nurse. These instructions given per Diabetes Medication Adjustment Guidelines Prior to Procedure and Surgery. Pt voiced understanding.

## 2015-10-13 ENCOUNTER — Other Ambulatory Visit: Payer: Self-pay

## 2015-10-13 ENCOUNTER — Encounter (HOSPITAL_COMMUNITY): Payer: Self-pay | Admitting: *Deleted

## 2015-10-13 ENCOUNTER — Ambulatory Visit (HOSPITAL_COMMUNITY): Payer: Medicare Other | Admitting: Anesthesiology

## 2015-10-13 ENCOUNTER — Ambulatory Visit (HOSPITAL_COMMUNITY)
Admission: RE | Admit: 2015-10-13 | Discharge: 2015-10-18 | Disposition: A | Discharge status: Skilled Nursing Facility | Payer: Medicare Other | Source: Ambulatory Visit | Attending: Internal Medicine | Admitting: Internal Medicine

## 2015-10-13 ENCOUNTER — Encounter (HOSPITAL_COMMUNITY)
Admission: RE | Disposition: A | Discharge status: Skilled Nursing Facility | Payer: Self-pay | Source: Ambulatory Visit | Attending: Internal Medicine

## 2015-10-13 DIAGNOSIS — N184 Chronic kidney disease, stage 4 (severe): Secondary | ICD-10-CM | POA: Insufficient documentation

## 2015-10-13 DIAGNOSIS — Z955 Presence of coronary angioplasty implant and graft: Secondary | ICD-10-CM | POA: Diagnosis not present

## 2015-10-13 DIAGNOSIS — E1142 Type 2 diabetes mellitus with diabetic polyneuropathy: Secondary | ICD-10-CM | POA: Insufficient documentation

## 2015-10-13 DIAGNOSIS — D62 Acute posthemorrhagic anemia: Secondary | ICD-10-CM | POA: Diagnosis not present

## 2015-10-13 DIAGNOSIS — I3139 Other pericardial effusion (noninflammatory): Secondary | ICD-10-CM | POA: Insufficient documentation

## 2015-10-13 DIAGNOSIS — Z95820 Peripheral vascular angioplasty status with implants and grafts: Secondary | ICD-10-CM | POA: Diagnosis not present

## 2015-10-13 DIAGNOSIS — Z89511 Acquired absence of right leg below knee: Secondary | ICD-10-CM | POA: Insufficient documentation

## 2015-10-13 DIAGNOSIS — N189 Chronic kidney disease, unspecified: Secondary | ICD-10-CM

## 2015-10-13 DIAGNOSIS — Z7982 Long term (current) use of aspirin: Secondary | ICD-10-CM | POA: Insufficient documentation

## 2015-10-13 DIAGNOSIS — D631 Anemia in chronic kidney disease: Secondary | ICD-10-CM | POA: Insufficient documentation

## 2015-10-13 DIAGNOSIS — D72819 Decreased white blood cell count, unspecified: Secondary | ICD-10-CM | POA: Insufficient documentation

## 2015-10-13 DIAGNOSIS — E1140 Type 2 diabetes mellitus with diabetic neuropathy, unspecified: Secondary | ICD-10-CM | POA: Diagnosis not present

## 2015-10-13 DIAGNOSIS — R0902 Hypoxemia: Secondary | ICD-10-CM | POA: Insufficient documentation

## 2015-10-13 DIAGNOSIS — I469 Cardiac arrest, cause unspecified: Secondary | ICD-10-CM | POA: Insufficient documentation

## 2015-10-13 DIAGNOSIS — E1122 Type 2 diabetes mellitus with diabetic chronic kidney disease: Secondary | ICD-10-CM | POA: Diagnosis not present

## 2015-10-13 DIAGNOSIS — L97519 Non-pressure chronic ulcer of other part of right foot with unspecified severity: Secondary | ICD-10-CM | POA: Diagnosis not present

## 2015-10-13 DIAGNOSIS — I70261 Atherosclerosis of native arteries of extremities with gangrene, right leg: Secondary | ICD-10-CM | POA: Insufficient documentation

## 2015-10-13 DIAGNOSIS — D649 Anemia, unspecified: Secondary | ICD-10-CM | POA: Diagnosis not present

## 2015-10-13 DIAGNOSIS — N186 End stage renal disease: Secondary | ICD-10-CM | POA: Diagnosis not present

## 2015-10-13 DIAGNOSIS — I313 Pericardial effusion (noninflammatory): Secondary | ICD-10-CM | POA: Insufficient documentation

## 2015-10-13 DIAGNOSIS — Z940 Kidney transplant status: Secondary | ICD-10-CM | POA: Insufficient documentation

## 2015-10-13 DIAGNOSIS — E1165 Type 2 diabetes mellitus with hyperglycemia: Secondary | ICD-10-CM

## 2015-10-13 DIAGNOSIS — E1151 Type 2 diabetes mellitus with diabetic peripheral angiopathy without gangrene: Secondary | ICD-10-CM

## 2015-10-13 DIAGNOSIS — IMO0002 Reserved for concepts with insufficient information to code with codable children: Secondary | ICD-10-CM

## 2015-10-13 DIAGNOSIS — I10 Essential (primary) hypertension: Secondary | ICD-10-CM | POA: Insufficient documentation

## 2015-10-13 DIAGNOSIS — I132 Hypertensive heart and chronic kidney disease with heart failure and with stage 5 chronic kidney disease, or end stage renal disease: Secondary | ICD-10-CM | POA: Insufficient documentation

## 2015-10-13 DIAGNOSIS — I96 Gangrene, not elsewhere classified: Secondary | ICD-10-CM | POA: Diagnosis not present

## 2015-10-13 DIAGNOSIS — Z89519 Acquired absence of unspecified leg below knee: Secondary | ICD-10-CM | POA: Insufficient documentation

## 2015-10-13 DIAGNOSIS — E1152 Type 2 diabetes mellitus with diabetic peripheral angiopathy with gangrene: Secondary | ICD-10-CM | POA: Diagnosis present

## 2015-10-13 DIAGNOSIS — I509 Heart failure, unspecified: Secondary | ICD-10-CM | POA: Diagnosis not present

## 2015-10-13 HISTORY — PX: AMPUTATION: SHX166

## 2015-10-13 LAB — POCT I-STAT 4, (NA,K, GLUC, HGB,HCT)
Glucose, Bld: 123 mg/dL — ABNORMAL HIGH (ref 65–99)
HCT: 26 % — ABNORMAL LOW (ref 36.0–46.0)
Hemoglobin: 8.8 g/dL — ABNORMAL LOW (ref 12.0–15.0)
Potassium: 3.9 mmol/L (ref 3.5–5.1)
Sodium: 138 mmol/L (ref 135–145)

## 2015-10-13 LAB — HCG, SERUM, QUALITATIVE: Preg, Serum: NEGATIVE

## 2015-10-13 LAB — GLUCOSE, CAPILLARY
Glucose-Capillary: 125 mg/dL — ABNORMAL HIGH (ref 65–99)
Glucose-Capillary: 193 mg/dL — ABNORMAL HIGH (ref 65–99)

## 2015-10-13 SURGERY — AMPUTATION, FOOT, RAY
Anesthesia: General | Site: Foot | Laterality: Right

## 2015-10-13 MED ORDER — HYDROMORPHONE HCL 1 MG/ML IJ SOLN
1.0000 mg | INTRAMUSCULAR | Status: DC | PRN
  Administered 2015-10-13 – 2015-10-14 (×3): 1 mg via INTRAVENOUS
  Filled 2015-10-13 (×3): qty 1

## 2015-10-13 MED ORDER — HYDROMORPHONE HCL 1 MG/ML IJ SOLN
INTRAMUSCULAR | Status: AC
  Administered 2015-10-13: 0.5 mg via INTRAVENOUS
  Filled 2015-10-13: qty 1

## 2015-10-13 MED ORDER — MYCOPHENOLATE SODIUM 180 MG PO TBEC
360.0000 mg | DELAYED_RELEASE_TABLET | Freq: Every day | ORAL | Status: DC
  Administered 2015-10-13 – 2015-10-17 (×5): 360 mg via ORAL
  Filled 2015-10-13 (×6): qty 2

## 2015-10-13 MED ORDER — FENTANYL CITRATE (PF) 250 MCG/5ML IJ SOLN
INTRAMUSCULAR | Status: AC
  Filled 2015-10-13: qty 5

## 2015-10-13 MED ORDER — OXYCODONE HCL 5 MG PO TABS
ORAL_TABLET | ORAL | Status: AC
  Filled 2015-10-13: qty 1

## 2015-10-13 MED ORDER — METHOCARBAMOL 1000 MG/10ML IJ SOLN
500.0000 mg | Freq: Four times a day (QID) | INTRAMUSCULAR | Status: DC | PRN
  Filled 2015-10-13: qty 5

## 2015-10-13 MED ORDER — PROPOFOL 10 MG/ML IV BOLUS
INTRAVENOUS | Status: AC
  Filled 2015-10-13: qty 20

## 2015-10-13 MED ORDER — 0.9 % SODIUM CHLORIDE (POUR BTL) OPTIME
TOPICAL | Status: DC | PRN
  Administered 2015-10-13: 1000 mL

## 2015-10-13 MED ORDER — PRAVASTATIN SODIUM 20 MG PO TABS
20.0000 mg | ORAL_TABLET | Freq: Every day | ORAL | Status: DC
  Administered 2015-10-13 – 2015-10-17 (×5): 20 mg via ORAL
  Filled 2015-10-13 (×6): qty 1

## 2015-10-13 MED ORDER — ACETAMINOPHEN 325 MG PO TABS
650.0000 mg | ORAL_TABLET | Freq: Four times a day (QID) | ORAL | Status: DC | PRN
  Administered 2015-10-14 – 2015-10-17 (×6): 650 mg via ORAL
  Filled 2015-10-13 (×6): qty 2

## 2015-10-13 MED ORDER — METOCLOPRAMIDE HCL 5 MG/ML IJ SOLN: 5.0000 mg | Freq: Three times a day (TID) | INTRAMUSCULAR | Status: DC | PRN

## 2015-10-13 MED ORDER — SODIUM CHLORIDE 0.9 % IV SOLN
INTRAVENOUS | Status: DC
  Administered 2015-10-13: 17:00:00 via INTRAVENOUS

## 2015-10-13 MED ORDER — CEFAZOLIN SODIUM 1-5 GM-% IV SOLN
1.0000 g | Freq: Four times a day (QID) | INTRAVENOUS | Status: AC
Start: 2015-10-14 — End: 2015-10-14
  Administered 2015-10-14 (×2): 1 g via INTRAVENOUS
  Filled 2015-10-13 (×3): qty 50

## 2015-10-13 MED ORDER — TACROLIMUS 1 MG PO CAPS
1.5000 mg | ORAL_CAPSULE | Freq: Two times a day (BID) | ORAL | Status: DC
  Administered 2015-10-13 – 2015-10-18 (×10): 1.5 mg via ORAL
  Filled 2015-10-13 (×12): qty 1

## 2015-10-13 MED ORDER — TACROLIMUS 0.5 MG PO CAPS: 0.5000 mg | ORAL_CAPSULE | Freq: Two times a day (BID) | ORAL | Status: DC

## 2015-10-13 MED ORDER — OXYCODONE HCL 5 MG PO TABS
5.0000 mg | ORAL_TABLET | ORAL | Status: DC | PRN
  Administered 2015-10-13 – 2015-10-14 (×4): 10 mg via ORAL
  Filled 2015-10-13 (×3): qty 2

## 2015-10-13 MED ORDER — PROPOFOL 10 MG/ML IV BOLUS
INTRAVENOUS | Status: DC | PRN
  Administered 2015-10-13: 130 mg via INTRAVENOUS

## 2015-10-13 MED ORDER — SODIUM CHLORIDE 0.9 % IV SOLN
INTRAVENOUS | Status: DC
  Administered 2015-10-13: 21:00:00 via INTRAVENOUS

## 2015-10-13 MED ORDER — FENTANYL CITRATE (PF) 100 MCG/2ML IJ SOLN
INTRAMUSCULAR | Status: DC | PRN
  Administered 2015-10-13 (×5): 50 ug via INTRAVENOUS

## 2015-10-13 MED ORDER — ONDANSETRON HCL 4 MG/2ML IJ SOLN
INTRAMUSCULAR | Status: DC | PRN
  Administered 2015-10-13: 4 mg via INTRAVENOUS

## 2015-10-13 MED ORDER — CEFAZOLIN SODIUM-DEXTROSE 2-4 GM/100ML-% IV SOLN
2.0000 g | INTRAVENOUS | Status: AC
  Administered 2015-10-13: 2 g via INTRAVENOUS

## 2015-10-13 MED ORDER — ONDANSETRON HCL 4 MG/2ML IJ SOLN: 4.0000 mg | Freq: Once | INTRAMUSCULAR | Status: DC | PRN

## 2015-10-13 MED ORDER — LABETALOL HCL 100 MG PO TABS
300.0000 mg | ORAL_TABLET | Freq: Every day | ORAL | Status: DC
Start: 2015-10-13 — End: 2015-10-18
  Administered 2015-10-13 – 2015-10-17 (×5): 300 mg via ORAL
  Filled 2015-10-13 (×5): qty 1

## 2015-10-13 MED ORDER — ALBUTEROL SULFATE (2.5 MG/3ML) 0.083% IN NEBU: 2.5000 mg | INHALATION_SOLUTION | Freq: Four times a day (QID) | RESPIRATORY_TRACT | Status: DC | PRN

## 2015-10-13 MED ORDER — ASPIRIN EC 81 MG PO TBEC
81.0000 mg | DELAYED_RELEASE_TABLET | Freq: Every day | ORAL | Status: DC
  Administered 2015-10-13 – 2015-10-18 (×6): 81 mg via ORAL
  Filled 2015-10-13 (×6): qty 1

## 2015-10-13 MED ORDER — ONDANSETRON HCL 4 MG/2ML IJ SOLN: 4.0000 mg | Freq: Four times a day (QID) | INTRAMUSCULAR | Status: DC | PRN

## 2015-10-13 MED ORDER — ACETAMINOPHEN 650 MG RE SUPP: 650.0000 mg | Freq: Four times a day (QID) | RECTAL | Status: DC | PRN

## 2015-10-13 MED ORDER — LIDOCAINE HCL (CARDIAC) 20 MG/ML IV SOLN
INTRAVENOUS | Status: DC | PRN
  Administered 2015-10-13: 60 mg via INTRAVENOUS

## 2015-10-13 MED ORDER — OXYCODONE-ACETAMINOPHEN 5-325 MG PO TABS: 1.0000 | ORAL_TABLET | ORAL | Status: DC | PRN

## 2015-10-13 MED ORDER — OXYCODONE HCL 5 MG PO TABS: 5.0000 mg | ORAL_TABLET | Freq: Once | ORAL | Status: DC | PRN

## 2015-10-13 MED ORDER — METHOCARBAMOL 500 MG PO TABS
500.0000 mg | ORAL_TABLET | Freq: Four times a day (QID) | ORAL | Status: DC | PRN
  Administered 2015-10-13 – 2015-10-14 (×3): 500 mg via ORAL
  Filled 2015-10-13 (×3): qty 1

## 2015-10-13 MED ORDER — MIDAZOLAM HCL 5 MG/5ML IJ SOLN
INTRAMUSCULAR | Status: DC | PRN
  Administered 2015-10-13: 2 mg via INTRAVENOUS

## 2015-10-13 MED ORDER — AMLODIPINE BESYLATE 10 MG PO TABS
10.0000 mg | ORAL_TABLET | Freq: Every day | ORAL | Status: DC
  Administered 2015-10-13 – 2015-10-17 (×5): 10 mg via ORAL
  Filled 2015-10-13 (×6): qty 1

## 2015-10-13 MED ORDER — CHLORHEXIDINE GLUCONATE 4 % EX LIQD: 60.0000 mL | Freq: Once | CUTANEOUS | Status: DC

## 2015-10-13 MED ORDER — ONDANSETRON HCL 4 MG PO TABS: 4.0000 mg | ORAL_TABLET | Freq: Four times a day (QID) | ORAL | Status: DC | PRN

## 2015-10-13 MED ORDER — CEFAZOLIN SODIUM-DEXTROSE 2-4 GM/100ML-% IV SOLN
INTRAVENOUS | Status: AC
  Filled 2015-10-13: qty 100

## 2015-10-13 MED ORDER — MIDAZOLAM HCL 2 MG/2ML IJ SOLN
INTRAMUSCULAR | Status: AC
  Filled 2015-10-13: qty 2

## 2015-10-13 MED ORDER — METOCLOPRAMIDE HCL 5 MG PO TABS
5.0000 mg | ORAL_TABLET | Freq: Three times a day (TID) | ORAL | Status: DC | PRN
  Filled 2015-10-13: qty 2

## 2015-10-13 MED ORDER — PANTOPRAZOLE SODIUM 40 MG PO TBEC
40.0000 mg | DELAYED_RELEASE_TABLET | Freq: Every day | ORAL | Status: DC
  Administered 2015-10-13 – 2015-10-18 (×6): 40 mg via ORAL
  Filled 2015-10-13 (×6): qty 1

## 2015-10-13 MED ORDER — HYDROMORPHONE HCL 1 MG/ML IJ SOLN
0.2500 mg | INTRAMUSCULAR | Status: DC | PRN
  Administered 2015-10-13 (×2): 0.5 mg via INTRAVENOUS

## 2015-10-13 MED ORDER — OXYCODONE HCL 5 MG/5ML PO SOLN: 5.0000 mg | Freq: Once | ORAL | Status: DC | PRN

## 2015-10-13 MED ORDER — MYCOPHENOLATE SODIUM 180 MG PO TBEC
540.0000 mg | DELAYED_RELEASE_TABLET | Freq: Every day | ORAL | Status: DC
  Administered 2015-10-14 – 2015-10-18 (×5): 540 mg via ORAL
  Filled 2015-10-13 (×5): qty 3

## 2015-10-13 SURGICAL SUPPLY — 32 items
BLADE SAW SGTL MED 73X18.5 STR (BLADE) ×1 IMPLANT
BNDG COHESIVE 4X5 TAN STRL (GAUZE/BANDAGES/DRESSINGS) ×2 IMPLANT
BNDG GAUZE ELAST 4 BULKY (GAUZE/BANDAGES/DRESSINGS) ×2 IMPLANT
COVER SURGICAL LIGHT HANDLE (MISCELLANEOUS) ×4 IMPLANT
DRAPE INCISE IOBAN 66X45 STRL (DRAPES) ×1 IMPLANT
DRAPE U-SHAPE 47X51 STRL (DRAPES) ×4 IMPLANT
DRSG ADAPTIC 3X8 NADH LF (GAUZE/BANDAGES/DRESSINGS) ×2 IMPLANT
DRSG PAD ABDOMINAL 8X10 ST (GAUZE/BANDAGES/DRESSINGS) ×4 IMPLANT
DURAPREP 26ML APPLICATOR (WOUND CARE) ×2 IMPLANT
ELECT REM PT RETURN 9FT ADLT (ELECTROSURGICAL) ×2
ELECTRODE REM PT RTRN 9FT ADLT (ELECTROSURGICAL) ×1 IMPLANT
GAUZE SPONGE 4X4 12PLY STRL (GAUZE/BANDAGES/DRESSINGS) ×2 IMPLANT
GLOVE BIOGEL PI IND STRL 9 (GLOVE) ×1 IMPLANT
GLOVE BIOGEL PI INDICATOR 9 (GLOVE) ×1
GLOVE SURG ORTHO 9.0 STRL STRW (GLOVE) ×2 IMPLANT
GOWN STRL REUS W/ TWL LRG LVL3 (GOWN DISPOSABLE) ×1 IMPLANT
GOWN STRL REUS W/ TWL XL LVL3 (GOWN DISPOSABLE) ×2 IMPLANT
GOWN STRL REUS W/TWL LRG LVL3 (GOWN DISPOSABLE) ×2
GOWN STRL REUS W/TWL XL LVL3 (GOWN DISPOSABLE) ×4
KIT BASIN OR (CUSTOM PROCEDURE TRAY) ×2 IMPLANT
KIT PREVENA INCISION MGT 13 (CANNISTER) ×1 IMPLANT
KIT ROOM TURNOVER OR (KITS) ×2 IMPLANT
NS IRRIG 1000ML POUR BTL (IV SOLUTION) ×2 IMPLANT
PACK ORTHO EXTREMITY (CUSTOM PROCEDURE TRAY) ×2 IMPLANT
PAD ARMBOARD 7.5X6 YLW CONV (MISCELLANEOUS) ×4 IMPLANT
SPONGE LAP 18X18 X RAY DECT (DISPOSABLE) ×2 IMPLANT
STOCKINETTE IMPERVIOUS LG (DRAPES) ×1 IMPLANT
SUT ETHILON 2 0 PSLX (SUTURE) ×4 IMPLANT
TOWEL OR 17X24 6PK STRL BLUE (TOWEL DISPOSABLE) ×2 IMPLANT
TOWEL OR 17X26 10 PK STRL BLUE (TOWEL DISPOSABLE) ×2 IMPLANT
UNDERPAD 30X30 INCONTINENT (UNDERPADS AND DIAPERS) ×2 IMPLANT
WATER STERILE IRR 1000ML POUR (IV SOLUTION) ×2 IMPLANT

## 2015-10-13 NOTE — Anesthesia Postprocedure Evaluation (Signed)
Anesthesia Post Note  Patient: [name]  Procedure(s) Performed: Procedure(s) (LRB): Right Transmetatarsal Amputation (Right)  Patient location during evaluation: PACU Anesthesia Type: General Level of consciousness: awake Pain management: pain level controlled Vital Signs Assessment: post-procedure vital signs reviewed and stable Respiratory status: spontaneous breathing Cardiovascular status: stable Postop Assessment: no signs of nausea or vomiting Anesthetic complications: no    Last Vitals:  Filed Vitals:   10/13/15 1945 10/13/15 2100  BP: 172/86 164/84  Pulse: 99 100  Temp: 36.9 C 36.8 C  Resp: 10 16    Last Pain:  Filed Vitals:   10/13/15 2254  PainSc: 5                  Enis Leatherwood

## 2015-10-13 NOTE — Anesthesia Preprocedure Evaluation (Addendum)
Anesthesia Evaluation  Patient identified by MRN, date of birth, ID band Patient awake    Reviewed: Allergy & Precautions, H&P , NPO status , Patient's Chart, lab work & pertinent test results  History of Anesthesia Complications Negative for: history of anesthetic complications  Airway Mallampati: II  TM Distance: >3 FB Neck ROM: full    Dental no notable dental hx.    Pulmonary neg pulmonary ROS,    Pulmonary exam normal breath sounds clear to auscultation       Cardiovascular hypertension, + Peripheral Vascular Disease and +CHF  Normal cardiovascular exam Rhythm:regular Rate:Normal     Neuro/Psych negative neurological ROS     GI/Hepatic negative GI ROS, Neg liver ROS,   Endo/Other  diabetes, Poorly Controlled  Renal/GU Renal diseasePatient has had a renal transplant     Musculoskeletal   Abdominal   Peds  Hematology  (+) anemia ,   Anesthesia Other Findings Takes tacrolimus for renal transplant  Denies active chest pain, shortness of breath or dyspnea with exertion  Reproductive/Obstetrics negative OB ROS                            Anesthesia Physical Anesthesia Plan  ASA: III  Anesthesia Plan: General   Post-op Pain Management:    Induction: Intravenous  Airway Management Planned: LMA  Additional Equipment:   Intra-op Plan:   Post-operative Plan:   Informed Consent: I have reviewed the patients History and Physical, chart, labs and discussed the procedure including the risks, benefits and alternatives for the proposed anesthesia with the patient or authorized representative who has indicated his/her understanding and acceptance.   Dental Advisory Given  Plan Discussed with: Anesthesiologist, CRNA and Surgeon  Anesthesia Plan Comments:         Anesthesia Quick Evaluation

## 2015-10-13 NOTE — Anesthesia Procedure Notes (Signed)
Procedure Name: LMA Insertion Date/Time: 10/13/2015 6:09 PM Performed by: Lance Coon Pre-anesthesia Checklist: Patient identified, Emergency Drugs available, Suction available, Patient being monitored and Timeout performed Patient Re-evaluated:Patient Re-evaluated prior to inductionOxygen Delivery Method: Circle system utilized Preoxygenation: Pre-oxygenation with 100% oxygen Intubation Type: IV induction LMA: LMA inserted LMA Size: 4.0 Number of attempts: 1 Tube secured with: Tape Dental Injury: Teeth and Oropharynx as per pre-operative assessment

## 2015-10-13 NOTE — Progress Notes (Signed)
Orthopedic Tech Progress Note Patient Details:  [name] [birth year] [id number] Fit pt. for CAM walker.  Left CAM walker in pt.'s room for use in the morning. Ortho Devices Type of Ortho Device: CAM walker Ortho Device/Splint Interventions: Other (comment)   Darrol Poke 10/13/2015, 10:07 PM

## 2015-10-13 NOTE — Transfer of Care (Signed)
Immediate Anesthesia Transfer of Care Note  Patient: [name]  Procedure(s) Performed: Procedure(s): Right Transmetatarsal Amputation (Right)  Patient Location: PACU  Anesthesia Type:General  Level of Consciousness: awake, alert , oriented and patient cooperative  Airway & Oxygen Therapy: Patient Spontanous Breathing and Patient connected to nasal cannula oxygen  Post-op Assessment: Report given to RN, Post -op Vital signs reviewed and stable and Patient moving all extremities X 4  Post vital signs: Reviewed and stable  Last Vitals:  Filed Vitals:   10/13/15 1708 10/13/15 1855  BP: 186/73 183/88  Pulse: 104 102  Temp: 37.1 C   Resp: 18 11    Last Pain:  Filed Vitals:   10/13/15 1856  PainSc: 5       Patients Stated Pain Goal: 3 ([id number])  Complications: No apparent anesthesia complications

## 2015-10-13 NOTE — Op Note (Signed)
10/13/2015  6:40 PM  PATIENT:  [name]    PRE-OPERATIVE DIAGNOSIS:  Gangrene Right Foot  POST-OPERATIVE DIAGNOSIS:  Same  PROCEDURE:  Right chopart Amputation Application Prevena wound VAC  SURGEON:  Veona Bittman V, MD  PHYSICIAN ASSISTANT:None ANESTHESIA:   General  PREOPERATIVE INDICATIONS:  [name] is a  45 y.o. female with a diagnosis of Gangrene Right Foot who failed conservative measures and elected for surgical management.    The risks benefits and alternatives were discussed with the patient preoperatively including but not limited to the risks of infection, bleeding, nerve injury, cardiopulmonary complications, the need for revision surgery, among others, and the patient was willing to proceed.  OPERATIVE IMPLANTS: none  OPERATIVE FINDINGS: all vessels to the midfoot were clotted with acute thrombus midfoot and forefoot had dry gangrene  OPERATIVE PROCEDURE: patient is brought the operating room and underwent a general anesthetic. After adequate levels anesthesia were obtained patient's right lower extremity was prepped using DuraPrep draped into a sterile field. A timeout was called. A fishmouth incision was made through the midfoot just proximal to the ischemic gangrenous  Margination dorsally and extending into the ischemic area plantarly. A Chopart amputation was performed. The necrotic muscle was debrided and removed with a Ronjair. All vessels were clotted with acute thrombus. The incision was closed using 2-0 nylon. A Prevena wound VAC was applied patient was extubated taken to the PACU in stable condition.  Plan for overnight observation prescription for Percocet provided she may be touchdown weightbearing on the right. Patient to be discharged with the wound VAC.

## 2015-10-13 NOTE — H&P (Signed)
[name] is an 45 y.o. female.   Chief Complaint: Dry gangrene right foot HPI: Patient is a 45 year old woman with diabetic insensate neuropathy end-stage renal disease on dialysis with severe peripheral vascular disease status post peripheral vascular intervention for limb salvage right lower extremity. Patient developed a thrombotic event with dry gangrene of the forefoot and midfoot on the right.  Past Medical History  Diagnosis Date  . Retinopathy   . Metabolic bone disease   . Anemia   . CHF (congestive heart failure) ([place])   . History of blood transfusion     related to "kidney transplant"  . Hypertension     sees Dr. Carolin Guernsey  . High cholesterol   . ESRD (end stage renal disease) on dialysis ([place]) 09/28/2011-01/23/2013    M-W-F; [place]  . Pneumonia 09/27/2011  . Type II diabetes mellitus ([place])     "controlled with diet"  . Critical ischemia of lower extremity hospitalized 10/05/2015    right  . Peripheral vascular disease ([place])   . Diabetic neuropathy [facility])     Past Surgical History  Procedure Laterality Date  . Insertion of dialysis catheter  10/04/2011    Procedure: INSERTION OF DIALYSIS CATHETER;  Surgeon: Angelia Mould, MD;  Location: [place];  Service: Vascular;  Laterality: Right;  insertion of dialysis catheter right internal jugular  . Av fistula placement  10/04/2011    Procedure: INSERTION OF ARTERIOVENOUS (AV) GORE-TEX GRAFT ARM;  Surgeon: Angelia Mould, MD;  Location: [place];  Service: Vascular;  Laterality: Left;  Insertion left upper arm Arteriovenous goretex graft  . Avgg removal  10/04/2011    Procedure: REMOVAL OF ARTERIOVENOUS GORETEX GRAFT ([place]);  Surgeon: Elam Dutch, MD;  Location: [place];  Service: Vascular;  Laterality: Left;  . Av fistula placement  10/29/2011    Procedure: ARTERIOVENOUS (AV) FISTULA CREATION;  Surgeon: Angelia Mould, MD;  Location: [facility] OR;  Service: Vascular;  Laterality: Right;  Creation Right  Arteriovenous Fistula   . Revison of arteriovenous fistula  06/23/2012    Procedure: REVISON OF ARTERIOVENOUS FISTULA;  Surgeon: Angelia Mould, MD;  Location: [place];  Service: Vascular;  Laterality: Right;  Ultrasound guided  . Insertion of dialysis catheter  06/23/2012    Procedure: INSERTION OF DIALYSIS CATHETER;  Surgeon: Angelia Mould, MD;  Location: [place];  Service: Vascular;  Laterality: N/A;  Ultrasound guided  . Patch angioplasty  06/23/2012    Procedure: PATCH ANGIOPLASTY;  Surgeon: Angelia Mould, MD;  Location: [facility] OR;  Service: Vascular;  Laterality: Right;  . Esophagogastroduodenoscopy N/A 12/24/2012    Procedure: ESOPHAGOGASTRODUODENOSCOPY (EGD);  Surgeon: Milus Banister, MD;  Location: [place];  Service: Endoscopy;  Laterality: N/A;  . Kidney transplant  01/24/2013  . Finger amputation Right 02/26/2013    "3rd finger"  . Shuntogram N/A 04/06/2012    Procedure: Earney Mallet;  Surgeon: Angelia Mould, MD;  Location: [facility] CATH LAB;  Service: Cardiovascular;  Laterality: N/A;  . Peripheral vascular catheterization N/A 09/19/2015    Procedure: Lower Extremity Angiography;  Surgeon: Adrian Prows, MD;  Location: [place] CV LAB;  Service: Cardiovascular;  Laterality: N/A;  . Peripheral vascular catheterization N/A 09/19/2015    Procedure: Abdominal Aortogram;  Surgeon: Adrian Prows, MD;  Location: [place] CV LAB;  Service: Cardiovascular;  Laterality: N/A;  . Peripheral vascular catheterization Right 09/19/2015    Procedure: Peripheral Vascular Intervention;  Surgeon: Adrian Prows, MD;  Location: [place] CV LAB;  Service: Cardiovascular;  Laterality: Right;  Right Common  Iliac  . Peripheral vascular catheterization N/A 10/06/2015    Procedure: Lower Extremity Angiography;  Surgeon: Adrian Prows, MD;  Location: [place] CV LAB;  Service: Cardiovascular;  Laterality: N/A;  . Peripheral vascular catheterization  10/06/2015    Procedure: Peripheral Vascular Intervention;   Surgeon: Adrian Prows, MD;  Location: [place] CV LAB;  Service: Cardiovascular;;  REIA  Omnilink 6.0x29, innova 7x80  RSFA innova 5x80    Family History  Problem Relation Age of Onset  . Malignant hyperthermia Mother   . Malignant hyperthermia Father   . Anesthesia problems Neg Hx   . Thyroid disease Mother   . Diabetes Brother   . Heart disease Brother     CHF  . Hypertension Mother   . Hypertension Father    Social History:  reports that she has never smoked. She has never used smokeless tobacco. She reports that she does not drink alcohol or use illicit drugs.  Allergies: No Known Allergies  No prescriptions prior to admission    Results for orders placed or performed during the hospital encounter of 10/05/15 (from the past 48 hour(s))  Glucose, capillary     Status: Abnormal   Collection Time: 10/11/15  6:18 AM  Result Value Ref Range   Glucose-Capillary 100 (H) 65 - 99 mg/dL   Comment 1 Notify RN    Comment 2 Document in Chart    No results found.  Review of Systems  All other systems reviewed and are negative.   Last menstrual period 09/24/2015. Physical Exam  On examination patient has dry gangrene of her toes with ischemic changes to the midfoot. Patient has a monophasic dopplerable dorsalis pedis pulse. Assessment/Plan Assessment: Dry gangrene right forefoot and midfoot.  Plan: We will plan for attempted limb salvage with a transmetatarsal amputation. Discussed with the patient there is an increased risk of the wound not healing increased risk of requiring a transtibial amputation. Patient states she understands wishes to proceed with limb salvage at this time.  Newt Minion, MD 10/13/2015, 6:11 AM

## 2015-10-14 ENCOUNTER — Other Ambulatory Visit: Payer: Self-pay

## 2015-10-14 ENCOUNTER — Ambulatory Visit (HOSPITAL_COMMUNITY): Payer: Medicare Other

## 2015-10-14 DIAGNOSIS — D649 Anemia, unspecified: Secondary | ICD-10-CM | POA: Diagnosis not present

## 2015-10-14 DIAGNOSIS — Z89511 Acquired absence of right leg below knee: Secondary | ICD-10-CM

## 2015-10-14 DIAGNOSIS — I469 Cardiac arrest, cause unspecified: Secondary | ICD-10-CM

## 2015-10-14 DIAGNOSIS — R0902 Hypoxemia: Secondary | ICD-10-CM

## 2015-10-14 DIAGNOSIS — Z940 Kidney transplant status: Secondary | ICD-10-CM | POA: Diagnosis not present

## 2015-10-14 DIAGNOSIS — I120 Hypertensive chronic kidney disease with stage 5 chronic kidney disease or end stage renal disease: Secondary | ICD-10-CM

## 2015-10-14 DIAGNOSIS — Z95820 Peripheral vascular angioplasty status with implants and grafts: Secondary | ICD-10-CM

## 2015-10-14 DIAGNOSIS — E1122 Type 2 diabetes mellitus with diabetic chronic kidney disease: Secondary | ICD-10-CM | POA: Diagnosis not present

## 2015-10-14 DIAGNOSIS — I70261 Atherosclerosis of native arteries of extremities with gangrene, right leg: Secondary | ICD-10-CM | POA: Diagnosis not present

## 2015-10-14 DIAGNOSIS — N186 End stage renal disease: Secondary | ICD-10-CM | POA: Diagnosis not present

## 2015-10-14 DIAGNOSIS — R0602 Shortness of breath: Secondary | ICD-10-CM | POA: Diagnosis not present

## 2015-10-14 DIAGNOSIS — R0609 Other forms of dyspnea: Secondary | ICD-10-CM | POA: Diagnosis not present

## 2015-10-14 DIAGNOSIS — D72819 Decreased white blood cell count, unspecified: Secondary | ICD-10-CM | POA: Diagnosis not present

## 2015-10-14 DIAGNOSIS — I739 Peripheral vascular disease, unspecified: Secondary | ICD-10-CM

## 2015-10-14 DIAGNOSIS — Z955 Presence of coronary angioplasty implant and graft: Secondary | ICD-10-CM | POA: Diagnosis not present

## 2015-10-14 DIAGNOSIS — I462 Cardiac arrest due to underlying cardiac condition: Secondary | ICD-10-CM | POA: Diagnosis not present

## 2015-10-14 LAB — BLOOD GAS, ARTERIAL
Acid-Base Excess: 0.3 mmol/L (ref 0.0–2.0)
Bicarbonate: 25.6 mEq/L — ABNORMAL HIGH (ref 20.0–24.0)
Drawn by: 331001
FIO2: 0.21
O2 Saturation: 96.1 %
Patient temperature: 98.6
TCO2: 27.1 mmol/L (ref 0–100)
pCO2 arterial: 49.8 mmHg — ABNORMAL HIGH (ref 35.0–45.0)
pH, Arterial: 7.33 — ABNORMAL LOW (ref 7.350–7.450)
pO2, Arterial: 91.5 mmHg (ref 80.0–100.0)

## 2015-10-14 LAB — GLUCOSE, CAPILLARY
Glucose-Capillary: 165 mg/dL — ABNORMAL HIGH (ref 65–99)
Glucose-Capillary: 138 mg/dL — ABNORMAL HIGH (ref 65–99)
Glucose-Capillary: 96 mg/dL (ref 65–99)

## 2015-10-14 LAB — BASIC METABOLIC PANEL
Anion gap: 15 (ref 5–15)
BUN: 9 mg/dL (ref 6–20)
CO2: 22 mmol/L (ref 22–32)
Calcium: 8.7 mg/dL — ABNORMAL LOW (ref 8.9–10.3)
Chloride: 99 mmol/L — ABNORMAL LOW (ref 101–111)
Creatinine, Ser: 1.6 mg/dL — ABNORMAL HIGH (ref 0.44–1.00)
GFR calc Af Amer: 44 mL/min — ABNORMAL LOW (ref >60)
GFR calc non Af Amer: 38 mL/min — ABNORMAL LOW (ref >60)
Glucose, Bld: 181 mg/dL — ABNORMAL HIGH (ref 65–99)
Potassium: 4.2 mmol/L (ref 3.5–5.1)
Sodium: 136 mmol/L (ref 135–145)

## 2015-10-14 LAB — CBC
HCT: 24.6 % — ABNORMAL LOW (ref 36.0–46.0)
Hemoglobin: 7.1 g/dL — ABNORMAL LOW (ref 12.0–15.0)
MCH: 23 pg — ABNORMAL LOW (ref 26.0–34.0)
MCHC: 28.9 g/dL — ABNORMAL LOW (ref 30.0–36.0)
MCV: 79.6 fL (ref 78.0–100.0)
Platelets: 386 10*3/uL (ref 150–400)
RBC: 3.09 MIL/uL — ABNORMAL LOW (ref 3.87–5.11)
RDW: 15 % (ref 11.5–15.5)
WBC: 2.8 10*3/uL — ABNORMAL LOW (ref 4.0–10.5)

## 2015-10-14 LAB — PREPARE RBC (CROSSMATCH)

## 2015-10-14 LAB — HEMOGLOBIN A1C
Hgb A1c MFr Bld: 8.3 % — ABNORMAL HIGH (ref 4.8–5.6)
Mean Plasma Glucose: 192 mg/dL

## 2015-10-14 LAB — TROPONIN I: Troponin I: <0.03 ng/mL (ref <0.031)

## 2015-10-14 LAB — MRSA PCR SCREENING: MRSA by PCR: NEGATIVE

## 2015-10-14 MED ORDER — CEFAZOLIN SODIUM 1-5 GM-% IV SOLN
1.0000 g | Freq: Four times a day (QID) | INTRAVENOUS | Status: AC
  Administered 2015-10-14: 1 g via INTRAVENOUS
  Filled 2015-10-14 (×2): qty 50

## 2015-10-14 MED ORDER — SODIUM CHLORIDE 0.9 % IV SOLN
Freq: Once | INTRAVENOUS | Status: AC
  Administered 2015-10-14: 11:00:00 via INTRAVENOUS

## 2015-10-14 MED ORDER — OXYCODONE HCL 5 MG PO TABS
5.0000 mg | ORAL_TABLET | ORAL | Status: DC | PRN
  Administered 2015-10-14 – 2015-10-18 (×20): 5 mg via ORAL
  Filled 2015-10-14 (×20): qty 1

## 2015-10-14 MED ORDER — TECHNETIUM TC 99M DIETHYLENETRIAME-PENTAACETIC ACID: 30.2000 | Freq: Once | INTRAVENOUS | Status: DC | PRN

## 2015-10-14 MED ORDER — INSULIN ASPART 100 UNIT/ML ~~LOC~~ SOLN: 0.0000 [IU] | Freq: Every day | SUBCUTANEOUS | Status: DC

## 2015-10-14 MED ORDER — ENOXAPARIN SODIUM 60 MG/0.6ML ~~LOC~~ SOLN: 60.0000 mg | SUBCUTANEOUS | Status: DC

## 2015-10-14 MED ORDER — SODIUM CHLORIDE 0.9 % IV SOLN: Freq: Once | INTRAVENOUS | Status: DC

## 2015-10-14 MED ORDER — INSULIN ASPART 100 UNIT/ML ~~LOC~~ SOLN
0.0000 [IU] | Freq: Three times a day (TID) | SUBCUTANEOUS | Status: DC
  Administered 2015-10-14 – 2015-10-15 (×2): 1 [IU] via SUBCUTANEOUS
  Administered 2015-10-15: 2 [IU] via SUBCUTANEOUS
  Administered 2015-10-15 – 2015-10-16 (×3): 1 [IU] via SUBCUTANEOUS
  Administered 2015-10-17: 2 [IU] via SUBCUTANEOUS
  Administered 2015-10-18: 1 [IU] via SUBCUTANEOUS

## 2015-10-14 MED ORDER — ENOXAPARIN SODIUM 40 MG/0.4ML ~~LOC~~ SOLN
40.0000 mg | SUBCUTANEOUS | Status: DC
  Administered 2015-10-15 – 2015-10-18 (×4): 40 mg via SUBCUTANEOUS
  Filled 2015-10-14 (×4): qty 0.4

## 2015-10-14 MED ORDER — ENOXAPARIN SODIUM 60 MG/0.6ML ~~LOC~~ SOLN
60.0000 mg | Freq: Two times a day (BID) | SUBCUTANEOUS | Status: DC
  Administered 2015-10-14: 60 mg via SUBCUTANEOUS
  Filled 2015-10-14: qty 0.6

## 2015-10-14 MED ORDER — SODIUM CHLORIDE 0.9 % IV SOLN
Freq: Once | INTRAVENOUS | Status: AC
  Administered 2015-10-14: 22:00:00 via INTRAVENOUS

## 2015-10-14 MED ORDER — CETYLPYRIDINIUM CHLORIDE 0.05 % MT LIQD
7.0000 mL | Freq: Two times a day (BID) | OROMUCOSAL | Status: DC
  Administered 2015-10-14 – 2015-10-18 (×8): 7 mL via OROMUCOSAL

## 2015-10-14 MED ORDER — TECHNETIUM TO 99M ALBUMIN AGGREGATED
4.1000 | Freq: Once | INTRAVENOUS | Status: AC | PRN
  Administered 2015-10-14: 4 via INTRAVENOUS

## 2015-10-14 NOTE — Progress Notes (Signed)
  Patient Name: [name]   MRN: [id number]   Date of Birth/ Sex: [birth year] , female      Admission Date: 10/13/2015  Attending Provider: Newt Minion, MD  Primary Diagnosis: <principal problem not specified>   Indication: Pt was in her usual state of health until this AM, when she was noted to be unresponsive in pulseless asystole. Code blue was subsequently called. At the time of arrival on scene, ACLS protocol was underway.   Technical Description:  - CPR performance duration:  2  minutes  - Was defibrillation or cardioversion used? No   - Was external pacer placed? No  - Was patient intubated pre/post CPR? No   Medications Administered: Y = Yes; Blank = No Amiodarone    Atropine    Calcium    Epinephrine    Lidocaine    Magnesium    Norepinephrine    Phenylephrine    Sodium bicarbonate    Vasopressin     Post CPR evaluation:  - Final Status - Was patient successfully resuscitated ? Yes - What is current rhythm? Normal sinus rhythm - What is current hemodynamic status? stable  Miscellaneous Information:  - Labs sent, including: CBC BMP  - Primary team notified?  Yes  - Family Notified? No  - Additional notes/ transfer status:      Loleta Chance, MD  10/14/2015, 8:25 AM

## 2015-10-14 NOTE — Progress Notes (Signed)
Requested orders for cbgs and coverage ac/hs from Dr. Melburn Hake as pt has history of diabetes, diet controlled and serum glucose was 181 this morning.

## 2015-10-14 NOTE — H&P (Signed)
Date: 10/14/2015               Patient Name:  [name] MRN: [id number]  DOB: March 06, 1971 Age / Sex: 45 y.o., female   PCP: Benito Mccreedy, MD         Medical Service: Internal Medicine Teaching Service         Attending Physician: Dr. Aldine Contes, MD    First Contact: Dr. Loleta Chance Pager: [phone]  Second Contact: Dr. Charlott Rakes Pager: [phone]       After Hours (After 5p/  First Contact Pager: [phone]  weekends / holidays): Second Contact Pager: [phone]   Chief Complaint: "I passed out now I feel tired."  History of Present Illness:  [name] is a 45 year old lady who underwent renal transplantion in 2014 from hypertensive nephropathy, peripheral vascular disease status-post percutaneous transluminal angioplasty of the right common iliac artery with stent placement on 10/05/2015, and more recently a right transtibial amputation on 10/13/2015 for dry gangrene.  Earlier this morning, she was planned to be discharged from the orthopedic service after her amputation when she became progressively short of breath and hypoxic to the mid 80s. The nurse was adjusting her oxygen when she became suddenly unresponsive and was found to be pulseless. A code was called; she was in pulseless asystole for about 2 minutes. She underwent chest compressions, did not receive any medications, and fortunately returned to spontaneous circulation but remained hypoxic to the mid 70s on 2L oxygen, blood pressures stable in the 140s/70s. She denies any chest pain, shortness of breath, pain in her legs; review of systems was otherwise non-revealing.  Medications: Mycophenolate 540mg  daily Tacrolimus 1.5mg  twice daily Amlodipine 10mg  daily Labetalol 300mg  daily Aspirin 81mg  daily Pravastatin 20mg  daily Pantoprazole 40mg  daily  Allergies: No known allergies  Past medical history: End-stage renal disease status-post renal transplantation in 2014 Hypertension Peripheral vascular disease  status-post angioplasty of right common iliac artery in April 2017 and right transtibial amputation on October 13 2015  Family history: Malignant hyperthermia and hypertension in mother and father  Social history: She is single, does not smoke cigarettes, and does not drink alcohol Review of Systems: Per HPI  Physical Exam: Blood pressure 131/58, pulse 86, temperature 98.2 F (36.8 C), temperature source Oral, resp. rate 16, last menstrual period 09/24/2015, SpO2 100 %. General: resting in bed comfortably, lethargic but conversational HEENT: no scleral icterus, extra-ocular muscles intact, oropharynx without lesions Cardiac: regular rate and rhythm, no rubs, murmurs or gallops, external jugular vein dilated to neck Pulm: breathing well, clear to auscultation bilaterally Abd: bowel sounds normal, soft, nondistended, non-tender Ext: warm and well perfused, without pedal edema, right leg status-post transtibial amputation Lymph: no cervical or supraclavicular lymphadenopathy Skin: no rash, hair, or nail changes Neuro: alert and oriented X3, cranial nerves II-XII grossly intact, moving all extremities well  Lab results: Basic Metabolic Panel:  Recent Labs  10/13/15 1651 10/14/15 0840  NA 138 136  K 3.9 4.2  CL  --  99*  CO2  --  22  GLUCOSE 123* 181*  BUN  --  9  CREATININE  --  1.60*  CALCIUM  --  8.7*   CBC:  Recent Labs  10/13/15 1651 10/14/15 0840  WBC  --  2.8*  HGB 8.8* 7.1*  HCT 26.0* 24.6*  MCV  --  79.6  PLT  --  386   CBG:  Recent Labs  10/13/15 1905 10/13/15 2132 10/14/15 0637  GLUCAP  125* 193* 165*   Hemoglobin A1C:  Recent Labs  10/13/15 1651  HGBA1C 8.3*   Imaging results:  Dg Chest Port 1 View  10/14/2015  CLINICAL DATA:  Asystole EXAM: PORTABLE CHEST 1 VIEW COMPARISON:  12/23/2012 FINDINGS: Heart size upper normal and stable. Vascular pattern normal. Lungs clear. IMPRESSION: No active disease. Electronically Signed   By: Skipper Cliche  M.D.   On: 10/14/2015 09:09   Other results: EKG: normal sinus rhythm, low voltage with subtle electrical alternans, no right axis deviation.  Assessment & Plan by Problem:  [name] is a 45 year old lady status-post renal transplantion from hypertensive nephropathy, and severe peripheral vascular disease status-post right transtibial amputation on 10/13/2015, who was transferred to our service for further evaluation of pulseless asystole followed by new-onset hypoxia highly concerning for pulmonary embolus.  Pulseless asystole followed by new-onset hypoxia: I'm very concerned this is a pulmonary embolus that may have transiently caused asystole when passing through the heart. Fortunately she's oxygenating well on 3L, her pressures are reassuringly normal, and she's mentating well. We've started subcutaneous enoxaparin and will further evaluate with a V/Q scan; we'll avoid contrast if possible given her renal transplant. We'll also order an echocardiogram to evaluate for right heart strain. She has JVD and subtle electrical alternans, but her heart sounds are clear and her blood pressure is normal; I doubt this is a pericardial effusion so an echo will help rule it out. A coronary ischemic event is another consideration given her severe peripheral vascular disease but her EKG is reassuring and she denies any chest pain. I've transferred her to step-down so she can be closely monitored tonight.  -Started enoxaparin 60mg  twice daily -Ordered stat V/Q scan -Ordered stat TTE -Transfer to step-down  Dry gangrene status-post right transtibial amputation 10/13/2015: Given she's now anticoagulated, we'll be checking daily CBCs to ensure she's not hemorrhaging from her surgical site. She has stable anemia around 7-8, probably from her immunosuppressants for her renal transplant. She is not in pain so we'll avoid opiates. -Daily CBCs -Holding opiates  End-stage renal disease status-post renal transplant  2014: Her renal function appears to be stable. We're hoping to avoid contrast with a V/Q scan per above to prevent uneccessary harm to her transplant. -Daily BMPs -Continue mycophenolate and tacrolimus  Anemia and leukopenia: She's been anemic and leukopenic since her renal transplant in 2014; this is most likely related to her immunosuppressants. -Daily CBCs  Hypertension: Pressures well-controlled at 150s on her home regimen. -Continue labetalol 300mg  daily  Dispo: Disposition is deferred at this time, awaiting improvement of current medical problems. Anticipated discharge in approximately 2-5 day(s).   The patient does have a current PCP Benito Mccreedy, MD) and does need an [facility] hospital follow-up appointment after discharge.  The patient does have transportation limitations that hinder transportation to clinic appointments.  Signed: Loleta Chance, MD 10/14/2015, 10:38 AM

## 2015-10-14 NOTE — Progress Notes (Signed)
Pt had episode of shortness at around 0820, went into room and patient had no pulse, was unresponsive and turning blue, called code and started compressions, Dr. Posey Pronto and respiratory showed up, CPR was started and patient went back into normal sinus after around 2 minutes of CPR, no meds pushed and no shocks, patient is now back to baseline and is being transferred to [facility], mother was called and notified

## 2015-10-14 NOTE — Progress Notes (Signed)
   Subjective:  Patient coded and required brief period of chest compressions.  Hospitalist was near by and conducted the code.  Objective:   VITALS:   Filed Vitals:   10/13/15 1945 10/13/15 2100 10/14/15 0100 10/14/15 0500  BP: 172/86 164/84 107/65 131/58  Pulse: 99 100 79 86  Temp: 98.5 F (36.9 C) 98.2 F (36.8 C) 97.9 F (36.6 C) 98.2 F (36.8 C)  TempSrc:  Oral Oral Oral  Resp: 10 16 16 16   SpO2: 100% 100% 98% 100%    Slightly drowsy On [place] Dressing c/d/i with good seal and suction   Lab Results  Component Value Date   WBC 2.5* 10/11/2015   HGB 8.8* 10/13/2015   HCT 26.0* 10/13/2015   MCV 75.1* 10/11/2015   PLT 385 10/11/2015     Assessment/Plan:  1 Day Post-Op   - Expected postop acute blood loss anemia - will monitor for symptoms - transfer to step down unit for closer monitoring - VQ scan ordered to r/o PE - NWB RLE - appreciate hospitalist assistance  Marianna Payment 10/14/2015, 9:02 AM [phone]

## 2015-10-14 NOTE — Progress Notes (Signed)
Pharmacy note: lovenox  45 yo female on full dose lovenox for PE and pharmacy consulted to change to prophylaxis dosing. Last loveox dose was ~ 11am today. -SCr= 1.6, CrCl ~ 40 -Hg= 7.1 (likely post-op anemia; s/p OR on 4/28)  Plan -lovenox 40mg  [place] q24hr (start 4/30) -Will sign off. Please contact pharmacy with any other needs.  Thank you -Hildred Laser, Pharm D 10/14/2015 5:51 PM

## 2015-10-14 NOTE — Progress Notes (Signed)
Orders clarified with Dr. Melburn Hake.  Pt has PRBC on standby, not to be given yet.  Also requested prn medication for post op surgical pain.  Will continue to monitor pt closely.

## 2015-10-14 NOTE — Discharge Summary (Signed)
Name: [name] MRN: [id number] DOB: [birth year] 45 y.o. PCP: Benito Mccreedy, MD  Date of Admission: 10/13/2015  4:07 PM Date of Discharge: 10/18/2015 Attending Physician: Sid Falcon, MD  Discharge Diagnosis: 1. Episode of pulseless asystole with ROSC after 2 min of CPR (10/14/2015) 2. Dry gangrene status-post right transtibial amputation 10/13/2015 3. End-stage renal disease status-post   Active Problems:   Gangrene associated with diabetes mellitus (HCC)   Asystole (HCC)   S/P unilateral BKA (below knee amputation) (HCC)   Pericardial effusion   Type 2 diabetes mellitus with peripheral neuropathy (HCC)   ESRD on dialysis [facility])   Essential hypertension   Anemia of chronic disease   Acute blood loss anemia  Discharge Medications:   Medication List    TAKE these medications        acetaminophen 325 MG tablet  Commonly known as:  TYLENOL  Take 2 tablets (650 mg total) by mouth every 6 (six) hours as needed for mild pain (or Fever >/= 101).     albuterol 108 (90 Base) MCG/ACT inhaler  Commonly known as:  PROVENTIL HFA;VENTOLIN HFA  Inhale 1 puff into the lungs every 6 (six) hours as needed for wheezing or shortness of breath.     amLODipine 10 MG tablet  Commonly known as:  NORVASC  Take 10 mg by mouth at bedtime.     aspirin 81 MG EC tablet  Take 1 tablet (81 mg total) by mouth daily.     enoxaparin 40 MG/0.4ML injection  Commonly known as:  LOVENOX  Inject 0.4 mLs (40 mg total) into the skin daily.     ferrous sulfate 325 (65 FE) MG tablet  Take 1 tablet (325 mg total) by mouth 3 (three) times daily with meals.     insulin aspart 100 UNIT/ML injection  Commonly known as:  novoLOG  Inject 4-14 Units into the skin 3 (three) times daily before meals. Per sliding scale: 100-150, 4 units. 151-200, 6 units. 201-250, 8 units. 251-300, 10 units. 301-350, 12 units. Over 350, 14 units.     insulin aspart 100 UNIT/ML injection  Commonly known as:  novoLOG    Inject 0-9 Units into the skin 3 (three) times daily with meals.     insulin detemir 100 UNIT/ML injection  Commonly known as:  LEVEMIR  Inject 16-22 Units into the skin See admin instructions. 16 units in the morning and 22 units at bedtime     labetalol 200 MG tablet  Commonly known as:  NORMODYNE  Take 1 tablet (200 mg total) by mouth 2 (two) times daily.     mycophenolate 180 MG EC tablet  Commonly known as:  MYFORTIC  Take 360-540 mg by mouth 2 (two) times daily. 540 mg in the morning and 360 mg at night     nitroGLYCERIN 0.2 mg/hr patch  Commonly known as:  NITRODUR - Dosed in mg/24 hr  Place 1 patch (0.2 mg total) onto the skin daily.     oxyCODONE-acetaminophen 5-325 MG tablet  Commonly known as:  PERCOCET/ROXICET  Take 1-2 tablets by mouth every 4 (four) hours as needed for severe pain.     oxyCODONE-acetaminophen 5-325 MG tablet  Commonly known as:  ROXICET  Take 1 tablet by mouth every 4 (four) hours as needed for severe pain.     pantoprazole 40 MG tablet  Commonly known as:  PROTONIX  Take 40 mg by mouth daily.     pravastatin 20 MG tablet  Commonly  known as:  PRAVACHOL  Take 20 mg by mouth at bedtime.     sulfamethoxazole-trimethoprim 400-80 MG tablet  Commonly known as:  BACTRIM,SEPTRA  Take 1 tablet by mouth every Monday, Wednesday, and Friday.     tacrolimus 0.5 MG capsule  Commonly known as:  PROGRAF  Take 0.5 mg by mouth 2 (two) times daily.     tacrolimus 1 MG capsule  Commonly known as:  PROGRAF  Take 1.5 mg by mouth 2 (two) times daily.     Vitamin D (Ergocalciferol) 50000 units Caps capsule  Commonly known as:  DRISDOL  Take 50,000 Units by mouth every 7 (seven) days. Sundays        Disposition and follow-up:   Ms.[name] was discharged from [facility] in Stable condition.  At the hospital follow up visit please address:  1.  R foot amputation 4/28: Needs follow up with ortho. Wound vanc through 5/5.        Pulseless asystole: F/U with cards for ischemic work up.       HTN: sbp 160s. Increased labetalol to 200 mg bid. Continued amlodipine 10. Titrate as needed.  2.  Labs / imaging needed at time of follow-up: None  3.  Pending labs/ test needing follow-up: None  Follow-up Appointments: Follow-up Information    Follow up with DUDA,MARCUS V, MD In 1 week.   Specialty:  Orthopedic Surgery   Contact information:   [place] [address] [phone]       Follow up with Adrian Prows, MD. Schedule an appointment as soon as possible for a visit in 2 weeks.   Specialty:  Cardiology   Contact information:   [address] [place] [address] [phone]       Follow up with [name],GEORGE, MD. Schedule an appointment as soon as possible for a visit in 2 weeks.   Specialty:  Internal Medicine   Contact information:   [id number] [place] [address] [phone]       Discharge Instructions: Discharge Instructions    Apply cam walker    Complete by:  As directed   Laterality:  Right     Call MD / Call 911    Complete by:  As directed   If you experience chest pain or shortness of breath, CALL 911 and be transported to the hospital emergency room.  If you develope a fever above 101 F, pus (white drainage) or increased drainage or redness at the wound, or calf pain, call your surgeon's office.     Call MD / Call 911    Complete by:  As directed   If you experience chest pain or shortness of breath, CALL 911 and be transported to the hospital emergency room.  If you develope a fever above 101.5 F, pus (white drainage) or increased drainage or redness at the wound, or calf pain, call your surgeon's office.     Call MD / Call 911    Complete by:  As directed   If you experience chest pain or shortness of breath, CALL 911 and be transported to the hospital emergency room.  If you develope a fever above 101 F, pus (white drainage) or  increased drainage or redness at the wound, or calf pain, call your surgeon's office.     Constipation Prevention    Complete by:  As directed   Drink plenty of fluids.  Prune juice may be  helpful.  You may use a stool softener, such as Colace (over the counter) 100 mg twice a day.  Use MiraLax (over the counter) for constipation as needed.     Constipation Prevention    Complete by:  As directed   Drink plenty of fluids.  Prune juice may be helpful.  You may use a stool softener, such as Colace (over the counter) 100 mg twice a day.  Use MiraLax (over the counter) for constipation as needed.     Constipation Prevention    Complete by:  As directed   Drink plenty of fluids.  Prune juice may be helpful.  You may use a stool softener, such as Colace (over the counter) 100 mg twice a day.  Use MiraLax (over the counter) for constipation as needed.     Diet - low sodium heart healthy    Complete by:  As directed      Diet - low sodium heart healthy    Complete by:  As directed      Diet - low sodium heart healthy    Complete by:  As directed      Diet - low sodium heart healthy    Complete by:  As directed      Diet general    Complete by:  As directed      Driving restrictions    Complete by:  As directed   No driving while taking narcotic pain meds.     Increase activity slowly as tolerated    Complete by:  As directed      Increase activity slowly as tolerated    Complete by:  As directed      Increase activity slowly as tolerated    Complete by:  As directed      Increase activity slowly    Complete by:  As directed      Neg Press Wound Therapy / Incisional    Complete by:  As directed   Leave incisional wound VAC in place for 1 week. Changeover to dry dressing changes at that time.     Neg Press Wound Therapy / Incisional    Complete by:  As directed   Use the portable Prevena wound VAC pump for 1 week after discharge. Then wound VAC dressing can be removed and start dry dressing  changes. If the pump alarms then discontinue the dressing.     Non weight bearing    Complete by:  As directed   Laterality:  right  Extremity:  Lower           Consultations:    Procedures Performed:  Nm Pulmonary Perf And Vent  10/14/2015  CLINICAL DATA:  Recent lower extremity amputation, progressive postoperative shortness of breath and hypoxia this morning EXAM: NUCLEAR MEDICINE VENTILATION - PERFUSION LUNG SCAN TECHNIQUE: Ventilation images were obtained in multiple projections using inhaled aerosol Tc-70m DTPA. Perfusion images were obtained in multiple projections after intravenous injection of Tc-31m MAA. RADIOPHARMACEUTICALS:  30.2 mCi Technetium-11m DTPA aerosol inhalation and 4.1 mCi Technetium-65m MAA IV COMPARISON:  10/14/2015 chest radiograph FINDINGS: Ventilation: Ventilation defect lateral right mid lung zone matching the perfusion defect described below. Perfusion: Subtle peripheral perfusion defect laterally in the right mid lung zone which does not correspond to a particular segment. There is a matched ventilation defect with no radiographic abnormality in this area. No other defects. IMPRESSION: Low probability V/Q scan. Electronically Signed   By: Skipper Cliche M.D.   On: 10/14/2015 14:44  Dg Chest Port 1 View  10/14/2015  CLINICAL DATA:  Asystole EXAM: PORTABLE CHEST 1 VIEW COMPARISON:  12/23/2012 FINDINGS: Heart size upper normal and stable. Vascular pattern normal. Lungs clear. IMPRESSION: No active disease. Electronically Signed   By: Skipper Cliche M.D.   On: 10/14/2015 09:09    2D Echo: Study Conclusions  - Left ventricle: The cavity size was normal. Wall thickness was  increased in a pattern of moderate to severe LVH. Systolic  function was vigorous. The estimated ejection fraction was in the  range of 65% to 70%. There was dynamic obstruction at restin the  mid cavity, with a peak velocity of 150 cm/sec and a peak  gradient of 9 mm Hg. Wall motion  was normal; there were no  regional wall motion abnormalities. - Pericardium, extracardiac: A small to moderate pericardial  effusion was identified.  Admission HPI: Ms. Jezierski is a 45 year old lady who underwent renal transplantion in 2014 from hypertensive nephropathy, peripheral vascular disease status-post percutaneous transluminal angioplasty of the right common iliac artery with stent placement on 10/05/2015, and more recently a right transtibial amputation on 10/13/2015 for dry gangrene.  Earlier this morning, she was planned to be discharged from the orthopedic service after her amputation when she became progressively short of breath and hypoxic to the mid 80s. The nurse was adjusting her oxygen when she became suddenly unresponsive and was found to be pulseless. A code was called; she was in pulseless asystole for about 2 minutes. She underwent chest compressions, did not receive any medications, and fortunately returned to spontaneous circulation but remained hypoxic to the mid 70s on 2L oxygen, blood pressures stable in the 140s/70s. She denies any chest pain, shortness of breath, pain in her legs; review of systems was otherwise non-revealing.  Hospital Course by problem list:   Episode of pulseless asystole with ROSC after 2 min of CPR (10/14/2015) with new hypoxia: Unclear cause. Patient was not on telemetry at the time. Patient had no symptoms prior to episode. V/Q scan unremarkable. Troponins negative. No changes on EKG. ECHO showed small to moderate pericardial effusion. Dynamic obstruction at rest in the mid cavity. Cardiology consulted, will need follow up outpatient for ischemic work up. Hypoxia resolved, doing well on room air.   Dry gangrene status-post right transtibial amputation 10/13/2015: Patient underwent Chopart amputation of her right foot on 4/28 secondary to grangrene of the midfoot and forefoot. Postoperatively had asystole with ROSC after 1 round of CPR per above. No  complications since. Wound vac removed prior to discharge. Will need follow up with ortho. Pain well-controlled on acetaminophen and oxycodone 5mg  every 4 hours as needed. Discharge to SNF for PT and wound care.   End-stage renal disease status-post renal transplant 2014: Her renal function appears to be stable. No need to follow BMPs. Continued mycophenolate and tacrolimus  Anemia and leukopenia: She's been anemic and leukopenic since her renal transplant in 2014; this is most likely related to her immunosuppressants. Hgb stable.   Hypertension: Pressures elevated on home dose medications in 160s SBP. Changed labetalol to 200 mg bid. Continued amlodipine 10 mg daily.   Discharge Vitals:   BP 152/65 mmHg  Pulse 98  Temp(Src) 98.6 F (37 C) (Oral)  Resp 16  Ht 5' (1.524 m)  Wt 141 lb 11.2 oz (64.275 kg)  BMI 27.67 kg/m2  SpO2 92%  LMP 09/24/2015 (Approximate)  Discharge Labs:  Results for orders placed or performed during the hospital encounter of 10/13/15 (from  the past 24 hour(s))  Glucose, capillary     Status: Abnormal   Collection Time: 10/17/15 11:54 AM  Result Value Ref Range   Glucose-Capillary 158 (H) 65 - 99 mg/dL  Glucose, capillary     Status: Abnormal   Collection Time: 10/17/15  4:13 PM  Result Value Ref Range   Glucose-Capillary 105 (H) 65 - 99 mg/dL  Glucose, capillary     Status: Abnormal   Collection Time: 10/17/15  8:35 PM  Result Value Ref Range   Glucose-Capillary 116 (H) 65 - 99 mg/dL  Glucose, capillary     Status: Abnormal   Collection Time: 10/18/15  7:52 AM  Result Value Ref Range   Glucose-Capillary 135 (H) 65 - 99 mg/dL    Signed: Maryellen Pile, MD 10/18/2015, 11:27 AM

## 2015-10-14 NOTE — Progress Notes (Signed)
PT Cancellation Note  Patient Details Name: [name] MRN: [id number] DOB: 1971-06-06   Cancelled Treatment:    Reason Eval/Treat Not Completed: Medical issues which prohibited therapy (pt currently with rapid response and code. Not medically appropriate at this time.)   Melford Aase 10/14/2015, 8:27 AM Elwyn Reach, [place]

## 2015-10-14 NOTE — Progress Notes (Signed)
ANTICOAGULATION CONSULT NOTE - Initial Consult  Pharmacy Consult for lovenox Indication: pulmonary embolus  No Known Allergies  Patient Measurements:    Vital Signs: Temp: 98.2 F (36.8 C) (04/29 0500) Temp Source: Oral (04/29 0500) BP: 131/58 mmHg (04/29 0500) Pulse Rate: 86 (04/29 0500)  Labs:  Recent Labs  10/13/15 1651 10/14/15 0840  HGB 8.8* 7.1*  HCT 26.0* 24.6*  PLT  --  386    Estimated Creatinine Clearance: 52.9 mL/min (by C-G formula based on Cr of 1.11).   Medical History: Past Medical History  Diagnosis Date  . Retinopathy   . Metabolic bone disease   . Anemia   . CHF (congestive heart failure) ([place])   . History of blood transfusion     related to "kidney transplant"  . Hypertension     sees Dr. Carolin Guernsey  . High cholesterol   . ESRD (end stage renal disease) on dialysis ([place]) 09/28/2011-01/23/2013    M-W-F; [facility]  . Pneumonia 09/27/2011  . Type II diabetes mellitus ([place])     "controlled with diet"  . Critical ischemia of lower extremity hospitalized 10/05/2015    right  . Peripheral vascular disease ([place])   . Diabetic neuropathy (HCC)     Medications:  Scheduled:  . sodium chloride   Intravenous Once  . amLODipine  10 mg Oral QHS  . aspirin  81 mg Oral Daily  .  ceFAZolin (ANCEF) IV  1 g Intravenous Q6H  . enoxaparin (LOVENOX) injection  60 mg Subcutaneous Q12H  . labetalol  300 mg Oral Daily  . mycophenolate  360 mg Oral QHS  . mycophenolate  540 mg Oral Daily  . pantoprazole  40 mg Oral Daily  . pravastatin  20 mg Oral QHS  . tacrolimus  1.5 mg Oral BID    Assessment: Pharmacy to dose lovenox for r/o PE. S/P right chopart amputation 4/28. Hgb 7.1; low but stable. Plt WNL. Expected post-op blood loss anemia per ortho. No overt bleeding reported. S/P renal transplant with SCr returning to normal; currently 1.11. Noted code blue activation this morning.     Plan:  -Lovenox 60mg  SQ BID -Monitor CBC, s/sx bleeding -Follow up  results of VQ scan   Stephens November, PharmD Clinical Pharmacy Resident 9:26 AM, 10/14/2015

## 2015-10-15 DIAGNOSIS — D649 Anemia, unspecified: Secondary | ICD-10-CM | POA: Diagnosis not present

## 2015-10-15 DIAGNOSIS — D72819 Decreased white blood cell count, unspecified: Secondary | ICD-10-CM | POA: Diagnosis not present

## 2015-10-15 DIAGNOSIS — I739 Peripheral vascular disease, unspecified: Secondary | ICD-10-CM | POA: Diagnosis not present

## 2015-10-15 DIAGNOSIS — I120 Hypertensive chronic kidney disease with stage 5 chronic kidney disease or end stage renal disease: Secondary | ICD-10-CM | POA: Diagnosis not present

## 2015-10-15 DIAGNOSIS — Z955 Presence of coronary angioplasty implant and graft: Secondary | ICD-10-CM | POA: Diagnosis not present

## 2015-10-15 DIAGNOSIS — R0902 Hypoxemia: Secondary | ICD-10-CM | POA: Diagnosis not present

## 2015-10-15 DIAGNOSIS — Z95820 Peripheral vascular angioplasty status with implants and grafts: Secondary | ICD-10-CM | POA: Diagnosis not present

## 2015-10-15 DIAGNOSIS — Z940 Kidney transplant status: Secondary | ICD-10-CM | POA: Diagnosis not present

## 2015-10-15 DIAGNOSIS — Z89511 Acquired absence of right leg below knee: Secondary | ICD-10-CM | POA: Diagnosis not present

## 2015-10-15 DIAGNOSIS — I469 Cardiac arrest, cause unspecified: Secondary | ICD-10-CM | POA: Diagnosis not present

## 2015-10-15 DIAGNOSIS — I70261 Atherosclerosis of native arteries of extremities with gangrene, right leg: Secondary | ICD-10-CM | POA: Diagnosis not present

## 2015-10-15 DIAGNOSIS — E1122 Type 2 diabetes mellitus with diabetic chronic kidney disease: Secondary | ICD-10-CM | POA: Diagnosis not present

## 2015-10-15 DIAGNOSIS — N186 End stage renal disease: Secondary | ICD-10-CM | POA: Diagnosis not present

## 2015-10-15 LAB — CBC
HCT: 27.5 % — ABNORMAL LOW (ref 36.0–46.0)
Hemoglobin: 8.6 g/dL — ABNORMAL LOW (ref 12.0–15.0)
MCH: 25 pg — ABNORMAL LOW (ref 26.0–34.0)
MCHC: 31.3 g/dL (ref 30.0–36.0)
MCV: 79.9 fL (ref 78.0–100.0)
Platelets: 358 10*3/uL (ref 150–400)
RBC: 3.44 MIL/uL — ABNORMAL LOW (ref 3.87–5.11)
RDW: 14.7 % (ref 11.5–15.5)
WBC: 3.2 10*3/uL — ABNORMAL LOW (ref 4.0–10.5)

## 2015-10-15 LAB — BASIC METABOLIC PANEL
Anion gap: 12 (ref 5–15)
BUN: 11 mg/dL (ref 6–20)
CO2: 24 mmol/L (ref 22–32)
Calcium: 8.7 mg/dL — ABNORMAL LOW (ref 8.9–10.3)
Chloride: 101 mmol/L (ref 101–111)
Creatinine, Ser: 1.28 mg/dL — ABNORMAL HIGH (ref 0.44–1.00)
GFR calc Af Amer: 57 mL/min — ABNORMAL LOW (ref >60)
GFR calc non Af Amer: 49 mL/min — ABNORMAL LOW (ref >60)
Glucose, Bld: 110 mg/dL — ABNORMAL HIGH (ref 65–99)
Potassium: 4 mmol/L (ref 3.5–5.1)
Sodium: 137 mmol/L (ref 135–145)

## 2015-10-15 LAB — GLUCOSE, CAPILLARY
Glucose-Capillary: 152 mg/dL — ABNORMAL HIGH (ref 65–99)
Glucose-Capillary: 127 mg/dL — ABNORMAL HIGH (ref 65–99)
Glucose-Capillary: 135 mg/dL — ABNORMAL HIGH (ref 65–99)
Glucose-Capillary: 175 mg/dL — ABNORMAL HIGH (ref 65–99)

## 2015-10-15 LAB — ECHOCARDIOGRAM COMPLETE
Height: 60 in
Weight: 2465.62 oz

## 2015-10-15 LAB — TROPONIN I: Troponin I: <0.03 ng/mL (ref <0.031)

## 2015-10-15 NOTE — Progress Notes (Signed)
Patient seen and examined. Case d/w residents in detail. I agree with findings and plan as documented in Dr. Melburn Hake' note  Patient feels well. Hypoxia has resolved. Low risk of PE on VQ scan. Lovenox now dc'd. ECHO completed. Will f/u result. EKG and troponins have been wnl. Will transfer out of SDU today and monitor on the floor. I am unsure if she actually experienced pulseless asystole or if she had a vasovagal episode and syncopized. No further w/u for now. Will monitor on telemetry.

## 2015-10-15 NOTE — Progress Notes (Signed)
   Subjective:  Stable in SDU  Objective:   VITALS:   Filed Vitals:   10/15/15 0500 10/15/15 0600 10/15/15 0700 10/15/15 0748  BP: 154/72 165/71 152/68   Pulse: 93 95 96   Temp:    98.5 F (36.9 C)  TempSrc:    Oral  Resp: 11 12 20    Height:      Weight:      SpO2: 100% 100% 100%     VSSAF On Klein Dressing c/d/i with good seal and suction   Lab Results  Component Value Date   WBC 3.2* 10/15/2015   HGB 8.6* 10/15/2015   HCT 27.5* 10/15/2015   MCV 79.9 10/15/2015   PLT 358 10/15/2015     Assessment/Plan:  2 Days Post-Op   - responded well to transfusion - VQ scan low probability of PE - [place] lovenox for DVT ppx - may be able to transfer back to ortho unit today if ok from hospitalist stand point  Marianna Payment 10/15/2015, 8:27 AM [phone]

## 2015-10-15 NOTE — Progress Notes (Signed)
Patient ID: [name], female   DOB: 08/14/1970, 45 y.o.   MRN: [id number]   Subjective: [name] feels her normal self this morning; she denies any chest pain, palpitations, or dyspnea. We explained the plan to watch her overnight and she is agreeable.  Objective: Vital signs in last 24 hours: Filed Vitals:   10/15/15 0600 10/15/15 0700 10/15/15 0748 10/15/15 0800  BP: 165/71 152/68  152/70  Pulse: 95 96  89  Temp:   98.5 F (36.9 C)   TempSrc:   Oral   Resp: 12 20  11   Height:      Weight:      SpO2: 100% 100%  100%   General: resting in bed comfortably, appropriately conversational Cardiac: regular rate and rhythm, no rubs, murmurs or gallops Pulm: breathing well, clear to auscultation bilaterally Ext: warm and well perfused, without pedal edema Skin: no rash, hair, or nail changes Neuro: alert and oriented X3, cranial nerves II-XII grossly intact, moving all extremities well  Lab Results: Basic Metabolic Panel:  Recent Labs Lab 10/10/15 0829 10/11/15 0251  10/14/15 0840 10/15/15 0507  NA 141 139  < > 136 137  K 3.7 3.5  < > 4.2 4.0  CL 107 102  --  99* 101  CO2 22 26  --  22 24  GLUCOSE 121* 153*  < > 181* 110*  BUN 11 9  --  9 11  CREATININE 1.19* 1.11*  --  1.60* 1.28*  CALCIUM 8.5* 8.4*  --  8.7* 8.7*  PHOS 3.2 3.1  --   --   --   < > = values in this interval not displayed.   CBC:  Recent Labs Lab 10/14/15 0840 10/15/15 0507  WBC 2.8* 3.2*  HGB 7.1* 8.6*  HCT 24.6* 27.5*  MCV 79.6 79.9  PLT 386 358   Cardiac Enzymes:  Recent Labs Lab 10/14/15 1829 10/15/15 0507  TROPONINI <0.03 <0.03   CBG:  Recent Labs Lab 10/13/15 1905 10/13/15 2132 10/14/15 0637 10/14/15 1817 10/14/15 2143 10/15/15 0737  GLUCAP 125* 193* 165* 138* 96 152*   Hemoglobin A1C:  Recent Labs Lab 10/13/15 1651  HGBA1C 8.3*    Studies/Results: Nm Pulmonary Perf And Vent  10/14/2015  CLINICAL DATA:  Recent lower extremity amputation, progressive  postoperative shortness of breath and hypoxia this morning EXAM: NUCLEAR MEDICINE VENTILATION - PERFUSION LUNG SCAN TECHNIQUE: Ventilation images were obtained in multiple projections using inhaled aerosol Tc-64m DTPA. Perfusion images were obtained in multiple projections after intravenous injection of Tc-25m MAA. RADIOPHARMACEUTICALS:  30.2 mCi Technetium-26m DTPA aerosol inhalation and 4.1 mCi Technetium-5m MAA IV COMPARISON:  10/14/2015 chest radiograph FINDINGS: Ventilation: Ventilation defect lateral right mid lung zone matching the perfusion defect described below. Perfusion: Subtle peripheral perfusion defect laterally in the right mid lung zone which does not correspond to a particular segment. There is a matched ventilation defect with no radiographic abnormality in this area. No other defects. IMPRESSION: Low probability V/Q scan. Electronically Signed   By: Skipper Cliche M.D.   On: 10/14/2015 14:44   Dg Chest Port 1 View  10/14/2015  CLINICAL DATA:  Asystole EXAM: PORTABLE CHEST 1 VIEW COMPARISON:  12/23/2012 FINDINGS: Heart size upper normal and stable. Vascular pattern normal. Lungs clear. IMPRESSION: No active disease. Electronically Signed   By: Skipper Cliche M.D.   On: 10/14/2015 09:09   Medications: I have reviewed the patient's current medications. Scheduled Meds: . amLODipine  10 mg Oral QHS  . antiseptic  oral rinse  7 mL Mouth Rinse BID  . aspirin  81 mg Oral Daily  . enoxaparin (LOVENOX) injection  40 mg Subcutaneous Q24H  . insulin aspart  0-5 Units Subcutaneous QHS  . insulin aspart  0-9 Units Subcutaneous TID WC  . labetalol  300 mg Oral Daily  . mycophenolate  360 mg Oral QHS  . mycophenolate  540 mg Oral Daily  . pantoprazole  40 mg Oral Daily  . pravastatin  20 mg Oral QHS  . tacrolimus  1.5 mg Oral BID   Continuous Infusions: . sodium chloride 10 mL/hr at 10/13/15 2030   PRN Meds:.acetaminophen **OR** acetaminophen, albuterol, ondansetron **OR** ondansetron  (ZOFRAN) IV, oxyCODONE, technetium TC 68M diethylenetriame-pentaacetic acid   Assessment/Plan:  [name] is a 45 year old lady status-post renal transplantion from hypertensive nephropathy, and severe peripheral vascular disease status-post right transtibial amputation on 10/13/2015, who was transferred to our service for further evaluation of pulseless asystole followed by new-onset hypoxia. Pulmonary embolus was ruled out with normal V/Q scan.  Pulseless asystole followed by new-onset hypoxia: I thought she threw a pulmonary embolus but V/Q scan ruled it out and her oxygen requirement is now minimal. I'm starting to question whether this was truly pulseless asystole; she was not on telemetry so we have no definitive evidence. She has JVD and subtle electrical alternans, but her heart sounds are clear and her blood pressure is normal; I doubt this is a pericardial effusion but an echo will help rule it out today. A coronary ischemic event is another consideration given her severe peripheral vascular disease but her EKG was reassuring and troponins have been normal. I've transferred her out of step-down; we'll watch her overnight, follow her TTE and consider calling cardiology based on the results. -Transfer out of SDU to telemetry -Will follow TTE and call cardiology pending results for further help -Titrate oxygen to room air -Ambulate and check oxygen  Dry gangrene status-post right transtibial amputation 10/13/2015: Pain well-controlled and physical therapy following. -Continue acetaminophen and oxycodone 5mg  every 4 hours as needed for pain  End-stage renal disease status-post renal transplant 2014: Her renal function appears to be stable. No need to follow BMPs. -Continue mycophenolate and tacrolimus  Anemia and leukopenia: She's been anemic and leukopenic since her renal transplant in 2014; this is most likely related to her immunosuppressants. I don't see a need to monitor daily  CBCs.  Hypertension: Pressures well-controlled at 150s on her home regimen. -Continue labetalol 300mg  daily  Dispo: Disposition is deferred at this time, awaiting improvement of current medical problems.  Anticipated discharge in approximately 1-2 day(s).   The patient does have a current PCP Benito Mccreedy, MD) and does need an [facility] hospital follow-up appointment after discharge.  The patient does have transportation limitations that hinder transportation to clinic appointments.  .Services Needed at time of discharge: Y = Yes, Blank = No PT:   OT:   RN:   Equipment:   Other:       Loleta Chance, MD 10/15/2015, 10:31 AM

## 2015-10-15 NOTE — Evaluation (Signed)
Physical Therapy Evaluation Patient Details Name: [name] MRN: [id number] DOB: 02/26/1971 Today's Date: 10/15/2015   History of Present Illness  Pt is a 45 y/o F s/p Rt transtibial amputation.  On 4/29 pt had episode of SOB and hypoxia to mid 80s and then became unresponsive and pulseless.  She received chest compressions and regained a pulse.  Pt's PMH includes ESRD s/p renal transplant, retinopathy, anemia, CHF, diabetic neuropathy, Rt 3rd finger amputation.    Clinical Impression  Patient is s/p above surgery resulting in functional limitations due to the deficits listed below (see PT Problem List). [name] presents w/ impaired balance and mobility s/p amputation.  PTA pt at mod I level and she is eager to regain her independence.  She currently requires min assist for safe transfers.  Pt is an excellent candidate for CIR. Patient will benefit from skilled PT to increase their independence and safety with mobility to allow discharge to the venue listed below.      Follow Up Recommendations CIR    Equipment Recommendations  Rolling walker with 5" wheels    Recommendations for Other Services OT consult;Rehab consult     Precautions / Restrictions Precautions Precautions: Fall Restrictions Weight Bearing Restrictions: Yes RLE Weight Bearing: Non weight bearing      Mobility  Bed Mobility Overal bed mobility: Needs Assistance Bed Mobility: Supine to Sit     Supine to sit: Min assist;HOB elevated     General bed mobility comments: Use of bed rail w/ HOB elevated.  Assist keeping Rt LE elevated due to intense pain when in depedent position.  Once sitting EOB slowly lowered Rt LE into dependent position.  Transfers Overall transfer level: Needs assistance Equipment used: Rolling walker (2 wheeled) Transfers: Sit to/from [facility] Sit to Stand: Min assist Stand pivot transfers: Min assist       General transfer comment: Cues for hand placement and  min assist to steady.  Ambulation/Gait                Stairs            Wheelchair Mobility    Modified Rankin (Stroke Patients Only)       Balance Overall balance assessment: Needs assistance Sitting-balance support: Bilateral upper extremity supported;Feet supported (Lt LE supported) Sitting balance-Leahy Scale: Poor Sitting balance - Comments: Bil UE supported on bed for support   Standing balance support: Bilateral upper extremity supported;During functional activity Standing balance-Leahy Scale: Poor Standing balance comment: RW and min assist for support                             Pertinent Vitals/Pain Pain Assessment: Faces Pain Score: 5  Faces Pain Scale: Hurts even more Pain Location: Rt LE Pain Descriptors / Indicators: Moaning;Throbbing Pain Intervention(s): Limited activity within patient's tolerance;Monitored during session;Repositioned;Relaxation;Patient requesting pain meds-RN notified    Home Living Family/patient expects to be discharged to:: Private residence Living Arrangements: Parent (parents who are "up in age") Available Help at Discharge: Family;Available PRN/intermittently (parents unable to provide physical assist) Type of Home: House Home Access: Stairs to enter Entrance Stairs-Rails: Left Entrance Stairs-Number of Steps: 4 Home Layout: Two level;Able to live on main level with bedroom/bathroom Home Equipment: Walker - 4 wheels;Wheelchair - manual;Bedside commode (thinks WC has leg rests; WC and BSC in storage)      Prior Function Level of Independence: Independent with assistive device(s)  Comments: Used RW at all times for pain.  Would go up stairs sideways while holding onto railing, leading w/ Rt LE.     Hand Dominance   Dominant Hand: Left    Extremity/Trunk Assessment   Upper Extremity Assessment: Overall WFL for tasks assessed           Lower Extremity Assessment: RLE  deficits/detail RLE Deficits / Details: limited as expected s/p amputation    Cervical / Trunk Assessment: Normal  Communication   Communication: No difficulties  Cognition Arousal/Alertness: Awake/alert Behavior During Therapy: WFL for tasks assessed/performed Overall Cognitive Status: Within Functional Limits for tasks assessed                      General Comments General comments (skin integrity, edema, etc.): HR 90s, SpO2 89% on RA, RN donned 1L O2 via [place] during session.  Reviewed, w/ pt, the importance of maintaining Rt knee extension and Rt LE elevation.    Exercises General Exercises - Lower Extremity Long Arc Quad: AROM;10 reps;Seated;Both Straight Leg Raises: AROM;Right;10 reps;Supine      Assessment/Plan    PT Assessment Patient needs continued PT services  PT Diagnosis Difficulty walking;Acute pain   PT Problem List Decreased activity tolerance;Decreased balance;Decreased mobility;Decreased knowledge of use of DME;Decreased safety awareness;Cardiopulmonary status limiting activity;Pain  PT Treatment Interventions DME instruction;Gait training;Stair training;Functional mobility training;Therapeutic activities;Therapeutic exercise;Balance training;Patient/family education;Modalities   PT Goals (Current goals can be found in the Care Plan section) Acute Rehab PT Goals Patient Stated Goal: decreased pain; rehab before home PT Goal Formulation: With patient Time For Goal Achievement: 10/29/15 Potential to Achieve Goals: Good    Frequency Min 4X/week   Barriers to discharge Inaccessible home environment steps to enter home    Co-evaluation               End of Session Equipment Utilized During Treatment: Gait belt;Oxygen Activity Tolerance: Patient limited by pain Patient left: in chair;with call bell/phone within reach;with chair alarm set Nurse Communication: Mobility status    Functional Assessment Tool Used: Clinical Judgement Functional  Limitation: Mobility: Walking and moving around Mobility: Walking and Moving Around Current Status [id number]): At least 20 percent but less than 40 percent impaired, limited or restricted Mobility: Walking and Moving Around Goal Status [phone]): At least 1 percent but less than 20 percent impaired, limited or restricted    Time: 1420-1448 PT Time Calculation (min) (ACUTE ONLY): 28 min   Charges:   PT Evaluation $PT Eval Moderate Complexity: 1 Procedure PT Treatments $Therapeutic Activity: 8-22 mins   PT G Codes:   PT G-Codes **NOT FOR INPATIENT CLASS** Functional Assessment Tool Used: Clinical Judgement Functional Limitation: Mobility: Walking and moving around Mobility: Walking and Moving Around Current Status [id number]): At least 20 percent but less than 40 percent impaired, limited or restricted Mobility: Walking and Moving Around Goal Status [phone]): At least 1 percent but less than 20 percent impaired, limited or restricted   Collie Siad PT, DPT  Pager: [phone] Phone: [phone] 10/15/2015, 3:07 PM

## 2015-10-15 NOTE — Progress Notes (Signed)
Rehab Admissions Coordinator Note:  Patient was screened by Retta Diones for appropriateness for an Inpatient Acute Rehab Consult.  Noted PT recommending inpatient rehab.  At this time, we are recommending Inpatient Rehab consult.  We would also need an OT evaluation ordered since patient has [employer] and we would need their approval.  Retta Diones 10/15/2015, 6:35 PM  I can be reached at [phone].

## 2015-10-16 ENCOUNTER — Encounter (HOSPITAL_COMMUNITY): Payer: Self-pay | Admitting: Orthopedic Surgery

## 2015-10-16 DIAGNOSIS — E1165 Type 2 diabetes mellitus with hyperglycemia: Secondary | ICD-10-CM

## 2015-10-16 DIAGNOSIS — E1142 Type 2 diabetes mellitus with diabetic polyneuropathy: Secondary | ICD-10-CM | POA: Diagnosis not present

## 2015-10-16 DIAGNOSIS — Z95820 Peripheral vascular angioplasty status with implants and grafts: Secondary | ICD-10-CM | POA: Diagnosis not present

## 2015-10-16 DIAGNOSIS — E1151 Type 2 diabetes mellitus with diabetic peripheral angiopathy without gangrene: Secondary | ICD-10-CM | POA: Diagnosis not present

## 2015-10-16 DIAGNOSIS — D631 Anemia in chronic kidney disease: Secondary | ICD-10-CM | POA: Insufficient documentation

## 2015-10-16 DIAGNOSIS — Z992 Dependence on renal dialysis: Secondary | ICD-10-CM | POA: Diagnosis not present

## 2015-10-16 DIAGNOSIS — D638 Anemia in other chronic diseases classified elsewhere: Secondary | ICD-10-CM

## 2015-10-16 DIAGNOSIS — D62 Acute posthemorrhagic anemia: Secondary | ICD-10-CM | POA: Insufficient documentation

## 2015-10-16 DIAGNOSIS — I469 Cardiac arrest, cause unspecified: Secondary | ICD-10-CM | POA: Insufficient documentation

## 2015-10-16 DIAGNOSIS — N189 Chronic kidney disease, unspecified: Secondary | ICD-10-CM

## 2015-10-16 DIAGNOSIS — Z940 Kidney transplant status: Secondary | ICD-10-CM | POA: Diagnosis not present

## 2015-10-16 DIAGNOSIS — R0902 Hypoxemia: Secondary | ICD-10-CM | POA: Diagnosis not present

## 2015-10-16 DIAGNOSIS — I120 Hypertensive chronic kidney disease with stage 5 chronic kidney disease or end stage renal disease: Secondary | ICD-10-CM | POA: Diagnosis not present

## 2015-10-16 DIAGNOSIS — I739 Peripheral vascular disease, unspecified: Secondary | ICD-10-CM | POA: Diagnosis not present

## 2015-10-16 DIAGNOSIS — N186 End stage renal disease: Secondary | ICD-10-CM | POA: Diagnosis not present

## 2015-10-16 DIAGNOSIS — N184 Chronic kidney disease, stage 4 (severe): Secondary | ICD-10-CM | POA: Insufficient documentation

## 2015-10-16 DIAGNOSIS — I3139 Other pericardial effusion (noninflammatory): Secondary | ICD-10-CM | POA: Insufficient documentation

## 2015-10-16 DIAGNOSIS — I10 Essential (primary) hypertension: Secondary | ICD-10-CM | POA: Diagnosis not present

## 2015-10-16 DIAGNOSIS — Z89511 Acquired absence of right leg below knee: Secondary | ICD-10-CM | POA: Diagnosis not present

## 2015-10-16 DIAGNOSIS — I70261 Atherosclerosis of native arteries of extremities with gangrene, right leg: Secondary | ICD-10-CM | POA: Diagnosis not present

## 2015-10-16 DIAGNOSIS — D649 Anemia, unspecified: Secondary | ICD-10-CM | POA: Diagnosis not present

## 2015-10-16 DIAGNOSIS — I319 Disease of pericardium, unspecified: Secondary | ICD-10-CM

## 2015-10-16 DIAGNOSIS — Z89519 Acquired absence of unspecified leg below knee: Secondary | ICD-10-CM | POA: Insufficient documentation

## 2015-10-16 DIAGNOSIS — I313 Pericardial effusion (noninflammatory): Secondary | ICD-10-CM | POA: Insufficient documentation

## 2015-10-16 DIAGNOSIS — D72819 Decreased white blood cell count, unspecified: Secondary | ICD-10-CM | POA: Diagnosis not present

## 2015-10-16 LAB — GLUCOSE, CAPILLARY
Glucose-Capillary: 136 mg/dL — ABNORMAL HIGH (ref 65–99)
Glucose-Capillary: 93 mg/dL (ref 65–99)
Glucose-Capillary: 142 mg/dL — ABNORMAL HIGH (ref 65–99)

## 2015-10-16 NOTE — Evaluation (Signed)
Occupational Therapy Evaluation Patient Details Name: [name] MRN: [id number] DOB: 02/01/1971 Today's Date: 10/16/2015    History of Present Illness Pt is a 45 y/o F s/p Rt transtibial amputation.  On 4/29 pt had episode of SOB and hypoxia to mid 80s and then became unresponsive and pulseless.  She received chest compressions and regained a pulse.  Pt's PMH includes ESRD s/p renal transplant, retinopathy, anemia, CHF, diabetic neuropathy, Rt 3rd finger amputation.   Clinical Impression   Patient is s/p R transtibial amputation surgery resulting in functional limitations due to the deficits listed below (see OT problem list). PTA was independent and working. Patient will benefit from skilled OT acutely to increase independence and safety with ADLS to allow discharge CIR.     Follow Up Recommendations  CIR    Equipment Recommendations  Other (comment) (TBA if denied to CIR)    Recommendations for Other Services Rehab consult (CIR pt will go home if denied per patient)     Precautions / Restrictions Precautions Precautions: Fall Precaution Comments: wound vac Restrictions Weight Bearing Restrictions: Yes RLE Weight Bearing: Non weight bearing      Mobility Bed Mobility Overal bed mobility: Needs Assistance Bed Mobility: Supine to Sit     Supine to sit: HOB elevated;Min guard     General bed mobility comments: in chair on arrival  Transfers Overall transfer level: Needs assistance Equipment used: Rolling walker (2 wheeled) Transfers: Sit to/from [facility] Sit to Stand: Min assist Stand pivot transfers: Min assist       General transfer comment: pt needed cues for hand placement. pt attempting to pull up on RW. Pt with L UE on RW and R UE on chair arm rest    Balance Overall balance assessment: Needs assistance Sitting-balance support: No upper extremity supported;Feet supported Sitting balance-Leahy Scale: Fair     Standing balance  support: Bilateral upper extremity supported;During functional activity Standing balance-Leahy Scale: Poor                              ADL Overall ADL's : Needs assistance/impaired Eating/Feeding: Independent   Grooming: Minimal assistance;Oral care;Standing Grooming Details (indicate cue type and reason): balance deficits noted     Lower Body Bathing: Minimal assistance;Sit to/from stand Lower Body Bathing Details (indicate cue type and reason): will require (A) to thread wound vac through clothing         Toilet Transfer: Minimal assistance;Stand-pivot Toilet Transfer Details (indicate cue type and reason): pt with x1 LOB to the R and (A) to regain balance           General ADL Comments: Pt reports at home using rollator to get to stairs then transferring on one leg down the stairs then father pushing her on rollator to the chair.  Pt tearful for a moment in session and states "i was normal and doing everythign before this. i was working and driving. 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      Pertinent Vitals/Pain Pain Assessment: Faces Faces Pain Scale: Hurts little more Pain Location: R LE Pain Descriptors / Indicators: Operative site guarding Pain Intervention(s): Repositioned;Premedicated before session;Monitored during session     Hand Dominance Left   Extremity/Trunk Assessment Upper Extremity Assessment Upper Extremity Assessment: RUE deficits/detail RUE Deficits / Details: 3rd digit amputation   Lower Extremity Assessment Lower Extremity  Assessment: Defer to PT evaluation   Cervical / Trunk Assessment Cervical / Trunk Assessment: Normal   Communication Communication Communication: No difficulties   Cognition Arousal/Alertness: Awake/alert Behavior During Therapy: WFL for tasks assessed/performed Overall Cognitive Status: Within Functional Limits for tasks assessed                     General Comments        Exercises       Shoulder Instructions      Home Living Family/patient expects to be discharged to:: Private residence Living Arrangements: Parent Available Help at Discharge: Family;Available PRN/intermittently Type of Home: House Home Access: Stairs to enter [employer] of Steps: 4 Entrance Stairs-Rails: Left Home Layout: Two level;Able to live on main level with bedroom/bathroom     Bathroom Shower/Tub: Teacher, early years/pre: Standard     Home Equipment: Environmental consultant - 4 wheels;Wheelchair - manual;Bedside commode   Additional Comments: Pt plans to dc home to parents home but will have to be MOD I level. Pt can also stay with a cousin that could give more (A) if required. Pt was working and driving prior to admission. pt really wants to return to church      Prior Functioning/Environment Level of Independence: Independent with assistive device(s)        Comments: Used RW at all times for pain.  Would go up stairs sideways while holding onto railing, leading w/ Rt LE.    OT Diagnosis: Generalized weakness;Acute pain   OT Problem List: Decreased strength;Decreased activity tolerance;Impaired balance (sitting and/or standing);Decreased knowledge of use of DME or AE;Decreased knowledge of precautions;Pain   OT Treatment/Interventions: Self-care/ADL training;Therapeutic exercise;DME and/or AE instruction;Therapeutic activities;Balance training;Patient/family education    OT Goals(Current goals can be found in the care plan section) Acute Rehab OT Goals Patient Stated Goal: to go to church OT Goal Formulation: With patient Time For Goal Achievement: 10/30/15 Potential to Achieve Goals: Good  OT Frequency: Min 2X/week   Barriers to D/C:            Co-evaluation              End of Session Equipment Utilized During Treatment: Gait belt;Rolling walker Nurse Communication: Mobility status;Precautions  Activity Tolerance: Patient tolerated  treatment well Patient left: in chair;with call bell/phone within reach;with chair alarm set   Time: [id number] OT Time Calculation (min): 21 min Charges:  OT General Charges $OT Visit: 1 Procedure OT Evaluation $OT Eval Moderate Complexity: 1 Procedure G-Codes:    Peri Maris 10/22/2015, 3:12 PM   Jeri Modena   OTR/L Pager: [phone] Office: [phone] .

## 2015-10-16 NOTE — Progress Notes (Signed)
Informed Internal medicine resident regarding pt BP 169/72. No new orders at this time

## 2015-10-16 NOTE — Progress Notes (Signed)
Physical Therapy Treatment Patient Details Name: [name] MRN: [id number] DOB: 05-Aug-1970 Today's Date: 10/16/2015    History of Present Illness Pt is a 45 y/o F s/p Rt transtibial amputation.  On 4/29 pt had episode of SOB and hypoxia to mid 80s and then became unresponsive and pulseless.  She received chest compressions and regained a pulse.  Pt's PMH includes ESRD s/p renal transplant, retinopathy, anemia, CHF, diabetic neuropathy, Rt 3rd finger amputation.    PT Comments    Patient is progressing gradually toward PT goals but continues to be limited by pain. Current plan remains appropriate.   Follow Up Recommendations  CIR     Equipment Recommendations  Rolling walker with 5" wheels    Recommendations for Other Services OT consult;Rehab consult     Precautions / Restrictions Precautions Precautions: Fall Restrictions Weight Bearing Restrictions: Yes RLE Weight Bearing: Non weight bearing    Mobility  Bed Mobility Overal bed mobility: Needs Assistance Bed Mobility: Supine to Sit     Supine to sit: HOB elevated;Min guard     General bed mobility comments: min guard for safety; increased time and cues for technique  Transfers Overall transfer level: Needs assistance Equipment used: Rolling walker (2 wheeled) Transfers: Sit to/from Stand Sit to Stand: Min assist;Mod assist         General transfer comment: cues for hand placment; min A to gain and maintain balance upon standing; pt with posterior lean and mod A to recover from LOB before finding COG  Ambulation/Gait Ambulation/Gait assistance: Min assist;+2 physical assistance;+2 safety/equipment Ambulation Distance (Feet): 10 Feet Assistive device: Rolling walker (2 wheeled) Gait Pattern/deviations: Step-to pattern;Trunk flexed     General Gait Details: cues for safe use of DME, posture, and sequencing   Stairs            Wheelchair Mobility    Modified Rankin (Stroke Patients Only)        Balance Overall balance assessment: Needs assistance Sitting-balance support: Bilateral upper extremity supported;Feet supported Sitting balance-Leahy Scale: Fair     Standing balance support: Bilateral upper extremity supported Standing balance-Leahy Scale: Poor                      Cognition Arousal/Alertness: Awake/alert Behavior During Therapy: WFL for tasks assessed/performed Overall Cognitive Status: Within Functional Limits for tasks assessed                      Exercises      General Comments        Pertinent Vitals/Pain Pain Assessment: Faces Faces Pain Scale: Hurts even more Pain Location: R LE Pain Descriptors / Indicators: Grimacing;Throbbing Pain Intervention(s): Limited activity within patient's tolerance;Monitored during session;Premedicated before session;Repositioned    Home Living                      Prior Function            PT Goals (current goals can now be found in the care plan section) Acute Rehab PT Goals Patient Stated Goal: decreased pain; rehab before home PT Goal Formulation: With patient Time For Goal Achievement: 10/29/15 Potential to Achieve Goals: Good Progress towards PT goals: Progressing toward goals    Frequency  Min 4X/week    PT Plan Current plan remains appropriate    Co-evaluation             End of Session Equipment Utilized During Treatment: Gait belt;Oxygen Activity Tolerance:  Patient limited by pain Patient left: in chair;with call bell/phone within reach;with chair alarm set     Time: 1321-1335 PT Time Calculation (min) (ACUTE ONLY): 14 min  Charges:  $Gait Training: 8-22 mins                    G Codes:     Salina April, PTA Pager: [phone]   10/16/2015, 2:26 PM

## 2015-10-16 NOTE — Progress Notes (Signed)
  Date: 10/16/2015  Patient name: [name]  Medical record number: [id number]  Date of birth: 20-Aug-1970   This patient has been seen and the plan of care was discussed with the house staff. Please see Dr. Shanna Cisco note for complete details. I concur with his findings.  May want to consider better control of BP given kidney transplant status.  Would recheck renal function in the AM.   Sid Falcon, MD 10/16/2015, 3:58 PM

## 2015-10-16 NOTE — Consult Note (Signed)
Physical Medicine and Rehabilitation Consult  Reason for Consult: Right foot transtibial amputation and multiple medical issues Referring Physician: Dr. Daryll Drown.    HPI: [name] is a 45 y.o. 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  She was admitted on 4/ 28 for right foot chopart amputation. Post op on 4/29 she developed unresponsiveness due to pulseless asystole requiring CPR X 2 minutes. She was started on lovenox due to concerns of  PE but V/Q scan showed low probability and question cardiac event as cause of PEA.   2D echo with EF 65-70%, dynamic obstruction at rest in mid cavity and small to moderate pericardial effusion.  Patient to be NWB RLE and PT evaluation done this weekend. CIR recommended for follow up therapy.    Review of Systems  HENT: Negative for hearing loss.   Eyes: Negative for blurred vision and double vision.  Respiratory: Negative for cough, shortness of breath and stridor.   Cardiovascular: Negative for chest pain, palpitations and leg swelling.  Gastrointestinal: Positive for heartburn. Negative for abdominal pain and constipation.  Genitourinary: Negative for dysuria and urgency.  Musculoskeletal: Negative for myalgias and back pain.  Neurological: Positive for tingling and sensory change. Negative for headaches.  Psychiatric/Behavioral: The patient is not nervous/anxious and does not have insomnia.   All other systems reviewed and are negative.     Past Medical History  Diagnosis Date  . Retinopathy   . Metabolic bone disease   . Anemia   . CHF (congestive heart failure) ([place])   . History of blood transfusion     related to "kidney transplant"  . Hypertension     sees Dr. Carolin Guernsey  . High cholesterol   . ESRD (end stage renal disease) on dialysis ([place])  09/28/2011-01/23/2013    M-W-F; [place]  . Pneumonia 09/27/2011  . Type II diabetes mellitus ([place])     "controlled with diet"  . Critical ischemia of lower extremity hospitalized 10/05/2015    right  . Peripheral vascular disease ([place])   . Diabetic neuropathy [facility])     Past Surgical History  Procedure Laterality Date  . Insertion of dialysis catheter  10/04/2011    Procedure: INSERTION OF DIALYSIS CATHETER;  Surgeon: Angelia Mould, MD;  Location: [place];  Service: Vascular;  Laterality: Right;  insertion of dialysis catheter right internal jugular  . Av fistula placement  10/04/2011    Procedure: INSERTION OF ARTERIOVENOUS (AV) GORE-TEX GRAFT ARM;  Surgeon: Angelia Mould, MD;  Location: [place];  Service: Vascular;  Laterality: Left;  Insertion left upper arm Arteriovenous goretex graft  . Avgg removal  10/04/2011    Procedure: REMOVAL OF ARTERIOVENOUS GORETEX GRAFT ([place]);  Surgeon: Elam Dutch, MD;  Location: [place];  Service: Vascular;  Laterality: Left;  . Av fistula placement  10/29/2011    Procedure: ARTERIOVENOUS (AV) FISTULA CREATION;  Surgeon: Angelia Mould, MD;  Location: [facility] OR;  Service: Vascular;  Laterality: Right;  Creation Right Arteriovenous Fistula   . Revison of arteriovenous fistula  06/23/2012    Procedure: REVISON OF ARTERIOVENOUS FISTULA;  Surgeon: Angelia Mould, MD;  Location: [place];  Service: Vascular;  Laterality: Right;  Ultrasound guided  . Insertion of dialysis catheter  06/23/2012    Procedure: INSERTION OF DIALYSIS CATHETER;  Surgeon: Harrell Gave  Nicole Cella, MD;  Location: [place];  Service: Vascular;  Laterality: N/A;  Ultrasound guided  . Patch angioplasty  06/23/2012    Procedure: PATCH ANGIOPLASTY;  Surgeon: Angelia Mould, MD;  Location: [facility] OR;  Service: Vascular;  Laterality: Right;  . Esophagogastroduodenoscopy N/A 12/24/2012    Procedure: ESOPHAGOGASTRODUODENOSCOPY (EGD);  Surgeon: Milus Banister, MD;  Location: [place];  Service: Endoscopy;  Laterality: N/A;  . Kidney transplant  01/24/2013  . Finger amputation Right 02/26/2013    "3rd finger"  . Shuntogram N/A 04/06/2012    Procedure: Earney Mallet;  Surgeon: Angelia Mould, MD;  Location: [facility] CATH LAB;  Service: Cardiovascular;  Laterality: N/A;  . Peripheral vascular catheterization N/A 09/19/2015    Procedure: Lower Extremity Angiography;  Surgeon: Adrian Prows, MD;  Location: [place] CV LAB;  Service: Cardiovascular;  Laterality: N/A;  . Peripheral vascular catheterization N/A 09/19/2015    Procedure: Abdominal Aortogram;  Surgeon: Adrian Prows, MD;  Location: [place] CV LAB;  Service: Cardiovascular;  Laterality: N/A;  . Peripheral vascular catheterization Right 09/19/2015    Procedure: Peripheral Vascular Intervention;  Surgeon: Adrian Prows, MD;  Location: [place] CV LAB;  Service: Cardiovascular;  Laterality: Right;  Right Common  Iliac  . Peripheral vascular catheterization N/A 10/06/2015    Procedure: Lower Extremity Angiography;  Surgeon: Adrian Prows, MD;  Location: [place] CV LAB;  Service: Cardiovascular;  Laterality: N/A;  . Peripheral vascular catheterization  10/06/2015    Procedure: Peripheral Vascular Intervention;  Surgeon: Adrian Prows, MD;  Location: [place] CV LAB;  Service: Cardiovascular;;  REIA  Omnilink 6.0x29, innova 7x80  RSFA innova 5x80    Family History  Problem Relation Age of Onset  . Malignant hyperthermia Mother   . Malignant hyperthermia Father   . Anesthesia problems Neg Hx   . Thyroid disease Mother   . Diabetes Brother   . Heart disease Brother     CHF  . Hypertension Mother   . Hypertension Father     Social History:  Lives with elderly  parents. Independent without AD and was working till a month ago. She reports that she has never smoked. She has never used smokeless tobacco. She reports that she does not drink alcohol or use illicit drugs.    Allergies: No Known Allergies    Medications Prior  to Admission  Medication Sig Dispense Refill  . amLODipine (NORVASC) 10 MG tablet Take 10 mg by mouth at bedtime.     Marland Kitchen aspirin 81 MG EC tablet Take 1 tablet (81 mg total) by mouth daily.    . ferrous sulfate 325 (65 FE) MG tablet Take 1 tablet (325 mg total) by mouth 3 (three) times daily with meals. 90 tablet 3  . insulin aspart (NOVOLOG) 100 UNIT/ML injection Inject 4-14 Units into the skin 3 (three) times daily before meals. Per sliding scale: 100-150, 4 units. 151-200, 6 units. 201-250, 8 units. 251-300, 10 units. 301-350, 12 units. Over 350, 14 units.    . insulin detemir (LEVEMIR) 100 UNIT/ML injection Inject 16-22 Units into the skin See admin instructions. 16 units in the morning and 22 units at bedtime    . labetalol (NORMODYNE) 300 MG tablet Take 300 mg by mouth daily.     . mycophenolate (MYFORTIC) 180 MG EC tablet Take 360-540 mg by mouth 2 (two) times daily. 540 mg in the morning and 360 mg at night    . nitroGLYCERIN (NITRODUR - DOSED IN MG/24 HR) 0.2 mg/hr  patch Place 1 patch (0.2 mg total) onto the skin daily. 30 patch 12  . oxyCODONE-acetaminophen (PERCOCET/ROXICET) 5-325 MG tablet Take 1-2 tablets by mouth every 4 (four) hours as needed for severe pain.    . pantoprazole (PROTONIX) 40 MG tablet Take 40 mg by mouth daily.    . pravastatin (PRAVACHOL) 20 MG tablet Take 20 mg by mouth at bedtime.     . sulfamethoxazole-trimethoprim (BACTRIM,SEPTRA) 400-80 MG per tablet Take 1 tablet by mouth every Monday, Wednesday, and Friday.     . tacrolimus (PROGRAF) 0.5 MG capsule Take 0.5 mg by mouth 2 (two) times daily.    . tacrolimus (PROGRAF) 1 MG capsule Take 1.5 mg by mouth 2 (two) times daily.     . Vitamin D, Ergocalciferol, (DRISDOL) 50000 units CAPS capsule Take 50,000 Units by mouth every 7 (seven) days. Sundays    . albuterol (PROVENTIL HFA;VENTOLIN HFA) 108 (90 BASE) MCG/ACT inhaler Inhale 1 puff into the lungs every 6 (six) hours as needed for wheezing or shortness of breath.       Home: Home Living Family/patient expects to be discharged to:: Private residence Living Arrangements: Parent (parents who are "up in age") Available Help at Discharge: Family, Available PRN/intermittently (parents unable to provide physical assist) Type of Home: House Home Access: Stairs to enter Technical brewer of Steps: 4 Entrance Stairs-Rails: Left Home Layout: Two level, Able to live on main level with bedroom/bathroom Bathroom Shower/Tub: Chiropodist: Standard Home Equipment: Environmental consultant - 4 wheels, Wheelchair - manual, Bedside commode (thinks WC has leg rests; WC and BSC in storage)  Functional History: Prior Function Level of Independence: Independent with assistive device(s) Comments: Used RW at all times for pain.  Would go up stairs sideways while holding onto railing, leading w/ Rt LE. Functional Status:  Mobility: Bed Mobility Overal bed mobility: Needs Assistance Bed Mobility: Supine to Sit Supine to sit: Min assist, HOB elevated General bed mobility comments: Use of bed rail w/ HOB elevated.  Assist keeping Rt LE elevated due to intense pain when in depedent position.  Once sitting EOB slowly lowered Rt LE into dependent position. Transfers Overall transfer level: Needs assistance Equipment used: Rolling walker (2 wheeled) Transfers: Sit to/from Stand, [employer] Transfers Sit to Stand: Min assist Stand pivot transfers: Min assist General transfer comment: Cues for hand placement and min assist to steady.      ADL:    Cognition: Cognition Overall Cognitive Status: Within Functional Limits for tasks assessed Orientation Level: Oriented X4 Cognition Arousal/Alertness: Awake/alert Behavior During Therapy: WFL for tasks assessed/performed Overall Cognitive Status: Within Functional Limits for tasks assessed   Blood pressure 169/72, pulse 103, temperature 99.1 F (37.3 C), temperature source Oral, resp. rate 16, height 5' (1.524  m), weight 64.275 kg (141 lb 11.2 oz), last menstrual period 09/24/2015, SpO2 100 %. Physical Exam  Nursing note and vitals reviewed. Constitutional: She is oriented to person, place, and time. She appears well-developed and well-nourished.  HENT:  Head: Normocephalic and atraumatic.  Mouth/Throat: Oropharynx is clear and moist.  Eyes: Conjunctivae and EOM are normal. Pupils are equal, round, and reactive to light.  Neck: Normal range of motion. Neck supple.  Cardiovascular: Normal rate and regular rhythm.   Murmur heard. Respiratory: Effort normal and breath sounds normal. No stridor. No respiratory distress. She has no wheezes.  GI: Soft. Bowel sounds are normal. She exhibits no distension. There is no tenderness.  Musculoskeletal: She exhibits tenderness. She exhibits no edema.  VAC  on right foot.  Amputation distal to right ankle  Neurological: She is alert and oriented to person, place, and time. No cranial nerve deficit.  Speech clear.  Follows commands without difficulty.   LLE with decreased sensation from ankle down.  Motor 5/5 throughout, except for right ankle (amputation)  Skin: Skin is warm and dry.  VAC on right lower extremity  Psychiatric: She has a normal mood and affect. Her behavior is normal.    Results for orders placed or performed during the hospital encounter of 10/13/15 (from the past 24 hour(s))  Glucose, capillary     Status: Abnormal   Collection Time: 10/15/15 11:56 AM  Result Value Ref Range   Glucose-Capillary 127 (H) 65 - 99 mg/dL  Glucose, capillary     Status: Abnormal   Collection Time: 10/15/15  6:14 PM  Result Value Ref Range   Glucose-Capillary 135 (H) 65 - 99 mg/dL  Glucose, capillary     Status: Abnormal   Collection Time: 10/15/15  9:06 PM  Result Value Ref Range   Glucose-Capillary 175 (H) 65 - 99 mg/dL   Comment 1 Notify RN    Comment 2 Document in Chart    Nm Pulmonary Perf And Vent  10/14/2015  CLINICAL DATA:  Recent lower  extremity amputation, progressive postoperative shortness of breath and hypoxia this morning EXAM: NUCLEAR MEDICINE VENTILATION - PERFUSION LUNG SCAN TECHNIQUE: Ventilation images were obtained in multiple projections using inhaled aerosol Tc-72m DTPA. Perfusion images were obtained in multiple projections after intravenous injection of Tc-66m MAA. RADIOPHARMACEUTICALS:  30.2 mCi Technetium-26m DTPA aerosol inhalation and 4.1 mCi Technetium-62m MAA IV COMPARISON:  10/14/2015 chest radiograph FINDINGS: Ventilation: Ventilation defect lateral right mid lung zone matching the perfusion defect described below. Perfusion: Subtle peripheral perfusion defect laterally in the right mid lung zone which does not correspond to a particular segment. There is a matched ventilation defect with no radiographic abnormality in this area. No other defects. IMPRESSION: Low probability V/Q scan. Electronically Signed   By: Skipper Cliche M.D.   On: 10/14/2015 14:44   Dg Chest Port 1 View  10/14/2015  CLINICAL DATA:  Asystole EXAM: PORTABLE CHEST 1 VIEW COMPARISON:  12/23/2012 FINDINGS: Heart size upper normal and stable. Vascular pattern normal. Lungs clear. IMPRESSION: No active disease. Electronically Signed   By: Skipper Cliche M.D.   On: 10/14/2015 09:09    Assessment/Plan: Diagnosis: Right foot transtibial amputation and multiple medical issues Labs and images independently reviewed.  Records reviewed and summated above. PT/OT for mobility, ADL's, strengthening,and wheelchair training Clean amputation daily with soap and water after removal of VAC Monitor incision site for signs of infection or impending skin breakdown. Staples to remain in place for 3-4 weeks Stump shrinker, for edema control after removal of VAC Scar mobilization massaging to prevent soft tissue adherence Stump protector during therapies Post surgical pain control with oral medication Phantom limb pain control with physical modalities  including desensitization techniques (gentle self massage to the residual stump,hot packs if sensation iintact, [place]) and mirror therapy, TENS. If ineffective, consider pharmacological treatment for neuropathic pain (e.g gabapentin, pregabalin, amytriptalyine, duloxetine).  When using wheelchair, patient should have knee on amputated side fully extended with board under the seat cushion. Avoid injury to contralateral side  1. Does the need for close, 24 hr/day medical supervision in concert with the patient's rehab needs make it unreasonable for this patient to be served in a less intensive setting? Yes  2. 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 Due to safety, skin/wound care, disease management, pain management and patient education, does the patient require 24 hr/day rehab nursing? Yes 4. Does the patient require coordinated care of a physician, rehab nurse, PT (1-2 hrs/day, 5 days/week) and OT (1-2 hrs/day, 5 days/week) to address physical and functional deficits in the context of the above medical diagnosis(es)? Yes Addressing deficits in the following areas: balance, endurance, locomotion, strength, transferring, bathing, dressing, toileting and psychosocial support 5. Can the patient actively participate in an intensive therapy program of at least 3 hrs of therapy per day at least 5 days per week? Yes 6. The potential for patient to make measurable gains while on  inpatient rehab is excellent 7. Anticipated functional outcomes upon discharge from inpatient rehab are Mod I at wheelchair level  with PT, Mod I at wheelchair level with OT, n/a with SLP. 8. Estimated rehab length of stay to reach the above functional goals is: 5-8 days. 9. Does the patient have adequate social supports and living environment to accommodate these discharge functional goals? Yes 10. Anticipated D/C setting: Home 11. Anticipated post D/C treatments: HH therapy and Home excercise program 12. Overall Rehab/Functional Prognosis: good  RECOMMENDATIONS: This patient's condition is appropriate for continued rehabilitative care in the following setting: CIR Patient has agreed to participate in recommended program. Yes Note that insurance prior authorization may be required for reimbursement for recommended care.  Comment: Rehab Admissions Coordinator to follow up.  Delice Lesch, MD 10/16/2015

## 2015-10-16 NOTE — Progress Notes (Signed)
CSW received consult regarding PT recommendation of SNF at discharge if CIR is unable to admit.  Patient is refusing SNF and would prefer to go home if CIR is unable to admit patient.  CSW signing off.   Percell Locus Shondra Capps LCSWA [phone]

## 2015-10-16 NOTE — Progress Notes (Signed)
Patient ID: [name], female   DOB: 04-Oct-1970, 45 y.o.   MRN: [id number]   Subjective: [name] feels her normal self this morning; she denies any chest pain, palpitations, or dyspnea. No complaints.  Objective: Vital signs in last 24 hours: Filed Vitals:   10/15/15 1429 10/15/15 1630 10/15/15 2108 10/16/15 0451  BP:   163/68 169/72  Pulse:  96 95 103  Temp:   98.7 F (37.1 C) 99.1 F (37.3 C)  TempSrc:   Oral Oral  Resp:   18 16  Height:      Weight:      SpO2: 98% 98% 100% 100%   General: resting in bed comfortably, appropriately conversational Cardiac: regular rate and rhythm, no rubs, gallops. Systolic murmur.  Pulm: breathing well, clear to auscultation bilaterally Ext: warm and well perfused, without pedal edema Skin: no rash, hair, or nail changes Neuro: alert and oriented X3, cranial nerves II-XII grossly intact, moving all extremities well  Lab Results: Basic Metabolic Panel:  Recent Labs Lab 10/10/15 0829 10/11/15 0251  10/14/15 0840 10/15/15 0507  NA 141 139  < > 136 137  K 3.7 3.5  < > 4.2 4.0  CL 107 102  --  99* 101  CO2 22 26  --  22 24  GLUCOSE 121* 153*  < > 181* 110*  BUN 11 9  --  9 11  CREATININE 1.19* 1.11*  --  1.60* 1.28*  CALCIUM 8.5* 8.4*  --  8.7* 8.7*  PHOS 3.2 3.1  --   --   --   < > = values in this interval not displayed.   CBC:  Recent Labs Lab 10/14/15 0840 10/15/15 0507  WBC 2.8* 3.2*  HGB 7.1* 8.6*  HCT 24.6* 27.5*  MCV 79.6 79.9  PLT 386 358   Cardiac Enzymes:  Recent Labs Lab 10/14/15 1829 10/15/15 0507  TROPONINI <0.03 <0.03   CBG:  Recent Labs Lab 10/15/15 0737 10/15/15 1156 10/15/15 1814 10/15/15 2106 10/16/15 0740 10/16/15 1132  GLUCAP 152* 127* 135* 175* 136* 93   Hemoglobin A1C:  Recent Labs Lab 10/13/15 1651  HGBA1C 8.3*    Studies/Results: Nm Pulmonary Perf And Vent  10/14/2015  CLINICAL DATA:  Recent lower extremity amputation, progressive postoperative shortness of breath  and hypoxia this morning EXAM: NUCLEAR MEDICINE VENTILATION - PERFUSION LUNG SCAN TECHNIQUE: Ventilation images were obtained in multiple projections using inhaled aerosol Tc-40m DTPA. Perfusion images were obtained in multiple projections after intravenous injection of Tc-64m MAA. RADIOPHARMACEUTICALS:  30.2 mCi Technetium-59m DTPA aerosol inhalation and 4.1 mCi Technetium-7m MAA IV COMPARISON:  10/14/2015 chest radiograph FINDINGS: Ventilation: Ventilation defect lateral right mid lung zone matching the perfusion defect described below. Perfusion: Subtle peripheral perfusion defect laterally in the right mid lung zone which does not correspond to a particular segment. There is a matched ventilation defect with no radiographic abnormality in this area. No other defects. IMPRESSION: Low probability V/Q scan. Electronically Signed   By: Skipper Cliche M.D.   On: 10/14/2015 14:44   Medications: I have reviewed the patient's current medications. Scheduled Meds: . amLODipine  10 mg Oral QHS  . antiseptic oral rinse  7 mL Mouth Rinse BID  . aspirin  81 mg Oral Daily  . enoxaparin (LOVENOX) injection  40 mg Subcutaneous Q24H  . insulin aspart  0-5 Units Subcutaneous QHS  . insulin aspart  0-9 Units Subcutaneous TID WC  . labetalol  300 mg Oral Daily  . mycophenolate  360  mg Oral QHS  . mycophenolate  540 mg Oral Daily  . pantoprazole  40 mg Oral Daily  . pravastatin  20 mg Oral QHS  . tacrolimus  1.5 mg Oral BID   Continuous Infusions:   PRN Meds:.acetaminophen **OR** acetaminophen, albuterol, oxyCODONE   Assessment/Plan:  [name] is a 45 year old lady status-post renal transplantion from hypertensive nephropathy, and severe peripheral vascular disease status-post right transtibial amputation on 10/13/2015, who was transferred to our service for further evaluation of pulseless asystole followed by new-onset hypoxia. Pulmonary embolus was ruled out with normal V/Q scan.  Pulseless asystole  followed by new-onset hypoxia: V/Q scan unremarkable. Troponins negative. No changes on EKG.  ECHO showed small to moderate pericardial effusion. Dynamic obstruction at rest in the mid cavity. Possible HOCM. Will consult cardiology for further recommendations.  -Titrate oxygen to room air -Ambulate and check oxygen  Dry gangrene status-post right transtibial amputation 10/13/2015: Pain well-controlled and physical therapy following. -Continue acetaminophen and oxycodone 5mg  every 4 hours as needed for pain -PT recommending CIR -OT consult  End-stage renal disease status-post renal transplant 2014: Her renal function appears to be stable. No need to follow BMPs. -Continue mycophenolate and tacrolimus  Anemia and leukopenia: She's been anemic and leukopenic since her renal transplant in 2014; this is most likely related to her immunosuppressants. I don't see a need to monitor daily CBCs.  Hypertension: Pressures well-controlled at 150s on her home regimen. -Continue labetalol 300mg  daily  Dispo: Disposition is deferred at this time, awaiting improvement of current medical problems.  Anticipated discharge in approximately 1-2 day(s).   The patient does have a current PCP Benito Mccreedy, MD) and does need an [facility] hospital follow-up appointment after discharge.  The patient does have transportation limitations that hinder transportation to clinic appointments.  .Services Needed at time of discharge: Y = Yes, Blank = No PT:   OT:   RN:   Equipment:   Other:       Maryellen Pile, MD 10/16/2015, 1:32 PM

## 2015-10-16 NOTE — Progress Notes (Signed)
Rehab admissions - I have opened the case with [facility] medicare requesting acute inpatient rehab admission.  I will follow up tomorrow after I hear back from insurance case manager.  Call me for questions.  [phone]

## 2015-10-16 NOTE — Progress Notes (Signed)
Patient ID: [name], female   DOB: 08-05-1970, 45 y.o.   MRN: [id number] Postoperative day 3 show part amputation right foot. Wound VAC is functioning well no drainage. Plan for discharge to skilled nursing nonweightbearing on the right lower extremity changed to the portable wound VAC at time of discharge leave this in place for 1 week.

## 2015-10-16 NOTE — Progress Notes (Signed)
UR COMPLETED  

## 2015-10-17 DIAGNOSIS — Z940 Kidney transplant status: Secondary | ICD-10-CM | POA: Diagnosis not present

## 2015-10-17 DIAGNOSIS — D72819 Decreased white blood cell count, unspecified: Secondary | ICD-10-CM | POA: Diagnosis not present

## 2015-10-17 DIAGNOSIS — N186 End stage renal disease: Secondary | ICD-10-CM | POA: Diagnosis not present

## 2015-10-17 DIAGNOSIS — Z95820 Peripheral vascular angioplasty status with implants and grafts: Secondary | ICD-10-CM | POA: Diagnosis not present

## 2015-10-17 DIAGNOSIS — D649 Anemia, unspecified: Secondary | ICD-10-CM | POA: Diagnosis not present

## 2015-10-17 DIAGNOSIS — I120 Hypertensive chronic kidney disease with stage 5 chronic kidney disease or end stage renal disease: Secondary | ICD-10-CM | POA: Diagnosis not present

## 2015-10-17 DIAGNOSIS — I739 Peripheral vascular disease, unspecified: Secondary | ICD-10-CM | POA: Diagnosis not present

## 2015-10-17 DIAGNOSIS — I469 Cardiac arrest, cause unspecified: Secondary | ICD-10-CM | POA: Diagnosis not present

## 2015-10-17 DIAGNOSIS — I10 Essential (primary) hypertension: Secondary | ICD-10-CM | POA: Diagnosis not present

## 2015-10-17 DIAGNOSIS — R0902 Hypoxemia: Secondary | ICD-10-CM | POA: Diagnosis not present

## 2015-10-17 DIAGNOSIS — Z89511 Acquired absence of right leg below knee: Secondary | ICD-10-CM | POA: Diagnosis not present

## 2015-10-17 DIAGNOSIS — I70261 Atherosclerosis of native arteries of extremities with gangrene, right leg: Secondary | ICD-10-CM | POA: Diagnosis not present

## 2015-10-17 LAB — GLUCOSE, CAPILLARY
Glucose-Capillary: 144 mg/dL — ABNORMAL HIGH (ref 65–99)
Glucose-Capillary: 78 mg/dL (ref 65–99)
Glucose-Capillary: 116 mg/dL — ABNORMAL HIGH (ref 65–99)
Glucose-Capillary: 158 mg/dL — ABNORMAL HIGH (ref 65–99)
Glucose-Capillary: 105 mg/dL — ABNORMAL HIGH (ref 65–99)
Glucose-Capillary: 116 mg/dL — ABNORMAL HIGH (ref 65–99)

## 2015-10-17 MED ORDER — LORATADINE 10 MG PO TABS
10.0000 mg | ORAL_TABLET | Freq: Every day | ORAL | Status: DC
  Administered 2015-10-17 – 2015-10-18 (×2): 10 mg via ORAL
  Filled 2015-10-17 (×2): qty 1

## 2015-10-17 NOTE — Clinical Social Work Placement (Signed)
   CLINICAL SOCIAL WORK PLACEMENT  NOTE  Date:  10/17/2015  Patient Details  Name: LAKKEN CIPOLLA MRN: [id number] Date of Birth: [birth year]  Clinical Social Work is seeking post-discharge placement for this patient at the [place] level of care (*CSW will initial, date and re-position this form in  chart as items are completed):      Patient/family provided with [place] Work Department's list of facilities offering this level of care within the geographic area requested by the patient (or if unable, by the patient's family).      Patient/family informed of their freedom to choose among providers that offer the needed level of care, that participate in Medicare, Medicaid or managed care program needed by the patient, have an available bed and are willing to accept the patient.      Patient/family informed of [facility]'s ownership interest in [facility] and [facility], as well as of the fact that they are under no obligation to receive care at these facilities.  PASRR submitted to EDS on 10/17/15     PASRR number received on 10/17/15     Existing PASRR number confirmed on       FL2 transmitted to all facilities in geographic area requested by pt/family on 10/17/15     FL2 transmitted to all facilities within larger geographic area on       Patient informed that his/her managed care company has contracts with or will negotiate with certain facilities, including the following:            Patient/family informed of bed offers received.  Patient chooses bed at       Physician recommends and patient chooses bed at      Patient to be transferred to   on  .  Patient to be transferred to facility by       Patient family notified on   of transfer.  Name of family member notified:        PHYSICIAN Please sign FL2     Additional Comment:    _______________________________________________ Benard Halsted, [place] 10/17/2015, 3:18 PM

## 2015-10-17 NOTE — Progress Notes (Signed)
Patient ID: [name], female   DOB: 1970/07/18, 45 y.o.   MRN: [id number]   Subjective: Patient complains of some allergy congestion today. Otherwise no complaints.   Objective: Vital signs in last 24 hours: Filed Vitals:   10/16/15 1505 10/16/15 2217 10/16/15 2336 10/17/15 0538  BP: 145/58 183/69 165/66 162/66  Pulse: 91 104 105 102  Temp: 98.9 F (37.2 C) 99.3 F (37.4 C)  99.2 F (37.3 C)  TempSrc:  Oral  Oral  Resp: 16 16  16   Height:      Weight:      SpO2: 99% 97%  97%   General:sitting in chair comfortably, appropriately conversational Cardiac: regular rate and rhythm, no rubs, gallops. Systolic murmur.  Pulm: breathing well, clear to auscultation bilaterally Ext: warm and well perfused, without pedal edema, R foot amputation with wound vac in place Skin: no rash, hair, or nail changes Neuro: alert and oriented X3, cranial nerves II-XII grossly intact, moving all extremities well  Lab Results: Basic Metabolic Panel:  Recent Labs Lab 10/10/15 0829 10/11/15 0251  10/14/15 0840 10/15/15 0507  NA 141 139  < > 136 137  K 3.7 3.5  < > 4.2 4.0  CL 107 102  --  99* 101  CO2 22 26  --  22 24  GLUCOSE 121* 153*  < > 181* 110*  BUN 11 9  --  9 11  CREATININE 1.19* 1.11*  --  1.60* 1.28*  CALCIUM 8.5* 8.4*  --  8.7* 8.7*  PHOS 3.2 3.1  --   --   --   < > = values in this interval not displayed.   CBC:  Recent Labs Lab 10/14/15 0840 10/15/15 0507  WBC 2.8* 3.2*  HGB 7.1* 8.6*  HCT 24.6* 27.5*  MCV 79.6 79.9  PLT 386 358   Cardiac Enzymes:  Recent Labs Lab 10/14/15 1829 10/15/15 0507  TROPONINI <0.03 <0.03   CBG:  Recent Labs Lab 10/15/15 2106 10/16/15 0740 10/16/15 1132 10/16/15 1712 10/16/15 2226 10/16/15 2335  GLUCAP 175* 136* 93 142* 78 144*   Hemoglobin A1C:  Recent Labs Lab 10/13/15 1651  HGBA1C 8.3*    Studies/Results: No results found. Medications: I have reviewed the patient's current medications. Scheduled Meds: .  amLODipine  10 mg Oral QHS  . antiseptic oral rinse  7 mL Mouth Rinse BID  . aspirin  81 mg Oral Daily  . enoxaparin (LOVENOX) injection  40 mg Subcutaneous Q24H  . insulin aspart  0-5 Units Subcutaneous QHS  . insulin aspart  0-9 Units Subcutaneous TID WC  . labetalol  300 mg Oral Daily  . mycophenolate  360 mg Oral QHS  . mycophenolate  540 mg Oral Daily  . pantoprazole  40 mg Oral Daily  . pravastatin  20 mg Oral QHS  . tacrolimus  1.5 mg Oral BID   Continuous Infusions:   PRN Meds:.acetaminophen **OR** acetaminophen, albuterol, oxyCODONE   Assessment/Plan:  [name] is a 45 year old lady status-post renal transplantion from hypertensive nephropathy, and severe peripheral vascular disease status-post right transtibial amputation on 10/13/2015, who was transferred to our service for further evaluation of pulseless asystole followed by new-onset hypoxia. Pulmonary embolus was ruled out with normal V/Q scan.  Pulseless asystole followed by new-onset hypoxia: V/Q scan unremarkable. Troponins negative. No changes on EKG.  ECHO showed small to moderate pericardial effusion. Dynamic obstruction at rest in the mid cavity. Possible HOCM. Will consult cardiology for further recommendations.  -Titrate  oxygen to room air -Appreciate cardiology help  Dry gangrene status-post right transtibial amputation 10/13/2015: Pain well-controlled and physical therapy following. -Continue acetaminophen and oxycodone 5mg  every 4 hours as needed for pain -PT recommending CIR, awaiting insurance approval  End-stage renal disease status-post renal transplant 2014: Her renal function appears to be stable. No need to follow BMPs. -Continue mycophenolate and tacrolimus  Anemia and leukopenia: She's been anemic and leukopenic since her renal transplant in 2014; this is most likely related to her immunosuppressants. I don't see a need to monitor daily CBCs.  Hypertension: Pressures well-controlled at 150s on her  home regimen. -Continue labetalol 300mg  daily  Dispo: Disposition is deferred at this time, awaiting improvement of current medical problems.  Anticipated discharge in approximately 1-2 day(s).   The patient does have a current PCP Joy Mccreedy, MD) and does need an [facility] follow-up appointment after discharge.  The patient does have transportation limitations that hinder transportation to clinic appointments.  .Services Needed at time of discharge: Y = Yes, Blank = No PT:   OT:   RN:   Equipment:   Other:       Maryellen Pile, MD 10/17/2015, 7:38 AM

## 2015-10-17 NOTE — Consult Note (Signed)
CARDIOLOGY CONSULT NOTE  Patient ID: [name] MRN: [id number] DOB/AGE: [birth year] 45 y.o.  Admit date: 10/13/2015 Referring Physician: Internal Medicine, Orthopedics Primary Physician:  Benito Mccreedy, MD Reason for Consultation: Episode of pulseless asytole  HPI: [name]  is a 45 y.o. female s/p renal transplantation due to diabetic and hypertensive renal disease on 01/23/2013 at [facility] and is presently under the care of transplant program and followed locally by Dr. Elmarie Shiley. She has HTN, orthostatic hypotension due to diabetic neuropathy, diabetic retinopathy, and PAD.  She returned to our office on 08/02/2015 reporting severe pain in both her legs, both at rest and also worsening with activity. She underwent peripheral arteriogram on 09/19/2015 with successful angioplasty to the in-flow of the right common iliac artery at the junction of external  and internal iliac artery. However, at the junction of the renal transplant artery anastamosis, there was still about a 70% or 80% stenosis with a 40-50 mmHg PG, however this could not be treated due to presence of renal transplant. The renal artery of the transplanted kidney itself was widely patent. At her follow up visit, she had reported worsening symptoms of right leg pain especially at the ankle.  She underwent repeat angiography on 10/06/2015 with successful but extremely difficult PTA and stenting of the right external iliac artery with implantation of a 6.0 x 39 mm Omnilink balloon expandable stent at 6 atmospheric pressure. The outflow of the stent had dissection and no flow, hence was overlapped with a 7.0 x 60 mm [employer] self-expanding stent. She also had a scoring balloon angioplasty of the right SFA, stented with a 5.0 x 80 mm Innova self-expanding stent. Over the following 2 days, she developed a clear-cut demarcation at the level of the midfoot and, instead of undergoing below-knee amputation, it was  felt that she may be a good candidate for transmetatarsal amputation on an elective fashion.   She returned on 10/13/2015 and underwent right chopart amputation with application of a Prevena wound VAC. The following day, she had told her nurse she was tired and wanted to take a nap. After laying in bed, she became unresponsive, pulseless, and asystolic. CPR was performed for approximately 2 minutes with ROSC. No drugs were administered, no shocks delivered. She denies any chest pain prior to or after event. Apparently reported SOB and was hypoxic with O2 sats in the 80s prior to event. No ischemic changes on EKG. Troponin negative. V/Q scan revealed low probability for PE.  Transfused with 1 unit PRBC with improvement in shortness of breath.  Denies any symptoms presently. Resting in bed comfortably with wound vac in place.   Past Medical History  Diagnosis Date  . Retinopathy   . Metabolic bone disease   . Anemia   . CHF (congestive heart failure) ([place])   . History of blood transfusion     related to "kidney transplant"  . Hypertension     sees Dr. Carolin Guernsey  . High cholesterol   . ESRD (end stage renal disease) on dialysis ([place]) 09/28/2011-01/23/2013    M-W-F; [place]  . Pneumonia 09/27/2011  . Type II diabetes mellitus ([place])     "controlled with diet"  . Critical ischemia of lower extremity hospitalized 10/05/2015    right  . Peripheral vascular disease ([place])   . Diabetic neuropathy [facility])      Past Surgical History  Procedure Laterality Date  . Insertion of dialysis catheter  10/04/2011    Procedure: INSERTION  OF DIALYSIS CATHETER;  Surgeon: Angelia Mould, MD;  Location: [place];  Service: Vascular;  Laterality: Right;  insertion of dialysis catheter right internal jugular  . Av fistula placement  10/04/2011    Procedure: INSERTION OF ARTERIOVENOUS (AV) GORE-TEX GRAFT ARM;  Surgeon: Angelia Mould, MD;  Location: [place];  Service: Vascular;  Laterality: Left;   Insertion left upper arm Arteriovenous goretex graft  . Avgg removal  10/04/2011    Procedure: REMOVAL OF ARTERIOVENOUS GORETEX GRAFT ([place]);  Surgeon: Elam Dutch, MD;  Location: [place];  Service: Vascular;  Laterality: Left;  . Av fistula placement  10/29/2011    Procedure: ARTERIOVENOUS (AV) FISTULA CREATION;  Surgeon: Angelia Mould, MD;  [facility] OR;  Service: Vascular;  Laterality: Right;  Creation Right Arteriovenous Fistula   . Revison of arteriovenous fistula  06/23/2012    Procedure: REVISON OF ARTERIOVENOUS FISTULA;  Surgeon: Angelia Mould, MD;  Location: [place];  Service: Vascular;  Laterality: Right;  Ultrasound guided  . Insertion of dialysis catheter  06/23/2012    Procedure: INSERTION OF DIALYSIS CATHETER;  Surgeon: Angelia Mould, MD;  Location: [place];  Service: Vascular;  Laterality: N/A;  Ultrasound guided  . Patch angioplasty  06/23/2012    Procedure: PATCH ANGIOPLASTY;  Surgeon: Angelia Mould, MD;  Location[facility] OR;  Service: Vascular;  Laterality: Right;  . Esophagogastroduodenoscopy N/A 12/24/2012    Procedure: ESOPHAGOGASTRODUODENOSCOPY (EGD);  Surgeon: Milus Banister, MD;  Location: [place];  Service: Endoscopy;  Laterality: N/A;  . Kidney transplant  01/24/2013  . Finger amputation Right 02/26/2013    "3rd finger"  . Shuntogram N/A 04/06/2012    Procedure: Earney Mallet;  Surgeon: Angelia Mould, MD;  Location[facility] CATH LAB;  Service: Cardiovascular;  Laterality: N/A;  . Peripheral vascular catheterization N/A 09/19/2015    Procedure: Lower Extremity Angiography;  Surgeon: Adrian Prows, MD;  Location: [place] CV LAB;  Service: Cardiovascular;  Laterality: N/A;  . Peripheral vascular catheterization N/A 09/19/2015    Procedure: Abdominal Aortogram;  Surgeon: Adrian Prows, MD;  Location: [place] CV LAB;  Service: Cardiovascular;  Laterality: N/A;  . Peripheral vascular catheterization Right 09/19/2015    Procedure: Peripheral Vascular  Intervention;  Surgeon: Adrian Prows, MD;  Location: [place] CV LAB;  Service: Cardiovascular;  Laterality: Right;  Right Common  Iliac  . Peripheral vascular catheterization N/A 10/06/2015    Procedure: Lower Extremity Angiography;  Surgeon: Adrian Prows, MD;  Location: [place] CV LAB;  Service: Cardiovascular;  Laterality: N/A;  . Peripheral vascular catheterization  10/06/2015    Procedure: Peripheral Vascular Intervention;  Surgeon: Adrian Prows, MD;  Location: [place] CV LAB;  Service: Cardiovascular;;  REIA  Omnilink 6.0x29, innova 7x80  RSFA innova 5x80  . Amputation Right 10/13/2015    Procedure: Right Transmetatarsal Amputation;  Surgeon: Newt Minion, MD;  Location: [place];  Service: Orthopedics;  Laterality: Right;     Family History  Problem Relation Age of Onset  . Malignant hyperthermia Mother   . Malignant hyperthermia Father   . Anesthesia problems Neg Hx   . Thyroid disease Mother   . Diabetes Brother   . Heart disease Brother     CHF  . Hypertension Mother   . Hypertension Father      Social History: Social History   Social History  . Marital Status: Single    Spouse Name: N/A  . Number of Children: 0  . Years of Education:  N/A   Occupational History  . Not on file.   Social History Main Topics  . Smoking status: Never Smoker   . Smokeless tobacco: Never Used  . Alcohol Use: No  . Drug Use: No  . Sexual Activity: No   Other Topics Concern  . Not on file   Social History Narrative     Prescriptions prior to admission  Medication Sig Dispense Refill Last Dose  . amLODipine (NORVASC) 10 MG tablet Take 10 mg by mouth at bedtime.    10/12/2015 at 2200  . aspirin 81 MG EC tablet Take 1 tablet (81 mg total) by mouth daily.   10/12/2015 at 2200  . ferrous sulfate 325 (65 FE) MG tablet Take 1 tablet (325 mg total) by mouth 3 (three) times daily with meals. 90 tablet 3 10/12/2015 at 2200  . insulin aspart (NOVOLOG) 100 UNIT/ML injection Inject 4-14 Units into  the skin 3 (three) times daily before meals. Per sliding scale: 100-150, 4 units. 151-200, 6 units. 201-250, 8 units. 251-300, 10 units. 301-350, 12 units. Over 350, 14 units.   10/12/2015 at 2200  . insulin detemir (LEVEMIR) 100 UNIT/ML injection Inject 16-22 Units into the skin See admin instructions. 16 units in the morning and 22 units at bedtime   10/13/2015 at 1200  . labetalol (NORMODYNE) 300 MG tablet Take 300 mg by mouth daily.    10/12/2015 at 2200  . mycophenolate (MYFORTIC) 180 MG EC tablet Take 360-540 mg by mouth 2 (two) times daily. 540 mg in the morning and 360 mg at night   10/13/2015 at 1200  . nitroGLYCERIN (NITRODUR - DOSED IN MG/24 HR) 0.2 mg/hr patch Place 1 patch (0.2 mg total) onto the skin daily. 30 patch 12 Past Week at Unknown time  . oxyCODONE-acetaminophen (PERCOCET/ROXICET) 5-325 MG tablet Take 1-2 tablets by mouth every 4 (four) hours as needed for severe pain.   Past Week at Unknown time  . pantoprazole (PROTONIX) 40 MG tablet Take 40 mg by mouth daily.   10/12/2015 at 2200  . pravastatin (PRAVACHOL) 20 MG tablet Take 20 mg by mouth at bedtime.    10/12/2015 at 2200  . sulfamethoxazole-trimethoprim (BACTRIM,SEPTRA) 400-80 MG per tablet Take 1 tablet by mouth every Monday, Wednesday, and Friday.    Past Week at Unknown time  . tacrolimus (PROGRAF) 0.5 MG capsule Take 0.5 mg by mouth 2 (two) times daily.   10/13/2015 at 1200  . tacrolimus (PROGRAF) 1 MG capsule Take 1.5 mg by mouth 2 (two) times daily.    10/13/2015 at 1200  . Vitamin D, Ergocalciferol, (DRISDOL) 50000 units CAPS capsule Take 50,000 Units by mouth every 7 (seven) days. Sundays   Past Week at Unknown time  . albuterol (PROVENTIL HFA;VENTOLIN HFA) 108 (90 BASE) MCG/ACT inhaler Inhale 1 puff into the lungs every 6 (six) hours as needed for wheezing or shortness of breath.   More than a month at Unknown time    ROS: General: no fevers/chills/night sweats Eyes: no blurry vision, diplopia, or amaurosis ENT: no sore  throat or hearing loss Resp: no cough, wheezing, or hemoptysis CV: no chest pain, edema or palpitations GI: no abdominal pain, nausea, vomiting, diarrhea, or constipation GU: no dysuria, frequency, or hematuria Skin: no rash Neuro: no headache, numbness, tingling, or weakness of extremities Musculoskeletal: no joint pain or swelling Heme: no bleeding, DVT, or easy bruising Endo: no polydipsia or polyuria    Physical Exam: Blood pressure 161/67, pulse 102, temperature 99.2 F (37.3  C), temperature source Oral, resp. rate 16, height 5' (1.524 m), weight 64.275 kg (141 lb 11.2 oz), last menstrual period 09/24/2015, SpO2 97 %.   General appearance: alert, cooperative, appears stated age and no distress Eyes: normal Neck: no adenopathy, no carotid bruit, no JVD, supple, symmetrical, trachea midline and thyroid not enlarged, symmetric, no tenderness/mass/nodules Resp: clear to auscultation bilaterally Chest wall: no tenderness Cardio: S1 and S2 is normal, I-II/VI SEM at RUSB. No gallop. GI: soft, non-tender; bowel sounds normal; no masses,no organomegaly Extremities: Right lower extremity: s/p fore and midfront foot amputation, wound vac in place, the right leg itself is warm and nontender to touch. No edema. Other extremities are normal. Evidence of chronic arterial insufficiency is evident in the left leg with shiny skin and absent hair.  Arterial exam: 2+ bilateral femoral pulses without bruit. Popliteal pulsenot felt bilaterally, absent left pedal pulse. Carotids normal.  Labs:   Lab Results  Component Value Date   WBC 3.2* 10/15/2015   HGB 8.6* 10/15/2015   HCT 27.5* 10/15/2015   MCV 79.9 10/15/2015   PLT 358 10/15/2015    Recent Labs Lab 10/15/15 0507  NA 137  K 4.0  CL 101  CO2 24  BUN 11  CREATININE 1.28*  CALCIUM 8.7*  GLUCOSE 110*    Lipid Panel     Component Value Date/Time   CHOL 176 03/30/2011 0158   TRIG 88 03/30/2011 0158   HDL 55 03/30/2011 0158    CHOLHDL 3.2 03/30/2011 0158   VLDL 18 03/30/2011 0158   LDLCALC 103* 03/30/2011 0158    BNP (last 3 results) No results for input(s): BNP in the last 8760 hours.  HEMOGLOBIN A1C Lab Results  Component Value Date   HGBA1C 8.3* 10/13/2015   MPG 192 10/13/2015    Cardiac Panel (last 3 results)  Recent Labs  10/14/15 1829 10/15/15 0507  TROPONINI <0.03 <0.03    Lab Results  Component Value Date   CKTOTAL 277* 03/30/2011   CKMB 4.0 03/30/2011   TROPONINI <0.03 10/15/2015     TSH No results for input(s): TSH in the last 8760 hours.  EKG 10/15/2015: Sinus rhythm at a rate of 93 bpm, normal axis, normal intervals, poor R-wave progression, cannot exclude anterior infarct old, diffuse nonspecific ST and T wave abnormality.  No significant change from prior EKGs.  Echocardiogram 10/14/2015: Moderate to severe LVH, LVEF 65-70%.  There is a dynamic obstruction at rest in the mid cavity, with peak velocity 150 cm/s and a peak gradient of 9 mmHg.  No regional wall motion abnormalities.  Small to moderate pericardial effusion.   Radiology: No results found.  Scheduled Meds: . amLODipine  10 mg Oral QHS  . antiseptic oral rinse  7 mL Mouth Rinse BID  . aspirin  81 mg Oral Daily  . enoxaparin (LOVENOX) injection  40 mg Subcutaneous Q24H  . insulin aspart  0-5 Units Subcutaneous QHS  . insulin aspart  0-9 Units Subcutaneous TID WC  . labetalol  300 mg Oral Daily  . mycophenolate  360 mg Oral QHS  . mycophenolate  540 mg Oral Daily  . pantoprazole  40 mg Oral Daily  . pravastatin  20 mg Oral QHS  . tacrolimus  1.5 mg Oral BID   Continuous Infusions:  PRN Meds:.acetaminophen **OR** acetaminophen, albuterol, oxyCODONE  ASSESSMENT AND PLAN:  1. Episode of pulseless asystole with ROSC after 2 min of CPR (10/14/2015) 2. S/P right chopart amputation due to dry gangrene of the right  foot due to diabetic PAD (10/13/2015) 3. Post-op anemia, transfused with 1 unit PRBC 4. PAD with  claudication bilateral lower extremities 5. History of renal transplant (at [facility] on 01/23/2013. Follows Elmarie Shiley, MD locally.) 6. Acute on chronic renal failure, resolved probably due to metabolic derangements and contrast nephropathy 7. Anemia of chronic disease, anemia due to blood loss from frequent blood draws. 8. Hypertension 9. Hyperlipidemia 10. Diabetes mellitus type 2 controlled  Recommendation: Episode of asystole likely due to post-op anemia. Has received PRBC transfusion. Cannot exclude CAD, however given normal cardiac enzymes and no acute changes on EKG, will likely continue medical therapy for now.  Presently asymptomatic. Dr. Einar Gip to see later today with further recommendations.  Rachel Bo, NP-C 10/17/2015, 8:33 AM [facility] [place] Pager: [phone] Office: [phone]

## 2015-10-17 NOTE — Progress Notes (Signed)
  Date: 10/17/2015  Patient name: [name]  Medical record number: [id number]  Date of birth: Mar 29, 1971   This patient has been seen and the plan of care was discussed with the house staff. Please see Dr. Shanna Cisco note for complete details. I concur with his findings.  Discussed case with CM for CIR.  Will attempt a Peer-to-Peer with insurance company tomorrow to see if we can get CIR improved.  She has excellent rehab potential.   Sid Falcon, MD 10/17/2015, 2:50 PM

## 2015-10-17 NOTE — NC FL2 (Signed)
[place] MEDICAID FL2 LEVEL OF CARE SCREENING TOOL     IDENTIFICATION  Patient Name: [name] Birthdate: May 19, 1971 Sex: female Admission Date (Current Location): 10/13/2015  [facility] and [place] Number:  Herbalist and Address:  [facility] [place]. [facility], [place] [address], [place], [place] [address]      Provider Number: [id number]  Attending Physician Name and Address:  Sid Falcon, MD  Relative Name and Phone Number:  Izora Gala, mother, [phone]    Current Level of Care: Hospital Recommended Level of Care: [place] Prior Approval Number:    Date Approved/Denied:   PASRR Number: [id number]  Discharge Plan: SNF    Current Diagnoses: Patient Active Problem List   Diagnosis Date Noted  . Asystole ([place])   . S/P unilateral BKA (below knee amputation) ([place])   . Pericardial effusion   . Type 2 diabetes mellitus with peripheral neuropathy (HCC)   . ESRD on dialysis ([place])   . Essential hypertension   . Anemia of chronic disease   . Acute blood loss anemia   . Gangrene associated with diabetes mellitus ([place]) 10/13/2015  . Critical lower limb ischemia 10/05/2015  . PAD (peripheral artery disease) ([place]) 09/18/2015  . Leukopenia 08/22/2014  . Protein-calorie malnutrition, severe ([place]) 12/24/2012  . Gastric ulcer 12/24/2012  . Mechanical complication of other vascular device, implant, and graft 09/22/2012  . End stage renal disease ([place]) 10/23/2011  . Physical deconditioning 10/09/2011  . HTN (hypertension), malignant 09/27/2011  . Chronic kidney disease, stage 4, severely decreased GFR (HCC) 09/27/2011  . PNA (pneumonia) 09/27/2011  . Acute respiratory failure with hypoxia ([place]) 09/27/2011  . VOMITING 12/06/2009  . GOUT 12/21/2008  . Uncontrolled diabetes mellitus with peripheral artery disease ([place]) 08/14/2006  . HYPERLIPIDEMIA 08/14/2006  . OBESITY, NOS 08/14/2006  . FIBROADENOSIS, BREAST 08/14/2006  . Irregular menstrual  cycle 08/14/2006    Orientation RESPIRATION BLADDER Height & Weight     Self, Time, Situation, Place  Normal Continent Weight: 141 lb 11.2 oz (64.275 kg) Height:  5' (152.4 cm)  BEHAVIORAL SYMPTOMS/MOOD NEUROLOGICAL BOWEL NUTRITION STATUS      Continent Diet (Please see DC summary)  AMBULATORY STATUS COMMUNICATION OF NEEDS Skin   Limited Assist Verbally Surgical wounds, Other (Comment) (Negative pressure room on foot; incision on foot;)                       Personal Care Assistance Level of Assistance  Bathing, Feeding, Dressing Bathing Assistance: Limited assistance Feeding assistance: Independent Dressing Assistance: Limited assistance     Functional Limitations Info             SPECIAL CARE FACTORS FREQUENCY  PT (By licensed PT)     PT Frequency: min 3x/week              Contractures      Additional Factors Info  Code Status, Allergies, Insulin Sliding Scale Code Status Info: Full Allergies Info: NKA   Insulin Sliding Scale Info: insulin aspart (novoLOG) injection 0-5 Units;insulin aspart (novoLOG) injection 0-9 Units;       Current Medications (10/17/2015):  This is the current hospital active medication list Current Facility-Administered Medications  Medication Dose Route Frequency Provider Last Rate Last Dose  . acetaminophen (TYLENOL) tablet 650 mg  650 mg Oral Q6H PRN Newt Minion, MD   650 mg at 10/16/15 0800   Or  . acetaminophen (TYLENOL) suppository 650 mg  650 mg Rectal Q6H  PRN Newt Minion, MD      . albuterol (PROVENTIL) (2.5 MG/3ML) 0.083% nebulizer solution 2.5 mg  2.5 mg Inhalation Q6H PRN Newt Minion, MD      . amLODipine (NORVASC) tablet 10 mg  10 mg Oral QHS Newt Minion, MD   10 mg at 10/16/15 2219  . antiseptic oral rinse (CPC / CETYLPYRIDINIUM CHLORIDE 0.05%) solution 7 mL  7 mL Mouth Rinse BID Newt Minion, MD   7 mL at 10/17/15 0800  . aspirin EC tablet 81 mg  81 mg Oral Daily Newt Minion, MD   81 mg at 10/17/15 0914  .  enoxaparin (LOVENOX) injection 40 mg  40 mg Subcutaneous Q24H Newt Minion, MD   40 mg at 10/17/15 0912  . insulin aspart (novoLOG) injection 0-5 Units  0-5 Units Subcutaneous QHS Loleta Chance, MD   0 Units at 10/14/15 2200  . insulin aspart (novoLOG) injection 0-9 Units  0-9 Units Subcutaneous TID WC Loleta Chance, MD   2 Units at 10/17/15 1247  . labetalol (NORMODYNE) tablet 300 mg  300 mg Oral Daily Newt Minion, MD   300 mg at 10/17/15 0914  . loratadine (CLARITIN) tablet 10 mg  10 mg Oral Daily Maryellen Pile, MD   10 mg at 10/17/15 1045  . mycophenolate (MYFORTIC) EC tablet 360 mg  360 mg Oral QHS Newt Minion, MD   360 mg at 10/16/15 2218  . mycophenolate (MYFORTIC) EC tablet 540 mg  540 mg Oral Daily Newt Minion, MD   540 mg at 10/17/15 0914  . oxyCODONE (Oxy IR/ROXICODONE) immediate release tablet 5 mg  5 mg Oral Q4H PRN Loleta Chance, MD   5 mg at 10/17/15 1357  . pantoprazole (PROTONIX) EC tablet 40 mg  40 mg Oral Daily Newt Minion, MD   40 mg at 10/17/15 0914  . pravastatin (PRAVACHOL) tablet 20 mg  20 mg Oral QHS Newt Minion, MD   20 mg at 10/16/15 2219  . tacrolimus (PROGRAF) capsule 1.5 mg  1.5 mg Oral BID Newt Minion, MD   1.5 mg at 10/17/15 0915     Discharge Medications: Please see discharge summary for a list of discharge medications.  Relevant Imaging Results:  Relevant Lab Results:   Additional Information SSN: [place], [place]

## 2015-10-17 NOTE — Clinical Social Work Note (Signed)
Clinical Social Work Assessment  Patient Details  Name: [name] MRN: [id number] Date of Birth: 13-Mar-1971  Date of referral:  10/17/15               Reason for consult:  Facility Placement                Permission sought to share information with:  Facility Art therapist granted to share information::  Yes, Verbal Permission Granted  Name::     Izora Gala  Agency::  [facility] SNFs  Relationship::  Mom  Contact Information:  [phone]  Housing/Transportation Living arrangements for the past 2 months:  [place] of Information:  Patient Patient Interpreter Needed:  None Criminal Activity/Legal Involvement Pertinent to Current Situation/Hospitalization:  No - Comment as needed Significant Relationships:  Parents Lives with:  Self Do you feel safe going back to the place where you live?  No Need for family participation in patient care:  Yes (Comment)  Care giving concerns:  Patient stated she is now willing to consider SNF option since CIR is unable to receive insurance authorization.    Social Worker assessment / plan:  CSW spoke with patient concerning possibility of rehab at [facility] before returning home.  Employment status:  Disabled (Comment on whether or not currently receiving Disability) Insurance information:  Programmer, applications PT Recommendations:  [place], Inpatient Rehab Consult Information / Referral to community resources:  [place]  Patient/Family's Response to care:  Patient recognizes need for rehab before returning home and is agreeable to a SNF in [place].   Patient/Family's Understanding of and Emotional Response to Diagnosis, Current Treatment, and Prognosis:  Patient is realistic regarding therapy needs. No questions/concerns about plan or treatment.    Emotional Assessment Appearance:  Appears stated age Attitude/Demeanor/Rapport:   (Appropriate) Affect (typically  observed):  Accepting, Appropriate Orientation:  Oriented to Situation, Oriented to  Time, Oriented to Place, Oriented to Self Alcohol / Substance use:  Not Applicable Psych involvement (Current and /or in the community):  No (Comment)  Discharge Needs  Concerns to be addressed:  Care Coordination Readmission within the last 30 days:  Yes Current discharge risk:  None Barriers to Discharge:  Continued Medical Work up   [employer], [place] 10/17/2015, 3:20 PM

## 2015-10-17 NOTE — Progress Notes (Signed)
Physical Therapy Treatment Patient Details Name: [name] MRN: [id number] DOB: June 04, 1971 Today's Date: 10/17/2015    History of Present Illness Pt is a 44 y/o F s/p Rt transtibial amputation.  On 4/29 pt had episode of SOB and hypoxia to mid 80s and then became unresponsive and pulseless.  She received chest compressions and regained a pulse.  Pt's PMH includes ESRD s/p renal transplant, retinopathy, anemia, CHF, diabetic neuropathy, Rt 3rd finger amputation.    PT Comments    Patient is progressing gradually toward mobility goals with decrease in pain and increased activity tolerance this session. Current plan remains appropriate.   Follow Up Recommendations  CIR     Equipment Recommendations  Rolling walker with 5" wheels    Recommendations for Other Services OT consult;Rehab consult     Precautions / Restrictions Precautions Precautions: Fall Precaution Comments: wound vac Restrictions Weight Bearing Restrictions: Yes RLE Weight Bearing: Non weight bearing    Mobility  Bed Mobility Overal bed mobility: Needs Assistance Bed Mobility: Supine to Sit     Supine to sit: Supervision     General bed mobility comments: supervision for safety and increased time  Transfers Overall transfer level: Needs assistance Equipment used: Rolling walker (2 wheeled) Transfers: Sit to/from Stand Sit to Stand: Min guard         General transfer comment: X2 from EOB and chair; cues for hand placement and safe use of DME; improved balance upon standing this sessoin  Ambulation/Gait Ambulation/Gait assistance: Min assist Ambulation Distance (Feet): 20 Feet (10X2) Assistive device: Rolling walker (2 wheeled) Gait Pattern/deviations: Step-to pattern     General Gait Details: cues for sequencing and posture; one seated rest break due to c/o pain but improved activity tolerance demonstrated    Stairs            Wheelchair Mobility    Modified Rankin (Stroke  Patients Only)       Balance Overall balance assessment: Needs assistance Sitting-balance support: No upper extremity supported;Feet supported Sitting balance-Leahy Scale: Fair     Standing balance support: Bilateral upper extremity supported Standing balance-Leahy Scale: Poor                      Cognition Arousal/Alertness: Awake/alert Behavior During Therapy: WFL for tasks assessed/performed Overall Cognitive Status: Within Functional Limits for tasks assessed                      Exercises General Exercises - Lower Extremity Quad Sets: AROM;Both;15 reps;Supine Hip ABduction/ADduction: AROM;Both;15 reps;Supine Straight Leg Raises: AROM;Both;15 reps;Supine    General Comments        Pertinent Vitals/Pain Pain Assessment: 0-10 Pain Score: 5  Pain Location: R LE in dependent position Pain Descriptors / Indicators: Aching;Grimacing;Throbbing Pain Intervention(s): Monitored during session;Repositioned;Patient requesting pain meds-RN notified    Home Living                      Prior Function            PT Goals (current goals can now be found in the care plan section) Acute Rehab PT Goals Patient Stated Goal: rehab before home PT Goal Formulation: With patient Time For Goal Achievement: 10/29/15 Potential to Achieve Goals: Good Progress towards PT goals: Progressing toward goals    Frequency  Min 4X/week    PT Plan Current plan remains appropriate    Co-evaluation  End of Session Equipment Utilized During Treatment: Gait belt;Oxygen Activity Tolerance: Patient tolerated treatment well Patient left: in chair;with call bell/phone within reach;with chair alarm set     Time: 0837-0901 PT Time Calculation (min) (ACUTE ONLY): 24 min  Charges:  $Gait Training: 8-22 mins $Therapeutic Exercise: 8-22 mins                    G Codes:  Functional Assessment Tool Used: Clinical Judgement Functional Limitation:  Mobility: Walking and moving around Mobility: Walking and Moving Around Current Status [phone]): At least 20 percent but less than 40 percent impaired, limited or restricted Mobility: Walking and Moving Around Goal Status [phone]): At least 1 percent but less than 20 percent impaired, limited or restricted   [place] April, PTA Pager: [phone]   10/17/2015, 9:12 AM

## 2015-10-17 NOTE — Progress Notes (Signed)
Rehab admissions - I have received a denial for acute inpatient rehab admission.  I have shared this with the resident.  If attending MD wishes to do a peer to peer with Psychologist, occupational, we need to do it before 3 pm tomorrow.  Let me know so that I can assist as needed.  Call me for questions.  [phone]

## 2015-10-17 NOTE — Progress Notes (Signed)
Patient ID: [name], female   DOB: Apr 15, 1971, 45 y.o.   MRN: [id number] Occupational therapy has recommended inpatient rehabilitation. We'll await insurance approval. Patient may be discharged to inpatient or outpatient rehabilitation.

## 2015-10-18 DIAGNOSIS — I129 Hypertensive chronic kidney disease with stage 1 through stage 4 chronic kidney disease, or unspecified chronic kidney disease: Secondary | ICD-10-CM | POA: Diagnosis not present

## 2015-10-18 DIAGNOSIS — H938X9 Other specified disorders of ear, unspecified ear: Secondary | ICD-10-CM | POA: Diagnosis not present

## 2015-10-18 DIAGNOSIS — R0902 Hypoxemia: Secondary | ICD-10-CM | POA: Diagnosis not present

## 2015-10-18 DIAGNOSIS — N183 Chronic kidney disease, stage 3 (moderate): Secondary | ICD-10-CM | POA: Diagnosis not present

## 2015-10-18 DIAGNOSIS — L97419 Non-pressure chronic ulcer of right heel and midfoot with unspecified severity: Secondary | ICD-10-CM | POA: Diagnosis not present

## 2015-10-18 DIAGNOSIS — Z89519 Acquired absence of unspecified leg below knee: Secondary | ICD-10-CM | POA: Diagnosis not present

## 2015-10-18 DIAGNOSIS — M6281 Muscle weakness (generalized): Secondary | ICD-10-CM | POA: Diagnosis not present

## 2015-10-18 DIAGNOSIS — N2581 Secondary hyperparathyroidism of renal origin: Secondary | ICD-10-CM | POA: Diagnosis not present

## 2015-10-18 DIAGNOSIS — I120 Hypertensive chronic kidney disease with stage 5 chronic kidney disease or end stage renal disease: Secondary | ICD-10-CM | POA: Diagnosis not present

## 2015-10-18 DIAGNOSIS — Z95820 Peripheral vascular angioplasty status with implants and grafts: Secondary | ICD-10-CM | POA: Diagnosis not present

## 2015-10-18 DIAGNOSIS — I10 Essential (primary) hypertension: Secondary | ICD-10-CM | POA: Diagnosis not present

## 2015-10-18 DIAGNOSIS — I469 Cardiac arrest, cause unspecified: Secondary | ICD-10-CM | POA: Diagnosis not present

## 2015-10-18 DIAGNOSIS — G9009 Other idiopathic peripheral autonomic neuropathy: Secondary | ICD-10-CM | POA: Diagnosis not present

## 2015-10-18 DIAGNOSIS — I96 Gangrene, not elsewhere classified: Secondary | ICD-10-CM | POA: Diagnosis not present

## 2015-10-18 DIAGNOSIS — Z89511 Acquired absence of right leg below knee: Secondary | ICD-10-CM | POA: Diagnosis not present

## 2015-10-18 DIAGNOSIS — N186 End stage renal disease: Secondary | ICD-10-CM | POA: Diagnosis not present

## 2015-10-18 DIAGNOSIS — I739 Peripheral vascular disease, unspecified: Secondary | ICD-10-CM | POA: Diagnosis not present

## 2015-10-18 DIAGNOSIS — D631 Anemia in chronic kidney disease: Secondary | ICD-10-CM | POA: Diagnosis not present

## 2015-10-18 DIAGNOSIS — N189 Chronic kidney disease, unspecified: Secondary | ICD-10-CM | POA: Diagnosis not present

## 2015-10-18 DIAGNOSIS — E119 Type 2 diabetes mellitus without complications: Secondary | ICD-10-CM | POA: Diagnosis not present

## 2015-10-18 DIAGNOSIS — Z940 Kidney transplant status: Secondary | ICD-10-CM | POA: Diagnosis not present

## 2015-10-18 DIAGNOSIS — I70261 Atherosclerosis of native arteries of extremities with gangrene, right leg: Secondary | ICD-10-CM | POA: Diagnosis not present

## 2015-10-18 LAB — GLUCOSE, CAPILLARY
Glucose-Capillary: 135 mg/dL — ABNORMAL HIGH (ref 65–99)
Glucose-Capillary: 119 mg/dL — ABNORMAL HIGH (ref 65–99)

## 2015-10-18 LAB — TYPE AND SCREEN
ABO/RH(D): A POS
Antibody Screen: NEGATIVE
Unit division: 0
Unit division: 0
Unit division: 0

## 2015-10-18 MED ORDER — INSULIN ASPART 100 UNIT/ML ~~LOC~~ SOLN: 0.0000 [IU] | Freq: Three times a day (TID) | SUBCUTANEOUS | Status: DC

## 2015-10-18 MED ORDER — HYDROMORPHONE HCL 1 MG/ML IJ SOLN
1.0000 mg | Freq: Once | INTRAMUSCULAR | Status: AC
  Administered 2015-10-18: 1 mg via INTRAVENOUS
  Filled 2015-10-18: qty 1

## 2015-10-18 MED ORDER — LABETALOL HCL 200 MG PO TABS: 200.0000 mg | ORAL_TABLET | Freq: Two times a day (BID) | ORAL | Status: DC

## 2015-10-18 MED ORDER — SENNOSIDES-DOCUSATE SODIUM 8.6-50 MG PO TABS: 1.0000 | ORAL_TABLET | Freq: Two times a day (BID) | ORAL | Status: DC

## 2015-10-18 MED ORDER — SENNOSIDES-DOCUSATE SODIUM 8.6-50 MG PO TABS
1.0000 | ORAL_TABLET | Freq: Two times a day (BID) | ORAL | Status: DC
  Administered 2015-10-18: 1 via ORAL
  Filled 2015-10-18: qty 1

## 2015-10-18 MED ORDER — ENOXAPARIN SODIUM 40 MG/0.4ML ~~LOC~~ SOLN: 40.0000 mg | SUBCUTANEOUS | Status: DC

## 2015-10-18 MED ORDER — ACETAMINOPHEN 325 MG PO TABS: 650.0000 mg | ORAL_TABLET | Freq: Four times a day (QID) | ORAL | Status: DC | PRN

## 2015-10-18 MED ORDER — LABETALOL HCL 200 MG PO TABS
200.0000 mg | ORAL_TABLET | Freq: Two times a day (BID) | ORAL | Status: DC
  Administered 2015-10-18: 200 mg via ORAL
  Filled 2015-10-18 (×2): qty 1

## 2015-10-18 MED FILL — Medication: Qty: 1 | Status: AC

## 2015-10-18 NOTE — Progress Notes (Signed)
Patient ID: [name], female   DOB: Jul 05, 1970, 45 y.o.   MRN: [id number]   Subjective: Patient with no complaints this morning.   Objective: Vital signs in last 24 hours: Filed Vitals:   10/17/15 0914 10/17/15 1345 10/17/15 2026 10/18/15 0422  BP: 161/67 138/66 163/65 166/67  Pulse: 102 87 98 99  Temp:  98.5 F (36.9 C) 98.5 F (36.9 C) 98.6 F (37 C)  TempSrc:  Oral Oral Oral  Resp:  18 17 16   Height:      Weight:      SpO2:  92% 90% 92%   General:sitting in chair comfortably, appropriately conversational Cardiac: regular rate and rhythm, no rubs, gallops. Systolic murmur.  Pulm: breathing well, clear to auscultation bilaterally Ext: warm and well perfused, without pedal edema, R foot amputation with wound vac in place Skin: no rash, hair, or nail changes Neuro: alert and oriented X3, cranial nerves II-XII grossly intact, moving all extremities well  Lab Results: Basic Metabolic Panel:  Recent Labs Lab 10/14/15 0840 10/15/15 0507  NA 136 137  K 4.2 4.0  CL 99* 101  CO2 22 24  GLUCOSE 181* 110*  BUN 9 11  CREATININE 1.60* 1.28*  CALCIUM 8.7* 8.7*     CBC:  Recent Labs Lab 10/14/15 0840 10/15/15 0507  WBC 2.8* 3.2*  HGB 7.1* 8.6*  HCT 24.6* 27.5*  MCV 79.6 79.9  PLT 386 358   Cardiac Enzymes:  Recent Labs Lab 10/14/15 1829 10/15/15 0507  TROPONINI <0.03 <0.03   CBG:  Recent Labs Lab 10/16/15 2335 10/17/15 0801 10/17/15 1154 10/17/15 1613 10/17/15 2035 10/18/15 0752  GLUCAP 144* 116* 158* 105* 116* 135*   Hemoglobin A1C:  Recent Labs Lab 10/13/15 1651  HGBA1C 8.3*    Studies/Results: No results found. Medications: I have reviewed the patient's current medications. Scheduled Meds: . amLODipine  10 mg Oral QHS  . antiseptic oral rinse  7 mL Mouth Rinse BID  . aspirin  81 mg Oral Daily  . enoxaparin (LOVENOX) injection  40 mg Subcutaneous Q24H  . insulin aspart  0-5 Units Subcutaneous QHS  . insulin aspart  0-9 Units  Subcutaneous TID WC  . labetalol  200 mg Oral BID  . loratadine  10 mg Oral Daily  . mycophenolate  360 mg Oral QHS  . mycophenolate  540 mg Oral Daily  . pantoprazole  40 mg Oral Daily  . pravastatin  20 mg Oral QHS  . tacrolimus  1.5 mg Oral BID   Continuous Infusions:   PRN Meds:.acetaminophen **OR** acetaminophen, albuterol, oxyCODONE   Assessment/Plan:  [name] is a 45 year old lady lady status-post renal transplantion from hypertensive nephropathy, and severe peripheral vascular disease status-post right transtibial amputation on 10/13/2015, who was transferred to our service for further evaluation of pulseless asystole followed by new-onset hypoxia. Pulmonary embolus was ruled out with normal V/Q scan.  Pulseless asystole followed by new-onset hypoxia: V/Q scan unremarkable. Troponins negative. No changes on EKG.  ECHO showed small to moderate pericardial effusion. Dynamic obstruction at rest in the mid cavity. -Appreciate cardiology's input.  -Titrate oxygen to room air  Dry gangrene status-post right transtibial amputation 10/13/2015: Pain well-controlled and physical therapy following. -Continue acetaminophen and oxycodone 5mg  every 4 hours as needed for pain -PT recommending CIR, insurance not accepted - appealing today -Social work on board for possible SNF placement  End-stage renal disease status-post renal transplant 2014: Her renal function appears to be stable. No need to follow BMPs. -  Continue mycophenolate and tacrolimus  Anemia and leukopenia: She's been anemic and leukopenic since her renal transplant in 2014; this is most likely related to her immunosuppressants. I don't see a need to monitor daily CBCs.  Hypertension: Pressures elevated 160s on her home regimen. -Change to labetalol 200mg  bid -Continue amlodpine 10  Dispo: Anticipate discharge today pending placement  The patient does have a current PCP Joy Mccreedy, MD) and does need an [facility] hospital  follow-up appointment after discharge.  The patient does have transportation limitations that hinder transportation to clinic appointments.  .Services Needed at time of discharge: Y = Yes, Blank = No PT:   OT:   RN:   Equipment:   Other:       Joy Pile, MD 10/18/2015, 8:24 AM

## 2015-10-18 NOTE — Progress Notes (Signed)
Occupational Therapy Treatment Patient Details Name: [name] MRN: [id number] DOB: [birth year] Today's Date: 10/18/2015    History of present illness Pt is a 45 y/o F s/p Rt transtibial amputation.  On 4/29 pt had episode of SOB and hypoxia to mid 80s and then became unresponsive and pulseless.  She received chest compressions and regained a pulse.  Pt's PMH includes ESRD s/p renal transplant, retinopathy, anemia, CHF, diabetic neuropathy, Rt 3rd finger amputation.   OT comments  Patient making great progress towards OT goals, continue plan of care for now. Continue to recommend CIR as pt is a great candidate for CIR, pt is making progress every day. See below under ADL comment for details regarding this OT session with PT co-treat towards end of session.    Follow Up Recommendations  CIR;Supervision/Assistance - 24 hour (SNF IF CIR denies)    Equipment Recommendations  Other (comment) (TBD next venue of care)    Recommendations for Other Services Rehab consult   Precautions / Restrictions Precautions Precautions: Fall Precaution Comments: wound vac Restrictions Weight Bearing Restrictions: Yes RLE Weight Bearing: Non weight bearing     Mobility Bed Mobility Overal bed mobility: Needs Assistance Bed Mobility: Supine to Sit     Supine to sit: Supervision     General bed mobility comments: supervision for safety and increased time, use of bed rails and HOB elevated slightly   Transfers Overall transfer level: Needs assistance Equipment used: Rolling walker (2 wheeled) Transfers: Sit to/from Stand Sit to Stand: Min guard         General transfer comment: Min guard for safety    Balance Overall balance assessment: Needs assistance Sitting-balance support: No upper extremity supported;Single extremity supported Sitting balance-Leahy Scale: Fair     Standing balance support: Bilateral upper extremity supported;During functional activity Standing balance-Leahy  Scale: Poor Standing balance comment: RW and min assist for support due to NWB to RLE   ADL Overall ADL's : Needs assistance/impaired Eating/Feeding: Set up;Sitting   Grooming: Set up;Sitting Grooming Details (indicate cue type and reason): sitting on BSC  Upper Body Bathing: Set up;Sitting   Lower Body Bathing: Minimal assistance;Sit to/from stand   Upper Body Dressing : Set up;Sitting   Lower Body Dressing: Minimal assistance;Sit to/from stand Lower Body Dressing Details (indicate cue type and reason): will require (A) to thread wound vac through clothing Toilet Transfer: Minimal assistance;Squat-pivot;BSC General ADL Comments: Pt found supine in bed and willing to work with therapist. Pt with 1/10 pain while in supine position. Pt engaged in bed mobility and sat EOB. Pt then transferred EOB to [facility]. Pt states she has been using bed pan for toileting needs, encouraged and recommended pt use BSC for toileting needs for various reasons and wrote recommendation on patient's white board. Pt completed grooming tasks and UB ADL while seated on BSC. Pt then engaged in pushups seated on BSC X10. With RLE in dependent position, pt with 3/10 complaints of pain. PT present for next part of session to work with patient on functional ambulation, focusing on ambulation, strength, endurance, and pain management. Pt with 4/10 pain at end of session.     Cognition   Behavior During Therapy: WFL for tasks assessed/performed Overall Cognitive Status: Within Functional Limits for tasks assessed                 Pertinent Vitals/ Pain       Pain Assessment: 0-10 Pain Score: 4  Pain Location: RLE in dependent position Pain Descriptors /  Indicators: Aching;Guarding;Grimacing;Throbbing Pain Intervention(s): Limited activity within patient's tolerance;Monitored during session;Repositioned   Frequency Min 2X/week     Progress Toward Goals  OT Goals(current goals can now befound in the care plan  section)  Progress towards OT goals: Progressing toward goals  Acute Rehab OT Goals Patient Stated Goal: rehab before home OT Goal Formulation: With patient Time For Goal Achievement: 10/30/15 Potential to Achieve Goals: Good  Plan Discharge plan remains appropriate    Co-evaluation    PT/OT/SLP Co-Evaluation/Treatment: Yes Reason for Co-Treatment: For patient/therapist safety   OT goals addressed during session: ADL's and self-care;Other (comment) (functional ambulation)      End of Session Equipment Utilized During Treatment: Gait belt;Rolling walker;Other (comment) (wound vac)   Activity Tolerance Patient tolerated treatment well   Patient Left in chair;Other (comment) (with PT present in room)     Time: [id number] OT Time Calculation (min): 34 min  Charges: OT General Charges $OT Visit: 1 Procedure OT Treatments $Self Care/Home Management : 8-22 mins  Chrys Racer , MS, OTR/L, CLT Pager: [phone]  10/18/2015, 11:06 AM

## 2015-10-18 NOTE — Discharge Summary (Addendum)
Physician Discharge Summary  Patient ID: [name] MRN: [id number] DOB/AGE: [birth year] 45 y.o.  Admit date: 10/13/2015 Discharge date: 10/18/2015  Admission Diagnoses:Gangrene right foot  Discharge Diagnoses:  Active Problems:   Gangrene associated with diabetes mellitus (HCC)   Asystole (HCC)   S/P unilateral BKA (below knee amputation) (HCC)   Pericardial effusion   Type 2 diabetes mellitus with peripheral neuropathy (HCC)   ESRD on dialysis [facility])   Essential hypertension   Anemia of chronic disease   Acute blood loss anemia  Chronic kidney disease stage IV. Patient is not on dialysis. Discharged Condition: stable  Hospital Course: Patient's hospital course essentially unremarkable she underwent [place] amputation right foot secondary to gangrene of the midfoot and forefoot. Postoperatively patient was attempted to be placed in inpatient rehabilitation insurance would not approve this so patient was discharged to outpatient rehabilitation.  Consults: [facility] internal medicine  Significant Diagnostic Studies: labs: Routine labs  Treatments: surgery: See operative note  Discharge Exam: Blood pressure 166/67, pulse 99, temperature 98.6 F (37 C), temperature source Oral, resp. rate 16, height 5' (1.524 m), weight 64.275 kg (141 lb 11.2 oz), last menstrual period 09/24/2015, SpO2 92 %. Incision/Wound: Wound VAC dressing clean and dry  Disposition: 01-Home or Self Care  Discharge Instructions    Apply cam walker    Complete by:  As directed   Laterality:  Right     Call MD / Call 911    Complete by:  As directed   If you experience chest pain or shortness of breath, CALL 911 and be transported to the hospital emergency room.  If you develope a fever above 101 F, pus (white drainage) or increased drainage or redness at the wound, or calf pain, call your surgeon's office.     Call MD / Call 911    Complete by:  As directed   If you experience chest pain or shortness  of breath, CALL 911 and be transported to the hospital emergency room.  If you develope a fever above 101.5 F, pus (white drainage) or increased drainage or redness at the wound, or calf pain, call your surgeon's office.     Call MD / Call 911    Complete by:  As directed   If you experience chest pain or shortness of breath, CALL 911 and be transported to the hospital emergency room.  If you develope a fever above 101 F, pus (white drainage) or increased drainage or redness at the wound, or calf pain, call your surgeon's office.     Constipation Prevention    Complete by:  As directed   Drink plenty of fluids.  Prune juice may be helpful.  You may use a stool softener, such as Colace (over the counter) 100 mg twice a day.  Use MiraLax (over the counter) for constipation as needed.     Constipation Prevention    Complete by:  As directed   Drink plenty of fluids.  Prune juice may be helpful.  You may use a stool softener, such as Colace (over the counter) 100 mg twice a day.  Use MiraLax (over the counter) for constipation as needed.     Constipation Prevention    Complete by:  As directed   Drink plenty of fluids.  Prune juice may be helpful.  You may use a stool softener, such as Colace (over the counter) 100 mg twice a day.  Use MiraLax (over the counter) for constipation as needed.  Diet - low sodium heart healthy    Complete by:  As directed      Diet - low sodium heart healthy    Complete by:  As directed      Diet - low sodium heart healthy    Complete by:  As directed      Diet general    Complete by:  As directed      Driving restrictions    Complete by:  As directed   No driving while taking narcotic pain meds.     Increase activity slowly as tolerated    Complete by:  As directed      Increase activity slowly as tolerated    Complete by:  As directed      Increase activity slowly as tolerated    Complete by:  As directed      Neg Press Wound Therapy / Incisional     Complete by:  As directed   Leave incisional wound VAC in place for 1 week. Changeover to dry dressing changes at that time.     Neg Press Wound Therapy / Incisional    Complete by:  As directed   Use the portable Prevena wound VAC pump for 1 week after discharge. Then wound VAC dressing can be removed and start dry dressing changes. If the pump alarms then discontinue the dressing.     Non weight bearing    Complete by:  As directed   Laterality:  right  Extremity:  Lower            Medication List    TAKE these medications        albuterol 108 (90 Base) MCG/ACT inhaler  Commonly known as:  PROVENTIL HFA;VENTOLIN HFA  Inhale 1 puff into the lungs every 6 (six) hours as needed for wheezing or shortness of breath.     amLODipine 10 MG tablet  Commonly known as:  NORVASC  Take 10 mg by mouth at bedtime.     aspirin 81 MG EC tablet  Take 1 tablet (81 mg total) by mouth daily.     enoxaparin 40 MG/0.4ML injection  Commonly known as:  LOVENOX  Inject 0.4 mLs (40 mg total) into the skin daily.     ferrous sulfate 325 (65 FE) MG tablet  Take 1 tablet (325 mg total) by mouth 3 (three) times daily with meals.     insulin aspart 100 UNIT/ML injection  Commonly known as:  novoLOG  Inject 4-14 Units into the skin 3 (three) times daily before meals. Per sliding scale: 100-150, 4 units. 151-200, 6 units. 201-250, 8 units. 251-300, 10 units. 301-350, 12 units. Over 350, 14 units.     insulin aspart 100 UNIT/ML injection  Commonly known as:  novoLOG  Inject 0-9 Units into the skin 3 (three) times daily with meals.     insulin detemir 100 UNIT/ML injection  Commonly known as:  LEVEMIR  Inject 16-22 Units into the skin See admin instructions. 16 units in the morning and 22 units at bedtime     labetalol 300 MG tablet  Commonly known as:  NORMODYNE  Take 300 mg by mouth daily.     mycophenolate 180 MG EC tablet  Commonly known as:  MYFORTIC  Take 360-540 mg by mouth 2 (two) times  daily. 540 mg in the morning and 360 mg at night     nitroGLYCERIN 0.2 mg/hr patch  Commonly known as:  NITRODUR - Dosed in mg/24 hr  Place 1 patch (0.2 mg total) onto the skin daily.     oxyCODONE-acetaminophen 5-325 MG tablet  Commonly known as:  PERCOCET/ROXICET  Take 1-2 tablets by mouth every 4 (four) hours as needed for severe pain.     oxyCODONE-acetaminophen 5-325 MG tablet  Commonly known as:  ROXICET  Take 1 tablet by mouth every 4 (four) hours as needed for severe pain.     pantoprazole 40 MG tablet  Commonly known as:  PROTONIX  Take 40 mg by mouth daily.     pravastatin 20 MG tablet  Commonly known as:  PRAVACHOL  Take 20 mg by mouth at bedtime.     sulfamethoxazole-trimethoprim 400-80 MG tablet  Commonly known as:  BACTRIM,SEPTRA  Take 1 tablet by mouth every Monday, Wednesday, and Friday.     tacrolimus 0.5 MG capsule  Commonly known as:  PROGRAF  Take 0.5 mg by mouth 2 (two) times daily.     tacrolimus 1 MG capsule  Commonly known as:  PROGRAF  Take 1.5 mg by mouth 2 (two) times daily.     Vitamin D (Ergocalciferol) 50000 units Caps capsule  Commonly known as:  DRISDOL  Take 50,000 Units by mouth every 7 (seven) days. Sundays           Follow-up Information    Follow up with Tonja Jezewski V, MD In 1 week.   Specialty:  Orthopedic Surgery   Contact information:   [place] [address] [phone]       Signed: Newt Minion 10/18/2015, 6:59 AM

## 2015-10-18 NOTE — Progress Notes (Signed)
Rehab admissions - I spoke with attending MD.  Currently rehab beds are full.  We will not pursue peer to peer since no rehab beds are available today.  Agree with proceeding with SNF placement.  Call me for questions.  [phone]

## 2015-10-18 NOTE — Progress Notes (Signed)
Physical Therapy Treatment Patient Details Name: [name] MRN: [id number] DOB: Nov 01, 1970 Today's Date: 10/18/2015    History of Present Illness Pt is a 45 y/o F s/p Rt transtibial amputation.  On 4/29 pt had episode of SOB and hypoxia to mid 80s and then became unresponsive and pulseless.  She received chest compressions and regained a pulse.  Pt's PMH includes ESRD s/p renal transplant, retinopathy, anemia, CHF, diabetic neuropathy, Rt 3rd finger amputation.    PT Comments    Patient continues to progress toward mobility goals and is motivated to participate in therapy. Continue to recommend CIR as pt is progressing each session and is a good candidate. Continue to progress as tolerated.   Follow Up Recommendations  CIR     Equipment Recommendations  Rolling walker with 5" wheels    Recommendations for Other Services OT consult;Rehab consult     Precautions / Restrictions Precautions Precautions: Fall Precaution Comments: wound vac Restrictions Weight Bearing Restrictions: Yes RLE Weight Bearing: Non weight bearing    Mobility  Bed Mobility Overal bed mobility: Needs Assistance Bed Mobility: Supine to Sit     Supine to sit: Supervision     General bed mobility comments: OOB in chair upon PT arrival   Transfers Overall transfer level: Needs assistance Equipment used: Rolling walker (2 wheeled) Transfers: Sit to/from Stand Sit to Stand: Min guard         General transfer comment: min guard for safety; cues for hand placement when descending to chair  Ambulation/Gait Ambulation/Gait assistance: Min guard;Min assist;+2 safety/equipment Ambulation Distance (Feet): 45 Feet Assistive device: Rolling walker (2 wheeled) Gait Pattern/deviations: Step-to pattern     General Gait Details: cues for posture; min A at times for balance when fatigued; seated rest breaks X3 due to c/o pain and fatigue   Stairs            Wheelchair Mobility    Modified  Rankin (Stroke Patients Only)       Balance Overall balance assessment: Needs assistance Sitting-balance support: Feet supported;No upper extremity supported Sitting balance-Leahy Scale: Good     Standing balance support: Bilateral upper extremity supported Standing balance-Leahy Scale: Poor Standing balance comment: RW and min assist for support due to NWB to RLE                    Cognition Arousal/Alertness: Awake/alert Behavior During Therapy: WFL for tasks assessed/performed Overall Cognitive Status: Within Functional Limits for tasks assessed                      Exercises General Exercises - Lower Extremity Quad Sets: AROM;Both;15 reps;Seated Hip ABduction/ADduction: AROM;Both;15 reps;Seated Straight Leg Raises: AROM;Both;10 reps;Seated Hip Flexion/Marching: AROM;Both;15 reps;Seated    General Comments        Pertinent Vitals/Pain Pain Assessment: 0-10 Pain Score: 4  Pain Location: R LE in dependent position Pain Descriptors / Indicators: Aching;Throbbing Pain Intervention(s): Limited activity within patient's tolerance;Monitored during session;Repositioned;Patient requesting pain meds-RN notified    Home Living                      Prior Function            PT Goals (current goals can now be found in the care plan section) Acute Rehab PT Goals Patient Stated Goal: rehab before home Progress towards PT goals: Progressing toward goals    Frequency  Min 4X/week    PT Plan Current plan remains appropriate  Co-evaluation PT/OT/SLP Co-Evaluation/Treatment: Yes Reason for Co-Treatment: For patient/therapist safety PT goals addressed during session: Mobility/safety with mobility;Proper use of DME OT goals addressed during session: ADL's and self-care;Other (comment) (functional ambulation)     End of Session Equipment Utilized During Treatment: Gait belt Activity Tolerance: Patient tolerated treatment well Patient left: in  chair;with call bell/phone within reach;with chair alarm set     Time: [id number] PT Time Calculation (min) (ACUTE ONLY): 26 min  Charges:  $Gait Training: 8-22 mins $Therapeutic Exercise: 8-22 mins                    G Codes:      Salina April, PTA Pager: [phone]   10/18/2015, 1:04 PM

## 2015-10-18 NOTE — Progress Notes (Signed)
  Date: 10/18/2015  Patient name: [name]  Medical record number: [id number]  Date of birth: 12/20/1970   This patient has been seen and the plan of care was discussed with the house staff. Please see Dr. Shanna Cisco note for complete details. I concur with his findings with the following additions/corrections: No appeal for CIR today as there are no beds available.  She has been accepted to 4 SNF's and will choose one today.  Likely discharge tomorrow.   Sid Falcon, MD 10/18/2015, 3:40 PM

## 2015-10-18 NOTE — Progress Notes (Signed)
Patient will discharge to [facility] Anticipated discharge date:5/3 Family notified: pt parents Transportation by [facility]- called at 4:50pm  CSW signing off.  Domenica Reamer, [place] Social Worker [phone]

## 2015-10-18 NOTE — Progress Notes (Signed)
NURSING PROGRESS NOTE  [name] [id number] Discharge Data: 10/18/2015 5:45 PM Attending Provider: Sid Falcon, MD [id number], MD     [name] to be D/C'd [facility] per MD order.  Discussed with the patient the After Visit Summary and all questions fully answered. All IV's discontinued with no bleeding noted. All belongings returned to patient for patient to take home. Pt being transported to [place] [facility] via [place]. Wound vac was removed and dry dressing was placed on wound. Report given to [name] at [facility] [facility].   Last Vital Signs:  Blood pressure 155/61, pulse 95, temperature 99.1 F (37.3 C), temperature source Oral, resp. rate 19, height 5' (1.524 m), weight 64.275 kg (141 lb 11.2 oz), last menstrual period 09/24/2015, SpO2 97 %.  Discharge Medication List   Medication List    TAKE these medications        acetaminophen 325 MG tablet  Commonly known as:  TYLENOL  Take 2 tablets (650 mg total) by mouth every 6 (six) hours as needed for mild pain (or Fever >/= 101).     albuterol 108 (90 Base) MCG/ACT inhaler  Commonly known as:  PROVENTIL HFA;VENTOLIN HFA  Inhale 1 puff into the lungs every 6 (six) hours as needed for wheezing or shortness of breath.     amLODipine 10 MG tablet  Commonly known as:  NORVASC  Take 10 mg by mouth at bedtime.     aspirin 81 MG EC tablet  Take 1 tablet (81 mg total) by mouth daily.     enoxaparin 40 MG/0.4ML injection  Commonly known as:  LOVENOX  Inject 0.4 mLs (40 mg total) into the skin daily.     ferrous sulfate 325 (65 FE) MG tablet  Take 1 tablet (325 mg total) by mouth 3 (three) times daily with meals.     insulin aspart 100 UNIT/ML injection  Commonly known as:  novoLOG  Inject 4-14 Units into the skin 3 (three) times daily before meals. Per sliding scale: 100-150, 4 units. 151-200, 6 units. 201-250, 8 units. 251-300, 10 units. 301-350, 12 units. Over 350, 14 units.      insulin aspart 100 UNIT/ML injection  Commonly known as:  novoLOG  Inject 0-9 Units into the skin 3 (three) times daily with meals.     insulin detemir 100 UNIT/ML injection  Commonly known as:  LEVEMIR  Inject 16-22 Units into the skin See admin instructions. 16 units in the morning and 22 units at bedtime     labetalol 200 MG tablet  Commonly known as:  NORMODYNE  Take 1 tablet (200 mg total) by mouth 2 (two) times daily.     mycophenolate 180 MG EC tablet  Commonly known as:  MYFORTIC  Take 360-540 mg by mouth 2 (two) times daily. 540 mg in the morning and 360 mg at night     nitroGLYCERIN 0.2 mg/hr patch  Commonly known as:  NITRODUR - Dosed in mg/24 hr  Place 1 patch (0.2 mg total) onto the skin daily.     oxyCODONE-acetaminophen 5-325 MG tablet  Commonly known as:  PERCOCET/ROXICET  Take 1-2 tablets by mouth every 4 (four) hours as needed for severe pain.     oxyCODONE-acetaminophen 5-325 MG tablet  Commonly known as:  ROXICET  Take 1 tablet by mouth every 4 (four) hours as needed for severe pain.     pantoprazole 40 MG tablet  Commonly known as:  PROTONIX  Take 40  mg by mouth daily.     pravastatin 20 MG tablet  Commonly known as:  PRAVACHOL  Take 20 mg by mouth at bedtime.     senna-docusate 8.6-50 MG tablet  Commonly known as:  Senokot-S  Take 1 tablet by mouth 2 (two) times daily.     sulfamethoxazole-trimethoprim 400-80 MG tablet  Commonly known as:  BACTRIM,SEPTRA  Take 1 tablet by mouth every Monday, Wednesday, and Friday.     tacrolimus 0.5 MG capsule  Commonly known as:  PROGRAF  Take 0.5 mg by mouth 2 (two) times daily.     tacrolimus 1 MG capsule  Commonly known as:  PROGRAF  Take 1.5 mg by mouth 2 (two) times daily.     Vitamin D (Ergocalciferol) 50000 units Caps capsule  Commonly known as:  DRISDOL  Take 50,000 Units by mouth every 7 (seven) days. Sundays

## 2015-10-20 DIAGNOSIS — L97419 Non-pressure chronic ulcer of right heel and midfoot with unspecified severity: Secondary | ICD-10-CM | POA: Diagnosis not present

## 2015-10-23 DIAGNOSIS — Z940 Kidney transplant status: Secondary | ICD-10-CM | POA: Diagnosis not present

## 2015-10-23 DIAGNOSIS — I10 Essential (primary) hypertension: Secondary | ICD-10-CM | POA: Diagnosis not present

## 2015-10-23 DIAGNOSIS — I739 Peripheral vascular disease, unspecified: Secondary | ICD-10-CM | POA: Diagnosis not present

## 2015-10-23 NOTE — Progress Notes (Signed)
Pharmacy called asking about lovenox script. Called Dr Sharol Given the prescribing provider. He does not want lovenox. Called pharmacy spoke with [facility] stating Dr Sharol Given did not want to order lovenox. Ordered DC. Joslyn Hy, MSN, RN, [employer]

## 2015-10-30 DIAGNOSIS — H938X9 Other specified disorders of ear, unspecified ear: Secondary | ICD-10-CM | POA: Diagnosis not present

## 2015-10-31 DIAGNOSIS — L97419 Non-pressure chronic ulcer of right heel and midfoot with unspecified severity: Secondary | ICD-10-CM | POA: Diagnosis not present

## 2015-10-31 DIAGNOSIS — I739 Peripheral vascular disease, unspecified: Secondary | ICD-10-CM | POA: Diagnosis not present

## 2015-10-31 DIAGNOSIS — E119 Type 2 diabetes mellitus without complications: Secondary | ICD-10-CM | POA: Diagnosis not present

## 2015-11-01 DIAGNOSIS — D631 Anemia in chronic kidney disease: Secondary | ICD-10-CM | POA: Diagnosis not present

## 2015-11-01 DIAGNOSIS — N183 Chronic kidney disease, stage 3 (moderate): Secondary | ICD-10-CM | POA: Diagnosis not present

## 2015-11-01 DIAGNOSIS — I129 Hypertensive chronic kidney disease with stage 1 through stage 4 chronic kidney disease, or unspecified chronic kidney disease: Secondary | ICD-10-CM | POA: Diagnosis not present

## 2015-11-01 DIAGNOSIS — N2581 Secondary hyperparathyroidism of renal origin: Secondary | ICD-10-CM | POA: Diagnosis not present

## 2015-11-03 DIAGNOSIS — I10 Essential (primary) hypertension: Secondary | ICD-10-CM | POA: Diagnosis not present

## 2015-11-03 DIAGNOSIS — Z89519 Acquired absence of unspecified leg below knee: Secondary | ICD-10-CM | POA: Diagnosis not present

## 2015-11-03 DIAGNOSIS — N186 End stage renal disease: Secondary | ICD-10-CM | POA: Diagnosis not present

## 2015-11-03 DIAGNOSIS — E119 Type 2 diabetes mellitus without complications: Secondary | ICD-10-CM | POA: Diagnosis not present

## 2015-11-03 DIAGNOSIS — I96 Gangrene, not elsewhere classified: Secondary | ICD-10-CM | POA: Diagnosis not present

## 2015-11-06 DIAGNOSIS — J1520 Pneumonia due to staphylococcus, unspecified: Secondary | ICD-10-CM | POA: Diagnosis not present

## 2015-11-06 DIAGNOSIS — M6281 Muscle weakness (generalized): Secondary | ICD-10-CM | POA: Diagnosis not present

## 2015-11-06 DIAGNOSIS — I10 Essential (primary) hypertension: Secondary | ICD-10-CM | POA: Diagnosis not present

## 2015-11-07 DIAGNOSIS — I10 Essential (primary) hypertension: Secondary | ICD-10-CM | POA: Diagnosis not present

## 2015-11-08 ENCOUNTER — Other Ambulatory Visit (HOSPITAL_COMMUNITY): Payer: Self-pay | Admitting: *Deleted

## 2015-11-08 DIAGNOSIS — I10 Essential (primary) hypertension: Secondary | ICD-10-CM | POA: Diagnosis not present

## 2015-11-09 ENCOUNTER — Ambulatory Visit (HOSPITAL_COMMUNITY)
Admission: RE | Admit: 2015-11-09 | Discharge: 2015-11-09 | Disposition: A | Discharge status: Home / Self Care | Payer: Medicare Other | Source: Ambulatory Visit | Attending: Nephrology | Admitting: Nephrology

## 2015-11-09 DIAGNOSIS — D509 Iron deficiency anemia, unspecified: Secondary | ICD-10-CM | POA: Diagnosis not present

## 2015-11-09 MED ORDER — SODIUM CHLORIDE 0.9 % IV SOLN
510.0000 mg | INTRAVENOUS | Status: DC
  Administered 2015-11-09: 510 mg via INTRAVENOUS
  Filled 2015-11-09: qty 17

## 2015-11-14 DIAGNOSIS — I10 Essential (primary) hypertension: Secondary | ICD-10-CM | POA: Diagnosis not present

## 2015-11-15 DIAGNOSIS — I10 Essential (primary) hypertension: Secondary | ICD-10-CM | POA: Diagnosis not present

## 2015-11-16 ENCOUNTER — Ambulatory Visit (HOSPITAL_COMMUNITY)
Admission: RE | Admit: 2015-11-16 | Discharge: 2015-11-16 | Disposition: A | Discharge status: Home / Self Care | Payer: Medicare Other | Source: Ambulatory Visit | Attending: Nephrology | Admitting: Nephrology

## 2015-11-16 DIAGNOSIS — D72819 Decreased white blood cell count, unspecified: Secondary | ICD-10-CM | POA: Diagnosis not present

## 2015-11-16 DIAGNOSIS — Z794 Long term (current) use of insulin: Secondary | ICD-10-CM | POA: Diagnosis not present

## 2015-11-16 DIAGNOSIS — Z4781 Encounter for orthopedic aftercare following surgical amputation: Secondary | ICD-10-CM | POA: Diagnosis not present

## 2015-11-16 DIAGNOSIS — D509 Iron deficiency anemia, unspecified: Secondary | ICD-10-CM | POA: Diagnosis not present

## 2015-11-16 DIAGNOSIS — E1122 Type 2 diabetes mellitus with diabetic chronic kidney disease: Secondary | ICD-10-CM | POA: Diagnosis not present

## 2015-11-16 DIAGNOSIS — E1151 Type 2 diabetes mellitus with diabetic peripheral angiopathy without gangrene: Secondary | ICD-10-CM | POA: Diagnosis not present

## 2015-11-16 DIAGNOSIS — Z940 Kidney transplant status: Secondary | ICD-10-CM | POA: Diagnosis not present

## 2015-11-16 DIAGNOSIS — N184 Chronic kidney disease, stage 4 (severe): Secondary | ICD-10-CM | POA: Diagnosis not present

## 2015-11-16 DIAGNOSIS — I129 Hypertensive chronic kidney disease with stage 1 through stage 4 chronic kidney disease, or unspecified chronic kidney disease: Secondary | ICD-10-CM | POA: Diagnosis not present

## 2015-11-16 DIAGNOSIS — Z89511 Acquired absence of right leg below knee: Secondary | ICD-10-CM | POA: Diagnosis not present

## 2015-11-16 DIAGNOSIS — Z4801 Encounter for change or removal of surgical wound dressing: Secondary | ICD-10-CM | POA: Diagnosis not present

## 2015-11-16 DIAGNOSIS — D631 Anemia in chronic kidney disease: Secondary | ICD-10-CM | POA: Diagnosis not present

## 2015-11-16 DIAGNOSIS — E1142 Type 2 diabetes mellitus with diabetic polyneuropathy: Secondary | ICD-10-CM | POA: Diagnosis not present

## 2015-11-16 DIAGNOSIS — Z7982 Long term (current) use of aspirin: Secondary | ICD-10-CM | POA: Diagnosis not present

## 2015-11-16 MED ORDER — FERUMOXYTOL INJECTION 510 MG/17 ML
510.0000 mg | INTRAVENOUS | Status: AC
  Administered 2015-11-16: 510 mg via INTRAVENOUS
  Filled 2015-11-16: qty 17

## 2015-11-17 DIAGNOSIS — Z794 Long term (current) use of insulin: Secondary | ICD-10-CM | POA: Diagnosis not present

## 2015-11-17 DIAGNOSIS — D72819 Decreased white blood cell count, unspecified: Secondary | ICD-10-CM | POA: Diagnosis not present

## 2015-11-17 DIAGNOSIS — Z7982 Long term (current) use of aspirin: Secondary | ICD-10-CM | POA: Diagnosis not present

## 2015-11-17 DIAGNOSIS — E1151 Type 2 diabetes mellitus with diabetic peripheral angiopathy without gangrene: Secondary | ICD-10-CM | POA: Diagnosis not present

## 2015-11-17 DIAGNOSIS — Z4801 Encounter for change or removal of surgical wound dressing: Secondary | ICD-10-CM | POA: Diagnosis not present

## 2015-11-17 DIAGNOSIS — D631 Anemia in chronic kidney disease: Secondary | ICD-10-CM | POA: Diagnosis not present

## 2015-11-17 DIAGNOSIS — N184 Chronic kidney disease, stage 4 (severe): Secondary | ICD-10-CM | POA: Diagnosis not present

## 2015-11-17 DIAGNOSIS — I129 Hypertensive chronic kidney disease with stage 1 through stage 4 chronic kidney disease, or unspecified chronic kidney disease: Secondary | ICD-10-CM | POA: Diagnosis not present

## 2015-11-17 DIAGNOSIS — E1122 Type 2 diabetes mellitus with diabetic chronic kidney disease: Secondary | ICD-10-CM | POA: Diagnosis not present

## 2015-11-17 DIAGNOSIS — Z940 Kidney transplant status: Secondary | ICD-10-CM | POA: Diagnosis not present

## 2015-11-17 DIAGNOSIS — E1142 Type 2 diabetes mellitus with diabetic polyneuropathy: Secondary | ICD-10-CM | POA: Diagnosis not present

## 2015-11-17 DIAGNOSIS — Z89511 Acquired absence of right leg below knee: Secondary | ICD-10-CM | POA: Diagnosis not present

## 2015-11-17 DIAGNOSIS — Z4781 Encounter for orthopedic aftercare following surgical amputation: Secondary | ICD-10-CM | POA: Diagnosis not present

## 2015-11-20 DIAGNOSIS — Z794 Long term (current) use of insulin: Secondary | ICD-10-CM | POA: Diagnosis not present

## 2015-11-20 DIAGNOSIS — E1151 Type 2 diabetes mellitus with diabetic peripheral angiopathy without gangrene: Secondary | ICD-10-CM | POA: Diagnosis not present

## 2015-11-20 DIAGNOSIS — I129 Hypertensive chronic kidney disease with stage 1 through stage 4 chronic kidney disease, or unspecified chronic kidney disease: Secondary | ICD-10-CM | POA: Diagnosis not present

## 2015-11-20 DIAGNOSIS — D72819 Decreased white blood cell count, unspecified: Secondary | ICD-10-CM | POA: Diagnosis not present

## 2015-11-20 DIAGNOSIS — E1142 Type 2 diabetes mellitus with diabetic polyneuropathy: Secondary | ICD-10-CM | POA: Diagnosis not present

## 2015-11-20 DIAGNOSIS — Z89511 Acquired absence of right leg below knee: Secondary | ICD-10-CM | POA: Diagnosis not present

## 2015-11-20 DIAGNOSIS — N184 Chronic kidney disease, stage 4 (severe): Secondary | ICD-10-CM | POA: Diagnosis not present

## 2015-11-20 DIAGNOSIS — Z4801 Encounter for change or removal of surgical wound dressing: Secondary | ICD-10-CM | POA: Diagnosis not present

## 2015-11-20 DIAGNOSIS — E1122 Type 2 diabetes mellitus with diabetic chronic kidney disease: Secondary | ICD-10-CM | POA: Diagnosis not present

## 2015-11-20 DIAGNOSIS — Z7982 Long term (current) use of aspirin: Secondary | ICD-10-CM | POA: Diagnosis not present

## 2015-11-20 DIAGNOSIS — Z940 Kidney transplant status: Secondary | ICD-10-CM | POA: Diagnosis not present

## 2015-11-20 DIAGNOSIS — D631 Anemia in chronic kidney disease: Secondary | ICD-10-CM | POA: Diagnosis not present

## 2015-11-20 DIAGNOSIS — Z4781 Encounter for orthopedic aftercare following surgical amputation: Secondary | ICD-10-CM | POA: Diagnosis not present

## 2015-11-22 DIAGNOSIS — I129 Hypertensive chronic kidney disease with stage 1 through stage 4 chronic kidney disease, or unspecified chronic kidney disease: Secondary | ICD-10-CM | POA: Diagnosis not present

## 2015-11-22 DIAGNOSIS — Z794 Long term (current) use of insulin: Secondary | ICD-10-CM | POA: Diagnosis not present

## 2015-11-22 DIAGNOSIS — Z940 Kidney transplant status: Secondary | ICD-10-CM | POA: Diagnosis not present

## 2015-11-22 DIAGNOSIS — E1122 Type 2 diabetes mellitus with diabetic chronic kidney disease: Secondary | ICD-10-CM | POA: Diagnosis not present

## 2015-11-22 DIAGNOSIS — D72819 Decreased white blood cell count, unspecified: Secondary | ICD-10-CM | POA: Diagnosis not present

## 2015-11-22 DIAGNOSIS — D631 Anemia in chronic kidney disease: Secondary | ICD-10-CM | POA: Diagnosis not present

## 2015-11-22 DIAGNOSIS — I10 Essential (primary) hypertension: Secondary | ICD-10-CM | POA: Diagnosis not present

## 2015-11-22 DIAGNOSIS — Z89511 Acquired absence of right leg below knee: Secondary | ICD-10-CM | POA: Diagnosis not present

## 2015-11-22 DIAGNOSIS — N184 Chronic kidney disease, stage 4 (severe): Secondary | ICD-10-CM | POA: Diagnosis not present

## 2015-11-22 DIAGNOSIS — Z7982 Long term (current) use of aspirin: Secondary | ICD-10-CM | POA: Diagnosis not present

## 2015-11-22 DIAGNOSIS — I739 Peripheral vascular disease, unspecified: Secondary | ICD-10-CM | POA: Diagnosis not present

## 2015-11-22 DIAGNOSIS — E1142 Type 2 diabetes mellitus with diabetic polyneuropathy: Secondary | ICD-10-CM | POA: Diagnosis not present

## 2015-11-22 DIAGNOSIS — E1151 Type 2 diabetes mellitus with diabetic peripheral angiopathy without gangrene: Secondary | ICD-10-CM | POA: Diagnosis not present

## 2015-11-22 DIAGNOSIS — Z4801 Encounter for change or removal of surgical wound dressing: Secondary | ICD-10-CM | POA: Diagnosis not present

## 2015-11-22 DIAGNOSIS — Z4781 Encounter for orthopedic aftercare following surgical amputation: Secondary | ICD-10-CM | POA: Diagnosis not present

## 2015-11-23 ENCOUNTER — Other Ambulatory Visit: Payer: Self-pay | Admitting: Internal Medicine

## 2015-11-24 DIAGNOSIS — E1151 Type 2 diabetes mellitus with diabetic peripheral angiopathy without gangrene: Secondary | ICD-10-CM | POA: Diagnosis not present

## 2015-11-24 DIAGNOSIS — D631 Anemia in chronic kidney disease: Secondary | ICD-10-CM | POA: Diagnosis not present

## 2015-11-24 DIAGNOSIS — Z4781 Encounter for orthopedic aftercare following surgical amputation: Secondary | ICD-10-CM | POA: Diagnosis not present

## 2015-11-24 DIAGNOSIS — Z4801 Encounter for change or removal of surgical wound dressing: Secondary | ICD-10-CM | POA: Diagnosis not present

## 2015-11-24 DIAGNOSIS — I129 Hypertensive chronic kidney disease with stage 1 through stage 4 chronic kidney disease, or unspecified chronic kidney disease: Secondary | ICD-10-CM | POA: Diagnosis not present

## 2015-11-24 DIAGNOSIS — Z89511 Acquired absence of right leg below knee: Secondary | ICD-10-CM | POA: Diagnosis not present

## 2015-11-24 DIAGNOSIS — E1142 Type 2 diabetes mellitus with diabetic polyneuropathy: Secondary | ICD-10-CM | POA: Diagnosis not present

## 2015-11-24 DIAGNOSIS — Z7982 Long term (current) use of aspirin: Secondary | ICD-10-CM | POA: Diagnosis not present

## 2015-11-24 DIAGNOSIS — E1122 Type 2 diabetes mellitus with diabetic chronic kidney disease: Secondary | ICD-10-CM | POA: Diagnosis not present

## 2015-11-24 DIAGNOSIS — Z794 Long term (current) use of insulin: Secondary | ICD-10-CM | POA: Diagnosis not present

## 2015-11-24 DIAGNOSIS — D72819 Decreased white blood cell count, unspecified: Secondary | ICD-10-CM | POA: Diagnosis not present

## 2015-11-24 DIAGNOSIS — N184 Chronic kidney disease, stage 4 (severe): Secondary | ICD-10-CM | POA: Diagnosis not present

## 2015-11-24 DIAGNOSIS — Z940 Kidney transplant status: Secondary | ICD-10-CM | POA: Diagnosis not present

## 2015-11-27 ENCOUNTER — Other Ambulatory Visit (HOSPITAL_COMMUNITY): Payer: Self-pay | Admitting: Family

## 2015-11-28 ENCOUNTER — Encounter (HOSPITAL_COMMUNITY): Payer: Self-pay | Admitting: *Deleted

## 2015-11-28 MED ORDER — CHLORHEXIDINE GLUCONATE 4 % EX LIQD: 60.0000 mL | Freq: Once | CUTANEOUS | Status: DC

## 2015-11-28 MED ORDER — CEFAZOLIN SODIUM-DEXTROSE 2-4 GM/100ML-% IV SOLN
2.0000 g | INTRAVENOUS | Status: AC
  Administered 2015-11-29: 2 g via INTRAVENOUS
  Filled 2015-11-28: qty 100

## 2015-11-28 NOTE — Progress Notes (Signed)
Anesthesia Chart Review: SAME DAY WORK-UP.  Patient is a 45 year old female scheduled for right BKA on 11/29/15 by Dr. Sharol Given.   History includes CKD/ESRD with dialysis dependence (RUE AVF '13) s/p renal transplant 01/24/13 [facility]), anemia, HTN, HLD, DM2 with retinopathy and neuropathy, right third finger amputation, PAD s/p PTA/stenting of right EIA/SFA 10/06/15 and s/p right TMA [id number] (GA/LMA) complicated by pulseless asystolic arrest in the setting of SOB/hypoxia (mid 80s) on POD #1 s/p CPR (no drugs or shocks delivered; patient was not on tele) X 2 minutes with ROSC. Anemia may have played a role, s/p transfusion. Had low probability VQ scan. Echo showed normal LVEF with moderate-severe LVH with dynamic obstruction at rest in the mid cavity. Cardiologist Dr. Einar Gip planned out-patient ischemic work-up. However, at her 11/22/15 follow-up appointment, he wrote that "We will continue observation for now. I'll set her up for a stress test once [she] recuperates from her critical limb ischemia issues. I do not suspect cardiac arrhythmias." Severe orthostasis or vasovagal episode were also on the differential.  PCP is DrMarland Kitchen Iona Beard [name]. Nephrologist is Dr. Elmarie Shiley. Last yearly visit with [facility] Renal transplant team was on 01/16/15.   Meds include albuterol, ASA, Pletal, clonidine patch, Novolog, Levemir, labetalol, Myfortic, Nitro patch, Protonix, pravastatin, Prograf.   10/05/15 EKG: NSR, probable LAE.  10/14/15 Echo: Study Conclusions - Left ventricle: The cavity size was normal. Wall thickness was  increased in a pattern of moderate to severe LVH. Systolic  function was vigorous. The estimated ejection fraction was in the  range of 65% to 70%. There was dynamic obstruction at rest in the  mid cavity, with a peak velocity of 150 cm/sec and a peak  gradient of 9 mm Hg. Wall motion was normal; there were no  regional wall motion abnormalities. - Pericardium, extracardiac: A small to moderate  pericardial  effusion was identified.  10/14/15 1V CXR: FINDINGS: Heart size upper normal and stable. Vascular pattern normal. Lungs clear. IMPRESSION: No active disease.  She is for labs on arrival.   Discussed above with Dr. Sharol Given and anesthesiologist Dr. Therisa Doyne. Recommend to touch base with Dr. Einar Gip for preoperative recommendations. I actually saw Dr. Einar Gip and spoke with him this afternoon about surgery plans. He just saw patient last week and felt she was stable. He felt she could tolerate BKA from a cardiac standpoint and did not recommend additional cardiac testing prior to BKA. He said he would be available for consultation if needed. With her history of CPR on POD#1 in 09/2015, I would anticipate need for post-operative telemetry.   George Hugh [facility] Short Stay Center/Anesthesiology Phone [phone] 11/28/2015 1:55 PM

## 2015-11-28 NOTE — Progress Notes (Signed)
[name] denies chest pain or shortness of breath.  Last office visit with Dr Nadyne Coombes was 11/22/15.  I will request records.  [name] reports fasting CBGs of 130's. I instructed patient to take 1/2 dose of Levemor tonight and in am- 11 Units. If CBG > 220n in am to take 1/2 dose of Novolog.  I instructed patient to check CBG to check CBG and if it is less than 70 to treat it with Glucose Gel, Glucose tablets or 1/2 cup of clear juice like apple juice or cranberry juice, or 1/2 cup of regular soda. (not cream soda). I instructed patient to recheck CBG in 15 minutes and if CBG is not greater than 70, to  Call [phone] (pre- op). If it is before pre-op opens to retreat as before and recheck CBG in 15 minutes. I told patient to make note of time that liquid is taken and amount, that surgical time may have to be adjusted.

## 2015-11-29 ENCOUNTER — Inpatient Hospital Stay (HOSPITAL_COMMUNITY): Payer: Medicare Other | Admitting: Vascular Surgery

## 2015-11-29 ENCOUNTER — Inpatient Hospital Stay (HOSPITAL_COMMUNITY)
Admission: RE | Admit: 2015-11-29 | Discharge: 2015-12-05 | DRG: 240 | Disposition: A | Discharge status: Home Health | Payer: Medicare Other | Source: Ambulatory Visit | Attending: Orthopedic Surgery | Admitting: Orthopedic Surgery

## 2015-11-29 ENCOUNTER — Encounter (HOSPITAL_COMMUNITY)
Admission: RE | Disposition: A | Discharge status: Home Health | Payer: Self-pay | Source: Ambulatory Visit | Attending: Orthopedic Surgery

## 2015-11-29 DIAGNOSIS — E8889 Other specified metabolic disorders: Secondary | ICD-10-CM | POA: Diagnosis present

## 2015-11-29 DIAGNOSIS — E1122 Type 2 diabetes mellitus with diabetic chronic kidney disease: Secondary | ICD-10-CM | POA: Diagnosis present

## 2015-11-29 DIAGNOSIS — E1169 Type 2 diabetes mellitus with other specified complication: Secondary | ICD-10-CM | POA: Diagnosis present

## 2015-11-29 DIAGNOSIS — I5042 Chronic combined systolic (congestive) and diastolic (congestive) heart failure: Secondary | ICD-10-CM | POA: Diagnosis present

## 2015-11-29 DIAGNOSIS — I96 Gangrene, not elsewhere classified: Secondary | ICD-10-CM | POA: Diagnosis not present

## 2015-11-29 DIAGNOSIS — E1152 Type 2 diabetes mellitus with diabetic peripheral angiopathy with gangrene: Principal | ICD-10-CM | POA: Diagnosis present

## 2015-11-29 DIAGNOSIS — I70261 Atherosclerosis of native arteries of extremities with gangrene, right leg: Secondary | ICD-10-CM | POA: Diagnosis present

## 2015-11-29 DIAGNOSIS — K219 Gastro-esophageal reflux disease without esophagitis: Secondary | ICD-10-CM | POA: Diagnosis present

## 2015-11-29 DIAGNOSIS — M869 Osteomyelitis, unspecified: Secondary | ICD-10-CM | POA: Diagnosis present

## 2015-11-29 DIAGNOSIS — E1140 Type 2 diabetes mellitus with diabetic neuropathy, unspecified: Secondary | ICD-10-CM | POA: Diagnosis present

## 2015-11-29 DIAGNOSIS — D649 Anemia, unspecified: Secondary | ICD-10-CM | POA: Diagnosis present

## 2015-11-29 DIAGNOSIS — IMO0002 Reserved for concepts with insufficient information to code with codable children: Secondary | ICD-10-CM

## 2015-11-29 DIAGNOSIS — G8918 Other acute postprocedural pain: Secondary | ICD-10-CM | POA: Diagnosis not present

## 2015-11-29 DIAGNOSIS — E1142 Type 2 diabetes mellitus with diabetic polyneuropathy: Secondary | ICD-10-CM | POA: Diagnosis not present

## 2015-11-29 DIAGNOSIS — L97919 Non-pressure chronic ulcer of unspecified part of right lower leg with unspecified severity: Secondary | ICD-10-CM | POA: Diagnosis not present

## 2015-11-29 DIAGNOSIS — E11319 Type 2 diabetes mellitus with unspecified diabetic retinopathy without macular edema: Secondary | ICD-10-CM | POA: Diagnosis present

## 2015-11-29 HISTORY — PX: AMPUTATION: SHX166

## 2015-11-29 HISTORY — DX: Gastro-esophageal reflux disease without esophagitis: K21.9

## 2015-11-29 LAB — BASIC METABOLIC PANEL
Anion gap: 10 (ref 5–15)
BUN: 7 mg/dL (ref 6–20)
CO2: 20 mmol/L — ABNORMAL LOW (ref 22–32)
Calcium: 9.5 mg/dL (ref 8.9–10.3)
Chloride: 108 mmol/L (ref 101–111)
Creatinine, Ser: 0.91 mg/dL (ref 0.44–1.00)
GFR calc Af Amer: >60 mL/min (ref >60)
GFR calc non Af Amer: >60 mL/min (ref >60)
Glucose, Bld: 161 mg/dL — ABNORMAL HIGH (ref 65–99)
Potassium: 4 mmol/L (ref 3.5–5.1)
Sodium: 138 mmol/L (ref 135–145)

## 2015-11-29 LAB — CBC
HCT: 28.2 % — ABNORMAL LOW (ref 36.0–46.0)
Hemoglobin: 8.7 g/dL — ABNORMAL LOW (ref 12.0–15.0)
MCH: 23.5 pg — ABNORMAL LOW (ref 26.0–34.0)
MCHC: 30.9 g/dL (ref 30.0–36.0)
MCV: 76 fL — ABNORMAL LOW (ref 78.0–100.0)
Platelets: 232 10*3/uL (ref 150–400)
RBC: 3.71 MIL/uL — ABNORMAL LOW (ref 3.87–5.11)
RDW: 18.5 % — ABNORMAL HIGH (ref 11.5–15.5)
WBC: 2.5 10*3/uL — ABNORMAL LOW (ref 4.0–10.5)

## 2015-11-29 LAB — HCG, SERUM, QUALITATIVE: Preg, Serum: NEGATIVE

## 2015-11-29 LAB — GLUCOSE, CAPILLARY
Glucose-Capillary: 174 mg/dL — ABNORMAL HIGH (ref 65–99)
Glucose-Capillary: 186 mg/dL — ABNORMAL HIGH (ref 65–99)
Glucose-Capillary: 120 mg/dL — ABNORMAL HIGH (ref 65–99)

## 2015-11-29 SURGERY — AMPUTATION BELOW KNEE
Anesthesia: General | Site: Leg Lower | Laterality: Right

## 2015-11-29 MED ORDER — LACTATED RINGERS IV SOLN: INTRAVENOUS | Status: DC

## 2015-11-29 MED ORDER — METOCLOPRAMIDE HCL 5 MG PO TABS: 5.0000 mg | ORAL_TABLET | Freq: Three times a day (TID) | ORAL | Status: DC | PRN

## 2015-11-29 MED ORDER — METOCLOPRAMIDE HCL 5 MG/ML IJ SOLN
5.0000 mg | Freq: Three times a day (TID) | INTRAMUSCULAR | Status: DC | PRN
  Administered 2015-12-04 (×2): 10 mg via INTRAVENOUS
  Filled 2015-11-29 (×3): qty 2

## 2015-11-29 MED ORDER — PRAVASTATIN SODIUM 20 MG PO TABS
20.0000 mg | ORAL_TABLET | Freq: Every day | ORAL | Status: DC
  Administered 2015-11-29 – 2015-12-04 (×6): 20 mg via ORAL
  Filled 2015-11-29 (×6): qty 1

## 2015-11-29 MED ORDER — ACETAMINOPHEN 650 MG RE SUPP: 650.0000 mg | Freq: Four times a day (QID) | RECTAL | Status: DC | PRN

## 2015-11-29 MED ORDER — FENTANYL CITRATE (PF) 250 MCG/5ML IJ SOLN
INTRAMUSCULAR | Status: AC
  Filled 2015-11-29: qty 5

## 2015-11-29 MED ORDER — HYDROMORPHONE HCL 1 MG/ML IJ SOLN
1.0000 mg | INTRAMUSCULAR | Status: DC | PRN
  Administered 2015-12-04: 1 mg via INTRAVENOUS
  Filled 2015-11-29: qty 1

## 2015-11-29 MED ORDER — PHENYLEPHRINE HCL 10 MG/ML IJ SOLN
INTRAMUSCULAR | Status: DC | PRN
  Administered 2015-11-29 (×2): 40 ug via INTRAVENOUS

## 2015-11-29 MED ORDER — CEFAZOLIN SODIUM 1-5 GM-% IV SOLN
1.0000 g | Freq: Four times a day (QID) | INTRAVENOUS | Status: AC
  Administered 2015-11-29 – 2015-11-30 (×3): 1 g via INTRAVENOUS
  Filled 2015-11-29 (×3): qty 50

## 2015-11-29 MED ORDER — TACROLIMUS 1 MG PO CAPS
1.5000 mg | ORAL_CAPSULE | Freq: Two times a day (BID) | ORAL | Status: DC
  Administered 2015-11-29 – 2015-12-04 (×11): 1.5 mg via ORAL
  Filled 2015-11-29 (×13): qty 1

## 2015-11-29 MED ORDER — MYCOPHENOLATE SODIUM 180 MG PO TBEC
560.0000 mg | DELAYED_RELEASE_TABLET | Freq: Every day | ORAL | Status: DC
  Administered 2015-11-30 – 2015-12-04 (×5): 540 mg via ORAL
  Filled 2015-11-29 (×6): qty 3

## 2015-11-29 MED ORDER — LABETALOL HCL 300 MG PO TABS
300.0000 mg | ORAL_TABLET | Freq: Two times a day (BID) | ORAL | Status: DC
  Administered 2015-11-29 – 2015-12-04 (×11): 300 mg via ORAL
  Filled 2015-11-29 (×13): qty 1

## 2015-11-29 MED ORDER — INSULIN DETEMIR 100 UNIT/ML ~~LOC~~ SOLN
11.0000 [IU] | Freq: Every day | SUBCUTANEOUS | Status: DC
  Administered 2015-11-29 – 2015-12-04 (×5): 11 [IU] via SUBCUTANEOUS
  Filled 2015-11-29 (×7): qty 0.11

## 2015-11-29 MED ORDER — ONDANSETRON HCL 4 MG/2ML IJ SOLN
INTRAMUSCULAR | Status: AC
  Filled 2015-11-29: qty 2

## 2015-11-29 MED ORDER — INSULIN ASPART 100 UNIT/ML ~~LOC~~ SOLN
3.0000 [IU] | Freq: Three times a day (TID) | SUBCUTANEOUS | Status: DC
  Administered 2015-11-29 – 2015-12-04 (×7): 3 [IU] via SUBCUTANEOUS

## 2015-11-29 MED ORDER — SODIUM CHLORIDE 0.9 % IV SOLN
INTRAVENOUS | Status: DC
  Administered 2015-11-29 (×3): via INTRAVENOUS

## 2015-11-29 MED ORDER — ONDANSETRON HCL 4 MG/2ML IJ SOLN
4.0000 mg | Freq: Four times a day (QID) | INTRAMUSCULAR | Status: DC | PRN
  Administered 2015-12-04: 4 mg via INTRAVENOUS
  Filled 2015-11-29: qty 2

## 2015-11-29 MED ORDER — METHOCARBAMOL 500 MG PO TABS
500.0000 mg | ORAL_TABLET | Freq: Four times a day (QID) | ORAL | Status: DC | PRN
  Administered 2015-11-29 – 2015-12-04 (×6): 500 mg via ORAL
  Filled 2015-11-29 (×5): qty 1

## 2015-11-29 MED ORDER — INSULIN ASPART 100 UNIT/ML ~~LOC~~ SOLN
0.0000 [IU] | Freq: Three times a day (TID) | SUBCUTANEOUS | Status: DC
  Administered 2015-11-29: 2 [IU] via SUBCUTANEOUS
  Administered 2015-11-30 – 2015-12-04 (×4): 1 [IU] via SUBCUTANEOUS
  Administered 2015-12-04: 2 [IU] via SUBCUTANEOUS

## 2015-11-29 MED ORDER — MYCOPHENOLATE SODIUM 180 MG PO TBEC
180.0000 mg | DELAYED_RELEASE_TABLET | Freq: Two times a day (BID) | ORAL | Status: DC
  Filled 2015-11-29: qty 1

## 2015-11-29 MED ORDER — ONDANSETRON HCL 4 MG/2ML IJ SOLN
INTRAMUSCULAR | Status: DC | PRN
  Administered 2015-11-29: 4 mg via INTRAVENOUS

## 2015-11-29 MED ORDER — FENTANYL CITRATE (PF) 100 MCG/2ML IJ SOLN
INTRAMUSCULAR | Status: AC
  Administered 2015-11-29: 25 ug via INTRAVENOUS
  Filled 2015-11-29: qty 2

## 2015-11-29 MED ORDER — PHENYLEPHRINE 40 MCG/ML (10ML) SYRINGE FOR IV PUSH (FOR BLOOD PRESSURE SUPPORT)
PREFILLED_SYRINGE | INTRAVENOUS | Status: AC
  Filled 2015-11-29: qty 10

## 2015-11-29 MED ORDER — AMLODIPINE BESYLATE 10 MG PO TABS
10.0000 mg | ORAL_TABLET | Freq: Every day | ORAL | Status: DC
  Administered 2015-11-29 – 2015-12-04 (×6): 10 mg via ORAL
  Filled 2015-11-29 (×6): qty 1

## 2015-11-29 MED ORDER — 0.9 % SODIUM CHLORIDE (POUR BTL) OPTIME
TOPICAL | Status: DC | PRN
  Administered 2015-11-29: 1000 mL

## 2015-11-29 MED ORDER — PANTOPRAZOLE SODIUM 40 MG PO TBEC
40.0000 mg | DELAYED_RELEASE_TABLET | Freq: Every day | ORAL | Status: DC
  Administered 2015-11-29 – 2015-12-04 (×6): 40 mg via ORAL
  Filled 2015-11-29 (×6): qty 1

## 2015-11-29 MED ORDER — ONDANSETRON HCL 4 MG/2ML IJ SOLN: 4.0000 mg | Freq: Once | INTRAMUSCULAR | Status: DC | PRN

## 2015-11-29 MED ORDER — METHOCARBAMOL 500 MG PO TABS
ORAL_TABLET | ORAL | Status: AC
  Filled 2015-11-29: qty 1

## 2015-11-29 MED ORDER — OXYCODONE HCL 5 MG PO TABS
5.0000 mg | ORAL_TABLET | ORAL | Status: DC | PRN
  Administered 2015-11-29 (×2): 10 mg via ORAL
  Administered 2015-11-30 (×3): 5 mg via ORAL
  Administered 2015-11-30 – 2015-12-02 (×6): 10 mg via ORAL
  Administered 2015-12-02: 5 mg via ORAL
  Administered 2015-12-02: 10 mg via ORAL
  Administered 2015-12-02: 5 mg via ORAL
  Administered 2015-12-03 – 2015-12-05 (×6): 10 mg via ORAL
  Filled 2015-11-29 (×2): qty 2
  Filled 2015-11-29: qty 1
  Filled 2015-11-29 (×6): qty 2
  Filled 2015-11-29: qty 1
  Filled 2015-11-29 (×5): qty 2
  Filled 2015-11-29: qty 1
  Filled 2015-11-29: qty 2
  Filled 2015-11-29 (×3): qty 1
  Filled 2015-11-29: qty 2

## 2015-11-29 MED ORDER — ACETAMINOPHEN 325 MG PO TABS
650.0000 mg | ORAL_TABLET | Freq: Four times a day (QID) | ORAL | Status: DC | PRN
  Administered 2015-11-30 – 2015-12-04 (×2): 650 mg via ORAL
  Filled 2015-11-29 (×2): qty 2

## 2015-11-29 MED ORDER — PROPOFOL 10 MG/ML IV BOLUS
INTRAVENOUS | Status: AC
  Filled 2015-11-29: qty 20

## 2015-11-29 MED ORDER — MYCOPHENOLATE SODIUM 180 MG PO TBEC
360.0000 mg | DELAYED_RELEASE_TABLET | Freq: Every day | ORAL | Status: DC
  Administered 2015-11-29 – 2015-12-04 (×6): 360 mg via ORAL
  Filled 2015-11-29 (×6): qty 2

## 2015-11-29 MED ORDER — OXYCODONE HCL 5 MG PO TABS: 5.0000 mg | ORAL_TABLET | Freq: Once | ORAL | Status: DC | PRN

## 2015-11-29 MED ORDER — OXYCODONE HCL 5 MG/5ML PO SOLN: 5.0000 mg | Freq: Once | ORAL | Status: DC | PRN

## 2015-11-29 MED ORDER — MIDAZOLAM HCL 2 MG/2ML IJ SOLN
INTRAMUSCULAR | Status: AC
  Filled 2015-11-29: qty 2

## 2015-11-29 MED ORDER — PROPOFOL 10 MG/ML IV BOLUS
INTRAVENOUS | Status: DC | PRN
  Administered 2015-11-29: 110 mg via INTRAVENOUS

## 2015-11-29 MED ORDER — FENTANYL CITRATE (PF) 100 MCG/2ML IJ SOLN
INTRAMUSCULAR | Status: DC | PRN
  Administered 2015-11-29: 50 ug via INTRAVENOUS

## 2015-11-29 MED ORDER — OXYCODONE HCL 5 MG PO TABS
ORAL_TABLET | ORAL | Status: AC
  Filled 2015-11-29: qty 2

## 2015-11-29 MED ORDER — SODIUM CHLORIDE 0.9 % IV SOLN: INTRAVENOUS | Status: DC

## 2015-11-29 MED ORDER — FENTANYL CITRATE (PF) 100 MCG/2ML IJ SOLN
25.0000 ug | INTRAMUSCULAR | Status: DC | PRN
  Administered 2015-11-29 (×4): 25 ug via INTRAVENOUS

## 2015-11-29 MED ORDER — ONDANSETRON HCL 4 MG PO TABS: 4.0000 mg | ORAL_TABLET | Freq: Four times a day (QID) | ORAL | Status: DC | PRN

## 2015-11-29 MED ORDER — METHOCARBAMOL 1000 MG/10ML IJ SOLN
500.0000 mg | Freq: Four times a day (QID) | INTRAVENOUS | Status: DC | PRN
  Filled 2015-11-29: qty 5

## 2015-11-29 SURGICAL SUPPLY — 38 items
BLADE SAW RECIP 87.9 MT (BLADE) ×2 IMPLANT
BLADE SURG 21 STRL SS (BLADE) ×2 IMPLANT
BNDG COHESIVE 6X5 TAN STRL LF (GAUZE/BANDAGES/DRESSINGS) ×3 IMPLANT
BNDG GAUZE ELAST 4 BULKY (GAUZE/BANDAGES/DRESSINGS) ×2 IMPLANT
CANISTER SUCTION WELLS/JOHNSON (MISCELLANEOUS) ×1 IMPLANT
CANISTER WOUND CARE 500ML ATS (WOUND CARE) ×1 IMPLANT
COVER SURGICAL LIGHT HANDLE (MISCELLANEOUS) ×3 IMPLANT
CUFF TOURNIQUET SINGLE 34IN LL (TOURNIQUET CUFF) ×1 IMPLANT
CUFF TOURNIQUET SINGLE 44IN (TOURNIQUET CUFF) IMPLANT
DRAPE EXTREMITY T 121X128X90 (DRAPE) ×2 IMPLANT
DRAPE INCISE IOBAN 66X45 STRL (DRAPES) ×1 IMPLANT
DRAPE PROXIMA HALF (DRAPES) ×2 IMPLANT
DRAPE U-SHAPE 47X51 STRL (DRAPES) ×2 IMPLANT
DRSG ADAPTIC 3X8 NADH LF (GAUZE/BANDAGES/DRESSINGS) ×1 IMPLANT
DRSG PAD ABDOMINAL 8X10 ST (GAUZE/BANDAGES/DRESSINGS) ×3 IMPLANT
DURAPREP 26ML APPLICATOR (WOUND CARE) ×2 IMPLANT
ELECT REM PT RETURN 9FT ADLT (ELECTROSURGICAL) ×2
ELECTRODE REM PT RTRN 9FT ADLT (ELECTROSURGICAL) ×1 IMPLANT
GAUZE SPONGE 4X4 12PLY STRL (GAUZE/BANDAGES/DRESSINGS) ×1 IMPLANT
GLOVE BIOGEL PI IND STRL 9 (GLOVE) ×1 IMPLANT
GLOVE BIOGEL PI INDICATOR 9 (GLOVE) ×1
GLOVE SURG ORTHO 9.0 STRL STRW (GLOVE) ×2 IMPLANT
GOWN STRL REUS W/ TWL XL LVL3 (GOWN DISPOSABLE) ×2 IMPLANT
GOWN STRL REUS W/TWL XL LVL3 (GOWN DISPOSABLE) ×8
KIT BASIN OR (CUSTOM PROCEDURE TRAY) ×2 IMPLANT
KIT ROOM TURNOVER OR (KITS) ×2 IMPLANT
MANIFOLD NEPTUNE II (INSTRUMENTS) ×1 IMPLANT
NS IRRIG 1000ML POUR BTL (IV SOLUTION) ×2 IMPLANT
PACK GENERAL/GYN (CUSTOM PROCEDURE TRAY) ×2 IMPLANT
PAD ARMBOARD 7.5X6 YLW CONV (MISCELLANEOUS) ×2 IMPLANT
PREVENA INCISION MGT 90 150 (MISCELLANEOUS) ×1 IMPLANT
SPONGE LAP 18X18 X RAY DECT (DISPOSABLE) ×1 IMPLANT
STAPLER VISISTAT 35W (STAPLE) ×1 IMPLANT
STOCKINETTE IMPERVIOUS LG (DRAPES) ×2 IMPLANT
SUT SILK 2 0 (SUTURE) ×2
SUT SILK 2-0 18XBRD TIE 12 (SUTURE) ×1 IMPLANT
SUT VIC AB 1 CTX 27 (SUTURE) ×2 IMPLANT
TOWEL OR 17X26 10 PK STRL BLUE (TOWEL DISPOSABLE) ×2 IMPLANT

## 2015-11-29 NOTE — Anesthesia Preprocedure Evaluation (Signed)
Anesthesia Evaluation  Patient identified by MRN, date of birth, ID band Patient awake    Reviewed: Allergy & Precautions, NPO status , Patient's Chart, lab work & pertinent test results  Airway Mallampati: II  TM Distance: >3 FB Neck ROM: Full    Dental  (+) Teeth Intact, Dental Advisory Given   Pulmonary    breath sounds clear to auscultation       Cardiovascular hypertension,  Rhythm:Regular Rate:Normal     Neuro/Psych    GI/Hepatic   Endo/Other  diabetes  Renal/GU      Musculoskeletal   Abdominal   Peds  Hematology   Anesthesia Other Findings   Reproductive/Obstetrics                             Anesthesia Physical Anesthesia Plan  ASA: III  Anesthesia Plan: General   Post-op Pain Management:    Induction: Intravenous  Airway Management Planned: LMA  Additional Equipment:   Intra-op Plan:   Post-operative Plan: Extubation in OR  Informed Consent: I have reviewed the patients History and Physical, chart, labs and discussed the procedure including the risks, benefits and alternatives for the proposed anesthesia with the patient or authorized representative who has indicated his/her understanding and acceptance.   Dental advisory given  Plan Discussed with: CRNA and Anesthesiologist  Anesthesia Plan Comments:         Anesthesia Quick Evaluation

## 2015-11-29 NOTE — Progress Notes (Signed)
Kendell Bane, RN providing lunch coverage for [employer], [place]

## 2015-11-29 NOTE — Anesthesia Postprocedure Evaluation (Signed)
Anesthesia Post Note  Patient: [name]  Procedure(s) Performed: Procedure(s) (LRB): RIGHT BELOW KNEE AMPUTATION (Right)  Patient location during evaluation: PACU Anesthesia Type: General Level of consciousness: awake, awake and alert and oriented Pain management: pain level controlled Vital Signs Assessment: post-procedure vital signs reviewed and stable Respiratory status: spontaneous breathing, nonlabored ventilation and respiratory function stable Cardiovascular status: blood pressure returned to baseline Anesthetic complications: no    Last Vitals:  Filed Vitals:   11/29/15 1415 11/29/15 1428  BP: 124/71 126/65  Pulse: 86 89  Temp:    Resp: 13 17    Last Pain:  Filed Vitals:   11/29/15 1428  PainSc: 9                  Mariajose Mow COKER

## 2015-11-29 NOTE — H&P (Signed)
[name] is an 45 y.o. female.   Chief Complaint: Ulceration osteomyelitis right Chopart amputation HPI: Patient is a 45 year old woman with end-stage renal disease type 2 diabetes severe peripheral vascular disease and congestive heart failure who has failed limb salvage with attempted Chopart amputation. Patient presents at this time for transtibial amputation with black gangrenous hindfoot.  Past Medical History  Diagnosis Date  . Retinopathy   . Metabolic bone disease   . Anemia   . CHF (congestive heart failure) ([place])   . History of blood transfusion     related to "kidney transplant"  . Hypertension     sees Dr. Carolin Guernsey  . High cholesterol   . ESRD (end stage renal disease) on dialysis ([place]) 09/28/2011-01/23/2013    M-W-F; [place]  . Pneumonia 09/27/2011  . Type II diabetes mellitus ([place])     "controlled with diet"  . Critical ischemia of lower extremity hospitalized 10/05/2015    right  . Peripheral vascular disease ([place])   . Diabetic neuropathy ([place])   . GERD (gastroesophageal reflux disease)     Past Surgical History  Procedure Laterality Date  . Insertion of dialysis catheter  10/04/2011    Procedure: INSERTION OF DIALYSIS CATHETER;  Surgeon: Angelia Mould, MD;  Location: [place];  Service: Vascular;  Laterality: Right;  insertion of dialysis catheter right internal jugular  . Av fistula placement  10/04/2011    Procedure: INSERTION OF ARTERIOVENOUS (AV) GORE-TEX GRAFT ARM;  Surgeon: Angelia Mould, MD;  Location: [place];  Service: Vascular;  Laterality: Left;  Insertion left upper arm Arteriovenous goretex graft  . Avgg removal  10/04/2011    Procedure: REMOVAL OF ARTERIOVENOUS GORETEX GRAFT ([place]);  Surgeon: Elam Dutch, MD;  Location: [place];  Service: Vascular;  Laterality: Left;  . Av fistula placement  10/29/2011    Procedure: ARTERIOVENOUS (AV) FISTULA CREATION;  Surgeon: Angelia Mould, MD;  Location: [facility] OR;  Service: Vascular;   Laterality: Right;  Creation Right Arteriovenous Fistula   . Revison of arteriovenous fistula  06/23/2012    Procedure: REVISON OF ARTERIOVENOUS FISTULA;  Surgeon: Angelia Mould, MD;  Location: [place];  Service: Vascular;  Laterality: Right;  Ultrasound guided  . Insertion of dialysis catheter  06/23/2012    Procedure: INSERTION OF DIALYSIS CATHETER;  Surgeon: Angelia Mould, MD;  Location: [place];  Service: Vascular;  Laterality: N/A;  Ultrasound guided  . Patch angioplasty  06/23/2012    Procedure: PATCH ANGIOPLASTY;  Surgeon: Angelia Mould, MD;  [facility] OR;  Service: Vascular;  Laterality: Right;  . Esophagogastroduodenoscopy N/A 12/24/2012    Procedure: ESOPHAGOGASTRODUODENOSCOPY (EGD);  Surgeon: Milus Banister, MD;  Location: [place];  Service: Endoscopy;  Laterality: N/A;  . Kidney transplant  01/24/2013  . Finger amputation Right 02/26/2013    "3rd finger"  . Shuntogram N/A 04/06/2012    Procedure: Earney Mallet;  Surgeon: Angelia Mould, MD;  Location: [facility] CATH LAB;  Service: Cardiovascular;  Laterality: N/A;  . Peripheral vascular catheterization N/A 09/19/2015    Procedure: Lower Extremity Angiography;  Surgeon: Adrian Prows, MD;  Location: [place] CV LAB;  Service: Cardiovascular;  Laterality: N/A;  . Peripheral vascular catheterization N/A 09/19/2015    Procedure: Abdominal Aortogram;  Surgeon: Adrian Prows, MD;  Location: [place] CV LAB;  Service: Cardiovascular;  Laterality: N/A;  . Peripheral vascular catheterization Right 09/19/2015    Procedure: Peripheral Vascular Intervention;  Surgeon: Adrian Prows, MD;  Location: [facility]  INVASIVE CV LAB;  Service: Cardiovascular;  Laterality: Right;  Right Common  Iliac  . Peripheral vascular catheterization N/A 10/06/2015    Procedure: Lower Extremity Angiography;  Surgeon: Adrian Prows, MD;  Location: [place] CV LAB;  Service: Cardiovascular;  Laterality: N/A;  . Peripheral vascular catheterization  10/06/2015    Procedure:  Peripheral Vascular Intervention;  Surgeon: Adrian Prows, MD;  Location: [place] CV LAB;  Service: Cardiovascular;;  REIA  Omnilink 6.0x29, innova 7x80  RSFA innova 5x80  . Amputation Right 10/13/2015    Procedure: Right Transmetatarsal Amputation;  Surgeon: Newt Minion, MD;  Location: [place];  Service: Orthopedics;  Laterality: Right;    Family History  Problem Relation Age of Onset  . Malignant hyperthermia Mother   . Malignant hyperthermia Father   . Anesthesia problems Neg Hx   . Thyroid disease Mother   . Diabetes Brother   . Heart disease Brother     CHF  . Hypertension Mother   . Hypertension Father    Social History:  reports that she has never smoked. She has never used smokeless tobacco. She reports that she does not drink alcohol or use illicit drugs.  Allergies: No Known Allergies  No prescriptions prior to admission    No results found for this or any previous visit (from the past 48 hour(s)). No results found.  Review of Systems  All other systems reviewed and are negative.   There were no vitals taken for this visit. Physical Exam  On examination patient is alert oriented nonoperatively well-dressed normal affect normal respiratory effort she is not able to a. Examination of the right foot she has black gangrenous changes of the entire hindfoot the ulcer probes to bone. Assessment/Plan Assessment: Failed limb salvage intervention with black gangrenous hindfoot.  Plan: We'll plan for right transtibial amputation risk and benefits were discussed including increased risk of the wound not healing potential for higher level amputation. Patient states she understands wish to proceed at this time plan for dialysis postoperatively and plan for skilled nursing placement.  Newt Minion, MD 11/29/2015, 7:17 AM

## 2015-11-29 NOTE — Anesthesia Procedure Notes (Addendum)
Procedure Name: LMA Insertion Date/Time: 11/29/2015 12:01 PM Performed by: Talbot Grumbling Pre-anesthesia Checklist: Patient identified, Emergency Drugs available, Suction available and Patient being monitored Patient Re-evaluated:Patient Re-evaluated prior to inductionOxygen Delivery Method: Circle system utilized Preoxygenation: Pre-oxygenation with 100% oxygen Intubation Type: IV induction Ventilation: Mask ventilation without difficulty LMA: LMA inserted LMA Size: 4.0 Placement Confirmation: positive ETCO2 and breath sounds checked- equal and bilateral Tube secured with: Tape Dental Injury: Teeth and Oropharynx as per pre-operative assessment     Anesthesia Regional Block:  Popliteal block  Pre-Anesthetic Checklist: ,, timeout performed, Correct Patient, Correct Site, Correct Laterality, Correct Procedure, Correct Position, site marked, Risks and benefits discussed,  Surgical consent,  Pre-op evaluation,  At surgeon's request and post-op pain management  Laterality: Right  Prep: chloraprep       Needles:  Injection technique: Single-shot  Needle Type: Echogenic Stimulator Needle     Needle Length: 9cm 9 cm Needle Gauge: 22 and 22 G    Additional Needles: Popliteal block Narrative:  Start time: 11/29/2015 2:05 PM End time: 11/29/2015 2:10 PM Injection made incrementally with aspirations every 5 mL.  Performed by: Personally   Additional Notes: 15 cc 0.5% bupivacaine injected easily   Anesthesia Regional Block:  Adductor canal block  Pre-Anesthetic Checklist: ,, timeout performed, Correct Patient, Correct Site, Correct Laterality, Correct Procedure, Correct Position, site marked, Risks and benefits discussed,  Surgical consent,  Pre-op evaluation,  At surgeon's request and post-op pain management  Laterality: Right  Prep: chloraprep       Needles:  Injection technique: Single-shot  Needle Type: Echogenic Stimulator Needle     Needle Length: 9cm 9  cm Needle Gauge: 22 and 22 G    Additional Needles: Adductor canal block Narrative:  Start time: 11/29/2015 2:10 PM End time: 11/29/2015 2:15 PM Injection made incrementally with aspirations every 5 mL.  Performed by: Personally   Additional Notes: 15 cc 0.5% Bupivacaine injected easily

## 2015-11-29 NOTE — Progress Notes (Signed)
MDA Joslin notified regarding patients continued pain of 9/10 throbbing to right BKA despite 170mcg fentanyl. Will come to bedside and perform a nerve block to help with pain control. Per Dr Linna Caprice do not give any additional fentanyl at this time.

## 2015-11-29 NOTE — Transfer of Care (Signed)
Immediate Anesthesia Transfer of Care Note  Patient: [name]  Procedure(s) Performed: Procedure(s): RIGHT BELOW KNEE AMPUTATION (Right)  Patient Location: PACU  Anesthesia Type:General  Level of Consciousness: awake, alert , oriented and patient cooperative  Airway & Oxygen Therapy: Patient Spontanous Breathing and Patient connected to nasal cannula oxygen  Post-op Assessment: Report given to RN and Post -op Vital signs reviewed and stable  Post vital signs: Reviewed and stable  Last Vitals:  Filed Vitals:   11/29/15 1013  BP: 134/64  Pulse: 88  Temp: 36.7 C  Resp: 20    Last Pain: There were no vitals filed for this visit.       Complications: No apparent anesthesia complications

## 2015-11-29 NOTE — Op Note (Signed)
   Date of Surgery: 11/29/2015  INDICATIONS: [name] is a 45 y.o.-year-old female who presents with gangrenous changes to her Chopart amputation on the right. Patient has undergone vascular limb salvage intervention for critical limb ischemia that she has failed with progressive gangrenous changes of the hindfoot and presents at this time for transtibial amputation.Marland Kitchen  PREOPERATIVE DIAGNOSIS: Gangrene right hindfoot  POSTOPERATIVE DIAGNOSIS: Same.  PROCEDURE: Transtibial amputation Application of Prevena wound VAC  SURGEON: Sharol Given, M.D.  ANESTHESIA:  general  IV FLUIDS AND URINE: See anesthesia.  ESTIMATED BLOOD LOSS: Minimal mL.  COMPLICATIONS: None.  DESCRIPTION OF PROCEDURE: The patient was brought to the operating room and underwent a general anesthetic. After adequate levels of anesthesia were obtained patient's lower extremity was prepped using DuraPrep draped into a sterile field. A timeout was called. The foot was draped out of the sterile field with impervious stockinette. A transverse incision was made 11 cm distal to the tibial tubercle. This curved proximally and a large posterior flap was created. The tibia was transected 1 cm proximal to the skin incision. The fibula was transected just proximal to the tibial incision. The tibia was beveled anteriorly. A large posterior flap was created. The sciatic nerve was pulled cut and allowed to retract. The vascular bundles were suture ligated with 2-0 silk. The deep and superficial fascial layers were closed using #1 Vicryl. The skin was closed using staples and 2-0 nylon. The wound was covered with a Prevena wound VAC. There was a good suction fit. Patient was extubated taken to the PACU in stable condition.  Meridee Score, MD [place] 12:45 PM

## 2015-11-30 ENCOUNTER — Encounter (HOSPITAL_COMMUNITY): Payer: Self-pay | Admitting: Orthopedic Surgery

## 2015-11-30 LAB — GLUCOSE, CAPILLARY
Glucose-Capillary: 81 mg/dL (ref 65–99)
Glucose-Capillary: 139 mg/dL — ABNORMAL HIGH (ref 65–99)
Glucose-Capillary: 88 mg/dL (ref 65–99)
Glucose-Capillary: 148 mg/dL — ABNORMAL HIGH (ref 65–99)

## 2015-11-30 MED ORDER — MYCOPHENOLATE SODIUM 180 MG PO TBEC: 360.0000 mg | DELAYED_RELEASE_TABLET | Freq: Two times a day (BID) | ORAL | Status: DC

## 2015-11-30 MED ORDER — PRAVASTATIN SODIUM 20 MG PO TABS: 20.0000 mg | ORAL_TABLET | Freq: Every day | ORAL | Status: DC

## 2015-11-30 MED ORDER — ASPIRIN EC 81 MG PO TBEC
81.0000 mg | DELAYED_RELEASE_TABLET | Freq: Every day | ORAL | Status: DC
  Administered 2015-11-30 – 2015-12-04 (×5): 81 mg via ORAL
  Filled 2015-11-30 (×5): qty 1

## 2015-11-30 MED ORDER — NITROGLYCERIN 0.2 MG/HR TD PT24
0.2000 mg | MEDICATED_PATCH | Freq: Every day | TRANSDERMAL | Status: DC
  Administered 2015-12-02 – 2015-12-04 (×3): 0.2 mg via TRANSDERMAL
  Filled 2015-11-30 (×6): qty 1

## 2015-11-30 MED ORDER — PANTOPRAZOLE SODIUM 40 MG PO TBEC: 40.0000 mg | DELAYED_RELEASE_TABLET | Freq: Every day | ORAL | Status: DC

## 2015-11-30 MED ORDER — TACROLIMUS 1 MG PO CAPS: 1.0000 mg | ORAL_CAPSULE | Freq: Two times a day (BID) | ORAL | Status: DC

## 2015-11-30 MED ORDER — ALBUTEROL SULFATE (2.5 MG/3ML) 0.083% IN NEBU: 3.0000 mL | INHALATION_SOLUTION | Freq: Four times a day (QID) | RESPIRATORY_TRACT | Status: DC | PRN

## 2015-11-30 MED ORDER — TACROLIMUS 0.5 MG PO CAPS: 0.5000 mg | ORAL_CAPSULE | Freq: Two times a day (BID) | ORAL | Status: DC

## 2015-11-30 MED ORDER — CILOSTAZOL 50 MG PO TABS
100.0000 mg | ORAL_TABLET | Freq: Two times a day (BID) | ORAL | Status: DC
  Administered 2015-11-30 – 2015-12-04 (×10): 100 mg via ORAL
  Filled 2015-11-30 (×10): qty 2

## 2015-11-30 MED ORDER — LABETALOL HCL 300 MG PO TABS: 300.0000 mg | ORAL_TABLET | Freq: Two times a day (BID) | ORAL | Status: DC

## 2015-11-30 MED ORDER — AMLODIPINE BESYLATE 10 MG PO TABS: 10.0000 mg | ORAL_TABLET | Freq: Every day | ORAL | Status: DC

## 2015-11-30 MED ORDER — CLONIDINE HCL 0.2 MG/24HR TD PTWK
0.2000 mg | MEDICATED_PATCH | TRANSDERMAL | Status: DC
  Administered 2015-12-01: 0.2 mg via TRANSDERMAL
  Filled 2015-11-30: qty 1

## 2015-11-30 NOTE — Progress Notes (Signed)
Patient requesting her "normal" nighttime medication. RN paged Dr. Sharol Given regarding restarting medications. Orders received. Dr. Sharol Given to review full medication list in am.

## 2015-11-30 NOTE — Evaluation (Signed)
Occupational Therapy Evaluation Patient Details Name: [name] MRN: [id number] DOB: 19-Nov-1970 Today's Date: 11/30/2015    History of Present Illness Pt is a 45 y/o F s/p Rt transtibial amputation.  On 4/29 pt had episode of SOB and hypoxia to mid 80s and then became unresponsive and pulseless.  She received chest compressions and regained a pulse.  Pt's PMH includes ESRD s/p renal transplant, retinopathy, anemia, CHF, diabetic neuropathy, Rt 3rd finger amputation.   Clinical Impression   Pt with decline in function and safety with ADLs and ADL mobility with decreased balance and activity tolerance. Pt would benefit from acute OT services to address impairments to increase level of function and safety    Follow Up Recommendations  Home health OT;Supervision/Assistance - 24 hour    Equipment Recommendations  None recommended by OT (reacher)    Recommendations for Other Services       Precautions / Restrictions Precautions Precautions: Other (comment) (R LE amputation) Precaution Comments: wound vac, NWB Restrictions Weight Bearing Restrictions: Yes RLE Weight Bearing: Non weight bearing      Mobility Bed Mobility Overal bed mobility: Needs Assistance Bed Mobility: Supine to Sit;Sit to Supine     Supine to sit: Supervision;HOB elevated Sit to supine: Supervision   General bed mobility comments: used rails to sit EOB and was able to scoot to [facility] to return to supine  Transfers Overall transfer level: Needs assistance Equipment used: 2 person hand held assist (from EOB) Transfers: Sit to/from [facility] Sit to Stand: Min assist         General transfer comment: sit - stand from EOB, max A for balance/support x 30 seconds    Balance Overall balance assessment: Needs assistance Sitting-balance support: No upper extremity supported;Feet supported Sitting balance-Leahy Scale: Poor Sitting balance - Comments: posterior leaning with dynamic  acitivty     Standing balance-Leahy Scale: Zero                              ADL Overall ADL's : Needs assistance/impaired     Grooming: Wash/dry hands;Wash/dry face;Sitting;Set up;Min guard   Upper Body Bathing: Minimal assitance;Sitting Upper Body Bathing Details (indicate cue type and reason): min A for balance Lower Body Bathing: Moderate assistance   Upper Body Dressing : Minimal assistance;Sitting Upper Body Dressing Details (indicate cue type and reason): min A for balance Lower Body Dressing: Maximal assistance   Toilet Transfer: Stand-pivot;Moderate assistance   Toileting- Clothing Manipulation and Hygiene: Total assistance       Functional mobility during ADLs: Minimal assistance;Moderate assistance       Vision  wears glasses at all times, no change from baseline              Pertinent Vitals/Pain Pain Assessment: 0-10 Pain Score: 2  Pain Location: R LE surgical area Pain Descriptors / Indicators: Throbbing     Hand Dominance Left   Extremity/Trunk Assessment Upper Extremity Assessment Upper Extremity Assessment: Overall WFL for tasks assessed   Lower Extremity Assessment Lower Extremity Assessment: Defer to PT evaluation       Communication Communication Communication: No difficulties   Cognition Arousal/Alertness: Awake/alert Behavior During Therapy: WFL for tasks assessed/performed Overall Cognitive Status: Within Functional Limits for tasks assessed                     General Comments   pt very pleasant and cooperative  Home Living Family/patient expects to be discharged to:: Private residence Living Arrangements: Parent Available Help at Discharge: Family;Available PRN/intermittently Type of Home: House Home Access: Stairs to enter   Entrance Stairs-Rails: Left Home Layout: Two level;Able to live on main level with bedroom/bathroom     Bathroom Shower/Tub: Tub/shower unit          Home Equipment: Walker - 4 wheels;Wheelchair - [employer];Tub bench          Prior Functioning/Environment Level of Independence: Independent with assistive device(s)  Gait / Transfers Assistance Needed: used RW in house and w/c out in the community ADL's / [place] Needed: Independent with selfcare        OT Diagnosis: Generalized weakness;Acute pain   OT Problem List: Pain;Impaired balance (sitting and/or standing);Decreased activity tolerance;Decreased knowledge of use of DME or AE   OT Treatment/Interventions: Self-care/ADL training;Patient/family education;Therapeutic activities;DME and/or AE instruction    OT Goals(Current goals can be found in the care plan section) Acute Rehab OT Goals Patient Stated Goal: to go home OT Goal Formulation: With patient Time For Goal Achievement: 12/07/15 Potential to Achieve Goals: Good ADL Goals Pt Will Perform Grooming: with set-up;with supervision;sitting Pt Will Perform Upper Body Bathing: with set-up;with min guard assist;with supervision;sitting Pt Will Perform Lower Body Bathing: with mod assist;sitting/lateral leans Pt Will Perform Upper Body Dressing: with min guard assist;with supervision;with set-up;sitting Pt Will Perform Lower Body Dressing: with mod assist;with adaptive equipment;sitting/lateral leans Pt Will Transfer to Toilet: with min assist;bedside commode Pt Will Perform Toileting - Clothing Manipulation and hygiene: with mod assist;sitting/lateral leans  OT Frequency: Min 2X/week   Barriers to D/C:    none                     End of Session Equipment Utilized During Treatment: Gait belt;Other (comment) (BSC)  Activity Tolerance: Patient tolerated treatment well Patient left: in bed;with call bell/phone within reach   Time: 1127-1156 OT Time Calculation (min): 29 min Charges:  OT General Charges $OT Visit: 1 Procedure OT Evaluation $OT Eval Moderate Complexity: 1  Procedure OT Treatments $Therapeutic Activity: 8-22 mins G-Codes:    Britt Bottom 11/30/2015, 1:24 PM

## 2015-11-30 NOTE — Care Management Important Message (Signed)
Important Message  Patient Details  Name: [name] MRN: [id number] Date of Birth: 10/11/1970   Medicare Important Message Given:  Yes    Loann Quill 11/30/2015, 9:59 AM

## 2015-11-30 NOTE — Evaluation (Signed)
Physical Therapy Evaluation Patient Details Name: LASHARN BRESLIN MRN: [id number] DOB: [birth year] Today's Date: 11/30/2015   History of Present Illness  45 y.o.-year-old female who was admitted  with gangrenous changes to Rt LE. Pt previously underwent Chopart amputation as well as previous vascular limb salvage intervention for critical limb ischemia. Now s/p transtibial amputation. PMH: CHF, hypertension, dieabetes, ESRD, PVD, neuropathy.   Clinical Impression  Pt seen for initial evaluation and treatment. Pt mobilizing well with PT and indicates that she has good support at home. Pt able to ambulate 5 ft with rw and min guard assist. Based upon the patient's current mobility level, anticipate D/C to home with family support. PT to continue to follow.     Follow Up Recommendations Home health PT;Supervision for mobility/OOB    Equipment Recommendations  None recommended by PT    Recommendations for Other Services       Precautions / Restrictions Precautions Precautions: Fall Precaution Comments: wound vac Restrictions Weight Bearing Restrictions: Yes RLE Weight Bearing: Non weight bearing      Mobility  Bed Mobility Overal bed mobility: Needs Assistance Bed Mobility: Supine to Sit     Supine to sit: Supervision;HOB elevated Sit to supine: Supervision   General bed mobility comments: using rail to assist to EOB  Transfers Overall transfer level: Needs assistance Equipment used: Rolling walker (2 wheeled) Transfers: Sit to/from Stand Sit to Stand: Min guard         General transfer comment: min guard for safety  Ambulation/Gait Ambulation/Gait assistance: Min guard Ambulation Distance (Feet): 5 Feet Assistive device: Rolling walker (2 wheeled) Gait Pattern/deviations:  (swing-to pattern)     General Gait Details: stable mobility with use of rw  Stairs            Wheelchair Mobility    Modified Rankin (Stroke Patients Only)       Balance  Overall balance assessment: Needs assistance Sitting-balance support: No upper extremity supported Sitting balance-Leahy Scale: Good Sitting balance - Comments: posterior leaning with dynamic acitivty   Standing balance support: Bilateral upper extremity supported Standing balance-Leahy Scale: Poor Standing balance comment: using rw for support                             Pertinent Vitals/Pain Pain Assessment: 0-10 Pain Score: 3  Pain Location: Rt LE  Pain Descriptors / Indicators: Throbbing Pain Intervention(s): Limited activity within patient's tolerance;Monitored during session    [place] expects to be discharged to:: Private residence Living Arrangements: Parent Available Help at Discharge: Family;Available 24 hours/day Type of Home: House Home Access: Ramped entrance Entrance Stairs-Rails: Left   Home Layout: Two level;Able to live on main level with bedroom/bathroom Home Equipment: Walker - 4 wheels;Wheelchair - [employer];Tub bench      Prior Function Level of Independence: Independent with assistive device(s)   Gait / Transfers Assistance Needed: using rw in home and w/c for community distances          Hand Dominance       Extremity/Trunk Assessment   Upper Extremity Assessment: Defer to OT evaluation           Lower Extremity Assessment: RLE deficits/detail RLE Deficits / Details: able to raise without assistance and move independently with bed mobility.        Communication   Communication: No difficulties  Cognition Arousal/Alertness: Awake/alert Behavior During Therapy: WFL for tasks assessed/performed Overall Cognitive Status: Within Functional Limits for  tasks assessed                      General Comments      Exercises        Assessment/Plan    PT Assessment Patient needs continued PT services  PT Diagnosis Difficulty walking   PT Problem List Decreased strength;Decreased  range of motion;Decreased activity tolerance;Decreased balance;Decreased mobility  PT Treatment Interventions DME instruction;Gait training;Functional mobility training;Therapeutic activities;Therapeutic exercise;Balance training;Patient/family education   PT Goals (Current goals can be found in the Care Plan section) Acute Rehab PT Goals Patient Stated Goal: to go home PT Goal Formulation: With patient Time For Goal Achievement: 12/14/15 Potential to Achieve Goals: Good    Frequency Min 3X/week   Barriers to discharge        Co-evaluation               End of Session Equipment Utilized During Treatment: Gait belt Activity Tolerance: Patient tolerated treatment well Patient left: in chair;with call bell/phone within reach (Rt LE elevated) Nurse Communication: Mobility status         Time: [id number] PT Time Calculation (min) (ACUTE ONLY): 20 min   Charges:   PT Evaluation $PT Eval Moderate Complexity: 1 Procedure     PT G Codes:        Cassell Clement, PT, CSCS Pager [phone] Office [phone]  11/30/2015, 2:20 PM

## 2015-11-30 NOTE — Progress Notes (Signed)
Patient ID: DODI WRAGGE, female   DOB: 09/05/1970, 45 y.o.   MRN: [id number] Postoperative day 1 right transtibial amputation. Patient states with elevation the pain resolves. There is no drainage and the wound VAC is functioning well she will be seen by physical therapy if patient progresses well plan for discharge to home with home health physical therapy if she progresses slowly would plan for a short-term in rehabilitation.

## 2015-12-01 LAB — GLUCOSE, CAPILLARY
Glucose-Capillary: 68 mg/dL (ref 65–99)
Glucose-Capillary: 107 mg/dL — ABNORMAL HIGH (ref 65–99)
Glucose-Capillary: 98 mg/dL (ref 65–99)
Glucose-Capillary: 122 mg/dL — ABNORMAL HIGH (ref 65–99)

## 2015-12-01 MED ORDER — OXYCODONE-ACETAMINOPHEN 5-325 MG PO TABS: 1.0000 | ORAL_TABLET | ORAL | Status: DC | PRN

## 2015-12-01 NOTE — Progress Notes (Signed)
PM&R consult requested. Pt is at supervision to mod I level with therapy who recommends [facility] therapy. Will defer consult.  Meredith Staggers, MD, [facility] 12/01/2015 ch

## 2015-12-01 NOTE — Care Management Note (Signed)
Case Management Note  Patient Details  Name: [name] MRN: [id number] Date of Birth: 1971/03/30  Subjective/Objective:   45 yr old female s/p right BKA, with prevena wound vac application, secondary to gangrene of right foot.               Action/Plan: Case manager spoke with patient concerning home health and DME needs. Patient has all necessary equipment at home. Has used [facility] and will do so now. Referral called to Roby Lofts, [facility] Liaison. Patient lives with her parents, will have family support at discharge.   Expected Discharge Date:   12/04/15               Expected Discharge Plan:  [place]  In-House Referral:     Discharge planning Services  CM Consult  Post Acute Care Choice:  Home Health Choice offered to:  Patient  DME Arranged:  N/A DME Agency:  NA  HH Arranged:  PT, OT HH Agency:  [facility]  Status of Service:  Completed, signed off  Medicare Important Message Given:  Yes Date Medicare IM Given:    Medicare IM give by:    Date Additional Medicare IM Given:    Additional Medicare Important Message give by:     If discussed at [place] of Stay Meetings, dates discussed:    Additional Comments:  Ninfa Meeker, RN 12/01/2015, 12:26 PM

## 2015-12-01 NOTE — Progress Notes (Signed)
Call placed to [facility] from [facility] at [phone] per Dr. Jess Barters request regarding functionality of Prevena wound vac. Rep states that she will start her rounds here at [facility] today and will see pt.

## 2015-12-01 NOTE — Care Management Important Message (Signed)
Important Message  Patient Details  Name: [name] MRN: [id number] Date of Birth: 1971/01/05   Medicare Important Message Given:  Yes    Loann Quill 12/01/2015, 9:30 AM

## 2015-12-01 NOTE — Progress Notes (Signed)
Occupational Therapy Treatment Patient Details Name: [name] MRN: [id number] DOB: 1970-12-18 Today's Date: 12/01/2015    History of present illness 45 y.o.-year-old female who was admitted  with gangrenous changes to Rt LE. Pt previously underwent Chopart amputation as well as previous vascular limb salvage intervention for critical limb ischemia. Now s/p transtibial amputation. PMH: CHF, hypertension, dieabetes, ESRD, PVD, neuropathy.    OT comments  Pt making good progress with functional goals. Pt able to SPT to [facility] using RW. Pt able to simulate LB ADLs while seated leaning side to side. Pt scheduled to d/c home today  Follow Up Recommendations  Home health OT;Supervision/Assistance - 24 hour    Equipment Recommendations  None recommended by OT    Recommendations for Other Services      Precautions / Restrictions Precautions Precautions: Fall Precaution Comments: wound vac Restrictions Weight Bearing Restrictions: Yes RLE Weight Bearing: Non weight bearing       Mobility Bed Mobility               General bed mobility comments: pt up in recliner  Transfers Overall transfer level: Modified independent Equipment used: Rolling walker (2 wheeled) Transfers: Sit to/from Stand Sit to Stand: Modified independent (Device/Increase time) Stand pivot transfers: Supervision;Min guard            Balance   Sitting-balance support: No upper extremity supported;Feet supported       Standing balance support: Bilateral upper extremity supported;During functional activity Standing balance-Leahy Scale: Poor                     ADL       Grooming: Wash/dry hands;Wash/dry face;Sitting;Set up;Oral care   Upper Body Bathing: Sitting;Set up;Supervision/ safety;Min guard   Lower Body Bathing: Sitting/lateral leans;Supervison/ safety;Set up;Min guard   Upper Body Dressing : Set up;Min guard;Supervision/safety;Sitting   Lower Body Dressing:  Sitting/lateral leans;Minimal assistance   Toilet Transfer: Min guard;BSC;Stand-pivot   Toileting- Clothing Manipulation and Hygiene: Minimal assistance       Functional mobility during ADLs: Min guard        Vision  no change from baseline                              Cognition   Behavior During Therapy: WFL for tasks assessed/performed Overall Cognitive Status: Within Functional Limits for tasks assessed                       Extremity/Trunk Assessment   WFL                        General Comments  pt very pleasant and cooperative    Pertinent Vitals/ Pain       Pain Assessment: 0-10 Pain Score: 3  Pain Location: R LE Pain Descriptors / Indicators: Sore Pain Intervention(s): Monitored during session;Premedicated before session;Repositioned  Home Living  lives with parents                                        Prior Functioning/Environment  mod I           Frequency Min 2X/week     Progress Toward Goals  OT Goals(current goals can now be found in the care plan section)  Progress towards OT goals: Progressing toward goals  Plan Discharge plan remains appropriate                     End of Session Equipment Utilized During Treatment: Gait belt;Other (comment) (BSC)   Activity Tolerance Patient tolerated treatment well   Patient Left in bed;with call bell/phone within reach             Time: 1131-1155 OT Time Calculation (min): 24 min  Charges: OT General Charges $OT Visit: 1 Procedure OT Treatments $Self Care/Home Management : 8-22 mins $Therapeutic Activity: 8-22 mins  Britt Bottom 12/01/2015, 1:20 PM

## 2015-12-01 NOTE — Progress Notes (Signed)
Physical Therapy Treatment Patient Details Name: [name] MRN: [id number] DOB: 01-01-1971 Today's Date: 12/01/2015    History of Present Illness 45 y.o.-year-old female who was admitted  with gangrenous changes to Rt LE. Pt previously underwent Chopart amputation as well as previous vascular limb salvage intervention for critical limb ischemia. Now s/p transtibial amputation. PMH: CHF, hypertension, dieabetes, ESRD, PVD, neuropathy.     PT Comments    Patient is making good progress with PT.  From a mobility standpoint anticipate patient will be ready for DC home with family support.     Follow Up Recommendations  Home health PT;Supervision for mobility/OOB     Equipment Recommendations  None recommended by PT    Recommendations for Other Services       Precautions / Restrictions Precautions Precautions: Fall Precaution Comments: wound vac Restrictions Weight Bearing Restrictions: Yes RLE Weight Bearing: Non weight bearing    Mobility  Bed Mobility Overal bed mobility: Needs Assistance Bed Mobility: Supine to Sit     Supine to sit: Modified independent (Device/Increase time) (minimal use of rail to assist)     General bed mobility comments: pt reports feeling confident with performing bed mobility at home.   Transfers Overall transfer level: Modified independent Equipment used: Rolling walker (2 wheeled) Transfers: Sit to/from Stand Sit to Stand: Modified independent (Device/Increase time)         General transfer comment: pt demonstrating safe and stable technique.   Ambulation/Gait Ambulation/Gait assistance: Supervision Ambulation Distance (Feet): 10 Feet Assistive device: Rolling walker (2 wheeled) Gait Pattern/deviations:  (swing-to pattern) Gait velocity: decreased   General Gait Details: stable ambulation, limited distance due to increasing pain.    Stairs            Wheelchair Mobility    Modified Rankin (Stroke Patients Only)        Balance Overall balance assessment: Needs assistance Sitting-balance support: No upper extremity supported Sitting balance-Leahy Scale: Good     Standing balance support: Bilateral upper extremity supported Standing balance-Leahy Scale: Poor Standing balance comment: using rw for support                    Cognition Arousal/Alertness: Awake/alert Behavior During Therapy: WFL for tasks assessed/performed Overall Cognitive Status: Within Functional Limits for tasks assessed                      Exercises      General Comments        Pertinent Vitals/Pain Pain Assessment: No/denies pain (at rest) Pain Intervention(s): Monitored during session    Home Living                      Prior Function            PT Goals (current goals can now be found in the care plan section) Acute Rehab PT Goals Patient Stated Goal: to go home PT Goal Formulation: With patient Time For Goal Achievement: 12/14/15 Potential to Achieve Goals: Good Progress towards PT goals: Progressing toward goals    Frequency  Min 3X/week    PT Plan Current plan remains appropriate    Co-evaluation             End of Session Equipment Utilized During Treatment: Gait belt Activity Tolerance: Patient tolerated treatment well Patient left: in chair;with call bell/phone within reach (Rt LE in elevation)     [id number] PT Time Calculation (min) (ACUTE ONLY): 16 min  Charges:  $Therapeutic Activity: 8-22 mins                    G Codes:      Cassell Clement, PT, CSCS Pager [phone] Office [phone]  12/01/2015, 8:57 AM

## 2015-12-01 NOTE — Discharge Summary (Signed)
Physician Discharge Summary  Patient ID: [name] MRN: [id number] DOB/AGE: 10-20-1970 45 y.o.  Admit date: 11/29/2015 Discharge date: 12/01/2015  Admission Diagnoses:Gangrene right lower extremity  Discharge Diagnoses:  Active Problems:   Below knee amputation status [facility])   Discharged Condition: stable  Hospital Course: Patient's hospital course was essentially unremarkable. She underwent transtibial amputation postoperatively she progressed well with therapy and was discharged to home in stable condition.  Consults: None  Significant Diagnostic Studies: labs: Routine labs  Treatments: surgery: See operative note  Discharge Exam: Blood pressure 135/59, pulse 98, temperature 99.1 F (37.3 C), temperature source Oral, resp. rate 17, height 5' (1.524 m), weight 59.109 kg (130 lb 5 oz), SpO2 93 %. Incision/Wound: Dressing clean and dry  Disposition: 03-Skilled Nursing Facility     Medication List    ASK your doctor about these medications        acetaminophen 325 MG tablet  Commonly known as:  TYLENOL  Take 2 tablets (650 mg total) by mouth every 6 (six) hours as needed for mild pain (or Fever >/= 101).     albuterol 108 (90 Base) MCG/ACT inhaler  Commonly known as:  PROVENTIL HFA;VENTOLIN HFA  Inhale 1 puff into the lungs every 6 (six) hours as needed for wheezing or shortness of breath.     amLODipine 10 MG tablet  Commonly known as:  NORVASC  Take 10 mg by mouth at bedtime.     aspirin 81 MG EC tablet  Take 1 tablet (81 mg total) by mouth daily.     cilostazol 100 MG tablet  Commonly known as:  PLETAL  Take 100 mg by mouth 2 (two) times daily.     cloNIDine 0.2 mg/24hr patch  Commonly known as:  CATAPRES - Dosed in mg/24 hr  Place 0.2 mg onto the skin every Friday.     enoxaparin 40 MG/0.4ML injection  Commonly known as:  LOVENOX  Inject 0.4 mLs (40 mg total) into the skin daily.     ferrous sulfate 325 (65 FE) MG tablet  Take 1 tablet (325 mg  total) by mouth 3 (three) times daily with meals.     insulin aspart 100 UNIT/ML injection  Commonly known as:  novoLOG  Inject 4-14 Units into the skin 3 (three) times daily before meals. Per sliding scale: 100-150, 4 units. 151-200, 6 units. 201-250, 8 units. 251-300, 10 units. 301-350, 12 units. Over 350, 14 units.     insulin detemir 100 UNIT/ML injection  Commonly known as:  LEVEMIR  Inject 14-22 Units into the skin See admin instructions. Take 14 units in the morning and 22 units at bedtime.     labetalol 300 MG tablet  Commonly known as:  NORMODYNE  Take 300 mg by mouth 2 (two) times daily.     mycophenolate 180 MG EC tablet  Commonly known as:  MYFORTIC  Take 360-540 mg by mouth 2 (two) times daily. Take three capsules (540mg ) in the morning and two capsules (360mg ) at night.     nitroGLYCERIN 0.2 mg/hr patch  Commonly known as:  NITRODUR - Dosed in mg/24 hr  Place 1 patch (0.2 mg total) onto the skin daily.     pantoprazole 40 MG tablet  Commonly known as:  PROTONIX  Take 40 mg by mouth at bedtime.     pravastatin 20 MG tablet  Commonly known as:  PRAVACHOL  Take 20 mg by mouth at bedtime.     senna-docusate 8.6-50 MG tablet  Commonly  known as:  Senokot-S  Take 1 tablet by mouth 2 (two) times daily.     sulfamethoxazole-trimethoprim 400-80 MG tablet  Commonly known as:  BACTRIM,SEPTRA  Take 1 tablet by mouth every Monday, Wednesday, and Friday.     tacrolimus 0.5 MG capsule  Commonly known as:  PROGRAF  Take 0.5 mg by mouth 2 (two) times daily. Take with 1mg  capsule to make a total of 1.5mg .     tacrolimus 1 MG capsule  Commonly known as:  PROGRAF  Take 1 mg by mouth 2 (two) times daily. Take with 0.5mg  capsule to make a total of 1.5mg .     Vitamin D (Ergocalciferol) 50000 units Caps capsule  Commonly known as:  DRISDOL  Take 50,000 Units by mouth every Sunday.           Follow-up Information    Follow up with Kesler Wickham V, MD In 1 week.   Specialty:   Orthopedic Surgery   Contact information:   [place] [address] [phone]       Signed: Newt Minion 12/01/2015, 6:32 AM

## 2015-12-01 NOTE — Progress Notes (Signed)
Order rec'd to discontinue wound vac to right BKA and place black sock per Dr. Sharol Given.

## 2015-12-02 LAB — GLUCOSE, CAPILLARY
Glucose-Capillary: 50 mg/dL — ABNORMAL LOW (ref 65–99)
Glucose-Capillary: 84 mg/dL (ref 65–99)
Glucose-Capillary: 84 mg/dL (ref 65–99)
Glucose-Capillary: 128 mg/dL — ABNORMAL HIGH (ref 65–99)
Glucose-Capillary: 118 mg/dL — ABNORMAL HIGH (ref 65–99)

## 2015-12-02 MED ORDER — GLUCERNA SHAKE PO LIQD
237.0000 mL | Freq: Three times a day (TID) | ORAL | Status: DC
  Administered 2015-12-02 – 2015-12-04 (×6): 237 mL via ORAL

## 2015-12-02 MED ORDER — GLUCERNA SHAKE PO LIQD: 237.0000 mL | Freq: Two times a day (BID) | ORAL | Status: DC

## 2015-12-02 NOTE — Progress Notes (Signed)
Subjective: Patient stable and comfortable in bed   Objective: Vital signs in last 24 hours: Temp:  [98.5 F (36.9 C)-99 F (37.2 C)] 98.6 F (37 C) (06/17 0428) Pulse Rate:  [92-99] 94 (06/17 0428) Resp:  [16-17] 16 (06/17 0428) BP: (125-155)/(58-61) 142/61 mmHg (06/17 0428) SpO2:  [95 %-100 %] 96 % (06/17 0428)  Intake/Output from previous day: 06/16 0701 - 06/17 0700 In: 240 [P.O.:240] Out: 0  Intake/Output this shift:    Exam:  BKA stump dressing dry  Labs: No results for input(s): HGB in the last 72 hours. No results for input(s): WBC, RBC, HCT, PLT in the last 72 hours. No results for input(s): NA, K, CL, CO2, BUN, CREATININE, GLUCOSE, CALCIUM in the last 72 hours. No results for input(s): LABPT, INR in the last 72 hours.  Assessment/Plan: Plan discharge next week once medically stable and ready   Makaylynn Bonillas SCOTT 12/02/2015, 11:50 AM

## 2015-12-02 NOTE — Progress Notes (Addendum)
Initial Nutrition Assessment  DOCUMENTATION CODES:   Not applicable  INTERVENTION:  Provide Glucerna Shake po TID, each supplement provides 220 kcal and 10 grams of protein.  Encourage adequate PO intake.   NUTRITION DIAGNOSIS:   Increased nutrient needs related to wound healing as evidenced by estimated needs.  GOAL:   Patient will meet greater than or equal to 90% of their needs  MONITOR:   PO intake, Supplement acceptance, Weight trends, Labs, I & O's, Skin  REASON FOR ASSESSMENT:   Consult Wound healing  ASSESSMENT:   45 y.o.-year-old female who was admitted with gangrenous changes to Rt LE. Pt previously underwent Chopart amputation as well as previous vascular limb salvage intervention for critical limb ischemia. Now s/p transtibial amputation. PMH: CHF, hypertension, dieabetes, ESRD s/p kidney transplant 2014, PVD, neuropathy.   PROCEDURE (6/14): Transtibial amputation Application of Prevena wound VAC  Meal completion has been 25%.Pt reports appetite has been varied which has been ongoing over the past 2 months. Pt reports she usually consumes one meal a day with Glucerna Shakes at least twice daily. Usual body weight reported to be~141 lbs. Per Epic weight records, pt with a 7.8% weight loss in 2 months. Pt reports weight loss is associated with recent illness and multiple surgeries for amputation. RD to order Glucerna Shake to aid in caloric and protein needs as well as in wound healing. Pt encouraged to eat her food at meals and to drink her supplements. Possible plans for discharge on Monday.   Pt with no observed significant fat or muscle mass loss.  Labs and medications reviewed.   Diet Order:  Diet regular Room service appropriate?: Yes; Fluid consistency:: Thin Diet - low sodium heart healthy  Skin:  Wound (see comment) (Incision R leg with wound VAC)  Last BM:  6/14  Height:   Ht Readings from Last 1 Encounters:  11/29/15 5' (1.524 m)    Weight:    Wt Readings from Last 1 Encounters:  11/29/15 130 lb 5 oz (59.109 kg)    Ideal Body Weight:  42.5 kg (adjusted for BKA)  BMI:  Body mass index is 25.45 kg/(m^2).  Estimated Nutritional Needs:   Kcal:  1600-1800  Protein:  75-90 grams  Fluid:  1.6 - 1.8 L/day  EDUCATION NEEDS:   Education needs addressed  Corrin Parker, MS, RD, LDN Pager # [phone] After hours/ weekend pager # [phone]

## 2015-12-02 NOTE — Progress Notes (Signed)
Hypoglycemic Event  CBG: 50  Treatment: 15 GM carbohydrate snack  Symptoms: none  Follow-up CBG: [id number] CBG Result:84  Possible Reasons for Event: Unknown  Comments/MD notified:no    Corbin Ade

## 2015-12-03 LAB — GLUCOSE, CAPILLARY
Glucose-Capillary: 61 mg/dL — ABNORMAL LOW (ref 65–99)
Glucose-Capillary: 102 mg/dL — ABNORMAL HIGH (ref 65–99)
Glucose-Capillary: 138 mg/dL — ABNORMAL HIGH (ref 65–99)
Glucose-Capillary: 62 mg/dL — ABNORMAL LOW (ref 65–99)
Glucose-Capillary: 81 mg/dL (ref 65–99)

## 2015-12-03 NOTE — Progress Notes (Signed)
Physical Therapy Treatment Patient Details Name: [name] MRN: [id number] DOB: 04-02-1971 Today's Date: 12/03/2015    History of Present Illness 45 y.o.-year-old female who was admitted  with gangrenous changes to Rt LE. Pt previously underwent Chopart amputation as well as previous vascular limb salvage intervention for critical limb ischemia. Now s/p transtibial amputation. PMH: CHF, hypertension, dieabetes, ESRD, PVD, neuropathy.     PT Comments    Pt performed increased gait but limited due to fatigue and nausea followed by dry heaving.  Pt required encouragement for participation but remains to mobilize fairly well.  Will continue to follow pt per POC during acute hospitalization.     Follow Up Recommendations  Home health PT;Supervision for mobility/OOB     Equipment Recommendations  None recommended by PT    Recommendations for Other Services       Precautions / Restrictions Precautions Precautions: Fall Restrictions Weight Bearing Restrictions: Yes RLE Weight Bearing: Non weight bearing    Mobility  Bed Mobility Overal bed mobility: Needs Assistance Bed Mobility: Supine to Sit     Supine to sit: Modified independent (Device/Increase time) (remains to use rail for assistance.  )     General bed mobility comments: Pt did not require physical assist however HOB elevated.   Transfers Overall transfer level: Needs assistance Equipment used: Rolling walker (2 wheeled) Transfers: Sit to/from Stand Sit to Stand: Supervision Stand pivot transfers: Supervision       General transfer comment: Pt required cues to push from seated surface.  Pt pulls on RW despite cues to push from seated surface.  Pt educated on safety.    Ambulation/Gait Ambulation/Gait assistance: Supervision Ambulation Distance (Feet): 18 Feet (+14 ft.  Pt required seated rest break due to nausea.  ) Assistive device: Rolling walker (2 wheeled) Gait Pattern/deviations: Step-to  pattern;Antalgic;Trunk flexed (hop to pattern.  ) Gait velocity: decreased   General Gait Details: stable ambulation, limited distance due to nausea and fatigue.     Stairs            Wheelchair Mobility    Modified Rankin (Stroke Patients Only)       Balance Overall balance assessment: Needs assistance   Sitting balance-Leahy Scale: Good       Standing balance-Leahy Scale: Poor                      Cognition Arousal/Alertness: Awake/alert Behavior During Therapy: WFL for tasks assessed/performed Overall Cognitive Status: Within Functional Limits for tasks assessed                      Exercises      General Comments        Pertinent Vitals/Pain Pain Assessment: 0-10 Pain Score: 2  Pain Location: RLE Pain Descriptors / Indicators: Guarding;Discomfort;Sore Pain Intervention(s): Limited activity within patient's tolerance;Repositioned    Home Living                      Prior Function            PT Goals (current goals can now be found in the care plan section) Acute Rehab PT Goals Patient Stated Goal: to go home Potential to Achieve Goals: Good Progress towards PT goals: Progressing toward goals    Frequency  Min 3X/week    PT Plan Current plan remains appropriate    Co-evaluation             End of Session  Equipment Utilized During Treatment: Gait belt Activity Tolerance: Patient tolerated treatment well Patient left: in chair;with call bell/phone within reach (RLE in elevation. )     Time: [id number] PT Time Calculation (min) (ACUTE ONLY): 17 min  Charges:  $Gait Training: 8-22 mins                    G Codes:      Cristela Blue 12/22/15, 2:49 PM  Governor Rooks, PTA pager [phone]

## 2015-12-03 NOTE — Progress Notes (Signed)
Patient stable she is is in a good mood  this morning as she typically is  Black stump shrinker in position pain is controlled  Anticipate discharge to skilled nursing this week.  Her last experience in skilled nursing facility was not great.  She is concerned about the potential affect that experience may have on her kidney transplant

## 2015-12-04 LAB — GLUCOSE, CAPILLARY
Glucose-Capillary: 147 mg/dL — ABNORMAL HIGH (ref 65–99)
Glucose-Capillary: 176 mg/dL — ABNORMAL HIGH (ref 65–99)
Glucose-Capillary: 102 mg/dL — ABNORMAL HIGH (ref 65–99)
Glucose-Capillary: 140 mg/dL — ABNORMAL HIGH (ref 65–99)
Glucose-Capillary: 106 mg/dL — ABNORMAL HIGH (ref 65–99)

## 2015-12-04 MED ORDER — DOCUSATE SODIUM 100 MG PO CAPS
100.0000 mg | ORAL_CAPSULE | Freq: Two times a day (BID) | ORAL | Status: DC
  Administered 2015-12-04 (×2): 100 mg via ORAL
  Filled 2015-12-04 (×2): qty 1

## 2015-12-04 MED ORDER — POLYETHYLENE GLYCOL 3350 17 G PO PACK
17.0000 g | PACK | Freq: Every day | ORAL | Status: DC | PRN
  Administered 2015-12-04: 17 g via ORAL
  Filled 2015-12-04: qty 1

## 2015-12-04 MED ORDER — BISACODYL 10 MG RE SUPP: 10.0000 mg | Freq: Every day | RECTAL | Status: DC | PRN

## 2015-12-04 MED ORDER — MAGNESIUM CITRATE PO SOLN
1.0000 | Freq: Once | ORAL | Status: AC
  Administered 2015-12-04: 1 via ORAL
  Filled 2015-12-04: qty 296

## 2015-12-04 NOTE — Progress Notes (Addendum)
Inpatient Diabetes Program Recommendations  AACE/ADA: New Consensus Statement on Inpatient Glycemic Control (2015)  Target Ranges:  Prepandial:   less than 140 mg/dL      Peak postprandial:   less than 180 mg/dL (1-2 hours)      Critically ill patients:  140 - 180 mg/dL   Results for [name], [name] (MRN [id number]) as of 12/04/2015 14:03  Ref. Range 12/03/2015 06:15 12/03/2015 10:55 12/03/2015 16:09 12/03/2015 21:48 12/03/2015 22:14  Glucose-Capillary Latest Ref Range: 65-99 mg/dL 61 (L) 102 (H) 138 (H) 62 (L) 81   Results for [name], [name] (MRN [id number]) as of 12/04/2015 14:03  Ref. Range 12/04/2015 02:12 12/04/2015 07:04 12/04/2015 11:16  Glucose-Capillary Latest Ref Range: 65-99 mg/dL 147 (H) 176 (H) 102 (H)    Home DM Meds: Levemir 14 units AM/ 22 units PM       Novolog 4-14 units tid  Current Insulin Orders: Levemir 11 units QHS      Novolog Sensitive Correction Scale/ SSI (0-9 units) TID AC       Novolog 3 units tidwc    -Hypoglycemic yesterday morning and then again at bedtime.  -RN held Levemir dose last night.     MD- Please consider the following in-hospital insulin adjustments:  1. Reduce Levemir to 8 units QHS  2. Reduce Novolog Meal Coverage to 2 units tidwc     --Will follow patient during hospitalization--  Wyn Quaker RN, MSN, CDE Diabetes Coordinator Inpatient Glycemic Control Team Team Pager: [phone] (8a-5p)

## 2015-12-04 NOTE — Progress Notes (Signed)
Patient ID: [name], female   DOB: 1970-12-04, 45 y.o.   MRN: [id number] Physical therapy is recommending discharge with home health physical therapy. We will see how patient does today with therapy. The black compression shrinker is clean and dry.. Patient complains of constipation this morning orders written.

## 2015-12-04 NOTE — Progress Notes (Signed)
Occupational Therapy Treatment Patient Details Name: [name] MRN: [id number] DOB: Jul 07, 1970 Today's Date: 12/04/2015    History of present illness 45 y.o.-year-old female who was admitted  with gangrenous changes to Rt LE. Pt previously underwent [place] amputation as well as previous vascular limb salvage intervention for critical limb ischemia. Now s/p transtibial amputation. PMH: CHF, hypertension, dieabetes, ESRD, PVD, neuropathy.    OT comments  Pt able to ambulate with RW from recliner to bathroom to sit on toilet. Pt reports feelings of abdominal discomfort sue to constipation and declined bathing and dressing this morning with OT. Pt was not able to have a BM during session. OT will continue to follow acutely    Follow Up Recommendations  Home health OT;Supervision/Assistance - 24 hour    Equipment Recommendations  None recommended by OT    Recommendations for Other Services      Precautions / Restrictions Precautions Precautions: Fall Restrictions RLE Weight Bearing: Non weight bearing       Mobility Bed Mobility               General bed mobility comments: pt up in recliner  Transfers Overall transfer level: Needs assistance Equipment used: Rolling walker (2 wheeled) Transfers: Sit to/from Stand Sit to Stand: Supervision Stand pivot transfers: Supervision       General transfer comment: cues for correct hand placement    Balance   Sitting-balance support: No upper extremity supported;Feet supported Sitting balance-Leahy Scale: Good     Standing balance support: Bilateral upper extremity supported;During functional activity Standing balance-Leahy Scale: Poor Standing balance comment: uess RW for balance/support                   ADL       Grooming: Wash/dry hands;Wash/dry face;Minimal assistance;Standing Grooming Details (indicate cue type and reason): min A for balance                 Toilet Transfer:  BSC;Ambulation;RW;Supervision/safety;Min guard   Toileting- Clothing Manipulation and Hygiene: Minimal assistance   Tub/ Shower Transfer: 3 in 1;Supervision/safety;Min guard;Ambulation;Rolling walker   Functional mobility during ADLs: Supervision/safety;Min guard General ADL Comments: pt declined bathing and dressing this morning with OT due to abdominal discomfort      Vision  no change from baseline                              Cognition   Behavior During Therapy: WFL for tasks assessed/performed Overall Cognitive Status: Within Functional Limits for tasks assessed                       Extremity/Trunk Assessment   generalized weakness                        General Comments  pt pleasant    Pertinent Vitals/ Pain       Pain Assessment: 0-10 Pain Score: 5  Pain Location: abdomen from constipation Pain Descriptors / Indicators: Discomfort Pain Intervention(s): Limited activity within patient's tolerance;Monitored during session;Repositioned  Home Living  with family                                        Prior Functioning/Environment  mod I            Frequency Min 2X/week  Progress Toward Goals  OT Goals(current goals can now be found in the care plan section)  Progress towards OT goals: OT to reassess next treatment     Plan Discharge plan remains appropriate                     End of Session Equipment Utilized During Treatment: Gait belt;Rolling walker;Other (comment) (3 in 1)   Activity Tolerance Other (comment) (pt limited by discomfort in abdomen, constipation)   Patient Left with call bell/phone within reach;in chair             Time: [id number] OT Time Calculation (min): 13 min  Charges: OT General Charges $OT Visit: 1 Procedure OT Treatments $Self Care/Home Management : 8-22 mins  Britt Bottom 12/04/2015, 1:05 PM

## 2015-12-04 NOTE — Care Management Important Message (Signed)
Important Message  Patient Details  Name: [name] MRN: [id number] Date of Birth: Feb 07, 1971   Medicare Important Message Given:  Yes    Loann Quill 12/04/2015, 10:27 AM

## 2015-12-05 LAB — GLUCOSE, CAPILLARY: Glucose-Capillary: 99 mg/dL (ref 65–99)

## 2015-12-05 MED ORDER — OXYCODONE-ACETAMINOPHEN 5-325 MG PO TABS: 1.0000 | ORAL_TABLET | ORAL | Status: DC | PRN

## 2015-12-05 MED ORDER — METHOCARBAMOL 500 MG PO TABS: 500.0000 mg | ORAL_TABLET | Freq: Three times a day (TID) | ORAL | Status: DC | PRN

## 2015-12-05 NOTE — Discharge Summary (Signed)
Physician Discharge Summary  Patient ID: [name] MRN: [id number] DOB/AGE: 1971-05-29 45 y.o.  Admit date: 11/29/2015 Discharge date: 12/05/2015  Admission Diagnoses:Osteomyelitis ulceration gangrene right lower extremity  Discharge Diagnoses:  Active Problems:   Below knee amputation status [employer])   Discharged Condition: stable  Hospital Course: Patient's hospital course was essentially unremarkable. She underwent a right transtibial amputation postoperatively she progressed slowly with therapy and was discharged to home in stable condition.  Consults: None  Significant Diagnostic Studies: labs: Routine labs  Treatments: surgery: See operative note  Discharge Exam: Blood pressure 143/64, pulse 101, temperature 99.3 F (37.4 C), temperature source Oral, resp. rate 16, height 5' (1.524 m), weight 59.109 kg (130 lb 5 oz), SpO2 93 %. Incision/Wound: Dressing clean and dry  Disposition: 03-Skilled Nursing Facility  Discharge Instructions    Call MD / Call 911    Complete by:  As directed   If you experience chest pain or shortness of breath, CALL 911 and be transported to the hospital emergency room.  If you develope a fever above 101 F, pus (white drainage) or increased drainage or redness at the wound, or calf pain, call your surgeon's office.     Call MD / Call 911    Complete by:  As directed   If you experience chest pain or shortness of breath, CALL 911 and be transported to the hospital emergency room.  If you develope a fever above 101 F, pus (white drainage) or increased drainage or redness at the wound, or calf pain, call your surgeon's office.     Constipation Prevention    Complete by:  As directed   Drink plenty of fluids.  Prune juice may be helpful.  You may use a stool softener, such as Colace (over the counter) 100 mg twice a day.  Use MiraLax (over the counter) for constipation as needed.     Constipation Prevention    Complete by:  As directed   Drink  plenty of fluids.  Prune juice may be helpful.  You may use a stool softener, such as Colace (over the counter) 100 mg twice a day.  Use MiraLax (over the counter) for constipation as needed.     Diet - low sodium heart healthy    Complete by:  As directed      Diet - low sodium heart healthy    Complete by:  As directed      Increase activity slowly as tolerated    Complete by:  As directed      Increase activity slowly as tolerated    Complete by:  As directed             Medication List    TAKE these medications        acetaminophen 325 MG tablet  Commonly known as:  TYLENOL  Take 2 tablets (650 mg total) by mouth every 6 (six) hours as needed for mild pain (or Fever >/= 101).     albuterol 108 (90 Base) MCG/ACT inhaler  Commonly known as:  PROVENTIL HFA;VENTOLIN HFA  Inhale 1 puff into the lungs every 6 (six) hours as needed for wheezing or shortness of breath.     amLODipine 10 MG tablet  Commonly known as:  NORVASC  Take 10 mg by mouth at bedtime.     aspirin 81 MG EC tablet  Take 1 tablet (81 mg total) by mouth daily.     cilostazol 100 MG tablet  Commonly known as:  PLETAL  Take 100 mg by mouth 2 (two) times daily.     cloNIDine 0.2 mg/24hr patch  Commonly known as:  CATAPRES - Dosed in mg/24 hr  Place 0.2 mg onto the skin every Friday.     enoxaparin 40 MG/0.4ML injection  Commonly known as:  LOVENOX  Inject 0.4 mLs (40 mg total) into the skin daily.     ferrous sulfate 325 (65 FE) MG tablet  Take 1 tablet (325 mg total) by mouth 3 (three) times daily with meals.     insulin aspart 100 UNIT/ML injection  Commonly known as:  novoLOG  Inject 4-14 Units into the skin 3 (three) times daily before meals. Per sliding scale: 100-150, 4 units. 151-200, 6 units. 201-250, 8 units. 251-300, 10 units. 301-350, 12 units. Over 350, 14 units.     insulin detemir 100 UNIT/ML injection  Commonly known as:  LEVEMIR  Inject 14-22 Units into the skin See admin instructions.  Take 14 units in the morning and 22 units at bedtime.     labetalol 300 MG tablet  Commonly known as:  NORMODYNE  Take 300 mg by mouth 2 (two) times daily.     methocarbamol 500 MG tablet  Commonly known as:  ROBAXIN  Take 1 tablet (500 mg total) by mouth every 8 (eight) hours as needed for muscle spasms.     mycophenolate 180 MG EC tablet  Commonly known as:  MYFORTIC  Take 360-540 mg by mouth 2 (two) times daily. Take three capsules (540mg ) in the morning and two capsules (360mg ) at night.     nitroGLYCERIN 0.2 mg/hr patch  Commonly known as:  NITRODUR - Dosed in mg/24 hr  Place 1 patch (0.2 mg total) onto the skin daily.     oxyCODONE-acetaminophen 5-325 MG tablet  Commonly known as:  ROXICET  Take 1 tablet by mouth every 4 (four) hours as needed for severe pain.     oxyCODONE-acetaminophen 5-325 MG tablet  Commonly known as:  ROXICET  Take 1 tablet by mouth every 4 (four) hours as needed for severe pain.     pantoprazole 40 MG tablet  Commonly known as:  PROTONIX  Take 40 mg by mouth at bedtime.     pravastatin 20 MG tablet  Commonly known as:  PRAVACHOL  Take 20 mg by mouth at bedtime.     senna-docusate 8.6-50 MG tablet  Commonly known as:  Senokot-S  Take 1 tablet by mouth 2 (two) times daily.     sulfamethoxazole-trimethoprim 400-80 MG tablet  Commonly known as:  BACTRIM,SEPTRA  Take 1 tablet by mouth every Monday, Wednesday, and Friday.     tacrolimus 0.5 MG capsule  Commonly known as:  PROGRAF  Take 0.5 mg by mouth 2 (two) times daily. Take with 1mg  capsule to make a total of 1.5mg .     tacrolimus 1 MG capsule  Commonly known as:  PROGRAF  Take 1 mg by mouth 2 (two) times daily. Take with 0.5mg  capsule to make a total of 1.5mg .     Vitamin D (Ergocalciferol) 50000 units Caps capsule  Commonly known as:  DRISDOL  Take 50,000 Units by mouth every Sunday.           Follow-up Information    Follow up with Doc Mandala V, MD In 1 week.   Specialty:   Orthopedic Surgery   Contact information:   [place] [address] [phone]       Signed: Newt Minion 12/05/2015,  6:23 AM

## 2015-12-06 ENCOUNTER — Emergency Department (HOSPITAL_COMMUNITY): Payer: Medicare Other

## 2015-12-06 ENCOUNTER — Observation Stay (HOSPITAL_COMMUNITY)
Admission: EM | Admit: 2015-12-06 | Discharge: 2015-12-11 | Disposition: A | Discharge status: Home Health | Payer: Medicare Other | Attending: Internal Medicine | Admitting: Internal Medicine

## 2015-12-06 ENCOUNTER — Observation Stay (HOSPITAL_COMMUNITY): Payer: Medicare Other

## 2015-12-06 ENCOUNTER — Telehealth: Payer: Self-pay | Admitting: *Deleted

## 2015-12-06 ENCOUNTER — Other Ambulatory Visit: Payer: Self-pay

## 2015-12-06 ENCOUNTER — Encounter (HOSPITAL_COMMUNITY): Payer: Self-pay | Admitting: Emergency Medicine

## 2015-12-06 DIAGNOSIS — E1140 Type 2 diabetes mellitus with diabetic neuropathy, unspecified: Secondary | ICD-10-CM | POA: Insufficient documentation

## 2015-12-06 DIAGNOSIS — R7989 Other specified abnormal findings of blood chemistry: Secondary | ICD-10-CM

## 2015-12-06 DIAGNOSIS — K8020 Calculus of gallbladder without cholecystitis without obstruction: Secondary | ICD-10-CM

## 2015-12-06 DIAGNOSIS — IMO0002 Reserved for concepts with insufficient information to code with codable children: Secondary | ICD-10-CM | POA: Diagnosis present

## 2015-12-06 DIAGNOSIS — N390 Urinary tract infection, site not specified: Secondary | ICD-10-CM | POA: Diagnosis not present

## 2015-12-06 DIAGNOSIS — K810 Acute cholecystitis: Secondary | ICD-10-CM | POA: Diagnosis present

## 2015-12-06 DIAGNOSIS — I132 Hypertensive heart and chronic kidney disease with heart failure and with stage 5 chronic kidney disease, or end stage renal disease: Secondary | ICD-10-CM | POA: Insufficient documentation

## 2015-12-06 DIAGNOSIS — R945 Abnormal results of liver function studies: Secondary | ICD-10-CM | POA: Diagnosis not present

## 2015-12-06 DIAGNOSIS — R4182 Altered mental status, unspecified: Secondary | ICD-10-CM | POA: Diagnosis not present

## 2015-12-06 DIAGNOSIS — D509 Iron deficiency anemia, unspecified: Secondary | ICD-10-CM | POA: Diagnosis not present

## 2015-12-06 DIAGNOSIS — Z794 Long term (current) use of insulin: Secondary | ICD-10-CM | POA: Insufficient documentation

## 2015-12-06 DIAGNOSIS — E43 Unspecified severe protein-calorie malnutrition: Secondary | ICD-10-CM | POA: Diagnosis present

## 2015-12-06 DIAGNOSIS — I509 Heart failure, unspecified: Secondary | ICD-10-CM | POA: Diagnosis not present

## 2015-12-06 DIAGNOSIS — Z940 Kidney transplant status: Secondary | ICD-10-CM | POA: Diagnosis not present

## 2015-12-06 DIAGNOSIS — J9601 Acute respiratory failure with hypoxia: Secondary | ICD-10-CM | POA: Diagnosis not present

## 2015-12-06 DIAGNOSIS — K8000 Calculus of gallbladder with acute cholecystitis without obstruction: Secondary | ICD-10-CM | POA: Diagnosis not present

## 2015-12-06 DIAGNOSIS — E876 Hypokalemia: Secondary | ICD-10-CM | POA: Diagnosis not present

## 2015-12-06 DIAGNOSIS — R7401 Elevation of levels of liver transaminase levels: Secondary | ICD-10-CM | POA: Diagnosis present

## 2015-12-06 DIAGNOSIS — E1122 Type 2 diabetes mellitus with diabetic chronic kidney disease: Secondary | ICD-10-CM | POA: Insufficient documentation

## 2015-12-06 DIAGNOSIS — N189 Chronic kidney disease, unspecified: Secondary | ICD-10-CM | POA: Diagnosis not present

## 2015-12-06 DIAGNOSIS — E1165 Type 2 diabetes mellitus with hyperglycemia: Secondary | ICD-10-CM | POA: Diagnosis not present

## 2015-12-06 DIAGNOSIS — I10 Essential (primary) hypertension: Secondary | ICD-10-CM | POA: Diagnosis present

## 2015-12-06 DIAGNOSIS — D62 Acute posthemorrhagic anemia: Secondary | ICD-10-CM | POA: Diagnosis not present

## 2015-12-06 DIAGNOSIS — N186 End stage renal disease: Secondary | ICD-10-CM | POA: Insufficient documentation

## 2015-12-06 DIAGNOSIS — N179 Acute kidney failure, unspecified: Secondary | ICD-10-CM | POA: Diagnosis present

## 2015-12-06 DIAGNOSIS — D631 Anemia in chronic kidney disease: Secondary | ICD-10-CM | POA: Diagnosis not present

## 2015-12-06 DIAGNOSIS — J9811 Atelectasis: Secondary | ICD-10-CM | POA: Diagnosis not present

## 2015-12-06 DIAGNOSIS — I129 Hypertensive chronic kidney disease with stage 1 through stage 4 chronic kidney disease, or unspecified chronic kidney disease: Secondary | ICD-10-CM | POA: Diagnosis not present

## 2015-12-06 DIAGNOSIS — E1151 Type 2 diabetes mellitus with diabetic peripheral angiopathy without gangrene: Secondary | ICD-10-CM | POA: Diagnosis not present

## 2015-12-06 DIAGNOSIS — Z89511 Acquired absence of right leg below knee: Secondary | ICD-10-CM

## 2015-12-06 DIAGNOSIS — K219 Gastro-esophageal reflux disease without esophagitis: Secondary | ICD-10-CM | POA: Diagnosis not present

## 2015-12-06 DIAGNOSIS — Z7982 Long term (current) use of aspirin: Secondary | ICD-10-CM | POA: Diagnosis not present

## 2015-12-06 DIAGNOSIS — R404 Transient alteration of awareness: Secondary | ICD-10-CM | POA: Diagnosis not present

## 2015-12-06 DIAGNOSIS — R740 Nonspecific elevation of levels of transaminase and lactic acid dehydrogenase [LDH]: Secondary | ICD-10-CM

## 2015-12-06 DIAGNOSIS — R197 Diarrhea, unspecified: Secondary | ICD-10-CM | POA: Insufficient documentation

## 2015-12-06 DIAGNOSIS — R531 Weakness: Secondary | ICD-10-CM | POA: Diagnosis not present

## 2015-12-06 DIAGNOSIS — Z992 Dependence on renal dialysis: Secondary | ICD-10-CM | POA: Insufficient documentation

## 2015-12-06 LAB — URINE MICROSCOPIC-ADD ON

## 2015-12-06 LAB — URINALYSIS, ROUTINE W REFLEX MICROSCOPIC
Glucose, UA: NEGATIVE mg/dL
Ketones, ur: 15 mg/dL — AB
Nitrite: NEGATIVE
Protein, ur: NEGATIVE mg/dL
Specific Gravity, Urine: 1.022 (ref 1.005–1.030)
pH: 5 (ref 5.0–8.0)

## 2015-12-06 LAB — I-STAT ARTERIAL BLOOD GAS, ED
Bicarbonate: 24.8 mEq/L — ABNORMAL HIGH (ref 20.0–24.0)
O2 Saturation: 86 %
Patient temperature: 98.6
TCO2: 26 mmol/L (ref 0–100)
pCO2 arterial: 41.8 mmHg (ref 35.0–45.0)
pH, Arterial: 7.382 (ref 7.350–7.450)
pO2, Arterial: 53 mmHg — ABNORMAL LOW (ref 80.0–100.0)

## 2015-12-06 LAB — COMPREHENSIVE METABOLIC PANEL
ALT: 113 U/L — ABNORMAL HIGH (ref 14–54)
AST: 88 U/L — ABNORMAL HIGH (ref 15–41)
Albumin: 2.2 g/dL — ABNORMAL LOW (ref 3.5–5.0)
Alkaline Phosphatase: 793 U/L — ABNORMAL HIGH (ref 38–126)
Anion gap: 10 (ref 5–15)
BUN: 43 mg/dL — ABNORMAL HIGH (ref 6–20)
CO2: 25 mmol/L (ref 22–32)
Calcium: 8.8 mg/dL — ABNORMAL LOW (ref 8.9–10.3)
Chloride: 99 mmol/L — ABNORMAL LOW (ref 101–111)
Creatinine, Ser: 1.73 mg/dL — ABNORMAL HIGH (ref 0.44–1.00)
GFR calc Af Amer: 40 mL/min — ABNORMAL LOW (ref >60)
GFR calc non Af Amer: 34 mL/min — ABNORMAL LOW (ref >60)
Glucose, Bld: 137 mg/dL — ABNORMAL HIGH (ref 65–99)
Potassium: 4.4 mmol/L (ref 3.5–5.1)
Sodium: 134 mmol/L — ABNORMAL LOW (ref 135–145)
Total Bilirubin: 1.1 mg/dL (ref 0.3–1.2)
Total Protein: 5.6 g/dL — ABNORMAL LOW (ref 6.5–8.1)

## 2015-12-06 LAB — CBC WITH DIFFERENTIAL/PLATELET
Basophils Absolute: 0 10*3/uL (ref 0.0–0.1)
Basophils Relative: 0 %
Eosinophils Absolute: 0.1 10*3/uL (ref 0.0–0.7)
Eosinophils Relative: 2 %
HCT: 22.5 % — ABNORMAL LOW (ref 36.0–46.0)
Hemoglobin: 6.8 g/dL — CL (ref 12.0–15.0)
Lymphocytes Relative: 18 %
Lymphs Abs: 0.7 10*3/uL (ref 0.7–4.0)
MCH: 22.7 pg — ABNORMAL LOW (ref 26.0–34.0)
MCHC: 30.2 g/dL (ref 30.0–36.0)
MCV: 75.3 fL — ABNORMAL LOW (ref 78.0–100.0)
Monocytes Absolute: 0.7 10*3/uL (ref 0.1–1.0)
Monocytes Relative: 17 %
Neutro Abs: 2.5 10*3/uL (ref 1.7–7.7)
Neutrophils Relative %: 63 %
Platelets: 353 10*3/uL (ref 150–400)
RBC: 2.99 MIL/uL — ABNORMAL LOW (ref 3.87–5.11)
RDW: 17.9 % — ABNORMAL HIGH (ref 11.5–15.5)
WBC: 4 10*3/uL (ref 4.0–10.5)

## 2015-12-06 LAB — BRAIN NATRIURETIC PEPTIDE: B Natriuretic Peptide: 221.9 pg/mL — ABNORMAL HIGH (ref 0.0–100.0)

## 2015-12-06 LAB — GLUCOSE, CAPILLARY: Glucose-Capillary: 125 mg/dL — ABNORMAL HIGH (ref 65–99)

## 2015-12-06 LAB — I-STAT TROPONIN, ED: Troponin i, poc: 0 ng/mL (ref 0.00–0.08)

## 2015-12-06 LAB — AMMONIA: Ammonia: 21 umol/L (ref 9–35)

## 2015-12-06 LAB — APTT: aPTT: 35 seconds (ref 24–37)

## 2015-12-06 LAB — PROTIME-INR
INR: 1.5 — ABNORMAL HIGH (ref 0.00–1.49)
Prothrombin Time: 18.2 seconds — ABNORMAL HIGH (ref 11.6–15.2)

## 2015-12-06 LAB — PREPARE RBC (CROSSMATCH)

## 2015-12-06 MED ORDER — SENNOSIDES-DOCUSATE SODIUM 8.6-50 MG PO TABS
1.0000 | ORAL_TABLET | Freq: Two times a day (BID) | ORAL | Status: DC
  Administered 2015-12-07 (×2): 1 via ORAL
  Filled 2015-12-06 (×2): qty 1

## 2015-12-06 MED ORDER — OXYCODONE-ACETAMINOPHEN 5-325 MG PO TABS
1.0000 | ORAL_TABLET | ORAL | Status: DC | PRN
  Administered 2015-12-07 – 2015-12-08 (×3): 1 via ORAL
  Filled 2015-12-06 (×3): qty 1

## 2015-12-06 MED ORDER — MYCOPHENOLATE SODIUM 180 MG PO TBEC
360.0000 mg | DELAYED_RELEASE_TABLET | Freq: Two times a day (BID) | ORAL | Status: DC
Start: 2015-12-06 — End: 2015-12-06

## 2015-12-06 MED ORDER — SULFAMETHOXAZOLE-TRIMETHOPRIM 400-80 MG PO TABS: 1.0000 | ORAL_TABLET | ORAL | Status: DC

## 2015-12-06 MED ORDER — TACROLIMUS 0.5 MG PO CAPS: 0.5000 mg | ORAL_CAPSULE | Freq: Two times a day (BID) | ORAL | Status: DC

## 2015-12-06 MED ORDER — SODIUM CHLORIDE 0.9 % IV SOLN
INTRAVENOUS | Status: AC
  Administered 2015-12-07: 01:00:00 via INTRAVENOUS

## 2015-12-06 MED ORDER — CILOSTAZOL 100 MG PO TABS
100.0000 mg | ORAL_TABLET | Freq: Two times a day (BID) | ORAL | Status: DC
  Administered 2015-12-07 – 2015-12-11 (×9): 100 mg via ORAL
  Filled 2015-12-06 (×9): qty 1

## 2015-12-06 MED ORDER — INSULIN DETEMIR 100 UNIT/ML ~~LOC~~ SOLN
10.0000 [IU] | Freq: Two times a day (BID) | SUBCUTANEOUS | Status: DC
  Administered 2015-12-07 – 2015-12-09 (×3): 10 [IU] via SUBCUTANEOUS
  Filled 2015-12-06 (×8): qty 0.1

## 2015-12-06 MED ORDER — ENOXAPARIN SODIUM 30 MG/0.3ML ~~LOC~~ SOLN: 30.0000 mg | SUBCUTANEOUS | Status: DC

## 2015-12-06 MED ORDER — MYCOPHENOLATE SODIUM 180 MG PO TBEC
540.0000 mg | DELAYED_RELEASE_TABLET | Freq: Every day | ORAL | Status: DC
  Administered 2015-12-07: 540 mg via ORAL
  Filled 2015-12-06: qty 3

## 2015-12-06 MED ORDER — ENSURE ENLIVE PO LIQD
237.0000 mL | Freq: Two times a day (BID) | ORAL | Status: DC
  Administered 2015-12-07 – 2015-12-09 (×3): 237 mL via ORAL

## 2015-12-06 MED ORDER — SODIUM CHLORIDE 0.9 % IV SOLN
Freq: Once | INTRAVENOUS | Status: AC
  Administered 2015-12-06: 20:00:00 via INTRAVENOUS

## 2015-12-06 MED ORDER — AMLODIPINE BESYLATE 10 MG PO TABS
10.0000 mg | ORAL_TABLET | Freq: Every day | ORAL | Status: DC
  Administered 2015-12-07 – 2015-12-10 (×5): 10 mg via ORAL
  Filled 2015-12-06 (×5): qty 1

## 2015-12-06 MED ORDER — FERROUS SULFATE 325 (65 FE) MG PO TABS
325.0000 mg | ORAL_TABLET | Freq: Three times a day (TID) | ORAL | Status: DC
  Administered 2015-12-07 (×2): 325 mg via ORAL
  Filled 2015-12-06 (×2): qty 1

## 2015-12-06 MED ORDER — INSULIN ASPART 100 UNIT/ML ~~LOC~~ SOLN
0.0000 [IU] | Freq: Three times a day (TID) | SUBCUTANEOUS | Status: DC
  Administered 2015-12-08: 3 [IU] via SUBCUTANEOUS
  Administered 2015-12-09: 8 [IU] via SUBCUTANEOUS
  Administered 2015-12-10: 3 [IU] via SUBCUTANEOUS
  Administered 2015-12-11 (×2): 2 [IU] via SUBCUTANEOUS

## 2015-12-06 MED ORDER — ACETAMINOPHEN 325 MG PO TABS: 650.0000 mg | ORAL_TABLET | Freq: Four times a day (QID) | ORAL | Status: DC | PRN

## 2015-12-06 MED ORDER — PANTOPRAZOLE SODIUM 40 MG PO TBEC
40.0000 mg | DELAYED_RELEASE_TABLET | Freq: Every day | ORAL | Status: DC
  Administered 2015-12-07 – 2015-12-10 (×5): 40 mg via ORAL
  Filled 2015-12-06 (×5): qty 1

## 2015-12-06 MED ORDER — METHOCARBAMOL 500 MG PO TABS
500.0000 mg | ORAL_TABLET | Freq: Three times a day (TID) | ORAL | Status: DC | PRN
  Administered 2015-12-07: 500 mg via ORAL
  Filled 2015-12-06: qty 1

## 2015-12-06 MED ORDER — TACROLIMUS 1 MG PO CAPS: 1.0000 mg | ORAL_CAPSULE | Freq: Two times a day (BID) | ORAL | Status: DC

## 2015-12-06 MED ORDER — INSULIN ASPART 100 UNIT/ML ~~LOC~~ SOLN: 0.0000 [IU] | Freq: Every day | SUBCUTANEOUS | Status: DC

## 2015-12-06 MED ORDER — ASPIRIN EC 81 MG PO TBEC
81.0000 mg | DELAYED_RELEASE_TABLET | Freq: Every day | ORAL | Status: DC
  Administered 2015-12-07 – 2015-12-11 (×4): 81 mg via ORAL
  Filled 2015-12-06 (×4): qty 1

## 2015-12-06 MED ORDER — MYCOPHENOLATE SODIUM 180 MG PO TBEC
360.0000 mg | DELAYED_RELEASE_TABLET | Freq: Every day | ORAL | Status: DC
  Administered 2015-12-07: 360 mg via ORAL
  Filled 2015-12-06: qty 2

## 2015-12-06 MED ORDER — CLONIDINE HCL 0.2 MG/24HR TD PTWK
0.2000 mg | MEDICATED_PATCH | TRANSDERMAL | Status: DC
  Filled 2015-12-06: qty 1

## 2015-12-06 MED ORDER — PRAVASTATIN SODIUM 20 MG PO TABS
20.0000 mg | ORAL_TABLET | Freq: Every day | ORAL | Status: DC
  Administered 2015-12-07: 20 mg via ORAL
  Filled 2015-12-06: qty 1

## 2015-12-06 MED ORDER — TACROLIMUS 1 MG PO CAPS
1.5000 mg | ORAL_CAPSULE | Freq: Two times a day (BID) | ORAL | Status: DC
  Administered 2015-12-07: 1.5 mg via ORAL
  Filled 2015-12-06 (×2): qty 1

## 2015-12-06 MED ORDER — SODIUM CHLORIDE 0.9 % IV SOLN
INTRAVENOUS | Status: DC
  Administered 2015-12-06: 19:00:00 via INTRAVENOUS

## 2015-12-06 MED ORDER — LABETALOL HCL 300 MG PO TABS
300.0000 mg | ORAL_TABLET | Freq: Two times a day (BID) | ORAL | Status: DC
  Administered 2015-12-07 – 2015-12-11 (×10): 300 mg via ORAL
  Filled 2015-12-06 (×10): qty 1

## 2015-12-06 MED ORDER — DEXTROSE 5 % IV SOLN
1.0000 g | INTRAVENOUS | Status: DC
  Administered 2015-12-07 – 2015-12-10 (×5): 1 g via INTRAVENOUS
  Filled 2015-12-06 (×6): qty 10

## 2015-12-06 NOTE — Telephone Encounter (Signed)
EDCM received call from rep in office stating pt needs SNF placement.  Pt discharged home with home health after surgery and has become incontinent and family is unable to care for her.  EDCM suggested MD/family contact [facility] to add SW services so that pt can be placed from home.

## 2015-12-06 NOTE — ED Notes (Signed)
Attempted report 

## 2015-12-06 NOTE — ED Notes (Signed)
Per [facility], pt from home. Pt had R leg BKA on the 14th, released yesterday. Everything has been good, when she got home, c/o generalized weakness, altered, abnormal behavior, incontinence per family. o2 sats room air 87% per fire, pt placed on [place], Sats 97%. VSS. HR 106, BP 130/60. Pt is AAOX4. Denies pain medicine. Pt weak, poor appetite. Pt taken prescribed pain medicine. CBG 117.

## 2015-12-06 NOTE — ED Notes (Signed)
Dr. Tomi Bamberger notified of pt hemoglobin

## 2015-12-06 NOTE — ED Notes (Signed)
Tech at [place] with Pt.

## 2015-12-06 NOTE — ED Notes (Signed)
Pt returned from radiology.

## 2015-12-06 NOTE — ED Notes (Signed)
Critical hemoglobin 6.8 from lab

## 2015-12-06 NOTE — ED Provider Notes (Signed)
CSN: [id number]     Arrival date & time 12/06/15  39 History   First MD Initiated Contact with Patient 12/06/15 1813     Chief Complaint  Patient presents with  . Fatigue   HPI Pt was dsicharged from the hospital yesterday after having surgery on June 14 th(right BKA) .  Pt was doing fine in the hospital.  She says that when she went home yesterday she forgot how to do everything.  She was having trouble dressing herself, going to the bathroom.  She was taking oxycontin and a muscle relaxant for pain.  No headaches, no vomiting or diarrhea.  No fevers or chills. Family called ems.  EMS noted oxygen sat at 87%. Past Medical History  Diagnosis Date  . Retinopathy   . Metabolic bone disease   . Anemia   . CHF (congestive heart failure) ([place])   . History of blood transfusion     related to "kidney transplant"  . Hypertension     sees Dr. Carolin Guernsey  . High cholesterol   . ESRD (end stage renal disease) on dialysis ([place]) 09/28/2011-01/23/2013    M-W-F; [place]  . Pneumonia 09/27/2011  . Type II diabetes mellitus ([place])     "controlled with diet"  . Critical ischemia of lower extremity hospitalized 10/05/2015    right  . Peripheral vascular disease ([place])   . Diabetic neuropathy ([place])   . GERD (gastroesophageal reflux disease)    Past Surgical History  Procedure Laterality Date  . Insertion of dialysis catheter  10/04/2011    Procedure: INSERTION OF DIALYSIS CATHETER;  Surgeon: Angelia Mould, MD;  Location: [place];  Service: Vascular;  Laterality: Right;  insertion of dialysis catheter right internal jugular  . Av fistula placement  10/04/2011    Procedure: INSERTION OF ARTERIOVENOUS (AV) GORE-TEX GRAFT ARM;  Surgeon: Angelia Mould, MD;  Location: [place];  Service: Vascular;  Laterality: Left;  Insertion left upper arm Arteriovenous goretex graft  . Avgg removal  10/04/2011    Procedure: REMOVAL OF ARTERIOVENOUS GORETEX GRAFT ([place]);  Surgeon: Elam Dutch, MD;   Location: [place];  Service: Vascular;  Laterality: Left;  . Av fistula placement  10/29/2011    Procedure: ARTERIOVENOUS (AV) FISTULA CREATION;  Surgeon: Angelia Mould, MD;  Location: [facility] OR;  Service: Vascular;  Laterality: Right;  Creation Right Arteriovenous Fistula   . Revison of arteriovenous fistula  06/23/2012    Procedure: REVISON OF ARTERIOVENOUS FISTULA;  Surgeon: Angelia Mould, MD;  Location: [place];  Service: Vascular;  Laterality: Right;  Ultrasound guided  . Insertion of dialysis catheter  06/23/2012    Procedure: INSERTION OF DIALYSIS CATHETER;  Surgeon: Angelia Mould, MD;  Location: [place];  Service: Vascular;  Laterality: N/A;  Ultrasound guided  . Patch angioplasty  06/23/2012    Procedure: PATCH ANGIOPLASTY;  Surgeon: Angelia Mould, MD;  Location: [facility] OR;  Service: Vascular;  Laterality: Right;  . Esophagogastroduodenoscopy N/A 12/24/2012    Procedure: ESOPHAGOGASTRODUODENOSCOPY (EGD);  Surgeon: Milus Banister, MD;  Location: [place];  Service: Endoscopy;  Laterality: N/A;  . Kidney transplant  01/24/2013  . Finger amputation Right 02/26/2013    "3rd finger"  . Shuntogram N/A 04/06/2012    Procedure: Earney Mallet;  Surgeon: Angelia Mould, MD;  [facility] C[facility] LAB;  Service: Cardiovascular;  Laterality: N/A;  . Peripheral vascular catheterization N/A 09/19/2015    Procedure: Lower Extremity Angiography;  Surgeon: Adrian Prows, MD;  Location:  [place] INVASIVE CV LAB;  Service: Cardiovascular;  Laterality: N/A;  . Peripheral vascular catheterization N/A 09/19/2015    Procedure: Abdominal Aortogram;  Surgeon: Adrian Prows, MD;  Location: [place] CV LAB;  Service: Cardiovascular;  Laterality: N/A;  . Peripheral vascular catheterization Right 09/19/2015    Procedure: Peripheral Vascular Intervention;  Surgeon: Adrian Prows, MD;  Location: [place] CV LAB;  Service: Cardiovascular;  Laterality: Right;  Right Common  Iliac  . Peripheral vascular catheterization N/A  10/06/2015    Procedure: Lower Extremity Angiography;  Surgeon: Adrian Prows, MD;  Location: [place] CV LAB;  Service: Cardiovascular;  Laterality: N/A;  . Peripheral vascular catheterization  10/06/2015    Procedure: Peripheral Vascular Intervention;  Surgeon: Adrian Prows, MD;  Location: [place] CV LAB;  Service: Cardiovascular;;  REIA  Omnilink 6.0x29, innova 7x80  RSFA innova 5x80  . Amputation Right 10/13/2015    Procedure: Right Transmetatarsal Amputation;  Surgeon: Newt Minion, MD;  Location: [place];  Service: Orthopedics;  Laterality: Right;  . Amputation Right 11/29/2015    Procedure: RIGHT BELOW KNEE AMPUTATION;  Surgeon: Newt Minion, MD;  Location: [place];  Service: Orthopedics;  Laterality: Right;   Family History  Problem Relation Age of Onset  . Malignant hyperthermia Mother   . Malignant hyperthermia Father   . Anesthesia problems Neg Hx   . Thyroid disease Mother   . Diabetes Brother   . Heart disease Brother     CHF  . Hypertension Mother   . Hypertension Father    Social History  Substance Use Topics  . Smoking status: Never Smoker   . Smokeless tobacco: Never Used  . Alcohol Use: No   OB History    No data available     Review of Systems    Allergies  Review of patient's allergies indicates no known allergies.  Home Medications   Prior to Admission medications   Medication Sig Start Date End Date Taking? Authorizing Provider  acetaminophen (TYLENOL) 325 MG tablet Take 2 tablets (650 mg total) by mouth every 6 (six) hours as needed for mild pain (or Fever >/= 101). 10/18/15  Yes Maryellen Pile, MD  albuterol (PROVENTIL HFA;VENTOLIN HFA) 108 (90 BASE) MCG/ACT inhaler Inhale 1 puff into the lungs every 6 (six) hours as needed for wheezing or shortness of breath.   Yes Historical Provider, MD  amLODipine (NORVASC) 10 MG tablet Take 10 mg by mouth at bedtime.  11/14/11  Yes Historical Provider, MD  aspirin 81 MG EC tablet Take 1 tablet (81 mg total) by mouth  daily. 10/11/15  Yes Adrian Prows, MD  cilostazol (PLETAL) 100 MG tablet Take 100 mg by mouth 2 (two) times daily.   Yes Historical Provider, MD  cloNIDine (CATAPRES - DOSED IN MG/24 HR) 0.2 mg/24hr patch Place 0.2 mg onto the skin every Friday.   Yes Historical Provider, MD  enoxaparin (LOVENOX) 40 MG/0.4ML injection Inject 0.4 mLs (40 mg total) into the skin daily. 10/18/15  Yes Newt Minion, MD  ferrous sulfate 325 (65 FE) MG tablet Take 1 tablet (325 mg total) by mouth 3 (three) times daily with meals. 10/11/15  Yes Adrian Prows, MD  insulin aspart (NOVOLOG) 100 UNIT/ML injection Inject 4-14 Units into the skin 3 (three) times daily before meals. Per sliding scale: 100-150, 4 units. 151-200, 6 units. 201-250, 8 units. 251-300, 10 units. 301-350, 12 units. Over 350, 14 units.   Yes Historical Provider, MD  insulin detemir (LEVEMIR) 100 UNIT/ML injection  Inject 14-22 Units into the skin See admin instructions. Take 14 units in the morning and 22 units at bedtime.   Yes Historical Provider, MD  labetalol (NORMODYNE) 300 MG tablet Take 300 mg by mouth 2 (two) times daily. 11/06/15  Yes Historical Provider, MD  methocarbamol (ROBAXIN) 500 MG tablet Take 1 tablet (500 mg total) by mouth every 8 (eight) hours as needed for muscle spasms. 12/05/15  Yes Newt Minion, MD  mycophenolate (MYFORTIC) 180 MG EC tablet Take 360-540 mg by mouth 2 (two) times daily. Take three capsules (540mg ) in the morning and two capsules (360mg ) at night.   Yes Historical Provider, MD  nitroGLYCERIN (NITRODUR - DOSED IN MG/24 HR) 0.2 mg/hr patch Place 1 patch (0.2 mg total) onto the skin daily. 10/09/15  Yes Newt Minion, MD  oxyCODONE-acetaminophen (ROXICET) 5-325 MG tablet Take 1 tablet by mouth every 4 (four) hours as needed for severe pain. 12/01/15  Yes Newt Minion, MD  pantoprazole (PROTONIX) 40 MG tablet Take 40 mg by mouth at bedtime.    Yes Historical Provider, MD  pravastatin (PRAVACHOL) 20 MG tablet Take 20 mg by mouth at  bedtime.  09/11/12  Yes Historical Provider, MD  senna-docusate (SENOKOT-S) 8.6-50 MG tablet Take 1 tablet by mouth 2 (two) times daily. 10/18/15  Yes Maryellen Pile, MD  sulfamethoxazole-trimethoprim (BACTRIM,SEPTRA) 400-80 MG per tablet Take 1 tablet by mouth every Monday, Wednesday, and Friday.    Yes Amber Reeves-Daniel, DO  tacrolimus (PROGRAF) 0.5 MG capsule Take 0.5 mg by mouth 2 (two) times daily. Take with 1mg  capsule to make a total of 1.5mg .   Yes Historical Provider, MD  tacrolimus (PROGRAF) 1 MG capsule Take 1 mg by mouth 2 (two) times daily. Take with 0.5mg  capsule to make a total of 1.5mg .   Yes Historical Provider, MD  Vitamin D, Ergocalciferol, (DRISDOL) 50000 units CAPS capsule Take 50,000 Units by mouth every Sunday.    Yes Historical Provider, MD  oxyCODONE-acetaminophen (ROXICET) 5-325 MG tablet Take 1 tablet by mouth every 4 (four) hours as needed for severe pain. 12/05/15   Newt Minion, MD   BP 137/62 mmHg  Pulse 103  Temp(Src) 98.5 F (36.9 C) (Oral)  Resp 14  SpO2 97%  LMP 09/05/2015 Physical Exam  Constitutional: She appears well-developed and well-nourished. No distress.  HENT:  Head: Normocephalic and atraumatic.  Right Ear: External ear normal.  Left Ear: External ear normal.  Eyes: Conjunctivae are normal. Right eye exhibits no discharge. Left eye exhibits no discharge. No scleral icterus.  Neck: Neck supple. No tracheal deviation present.  Cardiovascular: Normal rate, regular rhythm and intact distal pulses.   Pulmonary/Chest: Effort normal and breath sounds normal. No stridor. No respiratory distress. She has no wheezes. She has no rales.  Abdominal: Soft. Bowel sounds are normal. She exhibits no distension. There is no tenderness. There is no rebound and no guarding.  Musculoskeletal: She exhibits no edema or tenderness.  Neurological: She is alert. She has normal strength. No cranial nerve deficit (no facial droop, extraocular movements intact, no slurred  speech) or sensory deficit. She exhibits normal muscle tone. She displays no seizure activity. Coordination normal.  Skin: Skin is warm and dry. No rash noted.  Psychiatric: She has a normal mood and affect.  Nursing note and vitals reviewed.   ED Course  Procedures (including critical care time) Labs Review Labs Reviewed  COMPREHENSIVE METABOLIC PANEL - Abnormal; Notable for the following:    Sodium 134 (*)  Chloride 99 (*)    Glucose, Bld 137 (*)    BUN 43 (*)    Creatinine, Ser 1.73 (*)    Calcium 8.8 (*)    Total Protein 5.6 (*)    Albumin 2.2 (*)    AST 88 (*)    ALT 113 (*)    Alkaline Phosphatase 793 (*)    GFR calc non Af Amer 34 (*)    GFR calc Af Amer 40 (*)    All other components within normal limits  CBC WITH DIFFERENTIAL/PLATELET - Abnormal; Notable for the following:    RBC 2.99 (*)    Hemoglobin 6.8 (*)    HCT 22.5 (*)    MCV 75.3 (*)    MCH 22.7 (*)    RDW 17.9 (*)    All other components within normal limits  PROTIME-INR - Abnormal; Notable for the following:    Prothrombin Time 18.2 (*)    INR 1.50 (*)    All other components within normal limits  URINALYSIS, ROUTINE W REFLEX MICROSCOPIC (NOT AT [facility]) - Abnormal; Notable for the following:    Color, Urine RED (*)    APPearance CLOUDY (*)    Hgb urine dipstick LARGE (*)    Bilirubin Urine LARGE (*)    Ketones, ur 15 (*)    Leukocytes, UA SMALL (*)    All other components within normal limits  BRAIN NATRIURETIC PEPTIDE - Abnormal; Notable for the following:    B Natriuretic Peptide 221.9 (*)    All other components within normal limits  URINE MICROSCOPIC-ADD ON - Abnormal; Notable for the following:    Squamous Epithelial / LPF 0-5 (*)    Bacteria, UA MANY (*)    Casts HYALINE CASTS (*)    All other components within normal limits  URINE CULTURE  APTT  AMMONIA  I-STAT TROPOININ, ED  TYPE AND SCREEN  PREPARE RBC (CROSSMATCH)    Imaging Review Dg Chest 2 View  12/06/2015  CLINICAL DATA:   Fatigue, weakness, poor appetite. Decreased O2 sats. EXAM: CHEST  2 VIEW COMPARISON:  10/14/2015 FINDINGS: Cardiomegaly. No overt edema. Lingular and right basilar atelectasis. No effusions. No acute bony abnormality. IMPRESSION: Bibasilar atelectasis.  Cardiomegaly. Electronically Signed   By: Rolm Baptise M.D.   On: 12/06/2015 19:40   Ct Head Wo Contrast  12/06/2015  CLINICAL DATA:  Per [facility], pt from home. Pt had R leg BKA on the 14th, released yesterday. Everything has been good, when she got home, c/o generalized weakness, altered, abnormal behavior, incontinence per family. o2 sats room air 87% per fire, pt placed on [place], Sats 97%. VSS. HR 106, BP 130/60. Pt is AAOX4. Denies pain medicine. Pt weak, poor appetite. Pt taken prescribed pain medicine. EXAM: CT HEAD WITHOUT CONTRAST TECHNIQUE: Contiguous axial images were obtained from the base of the skull through the vertex without intravenous contrast. COMPARISON:  03/29/2011 FINDINGS: The ventricles are normal in size and configuration. There are no parenchymal masses or mass effect, no evidence of an infarct, no extra-axial masses or abnormal fluid collections and no intracranial hemorrhage. There is a small amount of dependent fluid in the left sphenoid sinus. There is mild sinus mucosal thickening involving all sinuses. The mastoid air cells are mostly opacified bilaterally. No skull lesion. IMPRESSION: 1. No intracranial abnormality. 2. Sinus mucosal thickening, mild. Bilateral mastoid air cell opacification, likely effusions. Electronically Signed   By: Lajean Manes M.D.   On: 12/06/2015 19:21   I have personally reviewed  and evaluated these images and lab results as part of my medical decision-making.   EKG Interpretation   Date/Time:  Wednesday December 06 2015 18:37:35 EDT Ventricular Rate:  105 PR Interval:    QRS Duration: 79 QT Interval:  323 QTC Calculation: 427 R Axis:   19 Text Interpretation:  Sinus tachycardia Left atrial  enlargement Borderline  T wave abnormalities No significant change since last tracing Confirmed by  Lanetra Hartley  MD-J, Maleeka Sabatino [id number]) on 12/06/2015 6:49:16 PM      MDM   Final diagnoses:  Anemia associated with acute blood loss  Chronic renal failure, unspecified stage    Pt presents to the ED with weakness and fatigue.  Recently discharged after BKA surgery.  Labs notable for worsening anemia.  UA is abnormal but she is denying sx currently.  Will send a culture. Blood transfusion ordered for her anemia.  Discussed with Dr Ninfa Linden.  He spoke with Dr Sharol Given.  They request medical admission considering her complex medical history.   Family also mentions that she may need nursing home placement and they have plans to get her placed tomorrow.  I consulted social work.  Pt's lfts are elevated. She does have some ttp in the epigastric region on repeat exam.  Will add on [place] of abdomen.   Dorie Rank, MD 12/06/15 2018

## 2015-12-06 NOTE — H&P (Addendum)
History and Physical  Patient Name: [name]     IFO:[id number]    DOB: [birth year]    DOA: 12/06/2015 PCP: Benito Mccreedy, MD   Patient coming from: Home  Chief Complaint: Weakness  HPI: [name] is a 45 y.o. female with a past medical history significant for ESRD s/p renal transplant, IDDM, PVD s/p recent BKA for gangrene, and anemia of CKD who presents with weakness.  The patient was admitted 6/14-6/20 for amputation of gangrene of the right hindfoot and underwent RIGHT BKA on 6/14.  The hospital stay was uncomplicated (creatinine 0.9 preoperatively, not checked afterwards), she had some hypoxia during that hospitalization with a low-probability V-Q scan, and was discharged without oxygen yesterday to home.  Today, she was at home, but was becoming more weak and requiring full assist for walking, transfers, feeding and dressing.  She describes to me being successful with PT and OT in the hospital, but getting home yesterday and suddenly feeling "like everything left my brain," forgetful and tired.  She had emesis and family found she had bowel incontinence in bed several times, so her family brought her back to the ER.  She had no fever, chills, cough, dysuria, urinary urgency, drainage from her wound.  ED course: -Low grade temp, tachycardic, BP normal, saturating 93% on 2L [place] -Na 134, K 4.4, Cr 1.73 (baseline 0.9), WBC 4K, Hgb 6.8 (pre-operative 8.7 and baseline between 7.5 and upper 8s), BNP 200s -ABG with normal pH, pCO2 41, pO2 53 -UA showed pyuria and bacteriuria, CXR without focal opacity, but with some bilateral opacities, atelectasis versus congestion -She was started transfusion of 1 unit PRBCs and TRH were asked to evaluate for admission     Review of Systems:  Pt complains of tired, memory changes, weakness global, crampy epigastric pain, soiling self in bed, emesis, dry voice, hiccups. All other systems negative except as just noted or noted in the history of  present illness.    Past Medical History  Diagnosis Date  . Retinopathy   . Metabolic bone disease   . Anemia   . CHF (congestive heart failure) ([place])   . History of blood transfusion     related to "kidney transplant"  . Hypertension     sees Dr. Carolin Guernsey  . High cholesterol   . ESRD (end stage renal disease) on dialysis ([place]) 09/28/2011-01/23/2013    M-W-F; [facility]  . Pneumonia 09/27/2011  . Type II diabetes mellitus ([place])     "controlled with diet"  . Critical ischemia of lower extremity hospitalized 10/05/2015    right  . Peripheral vascular disease ([place])   . Diabetic neuropathy ([place])   . GERD (gastroesophageal reflux disease)     Past Surgical History  Procedure Laterality Date  . Insertion of dialysis catheter  10/04/2011    Procedure: INSERTION OF DIALYSIS CATHETER;  Surgeon: Angelia Mould, MD;  Location: [place];  Service: Vascular;  Laterality: Right;  insertion of dialysis catheter right internal jugular  . Av fistula placement  10/04/2011    Procedure: INSERTION OF ARTERIOVENOUS (AV) GORE-TEX GRAFT ARM;  Surgeon: Angelia Mould, MD;  Location: [place];  Service: Vascular;  Laterality: Left;  Insertion left upper arm Arteriovenous goretex graft  . Avgg removal  10/04/2011    Procedure: REMOVAL OF ARTERIOVENOUS GORETEX GRAFT ([place]);  Surgeon: Elam Dutch, MD;  Location: [place];  Service: Vascular;  Laterality: Left;  . Av fistula placement  10/29/2011    Procedure: ARTERIOVENOUS (  AV) FISTULA CREATION;  Surgeon: Angelia Mould, MD;  Location: [facility] OR;  Service: Vascular;  Laterality: Right;  Creation Right Arteriovenous Fistula   . Revison of arteriovenous fistula  06/23/2012    Procedure: REVISON OF ARTERIOVENOUS FISTULA;  Surgeon: Angelia Mould, MD;  Location: [place];  Service: Vascular;  Laterality: Right;  Ultrasound guided  . Insertion of dialysis catheter  06/23/2012    Procedure: INSERTION OF DIALYSIS CATHETER;  Surgeon: Angelia Mould, MD;  Location: [place];  Service: Vascular;  Laterality: N/A;  Ultrasound guided  . Patch angioplasty  06/23/2012    Procedure: PATCH ANGIOPLASTY;  Surgeon: Angelia Mould, MD;  [facility];  Service: Vascular;  Laterality: Right;  . Esophagogastroduodenoscopy N/A 12/24/2012    Procedure: ESOPHAGOGASTRODUODENOSCOPY (EGD);  Surgeon: Milus Banister, MD;  Location: [place];  Service: Endoscopy;  Laterality: N/A;  . Kidney transplant  01/24/2013  . Finger amputation Right 02/26/2013    "3rd finger"  . Shuntogram N/A 04/06/2012    Procedure: Earney Mallet;  Surgeon: Angelia Mould, MD;  Location: [facility] CATH LAB;  Service: Cardiovascular;  Laterality: N/A;  . Peripheral vascular catheterization N/A 09/19/2015    Procedure: Lower Extremity Angiography;  Surgeon: Adrian Prows, MD;  Location: [place] CV LAB;  Service: Cardiovascular;  Laterality: N/A;  . Peripheral vascular catheterization N/A 09/19/2015    Procedure: Abdominal Aortogram;  Surgeon: Adrian Prows, MD;  Location: [place] CV LAB;  Service: Cardiovascular;  Laterality: N/A;  . Peripheral vascular catheterization Right 09/19/2015    Procedure: Peripheral Vascular Intervention;  Surgeon: Adrian Prows, MD;  Location: [place] CV LAB;  Service: Cardiovascular;  Laterality: Right;  Right Common  Iliac  . Peripheral vascular catheterization N/A 10/06/2015    Procedure: Lower Extremity Angiography;  Surgeon: Adrian Prows, MD;  Location: [place] CV LAB;  Service: Cardiovascular;  Laterality: N/A;  . Peripheral vascular catheterization  10/06/2015    Procedure: Peripheral Vascular Intervention;  Surgeon: Adrian Prows, MD;  Location: [place] CV LAB;  Service: Cardiovascular;;  REIA  Omnilink 6.0x29, innova 7x80  RSFA innova 5x80  . Amputation Right 10/13/2015    Procedure: Right Transmetatarsal Amputation;  Surgeon: Newt Minion, MD;  Location: [place];  Service: Orthopedics;  Laterality: Right;  . Amputation Right 11/29/2015     Procedure: RIGHT BELOW KNEE AMPUTATION;  Surgeon: Newt Minion, MD;  Location: [place];  Service: Orthopedics;  Laterality: Right;    Social History: Patient lives with her family.  The patient walks unassisted before her amputation, working with PT and walking since surgery.  Not a smoker.  In school.    No Known Allergies  Family history: family history includes Diabetes in her brother; Heart disease in her brother; Hypertension in her father and mother; Malignant hyperthermia in her father and mother; Thyroid disease in her mother. There is no history of Anesthesia problems.  Prior to Admission medications   Medication Sig Start Date End Date Taking? Authorizing Provider  acetaminophen (TYLENOL) 325 MG tablet Take 2 tablets (650 mg total) by mouth every 6 (six) hours as needed for mild pain (or Fever >/= 101). 10/18/15  Yes Maryellen Pile, MD  albuterol (PROVENTIL HFA;VENTOLIN HFA) 108 (90 BASE) MCG/ACT inhaler Inhale 1 puff into the lungs every 6 (six) hours as needed for wheezing or shortness of breath.   Yes Historical Provider, MD  amLODipine (NORVASC) 10 MG tablet Take 10 mg by mouth at bedtime.  11/14/11  Yes Historical Provider,  MD  aspirin 81 MG EC tablet Take 1 tablet (81 mg total) by mouth daily. 10/11/15  Yes Adrian Prows, MD  cilostazol (PLETAL) 100 MG tablet Take 100 mg by mouth 2 (two) times daily.   Yes Historical Provider, MD  cloNIDine (CATAPRES - DOSED IN MG/24 HR) 0.2 mg/24hr patch Place 0.2 mg onto the skin every Friday.   Yes Historical Provider, MD  enoxaparin (LOVENOX) 40 MG/0.4ML injection Inject 0.4 mLs (40 mg total) into the skin daily. 10/18/15  Yes Newt Minion, MD  ferrous sulfate 325 (65 FE) MG tablet Take 1 tablet (325 mg total) by mouth 3 (three) times daily with meals. 10/11/15  Yes Adrian Prows, MD  insulin aspart (NOVOLOG) 100 UNIT/ML injection Inject 4-14 Units into the skin 3 (three) times daily before meals. Per sliding scale: 100-150, 4 units. 151-200, 6 units.  201-250, 8 units. 251-300, 10 units. 301-350, 12 units. Over 350, 14 units.   Yes Historical Provider, MD  insulin detemir (LEVEMIR) 100 UNIT/ML injection Inject 14-22 Units into the skin See admin instructions. Take 14 units in the morning and 22 units at bedtime.   Yes Historical Provider, MD  labetalol (NORMODYNE) 300 MG tablet Take 300 mg by mouth 2 (two) times daily. 11/06/15  Yes Historical Provider, MD  methocarbamol (ROBAXIN) 500 MG tablet Take 1 tablet (500 mg total) by mouth every 8 (eight) hours as needed for muscle spasms. 12/05/15  Yes Newt Minion, MD  mycophenolate (MYFORTIC) 180 MG EC tablet Take 360-540 mg by mouth 2 (two) times daily. Take three capsules (525m) in the morning and two capsules (3625m at night.   Yes Historical Provider, MD  nitroGLYCERIN (NITRODUR - DOSED IN MG/24 HR) 0.2 mg/hr patch Place 1 patch (0.2 mg total) onto the skin daily. 10/09/15  Yes MaNewt MinionMD  oxyCODONE-acetaminophen (ROXICET) 5-325 MG tablet Take 1 tablet by mouth every 4 (four) hours as needed for severe pain. 12/01/15  Yes MaNewt MinionMD  pantoprazole (PROTONIX) 40 MG tablet Take 40 mg by mouth at bedtime.    Yes Historical Provider, MD  pravastatin (PRAVACHOL) 20 MG tablet Take 20 mg by mouth at bedtime.  09/11/12  Yes Historical Provider, MD  senna-docusate (SENOKOT-S) 8.6-50 MG tablet Take 1 tablet by mouth 2 (two) times daily. 10/18/15  Yes NaMaryellen PileMD  sulfamethoxazole-trimethoprim (BACTRIM,SEPTRA) 400-80 MG per tablet Take 1 tablet by mouth every Monday, Wednesday, and Friday.    Yes Amber Reeves-Daniel, DO  tacrolimus (PROGRAF) 0.5 MG capsule Take 0.5 mg by mouth 2 (two) times daily. Take with 28m60mapsule to make a total of 1.5mg72m Yes Historical Provider, MD  tacrolimus (PROGRAF) 1 MG capsule Take 1 mg by mouth 2 (two) times daily. Take with 0.5mg 7msule to make a total of 1.5mg. 34mes Historical Provider, MD  Vitamin D, Ergocalciferol, (DRISDOL) 50000 units CAPS capsule Take  50,000 Units by mouth every Sunday.    Yes Historical Provider, MD  oxyCODONE-acetaminophen (ROXICET) 5-325 MG tablet Take 1 tablet by mouth every 4 (four) hours as needed for severe pain. 12/05/15   MarcusNewt Minion     Physical Exam: BP 132/58 mmHg  Pulse 103  Temp(Src) 99.1 F (37.3 C) (Oral)  Resp 15  SpO2 95%  LMP 09/05/2015 General appearance: Small statured adult female, appears tired, responsive to voice, but closes eyes again and goes back to sleep.   Eyes: Anicteric, conjunctiva pink, lids and lashes normal.  ENT: No nasal deformity, discharge, or epistaxis.  OP moist without lesions.   Skin: Warm and dry.  No jaundice.  No suspicious rashes or lesions. Cardiac: RRR, nl S1-S2, gallop present, no other murmurs.  Capillary refill is brisk.  JVP normal.  No LE edema.  Radial pulses 2+ and symmetric. Respiratory: Normal respiratory rate and rhythm.  CTAB without rales or wheezes. Abdomen: Abdomen soft without rigidity.  Mild nonfocal TTP and guarding, mostly suprapubic. No ascites, distension.   MSK: No deformities or effusions. Neuro: Sleepy and somnolent, rouses to voice then goes back to sleep.  Oriented to person, place, date and situation.  Speech is fluent but hoarse.  Moves arms and leg equally but 4+/5 strength, and struggles to sit up.    Psych: Behavior appropriate.  Affect flat.  No evidence of aural or visual hallucinations or delusions.       Labs on Admission:  I have personally reviewed following labs and imaging studies: CBC:  Recent Labs Lab 12/06/15 1838  WBC 4.0  NEUTROABS 2.5  HGB 6.8*  HCT 22.5*  MCV 75.3*  PLT 315   Basic Metabolic Panel:  Recent Labs Lab 12/06/15 1838  NA 134*  K 4.4  CL 99*  CO2 25  GLUCOSE 137*  BUN 43*  CREATININE 1.73*  CALCIUM 8.8*   GFR: Estimated Creatinine Clearance: 32.7 mL/min (by C-G formula based on Cr of 1.73). Liver Function Tests:  Recent Labs Lab 12/06/15 1838  AST 88*  ALT 113*  ALKPHOS  793*  BILITOT 1.1  PROT 5.6*  ALBUMIN 2.2*   No results for input(s): LIPASE, AMYLASE in the last 168 hours.  Recent Labs Lab 12/06/15 1838  AMMONIA 21   Coagulation Profile:  Recent Labs Lab 12/06/15 1838  INR 1.50*   CBG:  Recent Labs Lab 12/04/15 0704 12/04/15 1116 12/04/15 1632 12/04/15 2139 12/05/15 0639  GLUCAP 176* 102* 140* 106* 99         Radiological Exams on Admission: Personally reviewed: Dg Chest 2 View  12/06/2015  CLINICAL DATA:  Fatigue, weakness, poor appetite. Decreased O2 sats. EXAM: CHEST  2 VIEW COMPARISON:  10/14/2015 FINDINGS: Cardiomegaly. No overt edema. Lingular and right basilar atelectasis. No effusions. No acute bony abnormality. IMPRESSION: Bibasilar atelectasis.  Cardiomegaly. Electronically Signed   By: Rolm Baptise M.D.   On: 12/06/2015 19:40   Ct Head Wo Contrast  12/06/2015  CLINICAL DATA:  Per [facility], pt from home. Pt had R leg BKA on the 14th, released yesterday. Everything has been good, when she got home, c/o generalized weakness, altered, abnormal behavior, incontinence per family. o2 sats room air 87% per fire, pt placed on [place], Sats 97%. VSS. HR 106, BP 130/60. Pt is AAOX4. Denies pain medicine. Pt weak, poor appetite. Pt taken prescribed pain medicine. EXAM: CT HEAD WITHOUT CONTRAST TECHNIQUE: Contiguous axial images were obtained from the base of the skull through the vertex without intravenous contrast. COMPARISON:  03/29/2011 FINDINGS: The ventricles are normal in size and configuration. There are no parenchymal masses or mass effect, no evidence of an infarct, no extra-axial masses or abnormal fluid collections and no intracranial hemorrhage. There is a small amount of dependent fluid in the left sphenoid sinus. There is mild sinus mucosal thickening involving all sinuses. The mastoid air cells are mostly opacified bilaterally. No skull lesion. IMPRESSION: 1. No intracranial abnormality. 2. Sinus mucosal thickening, mild. Bilateral  mastoid air cell opacification, likely effusions. Electronically Signed   By: Dedra Skeens.D.  On: 12/06/2015 19:21    EKG: Independently reviewed. Rate 105, QTc 427, no ST changes, sinus rhythm.   Echocardiogram April 2017: EF 65%, severe LVH, no valvular disease.     Assessment/Plan 1. Weakness and altered mental status: Differential broad, including AKI, infection (cystitis), hypoxia, tacrolimus toxicity.  ADDENDUM: RUQ US shows cholecystitis.  Suspect this is cause.  Will continue ceftriaxone and consult General Surgery. -MIVF and NPO -Consult to General Surgery -Continue ceftriaxone    2. Acute blood loss anemia on chronic anemia of CKD:  Post-operative hemoglobin 6.8 g/dL, previously 8.7.  Baseline is 7s to 8s. -Transfuse now -Post-transfusion H/H -Continue home iron  3. AKI of transplanted kidney:  Baseline Cr 1.0.  DDKT in 2014 on Prograf, MMF, and prednisone and Bactrim.  Follows with Dr. Ambrose Finland and Transplant [facility] at [facility].  At admission, no tacrolimus since night before (Tuesday) -Continue MMF and prednisone -Hold tacrolimus until tomorrow morning -Check Prograf level -Check urine electrolytes -Consult to Nephrology  4. LFT elevation:  Unclear etiology.  Alk phos elevated since March.  No recent abdomen imaging. -RUQ [place] ordered -Trend LFT  5. UTI:  Recent hospitalization and ortho surgery, pyuria and bacteriuria. -Ceftriaxone 1 g daily and follow urine culture  6. Post-operative care:  -Incentive spirometry -Dr. Sharol Given was contacted by the ED and will see, appreciate cares -May continue Robaxin and Percocet PRN  7. Hypoxia:  Unclear source.  No O2 at baseline.  Suspect atelectasis, but in setting of recent surgery and tachycardia, concern for PE.   -Obtain d-dimer, and if elevated will obtain V-Q scan.  8. IDDM:  -Continue Levemir 14 units in BID (takes 14 qAM and 22 qHS at home) -Sliding scale corrections  9. Peripheral vasrcular disease and  HTN:  -Continue amlodipine, clonidine, labetalol, aspirin and statin  10. Severe protein calorie malnutrition:  -Ensure daily  11. GERD: -Continue PPI  12. History of Leukopenia: Normal now.  Seen by Dr. Earlie Server, last in March 2016.       DVT prophylaxis: Lovenox  Code Status: FULL  Family Communication: None present  Disposition Plan: Anticipate transfusion and fluids overnight.  Trend electrolytes and repeat H/H.  Treat UTI and monitor culture.  Follow up d-dimer, consider repeat VQ scan. Consults called: Orthopedics, Nephrology Admission status: Inpatient, med surg    Medical decision making: Patient seen at 11:13 PM on 12/06/2015.  The patient was discussed with Dr. Tomi Bamberger. What exists of the patient's chart was reviewed in depth.  Clinical condition: stable.        Edwin Dada [facility] Pager [phone]     At the time of admission, it appears that the appropriate admission status for this patient is INPATIENT. This is judged to be reasonable and necessary in order to provide the required intensity of service to ensure the patient's safety given the presenting symptoms, physical exam findings, and initial radiographic and laboratory data in the context of their chronic comorbidities.  Together, these circumstances are felt to place her/him at high risk for further clinical deterioration threatening life, limb, or organ. The following factors support the admission status of inpatient:   A. The patient's presenting symptoms include confusion B. The worrisome physical exam findings include abdominal discomfort C. The initial radiographic and laboratory data are worrisome because of cholecystitis D. The chronic co-morbidities include renal transplant E. Patient requires inpatient status due to high intensity of service, high risk for further deterioration and high frequency of surveillance required. F. I certify  that at the point of admission it is my  clinical judgment that the patient will require inpatient hospital care spanning beyond 2 midnights from the point of admission.

## 2015-12-07 ENCOUNTER — Observation Stay (HOSPITAL_COMMUNITY): Payer: Medicare Other

## 2015-12-07 ENCOUNTER — Encounter (HOSPITAL_COMMUNITY): Payer: Self-pay | Admitting: *Deleted

## 2015-12-07 DIAGNOSIS — N189 Chronic kidney disease, unspecified: Secondary | ICD-10-CM

## 2015-12-07 DIAGNOSIS — E1122 Type 2 diabetes mellitus with diabetic chronic kidney disease: Secondary | ICD-10-CM | POA: Diagnosis not present

## 2015-12-07 DIAGNOSIS — I509 Heart failure, unspecified: Secondary | ICD-10-CM | POA: Diagnosis not present

## 2015-12-07 DIAGNOSIS — E1165 Type 2 diabetes mellitus with hyperglycemia: Secondary | ICD-10-CM

## 2015-12-07 DIAGNOSIS — N186 End stage renal disease: Secondary | ICD-10-CM | POA: Diagnosis not present

## 2015-12-07 DIAGNOSIS — R197 Diarrhea, unspecified: Secondary | ICD-10-CM | POA: Diagnosis not present

## 2015-12-07 DIAGNOSIS — N179 Acute kidney failure, unspecified: Secondary | ICD-10-CM

## 2015-12-07 DIAGNOSIS — Z89511 Acquired absence of right leg below knee: Secondary | ICD-10-CM | POA: Diagnosis not present

## 2015-12-07 DIAGNOSIS — K810 Acute cholecystitis: Secondary | ICD-10-CM

## 2015-12-07 DIAGNOSIS — Z940 Kidney transplant status: Secondary | ICD-10-CM

## 2015-12-07 DIAGNOSIS — E1140 Type 2 diabetes mellitus with diabetic neuropathy, unspecified: Secondary | ICD-10-CM | POA: Diagnosis not present

## 2015-12-07 DIAGNOSIS — K8000 Calculus of gallbladder with acute cholecystitis without obstruction: Secondary | ICD-10-CM | POA: Diagnosis not present

## 2015-12-07 DIAGNOSIS — D62 Acute posthemorrhagic anemia: Secondary | ICD-10-CM

## 2015-12-07 DIAGNOSIS — J9601 Acute respiratory failure with hypoxia: Secondary | ICD-10-CM

## 2015-12-07 DIAGNOSIS — E1151 Type 2 diabetes mellitus with diabetic peripheral angiopathy without gangrene: Secondary | ICD-10-CM | POA: Diagnosis not present

## 2015-12-07 DIAGNOSIS — D638 Anemia in other chronic diseases classified elsewhere: Secondary | ICD-10-CM | POA: Diagnosis not present

## 2015-12-07 DIAGNOSIS — D631 Anemia in chronic kidney disease: Secondary | ICD-10-CM | POA: Diagnosis not present

## 2015-12-07 DIAGNOSIS — I120 Hypertensive chronic kidney disease with stage 5 chronic kidney disease or end stage renal disease: Secondary | ICD-10-CM | POA: Diagnosis not present

## 2015-12-07 DIAGNOSIS — Z794 Long term (current) use of insulin: Secondary | ICD-10-CM | POA: Diagnosis not present

## 2015-12-07 DIAGNOSIS — N2581 Secondary hyperparathyroidism of renal origin: Secondary | ICD-10-CM | POA: Diagnosis not present

## 2015-12-07 DIAGNOSIS — N390 Urinary tract infection, site not specified: Secondary | ICD-10-CM

## 2015-12-07 DIAGNOSIS — I10 Essential (primary) hypertension: Secondary | ICD-10-CM

## 2015-12-07 DIAGNOSIS — I132 Hypertensive heart and chronic kidney disease with heart failure and with stage 5 chronic kidney disease, or end stage renal disease: Secondary | ICD-10-CM | POA: Diagnosis not present

## 2015-12-07 DIAGNOSIS — Z7982 Long term (current) use of aspirin: Secondary | ICD-10-CM | POA: Diagnosis not present

## 2015-12-07 DIAGNOSIS — R7989 Other specified abnormal findings of blood chemistry: Secondary | ICD-10-CM | POA: Diagnosis not present

## 2015-12-07 DIAGNOSIS — Z992 Dependence on renal dialysis: Secondary | ICD-10-CM | POA: Diagnosis not present

## 2015-12-07 DIAGNOSIS — E876 Hypokalemia: Secondary | ICD-10-CM | POA: Diagnosis not present

## 2015-12-07 DIAGNOSIS — E43 Unspecified severe protein-calorie malnutrition: Secondary | ICD-10-CM | POA: Diagnosis not present

## 2015-12-07 DIAGNOSIS — K219 Gastro-esophageal reflux disease without esophagitis: Secondary | ICD-10-CM | POA: Diagnosis not present

## 2015-12-07 LAB — COMPREHENSIVE METABOLIC PANEL
ALT: 98 U/L — ABNORMAL HIGH (ref 14–54)
AST: 62 U/L — ABNORMAL HIGH (ref 15–41)
Albumin: 2.3 g/dL — ABNORMAL LOW (ref 3.5–5.0)
Alkaline Phosphatase: 754 U/L — ABNORMAL HIGH (ref 38–126)
Anion gap: 11 (ref 5–15)
BUN: 44 mg/dL — ABNORMAL HIGH (ref 6–20)
CO2: 24 mmol/L (ref 22–32)
Calcium: 8.6 mg/dL — ABNORMAL LOW (ref 8.9–10.3)
Chloride: 98 mmol/L — ABNORMAL LOW (ref 101–111)
Creatinine, Ser: 1.6 mg/dL — ABNORMAL HIGH (ref 0.44–1.00)
GFR calc Af Amer: 44 mL/min — ABNORMAL LOW (ref >60)
GFR calc non Af Amer: 38 mL/min — ABNORMAL LOW (ref >60)
Glucose, Bld: 141 mg/dL — ABNORMAL HIGH (ref 65–99)
Potassium: 4 mmol/L (ref 3.5–5.1)
Sodium: 133 mmol/L — ABNORMAL LOW (ref 135–145)
Total Bilirubin: 1.7 mg/dL — ABNORMAL HIGH (ref 0.3–1.2)
Total Protein: 5.7 g/dL — ABNORMAL LOW (ref 6.5–8.1)

## 2015-12-07 LAB — GLUCOSE, CAPILLARY
Glucose-Capillary: 119 mg/dL — ABNORMAL HIGH (ref 65–99)
Glucose-Capillary: 75 mg/dL (ref 65–99)
Glucose-Capillary: 66 mg/dL (ref 65–99)
Glucose-Capillary: 174 mg/dL — ABNORMAL HIGH (ref 65–99)
Glucose-Capillary: 62 mg/dL — ABNORMAL LOW (ref 65–99)

## 2015-12-07 LAB — CBC
HCT: 26 % — ABNORMAL LOW (ref 36.0–46.0)
Hemoglobin: 8.3 g/dL — ABNORMAL LOW (ref 12.0–15.0)
MCH: 24.1 pg — ABNORMAL LOW (ref 26.0–34.0)
MCHC: 31.9 g/dL (ref 30.0–36.0)
MCV: 75.4 fL — ABNORMAL LOW (ref 78.0–100.0)
Platelets: 311 10*3/uL (ref 150–400)
RBC: 3.45 MIL/uL — ABNORMAL LOW (ref 3.87–5.11)
RDW: 17.8 % — ABNORMAL HIGH (ref 11.5–15.5)
WBC: 4.3 10*3/uL (ref 4.0–10.5)

## 2015-12-07 LAB — CK: Total CK: 40 U/L (ref 38–234)

## 2015-12-07 LAB — PROTEIN / CREATININE RATIO, URINE
Creatinine, Urine: 216.6 mg/dL
Protein Creatinine Ratio: 0.32 mg/mg{Cre} — ABNORMAL HIGH (ref 0.00–0.15)
Total Protein, Urine: 70 mg/dL

## 2015-12-07 LAB — IRON AND TIBC
Iron: 49 ug/dL (ref 28–170)
Saturation Ratios: 33 % — ABNORMAL HIGH (ref 10.4–31.8)
TIBC: 148 ug/dL — ABNORMAL LOW (ref 250–450)
UIBC: 99 ug/dL

## 2015-12-07 LAB — HCG, SERUM, QUALITATIVE: Preg, Serum: NEGATIVE

## 2015-12-07 LAB — LIPASE, BLOOD: Lipase: 12 U/L (ref 11–51)

## 2015-12-07 LAB — D-DIMER, QUANTITATIVE: D-Dimer, Quant: 3.78 ug/mL-FEU — ABNORMAL HIGH (ref 0.00–0.50)

## 2015-12-07 LAB — SODIUM, URINE, RANDOM: Sodium, Ur: <10 mmol/L

## 2015-12-07 LAB — CREATININE, URINE, RANDOM: Creatinine, Urine: 215.25 mg/dL

## 2015-12-07 LAB — PATHOLOGIST SMEAR REVIEW

## 2015-12-07 MED ORDER — TECHNETIUM TO 99M ALBUMIN AGGREGATED
4.2000 | Freq: Once | INTRAVENOUS | Status: AC | PRN
  Administered 2015-12-07: 4 via INTRAVENOUS

## 2015-12-07 MED ORDER — SODIUM CHLORIDE 0.9 % IV SOLN
INTRAVENOUS | Status: DC
  Administered 2015-12-07: 21:00:00 via INTRAVENOUS

## 2015-12-07 MED ORDER — MYCOPHENOLATE SODIUM 180 MG PO TBEC: 540.0000 mg | DELAYED_RELEASE_TABLET | Freq: Every morning | ORAL | Status: DC

## 2015-12-07 MED ORDER — TACROLIMUS 1 MG PO CAPS
1.5000 mg | ORAL_CAPSULE | Freq: Two times a day (BID) | ORAL | Status: DC
  Administered 2015-12-07 – 2015-12-11 (×7): 1.5 mg via ORAL
  Filled 2015-12-07 (×3): qty 1
  Filled 2015-12-07: qty 3
  Filled 2015-12-07 (×2): qty 1
  Filled 2015-12-07: qty 3
  Filled 2015-12-07 (×4): qty 1

## 2015-12-07 MED ORDER — MYCOPHENOLATE SODIUM 180 MG PO TBEC
540.0000 mg | DELAYED_RELEASE_TABLET | Freq: Every morning | ORAL | Status: DC
  Administered 2015-12-09 – 2015-12-11 (×3): 540 mg via ORAL
  Filled 2015-12-07 (×3): qty 3

## 2015-12-07 MED ORDER — MYCOPHENOLATE SODIUM 180 MG PO TBEC
360.0000 mg | DELAYED_RELEASE_TABLET | Freq: Every evening | ORAL | Status: DC
  Administered 2015-12-07 – 2015-12-10 (×4): 360 mg via ORAL
  Filled 2015-12-07 (×4): qty 2

## 2015-12-07 MED ORDER — TECHNETIUM TC 99M DIETHYLENETRIAME-PENTAACETIC ACID
32.2000 | Freq: Once | INTRAVENOUS | Status: DC | PRN
Start: 2015-12-07 — End: 2015-12-11

## 2015-12-07 MED ORDER — TACROLIMUS 1 MG PO CAPS: 1.0000 mg | ORAL_CAPSULE | Freq: Two times a day (BID) | ORAL | Status: DC

## 2015-12-07 MED ORDER — DEXTROSE 50 % IV SOLN
INTRAVENOUS | Status: AC
  Administered 2015-12-07: 50 mL
  Filled 2015-12-07: qty 50

## 2015-12-07 NOTE — Care Management Obs Status (Signed)
[place] NOTIFICATION   Patient Details  Name: [name] MRN: [id number] Date of Birth: 02-20-1971   Medicare Observation Status Notification Given:  Yes  Code 44 given and explained to pt , who stated understanding of change in status.   Kourtney Terriquez, Rory Percy, RN 12/07/2015, 3:28 PM

## 2015-12-07 NOTE — Progress Notes (Signed)
Spoke with Dr. Trinna Post of [facility], who was comfortable with patient to be admitted here at [facility], seen by her primary nephrologist Dr. Posey Pronto if needed, and surgery by Gen Surg here.

## 2015-12-07 NOTE — Progress Notes (Signed)
Hypoglycemic Event  CBG: 66  Treatment: 15 GM carbohydrate snack  Symptoms: None  Follow-up CBG: Time:2254 CBG Result:62  Possible Reasons for Event: Inadequate meal intake  Comments/MD notified:[name]  Hypoglycemic Event  CBG: 62  Treatment: D50 IV 50 mL  Symptoms: None  Follow-up CBG: Time:2321 CBG Result:174  Possible Reasons for Event: Unknown  Comments/MD notified:[name]    [name] I

## 2015-12-07 NOTE — Progress Notes (Signed)
Patient ID: [name], female   DOB: 12/27/1970, 45 y.o.   MRN: [id number] Patient is s/p BKA right, incision is clean minimal drainage, continue prosthetic shrinker, f/u in my office in 1 week

## 2015-12-07 NOTE — Progress Notes (Signed)
[facility] Progress Note  Patient: [name] [id number]   PCP: Benito Mccreedy, MD DOB: 1970/09/17   DOA: 12/06/2015   DOS: 12/07/2015   Date of Service: the patient was seen and examined on 12/07/2015  Subjective: She states she has upper abdominal pain and diarrhea, but no vomiting, dysuria, or SOB    Nutrition: NPO possible surgery  Brief hospital course: Pt. with PMH of ESRD s/p renal transplant in 2014, IDDM, PVD with BKA on 11/29/2015, and anemia secondary to CKD ; admitted on 12/06/2015, with complaint of weakness, confusion, upper abdominal pain, nausea, vomiting, and diarrhea. Was found to have a Hb of 6.8 and transfused 1 unit of PRBC. Elevated LFTs. U/S confirms cholecystitis. Urine shows UTI and started of ceftriaxone. [place] transplant, recommending management by Nephrologist here. Consulted general surgery and nephrologist.  Currently further plan is possible cholecystectomy awaiting surgery consult will continue ABX.  Assessment and Plan: Acute Cholecystitis: RUQ U/s shows gallbladder thickening with sludge. No dilatation of the bile ducts LFTs are elevated, Total bilirubin 1.7, PT 18.2, INR 1.5 Placed on IV ceftriaxone and IVFs, currently NPO. Patient 3 lose watery BM. Probably from Senokot, discontinued. Consulted general surgery, plan is for cholecystectomy tomorrow.   Weakness and altered mental status Presented with confusion and weakness.  Likely secondary to cholecystitis or UTI.   Resolved today with fluids and ABX.   Acute blood loss anemia on chronic anemia of CKD:  Most likely Secondary to recent BKA on 6/14  Hb 6.8 on admission, transfused 1 unit of PRBC, today Hb 8.3 Continue to monitor with CBC Continue home iron supplementation Check FOBT  Acute kidney injury: Baseline Cr 1.0  Kidney transplant in 2014, on Prograft, mycophenolate and Bactrim. Will hold medications for possible toxicity and in the setting on ongoing  infection. Prograft level pending. Transplant surgery at [facility] recommended pt can continue to have her nephrology care here. Consulted Nephrology, awaiting consult Started IV NS 37ml/Hr  UTI: Pyuria and bacteriuria with recent hospitalization. immunosupressed  Urine culture pending Continue Ceftriaxone 1 g daily  IDDM: Continue with Levemir 10 units in BID with sliding scale corrections. On 14 and 22 units at home. Monitor hypoglycemia while NPO.  Peripheral vascular disease/ HTN: Continue amlodipine, clonidine, labetalol, aspirin Discontinue statin due to elevated LFTs  GERD Continue PPI  Severe protein calorie malnutrition:  Ensure daily  Post-operative care:  ED contacted Dr. Sharol Given for wound evaluation of recent BKA May continue Robaxin and Percocet PRN Duda's note states "incision is clean minimal drainage, continue prosthetic shrinker, f/u in my office in 1 week" Will continue to evaluate during hospital stay.  Acute Hypoxia 93% spo2 on ED presentation. Placed on 2 L O2 nasal cannula. Not on O2 at baseline. 95% spo2 today on 2 L.   BNP is elevated but currently has AKI, does not appear clinically volume overloaded. ECHO on 10/14/15 shows EF of 65-70% D-dimer is 3.78 will order V/Q scan for pre-op risk assessment   Pain management: Percocet Activity: currently independent Bowel regimen: last BM 12/07/2015 Diet: NPO DVT Prophylaxis: SCDs.  Advance goals of care discussion: full code  Family Communication: The pt provided permission to discuss medical plan with the family. Opportunity was given to ask question and all questions were answered satisfactorily.   Disposition:  Discharge to home. Expected discharge date: determined by decrease in LFTs after cholecystectomy and negative V/Q scan.  Consultants: Nephrology, General Surgery, Orthropedics Procedures: RUQ ultrasound- showing cholecystis with no bile duct dilatation.  Antibiotics: Anti-infectives     Start     Dose/Rate Route Frequency Ordered Stop   12/08/15 1000  sulfamethoxazole-trimethoprim (BACTRIM,SEPTRA) 400-80 MG per tablet 1 tablet     1 tablet Oral Every M-W-F 12/06/15 2220     12/06/15 2300  cefTRIAXone (ROCEPHIN) 1 g in dextrose 5 % 50 mL IVPB     1 g 100 mL/hr over 30 Minutes Intravenous Every 24 hours 12/06/15 2222          Intake/Output Summary (Last 24 hours) at 12/07/15 1207 Last data filed at 12/07/15 1204  Gross per 24 hour  Intake      0 ml  Output      0 ml  Net      0 ml   There were no vitals filed for this visit.  Objective: Physical Exam: Filed Vitals:   12/06/15 2030 12/06/15 2206 12/07/15 0416 12/07/15 0933  BP: 132/58 144/65 158/62 147/63  Pulse: 103 104 104 95  Temp:  98.9 F (37.2 C) 98.5 F (36.9 C) 98 F (36.7 C)  TempSrc:  Oral Oral Oral  Resp: 15 16 16 18   SpO2: 95% 94% 94% 95%    General: Alert, Awake and Oriented to Time, Place and Person. Appear in mild distress Eyes: PERRL, Conjunctiva normal ENT: Oral Mucosa clear moist. Cardiovascular: S1 and S2 Present, no Murmur, Respiratory: Bilateral Air entry equal and Decreased, Clear to Auscultation, no Crackles, no wheezes Abdomen: Bowel Sound present, Soft and RUQ/epigastric tenderness Skin: no redness, no Rash  Extremities: no Pedal edema, no calf tenderness Neurologic: Grossly no focal neuro deficit. Bilaterally Equal motor strength.  Data Reviewed: CBC:  Recent Labs Lab 12/06/15 1838 12/07/15 0205  WBC 4.0 4.3  NEUTROABS 2.5  --   HGB 6.8* 8.3*  HCT 22.5* 26.0*  MCV 75.3* 75.4*  PLT 353 [id number]   Basic Metabolic Panel:  Recent Labs Lab 12/06/15 1838 12/07/15 0205  NA 134* 133*  K 4.4 4.0  CL 99* 98*  CO2 25 24  GLUCOSE 137* 141*  BUN 43* 44*  CREATININE 1.73* 1.60*  CALCIUM 8.8* 8.6*    Liver Function Tests:  Recent Labs Lab 12/06/15 1838 12/07/15 0205  AST 88* 62*  ALT 113* 98*  ALKPHOS 793* 754*  BILITOT 1.1 1.7*  PROT 5.6* 5.7*  ALBUMIN 2.2*  2.3*   No results for input(s): LIPASE, AMYLASE in the last 168 hours.  Recent Labs Lab 12/06/15 1838  AMMONIA 21   Coagulation Profile:  Recent Labs Lab 12/06/15 1838  INR 1.50*   Cardiac Enzymes: No results for input(s): CKTOTAL, CKMB, CKMBINDEX, TROPONINI in the last 168 hours. BNP (last 3 results) No results for input(s): PROBNP in the last 8760 hours.  CBG:  Recent Labs Lab 12/04/15 1116 12/04/15 1632 12/04/15 2139 12/05/15 0639 12/06/15 2211  GLUCAP 102* 140* 106* 99 125*    Studies: Dg Chest 2 View  12/06/2015  CLINICAL DATA:  Fatigue, weakness, poor appetite. Decreased O2 sats. EXAM: CHEST  2 VIEW COMPARISON:  10/14/2015 FINDINGS: Cardiomegaly. No overt edema. Lingular and right basilar atelectasis. No effusions. No acute bony abnormality. IMPRESSION: Bibasilar atelectasis.  Cardiomegaly. Electronically Signed   By: Rolm Baptise M.D.   On: 12/06/2015 19:40   Ct Head Wo Contrast  12/06/2015  CLINICAL DATA:  Per [facility], pt from home. Pt had R leg BKA on the 14th, released yesterday. Everything has been good, when she got home, c/o generalized weakness, altered, abnormal behavior, incontinence per family. o2 sats  room air 87% per fire, pt placed on Ruth, Sats 97%. VSS. HR 106, BP 130/60. Pt is AAOX4. Denies pain medicine. Pt weak, poor appetite. Pt taken prescribed pain medicine. EXAM: CT HEAD WITHOUT CONTRAST TECHNIQUE: Contiguous axial images were obtained from the base of the skull through the vertex without intravenous contrast. COMPARISON:  03/29/2011 FINDINGS: The ventricles are normal in size and configuration. There are no parenchymal masses or mass effect, no evidence of an infarct, no extra-axial masses or abnormal fluid collections and no intracranial hemorrhage. There is a small amount of dependent fluid in the left sphenoid sinus. There is mild sinus mucosal thickening involving all sinuses. The mastoid air cells are mostly opacified bilaterally. No skull lesion.  IMPRESSION: 1. No intracranial abnormality. 2. Sinus mucosal thickening, mild. Bilateral mastoid air cell opacification, likely effusions. Electronically Signed   By: Lajean Manes M.D.   On: 12/06/2015 19:21   US Abdomen Complete  12/06/2015  CLINICAL DATA:  Acute onset of elevated LFTs. Generalized abdominal pain. Initial encounter. EXAM: ABDOMEN ULTRASOUND COMPLETE COMPARISON:  CT of the abdomen and pelvis from 04/02/2015, and renal ultrasound performed 03/31/2011 FINDINGS: Gallbladder: The gallbladder is filled with small stones and sludge, with significant surrounding pericholecystic fluid and a positive ultrasonographic Murphy's sign. Gallbladder wall thickening is noted. Findings are concerning for acute cholecystitis. Common bile duct: Diameter: 0.6 cm, within normal limits in caliber. Liver: No focal lesion identified. Within normal limits in parenchymal echogenicity. IVC: No abnormality visualized. Pancreas: Visualized portion unremarkable. Spleen: Size and appearance within normal limits. Right Kidney: Length: 7.1 cm. Diffusely increased parenchymal echogenicity. Moderate right renal atrophy is noted. No mass or hydronephrosis visualized. Left Kidney: Length: 7.0 cm. Diffusely increased parenchymal echogenicity. Moderate left renal atrophy is noted. No mass or hydronephrosis visualized. Abdominal aorta: No aneurysm visualized. Other findings: The patient's right iliac fossa transplant kidney is unremarkable in appearance, measuring 12.0 cm in length, with a single small 1.1 cm cyst. IMPRESSION: 1. Stones and sludge filling the gallbladder, with significant surrounding pericholecystic fluid, gallbladder wall thickening and a positive ultrasonographic Murphy's sign. Findings are concerning for acute cholecystitis. 2. Right iliac fossa transplant kidney is unremarkable in appearance, with a small renal cyst. Moderate atrophy of the native kidneys. Electronically Signed   By: Garald Balding M.D.   On:  12/06/2015 23:32     Scheduled Meds: . amLODipine  10 mg Oral QHS  . aspirin  81 mg Oral Daily  . cefTRIAXone (ROCEPHIN)  IV  1 g Intravenous Q24H  . cilostazol  100 mg Oral BID  . [START ON 12/08/2015] cloNIDine  0.2 mg Transdermal Q Fri  . feeding supplement (ENSURE ENLIVE)  237 mL Oral BID BM  . ferrous sulfate  325 mg Oral TID WC  . insulin aspart  0-15 Units Subcutaneous TID WC  . insulin aspart  0-5 Units Subcutaneous QHS  . insulin detemir  10 Units Subcutaneous BID  . labetalol  300 mg Oral BID  . mycophenolate  360 mg Oral QHS  . mycophenolate  540 mg Oral Daily  . pantoprazole  40 mg Oral QHS  . pravastatin  20 mg Oral QHS  . senna-docusate  1 tablet Oral BID  . [START ON 12/08/2015] sulfamethoxazole-trimethoprim  1 tablet Oral Q M,W,F  . tacrolimus  1.5 mg Oral BID   Continuous Infusions:  PRN Meds: acetaminophen, methocarbamol, oxyCODONE-acetaminophen  Time spent: 30 minutes  Author: Grayland Ormond PA-S   Supervising attending attestation  I have interviewed and examined  the patient and agree with the documentation and management as recorded by the Advanced Practice Provider. I have made any necessary editorial changes.I have independently reviewed the clinical findings, lab results and imaging studies.  Author: Berle Mull, MD [facility] 12/07/2015 2:34 PM

## 2015-12-07 NOTE — Consult Note (Signed)
Reason for Consult:Acute calculus cholecystitis  Referring Physician: Myrene Buddy  [name] is an 45 y.o. female.  HPI: [name], with a PMH significant for renal transplant 2/2 HTN/DM renal failure and recent (6/14) right BKA among myriad other problems, returned to the hospital yesterday with some confusion. She was just discharged on Tuesday. She has been having abd pain since Tuesday before discharge which was apparently thought to be constipation and she was given a stool softener. The pain waxed and waned over the next couple of days but was not associated with any nausea or vomiting or oral intake. There was no radiation. The only thing that made it better was a muscle relaxer. On evaluation in the ED her LFT's were noted to be abnormal and, with the abd pain, an [place] was ordered that showed acute calculus cholecystitis.   Past Medical History  Diagnosis Date  . Retinopathy   . Metabolic bone disease   . Anemia   . CHF (congestive heart failure) ([place])   . History of blood transfusion     related to "kidney transplant"  . Hypertension     sees Dr. Carolin Guernsey  . High cholesterol   . ESRD (end stage renal disease) on dialysis ([place]) 09/28/2011-01/23/2013    M-W-F; [place]  . Pneumonia 09/27/2011  . Type II diabetes mellitus ([place])     "controlled with diet"  . Critical ischemia of lower extremity hospitalized 10/05/2015    right  . Peripheral vascular disease ([place])   . Diabetic neuropathy ([place])   . GERD (gastroesophageal reflux disease)     Past Surgical History  Procedure Laterality Date  . Insertion of dialysis catheter  10/04/2011    Procedure: INSERTION OF DIALYSIS CATHETER;  Surgeon: Angelia Mould, MD;  Location: [facility];  Service: Vascular;  Laterality: Right;  insertion of dialysis catheter right internal jugular  . Av fistula placement  10/04/2011    Procedure: INSERTION OF ARTERIOVENOUS (AV) GORE-TEX GRAFT ARM;  Surgeon: Angelia Mould, MD;   Location: [facility];  Service: Vascular;  Laterality: Left;  Insertion left upper arm Arteriovenous goretex graft  . Avgg removal  10/04/2011    Procedure: REMOVAL OF ARTERIOVENOUS GORETEX GRAFT ([place]);  Surgeon: Elam Dutch, MD;  Location: [facility];  Service: Vascular;  Laterality: Left;  . Av fistula placement  10/29/2011    Procedure: ARTERIOVENOUS (AV) FISTULA CREATION;  Surgeon: Angelia Mould, MD;  Location: [facility];  Service: Vascular;  Laterality: Right;  Creation Right Arteriovenous Fistula   . Revison of arteriovenous fistula  06/23/2012    Procedure: REVISON OF ARTERIOVENOUS FISTULA;  Surgeon: Angelia Mould, MD;  Location: [facility];  Service: Vascular;  Laterality: Right;  Ultrasound guided  . Insertion of dialysis catheter  06/23/2012    Procedure: INSERTION OF DIALYSIS CATHETER;  Surgeon: Angelia Mould, MD;  Location: [facility];  Service: Vascular;  Laterality: N/A;  Ultrasound guided  . Patch angioplasty  06/23/2012    Procedure: PATCH ANGIOPLASTY;  Surgeon: Angelia Mould, MD;  Location: [facility];  Service: Vascular;  Laterality: Right;  . Esophagogastroduodenoscopy N/A 12/24/2012    Procedure: ESOPHAGOGASTRODUODENOSCOPY (EGD);  Surgeon: Milus Banister, MD;  Location: [facility];  Service: Endoscopy;  Laterality: N/A;  . Kidney transplant  01/24/2013  . Finger amputation Right 02/26/2013    "3rd finger"  . Shuntogram N/A 04/06/2012    Procedure: Earney Mallet;  Surgeon: Angelia Mould, MD;  Location: [facility] CATH LAB;  Service: Cardiovascular;  Laterality: N/A;  . Peripheral vascular catheterization N/A 09/19/2015    Procedure: Lower Extremity Angiography;  Surgeon: Adrian Prows, MD;  Location: [facility] CV LAB;  Service: Cardiovascular;  Laterality: N/A;  . Peripheral vascular catheterization N/A 09/19/2015    Procedure: Abdominal Aortogram;  Surgeon: Adrian Prows, MD;  Location: [facility] CV LAB;  Service: Cardiovascular;  Laterality: N/A;  . Peripheral vascular  catheterization Right 09/19/2015    Procedure: Peripheral Vascular Intervention;  Surgeon: Adrian Prows, MD;  Location: [facility] CV LAB;  Service: Cardiovascular;  Laterality: Right;  Right Common  Iliac  . Peripheral vascular catheterization N/A 10/06/2015    Procedure: Lower Extremity Angiography;  Surgeon: Adrian Prows, MD;  Location: [facility] CV LAB;  Service: Cardiovascular;  Laterality: N/A;  . Peripheral vascular catheterization  10/06/2015    Procedure: Peripheral Vascular Intervention;  Surgeon: Adrian Prows, MD;  Location: [facility] CV LAB;  Service: Cardiovascular;;  REIA  Omnilink 6.0x29, innova 7x80  RSFA innova 5x80  . Amputation Right 10/13/2015    Procedure: Right Transmetatarsal Amputation;  Surgeon: Newt Minion, MD;  Location: [place] [facility];  Service: Orthopedics;  Laterality: Right;  . Amputation Right 11/29/2015    Procedure: RIGHT BELOW KNEE AMPUTATION;  Surgeon: Newt Minion, MD;  Location: [facility];  Service: Orthopedics;  Laterality: Right;    Family History  Problem Relation Age of Onset  . Malignant hyperthermia Mother   . Malignant hyperthermia Father   . Anesthesia problems Neg Hx   . Thyroid disease Mother   . Diabetes Brother   . Heart disease Brother     CHF  . Hypertension Mother   . Hypertension Father     Social History:  reports that she has never smoked. She has never used smokeless tobacco. She reports that she does not drink alcohol or use illicit drugs.  Allergies: No Known Allergies  Medications: I have reviewed the patient's current medications.  Results for orders placed or performed during the hospital encounter of 12/06/15 (from the past 48 hour(s))  Sodium, urine, random     Status: None   Collection Time: 12/06/15  8:59 AM  Result Value Ref Range   Sodium, Ur <10 mmol/L    Comment: RESULT REPEATED AND VERIFIED  Creatinine, urine, random     Status: None   Collection Time: 12/06/15  8:59 AM  Result Value Ref Range   Creatinine, Urine 215.25 mg/dL   Protein / creatinine ratio, urine     Status: Abnormal   Collection Time: 12/06/15  9:00 AM  Result Value Ref Range   Creatinine, Urine 216.60 mg/dL   Total Protein, Urine 70 mg/dL    Comment: NO NORMAL RANGE ESTABLISHED FOR THIS TEST   Protein Creatinine Ratio 0.32 (H) 0.00 - 0.15 mg/mg[Cre]  APTT     Status: None   Collection Time: 12/06/15  6:38 PM  Result Value Ref Range   aPTT 35 24 - 37 seconds  Comprehensive metabolic panel     Status: Abnormal   Collection Time: 12/06/15  6:38 PM  Result Value Ref Range   Sodium 134 (L) 135 - 145 mmol/L   Potassium 4.4 3.5 - 5.1 mmol/L   Chloride 99 (L) 101 - 111 mmol/L   CO2 25 22 - 32 mmol/L   Glucose, Bld 137 (H) 65 - 99 mg/dL   BUN 43 (H) 6 - 20 mg/dL   Creatinine, Ser 1.73 (H) 0.44 - 1.00 mg/dL   Calcium 8.8 (L) 8.9 - 10.3  mg/dL   Total Protein 5.6 (L) 6.5 - 8.1 g/dL   Albumin 2.2 (L) 3.5 - 5.0 g/dL   AST 88 (H) 15 - 41 U/L   ALT 113 (H) 14 - 54 U/L   Alkaline Phosphatase 793 (H) 38 - 126 U/L   Total Bilirubin 1.1 0.3 - 1.2 mg/dL   GFR calc non Af Amer 34 (L) >60 mL/min   GFR calc Af Amer 40 (L) >60 mL/min    Comment: (NOTE) The eGFR has been calculated using the CKD EPI equation. This calculation has not been validated in all clinical situations. eGFR's persistently <60 mL/min signify possible Chronic Kidney Disease.    Anion gap 10 5 - 15  CBC WITH DIFFERENTIAL     Status: Abnormal   Collection Time: 12/06/15  6:38 PM  Result Value Ref Range   WBC 4.0 4.0 - 10.5 K/uL   RBC 2.99 (L) 3.87 - 5.11 MIL/uL   Hemoglobin 6.8 (LL) 12.0 - 15.0 g/dL    Comment: REPEATED TO VERIFY CRITICAL RESULT CALLED TO, READ BACK BY AND VERIFIED WITH: T GOSS,RN 1900 12/06/15 D [place]    HCT 22.5 (L) 36.0 - 46.0 %   MCV 75.3 (L) 78.0 - 100.0 fL   MCH 22.7 (L) 26.0 - 34.0 pg   MCHC 30.2 30.0 - 36.0 g/dL   RDW 17.9 (H) 11.5 - 15.5 %   Platelets 353 150 - 400 K/uL   Neutrophils Relative % 63 %   Lymphocytes Relative 18 %   Monocytes  Relative 17 %   Eosinophils Relative 2 %   Basophils Relative 0 %   Neutro Abs 2.5 1.7 - 7.7 K/uL   Lymphs Abs 0.7 0.7 - 4.0 K/uL   Monocytes Absolute 0.7 0.1 - 1.0 K/uL   Eosinophils Absolute 0.1 0.0 - 0.7 K/uL   Basophils Absolute 0.0 0.0 - 0.1 K/uL   WBC Morphology MILD LEFT SHIFT (1-5% METAS, OCC MYELO, OCC BANDS)     Comment: DOHLE BODIES   Smear Review LARGE PLATELETS PRESENT   Protime-INR     Status: Abnormal   Collection Time: 12/06/15  6:38 PM  Result Value Ref Range   Prothrombin Time 18.2 (H) 11.6 - 15.2 seconds   INR 1.50 (H) 0.00 - 1.49  Type and screen     Status: None (Preliminary result)   Collection Time: 12/06/15  6:38 PM  Result Value Ref Range   ABO/RH(D) A POS    Antibody Screen NEG    Sample Expiration 12/09/2015    Unit Number [id number]    Blood Component Type RED CELLS,LR    Unit division 00    Status of Unit ISSUED,FINAL    Transfusion Status OK TO TRANSFUSE    Crossmatch Result Compatible    Unit Number [id number]    Blood Component Type RED CELLS,LR    Unit division 00    Status of Unit ALLOCATED    Transfusion Status OK TO TRANSFUSE    Crossmatch Result Compatible   Ammonia     Status: None   Collection Time: 12/06/15  6:38 PM  Result Value Ref Range   Ammonia 21 9 - 35 umol/L  Brain natriuretic peptide     Status: Abnormal   Collection Time: 12/06/15  6:38 PM  Result Value Ref Range   B Natriuretic Peptide 221.9 (H) 0.0 - 100.0 pg/mL  I-stat troponin, ED     Status: None   Collection Time: 12/06/15  6:59 PM  Result Value Ref  Range   Troponin i, poc 0.00 0.00 - 0.08 ng/mL   Comment 3            Comment: Due to the release kinetics of cTnI, a negative result within the first hours of the onset of symptoms does not rule out myocardial infarction with certainty. If myocardial infarction is still suspected, repeat the test at appropriate intervals.   Urinalysis, Routine w reflex microscopic (not at [facility])     Status: Abnormal    Collection Time: 12/06/15  7:08 PM  Result Value Ref Range   Color, Urine RED (A) YELLOW    Comment: BIOCHEMICALS MAY BE AFFECTED BY COLOR   APPearance CLOUDY (A) CLEAR   Specific Gravity, Urine 1.022 1.005 - 1.030   pH 5.0 5.0 - 8.0   Glucose, UA NEGATIVE NEGATIVE mg/dL   Hgb urine dipstick LARGE (A) NEGATIVE   Bilirubin Urine LARGE (A) NEGATIVE   Ketones, ur 15 (A) NEGATIVE mg/dL   Protein, ur NEGATIVE NEGATIVE mg/dL   Nitrite NEGATIVE NEGATIVE   Leukocytes, UA SMALL (A) NEGATIVE  Urine microscopic-add on     Status: Abnormal   Collection Time: 12/06/15  7:08 PM  Result Value Ref Range   Squamous Epithelial / LPF 0-5 (A) NONE SEEN   WBC, UA 6-30 0 - 5 WBC/hpf   RBC / HPF 6-30 0 - 5 RBC/hpf   Bacteria, UA MANY (A) NONE SEEN   Casts HYALINE CASTS (A) NEGATIVE  Prepare RBC     Status: None   Collection Time: 12/06/15  7:39 PM  Result Value Ref Range   Order Confirmation ORDER PROCESSED BY BLOOD BANK   I-Stat Arterial Blood Gas, ED - (order at [facility] and [facility] only)     Status: Abnormal   Collection Time: 12/06/15  9:04 PM  Result Value Ref Range   pH, Arterial 7.382 7.350 - 7.450   pCO2 arterial 41.8 35.0 - 45.0 mmHg   pO2, Arterial 53.0 (L) 80.0 - 100.0 mmHg   Bicarbonate 24.8 (H) 20.0 - 24.0 mEq/L   TCO2 26 0 - 100 mmol/L   O2 Saturation 86.0 %   Patient temperature 98.6 F    Collection site RADIAL, ALLEN'S TEST ACCEPTABLE    Drawn by RT    Sample type ARTERIAL   Glucose, capillary     Status: Abnormal   Collection Time: 12/06/15 10:11 PM  Result Value Ref Range   Glucose-Capillary 125 (H) 65 - 99 mg/dL  Comprehensive metabolic panel     Status: Abnormal   Collection Time: 12/07/15  2:05 AM  Result Value Ref Range   Sodium 133 (L) 135 - 145 mmol/L   Potassium 4.0 3.5 - 5.1 mmol/L   Chloride 98 (L) 101 - 111 mmol/L   CO2 24 22 - 32 mmol/L   Glucose, Bld 141 (H) 65 - 99 mg/dL   BUN 44 (H) 6 - 20 mg/dL   Creatinine, Ser 1.60 (H) 0.44 - 1.00 mg/dL   Calcium 8.6 (L) 8.9 -  10.3 mg/dL   Total Protein 5.7 (L) 6.5 - 8.1 g/dL   Albumin 2.3 (L) 3.5 - 5.0 g/dL   AST 62 (H) 15 - 41 U/L   ALT 98 (H) 14 - 54 U/L   Alkaline Phosphatase 754 (H) 38 - 126 U/L   Total Bilirubin 1.7 (H) 0.3 - 1.2 mg/dL   GFR calc non Af Amer 38 (L) >60 mL/min   GFR calc Af Amer 44 (L) >60 mL/min  Comment: (NOTE) The eGFR has been calculated using the CKD EPI equation. This calculation has not been validated in all clinical situations. eGFR's persistently <60 mL/min signify possible Chronic Kidney Disease.    Anion gap 11 5 - 15  CBC     Status: Abnormal   Collection Time: 12/07/15  2:05 AM  Result Value Ref Range   WBC 4.3 4.0 - 10.5 K/uL   RBC 3.45 (L) 3.87 - 5.11 MIL/uL   Hemoglobin 8.3 (L) 12.0 - 15.0 g/dL   HCT 26.0 (L) 36.0 - 46.0 %   MCV 75.4 (L) 78.0 - 100.0 fL   MCH 24.1 (L) 26.0 - 34.0 pg   MCHC 31.9 30.0 - 36.0 g/dL   RDW 17.8 (H) 11.5 - 15.5 %   Platelets 311 150 - 400 K/uL  D-dimer, quantitative (not at [facility])     Status: Abnormal   Collection Time: 12/07/15  2:05 AM  Result Value Ref Range   D-Dimer, Quant 3.78 (H) 0.00 - 0.50 ug/mL-FEU    Comment: (NOTE) At the manufacturer cut-off of 0.50 ug/mL FEU, this assay has been documented to exclude PE with a sensitivity and negative predictive value of 97 to 99%.  At this time, this assay has not been approved by the FDA to exclude DVT/VTE. Results should be correlated with clinical presentation.   Glucose, capillary     Status: Abnormal   Collection Time: 12/07/15 12:04 PM  Result Value Ref Range   Glucose-Capillary 119 (H) 65 - 99 mg/dL    Dg Chest 2 View  12/06/2015  CLINICAL DATA:  Fatigue, weakness, poor appetite. Decreased O2 sats. EXAM: CHEST  2 VIEW COMPARISON:  10/14/2015 FINDINGS: Cardiomegaly. No overt edema. Lingular and right basilar atelectasis. No effusions. No acute bony abnormality. IMPRESSION: Bibasilar atelectasis.  Cardiomegaly. Electronically Signed   By: Rolm Baptise M.D.   On: 12/06/2015  19:40   Ct Head Wo Contrast  12/06/2015  CLINICAL DATA:  Per [facility], pt from home. Pt had R leg BKA on the 14th, released yesterday. Everything has been good, when she got home, c/o generalized weakness, altered, abnormal behavior, incontinence per family. o2 sats room air 87% per fire, pt placed on [place], Sats 97%. VSS. HR 106, BP 130/60. Pt is AAOX4. Denies pain medicine. Pt weak, poor appetite. Pt taken prescribed pain medicine. EXAM: CT HEAD WITHOUT CONTRAST TECHNIQUE: Contiguous axial images were obtained from the base of the skull through the vertex without intravenous contrast. COMPARISON:  03/29/2011 FINDINGS: The ventricles are normal in size and configuration. There are no parenchymal masses or mass effect, no evidence of an infarct, no extra-axial masses or abnormal fluid collections and no intracranial hemorrhage. There is a small amount of dependent fluid in the left sphenoid sinus. There is mild sinus mucosal thickening involving all sinuses. The mastoid air cells are mostly opacified bilaterally. No skull lesion. IMPRESSION: 1. No intracranial abnormality. 2. Sinus mucosal thickening, mild. Bilateral mastoid air cell opacification, likely effusions. Electronically Signed   By: Lajean Manes M.D.   On: 12/06/2015 19:21   US Abdomen Complete  12/06/2015  CLINICAL DATA:  Acute onset of elevated LFTs. Generalized abdominal pain. Initial encounter. EXAM: ABDOMEN ULTRASOUND COMPLETE COMPARISON:  CT of the abdomen and pelvis from 04/02/2015, and renal ultrasound performed 03/31/2011 FINDINGS: Gallbladder: The gallbladder is filled with small stones and sludge, with significant surrounding pericholecystic fluid and a positive ultrasonographic Murphy's sign. Gallbladder wall thickening is noted. Findings are concerning for acute cholecystitis. Common bile duct:  Diameter: 0.6 cm, within normal limits in caliber. Liver: No focal lesion identified. Within normal limits in parenchymal echogenicity. IVC: No  abnormality visualized. Pancreas: Visualized portion unremarkable. Spleen: Size and appearance within normal limits. Right Kidney: Length: 7.1 cm. Diffusely increased parenchymal echogenicity. Moderate right renal atrophy is noted. No mass or hydronephrosis visualized. Left Kidney: Length: 7.0 cm. Diffusely increased parenchymal echogenicity. Moderate left renal atrophy is noted. No mass or hydronephrosis visualized. Abdominal aorta: No aneurysm visualized. Other findings: The patient's right iliac fossa transplant kidney is unremarkable in appearance, measuring 12.0 cm in length, with a single small 1.1 cm cyst. IMPRESSION: 1. Stones and sludge filling the gallbladder, with significant surrounding pericholecystic fluid, gallbladder wall thickening and a positive ultrasonographic Murphy's sign. Findings are concerning for acute cholecystitis. 2. Right iliac fossa transplant kidney is unremarkable in appearance, with a small renal cyst. Moderate atrophy of the native kidneys. Electronically Signed   By: Garald Balding M.D.   On: 12/06/2015 23:32    Review of Systems  Gastrointestinal: Positive for abdominal pain. Negative for nausea, vomiting, diarrhea and constipation.  Neurological:       Disorientation  All other systems reviewed and are negative.  Blood pressure 147/63, pulse 95, temperature 98 F (36.7 C), temperature source Oral, resp. rate 18, last menstrual period 09/05/2015, SpO2 95 %. Physical Exam  Constitutional: She appears well-developed and well-nourished. No distress.  HENT:  Head: Normocephalic.  Eyes: Conjunctivae are normal. Right eye exhibits no discharge. Left eye exhibits no discharge. No scleral icterus.  Neck: Normal range of motion. Neck supple.  Cardiovascular: Normal rate, regular rhythm and normal heart sounds.  Exam reveals no gallop and no friction rub.   No murmur heard. Respiratory: Effort normal and breath sounds normal. No respiratory distress. She has no wheezes.  She has no rales.  GI: Normal appearance. Bowel sounds are decreased. There is tenderness in the right upper quadrant and epigastric area. There is guarding and positive Murphy's sign. There is no rebound.  Musculoskeletal: Normal range of motion.  Lymphadenopathy:    She has no cervical adenopathy.  Neurological: She is alert.  Skin: Skin is warm and dry. She is not diaphoretic.  Psychiatric: She has a normal mood and affect. Her behavior is normal.    Assessment/Plan: Acute calculus cholecystitis -- Plan for lap chole in am. NPO after midnight. I spoke with the patient's mother and answered questions at the patient's request. Multiple medical problems -- Home meds    Lisette Abu, PA-C Pager: [phone] 12/07/2015, 12:58 PM

## 2015-12-07 NOTE — Consult Note (Signed)
Reason for Consult: Renal transplant, elevated Cr Referring Physician: Dr. Laymond Purser [name] is an 45 y.o. female.  HPI: 45 yr female with ESRD from Type 2 DM, on HD 2013-2014.  Received DDT 01/2013, had post tx extensive immunosuppressives for Humeral AB in Transplant.   Baseline  Cr .7-1.2.  Recurrent prob with ischemic PVD and multiple procedures on R leg.  Ended up with R BKA with hosp 6/14-6/20.  Cr on admit .9 and not rechecked to d/c on 6/20.  Admitted now with Cr 1.7 yest, now 1.6.   Hb 8.7 on 6/14, not checked post op.  Now 6.8 on admit. Found at home with AMS and incontinence.    Now found to have GB dz.  Had missed 1 dose Prograf.   No recollection of D, V, edema, voiding prob.  . Primary Nephrologist Dr. Elmarie Shiley.  Constitutional: weak Eyes: negative Ears, nose, mouth, throat, and face: negative Respiratory: negative Cardiovascular: negative Gastrointestinal: pain mid epigastrium Genitourinary:negative Integument/breast: negative Hematologic/lymphatic: as above Musculoskeletal:pain in stump Neurological: negative Endocrine: DM Allergic/Immunologic: negative    Past Medical History  Diagnosis Date  . Retinopathy   . Metabolic bone disease   . Anemia   . CHF (congestive heart failure) ([place])   . History of blood transfusion     related to "kidney transplant"  . Hypertension     sees Dr. Carolin Guernsey  . High cholesterol   . ESRD (end stage renal disease) on dialysis ([place]) 09/28/2011-01/23/2013    M-W-F; [facility]  . Pneumonia 09/27/2011  . Type II diabetes mellitus ([place])     "controlled with diet"  . Critical ischemia of lower extremity hospitalized 10/05/2015    right  . Peripheral vascular disease ([place])   . Diabetic neuropathy ([place])   . GERD (gastroesophageal reflux disease)     Past Surgical History  Procedure Laterality Date  . Insertion of dialysis catheter  10/04/2011    Procedure: INSERTION OF DIALYSIS CATHETER;  Surgeon: Angelia Mould, MD;   Location: [place];  Service: Vascular;  Laterality: Right;  insertion of dialysis catheter right internal jugular  . Av fistula placement  10/04/2011    Procedure: INSERTION OF ARTERIOVENOUS (AV) GORE-TEX GRAFT ARM;  Surgeon: Angelia Mould, MD;  Location: [place];  Service: Vascular;  Laterality: Left;  Insertion left upper arm Arteriovenous goretex graft  . Avgg removal  10/04/2011    Procedure: REMOVAL OF ARTERIOVENOUS GORETEX GRAFT ([place]);  Surgeon: Elam Dutch, MD;  Location: [place];  Service: Vascular;  Laterality: Left;  . Av fistula placement  10/29/2011    Procedure: ARTERIOVENOUS (AV) FISTULA CREATION;  Surgeon: Angelia Mould, MD;  Location[facility];  Service: Vascular;  Laterality: Right;  Creation Right Arteriovenous Fistula   . Revison of arteriovenous fistula  06/23/2012    Procedure: REVISON OF ARTERIOVENOUS FISTULA;  Surgeon: Angelia Mould, MD;  Location: [place];  Service: Vascular;  Laterality: Right;  Ultrasound guided  . Insertion of dialysis catheter  06/23/2012    Procedure: INSERTION OF DIALYSIS CATHETER;  Surgeon: Angelia Mould, MD;  Location: [place];  Service: Vascular;  Laterality: N/A;  Ultrasound guided  . Patch angioplasty  06/23/2012    Procedure: PATCH ANGIOPLASTY;  Surgeon: Angelia Mould, MD;  [facility] OR;  Service: Vascular;  Laterality: Right;  . Esophagogastroduodenoscopy N/A 12/24/2012    Procedure: ESOPHAGOGASTRODUODENOSCOPY (EGD);  Surgeon: Milus Banister, MD;  Location: [place];  Service: Endoscopy;  Laterality:  N/A;  . Kidney transplant  01/24/2013  . Finger amputation Right 02/26/2013    "3rd finger"  . Shuntogram N/A 04/06/2012    Procedure: Ear[place];  Surgeon: Angelia Mould, MD;  Location: [facility] CATH LAB;  Service: Cardiovascular;  Laterality: N/A;  . Peripheral vascular catheterization N/A 09/19/2015    Procedure: Lower Extremity Angiography;  Surgeon: Adrian Prows, MD;  Location: [place] CV LAB;  Service:  Cardiovascular;  Laterality: N/A;  . Peripheral vascular catheterization N/A 09/19/2015    Procedure: Abdominal Aortogram;  Surgeon: Adrian Prows, MD;  Location: [place] CV LAB;  Service: Cardiovascular;  Laterality: N/A;  . Peripheral vascular catheterization Right 09/19/2015    Procedure: Peripheral Vascular Intervention;  Surgeon: Adrian Prows, MD;  Location: [place] CV LAB;  Service: Cardiovascular;  Laterality: Right;  Right Common  Iliac  . Peripheral vascular catheterization N/A 10/06/2015    Procedure: Lower Extremity Angiography;  Surgeon: Adrian Prows, MD;  Location: [place] CV LAB;  Service: Cardiovascular;  Laterality: N/A;  . Peripheral vascular catheterization  10/06/2015    Procedure: Peripheral Vascular Intervention;  Surgeon: Adrian Prows, MD;  Location: [place] CV LAB;  Service: Cardiovascular;;  REIA  Omnilink 6.0x29, innova 7x80  RSFA innova 5x80  . Amputation Right 10/13/2015    Procedure: Right Transmetatarsal Amputation;  Surgeon: Newt Minion, MD;  Location: [place];  Service: Orthopedics;  Laterality: Right;  . Amputation Right 11/29/2015    Procedure: RIGHT BELOW KNEE AMPUTATION;  Surgeon: Newt Minion, MD;  Location: [place];  Service: Orthopedics;  Laterality: Right;    Family History  Problem Relation Age of Onset  . Malignant hyperthermia Mother   . Malignant hyperthermia Father   . Anesthesia problems Neg Hx   . Thyroid disease Mother   . Diabetes Brother   . Heart disease Brother     CHF  . Hypertension Mother   . Hypertension Father     Social History:  reports that she has never smoked. She has never used smokeless tobacco. She reports that she does not drink alcohol or use illicit drugs.  Allergies: No Known Allergies  Medications:  I have reviewed the patient's current medications. Prior to Admission:  Prescriptions prior to admission  Medication Sig Dispense Refill Last Dose  . acetaminophen (TYLENOL) 325 MG tablet Take 2 tablets (650 mg total) by  mouth every 6 (six) hours as needed for mild pain (or Fever >/= 101). 30 tablet 0 Past Week at Unknown time  . albuterol (PROVENTIL HFA;VENTOLIN HFA) 108 (90 BASE) MCG/ACT inhaler Inhale 1 puff into the lungs every 6 (six) hours as needed for wheezing or shortness of breath.   Past Week at Unknown time  . amLODipine (NORVASC) 10 MG tablet Take 10 mg by mouth at bedtime.    11/29/2015 at Unknown time  . aspirin 81 MG EC tablet Take 1 tablet (81 mg total) by mouth daily.   12/06/2015 at Unknown time  . cilostazol (PLETAL) 100 MG tablet Take 100 mg by mouth 2 (two) times daily.   11/29/2015 at Unknown time  . cloNIDine (CATAPRES - DOSED IN MG/24 HR) 0.2 mg/24hr patch Place 0.2 mg onto the skin every Friday.   11/24/2015 at Unknown time  . enoxaparin (LOVENOX) 40 MG/0.4ML injection Inject 0.4 mLs (40 mg total) into the skin daily. 40 mL 30 Completed Course  . ferrous sulfate 325 (65 FE) MG tablet Take 1 tablet (325 mg total) by mouth 3 (three) times daily with meals.  90 tablet 3 Not Taking  . insulin aspart (NOVOLOG) 100 UNIT/ML injection Inject 4-14 Units into the skin 3 (three) times daily before meals. Per sliding scale: 100-150, 4 units. 151-200, 6 units. 201-250, 8 units. 251-300, 10 units. 301-350, 12 units. Over 350, 14 units.   Past Week at Unknown time  . insulin detemir (LEVEMIR) 100 UNIT/ML injection Inject 14-22 Units into the skin See admin instructions. Take 14 units in the morning and 22 units at bedtime.   11/29/2015 at Unknown time  . labetalol (NORMODYNE) 300 MG tablet Take 300 mg by mouth 2 (two) times daily.  3 11/29/2015 at 10am  . methocarbamol (ROBAXIN) 500 MG tablet Take 1 tablet (500 mg total) by mouth every 8 (eight) hours as needed for muscle spasms. 30 tablet 0 12/06/2015 at Unknown time  . mycophenolate (MYFORTIC) 180 MG EC tablet Take 360-540 mg by mouth 2 (two) times daily. Take three capsules (523m) in the morning and two capsules (3654m at night.   11/29/2015 at Unknown time  .  nitroGLYCERIN (NITRODUR - DOSED IN MG/24 HR) 0.2 mg/hr patch Place 1 patch (0.2 mg total) onto the skin daily. 30 patch 12 Past Week at Unknown time  . oxyCODONE-acetaminophen (ROXICET) 5-325 MG tablet Take 1 tablet by mouth every 4 (four) hours as needed for severe pain. 60 tablet 0 12/06/2015 at Unknown time  . pantoprazole (PROTONIX) 40 MG tablet Take 40 mg by mouth at bedtime.    11/29/2015 at Unknown time  . pravastatin (PRAVACHOL) 20 MG tablet Take 20 mg by mouth at bedtime.    11/28/2015 at Unknown time  . senna-docusate (SENOKOT-S) 8.6-50 MG tablet Take 1 tablet by mouth 2 (two) times daily. 30 tablet 0 Completed Course  . sulfamethoxazole-trimethoprim (BACTRIM,SEPTRA) 400-80 MG per tablet Take 1 tablet by mouth every Monday, Wednesday, and Friday.    Past Week at Unknown time  . tacrolimus (PROGRAF) 0.5 MG capsule Take 0.5 mg by mouth 2 (two) times daily. Take with 54m58mapsule to make a total of 1.5mg65m 11/29/2015 at Unknown time  . tacrolimus (PROGRAF) 1 MG capsule Take 1 mg by mouth 2 (two) times daily. Take with 0.5mg 77msule to make a total of 1.5mg. 82m/14/2017 at Unknown time  . Vitamin D, Ergocalciferol, (DRISDOL) 50000 units CAPS capsule Take 50,000 Units by mouth every Sunday.    Past Week at Unknown time  . oxyCODONE-acetaminophen (ROXICET) 5-325 MG tablet Take 1 tablet by mouth every 4 (four) hours as needed for severe pain. 60 tablet 0    Results for orders placed or performed during the hospital encounter of 12/06/15 (from the past 48 hour(s))  Sodium, urine, random     Status: None   Collection Time: 12/06/15  8:59 AM  Result Value Ref Range   Sodium, Ur <10 mmol/[name]    Comment: RESULT REPEATED AND VERIFIED  Creatinine, urine, random     Status: None   Collection Time: 12/06/15  8:59 AM  Result Value Ref Range   Creatinine, Urine 215.25 mg/dL  Protein / creatinine ratio, urine     Status: Abnormal   Collection Time: 12/06/15  9:00 AM  Result Value Ref Range   Creatinine,  Urine 216.60 mg/dL   Total Protein, Urine 70 mg/dL    Comment: NO NORMAL RANGE ESTABLISHED FOR THIS TEST   Protein Creatinine Ratio 0.32 (H) 0.00 - 0.15 mg/mg[Cre]  APTT     Status: None   Collection Time: 12/06/15  6:38 PM  Result Value Ref Range   aPTT 35 24 - 37 seconds  Comprehensive metabolic panel     Status: Abnormal   Collection Time: 12/06/15  6:38 PM  Result Value Ref Range   Sodium 134 ([name]) 135 - 145 mmol/[name]   Potassium 4.4 3.5 - 5.1 mmol/[name]   Chloride 99 ([name]) 101 - 111 mmol/[name]   CO2 25 22 - 32 mmol/[name]   Glucose, Bld 137 (H) 65 - 99 mg/dL   BUN 43 (H) 6 - 20 mg/dL   Creatinine, Ser 1.73 (H) 0.44 - 1.00 mg/dL   Calcium 8.8 ([name]) 8.9 - 10.3 mg/dL   Total Protein 5.6 ([name]) 6.5 - 8.1 g/dL   Albumin 2.2 ([name]) 3.5 - 5.0 g/dL   AST 88 (H) 15 - 41 U/[name]   ALT 113 (H) 14 - 54 U/[name]   Alkaline Phosphatase 793 (H) 38 - 126 U/[name]   Total Bilirubin 1.1 0.3 - 1.2 mg/dL   GFR calc non Af Amer 34 ([name]) >60 mL/min   GFR calc Af Amer 40 ([name]) >60 mL/min    Comment: (NOTE) The eGFR has been calculated using the CKD EPI equation. This calculation has not been validated in all clinical situations. eGFR's persistently <60 mL/min signify possible Chronic Kidney Disease.    Anion gap 10 5 - 15  CBC WITH DIFFERENTIAL     Status: Abnormal   Collection Time: 12/06/15  6:38 PM  Result Value Ref Range   WBC 4.0 4.0 - 10.5 K/uL   RBC 2.99 ([name]) 3.87 - 5.11 MIL/uL   Hemoglobin 6.8 (LL) 12.0 - 15.0 g/dL    Comment: REPEATED TO VERIFY CRITICAL RESULT CALLED TO, READ BACK BY AND VERIFIED WITH: T GOSS,RN 1900 12/06/15 D BRADLEY    HCT 22.5 ([name]) 36.0 - 46.0 %   MCV 75.3 ([name]) 78.0 - 100.0 fL   MCH 22.7 ([name]) 26.0 - 34.0 pg   MCHC 30.2 30.0 - 36.0 g/dL   RDW 17.9 (H) 11.5 - 15.5 %   Platelets 353 150 - 400 K/uL   Neutrophils Relative % 63 %   Lymphocytes Relative 18 %   Monocytes Relative 17 %   Eosinophils Relative 2 %   Basophils Relative 0 %   Neutro Abs 2.5 1.7 - 7.7 K/uL   Lymphs Abs 0.7 0.7 - 4.0 K/uL    Monocytes Absolute 0.7 0.1 - 1.0 K/uL   Eosinophils Absolute 0.1 0.0 - 0.7 K/uL   Basophils Absolute 0.0 0.0 - 0.1 K/uL   WBC Morphology MILD LEFT SHIFT (1-5% METAS, OCC MYELO, OCC BANDS)     Comment: DOHLE BODIES   Smear Review LARGE PLATELETS PRESENT   Protime-INR     Status: Abnormal   Collection Time: 12/06/15  6:38 PM  Result Value Ref Range   Prothrombin Time 18.2 (H) 11.6 - 15.2 seconds   INR 1.50 (H) 0.00 - 1.49  Type and screen     Status: None (Preliminary result)   Collection Time: 12/06/15  6:38 PM  Result Value Ref Range   ABO/RH(D) A POS    Antibody Screen NEG    Sample Expiration 12/09/2015    Unit Number [id number]    Blood Component Type RED CELLS,LR    Unit division 00    Status of Unit ISSUED,FINAL    Transfusion Status OK TO TRANSFUSE    Crossmatch Result Compatible    Unit Number [id number]    Blood Component Type RED CELLS,LR    Unit division 00  Status of Unit ALLOCATED    Transfusion Status OK TO TRANSFUSE    Crossmatch Result Compatible   Ammonia     Status: None   Collection Time: 12/06/15  6:38 PM  Result Value Ref Range   Ammonia 21 9 - 35 umol/[name]  Brain natriuretic peptide     Status: Abnormal   Collection Time: 12/06/15  6:38 PM  Result Value Ref Range   B Natriuretic Peptide 221.9 (H) 0.0 - 100.0 pg/mL  I-stat troponin, ED     Status: None   Collection Time: 12/06/15  6:59 PM  Result Value Ref Range   Troponin i, poc 0.00 0.00 - 0.08 ng/mL   Comment 3            Comment: Due to the release kinetics of cTnI, a negative result within the first hours of the onset of symptoms does not rule out myocardial infarction with certainty. If myocardial infarction is still suspected, repeat the test at appropriate intervals.   Urinalysis, Routine w reflex microscopic (not at [facility])     Status: Abnormal   Collection Time: 12/06/15  7:08 PM  Result Value Ref Range   Color, Urine RED (A) YELLOW    Comment: BIOCHEMICALS MAY BE AFFECTED BY COLOR    APPearance CLOUDY (A) CLEAR   Specific Gravity, Urine 1.022 1.005 - 1.030   pH 5.0 5.0 - 8.0   Glucose, UA NEGATIVE NEGATIVE mg/dL   Hgb urine dipstick LARGE (A) NEGATIVE   Bilirubin Urine LARGE (A) NEGATIVE   Ketones, ur 15 (A) NEGATIVE mg/dL   Protein, ur NEGATIVE NEGATIVE mg/dL   Nitrite NEGATIVE NEGATIVE   Leukocytes, UA SMALL (A) NEGATIVE  Urine microscopic-add on     Status: Abnormal   Collection Time: 12/06/15  7:08 PM  Result Value Ref Range   Squamous Epithelial / LPF 0-5 (A) NONE SEEN   WBC, UA 6-30 0 - 5 WBC/hpf   RBC / HPF 6-30 0 - 5 RBC/hpf   Bacteria, UA MANY (A) NONE SEEN   Casts HYALINE CASTS (A) NEGATIVE  Prepare RBC     Status: None   Collection Time: 12/06/15  7:39 PM  Result Value Ref Range   Order Confirmation ORDER PROCESSED BY BLOOD BANK   I-Stat Arterial Blood Gas, ED - (order at [facility] and [facility] only)     Status: Abnormal   Collection Time: 12/06/15  9:04 PM  Result Value Ref Range   pH, Arterial 7.382 7.350 - 7.450   pCO2 arterial 41.8 35.0 - 45.0 mmHg   pO2, Arterial 53.0 ([name]) 80.0 - 100.0 mmHg   Bicarbonate 24.8 (H) 20.0 - 24.0 mEq/[name]   TCO2 26 0 - 100 mmol/[name]   O2 Saturation 86.0 %   Patient temperature 98.6 F    Collection site RADIAL, ALLEN'S TEST ACCEPTABLE    Drawn by RT    Sample type ARTERIAL   Glucose, capillary     Status: Abnormal   Collection Time: 12/06/15 10:11 PM  Result Value Ref Range   Glucose-Capillary 125 (H) 65 - 99 mg/dL  Comprehensive metabolic panel     Status: Abnormal   Collection Time: 12/07/15  2:05 AM  Result Value Ref Range   Sodium 133 ([name]) 135 - 145 mmol/[name]   Potassium 4.0 3.5 - 5.1 mmol/[name]   Chloride 98 ([name]) 101 - 111 mmol/[name]   CO2 24 22 - 32 mmol/[name]   Glucose, Bld 141 (H) 65 - 99 mg/dL   BUN 44 (H) 6 -  20 mg/dL   Creatinine, Ser 1.60 (H) 0.44 - 1.00 mg/dL   Calcium 8.6 ([name]) 8.9 - 10.3 mg/dL   Total Protein 5.7 ([name]) 6.5 - 8.1 g/dL   Albumin 2.3 ([name]) 3.5 - 5.0 g/dL   AST 62 (H) 15 - 41 U/[name]   ALT 98 (H) 14 - 54 U/[name]    Alkaline Phosphatase 754 (H) 38 - 126 U/[name]   Total Bilirubin 1.7 (H) 0.3 - 1.2 mg/dL   GFR calc non Af Amer 38 ([name]) >60 mL/min   GFR calc Af Amer 44 ([name]) >60 mL/min    Comment: (NOTE) The eGFR has been calculated using the CKD EPI equation. This calculation has not been validated in all clinical situations. eGFR's persistently <60 mL/min signify possible Chronic Kidney Disease.    Anion gap 11 5 - 15  CBC     Status: Abnormal   Collection Time: 12/07/15  2:05 AM  Result Value Ref Range   WBC 4.3 4.0 - 10.5 K/uL   RBC 3.45 ([name]) 3.87 - 5.11 MIL/uL   Hemoglobin 8.3 ([name]) 12.0 - 15.0 g/dL   HCT 26.0 ([name]) 36.0 - 46.0 %   MCV 75.4 ([name]) 78.0 - 100.0 fL   MCH 24.1 ([name]) 26.0 - 34.0 pg   MCHC 31.9 30.0 - 36.0 g/dL   RDW 17.8 (H) 11.5 - 15.5 %   Platelets 311 150 - 400 K/uL  D-dimer, quantitative (not at [facility])     Status: Abnormal   Collection Time: 12/07/15  2:05 AM  Result Value Ref Range   D-Dimer, Quant 3.78 (H) 0.00 - 0.50 ug/mL-FEU    Comment: (NOTE) At the manufacturer cut-off of 0.50 ug/mL FEU, this assay has been documented to exclude PE with a sensitivity and negative predictive value of 97 to 99%.  At this time, this assay has not been approved by the FDA to exclude DVT/VTE. Results should be correlated with clinical presentation.   Glucose, capillary     Status: Abnormal   Collection Time: 12/07/15 12:04 PM  Result Value Ref Range   Glucose-Capillary 119 (H) 65 - 99 mg/dL    Dg Chest 2 View  12/06/2015  CLINICAL DATA:  Fatigue, weakness, poor appetite. Decreased O2 sats. EXAM: CHEST  2 VIEW COMPARISON:  10/14/2015 FINDINGS: Cardiomegaly. No overt edema. Lingular and right basilar atelectasis. No effusions. No acute bony abnormality. IMPRESSION: Bibasilar atelectasis.  Cardiomegaly. Electronically Signed   By: Rolm Baptise M.D.   On: 12/06/2015 19:40   Ct Head Wo Contrast  12/06/2015  CLINICAL DATA:  Per [facility], pt from home. Pt had R leg BKA on the 14th, released yesterday.  Everything has been good, when she got home, c/o generalized weakness, altered, abnormal behavior, incontinence per family. o2 sats room air 87% per fire, pt placed on [place], Sats 97%. VSS. HR 106, BP 130/60. Pt is AAOX4. Denies pain medicine. Pt weak, poor appetite. Pt taken prescribed pain medicine. EXAM: CT HEAD WITHOUT CONTRAST TECHNIQUE: Contiguous axial images were obtained from the base of the skull through the vertex without intravenous contrast. COMPARISON:  03/29/2011 FINDINGS: The ventricles are normal in size and configuration. There are no parenchymal masses or mass effect, no evidence of an infarct, no extra-axial masses or abnormal fluid collections and no intracranial hemorrhage. There is a small amount of dependent fluid in the left sphenoid sinus. There is mild sinus mucosal thickening involving all sinuses. The mastoid air cells are mostly opacified bilaterally. No skull lesion. IMPRESSION: 1. No intracranial  abnormality. 2. Sinus mucosal thickening, mild. Bilateral mastoid air cell opacification, likely effusions. Electronically Signed   By: Lajean Manes M.D.   On: 12/06/2015 19:21   US Abdomen Complete  12/06/2015  CLINICAL DATA:  Acute onset of elevated LFTs. Generalized abdominal pain. Initial encounter. EXAM: ABDOMEN ULTRASOUND COMPLETE COMPARISON:  CT of the abdomen and pelvis from 04/02/2015, and renal ultrasound performed 03/31/2011 FINDINGS: Gallbladder: The gallbladder is filled with small stones and sludge, with significant surrounding pericholecystic fluid and a positive ultrasonographic Murphy's sign. Gallbladder wall thickening is noted. Findings are concerning for acute cholecystitis. Common bile duct: Diameter: 0.6 cm, within normal limits in caliber. Liver: No focal lesion identified. Within normal limits in parenchymal echogenicity. IVC: No abnormality visualized. Pancreas: Visualized portion unremarkable. Spleen: Size and appearance within normal limits. Right Kidney: Length:  7.1 cm. Diffusely increased parenchymal echogenicity. Moderate right renal atrophy is noted. No mass or hydronephrosis visualized. Left Kidney: Length: 7.0 cm. Diffusely increased parenchymal echogenicity. Moderate left renal atrophy is noted. No mass or hydronephrosis visualized. Abdominal aorta: No aneurysm visualized. Other findings: The patient's right iliac fossa transplant kidney is unremarkable in appearance, measuring 12.0 cm in length, with a single small 1.1 cm cyst. IMPRESSION: 1. Stones and sludge filling the gallbladder, with significant surrounding pericholecystic fluid, gallbladder wall thickening and a positive ultrasonographic Murphy's sign. Findings are concerning for acute cholecystitis. 2. Right iliac fossa transplant kidney is unremarkable in appearance, with a small renal cyst. Moderate atrophy of the native kidneys. Electronically Signed   By: Garald Balding M.D.   On: 12/06/2015 23:32    ROS Blood pressure 147/63, pulse 95, temperature 98 F (36.7 C), temperature source Oral, resp. rate 18, last menstrual period 09/05/2015, SpO2 95 %. Physical Exam Physical Examination: General appearance - chronically ill appearing Mental status - slowed mentation but oriented Eyes - DM retinopathy Mouth - mucous membranes moist, pharynx normal without lesions Neck - adenopathy noted PCL Lymphatics - posterior cervical nodes Chest - clear to auscultation, no wheezes, rales or rhonchi, symmetric air entry Heart - S1 and S2 normal, systolic murmur SK8/7 at 2nd left intercostal space Abdomen - diffuse tender,pos bs. TX RLQ Musculoskeletal - RBKA Extremities - as above and AVF RUA Skin - pale  Assessment/Plan: 1 AKI mild.  Suspect not new and going on several days.  Low FENA, mild vol decrease.  Need to check Prograf, CK, and cont fluids. 2 ESRD: Renal TX 3 Hypertension: not on Clonidine normally, ??contrib to MS.  Will get off soon 4. Anemia eval.  Secondary to blood loss with op and not  making 5. Metabolic Bone Disease: not an issue 6 PVD post BKA 7 Cholecystitis needs out P ivf,stop clonidine, prograf level, check Ck  [name] 12/07/2015, 2:26 PM

## 2015-12-07 NOTE — Anesthesia Preprocedure Evaluation (Addendum)
Anesthesia Evaluation  Patient identified by MRN, date of birth, ID band Patient awake    Reviewed: Allergy & Precautions, H&P , NPO status , Patient's Chart, lab work & pertinent test results  Airway Mallampati: III  TM Distance: >3 FB Neck ROM: Full    Dental no notable dental hx. (+) Teeth Intact, Dental Advisory Given   Pulmonary neg pulmonary ROS,    Pulmonary exam normal breath sounds clear to auscultation       Cardiovascular hypertension, Pt. on medications + Peripheral Vascular Disease and +CHF   Rhythm:Regular Rate:Normal     Neuro/Psych negative neurological ROS  negative psych ROS   GI/Hepatic Neg liver ROS, PUD, GERD  Medicated and Controlled,  Endo/Other  diabetes, Type 1, Insulin Dependent  Renal/GU Renal diseaseS/p Kidney transplant  negative genitourinary   Musculoskeletal   Abdominal   Peds  Hematology negative hematology ROS (+) anemia ,   Anesthesia Other Findings   Reproductive/Obstetrics negative OB ROS                            Anesthesia Physical Anesthesia Plan  ASA: III  Anesthesia Plan: General   Post-op Pain Management:    Induction: Intravenous  Airway Management Planned: Oral ETT  Additional Equipment:   Intra-op Plan:   Post-operative Plan: Extubation in OR  Informed Consent: I have reviewed the patients History and Physical, chart, labs and discussed the procedure including the risks, benefits and alternatives for the proposed anesthesia with the patient or authorized representative who has indicated his/her understanding and acceptance.   Dental advisory given  Plan Discussed with: CRNA  Anesthesia Plan Comments:         Anesthesia Quick Evaluation

## 2015-12-08 ENCOUNTER — Observation Stay (HOSPITAL_COMMUNITY): Payer: Medicare Other | Admitting: Anesthesiology

## 2015-12-08 ENCOUNTER — Observation Stay (HOSPITAL_COMMUNITY): Payer: Medicare Other

## 2015-12-08 ENCOUNTER — Encounter (HOSPITAL_COMMUNITY)
Admission: EM | Disposition: A | Discharge status: Home Health | Payer: Self-pay | Source: Home / Self Care | Attending: Family Medicine

## 2015-12-08 ENCOUNTER — Encounter (HOSPITAL_COMMUNITY): Payer: Self-pay | Admitting: Anesthesiology

## 2015-12-08 DIAGNOSIS — I509 Heart failure, unspecified: Secondary | ICD-10-CM | POA: Diagnosis not present

## 2015-12-08 DIAGNOSIS — D638 Anemia in other chronic diseases classified elsewhere: Secondary | ICD-10-CM | POA: Diagnosis not present

## 2015-12-08 DIAGNOSIS — K810 Acute cholecystitis: Secondary | ICD-10-CM | POA: Diagnosis not present

## 2015-12-08 DIAGNOSIS — N2581 Secondary hyperparathyroidism of renal origin: Secondary | ICD-10-CM | POA: Diagnosis not present

## 2015-12-08 DIAGNOSIS — I739 Peripheral vascular disease, unspecified: Secondary | ICD-10-CM | POA: Diagnosis not present

## 2015-12-08 DIAGNOSIS — N186 End stage renal disease: Secondary | ICD-10-CM | POA: Diagnosis not present

## 2015-12-08 DIAGNOSIS — K8000 Calculus of gallbladder with acute cholecystitis without obstruction: Secondary | ICD-10-CM | POA: Diagnosis not present

## 2015-12-08 DIAGNOSIS — E43 Unspecified severe protein-calorie malnutrition: Secondary | ICD-10-CM | POA: Diagnosis not present

## 2015-12-08 DIAGNOSIS — N179 Acute kidney failure, unspecified: Secondary | ICD-10-CM | POA: Diagnosis not present

## 2015-12-08 DIAGNOSIS — J9601 Acute respiratory failure with hypoxia: Secondary | ICD-10-CM | POA: Diagnosis not present

## 2015-12-08 DIAGNOSIS — K8020 Calculus of gallbladder without cholecystitis without obstruction: Secondary | ICD-10-CM | POA: Diagnosis not present

## 2015-12-08 DIAGNOSIS — Z940 Kidney transplant status: Secondary | ICD-10-CM | POA: Diagnosis not present

## 2015-12-08 DIAGNOSIS — E1151 Type 2 diabetes mellitus with diabetic peripheral angiopathy without gangrene: Secondary | ICD-10-CM | POA: Diagnosis not present

## 2015-12-08 DIAGNOSIS — Z01810 Encounter for preprocedural cardiovascular examination: Secondary | ICD-10-CM | POA: Diagnosis not present

## 2015-12-08 DIAGNOSIS — D62 Acute posthemorrhagic anemia: Secondary | ICD-10-CM | POA: Diagnosis not present

## 2015-12-08 DIAGNOSIS — Z992 Dependence on renal dialysis: Secondary | ICD-10-CM | POA: Diagnosis not present

## 2015-12-08 DIAGNOSIS — E1165 Type 2 diabetes mellitus with hyperglycemia: Secondary | ICD-10-CM | POA: Diagnosis not present

## 2015-12-08 DIAGNOSIS — N189 Chronic kidney disease, unspecified: Secondary | ICD-10-CM | POA: Diagnosis not present

## 2015-12-08 DIAGNOSIS — I120 Hypertensive chronic kidney disease with stage 5 chronic kidney disease or end stage renal disease: Secondary | ICD-10-CM | POA: Diagnosis not present

## 2015-12-08 HISTORY — PX: CHOLECYSTECTOMY: SHX55

## 2015-12-08 LAB — COMPREHENSIVE METABOLIC PANEL
ALT: 62 U/L — ABNORMAL HIGH (ref 14–54)
AST: 29 U/L (ref 15–41)
Albumin: 2.1 g/dL — ABNORMAL LOW (ref 3.5–5.0)
Alkaline Phosphatase: 751 U/L — ABNORMAL HIGH (ref 38–126)
Anion gap: 6 (ref 5–15)
BUN: 29 mg/dL — ABNORMAL HIGH (ref 6–20)
CO2: 23 mmol/L (ref 22–32)
Calcium: 8.5 mg/dL — ABNORMAL LOW (ref 8.9–10.3)
Chloride: 108 mmol/L (ref 101–111)
Creatinine, Ser: 0.95 mg/dL (ref 0.44–1.00)
GFR calc Af Amer: >60 mL/min (ref >60)
GFR calc non Af Amer: >60 mL/min (ref >60)
Glucose, Bld: 79 mg/dL (ref 65–99)
Potassium: 3.1 mmol/L — ABNORMAL LOW (ref 3.5–5.1)
Sodium: 137 mmol/L (ref 135–145)
Total Bilirubin: 0.4 mg/dL (ref 0.3–1.2)
Total Protein: 5.5 g/dL — ABNORMAL LOW (ref 6.5–8.1)

## 2015-12-08 LAB — CBC WITH DIFFERENTIAL/PLATELET
Basophils Absolute: 0 10*3/uL (ref 0.0–0.1)
Basophils Relative: 0 %
Eosinophils Absolute: 0.3 10*3/uL (ref 0.0–0.7)
Eosinophils Relative: 7 %
HCT: 27.4 % — ABNORMAL LOW (ref 36.0–46.0)
Hemoglobin: 8.8 g/dL — ABNORMAL LOW (ref 12.0–15.0)
Lymphocytes Relative: 26 %
Lymphs Abs: 1.1 10*3/uL (ref 0.7–4.0)
MCH: 24.6 pg — ABNORMAL LOW (ref 26.0–34.0)
MCHC: 32.1 g/dL (ref 30.0–36.0)
MCV: 76.8 fL — ABNORMAL LOW (ref 78.0–100.0)
Monocytes Absolute: 0.7 10*3/uL (ref 0.1–1.0)
Monocytes Relative: 17 %
Neutro Abs: 2 10*3/uL (ref 1.7–7.7)
Neutrophils Relative %: 50 %
Platelets: 353 10*3/uL (ref 150–400)
RBC: 3.57 MIL/uL — ABNORMAL LOW (ref 3.87–5.11)
RDW: 18 % — ABNORMAL HIGH (ref 11.5–15.5)
WBC: 4.1 10*3/uL (ref 4.0–10.5)

## 2015-12-08 LAB — GLUCOSE, CAPILLARY
Glucose-Capillary: 97 mg/dL (ref 65–99)
Glucose-Capillary: 135 mg/dL — ABNORMAL HIGH (ref 65–99)
Glucose-Capillary: 161 mg/dL — ABNORMAL HIGH (ref 65–99)
Glucose-Capillary: 152 mg/dL — ABNORMAL HIGH (ref 65–99)
Glucose-Capillary: 146 mg/dL — ABNORMAL HIGH (ref 65–99)

## 2015-12-08 LAB — SURGICAL PCR SCREEN
MRSA, PCR: NEGATIVE
Staphylococcus aureus: NEGATIVE

## 2015-12-08 LAB — PROTIME-INR
INR: 1.34 (ref 0.00–1.49)
Prothrombin Time: 16.7 seconds — ABNORMAL HIGH (ref 11.6–15.2)

## 2015-12-08 LAB — MAGNESIUM: Magnesium: 2.5 mg/dL — ABNORMAL HIGH (ref 1.7–2.4)

## 2015-12-08 LAB — TACROLIMUS LEVEL: Tacrolimus (FK506) - LabCorp: 5.9 ng/mL (ref 2.0–20.0)

## 2015-12-08 LAB — PHOSPHORUS: Phosphorus: 3.6 mg/dL (ref 2.5–4.6)

## 2015-12-08 SURGERY — LAPAROSCOPIC CHOLECYSTECTOMY WITH INTRAOPERATIVE CHOLANGIOGRAM
Anesthesia: General | Site: Abdomen

## 2015-12-08 MED ORDER — SODIUM CHLORIDE 0.9 % IV SOLN
INTRAVENOUS | Status: DC | PRN
  Administered 2015-12-08: 15 mL

## 2015-12-08 MED ORDER — HYDROMORPHONE HCL 1 MG/ML IJ SOLN: 0.5000 mg | INTRAMUSCULAR | Status: DC | PRN

## 2015-12-08 MED ORDER — MIDAZOLAM HCL 2 MG/2ML IJ SOLN
INTRAMUSCULAR | Status: AC
Start: 2015-12-08 — End: 2015-12-08
  Filled 2015-12-08: qty 2

## 2015-12-08 MED ORDER — ROCURONIUM BROMIDE 100 MG/10ML IV SOLN
INTRAVENOUS | Status: DC | PRN
  Administered 2015-12-08: 40 mg via INTRAVENOUS
  Administered 2015-12-08 (×2): 10 mg via INTRAVENOUS

## 2015-12-08 MED ORDER — LIDOCAINE HCL (PF) 1 % IJ SOLN
INTRAMUSCULAR | Status: AC
  Filled 2015-12-08: qty 30

## 2015-12-08 MED ORDER — FENTANYL CITRATE (PF) 250 MCG/5ML IJ SOLN
INTRAMUSCULAR | Status: DC | PRN
  Administered 2015-12-08 (×5): 50 ug via INTRAVENOUS

## 2015-12-08 MED ORDER — PROPOFOL 10 MG/ML IV BOLUS
INTRAVENOUS | Status: DC | PRN
  Administered 2015-12-08: 90 mg via INTRAVENOUS

## 2015-12-08 MED ORDER — SODIUM CHLORIDE 0.9 % IV SOLN
INTRAVENOUS | Status: DC | PRN
  Administered 2015-12-08: 09:00:00 via INTRAVENOUS

## 2015-12-08 MED ORDER — SUGAMMADEX SODIUM 200 MG/2ML IV SOLN
INTRAVENOUS | Status: DC | PRN
  Administered 2015-12-08: 250 mg via INTRAVENOUS

## 2015-12-08 MED ORDER — HYDRALAZINE HCL 20 MG/ML IJ SOLN
20.0000 mg | Freq: Four times a day (QID) | INTRAMUSCULAR | Status: DC | PRN
  Administered 2015-12-08: 20 mg via INTRAVENOUS
  Filled 2015-12-08: qty 1

## 2015-12-08 MED ORDER — 0.9 % SODIUM CHLORIDE (POUR BTL) OPTIME
TOPICAL | Status: DC | PRN
  Administered 2015-12-08: 1000 mL

## 2015-12-08 MED ORDER — FENTANYL CITRATE (PF) 250 MCG/5ML IJ SOLN
INTRAMUSCULAR | Status: AC
  Filled 2015-12-08: qty 5

## 2015-12-08 MED ORDER — BUPIVACAINE-EPINEPHRINE (PF) 0.25% -1:200000 IJ SOLN
INTRAMUSCULAR | Status: DC | PRN
  Administered 2015-12-08: 10 mL

## 2015-12-08 MED ORDER — SODIUM CHLORIDE 0.9 % IV SOLN
INTRAVENOUS | Status: DC
  Administered 2015-12-08: 08:00:00 via INTRAVENOUS

## 2015-12-08 MED ORDER — HYDRALAZINE HCL 20 MG/ML IJ SOLN
10.0000 mg | INTRAMUSCULAR | Status: DC | PRN
  Administered 2015-12-08: 10 mg via INTRAVENOUS
  Filled 2015-12-08 (×2): qty 1

## 2015-12-08 MED ORDER — SODIUM CHLORIDE 0.9 % IR SOLN
Status: DC | PRN
Start: 2015-12-08 — End: 2015-12-08
  Administered 2015-12-08 (×2): 1000 mL

## 2015-12-08 MED ORDER — LIDOCAINE HCL (CARDIAC) 20 MG/ML IV SOLN
INTRAVENOUS | Status: DC | PRN
  Administered 2015-12-08: 60 mg via INTRATRACHEAL

## 2015-12-08 MED ORDER — MIDAZOLAM HCL 5 MG/5ML IJ SOLN
INTRAMUSCULAR | Status: DC | PRN
  Administered 2015-12-08 (×2): 1 mg via INTRAVENOUS

## 2015-12-08 MED ORDER — ONDANSETRON HCL 4 MG/2ML IJ SOLN
INTRAMUSCULAR | Status: DC | PRN
  Administered 2015-12-08: 4 mg via INTRAVENOUS

## 2015-12-08 MED ORDER — POTASSIUM CHLORIDE CRYS ER 20 MEQ PO TBCR
40.0000 meq | EXTENDED_RELEASE_TABLET | ORAL | Status: AC
  Administered 2015-12-08: 40 meq via ORAL
  Filled 2015-12-08: qty 2

## 2015-12-08 MED ORDER — BUPIVACAINE-EPINEPHRINE (PF) 0.25% -1:200000 IJ SOLN
INTRAMUSCULAR | Status: AC
  Filled 2015-12-08: qty 30

## 2015-12-08 MED ORDER — HEMOSTATIC AGENTS (NO CHARGE) OPTIME
TOPICAL | Status: DC | PRN
  Administered 2015-12-08: 1 via TOPICAL

## 2015-12-08 MED ORDER — HYDROMORPHONE HCL 1 MG/ML IJ SOLN: 0.2500 mg | INTRAMUSCULAR | Status: DC | PRN

## 2015-12-08 MED ORDER — IOPAMIDOL (ISOVUE-300) INJECTION 61%
INTRAVENOUS | Status: AC
  Filled 2015-12-08: qty 50

## 2015-12-08 MED ORDER — CEFAZOLIN SODIUM-DEXTROSE 2-3 GM-% IV SOLR
INTRAVENOUS | Status: DC | PRN
  Administered 2015-12-08: 2 g via INTRAVENOUS

## 2015-12-08 MED ORDER — PROPOFOL 10 MG/ML IV BOLUS
INTRAVENOUS | Status: AC
  Filled 2015-12-08: qty 40

## 2015-12-08 SURGICAL SUPPLY — 53 items
ADH SKN CLS APL DERMABOND .7 (GAUZE/BANDAGES/DRESSINGS) ×1
APPLIER CLIP ROT 10 11.4 M/L (STAPLE) ×2
APR CLP MED LRG 11.4X10 (STAPLE) ×1
BAG SPEC RTRVL LRG 6X4 10 (ENDOMECHANICALS) ×1
BLADE SURG ROTATE 9660 (MISCELLANEOUS) IMPLANT
CANISTER SUCTION 2500CC (MISCELLANEOUS) ×2 IMPLANT
CHLORAPREP W/TINT 26ML (MISCELLANEOUS) ×2 IMPLANT
CLIP APPLIE ROT 10 11.4 M/L (STAPLE) ×1 IMPLANT
COVER MAYO STAND STRL (DRAPES) ×3 IMPLANT
COVER SURGICAL LIGHT HANDLE (MISCELLANEOUS) ×2 IMPLANT
DERMABOND ADVANCED (GAUZE/BANDAGES/DRESSINGS) ×1
DERMABOND ADVANCED .7 DNX12 (GAUZE/BANDAGES/DRESSINGS) ×1 IMPLANT
DRAPE C-ARM 42X72 X-RAY (DRAPES) ×2 IMPLANT
DRAPE WARM FLUID 44X44 (DRAPE) ×1 IMPLANT
ELECT REM PT RETURN 9FT ADLT (ELECTROSURGICAL) ×2
ELECTRODE REM PT RTRN 9FT ADLT (ELECTROSURGICAL) ×1 IMPLANT
FILTER SMOKE EVAC LAPAROSHD (FILTER) ×1 IMPLANT
GLOVE BIO SURGEON STRL SZ 6 (GLOVE) ×3 IMPLANT
GLOVE BIO SURGEON STRL SZ 6.5 (GLOVE) ×1 IMPLANT
GLOVE BIOGEL PI IND STRL 6.5 (GLOVE) ×1 IMPLANT
GLOVE BIOGEL PI IND STRL 7.0 (GLOVE) IMPLANT
GLOVE BIOGEL PI IND STRL 7.5 (GLOVE) IMPLANT
GLOVE BIOGEL PI INDICATOR 6.5 (GLOVE) ×1
GLOVE BIOGEL PI INDICATOR 7.0 (GLOVE)
GLOVE BIOGEL PI INDICATOR 7.5 (GLOVE) ×1
GLOVE ECLIPSE 7.0 STRL STRAW (GLOVE) ×1 IMPLANT
GLOVE ECLIPSE 7.5 STRL STRAW (GLOVE) ×1 IMPLANT
GLOVE INDICATOR 6.5 STRL GRN (GLOVE) ×1 IMPLANT
GOWN STRL REUS W/ TWL LRG LVL3 (GOWN DISPOSABLE) ×2 IMPLANT
GOWN STRL REUS W/TWL 2XL LVL3 (GOWN DISPOSABLE) ×2 IMPLANT
GOWN STRL REUS W/TWL LRG LVL3 (GOWN DISPOSABLE) ×4
HEMOSTAT SNOW SURGICEL 2X4 (HEMOSTASIS) ×1 IMPLANT
KIT BASIN OR (CUSTOM PROCEDURE TRAY) ×2 IMPLANT
KIT ROOM TURNOVER OR (KITS) ×2 IMPLANT
L-HOOK LAP DISP 36CM (ELECTROSURGICAL) ×2
LHOOK LAP DISP 36CM (ELECTROSURGICAL) ×1 IMPLANT
NS IRRIG 1000ML POUR BTL (IV SOLUTION) ×2 IMPLANT
PAD ARMBOARD 7.5X6 YLW CONV (MISCELLANEOUS) ×2 IMPLANT
PENCIL BUTTON HOLSTER BLD 10FT (ELECTRODE) ×2 IMPLANT
POUCH SPECIMEN RETRIEVAL 10MM (ENDOMECHANICALS) ×2 IMPLANT
SCISSORS LAP 5X35 DISP (ENDOMECHANICALS) ×2 IMPLANT
SET CHOLANGIOGRAPH 5 50 .035 (SET/KITS/TRAYS/PACK) ×2 IMPLANT
SET IRRIG TUBING LAPAROSCOPIC (IRRIGATION / IRRIGATOR) ×2 IMPLANT
SLEEVE ENDOPATH XCEL 5M (ENDOMECHANICALS) ×2 IMPLANT
SPECIMEN JAR SMALL (MISCELLANEOUS) ×2 IMPLANT
SUT MNCRL AB 4-0 PS2 18 (SUTURE) ×2 IMPLANT
TOWEL OR 17X24 6PK STRL BLUE (TOWEL DISPOSABLE) ×1 IMPLANT
TOWEL OR 17X26 10 PK STRL BLUE (TOWEL DISPOSABLE) ×2 IMPLANT
TRAY LAPAROSCOPIC MC (CUSTOM PROCEDURE TRAY) ×2 IMPLANT
TROCAR XCEL BLUNT TIP 100MML (ENDOMECHANICALS) ×2 IMPLANT
TROCAR XCEL NON-BLD 11X100MML (ENDOMECHANICALS) ×2 IMPLANT
TROCAR XCEL NON-BLD 5MMX100MML (ENDOMECHANICALS) ×2 IMPLANT
TUBING INSUFFLATION (TUBING) ×2 IMPLANT

## 2015-12-08 NOTE — Transfer of Care (Signed)
Immediate Anesthesia Transfer of Care Note  Patient: [name]  Procedure(s) Performed: Procedure(s): LAPAROSCOPIC CHOLECYSTECTOMY WITH INTRAOPERATIVE CHOLANGIOGRAM (N/A)  Patient Location: PACU  Anesthesia Type:General  Level of Consciousness: sedated  Airway & Oxygen Therapy: Patient Spontanous Breathing and Patient connected to face mask oxygen  Post-op Assessment: Report given to RN and Post -op Vital signs reviewed and stable  Post vital signs: Reviewed and stable  Last Vitals:  Filed Vitals:   12/08/15 0453 12/08/15 0758  BP: 158/65 138/66  Pulse: 88   Temp: 36.8 C   Resp: 18     Last Pain:  Filed Vitals:   12/08/15 0801  PainSc: 0-No pain      Patients Stated Pain Goal: 0 ([id number])  Complications: No apparent anesthesia complications

## 2015-12-08 NOTE — Progress Notes (Signed)
[facility] Progress Note  Patient: [name] [id number]   PCP: Benito Mccreedy, MD DOB: 09/17/1970   DOA: 12/06/2015   DOS: 12/08/2015   Date of Service: the patient was seen and examined on 12/08/2015  Subjective: Post-op cholecystectomy. Patient is resting comfortably.  Nutrition: Clear liquid diet  Brief hospital course: Pt. with PMH of ESRD s/p renal transplant in 2014, IDDM, PVD with BKA on 11/29/2015, and anemia secondary to CKD ; admitted on 12/06/2015, with complaint of weakness, confusion, upper abdominal pain, nausea, vomiting, and diarrhea. Was found to have a Hb of 6.8 and transfused 1 unit of PRBC. Elevated LFTs. U/S confirms cholecystitis. Urine shows UTI and started of ceftriaxone. Consulted [facility] transplant, [facility] will manage care. Cholecystectomy preformed 12/08/2015.  Currently further plan is to continue current management and monitor symptoms.  Assessment and Plan: Acute Cholecystitis: RUQ U/s shows gallbladder thickening with sludge. No dilatation of the bile ducts LFTs are elevated, Total bilirubin 1.7, PT 18.2, INR 1.5 Placed on IV ceftriaxone and IVFs, currently NPO. General surgery preformed cholecystectomy today. Will monitor for symptoms.     Acute blood loss anemia on chronic anemia of CKD:  Most likely Secondary to recent BKA on 6/14  Transfused total of 1 unit of PRBC this hospitilization Today Hb 8.8 pre-op Continue to monitor with CBC Continue home iron supplementation FOBT awaiting   Acute kidney injury, resolved Baseline Cr 1.0  Kidney transplant in 2014, on Prograft, mycophenolate and Bactrim. Will hold medications for possible toxicity and in the setting on ongoing infection. Prograft level pending. Transplant surgery at [facility] recommended pt can continue to have her nephrology care here. Consulted Nephrology, IV fluids now stopped.  Acute Hypoxia 93% spo2 on ED presentation. Placed on 2 L O2 nasal cannula. Not on O2 at  baseline. 95% spo2 today on 2 L. BNP is elevated but currently has AKI, does not appear clinically volume overloaded. ECHO on 10/14/15 shows EF of 65-70% D-dimer is 3.78 will order V/Q scan for pre-op risk assessment   UTI: Pyuria and bacteriuria with recent hospitalization. immunosupressed  Urine culture insignificant quantities of gram negative rods. Continue Ceftriaxone 1 g daily  IDDM: Continue with Levemir 10 units in BID with sliding scale corrections. On 14 and 22 units at home. Continue to monitor CBG  Peripheral vascular disease/ HTN: Continue amlodipine, clonidine, labetalol, aspirin Continue to hold statin  PRN hydralazine.  GERD Continue PPI  Severe protein calorie malnutrition:  Ensure daily  Weakness and altered mental status.  Likely secondary to cholecystitis or UTI.  Resolved   Recent BKA right. May continue Robaxin and Percocet PRN Duda's note states "incision is clean minimal drainage, continue prosthetic shrinker, f/u in my office in 1 week" Will continue to monitor during hospital stay.  Diarrhea  Patient 3 lose watery BM. Probably from Senokot, discontinued.  Pain management: When necessary Percocet, when necessary Dilaudid Activity: Consulted physical therapy Bowel regimen: last BM 12/07/2015 Diet: Cardiac diet DVT Prophylaxis: subcutaneous Heparin  Advance goals of care discussion: Full code  Family Communication: family was present at bedside, at the time of interview. The pt provided permission to discuss medical plan with the family. Opportunity was given to ask question and all questions were answered satisfactorily.   Disposition:  Discharge to home. Expected discharge date: 12/10/2015, postoperative recovery  Consultants: Nephrology, general surgery Procedures: Laparoscopic cholecystectomy  Antibiotics: Anti-infectives    Start     Dose/Rate Route Frequency Ordered Stop   12/08/15 1000  sulfamethoxazole-trimethoprim  (  BACTRIM,SEPTRA) 400-80 MG per tablet 1 tablet  Status:  Discontinued     1 tablet Oral Every M-W-F 12/06/15 2220 12/07/15 1335   12/06/15 2300  cefTRIAXone (ROCEPHIN) 1 g in dextrose 5 % 50 mL IVPB     1 g 100 mL/hr over 30 Minutes Intravenous Every 24 hours 12/06/15 2222          Intake/Output Summary (Last 24 hours) at 12/08/15 1510 Last data filed at 12/08/15 1413  Gross per 24 hour  Intake 1496.25 ml  Output    275 ml  Net 1221.25 ml   There were no vitals filed for this visit.  Objective: Physical Exam: Filed Vitals:   12/08/15 1148 12/08/15 1201 12/08/15 1235 12/08/15 1241  BP: 126/60 126/61 144/58 144/62  Pulse: 81 81 83 87  Temp:   97.9 F (36.6 C) 98.4 F (36.9 C)  TempSrc:   Oral Oral  Resp: 17 16 16 17   SpO2: 94% 96% 95% 95%    General: Drowsy postoperatively Eyes: PERRL, Conjunctiva normal Cardiovascular: S1 and S2 Present, no Murmur, Respiratory: Bilateral Air entry equal and Decreased, Clear to Auscultation, no Crackles, no wheezes Abdomen: Bowel Sound present, Soft and mild tenderness Skin: no redness, no Rash  Extremities: no Pedal edema, no calf tenderness  Data Reviewed: CBC:  Recent Labs Lab 12/06/15 1838 12/07/15 0205 12/08/15 0406  WBC 4.0 4.3 4.1  NEUTROABS 2.5  --  2.0  HGB 6.8* 8.3* 8.8*  HCT 22.5* 26.0* 27.4*  MCV 75.3* 75.4* 76.8*  PLT 353 311 0000000   Basic Metabolic Panel:  Recent Labs Lab 12/06/15 1838 12/07/15 0205 12/08/15 0406  NA 134* 133* 137  K 4.4 4.0 3.1*  CL 99* 98* 108  CO2 25 24 23   GLUCOSE 137* 141* 79  BUN 43* 44* 29*  CREATININE 1.73* 1.60* 0.95  CALCIUM 8.8* 8.6* 8.5*  MG  --   --  2.5*  PHOS  --   --  3.6    Liver Function Tests:  Recent Labs Lab 12/06/15 1838 12/07/15 0205 12/08/15 0406  AST 88* 62* 29  ALT 113* 98* 62*  ALKPHOS 793* 754* 751*  BILITOT 1.1 1.7* 0.4  PROT 5.6* 5.7* 5.5*  ALBUMIN 2.2* 2.3* 2.1*    Recent Labs Lab 12/07/15 1818  LIPASE 12    Recent Labs Lab  12/06/15 1838  AMMONIA 21   Coagulation Profile:  Recent Labs Lab 12/06/15 1838 12/08/15 0406  INR 1.50* 1.34   Cardiac Enzymes:  Recent Labs Lab 12/07/15 1818  CKTOTAL 40   BNP (last 3 results) No results for input(s): PROBNP in the last 8760 hours.  CBG:  Recent Labs Lab 12/07/15 2254 12/07/15 2321 12/08/15 0757 12/08/15 1133 12/08/15 1238  GLUCAP 62* 174* 97 135* 161*    Studies: Dg Cholangiogram Operative  12/08/2015  CLINICAL DATA:  Gallstone disease EXAM: INTRAOPERATIVE CHOLANGIOGRAM TECHNIQUE: Cholangiographic images from the C-arm fluoroscopic device were submitted for interpretation post-operatively. Please see the procedural report for the amount of contrast and the fluoroscopy time utilized. COMPARISON:  None. FINDINGS: Contrast fills the biliary tree and duodenum without filling defects in the common bile duct. IMPRESSION: Patent biliary tree. Electronically Signed   By: Marybelle Killings M.D.   On: 12/08/2015 10:48   Nm Pulmonary Perf And Vent  12/07/2015  CLINICAL DATA:  Elevated D-dimer, discharge from hospital yesterday following RIGHT BKA 11/29/2015 EXAM: NUCLEAR MEDICINE VENTILATION - PERFUSION LUNG SCAN TECHNIQUE: Ventilation images were obtained in multiple projections using inhaled  aerosol Tc-53m DTPA. Perfusion images were obtained in multiple projections after intravenous injection of Tc-67m MAA. RADIOPHARMACEUTICALS:  32.2 mCi Technetium-73m DTPA aerosol inhalation and 4.2 mCi Technetium-45m MAA IV COMPARISON:  10/14/2015 Radiographic correlation:  Chest radiograph 12/06/2015 FINDINGS: Ventilation: Enlargement of cardiac silhouette. Diminished ventilation RIGHT lower lobe. Ventilation otherwise normal. Perfusion: Enlargement of cardiac silhouette. Perfusion otherwise normal. Chest radiograph: Enlargement of cardiac silhouette with bibasilar atelectasis. IMPRESSION: Enlargement of cardiac silhouette. Otherwise normal perfusion lung scan. Diminished ventilation  RIGHT lower lobe. Electronically Signed   By: Lavonia Dana M.D.   On: 12/07/2015 17:33     Scheduled Meds: . amLODipine  10 mg Oral QHS  . aspirin  81 mg Oral Daily  . cefTRIAXone (ROCEPHIN)  IV  1 g Intravenous Q24H  . cilostazol  100 mg Oral BID  . cloNIDine  0.2 mg Transdermal Q Fri  . feeding supplement (ENSURE ENLIVE)  237 mL Oral BID BM  . insulin aspart  0-15 Units Subcutaneous TID WC  . insulin aspart  0-5 Units Subcutaneous QHS  . insulin detemir  10 Units Subcutaneous BID  . labetalol  300 mg Oral BID  . mycophenolate  360 mg Oral QPM  . mycophenolate  540 mg Oral q morning - 10a  . pantoprazole  40 mg Oral QHS  . tacrolimus  1.5 mg Oral BID   Continuous Infusions: . sodium chloride 10 mL/hr at 12/08/15 0823   PRN Meds: acetaminophen, HYDROmorphone (DILAUDID) injection, methocarbamol, oxyCODONE-acetaminophen, technetium TC 3M diethylenetriame-pentaacetic acid  Time spent: 30 minutes  Author: Grayland Ormond PA-S   If 7PM-7AM, please contact night-coverage at [email or web address], password [facility]

## 2015-12-08 NOTE — Anesthesia Postprocedure Evaluation (Signed)
Anesthesia Post Note  Patient: Bessy Snoddy Reily  Procedure(s) Performed: Procedure(s) (LRB): LAPAROSCOPIC CHOLECYSTECTOMY WITH INTRAOPERATIVE CHOLANGIOGRAM (N/A)  Patient location during evaluation: PACU Anesthesia Type: General Level of consciousness: awake and alert Pain management: pain level controlled Vital Signs Assessment: post-procedure vital signs reviewed and stable Respiratory status: spontaneous breathing, nonlabored ventilation, respiratory function stable and patient connected to nasal cannula oxygen Cardiovascular status: blood pressure returned to baseline and stable Postop Assessment: no signs of nausea or vomiting Anesthetic complications: no    Last Vitals:  Filed Vitals:   12/08/15 1148 12/08/15 1201  BP: 126/60 126/61  Pulse: 81 81  Temp:  36.8 C  Resp: 17 16    Last Pain:  Filed Vitals:   12/08/15 1203  PainSc: 0-No pain                 Joyanna Kleman,W. EDMOND

## 2015-12-08 NOTE — H&P (View-Only) (Signed)
Reason for Consult:Acute calculus cholecystitis  Referring Physician: Myrene Buddy  [name] is an 45 y.o. female.  HPI: [name], with a PMH significant for renal transplant 2/2 HTN/DM renal failure and recent (6/14) right BKA among myriad other problems, returned to the hospital yesterday with some confusion. She was just discharged on Tuesday. She has been having abd pain since Tuesday before discharge which was apparently thought to be constipation and she was given a stool softener. The pain waxed and waned over the next couple of days but was not associated with any nausea or vomiting or oral intake. There was no radiation. The only thing that made it better was a muscle relaxer. On evaluation in the ED her LFT's were noted to be abnormal and, with the abd pain, an [place] was ordered that showed acute calculus cholecystitis.   Past Medical History  Diagnosis Date  . Retinopathy   . Metabolic bone disease   . Anemia   . CHF (congestive heart failure) ([place])   . History of blood transfusion     related to "kidney transplant"  . Hypertension     sees Dr. Carolin Guernsey  . High cholesterol   . ESRD (end stage renal disease) on dialysis ([place]) 09/28/2011-01/23/2013    M-W-F; [place]  . Pneumonia 09/27/2011  . Type II diabetes mellitus ([place])     "controlled with diet"  . Critical ischemia of lower extremity hospitalized 10/05/2015    right  . Peripheral vascular disease ([place])   . Diabetic neuropathy ([place])   . GERD (gastroesophageal reflux disease)     Past Surgical History  Procedure Laterality Date  . Insertion of dialysis catheter  10/04/2011    Procedure: INSERTION OF DIALYSIS CATHETER;  Surgeon: Angelia Mould, MD;  Location: [place];  Service: Vascular;  Laterality: Right;  insertion of dialysis catheter right internal jugular  . Av fistula placement  10/04/2011    Procedure: INSERTION OF ARTERIOVENOUS (AV) GORE-TEX GRAFT ARM;  Surgeon: Angelia Mould, MD;   Location: [place];  Service: Vascular;  Laterality: Left;  Insertion left upper arm Arteriovenous goretex graft  . Avgg removal  10/04/2011    Procedure: REMOVAL OF ARTERIOVENOUS GORETEX GRAFT ([place]);  Surgeon: Elam Dutch, MD;  Location: [place];  Service: Vascular;  Laterality: Left;  . Av fistula placement  10/29/2011    Procedure: ARTERIOVENOUS (AV) FISTULA CREATION;  Surgeon: Angelia Mould, MD;  Location[facility];  Service: Vascular;  Laterality: Right;  Creation Right Arteriovenous Fistula   . Revison of arteriovenous fistula  06/23/2012    Procedure: REVISON OF ARTERIOVENOUS FISTULA;  Surgeon: Angelia Mould, MD;  Location: [place];  Service: Vascular;  Laterality: Right;  Ultrasound guided  . Insertion of dialysis catheter  06/23/2012    Procedure: INSERTION OF DIALYSIS CATHETER;  Surgeon: Angelia Mould, MD;  Location: [place];  Service: Vascular;  Laterality: N/A;  Ultrasound guided  . Patch angioplasty  06/23/2012    Procedure: PATCH ANGIOPLASTY;  Surgeon: Angelia Mould, MD;  Location: [facility] OR;  Service: Vascular;  Laterality: Right;  . Esophagogastroduodenoscopy N/A 12/24/2012    Procedure: ESOPHAGOGASTRODUODENOSCOPY (EGD);  Surgeon: Milus Banister, MD;  Location: [place];  Service: Endoscopy;  Laterality: N/A;  . Kidney transplant  01/24/2013  . Finger amputation Right 02/26/2013    "3rd finger"  . Shuntogram N/A 04/06/2012    Procedure: Earney Mallet;  Surgeon: Angelia Mould, MD;  Location: [facility] CATH LAB;  Service: Cardiovascular;  Laterality: N/A;  . Peripheral vascular catheterization N/A 09/19/2015    Procedure: Lower Extremity Angiography;  Surgeon: Adrian Prows, MD;  Location: [place] CV LAB;  Service: Cardiovascular;  Laterality: N/A;  . Peripheral vascular catheterization N/A 09/19/2015    Procedure: Abdominal Aortogram;  Surgeon: Adrian Prows, MD;  Location: [place] CV LAB;  Service: Cardiovascular;  Laterality: N/A;  . Peripheral vascular  catheterization Right 09/19/2015    Procedure: Peripheral Vascular Intervention;  Surgeon: Adrian Prows, MD;  Location: [place] CV LAB;  Service: Cardiovascular;  Laterality: Right;  Right Common  Iliac  . Peripheral vascular catheterization N/A 10/06/2015    Procedure: Lower Extremity Angiography;  Surgeon: Adrian Prows, MD;  Location: [place] CV LAB;  Service: Cardiovascular;  Laterality: N/A;  . Peripheral vascular catheterization  10/06/2015    Procedure: Peripheral Vascular Intervention;  Surgeon: Adrian Prows, MD;  Location: [place] CV LAB;  Service: Cardiovascular;;  REIA  Omnilink 6.0x29, innova 7x80  RSFA innova 5x80  . Amputation Right 10/13/2015    Procedure: Right Transmetatarsal Amputation;  Surgeon: Newt Minion, MD;  Location: [place];  Service: Orthopedics;  Laterality: Right;  . Amputation Right 11/29/2015    Procedure: RIGHT BELOW KNEE AMPUTATION;  Surgeon: Newt Minion, MD;  Location: [place];  Service: Orthopedics;  Laterality: Right;    Family History  Problem Relation Age of Onset  . Malignant hyperthermia Mother   . Malignant hyperthermia Father   . Anesthesia problems Neg Hx   . Thyroid disease Mother   . Diabetes Brother   . Heart disease Brother     CHF  . Hypertension Mother   . Hypertension Father     Social History:  reports that she has never smoked. She has never used smokeless tobacco. She reports that she does not drink alcohol or use illicit drugs.  Allergies: No Known Allergies  Medications: I have reviewed the patient's current medications.  Results for orders placed or performed during the hospital encounter of 12/06/15 (from the past 48 hour(s))  Sodium, urine, random     Status: None   Collection Time: 12/06/15  8:59 AM  Result Value Ref Range   Sodium, Ur <10 mmol/L    Comment: RESULT REPEATED AND VERIFIED  Creatinine, urine, random     Status: None   Collection Time: 12/06/15  8:59 AM  Result Value Ref Range   Creatinine, Urine 215.25 mg/dL   Protein / creatinine ratio, urine     Status: Abnormal   Collection Time: 12/06/15  9:00 AM  Result Value Ref Range   Creatinine, Urine 216.60 mg/dL   Total Protein, Urine 70 mg/dL    Comment: NO NORMAL RANGE ESTABLISHED FOR THIS TEST   Protein Creatinine Ratio 0.32 (H) 0.00 - 0.15 mg/mg[Cre]  APTT     Status: None   Collection Time: 12/06/15  6:38 PM  Result Value Ref Range   aPTT 35 24 - 37 seconds  Comprehensive metabolic panel     Status: Abnormal   Collection Time: 12/06/15  6:38 PM  Result Value Ref Range   Sodium 134 (L) 135 - 145 mmol/L   Potassium 4.4 3.5 - 5.1 mmol/L   Chloride 99 (L) 101 - 111 mmol/L   CO2 25 22 - 32 mmol/L   Glucose, Bld 137 (H) 65 - 99 mg/dL   BUN 43 (H) 6 - 20 mg/dL   Creatinine, Ser 1.73 (H) 0.44 - 1.00 mg/dL   Calcium 8.8 (L) 8.9 - 10.3  mg/dL   Total Protein 5.6 (L) 6.5 - 8.1 g/dL   Albumin 2.2 (L) 3.5 - 5.0 g/dL   AST 88 (H) 15 - 41 U/L   ALT 113 (H) 14 - 54 U/L   Alkaline Phosphatase 793 (H) 38 - 126 U/L   Total Bilirubin 1.1 0.3 - 1.2 mg/dL   GFR calc non Af Amer 34 (L) >60 mL/min   GFR calc Af Amer 40 (L) >60 mL/min    Comment: (NOTE) The eGFR has been calculated using the CKD EPI equation. This calculation has not been validated in all clinical situations. eGFR's persistently <60 mL/min signify possible Chronic Kidney Disease.    Anion gap 10 5 - 15  CBC WITH DIFFERENTIAL     Status: Abnormal   Collection Time: 12/06/15  6:38 PM  Result Value Ref Range   WBC 4.0 4.0 - 10.5 K/uL   RBC 2.99 (L) 3.87 - 5.11 MIL/uL   Hemoglobin 6.8 (LL) 12.0 - 15.0 g/dL    Comment: REPEATED TO VERIFY CRITICAL RESULT CALLED TO, READ BACK BY AND VERIFIED WITH: T GOSS,RN 1900 12/06/15 D BRAD[place]    HCT 22.5 (L) 36.0 - 46.0 %   MCV 75.3 (L) 78.0 - 100.0 fL   MCH 22.7 (L) 26.0 - 34.0 pg   MCHC 30.2 30.0 - 36.0 g/dL   RDW 17.9 (H) 11.5 - 15.5 %   Platelets 353 150 - 400 K/uL   Neutrophils Relative % 63 %   Lymphocytes Relative 18 %   Monocytes  Relative 17 %   Eosinophils Relative 2 %   Basophils Relative 0 %   Neutro Abs 2.5 1.7 - 7.7 K/uL   Lymphs Abs 0.7 0.7 - 4.0 K/uL   Monocytes Absolute 0.7 0.1 - 1.0 K/uL   Eosinophils Absolute 0.1 0.0 - 0.7 K/uL   Basophils Absolute 0.0 0.0 - 0.1 K/uL   WBC Morphology MILD LEFT SHIFT (1-5% METAS, OCC MYELO, OCC BANDS)     Comment: DOHLE BODIES   Smear Review LARGE PLATELETS PRESENT   Protime-INR     Status: Abnormal   Collection Time: 12/06/15  6:38 PM  Result Value Ref Range   Prothrombin Time 18.2 (H) 11.6 - 15.2 seconds   INR 1.50 (H) 0.00 - 1.49  Type and screen     Status: None (Preliminary result)   Collection Time: 12/06/15  6:38 PM  Result Value Ref Range   ABO/RH(D) A POS    Antibody Screen NEG    Sample Expiration 12/09/2015    Unit Number [id number]    Blood Component Type RED CELLS,LR    Unit division 00    Status of Unit ISSUED,FINAL    Transfusion Status OK TO TRANSFUSE    Crossmatch Result Compatible    Unit Number [id number]    Blood Component Type RED CELLS,LR    Unit division 00    Status of Unit ALLOCATED    Transfusion Status OK TO TRANSFUSE    Crossmatch Result Compatible   Ammonia     Status: None   Collection Time: 12/06/15  6:38 PM  Result Value Ref Range   Ammonia 21 9 - 35 umol/L  Brain natriuretic peptide     Status: Abnormal   Collection Time: 12/06/15  6:38 PM  Result Value Ref Range   B Natriuretic Peptide 221.9 (H) 0.0 - 100.0 pg/mL  I-stat troponin, ED     Status: None   Collection Time: 12/06/15  6:59 PM  Result Value Ref  Range   Troponin i, poc 0.00 0.00 - 0.08 ng/mL   Comment 3            Comment: Due to the release kinetics of cTnI, a negative result within the first hours of the onset of symptoms does not rule out myocardial infarction with certainty. If myocardial infarction is still suspected, repeat the test at appropriate intervals.   Urinalysis, Routine w reflex microscopic (not at [facility])     Status: Abnormal    Collection Time: 12/06/15  7:08 PM  Result Value Ref Range   Color, Urine RED (A) YELLOW    Comment: BIOCHEMICALS MAY BE AFFECTED BY COLOR   APPearance CLOUDY (A) CLEAR   Specific Gravity, Urine 1.022 1.005 - 1.030   pH 5.0 5.0 - 8.0   Glucose, UA NEGATIVE NEGATIVE mg/dL   Hgb urine dipstick LARGE (A) NEGATIVE   Bilirubin Urine LARGE (A) NEGATIVE   Ketones, ur 15 (A) NEGATIVE mg/dL   Protein, ur NEGATIVE NEGATIVE mg/dL   Nitrite NEGATIVE NEGATIVE   Leukocytes, UA SMALL (A) NEGATIVE  Urine microscopic-add on     Status: Abnormal   Collection Time: 12/06/15  7:08 PM  Result Value Ref Range   Squamous Epithelial / LPF 0-5 (A) NONE SEEN   WBC, UA 6-30 0 - 5 WBC/hpf   RBC / HPF 6-30 0 - 5 RBC/hpf   Bacteria, UA MANY (A) NONE SEEN   Casts HYALINE CASTS (A) NEGATIVE  Prepare RBC     Status: None   Collection Time: 12/06/15  7:39 PM  Result Value Ref Range   Order Confirmation ORDER PROCESSED BY BLOOD BANK   I-Stat Arterial Blood Gas, ED - (order at [facility] and [facility] only)     Status: Abnormal   Collection Time: 12/06/15  9:04 PM  Result Value Ref Range   pH, Arterial 7.382 7.350 - 7.450   pCO2 arterial 41.8 35.0 - 45.0 mmHg   pO2, Arterial 53.0 (L) 80.0 - 100.0 mmHg   Bicarbonate 24.8 (H) 20.0 - 24.0 mEq/L   TCO2 26 0 - 100 mmol/L   O2 Saturation 86.0 %   Patient temperature 98.6 F    Collection site RADIAL, ALLEN'S TEST ACCEPTABLE    Drawn by RT    Sample type ARTERIAL   Glucose, capillary     Status: Abnormal   Collection Time: 12/06/15 10:11 PM  Result Value Ref Range   Glucose-Capillary 125 (H) 65 - 99 mg/dL  Comprehensive metabolic panel     Status: Abnormal   Collection Time: 12/07/15  2:05 AM  Result Value Ref Range   Sodium 133 (L) 135 - 145 mmol/L   Potassium 4.0 3.5 - 5.1 mmol/L   Chloride 98 (L) 101 - 111 mmol/L   CO2 24 22 - 32 mmol/L   Glucose, Bld 141 (H) 65 - 99 mg/dL   BUN 44 (H) 6 - 20 mg/dL   Creatinine, Ser 1.60 (H) 0.44 - 1.00 mg/dL   Calcium 8.6 (L) 8.9 -  10.3 mg/dL   Total Protein 5.7 (L) 6.5 - 8.1 g/dL   Albumin 2.3 (L) 3.5 - 5.0 g/dL   AST 62 (H) 15 - 41 U/L   ALT 98 (H) 14 - 54 U/L   Alkaline Phosphatase 754 (H) 38 - 126 U/L   Total Bilirubin 1.7 (H) 0.3 - 1.2 mg/dL   GFR calc non Af Amer 38 (L) >60 mL/min   GFR calc Af Amer 44 (L) >60 mL/min  Comment: (NOTE) The eGFR has been calculated using the CKD EPI equation. This calculation has not been validated in all clinical situations. eGFR's persistently <60 mL/min signify possible Chronic Kidney Disease.    Anion gap 11 5 - 15  CBC     Status: Abnormal   Collection Time: 12/07/15  2:05 AM  Result Value Ref Range   WBC 4.3 4.0 - 10.5 K/uL   RBC 3.45 (L) 3.87 - 5.11 MIL/uL   Hemoglobin 8.3 (L) 12.0 - 15.0 g/dL   HCT 26.0 (L) 36.0 - 46.0 %   MCV 75.4 (L) 78.0 - 100.0 fL   MCH 24.1 (L) 26.0 - 34.0 pg   MCHC 31.9 30.0 - 36.0 g/dL   RDW 17.8 (H) 11.5 - 15.5 %   Platelets 311 150 - 400 K/uL  D-dimer, quantitative (not at [facility])     Status: Abnormal   Collection Time: 12/07/15  2:05 AM  Result Value Ref Range   D-Dimer, Quant 3.78 (H) 0.00 - 0.50 ug/mL-FEU    Comment: (NOTE) At the manufacturer cut-off of 0.50 ug/mL FEU, this assay has been documented to exclude PE with a sensitivity and negative predictive value of 97 to 99%.  At this time, this assay has not been approved by the FDA to exclude DVT/VTE. Results should be correlated with clinical presentation.   Glucose, capillary     Status: Abnormal   Collection Time: 12/07/15 12:04 PM  Result Value Ref Range   Glucose-Capillary 119 (H) 65 - 99 mg/dL    Dg Chest 2 View  12/06/2015  CLINICAL DATA:  Fatigue, weakness, poor appetite. Decreased O2 sats. EXAM: CHEST  2 VIEW COMPARISON:  10/14/2015 FINDINGS: Cardiomegaly. No overt edema. Lingular and right basilar atelectasis. No effusions. No acute bony abnormality. IMPRESSION: Bibasilar atelectasis.  Cardiomegaly. Electronically Signed   By: Rolm Baptise M.D.   On: 12/06/2015  19:40   Ct Head Wo Contrast  12/06/2015  CLINICAL DATA:  Per [facility], pt from home. Pt had R leg BKA on the 14th, released yesterday. Everything has been good, when she got home, c/o generalized weakness, altered, abnormal behavior, incontinence per family. o2 sats room air 87% per fire, pt placed on Finger, Sats 97%. VSS. HR 106, BP 130/60. Pt is AAOX4. Denies pain medicine. Pt weak, poor appetite. Pt taken prescribed pain medicine. EXAM: CT HEAD WITHOUT CONTRAST TECHNIQUE: Contiguous axial images were obtained from the base of the skull through the vertex without intravenous contrast. COMPARISON:  03/29/2011 FINDINGS: The ventricles are normal in size and configuration. There are no parenchymal masses or mass effect, no evidence of an infarct, no extra-axial masses or abnormal fluid collections and no intracranial hemorrhage. There is a small amount of dependent fluid in the left sphenoid sinus. There is mild sinus mucosal thickening involving all sinuses. The mastoid air cells are mostly opacified bilaterally. No skull lesion. IMPRESSION: 1. No intracranial abnormality. 2. Sinus mucosal thickening, mild. Bilateral mastoid air cell opacification, likely effusions. Electronically Signed   By: Lajean Manes M.D.   On: 12/06/2015 19:21   US Abdomen Complete  12/06/2015  CLINICAL DATA:  Acute onset of elevated LFTs. Generalized abdominal pain. Initial encounter. EXAM: ABDOMEN ULTRASOUND COMPLETE COMPARISON:  CT of the abdomen and pelvis from 04/02/2015, and renal ultrasound performed 03/31/2011 FINDINGS: Gallbladder: The gallbladder is filled with small stones and sludge, with significant surrounding pericholecystic fluid and a positive ultrasonographic Murphy's sign. Gallbladder wall thickening is noted. Findings are concerning for acute cholecystitis. Common bile duct:  Diameter: 0.6 cm, within normal limits in caliber. Liver: No focal lesion identified. Within normal limits in parenchymal echogenicity. IVC: No  abnormality visualized. Pancreas: Visualized portion unremarkable. Spleen: Size and appearance within normal limits. Right Kidney: Length: 7.1 cm. Diffusely increased parenchymal echogenicity. Moderate right renal atrophy is noted. No mass or hydronephrosis visualized. Left Kidney: Length: 7.0 cm. Diffusely increased parenchymal echogenicity. Moderate left renal atrophy is noted. No mass or hydronephrosis visualized. Abdominal aorta: No aneurysm visualized. Other findings: The patient's right iliac fossa transplant kidney is unremarkable in appearance, measuring 12.0 cm in length, with a single small 1.1 cm cyst. IMPRESSION: 1. Stones and sludge filling the gallbladder, with significant surrounding pericholecystic fluid, gallbladder wall thickening and a positive ultrasonographic Murphy's sign. Findings are concerning for acute cholecystitis. 2. Right iliac fossa transplant kidney is unremarkable in appearance, with a small renal cyst. Moderate atrophy of the native kidneys. Electronically Signed   By: Garald Balding M.D.   On: 12/06/2015 23:32    Review of Systems  Gastrointestinal: Positive for abdominal pain. Negative for nausea, vomiting, diarrhea and constipation.  Neurological:       Disorientation  All other systems reviewed and are negative.  Blood pressure 147/63, pulse 95, temperature 98 F (36.7 C), temperature source Oral, resp. rate 18, last menstrual period 09/05/2015, SpO2 95 %. Physical Exam  Constitutional: She appears well-developed and well-nourished. No distress.  HENT:  Head: Normocephalic.  Eyes: Conjunctivae are normal. Right eye exhibits no discharge. Left eye exhibits no discharge. No scleral icterus.  Neck: Normal range of motion. Neck supple.  Cardiovascular: Normal rate, regular rhythm and normal heart sounds.  Exam reveals no gallop and no friction rub.   No murmur heard. Respiratory: Effort normal and breath sounds normal. No respiratory distress. She has no wheezes.  She has no rales.  GI: Normal appearance. Bowel sounds are decreased. There is tenderness in the right upper quadrant and epigastric area. There is guarding and positive Murphy's sign. There is no rebound.  Musculoskeletal: Normal range of motion.  Lymphadenopathy:    She has no cervical adenopathy.  Neurological: She is alert.  Skin: Skin is warm and dry. She is not diaphoretic.  Psychiatric: She has a normal mood and affect. Her behavior is normal.    Assessment/Plan: Acute calculus cholecystitis -- Plan for lap chole in am. NPO after midnight. I spoke with the patient's mother and answered questions at the patient's request. Multiple medical problems -- Home meds    Lisette Abu, PA-C Pager: [phone] 12/07/2015, 12:58 PM

## 2015-12-08 NOTE — Op Note (Signed)
Laparoscopic Cholecystectomy with IOC Procedure Note  Indications: This patient presents with acute calculous cholecystitis and will undergo laparoscopic cholecystectomy.  Pre-operative Diagnosis: acute calculous cholecystitis  Post-operative Diagnosis: Same  Surgeon: Stark Klein   Assistants: Judyann Munson, RNFA  Anesthesia: General endotracheal anesthesia and local  ASA Class: 3  Procedure Details  The patient was seen again in the Holding Room. The risks, benefits, complications, treatment options, and expected outcomes were discussed with the patient. The possibilities of  bleeding, recurrent infection, damage to nearby structures, the need for additional procedures, failure to diagnose a condition, the possible need to convert to an open procedure, and creating a complication requiring transfusion or operation were discussed with the patient. The likelihood of improving the patient's symptoms with return to their baseline status is good.    The patient and/or family concurred with the proposed plan, giving informed consent. The site of surgery properly noted. The patient was taken to Operating Room, and the procedure verified as Laparoscopic Cholecystectomy with Intraoperative Cholangiogram. A Time Out was held and the above information confirmed.  Prior to the induction of general anesthesia, antibiotic prophylaxis was administered. General endotracheal anesthesia was then administered and tolerated well. After the induction, the abdomen was prepped with Chloraprep and draped in the sterile fashion. The patient was positioned in the supine position.  Local anesthetic agent was injected into the skin near the umbilicus and an incision made. We dissected down to the abdominal fascia with blunt dissection.  The fascia was incised vertically and we entered the peritoneal cavity bluntly.  A pursestring suture of 0-Vicryl was placed around the fascial opening.  The Hasson cannula was  inserted and secured with the stay suture.  Pneumoperitoneum was then created with CO2 and tolerated well without any adverse changes in the patient's vital signs. An 11-mm port was placed in the subxiphoid position.  Two 5-mm ports were placed in the right upper quadrant. All skin incisions were infiltrated with a local anesthetic agent before making the incision and placing the trocars. The gallbladder was obscured by omentum which was bluntly pulled down.   We positioned the patient in reverse Trendelenburg, tilted slightly to the patient's left.  The gallbladder was identified, the fundus grasped and retracted cephalad. Adhesions were lysed bluntly and with the electrocautery where indicated, taking care not to injure any adjacent organs or viscus. The infundibulum was grasped and retracted laterally, exposing the peritoneum overlying the triangle of Calot. This was then divided and exposed in a blunt fashion. A critical view of the cystic duct and cystic artery was obtained.  The cystic duct was clearly identified and bluntly dissected circumferentially. The cystic duct was ligated with a clip distally.   An incision was made in the cystic duct and the [facility] cholangiogram catheter introduced. The catheter was secured using a clip. A cholangiogram was then performed, demonstrating no filling defects and good delineation of the biliary tree.  The cystic duct was then ligated with clips and divided. The cystic artery was identified, dissected free, ligated with clips and divided as well.   The gallbladder was dissected from the liver bed in retrograde fashion with the electrocautery. The gallbladder was removed and placed in an Endocatch bag.  The gallbladder and Endocatch bag were then removed through the umbilical port site.  The liver bed was irrigated and inspected. Hemostasis was achieved with the electrocautery. Copious irrigation was utilized and was repeatedly aspirated until clear.    We again  inspected the right upper  quadrant for hemostasis.  SNOW hemostatic agent was placed.  Pneumoperitoneum was released as we removed the trocars.   The pursestring suture was used to close the umbilical fascia.  4-0 Monocryl was used to close the skin.   The skin was cleaned and dry, and Dermabond was applied. The patient was then extubated and brought to the recovery room in stable condition. Instrument, sponge, and needle counts were correct at closure and at the conclusion of the case.   Findings: Intrahepatic gangrenous gallbladder.  Sludge and sand like stones.  Stones appeared to be pigment stones as opposed to cholesterol stones.    Estimated Blood Loss: 25 mL         Drains: none          Specimens: Gallbladder to pathology       Complications: None; patient tolerated the procedure well.         Disposition: PACU - hemodynamically stable.         Condition: stable

## 2015-12-08 NOTE — Anesthesia Procedure Notes (Signed)
Procedure Name: Intubation Date/Time: 12/08/2015 9:45 AM Performed by: Maude Leriche D Pre-anesthesia Checklist: Patient identified, Emergency Drugs available, Suction available, Patient being monitored and Timeout performed Patient Re-evaluated:Patient Re-evaluated prior to inductionOxygen Delivery Method: Circle system utilized Preoxygenation: Pre-oxygenation with 100% oxygen Intubation Type: IV induction Ventilation: Mask ventilation without difficulty Laryngoscope Size: Miller and 2 Grade View: Grade I Tube type: Oral Tube size: 7.5 mm Number of attempts: 1 Airway Equipment and Method: Stylet Placement Confirmation: ETT inserted through vocal cords under direct vision,  positive ETCO2,  CO2 detector and breath sounds checked- equal and bilateral Secured at: 21 cm Tube secured with: Tape Dental Injury: Teeth and Oropharynx as per pre-operative assessment

## 2015-12-08 NOTE — Progress Notes (Signed)
Echocardiogram 2D Echocardiogram has been performed.  [name] 12/08/2015, 7:44 AM

## 2015-12-08 NOTE — Progress Notes (Signed)
Assessment/Plan: 1 AKI, resolved 2 ESRD: Renal TX 3 PVD post BKA 4 s/p cholecystecomy 5 hypokalemia P D/C IVF, K replace  Subjective: Interval History: post op  Objective: Vital signs in last 24 hours: Temp:  [97.9 F (36.6 C)-98.5 F (36.9 C)] 98.4 F (36.9 C) (06/23 1241) Pulse Rate:  [80-97] 87 (06/23 1241) Resp:  [16-18] 17 (06/23 1241) BP: (126-168)/(58-67) 144/62 mmHg (06/23 1241) SpO2:  [92 %-100 %] 95 % (06/23 1241) Weight change:   Intake/Output from previous day: 06/22 0701 - 06/23 0700 In: 976.3 [P.O.:320; I.V.:656.3] Out: 625 [Urine:625] Intake/Output this shift: Total I/O In: 400 [I.V.:400] Out: 50 [Blood:50]  General appearance: alert, slowed mentation and post op lethargy Resp: clear to auscultation bilaterally Cardio: regular rate and rhythm, S1, S2 normal, no murmur, click, rub or gallop Extremities: R BKA  Lab Results:  Recent Labs  12/07/15 0205 12/08/15 0406  WBC 4.3 4.1  HGB 8.3* 8.8*  HCT 26.0* 27.4*  PLT 311 353   BMET:  Recent Labs  12/07/15 0205 12/08/15 0406  NA 133* 137  K 4.0 3.1*  CL 98* 108  CO2 24 23  GLUCOSE 141* 79  BUN 44* 29*  CREATININE 1.60* 0.95  CALCIUM 8.6* 8.5*   No results for input(s): PTH in the last 72 hours. Iron Studies:  Recent Labs  12/07/15 1818  IRON 49  TIBC 148*   Studies/Results: Dg Chest 2 View  12/06/2015  CLINICAL DATA:  Fatigue, weakness, poor appetite. Decreased O2 sats. EXAM: CHEST  2 VIEW COMPARISON:  10/14/2015 FINDINGS: Cardiomegaly. No overt edema. Lingular and right basilar atelectasis. No effusions. No acute bony abnormality. IMPRESSION: Bibasilar atelectasis.  Cardiomegaly. Electronically Signed   By: Rolm Baptise M.D.   On: 12/06/2015 19:40   Dg Cholangiogram Operative  12/08/2015  CLINICAL DATA:  Gallstone disease EXAM: INTRAOPERATIVE CHOLANGIOGRAM TECHNIQUE: Cholangiographic images from the C-arm fluoroscopic device were submitted for interpretation post-operatively. Please  see the procedural report for the amount of contrast and the fluoroscopy time utilized. COMPARISON:  None. FINDINGS: Contrast fills the biliary tree and duodenum without filling defects in the common bile duct. IMPRESSION: Patent biliary tree. Electronically Signed   By: Marybelle Killings M.D.   On: 12/08/2015 10:48   Ct Head Wo Contrast  12/06/2015  CLINICAL DATA:  Per [facility], pt from home. Pt had R leg BKA on the 14th, released yesterday. Everything has been good, when she got home, c/o generalized weakness, altered, abnormal behavior, incontinence per family. o2 sats room air 87% per fire, pt placed on [place], Sats 97%. VSS. HR 106, BP 130/60. Pt is AAOX4. Denies pain medicine. Pt weak, poor appetite. Pt taken prescribed pain medicine. EXAM: CT HEAD WITHOUT CONTRAST TECHNIQUE: Contiguous axial images were obtained from the base of the skull through the vertex without intravenous contrast. COMPARISON:  03/29/2011 FINDINGS: The ventricles are normal in size and configuration. There are no parenchymal masses or mass effect, no evidence of an infarct, no extra-axial masses or abnormal fluid collections and no intracranial hemorrhage. There is a small amount of dependent fluid in the left sphenoid sinus. There is mild sinus mucosal thickening involving all sinuses. The mastoid air cells are mostly opacified bilaterally. No skull lesion. IMPRESSION: 1. No intracranial abnormality. 2. Sinus mucosal thickening, mild. Bilateral mastoid air cell opacification, likely effusions. Electronically Signed   By: Lajean Manes M.D.   On: 12/06/2015 19:21   US Abdomen Complete  12/06/2015  CLINICAL DATA:  Acute onset of elevated LFTs. Generalized  abdominal pain. Initial encounter. EXAM: ABDOMEN ULTRASOUND COMPLETE COMPARISON:  CT of the abdomen and pelvis from 04/02/2015, and renal ultrasound performed 03/31/2011 FINDINGS: Gallbladder: The gallbladder is filled with small stones and sludge, with significant surrounding pericholecystic  fluid and a positive ultrasonographic Murphy's sign. Gallbladder wall thickening is noted. Findings are concerning for acute cholecystitis. Common bile duct: Diameter: 0.6 cm, within normal limits in caliber. Liver: No focal lesion identified. Within normal limits in parenchymal echogenicity. IVC: No abnormality visualized. Pancreas: Visualized portion unremarkable. Spleen: Size and appearance within normal limits. Right Kidney: Length: 7.1 cm. Diffusely increased parenchymal echogenicity. Moderate right renal atrophy is noted. No mass or hydronephrosis visualized. Left Kidney: Length: 7.0 cm. Diffusely increased parenchymal echogenicity. Moderate left renal atrophy is noted. No mass or hydronephrosis visualized. Abdominal aorta: No aneurysm visualized. Other findings: The patient's right iliac fossa transplant kidney is unremarkable in appearance, measuring 12.0 cm in length, with a single small 1.1 cm cyst. IMPRESSION: 1. Stones and sludge filling the gallbladder, with significant surrounding pericholecystic fluid, gallbladder wall thickening and a positive ultrasonographic Murphy's sign. Findings are concerning for acute cholecystitis. 2. Right iliac fossa transplant kidney is unremarkable in appearance, with a small renal cyst. Moderate atrophy of the native kidneys. Electronically Signed   By: Garald Balding M.D.   On: 12/06/2015 23:32   Nm Pulmonary Perf And Vent  12/07/2015  CLINICAL DATA:  Elevated D-dimer, discharge from hospital yesterday following RIGHT BKA 11/29/2015 EXAM: NUCLEAR MEDICINE VENTILATION - PERFUSION LUNG SCAN TECHNIQUE: Ventilation images were obtained in multiple projections using inhaled aerosol Tc-62m DTPA. Perfusion images were obtained in multiple projections after intravenous injection of Tc-13m MAA. RADIOPHARMACEUTICALS:  32.2 mCi Technetium-63m DTPA aerosol inhalation and 4.2 mCi Technetium-66m MAA IV COMPARISON:  10/14/2015 Radiographic correlation:  Chest radiograph 12/06/2015  FINDINGS: Ventilation: Enlargement of cardiac silhouette. Diminished ventilation RIGHT lower lobe. Ventilation otherwise normal. Perfusion: Enlargement of cardiac silhouette. Perfusion otherwise normal. Chest radiograph: Enlargement of cardiac silhouette with bibasilar atelectasis. IMPRESSION: Enlargement of cardiac silhouette. Otherwise normal perfusion lung scan. Diminished ventilation RIGHT lower lobe. Electronically Signed   By: Lavonia Dana M.D.   On: 12/07/2015 17:33    Scheduled: . amLODipine  10 mg Oral QHS  . aspirin  81 mg Oral Daily  . cefTRIAXone (ROCEPHIN)  IV  1 g Intravenous Q24H  . cilostazol  100 mg Oral BID  . cloNIDine  0.2 mg Transdermal Q Fri  . feeding supplement (ENSURE ENLIVE)  237 mL Oral BID BM  . insulin aspart  0-15 Units Subcutaneous TID WC  . insulin aspart  0-5 Units Subcutaneous QHS  . insulin detemir  10 Units Subcutaneous BID  . labetalol  300 mg Oral BID  . mycophenolate  360 mg Oral QPM  . mycophenolate  540 mg Oral q morning - 10a  . pantoprazole  40 mg Oral QHS  . tacrolimus  1.5 mg Oral BID      LOS: 2 days   Kartier Bennison C 12/08/2015,2:12 PM

## 2015-12-08 NOTE — Progress Notes (Signed)
Pt bp 172/63. On call NP notified. Order for 5mg  hydralazine placed. RN implemented order. Recheck BP: 178/59. On call NP notified. RN awaiting orders.   Ermalinda Memos, RN

## 2015-12-08 NOTE — Interval H&P Note (Signed)
History and Physical Interval Note:  12/08/2015 9:05 AM  [name]  has presented today for surgery, with the diagnosis of ACUTE CALCULUS CHOLECYSTITIS  The various methods of treatment have been discussed with the patient and family.   VQ scan negative yesterday.    After consideration of risks, benefits and other options for treatment, the patient has consented to  Procedure(s): LAPAROSCOPIC POSSIBLE OPEN CHOLECYSTECTOMY WITH INTRAOPERATIVE CHOLANGIOGRAM (N/A) as a surgical intervention .  The patient's history has been reviewed, patient examined, no change in status, stable for surgery.  I have reviewed the patient's chart and labs.  Questions were answered to the patient's satisfaction.     [name]

## 2015-12-09 DIAGNOSIS — N186 End stage renal disease: Secondary | ICD-10-CM | POA: Diagnosis not present

## 2015-12-09 DIAGNOSIS — Z992 Dependence on renal dialysis: Secondary | ICD-10-CM | POA: Diagnosis not present

## 2015-12-09 DIAGNOSIS — E43 Unspecified severe protein-calorie malnutrition: Secondary | ICD-10-CM | POA: Diagnosis not present

## 2015-12-09 DIAGNOSIS — N2581 Secondary hyperparathyroidism of renal origin: Secondary | ICD-10-CM | POA: Diagnosis not present

## 2015-12-09 DIAGNOSIS — N179 Acute kidney failure, unspecified: Secondary | ICD-10-CM | POA: Diagnosis not present

## 2015-12-09 DIAGNOSIS — D638 Anemia in other chronic diseases classified elsewhere: Secondary | ICD-10-CM | POA: Diagnosis not present

## 2015-12-09 DIAGNOSIS — N390 Urinary tract infection, site not specified: Secondary | ICD-10-CM | POA: Diagnosis not present

## 2015-12-09 DIAGNOSIS — J9601 Acute respiratory failure with hypoxia: Secondary | ICD-10-CM | POA: Diagnosis not present

## 2015-12-09 DIAGNOSIS — D62 Acute posthemorrhagic anemia: Secondary | ICD-10-CM | POA: Diagnosis not present

## 2015-12-09 DIAGNOSIS — N189 Chronic kidney disease, unspecified: Secondary | ICD-10-CM | POA: Diagnosis not present

## 2015-12-09 DIAGNOSIS — I120 Hypertensive chronic kidney disease with stage 5 chronic kidney disease or end stage renal disease: Secondary | ICD-10-CM | POA: Diagnosis not present

## 2015-12-09 LAB — GLUCOSE, CAPILLARY
Glucose-Capillary: 68 mg/dL (ref 65–99)
Glucose-Capillary: 78 mg/dL (ref 65–99)
Glucose-Capillary: 283 mg/dL — ABNORMAL HIGH (ref 65–99)
Glucose-Capillary: 52 mg/dL — ABNORMAL LOW (ref 65–99)
Glucose-Capillary: 72 mg/dL (ref 65–99)
Glucose-Capillary: 98 mg/dL (ref 65–99)

## 2015-12-09 LAB — CBC WITH DIFFERENTIAL/PLATELET
Basophils Absolute: 0 10*3/uL (ref 0.0–0.1)
Basophils Relative: 0 %
Eosinophils Absolute: 0 10*3/uL (ref 0.0–0.7)
Eosinophils Relative: 1 %
HCT: 28.5 % — ABNORMAL LOW (ref 36.0–46.0)
Hemoglobin: 9 g/dL — ABNORMAL LOW (ref 12.0–15.0)
Lymphocytes Relative: 21 %
Lymphs Abs: 0.8 10*3/uL (ref 0.7–4.0)
MCH: 24.3 pg — ABNORMAL LOW (ref 26.0–34.0)
MCHC: 31.6 g/dL (ref 30.0–36.0)
MCV: 77 fL — ABNORMAL LOW (ref 78.0–100.0)
Monocytes Absolute: 0.9 10*3/uL (ref 0.1–1.0)
Monocytes Relative: 22 %
Neutro Abs: 2.2 10*3/uL (ref 1.7–7.7)
Neutrophils Relative %: 56 %
Platelets: 369 10*3/uL (ref 150–400)
RBC: 3.7 MIL/uL — ABNORMAL LOW (ref 3.87–5.11)
RDW: 18.6 % — ABNORMAL HIGH (ref 11.5–15.5)
WBC: 3.9 10*3/uL — ABNORMAL LOW (ref 4.0–10.5)

## 2015-12-09 LAB — COMPREHENSIVE METABOLIC PANEL
ALT: 39 U/L (ref 14–54)
AST: 56 U/L — ABNORMAL HIGH (ref 15–41)
Albumin: 1.8 g/dL — ABNORMAL LOW (ref 3.5–5.0)
Alkaline Phosphatase: 593 U/L — ABNORMAL HIGH (ref 38–126)
Anion gap: 8 (ref 5–15)
BUN: 18 mg/dL (ref 6–20)
CO2: 22 mmol/L (ref 22–32)
Calcium: 8.2 mg/dL — ABNORMAL LOW (ref 8.9–10.3)
Chloride: 110 mmol/L (ref 101–111)
Creatinine, Ser: 0.82 mg/dL (ref 0.44–1.00)
GFR calc Af Amer: >60 mL/min (ref >60)
GFR calc non Af Amer: >60 mL/min (ref >60)
Glucose, Bld: 72 mg/dL (ref 65–99)
Potassium: 3.5 mmol/L (ref 3.5–5.1)
Sodium: 140 mmol/L (ref 135–145)
Total Bilirubin: 0.5 mg/dL (ref 0.3–1.2)
Total Protein: 5.2 g/dL — ABNORMAL LOW (ref 6.5–8.1)

## 2015-12-09 LAB — MAGNESIUM: Magnesium: 2.2 mg/dL (ref 1.7–2.4)

## 2015-12-09 LAB — URINE CULTURE: Culture: 40000 — AB

## 2015-12-09 NOTE — Progress Notes (Signed)
Hypoglycemic Event  CBG: 68  Treatment: Refused protocol,Ate her breakfast  Symptoms: None  Follow-up CBG: [id number] CBG Result:78  Possible Reasons for Event: Poor p.o. intake  Comments/MD notified:Yes    Albia, Chattie Greeson Tolentino

## 2015-12-09 NOTE — Progress Notes (Signed)
Assessment/Plan: 1 AKI, resolved 2 ESRD: Renal TX doing well 3 PVD post BKA 4 s/p cholecystectomy, doing well 5 Hpokalemia, resolved P  To resume prograf PO.  We will sign off.   Subjective: Interval History: Feels better, tol liquids  Objective: Vital signs in last 24 hours: Temp:  [97.9 F (36.6 C)-99.8 F (37.7 C)] 98.3 F (36.8 C) (06/24 0819) Pulse Rate:  [80-106] 98 (06/24 0819) Resp:  [16-19] 18 (06/24 0819) BP: (126-178)/(54-63) 152/63 mmHg (06/24 0819) SpO2:  [94 %-100 %] 100 % (06/24 0819) Weight:  [58.605 kg (129 lb 3.2 oz)] 58.605 kg (129 lb 3.2 oz) (06/23 2014) Weight change:   Intake/Output from previous day: 06/23 0701 - 06/24 0700 In: 680 [P.O.:280; I.V.:400] Out: 350 [Urine:300; Blood:50] Intake/Output this shift: Total I/O In: 480 [P.O.:480] Out: -   General appearance: alert and cooperative Resp: clear to auscultation bilaterally Cardio: regular rate and rhythm, S1, S2 normal, no murmur, click, rub or gallop GI: soft, non-tender; bowel sounds normal; no masses,  no organomegaly and post op  Lab Results:  Recent Labs  12/08/15 0406 12/09/15 0459  WBC 4.1 3.9*  HGB 8.8* 9.0*  HCT 27.4* 28.5*  PLT 353 369   BMET:  Recent Labs  12/08/15 0406 12/09/15 0459  NA 137 140  K 3.1* 3.5  CL 108 110  CO2 23 22  GLUCOSE 79 72  BUN 29* 18  CREATININE 0.95 0.82  CALCIUM 8.5* 8.2*   No results for input(s): PTH in the last 72 hours. Iron Studies:  Recent Labs  12/07/15 1818  IRON 49  TIBC 148*   Studies/Results: Dg Cholangiogram Operative  12/08/2015  CLINICAL DATA:  Gallstone disease EXAM: INTRAOPERATIVE CHOLANGIOGRAM TECHNIQUE: Cholangiographic images from the C-arm fluoroscopic device were submitted for interpretation post-operatively. Please see the procedural report for the amount of contrast and the fluoroscopy time utilized. COMPARISON:  None. FINDINGS: Contrast fills the biliary tree and duodenum without filling defects in the  common bile duct. IMPRESSION: Patent biliary tree. Electronically Signed   By: Marybelle Killings M.D.   On: 12/08/2015 10:48   Nm Pulmonary Perf And Vent  12/07/2015  CLINICAL DATA:  Elevated D-dimer, discharge from hospital yesterday following RIGHT BKA 11/29/2015 EXAM: NUCLEAR MEDICINE VENTILATION - PERFUSION LUNG SCAN TECHNIQUE: Ventilation images were obtained in multiple projections using inhaled aerosol Tc-60m DTPA. Perfusion images were obtained in multiple projections after intravenous injection of Tc-61m MAA. RADIOPHARMACEUTICALS:  32.2 mCi Technetium-49m DTPA aerosol inhalation and 4.2 mCi Technetium-27m MAA IV COMPARISON:  10/14/2015 Radiographic correlation:  Chest radiograph 12/06/2015 FINDINGS: Ventilation: Enlargement of cardiac silhouette. Diminished ventilation RIGHT lower lobe. Ventilation otherwise normal. Perfusion: Enlargement of cardiac silhouette. Perfusion otherwise normal. Chest radiograph: Enlargement of cardiac silhouette with bibasilar atelectasis. IMPRESSION: Enlargement of cardiac silhouette. Otherwise normal perfusion lung scan. Diminished ventilation RIGHT lower lobe. Electronically Signed   By: Lavonia Dana M.D.   On: 12/07/2015 17:33    Scheduled: . amLODipine  10 mg Oral QHS  . aspirin  81 mg Oral Daily  . cefTRIAXone (ROCEPHIN)  IV  1 g Intravenous Q24H  . cilostazol  100 mg Oral BID  . cloNIDine  0.2 mg Transdermal Q Fri  . feeding supplement (ENSURE ENLIVE)  237 mL Oral BID BM  . insulin aspart  0-15 Units Subcutaneous TID WC  . insulin aspart  0-5 Units Subcutaneous QHS  . insulin detemir  10 Units Subcutaneous BID  . labetalol  300 mg Oral BID  . mycophenolate  360 mg  Oral QPM  . mycophenolate  540 mg Oral q morning - 10a  . pantoprazole  40 mg Oral QHS  . tacrolimus  1.5 mg Oral BID     LOS: 3 days   Wyatte Dames C 12/09/2015,10:41 AM

## 2015-12-09 NOTE — Progress Notes (Signed)
1 Day Post-Op  Subjective: Alert.  Very pleasant.  Tolerating clear liquids.  Has not been out of bed yet. Afebrile.  Heart rate 98. Lab work this morning shows hemoglobin 9.0.  Alkaline phosphatase 593.  Total bilirubin 0.5.  Albumin 1.8.  Potassium 3.5. Objective: Vital signs in last 24 hours: Temp:  [97.9 F (36.6 C)-99.8 F (37.7 C)] 98.3 F (36.8 C) (06/24 0819) Pulse Rate:  [80-106] 98 (06/24 0819) Resp:  [16-19] 18 (06/24 0819) BP: (126-178)/(54-63) 152/63 mmHg (06/24 0819) SpO2:  [94 %-100 %] 100 % (06/24 0819) Weight:  [58.605 kg (129 lb 3.2 oz)] 58.605 kg (129 lb 3.2 oz) (06/23 2014) Last BM Date: 12/07/15  Intake/Output from previous day: 06/23 0701 - 06/24 0700 In: 680 [P.O.:280; I.V.:400] Out: 350 [Urine:300; Blood:50] Intake/Output this shift: Total I/O In: 480 [P.O.:480] Out: -   General appearance: Alert.  No distress.  Pleasant and cooperative. Resp: clear to auscultation bilaterally GI: Soft.  Nondistended.  Wounds look good.  Minimally tender.  Benign postop exam.  Lab Results:  Results for orders placed or performed during the hospital encounter of 12/06/15 (from the past 24 hour(s))  Glucose, capillary     Status: Abnormal   Collection Time: 12/08/15 11:33 AM  Result Value Ref Range   Glucose-Capillary 135 (H) 65 - 99 mg/dL   Comment 1 Notify RN    Comment 2 Document in Chart   Glucose, capillary     Status: Abnormal   Collection Time: 12/08/15 12:38 PM  Result Value Ref Range   Glucose-Capillary 161 (H) 65 - 99 mg/dL  Glucose, capillary     Status: Abnormal   Collection Time: 12/08/15  4:40 PM  Result Value Ref Range   Glucose-Capillary 152 (H) 65 - 99 mg/dL  Glucose, capillary     Status: Abnormal   Collection Time: 12/08/15  8:12 PM  Result Value Ref Range   Glucose-Capillary 146 (H) 65 - 99 mg/dL  CBC with Differential/Platelet     Status: Abnormal   Collection Time: 12/09/15  4:59 AM  Result Value Ref Range   WBC 3.9 (L) 4.0 - 10.5 K/uL    RBC 3.70 (L) 3.87 - 5.11 MIL/uL   Hemoglobin 9.0 (L) 12.0 - 15.0 g/dL   HCT 28.5 (L) 36.0 - 46.0 %   MCV 77.0 (L) 78.0 - 100.0 fL   MCH 24.3 (L) 26.0 - 34.0 pg   MCHC 31.6 30.0 - 36.0 g/dL   RDW 18.6 (H) 11.5 - 15.5 %   Platelets 369 150 - 400 K/uL   Neutrophils Relative % 56 %   Lymphocytes Relative 21 %   Monocytes Relative 22 %   Eosinophils Relative 1 %   Basophils Relative 0 %   Neutro Abs 2.2 1.7 - 7.7 K/uL   Lymphs Abs 0.8 0.7 - 4.0 K/uL   Monocytes Absolute 0.9 0.1 - 1.0 K/uL   Eosinophils Absolute 0.0 0.0 - 0.7 K/uL   Basophils Absolute 0.0 0.0 - 0.1 K/uL   RBC Morphology POLYCHROMASIA PRESENT    WBC Morphology MILD LEFT SHIFT (1-5% METAS, OCC MYELO, OCC BANDS)   Comprehensive metabolic panel     Status: Abnormal   Collection Time: 12/09/15  4:59 AM  Result Value Ref Range   Sodium 140 135 - 145 mmol/L   Potassium 3.5 3.5 - 5.1 mmol/L   Chloride 110 101 - 111 mmol/L   CO2 22 22 - 32 mmol/L   Glucose, Bld 72 65 - 99 mg/dL  BUN 18 6 - 20 mg/dL   Creatinine, Ser 0.82 0.44 - 1.00 mg/dL   Calcium 8.2 (L) 8.9 - 10.3 mg/dL   Total Protein 5.2 (L) 6.5 - 8.1 g/dL   Albumin 1.8 (L) 3.5 - 5.0 g/dL   AST 56 (H) 15 - 41 U/L   ALT 39 14 - 54 U/L   Alkaline Phosphatase 593 (H) 38 - 126 U/L   Total Bilirubin 0.5 0.3 - 1.2 mg/dL   GFR calc non Af Amer >60 >60 mL/min   GFR calc Af Amer >60 >60 mL/min   Anion gap 8 5 - 15  Magnesium     Status: None   Collection Time: 12/09/15  4:59 AM  Result Value Ref Range   Magnesium 2.2 1.7 - 2.4 mg/dL  Glucose, capillary     Status: None   Collection Time: 12/09/15  8:15 AM  Result Value Ref Range   Glucose-Capillary 68 65 - 99 mg/dL  Glucose, capillary     Status: None   Collection Time: 12/09/15  8:55 AM  Result Value Ref Range   Glucose-Capillary 78 65 - 99 mg/dL     Studies/Results: Dg Cholangiogram Operative  12/08/2015  CLINICAL DATA:  Gallstone disease EXAM: INTRAOPERATIVE CHOLANGIOGRAM TECHNIQUE: Cholangiographic  images from the C-arm fluoroscopic device were submitted for interpretation post-operatively. Please see the procedural report for the amount of contrast and the fluoroscopy time utilized. COMPARISON:  None. FINDINGS: Contrast fills the biliary tree and duodenum without filling defects in the common bile duct. IMPRESSION: Patent biliary tree. Electronically Signed   By: Marybelle Killings M.D.   On: 12/08/2015 10:48    . amLODipine  10 mg Oral QHS  . aspirin  81 mg Oral Daily  . cefTRIAXone (ROCEPHIN)  IV  1 g Intravenous Q24H  . cilostazol  100 mg Oral BID  . cloNIDine  0.2 mg Transdermal Q Fri  . feeding supplement (ENSURE ENLIVE)  237 mL Oral BID BM  . insulin aspart  0-15 Units Subcutaneous TID WC  . insulin aspart  0-5 Units Subcutaneous QHS  . insulin detemir  10 Units Subcutaneous BID  . labetalol  300 mg Oral BID  . mycophenolate  360 mg Oral QPM  . mycophenolate  540 mg Oral q morning - 10a  . pantoprazole  40 mg Oral QHS  . tacrolimus  1.5 mg Oral BID     Assessment/Plan: s/p Procedure(s): LAPAROSCOPIC CHOLECYSTECTOMY WITH INTRAOPERATIVE CHOLANGIOGRAM  POD #1.  Laparoscopic cholecystectomy with cholangiogram. Cholangiogram was normal Advance diet as tolerated PT consult in anticipation of discharge tomorrow  @PROBHOSP @  LOS: 3 days    Bryn Saline M 12/09/2015  . .prob

## 2015-12-09 NOTE — Evaluation (Signed)
Physical Therapy Evaluation Patient Details Name: [name] MRN: [id number] DOB: September 30, 1970 Today's Date: 12/09/2015   History of Present Illness  pt is a 45 y/o female with pmh of DM, CHF, HTN ESRD, PVD, neuropathy, multiple amputations of foot to attempt limb salvage with ultimate R BKA on 6/14. pt was d/c'd to home, but immediately had mental status changes and weakness with inability to mobilize like the day before in hospital, so pt readmitted 6/21 and found to have cholecystitis, s/p lap chole on 6/23. Meanwhile pt has been inactive in the bed.  Clinical Impression  Pt admitted with/for AMS, weak and unable to mobilize, found to be due to cholecystitis s/p lap chole.   Pt is currently more weak than prior to last d/c and needs a few days of therapy before d/c home.   Pt currently limited functionally due to the problems listed below.  (see problems list.)  Pt will benefit from PT to maximize function and safety to be able to get home safely with available assist of family.     Follow Up Recommendations Home health PT;Supervision for mobility/OOB    Equipment Recommendations  None recommended by PT    Recommendations for Other Services       Precautions / Restrictions Precautions Precautions: Fall      Mobility  Bed Mobility Overal bed mobility: Needs Assistance Bed Mobility: Sidelying to Sit;Rolling;Sit to Supine Rolling: Min assist Sidelying to sit: Min assist   Sit to supine: Min guard   General bed mobility comments: cued to roll to side and up for decreasing pain  Transfers Overall transfer level: Needs assistance Equipment used: Rolling walker (2 wheeled) Transfers: Sit to/from Stand Sit to Stand: Min assist         General transfer comment: 2 standing trials with stability assist  Ambulation/Gait             General Gait Details: not test this eval, but pt able to lift her weight off foot in place, otherwise pt deferred  Stairs             Wheelchair Mobility    Modified Rankin (Stroke Patients Only)       Balance     Sitting balance-Leahy Scale: Good       Standing balance-Leahy Scale: Poor                               Pertinent Vitals/Pain Pain Assessment: 0-10 Pain Score: 2  Pain Location: lap chole incision Pain Descriptors / Indicators: Sore Pain Intervention(s): Monitored during session    Home Living Family/patient expects to be discharged to:: Private residence Living Arrangements: Parent Available Help at Discharge: Family;Available 24 hours/day Type of Home: House Home Access: Ramped entrance Entrance Stairs-Rails: Left   Home Layout: Two level;Able to live on main level with bedroom/bathroom Home Equipment: Walker - 4 wheels;Wheelchair - [employer];Tub bench Additional Comments: pt is more deconditioned than day before expected d/c last admission.  pt still plans to return home, but hopes not to leave Sunday. 6/25.    Prior Function Level of Independence: Independent with assistive device(s)   Gait / Transfers Assistance Needed: using rw in home and w/c for community distances  ADL's / Homemaking Assistance Needed: Independent with selfcare  Comments: Used RW at all times for pain.  Would go up stairs sideways while holding onto railing, leading w/ Rt LE.     Hand Dominance  Dominant Hand: Left    Extremity/Trunk Assessment               Lower Extremity Assessment: Generalized weakness (bilaterally weak--pt has not been proactive with movement)         Communication   Communication: No difficulties  Cognition Arousal/Alertness: Awake/alert Behavior During Therapy: WFL for tasks assessed/performed Overall Cognitive Status: Within Functional Limits for tasks assessed                      General Comments General comments (skin integrity, edema, etc.): reinforced exercise and positioning    Exercises Amputee Exercises Quad Sets:  AROM;Both;5 reps;Supine Hip Flexion/Marching: AROM;Strengthening;5 reps;Supine      Assessment/Plan    PT Assessment Patient needs continued PT services  PT Diagnosis     PT Problem List Decreased strength;Decreased activity tolerance;Decreased balance;Decreased mobility;Decreased knowledge of use of DME;Pain  PT Treatment Interventions DME instruction;Gait training;Functional mobility training;Therapeutic activities;Therapeutic exercise;Balance training;Patient/family education   PT Goals (Current goals can be found in the Care Plan section) Acute Rehab PT Goals Patient Stated Goal: to go home PT Goal Formulation: With patient Time For Goal Achievement: 12/16/15 Potential to Achieve Goals: Good    Frequency Min 3X/week   Barriers to discharge        Co-evaluation               End of Session   Activity Tolerance: Patient tolerated treatment well Patient left: in bed;with call bell/phone within reach Nurse Communication: Mobility status         Time: 1715-1745 PT Time Calculation (min) (ACUTE ONLY): 30 min   Charges:   PT Evaluation $PT Eval Moderate Complexity: 1 Procedure PT Treatments $Therapeutic Activity: 8-22 mins   PT G Codes:        John Williamsen, Tessie Fass 12/09/2015, 6:09 PM 12/09/2015  Donnella Sham, PT [phone]  (pager)

## 2015-12-09 NOTE — Progress Notes (Signed)
[facility] Progress Note  Patient: [name] [id number]   PCP: Benito Mccreedy, MD DOB: Feb 02, 1971   DOA: 12/06/2015   DOS: 12/09/2015   Date of Service: the patient was seen and examined on 12/09/2015  Subjective: Patient denies any acute pain. No nausea no vomiting. Tolerating clear liquids. No abdominal pain. No bowel movements postoperatively  Nutrition: Clear liquid diet tolerating it well  Brief hospital course: Pt. with PMH of ESRD s/p renal transplant in 2014, IDDM, PVD with BKA on 11/29/2015, and anemia secondary to CKD ; admitted on 12/06/2015, with complaint of weakness, confusion, upper abdominal pain, nausea, vomiting, and diarrhea. Was found to have a Hb of 6.8 and transfused 1 unit of PRBC. Elevated LFTs. U/S confirms cholecystitis. Urine shows UTI and started of ceftriaxone. Consulted [facility] transplant, [facility] will manage care. Cholecystectomy preformed 12/08/2015.  Currently further plan is to continue current management and monitor symptoms.  Assessment and Plan: Acute Cholecystitis: RUQ U/s shows gallbladder thickening with sludge. No dilatation of the bile ducts LFTs are elevated, Total bilirubin 1.7, PT 18.2, INR 1.5 Placed on IV ceftriaxone and IVFs, currently NPO. General surgery preformed cholecystectomy 12/08/2015. Advancing diet to soft diet. Ambulate and use incentive spirometry. Will monitor for symptoms.     Acute blood loss anemia on chronic anemia of CKD:  Most likely Secondary to recent BKA on 6/14  Transfused total of 1 unit of PRBC this hospitilization Continue to monitor with CBC Continue home iron supplementation FOBT awaiting   Acute kidney injury, resolved Baseline Cr 1.0  Kidney transplant in 2014, on Prograft, mycophenolate and Bactrim. Will hold medications for possible toxicity and in the setting on ongoing infection. Prograft level pending. Transplant surgery at [facility] recommended pt can continue to have her nephrology  care here. Consulted Nephrology, IV fluids now stopped.  Acute Hypoxia 93% spo2 on ED presentation. Placed on 2 L O2 nasal cannula. Not on O2 at baseline. 95% spo2 today on 2 L. BNP is elevated but currently has AKI, does not appear clinically volume overloaded. ECHO on 10/14/15 shows EF of 65-70% D-dimer is 3.78, VQ scan negative. Echocardiogram pending for cardiomegaly.  UTI: Pyuria and bacteriuria with recent hospitalization. immunosupressed  Urine culture insignificant quantities of gram negative rods. Continue Ceftriaxone 1 g daily finish seven-day antibiotic treatment course.  IDDM: Stop Levemir, continue sliding scale corrections. On Levemir 14 and 22 units at home. Continue to monitor CBG  Peripheral vascular disease/ HTN: Continue amlodipine, clonidine, labetalol, aspirin Continue to hold statin  PRN hydralazine.  GERD Continue PPI  Severe protein calorie malnutrition:  Ensure daily  Weakness and altered mental status.  Likely secondary to cholecystitis or UTI.  Resolved   Recent BKA right. May continue Robaxin and Percocet PRN Duda's note states "incision is clean minimal drainage, continue prosthetic shrinker, f/u in my office in 1 week" Will continue to monitor during hospital stay.  Diarrhea   due to stool softener, resolved  Pain management: When necessary Percocet, when necessary Dilaudid Activity: Consulted physical therapy home health per PT Bowel regimen: last BM 12/07/2015 Diet: Cardiac diet DVT Prophylaxis: subcutaneous Heparin  Advance goals of care discussion: Full code  Family Communication: no family was present at bedside, at the time of interview.   Disposition:  Discharge to home. Expected discharge date: 12/10/2015, postoperative recovery  Consultants: Nephrology, general surgery Procedures: Laparoscopic cholecystectomy  Antibiotics: Anti-infectives    Start     Dose/Rate Route Frequency Ordered Stop   12/08/15 1000   sulfamethoxazole-trimethoprim (  BACTRIM,SEPTRA) 400-80 MG per tablet 1 tablet  Status:  Discontinued     1 tablet Oral Every M-W-F 12/06/15 2220 12/07/15 1335   12/06/15 2300  cefTRIAXone (ROCEPHIN) 1 g in dextrose 5 % 50 mL IVPB     1 g 100 mL/hr over 30 Minutes Intravenous Every 24 hours 12/06/15 2222        Intake/Output Summary (Last 24 hours) at 12/09/15 1811 Last data filed at 12/09/15 1705  Gross per 24 hour  Intake    880 ml  Output    300 ml  Net    580 ml   Filed Weights   12/08/15 2014  Weight: 58.605 kg (129 lb 3.2 oz)    Objective: Physical Exam: Filed Vitals:   12/08/15 2250 12/09/15 0508 12/09/15 0819 12/09/15 1705  BP: 161/54 151/55 152/63 124/53  Pulse:  99 98 85  Temp:  99.4 F (37.4 C) 98.3 F (36.8 C) 99.2 F (37.3 C)  TempSrc:  Oral Oral Oral  Resp:  19 18 17   Weight:      SpO2:  97% 100% 100%   General: Alert awake and oriented, no acute distress postoperatively Eyes: PERRL, Conjunctiva normal Cardiovascular: S1 and S2 Present, no Murmur, Respiratory: Bilateral Air entry equal and Decreased, Clear to Auscultation, no Crackles, no wheezes Abdomen: Bowel Sound present, Soft and mild tenderness Skin: no redness, no Rash  Extremities: no Pedal edema, no calf tenderness  Data Reviewed: CBC:  Recent Labs Lab 12/06/15 1838 12/07/15 0205 12/08/15 0406 12/09/15 0459  WBC 4.0 4.3 4.1 3.9*  NEUTROABS 2.5  --  2.0 2.2  HGB 6.8* 8.3* 8.8* 9.0*  HCT 22.5* 26.0* 27.4* 28.5*  MCV 75.3* 75.4* 76.8* 77.0*  PLT 353 311 353 0000000   Basic Metabolic Panel:  Recent Labs Lab 12/06/15 1838 12/07/15 0205 12/08/15 0406 12/09/15 0459  NA 134* 133* 137 140  K 4.4 4.0 3.1* 3.5  CL 99* 98* 108 110  CO2 25 24 23 22   GLUCOSE 137* 141* 79 72  BUN 43* 44* 29* 18  CREATININE 1.73* 1.60* 0.95 0.82  CALCIUM 8.8* 8.6* 8.5* 8.2*  MG  --   --  2.5* 2.2  PHOS  --   --  3.6  --     Liver Function Tests:  Recent Labs Lab 12/06/15 1838 12/07/15 0205  12/08/15 0406 12/09/15 0459  AST 88* 62* 29 56*  ALT 113* 98* 62* 39  ALKPHOS 793* 754* 751* 593*  BILITOT 1.1 1.7* 0.4 0.5  PROT 5.6* 5.7* 5.5* 5.2*  ALBUMIN 2.2* 2.3* 2.1* 1.8*    Recent Labs Lab 12/07/15 1818  LIPASE 12    Recent Labs Lab 12/06/15 1838  AMMONIA 21   Coagulation Profile:  Recent Labs Lab 12/06/15 1838 12/08/15 0406  INR 1.50* 1.34   Cardiac Enzymes:  Recent Labs Lab 12/07/15 1818  CKTOTAL 40   BNP (last 3 results) No results for input(s): PROBNP in the last 8760 hours.  CBG:  Recent Labs Lab 12/09/15 0815 12/09/15 0855 12/09/15 1204 12/09/15 1700 12/09/15 1803  GLUCAP 68 78 283* 52* 72    Studies: No results found.   Scheduled Meds: . amLODipine  10 mg Oral QHS  . aspirin  81 mg Oral Daily  . cefTRIAXone (ROCEPHIN)  IV  1 g Intravenous Q24H  . cilostazol  100 mg Oral BID  . cloNIDine  0.2 mg Transdermal Q Fri  . feeding supplement (ENSURE ENLIVE)  237 mL Oral BID BM  .  insulin aspart  0-15 Units Subcutaneous TID WC  . insulin aspart  0-5 Units Subcutaneous QHS  . labetalol  300 mg Oral BID  . mycophenolate  360 mg Oral QPM  . mycophenolate  540 mg Oral q morning - 10a  . pantoprazole  40 mg Oral QHS  . tacrolimus  1.5 mg Oral BID   Continuous Infusions: . sodium chloride 10 mL/hr at 12/08/15 0823   PRN Meds: acetaminophen, hydrALAZINE, methocarbamol, oxyCODONE-acetaminophen, technetium TC 47M diethylenetriame-pentaacetic acid  Time spent: 30 minutes  Author: Grayland Ormond PA-S   If 7PM-7AM, please contact night-coverage at [email or web address], password [facility]

## 2015-12-10 DIAGNOSIS — D62 Acute posthemorrhagic anemia: Secondary | ICD-10-CM | POA: Diagnosis not present

## 2015-12-10 DIAGNOSIS — D638 Anemia in other chronic diseases classified elsewhere: Secondary | ICD-10-CM | POA: Diagnosis not present

## 2015-12-10 LAB — COMPREHENSIVE METABOLIC PANEL
ALT: 28 U/L (ref 14–54)
AST: 35 U/L (ref 15–41)
Albumin: 1.8 g/dL — ABNORMAL LOW (ref 3.5–5.0)
Alkaline Phosphatase: 612 U/L — ABNORMAL HIGH (ref 38–126)
Anion gap: 9 (ref 5–15)
BUN: 16 mg/dL (ref 6–20)
CO2: 21 mmol/L — ABNORMAL LOW (ref 22–32)
Calcium: 8.2 mg/dL — ABNORMAL LOW (ref 8.9–10.3)
Chloride: 108 mmol/L (ref 101–111)
Creatinine, Ser: 0.75 mg/dL (ref 0.44–1.00)
GFR calc Af Amer: >60 mL/min (ref >60)
GFR calc non Af Amer: >60 mL/min (ref >60)
Glucose, Bld: 50 mg/dL — ABNORMAL LOW (ref 65–99)
Potassium: 3.3 mmol/L — ABNORMAL LOW (ref 3.5–5.1)
Sodium: 138 mmol/L (ref 135–145)
Total Bilirubin: 0.5 mg/dL (ref 0.3–1.2)
Total Protein: 4.8 g/dL — ABNORMAL LOW (ref 6.5–8.1)

## 2015-12-10 LAB — TYPE AND SCREEN
ABO/RH(D): A POS
Antibody Screen: NEGATIVE
Unit division: 0
Unit division: 0

## 2015-12-10 LAB — CBC
HCT: 25.9 % — ABNORMAL LOW (ref 36.0–46.0)
Hemoglobin: 8.1 g/dL — ABNORMAL LOW (ref 12.0–15.0)
MCH: 24 pg — ABNORMAL LOW (ref 26.0–34.0)
MCHC: 31.3 g/dL (ref 30.0–36.0)
MCV: 76.9 fL — ABNORMAL LOW (ref 78.0–100.0)
Platelets: 303 10*3/uL (ref 150–400)
RBC: 3.37 MIL/uL — ABNORMAL LOW (ref 3.87–5.11)
RDW: 19.5 % — ABNORMAL HIGH (ref 11.5–15.5)
WBC: 4.9 10*3/uL (ref 4.0–10.5)

## 2015-12-10 LAB — GLUCOSE, CAPILLARY
Glucose-Capillary: 74 mg/dL (ref 65–99)
Glucose-Capillary: 101 mg/dL — ABNORMAL HIGH (ref 65–99)
Glucose-Capillary: 169 mg/dL — ABNORMAL HIGH (ref 65–99)
Glucose-Capillary: 126 mg/dL — ABNORMAL HIGH (ref 65–99)

## 2015-12-10 MED ORDER — POTASSIUM CHLORIDE CRYS ER 20 MEQ PO TBCR
40.0000 meq | EXTENDED_RELEASE_TABLET | Freq: Once | ORAL | Status: AC
  Administered 2015-12-10: 40 meq via ORAL
  Filled 2015-12-10: qty 2

## 2015-12-10 MED ORDER — POLYETHYLENE GLYCOL 3350 17 G PO PACK
17.0000 g | PACK | Freq: Every day | ORAL | Status: DC
  Filled 2015-12-10: qty 1

## 2015-12-10 NOTE — Evaluation (Addendum)
Occupational Therapy Evaluation Patient Details Name: [name] MRN: [id number] DOB: 1970-07-03 Today's Date: 12/10/2015    History of Present Illness pt is a 45 y/o female with pmh of DM, CHF, HTN ESRD, PVD, neuropathy, multiple amputations of foot to attempt limb salvage with ultimate R BKA on 6/14. pt was d/c'd to home, but immediately had mental status changes and weakness with inability to mobilize like the day before in hospital, so pt readmitted 6/21 and found to have cholecystitis, s/p lap chole on 6/23. Meanwhile pt has been inactive in the bed.   Clinical Impression   This 45 yo female admitted and underwent above presents to acute OT with deficits below (see OT problem list) thus affecting her PLOF of needing only A prn for basic ADLs per her report. She will benefit from acute OT with follow up [place] post D/C to get to a Mod I level.    Follow Up Recommendations  Home health OT;Supervision/Assistance - 24 hour    Equipment Recommendations  None recommended by OT       Precautions / Restrictions Precautions Precautions: Fall Restrictions Weight Bearing Restrictions: Yes RLE Weight Bearing: Non weight bearing      Mobility Bed Mobility               General bed mobility comments: Pt up in recliner upon arrival  Transfers Overall transfer level: Needs assistance Equipment used: Rolling walker (2 wheeled) Transfers: Sit to/from Stand Sit to Stand: Min guard         General transfer comment: Pt hopped 10 feet to chair>rested> then 10 feet back to recliner with min A and one LOB posteriorly that she needed A to correct (however it was noted that RW was on posterior angle and once this was corrected balance improved    Balance Overall balance assessment: Needs assistance Sitting-balance support: No upper extremity supported (LLE supported) Sitting balance-Leahy Scale: Good     Standing balance support: Bilateral upper extremity supported;During  functional activity Standing balance-Leahy Scale: Poor Standing balance comment: reliant on RW                            ADL Overall ADL's : Needs assistance/impaired Eating/Feeding: Sitting   Grooming: Set up;Sitting   Upper Body Bathing: Set up;Sitting   Lower Body Bathing: Min guard;Sit to/from stand   Upper Body Dressing : Set up;Sitting   Lower Body Dressing: Min guard;Sit to/from stand   Toilet Transfer: Minimal assistance;Ambulation;RW Toilet Transfer Details (indicate cue type and reason): recliner>chair 10 feet away>rest>back to recliner Toileting- Clothing Manipulation and Hygiene: Min guard;Sit to/from stand                         Pertinent Vitals/Pain Pain Assessment: No/denies pain     Hand Dominance Left   Extremity/Trunk Assessment Upper Extremity Assessment Upper Extremity Assessment: Overall WFL for tasks assessed           Communication Communication Communication: No difficulties   Cognition Arousal/Alertness: Awake/alert Behavior During Therapy: WFL for tasks assessed/performed Overall Cognitive Status: Within Functional Limits for tasks assessed                                Home Living Family/patient expects to be discharged to:: Private residence Living Arrangements: Parent Available Help at Discharge: Family;Available 24 hours/day Type of Home: [place]  Access: Ramped entrance   Entrance Stairs-Rails: Left Home Layout: Two level;Able to live on main level with bedroom/bathroom     Bathroom Shower/Tub: Teacher, early years/pre: Standard     Home Equipment: Environmental consultant - 4 wheels;Wheelchair - [employer];Tub bench          Prior Functioning/Environment Level of Independence: Independent with assistive device(s)  Gait / Transfers Assistance Needed: using rw in home and w/c for community distances ADL's / Homemaking Assistance Needed: Independent with selfcare        OT  Diagnosis: Generalized weakness   OT Problem List: Decreased strength;Impaired balance (sitting and/or standing)   OT Treatment/Interventions: Self-care/ADL training;Patient/family education;Therapeutic activities;DME and/or AE instruction    OT Goals(Current goals can be found in the care plan section) Acute Rehab OT Goals Patient Stated Goal: home probably tomorrow OT Goal Formulation: With patient Time For Goal Achievement: 12/17/15 Potential to Achieve Goals: Good ADL Goals Pt Will Perform Grooming: with set-up;with supervision;standing (2 tasks at sink) Pt Will Transfer to Toilet: with min guard assist;ambulating;bedside commode (over toilet) Pt Will Perform Toileting - Clothing Manipulation and hygiene: with supervision;sit to/from stand Additional ADL Goal #1: Pt will be S for in and OOB with HOB flat and no rail  OT Frequency: Min 2X/week              End of Session Equipment Utilized During Treatment: Gait belt;Rolling walker  Activity Tolerance: Patient tolerated treatment well Patient left: in chair;with call bell/phone within reach (no alarm hooked up upon my arrival)   Time: 1440-1500 OT Time Calculation (min): 20 min Charges:  OT General Charges $OT Visit: 1 Procedure OT Evaluation $OT Eval Moderate Complexity: 1 Procedure G-Codes: OT G-codes **NOT FOR INPATIENT CLASS** Functional Assessment Tool Used: Clinical observation Functional Limitation: Self care Self Care Current Status [id number]): At least 1 percent but less than 20 percent impaired, limited or restricted Self Care Goal Status [id number]): At least 1 percent but less than 20 percent impaired, limited or restricted  Almon Register  [id number]  12/10/2015, 3:26 PM

## 2015-12-10 NOTE — Discharge Instructions (Signed)
[facility] ______[facility] °LAPAROSCOPIC SURGERY: POST OP INSTRUCTIONS °Always review your discharge instruction sheet given to you by the facility where your surgery was performed. °IF YOU HAVE DISABILITY OR FAMILY LEAVE FORMS, YOU MUST BRING THEM TO THE OFFICE FOR PROCESSING.   °DO NOT GIVE THEM TO YOUR DOCTOR. ° °1. A prescription for pain medication may be given to you upon discharge.  Take your pain medication as prescribed, if needed.  If narcotic pain medicine is not needed, then you may take acetaminophen (Tylenol) or ibuprofen (Advil) as needed. °2. Take your usually prescribed medications unless otherwise directed. °3. If you need a refill on your pain medication, please contact your pharmacy.  They will contact our office to request authorization. Prescriptions will not be filled after 5pm or on week-ends. °4. You should follow a light diet the first few days after arrival home, such as soup and crackers, etc.  Be sure to include lots of fluids daily. °5. Most patients will experience some swelling and bruising in the area of the incisions.  Ice packs will help.  Swelling and bruising can take several days to resolve.  °6. It is common to experience some constipation if taking pain medication after surgery.  Increasing fluid intake and taking a stool softener (such as Colace) will usually help or prevent this problem from occurring.  A mild laxative (Milk of Magnesia or Miralax) should be taken according to package instructions if there are no bowel movements after 48 hours. °7. Unless discharge instructions indicate otherwise, you may remove your bandages 24-48 hours after surgery, and you may shower at that time.  You may have steri-strips (small skin tapes) in place directly over the incision.  These strips should be left on the skin for 7-10 days.  If your surgeon used skin glue on the incision, you may shower in 24 hours.  The glue will flake off over the next 2-3 weeks.  Any sutures or  staples will be removed at the office during your follow-up visit. °8. ACTIVITIES:  You may resume regular (light) daily activities beginning the next day--such as daily self-care, walking, climbing stairs--gradually increasing activities as tolerated.  You may have sexual intercourse when it is comfortable.  Refrain from any heavy lifting or straining until approved by your doctor. °a. You may drive when you are no longer taking prescription pain medication, you can comfortably wear a seatbelt, and you can safely maneuver your car and apply brakes. °b. RETURN TO WORK:  __________________________________________________________ °9. You should see your doctor in the office for a follow-up appointment approximately 2-3 weeks after your surgery.  Make sure that you call for this appointment within a day or two after you arrive home to insure a convenient appointment time. °10. OTHER INSTRUCTIONS: __________________________________________________________________________________________________________________________ __________________________________________________________________________________________________________________________ °WHEN TO CALL YOUR DOCTOR: °1. Fever over 101.0 °2. Inability to urinate °3. Continued bleeding from incision. °4. Increased pain, redness, or drainage from the incision. °5. Increasing abdominal pain ° °The clinic staff is available to answer your questions during regular business hours.  Please don’t hesitate to call and ask to speak to one of the nurses for clinical concerns.  If you have a medical emergency, go to the nearest emergency room or call 911.  A surgeon from [facility] is always on call at the hospital. °[address], [address], [place], [place]  [address] ? [address] [address], [place],    [address] °[phone] ? [phone] ? FAX [phone] °Web site:   [email or web address]       Migraine Headache A migraine headache is an  intense, throbbing pain on one or both sides of your head. A migraine can last for 30 minutes to several hours. CAUSES  The exact cause of a migraine headache is not always known. However, a migraine may be caused when nerves in the brain become irritated and release chemicals that cause inflammation. This causes pain. Certain things may also trigger migraines, such as:  Alcohol.  Smoking.  Stress.  Menstruation.  Aged cheeses.  Foods or drinks that contain nitrates, glutamate, aspartame, or tyramine.  Lack of sleep.  Chocolate.  Caffeine.  Hunger.  Physical exertion.  Fatigue.  Medicines used to treat chest pain (nitroglycerine), birth control pills, estrogen, and some blood pressure medicines. SIGNS AND SYMPTOMS  Pain on one or both sides of your head.  Pulsating or throbbing pain.  Severe pain that prevents daily activities.  Pain that is aggravated by any physical activity.  Nausea, vomiting, or both.  Dizziness.  Pain with exposure to bright lights, loud noises, or activity.  General sensitivity to bright lights, loud noises, or smells. Before you get a migraine, you may get warning signs that a migraine is coming (aura). An aura may include:  Seeing flashing lights.  Seeing bright spots, halos, or zigzag lines.  Having tunnel vision or blurred vision.  Having feelings of numbness or tingling.  Having trouble talking.  Having muscle weakness. DIAGNOSIS  A migraine headache is often diagnosed based on:  Symptoms.  Physical exam.  A CT scan or MRI of your head. These imaging tests cannot diagnose migraines, but they can help rule out other causes of headaches. TREATMENT Medicines may be given for pain and nausea. Medicines can also be given to help prevent recurrent migraines.  HOME CARE INSTRUCTIONS  Only take over-the-counter or prescription medicines for pain or discomfort as directed by your health care provider. The use of long-term  narcotics is not recommended.  Lie down in a dark, quiet room when you have a migraine.  Keep a journal to find out what may trigger your migraine headaches. For example, write down:  What you eat and drink.  How much sleep you get.  Any change to your diet or medicines.  Limit alcohol consumption.  Quit smoking if you smoke.  Get 7-9 hours of sleep, or as recommended by your health care provider.  Limit stress.  Keep lights dim if bright lights bother you and make your migraines worse. SEEK IMMEDIATE MEDICAL CARE IF:   Your migraine becomes severe.  You have a fever.  You have a stiff neck.  You have vision loss.  You have muscular weakness or loss of muscle control.  You start losing your balance or have trouble walking.  You feel faint or pass out.  You have severe symptoms that are different from your first symptoms. MAKE SURE YOU:   Understand these instructions.  Will watch your condition.  Will get help right away if you are not doing well or get worse.   This information is not intended to replace advice given to you by your health care provider. Make sure you discuss any questions you have with your health care provider.   Document Released: 06/03/2005 Document Revised: 06/24/2014 Document Reviewed: 02/08/2013 Elsevier Interactive Patient Education [employer].

## 2015-12-10 NOTE — Progress Notes (Signed)
[facility] Progress Note  Patient: [name] [id number]   PCP: Benito Mccreedy, MD DOB: 07-28-1970   DOA: 12/06/2015   DOS: 12/10/2015   Date of Service: the patient was seen and examined on 12/10/2015  Subjective: Small BM yesterday. Minimal oral intake. No other acute complaint. Nutrition: Clear liquid diet tolerating it well  Brief hospital course: Pt. with PMH of ESRD s/p renal transplant in 2014, IDDM, PVD with BKA on 11/29/2015, and anemia secondary to CKD ; admitted on 12/06/2015, with complaint of weakness, confusion, upper abdominal pain, nausea, vomiting, and diarrhea. Was found to have a Hb of 6.8 and transfused 1 unit of PRBC. Elevated LFTs. U/S confirms cholecystitis. Urine shows UTI and started of ceftriaxone. Consulted [facility] transplant, [facility] will manage care. Cholecystectomy preformed 12/08/2015.  Currently further plan is to continue current management and monitor symptoms.  Assessment and Plan: Acute Cholecystitis: RUQ U/s shows gallbladder thickening with sludge. No dilatation of the bile ducts LFTs are elevated, Total bilirubin 1.7, PT 18.2, INR 1.5 Placed on IV ceftriaxone and IVFs General surgery preformed cholecystectomy 12/08/2015. Advance to soft diet. Ambulate and use incentive spirometry. Will monitor for improvement in oral intake  Acute blood loss anemia on chronic anemia of CKD:  Most likely Secondary to recent BKA on 6/14  Transfused total of 1 unit of PRBC this hospitilization Continue to monitor with CBC, H&H today dropped the ground. Continue home iron supplementation FOBT awaiting   Acute kidney injury, resolved Baseline Cr 1.0  Kidney transplant in 2014, on Prograft, mycophenolate and Bactrim. Will hold medications for possible toxicity and in the setting on ongoing infection. Prograft level pending. Transplant surgery at [facility] recommended pt can continue to have her nephrology care here. Consulted Nephrology, IV fluids now  stopped.  Acute Hypoxia 93% spo2 on ED presentation. Placed on 2 L O2 nasal cannula. Not on O2 at baseline. 95% spo2 today on 2 L. BNP is elevated but currently has AKI, does not appear clinically volume overloaded. ECHO on 10/14/15 shows EF of 65-70% D-dimer is 3.78, VQ scan negative. Echocardiogram pending for cardiomegaly.  UTI: Pyuria and bacteriuria with recent hospitalization. immunosupressed  Urine culture insignificant quantities of gram negative rods. Continue Ceftriaxone 1 g daily finish seven-day antibiotic treatment course.  IDDM: Stop Levemir, continue sliding scale corrections. On Levemir 14 and 22 units at home. Continue to monitor CBG  Peripheral vascular disease/ HTN: Continue amlodipine, clonidine, labetalol, aspirin Continue to hold statin  PRN hydralazine.  GERD Continue PPI  Severe protein calorie malnutrition:  Ensure daily  Weakness and altered mental status.  Likely secondary to cholecystitis or UTI.  Resolved   Recent BKA right. May continue Robaxin and Percocet PRN Duda's note states "incision is clean minimal drainage, continue prosthetic shrinker, f/u in my office in 1 week" Will continue to monitor during hospital stay.  Diarrhea   due to stool softener, resolved  Pain management: When necessary Percocet, when necessary Dilaudid Activity: Consulted physical therapy home health per PT Bowel regimen: last BM 12/07/2015 Diet: Cardiac diet DVT Prophylaxis: subcutaneous Heparin  Advance goals of care discussion: Full code  Family Communication: no family was present at bedside, at the time of interview.   Disposition:  Discharge to home. Expected discharge date: 12/11/2015, postoperative recovery  Consultants: Nephrology, general surgery Procedures: Laparoscopic cholecystectomy  Antibiotics: Anti-infectives    Start     Dose/Rate Route Frequency Ordered Stop   12/08/15 1000  sulfamethoxazole-trimethoprim (BACTRIM,SEPTRA) 400-80  MG per tablet 1 tablet  Status:  Discontinued     1 tablet Oral Every M-W-F 12/06/15 2220 12/07/15 1335   12/06/15 2300  cefTRIAXone (ROCEPHIN) 1 g in dextrose 5 % 50 mL IVPB     1 g 100 mL/hr over 30 Minutes Intravenous Every 24 hours 12/06/15 2222        Intake/Output Summary (Last 24 hours) at 12/10/15 1654 Last data filed at 12/10/15 1426  Gross per 24 hour  Intake    710 ml  Output      0 ml  Net    710 ml   Filed Weights   12/08/15 2014 12/09/15 2041  Weight: 58.605 kg (129 lb 3.2 oz) 61.462 kg (135 lb 8 oz)    Objective: Physical Exam: Filed Vitals:   12/09/15 2041 12/10/15 0439 12/10/15 0802 12/10/15 1633  BP: 121/55 143/60 141/58 137/49  Pulse: 88 85 86 89  Temp: 98.8 F (37.1 C) 98.3 F (36.8 C) 98.3 F (36.8 C) 98.8 F (37.1 C)  TempSrc: Oral Oral Oral Oral  Resp: 18 16 16 18   Weight: 61.462 kg (135 lb 8 oz)     SpO2: 95% 100% 100% 100%   General: Alert awake and oriented, no acute distress postoperatively Eyes: PERRL, Conjunctiva normal Cardiovascular: S1 and S2 Present, no Murmur, Respiratory: Bilateral Air entry equal and Decreased, Clear to Auscultation, no Crackles, no wheezes Abdomen: Bowel Sound present, Soft and mild tenderness Skin: no redness, no Rash  Extremities: no Pedal edema, no calf tenderness  Data Reviewed: CBC:  Recent Labs Lab 12/06/15 1838 12/07/15 0205 12/08/15 0406 12/09/15 0459 12/10/15 0531  WBC 4.0 4.3 4.1 3.9* 4.9  NEUTROABS 2.5  --  2.0 2.2  --   HGB 6.8* 8.3* 8.8* 9.0* 8.1*  HCT 22.5* 26.0* 27.4* 28.5* 25.9*  MCV 75.3* 75.4* 76.8* 77.0* 76.9*  PLT 353 311 353 369 [id number]   Basic Metabolic Panel:  Recent Labs Lab 12/06/15 1838 12/07/15 0205 12/08/15 0406 12/09/15 0459 12/10/15 0531  NA 134* 133* 137 140 138  K 4.4 4.0 3.1* 3.5 3.3*  CL 99* 98* 108 110 108  CO2 25 24 23 22  21*  GLUCOSE 137* 141* 79 72 50*  BUN 43* 44* 29* 18 16  CREATININE 1.73* 1.60* 0.95 0.82 0.75  CALCIUM 8.8* 8.6* 8.5* 8.2* 8.2*  MG   --   --  2.5* 2.2  --   PHOS  --   --  3.6  --   --     Liver Function Tests:  Recent Labs Lab 12/06/15 1838 12/07/15 0205 12/08/15 0406 12/09/15 0459 12/10/15 0531  AST 88* 62* 29 56* 35  ALT 113* 98* 62* 39 28  ALKPHOS 793* 754* 751* 593* 612*  BILITOT 1.1 1.7* 0.4 0.5 0.5  PROT 5.6* 5.7* 5.5* 5.2* 4.8*  ALBUMIN 2.2* 2.3* 2.1* 1.8* 1.8*    Recent Labs Lab 12/07/15 1818  LIPASE 12    Recent Labs Lab 12/06/15 1838  AMMONIA 21   Coagulation Profile:  Recent Labs Lab 12/06/15 1838 12/08/15 0406  INR 1.50* 1.34   Cardiac Enzymes:  Recent Labs Lab 12/07/15 1818  CKTOTAL 40   BNP (last 3 results) No results for input(s): PROBNP in the last 8760 hours.  CBG:  Recent Labs Lab 12/09/15 1700 12/09/15 1803 12/09/15 2038 12/10/15 0801 12/10/15 1220  GLUCAP 52* 72 98 74 101*    Studies: No results found.   Scheduled Meds: . amLODipine  10 mg Oral QHS  . aspirin  81 mg  Oral Daily  . cefTRIAXone (ROCEPHIN)  IV  1 g Intravenous Q24H  . cilostazol  100 mg Oral BID  . cloNIDine  0.2 mg Transdermal Q Fri  . feeding supplement (ENSURE ENLIVE)  237 mL Oral BID BM  . insulin aspart  0-15 Units Subcutaneous TID WC  . insulin aspart  0-5 Units Subcutaneous QHS  . labetalol  300 mg Oral BID  . mycophenolate  360 mg Oral QPM  . mycophenolate  540 mg Oral q morning - 10a  . pantoprazole  40 mg Oral QHS  . polyethylene glycol  17 g Oral Daily  . tacrolimus  1.5 mg Oral BID   Continuous Infusions:   PRN Meds: acetaminophen, hydrALAZINE, methocarbamol, oxyCODONE-acetaminophen, technetium TC 62M diethylenetriame-pentaacetic acid  Time spent: 30 minutes  Author: Grayland Ormond PA-S   If 7PM-7AM, please contact night-coverage at [email or web address], password [facility]

## 2015-12-10 NOTE — Progress Notes (Signed)
2 Days Post-Op  Subjective: Alert and stable.  In no distress.  Denies abdominal pain. Had a bowel movement yesterday Tolerating solid diet Labs this morning reveal hemoglobin 8.1.  WBC 4.9.  Begin working with physical therapy yesterday.  Sat on edge of bed and stood up beside bed on one leg. When discharge will need home health PT   Objective: Vital signs in last 24 hours: Temp:  [98.3 F (36.8 C)-99.2 F (37.3 C)] 98.3 F (36.8 C) (06/25 0439) Pulse Rate:  [85-98] 85 (06/25 0439) Resp:  [16-18] 16 (06/25 0439) BP: (121-152)/(53-63) 143/60 mmHg (06/25 0439) SpO2:  [95 %-100 %] 100 % (06/25 0439) Weight:  [61.462 kg (135 lb 8 oz)] 61.462 kg (135 lb 8 oz) (06/24 2041) Last BM Date: 12/09/15  Intake/Output from previous day: 06/24 0701 - 06/25 0700 In: 1070 [P.O.:1020; IV Piggyback:50] Out: 0  Intake/Output this shift: Total I/O In: 110 [P.O.:60; IV Piggyback:50] Out: 0   EXAM: General appearance: Alert. No distress. Pleasant and cooperative. Resp: clear to auscultation bilaterally GI: Soft. Nondistended. Wounds look good. Minimally tender. Benign postop exam.   Lab Results:  Results for orders placed or performed during the hospital encounter of 12/06/15 (from the past 24 hour(s))  Glucose, capillary     Status: None   Collection Time: 12/09/15  8:15 AM  Result Value Ref Range   Glucose-Capillary 68 65 - 99 mg/dL  Glucose, capillary     Status: None   Collection Time: 12/09/15  8:55 AM  Result Value Ref Range   Glucose-Capillary 78 65 - 99 mg/dL  Glucose, capillary     Status: Abnormal   Collection Time: 12/09/15 12:04 PM  Result Value Ref Range   Glucose-Capillary 283 (H) 65 - 99 mg/dL  Glucose, capillary     Status: Abnormal   Collection Time: 12/09/15  5:00 PM  Result Value Ref Range   Glucose-Capillary 52 (L) 65 - 99 mg/dL  Glucose, capillary     Status: None   Collection Time: 12/09/15  6:03 PM  Result Value Ref Range   Glucose-Capillary 72 65 -  99 mg/dL  Glucose, capillary     Status: None   Collection Time: 12/09/15  8:38 PM  Result Value Ref Range   Glucose-Capillary 98 65 - 99 mg/dL     Studies/Results: No results found.  Marland Kitchen amLODipine  10 mg Oral QHS  . aspirin  81 mg Oral Daily  . cefTRIAXone (ROCEPHIN)  IV  1 g Intravenous Q24H  . cilostazol  100 mg Oral BID  . cloNIDine  0.2 mg Transdermal Q Fri  . feeding supplement (ENSURE ENLIVE)  237 mL Oral BID BM  . insulin aspart  0-15 Units Subcutaneous TID WC  . insulin aspart  0-5 Units Subcutaneous QHS  . labetalol  300 mg Oral BID  . mycophenolate  360 mg Oral QPM  . mycophenolate  540 mg Oral q morning - 10a  . pantoprazole  40 mg Oral QHS  . tacrolimus  1.5 mg Oral BID     Assessment/Plan: s/p Procedure(s): LAPAROSCOPIC CHOLECYSTECTOMY WITH INTRAOPERATIVE CHOLANGIOGRAM  POD #2. Laparoscopic cholecystectomy with cholangiogram. Cholangiogram was normal Doing well from surgical perspective. Meets discharge criteria from our standpoint I have entered postop instructions and follow-up instructions into ABS To see Dr. Barry Dienes in 3 weeks  Recent BKA, Dr. Sharol Given. Will need HHPT.  Multiple medical problems.  Being managed by [facility].  @PROBHOSP @  LOS: 4 days    [name] M 12/10/2015  . Marland Kitchen  prob

## 2015-12-10 NOTE — Progress Notes (Signed)
Patient on  stand by assisted when got herself out from the bed ,able to to do it by herself with directions.Seated in a recliner chair,off from oxygen since this 8:00 a.m,room air saturation is maintaining at 93%.No complaint of pain/discomfort.No dizziness when got herself out from the bed.

## 2015-12-11 ENCOUNTER — Encounter (HOSPITAL_COMMUNITY): Payer: Self-pay | Admitting: General Surgery

## 2015-12-11 DIAGNOSIS — D638 Anemia in other chronic diseases classified elsewhere: Secondary | ICD-10-CM | POA: Diagnosis not present

## 2015-12-11 DIAGNOSIS — D62 Acute posthemorrhagic anemia: Secondary | ICD-10-CM | POA: Diagnosis not present

## 2015-12-11 DIAGNOSIS — K810 Acute cholecystitis: Secondary | ICD-10-CM | POA: Diagnosis not present

## 2015-12-11 DIAGNOSIS — J9601 Acute respiratory failure with hypoxia: Secondary | ICD-10-CM | POA: Diagnosis not present

## 2015-12-11 DIAGNOSIS — N189 Chronic kidney disease, unspecified: Secondary | ICD-10-CM | POA: Diagnosis not present

## 2015-12-11 DIAGNOSIS — E43 Unspecified severe protein-calorie malnutrition: Secondary | ICD-10-CM | POA: Diagnosis not present

## 2015-12-11 DIAGNOSIS — I10 Essential (primary) hypertension: Secondary | ICD-10-CM | POA: Diagnosis not present

## 2015-12-11 DIAGNOSIS — Z940 Kidney transplant status: Secondary | ICD-10-CM | POA: Diagnosis not present

## 2015-12-11 DIAGNOSIS — N179 Acute kidney failure, unspecified: Secondary | ICD-10-CM | POA: Diagnosis not present

## 2015-12-11 DIAGNOSIS — N390 Urinary tract infection, site not specified: Secondary | ICD-10-CM | POA: Diagnosis not present

## 2015-12-11 DIAGNOSIS — Z89511 Acquired absence of right leg below knee: Secondary | ICD-10-CM | POA: Diagnosis not present

## 2015-12-11 LAB — CBC
HCT: 27.5 % — ABNORMAL LOW (ref 36.0–46.0)
Hemoglobin: 8.5 g/dL — ABNORMAL LOW (ref 12.0–15.0)
MCH: 24 pg — ABNORMAL LOW (ref 26.0–34.0)
MCHC: 30.9 g/dL (ref 30.0–36.0)
MCV: 77.7 fL — ABNORMAL LOW (ref 78.0–100.0)
Platelets: 336 10*3/uL (ref 150–400)
RBC: 3.54 MIL/uL — ABNORMAL LOW (ref 3.87–5.11)
RDW: 19.5 % — ABNORMAL HIGH (ref 11.5–15.5)
WBC: 4.3 10*3/uL (ref 4.0–10.5)

## 2015-12-11 LAB — BASIC METABOLIC PANEL
Anion gap: 7 (ref 5–15)
BUN: 11 mg/dL (ref 6–20)
CO2: 23 mmol/L (ref 22–32)
Calcium: 8.1 mg/dL — ABNORMAL LOW (ref 8.9–10.3)
Chloride: 107 mmol/L (ref 101–111)
Creatinine, Ser: 0.78 mg/dL (ref 0.44–1.00)
GFR calc Af Amer: >60 mL/min (ref >60)
GFR calc non Af Amer: >60 mL/min (ref >60)
Glucose, Bld: 148 mg/dL — ABNORMAL HIGH (ref 65–99)
Potassium: 3.3 mmol/L — ABNORMAL LOW (ref 3.5–5.1)
Sodium: 137 mmol/L (ref 135–145)

## 2015-12-11 LAB — GLUCOSE, CAPILLARY
Glucose-Capillary: 149 mg/dL — ABNORMAL HIGH (ref 65–99)
Glucose-Capillary: 146 mg/dL — ABNORMAL HIGH (ref 65–99)

## 2015-12-11 LAB — MAGNESIUM: Magnesium: 1.9 mg/dL (ref 1.7–2.4)

## 2015-12-11 MED ORDER — INSULIN DETEMIR 100 UNIT/ML ~~LOC~~ SOLN: 6.0000 [IU] | Freq: Every day | SUBCUTANEOUS | Status: DC

## 2015-12-11 MED ORDER — POLYETHYLENE GLYCOL 3350 17 G PO PACK: 17.0000 g | PACK | Freq: Every day | ORAL | Status: DC | PRN

## 2015-12-11 MED ORDER — BACID PO TABS
2.0000 | ORAL_TABLET | Freq: Three times a day (TID) | ORAL | Status: DC
  Administered 2015-12-11: 2 via ORAL
  Filled 2015-12-11 (×2): qty 2

## 2015-12-11 MED ORDER — BACID PO TABS: 2.0000 | ORAL_TABLET | Freq: Three times a day (TID) | ORAL | Status: DC

## 2015-12-11 MED ORDER — CEPHALEXIN 500 MG PO CAPS: 500.0000 mg | ORAL_CAPSULE | Freq: Two times a day (BID) | ORAL | Status: DC

## 2015-12-11 MED ORDER — ENSURE ENLIVE PO LIQD: 237.0000 mL | Freq: Two times a day (BID) | ORAL | Status: DC

## 2015-12-11 MED ORDER — POLYETHYLENE GLYCOL 3350 17 G PO PACK: 17.0000 g | PACK | Freq: Every day | ORAL | Status: DC

## 2015-12-11 NOTE — Progress Notes (Signed)
NT arrived back on unit after discharging patient.  NT stated the following:  "that the patient was trying to get into her family's vehicle and fell to the ground.  The patient's family member helped the patient into the vehicle.  The patient denied being hurt.  The patient denied any treatment.  The patient stated that they just wanted to go home."  Jillyn Ledger, MBA, BSN, RN

## 2015-12-11 NOTE — NC FL2 (Signed)
[place] MEDICAID FL2 LEVEL OF CARE SCREENING TOOL     IDENTIFICATION  Patient Name: [name] Birthdate: 01-04-1971 Sex: female Admission Date (Current Location): 12/06/2015  [facility] and [place] Number:  Herbalist and Address:  [facility] [place], [place] [address] [place], [place] [address]      Provider Number: [id number]  Attending Physician Name and Address:  Lavina Hamman, MD  Relative Name and Phone Number:       Current Level of Care: Hospital Recommended Level of Care: Nursing Facility Prior Approval Number:    Date Approved/Denied:   PASRR Number: [id number]  Discharge Plan: SNF    Current Diagnoses: Patient Active Problem List   Diagnosis Date Noted  . Transaminitis 12/06/2015  . Acute on chronic renal failure ([place]) 12/06/2015  . History of renal transplant 12/06/2015  . UTI (lower urinary tract infection) 12/06/2015  . Acute cholecystitis 12/06/2015  . Below knee amputation status ([place]) 11/29/2015  . Asystole ([place])   . S/P unilateral BKA (below knee amputation) ([place])   . Pericardial effusion   . Type 2 diabetes mellitus with peripheral neuropathy (HCC)   . Chronic kidney disease, stage IV (severe) ([place])   . Essential hypertension   . Anemia of chronic disease   . Acute blood loss anemia   . 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 Critical lower limb ischemia 10/05/2015  . PAD (peripheral artery disease) ([place]) 09/18/2015  . Leukopenia 08/22/2014  . Protein-calorie malnutrition, severe ([place]) 12/24/2012  . Gastric ulcer 12/24/2012  . Mechanical complication of other vascular device, implant, and graft 09/22/2012  . End stage renal disease ([place]) 10/23/2011  . Physical deconditioning 10/09/2011  . HTN (hypertension), malignant 09/27/2011  . Chronic kidney disease, stage 4, severely decreased GFR (HCC) 09/27/2011  . PNA (pneumonia) 09/27/2011  . Acute respiratory failure with hypoxia ([place])  09/27/2011  . VOMITING 12/06/2009  . GOUT 12/21/2008  . Uncontrolled type 2 diabetes mellitus with peripheral artery disease ([place]) 08/14/2006  . HYPERLIPIDEMIA 08/14/2006  . OBESITY, NOS 08/14/2006  . FIBROADENOSIS, BREAST 08/14/2006  . Irregular menstrual cycle 08/14/2006    Orientation RESPIRATION BLADDER Height & Weight     Self, Time, Situation, Place  Normal Continent Weight: 136 lb 6.4 oz (61.871 kg) Height:  5' (152.4 cm)  BEHAVIORAL SYMPTOMS/MOOD NEUROLOGICAL BOWEL NUTRITION STATUS      Continent Diet (low sodium heart healthy)  AMBULATORY STATUS COMMUNICATION OF NEEDS Skin   Limited Assist Verbally Surgical wounds                       Personal Care Assistance Level of Assistance  Bathing, Dressing Bathing Assistance: Limited assistance   Dressing Assistance: Limited assistance     Functional Limitations Info             SPECIAL CARE FACTORS FREQUENCY  PT (By licensed PT), OT (By licensed OT)     PT Frequency: daily OT Frequency: daily            Contractures Contractures Info: Not present    Additional Factors Info  Allergies   Allergies Info: NKA           Current Medications (12/11/2015):  This is the current hospital active medication list Current Facility-Administered Medications  Medication Dose Route Frequency Provider Last Rate Last Dose  . acetaminophen (TYLENOL) tablet 650 mg  650 mg Oral Q6H PRN Edwin Dada, MD      .  amLODipine (NORVASC) tablet 10 mg  10 mg Oral QHS Edwin Dada, MD   10 mg at 12/10/15 2208  . aspirin EC tablet 81 mg  81 mg Oral Daily Edwin Dada, MD   81 mg at 12/11/15 1036  . cefTRIAXone (ROCEPHIN) 1 g in dextrose 5 % 50 mL IVPB  1 g Intravenous Q24H Edwin Dada, MD   1 g at 12/10/15 2225  . cilostazol (PLETAL) tablet 100 mg  100 mg Oral BID Edwin Dada, MD   100 mg at 12/11/15 1036  . cloNIDine (CATAPRES - Dosed in mg/24 hr) patch 0.2 mg  0.2 mg Transdermal Q  Fri Edwin Dada, MD      . feeding supplement (ENSURE ENLIVE) (ENSURE ENLIVE) liquid 237 mL  237 mL Oral BID BM Edwin Dada, MD   237 mL at 12/09/15 1137  . hydrALAZINE (APRESOLINE) injection 20 mg  20 mg Intravenous Q6H PRN Dianne Dun, NP   20 mg at 12/08/15 2213  . insulin aspart (novoLOG) injection 0-15 Units  0-15 Units Subcutaneous TID WC Edwin Dada, MD   2 Units at 12/11/15 [phone]  . insulin aspart (novoLOG) injection 0-5 Units  0-5 Units Subcutaneous QHS Edwin Dada, MD   0 Units at 12/07/15 0034  . labetalol (NORMODYNE) tablet 300 mg  300 mg Oral BID Edwin Dada, MD   300 mg at 12/11/15 1036  . methocarbamol (ROBAXIN) tablet 500 mg  500 mg Oral Q8H PRN Edwin Dada, MD   500 mg at 12/07/15 0031  . mycophenolate (MYFORTIC) EC tablet 360 mg  360 mg Oral QPM Mauricia Area, MD   360 mg at 12/10/15 1749  . mycophenolate (MYFORTIC) EC tablet 540 mg  540 mg Oral q morning - 10a Lavina Hamman, MD   540 mg at 12/11/15 1036  . oxyCODONE-acetaminophen (PERCOCET/ROXICET) 5-325 MG per tablet 1 tablet  1 tablet Oral Q4H PRN Edwin Dada, MD   1 tablet at 12/08/15 2111  . pantoprazole (PROTONIX) EC tablet 40 mg  40 mg Oral QHS Edwin Dada, MD   40 mg at 12/10/15 2206  . polyethylene glycol (MIRALAX / GLYCOLAX) packet 17 g  17 g Oral Daily Lavina Hamman, MD   17 g at 12/10/15 1655  . tacrolimus (PROGRAF) capsule 1.5 mg  1.5 mg Oral BID Mauricia Area, MD   1.5 mg at 12/11/15 1036  . technetium TC 52M diethylenetriame-pentaacetic acid (DTPA) injection 32.2 milli Curie  32.2 milli Curie Inhalation Once PRN Richardean Sale, MD         Discharge Medications: Please see discharge summary for a list of discharge medications.  Relevant Imaging Results:  Relevant Lab Results:   Additional Information SSN [id number]  Dulcy Fanny, [place]

## 2015-12-11 NOTE — Care Management Note (Addendum)
Case Management Note  Patient Details  Name: [name] MRN: [id number] Date of Birth: 09-07-1970  Subjective/Objective:    CM following for progression and d/c planning.                 Action/Plan: 12/11/2015 Met with pt who has used [facility] in the past and wishes to continue with that agency. [facility] rep, D Wilkie notified of [place] and [place] orders. Pt has no DME needs.  3pm [facility] notified of need for additional services due to elderly parents caregivers.  Expected Discharge Date:     12/11/2015             Expected Discharge Plan:  [place]  In-House Referral:  NA  Discharge planning Services  CM Consult  Post Acute Care Choice:  Home Health Choice offered to:  Patient  DME Arranged:   NA DME Agency:   NA  HH Arranged:  RN, PT [place] aide and Social worker added to services. [place] Agency:  Other - See comment ([place])  Status of Service:  Completed, signed off  If discussed at [place] of Stay Meetings, dates discussed:    Additional Comments:  Adron Bene, RN 12/11/2015, 9:44 AM

## 2015-12-11 NOTE — Clinical Social Work Placement (Signed)
   CLINICAL SOCIAL WORK PLACEMENT  NOTE  Date:  12/11/2015  Patient Details  Name: [name] MRN: [id number] Date of Birth: 1971/03/10  Clinical Social Work is seeking post-discharge placement for this patient at the [place] level of care (*CSW will initial, date and re-position this form in  chart as items are completed):  Yes   Patient/family provided with [place] Work Department's list of facilities offering this level of care within the geographic area requested by the patient (or if unable, by the patient's family).  Yes   Patient/family informed of their freedom to choose among providers that offer the needed level of care, that participate in Medicare, Medicaid or managed care program needed by the patient, have an available bed and are willing to accept the patient.  Yes   Patient/family informed of [facility]'s ownership interest in [facility] and [facility], as well as of the fact that they are under no obligation to receive care at these facilities.  PASRR submitted to EDS on       PASRR number received on       Existing PASRR number confirmed on 12/11/15     FL2 transmitted to all facilities in geographic area requested by pt/family on 12/11/15     FL2 transmitted to all facilities within larger geographic area on       Patient informed that his/her managed care company has contracts with or will negotiate with certain facilities, including the following:        Yes   Patient/family informed of bed offers received.  Patient chooses bed at       Physician recommends and patient chooses bed at      Patient to be transferred to   on  .  Patient to be transferred to facility by       Patient family notified on   of transfer.  Name of family member notified:        PHYSICIAN Please sign FL2     Additional Comment:    _______________________________________________ Dulcy Fanny, LCSW 12/11/2015, 2:21  PM

## 2015-12-11 NOTE — Progress Notes (Signed)
OT Cancellation Note  Patient Details Name: [name] MRN: [id number] DOB: [birth year]   Cancelled Treatment:    Reason Eval/Treat Not Completed: Other (comment). Pt politely declined OT this afternoon on second attempt. Pt states that she has worked with PT today and now she is comfortable, has diarrhea and will be d/c to a SNF today for continued therapy  Britt Bottom 12/11/2015, 1:43 PM

## 2015-12-11 NOTE — Progress Notes (Signed)
OT Cancellation Note  Patient Details Name: [name] MRN: [id number] DOB: [birth year]   Cancelled Treatment:    Reason Eval/Treat Not Completed: Fatigue/lethargy limiting ability to participate. Pt just got back into bed after ADLs and toileting with nurse tech and requested a rest break and for OT to return later  Britt Bottom 12/11/2015, 10:29 AM

## 2015-12-11 NOTE — Progress Notes (Signed)
NT and CN told me that patient fell.As per nurse tech patient was holding on the opened door of the pick up truck by her right hand and placing her weight on her good left left leg.Patient was able to stood up for about five to eight seconds and her left leg buckled.Patient fell down on kneeling position,left leg flexed,left knee touched the ground and her right stump touched the ground too.Patient was on that position for about five seconds.Patient was able to get up herself using her right hand to push up herself while her father assisting her on her left side by lifting her left arm.Nurse tech observed no skin damage nor bleeding on the stump which is covered with black compression wrap.Patient refused to go to Emergency Department when the nurse offered to bring her.Nurse tech asked her twice, if she is in pain or hurt herself,Patient said 'no'.Patient able to got herself into the pick-up truck with the help of her father and the nurse tech.M.D. Made aware.

## 2015-12-11 NOTE — Progress Notes (Signed)
3 Days Post-Op  Subjective: No complaints. Tolerating diet  Objective: Vital signs in last 24 hours: Temp:  [98.4 F (36.9 C)-99.3 F (37.4 C)] 98.4 F (36.9 C) (06/26 0754) Pulse Rate:  [89-94] 90 (06/26 0754) Resp:  [17-18] 17 (06/26 0754) BP: (137-156)/(49-59) 156/59 mmHg (06/26 0754) SpO2:  [89 %-100 %] 93 % (06/26 0754) Weight:  [61.871 kg (136 lb 6.4 oz)] 61.871 kg (136 lb 6.4 oz) (06/25 2101) Last BM Date: 12/09/15  Intake/Output from previous day: 06/25 0701 - 06/26 0700 In: 360 [P.O.:360] Out: 0  Intake/Output this shift:    Resp: clear to auscultation bilaterally Cardio: regular rate and rhythm GI: soft, nontender. incisions look good  Lab Results:   Recent Labs  12/09/15 0459 12/10/15 0531  WBC 3.9* 4.9  HGB 9.0* 8.1*  HCT 28.5* 25.9*  PLT 369 303   BMET  Recent Labs  12/09/15 0459 12/10/15 0531  NA 140 138  K 3.5 3.3*  CL 110 108  CO2 22 21*  GLUCOSE 72 50*  BUN 18 16  CREATININE 0.82 0.75  CALCIUM 8.2* 8.2*   PT/INR No results for input([name]): LABPROT, INR in the last 72 hours. ABG No results for input([name]): PHART, HCO3 in the last 72 hours.  Invalid input([name]): PCO2, PO2  Studies/Results: No results found.  Anti-infectives: Anti-infectives    Start     Dose/Rate Route Frequency Ordered Stop   12/08/15 1000  sulfamethoxazole-trimethoprim (BACTRIM,SEPTRA) 400-80 MG per tablet 1 tablet  Status:  Discontinued     1 tablet Oral Every M-W-F 12/06/15 2220 12/07/15 1335   12/06/15 2300  cefTRIAXone (ROCEPHIN) 1 g in dextrose 5 % 50 mL IVPB     1 g 100 mL/hr over 30 Minutes Intravenous Every 24 hours 12/06/15 2222        Assessment/Plan: [name]/p Procedure([name]): LAPAROSCOPIC CHOLECYSTECTOMY WITH INTRAOPERATIVE CHOLANGIOGRAM (N/A) Advance diet Discharge when medically stable  LOS: 5 days    [name],[name] 12/11/2015

## 2015-12-11 NOTE — Progress Notes (Signed)
Physical Therapy Treatment Patient Details Name: [name] MRN: [id number] DOB: 11/20/1970 Today's Date: 12/11/2015    History of Present Illness pt is a 45 y/o female with pmh of DM, CHF, HTN ESRD, PVD, neuropathy, multiple amputations of foot to attempt limb salvage with ultimate R BKA on 6/14. pt was d/c'd to home, but immediately had mental status changes and weakness with inability to mobilize like the day before in hospital, so pt readmitted 6/21 and found to have cholecystitis, s/p lap chole on 6/23. Meanwhile pt has been inactive in the bed.    PT Comments    Making slow progress, amb distance limited by general fatigue and diarrhea; We discussed her progress, her parents' concerns re: caring for her at home; She tells me that her parents are looking into her going to a SNF for post-acute rehab -- this is not unreasonable given her good rehab potential and slower progress at this time; she will also benefit from more PT (daily at SNF as opposed to typically 3x/week with HHPT); Still, I have not seen much documentation of going SNF in the medical record, so I left a message with Malachy Mood, CM  Follow Up Recommendations  SNF (see above)     Equipment Recommendations  None recommended by PT (I believe she is pretty well-equipped)    Recommendations for Other Services       Precautions / Restrictions Precautions Precautions: Fall Restrictions RLE Weight Bearing: Non weight bearing    Mobility  Bed Mobility Overal bed mobility: Needs Assistance Bed Mobility: Rolling;Sidelying to Sit Rolling: Min guard Sidelying to sit: Min guard       General bed mobility comments: Used rails but not needing assist  Transfers Overall transfer level: Needs assistance Equipment used: Rolling walker (2 wheeled) Transfers: Sit to/from [facility] Sit to Stand: Min guard Stand pivot transfers: Min guard       General transfer comment: Minguard with basic pivot bed to  [place] on pt's R side (began stooling during transfer); sit <> stand with minguard assist; cues for hand placement  Ambulation/Gait Ambulation/Gait assistance: Min guard Ambulation Distance (Feet): 5 Feet Assistive device: Rolling walker (2 wheeled) Gait Pattern/deviations:  (hop-to)     General Gait Details: Took a few steps with RW to recliner; exhausted after that activity    Financial trader Rankin (Stroke Patients Only)       Balance     Sitting balance-Leahy Scale: Good       Standing balance-Leahy Scale: Poor                      Cognition Arousal/Alertness: Awake/alert Behavior During Therapy: WFL for tasks assessed/performed Overall Cognitive Status: Within Functional Limits for tasks assessed                      Exercises      General Comments General comments (skin integrity, edema, etc.): REinforced the need to position with R knee straight      Pertinent Vitals/Pain Pain Assessment: No/denies pain    Home Living                      Prior Function            PT Goals (current goals can now be found in the care plan section) Acute Rehab PT Goals Patient Stated  Goal: mentioned to me today that her parents are looking into perhaps SNF for her to go to for rehab PT Goal Formulation: With patient Time For Goal Achievement: 12/16/15 Potential to Achieve Goals: Good Progress towards PT goals: Progressing toward goals (Slowly)    Frequency  Min 3X/week    PT Plan Discharge plan needs to be updated    Co-evaluation             End of Session Equipment Utilized During Treatment: Gait belt Activity Tolerance: Patient tolerated treatment well;Patient limited by fatigue Patient left: in chair;with call bell/phone within reach     Time: 1220-1237 PT Time Calculation (min) (ACUTE ONLY): 17 min  Charges:  $Therapeutic Activity: 8-22 mins                    G Codes:       Quin Hoop 12/11/2015, 1:51 PM  Roney Marion, [place] Pager [phone] Office [phone]

## 2015-12-12 ENCOUNTER — Telehealth: Payer: Self-pay | Admitting: Internal Medicine

## 2015-12-12 LAB — TACROLIMUS LEVEL: Tacrolimus (FK506) - LabCorp: 8.9 ng/mL (ref 2.0–20.0)

## 2015-12-12 NOTE — Discharge Summary (Signed)
[facility] Discharge Summary   Patient: [name] [id number]   PCP: Benito Mccreedy, MD DOB: 1971-04-21   Date of admission: 12/06/2015   Date of discharge: 12/11/2015    Discharge Diagnoses:  Principal Problem:   Acute blood loss anemia Active Problems:   Uncontrolled type 2 diabetes mellitus with peripheral artery disease (HCC)   Acute respiratory failure with hypoxia (HCC)   Protein-calorie malnutrition, severe (HCC)   Essential hypertension   Anemia of chronic disease   Below knee amputation status ([place])   Transaminitis   Acute on chronic renal failure ([place])   History of renal transplant   UTI (lower urinary tract infection)   Acute cholecystitis  Admitted From: Home Disposition:  Home with home health  Recommendations for Outpatient Follow-up:  1. Please follow-up with PCP in one week. 2. Please follow-up with nephrology as needed. 3. Please follow-up with Dr. Sharol Given in 1 week for wound check. 4. Please follow-up with surgery as recommended   Follow-up Information    Follow up with DUDA,MARCUS V, MD In 1 week.   Specialty:  Orthopedic Surgery   Why:  APPOINTMENT: WEDNESDAY, 12-20-15 @  2:15 PM   Contact information:   [place] [address] [phone]       Follow up with [facility], MD. Schedule an appointment as soon as possible for a visit in 3 weeks.   Specialty:  General Surgery   Why:  APPOINTMENT: MONDAY, 01-08-16 @ 2:45 PM, ARRIVE BY 2:15 PM TO FILL OUT NEW PATIENT PAPERWORK    Contact information:   [address] [place] [address] [phone]       Follow up with [name],GEORGE, MD. Schedule an appointment as soon as possible for a visit in 1 week.   Specialty:  Internal Medicine   Why:  APPOINTMENT: WEDNESDAY, 12-20-15 @ 11:15 AM IN [place] OFFICE   Contact information:   [id number] [place] [address] [phone]       Call Ulla Potash., MD.   Specialty:  Nephrology   Why:  As  needed   Contact information:   [address] [place] [address] [phone]      Diet recommendation: Carb modified diet  Activity: The patient is advised to gradually reintroduce usual activities.  Discharge Condition: good  Code Status: Full code  History of present illness: As per the H and P dictated on admission, "[name] is a 45 y.o. female with a past medical history significant for ESRD s/p renal transplant, IDDM, PVD s/p recent BKA for gangrene, and anemia of CKD who presents with weakness.  The patient was admitted 6/14-6/20 for amputation of gangrene of the right hindfoot and underwent RIGHT BKA on 6/14. The hospital stay was uncomplicated (creatinine 0.9 preoperatively, not checked afterwards), she had some hypoxia during that hospitalization with a low-probability V-Q scan, and was discharged without oxygen yesterday to home.  Today, she was at home, but was becoming more weak and requiring full assist for walking, transfers, feeding and dressing. She describes to me being successful with PT and OT in the hospital, but getting home yesterday and suddenly feeling "like everything left my brain," forgetful and tired. She had emesis and family found she had bowel incontinence in bed several times, so her family brought her back to the ER. She had no fever, chills, cough, dysuria, urinary urgency, drainage from her wound."  Hospital Course:  Summary of her active problems in the hospital is  as following.  Acute Cholecystitis: RUQ U/s shows gallbladder thickening with sludge. No dilatation of the bile ducts LFTs are elevated, Total bilirubin 1.7, PT 18.2, INR 1.5 Placed on IV ceftriaxone and IVFs, finished 5 days of antibiotic. General surgery preformed cholecystectomy 12/08/2015.  Tolerating soft diet. Ambulate and use incentive spirometry.  Acute blood loss anemia on chronic anemia of CKD:  Most likely Secondary to recent BKA on 6/14  Transfused total of 1  unit of PRBC this hospitilization Continue to monitor with CBC, H&H today dropped the ground. Continue home iron supplementation  Acute kidney injury, resolved Baseline Cr 1.0  Kidney transplant in 2014, on Prograft, mycophenolate and Bactrim. Will hold medications for possible toxicity and in the setting on ongoing infection. Prograft level within normal limits. 8.9 Transplant surgery at [facility] recommended pt can continue to have her nephrology care here. Consulted Nephrology, appreciate input, with significant improvement in renal function IV fluids stopped.  Acute Hypoxia 93% spo2 on ED presentation. Placed on 2 L O2 nasal cannula. Not on O2 at baseline. 95% spo2 today on 2 L. BNP is elevated but currently has AKI, does not appear clinically volume overloaded. ECHO on 10/14/15 shows EF of 65-70% D-dimer is 3.78, VQ scan negative. Echocardiogram pending for cardiomegaly.  UTI: Pyuria and bacteriuria with recent hospitalization. immunosupressed  Urine culture insignificant quantities of gram negative rods. Treated with ceftriaxone for 5 days.  IDDM: continue sliding scale corrections. On Levemir 14 and 22 units at home. Reducing Levemir 6 units daily due to adequate glucose control Continue to monitor CBG  Peripheral vascular disease/ HTN: Continue amlodipine, clonidine, labetalol, aspirin PRN hydralazine.  GERD Continue PPI  Severe protein calorie malnutrition:  Ensure daily  Weakness and altered mental status.  Likely secondary to cholecystitis or UTI.  Resolved   Recent BKA right. May continue Robaxin and Percocet PRN Duda's note states "incision is clean minimal drainage, continue prosthetic shrinker, f/u in my office in 1 week" Will continue to monitor during hospital stay.  Diarrhea  due to stool softener, resolved  All other chronic medical condition were stable during the hospitalization.  Patient was seen by physical therapy, who recommended home  health, which was arranged by Education officer, museum and case Freight forwarder. On the day of the discharge the patient's vitals are stable, and no other acute medical condition were reported by patient. the patient was felt safe to be discharge at home with home health.  Procedures and Results:   Laparoscopic cholecystectomy.  Consultations:  Nephrology, general surgery  DISCHARGE MEDICATION: Discharge Medication List as of 12/11/2015  3:34 PM    START taking these medications   Details  feeding supplement, ENSURE ENLIVE, (ENSURE ENLIVE) LIQD Take 237 mLs by mouth 2 (two) times daily between meals., Starting 12/11/2015, Until Discontinued, Normal    lactobacillus acidophilus (BACID) TABS tablet Take 2 tablets by mouth 3 (three) times daily., Starting 12/11/2015, Until Discontinued, Normal      CONTINUE these medications which have CHANGED   Details  insulin detemir (LEVEMIR) 100 UNIT/ML injection Inject 0.06 mLs (6 Units total) into the skin at bedtime., Starting 12/11/2015, Until Discontinued, Normal    polyethylene glycol (MIRALAX / GLYCOLAX) packet Take 17 g by mouth daily as needed., Starting 12/11/2015, Until Discontinued, Normal      CONTINUE these medications which have NOT CHANGED   Details  acetaminophen (TYLENOL) 325 MG tablet Take 2 tablets (650 mg total) by mouth every 6 (six) hours as needed for mild pain (or Fever >/=  101)., Starting 10/18/2015, Until Discontinued, Normal    albuterol (PROVENTIL HFA;VENTOLIN HFA) 108 (90 BASE) MCG/ACT inhaler Inhale 1 puff into the lungs every 6 (six) hours as needed for wheezing or shortness of breath., Until Discontinued, Historical Med    amLODipine (NORVASC) 10 MG tablet Take 10 mg by mouth at bedtime. , Starting 11/14/2011, Until Discontinued, Historical Med    aspirin 81 MG EC tablet Take 1 tablet (81 mg total) by mouth daily., Starting 10/11/2015, Until Discontinued, OTC    cilostazol (PLETAL) 100 MG tablet Take 100 mg by mouth 2 (two) times daily.,  Until Discontinued, Historical Med    cloNIDine (CATAPRES - DOSED IN MG/24 HR) 0.2 mg/24hr patch Place 0.2 mg onto the skin every Friday., Until Discontinued, Historical Med    ferrous sulfate 325 (65 FE) MG tablet Take 1 tablet (325 mg total) by mouth 3 (three) times daily with meals., Starting 10/11/2015, Until Discontinued, Normal    insulin aspart (NOVOLOG) 100 UNIT/ML injection Inject 4-14 Units into the skin 3 (three) times daily before meals. Per sliding scale: 100-150, 4 units. 151-200, 6 units. 201-250, 8 units. 251-300, 10 units. 301-350, 12 units. Over 350, 14 units., Until Discontinued, Historical Med    labetalol (NORMODYNE) 300 MG tablet Take 300 mg by mouth 2 (two) times daily., Starting 11/06/2015, Until Discontinued, Historical Med    methocarbamol (ROBAXIN) 500 MG tablet Take 1 tablet (500 mg total) by mouth every 8 (eight) hours as needed for muscle spasms., Starting 12/05/2015, Until Discontinued, Print    mycophenolate (MYFORTIC) 180 MG EC tablet Take 360-540 mg by mouth 2 (two) times daily. Take three capsules (540mg ) in the morning and two capsules (360mg ) at night., Until Discontinued, Historical Med    nitroGLYCERIN (NITRODUR - DOSED IN MG/24 HR) 0.2 mg/hr patch Place 1 patch (0.2 mg total) onto the skin daily., Starting 10/09/2015, Until Discontinued, Normal    !! oxyCODONE-acetaminophen (ROXICET) 5-325 MG tablet Take 1 tablet by mouth every 4 (four) hours as needed for severe pain., Starting 12/01/2015, Until Discontinued, Print    pantoprazole (PROTONIX) 40 MG tablet Take 40 mg by mouth at bedtime. , Until Discontinued, Historical Med    pravastatin (PRAVACHOL) 20 MG tablet Take 20 mg by mouth at bedtime. , Starting 09/11/2012, Until Discontinued, Historical Med    senna-docusate (SENOKOT-S) 8.6-50 MG tablet Take 1 tablet by mouth 2 (two) times daily., Starting 10/18/2015, Until Discontinued, Normal    sulfamethoxazole-trimethoprim (BACTRIM,SEPTRA) 400-80 MG per tablet  Take 1 tablet by mouth every Monday, Wednesday, and Friday. , Until Discontinued, Historical Med    !! tacrolimus (PROGRAF) 0.5 MG capsule Take 0.5 mg by mouth 2 (two) times daily. Take with 1mg  capsule to make a total of 1.5mg ., Until Discontinued, Historical Med    !! tacrolimus (PROGRAF) 1 MG capsule Take 1 mg by mouth 2 (two) times daily. Take with 0.5mg  capsule to make a total of 1.5mg ., Until Discontinued, Historical Med    Vitamin D, Ergocalciferol, (DRISDOL) 50000 units CAPS capsule Take 50,000 Units by mouth every Sunday. , Until Discontinued, Historical Med    !! oxyCODONE-acetaminophen (ROXICET) 5-325 MG tablet Take 1 tablet by mouth every 4 (four) hours as needed for severe pain., Starting 12/05/2015, Until Discontinued, Print     !! - Potential duplicate medications found. Please discuss with provider.    STOP taking these medications     enoxaparin (LOVENOX) 40 MG/0.4ML injection        No Known Allergies Discharge Instructions  Diet - low sodium heart healthy    Complete by:  As directed      Diet Carb Modified    Complete by:  As directed      Discharge instructions    Complete by:  As directed   It is important that you read following instructions as well as go over your medication list with RN to help you understand your care after this hospitalization.  Discharge Instructions: Please follow-up with PCP in one week  Please request your primary care physician to go over all Hospital Tests and Procedure/Radiological results at the follow up,  Please get all Hospital records sent to your PCP by signing hospital release before you go home.   Do not take more than prescribed Pain, Sleep and Anxiety Medications. You were cared for by a hospitalist during your hospital stay. If you have any questions about your discharge medications or the care you received while you were in the hospital after you are discharged, you can call the unit and ask to speak with the  hospitalist on call if the hospitalist that took care of you is not available.  Once you are discharged, your primary care physician will handle any further medical issues. Please note that NO REFILLS for any discharge medications will be authorized once you are discharged, as it is imperative that you return to your primary care physician (or establish a relationship with a primary care physician if you do not have one) for your aftercare needs so that they can reassess your need for medications and monitor your lab values. You Must read complete instructions/literature along with all the possible adverse reactions/side effects for all the Medicines you take and that have been prescribed to you. Take any new Medicines after you have completely understood and accept all the possible adverse reactions/side effects. Wear Seat belts while driving. If you have smoked or chewed Tobacco in the last 2 yrs please stop smoking and/or stop any Recreational drug use.     Increase activity slowly    Complete by:  As directed           Discharge Exam: Filed Weights   12/08/15 2014 12/09/15 2041 12/10/15 2101  Weight: 58.605 kg (129 lb 3.2 oz) 61.462 kg (135 lb 8 oz) 61.871 kg (136 lb 6.4 oz)   Filed Vitals:   12/11/15 0431 12/11/15 0754  BP: 154/54 156/59  Pulse: 90 90  Temp: 98.7 F (37.1 C) 98.4 F (36.9 C)  Resp: 18 17   General: Appear in no distress, no Rash; Oral Mucosa moist. Cardiovascular: S1 and S2 Present, no Murmur, no JVD Respiratory: Bilateral Air entry present and Clear to Auscultation, no Crackles, no wheezes Abdomen: Bowel Sound present, Soft and no tenderness Extremities: no Pedal edema, no calf tenderness, right BKA Neurology: Grossly no focal neuro deficit.  The results of significant diagnostics from this hospitalization (including imaging, microbiology, ancillary and laboratory) are listed below for reference.    Significant Diagnostic Studies: Dg Chest 2  View  12/06/2015  CLINICAL DATA:  Fatigue, weakness, poor appetite. Decreased O2 sats. EXAM: CHEST  2 VIEW COMPARISON:  10/14/2015 FINDINGS: Cardiomegaly. No overt edema. Lingular and right basilar atelectasis. No effusions. No acute bony abnormality. IMPRESSION: Bibasilar atelectasis.  Cardiomegaly. Electronically Signed   By: Rolm Baptise M.D.   On: 12/06/2015 19:40   Dg Cholangiogram Operative  12/08/2015  CLINICAL DATA:  Gallstone disease EXAM: INTRAOPERATIVE CHOLANGIOGRAM TECHNIQUE: Cholangiographic images from the C-arm fluoroscopic device were submitted  for interpretation post-operatively. Please see the procedural report for the amount of contrast and the fluoroscopy time utilized. COMPARISON:  None. FINDINGS: Contrast fills the biliary tree and duodenum without filling defects in the common bile duct. IMPRESSION: Patent biliary tree. Electronically Signed   By: Marybelle Killings M.D.   On: 12/08/2015 10:48   Ct Head Wo Contrast  12/06/2015  CLINICAL DATA:  Per [facility], pt from home. Pt had R leg BKA on the 14th, released yesterday. Everything has been good, when she got home, c/o generalized weakness, altered, abnormal behavior, incontinence per family. o2 sats room air 87% per fire, pt placed on [place], Sats 97%. VSS. HR 106, BP 130/60. Pt is AAOX4. Denies pain medicine. Pt weak, poor appetite. Pt taken prescribed pain medicine. EXAM: CT HEAD WITHOUT CONTRAST TECHNIQUE: Contiguous axial images were obtained from the base of the skull through the vertex without intravenous contrast. COMPARISON:  03/29/2011 FINDINGS: The ventricles are normal in size and configuration. There are no parenchymal masses or mass effect, no evidence of an infarct, no extra-axial masses or abnormal fluid collections and no intracranial hemorrhage. There is a small amount of dependent fluid in the left sphenoid sinus. There is mild sinus mucosal thickening involving all sinuses. The mastoid air cells are mostly opacified bilaterally.  No skull lesion. IMPRESSION: 1. No intracranial abnormality. 2. Sinus mucosal thickening, mild. Bilateral mastoid air cell opacification, likely effusions. Electronically Signed   By: Lajean Manes M.D.   On: 12/06/2015 19:21   US Abdomen Complete  12/06/2015  CLINICAL DATA:  Acute onset of elevated LFTs. Generalized abdominal pain. Initial encounter. EXAM: ABDOMEN ULTRASOUND COMPLETE COMPARISON:  CT of the abdomen and pelvis from 04/02/2015, and renal ultrasound performed 03/31/2011 FINDINGS: Gallbladder: The gallbladder is filled with small stones and sludge, with significant surrounding pericholecystic fluid and a positive ultrasonographic Murphy's sign. Gallbladder wall thickening is noted. Findings are concerning for acute cholecystitis. Common bile duct: Diameter: 0.6 cm, within normal limits in caliber. Liver: No focal lesion identified. Within normal limits in parenchymal echogenicity. IVC: No abnormality visualized. Pancreas: Visualized portion unremarkable. Spleen: Size and appearance within normal limits. Right Kidney: Length: 7.1 cm. Diffusely increased parenchymal echogenicity. Moderate right renal atrophy is noted. No mass or hydronephrosis visualized. Left Kidney: Length: 7.0 cm. Diffusely increased parenchymal echogenicity. Moderate left renal atrophy is noted. No mass or hydronephrosis visualized. Abdominal aorta: No aneurysm visualized. Other findings: The patient's right iliac fossa transplant kidney is unremarkable in appearance, measuring 12.0 cm in length, with a single small 1.1 cm cyst. IMPRESSION: 1. Stones and sludge filling the gallbladder, with significant surrounding pericholecystic fluid, gallbladder wall thickening and a positive ultrasonographic Murphy's sign. Findings are concerning for acute cholecystitis. 2. Right iliac fossa transplant kidney is unremarkable in appearance, with a small renal cyst. Moderate atrophy of the native kidneys. Electronically Signed   By: Garald Balding M.D.   On: 12/06/2015 23:32   Nm Pulmonary Perf And Vent  12/07/2015  CLINICAL DATA:  Elevated D-dimer, discharge from hospital yesterday following RIGHT BKA 11/29/2015 EXAM: NUCLEAR MEDICINE VENTILATION - PERFUSION LUNG SCAN TECHNIQUE: Ventilation images were obtained in multiple projections using inhaled aerosol Tc-52m DTPA. Perfusion images were obtained in multiple projections after intravenous injection of Tc-55m MAA. RADIOPHARMACEUTICALS:  32.2 mCi Technetium-49m DTPA aerosol inhalation and 4.2 mCi Technetium-30m MAA IV COMPARISON:  10/14/2015 Radiographic correlation:  Chest radiograph 12/06/2015 FINDINGS: Ventilation: Enlargement of cardiac silhouette. Diminished ventilation RIGHT lower lobe. Ventilation otherwise normal. Perfusion: Enlargement of cardiac silhouette. Perfusion  otherwise normal. Chest radiograph: Enlargement of cardiac silhouette with bibasilar atelectasis. IMPRESSION: Enlargement of cardiac silhouette. Otherwise normal perfusion lung scan. Diminished ventilation RIGHT lower lobe. Electronically Signed   By: Lavonia Dana M.D.   On: 12/07/2015 17:33    Microbiology: Recent Results (from the past 240 hour(s))  Urine culture     Status: Abnormal   Collection Time: 12/06/15  7:08 PM  Result Value Ref Range Status   Specimen Description URINE, RANDOM  Final   Special Requests NONE  Final   Culture 40,000 COLONIES/mL ESCHERICHIA COLI (A)  Final   Report Status 12/09/2015 FINAL  Final   Organism ID, Bacteria ESCHERICHIA COLI (A)  Final      Susceptibility   Escherichia coli - MIC*    AMPICILLIN >=32 RESISTANT Resistant     CEFAZOLIN <=4 SENSITIVE Sensitive     CEFTRIAXONE <=1 SENSITIVE Sensitive     CIPROFLOXACIN 0.5 SENSITIVE Sensitive     GENTAMICIN >=16 RESISTANT Resistant     IMIPENEM <=0.25 SENSITIVE Sensitive     NITROFURANTOIN <=16 SENSITIVE Sensitive     TRIMETH/SULFA >=320 RESISTANT Resistant     AMPICILLIN/SULBACTAM >=32 RESISTANT Resistant     PIP/TAZO <=4  SENSITIVE Sensitive     * 40,000 COLONIES/mL ESCHERICHIA COLI  Surgical pcr screen     Status: None   Collection Time: 12/07/15 11:00 PM  Result Value Ref Range Status   MRSA, PCR NEGATIVE NEGATIVE Final   Staphylococcus aureus NEGATIVE NEGATIVE Final    Comment:        The Xpert SA Assay (FDA approved for NASAL specimens in patients over 1 years of age), is one component of a comprehensive surveillance program.  Test performance has been validated by [facility] for patients greater than or equal to 45 year old. It is not intended to diagnose infection nor to guide or monitor treatment.      Labs: CBC:  Recent Labs Lab 12/06/15 1838 12/07/15 0205 12/08/15 0406 12/09/15 0459 12/10/15 0531 12/11/15 0834  WBC 4.0 4.3 4.1 3.9* 4.9 4.3  NEUTROABS 2.5  --  2.0 2.2  --   --   HGB 6.8* 8.3* 8.8* 9.0* 8.1* 8.5*  HCT 22.5* 26.0* 27.4* 28.5* 25.9* 27.5*  MCV 75.3* 75.4* 76.8* 77.0* 76.9* 77.7*  PLT 353 311 353 369 [id number]   Basic Metabolic Panel:  Recent Labs Lab 12/07/15 0205 12/08/15 0406 12/09/15 0459 12/10/15 0531 12/11/15 0834  NA 133* 137 140 138 137  K 4.0 3.1* 3.5 3.3* 3.3*  CL 98* 108 110 108 107  CO2 24 23 22  21* 23  GLUCOSE 141* 79 72 50* 148*  BUN 44* 29* 18 16 11   CREATININE 1.60* 0.95 0.82 0.75 0.78  CALCIUM 8.6* 8.5* 8.2* 8.2* 8.1*  MG  --  2.5* 2.2  --  1.9  PHOS  --  3.6  --   --   --    Liver Function Tests:  Recent Labs Lab 12/06/15 1838 12/07/15 0205 12/08/15 0406 12/09/15 0459 12/10/15 0531  AST 88* 62* 29 56* 35  ALT 113* 98* 62* 39 28  ALKPHOS 793* 754* 751* 593* 612*  BILITOT 1.1 1.7* 0.4 0.5 0.5  PROT 5.6* 5.7* 5.5* 5.2* 4.8*  ALBUMIN 2.2* 2.3* 2.1* 1.8* 1.8*    Recent Labs Lab 12/07/15 1818  LIPASE 12    Recent Labs Lab 12/06/15 1838  AMMONIA 21   Cardiac Enzymes:  Recent Labs Lab 12/07/15 1818  CKTOTAL 40   BNP (last  3 results)  Recent Labs  12/06/15 1838  BNP 221.9*   CBG:  Recent Labs Lab  12/10/15 1220 12/10/15 1629 12/10/15 2107 12/11/15 0757 12/11/15 1210  GLUCAP 101* 169* 126* 149* 146*   Time spent: 30 minutes  Signed:  Jaylea [name]  [facility] 12/11/2015 , 7:13 AM

## 2015-12-12 NOTE — Telephone Encounter (Signed)
[facility] TELEPHONE ENCOUNTER NOTE  Patient: [name] [id number]   PCP: Benito Mccreedy, MD DOB: [birth year]     DOS: 12/11/2015    Subjective: Notified by RN that the patient had buckled her leg while getting in the car after discharge. I called patient's mother mentions that patient is at home, and at no distress. I consulted social worker in the ER who mentioned that she will follow-up on the recommendation of physical therapy for SNF with family and send out some referral for facilities. Notified this patient's mother and recommended that if they have any acute complaint at home to visit ER again.  Assessment and plan: Social worker to follow-up on placement recommendation.  Author: Berle Mull, MD [facility] Pager: [phone] 12/11/2015 7:24 PM   If 7PM-7AM, please contact night-coverage at [email or web address], password [facility]

## 2015-12-13 DIAGNOSIS — H6502 Acute serous otitis media, left ear: Secondary | ICD-10-CM | POA: Diagnosis not present

## 2015-12-13 DIAGNOSIS — G9009 Other idiopathic peripheral autonomic neuropathy: Secondary | ICD-10-CM | POA: Diagnosis not present

## 2015-12-13 DIAGNOSIS — I739 Peripheral vascular disease, unspecified: Secondary | ICD-10-CM | POA: Diagnosis not present

## 2015-12-13 DIAGNOSIS — Y841 Kidney dialysis as the cause of abnormal reaction of the patient, or of later complication, without mention of misadventure at the time of the procedure: Secondary | ICD-10-CM | POA: Diagnosis not present

## 2015-12-13 DIAGNOSIS — M7989 Other specified soft tissue disorders: Secondary | ICD-10-CM | POA: Diagnosis not present

## 2015-12-13 DIAGNOSIS — I10 Essential (primary) hypertension: Secondary | ICD-10-CM | POA: Diagnosis not present

## 2015-12-13 DIAGNOSIS — M6281 Muscle weakness (generalized): Secondary | ICD-10-CM | POA: Diagnosis not present

## 2015-12-13 DIAGNOSIS — E119 Type 2 diabetes mellitus without complications: Secondary | ICD-10-CM | POA: Diagnosis not present

## 2015-12-15 LAB — ECHOCARDIOGRAM LIMITED
E decel time: 193 msec
FS: 51 % — AB (ref 28–44)
IVS/LV PW RATIO, ED: 1.02
LA ID, A-P, ES: 29 mm
LA diam end sys: 29 mm
LA diam index: 1.86 cm/m2
LV PW d: 8.18 mm — AB (ref 0.6–1.1)
LV dias vol index: 39 mL/m2
LV dias vol: 61 mL (ref 46–106)
LV sys vol index: 11 mL/m2
LV sys vol: 17 mL (ref 14–42)
LVOT area: 2.84 cm2
LVOT diameter: 19 mm
MV Dec: 193
MV Peak grad: 4 mmHg
MV pk A vel: 101 m/s
MV pk E vel: 97.4 m/s
Simpson's disk: 72
Stroke v: 44 ml

## 2015-12-22 DIAGNOSIS — H6502 Acute serous otitis media, left ear: Secondary | ICD-10-CM | POA: Diagnosis not present

## 2015-12-26 DIAGNOSIS — Y841 Kidney dialysis as the cause of abnormal reaction of the patient, or of later complication, without mention of misadventure at the time of the procedure: Secondary | ICD-10-CM | POA: Diagnosis not present

## 2015-12-26 DIAGNOSIS — M7989 Other specified soft tissue disorders: Secondary | ICD-10-CM | POA: Diagnosis not present

## 2015-12-26 DIAGNOSIS — E119 Type 2 diabetes mellitus without complications: Secondary | ICD-10-CM | POA: Diagnosis not present

## 2015-12-26 DIAGNOSIS — I739 Peripheral vascular disease, unspecified: Secondary | ICD-10-CM | POA: Diagnosis not present

## 2016-01-01 DIAGNOSIS — E1151 Type 2 diabetes mellitus with diabetic peripheral angiopathy without gangrene: Secondary | ICD-10-CM | POA: Diagnosis not present

## 2016-01-01 DIAGNOSIS — Z792 Long term (current) use of antibiotics: Secondary | ICD-10-CM | POA: Diagnosis not present

## 2016-01-01 DIAGNOSIS — D631 Anemia in chronic kidney disease: Secondary | ICD-10-CM | POA: Diagnosis not present

## 2016-01-01 DIAGNOSIS — E43 Unspecified severe protein-calorie malnutrition: Secondary | ICD-10-CM | POA: Diagnosis not present

## 2016-01-01 DIAGNOSIS — N186 End stage renal disease: Secondary | ICD-10-CM | POA: Diagnosis not present

## 2016-01-01 DIAGNOSIS — E1122 Type 2 diabetes mellitus with diabetic chronic kidney disease: Secondary | ICD-10-CM | POA: Diagnosis not present

## 2016-01-01 DIAGNOSIS — N184 Chronic kidney disease, stage 4 (severe): Secondary | ICD-10-CM | POA: Diagnosis not present

## 2016-01-01 DIAGNOSIS — J9690 Respiratory failure, unspecified, unspecified whether with hypoxia or hypercapnia: Secondary | ICD-10-CM | POA: Diagnosis not present

## 2016-01-01 DIAGNOSIS — Z87440 Personal history of urinary (tract) infections: Secondary | ICD-10-CM | POA: Diagnosis not present

## 2016-01-01 DIAGNOSIS — Z940 Kidney transplant status: Secondary | ICD-10-CM | POA: Diagnosis not present

## 2016-01-01 DIAGNOSIS — J1520 Pneumonia due to staphylococcus, unspecified: Secondary | ICD-10-CM | POA: Diagnosis not present

## 2016-01-01 DIAGNOSIS — Z7982 Long term (current) use of aspirin: Secondary | ICD-10-CM | POA: Diagnosis not present

## 2016-01-01 DIAGNOSIS — E1142 Type 2 diabetes mellitus with diabetic polyneuropathy: Secondary | ICD-10-CM | POA: Diagnosis not present

## 2016-01-01 DIAGNOSIS — I129 Hypertensive chronic kidney disease with stage 1 through stage 4 chronic kidney disease, or unspecified chronic kidney disease: Secondary | ICD-10-CM | POA: Diagnosis not present

## 2016-01-01 DIAGNOSIS — Z4781 Encounter for orthopedic aftercare following surgical amputation: Secondary | ICD-10-CM | POA: Diagnosis not present

## 2016-01-01 DIAGNOSIS — Z89511 Acquired absence of right leg below knee: Secondary | ICD-10-CM | POA: Diagnosis not present

## 2016-01-01 DIAGNOSIS — Z794 Long term (current) use of insulin: Secondary | ICD-10-CM | POA: Diagnosis not present

## 2016-01-01 DIAGNOSIS — Z48815 Encounter for surgical aftercare following surgery on the digestive system: Secondary | ICD-10-CM | POA: Diagnosis not present

## 2016-01-02 DIAGNOSIS — E1122 Type 2 diabetes mellitus with diabetic chronic kidney disease: Secondary | ICD-10-CM | POA: Diagnosis not present

## 2016-01-02 DIAGNOSIS — E43 Unspecified severe protein-calorie malnutrition: Secondary | ICD-10-CM | POA: Diagnosis not present

## 2016-01-02 DIAGNOSIS — Z87440 Personal history of urinary (tract) infections: Secondary | ICD-10-CM | POA: Diagnosis not present

## 2016-01-02 DIAGNOSIS — Z792 Long term (current) use of antibiotics: Secondary | ICD-10-CM | POA: Diagnosis not present

## 2016-01-02 DIAGNOSIS — E1151 Type 2 diabetes mellitus with diabetic peripheral angiopathy without gangrene: Secondary | ICD-10-CM | POA: Diagnosis not present

## 2016-01-02 DIAGNOSIS — Z4781 Encounter for orthopedic aftercare following surgical amputation: Secondary | ICD-10-CM | POA: Diagnosis not present

## 2016-01-02 DIAGNOSIS — Z940 Kidney transplant status: Secondary | ICD-10-CM | POA: Diagnosis not present

## 2016-01-02 DIAGNOSIS — D631 Anemia in chronic kidney disease: Secondary | ICD-10-CM | POA: Diagnosis not present

## 2016-01-02 DIAGNOSIS — E1142 Type 2 diabetes mellitus with diabetic polyneuropathy: Secondary | ICD-10-CM | POA: Diagnosis not present

## 2016-01-02 DIAGNOSIS — Z7982 Long term (current) use of aspirin: Secondary | ICD-10-CM | POA: Diagnosis not present

## 2016-01-02 DIAGNOSIS — Z48815 Encounter for surgical aftercare following surgery on the digestive system: Secondary | ICD-10-CM | POA: Diagnosis not present

## 2016-01-02 DIAGNOSIS — N184 Chronic kidney disease, stage 4 (severe): Secondary | ICD-10-CM | POA: Diagnosis not present

## 2016-01-02 DIAGNOSIS — I129 Hypertensive chronic kidney disease with stage 1 through stage 4 chronic kidney disease, or unspecified chronic kidney disease: Secondary | ICD-10-CM | POA: Diagnosis not present

## 2016-01-02 DIAGNOSIS — Z794 Long term (current) use of insulin: Secondary | ICD-10-CM | POA: Diagnosis not present

## 2016-01-02 DIAGNOSIS — Z89511 Acquired absence of right leg below knee: Secondary | ICD-10-CM | POA: Diagnosis not present

## 2016-01-03 DIAGNOSIS — Z4781 Encounter for orthopedic aftercare following surgical amputation: Secondary | ICD-10-CM | POA: Diagnosis not present

## 2016-01-03 DIAGNOSIS — Z48815 Encounter for surgical aftercare following surgery on the digestive system: Secondary | ICD-10-CM | POA: Diagnosis not present

## 2016-01-03 DIAGNOSIS — Z794 Long term (current) use of insulin: Secondary | ICD-10-CM | POA: Diagnosis not present

## 2016-01-03 DIAGNOSIS — E43 Unspecified severe protein-calorie malnutrition: Secondary | ICD-10-CM | POA: Diagnosis not present

## 2016-01-03 DIAGNOSIS — E1122 Type 2 diabetes mellitus with diabetic chronic kidney disease: Secondary | ICD-10-CM | POA: Diagnosis not present

## 2016-01-03 DIAGNOSIS — Z89511 Acquired absence of right leg below knee: Secondary | ICD-10-CM | POA: Diagnosis not present

## 2016-01-03 DIAGNOSIS — E1142 Type 2 diabetes mellitus with diabetic polyneuropathy: Secondary | ICD-10-CM | POA: Diagnosis not present

## 2016-01-03 DIAGNOSIS — Z940 Kidney transplant status: Secondary | ICD-10-CM | POA: Diagnosis not present

## 2016-01-03 DIAGNOSIS — D631 Anemia in chronic kidney disease: Secondary | ICD-10-CM | POA: Diagnosis not present

## 2016-01-03 DIAGNOSIS — I129 Hypertensive chronic kidney disease with stage 1 through stage 4 chronic kidney disease, or unspecified chronic kidney disease: Secondary | ICD-10-CM | POA: Diagnosis not present

## 2016-01-03 DIAGNOSIS — Z7982 Long term (current) use of aspirin: Secondary | ICD-10-CM | POA: Diagnosis not present

## 2016-01-03 DIAGNOSIS — Z792 Long term (current) use of antibiotics: Secondary | ICD-10-CM | POA: Diagnosis not present

## 2016-01-03 DIAGNOSIS — E1151 Type 2 diabetes mellitus with diabetic peripheral angiopathy without gangrene: Secondary | ICD-10-CM | POA: Diagnosis not present

## 2016-01-03 DIAGNOSIS — N184 Chronic kidney disease, stage 4 (severe): Secondary | ICD-10-CM | POA: Diagnosis not present

## 2016-01-03 DIAGNOSIS — Z87440 Personal history of urinary (tract) infections: Secondary | ICD-10-CM | POA: Diagnosis not present

## 2016-01-04 DIAGNOSIS — Z792 Long term (current) use of antibiotics: Secondary | ICD-10-CM | POA: Diagnosis not present

## 2016-01-04 DIAGNOSIS — Z48815 Encounter for surgical aftercare following surgery on the digestive system: Secondary | ICD-10-CM | POA: Diagnosis not present

## 2016-01-04 DIAGNOSIS — Z89511 Acquired absence of right leg below knee: Secondary | ICD-10-CM | POA: Diagnosis not present

## 2016-01-04 DIAGNOSIS — N184 Chronic kidney disease, stage 4 (severe): Secondary | ICD-10-CM | POA: Diagnosis not present

## 2016-01-04 DIAGNOSIS — Z7982 Long term (current) use of aspirin: Secondary | ICD-10-CM | POA: Diagnosis not present

## 2016-01-04 DIAGNOSIS — Z940 Kidney transplant status: Secondary | ICD-10-CM | POA: Diagnosis not present

## 2016-01-04 DIAGNOSIS — E1142 Type 2 diabetes mellitus with diabetic polyneuropathy: Secondary | ICD-10-CM | POA: Diagnosis not present

## 2016-01-04 DIAGNOSIS — Z794 Long term (current) use of insulin: Secondary | ICD-10-CM | POA: Diagnosis not present

## 2016-01-04 DIAGNOSIS — D631 Anemia in chronic kidney disease: Secondary | ICD-10-CM | POA: Diagnosis not present

## 2016-01-04 DIAGNOSIS — E43 Unspecified severe protein-calorie malnutrition: Secondary | ICD-10-CM | POA: Diagnosis not present

## 2016-01-04 DIAGNOSIS — Z4781 Encounter for orthopedic aftercare following surgical amputation: Secondary | ICD-10-CM | POA: Diagnosis not present

## 2016-01-04 DIAGNOSIS — E1122 Type 2 diabetes mellitus with diabetic chronic kidney disease: Secondary | ICD-10-CM | POA: Diagnosis not present

## 2016-01-04 DIAGNOSIS — Z87440 Personal history of urinary (tract) infections: Secondary | ICD-10-CM | POA: Diagnosis not present

## 2016-01-04 DIAGNOSIS — I129 Hypertensive chronic kidney disease with stage 1 through stage 4 chronic kidney disease, or unspecified chronic kidney disease: Secondary | ICD-10-CM | POA: Diagnosis not present

## 2016-01-04 DIAGNOSIS — E1151 Type 2 diabetes mellitus with diabetic peripheral angiopathy without gangrene: Secondary | ICD-10-CM | POA: Diagnosis not present

## 2016-01-08 DIAGNOSIS — Z940 Kidney transplant status: Secondary | ICD-10-CM | POA: Diagnosis not present

## 2016-01-08 DIAGNOSIS — Z792 Long term (current) use of antibiotics: Secondary | ICD-10-CM | POA: Diagnosis not present

## 2016-01-08 DIAGNOSIS — Z794 Long term (current) use of insulin: Secondary | ICD-10-CM | POA: Diagnosis not present

## 2016-01-08 DIAGNOSIS — Z87440 Personal history of urinary (tract) infections: Secondary | ICD-10-CM | POA: Diagnosis not present

## 2016-01-08 DIAGNOSIS — E1151 Type 2 diabetes mellitus with diabetic peripheral angiopathy without gangrene: Secondary | ICD-10-CM | POA: Diagnosis not present

## 2016-01-08 DIAGNOSIS — D631 Anemia in chronic kidney disease: Secondary | ICD-10-CM | POA: Diagnosis not present

## 2016-01-08 DIAGNOSIS — E1122 Type 2 diabetes mellitus with diabetic chronic kidney disease: Secondary | ICD-10-CM | POA: Diagnosis not present

## 2016-01-08 DIAGNOSIS — E1142 Type 2 diabetes mellitus with diabetic polyneuropathy: Secondary | ICD-10-CM | POA: Diagnosis not present

## 2016-01-08 DIAGNOSIS — Z7982 Long term (current) use of aspirin: Secondary | ICD-10-CM | POA: Diagnosis not present

## 2016-01-08 DIAGNOSIS — Z48815 Encounter for surgical aftercare following surgery on the digestive system: Secondary | ICD-10-CM | POA: Diagnosis not present

## 2016-01-08 DIAGNOSIS — Z89511 Acquired absence of right leg below knee: Secondary | ICD-10-CM | POA: Diagnosis not present

## 2016-01-08 DIAGNOSIS — E43 Unspecified severe protein-calorie malnutrition: Secondary | ICD-10-CM | POA: Diagnosis not present

## 2016-01-08 DIAGNOSIS — I129 Hypertensive chronic kidney disease with stage 1 through stage 4 chronic kidney disease, or unspecified chronic kidney disease: Secondary | ICD-10-CM | POA: Diagnosis not present

## 2016-01-08 DIAGNOSIS — N184 Chronic kidney disease, stage 4 (severe): Secondary | ICD-10-CM | POA: Diagnosis not present

## 2016-01-08 DIAGNOSIS — Z4781 Encounter for orthopedic aftercare following surgical amputation: Secondary | ICD-10-CM | POA: Diagnosis not present

## 2016-01-09 DIAGNOSIS — I129 Hypertensive chronic kidney disease with stage 1 through stage 4 chronic kidney disease, or unspecified chronic kidney disease: Secondary | ICD-10-CM | POA: Diagnosis not present

## 2016-01-09 DIAGNOSIS — N189 Chronic kidney disease, unspecified: Secondary | ICD-10-CM | POA: Diagnosis not present

## 2016-01-09 DIAGNOSIS — D631 Anemia in chronic kidney disease: Secondary | ICD-10-CM | POA: Diagnosis not present

## 2016-01-09 DIAGNOSIS — Z940 Kidney transplant status: Secondary | ICD-10-CM | POA: Diagnosis not present

## 2016-01-09 DIAGNOSIS — N2581 Secondary hyperparathyroidism of renal origin: Secondary | ICD-10-CM | POA: Diagnosis not present

## 2016-01-09 DIAGNOSIS — N183 Chronic kidney disease, stage 3 (moderate): Secondary | ICD-10-CM | POA: Diagnosis not present

## 2016-01-10 DIAGNOSIS — Z7982 Long term (current) use of aspirin: Secondary | ICD-10-CM | POA: Diagnosis not present

## 2016-01-10 DIAGNOSIS — E1151 Type 2 diabetes mellitus with diabetic peripheral angiopathy without gangrene: Secondary | ICD-10-CM | POA: Diagnosis not present

## 2016-01-10 DIAGNOSIS — N184 Chronic kidney disease, stage 4 (severe): Secondary | ICD-10-CM | POA: Diagnosis not present

## 2016-01-10 DIAGNOSIS — Z48815 Encounter for surgical aftercare following surgery on the digestive system: Secondary | ICD-10-CM | POA: Diagnosis not present

## 2016-01-10 DIAGNOSIS — Z792 Long term (current) use of antibiotics: Secondary | ICD-10-CM | POA: Diagnosis not present

## 2016-01-10 DIAGNOSIS — E43 Unspecified severe protein-calorie malnutrition: Secondary | ICD-10-CM | POA: Diagnosis not present

## 2016-01-10 DIAGNOSIS — E1122 Type 2 diabetes mellitus with diabetic chronic kidney disease: Secondary | ICD-10-CM | POA: Diagnosis not present

## 2016-01-10 DIAGNOSIS — I129 Hypertensive chronic kidney disease with stage 1 through stage 4 chronic kidney disease, or unspecified chronic kidney disease: Secondary | ICD-10-CM | POA: Diagnosis not present

## 2016-01-10 DIAGNOSIS — Z940 Kidney transplant status: Secondary | ICD-10-CM | POA: Diagnosis not present

## 2016-01-10 DIAGNOSIS — Z4781 Encounter for orthopedic aftercare following surgical amputation: Secondary | ICD-10-CM | POA: Diagnosis not present

## 2016-01-10 DIAGNOSIS — Z87440 Personal history of urinary (tract) infections: Secondary | ICD-10-CM | POA: Diagnosis not present

## 2016-01-10 DIAGNOSIS — E1142 Type 2 diabetes mellitus with diabetic polyneuropathy: Secondary | ICD-10-CM | POA: Diagnosis not present

## 2016-01-10 DIAGNOSIS — D631 Anemia in chronic kidney disease: Secondary | ICD-10-CM | POA: Diagnosis not present

## 2016-01-10 DIAGNOSIS — Z794 Long term (current) use of insulin: Secondary | ICD-10-CM | POA: Diagnosis not present

## 2016-01-10 DIAGNOSIS — Z89511 Acquired absence of right leg below knee: Secondary | ICD-10-CM | POA: Diagnosis not present

## 2016-01-12 DIAGNOSIS — N184 Chronic kidney disease, stage 4 (severe): Secondary | ICD-10-CM | POA: Diagnosis not present

## 2016-01-12 DIAGNOSIS — E43 Unspecified severe protein-calorie malnutrition: Secondary | ICD-10-CM | POA: Diagnosis not present

## 2016-01-12 DIAGNOSIS — Z794 Long term (current) use of insulin: Secondary | ICD-10-CM | POA: Diagnosis not present

## 2016-01-12 DIAGNOSIS — E1151 Type 2 diabetes mellitus with diabetic peripheral angiopathy without gangrene: Secondary | ICD-10-CM | POA: Diagnosis not present

## 2016-01-12 DIAGNOSIS — Z87440 Personal history of urinary (tract) infections: Secondary | ICD-10-CM | POA: Diagnosis not present

## 2016-01-12 DIAGNOSIS — Z89511 Acquired absence of right leg below knee: Secondary | ICD-10-CM | POA: Diagnosis not present

## 2016-01-12 DIAGNOSIS — E1142 Type 2 diabetes mellitus with diabetic polyneuropathy: Secondary | ICD-10-CM | POA: Diagnosis not present

## 2016-01-12 DIAGNOSIS — Z940 Kidney transplant status: Secondary | ICD-10-CM | POA: Diagnosis not present

## 2016-01-12 DIAGNOSIS — D631 Anemia in chronic kidney disease: Secondary | ICD-10-CM | POA: Diagnosis not present

## 2016-01-12 DIAGNOSIS — E1122 Type 2 diabetes mellitus with diabetic chronic kidney disease: Secondary | ICD-10-CM | POA: Diagnosis not present

## 2016-01-12 DIAGNOSIS — I129 Hypertensive chronic kidney disease with stage 1 through stage 4 chronic kidney disease, or unspecified chronic kidney disease: Secondary | ICD-10-CM | POA: Diagnosis not present

## 2016-01-12 DIAGNOSIS — Z7982 Long term (current) use of aspirin: Secondary | ICD-10-CM | POA: Diagnosis not present

## 2016-01-12 DIAGNOSIS — Z48815 Encounter for surgical aftercare following surgery on the digestive system: Secondary | ICD-10-CM | POA: Diagnosis not present

## 2016-01-12 DIAGNOSIS — Z792 Long term (current) use of antibiotics: Secondary | ICD-10-CM | POA: Diagnosis not present

## 2016-01-12 DIAGNOSIS — Z4781 Encounter for orthopedic aftercare following surgical amputation: Secondary | ICD-10-CM | POA: Diagnosis not present

## 2016-01-15 DIAGNOSIS — Z89511 Acquired absence of right leg below knee: Secondary | ICD-10-CM | POA: Diagnosis not present

## 2016-01-15 DIAGNOSIS — Z4781 Encounter for orthopedic aftercare following surgical amputation: Secondary | ICD-10-CM | POA: Diagnosis not present

## 2016-01-15 DIAGNOSIS — Z7982 Long term (current) use of aspirin: Secondary | ICD-10-CM | POA: Diagnosis not present

## 2016-01-15 DIAGNOSIS — Z87440 Personal history of urinary (tract) infections: Secondary | ICD-10-CM | POA: Diagnosis not present

## 2016-01-15 DIAGNOSIS — I129 Hypertensive chronic kidney disease with stage 1 through stage 4 chronic kidney disease, or unspecified chronic kidney disease: Secondary | ICD-10-CM | POA: Diagnosis not present

## 2016-01-15 DIAGNOSIS — Z48815 Encounter for surgical aftercare following surgery on the digestive system: Secondary | ICD-10-CM | POA: Diagnosis not present

## 2016-01-15 DIAGNOSIS — D631 Anemia in chronic kidney disease: Secondary | ICD-10-CM | POA: Diagnosis not present

## 2016-01-15 DIAGNOSIS — E1151 Type 2 diabetes mellitus with diabetic peripheral angiopathy without gangrene: Secondary | ICD-10-CM | POA: Diagnosis not present

## 2016-01-15 DIAGNOSIS — Z794 Long term (current) use of insulin: Secondary | ICD-10-CM | POA: Diagnosis not present

## 2016-01-15 DIAGNOSIS — E43 Unspecified severe protein-calorie malnutrition: Secondary | ICD-10-CM | POA: Diagnosis not present

## 2016-01-15 DIAGNOSIS — N184 Chronic kidney disease, stage 4 (severe): Secondary | ICD-10-CM | POA: Diagnosis not present

## 2016-01-15 DIAGNOSIS — Z940 Kidney transplant status: Secondary | ICD-10-CM | POA: Diagnosis not present

## 2016-01-15 DIAGNOSIS — E1122 Type 2 diabetes mellitus with diabetic chronic kidney disease: Secondary | ICD-10-CM | POA: Diagnosis not present

## 2016-01-15 DIAGNOSIS — E1142 Type 2 diabetes mellitus with diabetic polyneuropathy: Secondary | ICD-10-CM | POA: Diagnosis not present

## 2016-01-15 DIAGNOSIS — Z792 Long term (current) use of antibiotics: Secondary | ICD-10-CM | POA: Diagnosis not present

## 2016-01-17 DIAGNOSIS — Z89511 Acquired absence of right leg below knee: Secondary | ICD-10-CM | POA: Diagnosis not present

## 2016-01-17 DIAGNOSIS — Z4781 Encounter for orthopedic aftercare following surgical amputation: Secondary | ICD-10-CM | POA: Diagnosis not present

## 2016-01-17 DIAGNOSIS — E1122 Type 2 diabetes mellitus with diabetic chronic kidney disease: Secondary | ICD-10-CM | POA: Diagnosis not present

## 2016-01-17 DIAGNOSIS — I129 Hypertensive chronic kidney disease with stage 1 through stage 4 chronic kidney disease, or unspecified chronic kidney disease: Secondary | ICD-10-CM | POA: Diagnosis not present

## 2016-01-17 DIAGNOSIS — Z87440 Personal history of urinary (tract) infections: Secondary | ICD-10-CM | POA: Diagnosis not present

## 2016-01-17 DIAGNOSIS — D631 Anemia in chronic kidney disease: Secondary | ICD-10-CM | POA: Diagnosis not present

## 2016-01-17 DIAGNOSIS — Z48815 Encounter for surgical aftercare following surgery on the digestive system: Secondary | ICD-10-CM | POA: Diagnosis not present

## 2016-01-17 DIAGNOSIS — E1142 Type 2 diabetes mellitus with diabetic polyneuropathy: Secondary | ICD-10-CM | POA: Diagnosis not present

## 2016-01-17 DIAGNOSIS — Z794 Long term (current) use of insulin: Secondary | ICD-10-CM | POA: Diagnosis not present

## 2016-01-17 DIAGNOSIS — E1151 Type 2 diabetes mellitus with diabetic peripheral angiopathy without gangrene: Secondary | ICD-10-CM | POA: Diagnosis not present

## 2016-01-17 DIAGNOSIS — Z7982 Long term (current) use of aspirin: Secondary | ICD-10-CM | POA: Diagnosis not present

## 2016-01-17 DIAGNOSIS — E43 Unspecified severe protein-calorie malnutrition: Secondary | ICD-10-CM | POA: Diagnosis not present

## 2016-01-17 DIAGNOSIS — N184 Chronic kidney disease, stage 4 (severe): Secondary | ICD-10-CM | POA: Diagnosis not present

## 2016-01-17 DIAGNOSIS — Z940 Kidney transplant status: Secondary | ICD-10-CM | POA: Diagnosis not present

## 2016-01-17 DIAGNOSIS — Z792 Long term (current) use of antibiotics: Secondary | ICD-10-CM | POA: Diagnosis not present

## 2016-01-18 ENCOUNTER — Other Ambulatory Visit (HOSPITAL_COMMUNITY): Payer: Self-pay | Admitting: *Deleted

## 2016-01-18 DIAGNOSIS — N184 Chronic kidney disease, stage 4 (severe): Secondary | ICD-10-CM | POA: Diagnosis not present

## 2016-01-18 DIAGNOSIS — E1122 Type 2 diabetes mellitus with diabetic chronic kidney disease: Secondary | ICD-10-CM | POA: Diagnosis not present

## 2016-01-18 DIAGNOSIS — Z7982 Long term (current) use of aspirin: Secondary | ICD-10-CM | POA: Diagnosis not present

## 2016-01-18 DIAGNOSIS — D631 Anemia in chronic kidney disease: Secondary | ICD-10-CM | POA: Diagnosis not present

## 2016-01-18 DIAGNOSIS — Z794 Long term (current) use of insulin: Secondary | ICD-10-CM | POA: Diagnosis not present

## 2016-01-18 DIAGNOSIS — I129 Hypertensive chronic kidney disease with stage 1 through stage 4 chronic kidney disease, or unspecified chronic kidney disease: Secondary | ICD-10-CM | POA: Diagnosis not present

## 2016-01-18 DIAGNOSIS — E1151 Type 2 diabetes mellitus with diabetic peripheral angiopathy without gangrene: Secondary | ICD-10-CM | POA: Diagnosis not present

## 2016-01-18 DIAGNOSIS — E43 Unspecified severe protein-calorie malnutrition: Secondary | ICD-10-CM | POA: Diagnosis not present

## 2016-01-18 DIAGNOSIS — E1142 Type 2 diabetes mellitus with diabetic polyneuropathy: Secondary | ICD-10-CM | POA: Diagnosis not present

## 2016-01-18 DIAGNOSIS — Z87440 Personal history of urinary (tract) infections: Secondary | ICD-10-CM | POA: Diagnosis not present

## 2016-01-18 DIAGNOSIS — Z940 Kidney transplant status: Secondary | ICD-10-CM | POA: Diagnosis not present

## 2016-01-18 DIAGNOSIS — Z89511 Acquired absence of right leg below knee: Secondary | ICD-10-CM | POA: Diagnosis not present

## 2016-01-18 DIAGNOSIS — Z792 Long term (current) use of antibiotics: Secondary | ICD-10-CM | POA: Diagnosis not present

## 2016-01-18 DIAGNOSIS — Z4781 Encounter for orthopedic aftercare following surgical amputation: Secondary | ICD-10-CM | POA: Diagnosis not present

## 2016-01-18 DIAGNOSIS — Z48815 Encounter for surgical aftercare following surgery on the digestive system: Secondary | ICD-10-CM | POA: Diagnosis not present

## 2016-01-19 ENCOUNTER — Ambulatory Visit (HOSPITAL_COMMUNITY)
Admission: RE | Admit: 2016-01-19 | Discharge: 2016-01-19 | Disposition: A | Discharge status: Home / Self Care | Payer: Medicare Other | Source: Ambulatory Visit | Attending: Nephrology | Admitting: Nephrology

## 2016-01-19 DIAGNOSIS — Z48815 Encounter for surgical aftercare following surgery on the digestive system: Secondary | ICD-10-CM | POA: Diagnosis not present

## 2016-01-19 DIAGNOSIS — N184 Chronic kidney disease, stage 4 (severe): Secondary | ICD-10-CM | POA: Diagnosis not present

## 2016-01-19 DIAGNOSIS — E1142 Type 2 diabetes mellitus with diabetic polyneuropathy: Secondary | ICD-10-CM | POA: Diagnosis not present

## 2016-01-19 DIAGNOSIS — Z87440 Personal history of urinary (tract) infections: Secondary | ICD-10-CM | POA: Diagnosis not present

## 2016-01-19 DIAGNOSIS — D631 Anemia in chronic kidney disease: Secondary | ICD-10-CM | POA: Diagnosis not present

## 2016-01-19 DIAGNOSIS — D638 Anemia in other chronic diseases classified elsewhere: Secondary | ICD-10-CM | POA: Insufficient documentation

## 2016-01-19 DIAGNOSIS — Z794 Long term (current) use of insulin: Secondary | ICD-10-CM | POA: Diagnosis not present

## 2016-01-19 DIAGNOSIS — Z792 Long term (current) use of antibiotics: Secondary | ICD-10-CM | POA: Diagnosis not present

## 2016-01-19 DIAGNOSIS — E1151 Type 2 diabetes mellitus with diabetic peripheral angiopathy without gangrene: Secondary | ICD-10-CM | POA: Diagnosis not present

## 2016-01-19 DIAGNOSIS — E1122 Type 2 diabetes mellitus with diabetic chronic kidney disease: Secondary | ICD-10-CM | POA: Diagnosis not present

## 2016-01-19 DIAGNOSIS — D509 Iron deficiency anemia, unspecified: Secondary | ICD-10-CM | POA: Insufficient documentation

## 2016-01-19 DIAGNOSIS — Z4781 Encounter for orthopedic aftercare following surgical amputation: Secondary | ICD-10-CM | POA: Diagnosis not present

## 2016-01-19 DIAGNOSIS — Z940 Kidney transplant status: Secondary | ICD-10-CM | POA: Diagnosis not present

## 2016-01-19 DIAGNOSIS — I129 Hypertensive chronic kidney disease with stage 1 through stage 4 chronic kidney disease, or unspecified chronic kidney disease: Secondary | ICD-10-CM | POA: Diagnosis not present

## 2016-01-19 DIAGNOSIS — E43 Unspecified severe protein-calorie malnutrition: Secondary | ICD-10-CM | POA: Diagnosis not present

## 2016-01-19 DIAGNOSIS — Z89511 Acquired absence of right leg below knee: Secondary | ICD-10-CM | POA: Diagnosis not present

## 2016-01-19 DIAGNOSIS — Z7982 Long term (current) use of aspirin: Secondary | ICD-10-CM | POA: Diagnosis not present

## 2016-01-19 LAB — POCT HEMOGLOBIN-HEMACUE: Hemoglobin: 10.1 g/dL — ABNORMAL LOW (ref 12.0–15.0)

## 2016-01-19 MED ORDER — EPOETIN ALFA 10000 UNIT/ML IJ SOLN
INTRAMUSCULAR | Status: AC
  Filled 2016-01-19: qty 1

## 2016-01-19 MED ORDER — EPOETIN ALFA 10000 UNIT/ML IJ SOLN
10000.0000 [IU] | INTRAMUSCULAR | Status: DC
  Administered 2016-01-19: 10000 [IU] via SUBCUTANEOUS

## 2016-01-19 MED ORDER — FERUMOXYTOL INJECTION 510 MG/17 ML
510.0000 mg | INTRAVENOUS | Status: DC
  Administered 2016-01-19: 510 mg via INTRAVENOUS
  Filled 2016-01-19: qty 17

## 2016-01-19 NOTE — Discharge Instructions (Signed)
Epoetin Alfa injection °What is this medicine? °EPOETIN ALFA (e POE e tin AL fa) helps your body make more red blood cells. This medicine is used to treat anemia caused by chronic kidney failure, cancer chemotherapy, or HIV-therapy. It may also be used before surgery if you have anemia. °This medicine may be used for other purposes; ask your health care provider or pharmacist if you have questions. °What should I tell my health care provider before I take this medicine? °They need to know if you have any of these conditions: °-blood clotting disorders °-cancer patient not on chemotherapy °-cystic fibrosis °-heart disease, such as angina or heart failure °-hemoglobin level of 12 g/dL or greater °-high blood pressure °-low levels of folate, iron, or vitamin B12 °-seizures °-an unusual or allergic reaction to erythropoietin, albumin, benzyl alcohol, hamster proteins, other medicines, foods, dyes, or preservatives °-pregnant or trying to get pregnant °-breast-feeding °How should I use this medicine? °This medicine is for injection into a vein or under the skin. It is usually given by a health care professional in a hospital or clinic setting. °If you get this medicine at home, you will be taught how to prepare and give this medicine. Use exactly as directed. Take your medicine at regular intervals. Do not take your medicine more often than directed. °It is important that you put your used needles and syringes in a special sharps container. Do not put them in a trash can. If you do not have a sharps container, call your pharmacist or healthcare provider to get one. °Talk to your pediatrician regarding the use of this medicine in children. While this drug may be prescribed for selected conditions, precautions do apply. °Overdosage: If you think you have taken too much of this medicine contact a poison control center or emergency room at once. °NOTE: This medicine is only for you. Do not share this medicine with  others. °What if I miss a dose? °If you miss a dose, take it as soon as you can. If it is almost time for your next dose, take only that dose. Do not take double or extra doses. °What may interact with this medicine? °Do not take this medicine with any of the following medications: °-darbepoetin alfa °This list may not describe all possible interactions. Give your health care provider a list of all the medicines, herbs, non-prescription drugs, or dietary supplements you use. Also tell them if you smoke, drink alcohol, or use illegal drugs. Some items may interact with your medicine. °What should I watch for while using this medicine? °Visit your prescriber or health care professional for regular checks on your progress and for the needed blood tests and blood pressure measurements. It is especially important for the doctor to make sure your hemoglobin level is in the desired range, to limit the risk of potential side effects and to give you the best benefit. Keep all appointments for any recommended tests. Check your blood pressure as directed. Ask your doctor what your blood pressure should be and when you should contact him or her. °As your body makes more red blood cells, you may need to take iron, folic acid, or vitamin B supplements. Ask your doctor or health care provider which products are right for you. If you have kidney disease continue dietary restrictions, even though this medication can make you feel better. Talk with your doctor or health care professional about the foods you eat and the vitamins that you take. °What side effects may I notice   from receiving this medicine? °Side effects that you should report to your doctor or health care professional as soon as possible: °-allergic reactions like skin rash, itching or hives, swelling of the face, lips, or tongue °-breathing problems °-changes in vision °-chest pain °-confusion, trouble speaking or understanding °-feeling faint or lightheaded,  falls °-high blood pressure °-muscle aches or pains °-pain, swelling, warmth in the leg °-rapid weight gain °-severe headaches °-sudden numbness or weakness of the face, arm or leg °-trouble walking, dizziness, loss of balance or coordination °-seizures (convulsions) °-swelling of the ankles, feet, hands °-unusually weak or tired °Side effects that usually do not require medical attention (report to your doctor or health care professional if they continue or are bothersome): °-diarrhea °-fever, chills (flu-like symptoms) °-headaches °-nausea, vomiting °-redness, stinging, or swelling at site where injected °This list may not describe all possible side effects. Call your doctor for medical advice about side effects. You may report side effects to FDA at 1-800-FDA-1088. °Where should I keep my medicine? °Keep out of the reach of children. °Store in a refrigerator between 2 and 8 degrees C (36 and 46 degrees F). Do not freeze or shake. Throw away any unused portion if using a single-dose vial. Multi-dose vials can be kept in the refrigerator for up to 21 days after the initial dose. Throw away unused medicine. °NOTE: This sheet is a summary. It may not cover all possible information. If you have questions about this medicine, talk to your doctor, pharmacist, or health care provider. °  °© 2016, [employer]. (2008-05-17 10:25:44) °Ferumoxytol injection °What is this medicine? °FERUMOXYTOL is an iron complex. Iron is used to make healthy red blood cells, which carry oxygen and nutrients throughout the body. This medicine is used to treat iron deficiency anemia in people with chronic kidney disease. °This medicine may be used for other purposes; ask your health care provider or pharmacist if you have questions. °What should I tell my health care provider before I take this medicine? °They need to know if you have any of these conditions: °-anemia not caused by low iron levels °-high levels of iron in the  blood °-magnetic resonance imaging (MRI) test scheduled °-an unusual or allergic reaction to iron, other medicines, foods, dyes, or preservatives °-pregnant or trying to get pregnant °-breast-feeding °How should I use this medicine? °This medicine is for injection into a vein. It is given by a health care professional in a hospital or clinic setting. °Talk to your pediatrician regarding the use of this medicine in children. Special care may be needed. °Overdosage: If you think you have taken too much of this medicine contact a poison control center or emergency room at once. °NOTE: This medicine is only for you. Do not share this medicine with others. °What if I miss a dose? °It is important not to miss your dose. Call your doctor or health care professional if you are unable to keep an appointment. °What may interact with this medicine? °This medicine may interact with the following medications: °-other iron products °This list may not describe all possible interactions. Give your health care provider a list of all the medicines, herbs, non-prescription drugs, or dietary supplements you use. Also tell them if you smoke, drink alcohol, or use illegal drugs. Some items may interact with your medicine. °What should I watch for while using this medicine? °Visit your doctor or healthcare professional regularly. Tell your doctor or healthcare professional if your symptoms do not start to get better   or if they get worse. You may need blood work done while you are taking this medicine. °You may need to follow a special diet. Talk to your doctor. Foods that contain iron include: whole grains/cereals, dried fruits, beans, or peas, leafy green vegetables, and organ meats (liver, kidney). °What side effects may I notice from receiving this medicine? °Side effects that you should report to your doctor or health care professional as soon as possible: °-allergic reactions like skin rash, itching or hives, swelling of the face,  lips, or tongue °-breathing problems °-changes in blood pressure °-feeling faint or lightheaded, falls °-fever or chills °-flushing, sweating, or hot feelings °-swelling of the ankles or feet °Side effects that usually do not require medical attention (Report these to your doctor or health care professional if they continue or are bothersome.): °-diarrhea °-headache °-nausea, vomiting °-stomach pain °This list may not describe all possible side effects. Call your doctor for medical advice about side effects. You may report side effects to FDA at 1-800-FDA-1088. °Where should I keep my medicine? °This drug is given in a hospital or clinic and will not be stored at home. °NOTE: This sheet is a summary. It may not cover all possible information. If you have questions about this medicine, talk to your doctor, pharmacist, or health care provider. °  °© 2016, [employer]. (2012-01-17 15:23:36) ° °

## 2016-01-22 DIAGNOSIS — Z792 Long term (current) use of antibiotics: Secondary | ICD-10-CM | POA: Diagnosis not present

## 2016-01-22 DIAGNOSIS — Z48815 Encounter for surgical aftercare following surgery on the digestive system: Secondary | ICD-10-CM | POA: Diagnosis not present

## 2016-01-22 DIAGNOSIS — E43 Unspecified severe protein-calorie malnutrition: Secondary | ICD-10-CM | POA: Diagnosis not present

## 2016-01-22 DIAGNOSIS — Z4781 Encounter for orthopedic aftercare following surgical amputation: Secondary | ICD-10-CM | POA: Diagnosis not present

## 2016-01-22 DIAGNOSIS — E1151 Type 2 diabetes mellitus with diabetic peripheral angiopathy without gangrene: Secondary | ICD-10-CM | POA: Diagnosis not present

## 2016-01-22 DIAGNOSIS — Z794 Long term (current) use of insulin: Secondary | ICD-10-CM | POA: Diagnosis not present

## 2016-01-22 DIAGNOSIS — E1122 Type 2 diabetes mellitus with diabetic chronic kidney disease: Secondary | ICD-10-CM | POA: Diagnosis not present

## 2016-01-22 DIAGNOSIS — E1142 Type 2 diabetes mellitus with diabetic polyneuropathy: Secondary | ICD-10-CM | POA: Diagnosis not present

## 2016-01-22 DIAGNOSIS — D631 Anemia in chronic kidney disease: Secondary | ICD-10-CM | POA: Diagnosis not present

## 2016-01-22 DIAGNOSIS — N184 Chronic kidney disease, stage 4 (severe): Secondary | ICD-10-CM | POA: Diagnosis not present

## 2016-01-22 DIAGNOSIS — Z89511 Acquired absence of right leg below knee: Secondary | ICD-10-CM | POA: Diagnosis not present

## 2016-01-22 DIAGNOSIS — Z7982 Long term (current) use of aspirin: Secondary | ICD-10-CM | POA: Diagnosis not present

## 2016-01-22 DIAGNOSIS — I129 Hypertensive chronic kidney disease with stage 1 through stage 4 chronic kidney disease, or unspecified chronic kidney disease: Secondary | ICD-10-CM | POA: Diagnosis not present

## 2016-01-22 DIAGNOSIS — Z940 Kidney transplant status: Secondary | ICD-10-CM | POA: Diagnosis not present

## 2016-01-22 DIAGNOSIS — Z87440 Personal history of urinary (tract) infections: Secondary | ICD-10-CM | POA: Diagnosis not present

## 2016-01-25 DIAGNOSIS — N184 Chronic kidney disease, stage 4 (severe): Secondary | ICD-10-CM | POA: Diagnosis not present

## 2016-01-25 DIAGNOSIS — Z940 Kidney transplant status: Secondary | ICD-10-CM | POA: Diagnosis not present

## 2016-01-25 DIAGNOSIS — E43 Unspecified severe protein-calorie malnutrition: Secondary | ICD-10-CM | POA: Diagnosis not present

## 2016-01-25 DIAGNOSIS — Z89511 Acquired absence of right leg below knee: Secondary | ICD-10-CM | POA: Diagnosis not present

## 2016-01-25 DIAGNOSIS — E1142 Type 2 diabetes mellitus with diabetic polyneuropathy: Secondary | ICD-10-CM | POA: Diagnosis not present

## 2016-01-25 DIAGNOSIS — Z7982 Long term (current) use of aspirin: Secondary | ICD-10-CM | POA: Diagnosis not present

## 2016-01-25 DIAGNOSIS — D631 Anemia in chronic kidney disease: Secondary | ICD-10-CM | POA: Diagnosis not present

## 2016-01-25 DIAGNOSIS — Z4822 Encounter for aftercare following kidney transplant: Secondary | ICD-10-CM | POA: Diagnosis not present

## 2016-01-25 DIAGNOSIS — E1122 Type 2 diabetes mellitus with diabetic chronic kidney disease: Secondary | ICD-10-CM | POA: Diagnosis not present

## 2016-01-25 DIAGNOSIS — E1151 Type 2 diabetes mellitus with diabetic peripheral angiopathy without gangrene: Secondary | ICD-10-CM | POA: Diagnosis not present

## 2016-01-25 DIAGNOSIS — Z794 Long term (current) use of insulin: Secondary | ICD-10-CM | POA: Diagnosis not present

## 2016-01-25 DIAGNOSIS — E119 Type 2 diabetes mellitus without complications: Secondary | ICD-10-CM | POA: Diagnosis not present

## 2016-01-25 DIAGNOSIS — Z4781 Encounter for orthopedic aftercare following surgical amputation: Secondary | ICD-10-CM | POA: Diagnosis not present

## 2016-01-25 DIAGNOSIS — Z792 Long term (current) use of antibiotics: Secondary | ICD-10-CM | POA: Diagnosis not present

## 2016-01-25 DIAGNOSIS — Z48815 Encounter for surgical aftercare following surgery on the digestive system: Secondary | ICD-10-CM | POA: Diagnosis not present

## 2016-01-25 DIAGNOSIS — I129 Hypertensive chronic kidney disease with stage 1 through stage 4 chronic kidney disease, or unspecified chronic kidney disease: Secondary | ICD-10-CM | POA: Diagnosis not present

## 2016-01-25 DIAGNOSIS — Z87440 Personal history of urinary (tract) infections: Secondary | ICD-10-CM | POA: Diagnosis not present

## 2016-01-25 DIAGNOSIS — D899 Disorder involving the immune mechanism, unspecified: Secondary | ICD-10-CM | POA: Diagnosis not present

## 2016-01-25 DIAGNOSIS — Z79899 Other long term (current) drug therapy: Secondary | ICD-10-CM | POA: Diagnosis not present

## 2016-01-26 ENCOUNTER — Ambulatory Visit (HOSPITAL_COMMUNITY)
Admission: RE | Admit: 2016-01-26 | Discharge: 2016-01-26 | Disposition: A | Discharge status: Home / Self Care | Payer: Medicare Other | Source: Ambulatory Visit | Attending: Nephrology | Admitting: Nephrology

## 2016-01-26 DIAGNOSIS — N183 Chronic kidney disease, stage 3 (moderate): Secondary | ICD-10-CM | POA: Insufficient documentation

## 2016-01-26 DIAGNOSIS — Z794 Long term (current) use of insulin: Secondary | ICD-10-CM | POA: Diagnosis not present

## 2016-01-26 DIAGNOSIS — E119 Type 2 diabetes mellitus without complications: Secondary | ICD-10-CM | POA: Diagnosis not present

## 2016-01-26 DIAGNOSIS — Z4822 Encounter for aftercare following kidney transplant: Secondary | ICD-10-CM | POA: Diagnosis not present

## 2016-01-26 DIAGNOSIS — Z89511 Acquired absence of right leg below knee: Secondary | ICD-10-CM | POA: Diagnosis not present

## 2016-01-26 DIAGNOSIS — D638 Anemia in other chronic diseases classified elsewhere: Secondary | ICD-10-CM | POA: Insufficient documentation

## 2016-01-26 DIAGNOSIS — D899 Disorder involving the immune mechanism, unspecified: Secondary | ICD-10-CM | POA: Diagnosis not present

## 2016-01-26 DIAGNOSIS — Z79899 Other long term (current) drug therapy: Secondary | ICD-10-CM | POA: Diagnosis not present

## 2016-01-26 MED ORDER — FERUMOXYTOL INJECTION 510 MG/17 ML
510.0000 mg | INTRAVENOUS | Status: DC
  Administered 2016-01-26: 510 mg via INTRAVENOUS
  Filled 2016-01-26: qty 17

## 2016-01-29 DIAGNOSIS — D631 Anemia in chronic kidney disease: Secondary | ICD-10-CM | POA: Diagnosis not present

## 2016-01-29 DIAGNOSIS — Z87440 Personal history of urinary (tract) infections: Secondary | ICD-10-CM | POA: Diagnosis not present

## 2016-01-29 DIAGNOSIS — Z48815 Encounter for surgical aftercare following surgery on the digestive system: Secondary | ICD-10-CM | POA: Diagnosis not present

## 2016-01-29 DIAGNOSIS — E1151 Type 2 diabetes mellitus with diabetic peripheral angiopathy without gangrene: Secondary | ICD-10-CM | POA: Diagnosis not present

## 2016-01-29 DIAGNOSIS — E1142 Type 2 diabetes mellitus with diabetic polyneuropathy: Secondary | ICD-10-CM | POA: Diagnosis not present

## 2016-01-29 DIAGNOSIS — Z4781 Encounter for orthopedic aftercare following surgical amputation: Secondary | ICD-10-CM | POA: Diagnosis not present

## 2016-01-29 DIAGNOSIS — Z89511 Acquired absence of right leg below knee: Secondary | ICD-10-CM | POA: Diagnosis not present

## 2016-01-29 DIAGNOSIS — Z794 Long term (current) use of insulin: Secondary | ICD-10-CM | POA: Diagnosis not present

## 2016-01-29 DIAGNOSIS — E1122 Type 2 diabetes mellitus with diabetic chronic kidney disease: Secondary | ICD-10-CM | POA: Diagnosis not present

## 2016-01-29 DIAGNOSIS — N184 Chronic kidney disease, stage 4 (severe): Secondary | ICD-10-CM | POA: Diagnosis not present

## 2016-01-29 DIAGNOSIS — E43 Unspecified severe protein-calorie malnutrition: Secondary | ICD-10-CM | POA: Diagnosis not present

## 2016-01-29 DIAGNOSIS — Z792 Long term (current) use of antibiotics: Secondary | ICD-10-CM | POA: Diagnosis not present

## 2016-01-29 DIAGNOSIS — Z940 Kidney transplant status: Secondary | ICD-10-CM | POA: Diagnosis not present

## 2016-01-29 DIAGNOSIS — Z7982 Long term (current) use of aspirin: Secondary | ICD-10-CM | POA: Diagnosis not present

## 2016-01-29 DIAGNOSIS — I129 Hypertensive chronic kidney disease with stage 1 through stage 4 chronic kidney disease, or unspecified chronic kidney disease: Secondary | ICD-10-CM | POA: Diagnosis not present

## 2016-01-31 DIAGNOSIS — Z4781 Encounter for orthopedic aftercare following surgical amputation: Secondary | ICD-10-CM | POA: Diagnosis not present

## 2016-01-31 DIAGNOSIS — Z87440 Personal history of urinary (tract) infections: Secondary | ICD-10-CM | POA: Diagnosis not present

## 2016-01-31 DIAGNOSIS — Z940 Kidney transplant status: Secondary | ICD-10-CM | POA: Diagnosis not present

## 2016-01-31 DIAGNOSIS — Z89511 Acquired absence of right leg below knee: Secondary | ICD-10-CM | POA: Diagnosis not present

## 2016-01-31 DIAGNOSIS — E43 Unspecified severe protein-calorie malnutrition: Secondary | ICD-10-CM | POA: Diagnosis not present

## 2016-01-31 DIAGNOSIS — E1142 Type 2 diabetes mellitus with diabetic polyneuropathy: Secondary | ICD-10-CM | POA: Diagnosis not present

## 2016-01-31 DIAGNOSIS — E1122 Type 2 diabetes mellitus with diabetic chronic kidney disease: Secondary | ICD-10-CM | POA: Diagnosis not present

## 2016-01-31 DIAGNOSIS — N184 Chronic kidney disease, stage 4 (severe): Secondary | ICD-10-CM | POA: Diagnosis not present

## 2016-01-31 DIAGNOSIS — Z794 Long term (current) use of insulin: Secondary | ICD-10-CM | POA: Diagnosis not present

## 2016-01-31 DIAGNOSIS — Z48815 Encounter for surgical aftercare following surgery on the digestive system: Secondary | ICD-10-CM | POA: Diagnosis not present

## 2016-01-31 DIAGNOSIS — Z792 Long term (current) use of antibiotics: Secondary | ICD-10-CM | POA: Diagnosis not present

## 2016-01-31 DIAGNOSIS — I129 Hypertensive chronic kidney disease with stage 1 through stage 4 chronic kidney disease, or unspecified chronic kidney disease: Secondary | ICD-10-CM | POA: Diagnosis not present

## 2016-01-31 DIAGNOSIS — E1151 Type 2 diabetes mellitus with diabetic peripheral angiopathy without gangrene: Secondary | ICD-10-CM | POA: Diagnosis not present

## 2016-01-31 DIAGNOSIS — Z7982 Long term (current) use of aspirin: Secondary | ICD-10-CM | POA: Diagnosis not present

## 2016-01-31 DIAGNOSIS — D631 Anemia in chronic kidney disease: Secondary | ICD-10-CM | POA: Diagnosis not present

## 2016-02-02 ENCOUNTER — Encounter (HOSPITAL_COMMUNITY)
Admission: RE | Admit: 2016-02-02 | Discharge: 2016-02-02 | Disposition: A | Discharge status: Home / Self Care | Payer: Medicare Other | Source: Ambulatory Visit | Attending: Nephrology | Admitting: Nephrology

## 2016-02-02 DIAGNOSIS — I129 Hypertensive chronic kidney disease with stage 1 through stage 4 chronic kidney disease, or unspecified chronic kidney disease: Secondary | ICD-10-CM | POA: Diagnosis not present

## 2016-02-02 DIAGNOSIS — Z940 Kidney transplant status: Secondary | ICD-10-CM | POA: Diagnosis not present

## 2016-02-02 DIAGNOSIS — Z4781 Encounter for orthopedic aftercare following surgical amputation: Secondary | ICD-10-CM | POA: Diagnosis not present

## 2016-02-02 DIAGNOSIS — E1142 Type 2 diabetes mellitus with diabetic polyneuropathy: Secondary | ICD-10-CM | POA: Diagnosis not present

## 2016-02-02 DIAGNOSIS — Z7982 Long term (current) use of aspirin: Secondary | ICD-10-CM | POA: Diagnosis not present

## 2016-02-02 DIAGNOSIS — Z87440 Personal history of urinary (tract) infections: Secondary | ICD-10-CM | POA: Diagnosis not present

## 2016-02-02 DIAGNOSIS — E1122 Type 2 diabetes mellitus with diabetic chronic kidney disease: Secondary | ICD-10-CM | POA: Diagnosis not present

## 2016-02-02 DIAGNOSIS — Z48815 Encounter for surgical aftercare following surgery on the digestive system: Secondary | ICD-10-CM | POA: Diagnosis not present

## 2016-02-02 DIAGNOSIS — N184 Chronic kidney disease, stage 4 (severe): Secondary | ICD-10-CM

## 2016-02-02 DIAGNOSIS — D631 Anemia in chronic kidney disease: Secondary | ICD-10-CM | POA: Diagnosis not present

## 2016-02-02 DIAGNOSIS — D509 Iron deficiency anemia, unspecified: Secondary | ICD-10-CM | POA: Insufficient documentation

## 2016-02-02 DIAGNOSIS — Z794 Long term (current) use of insulin: Secondary | ICD-10-CM | POA: Diagnosis not present

## 2016-02-02 DIAGNOSIS — E43 Unspecified severe protein-calorie malnutrition: Secondary | ICD-10-CM | POA: Diagnosis not present

## 2016-02-02 DIAGNOSIS — E1151 Type 2 diabetes mellitus with diabetic peripheral angiopathy without gangrene: Secondary | ICD-10-CM | POA: Diagnosis not present

## 2016-02-02 DIAGNOSIS — Z89511 Acquired absence of right leg below knee: Secondary | ICD-10-CM | POA: Diagnosis not present

## 2016-02-02 DIAGNOSIS — Z792 Long term (current) use of antibiotics: Secondary | ICD-10-CM | POA: Diagnosis not present

## 2016-02-02 LAB — FERRITIN: Ferritin: 2112 ng/mL — ABNORMAL HIGH (ref 11–307)

## 2016-02-02 LAB — IRON AND TIBC
Iron: 96 ug/dL (ref 28–170)
Saturation Ratios: 33 % — ABNORMAL HIGH (ref 10.4–31.8)
TIBC: 293 ug/dL (ref 250–450)
UIBC: 197 ug/dL

## 2016-02-02 MED ORDER — EPOETIN ALFA 10000 UNIT/ML IJ SOLN: 10000.0000 [IU] | INTRAMUSCULAR | Status: DC

## 2016-02-05 LAB — POCT HEMOGLOBIN-HEMACUE: Hemoglobin: 13.3 g/dL (ref 12.0–15.0)

## 2016-02-06 DIAGNOSIS — N184 Chronic kidney disease, stage 4 (severe): Secondary | ICD-10-CM | POA: Diagnosis not present

## 2016-02-06 DIAGNOSIS — E1142 Type 2 diabetes mellitus with diabetic polyneuropathy: Secondary | ICD-10-CM | POA: Diagnosis not present

## 2016-02-06 DIAGNOSIS — E43 Unspecified severe protein-calorie malnutrition: Secondary | ICD-10-CM | POA: Diagnosis not present

## 2016-02-06 DIAGNOSIS — Z792 Long term (current) use of antibiotics: Secondary | ICD-10-CM | POA: Diagnosis not present

## 2016-02-06 DIAGNOSIS — D631 Anemia in chronic kidney disease: Secondary | ICD-10-CM | POA: Diagnosis not present

## 2016-02-06 DIAGNOSIS — Z4781 Encounter for orthopedic aftercare following surgical amputation: Secondary | ICD-10-CM | POA: Diagnosis not present

## 2016-02-06 DIAGNOSIS — Z940 Kidney transplant status: Secondary | ICD-10-CM | POA: Diagnosis not present

## 2016-02-06 DIAGNOSIS — E1151 Type 2 diabetes mellitus with diabetic peripheral angiopathy without gangrene: Secondary | ICD-10-CM | POA: Diagnosis not present

## 2016-02-06 DIAGNOSIS — Z7982 Long term (current) use of aspirin: Secondary | ICD-10-CM | POA: Diagnosis not present

## 2016-02-06 DIAGNOSIS — E1122 Type 2 diabetes mellitus with diabetic chronic kidney disease: Secondary | ICD-10-CM | POA: Diagnosis not present

## 2016-02-06 DIAGNOSIS — Z87440 Personal history of urinary (tract) infections: Secondary | ICD-10-CM | POA: Diagnosis not present

## 2016-02-06 DIAGNOSIS — I129 Hypertensive chronic kidney disease with stage 1 through stage 4 chronic kidney disease, or unspecified chronic kidney disease: Secondary | ICD-10-CM | POA: Diagnosis not present

## 2016-02-06 DIAGNOSIS — Z794 Long term (current) use of insulin: Secondary | ICD-10-CM | POA: Diagnosis not present

## 2016-02-06 DIAGNOSIS — Z89511 Acquired absence of right leg below knee: Secondary | ICD-10-CM | POA: Diagnosis not present

## 2016-02-06 DIAGNOSIS — Z48815 Encounter for surgical aftercare following surgery on the digestive system: Secondary | ICD-10-CM | POA: Diagnosis not present

## 2016-02-08 DIAGNOSIS — Z940 Kidney transplant status: Secondary | ICD-10-CM | POA: Diagnosis not present

## 2016-02-08 DIAGNOSIS — E1122 Type 2 diabetes mellitus with diabetic chronic kidney disease: Secondary | ICD-10-CM | POA: Diagnosis not present

## 2016-02-08 DIAGNOSIS — D631 Anemia in chronic kidney disease: Secondary | ICD-10-CM | POA: Diagnosis not present

## 2016-02-08 DIAGNOSIS — E1151 Type 2 diabetes mellitus with diabetic peripheral angiopathy without gangrene: Secondary | ICD-10-CM | POA: Diagnosis not present

## 2016-02-08 DIAGNOSIS — Z89511 Acquired absence of right leg below knee: Secondary | ICD-10-CM | POA: Diagnosis not present

## 2016-02-08 DIAGNOSIS — N184 Chronic kidney disease, stage 4 (severe): Secondary | ICD-10-CM | POA: Diagnosis not present

## 2016-02-08 DIAGNOSIS — I129 Hypertensive chronic kidney disease with stage 1 through stage 4 chronic kidney disease, or unspecified chronic kidney disease: Secondary | ICD-10-CM | POA: Diagnosis not present

## 2016-02-08 DIAGNOSIS — Z792 Long term (current) use of antibiotics: Secondary | ICD-10-CM | POA: Diagnosis not present

## 2016-02-08 DIAGNOSIS — E43 Unspecified severe protein-calorie malnutrition: Secondary | ICD-10-CM | POA: Diagnosis not present

## 2016-02-08 DIAGNOSIS — E1142 Type 2 diabetes mellitus with diabetic polyneuropathy: Secondary | ICD-10-CM | POA: Diagnosis not present

## 2016-02-08 DIAGNOSIS — Z7982 Long term (current) use of aspirin: Secondary | ICD-10-CM | POA: Diagnosis not present

## 2016-02-08 DIAGNOSIS — Z794 Long term (current) use of insulin: Secondary | ICD-10-CM | POA: Diagnosis not present

## 2016-02-08 DIAGNOSIS — Z87440 Personal history of urinary (tract) infections: Secondary | ICD-10-CM | POA: Diagnosis not present

## 2016-02-08 DIAGNOSIS — Z48815 Encounter for surgical aftercare following surgery on the digestive system: Secondary | ICD-10-CM | POA: Diagnosis not present

## 2016-02-08 DIAGNOSIS — Z4781 Encounter for orthopedic aftercare following surgical amputation: Secondary | ICD-10-CM | POA: Diagnosis not present

## 2016-02-09 DIAGNOSIS — E1151 Type 2 diabetes mellitus with diabetic peripheral angiopathy without gangrene: Secondary | ICD-10-CM | POA: Diagnosis not present

## 2016-02-09 DIAGNOSIS — I129 Hypertensive chronic kidney disease with stage 1 through stage 4 chronic kidney disease, or unspecified chronic kidney disease: Secondary | ICD-10-CM | POA: Diagnosis not present

## 2016-02-09 DIAGNOSIS — D631 Anemia in chronic kidney disease: Secondary | ICD-10-CM | POA: Diagnosis not present

## 2016-02-09 DIAGNOSIS — Z48815 Encounter for surgical aftercare following surgery on the digestive system: Secondary | ICD-10-CM | POA: Diagnosis not present

## 2016-02-09 DIAGNOSIS — Z940 Kidney transplant status: Secondary | ICD-10-CM | POA: Diagnosis not present

## 2016-02-09 DIAGNOSIS — Z89511 Acquired absence of right leg below knee: Secondary | ICD-10-CM | POA: Diagnosis not present

## 2016-02-09 DIAGNOSIS — E1122 Type 2 diabetes mellitus with diabetic chronic kidney disease: Secondary | ICD-10-CM | POA: Diagnosis not present

## 2016-02-09 DIAGNOSIS — Z792 Long term (current) use of antibiotics: Secondary | ICD-10-CM | POA: Diagnosis not present

## 2016-02-09 DIAGNOSIS — Z4781 Encounter for orthopedic aftercare following surgical amputation: Secondary | ICD-10-CM | POA: Diagnosis not present

## 2016-02-09 DIAGNOSIS — Z794 Long term (current) use of insulin: Secondary | ICD-10-CM | POA: Diagnosis not present

## 2016-02-09 DIAGNOSIS — E1142 Type 2 diabetes mellitus with diabetic polyneuropathy: Secondary | ICD-10-CM | POA: Diagnosis not present

## 2016-02-09 DIAGNOSIS — Z7982 Long term (current) use of aspirin: Secondary | ICD-10-CM | POA: Diagnosis not present

## 2016-02-09 DIAGNOSIS — E43 Unspecified severe protein-calorie malnutrition: Secondary | ICD-10-CM | POA: Diagnosis not present

## 2016-02-09 DIAGNOSIS — Z87440 Personal history of urinary (tract) infections: Secondary | ICD-10-CM | POA: Diagnosis not present

## 2016-02-09 DIAGNOSIS — N184 Chronic kidney disease, stage 4 (severe): Secondary | ICD-10-CM | POA: Diagnosis not present

## 2016-02-12 DIAGNOSIS — E43 Unspecified severe protein-calorie malnutrition: Secondary | ICD-10-CM | POA: Diagnosis not present

## 2016-02-12 DIAGNOSIS — I129 Hypertensive chronic kidney disease with stage 1 through stage 4 chronic kidney disease, or unspecified chronic kidney disease: Secondary | ICD-10-CM | POA: Diagnosis not present

## 2016-02-12 DIAGNOSIS — Z792 Long term (current) use of antibiotics: Secondary | ICD-10-CM | POA: Diagnosis not present

## 2016-02-12 DIAGNOSIS — Z794 Long term (current) use of insulin: Secondary | ICD-10-CM | POA: Diagnosis not present

## 2016-02-12 DIAGNOSIS — Z87440 Personal history of urinary (tract) infections: Secondary | ICD-10-CM | POA: Diagnosis not present

## 2016-02-12 DIAGNOSIS — N184 Chronic kidney disease, stage 4 (severe): Secondary | ICD-10-CM | POA: Diagnosis not present

## 2016-02-12 DIAGNOSIS — E1122 Type 2 diabetes mellitus with diabetic chronic kidney disease: Secondary | ICD-10-CM | POA: Diagnosis not present

## 2016-02-12 DIAGNOSIS — Z48815 Encounter for surgical aftercare following surgery on the digestive system: Secondary | ICD-10-CM | POA: Diagnosis not present

## 2016-02-12 DIAGNOSIS — Z4781 Encounter for orthopedic aftercare following surgical amputation: Secondary | ICD-10-CM | POA: Diagnosis not present

## 2016-02-12 DIAGNOSIS — E1142 Type 2 diabetes mellitus with diabetic polyneuropathy: Secondary | ICD-10-CM | POA: Diagnosis not present

## 2016-02-12 DIAGNOSIS — E1151 Type 2 diabetes mellitus with diabetic peripheral angiopathy without gangrene: Secondary | ICD-10-CM | POA: Diagnosis not present

## 2016-02-12 DIAGNOSIS — Z7982 Long term (current) use of aspirin: Secondary | ICD-10-CM | POA: Diagnosis not present

## 2016-02-12 DIAGNOSIS — Z89511 Acquired absence of right leg below knee: Secondary | ICD-10-CM | POA: Diagnosis not present

## 2016-02-12 DIAGNOSIS — Z940 Kidney transplant status: Secondary | ICD-10-CM | POA: Diagnosis not present

## 2016-02-12 DIAGNOSIS — D631 Anemia in chronic kidney disease: Secondary | ICD-10-CM | POA: Diagnosis not present

## 2016-02-13 DIAGNOSIS — D631 Anemia in chronic kidney disease: Secondary | ICD-10-CM | POA: Diagnosis not present

## 2016-02-13 DIAGNOSIS — Z48815 Encounter for surgical aftercare following surgery on the digestive system: Secondary | ICD-10-CM | POA: Diagnosis not present

## 2016-02-13 DIAGNOSIS — I129 Hypertensive chronic kidney disease with stage 1 through stage 4 chronic kidney disease, or unspecified chronic kidney disease: Secondary | ICD-10-CM | POA: Diagnosis not present

## 2016-02-13 DIAGNOSIS — Z7982 Long term (current) use of aspirin: Secondary | ICD-10-CM | POA: Diagnosis not present

## 2016-02-13 DIAGNOSIS — Z792 Long term (current) use of antibiotics: Secondary | ICD-10-CM | POA: Diagnosis not present

## 2016-02-13 DIAGNOSIS — E1142 Type 2 diabetes mellitus with diabetic polyneuropathy: Secondary | ICD-10-CM | POA: Diagnosis not present

## 2016-02-13 DIAGNOSIS — Z4781 Encounter for orthopedic aftercare following surgical amputation: Secondary | ICD-10-CM | POA: Diagnosis not present

## 2016-02-13 DIAGNOSIS — E43 Unspecified severe protein-calorie malnutrition: Secondary | ICD-10-CM | POA: Diagnosis not present

## 2016-02-13 DIAGNOSIS — Z89511 Acquired absence of right leg below knee: Secondary | ICD-10-CM | POA: Diagnosis not present

## 2016-02-13 DIAGNOSIS — Z794 Long term (current) use of insulin: Secondary | ICD-10-CM | POA: Diagnosis not present

## 2016-02-13 DIAGNOSIS — Z87440 Personal history of urinary (tract) infections: Secondary | ICD-10-CM | POA: Diagnosis not present

## 2016-02-13 DIAGNOSIS — Z940 Kidney transplant status: Secondary | ICD-10-CM | POA: Diagnosis not present

## 2016-02-13 DIAGNOSIS — E1151 Type 2 diabetes mellitus with diabetic peripheral angiopathy without gangrene: Secondary | ICD-10-CM | POA: Diagnosis not present

## 2016-02-13 DIAGNOSIS — N184 Chronic kidney disease, stage 4 (severe): Secondary | ICD-10-CM | POA: Diagnosis not present

## 2016-02-13 DIAGNOSIS — E1122 Type 2 diabetes mellitus with diabetic chronic kidney disease: Secondary | ICD-10-CM | POA: Diagnosis not present

## 2016-02-16 ENCOUNTER — Encounter (HOSPITAL_COMMUNITY)
Admission: RE | Admit: 2016-02-16 | Discharge: 2016-02-16 | Disposition: A | Discharge status: Home / Self Care | Payer: Medicare Other | Source: Ambulatory Visit | Attending: Nephrology | Admitting: Nephrology

## 2016-02-16 DIAGNOSIS — D509 Iron deficiency anemia, unspecified: Secondary | ICD-10-CM | POA: Diagnosis present

## 2016-02-16 DIAGNOSIS — E43 Unspecified severe protein-calorie malnutrition: Secondary | ICD-10-CM | POA: Diagnosis not present

## 2016-02-16 DIAGNOSIS — I129 Hypertensive chronic kidney disease with stage 1 through stage 4 chronic kidney disease, or unspecified chronic kidney disease: Secondary | ICD-10-CM | POA: Diagnosis not present

## 2016-02-16 DIAGNOSIS — Z794 Long term (current) use of insulin: Secondary | ICD-10-CM | POA: Diagnosis not present

## 2016-02-16 DIAGNOSIS — E1151 Type 2 diabetes mellitus with diabetic peripheral angiopathy without gangrene: Secondary | ICD-10-CM | POA: Diagnosis not present

## 2016-02-16 DIAGNOSIS — D631 Anemia in chronic kidney disease: Secondary | ICD-10-CM | POA: Diagnosis not present

## 2016-02-16 DIAGNOSIS — Z89511 Acquired absence of right leg below knee: Secondary | ICD-10-CM | POA: Diagnosis not present

## 2016-02-16 DIAGNOSIS — N184 Chronic kidney disease, stage 4 (severe): Secondary | ICD-10-CM | POA: Diagnosis not present

## 2016-02-16 DIAGNOSIS — Z87440 Personal history of urinary (tract) infections: Secondary | ICD-10-CM | POA: Diagnosis not present

## 2016-02-16 DIAGNOSIS — E1142 Type 2 diabetes mellitus with diabetic polyneuropathy: Secondary | ICD-10-CM | POA: Diagnosis not present

## 2016-02-16 DIAGNOSIS — Z7982 Long term (current) use of aspirin: Secondary | ICD-10-CM | POA: Diagnosis not present

## 2016-02-16 DIAGNOSIS — Z792 Long term (current) use of antibiotics: Secondary | ICD-10-CM | POA: Diagnosis not present

## 2016-02-16 DIAGNOSIS — Z48815 Encounter for surgical aftercare following surgery on the digestive system: Secondary | ICD-10-CM | POA: Diagnosis not present

## 2016-02-16 DIAGNOSIS — E1122 Type 2 diabetes mellitus with diabetic chronic kidney disease: Secondary | ICD-10-CM | POA: Diagnosis not present

## 2016-02-16 DIAGNOSIS — Z940 Kidney transplant status: Secondary | ICD-10-CM | POA: Diagnosis not present

## 2016-02-16 DIAGNOSIS — Z4781 Encounter for orthopedic aftercare following surgical amputation: Secondary | ICD-10-CM | POA: Diagnosis not present

## 2016-02-16 LAB — POCT HEMOGLOBIN-HEMACUE: Hemoglobin: 12.3 g/dL (ref 12.0–15.0)

## 2016-02-16 MED ORDER — EPOETIN ALFA 10000 UNIT/ML IJ SOLN: 10000.0000 [IU] | INTRAMUSCULAR | Status: DC

## 2016-02-21 DIAGNOSIS — E119 Type 2 diabetes mellitus without complications: Secondary | ICD-10-CM | POA: Diagnosis not present

## 2016-02-21 DIAGNOSIS — I10 Essential (primary) hypertension: Secondary | ICD-10-CM | POA: Diagnosis not present

## 2016-02-21 DIAGNOSIS — Z940 Kidney transplant status: Secondary | ICD-10-CM | POA: Diagnosis not present

## 2016-02-21 DIAGNOSIS — Z992 Dependence on renal dialysis: Secondary | ICD-10-CM | POA: Diagnosis not present

## 2016-02-22 DIAGNOSIS — I129 Hypertensive chronic kidney disease with stage 1 through stage 4 chronic kidney disease, or unspecified chronic kidney disease: Secondary | ICD-10-CM | POA: Diagnosis not present

## 2016-02-22 DIAGNOSIS — Z792 Long term (current) use of antibiotics: Secondary | ICD-10-CM | POA: Diagnosis not present

## 2016-02-22 DIAGNOSIS — Z89511 Acquired absence of right leg below knee: Secondary | ICD-10-CM | POA: Diagnosis not present

## 2016-02-22 DIAGNOSIS — D631 Anemia in chronic kidney disease: Secondary | ICD-10-CM | POA: Diagnosis not present

## 2016-02-22 DIAGNOSIS — Z4781 Encounter for orthopedic aftercare following surgical amputation: Secondary | ICD-10-CM | POA: Diagnosis not present

## 2016-02-22 DIAGNOSIS — E43 Unspecified severe protein-calorie malnutrition: Secondary | ICD-10-CM | POA: Diagnosis not present

## 2016-02-22 DIAGNOSIS — Z7982 Long term (current) use of aspirin: Secondary | ICD-10-CM | POA: Diagnosis not present

## 2016-02-22 DIAGNOSIS — N184 Chronic kidney disease, stage 4 (severe): Secondary | ICD-10-CM | POA: Diagnosis not present

## 2016-02-22 DIAGNOSIS — Z48815 Encounter for surgical aftercare following surgery on the digestive system: Secondary | ICD-10-CM | POA: Diagnosis not present

## 2016-02-22 DIAGNOSIS — E1122 Type 2 diabetes mellitus with diabetic chronic kidney disease: Secondary | ICD-10-CM | POA: Diagnosis not present

## 2016-02-22 DIAGNOSIS — Z940 Kidney transplant status: Secondary | ICD-10-CM | POA: Diagnosis not present

## 2016-02-22 DIAGNOSIS — Z794 Long term (current) use of insulin: Secondary | ICD-10-CM | POA: Diagnosis not present

## 2016-02-22 DIAGNOSIS — E1142 Type 2 diabetes mellitus with diabetic polyneuropathy: Secondary | ICD-10-CM | POA: Diagnosis not present

## 2016-02-22 DIAGNOSIS — Z87440 Personal history of urinary (tract) infections: Secondary | ICD-10-CM | POA: Diagnosis not present

## 2016-02-22 DIAGNOSIS — E1151 Type 2 diabetes mellitus with diabetic peripheral angiopathy without gangrene: Secondary | ICD-10-CM | POA: Diagnosis not present

## 2016-02-23 DIAGNOSIS — I10 Essential (primary) hypertension: Secondary | ICD-10-CM | POA: Diagnosis not present

## 2016-02-23 DIAGNOSIS — I739 Peripheral vascular disease, unspecified: Secondary | ICD-10-CM | POA: Diagnosis not present

## 2016-02-23 DIAGNOSIS — Z940 Kidney transplant status: Secondary | ICD-10-CM | POA: Diagnosis not present

## 2016-03-01 ENCOUNTER — Encounter (HOSPITAL_COMMUNITY)
Admission: RE | Admit: 2016-03-01 | Discharge: 2016-03-01 | Disposition: A | Discharge status: Home / Self Care | Payer: Medicare Other | Source: Ambulatory Visit | Attending: Nephrology | Admitting: Nephrology

## 2016-03-01 DIAGNOSIS — N184 Chronic kidney disease, stage 4 (severe): Secondary | ICD-10-CM

## 2016-03-01 DIAGNOSIS — D509 Iron deficiency anemia, unspecified: Secondary | ICD-10-CM | POA: Diagnosis not present

## 2016-03-01 LAB — IRON AND TIBC
Iron: 218 ug/dL — ABNORMAL HIGH (ref 28–170)
Saturation Ratios: 77 % — ABNORMAL HIGH (ref 10.4–31.8)
TIBC: 284 ug/dL (ref 250–450)
UIBC: 66 ug/dL

## 2016-03-01 LAB — POCT HEMOGLOBIN-HEMACUE: Hemoglobin: 12.8 g/dL (ref 12.0–15.0)

## 2016-03-01 LAB — FERRITIN: Ferritin: 1788 ng/mL — ABNORMAL HIGH (ref 11–307)

## 2016-03-01 MED ORDER — EPOETIN ALFA 10000 UNIT/ML IJ SOLN
10000.0000 [IU] | INTRAMUSCULAR | Status: DC
Start: 2016-03-01 — End: 2016-03-02

## 2016-03-12 DIAGNOSIS — Z89511 Acquired absence of right leg below knee: Secondary | ICD-10-CM | POA: Diagnosis not present

## 2016-03-15 ENCOUNTER — Encounter (HOSPITAL_COMMUNITY)
Admission: RE | Admit: 2016-03-15 | Discharge: 2016-03-15 | Disposition: A | Discharge status: Home / Self Care | Payer: Medicare Other | Source: Ambulatory Visit | Attending: Nephrology | Admitting: Nephrology

## 2016-03-15 DIAGNOSIS — D509 Iron deficiency anemia, unspecified: Secondary | ICD-10-CM | POA: Diagnosis not present

## 2016-03-15 DIAGNOSIS — N184 Chronic kidney disease, stage 4 (severe): Secondary | ICD-10-CM

## 2016-03-15 LAB — POCT HEMOGLOBIN-HEMACUE: Hemoglobin: 13.2 g/dL (ref 12.0–15.0)

## 2016-03-15 MED ORDER — EPOETIN ALFA 10000 UNIT/ML IJ SOLN: 10000.0000 [IU] | INTRAMUSCULAR | Status: DC

## 2016-03-18 ENCOUNTER — Encounter: Payer: Self-pay | Admitting: Physical Therapy

## 2016-03-18 ENCOUNTER — Ambulatory Visit: Payer: Medicare Other | Attending: Orthopedic Surgery | Admitting: Physical Therapy

## 2016-03-18 DIAGNOSIS — M6281 Muscle weakness (generalized): Secondary | ICD-10-CM | POA: Insufficient documentation

## 2016-03-18 DIAGNOSIS — R2689 Other abnormalities of gait and mobility: Secondary | ICD-10-CM | POA: Diagnosis present

## 2016-03-18 DIAGNOSIS — R2681 Unsteadiness on feet: Secondary | ICD-10-CM | POA: Diagnosis present

## 2016-03-18 DIAGNOSIS — R29898 Other symptoms and signs involving the musculoskeletal system: Secondary | ICD-10-CM | POA: Diagnosis present

## 2016-03-18 NOTE — Therapy (Signed)
[facility] [address] [place], [place], [address] Phone: [phone]   Fax:  [phone]  Physical Therapy Evaluation  Patient Details  Name: [name] MRN: [id number] Date of Birth: 06/17/1971 Referring Provider: Meridee Score, MD  Encounter Date: 03/18/2016      PT End of Session - 03/18/16 2257    Visit Number 1   Number of Visits 18   Date for PT Re-Evaluation 05/17/16   Authorization Type Medicare G-Code & progress note required   PT Start Time 0845   PT Stop Time 0935   PT Time Calculation (min) 50 min   Equipment Utilized During Treatment Gait belt   Activity Tolerance Patient tolerated treatment well   Behavior During Therapy [facility] for tasks assessed/performed      Past Medical History:  Diagnosis Date  . Anemia   . CHF (congestive heart failure) ([place])   . Critical ischemia of lower extremity hospitalized 10/05/2015   right  . Diabetic neuropathy ([place])   . ESRD (end stage renal disease) on dialysis ([place]) 09/28/2011-01/23/2013   M-W-F; [place]  . GERD (gastroesophageal reflux disease)   . High cholesterol   . History of blood transfusion    related to "kidney transplant"  . Hypertension    sees Dr. Carolin Guernsey  . Metabolic bone disease   . Peripheral vascular disease ([place])   . Pneumonia 09/27/2011  . Retinopathy   . Type II diabetes mellitus ([place])    "controlled with diet"    Past Surgical History:  Procedure Laterality Date  . AMPUTATION Right 10/13/2015   Procedure: Right Transmetatarsal Amputation;  Surgeon: Newt Minion, MD;  Location: [place];  Service: Orthopedics;  Laterality: Right;  . AMPUTATION Right 11/29/2015   Procedure: RIGHT BELOW KNEE AMPUTATION;  Surgeon: Newt Minion, MD;  Location: [place];  Service: Orthopedics;  Laterality: Right;  . AV FISTULA PLACEMENT  10/04/2011   Procedure: INSERTION OF ARTERIOVENOUS (AV) GORE-TEX GRAFT ARM;  Surgeon: Angelia Mould, MD;   Location: [place];  Service: Vascular;  Laterality: Left;  Insertion left upper arm Arteriovenous goretex graft  . AV FISTULA PLACEMENT  10/29/2011   Procedure: ARTERIOVENOUS (AV) FISTULA CREATION;  Surgeon: Angelia Mould, MD;  Location: [facility] OR;  Service: Vascular;  Laterality: Right;  Creation Right Arteriovenous Fistula   . [place] REMOVAL  10/04/2011   Procedure: REMOVAL OF ARTERIOVENOUS GORETEX GRAFT ([place]);  Surgeon: Elam Dutch, MD;  Location: [place];  Service: Vascular;  Laterality: Left;  . CHOLECYSTECTOMY N/A 12/08/2015   Procedure: LAPAROSCOPIC CHOLECYSTECTOMY WITH INTRAOPERATIVE CHOLANGIOGRAM;  Surgeon: Stark Klein, MD;  Location: [place];  Service: General;  Laterality: N/A;  . ESOPHAGOGASTRODUODENOSCOPY N/A 12/24/2012   Procedure: ESOPHAGOGASTRODUODENOSCOPY (EGD);  Surgeon: Milus Banister, MD;  Location: [place];  Service: Endoscopy;  Laterality: N/A;  . FINGER AMPUTATION Right 02/26/2013   "3rd finger"  . INSERTION OF DIALYSIS CATHETER  10/04/2011   Procedure: INSERTION OF DIALYSIS CATHETER;  Surgeon: Angelia Mould, MD;  Location: [place];  Service: Vascular;  Laterality: Right;  insertion of dialysis catheter right internal jugular  . INSERTION OF DIALYSIS CATHETER  06/23/2012   Procedure: INSERTION OF DIALYSIS CATHETER;  Surgeon: Angelia Mould, MD;  Location: [place];  Service: Vascular;  Laterality: N/A;  Ultrasound guided  . KIDNEY TRANSPLANT  01/24/2013  . PATCH ANGIOPLASTY  06/23/2012   Procedure: PATCH ANGIOPLASTY;  Surgeon: Angelia Mould, MD;  Location: [place];  Service:  Vascular;  Laterality: Right;  . PERIPHERAL VASCULAR CATHETERIZATION N/A 09/19/2015   Procedure: Lower Extremity Angiography;  Surgeon: Adrian Prows, MD;  Location: [place] [facility];  Service: Cardiovascular;  Laterality: N/A;  . PERIPHERAL VASCULAR CATHETERIZATION N/A 09/19/2015   Procedure: Abdominal Aortogram;  Surgeon: Adrian Prows, MD;  Location: [place] [facility];  Service: Cardiovascular;   Laterality: N/A;  . PERIPHERAL VASCULAR CATHETERIZATION Right 09/19/2015   Procedure: Peripheral Vascular Intervention;  Surgeon: Adrian Prows, MD;  Location: [place] [facility];  Service: Cardiovascular;  Laterality: Right;  Right Common  Iliac  . PERIPHERAL VASCULAR CATHETERIZATION N/A 10/06/2015   Procedure: Lower Extremity Angiography;  Surgeon: Adrian Prows, MD;  Location: [place] [facility];  Service: Cardiovascular;  Laterality: N/A;  . PERIPHERAL VASCULAR CATHETERIZATION  10/06/2015   Procedure: Peripheral Vascular Intervention;  Surgeon: Adrian Prows, MD;  Location: [place] [facility];  Service: Cardiovascular;;  REIA  Omnilink 6.0x29, innova 7x80  RSFA innova 5x80  . REVISON OF ARTERIOVENOUS FISTULA  06/23/2012   Procedure: REVISON OF ARTERIOVENOUS FISTULA;  Surgeon: Angelia Mould, MD;  Location: [place];  Service: Vascular;  Laterality: Right;  Ultrasound guided  . SHUNTOGRAM N/A 04/06/2012   Procedure: Earney Mallet;  Surgeon: Angelia Mould, MD;  Location: [facility] CATH LAB;  Service: Cardiovascular;  Laterality: N/A;    There were no vitals filed for this visit.       Subjective Assessment - 03/18/16 0855    Subjective This 45yo female underwent a right Metatarsal Amputation on 10/13/2015 due to diabetic wound. She underwent a right Transtibial Amputation 11/29/2015 due to non-healing Transmetatarsal Amputation with hospitalization 11/29/2015-12/05/2015. She was re-admitted 12/06/2015 with anemia and underwent gall bladder removal with hospitalization thru 12/11/2015. She has recieved injections over last 6 weeks for anemia. She recieved her first prosthesis 03/12/2016 and is dependent in use & care.    Patient is accompained by: Family member   Pertinent History IDDM2 with neuropathy & retinopathy, kidney transplant (01/24/2013), respiratory failure 3X (09/27/2011, 10/14/2015 & 12/06/2015) hypoxia, HTN, CHF, PVD, gout (pt denies),    Limitations Lifting;Standing;Walking   Patient Stated Goals She  wants to return to "normal" walking, driving, going out in community, working, going concerts, amusement parks.   Currently in Pain? No/denies            [facility] PT Assessment - 03/18/16 0845      Assessment   Medical Diagnosis Right Transtibial Amputation   Referring Provider Meridee Score, MD   Onset Date/Surgical Date 03/12/16  Prosthesis Delivery   Hand Dominance Left     Precautions   Precautions Fall  no "hitting" right side where kidney is     Restrictions   Weight Bearing Restrictions No     Balance Screen   Has the patient fallen in the past 6 months Yes   How many times? 3-4  trying to hop up stairs, has ramp now   Has the patient had a decrease in activity level because of a fear of falling?  Yes   Is the patient reluctant to leave their home because of a fear of falling?  No     Home Environment   Living Environment Private residence   Living Arrangements Parent   Type of [place] entrance;Stairs to enter  ramp to front (2nd entrance)   Entrance Stairs-Number of Steps 4   Entrance Stairs-Rails Left   Home Layout One level;Laundry or work area in basement  basement,  father lives in basement   Alternate Level Stairs-Number of Steps 14   Alternate Level Stairs-Rails Left   Home Equipment Wheelchair - manual;Transport chair;Tub bench;Grab bars - tub/shower;Walker - 2 wheels;Walker - 4 wheels     Prior Function   Level of Independence Independent;Independent with household mobility without device;Independent with community mobility without device;Independent with gait   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, 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   Leisure concerts, amusement parks,      Observation/Other Assessments   Focus on Therapeutic Outcomes (FOTO)  42.44 Functional Status   Activities of Balance Confidence Scale (ABC Scale)  23.1%   Fear Avoidance Belief Questionnaire (FABQ)  26 (8)      Posture/Postural Control   Posture/Postural Control Postural limitations   Postural Limitations Rounded Shoulders;Forward head;Weight shift left     ROM / Strength   AROM / PROM / Strength AROM;Strength     AROM   Overall AROM  Within functional limits for tasks performed     Strength   Overall Strength Deficits   Overall Strength Comments UE WFL for task performed   Strength Assessment Site Hip;Knee   Right/Left Hip Right;Left   Right Hip Flexion 4/5   Right Hip Extension 3+/5   Right Hip ABduction 4-/5   Left Hip Flexion 4/5   Left Hip Extension 3+/5   Left Hip ABduction 4-/5   Right/Left Knee Right;Left   Right Knee Flexion 4/5   Right Knee Extension 4/5   Left Knee Extension 5/5     Transfers   Transfers Sit to Stand;Stand to Sit   Sit to Stand 5: Supervision;With upper extremity assist;With armrests;From chair/3-in-1  uses back of legs against chair to stabilize   Stand to Sit 5: Supervision;With upper extremity assist;With armrests;To chair/3-in-1  uses back of legs against chair     Ambulation/Gait   Ambulation/Gait Yes   Ambulation/Gait Assistance 5: Supervision   Ambulation/Gait Assistance Details Excessive UE weight bearing   Ambulation Distance (Feet) 150 Feet   Assistive device Prosthesis;Rolling walker   Gait Pattern Step-through pattern;Decreased step length - left;Decreased stance time - right;Decreased weight shift to right;Left circumduction;Antalgic;Trunk flexed;Narrow base of support;Decreased stride length;Decreased hip/knee flexion - right;Poor foot clearance - right   Ambulation Surface Indoor;Level   Gait velocity 1.00 ft/sec     Standardized Balance Assessment   Standardized Balance Assessment Berg Balance Test     Berg Balance Test   Sit to Stand Needs minimal aid to stand or to stabilize   Standing Unsupported Able to stand 2 minutes with supervision   Sitting with Back Unsupported but Feet Supported on Floor or Stool Able to sit safely and  securely 2 minutes   Stand to Sit Controls descent by using hands   Transfers Able to transfer safely, definite need of hands   Standing Unsupported with Eyes Closed Able to stand 10 seconds with supervision   Standing Ubsupported with Feet Together Needs help to attain position but able to stand for 30 seconds with feet together   From Standing, Reach Forward with Outstretched Arm Reaches forward but needs supervision   From Standing Position, Pick up Object from Floor Unable to try/needs assist to keep balance   From Standing Position, Turn to Look Behind Over each Shoulder Needs assist to keep from losing balance and falling   Turn 360 Degrees Needs assistance while turning   Standing Unsupported, Alternately Place Feet on Step/Stool Needs assistance to  keep from falling or unable to try   Standing Unsupported, One Foot in [employer] balance while stepping or standing   Standing on One Leg Unable to try or needs assist to prevent fall   Total Score 19         Prosthetics Assessment - 03/18/16 0845      Prosthetics   Prosthetic Care Dependent with Skin check;Residual limb care;Care of non-amputated limb;Prosthetic cleaning;Ply sock cleaning;Correct ply sock adjustment;Proper wear schedule/adjustment;Proper weight-bearing schedule/adjustment   Donning prosthesis  Supervision   Doffing prosthesis  Supervision   Current prosthetic wear tolerance (days/week)  worn 4 of 6 days since delivery   Current prosthetic wear tolerance (#hours/day)  15 min only 3 days & 3hrs yesterday.   Edema non-pitting   Residual limb condition  No open areas, cylinderical, normal color, moisture, temperature. Scar is not adhered.                   [place] Adult PT Treatment/Exercise - 03/18/16 0845      Bed Mobility   Bed Mobility Supine to Sit     Prosthetics   Education Provided Skin check;Residual limb care;Prosthetic cleaning;Ply sock cleaning;Correct ply sock adjustment;Proper Donning;Proper  Doffing;Proper wear schedule/adjustment;Proper weight-bearing schedule/adjustment  initiate wear 2hrs 2x/day with increase q5 days if no issues   Person(s) Educated Patient;Parent(s)   Education Method Explanation;Demonstration;Tactile cues;Verbal cues   Education Method Verbalized understanding;Returned demonstration;Tactile cues required;Verbal cues required;Needs further instruction                  PT Short Term Goals - 03/18/16 2313      PT SHORT TERM GOAL #1   Title Patient demonstrates proper donning & verbalizes proper care. (Target Date: 04/19/2016)   Time 1   Period Months   Status New     PT SHORT TERM GOAL #2   Title Patient tolerates wear >10hrs total wear/day without skin issues. (Target Date: 04/19/2016)   Time 1   Period Months   Status New     PT SHORT TERM GOAL #3   Title Patient ambulates 300' with RW & prosthesis modified independent. (Target Date: 04/19/2016)   Time 1   Period Months   Status New     PT SHORT TERM GOAL #4   Title Pateint negotiates ramps, curbs & stairs (2 rails) with RW & prosthesis modified independent. (Target Date: 04/19/2016)   Time 1   Period Months     PT SHORT TERM GOAL #5   Title Patient reaches 7" anteriorly & to floor without balance loss with supervision. (Target Date: 04/19/2016)   Time 1   Period Months   Status New           PT Long Term Goals - 03/18/16 2306      PT LONG TERM GOAL #1   Title Patient verbalizes & demonstrates understanding of prosthetic care to enable safe use of prosthesis. (Target Date: 05/17/2016)   Time 2   Period Months   Status New     PT LONG TERM GOAL #2   Title Patient tolerates wear >90% of awake hours without skin integrity issues or limb tenderness to enable function throughout her day. (Target Date: 05/17/2016)   Time 2   Period Months   Status New     PT LONG TERM GOAL #3   Title Berg Balance >/= 45/56 to indicate lower fall risk. (Target Date: 05/17/2016)   Time 2    Period Months   Status  New     PT LONG TERM GOAL #4   Title Patient ambulates 500' with LRAD & prosthesis including grass modified indepedent to enable function in community. (Target Date: 03/17/2016)   Time 2   Period Months   Status New     PT LONG TERM GOAL #5   Title Patient negotiates ramps, curbs & stairs (1 rail) modified independent to enable community access. (Target Date: 05/17/2016)   Time 2   Period Months   Status New               Plan - 22-Mar-2016 September 11, 2257    Clinical Impression Statement This 45yo female underwent a right Transtibial Amputation on 11/29/2015 and was rehospitalization for anemia, acute respiratory failure & gall bladder removal.  She is dependent in prosthetic care which limits safe use of prosthesis. She has limited wear which limits function during her day. Berg Balance of 19/56 indicates high fall risk. Her gait is dependent on excessive UE weight bearing with rolling walker. Her gait deviations & gait velocity of 1.00 ft/sec indicates high fall risk. Her condition is evolving and plan of care is moderate.    Rehab Potential Good   PT Frequency 2x / week   PT Duration Other (comment)  9 weeks (60 days)   PT Treatment/Interventions ADLs/Self Care Home Management;Gait training;Stair training;Functional mobility training;Therapeutic activities;Therapeutic exercise;Balance training;Neuromuscular re-education;Patient/family education;Prosthetic Training   PT Next Visit Plan review prosthetic care, HEP mid-line, gait with RW including barriers   Consulted and Agree with Plan of Care Patient      Patient will benefit from skilled therapeutic intervention in order to improve the following deficits and impairments:  Abnormal gait, Decreased activity tolerance, Decreased balance, Decreased endurance, Decreased knowledge of use of DME, Decreased mobility, Decreased strength, Postural dysfunction, Prosthetic Dependency, Pain  Visit Diagnosis: Other abnormalities  of gait and mobility  Unsteadiness on feet  Muscle weakness (generalized)  Other symptoms and signs involving the musculoskeletal system      G-Codes - 03-22-2016 09-11-17    Functional Assessment Tool Used Patient is dependent in prosthesis use & care. She has worn prosthesis 4 of 6 days since delivery for 15 min 3 days & 3 hrs 1 day.    Functional Limitation Self care   Self Care Current Status [phone]) At least 80 percent but less than 100 percent impaired, limited or restricted   Self Care Goal Status (U9811) At least 1 percent but less than 20 percent impaired, limited or restricted       Problem List Patient Active Problem List   Diagnosis Date Noted  . Transaminitis 12/06/2015  . Acute on chronic renal failure ([place]) 12/06/2015  . History of renal transplant 12/06/2015  . UTI (lower urinary tract infection) 12/06/2015  . Acute cholecystitis 12/06/2015  . Below knee amputation status ([place]) 11/29/2015  . Asystole ([place])   . S/P unilateral BKA (below knee amputation) ([place])   . Pericardial effusion   . Type 2 diabetes mellitus with peripheral neuropathy ([facility])   . Chronic kidney disease, stage IV (severe) ([place])   . Essential hypertension   . Anemia of chronic disease   . Acute blood loss anemia   . Gangrene associated with diabetes mellitus ([place]) 10/13/2015  . Critical lower limb ischemia 10/05/2015  . PAD (peripheral artery disease) ([place]) 09/18/2015  . Leukopenia 08/22/2014  . Protein-calorie malnutrition, severe ([place]) 12/24/2012  . Gastric ulcer 12/24/2012  . Mechanical complication of other vascular device, implant, and graft 09/22/2012  .  End stage renal disease ([place]) 10/23/2011  . Physical deconditioning 10/09/2011  . HTN (hypertension), malignant 09/27/2011  . Chronic kidney disease, stage 4, severely decreased GFR ([facility]) 09/27/2011  . PNA (pneumonia) 09/27/2011  . Acute respiratory failure with hypoxia ([place]) 09/27/2011  . VOMITING 12/06/2009  . GOUT 12/21/2008  .  Uncontrolled type 2 diabetes mellitus with peripheral artery disease ([place]) 08/14/2006  . HYPERLIPIDEMIA 08/14/2006  . OBESITY, NOS 08/14/2006  . FIBROADENOSIS, BREAST 08/14/2006  . Irregular menstrual cycle 08/14/2006    Miria Cappelli PT, DPT 03/18/2016, 11:21 PM  [facility] [address] [place], [place], [address] Phone: [phone]   Fax:  [phone]  Name: [name] MRN: [id number] Date of Birth: 04-24-1971

## 2016-03-19 ENCOUNTER — Ambulatory Visit: Payer: Medicare Other | Admitting: Physical Therapy

## 2016-03-19 ENCOUNTER — Ambulatory Visit (INDEPENDENT_AMBULATORY_CARE_PROVIDER_SITE_OTHER): Payer: Medicare Other | Admitting: Orthopedic Surgery

## 2016-03-19 ENCOUNTER — Encounter: Payer: Self-pay | Admitting: Physical Therapy

## 2016-03-19 DIAGNOSIS — Z89511 Acquired absence of right leg below knee: Secondary | ICD-10-CM

## 2016-03-19 DIAGNOSIS — I70261 Atherosclerosis of native arteries of extremities with gangrene, right leg: Secondary | ICD-10-CM

## 2016-03-19 DIAGNOSIS — R2681 Unsteadiness on feet: Secondary | ICD-10-CM

## 2016-03-19 DIAGNOSIS — M6281 Muscle weakness (generalized): Secondary | ICD-10-CM | POA: Diagnosis not present

## 2016-03-19 DIAGNOSIS — E1142 Type 2 diabetes mellitus with diabetic polyneuropathy: Secondary | ICD-10-CM

## 2016-03-19 DIAGNOSIS — R29898 Other symptoms and signs involving the musculoskeletal system: Secondary | ICD-10-CM

## 2016-03-19 DIAGNOSIS — R2689 Other abnormalities of gait and mobility: Secondary | ICD-10-CM | POA: Diagnosis not present

## 2016-03-19 NOTE — Therapy (Signed)
[facility] [address] [place], [place], [address] Phone: [phone]   Fax:  [phone]  Physical Therapy Treatment  Patient Details  Name: [name] MRN: [id number] Date of Birth: April 14, 1971 Referring Provider: Meridee Score, MD  Encounter Date: 03/19/2016      PT End of Session - 03/19/16 0853    Visit Number 2   Number of Visits 18   Date for PT Re-Evaluation 05/17/16   Authorization Type Medicare G-Code & progress note required   PT Start Time 0848   PT Stop Time 0930   PT Time Calculation (min) 42 min   Equipment Utilized During Treatment Gait belt   Activity Tolerance Patient tolerated treatment well   Behavior During Therapy [facility] for tasks assessed/performed      Past Medical History:  Diagnosis Date  . Anemia   . CHF (congestive heart failure) ([employer] [facility])   . Critical ischemia of lower extremity hospitalized 10/05/2015   right  . Diabetic neuropathy ([place])   . ESRD (end stage renal disease) on dialysis ([place]) 09/28/2011-01/23/2013   M-W-F; [place]  . GERD (gastroesophageal reflux disease)   . High cholesterol   . History of blood transfusion    related to "kidney transplant"  . Hypertension    sees Dr. Carolin Guernsey  . Metabolic bone disease   . Peripheral vascular disease ([place])   . Pneumonia 09/27/2011  . Retinopathy   . Type II diabetes mellitus ([place])    "controlled with diet"    Past Surgical History:  Procedure Laterality Date  . AMPUTATION Right 10/13/2015   Procedure: Right Transmetatarsal Amputation;  Surgeon: Newt Minion, MD;  Location: [facility];  Service: Orthopedics;  Laterality: Right;  . AMPUTATION Right 11/29/2015   Procedure: RIGHT BELOW KNEE AMPUTATION;  Surgeon: Newt Minion, MD;  Location: [facility];  Service: Orthopedics;  Laterality: Right;  . AV FISTULA PLACEMENT  10/04/2011   Procedure: INSERTION OF ARTERIOVENOUS (AV) GORE-TEX GRAFT ARM;  Surgeon: Angelia Mould, MD;   Location: [facility];  Service: Vascular;  Laterality: Left;  Insertion left upper arm Arteriovenous goretex graft  . AV FISTULA PLACEMENT  10/29/2011   Procedure: ARTERIOVENOUS (AV) FISTULA CREATION;  Surgeon: Angelia Mould, MD;  [facility];  Service: Vascular;  Laterality: Right;  Creation Right Arteriovenous Fistula   . [place] REMOVAL  10/04/2011   Procedure: REMOVAL OF ARTERIOVENOUS GORETEX GRAFT ([place]);  Surgeon: Elam Dutch, MD;  Location: [facility];  Service: Vascular;  Laterality: Left;  . CHOLECYSTECTOMY N/A 12/08/2015   Procedure: LAPAROSCOPIC CHOLECYSTECTOMY WITH INTRAOPERATIVE CHOLANGIOGRAM;  Surgeon: Stark Klein, MD;  Location: [facility];  Service: General;  Laterality: N/A;  . ESOPHAGOGASTRODUODENOSCOPY N/A 12/24/2012   Procedure: ESOPHAGOGASTRODUODENOSCOPY (EGD);  Surgeon: Milus Banister, MD;  Location: [facility];  Service: Endoscopy;  Laterality: N/A;  . FINGER AMPUTATION Right 02/26/2013   "3rd finger"  . INSERTION OF DIALYSIS CATHETER  10/04/2011   Procedure: INSERTION OF DIALYSIS CATHETER;  Surgeon: Angelia Mould, MD;  Location: [facility];  Service: Vascular;  Laterality: Right;  insertion of dialysis catheter right internal jugular  . INSERTION OF DIALYSIS CATHETER  06/23/2012   Procedure: INSERTION OF DIALYSIS CATHETER;  Surgeon: Angelia Mould, MD;  Location: [facility];  Service: Vascular;  Laterality: N/A;  Ultrasound guided  . KIDNEY TRANSPLANT  01/24/2013  . PATCH ANGIOPLASTY  06/23/2012   Procedure: PATCH ANGIOPLASTY;  Surgeon: Angelia Mould, MD;  Location: [facility];  Service:  Vascular;  Laterality: Right;  . PERIPHERAL VASCULAR CATHETERIZATION N/A 09/19/2015   Procedure: Lower Extremity Angiography;  Surgeon: Adrian Prows, MD;  Location: [facility] CV LAB;  Service: Cardiovascular;  Laterality: N/A;  . PERIPHERAL VASCULAR CATHETERIZATION N/A 09/19/2015   Procedure: Abdominal Aortogram;  Surgeon: Adrian Prows, MD;  Location: [place] CV LAB;  Service: Cardiovascular;   Laterality: N/A;  . PERIPHERAL VASCULAR CATHETERIZATION Right 09/19/2015   Procedure: Peripheral Vascular Intervention;  Surgeon: Adrian Prows, MD;  Location: [facility] CV LAB;  Service: Cardiovascular;  Laterality: Right;  Right Common  Iliac  . PERIPHERAL VASCULAR CATHETERIZATION N/A 10/06/2015   Procedure: Lower Extremity Angiography;  Surgeon: Adrian Prows, MD;  Location: [facility] CV LAB;  Service: Cardiovascular;  Laterality: N/A;  . PERIPHERAL VASCULAR CATHETERIZATION  10/06/2015   Procedure: Peripheral Vascular Intervention;  Surgeon: Adrian Prows, MD;  Location: [facility] CV LAB;  Service: Cardiovascular;;  REIA  Omnilink 6.0x29, innova 7x80  RSFA innova 5x80  . REVISON OF ARTERIOVENOUS FISTULA  06/23/2012   Procedure: REVISON OF ARTERIOVENOUS FISTULA;  Surgeon: Angelia Mould, MD;  Location: [facility];  Service: Vascular;  Laterality: Right;  Ultrasound guided  . SHUNTOGRAM N/A 04/06/2012   Procedure: [place]ney Mallet;  Surgeon: Angelia Mould, MD;  Location: [facility] CATH LAB;  Service: Cardiovascular;  Laterality: N/A;    There were no vitals filed for this visit.      Subjective Assessment - 03/19/16 0851    Subjective No new complaints. Wore prosthesis to chuch concert last night and "I got a little happy", was standing and "jumping" without holding onto anything. Did not fall. No pain at all today.   Pertinent History IDDM2 with neuropathy & retinopathy, kidney transplant (01/24/2013), respiratory failure 3X (09/27/2011, 10/14/2015 & 12/06/2015) hypoxia, HTN, CHF, PVD, gout (pt denies),    Limitations Lifting;Standing;Walking   Patient Stated Goals She wants to return to "normal" walking, driving, going out in community, working, going concerts, amusement parks.   Currently in Pain? No/denies            [facility] Adult PT Treatment/Exercise - 03/19/16 0853      Transfers   Transfers Sit to Stand;Stand to Sit   Sit to Stand 5: Supervision;With upper extremity assist;With armrests;From  chair/3-in-1   Sit to Stand Details Verbal cues for sequencing;Verbal cues for precautions/safety;Verbal cues for safe use of DME/AE   Sit to Stand Details (indicate cue type and reason) cues to scoot to edge of seat to assist with standing up, cues for hand placement for safety   Stand to Sit 5: Supervision;With upper extremity assist;With armrests;To chair/3-in-1   Stand to Sit Details (indicate cue type and reason) Verbal cues for precautions/safety;Verbal cues for safe use of DME/AE   Stand to Sit Details cues to reach back after fully aligning self with chair before sitting down     Ambulation/Gait   Ambulation/Gait Yes   Ambulation/Gait Assistance 5: Supervision   Ambulation/Gait Assistance Details cues on posture, step length and for weight shifting onto prosthesis with gait.    Ambulation Distance (Feet) 100 Feet  x2   Assistive device Prosthesis;Rolling walker   Gait Pattern Step-through pattern;Decreased step length - left;Decreased stance time - right;Decreased weight shift to right;Left circumduction;Antalgic;Trunk flexed;Narrow base of support;Decreased stride length;Decreased hip/knee flexion - right;Poor foot clearance - right   Ambulation Surface Level;Indoor     Prosthetics   Prosthetic Care Comments  prosthesis noted to be rotating with swing phase of gait. sock ply increased by  1 ply today with education to pt on the 3 ways to know when sock ply needs to be adjusted.   Current prosthetic wear tolerance (days/week)  daily   Current prosthetic wear tolerance (#hours/day)  2 hours, 2 x day starting 03/18/16   Residual limb condition  intact, no issues noted   Education Provided Residual limb care;Proper Donning;Proper wear schedule/adjustment;Proper weight-bearing schedule/adjustment;Correct ply sock adjustment   Person(s) Educated Patient   Education Method Explanation;Demonstration;Verbal cues   Education Method Verbalized understanding;Verbal cues required;Needs further  instruction   Donning Prosthesis Supervision   Doffing Prosthesis Supervision            PT Education - 03/19/16 [phone]    Education provided Yes   Education Details HEP at sink for proprioception and balance   Person(s) Educated Patient   Methods Explanation;Demonstration;Verbal cues;Handout   Comprehension Verbalized understanding;Returned demonstration;Verbal cues required;Need further instruction          PT Short Term Goals - 03/18/16 2313      PT SHORT TERM GOAL #1   Title Patient demonstrates proper donning & verbalizes proper care. (Target Date: 04/19/2016)   Time 1   Period Months   Status New     PT SHORT TERM GOAL #2   Title Patient tolerates wear >10hrs total wear/day without skin issues. (Target Date: 04/19/2016)   Time 1   Period Months   Status New     PT SHORT TERM GOAL #3   Title Patient ambulates 300' with RW & prosthesis modified independent. (Target Date: 04/19/2016)   Time 1   Period Months   Status New     PT SHORT TERM GOAL #4   Title Pateint negotiates ramps, curbs & stairs (2 rails) with RW & prosthesis modified independent. (Target Date: 04/19/2016)   Time 1   Period Months     PT SHORT TERM GOAL #5   Title Patient reaches 7" anteriorly & to floor without balance loss with supervision. (Target Date: 04/19/2016)   Time 1   Period Months   Status New           PT Long Term Goals - 03/18/16 2306      PT LONG TERM GOAL #1   Title Patient verbalizes & demonstrates understanding of prosthetic care to enable safe use of prosthesis. (Target Date: 05/17/2016)   Time 2   Period Months   Status New     PT LONG TERM GOAL #2   Title Patient tolerates wear >90% of awake hours without skin integrity issues or limb tenderness to enable function throughout her day. (Target Date: 05/17/2016)   Time 2   Period Months   Status New     PT LONG TERM GOAL #3   Title Berg Balance >/= 45/56 to indicate lower fall risk. (Target Date: 05/17/2016)   Time 2    Period Months   Status New     PT LONG TERM GOAL #4   Title Patient ambulates 500' with LRAD & prosthesis including grass modified indepedent to enable function in community. (Target Date: 03/17/2016)   Time 2   Period Months   Status New     PT LONG TERM GOAL #5   Title Patient negotiates ramps, curbs & stairs (1 rail) modified independent to enable community access. (Target Date: 05/17/2016)   Time 2   Period Months   Status New           Plan - 03/19/16 0853    Clinical Impression  Statement Today's skilled session continued to address prosthetic management and gait. Also educated pt on sink HEP for proprioception and balance without any issues reported. Pt continued to demo need of excessive UE support with gait and standing and decreased weight shifting onto prosthesis with mobility. Pt is making steady progress toward goals and should benefit from continued PT to progress toward unmet goals.                           Rehab Potential Good   PT Frequency 2x / week   PT Duration Other (comment)  9 weeks (60 days)   PT Treatment/Interventions ADLs/Self Care Home Management;Gait training;Stair training;Functional mobility training;Therapeutic activities;Therapeutic exercise;Balance training;Neuromuscular re-education;Patient/family education;Prosthetic Training   PT Next Visit Plan review prosthetic care,  gait with RW working on correcting base of support/increasing weight shifting onto prosthesis, initiate barriers   Consulted and Agree with Plan of Care Patient      Patient will benefit from skilled therapeutic intervention in order to improve the following deficits and impairments:  Abnormal gait, Decreased activity tolerance, Decreased balance, Decreased endurance, Decreased knowledge of use of DME, Decreased mobility, Decreased strength, Postural dysfunction, Prosthetic Dependency, Pain  Visit Diagnosis: Other abnormalities of gait and mobility  Unsteadiness on  feet  Muscle weakness (generalized)  Other symptoms and signs involving the musculoskeletal system     Problem List Patient Active Problem List   Diagnosis Date Noted  . Transaminitis 12/06/2015  . Acute on chronic renal failure ([place]) 12/06/2015  . History of renal transplant 12/06/2015  . UTI (lower urinary tract infection) 12/06/2015  . Acute cholecystitis 12/06/2015  . Below knee amputation status ([place]) 11/29/2015  . Asystole ([place])   . S/P unilateral BKA (below knee amputation) ([place])   . Pericardial effusion   . Type 2 diabetes mellitus with peripheral neuropathy ([facility])   . Chronic kidney disease, stage IV (severe) ([place])   . Essential hypertension   . Anemia of chronic disease   . Acute blood loss anemia   . Gangrene associated with diabetes mellitus ([place]) 10/13/2015  . Critical lower limb ischemia 10/05/2015  . PAD (peripheral artery disease) ([place]) 09/18/2015  . Leukopenia 08/22/2014  . Protein-calorie malnutrition, severe ([place]) 12/24/2012  . Gastric ulcer 12/24/2012  . Mechanical complication of other vascular device, implant, and graft 09/22/2012  . End stage renal disease ([place]) 10/23/2011  . Physical deconditioning 10/09/2011  . HTN (hypertension), malignant 09/27/2011  . Chronic kidney disease, stage 4, severely decreased GFR ([facility]) 09/27/2011  . PNA (pneumonia) 09/27/2011  . Acute respiratory failure with hypoxia ([place]) 09/27/2011  . VOMITING 12/06/2009  . GOUT 12/21/2008  . Uncontrolled type 2 diabetes mellitus with peripheral artery disease ([place]) 08/14/2006  . HYPERLIPIDEMIA 08/14/2006  . OBESITY, NOS 08/14/2006  . FIBROADENOSIS, BREAST 08/14/2006  . Irregular menstrual cycle 08/14/2006    Willow Ora, PTA, [facility] Outpatient Neuro [facility] [address] [place], [place] [address] [phone] 03/19/16, 9:55 AM   Name: JULIZA MACHNIK MRN: [id number] Date of Birth: October 11, 1970

## 2016-03-19 NOTE — Patient Instructions (Signed)

## 2016-03-27 ENCOUNTER — Encounter: Payer: Self-pay | Admitting: Physical Therapy

## 2016-03-27 ENCOUNTER — Ambulatory Visit: Payer: Medicare Other | Admitting: Physical Therapy

## 2016-03-27 DIAGNOSIS — M6281 Muscle weakness (generalized): Secondary | ICD-10-CM

## 2016-03-27 DIAGNOSIS — R29898 Other symptoms and signs involving the musculoskeletal system: Secondary | ICD-10-CM

## 2016-03-27 DIAGNOSIS — R2681 Unsteadiness on feet: Secondary | ICD-10-CM | POA: Diagnosis not present

## 2016-03-27 DIAGNOSIS — R2689 Other abnormalities of gait and mobility: Secondary | ICD-10-CM | POA: Diagnosis not present

## 2016-03-27 NOTE — Therapy (Signed)
[facility] [address] [place], [place], [address] Phone: [phone]   Fax:  [phone]  Physical Therapy Treatment  Patient Details  Name: [name] MRN: [id number] Date of Birth: [birth year] Referring Provider: Meridee Score, MD  Encounter Date: 03/27/2016      PT End of Session - 03/27/16 0813    Visit Number 3   Number of Visits 18   Date for PT Re-Evaluation 05/17/16   Authorization Type Medicare G-Code & progress note required   PT Start Time 0805   PT Stop Time 0845   PT Time Calculation (min) 40 min   Equipment Utilized During Treatment Gait belt   Activity Tolerance Patient tolerated treatment well   Behavior During Therapy [facility] for tasks assessed/performed      Past Medical History:  Diagnosis Date  . Anemia   . CHF (congestive heart failure) ([place])   . Critical ischemia of lower extremity hospitalized 10/05/2015   right  . Diabetic neuropathy ([place])   . ESRD (end stage renal disease) on dialysis ([place]) 09/28/2011-01/23/2013   M-W-F; [facility]  . GERD (gastroesophageal reflux disease)   . High cholesterol   . History of blood transfusion    related to "kidney transplant"  . Hypertension    sees Dr. Carolin Guernsey  . Metabolic bone disease   . Peripheral vascular disease ([place])   . Pneumonia 09/27/2011  . Retinopathy   . Type II diabetes mellitus ([place])    "controlled with diet"    Past Surgical History:  Procedure Laterality Date  . AMPUTATION Right 10/13/2015   Procedure: Right Transmetatarsal Amputation;  Surgeon: Newt Minion, MD;  Location: [facility];  Service: Orthopedics;  Laterality: Right;  . AMPUTATION Right 11/29/2015   Procedure: RIGHT BELOW KNEE AMPUTATION;  Surgeon: Newt Minion, MD;  Location: [place];  Service: Orthopedics;  Laterality: Right;  . AV FISTULA PLACEMENT  10/04/2011   Procedure: INSERTION OF ARTERIOVENOUS (AV) GORE-TEX GRAFT ARM;  Surgeon: Angelia Mould, MD;   Location: [facility];  Service: Vascular;  Laterality: Left;  Insertion left upper arm Arteriovenous goretex graft  . AV FISTULA PLACEMENT  10/29/2011   Procedure: ARTERIOVENOUS (AV) FISTULA CREATION;  Surgeon: Angelia Mould, MD;  [facility];  Service: Vascular;  Laterality: Right;  Creation Right Arteriovenous Fistula   . [place] REMOVAL  10/04/2011   Procedure: REMOVAL OF ARTERIOVENOUS GORETEX GRAFT ([place]);  Surgeon: Elam Dutch, MD;  Location: [employer];  Service: Vascular;  Laterality: Left;  . CHOLECYSTECTOMY N/A 12/08/2015   Procedure: LAPAROSCOPIC CHOLECYSTECTOMY WITH INTRAOPERATIVE CHOLANGIOGRAM;  Surgeon: Stark Klein, MD;  Location: [place];  Service: General;  Laterality: N/A;  . ESOPHAGOGASTRODUODENOSCOPY N/A 12/24/2012   Procedure: ESOPHAGOGASTRODUODENOSCOPY (EGD);  Surgeon: Milus Banister, MD;  Location: [place];  Service: Endoscopy;  Laterality: N/A;  . FINGER AMPUTATION Right 02/26/2013   "3rd finger"  . INSERTION OF DIALYSIS CATHETER  10/04/2011   Procedure: INSERTION OF DIALYSIS CATHETER;  Surgeon: Angelia Mould, MD;  Location: [facility];  Service: Vascular;  Laterality: Right;  insertion of dialysis catheter right internal jugular  . INSERTION OF DIALYSIS CATHETER  06/23/2012   Procedure: INSERTION OF DIALYSIS CATHETER;  Surgeon: Angelia Mould, MD;  Location: [facility];  Service: Vascular;  Laterality: N/A;  Ultrasound guided  . KIDNEY TRANSPLANT  01/24/2013  . PATCH ANGIOPLASTY  06/23/2012   Procedure: PATCH ANGIOPLASTY;  Surgeon: Angelia Mould, MD;  Location: [place];  Service:  Vascular;  Laterality: Right;  . PERIPHERAL VASCULAR CATHETERIZATION N/A 09/19/2015   Procedure: Lower Extremity Angiography;  Surgeon: Adrian Prows, MD;  Location: [facility] CV [facility];  Service: Cardiovascular;  Laterality: N/A;  . PERIPHERAL VASCULAR CATHETERIZATION N/A 09/19/2015   Procedure: Abdominal Aortogram;  Surgeon: Adrian Prows, MD;  Location: [facility] CV LAB;  Service: Cardiovascular;   Laterality: N/A;  . PERIPHERAL VASCULAR CATHETERIZATION Right 09/19/2015   Procedure: Peripheral Vascular Intervention;  Surgeon: Adrian Prows, MD;  Location: [facility] CV LAB;  Service: Cardiovascular;  Laterality: Right;  Right Common  Iliac  . PERIPHERAL VASCULAR CATHETERIZATION N/A 10/06/2015   Procedure: Lower Extremity Angiography;  Surgeon: Adrian Prows, MD;  Location: [facility] CV LAB;  Service: Cardiovascular;  Laterality: N/A;  . PERIPHERAL VASCULAR CATHETERIZATION  10/06/2015   Procedure: Peripheral Vascular Intervention;  Surgeon: Adrian Prows, MD;  Location: [facility] CV LAB;  Service: Cardiovascular;;  REIA  Omnilink 6.0x29, innova 7x80  RSFA innova 5x80  . REVISON OF ARTERIOVENOUS FISTULA  06/23/2012   Procedure: REVISON OF ARTERIOVENOUS FISTULA;  Surgeon: Angelia Mould, MD;  Location: [facility];  Service: Vascular;  Laterality: Right;  Ultrasound guided  . SHUNTOGRAM N/A 04/06/2012   Procedure: Earney Mallet;  Surgeon: Angelia Mould, MD;  Location: [facility] CATH LAB;  Service: Cardiovascular;  Laterality: N/A;    There were no vitals filed for this visit.      Subjective Assessment - 03/27/16 0810    Subjective No new complaints. Has noticed the phantom pains "are different than with my finger". No falls to report. Phantom pains come and go, mostly after removing prothesis and they short lived in duration "couple of seconds of vibratation/throbbing"   Pertinent History IDDM2 with neuropathy & retinopathy, kidney transplant (01/24/2013), respiratory failure 3X (09/27/2011, 10/14/2015 & 12/06/2015) hypoxia, HTN, CHF, PVD, gout (pt denies),    Limitations Lifting;Standing;Walking   Currently in Pain? No/denies            [facility] Adult PT Treatment/Exercise - 03/27/16 0814      Transfers   Transfers --   Sit to Stand 5: Supervision;With upper extremity assist;With armrests;From chair/3-in-1   Sit to Stand Details Verbal cues for sequencing;Verbal cues for technique;Verbal cues for  precautions/safety;Verbal cues for safe use of DME/AE   Sit to Stand Details (indicate cue type and reason) cues to scoot forward and for hand/prosthetic placement with standing   Stand to Sit 5: Supervision;With upper extremity assist;With armrests;To chair/3-in-1   Stand to Sit Details (indicate cue type and reason) Verbal cues for sequencing;Verbal cues for technique;Verbal cues for precautions/safety;Verbal cues for safe use of DME/AE   Stand to Sit Details cues to turn completly to surface and reach back to sit down. cues to use prosthesis as well with controlling sit down (vs keeping it place forward)     Ambulation/Gait   Ambulation/Gait Yes   Ambulation/Gait Assistance 5: Supervision   Ambulation/Gait Assistance Details multimodal cues on prosthetic placement, base of support, weight shifting onto stance leg and for equal step lenght with gait to decrease gait deviations and normalize pt's base of support. used tile line between blue/white tiles to assist with correct bil LE placement for normalized base of support as pt tends to keep prosthesis abducted out and left leg centered under her. Pt with improvement in these gait deviations after instruction/cues in session.    Ambulation Distance (Feet) 70 Feet  x1, 150 x1   Assistive device Prosthesis;Rolling walker   Gait Pattern Step-through pattern;Decreased  step length - left;Decreased stance time - right;Decreased weight shift to right;Left circumduction;Antalgic;Trunk flexed;Narrow base of support;Decreased stride length;Decreased hip/knee flexion - right;Poor foot clearance - right   Ambulation Surface Level;Indoor   Stairs Yes   Stairs Assistance 4: Min guard   Stairs Assistance Details (indicate cue type and reason) cues on correct sequencing, technique and weight shifting/hand advancement on rails. no loss of balance noted.   Stair Management Technique Two rails;Step to pattern;Forwards   Ramp Other (comment);4: Min assist;5:  Supervision  min guard assist with RW/prosthesis   Ramp Details (indicate cue type and reason) demo'd technique prior to pt performance, minimal reminder cues needed during pt performance on technique and cues on posture, step length needed.   Curb Other (comment)  min guard assist with RW/prosthesis   Curb Details (indicate cue type and reason) cues needed on sequenicng, stance position with walker advancement and once steppping up on curb for improved balance     Prosthetics   Prosthetic Care Comments  pt to increase to 3 hours 2x day, Pt does wear prosthesis up to 6-7 hours straight on Sundays due to church followed by lunch/shopping with family/friends. discussed she could remove the prosthesis in the car as she is not walking/still using wheelchair so to get that break in the middle of the day.                        Current prosthetic wear tolerance (days/week)  daily   Current prosthetic wear tolerance (#hours/day)  2 hours, 2 x day starting 03/18/16   Residual limb condition  intact, no issues noted   Donning Prosthesis Supervision   Doffing Prosthesis Supervision            PT Short Term Goals - 03/18/16 2313      PT SHORT TERM GOAL #1   Title Patient demonstrates proper donning & verbalizes proper care. (Target Date: 04/19/2016)   Time 1   Period Months   Status New     PT SHORT TERM GOAL #2   Title Patient tolerates wear >10hrs total wear/day without skin issues. (Target Date: 04/19/2016)   Time 1   Period Months   Status New     PT SHORT TERM GOAL #3   Title Patient ambulates 300' with RW & prosthesis modified independent. (Target Date: 04/19/2016)   Time 1   Period Months   Status New     PT SHORT TERM GOAL #4   Title Pateint negotiates ramps, curbs & stairs (2 rails) with RW & prosthesis modified independent. (Target Date: 04/19/2016)   Time 1   Period Months     PT SHORT TERM GOAL #5   Title Patient reaches 7" anteriorly & to floor without balance loss with  supervision. (Target Date: 04/19/2016)   Time 1   Period Months   Status New           PT Long Term Goals - 03/18/16 2306      PT LONG TERM GOAL #1   Title Patient verbalizes & demonstrates understanding of prosthetic care to enable safe use of prosthesis. (Target Date: 05/17/2016)   Time 2   Period Months   Status New     PT LONG TERM GOAL #2   Title Patient tolerates wear >90% of awake hours without skin integrity issues or limb tenderness to enable function throughout her day. (Target Date: 05/17/2016)   Time 2   Period Months   Status  New     PT LONG TERM GOAL #3   Title Berg Balance >/= 45/56 to indicate lower fall risk. (Target Date: 05/17/2016)   Time 2   Period Months   Status New     PT LONG TERM GOAL #4   Title Patient ambulates 500' with LRAD & prosthesis including grass modified indepedent to enable function in community. (Target Date: 03/17/2016)   Time 2   Period Months   Status New     PT LONG TERM GOAL #5   Title Patient negotiates ramps, curbs & stairs (1 rail) modified independent to enable community access. (Target Date: 05/17/2016)   Time 2   Period Months   Status New            Plan - 03/27/16 0813    Clinical Impression Statement today's skilled session continued to address gait with prothesis and RW. Initiated barriers today with good in session carryover on technique. Once gait pattern, base of support normalized and pt increased lateral weight shifting she was noted to have a lateral lean of her pylon. Has an appt already with Fritz Pickerel (prosthetist) next Monday 04/01/16. Will continued to monitor with her next session this week and then give pt a list of suggested modifications needed at that appt for proper alignment and fit. Pt also educated on sock ply managment this session and will need continued education/cues on this concept. Pt is making steady progress toward goals and should benefit from continued PT to progress toward unmet goals.   Rehab  Potential Good   PT Frequency 2x / week   PT Duration Other (comment)  9 weeks (60 days)   PT Treatment/Interventions ADLs/Self Care Home Management;Gait training;Stair training;Functional mobility training;Therapeutic activities;Therapeutic exercise;Balance training;Neuromuscular re-education;Patient/family education;Prosthetic Training   PT Next Visit Plan continued gait/barriers with RW/prosthesis, prosthetic management   Consulted and Agree with Plan of Care Patient      Patient will benefit from skilled therapeutic intervention in order to improve the following deficits and impairments:  Abnormal gait, Decreased activity tolerance, Decreased balance, Decreased endurance, Decreased knowledge of use of DME, Decreased mobility, Decreased strength, Postural dysfunction, Prosthetic Dependency, Pain  Visit Diagnosis: Other abnormalities of gait and mobility  Unsteadiness on feet  Muscle weakness (generalized)  Other symptoms and signs involving the musculoskeletal system     Problem List Patient Active Problem List   Diagnosis Date Noted  . Transaminitis 12/06/2015  . Acute on chronic renal failure ([place]) 12/06/2015  . History of renal transplant 12/06/2015  . UTI (lower urinary tract infection) 12/06/2015  . Acute cholecystitis 12/06/2015  . Below knee amputation status ([place]) 11/29/2015  . Asystole ([place])   . S/P unilateral BKA (below knee amputation) ([place])   . Pericardial effusion   . Type 2 diabetes mellitus with peripheral neuropathy ([facility])   . Chronic kidney disease, stage IV (severe) ([place])   . Essential hypertension   . Anemia of chronic disease   . Acute blood loss anemia   . Gangrene associated with diabetes mellitus ([place]) 10/13/2015  . Critical lower limb ischemia 10/05/2015  . PAD (peripheral artery disease) ([place]) 09/18/2015  . Leukopenia 08/22/2014  . Protein-calorie malnutrition, severe ([place]) 12/24/2012  . Gastric ulcer 12/24/2012  . Mechanical complication of  other vascular device, implant, and graft 09/22/2012  . End stage renal disease ([place]) 10/23/2011  . Physical deconditioning 10/09/2011  . HTN (hypertension), malignant 09/27/2011  . Chronic kidney disease, stage 4, severely decreased GFR ([facility]) 09/27/2011  . PNA (pneumonia)  09/27/2011  . Acute respiratory failure with hypoxia ([place]) 09/27/2011  . VOMITING 12/06/2009  . GOUT 12/21/2008  . Uncontrolled type 2 diabetes mellitus with peripheral artery disease ([place]) 08/14/2006  . HYPERLIPIDEMIA 08/14/2006  . OBESITY, NOS 08/14/2006  . FIBROADENOSIS, BREAST 08/14/2006  . Irregular menstrual cycle 08/14/2006    Willow Ora, PTA, [facility] [address], [place], [place] [address] [phone] 03/27/16, 10:50 AM   Name: MADYSEN FAIRCLOTH MRN: [id number] Date of Birth: [birth year]

## 2016-03-29 ENCOUNTER — Ambulatory Visit: Payer: Medicare Other | Admitting: Physical Therapy

## 2016-03-29 ENCOUNTER — Encounter (HOSPITAL_COMMUNITY)
Admission: RE | Admit: 2016-03-29 | Discharge: 2016-03-29 | Disposition: A | Discharge status: Home / Self Care | Payer: Medicare Other | Source: Ambulatory Visit | Attending: Nephrology | Admitting: Nephrology

## 2016-03-29 DIAGNOSIS — M6281 Muscle weakness (generalized): Secondary | ICD-10-CM

## 2016-03-29 DIAGNOSIS — R29898 Other symptoms and signs involving the musculoskeletal system: Secondary | ICD-10-CM

## 2016-03-29 DIAGNOSIS — R2689 Other abnormalities of gait and mobility: Secondary | ICD-10-CM | POA: Diagnosis not present

## 2016-03-29 DIAGNOSIS — N184 Chronic kidney disease, stage 4 (severe): Secondary | ICD-10-CM

## 2016-03-29 DIAGNOSIS — D509 Iron deficiency anemia, unspecified: Secondary | ICD-10-CM | POA: Diagnosis not present

## 2016-03-29 DIAGNOSIS — R2681 Unsteadiness on feet: Secondary | ICD-10-CM

## 2016-03-29 LAB — IRON AND TIBC
Iron: 153 ug/dL (ref 28–170)
Saturation Ratios: 50 % — ABNORMAL HIGH (ref 10.4–31.8)
TIBC: 305 ug/dL (ref 250–450)
UIBC: 152 ug/dL

## 2016-03-29 LAB — FERRITIN: Ferritin: 1313 ng/mL — ABNORMAL HIGH (ref 11–307)

## 2016-03-29 LAB — POCT HEMOGLOBIN-HEMACUE: Hemoglobin: 12.9 g/dL (ref 12.0–15.0)

## 2016-03-29 MED ORDER — EPOETIN ALFA 10000 UNIT/ML IJ SOLN: 10000.0000 [IU] | INTRAMUSCULAR | Status: DC

## 2016-04-01 NOTE — Therapy (Signed)
[facility] [address] [place], [place], [address] Phone: [phone]   Fax:  [phone]  Physical Therapy Treatment  Patient Details  Name: [name] MRN: [id number] Date of Birth: Dec 27, 1970 Referring Provider: Meridee Score, MD  Encounter Date: 03/29/2016   03/29/16 1324  PT Visits / Re-Eval  Visit Number 4  Number of Visits 18  Date for PT Re-Evaluation 05/17/16  Authorization  Authorization Type Medicare G-Code & progress note required  PT Time Calculation  PT Start Time 1318  PT Stop Time 1400  PT Time Calculation (min) 42 min  PT - End of Session  Equipment Utilized During Treatment Gait belt  Activity Tolerance Patient tolerated treatment well  Behavior During Therapy [facility] for tasks assessed/performed     Past Medical History:  Diagnosis Date  . Anemia   . CHF (congestive heart failure) ([place])   . Critical ischemia of lower extremity hospitalized 10/05/2015   right  . Diabetic neuropathy ([place])   . ESRD (end stage renal disease) on dialysis ([place]) 09/28/2011-01/23/2013   M-W-F; [place]  . GERD (gastroesophageal reflux disease)   . High cholesterol   . History of blood transfusion    related to "kidney transplant"  . Hypertension    sees Dr. Carolin Guernsey  . Metabolic bone disease   . Peripheral vascular disease ([place])   . Pneumonia 09/27/2011  . Retinopathy   . Type II diabetes mellitus ([place])    "controlled with diet"    Past Surgical History:  Procedure Laterality Date  . AMPUTATION Right 10/13/2015   Procedure: Right Transmetatarsal Amputation;  Surgeon: Newt Minion, MD;  Location: [place];  Service: Orthopedics;  Laterality: Right;  . AMPUTATION Right 11/29/2015   Procedure: RIGHT BELOW KNEE AMPUTATION;  Surgeon: Newt Minion, MD;  Location: [place];  Service: Orthopedics;  Laterality: Right;  . AV FISTULA PLACEMENT  10/04/2011   Procedure: INSERTION OF ARTERIOVENOUS (AV) GORE-TEX GRAFT  ARM;  Surgeon: Angelia Mould, MD;  Location: [place];  Service: Vascular;  Laterality: Left;  Insertion left upper arm Arteriovenous goretex graft  . AV FISTULA PLACEMENT  10/29/2011   Procedure: ARTERIOVENOUS (AV) FISTULA CREATION;  Surgeon: Angelia Mould, MD;  Location: [facility] OR;  Service: Vascular;  Laterality: Right;  Creation Right Arteriovenous Fistula   . [place] REMOVAL  10/04/2011   Procedure: REMOVAL OF ARTERIOVENOUS GORETEX GRAFT ([place]);  Surgeon: Elam Dutch, MD;  Location: [place];  Service: Vascular;  Laterality: Left;  . CHOLECYSTECTOMY N/A 12/08/2015   Procedure: LAPAROSCOPIC CHOLECYSTECTOMY WITH INTRAOPERATIVE CHOLANGIOGRAM;  Surgeon: Stark Klein, MD;  Location: [place];  Service: General;  Laterality: N/A;  . ESOPHAGOGASTRODUODENOSCOPY N/A 12/24/2012   Procedure: ESOPHAGOGASTRODUODENOSCOPY (EGD);  Surgeon: Milus Banister, MD;  Location: [place];  Service: Endoscopy;  Laterality: N/A;  . FINGER AMPUTATION Right 02/26/2013   "3rd finger"  . INSERTION OF DIALYSIS CATHETER  10/04/2011   Procedure: INSERTION OF DIALYSIS CATHETER;  Surgeon: Angelia Mould, MD;  Location: [place];  Service: Vascular;  Laterality: Right;  insertion of dialysis catheter right internal jugular  . INSERTION OF DIALYSIS CATHETER  06/23/2012   Procedure: INSERTION OF DIALYSIS CATHETER;  Surgeon: Angelia Mould, MD;  Location: [place];  Service: Vascular;  Laterality: N/A;  Ultrasound guided  . KIDNEY TRANSPLANT  01/24/2013  . PATCH ANGIOPLASTY  06/23/2012   Procedure: PATCH ANGIOPLASTY;  Surgeon: Angelia Mould, MD;  Location: [place];  Service: Vascular;  Laterality:  Right;  Marland Kitchen PERIPHERAL VASCULAR CATHETERIZATION N/A 09/19/2015   Procedure: Lower Extremity Angiography;  Surgeon: Adrian Prows, MD;  Location: [place] CV LAB;  Service: Cardiovascular;  Laterality: N/A;  . PERIPHERAL VASCULAR CATHETERIZATION N/A 09/19/2015   Procedure: Abdominal Aortogram;  Surgeon: Adrian Prows, MD;  Location: [place] CV LAB;  Service: Cardiovascular;  Laterality: N/A;  . PERIPHERAL VASCULAR CATHETERIZATION Right 09/19/2015   Procedure: Peripheral Vascular Intervention;  Surgeon: Adrian Prows, MD;  Location: [place] CV LAB;  Service: Cardiovascular;  Laterality: Right;  Right Common  Iliac  . PERIPHERAL VASCULAR CATHETERIZATION N/A 10/06/2015   Procedure: Lower Extremity Angiography;  Surgeon: Adrian Prows, MD;  Location: [place] CV LAB;  Service: Cardiovascular;  Laterality: N/A;  . PERIPHERAL VASCULAR CATHETERIZATION  10/06/2015   Procedure: Peripheral Vascular Intervention;  Surgeon: Adrian Prows, MD;  Location: [place] CV LAB;  Service: Cardiovascular;;  REIA  Omnilink 6.0x29, innova 7x80  RSFA innova 5x80  . REVISON OF ARTERIOVENOUS FISTULA  06/23/2012   Procedure: REVISON OF ARTERIOVENOUS FISTULA;  Surgeon: Angelia Mould, MD;  Location: [place];  Service: Vascular;  Laterality: Right;  Ultrasound guided  . SHUNTOGRAM N/A 04/06/2012   Procedure: Earney Mallet;  Surgeon: Angelia Mould, MD;  Location: [facility] CATH LAB;  Service: Cardiovascular;  Laterality: N/A;    There were no vitals filed for this visit.     03/29/16 1321  Symptoms/Limitations  Subjective No new complaints. No falls or pain to report.  Patient is accompained by: Family member  Pertinent History IDDM2 with neuropathy & retinopathy, kidney transplant (01/24/2013), respiratory failure 3X (09/27/2011, 10/14/2015 & 12/06/2015) hypoxia, HTN, CHF, PVD, gout (pt denies),   Limitations Lifting;Standing;Walking  Patient Stated Goals She wants to return to "normal" walking, driving, going out in community, working, going concerts, amusement parks.  Pain Assessment  Currently in Pain? No/denies      03/29/16 1325  Transfers  Transfers Sit to Stand;Stand to Sit  Sit to Stand 5: Supervision;With upper extremity assist;With armrests;From chair/3-in-1  Stand to Sit 5: Supervision;With upper extremity assist;With armrests;To  chair/3-in-1  Ambulation/Gait  Ambulation/Gait Yes  Ambulation/Gait Assistance 5: Supervision;4: Min guard (min guard on outdoor uneven surfaces)  Ambulation/Gait Assistance Details occasional cues on posture, step length and prosthetic positioning with gait  Ambulation Distance (Feet) 220 Feet (x1; )  Assistive device Prosthesis;Rolling walker  Gait Pattern Step-through pattern;Decreased step length - left;Decreased stance time - right;Decreased weight shift to right;Left circumduction;Antalgic;Trunk flexed;Narrow base of support;Decreased stride length;Decreased hip/knee flexion - right;Poor foot clearance - right  Ambulation Surface Level;Unlevel;Indoor;Outdoor;Paved  Stairs Yes  Stairs Assistance 5: Supervision  Stairs Assistance Details (indicate cue type and reason) cues on hand advancement along rails and weight shifting  Stair Management Technique Two rails;Step to pattern;Forwards  Number of Stairs 4  Ramp 5: Supervision  Ramp Details (indicate cue type and reason) with RW/prosthesis, cues on posture and technique  Curb 5: Supervision  Curb Details (indicate cue type and reason) with RW/prosthesis, cues on sequencing and technique  Prosthetics  Prosthetic Care Comments  pt educated on how to properly adjust/change shoes on prothesis as well as appropriate sandles to wear (ones with back and top straps) as she plans to go shoe shopping this weekend                          Current prosthetic wear tolerance (days/week)  daily  Current prosthetic wear tolerance (#hours/day)  3 hours 2x day  Residual limb condition  intact, no issues noted  Education Provided Residual limb care;Correct ply sock adjustment;Proper wear schedule/adjustment;Proper weight-bearing schedule/adjustment  Person(s) Educated Patient  Education Method Explanation;Demonstration;Verbal cues  Education Method Verbalized understanding;Returned demonstration;Verbal cues required  Donning Prosthesis 5  Doffing  Prosthesis 5         PT Short Term Goals - 03/18/16 2313      PT SHORT TERM GOAL #1   Title Patient demonstrates proper donning & verbalizes proper care. (Target Date: 04/19/2016)   Time 1   Period Months   Status New     PT SHORT TERM GOAL #2   Title Patient tolerates wear >10hrs total wear/day without skin issues. (Target Date: 04/19/2016)   Time 1   Period Months   Status New     PT SHORT TERM GOAL #3   Title Patient ambulates 300' with RW & prosthesis modified independent. (Target Date: 04/19/2016)   Time 1   Period Months   Status New     PT SHORT TERM GOAL #4   Title Pateint negotiates ramps, curbs & stairs (2 rails) with RW & prosthesis modified independent. (Target Date: 04/19/2016)   Time 1   Period Months     PT SHORT TERM GOAL #5   Title Patient reaches 7" anteriorly & to floor without balance loss with supervision. (Target Date: 04/19/2016)   Time 1   Period Months   Status New           PT Long Term Goals - 03/18/16 2306      PT LONG TERM GOAL #1   Title Patient verbalizes & demonstrates understanding of prosthetic care to enable safe use of prosthesis. (Target Date: 05/17/2016)   Time 2   Period Months   Status New     PT LONG TERM GOAL #2   Title Patient tolerates wear >90% of awake hours without skin integrity issues or limb tenderness to enable function throughout her day. (Target Date: 05/17/2016)   Time 2   Period Months   Status New     PT LONG TERM GOAL #3   Title Berg Balance >/= 45/56 to indicate lower fall risk. (Target Date: 05/17/2016)   Time 2   Period Months   Status New     PT LONG TERM GOAL #4   Title Patient ambulates 500' with LRAD & prosthesis including grass modified indepedent to enable function in community. (Target Date: 03/17/2016)   Time 2   Period Months   Status New     PT LONG TERM GOAL #5   Title Patient negotiates ramps, curbs & stairs (1 rail) modified independent to enable community access. (Target Date:  05/17/2016)   Time 2   Period Months   Status New        03/29/16 1325  Plan  Clinical Impression Statement today's skilled session continued to work on gait/barriers with RW/prosthesis with notable progress made in gait distance and to correct gait deviations. Pt still with lateral lean of pylon, to see prosthetist on Monday coming up. Pt is making steady progress toward goals and should benefit from continued PT to progress toward unmet goals.                                    Pt will benefit from skilled therapeutic intervention in order to improve on the following deficits Abnormal gait;Decreased activity tolerance;Decreased balance;Decreased endurance;Decreased knowledge of  use of DME;Decreased mobility;Decreased strength;Postural dysfunction;Prosthetic Dependency;Pain  Rehab Potential Good  PT Frequency 2x / week  PT Duration Other (comment) (9 weeks (60 days))  PT Treatment/Interventions ADLs/Self Care Home Management;Gait training;Stair training;Functional mobility training;Therapeutic activities;Therapeutic exercise;Balance training;Neuromuscular re-education;Patient/family education;Prosthetic Training  PT Next Visit Plan continued gait/barriers (practice stairs with 1 rail and wall on opposite side to better simulate home enviroment)  with RW/prosthesis, prosthetic management, initiate balance activites  Consulted and Agree with Plan of Care Patient       Patient will benefit from skilled therapeutic intervention in order to improve the following deficits and impairments:  Abnormal gait, Decreased activity tolerance, Decreased balance, Decreased endurance, Decreased knowledge of use of DME, Decreased mobility, Decreased strength, Postural dysfunction, Prosthetic Dependency, Pain  Visit Diagnosis: Other abnormalities of gait and mobility  Unsteadiness on feet  Muscle weakness (generalized)  Other symptoms and signs involving the musculoskeletal system     Problem  List Patient Active Problem List   Diagnosis Date Noted  . Transaminitis 12/06/2015  . Acute on chronic renal failure ([place]) 12/06/2015  . History of renal transplant 12/06/2015  . UTI (lower urinary tract infection) 12/06/2015  . Acute cholecystitis 12/06/2015  . Below knee amputation status ([place]) 11/29/2015  . Asystole ([place])   . S/P unilateral BKA (below knee amputation) ([place])   . Pericardial effusion   . Type 2 diabetes mellitus with peripheral neuropathy ([facility])   . Chronic kidney disease, stage IV (severe) ([place])   . Essential hypertension   . Anemia of chronic disease   . Acute blood loss anemia   . Gangrene associated with diabetes mellitus ([place]) 10/13/2015  . Critical lower limb ischemia 10/05/2015  . PAD (peripheral artery disease) ([place]) 09/18/2015  . Leukopenia 08/22/2014  . Protein-calorie malnutrition, severe ([place]) 12/24/2012  . Gastric ulcer 12/24/2012  . Mechanical complication of other vascular device, implant, and graft 09/22/2012  . End stage renal disease ([place]) 10/23/2011  . Physical deconditioning 10/09/2011  . HTN (hypertension), malignant 09/27/2011  . Chronic kidney disease, stage 4, severely decreased GFR ([facility]) 09/27/2011  . PNA (pneumonia) 09/27/2011  . Acute respiratory failure with hypoxia ([place]) 09/27/2011  . VOMITING 12/06/2009  . GOUT 12/21/2008  . Uncontrolled type 2 diabetes mellitus with peripheral artery disease ([place]) 08/14/2006  . HYPERLIPIDEMIA 08/14/2006  . OBESITY, NOS 08/14/2006  . FIBROADENOSIS, BREAST 08/14/2006  . Irregular menstrual cycle 08/14/2006    Willow Ora, PTA, [facility] [address] [place], [place] [address] [phone] 04/01/16, 10:43 AM   Name: [name] MRN: [id number] Date of Birth: 06/13/1971

## 2016-04-02 ENCOUNTER — Encounter: Payer: Self-pay | Admitting: Physical Therapy

## 2016-04-02 ENCOUNTER — Ambulatory Visit: Payer: Medicare Other | Admitting: Physical Therapy

## 2016-04-02 DIAGNOSIS — M6281 Muscle weakness (generalized): Secondary | ICD-10-CM

## 2016-04-02 DIAGNOSIS — R2689 Other abnormalities of gait and mobility: Secondary | ICD-10-CM | POA: Diagnosis not present

## 2016-04-02 DIAGNOSIS — R29898 Other symptoms and signs involving the musculoskeletal system: Secondary | ICD-10-CM | POA: Diagnosis not present

## 2016-04-02 DIAGNOSIS — R2681 Unsteadiness on feet: Secondary | ICD-10-CM | POA: Diagnosis not present

## 2016-04-02 NOTE — Therapy (Signed)
[facility] [address] [place], [place], [address] Phone: [phone]   Fax:  [phone]  Physical Therapy Treatment  Patient Details  Name: [name] MRN: [id number] Date of Birth: 1970-08-31 Referring Provider: Meridee Score, MD  Encounter Date: 04/02/2016      PT End of Session - 04/02/16 1042    Visit Number 5   Number of Visits 18   Date for PT Re-Evaluation 05/17/16   Authorization Type Medicare G-Code & progress note required   PT Start Time 0930   PT Stop Time 1015   PT Time Calculation (min) 45 min   Equipment Utilized During Treatment Gait belt   Activity Tolerance Patient tolerated treatment well   Behavior During Therapy [facility] for tasks assessed/performed      Past Medical History:  Diagnosis Date  . Anemia   . CHF (congestive heart failure) ([place])   . Critical ischemia of lower extremity hospitalized 10/05/2015   right  . Diabetic neuropathy ([place])   . ESRD (end stage renal disease) on dialysis ([place]) 09/28/2011-01/23/2013   M-W-F; [place]  . GERD (gastroesophageal reflux disease)   . High cholesterol   . History of blood transfusion    related to "kidney transplant"  . Hypertension    sees Dr. Carolin Guernsey  . Metabolic bone disease   . Peripheral vascular disease ([place])   . Pneumonia 09/27/2011  . Retinopathy   . Type II diabetes mellitus ([place])    "controlled with diet"    Past Surgical History:  Procedure Laterality Date  . AMPUTATION Right 10/13/2015   Procedure: Right Transmetatarsal Amputation;  Surgeon: Newt Minion, MD;  Location: [facility];  Service: Orthopedics;  Laterality: Right;  . AMPUTATION Right 11/29/2015   Procedure: RIGHT BELOW KNEE AMPUTATION;  Surgeon: Newt Minion, MD;  Location: [facility];  Service: Orthopedics;  Laterality: Right;  . AV FISTULA PLACEMENT  10/04/2011   Procedure: INSERTION OF ARTERIOVENOUS (AV) GORE-TEX GRAFT ARM;  Surgeon: Angelia Mould, MD;   Location: [facility];  Service: Vascular;  Laterality: Left;  Insertion left upper arm Arteriovenous goretex graft  . AV FISTULA PLACEMENT  10/29/2011   Procedure: ARTERIOVENOUS (AV) FISTULA CREATION;  Surgeon: Angelia Mould, MD;  Location: [facility] OR;  Service: Vascular;  Laterality: Right;  Creation Right Arteriovenous Fistula   . [place] REMOVAL  10/04/2011   Procedure: REMOVAL OF ARTERIOVENOUS GORETEX GRAFT ([place]);  Surgeon: Elam Dutch, MD;  Location: [facility];  Service: Vascular;  Laterality: Left;  . CHOLECYSTECTOMY N/A 12/08/2015   Procedure: LAPAROSCOPIC CHOLECYSTECTOMY WITH INTRAOPERATIVE CHOLANGIOGRAM;  Surgeon: Stark Klein, MD;  Location: [facility];  Service: General;  Laterality: N/A;  . ESOPHAGOGASTRODUODENOSCOPY N/A 12/24/2012   Procedure: ESOPHAGOGASTRODUODENOSCOPY (EGD);  Surgeon: Milus Banister, MD;  Location: [facility];  Service: Endoscopy;  Laterality: N/A;  . FINGER AMPUTATION Right 02/26/2013   "3rd finger"  . INSERTION OF DIALYSIS CATHETER  10/04/2011   Procedure: INSERTION OF DIALYSIS CATHETER;  Surgeon: Angelia Mould, MD;  Location: [facility];  Service: Vascular;  Laterality: Right;  insertion of dialysis catheter right internal jugular  . INSERTION OF DIALYSIS CATHETER  06/23/2012   Procedure: INSERTION OF DIALYSIS CATHETER;  Surgeon: Angelia Mould, MD;  Location: [facility];  Service: Vascular;  Laterality: N/A;  Ultrasound guided  . KIDNEY TRANSPLANT  01/24/2013  . PATCH ANGIOPLASTY  06/23/2012   Procedure: PATCH ANGIOPLASTY;  Surgeon: Angelia Mould, MD;  Location: [facility];  Service:  Vascular;  Laterality: Right;  . PERIPHERAL VASCULAR CATHETERIZATION N/A 09/19/2015   Procedure: Lower Extremity Angiography;  Surgeon: Adrian Prows, MD;  Location: [facility];  Service: Cardiovascular;  Laterality: N/A;  . PERIPHERAL VASCULAR CATHETERIZATION N/A 09/19/2015   Procedure: Abdominal Aortogram;  Surgeon: Adrian Prows, MD;  Location: [facility];  Service: Cardiovascular;   Laterality: N/A;  . PERIPHERAL VASCULAR CATHETERIZATION Right 09/19/2015   Procedure: Peripheral Vascular Intervention;  Surgeon: Adrian Prows, MD;  Location: [facility];  Service: Cardiovascular;  Laterality: Right;  Right Common  Iliac  . PERIPHERAL VASCULAR CATHETERIZATION N/A 10/06/2015   Procedure: Lower Extremity Angiography;  Surgeon: Adrian Prows, MD;  Location: [facility];  Service: Cardiovascular;  Laterality: N/A;  . PERIPHERAL VASCULAR CATHETERIZATION  10/06/2015   Procedure: Peripheral Vascular Intervention;  Surgeon: Adrian Prows, MD;  Location: [facility];  Service: Cardiovascular;;  REIA  Omnilink 6.0x29, innova 7x80  RSFA innova 5x80  . REVISON OF ARTERIOVENOUS FISTULA  06/23/2012   Procedure: REVISON OF ARTERIOVENOUS FISTULA;  Surgeon: Angelia Mould, MD;  Location: [facility];  Service: Vascular;  Laterality: Right;  Ultrasound guided  . SHUNTOGRAM N/A 04/06/2012   Procedure: Earney Mallet;  Surgeon: Angelia Mould, MD;  Location: [facility] CATH LAB;  Service: Cardiovascular;  Laterality: N/A;    There were no vitals filed for this visit.      Subjective Assessment - 04/02/16 0932    Subjective No falls. Wearing prosthesis 3hrs 2x/day except Sunday wore it 4hrs 2x/day without pain or issues.     Pertinent History IDDM2 with neuropathy & retinopathy, kidney transplant (01/24/2013), respiratory failure 3X (09/27/2011, 10/14/2015 & 12/06/2015) hypoxia, HTN, CHF, PVD, gout (pt denies),    Limitations Lifting;Standing;Walking   Patient Stated Goals She wants to return to "normal" walking, driving, going out in community, working, going concerts, amusement parks.   Currently in Pain? No/denies                         [facility] Adult PT Treatment/Exercise - 04/02/16 0930      Transfers   Transfers Sit to Stand;Stand to Sit   Sit to Stand 5: Supervision;With upper extremity assist;With armrests;From chair/3-in-1   Stand to Sit 5: Supervision;With upper  extremity assist;With armrests;To chair/3-in-1     Ambulation/Gait   Ambulation/Gait Yes   Ambulation/Gait Assistance 5: Supervision;4: Min guard   Ambulation/Gait Assistance Details verbal, tactile & visual cues on proper step length, step width, fluency of gait using "song" to facilitate and decreasing UE support.    Ambulation Distance (Feet) 350 Feet  350' X 1 and 200' X 2   Assistive device Prosthesis;Rolling walker   Gait Pattern Step-through pattern;Decreased step length - left;Decreased stance time - right;Decreased weight shift to right;Left circumduction;Antalgic;Trunk flexed;Narrow base of support;Decreased stride length;Decreased hip/knee flexion - right;Poor foot clearance - right   Stairs --   Stairs Assistance --   Stair Management Technique --   Number of Stairs --   Ramp --   Curb --     Prosthetics   Prosthetic Care Comments  PT instructed in adjusting ply socks with tactile cues on pressure with too few, too many & correct ply fit. PT instructed in signs of sweat & need to dry limb/liner. Use of cloth to rub limb instead of scratching with removing prosthesis if limb itches. Use of Bag Balm or lotion for dry skin management and not wearing with prosthesis (overnight  on residual limb)   Current prosthetic wear tolerance (days/week)  daily   Current prosthetic wear tolerance (#hours/day)  3 hours 2x day; PT increased to 4hrs 2x/day & dry prn.    Residual limb condition  intact, no issues   Education Provided Residual limb care;Correct ply sock adjustment;Proper wear schedule/adjustment;Proper weight-bearing schedule/adjustment;Skin check;Ply sock cleaning;Other (comment)  see prostthetic care comments   Person(s) Educated Patient   Education Method Explanation;Demonstration;Tactile cues;Verbal cues   Education Method Verbalized understanding;Verbal cues required;Needs further instruction                  PT Short Term Goals - 04/02/16 1042      PT SHORT TERM  GOAL #1   Title Patient demonstrates proper donning & verbalizes proper care. (Target Date: 04/19/2016)   Time 1   Period Months   Status On-going     PT SHORT TERM GOAL #2   Title Patient tolerates wear >10hrs total wear/day without skin issues. (Target Date: 04/19/2016)   Time 1   Period Months   Status On-going     PT SHORT TERM GOAL #3   Title Patient ambulates 300' with RW & prosthesis modified independent. (Target Date: 04/19/2016)   Time 1   Period Months   Status On-going     PT SHORT TERM GOAL #4   Title Pateint negotiates ramps, curbs & stairs (2 rails) with RW & prosthesis modified independent. (Target Date: 04/19/2016)   Time 1   Period Months   Status On-going     PT SHORT TERM GOAL #5   Title Patient reaches 7" anteriorly & to floor without balance loss with supervision. (Target Date: 04/19/2016)   Time 1   Period Months   Status On-going           PT Long Term Goals - 04/02/16 1043      PT LONG TERM GOAL #1   Title Patient verbalizes & demonstrates understanding of prosthetic care to enable safe use of prosthesis. (Target Date: 05/17/2016)   Time 2   Period Months   Status On-going     PT LONG TERM GOAL #2   Title Patient tolerates wear >90% of awake hours without skin integrity issues or limb tenderness to enable function throughout her day. (Target Date: 05/17/2016)   Time 2   Period Months   Status On-going     PT LONG TERM GOAL #3   Title Berg Balance >/= 45/56 to indicate lower fall risk. (Target Date: 05/17/2016)   Time 2   Period Months   Status On-going     PT LONG TERM GOAL #4   Title Patient ambulates 500' with LRAD & prosthesis including grass modified indepedent to enable function in community. (Target Date: 03/17/2016)   Time 2   Period Months   Status On-going     PT LONG TERM GOAL #5   Title Patient negotiates ramps, curbs & stairs (1 rail) modified independent to enable community access. (Target Date: 05/17/2016)   Time 2   Period  Months   Status On-going               Plan - 04/02/16 1043    Clinical Impression Statement Patient verbalizes better understanding of adjusting ply socks & sweat management with prosthesis wear. She improved fluency of gait with skilled instruction but reports increased LE fatigue with focus on decreasing UE support.    Rehab Potential Good   PT Frequency 2x / week   PT  Duration Other (comment)  9 weeks (60 days)   PT Treatment/Interventions ADLs/Self Care Home Management;Gait training;Stair training;Functional mobility training;Therapeutic activities;Therapeutic exercise;Balance training;Neuromuscular re-education;Patient/family education;Prosthetic Training   PT Next Visit Plan continued gait/barriers (practice stairs with 1 rail and wall on opposite side to better simulate home enviroment)  with RW/prosthesis, prosthetic management, initiate balance activites   Consulted and Agree with Plan of Care Patient      Patient will benefit from skilled therapeutic intervention in order to improve the following deficits and impairments:  Abnormal gait, Decreased activity tolerance, Decreased balance, Decreased endurance, Decreased knowledge of use of DME, Decreased mobility, Decreased strength, Postural dysfunction, Prosthetic Dependency, Pain  Visit Diagnosis: Other abnormalities of gait and mobility  Unsteadiness on feet  Muscle weakness (generalized)  Other symptoms and signs involving the musculoskeletal system     Problem List Patient Active Problem List   Diagnosis Date Noted  . Transaminitis 12/06/2015  . Acute on chronic renal failure ([place]) 12/06/2015  . History of renal transplant 12/06/2015  . UTI (lower urinary tract infection) 12/06/2015  . Acute cholecystitis 12/06/2015  . Below knee amputation status ([place]) 11/29/2015  . Asystole ([place])   . S/P unilateral BKA (below knee amputation) ([place])   . Pericardial effusion   . Type 2 diabetes mellitus with peripheral  neuropathy ([facility])   . Chronic kidney disease, stage IV (severe) ([place])   . Essential hypertension   . Anemia of chronic disease   . Acute blood loss anemia   . Gangrene associated with diabetes mellitus ([place]) 10/13/2015  . Critical lower limb ischemia 10/05/2015  . PAD (peripheral artery disease) ([place]) 09/18/2015  . Leukopenia 08/22/2014  . Protein-calorie malnutrition, severe ([place]) 12/24/2012  . Gastric ulcer 12/24/2012  . Mechanical complication of other vascular device, implant, and graft 09/22/2012  . End stage renal disease ([place]) 10/23/2011  . Physical deconditioning 10/09/2011  . HTN (hypertension), malignant 09/27/2011  . Chronic kidney disease, stage 4, severely decreased GFR ([facility]) 09/27/2011  . PNA (pneumonia) 09/27/2011  . Acute respiratory failure with hypoxia ([place]) 09/27/2011  . VOMITING 12/06/2009  . GOUT 12/21/2008  . Uncontrolled type 2 diabetes mellitus with peripheral artery disease ([place]) 08/14/2006  . HYPERLIPIDEMIA 08/14/2006  . OBESITY, NOS 08/14/2006  . FIBROADENOSIS, BREAST 08/14/2006  . Irregular menstrual cycle 08/14/2006    Cordarrel Stiefel PT, DPT 04/02/2016, 10:50 AM  [facility] [address] [place], [place], [address] Phone: [phone]   Fax:  [phone]  Name: [name] MRN: [id number] Date of Birth: 14-Apr-1971

## 2016-04-05 ENCOUNTER — Ambulatory Visit: Payer: Medicare Other | Admitting: Physical Therapy

## 2016-04-05 ENCOUNTER — Encounter: Payer: Self-pay | Admitting: Physical Therapy

## 2016-04-05 DIAGNOSIS — R29898 Other symptoms and signs involving the musculoskeletal system: Secondary | ICD-10-CM

## 2016-04-05 DIAGNOSIS — R2689 Other abnormalities of gait and mobility: Secondary | ICD-10-CM

## 2016-04-05 DIAGNOSIS — R2681 Unsteadiness on feet: Secondary | ICD-10-CM

## 2016-04-05 DIAGNOSIS — M6281 Muscle weakness (generalized): Secondary | ICD-10-CM

## 2016-04-05 NOTE — Therapy (Signed)
[facility] [address] [name], [place], [address] Phone: [phone]   Fax:  [phone]  Physical Therapy Treatment  Patient Details  Name: [name] MRN: [id number] Date of Birth: 08-15-1970 Referring Provider: Meridee Score, MD  Encounter Date: 04/05/2016      PT End of Session - 04/05/16 0935    Visit Number 6   Number of Visits 18   Date for PT Re-Evaluation 05/17/16   Authorization Type Medicare G-Code & progress note required   PT Start Time 0931   PT Stop Time 1012   PT Time Calculation (min) 41 min   Equipment Utilized During Treatment Gait belt   Activity Tolerance Patient tolerated treatment well   Behavior During Therapy [facility] for tasks assessed/performed      Past Medical History:  Diagnosis Date  . Anemia   . CHF (congestive heart failure) ([place])   . Critical ischemia of lower extremity hospitalized 10/05/2015   right  . Diabetic neuropathy ([place])   . ESRD (end stage renal disease) on dialysis ([place]) 09/28/2011-01/23/2013   M-W-F; [place]  . GERD (gastroesophageal reflux disease)   . High cholesterol   . History of blood transfusion    related to "kidney transplant"  . Hypertension    sees Dr. Carolin Guernsey  . Metabolic bone disease   . Peripheral vascular disease ([place])   . Pneumonia 09/27/2011  . Retinopathy   . Type II diabetes mellitus ([place])    "controlled with diet"    Past Surgical History:  Procedure Laterality Date  . AMPUTATION Right 10/13/2015   Procedure: Right Transmetatarsal Amputation;  Surgeon: Newt Minion, MD;  Location: [name];  Service: Orthopedics;  Laterality: Right;  . AMPUTATION Right 11/29/2015   Procedure: RIGHT BELOW KNEE AMPUTATION;  Surgeon: Newt Minion, MD;  Location: [place];  Service: Orthopedics;  Laterality: Right;  . AV FISTULA PLACEMENT  10/04/2011   Procedure: INSERTION OF ARTERIOVENOUS (AV) GORE-TEX GRAFT ARM;  Surgeon: Angelia Mould, MD;   Location: [place];  Service: Vascular;  Laterality: Left;  Insertion left upper arm Arteriovenous goretex graft  . AV FISTULA PLACEMENT  10/29/2011   Procedure: ARTERIOVENOUS (AV) FISTULA CREATION;  Surgeon: Angelia Mould, MD;  Location: [facility] OR;  Service: Vascular;  Laterality: Right;  Creation Right Arteriovenous Fistula   . [place] REMOVAL  10/04/2011   Procedure: REMOVAL OF ARTERIOVENOUS GORETEX GRAFT ([place]);  Surgeon: Elam Dutch, MD;  Location: [place];  Service: Vascular;  Laterality: Left;  . CHOLECYSTECTOMY N/A 12/08/2015   Procedure: LAPAROSCOPIC CHOLECYSTECTOMY WITH INTRAOPERATIVE CHOLANGIOGRAM;  Surgeon: Stark Klein, MD;  Location: [place];  Service: General;  Laterality: N/A;  . ESOPHAGOGASTRODUODENOSCOPY N/A 12/24/2012   Procedure: ESOPHAGOGASTRODUODENOSCOPY (EGD);  Surgeon: Milus Banister, MD;  Location: [place];  Service: Endoscopy;  Laterality: N/A;  . FINGER AMPUTATION Right 02/26/2013   "3rd finger"  . INSERTION OF DIALYSIS CATHETER  10/04/2011   Procedure: INSERTION OF DIALYSIS CATHETER;  Surgeon: Angelia Mould, MD;  Location: [place];  Service: Vascular;  Laterality: Right;  insertion of dialysis catheter right internal jugular  . INSERTION OF DIALYSIS CATHETER  06/23/2012   Procedure: INSERTION OF DIALYSIS CATHETER;  Surgeon: Angelia Mould, MD;  Location: [place];  Service: Vascular;  Laterality: N/A;  Ultrasound guided  . KIDNEY TRANSPLANT  01/24/2013  . PATCH ANGIOPLASTY  06/23/2012   Procedure: PATCH ANGIOPLASTY;  Surgeon: Angelia Mould, MD;  Location: [place];  Service:  Vascular;  Laterality: Right;  . PERIPHERAL VASCULAR CATHETERIZATION N/A 09/19/2015   Procedure: Lower Extremity Angiography;  Surgeon: Adrian Prows, MD;  Location: [place] [facility];  Service: Cardiovascular;  Laterality: N/A;  . PERIPHERAL VASCULAR CATHETERIZATION N/A 09/19/2015   Procedure: Abdominal Aortogram;  Surgeon: Adrian Prows, MD;  Location: [place] [facility];  Service: Cardiovascular;   Laterality: N/A;  . PERIPHERAL VASCULAR CATHETERIZATION Right 09/19/2015   Procedure: Peripheral Vascular Intervention;  Surgeon: Adrian Prows, MD;  Location: [place] [facility];  Service: Cardiovascular;  Laterality: Right;  Right Common  Iliac  . PERIPHERAL VASCULAR CATHETERIZATION N/A 10/06/2015   Procedure: Lower Extremity Angiography;  Surgeon: Adrian Prows, MD;  Location: [place] [facility];  Service: Cardiovascular;  Laterality: N/A;  . PERIPHERAL VASCULAR CATHETERIZATION  10/06/2015   Procedure: Peripheral Vascular Intervention;  Surgeon: Adrian Prows, MD;  Location: [place] [facility];  Service: Cardiovascular;;  REIA  Omnilink 6.0x29, innova 7x80  RSFA innova 5x80  . REVISON OF ARTERIOVENOUS FISTULA  06/23/2012   Procedure: REVISON OF ARTERIOVENOUS FISTULA;  Surgeon: Angelia Mould, MD;  Location: [place];  Service: Vascular;  Laterality: Right;  Ultrasound guided  . SHUNTOGRAM N/A 04/06/2012   Procedure: [place];  Surgeon: Angelia Mould, MD;  Location: [facility] CATH LAB;  Service: Cardiovascular;  Laterality: N/A;    There were no vitals filed for this visit.      Subjective Assessment - 04/05/16 0935    Subjective No new complaints. No falls or pain to report.    Patient is accompained by: Family member   Pertinent History IDDM2 with neuropathy & retinopathy, kidney transplant (01/24/2013), respiratory failure 3X (09/27/2011, 10/14/2015 & 12/06/2015) hypoxia, HTN, CHF, PVD, gout (pt denies),    Limitations Lifting;Standing;Walking   Patient Stated Goals She wants to return to "normal" walking, driving, going out in community, working, going concerts, amusement parks.   Currently in Pain? No/denies            [facility] Adult PT Treatment/Exercise - 04/05/16 0938      Transfers   Transfers Sit to Stand;Stand to Sit   Sit to Stand 5: Supervision;With upper extremity assist;With armrests;From chair/3-in-1   Stand to Sit 5: Supervision;With upper extremity assist;With armrests;To  chair/3-in-1     Ambulation/Gait   Ambulation/Gait Yes   Ambulation/Gait Assistance 5: Supervision   Ambulation/Gait Assistance Details verbal and tactial cues/facilitation for pelvic positioning and movements with prosthetic advancement,  to increas BOS and decrease R hip external rotation. Mirrors used to provide Warden/ranger with task.                                           Ambulation Distance (Feet) 250 Feet  x2; plus laps at back of gym to correct gait deviations   Assistive device Prosthesis;Rolling walker   Gait Pattern Step-through pattern;Decreased stance time - right;Decreased weight shift to right;Antalgic;Trunk flexed;Narrow base of support;Decreased step length - left  R hip exernally rotated   Ambulation Surface Level;Indoor   Stairs No     Prosthetics   Prosthetic Care Comments  Patient demonstrated donning and doffing prostesis. Demonstrated awareness of need to adjust socks due to pain during ambulation.   Current prosthetic wear tolerance (days/week)  daily   Current prosthetic wear tolerance (#hours/day)  4 hrs 2x/day, dry PRN   Residual limb condition  intact, no issues   Education  Provided Residual limb care;Correct ply sock adjustment;Proper wear schedule/adjustment;Proper weight-bearing schedule/adjustment;Skin check;Other (comment)   Person(s) Educated Patient   Education Method Explanation;Demonstration;Tactile cues;Verbal cues   Education Method Verbalized understanding;Needs further instruction   Donning Prosthesis Supervision   Doffing Prosthesis Supervision            PT Short Term Goals - 04/02/16 1042      PT SHORT TERM GOAL #1   Title Patient demonstrates proper donning & verbalizes proper care. (Target Date: 04/19/2016)   Time 1   Period Months   Status On-going     PT SHORT TERM GOAL #2   Title Patient tolerates wear >10hrs total wear/day without skin issues. (Target Date: 04/19/2016)   Time 1   Period Months   Status On-going     PT  SHORT TERM GOAL #3   Title Patient ambulates 300' with RW & prosthesis modified independent. (Target Date: 04/19/2016)   Time 1   Period Months   Status On-going     PT SHORT TERM GOAL #4   Title Pateint negotiates ramps, curbs & stairs (2 rails) with RW & prosthesis modified independent. (Target Date: 04/19/2016)   Time 1   Period Months   Status On-going     PT SHORT TERM GOAL #5   Title Patient reaches 7" anteriorly & to floor without balance loss with supervision. (Target Date: 04/19/2016)   Time 1   Period Months   Status On-going           PT Long Term Goals - 04/02/16 1043      PT LONG TERM GOAL #1   Title Patient verbalizes & demonstrates understanding of prosthetic care to enable safe use of prosthesis. (Target Date: 05/17/2016)   Time 2   Period Months   Status On-going     PT LONG TERM GOAL #2   Title Patient tolerates wear >90% of awake hours without skin integrity issues or limb tenderness to enable function throughout her day. (Target Date: 05/17/2016)   Time 2   Period Months   Status On-going     PT LONG TERM GOAL #3   Title Berg Balance >/= 45/56 to indicate lower fall risk. (Target Date: 05/17/2016)   Time 2   Period Months   Status On-going     PT LONG TERM GOAL #4   Title Patient ambulates 500' with LRAD & prosthesis including grass modified indepedent to enable function in community. (Target Date: 03/17/2016)   Time 2   Period Months   Status On-going     PT LONG TERM GOAL #5   Title Patient negotiates ramps, curbs & stairs (1 rail) modified independent to enable community access. (Target Date: 05/17/2016)   Time 2   Period Months   Status On-going            Plan - 04/05/16 0936    Clinical Impression Statement today's skilled session continued to address gait with prothesis/RW with emphasis on correcting gait deviations. Pt did have some in session carryover on decreasd UE reliance and increased base of support. Needs continued work on  pelvic alingment and movements with gait. Pt is making steady progress toward goals and should benefit from continued PT to progress toward unmet goals.  Rehab Potential Good   PT Frequency 2x / week   PT Duration Other (comment)  9 weeks (60 days)   PT Treatment/Interventions ADLs/Self Care Home Management;Gait training;Stair training;Functional mobility training;Therapeutic activities;Therapeutic exercise;Balance training;Neuromuscular re-education;Patient/family education;Prosthetic Training   PT Next Visit Plan continued gait/barriers (practice stairs with 1 rail and wall on opposite side to better simulate home enviroment)  with RW/prosthesis, prosthetic management, initiate balance activites   Consulted and Agree with Plan of Care Patient      Patient will benefit from skilled therapeutic intervention in order to improve the following deficits and impairments:  Abnormal gait, Decreased activity tolerance, Decreased balance, Decreased endurance, Decreased knowledge of use of DME, Decreased mobility, Decreased strength, Postural dysfunction, Prosthetic Dependency, Pain  Visit Diagnosis: Other abnormalities of gait and mobility  Unsteadiness on feet  Muscle weakness (generalized)  Other symptoms and signs involving the musculoskeletal system     Problem List Patient Active Problem List   Diagnosis Date Noted  . Transaminitis 12/06/2015  . Acute on chronic renal failure ([place]) 12/06/2015  . History of renal transplant 12/06/2015  . UTI (lower urinary tract infection) 12/06/2015  . Acute cholecystitis 12/06/2015  . Below knee amputation status ([place]) 11/29/2015  . Asystole ([place])   . S/P unilateral BKA (below knee amputation) ([place])   . Pericardial effusion   . Type 2 diabetes mellitus with peripheral neuropathy ([facility])   . Chronic kidney disease, stage IV (severe) ([place])   . Essential hypertension   . Anemia of chronic disease   . Acute  blood loss anemia   . Gangrene associated with diabetes mellitus ([place]) 10/13/2015  . Critical lower limb ischemia 10/05/2015  . PAD (peripheral artery disease) ([place]) 09/18/2015  . Leukopenia 08/22/2014  . Protein-calorie malnutrition, severe ([place]) 12/24/2012  . Gastric ulcer 12/24/2012  . Mechanical complication of other vascular device, implant, and graft 09/22/2012  . End stage renal disease ([place]) 10/23/2011  . Physical deconditioning 10/09/2011  . HTN (hypertension), malignant 09/27/2011  . Chronic kidney disease, stage 4, severely decreased GFR ([facility]) 09/27/2011  . PNA (pneumonia) 09/27/2011  . Acute respiratory failure with hypoxia ([place]) 09/27/2011  . VOMITING 12/06/2009  . GOUT 12/21/2008  . Uncontrolled type 2 diabetes mellitus with peripheral artery disease ([place]) 08/14/2006  . HYPERLIPIDEMIA 08/14/2006  . OBESITY, NOS 08/14/2006  . FIBROADENOSIS, BREAST 08/14/2006  . Irregular menstrual cycle 08/14/2006    Willow Ora, PTA, [facility] Neuro [facility] [address] [place], [place] [address] [phone] 04/05/16, 1:05 PM   Name: [name] MRN: [id number] Date of Birth: 10-28-1970

## 2016-04-09 ENCOUNTER — Ambulatory Visit: Payer: Medicare Other | Admitting: Physical Therapy

## 2016-04-09 ENCOUNTER — Telehealth (INDEPENDENT_AMBULATORY_CARE_PROVIDER_SITE_OTHER): Payer: Self-pay

## 2016-04-09 ENCOUNTER — Encounter: Payer: Self-pay | Admitting: Physical Therapy

## 2016-04-09 DIAGNOSIS — R29898 Other symptoms and signs involving the musculoskeletal system: Secondary | ICD-10-CM | POA: Diagnosis not present

## 2016-04-09 DIAGNOSIS — M6281 Muscle weakness (generalized): Secondary | ICD-10-CM | POA: Diagnosis not present

## 2016-04-09 DIAGNOSIS — R2681 Unsteadiness on feet: Secondary | ICD-10-CM

## 2016-04-09 DIAGNOSIS — R2689 Other abnormalities of gait and mobility: Secondary | ICD-10-CM | POA: Diagnosis not present

## 2016-04-09 NOTE — Telephone Encounter (Signed)
Pt wants paperwork faxed to [phone] [employer] OF [employer]

## 2016-04-09 NOTE — Therapy (Signed)
[facility] [address] [place], [place], [address] Phone: [phone]   Fax:  [phone]  Physical Therapy Treatment  Patient Details  Name: [name] MRN: [id number] Date of Birth: 07/09/1970 Referring Provider: Meridee Score, MD  Encounter Date: 04/09/2016      PT End of Session - 04/09/16 1025    Visit Number 7   Number of Visits 18   Date for PT Re-Evaluation 05/17/16   Authorization Type Medicare G-Code & progress note required   PT Start Time 0930   PT Stop Time 1014   PT Time Calculation (min) 44 min   Equipment Utilized During Treatment Gait belt   Activity Tolerance Patient tolerated treatment well   Behavior During Therapy [facility] for tasks assessed/performed      Past Medical History:  Diagnosis Date  . Anemia   . CHF (congestive heart failure) ([place])   . Critical ischemia of lower extremity hospitalized 10/05/2015   right  . Diabetic neuropathy ([place])   . ESRD (end stage renal disease) on dialysis ([place]) 09/28/2011-01/23/2013   M-W-F; [place]  . GERD (gastroesophageal reflux disease)   . High cholesterol   . History of blood transfusion    related to "kidney transplant"  . Hypertension    sees Dr. Carolin Guernsey  . Metabolic bone disease   . Peripheral vascular disease ([place])   . Pneumonia 09/27/2011  . Retinopathy   . Type II diabetes mellitus ([place])    "controlled with diet"    Past Surgical History:  Procedure Laterality Date  . AMPUTATION Right 10/13/2015   Procedure: Right Transmetatarsal Amputation;  Surgeon: Newt Minion, MD;  Location: [facility];  Service: Orthopedics;  Laterality: Right;  . AMPUTATION Right 11/29/2015   Procedure: RIGHT BELOW KNEE AMPUTATION;  Surgeon: Newt Minion, MD;  Location: [facility];  Service: Orthopedics;  Laterality: Right;  . AV FISTULA PLACEMENT  10/04/2011   Procedure: INSERTION OF ARTERIOVENOUS (AV) GORE-TEX GRAFT ARM;  Surgeon: Angelia Mould, MD;   Location: [facility];  Service: Vascular;  Laterality: Left;  Insertion left upper arm Arteriovenous goretex graft  . AV FISTULA PLACEMENT  10/29/2011   Procedure: ARTERIOVENOUS (AV) FISTULA CREATION;  Surgeon: Angelia Mould, MD;  [facility];  Service: Vascular;  Laterality: Right;  Creation Right Arteriovenous Fistula   . [place] REMOVAL  10/04/2011   Procedure: REMOVAL OF ARTERIOVENOUS GORETEX GRAFT ([place]);  Surgeon: Elam Dutch, MD;  Location: [facility];  Service: Vascular;  Laterality: Left;  . CHOLECYSTECTOMY N/A 12/08/2015   Procedure: LAPAROSCOPIC CHOLECYSTECTOMY WITH INTRAOPERATIVE CHOLANGIOGRAM;  Surgeon: Stark Klein, MD;  Location: [facility];  Service: General;  Laterality: N/A;  . ESOPHAGOGASTRODUODENOSCOPY N/A 12/24/2012   Procedure: ESOPHAGOGASTRODUODENOSCOPY (EGD);  Surgeon: Milus Banister, MD;  Location: [facility];  Service: Endoscopy;  Laterality: N/A;  . FINGER AMPUTATION Right 02/26/2013   "3rd finger"  . INSERTION OF DIALYSIS CATHETER  10/04/2011   Procedure: INSERTION OF DIALYSIS CATHETER;  Surgeon: Angelia Mould, MD;  Location: [facility];  Service: Vascular;  Laterality: Right;  insertion of dialysis catheter right internal jugular  . INSERTION OF DIALYSIS CATHETER  06/23/2012   Procedure: INSERTION OF DIALYSIS CATHETER;  Surgeon: Angelia Mould, MD;  Location: [facility];  Service: Vascular;  Laterality: N/A;  Ultrasound guided  . KIDNEY TRANSPLANT  01/24/2013  . PATCH ANGIOPLASTY  06/23/2012   Procedure: PATCH ANGIOPLASTY;  Surgeon: Angelia Mould, MD;  Location: [facility];  Service:  Vascular;  Laterality: Right;  . PERIPHERAL VASCULAR CATHETERIZATION N/A 09/19/2015   Procedure: Lower Extremity Angiography;  Surgeon: Adrian Prows, MD;  Location: [facility] CV [facility];  Service: Cardiovascular;  Laterality: N/A;  . PERIPHERAL VASCULAR CATHETERIZATION N/A 09/19/2015   Procedure: Abdominal Aortogram;  Surgeon: Adrian Prows, MD;  Location: [facility] CV LAB;  Service: Cardiovascular;   Laterality: N/A;  . PERIPHERAL VASCULAR CATHETERIZATION Right 09/19/2015   Procedure: Peripheral Vascular Intervention;  Surgeon: Adrian Prows, MD;  Location: [facility];  Service: Cardiovascular;  Laterality: Right;  Right Common  Iliac  . PERIPHERAL VASCULAR CATHETERIZATION N/A 10/06/2015   Procedure: Lower Extremity Angiography;  Surgeon: Adrian Prows, MD;  Location: [facility] CV LAB;  Service: Cardiovascular;  Laterality: N/A;  . PERIPHERAL VASCULAR CATHETERIZATION  10/06/2015   Procedure: Peripheral Vascular Intervention;  Surgeon: Adrian Prows, MD;  Location: [place] CV LAB;  Service: Cardiovascular;;  REIA  Omnilink 6.0x29, innova 7x80  RSFA innova 5x80  . REVISON OF ARTERIOVENOUS FISTULA  06/23/2012   Procedure: REVISON OF ARTERIOVENOUS FISTULA;  Surgeon: Angelia Mould, MD;  Location: [facility];  Service: Vascular;  Laterality: Right;  Ultrasound guided  . SHUNTOGRAM N/A 04/06/2012   Procedure: Earney Mallet;  Surgeon: Angelia Mould, MD;  Location: [facility] CATH LAB;  Service: Cardiovascular;  Laterality: N/A;    There were no vitals filed for this visit.      Subjective Assessment - 04/09/16 0935    Subjective She fell Saturday night after she removed prosthesis for 2nd wear. Some minor bruises and muscle pain on right LE /buttocks. Wearing prosthesis 4hrs 2x/day without issues.    Pertinent History IDDM2 with neuropathy & retinopathy, kidney transplant (01/24/2013), respiratory failure 3X (09/27/2011, 10/14/2015 & 12/06/2015) hypoxia, HTN, CHF, PVD, gout (pt denies),    Limitations Lifting;Standing;Walking   Patient Stated Goals She wants to return to "normal" walking, driving, going out in community, working, going concerts, amusement parks.   Currently in Pain? No/denies                         [facility] Adult PT Treatment/Exercise - 04/09/16 0930      Transfers   Transfers Sit to Stand;Stand to Sit;Stand Pivot Transfers   Sit to Stand 5: Supervision;With upper  extremity assist;With armrests;From chair/3-in-1  to rollator walker   Sit to Stand Details (indicate cue type and reason) PT demo, instructed in rollator walker safety.   Stand to Sit 5: Supervision;With upper extremity assist;With armrests;To chair/3-in-1  from rollator walker   Stand to Sit Details PT demo, instructed in rollator walker safety.   Stand Pivot Transfers 5: Supervision;With armrests  to/from rollator walker seat   Stand Pivot Transfer Details (indicate cue type and reason) PT demo, instructed in rollator walker safety.     Ambulation/Gait   Ambulation/Gait Yes   Ambulation/Gait Assistance 5: Supervision   Ambulation/Gait Assistance Details PT demo, instructed in rollator walker safety.. Verbal cues on step width & length with fluency & decreased UE support.    Ambulation Distance (Feet) 250 Feet  250' X 2 (one with RW & one with rollator)   Assistive device Prosthesis;Rolling walker;Rollator   Gait Pattern Step-through pattern;Decreased stance time - right;Decreased weight shift to right;Antalgic;Trunk flexed;Narrow base of support;Decreased step length - left  R hip exernally rotated   Ambulation Surface Indoor;Level   Stairs No   Ramp 4: Min assist  Min guard with rollator & prosthesis   Ramp Details (indicate  cue type and reason) PT demo, instructed in rollator walker safety.   Curb 4: Min assist  min guard with rollator & prosthesis   Curb Details (indicate cue type and reason) PT demo, instructed in rollator walker safety.. Verbal cues on sequence     Prosthetics   Prosthetic Care Comments  Increase wear to 5hrs 2x/day initiating with arising & ending with getting ready for bed. Changing shoes with prosthesis.    Current prosthetic wear tolerance (days/week)  daily   Current prosthetic wear tolerance (#hours/day)  increase to 5 hrs 2x/day, dry PRN   Residual limb condition  intact, no issues   Education Provided Residual limb care;Correct ply sock  adjustment;Proper wear schedule/adjustment;Proper weight-bearing schedule/adjustment;Skin check;Other (comment)   Person(s) Educated Patient   Education Method Explanation;Verbal cues   Education Method Verbalized understanding;Returned demonstration;Verbal cues required;Needs further instruction   Donning Prosthesis Supervision  verbal cues                 PT Education - 04/09/16 0930    Education Details increasing activity level with high frequency of short walks room to room, 4-6 medium distances (in/out house or restaurant) & 1-2 long walks to her max tolerance per day. Rollator walker use & ht adjustment.    Person(s) Educated Patient   Methods Explanation;Demonstration;Verbal cues   Comprehension Verbalized understanding;Verbal cues required;Need further instruction          PT Short Term Goals - 04/02/16 1042      PT SHORT TERM GOAL #1   Title Patient demonstrates proper donning & verbalizes proper care. (Target Date: 04/19/2016)   Time 1   Period Months   Status On-going     PT SHORT TERM GOAL #2   Title Patient tolerates wear >10hrs total wear/day without skin issues. (Target Date: 04/19/2016)   Time 1   Period Months   Status On-going     PT SHORT TERM GOAL #3   Title Patient ambulates 300' with RW & prosthesis modified independent. (Target Date: 04/19/2016)   Time 1   Period Months   Status On-going     PT SHORT TERM GOAL #4   Title Pateint negotiates ramps, curbs & stairs (2 rails) with RW & prosthesis modified independent. (Target Date: 04/19/2016)   Time 1   Period Months   Status On-going     PT SHORT TERM GOAL #5   Title Patient reaches 7" anteriorly & to floor without balance loss with supervision. (Target Date: 04/19/2016)   Time 1   Period Months   Status On-going           PT Long Term Goals - 04/02/16 1043      PT LONG TERM GOAL #1   Title Patient verbalizes & demonstrates understanding of prosthetic care to enable safe use of  prosthesis. (Target Date: 05/17/2016)   Time 2   Period Months   Status On-going     PT LONG TERM GOAL #2   Title Patient tolerates wear >90% of awake hours without skin integrity issues or limb tenderness to enable function throughout her day. (Target Date: 05/17/2016)   Time 2   Period Months   Status On-going     PT LONG TERM GOAL #3   Title Berg Balance >/= 45/56 to indicate lower fall risk. (Target Date: 05/17/2016)   Time 2   Period Months   Status On-going     PT LONG TERM GOAL #4   Title Patient ambulates 500' with LRAD &  prosthesis including grass modified indepedent to enable function in community. (Target Date: 03/17/2016)   Time 2   Period Months   Status On-going     PT LONG TERM GOAL #5   Title Patient negotiates ramps, curbs & stairs (1 rail) modified independent to enable community access. (Target Date: 05/17/2016)   Time 2   Period Months   Status On-going               Plan - 04/09/16 1027    Clinical Impression Statement Patient's gait appears more fluent with rollator walker than std RW. She has a general understanding of rollator walker safety. Patient does not appear to have any injuries from her fall.    Rehab Potential Good   PT Frequency 2x / week   PT Duration Other (comment)  9 weeks (60 days)   PT Treatment/Interventions ADLs/Self Care Home Management;Gait training;Stair training;Functional mobility training;Therapeutic activities;Therapeutic exercise;Balance training;Neuromuscular re-education;Patient/family education;Prosthetic Training   PT Next Visit Plan continued gait/barriers (practice stairs with 1 rail and wall on opposite side to better simulate home enviroment)  with Rollator walker/prosthesis, prosthetic management, initiate balance activites   Consulted and Agree with Plan of Care Patient      Patient will benefit from skilled therapeutic intervention in order to improve the following deficits and impairments:  Abnormal gait,  Decreased activity tolerance, Decreased balance, Decreased endurance, Decreased knowledge of use of DME, Decreased mobility, Decreased strength, Postural dysfunction, Prosthetic Dependency, Pain  Visit Diagnosis: Other abnormalities of gait and mobility  Unsteadiness on feet  Muscle weakness (generalized)  Other symptoms and signs involving the musculoskeletal system     Problem List Patient Active Problem List   Diagnosis Date Noted  . Transaminitis 12/06/2015  . Acute on chronic renal failure ([place]) 12/06/2015  . History of renal transplant 12/06/2015  . UTI (lower urinary tract infection) 12/06/2015  . Acute cholecystitis 12/06/2015  . Below knee amputation status ([place]) 11/29/2015  . Asystole ([place])   . S/P unilateral BKA (below knee amputation) ([place])   . Pericardial effusion   . Type 2 diabetes mellitus with peripheral neuropathy ([facility])   . Chronic kidney disease, stage IV (severe) ([place])   . Essential hypertension   . Anemia of chronic disease   . Acute blood loss anemia   . Gangrene associated with diabetes mellitus ([place]) 10/13/2015  . Critical lower limb ischemia 10/05/2015  . PAD (peripheral artery disease) ([place]) 09/18/2015  . Leukopenia 08/22/2014  . Protein-calorie malnutrition, severe ([place]) 12/24/2012  . Gastric ulcer 12/24/2012  . Mechanical complication of other vascular device, implant, and graft 09/22/2012  . End stage renal disease ([place]) 10/23/2011  . Physical deconditioning 10/09/2011  . HTN (hypertension), malignant 09/27/2011  . Chronic kidney disease, stage 4, severely decreased GFR ([facility]) 09/27/2011  . PNA (pneumonia) 09/27/2011  . Acute respiratory failure with hypoxia ([place]) 09/27/2011  . VOMITING 12/06/2009  . GOUT 12/21/2008  . Uncontrolled type 2 diabetes mellitus with peripheral artery disease ([place]) 08/14/2006  . HYPERLIPIDEMIA 08/14/2006  . OBESITY, NOS 08/14/2006  . FIBROADENOSIS, BREAST 08/14/2006  . Irregular menstrual cycle 08/14/2006     Yuliza Cara PT, DPT 04/09/2016, 10:41 AM  [facility] [address] [place], [place], [address] Phone: [phone]   Fax:  [phone]  Name: [name] MRN: [id number] Date of Birth: 27-Jun-1970

## 2016-04-12 ENCOUNTER — Encounter (HOSPITAL_COMMUNITY)
Admission: RE | Admit: 2016-04-12 | Discharge: 2016-04-12 | Disposition: A | Discharge status: Home / Self Care | Payer: Medicare Other | Source: Ambulatory Visit | Attending: Nephrology | Admitting: Nephrology

## 2016-04-12 ENCOUNTER — Ambulatory Visit: Payer: Medicare Other | Admitting: Physical Therapy

## 2016-04-12 DIAGNOSIS — R2689 Other abnormalities of gait and mobility: Secondary | ICD-10-CM

## 2016-04-12 DIAGNOSIS — R2681 Unsteadiness on feet: Secondary | ICD-10-CM

## 2016-04-12 DIAGNOSIS — R29898 Other symptoms and signs involving the musculoskeletal system: Secondary | ICD-10-CM

## 2016-04-12 DIAGNOSIS — M6281 Muscle weakness (generalized): Secondary | ICD-10-CM

## 2016-04-12 DIAGNOSIS — D509 Iron deficiency anemia, unspecified: Secondary | ICD-10-CM | POA: Diagnosis not present

## 2016-04-12 DIAGNOSIS — N184 Chronic kidney disease, stage 4 (severe): Secondary | ICD-10-CM

## 2016-04-12 LAB — POCT HEMOGLOBIN-HEMACUE: Hemoglobin: 12.1 g/dL (ref 12.0–15.0)

## 2016-04-12 MED ORDER — EPOETIN ALFA 10000 UNIT/ML IJ SOLN: 10000.0000 [IU] | INTRAMUSCULAR | Status: DC

## 2016-04-12 NOTE — Therapy (Signed)
[facility] [address] [place], [place], [address] Phone: [phone]   Fax:  [phone]  Physical Therapy Treatment  Patient Details  Name: [name] MRN: [id number] Date of Birth: 1971-02-05 Referring Provider: Meridee Score, MD  Encounter Date: 04/12/2016      PT End of Session - 04/12/16 1043    Visit Number 8   Number of Visits 18   Date for PT Re-Evaluation 05/17/16   Authorization Type Medicare G-Code & progress note required   PT Start Time 0930   PT Stop Time 1015   PT Time Calculation (min) 45 min   Equipment Utilized During Treatment Gait belt   Activity Tolerance Patient tolerated treatment well;Patient limited by fatigue   Behavior During Therapy [facility] for tasks assessed/performed      Past Medical History:  Diagnosis Date  . Anemia   . CHF (congestive heart failure) ([place])   . Critical ischemia of lower extremity hospitalized 10/05/2015   right  . Diabetic neuropathy ([place])   . ESRD (end stage renal disease) on dialysis ([place]) 09/28/2011-01/23/2013   M-W-F; [place]  . GERD (gastroesophageal reflux disease)   . High cholesterol   . History of blood transfusion    related to "kidney transplant"  . Hypertension    sees Dr. Carolin Guernsey  . Metabolic bone disease   . Peripheral vascular disease ([place])   . Pneumonia 09/27/2011  . Retinopathy   . Type II diabetes mellitus ([place])    "controlled with diet"    Past Surgical History:  Procedure Laterality Date  . AMPUTATION Right 10/13/2015   Procedure: Right Transmetatarsal Amputation;  Surgeon: Newt Minion, MD;  Location: [place];  Service: Orthopedics;  Laterality: Right;  . AMPUTATION Right 11/29/2015   Procedure: RIGHT BELOW KNEE AMPUTATION;  Surgeon: Newt Minion, MD;  Location: [place];  Service: Orthopedics;  Laterality: Right;  . AV FISTULA PLACEMENT  10/04/2011   Procedure: INSERTION OF ARTERIOVENOUS (AV) GORE-TEX GRAFT ARM;  Surgeon:  Angelia Mould, MD;  Location: [place];  Service: Vascular;  Laterality: Left;  Insertion left upper arm Arteriovenous goretex graft  . AV FISTULA PLACEMENT  10/29/2011   Procedure: ARTERIOVENOUS (AV) FISTULA CREATION;  Surgeon: Angelia Mould, MD;  Location: [facility] OR;  Service: Vascular;  Laterality: Right;  Creation Right Arteriovenous Fistula   . [place] REMOVAL  10/04/2011   Procedure: REMOVAL OF ARTERIOVENOUS GORETEX GRAFT ([place]);  Surgeon: Elam Dutch, MD;  Location: [employer];  Service: Vascular;  Laterality: Left;  . CHOLECYSTECTOMY N/A 12/08/2015   Procedure: LAPAROSCOPIC CHOLECYSTECTOMY WITH INTRAOPERATIVE CHOLANGIOGRAM;  Surgeon: Stark Klein, MD;  Location: [place];  Service: General;  Laterality: N/A;  . ESOPHAGOGASTRODUODENOSCOPY N/A 12/24/2012   Procedure: ESOPHAGOGASTRODUODENOSCOPY (EGD);  Surgeon: Milus Banister, MD;  Location: [place];  Service: Endoscopy;  Laterality: N/A;  . FINGER AMPUTATION Right 02/26/2013   "3rd finger"  . INSERTION OF DIALYSIS CATHETER  10/04/2011   Procedure: INSERTION OF DIALYSIS CATHETER;  Surgeon: Angelia Mould, MD;  Location: [place];  Service: Vascular;  Laterality: Right;  insertion of dialysis catheter right internal jugular  . INSERTION OF DIALYSIS CATHETER  06/23/2012   Procedure: INSERTION OF DIALYSIS CATHETER;  Surgeon: Angelia Mould, MD;  Location: [place];  Service: Vascular;  Laterality: N/A;  Ultrasound guided  . KIDNEY TRANSPLANT  01/24/2013  . PATCH ANGIOPLASTY  06/23/2012   Procedure: PATCH ANGIOPLASTY;  Surgeon: Angelia Mould, MD;  Location: [facility]  OR;  Service: Vascular;  Laterality: Right;  . PERIPHERAL VASCULAR CATHETERIZATION N/A 09/19/2015   Procedure: Lower Extremity Angiography;  Surgeon: Adrian Prows, MD;  Location: [place] CV LAB;  Service: Cardiovascular;  Laterality: N/A;  . PERIPHERAL VASCULAR CATHETERIZATION N/A 09/19/2015   Procedure: Abdominal Aortogram;  Surgeon: Adrian Prows, MD;  Location: [place] CV  LAB;  Service: Cardiovascular;  Laterality: N/A;  . PERIPHERAL VASCULAR CATHETERIZATION Right 09/19/2015   Procedure: Peripheral Vascular Intervention;  Surgeon: Adrian Prows, MD;  Location: [place] CV LAB;  Service: Cardiovascular;  Laterality: Right;  Right Common  Iliac  . PERIPHERAL VASCULAR CATHETERIZATION N/A 10/06/2015   Procedure: Lower Extremity Angiography;  Surgeon: Adrian Prows, MD;  Location: [place] CV LAB;  Service: Cardiovascular;  Laterality: N/A;  . PERIPHERAL VASCULAR CATHETERIZATION  10/06/2015   Procedure: Peripheral Vascular Intervention;  Surgeon: Adrian Prows, MD;  Location: [place] CV LAB;  Service: Cardiovascular;;  REIA  Omnilink 6.0x29, innova 7x80  RSFA innova 5x80  . REVISON OF ARTERIOVENOUS FISTULA  06/23/2012   Procedure: REVISON OF ARTERIOVENOUS FISTULA;  Surgeon: Angelia Mould, MD;  Location: [place];  Service: Vascular;  Laterality: Right;  Ultrasound guided  . SHUNTOGRAM N/A 04/06/2012   Procedure: Earney Mallet;  Surgeon: Angelia Mould, MD;  Location: [facility] CATH LAB;  Service: Cardiovascular;  Laterality: N/A;    There were no vitals filed for this visit.      Subjective Assessment - 04/12/16 0933    Subjective Patient reports she has drove here today. She has been "driving all over town". She needs assistance getting in and out of the car. She has ordered a left foot accelerator to make driving easier. She spent some time this morning walking around the house w/o any assistive device.   Pertinent History IDDM2 with neuropathy & retinopathy, kidney transplant (01/24/2013), respiratory failure 3X (09/27/2011, 10/14/2015 & 12/06/2015) hypoxia, HTN, CHF, PVD, gout (pt denies),    Limitations Lifting;Standing;Walking   Patient Stated Goals She wants to return to "normal" walking, driving, going out in community, working, going concerts, amusement parks.   Currently in Pain? No/denies            [facility] Adult PT Treatment/Exercise - 04/12/16 0955       Transfers   Transfers Sit to Stand;Stand to Sit   Sit to Stand 5: Supervision;With upper extremity assist;With armrests;From chair/3-in-1   Sit to Stand Details (indicate cue type and reason) occasional cues on brake management on rollator   Stand to Sit 5: Supervision;With upper extremity assist;With armrests;To chair/3-in-1     Ambulation/Gait   Ambulation/Gait Yes   Ambulation/Gait Assistance 5: Supervision   Ambulation/Gait Assistance Details occasional cues on rollator proximity with gait   Ambulation Distance (Feet) 365 Feet   Assistive device Rollator;Prosthesis   Gait Pattern Step-through pattern;Decreased weight shift to right;Antalgic;Narrow base of support   Ambulation Surface Indoor;Level   Stairs Yes   Stairs Assistance 4: Min guard;5: Supervision   Stairs Assistance Details (indicate cue type and reason) Patient was supervision with two hand rails, but min guard when simulating home enviroment of one rail left and wall on right. VC to decend leading with the prosthetic side.   Stair Management Technique Two rails;One rail Left;Step to pattern;Forwards   Number of Stairs 4  x3 reps   Gait Comments Therapist stopped ambulation to have patient to add additional sock/s due to patients sudden lack of weight shifting to the R/onset of antalgic gait pattern with pt reporting feeling  pressure at area of limb. All of these improved after sock ply increased.                       High Level Balance   High Level Balance Comments All balance activities performed in parallel bars. Min guard for lateral rocker boards; 3x30 seconds; VC required to bend knees to weight shift. Min assist for SLS on level surface; LLE, not on R due to pt pain; with single UE support 3x30 sec; 7 second holds w/o UE support. Min guard for tandem stance; on level surface; 3x30; with periodic single UE support as needed for balance.      Prosthetics   Prosthetic Care Comments  Patient states she is wearing the  prosthesis all day with no breaks. Reminded to remove prosthesis periodically to dry off risidual limb. Patient advised to adjust socks now that she is ambulating more. Pt reports her dad has been helping her get her rollator in/out of car, if she is by herself she user her regular RW which she can self manage. Will need to pratice loading rollator in/out of car. Pt also educated on need for gradual decrease in AD restrictions for gradual build up of weight bearing on prosthesis. In other words going from rollator to nothing is a "big jump".  Discussed use of cane as a bridge device, pt agreeable to this. Will plan to initate cane with gait as well.   Current prosthetic wear tolerance (days/week)  daily   Current prosthetic wear tolerance (#hours/day)  all awake hours, drying prn   Residual limb condition  intact, no issues   Education Provided Residual limb care;Correct ply sock adjustment;Proper wear schedule/adjustment;Proper weight-bearing schedule/adjustment;Skin check;Other (comment)   Person(s) Educated Patient   Education Method Explanation;Verbal cues   Education Method Verbalized understanding;Needs further instruction;Returned demonstration   Donning Prosthesis Modified independent (device/increased time)   Doffing Prosthesis Modified independent (device/increased time)            PT Education - 04/12/16 1042    Education provided Yes   Education Details increase sock ply now that she is ambulating more.    Person(s) Educated Patient   Methods Explanation;Demonstration   Comprehension Verbalized understanding;Need further instruction;Returned demonstration          PT Short Term Goals - 04/02/16 1042      PT SHORT TERM GOAL #1   Title Patient demonstrates proper donning & verbalizes proper care. (Target Date: 04/19/2016)   Time 1   Period Months   Status On-going     PT SHORT TERM GOAL #2   Title Patient tolerates wear >10hrs total wear/day without skin issues. (Target  Date: 04/19/2016)   Time 1   Period Months   Status On-going     PT SHORT TERM GOAL #3   Title Patient ambulates 300' with RW & prosthesis modified independent. (Target Date: 04/19/2016)   Time 1   Period Months   Status On-going     PT SHORT TERM GOAL #4   Title Pateint negotiates ramps, curbs & stairs (2 rails) with RW & prosthesis modified independent. (Target Date: 04/19/2016)   Time 1   Period Months   Status On-going     PT SHORT TERM GOAL #5   Title Patient reaches 7" anteriorly & to floor without balance loss with supervision. (Target Date: 04/19/2016)   Time 1   Period Months   Status On-going  PT Long Term Goals - 04/02/16 1043      PT LONG TERM GOAL #1   Title Patient verbalizes & demonstrates understanding of prosthetic care to enable safe use of prosthesis. (Target Date: 05/17/2016)   Time 2   Period Months   Status On-going     PT LONG TERM GOAL #2   Title Patient tolerates wear >90% of awake hours without skin integrity issues or limb tenderness to enable function throughout her day. (Target Date: 05/17/2016)   Time 2   Period Months   Status On-going     PT LONG TERM GOAL #3   Title Berg Balance >/= 45/56 to indicate lower fall risk. (Target Date: 05/17/2016)   Time 2   Period Months   Status On-going     PT LONG TERM GOAL #4   Title Patient ambulates 500' with LRAD & prosthesis including grass modified indepedent to enable function in community. (Target Date: 03/17/2016)   Time 2   Period Months   Status On-going     PT LONG TERM GOAL #5   Title Patient negotiates ramps, curbs & stairs (1 rail) modified independent to enable community access. (Target Date: 05/17/2016)   Time 2   Period Months   Status On-going           Plan - 04/12/16 1044    Clinical Impression Statement Patient's gait has improved since transitioning to the rollator. The ER at her hip is no longer evident. She seems encouraged and in good spirts about her progress  and ability to drive. Patient is making steady progress towards goals and should benefit from continued PT to achieve unmet goals.   Rehab Potential Good   PT Frequency 2x / week   PT Duration Other (comment)  9 weeks (60 days)   PT Treatment/Interventions ADLs/Self Care Home Management;Gait training;Stair training;Functional mobility training;Therapeutic activities;Therapeutic exercise;Balance training;Neuromuscular re-education;Patient/family education;Prosthetic Training   PT Next Visit Plan Car transfers to increase pt independence getting in/out of car, rollator management with car as well; continued prosthetic management, balance activities, gait (progress towards cane), and stairs with with 1 rail and wall on opposite side to better simulate home enviroment. prosthetic management.    Consulted and Agree with Plan of Care Patient      Patient will benefit from skilled therapeutic intervention in order to improve the following deficits and impairments:  Abnormal gait, Decreased activity tolerance, Decreased balance, Decreased endurance, Decreased knowledge of use of DME, Decreased mobility, Decreased strength, Postural dysfunction, Prosthetic Dependency, Pain  Visit Diagnosis: Other abnormalities of gait and mobility  Unsteadiness on feet  Muscle weakness (generalized)  Other symptoms and signs involving the musculoskeletal system     Problem List Patient Active Problem List   Diagnosis Date Noted  . Transaminitis 12/06/2015  . Acute on chronic renal failure ([place]) 12/06/2015  . History of renal transplant 12/06/2015  . UTI (lower urinary tract infection) 12/06/2015  . Acute cholecystitis 12/06/2015  . Below knee amputation status ([place]) 11/29/2015  . Asystole ([place])   . S/P unilateral BKA (below knee amputation) ([place])   . Pericardial effusion   . Type 2 diabetes mellitus with peripheral neuropathy ([facility])   . Chronic kidney disease, stage IV (severe) ([place])   . Essential  hypertension   . Anemia of chronic disease   . Acute blood loss anemia   . Gangrene associated with diabetes mellitus ([place]) 10/13/2015  . Critical lower limb ischemia 10/05/2015  . PAD (peripheral artery disease) ([place]) 09/18/2015  .  Leukopenia 08/22/2014  . Protein-calorie malnutrition, severe ([place]) 12/24/2012  . Gastric ulcer 12/24/2012  . Mechanical complication of other vascular device, implant, and graft 09/22/2012  . End stage renal disease ([place]) 10/23/2011  . Physical deconditioning 10/09/2011  . HTN (hypertension), malignant 09/27/2011  . Chronic kidney disease, stage 4, severely decreased GFR ([facility]) 09/27/2011  . PNA (pneumonia) 09/27/2011  . Acute respiratory failure with hypoxia ([place]) 09/27/2011  . VOMITING 12/06/2009  . GOUT 12/21/2008  . Uncontrolled type 2 diabetes mellitus with peripheral artery disease ([place]) 08/14/2006  . HYPERLIPIDEMIA 08/14/2006  . OBESITY, NOS 08/14/2006  . FIBROADENOSIS, BREAST 08/14/2006  . Irregular menstrual cycle 08/14/2006    Benjiman Core, SPTA 04/12/2016, 4:20 PM  [facility] [address] [place], [place], [address] Phone: [phone]   Fax:  [phone]  Name: [name] MRN: [id number] Date of Birth: Feb 27, 1971

## 2016-04-16 ENCOUNTER — Encounter: Payer: Self-pay | Admitting: Physical Therapy

## 2016-04-16 ENCOUNTER — Ambulatory Visit: Payer: Medicare Other | Admitting: Physical Therapy

## 2016-04-16 DIAGNOSIS — R2681 Unsteadiness on feet: Secondary | ICD-10-CM | POA: Diagnosis not present

## 2016-04-16 DIAGNOSIS — R29898 Other symptoms and signs involving the musculoskeletal system: Secondary | ICD-10-CM

## 2016-04-16 DIAGNOSIS — R2689 Other abnormalities of gait and mobility: Secondary | ICD-10-CM

## 2016-04-16 DIAGNOSIS — M6281 Muscle weakness (generalized): Secondary | ICD-10-CM

## 2016-04-17 NOTE — Therapy (Signed)
[facility] [address] [place], [place], [address] Phone: [phone]   Fax:  [phone]  Physical Therapy Treatment  Patient Details  Name: [name] MRN: [id number] Date of Birth: 03-Mar-1971 Referring Provider: Meridee Score, MD  Encounter Date: 04/16/2016      PT End of Session - 04/16/16 1609    Visit Number 9   Number of Visits 18   Date for PT Re-Evaluation 05/17/16   Authorization Type Medicare G-Code & progress note required   PT Start Time 0930   PT Stop Time 1014   PT Time Calculation (min) 44 min   Equipment Utilized During Treatment Gait belt   Activity Tolerance Patient tolerated treatment well;Patient limited by fatigue   Behavior During Therapy [facility] for tasks assessed/performed      Past Medical History:  Diagnosis Date  . Anemia   . CHF (congestive heart failure) ([place])   . Critical ischemia of lower extremity hospitalized 10/05/2015   right  . Diabetic neuropathy ([place])   . ESRD (end stage renal disease) on dialysis ([place]) 09/28/2011-01/23/2013   M-W-F; [place]  . GERD (gastroesophageal reflux disease)   . High cholesterol   . History of blood transfusion    related to "kidney transplant"  . Hypertension    sees Dr. Carolin Guernsey  . Metabolic bone disease   . Peripheral vascular disease ([place])   . Pneumonia 09/27/2011  . Retinopathy   . Type II diabetes mellitus ([place])    "controlled with diet"    Past Surgical History:  Procedure Laterality Date  . AMPUTATION Right 10/13/2015   Procedure: Right Transmetatarsal Amputation;  Surgeon: Newt Minion, MD;  Location: [place];  Service: Orthopedics;  Laterality: Right;  . AMPUTATION Right 11/29/2015   Procedure: RIGHT BELOW KNEE AMPUTATION;  Surgeon: Newt Minion, MD;  Location: [place];  Service: Orthopedics;  Laterality: Right;  . AV FISTULA PLACEMENT  10/04/2011   Procedure: INSERTION OF ARTERIOVENOUS (AV) GORE-TEX GRAFT ARM;  Surgeon:  Angelia Mould, MD;  Location: [place];  Service: Vascular;  Laterality: Left;  Insertion left upper arm Arteriovenous goretex graft  . AV FISTULA PLACEMENT  10/29/2011   Procedure: ARTERIOVENOUS (AV) FISTULA CREATION;  Surgeon: Angelia Mould, MD;  Location[facility] OR;  Service: Vascular;  Laterality: Right;  Creation Right Arteriovenous Fistula   . [place] REMOVAL  10/04/2011   Procedure: REMOVAL OF ARTERIOVENOUS GORETEX GRAFT ([place]);  Surgeon: Elam Dutch, MD;  Location: [place];  Service: Vascular;  Laterality: Left;  . CHOLECYSTECTOMY N/A 12/08/2015   Procedure: LAPAROSCOPIC CHOLECYSTECTOMY WITH INTRAOPERATIVE CHOLANGIOGRAM;  Surgeon: Stark Klein, MD;  Location: [place];  Service: General;  Laterality: N/A;  . ESOPHAGOGASTRODUODENOSCOPY N/A 12/24/2012   Procedure: ESOPHAGOGASTRODUODENOSCOPY (EGD);  Surgeon: Milus Banister, MD;  Location: [place];  Service: Endoscopy;  Laterality: N/A;  . FINGER AMPUTATION Right 02/26/2013   "3rd finger"  . INSERTION OF DIALYSIS CATHETER  10/04/2011   Procedure: INSERTION OF DIALYSIS CATHETER;  Surgeon: Angelia Mould, MD;  Location: [place];  Service: Vascular;  Laterality: Right;  insertion of dialysis catheter right internal jugular  . INSERTION OF DIALYSIS CATHETER  06/23/2012   Procedure: INSERTION OF DIALYSIS CATHETER;  Surgeon: Angelia Mould, MD;  Location: [place];  Service: Vascular;  Laterality: N/A;  Ultrasound guided  . KIDNEY TRANSPLANT  01/24/2013  . PATCH ANGIOPLASTY  06/23/2012   Procedure: PATCH ANGIOPLASTY;  Surgeon: Angelia Mould, MD;  Location: [facility]  OR;  Service: Vascular;  Laterality: Right;  . PERIPHERAL VASCULAR CATHETERIZATION N/A 09/19/2015   Procedure: Lower Extremity Angiography;  Surgeon: Adrian Prows, MD;  Location: [place] CV LAB;  Service: Cardiovascular;  Laterality: N/A;  . PERIPHERAL VASCULAR CATHETERIZATION N/A 09/19/2015   Procedure: Abdominal Aortogram;  Surgeon: Adrian Prows, MD;  Location: [place] CV  LAB;  Service: Cardiovascular;  Laterality: N/A;  . PERIPHERAL VASCULAR CATHETERIZATION Right 09/19/2015   Procedure: Peripheral Vascular Intervention;  Surgeon: Adrian Prows, MD;  Location: [place] CV LAB;  Service: Cardiovascular;  Laterality: Right;  Right Common  Iliac  . PERIPHERAL VASCULAR CATHETERIZATION N/A 10/06/2015   Procedure: Lower Extremity Angiography;  Surgeon: Adrian Prows, MD;  Location: [place] CV LAB;  Service: Cardiovascular;  Laterality: N/A;  . PERIPHERAL VASCULAR CATHETERIZATION  10/06/2015   Procedure: Peripheral Vascular Intervention;  Surgeon: Adrian Prows, MD;  Location: [place] CV LAB;  Service: Cardiovascular;;  REIA  Omnilink 6.0x29, innova 7x80  RSFA innova 5x80  . REVISON OF ARTERIOVENOUS FISTULA  06/23/2012   Procedure: REVISON OF ARTERIOVENOUS FISTULA;  Surgeon: Angelia Mould, MD;  Location: [place];  Service: Vascular;  Laterality: Right;  Ultrasound guided  . SHUNTOGRAM N/A 04/06/2012   Procedure: [place]ney Mallet;  Surgeon: Angelia Mould, MD;  Location[facility];  Service: Cardiovascular;  Laterality: N/A;    There were no vitals filed for this visit.      Subjective Assessment - 04/16/16 0933    Subjective She is wearing prosthesis from time she gets out of shower first thing in morning to getting ready for bed.    Pertinent History IDDM2 with neuropathy & retinopathy, kidney transplant (01/24/2013), respiratory failure 3X (09/27/2011, 10/14/2015 & 12/06/2015) hypoxia, HTN, CHF, PVD, gout (pt denies),    Limitations Lifting;Standing;Walking   Patient Stated Goals She wants to return to "normal" walking, driving, going out in community, working, going concerts, amusement parks.   Currently in Pain? No/denies                         [facility] Adult PT Treatment/Exercise - 04/16/16 1617      Transfers   Transfers Sit to Stand;Stand to Sit   Sit to Stand 5: Supervision;With upper extremity assist;With armrests;From chair/3-in-1;6:  Modified independent (Device/Increase time)  modified independent w/rollator, SBA no device   Stand to Sit 5: Supervision;With upper extremity assist;With armrests;To chair/3-in-1;6: Modified independent (Device/Increase time)  Mod Indep with rollator & SBA no device to stabilize   Stand Pivot Transfers 6: Modified independent (Device/Increase time)  to/from rollator seat     Ambulation/Gait   Ambulation/Gait Yes   Ambulation/Gait Assistance 6: Modified independent (Device/Increase time);4: Min assist  Mod indep rollator & MinA cane   Ambulation Distance (Feet) 350 Feet  350' with rollator & 150' X 2 with cane   Assistive device Rollator;Prosthesis   Gait Pattern Step-through pattern;Decreased weight shift to right;Antalgic;Narrow base of support   Ambulation Surface Indoor;Level   Stairs Yes   Stairs Assistance 4: Min guard;6: Modified independent (Device/Increase time);5: Supervision  Mod Indep with 2hands 1 rail step-to; min guard/SBA cane   Stairs Assistance Details (indicate cue type and reason) verbal, demo & tactile cues on technique for reciprocal pattern   Stair Management Technique Step to pattern;Forwards;One rail Right;Two rails;Alternating pattern   Number of Stairs 4  1rep step-to & 3 reps reciprocal   Ramp 4: Min assist;6: Modified independent (Device)  Mod Indep rollator & MinA with  cane   Ramp Details (indicate cue type and reason) verbal & tactile cues on technique with cane   Curb 4: Min assist;6: Modified independent (Device/increase time)  Mod Indep rollator & MinA cane   Curb Details (indicate cue type and reason) demo, verbal & tactile cues on technique with cane including foot position & step thru for momentum   Gait Comments --     High Level Balance   High Level Balance Comments reaching 7" without UE support with supervision.      Self-Care   Self-Care Lifting   Lifting PT demo lifing technique with prosthesis & pt return demo understanding with  supervision without UE support.     Prosthetics   Prosthetic Care Comments  PT reviewed signs of sweating with need to dry limb/liner. PT recommended drying limb/liner mid-day and prn.    Current prosthetic wear tolerance (days/week)  daily   Current prosthetic wear tolerance (#hours/day)  all awake hours, drying mid-day & prn   Residual limb condition  intact, no issues   Education Provided Residual limb care;Correct ply sock adjustment;Proper wear schedule/adjustment;Proper weight-bearing schedule/adjustment;Skin check;Other (comment)   Person(s) Educated Patient   Education Method Explanation;Demonstration;Tactile cues;Verbal cues   Education Method Verbalized understanding;Returned demonstration;Tactile cues required;Verbal cues required;Needs further instruction                  PT Short Term Goals - 04/16/16 1610      PT SHORT TERM GOAL #1   Title Patient demonstrates proper donning & verbalizes proper care. (Target Date: 04/19/2016)   Baseline MET 04/16/2016   Time 1   Period Months   Status Achieved     PT SHORT TERM GOAL #2   Title Patient tolerates wear >10hrs total wear/day without skin issues. (Target Date: 04/19/2016)   Baseline MET 04/16/2016   Time 1   Period Months   Status Achieved     PT SHORT TERM GOAL #3   Title Patient ambulates 300' with RW & prosthesis modified independent. (Target Date: 04/19/2016)   Baseline MET 04/16/2016   Time 1   Period Months   Status Achieved     PT SHORT TERM GOAL #4   Title Pateint negotiates ramps, curbs & stairs (2 rails) with RW & prosthesis modified independent. (Target Date: 04/19/2016)   Baseline MET 04/16/2016   Time 1   Period Months   Status Achieved     PT SHORT TERM GOAL #5   Title Patient reaches 7" anteriorly & to floor without balance loss with supervision. (Target Date: 04/19/2016)   Baseline MET 04/16/2016   Time 1   Period Months   Status Achieved           PT Long Term Goals - 04/02/16  1043      PT LONG TERM GOAL #1   Title Patient verbalizes & demonstrates understanding of prosthetic care to enable safe use of prosthesis. (Target Date: 05/17/2016)   Time 2   Period Months   Status On-going     PT LONG TERM GOAL #2   Title Patient tolerates wear >90% of awake hours without skin integrity issues or limb tenderness to enable function throughout her day. (Target Date: 05/17/2016)   Time 2   Period Months   Status On-going     PT LONG TERM GOAL #3   Title Berg Balance >/= 45/56 to indicate lower fall risk. (Target Date: 05/17/2016)   Time 2   Period Months   Status On-going  PT LONG TERM GOAL #4   Title Patient ambulates 500' with LRAD & prosthesis including grass modified indepedent to enable function in community. (Target Date: 03/17/2016)   Time 2   Period Months   Status On-going     PT LONG TERM GOAL #5   Title Patient negotiates ramps, curbs & stairs (1 rail) modified independent to enable community access. (Target Date: 05/17/2016)   Time 2   Period Months   Status On-going               Plan - 04/16/16 1611    Clinical Impression Statement Pt met 5 of 5 STGs. She appears safe with rollator walker & prosthesis gait but needs additional skilled care to progress to cane or less restrictive assistive device.    Rehab Potential Good   PT Frequency 2x / week   PT Duration Other (comment)  9 weeks (60 days)   PT Treatment/Interventions ADLs/Self Care Home Management;Gait training;Stair training;Functional mobility training;Therapeutic activities;Therapeutic exercise;Balance training;Neuromuscular re-education;Patient/family education;Prosthetic Training   PT Next Visit Plan Do G-Code, gait with cane including negotiating barriers & outdoors, balance activities   Consulted and Agree with Plan of Care Patient      Patient will benefit from skilled therapeutic intervention in order to improve the following deficits and impairments:  Abnormal gait,  Decreased activity tolerance, Decreased balance, Decreased endurance, Decreased knowledge of use of DME, Decreased mobility, Decreased strength, Postural dysfunction, Prosthetic Dependency, Pain  Visit Diagnosis: Other abnormalities of gait and mobility  Unsteadiness on feet  Muscle weakness (generalized)  Other symptoms and signs involving the musculoskeletal system     Problem List Patient Active Problem List   Diagnosis Date Noted  . Transaminitis 12/06/2015  . Acute on chronic renal failure ([place]) 12/06/2015  . History of renal transplant 12/06/2015  . UTI (lower urinary tract infection) 12/06/2015  . Acute cholecystitis 12/06/2015  . Below knee amputation status ([place]) 11/29/2015  . Asystole ([place])   . S/P unilateral BKA (below knee amputation) ([place])   . Pericardial effusion   . Type 2 diabetes mellitus with peripheral neuropathy ([facility])   . Chronic kidney disease, stage IV (severe) ([place])   . Essential hypertension   . Anemia of chronic disease   . Acute blood loss anemia   . Gangrene associated with diabetes mellitus ([place]) 10/13/2015  . Critical lower limb ischemia 10/05/2015  . PAD (peripheral artery disease) ([place]) 09/18/2015  . Leukopenia 08/22/2014  . Protein-calorie malnutrition, severe ([place]) 12/24/2012  . Gastric ulcer 12/24/2012  . Mechanical complication of other vascular device, implant, and graft 09/22/2012  . End stage renal disease ([place]) 10/23/2011  . Physical deconditioning 10/09/2011  . HTN (hypertension), malignant 09/27/2011  . Chronic kidney disease, stage 4, severely decreased GFR ([facility]) 09/27/2011  . PNA (pneumonia) 09/27/2011  . Acute respiratory failure with hypoxia ([place]) 09/27/2011  . VOMITING 12/06/2009  . GOUT 12/21/2008  . Uncontrolled type 2 diabetes mellitus with peripheral artery disease ([name]) 08/14/2006  . HYPERLIPIDEMIA 08/14/2006  . OBESITY, NOS 08/14/2006  . FIBROADENOSIS, BREAST 08/14/2006  . Irregular menstrual cycle 08/14/2006     Marwin Primmer PT, DPT 04/17/2016, 6:21 AM  [facility] [address] [place], [place], [address] Phone: [phone]   Fax:  [phone]  Name: [name] MRN: [id number] Date of Birth: 1970-09-29

## 2016-04-19 ENCOUNTER — Encounter: Payer: Self-pay | Admitting: Physical Therapy

## 2016-04-19 ENCOUNTER — Ambulatory Visit: Payer: Medicare Other | Attending: Orthopedic Surgery | Admitting: Physical Therapy

## 2016-04-19 DIAGNOSIS — M6281 Muscle weakness (generalized): Secondary | ICD-10-CM | POA: Diagnosis not present

## 2016-04-19 DIAGNOSIS — R2689 Other abnormalities of gait and mobility: Secondary | ICD-10-CM | POA: Diagnosis not present

## 2016-04-19 DIAGNOSIS — R29898 Other symptoms and signs involving the musculoskeletal system: Secondary | ICD-10-CM | POA: Diagnosis not present

## 2016-04-19 DIAGNOSIS — R2681 Unsteadiness on feet: Secondary | ICD-10-CM

## 2016-04-19 NOTE — Therapy (Signed)
[facility] [address] [place], [place], [address] Phone: [phone]   Fax:  [phone]  Physical Therapy Treatment  Patient Details  Name: [name] MRN: [id number] Date of Birth: [birth year] Referring Provider: Meridee Score, MD  Encounter Date: 04/19/2016      PT End of Session - 04/19/16 1124    Visit Number 10   Number of Visits 18   Date for PT Re-Evaluation 05/17/16   Authorization Type Medicare G-Code & progress note required   PT Start Time 0930   PT Stop Time 1017   PT Time Calculation (min) 47 min   Equipment Utilized During Treatment Gait belt   Activity Tolerance Patient tolerated treatment well;Patient limited by pain   Behavior During Therapy [facility] for tasks assessed/performed      Past Medical History:  Diagnosis Date  . Anemia   . CHF (congestive heart failure) ([place])   . Critical ischemia of lower extremity hospitalized 10/05/2015   right  . Diabetic neuropathy ([place])   . ESRD (end stage renal disease) on dialysis ([place]) 09/28/2011-01/23/2013   M-W-F; [facility]  . GERD (gastroesophageal reflux disease)   . High cholesterol   . History of blood transfusion    related to "kidney transplant"  . Hypertension    sees Dr. Carolin Guernsey  . Metabolic bone disease   . Peripheral vascular disease ([place])   . Pneumonia 09/27/2011  . Retinopathy   . Type II diabetes mellitus ([place])    "controlled with diet"    Past Surgical History:  Procedure Laterality Date  . AMPUTATION Right 10/13/2015   Procedure: Right Transmetatarsal Amputation;  Surgeon: Newt Minion, MD;  Location: [facility];  Service: Orthopedics;  Laterality: Right;  . AMPUTATION Right 11/29/2015   Procedure: RIGHT BELOW KNEE AMPUTATION;  Surgeon: Newt Minion, MD;  Location: [place];  Service: Orthopedics;  Laterality: Right;  . AV FISTULA PLACEMENT  10/04/2011   Procedure: INSERTION OF ARTERIOVENOUS (AV) GORE-TEX GRAFT ARM;  Surgeon:  Angelia Mould, MD;  Location: [facility];  Service: Vascular;  Laterality: Left;  Insertion left upper arm Arteriovenous goretex graft  . AV FISTULA PLACEMENT  10/29/2011   Procedure: ARTERIOVENOUS (AV) FISTULA CREATION;  Surgeon: Angelia Mould, MD;  Location: [facility] OR;  Service: Vascular;  Laterality: Right;  Creation Right Arteriovenous Fistula   . [place] REMOVAL  10/04/2011   Procedure: REMOVAL OF ARTERIOVENOUS GORETEX GRAFT ([place]);  Surgeon: Elam Dutch, MD;  Location: [facility];  Service: Vascular;  Laterality: Left;  . CHOLECYSTECTOMY N/A 12/08/2015   Procedure: LAPAROSCOPIC CHOLECYSTECTOMY WITH INTRAOPERATIVE CHOLANGIOGRAM;  Surgeon: Stark Klein, MD;  Location: [facility];  Service: General;  Laterality: N/A;  . ESOPHAGOGASTRODUODENOSCOPY N/A 12/24/2012   Procedure: ESOPHAGOGASTRODUODENOSCOPY (EGD);  Surgeon: Milus Banister, MD;  Location: [facility];  Service: Endoscopy;  Laterality: N/A;  . FINGER AMPUTATION Right 02/26/2013   "3rd finger"  . INSERTION OF DIALYSIS CATHETER  10/04/2011   Procedure: INSERTION OF DIALYSIS CATHETER;  Surgeon: Angelia Mould, MD;  Location: [facility];  Service: Vascular;  Laterality: Right;  insertion of dialysis catheter right internal jugular  . INSERTION OF DIALYSIS CATHETER  06/23/2012   Procedure: INSERTION OF DIALYSIS CATHETER;  Surgeon: Angelia Mould, MD;  Location: [facility];  Service: Vascular;  Laterality: N/A;  Ultrasound guided  . KIDNEY TRANSPLANT  01/24/2013  . PATCH ANGIOPLASTY  06/23/2012   Procedure: PATCH ANGIOPLASTY;  Surgeon: Angelia Mould, MD;  Location: [facility]  OR;  Service: Vascular;  Laterality: Right;  . PERIPHERAL VASCULAR CATHETERIZATION N/A 09/19/2015   Procedure: Lower Extremity Angiography;  Surgeon: Adrian Prows, MD;  Location: [facility] CV LAB;  Service: Cardiovascular;  Laterality: N/A;  . PERIPHERAL VASCULAR CATHETERIZATION N/A 09/19/2015   Procedure: Abdominal Aortogram;  Surgeon: Adrian Prows, MD;  Location: [place] CV  LAB;  Service: Cardiovascular;  Laterality: N/A;  . PERIPHERAL VASCULAR CATHETERIZATION Right 09/19/2015   Procedure: Peripheral Vascular Intervention;  Surgeon: Adrian Prows, MD;  Location: [place] CV LAB;  Service: Cardiovascular;  Laterality: Right;  Right Common  Iliac  . PERIPHERAL VASCULAR CATHETERIZATION N/A 10/06/2015   Procedure: Lower Extremity Angiography;  Surgeon: Adrian Prows, MD;  Location: [place] CV LAB;  Service: Cardiovascular;  Laterality: N/A;  . PERIPHERAL VASCULAR CATHETERIZATION  10/06/2015   Procedure: Peripheral Vascular Intervention;  Surgeon: Adrian Prows, MD;  Location: [place] CV LAB;  Service: Cardiovascular;;  REIA  Omnilink 6.0x29, innova 7x80  RSFA innova 5x80  . REVISON OF ARTERIOVENOUS FISTULA  06/23/2012   Procedure: REVISON OF ARTERIOVENOUS FISTULA;  Surgeon: Angelia Mould, MD;  Location: [facility];  Service: Vascular;  Laterality: Right;  Ultrasound guided  . SHUNTOGRAM N/A 04/06/2012   Procedure: Earney Mallet;  Surgeon: Angelia Mould, MD;  Location: [facility] CATH LAB;  Service: Cardiovascular;  Laterality: N/A;    There were no vitals filed for this visit.      Subjective Assessment - 04/19/16 0933    Subjective Pt arrived in rollator. She reports she took SCAT here and they raised her and her rollator into the bus using the [facility] lift.   Patient is accompained by: Family member   Pertinent History IDDM2 with neuropathy & retinopathy, kidney transplant (01/24/2013), respiratory failure 3X (09/27/2011, 10/14/2015 & 12/06/2015) hypoxia, HTN, CHF, PVD, gout (pt denies),    Limitations Lifting;Standing;Walking   Patient Stated Goals She wants to return to "normal" walking, driving, going out in community, working, going concerts, amusement parks.   Currently in Pain? No/denies           Prosthetics Assessment - 04/19/16 1117      Prosthetics   Prosthetic Care Comments  Pt reminded of need to dry limb through out the day.    Current prosthetic wear  tolerance (days/week)  daily   Current prosthetic wear tolerance (#hours/day)  all awake hours, drying mid-day & prn   Residual limb condition  intact, no issues      04/19/16 0955  Transfers  Transfers Sit to Stand;Stand to Sit  Sit to Stand With upper extremity assist;With armrests;From chair/3-in-1;6: Modified independent (Device/Increase time)  Stand to Sit 5: Supervision;With upper extremity assist;With armrests;To chair/3-in-1;6: Modified independent (Device/Increase time)  Ambulation/Gait  Ambulation/Gait Yes  Ambulation/Gait Assistance 4: Min guard;4: Min assist;6: Modified independent (Device/Increase time)  Ambulation/Gait Assistance Details Min guard with single tip cane; min assist for balance on curb; hand held min assist due to pt pain. Modified independent when using rollator  Ambulation Distance (Feet) 230 Feet (with stright cane)  Assistive device Straight cane;1 person hand held assist;Prostheses  Gait Pattern Step-through pattern;Decreased weight shift to right;Antalgic;Narrow base of support  Ambulation Surface Level;Indoor;Outdoor;Grass  Curb 4: Min assist  Curb Details (indicate cue type and reason) VC required for technique and sequencing when negociating curb. LOB x1   Gait Comments Started session ambulating with cane. Progressed to outside surfaces and grass. While outside pt's pain during ambulation increased to a 5/10. Hand held assist was given on the  R while pt used cane on L to return inside. Session was completed using rollator to take pressure off pts R LE.         [facility] Adult PT Treatment/Exercise - 04/19/16 1117      High Level Balance   High Level Balance Comments Balance activities performed in parallel bars; Min assist with tandem stance on airx pad; EO; begining with finger tip support and progressing to no UE support; verbal and tactile cues required for even weight distribution; 3x 30 secconds. Min assist on rocker board; lateral weight shifts;  Manual facilitation at hips to assist pt in shifting weight to the RLE; 2x 30 seconds           PT Short Term Goals - 04/16/16 1610      PT SHORT TERM GOAL #1   Title Patient demonstrates proper donning & verbalizes proper care. (Target Date: 04/19/2016)   Baseline MET 04/16/2016   Time 1   Period Months   Status Achieved     PT SHORT TERM GOAL #2   Title Patient tolerates wear >10hrs total wear/day without skin issues. (Target Date: 04/19/2016)   Baseline MET 04/16/2016   Time 1   Period Months   Status Achieved     PT SHORT TERM GOAL #3   Title Patient ambulates 300' with RW & prosthesis modified independent. (Target Date: 04/19/2016)   Baseline MET 04/16/2016   Time 1   Period Months   Status Achieved     PT SHORT TERM GOAL #4   Title Pateint negotiates ramps, curbs & stairs (2 rails) with RW & prosthesis modified independent. (Target Date: 04/19/2016)   Baseline MET 04/16/2016   Time 1   Period Months   Status Achieved     PT SHORT TERM GOAL #5   Title Patient reaches 7" anteriorly & to floor without balance loss with supervision. (Target Date: 04/19/2016)   Baseline MET 04/16/2016   Time 1   Period Months   Status Achieved           PT Long Term Goals - 04/02/16 1043      PT LONG TERM GOAL #1   Title Patient verbalizes & demonstrates understanding of prosthetic care to enable safe use of prosthesis. (Target Date: 05/17/2016)   Time 2   Period Months   Status On-going     PT LONG TERM GOAL #2   Title Patient tolerates wear >90% of awake hours without skin integrity issues or limb tenderness to enable function throughout her day. (Target Date: 05/17/2016)   Time 2   Period Months   Status On-going     PT LONG TERM GOAL #3   Title Berg Balance >/= 45/56 to indicate lower fall risk. (Target Date: 05/17/2016)   Time 2   Period Months   Status On-going     PT LONG TERM GOAL #4   Title Patient ambulates 500' with LRAD & prosthesis including grass modified  indepedent to enable function in community. (Target Date: 03/17/2016)   Time 2   Period Months   Status On-going     PT LONG TERM GOAL #5   Title Patient negotiates ramps, curbs & stairs (1 rail) modified independent to enable community access. (Target Date: 05/17/2016)   Time 2   Period Months   Status On-going           Plan - 04/19/16 1126    Clinical Impression Statement Pt experianced increase in pain on the prosthetic side  after ambulating with cane. Needs additional skilled care and instruction needed to increase pts safety and independence with cane. She is making progress towards LTG and would benefit from continued PT to achieve unmet goals.   Rehab Potential Good   PT Frequency 2x / week   PT Duration Other (comment)  9 weeks (60 days)   PT Treatment/Interventions ADLs/Self Care Home Management;Gait training;Stair training;Functional mobility training;Therapeutic activities;Therapeutic exercise;Balance training;Neuromuscular re-education;Patient/family education;Prosthetic Training   PT Next Visit Plan Continue gait with cane including negotiating barriers & outdoors, balance activities   Consulted and Agree with Plan of Care Patient      Patient will benefit from skilled therapeutic intervention in order to improve the following deficits and impairments:  Abnormal gait, Decreased activity tolerance, Decreased balance, Decreased endurance, Decreased knowledge of use of DME, Decreased mobility, Decreased strength, Postural dysfunction, Prosthetic Dependency, Pain  Visit Diagnosis: Other abnormalities of gait and mobility  Unsteadiness on feet  Muscle weakness (generalized)  Other symptoms and signs involving the musculoskeletal system       G-Codes - 05/16/2016 0943    Functional Assessment Tool Used Patient is supervision with prosthesis use and care. She wears the prosthesis 7 days a week for all waking hours. Occasional breaks during mid day. Continues to need cues  on drying, sock management, and new concepts for general prosthetic management.    Functional Limitation Self care      Problem List Patient Active Problem List   Diagnosis Date Noted  . Transaminitis 12/06/2015  . Acute on chronic renal failure ([place]) 12/06/2015  . History of renal transplant 12/06/2015  . UTI (lower urinary tract infection) 12/06/2015  . Acute cholecystitis 12/06/2015  . Below knee amputation status ([place]) 11/29/2015  . Asystole ([place])   . S/P unilateral BKA (below knee amputation) ([place])   . Pericardial effusion   . Type 2 diabetes mellitus with peripheral neuropathy ([facility])   . Chronic kidney disease, stage IV (severe) ([place])   . Essential hypertension   . Anemia of chronic disease   . Acute blood loss anemia   . Gangrene associated with diabetes mellitus ([place]) 10/13/2015  . Critical lower limb ischemia 10/05/2015  . PAD (peripheral artery disease) ([place]) 09/18/2015  . Leukopenia 08/22/2014  . Protein-calorie malnutrition, severe ([place]) 12/24/2012  . Gastric ulcer 12/24/2012  . Mechanical complication of other vascular device, implant, and graft 09/22/2012  . End stage renal disease ([place]) 10/23/2011  . Physical deconditioning 10/09/2011  . HTN (hypertension), malignant 09/27/2011  . Chronic kidney disease, stage 4, severely decreased GFR ([facility]) 09/27/2011  . PNA (pneumonia) 09/27/2011  . Acute respiratory failure with hypoxia ([place]) 09/27/2011  . VOMITING 12/06/2009  . GOUT 12/21/2008  . Uncontrolled type 2 diabetes mellitus with peripheral artery disease ([place]) 08/14/2006  . HYPERLIPIDEMIA 08/14/2006  . OBESITY, NOS 08/14/2006  . FIBROADENOSIS, BREAST 08/14/2006  . Irregular menstrual cycle 08/14/2006    Benjiman Core, SPTA 05/16/2016, 4:28 PM  [facility] [address] [place], [place], [address] Phone: [phone]   Fax:  [phone]  Name: ELESHIA WOOLEY MRN: [id number] Date of Birth:  January 27, 1971

## 2016-04-22 ENCOUNTER — Encounter: Payer: Self-pay | Admitting: Physical Therapy

## 2016-04-22 NOTE — Telephone Encounter (Signed)
New paperwork in for this pt will complete and notify when ready

## 2016-04-22 NOTE — Therapy (Signed)
[facility] [address] [place], [place], [address] Phone: [phone]   Fax:  [phone]  Patient Details  Name: [name] MRN: [id number] Date of Birth: 12-24-1970 Referring Provider: Meridee Score, MD  Encounter Date: 04/22/2016  Physical Therapy Progress Note  Dates of Reporting Period: 03/18/2016 to 04/19/2016  Objective Reports of Subjective Statement: Patient reports daily wear of prosthesis with improved mobility.   Objective Measurements: Patient reports wear most of awake hours. She needs cues on adjusting ply socks for volume management & residual limb care.    Goal Update:      PT Short Term Goals - 04/16/16 1610      PT SHORT TERM GOAL #1   Title Patient demonstrates proper donning & verbalizes proper care. (Target Date: 04/19/2016)   Baseline MET 04/16/2016   Time 1   Period Months   Status Achieved     PT SHORT TERM GOAL #2   Title Patient tolerates wear >10hrs total wear/day without skin issues. (Target Date: 04/19/2016)   Baseline MET 04/16/2016   Time 1   Period Months   Status Achieved     PT SHORT TERM GOAL #3   Title Patient ambulates 300' with RW & prosthesis modified independent. (Target Date: 04/19/2016)   Baseline MET 04/16/2016   Time 1   Period Months   Status Achieved     PT SHORT TERM GOAL #4   Title Pateint negotiates ramps, curbs & stairs (2 rails) with RW & prosthesis modified independent. (Target Date: 04/19/2016)   Baseline MET 04/16/2016   Time 1   Period Months   Status Achieved     PT SHORT TERM GOAL #5   Title Patient reaches 7" anteriorly & to floor without balance loss with supervision. (Target Date: 04/19/2016)   Baseline MET 04/16/2016   Time 1   Period Months   Status Achieved          PT Long Term Goals - 04/02/16 1043      PT LONG TERM GOAL #1   Title Patient verbalizes & demonstrates understanding of prosthetic care to enable safe use of  prosthesis. (Target Date: 05/17/2016)   Time 2   Period Months   Status On-going     PT LONG TERM GOAL #2   Title Patient tolerates wear >90% of awake hours without skin integrity issues or limb tenderness to enable function throughout her day. (Target Date: 05/17/2016)   Time 2   Period Months   Status On-going     PT LONG TERM GOAL #3   Title Berg Balance >/= 45/56 to indicate lower fall risk. (Target Date: 05/17/2016)   Time 2   Period Months   Status On-going     PT LONG TERM GOAL #4   Title Patient ambulates 500' with LRAD & prosthesis including grass modified indepedent to enable function in community. (Target Date: 03/17/2016)   Time 2   Period Months   Status On-going     PT LONG TERM GOAL #5   Title Patient negotiates ramps, curbs & stairs (1 rail) modified independent to enable community access. (Target Date: 05/17/2016)   Time 2   Period Months   Status On-going       Plan: Continue established plan  Reason Skilled Services are Required: patient requires skilled care to progress community mobility with prosthesis with low fall risk.     Ayvion Kavanagh PT, DPT 04/22/2016, 7:38 AM  [facility]  [address] [place], [place], [address] Phone: [phone]   Fax:  [phone]

## 2016-04-23 ENCOUNTER — Encounter: Payer: Self-pay | Admitting: Physical Therapy

## 2016-04-23 ENCOUNTER — Ambulatory Visit: Payer: Medicare Other | Admitting: Physical Therapy

## 2016-04-23 DIAGNOSIS — R2681 Unsteadiness on feet: Secondary | ICD-10-CM | POA: Diagnosis not present

## 2016-04-23 DIAGNOSIS — R29898 Other symptoms and signs involving the musculoskeletal system: Secondary | ICD-10-CM

## 2016-04-23 DIAGNOSIS — R2689 Other abnormalities of gait and mobility: Secondary | ICD-10-CM | POA: Diagnosis not present

## 2016-04-23 DIAGNOSIS — M6281 Muscle weakness (generalized): Secondary | ICD-10-CM | POA: Diagnosis not present

## 2016-04-24 NOTE — Therapy (Signed)
[facility] [address] [place], [place], [address] Phone: [phone]   Fax:  [phone]  Physical Therapy Treatment  Patient Details  Name: [name] MRN: [id number] Date of Birth: 05-19-1971 Referring Provider: Meridee Score, MD  Encounter Date: 04/23/2016      PT End of Session - 04/23/16 1015    Visit Number 11   Number of Visits 18   Date for PT Re-Evaluation 05/17/16   Authorization Type Medicare G-Code & progress note required   PT Start Time 0932   PT Stop Time 1015   PT Time Calculation (min) 43 min   Equipment Utilized During Treatment Gait belt   Activity Tolerance Patient tolerated treatment well;Patient limited by pain   Behavior During Therapy [facility] for tasks assessed/performed      Past Medical History:  Diagnosis Date  . Anemia   . CHF (congestive heart failure) ([place])   . Critical ischemia of lower extremity hospitalized 10/05/2015   right  . Diabetic neuropathy ([place])   . ESRD (end stage renal disease) on dialysis ([place]) 09/28/2011-01/23/2013   M-W-F; [place]  . GERD (gastroesophageal reflux disease)   . High cholesterol   . History of blood transfusion    related to "kidney transplant"  . Hypertension    sees Dr. Carolin Guernsey  . Metabolic bone disease   . Peripheral vascular disease ([place])   . Pneumonia 09/27/2011  . Retinopathy   . Type II diabetes mellitus ([place])    "controlled with diet"    Past Surgical History:  Procedure Laterality Date  . AMPUTATION Right 10/13/2015   Procedure: Right Transmetatarsal Amputation;  Surgeon: Newt Minion, MD;  Location: [facility];  Service: Orthopedics;  Laterality: Right;  . AMPUTATION Right 11/29/2015   Procedure: RIGHT BELOW KNEE AMPUTATION;  Surgeon: Newt Minion, MD;  Location: [facility];  Service: Orthopedics;  Laterality: Right;  . AV FISTULA PLACEMENT  10/04/2011   Procedure: INSERTION OF ARTERIOVENOUS (AV) GORE-TEX GRAFT ARM;  Surgeon:  Angelia Mould, MD;  Location: [facility];  Service: Vascular;  Laterality: Left;  Insertion left upper arm Arteriovenous goretex graft  . AV FISTULA PLACEMENT  10/29/2011   Procedure: ARTERIOVENOUS (AV) FISTULA CREATION;  Surgeon: Angelia Mould, MD;  Location: [facility] OR;  Service: Vascular;  Laterality: Right;  Creation Right Arteriovenous Fistula   . [place] REMOVAL  10/04/2011   Procedure: REMOVAL OF ARTERIOVENOUS GORETEX GRAFT ([place]);  Surgeon: Elam Dutch, MD;  Location: [facility];  Service: Vascular;  Laterality: Left;  . CHOLECYSTECTOMY N/A 12/08/2015   Procedure: LAPAROSCOPIC CHOLECYSTECTOMY WITH INTRAOPERATIVE CHOLANGIOGRAM;  Surgeon: Stark Klein, MD;  Location: [facility];  Service: General;  Laterality: N/A;  . ESOPHAGOGASTRODUODENOSCOPY N/A 12/24/2012   Procedure: ESOPHAGOGASTRODUODENOSCOPY (EGD);  Surgeon: Milus Banister, MD;  Location: [facility];  Service: Endoscopy;  Laterality: N/A;  . FINGER AMPUTATION Right 02/26/2013   "3rd finger"  . INSERTION OF DIALYSIS CATHETER  10/04/2011   Procedure: INSERTION OF DIALYSIS CATHETER;  Surgeon: Angelia Mould, MD;  Location: [facility];  Service: Vascular;  Laterality: Right;  insertion of dialysis catheter right internal jugular  . INSERTION OF DIALYSIS CATHETER  06/23/2012   Procedure: INSERTION OF DIALYSIS CATHETER;  Surgeon: Angelia Mould, MD;  Location: [facility];  Service: Vascular;  Laterality: N/A;  Ultrasound guided  . KIDNEY TRANSPLANT  01/24/2013  . PATCH ANGIOPLASTY  06/23/2012   Procedure: PATCH ANGIOPLASTY;  Surgeon: Angelia Mould, MD;  Location: [facility]  OR;  Service: Vascular;  Laterality: Right;  . PERIPHERAL VASCULAR CATHETERIZATION N/A 09/19/2015   Procedure: Lower Extremity Angiography;  Surgeon: Adrian Prows, MD;  Location: [facility] CV LAB;  Service: Cardiovascular;  Laterality: N/A;  . PERIPHERAL VASCULAR CATHETERIZATION N/A 09/19/2015   Procedure: Abdominal Aortogram;  Surgeon: Adrian Prows, MD;  Location: [facility] CV  LAB;  Service: Cardiovascular;  Laterality: N/A;  . PERIPHERAL VASCULAR CATHETERIZATION Right 09/19/2015   Procedure: Peripheral Vascular Intervention;  Surgeon: Adrian Prows, MD;  Location: [facility] CV LAB;  Service: Cardiovascular;  Laterality: Right;  Right Common  Iliac  . PERIPHERAL VASCULAR CATHETERIZATION N/A 10/06/2015   Procedure: Lower Extremity Angiography;  Surgeon: Adrian Prows, MD;  Location: [facility] CV LAB;  Service: Cardiovascular;  Laterality: N/A;  . PERIPHERAL VASCULAR CATHETERIZATION  10/06/2015   Procedure: Peripheral Vascular Intervention;  Surgeon: Adrian Prows, MD;  Location: [facility] CV LAB;  Service: Cardiovascular;;  REIA  Omnilink 6.0x29, innova 7x80  RSFA innova 5x80  . REVISON OF ARTERIOVENOUS FISTULA  06/23/2012   Procedure: REVISON OF ARTERIOVENOUS FISTULA;  Surgeon: Angelia Mould, MD;  Location: [facility];  Service: Vascular;  Laterality: Right;  Ultrasound guided  . SHUNTOGRAM N/A 04/06/2012   Procedure: Earney Mallet;  Surgeon: Angelia Mould, MD;  Location: [facility] CATH LAB;  Service: Cardiovascular;  Laterality: N/A;    There were no vitals filed for this visit.      Subjective Assessment - 04/23/16 0934    Subjective She is wearing prosthesis most of awake hours without issues.     Pertinent History IDDM2 with neuropathy & retinopathy, kidney transplant (01/24/2013), respiratory failure 3X (09/27/2011, 10/14/2015 & 12/06/2015) hypoxia, HTN, CHF, PVD, gout (pt denies),    Limitations Lifting;Standing;Walking   Patient Stated Goals She wants to return to "normal" walking, driving, going out in community, working, going concerts, amusement parks.   Currently in Pain? No/denies                         [facility] Adult PT Treatment/Exercise - 04/23/16 1017      Transfers   Transfers Sit to Stand;Stand to Sit   Sit to Stand 5: Supervision;With upper extremity assist;From chair/3-in-1  chairs without armrests without touch on object to stabilize    Sit to Stand Details (indicate cue type and reason) cues on weight shift   Stand to Sit 5: Supervision;With upper extremity assist;To chair/3-in-1  chairs without armrests without touch on object to stabilize   Stand to Sit Details cues on weight shift     Ambulation/Gait   Ambulation/Gait Yes   Ambulation/Gait Assistance 4: Min assist;4: Min guard   Ambulation/Gait Assistance Details cane gait working on balance with upright posture, proper step length, weight shift over prosthesis in stance   Ambulation Distance (Feet) 250 Feet  250' X 2 & 100' X 2   Assistive device Prosthesis;Straight cane   Ambulation Surface Indoor;Level   Stairs Yes   Stairs Assistance 5: Supervision   Stairs Assistance Details (indicate cue type and reason) verbal & visual cues on proper step width & not circumducting in swing   Stair Management Technique Two rails;Alternating pattern;Forwards   Number of Stairs 4  5 reps   Ramp 4: Min assist  cane & prosthesis   Ramp Details (indicate cue type and reason) verbal & demo cues on posture & wt shift   Curb 4: Min assist  cane & prosthesis   Curb Details (indicate cue type  and reason) demo & verbal cues on technique including maintaining momentum, step length & balance reactions     High Level Balance   High Level Balance Activities Head turns;Side stepping;Backward walking;Negotitating around obstacles  cane & prosthesis   High Level Balance Comments tactile & verbal cues in hall for visual reference with cues on technique     Prosthetics   Prosthetic Care Comments  PT cued on sweat management & use of antiperspirant   Current prosthetic wear tolerance (days/week)  daily   Current prosthetic wear tolerance (#hours/day)  all awake hours, drying mid-day & prn   Residual limb condition  intact, no issues   Education Provided Skin check;Residual limb care;Correct ply sock adjustment   Person(s) Educated Patient   Education Method Explanation;Verbal cues    Education Method Verbalized understanding;Verbal cues required;Needs further instruction                PT Education - 04/23/16 1015    Education provided Yes   Education Details Increasing activity level with high frequency short walks b/w rooms, 4-6 medium walks (in/out of home or limited community), 1-2 long walks (her best tolerance with rollator for UE support)   Person(s) Educated Patient   Methods Explanation;Verbal cues   Comprehension Verbalized understanding;Verbal cues required;Need further instruction          PT Short Term Goals - 04/16/16 1610      PT SHORT TERM GOAL #1   Title Patient demonstrates proper donning & verbalizes proper care. (Target Date: 04/19/2016)   Baseline MET 04/16/2016   Time 1   Period Months   Status Achieved     PT SHORT TERM GOAL #2   Title Patient tolerates wear >10hrs total wear/day without skin issues. (Target Date: 04/19/2016)   Baseline MET 04/16/2016   Time 1   Period Months   Status Achieved     PT SHORT TERM GOAL #3   Title Patient ambulates 300' with RW & prosthesis modified independent. (Target Date: 04/19/2016)   Baseline MET 04/16/2016   Time 1   Period Months   Status Achieved     PT SHORT TERM GOAL #4   Title Pateint negotiates ramps, curbs & stairs (2 rails) with RW & prosthesis modified independent. (Target Date: 04/19/2016)   Baseline MET 04/16/2016   Time 1   Period Months   Status Achieved     PT SHORT TERM GOAL #5   Title Patient reaches 7" anteriorly & to floor without balance loss with supervision. (Target Date: 04/19/2016)   Baseline MET 04/16/2016   Time 1   Period Months   Status Achieved           PT Long Term Goals - 04/02/16 1043      PT LONG TERM GOAL #1   Title Patient verbalizes & demonstrates understanding of prosthetic care to enable safe use of prosthesis. (Target Date: 05/17/2016)   Time 2   Period Months   Status On-going     PT LONG TERM GOAL #2   Title Patient tolerates  wear >90% of awake hours without skin integrity issues or limb tenderness to enable function throughout her day. (Target Date: 05/17/2016)   Time 2   Period Months   Status On-going     PT LONG TERM GOAL #3   Title Berg Balance >/= 45/56 to indicate lower fall risk. (Target Date: 05/17/2016)   Time 2   Period Months   Status On-going     PT  LONG TERM GOAL #4   Title Patient ambulates 500' with LRAD & prosthesis including grass modified indepedent to enable function in community. (Target Date: 03/17/2016)   Time 2   Period Months   Status On-going     PT LONG TERM GOAL #5   Title Patient negotiates ramps, curbs & stairs (1 rail) modified independent to enable community access. (Target Date: 05/17/2016)   Time 2   Period Months   Status On-going               Plan - 04/23/16 1015    Clinical Impression Statement Patient improved gait with cane but is limited by muscle & circulation pain / fatigue in LEs with increased weight bearing. She appears to understand instruction for increasing activity level outside of PT.    Rehab Potential Good   PT Frequency 2x / week   PT Duration Other (comment)  9 weeks (60 days)   PT Treatment/Interventions ADLs/Self Care Home Management;Gait training;Stair training;Functional mobility training;Therapeutic activities;Therapeutic exercise;Balance training;Neuromuscular re-education;Patient/family education;Prosthetic Training   PT Next Visit Plan Continue gait with cane including negotiating barriers & outdoors, balance activities   Consulted and Agree with Plan of Care Patient      Patient will benefit from skilled therapeutic intervention in order to improve the following deficits and impairments:  Abnormal gait, Decreased activity tolerance, Decreased balance, Decreased endurance, Decreased knowledge of use of DME, Decreased mobility, Decreased strength, Postural dysfunction, Prosthetic Dependency, Pain  Visit Diagnosis: Other abnormalities  of gait and mobility  Unsteadiness on feet  Muscle weakness (generalized)  Other symptoms and signs involving the musculoskeletal system     Problem List Patient Active Problem List   Diagnosis Date Noted  . Transaminitis 12/06/2015  . Acute on chronic renal failure ([place]) 12/06/2015  . History of renal transplant 12/06/2015  . UTI (lower urinary tract infection) 12/06/2015  . Acute cholecystitis 12/06/2015  . Below knee amputation status ([place]) 11/29/2015  . Asystole ([place])   . S/P unilateral BKA (below knee amputation) ([place])   . Pericardial effusion   . Type 2 diabetes mellitus with peripheral neuropathy ([facility])   . Chronic kidney disease, stage IV (severe) ([place])   . Essential hypertension   . Anemia of chronic disease   . Acute blood loss anemia   . Gangrene associated with diabetes mellitus ([place]) 10/13/2015  . Critical lower limb ischemia 10/05/2015  . PAD (peripheral artery disease) ([place]) 09/18/2015  . Leukopenia 08/22/2014  . Protein-calorie malnutrition, severe ([place]) 12/24/2012  . Gastric ulcer 12/24/2012  . Mechanical complication of other vascular device, implant, and graft 09/22/2012  . End stage renal disease ([place]) 10/23/2011  . Physical deconditioning 10/09/2011  . HTN (hypertension), malignant 09/27/2011  . Chronic kidney disease, stage 4, severely decreased GFR ([facility]) 09/27/2011  . PNA (pneumonia) 09/27/2011  . Acute respiratory failure with hypoxia ([place]) 09/27/2011  . VOMITING 12/06/2009  . GOUT 12/21/2008  . Uncontrolled type 2 diabetes mellitus with peripheral artery disease ([place]) 08/14/2006  . HYPERLIPIDEMIA 08/14/2006  . OBESITY, NOS 08/14/2006  . FIBROADENOSIS, BREAST 08/14/2006  . Irregular menstrual cycle 08/14/2006    Yesica Kemler  PT, DPT 04/24/2016, 9:38 AM  [facility] [address] [place], [place], [address] Phone: [phone]   Fax:  [phone]  Name: [name] MRN:  [id number] Date of Birth: 04-Jan-1971

## 2016-04-26 ENCOUNTER — Ambulatory Visit: Payer: Medicare Other | Admitting: Physical Therapy

## 2016-04-26 ENCOUNTER — Encounter (HOSPITAL_COMMUNITY)
Admission: RE | Admit: 2016-04-26 | Discharge: 2016-04-26 | Disposition: A | Discharge status: Home / Self Care | Payer: Medicare Other | Source: Ambulatory Visit | Attending: Nephrology | Admitting: Nephrology

## 2016-04-26 ENCOUNTER — Encounter: Payer: Self-pay | Admitting: Physical Therapy

## 2016-04-26 DIAGNOSIS — N184 Chronic kidney disease, stage 4 (severe): Secondary | ICD-10-CM

## 2016-04-26 DIAGNOSIS — R2681 Unsteadiness on feet: Secondary | ICD-10-CM

## 2016-04-26 DIAGNOSIS — D509 Iron deficiency anemia, unspecified: Secondary | ICD-10-CM | POA: Insufficient documentation

## 2016-04-26 DIAGNOSIS — R29898 Other symptoms and signs involving the musculoskeletal system: Secondary | ICD-10-CM

## 2016-04-26 DIAGNOSIS — R2689 Other abnormalities of gait and mobility: Secondary | ICD-10-CM

## 2016-04-26 DIAGNOSIS — M6281 Muscle weakness (generalized): Secondary | ICD-10-CM

## 2016-04-26 LAB — IRON AND TIBC
Iron: 143 ug/dL (ref 28–170)
Saturation Ratios: 52 % — ABNORMAL HIGH (ref 10.4–31.8)
TIBC: 276 ug/dL (ref 250–450)
UIBC: 133 ug/dL

## 2016-04-26 LAB — POCT HEMOGLOBIN-HEMACUE: Hemoglobin: 12.6 g/dL (ref 12.0–15.0)

## 2016-04-26 LAB — FERRITIN: Ferritin: 1087 ng/mL — ABNORMAL HIGH (ref 11–307)

## 2016-04-26 MED ORDER — EPOETIN ALFA 10000 UNIT/ML IJ SOLN: 10000.0000 [IU] | INTRAMUSCULAR | Status: DC

## 2016-04-26 NOTE — Therapy (Signed)
[facility] [address] [place], [place], [address] Phone: [phone]   Fax:  [phone]  Physical Therapy Treatment  Patient Details  Name: [name] MRN: [id number] Date of Birth: 02/15/1971 Referring Provider: Meridee Score, MD  Encounter Date: 04/26/2016      PT End of Session - 04/26/16 1127    Visit Number 12   Number of Visits 18   Date for PT Re-Evaluation 05/17/16   Authorization Type Medicare G-Code & progress note required   PT Start Time 0930   PT Stop Time 1015   PT Time Calculation (min) 45 min   Equipment Utilized During Treatment Gait belt   Activity Tolerance Patient tolerated treatment well;Patient limited by fatigue   Behavior During Therapy [facility] for tasks assessed/performed      Past Medical History:  Diagnosis Date  . Anemia   . CHF (congestive heart failure) ([place])   . Critical ischemia of lower extremity hospitalized 10/05/2015   right  . Diabetic neuropathy ([place])   . ESRD (end stage renal disease) on dialysis ([place]) 09/28/2011-01/23/2013   M-W-F; [place]  . GERD (gastroesophageal reflux disease)   . High cholesterol   . History of blood transfusion    related to "kidney transplant"  . Hypertension    sees Dr. Carolin Guernsey  . Metabolic bone disease   . Peripheral vascular disease ([place])   . Pneumonia 09/27/2011  . Retinopathy   . Type II diabetes mellitus ([place])    "controlled with diet"    Past Surgical History:  Procedure Laterality Date  . AMPUTATION Right 10/13/2015   Procedure: Right Transmetatarsal Amputation;  Surgeon: Newt Minion, MD;  Location: [facility];  Service: Orthopedics;  Laterality: Right;  . AMPUTATION Right 11/29/2015   Procedure: RIGHT BELOW KNEE AMPUTATION;  Surgeon: Newt Minion, MD;  Location: [facility];  Service: Orthopedics;  Laterality: Right;  . AV FISTULA PLACEMENT  10/04/2011   Procedure: INSERTION OF ARTERIOVENOUS (AV) GORE-TEX GRAFT ARM;  Surgeon:  Angelia Mould, MD;  Location: [facility];  Service: Vascular;  Laterality: Left;  Insertion left upper arm Arteriovenous goretex graft  . AV FISTULA PLACEMENT  10/29/2011   Procedure: ARTERIOVENOUS (AV) FISTULA CREATION;  Surgeon: Angelia Mould, MD;  [facility];  Service: Vascular;  Laterality: Right;  Creation Right Arteriovenous Fistula   . [place] REMOVAL  10/04/2011   Procedure: REMOVAL OF ARTERIOVENOUS GORETEX GRAFT ([place]);  Surgeon: Elam Dutch, MD;  Location: [facility];  Service: Vascular;  Laterality: Left;  . CHOLECYSTECTOMY N/A 12/08/2015   Procedure: LAPAROSCOPIC CHOLECYSTECTOMY WITH INTRAOPERATIVE CHOLANGIOGRAM;  Surgeon: Stark Klein, MD;  Location: [place];  Service: General;  Laterality: N/A;  . ESOPHAGOGASTRODUODENOSCOPY N/A 12/24/2012   Procedure: ESOPHAGOGASTRODUODENOSCOPY (EGD);  Surgeon: Milus Banister, MD;  Location: [place];  Service: Endoscopy;  Laterality: N/A;  . FINGER AMPUTATION Right 02/26/2013   "3rd finger"  . INSERTION OF DIALYSIS CATHETER  10/04/2011   Procedure: INSERTION OF DIALYSIS CATHETER;  Surgeon: Angelia Mould, MD;  Location: [place];  Service: Vascular;  Laterality: Right;  insertion of dialysis catheter right internal jugular  . INSERTION OF DIALYSIS CATHETER  06/23/2012   Procedure: INSERTION OF DIALYSIS CATHETER;  Surgeon: Angelia Mould, MD;  Location: [place];  Service: Vascular;  Laterality: N/A;  Ultrasound guided  . KIDNEY TRANSPLANT  01/24/2013  . PATCH ANGIOPLASTY  06/23/2012   Procedure: PATCH ANGIOPLASTY;  Surgeon: Angelia Mould, MD;  Location[facility]  OR;  Service: Vascular;  Laterality: Right;  . PERIPHERAL VASCULAR CATHETERIZATION N/A 09/19/2015   Procedure: Lower Extremity Angiography;  Surgeon: Adrian Prows, MD;  Location: [place] CV LAB;  Service: Cardiovascular;  Laterality: N/A;  . PERIPHERAL VASCULAR CATHETERIZATION N/A 09/19/2015   Procedure: Abdominal Aortogram;  Surgeon: Adrian Prows, MD;  Location: [place] CV  LAB;  Service: Cardiovascular;  Laterality: N/A;  . PERIPHERAL VASCULAR CATHETERIZATION Right 09/19/2015   Procedure: Peripheral Vascular Intervention;  Surgeon: Adrian Prows, MD;  Location: [place] CV LAB;  Service: Cardiovascular;  Laterality: Right;  Right Common  Iliac  . PERIPHERAL VASCULAR CATHETERIZATION N/A 10/06/2015   Procedure: Lower Extremity Angiography;  Surgeon: Adrian Prows, MD;  Location: [place] [facility] LAB;  Service: Cardiovascular;  Laterality: N/A;  . PERIPHERAL VASCULAR CATHETERIZATION  10/06/2015   Procedure: Peripheral Vascular Intervention;  Surgeon: Adrian Prows, MD;  Location: [place] [facility] LAB;  Service: Cardiovascular;;  REIA  Omnilink 6.0x29, innova 7x80  RSFA innova 5x80  . REVISON OF ARTERIOVENOUS FISTULA  06/23/2012   Procedure: REVISON OF ARTERIOVENOUS FISTULA;  Surgeon: Angelia Mould, MD;  Location: [facility];  Service: Vascular;  Laterality: Right;  Ultrasound guided  . SHUNTOGRAM N/A 04/06/2012   Procedure: Earney Mallet;  Surgeon: Angelia Mould, MD;  Location: [facility] CATH LAB;  Service: Cardiovascular;  Laterality: N/A;    There were no vitals filed for this visit.      Subjective Assessment - 04/26/16 0934    Subjective Patient reports she is walking around the house with out any assistive device and taking short was walks to and from the car using her 2 wheeled walker. She is wearing the prosthesis all day and only removing to dry. She is planning on picking up a cane today.   Patient is accompained by: Family member   Pertinent History IDDM2 with neuropathy & retinopathy, kidney transplant (01/24/2013), respiratory failure 3X (09/27/2011, 10/14/2015 & 12/06/2015) hypoxia, HTN, CHF, PVD, gout (pt denies),    Limitations Lifting;Standing;Walking   Patient Stated Goals She wants to return to "normal" walking, driving, going out in community, working, going concerts, amusement parks.   Currently in Pain? (P)  No/denies           [facility] Adult PT  Treatment/Exercise - 04/26/16 1109      Transfers   Transfers Sit to Stand;Stand to Sit   Sit to Stand 5: Supervision;With upper extremity assist;From chair/3-in-1   Sit to Stand Details Verbal cues for technique   Stand to Sit 5: Supervision;With upper extremity assist;To chair/3-in-1   Stand to Sit Details (indicate cue type and reason) Verbal cues for technique     Ambulation/Gait   Ambulation/Gait Yes   Ambulation/Gait Assistance 4: Min guard;5: Supervision   Ambulation/Gait Assistance Details Twice around indoor track with blue and red mats placed on 15 foot segment of track loop to simulate walking on grass; Grass simulation included in the 230 feet of ambulation.   Ambulation Distance (Feet) 230 Feet  x1   Assistive device Prosthesis;Straight cane   Gait Pattern Step-through pattern;Decreased weight shift to right;Antalgic;Narrow base of support   Ambulation Surface Level;Unlevel;Indoor     High Level Balance   High Level Balance Activities Negotitating around obstacles;Negotiating over obstacles   High Level Balance Comments Obstacle course set up; weaving around 3 cones, step over black foam beam, step over wooden balance beam; x4 laps; VC  and demonstration for correct technique when steping over obstacle; pt with dificulty negotiating over objects; improved  with cueing and practice. Step over black foam beam leading with prosthesis; x10. Step over wooden balance beam leading with prosthesis x10; VC and demo for technique.     Prosthetics   Prosthetic Care Comments  Patient states she is wearing the prosthetic daily and only removing for sweat management.   Current prosthetic wear tolerance (days/week)  daily   Current prosthetic wear tolerance (#hours/day)  all awake hours, drying mid-day & prn   Residual limb condition  intact, no issues             Balance Exercises - 04/26/16 1120      Balance Exercises: Standing   Standing Eyes Opened Narrow base of support  (BOS);Foam/compliant surface;Head turns;Limitations   Standing Eyes Closed Narrow base of support (BOS);Foam/compliant surface;3 reps;30 secs;Limitations     Balance Exercises: Standing   Standing Eyes Opened Limitations Static standing on 2 pillows; Wide BOS; EO with head nods and turns; 3x 30 seconds each. Requiring 1 rest break during activity; min guard for pt safety;    Standing Eyes Closed Limitations Static standing on 2 foam pillows; narrow BOS; EC; 3x 30 seconds; pt requested wide BOS for remaining activities due to discomfort.           PT Short Term Goals - 04/16/16 1610      PT SHORT TERM GOAL #1   Title Patient demonstrates proper donning & verbalizes proper care. (Target Date: 04/19/2016)   Baseline MET 04/16/2016   Time 1   Period Months   Status Achieved     PT SHORT TERM GOAL #2   Title Patient tolerates wear >10hrs total wear/day without skin issues. (Target Date: 04/19/2016)   Baseline MET 04/16/2016   Time 1   Period Months   Status Achieved     PT SHORT TERM GOAL #3   Title Patient ambulates 300' with RW & prosthesis modified independent. (Target Date: 04/19/2016)   Baseline MET 04/16/2016   Time 1   Period Months   Status Achieved     PT SHORT TERM GOAL #4   Title Pateint negotiates ramps, curbs & stairs (2 rails) with RW & prosthesis modified independent. (Target Date: 04/19/2016)   Baseline MET 04/16/2016   Time 1   Period Months   Status Achieved     PT SHORT TERM GOAL #5   Title Patient reaches 7" anteriorly & to floor without balance loss with supervision. (Target Date: 04/19/2016)   Baseline MET 04/16/2016   Time 1   Period Months   Status Achieved           PT Long Term Goals - 04/02/16 1043      PT LONG TERM GOAL #1   Title Patient verbalizes & demonstrates understanding of prosthetic care to enable safe use of prosthesis. (Target Date: 05/17/2016)   Time 2   Period Months   Status On-going     PT LONG TERM GOAL #2   Title Patient  tolerates wear >90% of awake hours without skin integrity issues or limb tenderness to enable function throughout her day. (Target Date: 05/17/2016)   Time 2   Period Months   Status On-going     PT LONG TERM GOAL #3   Title Berg Balance >/= 45/56 to indicate lower fall risk. (Target Date: 05/17/2016)   Time 2   Period Months   Status On-going     PT LONG TERM GOAL #4   Title Patient ambulates 500' with LRAD & prosthesis including grass  modified indepedent to enable function in community. (Target Date: 03/17/2016)   Time 2   Period Months   Status On-going     PT LONG TERM GOAL #5   Title Patient negotiates ramps, curbs & stairs (1 rail) modified independent to enable community access. (Target Date: 05/17/2016)   Time 2   Period Months   Status On-going           Plan - 04/26/16 1128    Clinical Impression Statement Patient arrived in 2-wheeled walker however performed all of todays treatment using a straight cane. Patient fatigues quickly and her hands were shaking. When questioned she stated she does not eat breakfast and her blood sugar was probably low. Advised having something to eat before therapy and she states she can do protien shakes in the morning.   Rehab Potential Good   PT Frequency 2x / week   PT Duration Other (comment)  9 weeks (60 days)   PT Treatment/Interventions ADLs/Self Care Home Management;Gait training;Stair training;Functional mobility training;Therapeutic activities;Therapeutic exercise;Balance training;Neuromuscular re-education;Patient/family education;Prosthetic Training   PT Next Visit Plan Continue gait with cane including negotiating barriers & outdoors, balance activities   Consulted and Agree with Plan of Care Patient      Patient will benefit from skilled therapeutic intervention in order to improve the following deficits and impairments:  Abnormal gait, Decreased activity tolerance, Decreased balance, Decreased endurance, Decreased knowledge of  use of DME, Decreased mobility, Decreased strength, Postural dysfunction, Prosthetic Dependency, Pain  Visit Diagnosis: Other abnormalities of gait and mobility  Unsteadiness on feet  Muscle weakness (generalized)  Other symptoms and signs involving the musculoskeletal system     Problem List Patient Active Problem List   Diagnosis Date Noted  . Transaminitis 12/06/2015  . Acute on chronic renal failure ([place]) 12/06/2015  . History of renal transplant 12/06/2015  . UTI (lower urinary tract infection) 12/06/2015  . Acute cholecystitis 12/06/2015  . Below knee amputation status ([place]) 11/29/2015  . Asystole ([place])   . S/P unilateral BKA (below knee amputation) ([place])   . Pericardial effusion   . Type 2 diabetes mellitus with peripheral neuropathy ([facility])   . Chronic kidney disease, stage IV (severe) ([place])   . Essential hypertension   . Anemia of chronic disease   . Acute blood loss anemia   . Gangrene associated with diabetes mellitus ([place]) 10/13/2015  . Critical lower limb ischemia 10/05/2015  . PAD (peripheral artery disease) ([place]) 09/18/2015  . Leukopenia 08/22/2014  . Protein-calorie malnutrition, severe ([place]) 12/24/2012  . Gastric ulcer 12/24/2012  . Mechanical complication of other vascular device, implant, and graft 09/22/2012  . End stage renal disease ([place]) 10/23/2011  . Physical deconditioning 10/09/2011  . HTN (hypertension), malignant 09/27/2011  . Chronic kidney disease, stage 4, severely decreased GFR ([facility]) 09/27/2011  . PNA (pneumonia) 09/27/2011  . Acute respiratory failure with hypoxia ([place]) 09/27/2011  . VOMITING 12/06/2009  . GOUT 12/21/2008  . Uncontrolled type 2 diabetes mellitus with peripheral artery disease ([place]) 08/14/2006  . HYPERLIPIDEMIA 08/14/2006  . OBESITY, NOS 08/14/2006  . FIBROADENOSIS, BREAST 08/14/2006  . Irregular menstrual cycle 08/14/2006    Benjiman Core, SPTA 04/26/2016, 5:21 PM  [facility] [address] [place], [place], [address] Phone: [phone]   Fax:  [phone]  Name: [name] MRN: [id number] Date of Birth: September 01, 1970

## 2016-04-30 ENCOUNTER — Telehealth (INDEPENDENT_AMBULATORY_CARE_PROVIDER_SITE_OTHER): Payer: Self-pay | Admitting: Radiology

## 2016-04-30 ENCOUNTER — Telehealth (INDEPENDENT_AMBULATORY_CARE_PROVIDER_SITE_OTHER): Payer: Self-pay | Admitting: Orthopedic Surgery

## 2016-04-30 ENCOUNTER — Encounter: Payer: Self-pay | Admitting: Physical Therapy

## 2016-04-30 ENCOUNTER — Ambulatory Visit: Payer: Medicare Other | Admitting: Physical Therapy

## 2016-04-30 DIAGNOSIS — M6281 Muscle weakness (generalized): Secondary | ICD-10-CM

## 2016-04-30 DIAGNOSIS — R2681 Unsteadiness on feet: Secondary | ICD-10-CM

## 2016-04-30 DIAGNOSIS — R2689 Other abnormalities of gait and mobility: Secondary | ICD-10-CM | POA: Diagnosis not present

## 2016-04-30 DIAGNOSIS — R29898 Other symptoms and signs involving the musculoskeletal system: Secondary | ICD-10-CM | POA: Diagnosis not present

## 2016-04-30 NOTE — Telephone Encounter (Signed)
Pt request a call back regarding the status of her paperwork

## 2016-04-30 NOTE — Therapy (Signed)
[facility] [address] [place], [place], [address] Phone: [phone]   Fax:  [phone]  Physical Therapy Treatment  Patient Details  Name: [name] MRN: [id number] Date of Birth: 05-26-1971 Referring Provider: Meridee Score, MD  Encounter Date: 04/30/2016      PT End of Session - 04/30/16 1205    Visit Number 13   Number of Visits 18   Date for PT Re-Evaluation 05/17/16   Authorization Type Medicare G-Code & progress note required   PT Start Time 0930   PT Stop Time 1015   PT Time Calculation (min) 45 min   Equipment Utilized During Treatment Gait belt   Activity Tolerance Patient tolerated treatment well;Patient limited by fatigue   Behavior During Therapy [facility] for tasks assessed/performed      Past Medical History:  Diagnosis Date  . Anemia   . CHF (congestive heart failure) ([place])   . Critical ischemia of lower extremity hospitalized 10/05/2015   right  . Diabetic neuropathy ([place])   . ESRD (end stage renal disease) on dialysis ([place]) 09/28/2011-01/23/2013   M-W-F; [place]  . GERD (gastroesophageal reflux disease)   . High cholesterol   . History of blood transfusion    related to "kidney transplant"  . Hypertension    sees Dr. Carolin Guernsey  . Metabolic bone disease   . Peripheral vascular disease ([place])   . Pneumonia 09/27/2011  . Retinopathy   . Type II diabetes mellitus ([place])    "controlled with diet"    Past Surgical History:  Procedure Laterality Date  . AMPUTATION Right 10/13/2015   Procedure: Right Transmetatarsal Amputation;  Surgeon: Newt Minion, MD;  Location: [facility];  Service: Orthopedics;  Laterality: Right;  . AMPUTATION Right 11/29/2015   Procedure: RIGHT BELOW KNEE AMPUTATION;  Surgeon: Newt Minion, MD;  Location: [facility];  Service: Orthopedics;  Laterality: Right;  . AV FISTULA PLACEMENT  10/04/2011   Procedure: INSERTION OF ARTERIOVENOUS (AV) GORE-TEX GRAFT ARM;  Surgeon:  Angelia Mould, MD;  Location: [facility];  Service: Vascular;  Laterality: Left;  Insertion left upper arm Arteriovenous goretex graft  . AV FISTULA PLACEMENT  10/29/2011   Procedure: ARTERIOVENOUS (AV) FISTULA CREATION;  Surgeon: Angelia Mould, MD;  Location[facility];  Service: Vascular;  Laterality: Right;  Creation Right Arteriovenous Fistula   . [place] REMOVAL  10/04/2011   Procedure: REMOVAL OF ARTERIOVENOUS GORETEX GRAFT ([place]);  Surgeon: Elam Dutch, MD;  Location: [facility];  Service: Vascular;  Laterality: Left;  . CHOLECYSTECTOMY N/A 12/08/2015   Procedure: LAPAROSCOPIC CHOLECYSTECTOMY WITH INTRAOPERATIVE CHOLANGIOGRAM;  Surgeon: [place] Klein, MD;  Location: [facility];  Service: General;  Laterality: N/A;  . ESOPHAGOGASTRODUODENOSCOPY N/A 12/24/2012   Procedure: ESOPHAGOGASTRODUODENOSCOPY (EGD);  Surgeon: Milus Banister, MD;  Location: [facility];  Service: Endoscopy;  Laterality: N/A;  . FINGER AMPUTATION Right 02/26/2013   "3rd finger"  . INSERTION OF DIALYSIS CATHETER  10/04/2011   Procedure: INSERTION OF DIALYSIS CATHETER;  Surgeon: Angelia Mould, MD;  Location: [facility];  Service: Vascular;  Laterality: Right;  insertion of dialysis catheter right internal jugular  . INSERTION OF DIALYSIS CATHETER  06/23/2012   Procedure: INSERTION OF DIALYSIS CATHETER;  Surgeon: Angelia Mould, MD;  Location: [facility];  Service: Vascular;  Laterality: N/A;  Ultrasound guided  . KIDNEY TRANSPLANT  01/24/2013  . PATCH ANGIOPLASTY  06/23/2012   Procedure: PATCH ANGIOPLASTY;  Surgeon: Angelia Mould, MD;  Location: [facility]  OR;  Service: Vascular;  Laterality: Right;  . PERIPHERAL VASCULAR CATHETERIZATION N/A 09/19/2015   Procedure: Lower Extremity Angiography;  Surgeon: Adrian Prows, MD;  Location: [facility] CV LAB;  Service: Cardiovascular;  Laterality: N/A;  . PERIPHERAL VASCULAR CATHETERIZATION N/A 09/19/2015   Procedure: Abdominal Aortogram;  Surgeon: Adrian Prows, MD;  Location: [facility] CV  LAB;  Service: Cardiovascular;  Laterality: N/A;  . PERIPHERAL VASCULAR CATHETERIZATION Right 09/19/2015   Procedure: Peripheral Vascular Intervention;  Surgeon: Adrian Prows, MD;  Location: [facility] LAB;  Service: Cardiovascular;  Laterality: Right;  Right Common  Iliac  . PERIPHERAL VASCULAR CATHETERIZATION N/A 10/06/2015   Procedure: Lower Extremity Angiography;  Surgeon: Adrian Prows, MD;  Location: [place] CV LAB;  Service: Cardiovascular;  Laterality: N/A;  . PERIPHERAL VASCULAR CATHETERIZATION  10/06/2015   Procedure: Peripheral Vascular Intervention;  Surgeon: Adrian Prows, MD;  Location: [facility] CV LAB;  Service: Cardiovascular;;  REIA  Omnilink 6.0x29, innova 7x80  RSFA innova 5x80  . REVISON OF ARTERIOVENOUS FISTULA  06/23/2012   Procedure: REVISON OF ARTERIOVENOUS FISTULA;  Surgeon: Angelia Mould, MD;  Location: [facility];  Service: Vascular;  Laterality: Right;  Ultrasound guided  . SHUNTOGRAM N/A 04/06/2012   Procedure: Earney Mallet;  Surgeon: Angelia Mould, MD;  Location: [facility] CATH LAB;  Service: Cardiovascular;  Laterality: N/A;    There were no vitals filed for this visit.      Subjective Assessment - 04/30/16 0937    Subjective (P)  Patient reports she was using her cane all day saturday with no pain. She did about half a day on Monday with the cane.   Patient is accompained by: (P)  Family member   Pertinent History (P)  IDDM2 with neuropathy & retinopathy, kidney transplant (01/24/2013), respiratory failure 3X (09/27/2011, 10/14/2015 & 12/06/2015) hypoxia, HTN, CHF, PVD, gout (pt denies),    Limitations (P)  Lifting;Standing;Walking   Patient Stated Goals (P)  She wants to return to "normal" walking, driving, going out in community, working, going concerts, amusement parks.   Currently in Pain? (P)  No/denies           [facility] Adult PT Treatment/Exercise - 04/30/16 1054      Ambulation/Gait   Ambulation/Gait Yes   Ambulation/Gait Assistance 5: Supervision    Ambulation/Gait Assistance Details Twice arround indoor track, cues for increased weight shifting/stance time needed   Ambulation Distance (Feet) 230 Feet   Assistive device Prosthesis;Straight cane   Gait Pattern Step-through pattern;Antalgic;Decreased stance time - right   Ambulation Surface Level;Indoor     High Level Balance   High Level Balance Activities Negotiating over obstacles   High Level Balance Comments Step over black foam beam leading with prosthesis; x10 in the parallel bars; intermittent UE support; 10x more outside bars with cane; Min guard assist; VC for sequencing and to avoid circumduction on R.     Neuro Re-ed    Neuro Re-ed Details  Ball rolls on R then L LE with tennis ball; Demo and VC for correct technique; min guard to min assist for balance; performed in parallel bars with intermittent UE support; 2x 60 seconds each LE.     Prosthetics   Prosthetic Care Comments  Patient states she is wearing the prosthetic daily and only removing for sweat management.   Current prosthetic wear tolerance (days/week)  daily   Current prosthetic wear tolerance (#hours/day)  all awake hours, drying mid-day & prn   Residual limb condition  intact, no issues  Balance Exercises - 04/30/16 1052      Balance Exercises: Standing   Standing Eyes Opened Narrow base of support (BOS);Foam/compliant surface;Head turns;Limitations     Balance Exercises: Standing   Standing Eyes Opened Limitations Static standing on airx pad; narrow BOS; EO with head nods and turns; 3x 30 seconds each, intermittant UE support; min guard for pt safety.           PT Short Term Goals - 04/16/16 1610      PT SHORT TERM GOAL #1   Title Patient demonstrates proper donning & verbalizes proper care. (Target Date: 04/19/2016)   Baseline MET 04/16/2016   Time 1   Period Months   Status Achieved     PT SHORT TERM GOAL #2   Title Patient tolerates wear >10hrs total wear/day without skin issues.  (Target Date: 04/19/2016)   Baseline MET 04/16/2016   Time 1   Period Months   Status Achieved     PT SHORT TERM GOAL #3   Title Patient ambulates 300' with RW & prosthesis modified independent. (Target Date: 04/19/2016)   Baseline MET 04/16/2016   Time 1   Period Months   Status Achieved     PT SHORT TERM GOAL #4   Title Pateint negotiates ramps, curbs & stairs (2 rails) with RW & prosthesis modified independent. (Target Date: 04/19/2016)   Baseline MET 04/16/2016   Time 1   Period Months   Status Achieved     PT SHORT TERM GOAL #5   Title Patient reaches 7" anteriorly & to floor without balance loss with supervision. (Target Date: 04/19/2016)   Baseline MET 04/16/2016   Time 1   Period Months   Status Achieved           PT Long Term Goals - 04/02/16 1043      PT LONG TERM GOAL #1   Title Patient verbalizes & demonstrates understanding of prosthetic care to enable safe use of prosthesis. (Target Date: 05/17/2016)   Time 2   Period Months   Status On-going     PT LONG TERM GOAL #2   Title Patient tolerates wear >90% of awake hours without skin integrity issues or limb tenderness to enable function throughout her day. (Target Date: 05/17/2016)   Time 2   Period Months   Status On-going     PT LONG TERM GOAL #3   Title Berg Balance >/= 45/56 to indicate lower fall risk. (Target Date: 05/17/2016)   Time 2   Period Months   Status On-going     PT LONG TERM GOAL #4   Title Patient ambulates 500' with LRAD & prosthesis including grass modified indepedent to enable function in community. (Target Date: 03/17/2016)   Time 2   Period Months   Status On-going     PT LONG TERM GOAL #5   Title Patient negotiates ramps, curbs & stairs (1 rail) modified independent to enable community access. (Target Date: 05/17/2016)   Time 2   Period Months   Status On-going           Plan - 04/30/16 1206    Clinical Impression Statement Patient continues to show improved gait quality,  however her balance and technique for negotiating over obstacles is still in need of improvement. She required 3 rest breaks during today's session due to LE fatigue. Patient is progressing towards goals and should benefit from continued PT to achieve unmet goals   Rehab Potential Good   PT Frequency 2x /  week   PT Duration Other (comment)  9 weeks (60 days)   PT Treatment/Interventions ADLs/Self Care Home Management;Gait training;Stair training;Functional mobility training;Therapeutic activities;Therapeutic exercise;Balance training;Neuromuscular re-education;Patient/family education;Prosthetic Training   PT Next Visit Plan Continue gait with cane including negotiating barriers & outdoors, balance activities in SLS   Consulted and Agree with Plan of Care Patient      Patient will benefit from skilled therapeutic intervention in order to improve the following deficits and impairments:  Abnormal gait, Decreased activity tolerance, Decreased balance, Decreased endurance, Decreased knowledge of use of DME, Decreased mobility, Decreased strength, Postural dysfunction, Prosthetic Dependency, Pain  Visit Diagnosis: Other abnormalities of gait and mobility  Unsteadiness on feet  Muscle weakness (generalized)  Other symptoms and signs involving the musculoskeletal system     Problem List Patient Active Problem List   Diagnosis Date Noted  . Transaminitis 12/06/2015  . Acute on chronic renal failure ([place]) 12/06/2015  . History of renal transplant 12/06/2015  . UTI (lower urinary tract infection) 12/06/2015  . Acute cholecystitis 12/06/2015  . Below knee amputation status ([place]) 11/29/2015  . Asystole ([place])   . S/P unilateral BKA (below knee amputation) ([place])   . Pericardial effusion   . Type 2 diabetes mellitus with peripheral neuropathy ([facility])   . Chronic kidney disease, stage IV (severe) ([place])   . Essential hypertension   . Anemia of chronic disease   . Acute blood loss anemia   .  Gangrene associated with diabetes mellitus ([place]) 10/13/2015  . Critical lower limb ischemia 10/05/2015  . PAD (peripheral artery disease) ([place]) 09/18/2015  . Leukopenia 08/22/2014  . Protein-calorie malnutrition, severe ([place]) 12/24/2012  . Gastric ulcer 12/24/2012  . Mechanical complication of other vascular device, implant, and graft 09/22/2012  . End stage renal disease ([place]) 10/23/2011  . Physical deconditioning 10/09/2011  . HTN (hypertension), malignant 09/27/2011  . Chronic kidney disease, stage 4, severely decreased GFR ([facility]) 09/27/2011  . PNA (pneumonia) 09/27/2011  . Acute respiratory failure with hypoxia ([place]) 09/27/2011  . VOMITING 12/06/2009  . GOUT 12/21/2008  . Uncontrolled type 2 diabetes mellitus with peripheral artery disease ([place]) 08/14/2006  . HYPERLIPIDEMIA 08/14/2006  . OBESITY, NOS 08/14/2006  . FIBROADENOSIS, BREAST 08/14/2006  . Irregular menstrual cycle 08/14/2006    Benjiman Core, SPTA 04/30/2016, 1:48 PM  [facility] [address] [place], [place], [address] Phone: [phone]   Fax:  [phone]  Name: ZORAIDA HAVRILLA MRN: [id number] Date of Birth: 08-25-1970  This note has been reviewed and edited by supervising CI.  This entire session was performed under direct supervision and direction of a licensed therapist/therapist assistant . I have personally read, edited and approve of the note as written.  Willow Ora, PTA, [facility] [address], [place], [place] [address] [phone] 04/30/16, 2:57 PM

## 2016-04-30 NOTE — Telephone Encounter (Signed)
Patient called and is asking if her disability forms are done yet.  Her job is threatening to fire her unless the forms get turned in.  She will come pick them up if they are ready, please call her to advise.

## 2016-05-01 NOTE — Telephone Encounter (Signed)
I called patient and apologized for delay in her paperwork. The form left was filled out this morning and faxed today to [facility] with Claim no [id number] fax [phone]. Also informed her our medical reports department has been faxing office notes per [employer]'s request so they should have some information already about her care, printed this as well in old EMR and attached it with form completed today and left both at front desk for her to pick up.

## 2016-05-01 NOTE — Telephone Encounter (Signed)
I called patient and apologized for delay in her paperwork. The form left was filled out this morning and faxed today to [facility] with Claim no [id number] fax [phone]. Also informed her our medical reports department has been faxing office notes per [employer]'s request so they should have some information already about her care, printed this as well in old EMR and attached it with form completed today and left both at front desk for her to pick up.

## 2016-05-03 ENCOUNTER — Ambulatory Visit: Payer: Medicare Other | Admitting: Physical Therapy

## 2016-05-03 ENCOUNTER — Encounter: Payer: Self-pay | Admitting: Physical Therapy

## 2016-05-03 VITALS — BP 161/75 | HR 85

## 2016-05-03 DIAGNOSIS — R2681 Unsteadiness on feet: Secondary | ICD-10-CM | POA: Diagnosis not present

## 2016-05-03 DIAGNOSIS — H538 Other visual disturbances: Secondary | ICD-10-CM | POA: Diagnosis not present

## 2016-05-03 DIAGNOSIS — R29898 Other symptoms and signs involving the musculoskeletal system: Secondary | ICD-10-CM

## 2016-05-03 DIAGNOSIS — E119 Type 2 diabetes mellitus without complications: Secondary | ICD-10-CM | POA: Diagnosis not present

## 2016-05-03 DIAGNOSIS — E785 Hyperlipidemia, unspecified: Secondary | ICD-10-CM | POA: Diagnosis not present

## 2016-05-03 DIAGNOSIS — Z0000 Encounter for general adult medical examination without abnormal findings: Secondary | ICD-10-CM | POA: Diagnosis not present

## 2016-05-03 DIAGNOSIS — M6281 Muscle weakness (generalized): Secondary | ICD-10-CM | POA: Diagnosis not present

## 2016-05-03 DIAGNOSIS — R2689 Other abnormalities of gait and mobility: Secondary | ICD-10-CM

## 2016-05-03 DIAGNOSIS — I10 Essential (primary) hypertension: Secondary | ICD-10-CM | POA: Diagnosis not present

## 2016-05-03 DIAGNOSIS — Z01118 Encounter for examination of ears and hearing with other abnormal findings: Secondary | ICD-10-CM | POA: Diagnosis not present

## 2016-05-03 DIAGNOSIS — E559 Vitamin D deficiency, unspecified: Secondary | ICD-10-CM | POA: Diagnosis not present

## 2016-05-05 NOTE — Therapy (Signed)
[facility] [address] [place], [place], [address] Phone: [phone]   Fax:  [phone]  Physical Therapy Treatment  Patient Details  Name: [name] MRN: [id number] Date of Birth: [birth year] Referring Provider: Meridee Score, MD  Encounter Date: 05/03/2016   05/03/16 0937  PT Visits / Re-Eval  Visit Number 14  Number of Visits 18  Date for PT Re-Evaluation 05/17/16  Authorization  Authorization Type Medicare G-Code & progress note required  PT Time Calculation  PT Start Time 0932  PT Stop Time 1015  PT Time Calculation (min) 43 min  PT - End of Session  Equipment Utilized During Treatment Gait belt  Activity Tolerance Patient tolerated treatment well;Patient limited by fatigue  Behavior During Therapy [facility] for tasks assessed/performed     Past Medical History:  Diagnosis Date  . Anemia   . CHF (congestive heart failure) ([place])   . Critical ischemia of lower extremity hospitalized 10/05/2015   right  . Diabetic neuropathy ([place])   . ESRD (end stage renal disease) on dialysis ([place]) 09/28/2011-01/23/2013   M-W-F; [facility]  . GERD (gastroesophageal reflux disease)   . High cholesterol   . History of blood transfusion    related to "kidney transplant"  . Hypertension    sees Dr. Carolin Guernsey  . Metabolic bone disease   . Peripheral vascular disease ([place])   . Pneumonia 09/27/2011  . Retinopathy   . Type II diabetes mellitus ([place])    "controlled with diet"    Past Surgical History:  Procedure Laterality Date  . AMPUTATION Right 10/13/2015   Procedure: Right Transmetatarsal Amputation;  Surgeon: Newt Minion, MD;  Location: [facility];  Service: Orthopedics;  Laterality: Right;  . AMPUTATION Right 11/29/2015   Procedure: RIGHT BELOW KNEE AMPUTATION;  Surgeon: Newt Minion, MD;  Location: [facility];  Service: Orthopedics;  Laterality: Right;  . AV FISTULA PLACEMENT  10/04/2011   Procedure: INSERTION OF  ARTERIOVENOUS (AV) GORE-TEX GRAFT ARM;  Surgeon: Angelia Mould, MD;  Location: [facility];  Service: Vascular;  Laterality: Left;  Insertion left upper arm Arteriovenous goretex graft  . AV FISTULA PLACEMENT  10/29/2011   Procedure: ARTERIOVENOUS (AV) FISTULA CREATION;  Surgeon: Angelia Mould, MD;  Location: [facility] OR;  Service: Vascular;  Laterality: Right;  Creation Right Arteriovenous Fistula   . [place] REMOVAL  10/04/2011   Procedure: REMOVAL OF ARTERIOVENOUS GORETEX GRAFT ([place]);  Surgeon: Elam Dutch, MD;  Location: [facility];  Service: Vascular;  Laterality: Left;  . CHOLECYSTECTOMY N/A 12/08/2015   Procedure: LAPAROSCOPIC CHOLECYSTECTOMY WITH INTRAOPERATIVE CHOLANGIOGRAM;  Surgeon: Stark Klein, MD;  Location: [facility];  Service: General;  Laterality: N/A;  . ESOPHAGOGASTRODUODENOSCOPY N/A 12/24/2012   Procedure: ESOPHAGOGASTRODUODENOSCOPY (EGD);  Surgeon: Milus Banister, MD;  Location: [facility];  Service: Endoscopy;  Laterality: N/A;  . FINGER AMPUTATION Right 02/26/2013   "3rd finger"  . INSERTION OF DIALYSIS CATHETER  10/04/2011   Procedure: INSERTION OF DIALYSIS CATHETER;  Surgeon: Angelia Mould, MD;  Location: [facility];  Service: Vascular;  Laterality: Right;  insertion of dialysis catheter right internal jugular  . INSERTION OF DIALYSIS CATHETER  06/23/2012   Procedure: INSERTION OF DIALYSIS CATHETER;  Surgeon: Angelia Mould, MD;  Location: [facility];  Service: Vascular;  Laterality: N/A;  Ultrasound guided  . KIDNEY TRANSPLANT  01/24/2013  . PATCH ANGIOPLASTY  06/23/2012   Procedure: PATCH ANGIOPLASTY;  Surgeon: Angelia Mould, MD;  Location: [facility];  Service:  Vascular;  Laterality: Right;  . PERIPHERAL VASCULAR CATHETERIZATION N/A 09/19/2015   Procedure: Lower Extremity Angiography;  Surgeon: Adrian Prows, MD;  Location: [facility];  Service: Cardiovascular;  Laterality: N/A;  . PERIPHERAL VASCULAR CATHETERIZATION N/A 09/19/2015   Procedure: Abdominal Aortogram;   Surgeon: Adrian Prows, MD;  Location: [facility];  Service: Cardiovascular;  Laterality: N/A;  . PERIPHERAL VASCULAR CATHETERIZATION Right 09/19/2015   Procedure: Peripheral Vascular Intervention;  Surgeon: Adrian Prows, MD;  Location: [facility];  Service: Cardiovascular;  Laterality: Right;  Right Common  Iliac  . PERIPHERAL VASCULAR CATHETERIZATION N/A 10/06/2015   Procedure: Lower Extremity Angiography;  Surgeon: Adrian Prows, MD;  Location: [facility] [place] [facility];  Service: Cardiovascular;  Laterality: N/A;  . PERIPHERAL VASCULAR CATHETERIZATION  10/06/2015   Procedure: Peripheral Vascular Intervention;  Surgeon: Adrian Prows, MD;  Location: [place] [facility];  Service: Cardiovascular;;  REIA  Omnilink 6.0x29, innova 7x80  RSFA innova 5x80  . REVISON OF ARTERIOVENOUS FISTULA  06/23/2012   Procedure: REVISON OF ARTERIOVENOUS FISTULA;  Surgeon: Angelia Mould, MD;  Location: [facility];  Service: Vascular;  Laterality: Right;  Ultrasound guided  . SHUNTOGRAM N/A 04/06/2012   Procedure: Earney Mallet;  Surgeon: Angelia Mould, MD;  Location: [facility] CATH LAB;  Service: Cardiovascular;  Laterality: N/A;    Vitals:   05/03/16 0920 05/03/16 0958  BP: (!) 173/90 (!) 161/75  Pulse: 85      05/03/16 0935  Symptoms/Limitations  Subjective To clinic today with straight cane, drove her self. Has been driving all over without issues using left foot, sometimes the prosthesis.   Pertinent History IDDM2 with neuropathy & retinopathy, kidney transplant (01/24/2013), respiratory failure 3X (09/27/2011, 10/14/2015 & 12/06/2015) hypoxia, HTN, CHF, PVD, gout (pt denies),   Limitations Lifting;Standing;Walking  Patient Stated Goals She wants to return to "normal" walking, driving, going out in community, working, going concerts, amusement parks.  Pain Assessment  Currently in Pain? No/denies       05/03/16 5916  Transfers  Transfers Sit to Stand;Stand to Sit  Sit to Stand 5: Supervision;With upper extremity  assist;From chair/3-in-1  Stand to Sit 5: Supervision;With upper extremity assist;To chair/3-in-1  Ambulation/Gait  Ambulation/Gait Yes  Ambulation/Gait Assistance 5: Supervision  Ambulation/Gait Assistance Details cues on weight shifting and stance time on prosthesis, dizziness reported toward end of 1st gait trial. 173/90 BP after sitting for ~5 mintues.    Ambulation Distance (Feet) 250 Feet (x 2)  Assistive device Prosthesis;Straight cane  Gait Pattern Step-through pattern;Antalgic;Decreased stance time - right  Ambulation Surface Level;Indoor  Ramp 4: Min assist;Other (comment) (to min guard assist)  Ramp Details (indicate cue type and reason) x3 reps  Curb 4: Min assist;Other (comment) (to min guard assist)  Curb Details (indicate cue type and reason) x 3 reps  High Level Balance  High Level Balance Activities Negotitating around obstacles;Negotiating over obstacles  High Level Balance Comments obstacle course consisting of walking around hoola hoops<>stepping over 4 bolsters of vaired heights with straight cane x 6 laps with min guard assist to min assist for balance  Prosthetics  Current prosthetic wear tolerance (days/week)  daily  Current prosthetic wear tolerance (#hours/day)  all awake hours, drying mid-day & prn  Residual limb condition  intact, no issues  Donning Prosthesis 6  Doffing Prosthesis 6            PT Short Term Goals - 04/16/16 1610      PT SHORT TERM GOAL #1  Title Patient demonstrates proper donning & verbalizes proper care. (Target Date: 04/19/2016)   Baseline MET 04/16/2016   Time 1   Period Months   Status Achieved     PT SHORT TERM GOAL #2   Title Patient tolerates wear >10hrs total wear/day without skin issues. (Target Date: 04/19/2016)   Baseline MET 04/16/2016   Time 1   Period Months   Status Achieved     PT SHORT TERM GOAL #3   Title Patient ambulates 300' with RW & prosthesis modified independent. (Target Date: 04/19/2016)    Baseline MET 04/16/2016   Time 1   Period Months   Status Achieved     PT SHORT TERM GOAL #4   Title Pateint negotiates ramps, curbs & stairs (2 rails) with RW & prosthesis modified independent. (Target Date: 04/19/2016)   Baseline MET 04/16/2016   Time 1   Period Months   Status Achieved     PT SHORT TERM GOAL #5   Title Patient reaches 7" anteriorly & to floor without balance loss with supervision. (Target Date: 04/19/2016)   Baseline MET 04/16/2016   Time 1   Period Months   Status Achieved           PT Long Term Goals - 04/02/16 1043      PT LONG TERM GOAL #1   Title Patient verbalizes & demonstrates understanding of prosthetic care to enable safe use of prosthesis. (Target Date: 05/17/2016)   Time 2   Period Months   Status On-going     PT LONG TERM GOAL #2   Title Patient tolerates wear >90% of awake hours without skin integrity issues or limb tenderness to enable function throughout her day. (Target Date: 05/17/2016)   Time 2   Period Months   Status On-going     PT LONG TERM GOAL #3   Title Berg Balance >/= 45/56 to indicate lower fall risk. (Target Date: 05/17/2016)   Time 2   Period Months   Status On-going     PT LONG TERM GOAL #4   Title Patient ambulates 500' with LRAD & prosthesis including grass modified indepedent to enable function in community. (Target Date: 03/17/2016)   Time 2   Period Months   Status On-going     PT LONG TERM GOAL #5   Title Patient negotiates ramps, curbs & stairs (1 rail) modified independent to enable community access. (Target Date: 05/17/2016)   Time 2   Period Months   Status On-going        05/03/16 0937  Plan  Clinical Impression Statement Today's skilled session continued to address gait and barriers with cane/prosthesis. Pt with 1 episode of dizziness that resolved with seated rest break, no additional dizziness reported. Pt is making steady progress toward goals and should benefit fom continued PT to progress toward  unmet goals.  Pt will benefit from skilled therapeutic intervention in order to improve on the following deficits Abnormal gait;Decreased activity tolerance;Decreased balance;Decreased endurance;Decreased knowledge of use of DME;Decreased mobility;Decreased strength;Postural dysfunction;Prosthetic Dependency;Pain  Rehab Potential Good  PT Frequency 2x / week  PT Duration Other (comment) (9 weeks (60 days))  PT Treatment/Interventions ADLs/Self Care Home Management;Gait training;Stair training;Functional mobility training;Therapeutic activities;Therapeutic exercise;Balance training;Neuromuscular re-education;Patient/family education;Prosthetic Training  PT Next Visit Plan Continue gait with cane including negotiating barriers & outdoors, balance activities in SLS  Consulted and Agree with Plan of Care Patient       Patient will benefit from skilled therapeutic intervention in order to improve  the following deficits and impairments:  Abnormal gait, Decreased activity tolerance, Decreased balance, Decreased endurance, Decreased knowledge of use of DME, Decreased mobility, Decreased strength, Postural dysfunction, Prosthetic Dependency, Pain  Visit Diagnosis: Other abnormalities of gait and mobility  Unsteadiness on feet  Muscle weakness (generalized)  Other symptoms and signs involving the musculoskeletal system     Problem List Patient Active Problem List   Diagnosis Date Noted  . Transaminitis 12/06/2015  . Acute on chronic renal failure ([place]) 12/06/2015  . History of renal transplant 12/06/2015  . UTI (lower urinary tract infection) 12/06/2015  . Acute cholecystitis 12/06/2015  . Below knee amputation status ([place]) 11/29/2015  . Asystole ([place])   . S/P unilateral BKA (below knee amputation) ([place])   . Pericardial effusion   . Type 2 diabetes mellitus with peripheral neuropathy ([facility])   . Chronic kidney disease, stage IV (severe) ([place])   . Essential hypertension   . Anemia of  chronic disease   . Acute blood loss anemia   . Gangrene associated with diabetes mellitus ([place]) 10/13/2015  . Critical lower limb ischemia 10/05/2015  . PAD (peripheral artery disease) ([place]) 09/18/2015  . Leukopenia 08/22/2014  . Protein-calorie malnutrition, severe ([place]) 12/24/2012  . Gastric ulcer 12/24/2012  . Mechanical complication of other vascular device, implant, and graft 09/22/2012  . End stage renal disease ([place]) 10/23/2011  . Physical deconditioning 10/09/2011  . HTN (hypertension), malignant 09/27/2011  . Chronic kidney disease, stage 4, severely decreased GFR ([facility]) 09/27/2011  . PNA (pneumonia) 09/27/2011  . Acute respiratory failure with hypoxia ([place]) 09/27/2011  . VOMITING 12/06/2009  . GOUT 12/21/2008  . Uncontrolled type 2 diabetes mellitus with peripheral artery disease ([place]) 08/14/2006  . HYPERLIPIDEMIA 08/14/2006  . OBESITY, NOS 08/14/2006  . FIBROADENOSIS, BREAST 08/14/2006  . Irregular menstrual cycle 08/14/2006    Willow Ora, PTA, [facility] [address], [place], [place] [address] [phone] 05/05/16, 11:51 PM   Name: [name] MRN: [id number] Date of Birth: 10/10/1970

## 2016-05-06 ENCOUNTER — Ambulatory Visit: Payer: Medicare Other | Admitting: Physical Therapy

## 2016-05-06 ENCOUNTER — Encounter: Payer: Self-pay | Admitting: Physical Therapy

## 2016-05-06 DIAGNOSIS — D631 Anemia in chronic kidney disease: Secondary | ICD-10-CM | POA: Diagnosis not present

## 2016-05-06 DIAGNOSIS — R2681 Unsteadiness on feet: Secondary | ICD-10-CM

## 2016-05-06 DIAGNOSIS — R2689 Other abnormalities of gait and mobility: Secondary | ICD-10-CM

## 2016-05-06 DIAGNOSIS — M6281 Muscle weakness (generalized): Secondary | ICD-10-CM

## 2016-05-06 DIAGNOSIS — I129 Hypertensive chronic kidney disease with stage 1 through stage 4 chronic kidney disease, or unspecified chronic kidney disease: Secondary | ICD-10-CM | POA: Diagnosis not present

## 2016-05-06 DIAGNOSIS — R29898 Other symptoms and signs involving the musculoskeletal system: Secondary | ICD-10-CM | POA: Diagnosis not present

## 2016-05-06 DIAGNOSIS — N183 Chronic kidney disease, stage 3 (moderate): Secondary | ICD-10-CM | POA: Diagnosis not present

## 2016-05-06 DIAGNOSIS — N2581 Secondary hyperparathyroidism of renal origin: Secondary | ICD-10-CM | POA: Diagnosis not present

## 2016-05-06 DIAGNOSIS — Z940 Kidney transplant status: Secondary | ICD-10-CM | POA: Diagnosis not present

## 2016-05-06 NOTE — Therapy (Signed)
[facility] [address] [place], [place], [address] Phone: [phone]   Fax:  [phone]  Physical Therapy Treatment  Patient Details  Name: [name] MRN: [id number] Date of Birth: 07-Nov-1970 Referring Provider: Meridee Score, MD  Encounter Date: 05/06/2016      PT End of Session - 05/06/16 0939    Visit Number 15   Number of Visits 18   Date for PT Re-Evaluation 05/17/16   Authorization Type Medicare G-Code & progress note required   PT Start Time 0933   PT Stop Time 1015   PT Time Calculation (min) 42 min   Equipment Utilized During Treatment Gait belt   Activity Tolerance Patient tolerated treatment well;Patient limited by fatigue   Behavior During Therapy [facility] for tasks assessed/performed      Past Medical History:  Diagnosis Date  . Anemia   . CHF (congestive heart failure) ([place])   . Critical ischemia of lower extremity hospitalized 10/05/2015   right  . Diabetic neuropathy ([place])   . ESRD (end stage renal disease) on dialysis ([place]) 09/28/2011-01/23/2013   M-W-F; [place]  . GERD (gastroesophageal reflux disease)   . High cholesterol   . History of blood transfusion    related to "kidney transplant"  . Hypertension    sees Dr. Carolin Guernsey  . Metabolic bone disease   . Peripheral vascular disease ([place])   . Pneumonia 09/27/2011  . Retinopathy   . Type II diabetes mellitus ([place])    "controlled with diet"    Past Surgical History:  Procedure Laterality Date  . AMPUTATION Right 10/13/2015   Procedure: Right Transmetatarsal Amputation;  Surgeon: Newt Minion, MD;  Location: [facility];  Service: Orthopedics;  Laterality: Right;  . AMPUTATION Right 11/29/2015   Procedure: RIGHT BELOW KNEE AMPUTATION;  Surgeon: Newt Minion, MD;  Location: [facility];  Service: Orthopedics;  Laterality: Right;  . AV FISTULA PLACEMENT  10/04/2011   Procedure: INSERTION OF ARTERIOVENOUS (AV) GORE-TEX GRAFT ARM;  Surgeon:  Angelia Mould, MD;  Location: [facility];  Service: Vascular;  Laterality: Left;  Insertion left upper arm Arteriovenous goretex graft  . AV FISTULA PLACEMENT  10/29/2011   Procedure: ARTERIOVENOUS (AV) FISTULA CREATION;  Surgeon: Angelia Mould, MD;  [facility];  Service: Vascular;  Laterality: Right;  Creation Right Arteriovenous Fistula   . [place] REMOVAL  10/04/2011   Procedure: REMOVAL OF ARTERIOVENOUS GORETEX GRAFT ([place]);  Surgeon: Elam Dutch, MD;  Location: [facility];  Service: Vascular;  Laterality: Left;  . CHOLECYSTECTOMY N/A 12/08/2015   Procedure: LAPAROSCOPIC CHOLECYSTECTOMY WITH INTRAOPERATIVE CHOLANGIOGRAM;  Surgeon: Stark Klein, MD;  Location: [facility];  Service: General;  Laterality: N/A;  . ESOPHAGOGASTRODUODENOSCOPY N/A 12/24/2012   Procedure: ESOPHAGOGASTRODUODENOSCOPY (EGD);  Surgeon: Milus Banister, MD;  Location: [facility];  Service: Endoscopy;  Laterality: N/A;  . FINGER AMPUTATION Right 02/26/2013   "3rd finger"  . INSERTION OF DIALYSIS CATHETER  10/04/2011   Procedure: INSERTION OF DIALYSIS CATHETER;  Surgeon: Angelia Mould, MD;  Location: [facility];  Service: Vascular;  Laterality: Right;  insertion of dialysis catheter right internal jugular  . INSERTION OF DIALYSIS CATHETER  06/23/2012   Procedure: INSERTION OF DIALYSIS CATHETER;  Surgeon: Angelia Mould, MD;  Location: [facility];  Service: Vascular;  Laterality: N/A;  Ultrasound guided  . KIDNEY TRANSPLANT  01/24/2013  . PATCH ANGIOPLASTY  06/23/2012   Procedure: PATCH ANGIOPLASTY;  Surgeon: Angelia Mould, MD;  [facility]  OR;  Service: Vascular;  Laterality: Right;  . PERIPHERAL VASCULAR CATHETERIZATION N/A 09/19/2015   Procedure: Lower Extremity Angiography;  Surgeon: Adrian Prows, MD;  Location: [facility];  Service: Cardiovascular;  Laterality: N/A;  . PERIPHERAL VASCULAR CATHETERIZATION N/A 09/19/2015   Procedure: Abdominal Aortogram;  Surgeon: Adrian Prows, MD;  Location: [facility] CV  LAB;  Service: Cardiovascular;  Laterality: N/A;  . PERIPHERAL VASCULAR CATHETERIZATION Right 09/19/2015   Procedure: Peripheral Vascular Intervention;  Surgeon: Adrian Prows, MD;  Location: [facility] CV LAB;  Service: Cardiovascular;  Laterality: Right;  Right Common  Iliac  . PERIPHERAL VASCULAR CATHETERIZATION N/A 10/06/2015   Procedure: Lower Extremity Angiography;  Surgeon: Adrian Prows, MD;  Location: [facility] LAB;  Service: Cardiovascular;  Laterality: N/A;  . PERIPHERAL VASCULAR CATHETERIZATION  10/06/2015   Procedure: Peripheral Vascular Intervention;  Surgeon: Adrian Prows, MD;  Location: [facility] CV LAB;  Service: Cardiovascular;;  REIA  Omnilink 6.0x29, innova 7x80  RSFA innova 5x80  . REVISON OF ARTERIOVENOUS FISTULA  06/23/2012   Procedure: REVISON OF ARTERIOVENOUS FISTULA;  Surgeon: Angelia Mould, MD;  Location: [facility];  Service: Vascular;  Laterality: Right;  Ultrasound guided  . SHUNTOGRAM N/A 04/06/2012   Procedure: [place] Mallet;  Surgeon: Angelia Mould, MD;  Location: [facility] CATH LAB;  Service: Cardiovascular;  Laterality: N/A;    There were no vitals filed for this visit.      Subjective Assessment - 05/06/16 0938    Subjective No new complaints. No falls or pain to report. Reports she has been walking more without the cane "I keep forgetting it", "it slows me down".   Patient is accompained by: Family member   Pertinent History IDDM2 with neuropathy & retinopathy, kidney transplant (01/24/2013), respiratory failure 3X (09/27/2011, 10/14/2015 & 12/06/2015) hypoxia, HTN, CHF, PVD, gout (pt denies),    Limitations Lifting;Standing;Walking   Patient Stated Goals She wants to return to "normal" walking, driving, going out in community, working, going concerts, amusement parks.   Currently in Pain? No/denies           [facility] Adult PT Treatment/Exercise - 05/06/16 0940      Transfers   Transfers Sit to Stand;Stand to Sit   Sit to Stand 5: Supervision;With upper  extremity assist;From chair/3-in-1;From bed   Stand to Sit 5: Supervision;With upper extremity assist;To chair/3-in-1;To bed     Ambulation/Gait   Ambulation/Gait Yes   Ambulation/Gait Assistance 4: Min guard;5: Supervision   Ambulation/Gait Assistance Details cues for equal step length and for weight shifting onto prosthesis in stance phase. improvement in antaglic gait pattern after addition of sock ply.   Ambulation Distance (Feet) 295 Feet  x1, 280 x1, 150 x1   Assistive device Prosthesis;None   Gait Pattern Step-through pattern;Antalgic;Decreased stance time - right   Ambulation Surface Level;Indoor   Gait Comments gait along ~50 foot hallway: forward gait with head turns left<>right x 4 laps with min guard to min assist. Pt noted to be short on prosthetic side, sock ply adjusted at this time. Improved gait pattern noted after addition of single ply sock (gait distance up above); forward gait with head movements up<>down x 4 laps with min assist for balance.     Prosthetics   Current prosthetic wear tolerance (days/week)  daily   Current prosthetic wear tolerance (#hours/day)  all awake hours, drying mid-day & prn   Residual limb condition  intact, no issues   Donning Prosthesis Modified independent (device/increased time)   Doffing Prosthesis Modified  independent (device/increased time)             PT Short Term Goals - 04/16/16 1610      PT SHORT TERM GOAL #1   Title Patient demonstrates proper donning & verbalizes proper care. (Target Date: 04/19/2016)   Baseline MET 04/16/2016   Time 1   Period Months   Status Achieved     PT SHORT TERM GOAL #2   Title Patient tolerates wear >10hrs total wear/day without skin issues. (Target Date: 04/19/2016)   Baseline MET 04/16/2016   Time 1   Period Months   Status Achieved     PT SHORT TERM GOAL #3   Title Patient ambulates 300' with RW & prosthesis modified independent. (Target Date: 04/19/2016)   Baseline MET 04/16/2016    Time 1   Period Months   Status Achieved     PT SHORT TERM GOAL #4   Title Pateint negotiates ramps, curbs & stairs (2 rails) with RW & prosthesis modified independent. (Target Date: 04/19/2016)   Baseline MET 04/16/2016   Time 1   Period Months   Status Achieved     PT SHORT TERM GOAL #5   Title Patient reaches 7" anteriorly & to floor without balance loss with supervision. (Target Date: 04/19/2016)   Baseline MET 04/16/2016   Time 1   Period Months   Status Achieved           PT Long Term Goals - 04/02/16 1043      PT LONG TERM GOAL #1   Title Patient verbalizes & demonstrates understanding of prosthetic care to enable safe use of prosthesis. (Target Date: 05/17/2016)   Time 2   Period Months   Status On-going     PT LONG TERM GOAL #2   Title Patient tolerates wear >90% of awake hours without skin integrity issues or limb tenderness to enable function throughout her day. (Target Date: 05/17/2016)   Time 2   Period Months   Status On-going     PT LONG TERM GOAL #3   Title Berg Balance >/= 45/56 to indicate lower fall risk. (Target Date: 05/17/2016)   Time 2   Period Months   Status On-going     PT LONG TERM GOAL #4   Title Patient ambulates 500' with LRAD & prosthesis including grass modified indepedent to enable function in community. (Target Date: 03/17/2016)   Time 2   Period Months   Status On-going     PT LONG TERM GOAL #5   Title Patient negotiates ramps, curbs & stairs (1 rail) modified independent to enable community access. (Target Date: 05/17/2016)   Time 2   Period Months   Status On-going            Plan - 05/06/16 0939    Clinical Impression Statement Today's skilled session addressed gait without AD on level indoor surfaces. Pt does fatigue quickly when not using AD. Pt advised at this time to only go in home and short distances in community without AD due to fatigue and soreness that occur with longer distances. She is to use her cane if she will  be walking a long distance or a lot over course of day. Pt verbalized understanding.    Rehab Potential Good   PT Frequency 2x / week   PT Duration Other (comment)  9 weeks (60 days)   PT Treatment/Interventions ADLs/Self Care Home Management;Gait training;Stair training;Functional mobility training;Therapeutic activities;Therapeutic exercise;Balance training;Neuromuscular re-education;Patient/family education;Prosthetic Training   PT Next  Visit Plan Continue gait without cane including negotiating barriers & and use of cane with outdoors, balance activities in SLS   Consulted and Agree with Plan of Care Patient      Patient will benefit from skilled therapeutic intervention in order to improve the following deficits and impairments:  Abnormal gait, Decreased activity tolerance, Decreased balance, Decreased endurance, Decreased knowledge of use of DME, Decreased mobility, Decreased strength, Postural dysfunction, Prosthetic Dependency, Pain  Visit Diagnosis: Other abnormalities of gait and mobility  Unsteadiness on feet  Muscle weakness (generalized)  Other symptoms and signs involving the musculoskeletal system     Problem List Patient Active Problem List   Diagnosis Date Noted  . Transaminitis 12/06/2015  . Acute on chronic renal failure ([place]) 12/06/2015  . History of renal transplant 12/06/2015  . UTI (lower urinary tract infection) 12/06/2015  . Acute cholecystitis 12/06/2015  . Below knee amputation status ([place]) 11/29/2015  . Asystole ([place])   . S/P unilateral BKA (below knee amputation) ([employer])   . Pericardial effusion   . Type 2 diabetes mellitus with peripheral neuropathy ([facility])   . Chronic kidney disease, stage IV (severe) ([place])   . Essential hypertension   . Anemia of chronic disease   . Acute blood loss anemia   . Gangrene associated with diabetes mellitus ([place]) 10/13/2015  . Critical lower limb ischemia 10/05/2015  . PAD (peripheral artery disease) ([place]) 09/18/2015   . Leukopenia 08/22/2014  . Protein-calorie malnutrition, severe ([place]) 12/24/2012  . Gastric ulcer 12/24/2012  . Mechanical complication of other vascular device, implant, and graft 09/22/2012  . End stage renal disease ([place]) 10/23/2011  . Physical deconditioning 10/09/2011  . HTN (hypertension), malignant 09/27/2011  . Chronic kidney disease, stage 4, severely decreased GFR ([facility]) 09/27/2011  . PNA (pneumonia) 09/27/2011  . Acute respiratory failure with hypoxia ([place]) 09/27/2011  . VOMITING 12/06/2009  . GOUT 12/21/2008  . Uncontrolled type 2 diabetes mellitus with peripheral artery disease ([place]) 08/14/2006  . HYPERLIPIDEMIA 08/14/2006  . OBESITY, NOS 08/14/2006  . FIBROADENOSIS, BREAST 08/14/2006  . Irregular menstrual cycle 08/14/2006    Willow Ora, PTA, [facility] [address] [place], [place] [address] [phone] 05/06/16, 10:40 AM  Name: [name] MRN: [id number] Date of Birth: [birth year]

## 2016-05-07 DIAGNOSIS — Z940 Kidney transplant status: Secondary | ICD-10-CM | POA: Diagnosis not present

## 2016-05-08 ENCOUNTER — Ambulatory Visit: Payer: Medicare Other | Admitting: Physical Therapy

## 2016-05-08 ENCOUNTER — Encounter: Payer: Self-pay | Admitting: Physical Therapy

## 2016-05-08 DIAGNOSIS — M6281 Muscle weakness (generalized): Secondary | ICD-10-CM

## 2016-05-08 DIAGNOSIS — R2689 Other abnormalities of gait and mobility: Secondary | ICD-10-CM | POA: Diagnosis not present

## 2016-05-08 DIAGNOSIS — R2681 Unsteadiness on feet: Secondary | ICD-10-CM | POA: Diagnosis not present

## 2016-05-08 DIAGNOSIS — R29898 Other symptoms and signs involving the musculoskeletal system: Secondary | ICD-10-CM

## 2016-05-08 NOTE — Therapy (Signed)
[facility] [address] [place], [place], [address] Phone: [phone]   Fax:  [phone]  Physical Therapy Treatment  Patient Details  Name: [name] MRN: [id number] Date of Birth: [birth year] Referring Provider: Meridee Score, MD  Encounter Date: 05/08/2016      PT End of Session - 05/08/16 1147    Visit Number 16   Number of Visits 18   Date for PT Re-Evaluation 05/17/16   Authorization Type Medicare G-Code & progress note required   PT Start Time 0930   PT Stop Time 1015   PT Time Calculation (min) 45 min   Equipment Utilized During Treatment Gait belt   Activity Tolerance Patient tolerated treatment well;Patient limited by fatigue;Patient limited by pain   Behavior During Therapy [facility] for tasks assessed/performed      Past Medical History:  Diagnosis Date  . Anemia   . CHF (congestive heart failure) ([place])   . Critical ischemia of lower extremity hospitalized 10/05/2015   right  . Diabetic neuropathy ([place])   . ESRD (end stage renal disease) on dialysis ([place]) 09/28/2011-01/23/2013   M-W-F; [place]  . GERD (gastroesophageal reflux disease)   . High cholesterol   . History of blood transfusion    related to "kidney transplant"  . Hypertension    sees Dr. Carolin Guernsey  . Metabolic bone disease   . Peripheral vascular disease ([place])   . Pneumonia 09/27/2011  . Retinopathy   . Type II diabetes mellitus ([place])    "controlled with diet"    Past Surgical History:  Procedure Laterality Date  . AMPUTATION Right 10/13/2015   Procedure: Right Transmetatarsal Amputation;  Surgeon: Newt Minion, MD;  Location: [facility];  Service: Orthopedics;  Laterality: Right;  . AMPUTATION Right 11/29/2015   Procedure: RIGHT BELOW KNEE AMPUTATION;  Surgeon: Newt Minion, MD;  Location: [facility];  Service: Orthopedics;  Laterality: Right;  . AV FISTULA PLACEMENT  10/04/2011   Procedure: INSERTION OF ARTERIOVENOUS (AV)  GORE-TEX GRAFT ARM;  Surgeon: Angelia Mould, MD;  Location: [facility];  Service: Vascular;  Laterality: Left;  Insertion left upper arm Arteriovenous goretex graft  . AV FISTULA PLACEMENT  10/29/2011   Procedure: ARTERIOVENOUS (AV) FISTULA CREATION;  Surgeon: Angelia Mould, MD;  Location: [facility] OR;  Service: Vascular;  Laterality: Right;  Creation Right Arteriovenous Fistula   . [place] REMOVAL  10/04/2011   Procedure: REMOVAL OF ARTERIOVENOUS GORETEX GRAFT ([employer]);  Surgeon: Elam Dutch, MD;  Location: [facility];  Service: Vascular;  Laterality: Left;  . CHOLECYSTECTOMY N/A 12/08/2015   Procedure: LAPAROSCOPIC CHOLECYSTECTOMY WITH INTRAOPERATIVE CHOLANGIOGRAM;  Surgeon: Stark [facility], MD;  Location: [facility];  Service: General;  Laterality: N/A;  . ESOPHAGOGASTRODUODENOSCOPY N/A 12/24/2012   Procedure: ESOPHAGOGASTRODUODENOSCOPY (EGD);  Surgeon: Milus Banister, MD;  Location: [facility];  Service: Endoscopy;  Laterality: N/A;  . FINGER AMPUTATION Right 02/26/2013   "3rd finger"  . INSERTION OF DIALYSIS CATHETER  10/04/2011   Procedure: INSERTION OF DIALYSIS CATHETER;  Surgeon: Angelia Mould, MD;  Location: [facility];  Service: Vascular;  Laterality: Right;  insertion of dialysis catheter right internal jugular  . INSERTION OF DIALYSIS CATHETER  06/23/2012   Procedure: INSERTION OF DIALYSIS CATHETER;  Surgeon: Angelia Mould, MD;  Location: [facility];  Service: Vascular;  Laterality: N/A;  Ultrasound guided  . KIDNEY TRANSPLANT  01/24/2013  . PATCH ANGIOPLASTY  06/23/2012   Procedure: PATCH ANGIOPLASTY;  Surgeon: Angelia Mould, MD;  [facility]: [facility];  Service: Vascular;  Laterality: Right;  . PERIPHERAL VASCULAR CATHETERIZATION N/A 09/19/2015   Procedure: Lower Extremity Angiography;  Surgeon: Adrian Prows, MD;  Location: [facility];  Service: Cardiovascular;  Laterality: N/A;  . PERIPHERAL VASCULAR CATHETERIZATION N/A 09/19/2015   Procedure: Abdominal Aortogram;  Surgeon: Adrian Prows,  MD;  Location: [facility];  Service: Cardiovascular;  Laterality: N/A;  . PERIPHERAL VASCULAR CATHETERIZATION Right 09/19/2015   Procedure: Peripheral Vascular Intervention;  Surgeon: Adrian Prows, MD;  Location: [facility];  Service: Cardiovascular;  Laterality: Right;  Right Common  Iliac  . PERIPHERAL VASCULAR CATHETERIZATION N/A 10/06/2015   Procedure: Lower Extremity Angiography;  Surgeon: Adrian Prows, MD;  Location: [facility];  Service: Cardiovascular;  Laterality: N/A;  . PERIPHERAL VASCULAR CATHETERIZATION  10/06/2015   Procedure: Peripheral Vascular Intervention;  Surgeon: Adrian Prows, MD;  Location: [facility];  Service: Cardiovascular;;  REIA  Omnilink 6.0x29, innova 7x80  RSFA innova 5x80  . REVISON OF ARTERIOVENOUS FISTULA  06/23/2012   Procedure: REVISON OF ARTERIOVENOUS FISTULA;  Surgeon: Angelia Mould, MD;  Location: [facility];  Service: Vascular;  Laterality: Right;  Ultrasound guided  . SHUNTOGRAM N/A 04/06/2012   Procedure: Earney Mallet;  Surgeon: Angelia Mould, MD;  Location: [facility] CATH LAB;  Service: Cardiovascular;  Laterality: N/A;    There were no vitals filed for this visit.      Subjective Assessment - 05/08/16 0933    Subjective No new complaints. No falls or pain to report.    Pertinent History IDDM2 with neuropathy & retinopathy, kidney transplant (01/24/2013), respiratory failure 3X (09/27/2011, 10/14/2015 & 12/06/2015) hypoxia, HTN, CHF, PVD, gout (pt denies),    Limitations Lifting;Standing;Walking   Patient Stated Goals She wants to return to "normal" walking, driving, going out in community, working, going concerts, amusement parks.   Currently in Pain? No/denies          [facility] PT Assessment - 05/08/16 0937      6 Minute Walk- Baseline   6 Minute Walk- Baseline yes   BP (mmHg) 167/81   HR (bpm) 79   02 Sat (%RA) 99 %   Modified Borg Scale for Dyspnea 0- Nothing at all   Perceived Rate of Exertion (Borg) 6-     6 Minute  walk- Post Test   6 Minute Walk Post Test yes   BP (mmHg) (!)  140/97   HR (bpm) 82   02 Sat (%RA) 99 %   Modified Borg Scale for Dyspnea 0- Nothing at all   Perceived Rate of Exertion (Borg) 13- Somewhat hard     6 minute walk test results    Aerobic Endurance Distance Walked 546   Endurance additional comments Patient required two seated rest breaks due to pain in the L LE.     Berg Balance Test   Sit to Stand Able to stand  independently using hands   Standing Unsupported Able to stand safely 2 minutes   Sitting with Back Unsupported but Feet Supported on Floor or Stool Able to sit safely and securely 2 minutes   Stand to Sit Sits safely with minimal use of hands   Transfers Able to transfer safely, minor use of hands   Standing Unsupported with Eyes Closed Able to stand 10 seconds safely   Standing Ubsupported with Feet Together Able to place feet together independently and stand 1 minute safely   From Standing, Reach Forward with Outstretched Arm Reaches  forward but needs supervision  4"   From Standing Position, Pick up Object from [place] to pick up shoe, needs supervision   From Standing Position, Turn to Look Behind Over each Shoulder Looks behind from both sides and weight shifts well   Turn 360 Degrees Able to turn 360 degrees safely but slowly   Standing Unsupported, Alternately Place Feet on Step/Stool Needs assistance to keep from falling or unable to try   Standing Unsupported, One Foot in [place] to take small step independently and hold 30 seconds   Standing on One Leg Tries to lift leg/unable to hold 3 seconds but remains standing independently   Total Score 40           PT Short Term Goals - 04/16/16 1610      PT SHORT TERM GOAL #1   Title Patient demonstrates proper donning & verbalizes proper care. (Target Date: 04/19/2016)   Baseline MET 04/16/2016   Time 1   Period Months   Status Achieved     PT SHORT TERM GOAL #2   Title Patient tolerates  wear >10hrs total wear/day without skin issues. (Target Date: 04/19/2016)   Baseline MET 04/16/2016   Time 1   Period Months   Status Achieved     PT SHORT TERM GOAL #3   Title Patient ambulates 300' with RW & prosthesis modified independent. (Target Date: 04/19/2016)   Baseline MET 04/16/2016   Time 1   Period Months   Status Achieved     PT SHORT TERM GOAL #4   Title Pateint negotiates ramps, curbs & stairs (2 rails) with RW & prosthesis modified independent. (Target Date: 04/19/2016)   Baseline MET 04/16/2016   Time 1   Period Months   Status Achieved     PT SHORT TERM GOAL #5   Title Patient reaches 7" anteriorly & to floor without balance loss with supervision. (Target Date: 04/19/2016)   Baseline MET 04/16/2016   Time 1   Period Months   Status Achieved           PT Long Term Goals - 05/08/16 0951      PT LONG TERM GOAL #1   Title Patient verbalizes & demonstrates understanding of prosthetic care to enable safe use of prosthesis. (Target Date: 05/17/2016)   Baseline 05/08/2016 Met   Time 2   Period Months   Status Achieved     PT LONG TERM GOAL #2   Title Patient tolerates wear >90% of awake hours without skin integrity issues or limb tenderness to enable function throughout her day. (Target Date: 05/17/2016)   Baseline 05/08/2016 Met   Time 2   Period Months   Status Achieved     PT LONG TERM GOAL #3   Title Berg Balance >/= 45/56 to indicate lower fall risk. (Target Date: 05/17/2016)   Baseline 05/08/2016 Score today 40/56   Time 2   Period Months   Status On-going     PT LONG TERM GOAL #4   Title Patient ambulates 500' with LRAD & prosthesis including grass modified indepedent to enable function in community. (Target Date: 03/17/2016)   Baseline 05/08/2016 Outside ambulation not observed today. Based on previous sessions pt requires supervision with ambulation and cannot ambulate 500' without rest breaks.   Time 2   Period Months   Status On-going     PT  LONG TERM GOAL #5   Title Patient negotiates ramps, curbs & stairs (1 rail) modified independent to enable  community access. (Target Date: 05/17/2016)   Baseline 05/08/2016 Not observed today. Based on previous sessions patient required supervision.   Time 2   Period Months   Status On-going           Plan - 05/08/16 1148    Clinical Impression Statement Today's session focused on checking goals for primary PT to complete renewal. Patient has achieved 2 of 5 LTG's, however may still have potential for improvement.    Rehab Potential Good   PT Frequency 2x / week   PT Duration Other (comment)  9 weeks (60 days)   PT Treatment/Interventions ADLs/Self Care Home Management;Gait training;Stair training;Functional mobility training;Therapeutic activities;Therapeutic exercise;Balance training;Neuromuscular re-education;Patient/family education;Prosthetic Training   PT Next Visit Plan Continue gait without cane including negotiating barriers & and use of cane with outdoors, balance activities in SLS   Consulted and Agree with Plan of Care Patient      Patient will benefit from skilled therapeutic intervention in order to improve the following deficits and impairments:  Abnormal gait, Decreased activity tolerance, Decreased balance, Decreased endurance, Decreased knowledge of use of DME, Decreased mobility, Decreased strength, Postural dysfunction, Prosthetic Dependency, Pain  Visit Diagnosis: Other abnormalities of gait and mobility  Unsteadiness on feet  Muscle weakness (generalized)  Other symptoms and signs involving the musculoskeletal system     Problem List Patient Active Problem List   Diagnosis Date Noted  . Transaminitis 12/06/2015  . Acute on chronic renal failure ([place]) 12/06/2015  . History of renal transplant 12/06/2015  . UTI (lower urinary tract infection) 12/06/2015  . Acute cholecystitis 12/06/2015  . Below knee amputation status ([place]) 11/29/2015  . Asystole  ([place])   . S/P unilateral BKA (below knee amputation) ([place])   . Pericardial effusion   . Type 2 diabetes mellitus with peripheral neuropathy ([facility])   . Chronic kidney disease, stage IV (severe) ([place])   . Essential hypertension   . Anemia of chronic disease   . Acute blood loss anemia   . Gangrene associated with diabetes mellitus ([place]) 10/13/2015  . Critical lower limb ischemia 10/05/2015  . PAD (peripheral artery disease) ([place]) 09/18/2015  . Leukopenia 08/22/2014  . Protein-calorie malnutrition, severe ([place]) 12/24/2012  . Gastric ulcer 12/24/2012  . Mechanical complication of other vascular device, implant, and graft 09/22/2012  . End stage renal disease ([place]) 10/23/2011  . Physical deconditioning 10/09/2011  . HTN (hypertension), malignant 09/27/2011  . Chronic kidney disease, stage 4, severely decreased GFR ([facility]) 09/27/2011  . PNA (pneumonia) 09/27/2011  . Acute respiratory failure with hypoxia ([place]) 09/27/2011  . VOMITING 12/06/2009  . GOUT 12/21/2008  . Uncontrolled type 2 diabetes mellitus with peripheral artery disease ([place]) 08/14/2006  . HYPERLIPIDEMIA 08/14/2006  . OBESITY, NOS 08/14/2006  . FIBROADENOSIS, BREAST 08/14/2006  . Irregular menstrual cycle 08/14/2006    Benjiman Core, SPTA 05/08/2016, 12:00 PM  [facility] [address] [place], [place], [address] Phone: [phone]   Fax:  [phone]  Name: [name] MRN: [id number] Date of Birth: October 14, 1970

## 2016-05-10 ENCOUNTER — Encounter (HOSPITAL_COMMUNITY): Payer: Self-pay

## 2016-05-13 ENCOUNTER — Other Ambulatory Visit: Payer: Self-pay | Admitting: Internal Medicine

## 2016-05-13 DIAGNOSIS — Z1231 Encounter for screening mammogram for malignant neoplasm of breast: Secondary | ICD-10-CM

## 2016-05-14 ENCOUNTER — Telehealth (INDEPENDENT_AMBULATORY_CARE_PROVIDER_SITE_OTHER): Payer: Self-pay | Admitting: Orthopedic Surgery

## 2016-05-14 DIAGNOSIS — E119 Type 2 diabetes mellitus without complications: Secondary | ICD-10-CM | POA: Diagnosis not present

## 2016-05-14 NOTE — Telephone Encounter (Signed)
Patient is sitting in the HR office of her job with [employer] waiting on a form to be faxed to [phone] to the attention of Gwenyth Ober so that she can clock in.  Patient works from [employer].  She states she dropped the form off yesterday and indicated that is was needed ASAP and the front desk person attached a note. Please respond by faxing form or calling her if there is an issue or problem allowing her to RTW now.

## 2016-05-14 NOTE — Telephone Encounter (Signed)
Pt called back about this note

## 2016-05-14 NOTE — Telephone Encounter (Signed)
Given to patient in office and faxed to [facility] per patient request

## 2016-05-15 ENCOUNTER — Encounter: Payer: Self-pay | Admitting: Physical Therapy

## 2016-05-15 ENCOUNTER — Ambulatory Visit: Payer: Medicare Other | Admitting: Physical Therapy

## 2016-05-15 DIAGNOSIS — R29898 Other symptoms and signs involving the musculoskeletal system: Secondary | ICD-10-CM | POA: Diagnosis not present

## 2016-05-15 DIAGNOSIS — R2681 Unsteadiness on feet: Secondary | ICD-10-CM

## 2016-05-15 DIAGNOSIS — M6281 Muscle weakness (generalized): Secondary | ICD-10-CM | POA: Diagnosis not present

## 2016-05-15 DIAGNOSIS — R2689 Other abnormalities of gait and mobility: Secondary | ICD-10-CM

## 2016-05-15 NOTE — Therapy (Signed)
[facility] [address] [place], [place], [address] Phone: [phone]   Fax:  [phone]  Physical Therapy Treatment  Patient Details  Name: [name] MRN: [id number] Date of Birth: 1971/04/15 Referring Provider: Meridee Score, MD  Encounter Date: 05/15/2016      PT End of Session - 05/15/16 1110    Visit Number 17   Number of Visits 18   Date for PT Re-Evaluation 05/17/16   Authorization Type Medicare G-Code & progress note required   PT Start Time 0803   PT Stop Time 0843   PT Time Calculation (min) 40 min   Equipment Utilized During Treatment Gait belt   Activity Tolerance Patient tolerated treatment well;Patient limited by fatigue;Patient limited by pain   Behavior During Therapy [facility] for tasks assessed/performed      Past Medical History:  Diagnosis Date  . Anemia   . CHF (congestive heart failure) ([place])   . Critical ischemia of lower extremity hospitalized 10/05/2015   right  . Diabetic neuropathy ([place])   . ESRD (end stage renal disease) on dialysis ([place]) 09/28/2011-01/23/2013   M-W-F; [place]  . GERD (gastroesophageal reflux disease)   . High cholesterol   . History of blood transfusion    related to "kidney transplant"  . Hypertension    sees Dr. Carolin Guernsey  . Metabolic bone disease   . Peripheral vascular disease ([place])   . Pneumonia 09/27/2011  . Retinopathy   . Type II diabetes mellitus ([place])    "controlled with diet"    Past Surgical History:  Procedure Laterality Date  . AMPUTATION Right 10/13/2015   Procedure: Right Transmetatarsal Amputation;  Surgeon: Newt Minion, MD;  Location: [facility];  Service: Orthopedics;  Laterality: Right;  . AMPUTATION Right 11/29/2015   Procedure: RIGHT BELOW KNEE AMPUTATION;  Surgeon: Newt Minion, MD;  Location: [facility];  Service: Orthopedics;  Laterality: Right;  . AV FISTULA PLACEMENT  10/04/2011   Procedure: INSERTION OF ARTERIOVENOUS (AV)  GORE-TEX GRAFT ARM;  Surgeon: Angelia Mould, MD;  Location: [facility];  Service: Vascular;  Laterality: Left;  Insertion left upper arm Arteriovenous goretex graft  . AV FISTULA PLACEMENT  10/29/2011   Procedure: ARTERIOVENOUS (AV) FISTULA CREATION;  Surgeon: Angelia Mould, MD;  Location: [facility] OR;  Service: Vascular;  Laterality: Right;  Creation Right Arteriovenous Fistula   . [place] REMOVAL  10/04/2011   Procedure: REMOVAL OF ARTERIOVENOUS GORETEX GRAFT ([place]);  Surgeon: Elam Dutch, MD;  Location: [facility];  Service: Vascular;  Laterality: Left;  . CHOLECYSTECTOMY N/A 12/08/2015   Procedure: LAPAROSCOPIC CHOLECYSTECTOMY WITH INTRAOPERATIVE CHOLANGIOGRAM;  Surgeon: Stark Klein, MD;  Location: [facility];  Service: General;  Laterality: N/A;  . ESOPHAGOGASTRODUODENOSCOPY N/A 12/24/2012   Procedure: ESOPHAGOGASTRODUODENOSCOPY (EGD);  Surgeon: Milus Banister, MD;  Location: [facility];  Service: Endoscopy;  Laterality: N/A;  . FINGER AMPUTATION Right 02/26/2013   "3rd finger"  . INSERTION OF DIALYSIS CATHETER  10/04/2011   Procedure: INSERTION OF DIALYSIS CATHETER;  Surgeon: Angelia Mould, MD;  Location: [facility];  Service: Vascular;  Laterality: Right;  insertion of dialysis catheter right internal jugular  . INSERTION OF DIALYSIS CATHETER  06/23/2012   Procedure: INSERTION OF DIALYSIS CATHETER;  Surgeon: Angelia Mould, MD;  Location: [facility];  Service: Vascular;  Laterality: N/A;  Ultrasound guided  . KIDNEY TRANSPLANT  01/24/2013  . PATCH ANGIOPLASTY  06/23/2012   Procedure: PATCH ANGIOPLASTY;  Surgeon: Angelia Mould, MD;  [facility]: [facility];  Service: Vascular;  Laterality: Right;  . PERIPHERAL VASCULAR CATHETERIZATION N/A 09/19/2015   Procedure: Lower Extremity Angiography;  Surgeon: Adrian Prows, MD;  Location: [facility];  Service: Cardiovascular;  Laterality: N/A;  . PERIPHERAL VASCULAR CATHETERIZATION N/A 09/19/2015   Procedure: Abdominal Aortogram;  Surgeon: Adrian Prows,  MD;  Location: [facility];  Service: Cardiovascular;  Laterality: N/A;  . PERIPHERAL VASCULAR CATHETERIZATION Right 09/19/2015   Procedure: Peripheral Vascular Intervention;  Surgeon: Adrian Prows, MD;  Location: [facility] [place] [facility];  Service: Cardiovascular;  Laterality: Right;  Right Common  Iliac  . PERIPHERAL VASCULAR CATHETERIZATION N/A 10/06/2015   Procedure: Lower Extremity Angiography;  Surgeon: Adrian Prows, MD;  Location: [facility];  Service: Cardiovascular;  Laterality: N/A;  . PERIPHERAL VASCULAR CATHETERIZATION  10/06/2015   Procedure: Peripheral Vascular Intervention;  Surgeon: Adrian Prows, MD;  Location: [facility];  Service: Cardiovascular;;  REIA  Omnilink 6.0x29, innova 7x80  RSFA innova 5x80  . REVISON OF ARTERIOVENOUS FISTULA  06/23/2012   Procedure: REVISON OF ARTERIOVENOUS FISTULA;  Surgeon: Angelia Mould, MD;  Location: [facility];  Service: Vascular;  Laterality: Right;  Ultrasound guided  . SHUNTOGRAM N/A 04/06/2012   Procedure: Earney [place];  Surgeon: Angelia Mould, MD;  Location: [facility];  Service: Cardiovascular;  Laterality: N/A;    There were no vitals filed for this visit.      Subjective Assessment - 05/15/16 0806    Subjective Patient reports she will be returning to work today for 4 hours.   Pertinent History IDDM2 with neuropathy & retinopathy, kidney transplant (01/24/2013), respiratory failure 3X (09/27/2011, 10/14/2015 & 12/06/2015) hypoxia, HTN, CHF, PVD, gout (pt denies),    Limitations Lifting;Standing;Walking   Patient Stated Goals She wants to return to "normal" walking, driving, going out in community, working, going concerts, amusement parks.   Currently in Pain? No/denies       High Level Balance Activities  Tapping to 5 foam bubbles placed in semi circle around patient; 3x each LE; Intermittent UE support in parallel bars; frequent LOB Min assist; Visual and verbal cues for technique.  Standing across two black foam beams  (one placed in front of the other to prevent heels and toes from hanging off). Head turns and nods 3x 30 seconds each way.  Anterior and posterior steps off beam, with static LE remaining on beam; Intermittent UE support from parallel bars; Blocked practice; min assist for balance. VC for feet placement and weigh shifting.  Anterior steps on ramp, facing ascending and descending; 20x each way; Intermittent handheld assist for balance.        PT Short Term Goals - 04/16/16 1610      PT SHORT TERM GOAL #1   Title Patient demonstrates proper donning & verbalizes proper care. (Target Date: 04/19/2016)   Baseline MET 04/16/2016   Time 1   Period Months   Status Achieved     PT SHORT TERM GOAL #2   Title Patient tolerates wear >10hrs total wear/day without skin issues. (Target Date: 04/19/2016)   Baseline MET 04/16/2016   Time 1   Period Months   Status Achieved     PT SHORT TERM GOAL #3   Title Patient ambulates 300' with RW & prosthesis modified independent. (Target Date: 04/19/2016)   Baseline MET 04/16/2016   Time 1   Period Months   Status Achieved     PT SHORT TERM GOAL #4   Title Pateint  negotiates ramps, curbs & stairs (2 rails) with RW & prosthesis modified independent. (Target Date: 04/19/2016)   Baseline MET 04/16/2016   Time 1   Period Months   Status Achieved     PT SHORT TERM GOAL #5   Title Patient reaches 7" anteriorly & to floor without balance loss with supervision. (Target Date: 04/19/2016)   Baseline MET 04/16/2016   Time 1   Period Months   Status Achieved           PT Long Term Goals - 05/08/16 0951      PT LONG TERM GOAL #1   Title Patient verbalizes & demonstrates understanding of prosthetic care to enable safe use of prosthesis. (Target Date: 05/17/2016)   Baseline 05/08/2016 Met   Time 2   Period Months   Status Achieved     PT LONG TERM GOAL #2   Title Patient tolerates wear >90% of awake hours without skin integrity issues or limb tenderness  to enable function throughout her day. (Target Date: 05/17/2016)   Baseline 05/08/2016 Met   Time 2   Period Months   Status Achieved     PT LONG TERM GOAL #3   Title Berg Balance >/= 45/56 to indicate lower fall risk. (Target Date: 05/17/2016)   Baseline 05/08/2016 Score today 40/56   Time 2   Period Months   Status On-going     PT LONG TERM GOAL #4   Title Patient ambulates 500' with LRAD & prosthesis including grass modified indepedent to enable function in community. (Target Date: 03/17/2016)   Baseline 05/08/2016 Outside ambulation not observed today. Based on previous sessions pt requires supervision with ambulation and cannot ambulate 500' without rest breaks.   Time 2   Period Months   Status On-going     PT LONG TERM GOAL #5   Title Patient negotiates ramps, curbs & stairs (1 rail) modified independent to enable community access. (Target Date: 05/17/2016)   Baseline 05/08/2016 Not observed today. Based on previous sessions patient required supervision.   Time 2   Period Months   Status On-going           Plan - 05/15/16 1111    Clinical Impression Statement Today's skilled session focused on high-level balance activities. Patient continues to be challenged with balance and fatigues quickly with balance activities. Patient arrived to clinic with no cane and antalgic gait. Patient reported wearing her prosthesis from 8am - 11pm yesterday and doing a lot of walking. She reported her R LE as feeling heavy. Advised patient use cane on busier days to prevent fatigue and antalgic gait.   Rehab Potential Good   PT Frequency 2x / week   PT Duration Other (comment)  9 weeks (60 days)   PT Treatment/Interventions ADLs/Self Care Home Management;Gait training;Stair training;Functional mobility training;Therapeutic activities;Therapeutic exercise;Balance training;Neuromuscular re-education;Patient/family education;Prosthetic Training   PT Next Visit Plan Continue gait without cane  including negotiating barriers & and use of cane with outdoors, balance activities in SLS   Consulted and Agree with Plan of Care Patient      Patient will benefit from skilled therapeutic intervention in order to improve the following deficits and impairments:  Abnormal gait, Decreased activity tolerance, Decreased balance, Decreased endurance, Decreased knowledge of use of DME, Decreased mobility, Decreased strength, Postural dysfunction, Prosthetic Dependency, Pain  Visit Diagnosis: Other abnormalities of gait and mobility  Unsteadiness on feet  Muscle weakness (generalized)  Other symptoms and signs involving the musculoskeletal system  Problem List Patient Active Problem List   Diagnosis Date Noted  . Transaminitis 12/06/2015  . Acute on chronic renal failure ([place]) 12/06/2015  . History of renal transplant 12/06/2015  . UTI (lower urinary tract infection) 12/06/2015  . Acute cholecystitis 12/06/2015  . Below knee amputation status ([place]) 11/29/2015  . Asystole ([place])   . S/P unilateral BKA (below knee amputation) ([place])   . Pericardial effusion   . Type 2 diabetes mellitus with peripheral neuropathy ([facility])   . Chronic kidney disease, stage IV (severe) ([place])   . Essential hypertension   . Anemia of chronic disease   . Acute blood loss anemia   . Gangrene associated with diabetes mellitus ([place]) 10/13/2015  . Critical lower limb ischemia 10/05/2015  . PAD (peripheral artery disease) ([place]) 09/18/2015  . Leukopenia 08/22/2014  . Protein-calorie malnutrition, severe ([place]) 12/24/2012  . Gastric ulcer 12/24/2012  . Mechanical complication of other vascular device, implant, and graft 09/22/2012  . End stage renal disease ([place]) 10/23/2011  . Physical deconditioning 10/09/2011  . HTN (hypertension), malignant 09/27/2011  . Chronic kidney disease, stage 4, severely decreased GFR ([facility]) 09/27/2011  . PNA (pneumonia) 09/27/2011  . Acute respiratory failure with hypoxia ([place])  09/27/2011  . VOMITING 12/06/2009  . GOUT 12/21/2008  . Uncontrolled type 2 diabetes mellitus with peripheral artery disease ([place]) 08/14/2006  . HYPERLIPIDEMIA 08/14/2006  . OBESITY, NOS 08/14/2006  . FIBROADENOSIS, BREAST 08/14/2006  . Irregular menstrual cycle 08/14/2006    Benjiman Core, SPTA 05/15/2016, 11:30 AM  [facility] [address] [place], [place], [address] Phone: [phone]   Fax:  [phone]  Name: [name] MRN: [id number] Date of Birth: Apr 21, 1971

## 2016-05-17 ENCOUNTER — Ambulatory Visit: Payer: Medicare Other | Attending: Orthopedic Surgery | Admitting: Physical Therapy

## 2016-05-17 ENCOUNTER — Encounter: Payer: Self-pay | Admitting: Physical Therapy

## 2016-05-17 DIAGNOSIS — R29898 Other symptoms and signs involving the musculoskeletal system: Secondary | ICD-10-CM

## 2016-05-17 DIAGNOSIS — M6281 Muscle weakness (generalized): Secondary | ICD-10-CM

## 2016-05-17 DIAGNOSIS — R2681 Unsteadiness on feet: Secondary | ICD-10-CM | POA: Insufficient documentation

## 2016-05-17 DIAGNOSIS — R2689 Other abnormalities of gait and mobility: Secondary | ICD-10-CM | POA: Diagnosis not present

## 2016-05-17 NOTE — Therapy (Signed)
[facility] [address] [place], [place], [address] Phone: [phone]   Fax:  [phone]  Physical Therapy Treatment  Patient Details  Name: [name] MRN: [id number] Date of Birth: Sep 08, 1970 Referring Provider: Meridee Score, MD  Encounter Date: 05/17/2016      PT End of Session - 05/17/16 1107    Visit Number 18   Number of Visits 18   Date for PT Re-Evaluation 05/17/16   Authorization Type Medicare G-Code & progress note required   PT Start Time 0800   PT Stop Time 0841   PT Time Calculation (min) 41 min   Equipment Utilized During Treatment Gait belt   Activity Tolerance Patient tolerated treatment well;Patient limited by fatigue;Patient limited by pain   Behavior During Therapy [facility] for tasks assessed/performed      Past Medical History:  Diagnosis Date  . Anemia   . CHF (congestive heart failure) ([place])   . Critical ischemia of lower extremity hospitalized 10/05/2015   right  . Diabetic neuropathy ([place])   . ESRD (end stage renal disease) on dialysis ([place]) 09/28/2011-01/23/2013   M-W-F; [place]  . GERD (gastroesophageal reflux disease)   . High cholesterol   . History of blood transfusion    related to "kidney transplant"  . Hypertension    sees Dr. Carolin Guernsey  . Metabolic bone disease   . Peripheral vascular disease ([place])   . Pneumonia 09/27/2011  . Retinopathy   . Type II diabetes mellitus ([place])    "controlled with diet"    Past Surgical History:  Procedure Laterality Date  . AMPUTATION Right 10/13/2015   Procedure: Right Transmetatarsal Amputation;  Surgeon: Newt Minion, MD;  Location: [place];  Service: Orthopedics;  Laterality: Right;  . AMPUTATION Right 11/29/2015   Procedure: RIGHT BELOW KNEE AMPUTATION;  Surgeon: Newt Minion, MD;  Location: [place];  Service: Orthopedics;  Laterality: Right;  . AV FISTULA PLACEMENT  10/04/2011   Procedure: INSERTION OF ARTERIOVENOUS (AV) GORE-TEX  GRAFT ARM;  Surgeon: Angelia Mould, MD;  Location: [place];  Service: Vascular;  Laterality: Left;  Insertion left upper arm Arteriovenous goretex graft  . AV FISTULA PLACEMENT  10/29/2011   Procedure: ARTERIOVENOUS (AV) FISTULA CREATION;  Surgeon: Angelia Mould, MD;  Location: [facility] OR;  Service: Vascular;  Laterality: Right;  Creation Right Arteriovenous Fistula   . [place] REMOVAL  10/04/2011   Procedure: REMOVAL OF ARTERIOVENOUS GORETEX GRAFT (Long);  Surgeon: Elam Dutch, MD;  Location: [place];  Service: Vascular;  Laterality: Left;  . CHOLECYSTECTOMY N/A 12/08/2015   Procedure: LAPAROSCOPIC CHOLECYSTECTOMY WITH INTRAOPERATIVE CHOLANGIOGRAM;  Surgeon: Stark Klein, MD;  Location: [place];  Service: General;  Laterality: N/A;  . ESOPHAGOGASTRODUODENOSCOPY N/A 12/24/2012   Procedure: ESOPHAGOGASTRODUODENOSCOPY (EGD);  Surgeon: Milus Banister, MD;  Location: [place];  Service: Endoscopy;  Laterality: N/A;  . FINGER AMPUTATION Right 02/26/2013   "3rd finger"  . INSERTION OF DIALYSIS CATHETER  10/04/2011   Procedure: INSERTION OF DIALYSIS CATHETER;  Surgeon: Angelia Mould, MD;  Location: [facility];  Service: Vascular;  Laterality: Right;  insertion of dialysis catheter right internal jugular  . INSERTION OF DIALYSIS CATHETER  06/23/2012   Procedure: INSERTION OF DIALYSIS CATHETER;  Surgeon: Angelia Mould, MD;  Location: [place];  Service: Vascular;  Laterality: N/A;  Ultrasound guided  . KIDNEY TRANSPLANT  01/24/2013  . PATCH ANGIOPLASTY  06/23/2012   Procedure: PATCH ANGIOPLASTY;  Surgeon: Angelia Mould, MD;  [facility];  Service: Vascular;  Laterality: Right;  . PERIPHERAL VASCULAR CATHETERIZATION N/A 09/19/2015   Procedure: Lower Extremity Angiography;  Surgeon: Adrian Prows, MD;  Location: [place] CV [facility];  Service: Cardiovascular;  Laterality: N/A;  . PERIPHERAL VASCULAR CATHETERIZATION N/A 09/19/2015   Procedure: Abdominal Aortogram;  Surgeon: Adrian Prows, MD;   Location: [place] CV LAB;  Service: Cardiovascular;  Laterality: N/A;  . PERIPHERAL VASCULAR CATHETERIZATION Right 09/19/2015   Procedure: Peripheral Vascular Intervention;  Surgeon: Adrian Prows, MD;  Location: [place] CV LAB;  Service: Cardiovascular;  Laterality: Right;  Right Common  Iliac  . PERIPHERAL VASCULAR CATHETERIZATION N/A 10/06/2015   Procedure: Lower Extremity Angiography;  Surgeon: Adrian Prows, MD;  Location: [place] CV LAB;  Service: Cardiovascular;  Laterality: N/A;  . PERIPHERAL VASCULAR CATHETERIZATION  10/06/2015   Procedure: Peripheral Vascular Intervention;  Surgeon: Adrian Prows, MD;  Location: [place] CV LAB;  Service: Cardiovascular;;  REIA  Omnilink 6.0x29, innova 7x80  RSFA innova 5x80  . REVISON OF ARTERIOVENOUS FISTULA  06/23/2012   Procedure: REVISON OF ARTERIOVENOUS FISTULA;  Surgeon: Angelia Mould, MD;  Location: [place];  Service: Vascular;  Laterality: Right;  Ultrasound guided  . SHUNTOGRAM N/A 04/06/2012   Procedure: Earney Mallet;  Surgeon: Angelia Mould, MD;  Location: [facility] CATH LAB;  Service: Cardiovascular;  Laterality: N/A;    There were no vitals filed for this visit.      Subjective Assessment - 05/17/16 0806    Subjective Patient reports she has been itching a lot under her liner. Shae butter seems to be the only thing that soothes it.   Patient is accompained by: Family member   Pertinent History IDDM2 with neuropathy & retinopathy, kidney transplant (01/24/2013), respiratory failure 3X (09/27/2011, 10/14/2015 & 12/06/2015) hypoxia, HTN, CHF, PVD, gout (pt denies),    Limitations Lifting;Standing;Walking   Patient Stated Goals She wants to return to "normal" walking, driving, going out in community, working, going concerts, amusement parks.   Currently in Pain? No/denies     High level balance activities  Tapping to 5 foam bubbles placed in semi circle around patient; 3x each LE; Intermittent UE support in parallel bars; frequent LOB  Min assist; Visual and verbal cues for technique.  Lateral rocking on rocker board to encourage weight shift to prosthetic side; VC for posture; 3x 30 seconds; During activity pt became aware of pressure in distal residual limb and was prompted to apply additional ply.  Anterior steps on ramp, facing ascending and descending; 20x each way; min assist at shoulder girdle and hips for balance.       [facility] Adult PT Treatment/Exercise - 05/17/16 0807      Prosthetics   Prosthetic Care Comments  Required ques for sock ply adjustment during session due to increased pain during activity.   Current prosthetic wear tolerance (days/week)  daily   Current prosthetic wear tolerance (#hours/day)  all awake hours, drying mid-day & prn   Residual limb condition  minor skin break down from scratching; Advised pt begin using anti persperant and cleaning liner with gental hypoalergenic soap. Use lotion at night and wipe off in the morning before donning prosthesis.   Education Provided Skin check;Residual limb care;Prosthetic cleaning;Correct ply sock adjustment   Person(s) Educated Patient   Education Method Explanation   Education Method Verbalized understanding   Donning Prosthesis Modified independent (device/increased time)   Doffing Prosthesis Modified independent (device/increased time)           PT  Short Term Goals - 04/16/16 1610      PT SHORT TERM GOAL #1   Title Patient demonstrates proper donning & verbalizes proper care. (Target Date: 04/19/2016)   Baseline MET 04/16/2016   Time 1   Period Months   Status Achieved     PT SHORT TERM GOAL #2   Title Patient tolerates wear >10hrs total wear/day without skin issues. (Target Date: 04/19/2016)   Baseline MET 04/16/2016   Time 1   Period Months   Status Achieved     PT SHORT TERM GOAL #3   Title Patient ambulates 300' with RW & prosthesis modified independent. (Target Date: 04/19/2016)   Baseline MET 04/16/2016   Time 1   Period Months    Status Achieved     PT SHORT TERM GOAL #4   Title Pateint negotiates ramps, curbs & stairs (2 rails) with RW & prosthesis modified independent. (Target Date: 04/19/2016)   Baseline MET 04/16/2016   Time 1   Period Months   Status Achieved     PT SHORT TERM GOAL #5   Title Patient reaches 7" anteriorly & to floor without balance loss with supervision. (Target Date: 04/19/2016)   Baseline MET 04/16/2016   Time 1   Period Months   Status Achieved           PT Long Term Goals - 05/08/16 0951      PT LONG TERM GOAL #1   Title Patient verbalizes & demonstrates understanding of prosthetic care to enable safe use of prosthesis. (Target Date: 05/17/2016)   Baseline 05/08/2016 Met   Time 2   Period Months   Status Achieved     PT LONG TERM GOAL #2   Title Patient tolerates wear >90% of awake hours without skin integrity issues or limb tenderness to enable function throughout her day. (Target Date: 05/17/2016)   Baseline 05/08/2016 Met   Time 2   Period Months   Status Achieved     PT LONG TERM GOAL #3   Title Berg Balance >/= 45/56 to indicate lower fall risk. (Target Date: 05/17/2016)   Baseline 05/08/2016 Score today 40/56   Time 2   Period Months   Status On-going     PT LONG TERM GOAL #4   Title Patient ambulates 500' with LRAD & prosthesis including grass modified indepedent to enable function in community. (Target Date: 03/17/2016)   Baseline 05/08/2016 Outside ambulation not observed today. Based on previous sessions pt requires supervision with ambulation and cannot ambulate 500' without rest breaks.   Time 2   Period Months   Status On-going     PT LONG TERM GOAL #5   Title Patient negotiates ramps, curbs & stairs (1 rail) modified independent to enable community access. (Target Date: 05/17/2016)   Baseline 05/08/2016 Not observed today. Based on previous sessions patient required supervision.   Time 2   Period Months   Status On-going           Plan -  05/17/16 1108    Clinical Impression Statement Today's skilled session continued to focus on high level balance activities along with edu on residual limb care. Patient becoming frustrated with difficulty of balance activities and fatiguing quickly. She is steadily progressing towards goals and should benefit from continued PT to achieve unmet goals    Rehab Potential Good   PT Frequency 2x / week   PT Duration Other (comment)  9 weeks (60 days)   PT Treatment/Interventions ADLs/Self Care Home Management;Gait  training;Stair training;Functional mobility training;Therapeutic activities;Therapeutic exercise;Balance training;Neuromuscular re-education;Patient/family education;Prosthetic Training   PT Next Visit Plan Continue gait without cane including negotiating barriers & and use of cane with outdoors, balance activities in SLS   Consulted and Agree with Plan of Care Patient      Patient will benefit from skilled therapeutic intervention in order to improve the following deficits and impairments:  Abnormal gait, Decreased activity tolerance, Decreased balance, Decreased endurance, Decreased knowledge of use of DME, Decreased mobility, Decreased strength, Postural dysfunction, Prosthetic Dependency, Pain  Visit Diagnosis: Other abnormalities of gait and mobility  Unsteadiness on feet  Muscle weakness (generalized)  Other symptoms and signs involving the musculoskeletal system     Problem List Patient Active Problem List   Diagnosis Date Noted  . Transaminitis 12/06/2015  . Acute on chronic renal failure ([place]) 12/06/2015  . History of renal transplant 12/06/2015  . UTI (lower urinary tract infection) 12/06/2015  . Acute cholecystitis 12/06/2015  . Below knee amputation status ([place]) 11/29/2015  . Asystole ([place])   . S/P unilateral BKA (below knee amputation) ([place])   . Pericardial effusion   . Type 2 diabetes mellitus with peripheral neuropathy (H[facility])   . Chronic kidney disease, stage  IV (severe) ([place])   . Essential hypertension   . Anemia of chronic disease   . Acute blood loss anemia   . Gangrene associated with diabetes mellitus ([place]) 10/13/2015  . Critical lower limb ischemia 10/05/2015  . PAD (peripheral artery disease) ([place]) 09/18/2015  . Leukopenia 08/22/2014  . Protein-calorie malnutrition, severe ([place]) 12/24/2012  . Gastric ulcer 12/24/2012  . Mechanical complication of other vascular device, implant, and graft 09/22/2012  . End stage renal disease ([place]) 10/23/2011  . Physical deconditioning 10/09/2011  . HTN (hypertension), malignant 09/27/2011  . Chronic kidney disease, stage 4, severely decreased GFR (HCC) 09/27/2011  . PNA (pneumonia) 09/27/2011  . Acute respiratory failure with hypoxia ([place]) 09/27/2011  . VOMITING 12/06/2009  . GOUT 12/21/2008  . Uncontrolled type 2 diabetes mellitus with peripheral artery disease ([place]) 08/14/2006  . HYPERLIPIDEMIA 08/14/2006  . OBESITY, NOS 08/14/2006  . FIBROADENOSIS, BREAST 08/14/2006  . Irregular menstrual cycle 08/14/2006    Benjiman Core, SPTA 05/17/2016, 11:12 AM  [facility] [address] [place], [place], [address] Phone: [phone]   Fax:  [phone]  Name: [name] MRN: [id number] Date of Birth: 1970-12-22

## 2016-05-20 ENCOUNTER — Ambulatory Visit: Payer: Self-pay

## 2016-05-20 ENCOUNTER — Other Ambulatory Visit: Payer: Self-pay

## 2016-05-20 NOTE — Patient Outreach (Signed)
[facility]) Care Management  05/20/2016  [name] [name] 1971/03/21 [id number]   Referral Date:  05/08/2016 Source:  Emmi Issue:  DM, HTN, Renal Failure, Irregular Heart.  Falls: yes due to learning to walk with new prosthesis. Admissions:  2 ED: 0 PCP:  Dr. Benito Mccreedy - last appt 05/03/16 [facility] Provider:  H/o past services with [facility]   MR Review: H/o ESRD s/p renal transplant in 2014, IDDM, PVD, BKA on 11/29/2015, anemia secondary to CKD, HTN, GERD  [place] [address] [phone] (M) [phone] (H)  Outreach call #1 to patient.  Contact reached and states patient just left to go to church.   RN CM left HIPAA compliant message with name and number.  RN CM scheduled for next outreach call within one week.   Nathaneil Canary, BSN, RN, [place] Care Management Care Management Coordinator [phone] Direct [phone] Cell [phone] Office [phone] Fax [email or web address]

## 2016-05-21 ENCOUNTER — Other Ambulatory Visit: Payer: Self-pay

## 2016-05-21 NOTE — Patient Outreach (Addendum)
-  [place] [facility] [facility]) Care Management  05/21/2016  [name] [place] [birth year] [id number]   Referral Date:  05/08/2016 Source:  Emmi Issue:  DM, HTN, Renal Failure, Irregular Heart.  Falls: yes due to learning to walk with new prosthesis. Admissions:  2 ED: 0 PCP:  Dr. Benito Mccreedy - last appt 05/03/16 [facility] Provider:  H/o past services with [facility]   MR Review: H/o ESRD s/p renal transplant in 2014, IDDM, PVD, BKA on 11/29/2015, anemia secondary to CKD, HTN, GERD  [place] [address] [phone] (M) [phone] (H)  Outreach call #2 to patient.   Contact reached but states client is not available.  States client is temporarily staying at this number and can not be reached until after 2:00 pm.  RN CM left HIPAA compliant message with name and number.  RN CM scheduled for next outreach call within one week.   Nathaneil Canary, BSN, RN, [place] Management Care Management Coordinator [phone] Direct [phone] Cell [phone] Office [phone] Fax [email or web address]

## 2016-05-22 ENCOUNTER — Ambulatory Visit: Payer: Self-pay

## 2016-05-23 DIAGNOSIS — M2042 Other hammer toe(s) (acquired), left foot: Secondary | ICD-10-CM | POA: Diagnosis not present

## 2016-05-23 DIAGNOSIS — Z89511 Acquired absence of right leg below knee: Secondary | ICD-10-CM | POA: Diagnosis not present

## 2016-05-23 DIAGNOSIS — I739 Peripheral vascular disease, unspecified: Secondary | ICD-10-CM | POA: Diagnosis not present

## 2016-05-23 DIAGNOSIS — B351 Tinea unguium: Secondary | ICD-10-CM | POA: Diagnosis not present

## 2016-05-24 ENCOUNTER — Other Ambulatory Visit: Payer: Self-pay

## 2016-05-24 ENCOUNTER — Encounter: Payer: Self-pay | Admitting: Physical Therapy

## 2016-05-24 ENCOUNTER — Ambulatory Visit: Payer: Medicare Other | Admitting: Physical Therapy

## 2016-05-24 DIAGNOSIS — M6281 Muscle weakness (generalized): Secondary | ICD-10-CM | POA: Diagnosis not present

## 2016-05-24 DIAGNOSIS — R2681 Unsteadiness on feet: Secondary | ICD-10-CM | POA: Diagnosis not present

## 2016-05-24 DIAGNOSIS — R2689 Other abnormalities of gait and mobility: Secondary | ICD-10-CM | POA: Diagnosis not present

## 2016-05-24 DIAGNOSIS — R29898 Other symptoms and signs involving the musculoskeletal system: Secondary | ICD-10-CM | POA: Diagnosis not present

## 2016-05-24 NOTE — Patient Outreach (Addendum)
-  [facility]) Care Management  05/24/2016  Emanuella Nickle [place] Apr 19, 1971 [id number]  Telephonic Screening  Referral Date:  05/08/2016 Source:  Emmi Issue:  DM, HTN, Renal Failure, Irregular Heart.  Falls: yes due to learning to walk with new prosthesis. Admissions:  2 ED: 0 PCP:  Dr. Benito Mccreedy - last appt 05/03/16 [facility] Provider:  H/o past services with [facility]   MR Review: H/o ESRD s/p renal transplant in 2014, IDDM, PVD, BKA on 11/29/2015, anemia secondary to CKD, HTN, GERD  [place] [address] [phone] (M) [phone] (H)  Outreach call #3 to patient.   Contact reached but states patient is not available.  H/o patient  temporarily staying at this number.  Patient working. RN CM left HIPAA compliant message with name and number.  RN CM mailed Unsuccessful Outreach letter.   RN CM will close case in two weeks if no response received back from patient.   Nathaneil Canary, BSN, RN, [place] Management Care Management Coordinator [phone] Direct [phone] Cell [phone] Office [phone] Fax [email or web address]

## 2016-05-25 NOTE — Therapy (Signed)
[facility] [address] [place], [place], [address] Phone: [phone]   Fax:  [phone]  Physical Therapy Treatment  Patient Details  Name: MONIFAH FREEHLING MRN: [id number] Date of Birth: 1971-05-25 Referring Provider: Meridee Score, MD  Encounter Date: 05/24/2016   05/24/16 0817  PT Visits / Re-Eval  Visit Number 19  Number of Visits 26 (Increased due to renewal)  Date for PT Re-Evaluation 05/17/16  Authorization  Authorization Type Medicare G-Code & progress note required  PT Time Calculation  PT Start Time 0809 (pt running late today)  PT Stop Time 0845  PT Time Calculation (min) 36 min  PT - End of Session  Equipment Utilized During Treatment Gait belt  Activity Tolerance Patient tolerated treatment well;Patient limited by fatigue;Patient limited by pain  Behavior During Therapy [facility] for tasks assessed/performed     Past Medical History:  Diagnosis Date  . Anemia   . CHF (congestive heart failure) ([place])   . Critical ischemia of lower extremity hospitalized 10/05/2015   right  . Diabetic neuropathy ([place])   . ESRD (end stage renal disease) on dialysis ([place]) 09/28/2011-01/23/2013   M-W-F; [facility]  . GERD (gastroesophageal reflux disease)   . High cholesterol   . History of blood transfusion    related to "kidney transplant"  . Hypertension    sees Dr. Carolin Guernsey  . Metabolic bone disease   . Peripheral vascular disease ([place])   . Pneumonia 09/27/2011  . Retinopathy   . Type II diabetes mellitus ([place])    "controlled with diet"    Past Surgical History:  Procedure Laterality Date  . AMPUTATION Right 10/13/2015   Procedure: Right Transmetatarsal Amputation;  Surgeon: Newt Minion, MD;  Location: [facility];  Service: Orthopedics;  Laterality: Right;  . AMPUTATION Right 11/29/2015   Procedure: RIGHT BELOW KNEE AMPUTATION;  Surgeon: Newt Minion, MD;  Location: [facility] [facility];  Service: Orthopedics;  Laterality:  Right;  . AV FISTULA PLACEMENT  10/04/2011   Procedure: INSERTION OF ARTERIOVENOUS (AV) GORE-TEX GRAFT ARM;  Surgeon: Angelia Mould, MD;  Location: [facility];  Service: Vascular;  Laterality: Left;  Insertion left upper arm Arteriovenous goretex graft  . AV FISTULA PLACEMENT  10/29/2011   Procedure: ARTERIOVENOUS (AV) FISTULA CREATION;  Surgeon: Angelia Mould, MD;  Location: [facility] OR;  Service: Vascular;  Laterality: Right;  Creation Right Arteriovenous Fistula   . [place] REMOVAL  10/04/2011   Procedure: REMOVAL OF ARTERIOVENOUS GORETEX GRAFT ([place]);  Surgeon: Elam Dutch, MD;  Location: [facility];  Service: Vascular;  Laterality: Left;  . CHOLECYSTECTOMY N/A 12/08/2015   Procedure: LAPAROSCOPIC CHOLECYSTECTOMY WITH INTRAOPERATIVE CHOLANGIOGRAM;  Surgeon: Stark Klein, MD;  Location: [place];  Service: General;  Laterality: N/A;  . ESOPHAGOGASTRODUODENOSCOPY N/A 12/24/2012   Procedure: ESOPHAGOGASTRODUODENOSCOPY (EGD);  Surgeon: Milus Banister, MD;  Location: [facility];  Service: Endoscopy;  Laterality: N/A;  . FINGER AMPUTATION Right 02/26/2013   "3rd finger"  . INSERTION OF DIALYSIS CATHETER  10/04/2011   Procedure: INSERTION OF DIALYSIS CATHETER;  Surgeon: Angelia Mould, MD;  Location: [facility];  Service: Vascular;  Laterality: Right;  insertion of dialysis catheter right internal jugular  . INSERTION OF DIALYSIS CATHETER  06/23/2012   Procedure: INSERTION OF DIALYSIS CATHETER;  Surgeon: Angelia Mould, MD;  Location: [facility];  Service: Vascular;  Laterality: N/A;  Ultrasound guided  . KIDNEY TRANSPLANT  01/24/2013  . PATCH ANGIOPLASTY  06/23/2012   Procedure: PATCH ANGIOPLASTY;  Surgeon: Angelia Mould, MD;  Location: [place] [facility];  Service: Vascular;  Laterality: Right;  . PERIPHERAL VASCULAR CATHETERIZATION N/A 09/19/2015   Procedure: Lower Extremity Angiography;  Surgeon: Adrian Prows, MD;  Location: [facility] CV LAB;  Service: Cardiovascular;  Laterality: N/A;  . PERIPHERAL  VASCULAR CATHETERIZATION N/A 09/19/2015   Procedure: Abdominal Aortogram;  Surgeon: Adrian Prows, MD;  Location: [facility] [facility] LAB;  Service: Cardiovascular;  Laterality: N/A;  . PERIPHERAL VASCULAR CATHETERIZATION Right 09/19/2015   Procedure: Peripheral Vascular Intervention;  Surgeon: Adrian Prows, MD;  Location: [place] [facility] LAB;  Service: Cardiovascular;  Laterality: Right;  Right Common  Iliac  . PERIPHERAL VASCULAR CATHETERIZATION N/A 10/06/2015   Procedure: Lower Extremity Angiography;  Surgeon: Adrian Prows, MD;  Location: [place] [facility];  Service: Cardiovascular;  Laterality: N/A;  . PERIPHERAL VASCULAR CATHETERIZATION  10/06/2015   Procedure: Peripheral Vascular Intervention;  Surgeon: Adrian Prows, MD;  Location: [place] [facility] LAB;  Service: Cardiovascular;;  REIA  Omnilink 6.0x29, innova 7x80  RSFA innova 5x80  . REVISON OF ARTERIOVENOUS FISTULA  06/23/2012   Procedure: REVISON OF ARTERIOVENOUS FISTULA;  Surgeon: Angelia Mould, MD;  Location: [facility];  Service: Vascular;  Laterality: Right;  Ultrasound guided  . SHUNTOGRAM N/A 04/06/2012   Procedure: [place] Mal[place];  Surgeon: Angelia Mould, MD;  Location: [facility] CATH LAB;  Service: Cardiovascular;  Laterality: N/A;    There were no vitals filed for this visit.     05/24/16 0810  Symptoms/Limitations  Subjective Still with itching and now dry skin on limb. Also having a lot of pain at tibial crest area. No falls. To clinic today without cane.  Pertinent History IDDM2 with neuropathy & retinopathy, kidney transplant (01/24/2013), respiratory failure 3X (09/27/2011, 10/14/2015 & 12/06/2015) hypoxia, HTN, CHF, PVD, gout (pt denies),   Limitations Lifting;Standing;Walking  Patient Stated Goals She wants to return to "normal" walking, driving, going out in community, working, going concerts, amusement parks.  Pain Assessment  Currently in Pain? Yes  Pain Score 5  Pain Location Leg (bony area of tibia)  Pain Orientation Right  Pain  Descriptors / Indicators Tender  Pain Type Acute pain  Pain Onset 1 to 4 weeks ago  Pain Frequency Constant (with prosthesis wear only)  Aggravating Factors  pressure from prosthesis  Pain Relieving Factors removing prosthesis      05/24/16 0818  Transfers  Transfers Sit to Stand;Stand to Sit  Sit to Stand 5: Supervision;With upper extremity assist;From chair/3-in-1;From bed  Stand to Sit 5: Supervision;With upper extremity assist;To chair/3-in-1;To bed  Ambulation/Gait  Ambulation/Gait Yes  Ambulation/Gait Assistance 5: Supervision  Ambulation/Gait Assistance Details cues for normalized base of support (continues to keep prosthesis abducted out), for equal step length and for equal stance time with gait via improved weight shifting  Ambulation Distance (Feet) 220 Feet (x2 reps no AD, x1 with cane)  Assistive device Prosthesis;None  Gait Pattern Step-through pattern;Antalgic;Decreased stance time - right  Ambulation Surface Level;Indoor  Prosthetics  Prosthetic Care Comments  [place] July Monday 12/11: dicussed having pre tibial pads added to socket for decreased pressure on her limb. Educated pt on use of cut off socks today as well. Use of single ply cut off sock today decreased pain and allowed prosthesis to feel comfortable. Also edcuated on use of Bag Balm for dry skin and to decrease itching. Advised pt to stop scratching with nails as it opens skin and can lead to an infection on limb.  Current prosthetic wear tolerance (days/week)  daily  Current prosthetic wear tolerance (#hours/day)  all awake hours, drying mid-day & prn  Residual limb condition  minor skin abrasions from scratching, some dry areas as well on bottom limb  Education Provided Residual limb care;Correct ply sock adjustment;Proper weight-bearing schedule/adjustment (cut off socks, use of Bag Balm creame, pre tibial pads)  Person(s) Educated Patient  Education Method Explanation;Verbal cues  Education Method  Verbalized understanding;Verbal cues required;Needs further instruction  Donning Prosthesis 6  Doffing Prosthesis 6          PT Short Term Goals - 04/16/16 1610      PT SHORT TERM GOAL #1   Title Patient demonstrates proper donning & verbalizes proper care. (Target Date: 04/19/2016)   Baseline MET 04/16/2016   Time 1   Period Months   Status Achieved     PT SHORT TERM GOAL #2   Title Patient tolerates wear >10hrs total wear/day without skin issues. (Target Date: 04/19/2016)   Baseline MET 04/16/2016   Time 1   Period Months   Status Achieved     PT SHORT TERM GOAL #3   Title Patient ambulates 300' with RW & prosthesis modified independent. (Target Date: 04/19/2016)   Baseline MET 04/16/2016   Time 1   Period Months   Status Achieved     PT SHORT TERM GOAL #4   Title Pateint negotiates ramps, curbs & stairs (2 rails) with RW & prosthesis modified independent. (Target Date: 04/19/2016)   Baseline MET 04/16/2016   Time 1   Period Months   Status Achieved     PT SHORT TERM GOAL #5   Title Patient reaches 7" anteriorly & to floor without balance loss with supervision. (Target Date: 04/19/2016)   Baseline MET 04/16/2016   Time 1   Period Months   Status Achieved           PT Long Term Goals - 05/08/16 0951      PT LONG TERM GOAL #1   Title Patient verbalizes & demonstrates understanding of prosthetic care to enable safe use of prosthesis. (Target Date: 05/17/2016)   Baseline 05/08/2016 Met   Time 2   Period Months   Status Achieved     PT LONG TERM GOAL #2   Title Patient tolerates wear >90% of awake hours without skin integrity issues or limb tenderness to enable function throughout her day. (Target Date: 05/17/2016)   Baseline 05/08/2016 Met   Time 2   Period Months   Status Achieved     PT LONG TERM GOAL #3   Title Berg Balance >/= 45/56 to indicate lower fall risk. (Target Date: 05/17/2016)   Baseline 05/08/2016 Score today 40/56   Time 2   Period Months    Status On-going     PT LONG TERM GOAL #4   Title Patient ambulates 500' with LRAD & prosthesis including grass modified indepedent to enable function in community. (Target Date: 03/17/2016)   Baseline 05/08/2016 Outside ambulation not observed today. Based on previous sessions pt requires supervision with ambulation and cannot ambulate 500' without rest breaks.   Time 2   Period Months   Status On-going     PT LONG TERM GOAL #5   Title Patient negotiates ramps, curbs & stairs (1 rail) modified independent to enable community access. (Target Date: 05/17/2016)   Baseline 05/08/2016 Not observed today. Based on previous sessions patient required supervision.   Time 2   Period Months   Status On-going  05/24/16 0817  Plan  Clinical Impression Statement today's skilled session focused on prosthetic management with goal for decreased limb pain and increased comfort with prosthesis wear. Pt will need continued cues/assitance on sock management. Pt is progessing towards goals and should benefit from continued PT to progress toward unmet goals.                          Pt will benefit from skilled therapeutic intervention in order to improve on the following deficits Abnormal gait;Decreased activity tolerance;Decreased balance;Decreased endurance;Decreased knowledge of use of DME;Decreased mobility;Decreased strength;Postural dysfunction;Prosthetic Dependency;Pain  Rehab Potential Good  PT Frequency 2x / week  PT Duration Other (comment) (9 weeks (60 days))  PT Treatment/Interventions ADLs/Self Care Home Management;Gait training;Stair training;Functional mobility training;Therapeutic activities;Therapeutic exercise;Balance training;Neuromuscular re-education;Patient/family education;Prosthetic Training  PT Next Visit Plan Continue gait without cane including negotiating barriers & and use of cane with outdoors, balance activities in SLS  Consulted and Agree with Plan of Care Patient      Patient will benefit from skilled therapeutic intervention in order to improve the following deficits and impairments:  Abnormal gait, Decreased activity tolerance, Decreased balance, Decreased endurance, Decreased knowledge of use of DME, Decreased mobility, Decreased strength, Postural dysfunction, Prosthetic Dependency, Pain  Visit Diagnosis: Other abnormalities of gait and mobility  Unsteadiness on feet  Muscle weakness (generalized)  Other symptoms and signs involving the musculoskeletal system     Problem List Patient Active Problem List   Diagnosis Date Noted  . Transaminitis 12/06/2015  . Acute on chronic renal failure ([place]) 12/06/2015  . History of renal transplant 12/06/2015  . UTI (lower urinary tract infection) 12/06/2015  . Acute cholecystitis 12/06/2015  . Below knee amputation status ([place]) 11/29/2015  . Asystole ([place])   . S/P unilateral BKA (below knee amputation) ([place])   . Pericardial effusion   . Type 2 diabetes mellitus with peripheral neuropathy ([facility])   . Chronic kidney disease, stage IV (severe) ([place])   . Essential hypertension   . Anemia of chronic disease   . Acute blood loss anemia   . Gangrene associated with diabetes mellitus ([place]) 10/13/2015  . Critical lower limb ischemia 10/05/2015  . PAD (peripheral artery disease) ([place]) 09/18/2015  . Leukopenia 08/22/2014  . Protein-calorie malnutrition, severe ([place]) 12/24/2012  . Gastric ulcer 12/24/2012  . Mechanical complication of other vascular device, implant, and graft 09/22/2012  . End stage renal disease ([place]) 10/23/2011  . Physical deconditioning 10/09/2011  . HTN (hypertension), malignant 09/27/2011  . Chronic kidney disease, stage 4, severely decreased GFR ([facility]) 09/27/2011  . PNA (pneumonia) 09/27/2011  . Acute respiratory failure with hypoxia ([place]) 09/27/2011  . VOMITING 12/06/2009  . GOUT 12/21/2008  . Uncontrolled type 2 diabetes mellitus with peripheral artery disease ([place]) 08/14/2006   . HYPERLIPIDEMIA 08/14/2006  . OBESITY, NOS 08/14/2006  . FIBROADENOSIS, BREAST 08/14/2006  . Irregular menstrual cycle 08/14/2006    Willow Ora, PTA, [facility] [address], [place], [place] [address] [phone] 05/25/16, 9:10 PM   Name: PORSCHE NOGUCHI MRN: [id number] Date of Birth: 1971-02-04

## 2016-05-27 ENCOUNTER — Encounter: Payer: Self-pay | Admitting: Physical Therapy

## 2016-05-28 ENCOUNTER — Ambulatory Visit: Payer: Medicare Other | Admitting: Physical Therapy

## 2016-05-29 ENCOUNTER — Encounter: Payer: Self-pay | Admitting: Physical Therapy

## 2016-05-29 ENCOUNTER — Ambulatory Visit: Payer: Medicare Other | Admitting: Physical Therapy

## 2016-05-29 DIAGNOSIS — R2689 Other abnormalities of gait and mobility: Secondary | ICD-10-CM

## 2016-05-29 DIAGNOSIS — M6281 Muscle weakness (generalized): Secondary | ICD-10-CM

## 2016-05-29 DIAGNOSIS — R29898 Other symptoms and signs involving the musculoskeletal system: Secondary | ICD-10-CM

## 2016-05-29 DIAGNOSIS — R2681 Unsteadiness on feet: Secondary | ICD-10-CM

## 2016-05-30 NOTE — Therapy (Signed)
[facility] [address] [place], [place], [address] Phone: [phone]   Fax:  [phone]  Physical Therapy Treatment  Patient Details  Name: [name] MRN: [id number] Date of Birth: [birth year] Referring Provider: Meridee Score, MD  Encounter Date: 05/29/2016      PT End of Session - 05/29/16 1446    Visit Number 20   Number of Visits 26  Increased due to renewal   Date for PT Re-Evaluation 05/17/16   Authorization Type Medicare G-Code & progress note required   PT Start Time 1445   PT Stop Time 1530   PT Time Calculation (min) 45 min   Equipment Utilized During Treatment Gait belt   Activity Tolerance Patient tolerated treatment well;Patient limited by fatigue;Patient limited by pain   Behavior During Therapy [facility] for tasks assessed/performed      Past Medical History:  Diagnosis Date  . Anemia   . CHF (congestive heart failure) ([place])   . Critical ischemia of lower extremity hospitalized 10/05/2015   right  . Diabetic neuropathy ([place])   . ESRD (end stage renal disease) on dialysis ([place]) 09/28/2011-01/23/2013   M-W-F; [place]  . GERD (gastroesophageal reflux disease)   . High cholesterol   . History of blood transfusion    related to "kidney transplant"  . Hypertension    sees Dr. Carolin Guernsey  . Metabolic bone disease   . Peripheral vascular disease ([place])   . Pneumonia 09/27/2011  . Retinopathy   . Type II diabetes mellitus ([place])    "controlled with diet"    Past Surgical History:  Procedure Laterality Date  . AMPUTATION Right 10/13/2015   Procedure: Right Transmetatarsal Amputation;  Surgeon: Newt Minion, MD;  Location: [facility];  Service: Orthopedics;  Laterality: Right;  . AMPUTATION Right 11/29/2015   Procedure: RIGHT BELOW KNEE AMPUTATION;  Surgeon: Newt Minion, MD;  Location: [facility];  Service: Orthopedics;  Laterality: Right;  . AV FISTULA PLACEMENT  10/04/2011   Procedure: INSERTION OF  ARTERIOVENOUS (AV) GORE-TEX GRAFT ARM;  Surgeon: Angelia Mould, MD;  Location: [place] [facility];  Service: Vascular;  Laterality: Left;  Insertion left upper arm Arteriovenous goretex graft  . AV FISTULA PLACEMENT  10/29/2011   Procedure: ARTERIOVENOUS (AV) FISTULA CREATION;  Surgeon: Angelia Mould, MD;  Location: [facility];  Service: Vascular;  Laterality: Right;  Creation Right Arteriovenous Fistula   . [place] REMOVAL  10/04/2011   Procedure: REMOVAL OF ARTERIOVENOUS GORETEX GRAFT ([place]);  Surgeon: Elam Dutch, MD;  Location: [place];  Service: Vascular;  Laterality: Left;  . CHOLECYSTECTOMY N/A 12/08/2015   Procedure: LAPAROSCOPIC CHOLECYSTECTOMY WITH INTRAOPERATIVE CHOLANGIOGRAM;  Surgeon: Stark Klein, MD;  Location: [place];  Service: General;  Laterality: N/A;  . ESOPHAGOGASTRODUODENOSCOPY N/A 12/24/2012   Procedure: ESOPHAGOGASTRODUODENOSCOPY (EGD);  Surgeon: Milus Banister, MD;  Location: [place];  Service: Endoscopy;  Laterality: N/A;  . FINGER AMPUTATION Right 02/26/2013   "3rd finger"  . INSERTION OF DIALYSIS CATHETER  10/04/2011   Procedure: INSERTION OF DIALYSIS CATHETER;  Surgeon: Angelia Mould, MD;  Location: [place];  Service: Vascular;  Laterality: Right;  insertion of dialysis catheter right internal jugular  . INSERTION OF DIALYSIS CATHETER  06/23/2012   Procedure: INSERTION OF DIALYSIS CATHETER;  Surgeon: Angelia Mould, MD;  Location: [place];  Service: Vascular;  Laterality: N/A;  Ultrasound guided  . KIDNEY TRANSPLANT  01/24/2013  . PATCH ANGIOPLASTY  06/23/2012   Procedure: PATCH ANGIOPLASTY;  Surgeon: Angelia Mould, MD;  Location: [facility];  Service: Vascular;  Laterality: Right;  . PERIPHERAL VASCULAR CATHETERIZATION N/A 09/19/2015   Procedure: Lower Extremity Angiography;  Surgeon: Adrian Prows, MD;  Location: [facility];  Service: Cardiovascular;  Laterality: N/A;  . PERIPHERAL VASCULAR CATHETERIZATION N/A 09/19/2015   Procedure: Abdominal Aortogram;   Surgeon: Adrian Prows, MD;  Location: [place] [facility];  Service: Cardiovascular;  Laterality: N/A;  . PERIPHERAL VASCULAR CATHETERIZATION Right 09/19/2015   Procedure: Peripheral Vascular Intervention;  Surgeon: Adrian Prows, MD;  Location: [place] [facility];  Service: Cardiovascular;  Laterality: Right;  Right Common  Iliac  . PERIPHERAL VASCULAR CATHETERIZATION N/A 10/06/2015   Procedure: Lower Extremity Angiography;  Surgeon: Adrian Prows, MD;  Location: [place] [facility];  Service: Cardiovascular;  Laterality: N/A;  . PERIPHERAL VASCULAR CATHETERIZATION  10/06/2015   Procedure: Peripheral Vascular Intervention;  Surgeon: Adrian Prows, MD;  Location: [place] [facility];  Service: Cardiovascular;;  REIA  Omnilink 6.0x29, innova 7x80  RSFA innova 5x80  . REVISON OF ARTERIOVENOUS FISTULA  06/23/2012   Procedure: REVISON OF ARTERIOVENOUS FISTULA;  Surgeon: Angelia Mould, MD;  Location: [facility];  Service: Vascular;  Laterality: Right;  Ultrasound guided  . SHUNTOGRAM N/A 04/06/2012   Procedure: Earney Mallet;  Surgeon: Angelia Mould, MD;  Location: [facility] CATH LAB;  Service: Cardiovascular;  Laterality: N/A;    There were no vitals filed for this visit.      Subjective Assessment - 05/29/16 1444    Subjective No new complaints. No falls or pain to report. Saw Ronalee Belts Fritz Pickerel was not available) who added pads to socket. Reports the prosthesis feels much better.   Patient is accompained by: Family member   Pertinent History IDDM2 with neuropathy & retinopathy, kidney transplant (01/24/2013), respiratory failure 3X (09/27/2011, 10/14/2015 & 12/06/2015) hypoxia, HTN, CHF, PVD, gout (pt denies),    Limitations Lifting;Standing;Walking   Patient Stated Goals She wants to return to "normal" walking, driving, going out in community, working, going concerts, amusement parks.   Currently in Pain? No/denies   Pain Score 0-No pain            [facility] Adult PT Treatment/Exercise - 05/29/16 1448      Transfers    Transfers Sit to Stand;Stand to Sit   Sit to Stand 5: Supervision;With upper extremity assist;From chair/3-in-1;From bed   Stand to Sit 5: Supervision;With upper extremity assist;To chair/3-in-1;To bed     Ambulation/Gait   Ambulation/Gait Yes   Ambulation/Gait Assistance 5: Supervision   Ambulation/Gait Assistance Details cues to maintain normalized base of support with gait. pt noted to have short prosthesis, measured and found to be ~1/4 inch short on prosthetic side. Pt to call and set appt with prosthetist to have this adjusted.   Ambulation Distance (Feet) 450 Feet  x1   Assistive device Prosthesis;None   Gait Pattern Step-through pattern;Antalgic;Decreased stance time - right   Ambulation Surface Level;Indoor   Stairs Yes   Stairs Assistance 4: Min guard;5: Supervision   Stairs Assistance Details (indicate cue type and reason) demo 1st, then cues on correct technique for descending stairs reciprocally   Stair Management Technique Two rails;Alternating pattern;Forwards   Number of Stairs 4  x 4 reps   Ramp 5: Supervision   Ramp Details (indicate cue type and reason) x 3 reps, occasional cues on step length and weight shifting   Curb 5: Supervision   Curb Details (indicate cue type and reason) x 3 reps, cues  on step/stance position with ascending/descending      High Level Balance   High Level Balance Activities Negotiating over obstacles;Negotitating around obstacles   High Level Balance Comments obstacle course consisting of walking around hoola hoops<>stepping over 4 bolsters of vaired heights with no AD x  laps with min guard assist to min assist for balance     Prosthetics   Current prosthetic wear tolerance (days/week)  daily   Current prosthetic wear tolerance (#hours/day)  all awake hours, drying mid-day & prn   Residual limb condition  intact per pt report, abrasions are cleared. still with dry skin   Education Provided Correct ply sock adjustment   Person(s) Educated  Patient   Education Method Explanation;Verbal cues   Education Method Verbalized understanding;Verbal cues required   Donning Prosthesis Modified independent (device/increased time)   Doffing Prosthesis Modified independent (device/increased time)           Balance Exercises - 05/29/16 1521      Balance Exercises: Standing   Rockerboard Anterior/posterior;Head turns;EC;EO;20 seconds;10 reps     Balance Exercises: Standing   Rebounder Limitations on balance board: EC no head movements, EO head movements up<>down and left<>right             PT Short Term Goals - 04/16/16 1610      PT SHORT TERM GOAL #1   Title Patient demonstrates proper donning & verbalizes proper care. (Target Date: 04/19/2016)   Baseline MET 04/16/2016   Time 1   Period Months   Status Achieved     PT SHORT TERM GOAL #2   Title Patient tolerates wear >10hrs total wear/day without skin issues. (Target Date: 04/19/2016)   Baseline MET 04/16/2016   Time 1   Period Months   Status Achieved     PT SHORT TERM GOAL #3   Title Patient ambulates 300' with RW & prosthesis modified independent. (Target Date: 04/19/2016)   Baseline MET 04/16/2016   Time 1   Period Months   Status Achieved     PT SHORT TERM GOAL #4   Title Pateint negotiates ramps, curbs & stairs (2 rails) with RW & prosthesis modified independent. (Target Date: 04/19/2016)   Baseline MET 04/16/2016   Time 1   Period Months   Status Achieved     PT SHORT TERM GOAL #5   Title Patient reaches 7" anteriorly & to floor without balance loss with supervision. (Target Date: 04/19/2016)   Baseline MET 04/16/2016   Time 1   Period Months   Status Achieved           PT Long Term Goals - 05/08/16 0951      PT LONG TERM GOAL #1   Title Patient verbalizes & demonstrates understanding of prosthetic care to enable safe use of prosthesis. (Target Date: 05/17/2016)   Baseline 05/08/2016 Met   Time 2   Period Months   Status Achieved     PT  LONG TERM GOAL #2   Title Patient tolerates wear >90% of awake hours without skin integrity issues or limb tenderness to enable function throughout her day. (Target Date: 05/17/2016)   Baseline 05/08/2016 Met   Time 2   Period Months   Status Achieved     PT LONG TERM GOAL #3   Title Berg Balance >/= 45/56 to indicate lower fall risk. (Target Date: 05/17/2016)   Baseline 05/08/2016 Score today 40/56   Time 2   Period Months   Status On-going     PT  LONG TERM GOAL #4   Title Patient ambulates 500' with LRAD & prosthesis including grass modified indepedent to enable function in community. (Target Date: 03/17/2016)   Baseline 05/08/2016 Outside ambulation not observed today. Based on previous sessions pt requires supervision with ambulation and cannot ambulate 500' without rest breaks.   Time 2   Period Months   Status On-going     PT LONG TERM GOAL #5   Title Patient negotiates ramps, curbs & stairs (1 rail) modified independent to enable community access. (Target Date: 05/17/2016)   Baseline 05/08/2016 Not observed today. Based on previous sessions patient required supervision.   Time 2   Period Months   Status On-going            Plan - May 30, 2016 1446    Clinical Impression Statement Today's skilled session continued to focus on prosthetic use with mobility and balance. Pt is making steady progress toward goals and should benefit from continued PT to progress toward goals not met.    Rehab Potential Good   PT Frequency 2x / week   PT Duration Other (comment)  9 weeks (60 days)   PT Treatment/Interventions ADLs/Self Care Home Management;Gait training;Stair training;Functional mobility training;Therapeutic activities;Therapeutic exercise;Balance training;Neuromuscular re-education;Patient/family education;Prosthetic Training   PT Next Visit Plan Continue gait without cane including negotiating barriers & and use of cane with outdoors, balance activities in SLS   Consulted and Agree  with Plan of Care Patient      Patient will benefit from skilled therapeutic intervention in order to improve the following deficits and impairments:  Abnormal gait, Decreased activity tolerance, Decreased balance, Decreased endurance, Decreased knowledge of use of DME, Decreased mobility, Decreased strength, Postural dysfunction, Prosthetic Dependency, Pain  Visit Diagnosis: Other abnormalities of gait and mobility  Unsteadiness on feet  Muscle weakness (generalized)  Other symptoms and signs involving the musculoskeletal system       G-Codes - May 30, 2016 1446    Functional Assessment Tool Used Pt is Independent with prosthetic care, including donning/doffing. Wearing all awake hours for 7 days a week. Occasional cues needed on sock ply management and new concepts for general prosthetic mangement.    Functional Limitation Self care      Problem List Patient Active Problem List   Diagnosis Date Noted  . Transaminitis 12/06/2015  . Acute on chronic renal failure ([place]) 12/06/2015  . History of renal transplant 12/06/2015  . UTI (lower urinary tract infection) 12/06/2015  . Acute cholecystitis 12/06/2015  . Below knee amputation status ([place]) 11/29/2015  . Asystole ([place])   . S/P unilateral BKA (below knee amputation) ([place])   . Pericardial effusion   . Type 2 diabetes mellitus with peripheral neuropathy ([facility])   . Chronic kidney disease, stage IV (severe) ([place])   . Essential hypertension   . Anemia of chronic disease   . Acute blood loss anemia   . Gangrene associated with diabetes mellitus ([place]) 10/13/2015  . Critical lower limb ischemia 10/05/2015  . PAD (peripheral artery disease) ([place]) 09/18/2015  . Leukopenia 08/22/2014  . Protein-calorie malnutrition, severe ([place]) 12/24/2012  . Gastric ulcer 12/24/2012  . Mechanical complication of other vascular device, implant, and graft 09/22/2012  . End stage renal disease ([place]) 10/23/2011  . Physical deconditioning 10/09/2011  .  HTN (hypertension), malignant 09/27/2011  . Chronic kidney disease, stage 4, severely decreased GFR ([facility]) 09/27/2011  . PNA (pneumonia) 09/27/2011  . Acute respiratory failure with hypoxia ([place]) 09/27/2011  . VOMITING 12/06/2009  . GOUT 12/21/2008  .  Uncontrolled type 2 diabetes mellitus with peripheral artery disease ([place]) 08/14/2006  . HYPERLIPIDEMIA 08/14/2006  . OBESITY, NOS 08/14/2006  . FIBROADENOSIS, BREAST 08/14/2006  . Irregular menstrual cycle 08/14/2006    Willow Ora, PTA, [facility] [address] [place], [place] [address] [phone] 05/30/16, 11:28 AM   Name: NDIA SAMPATH MRN: [id number] Date of Birth: 1970-09-10     06/16/2016 1446  PT G-Codes  Functional Assessment Tool Used Pt is Independent with prosthetic care, including donning/doffing. Wearing all awake hours for 7 days a week. Occasional cues needed on sock ply management and new concepts for general prosthetic mangement.   Functional Limitation Self care  Self Care Current Status [phone]) CI  Self Care Goal Status ([id number]) CI   Jamey Reas, PT, DPT PT Specializing in [place] 05/30/16 11:46 PM Phone:  [phone]  Fax:  [phone] [facility] [address] [place], [place] [address]

## 2016-06-04 ENCOUNTER — Ambulatory Visit: Payer: Medicare Other | Admitting: Physical Therapy

## 2016-06-04 ENCOUNTER — Encounter: Payer: Self-pay | Admitting: Physical Therapy

## 2016-06-04 DIAGNOSIS — R2689 Other abnormalities of gait and mobility: Secondary | ICD-10-CM | POA: Diagnosis not present

## 2016-06-04 DIAGNOSIS — M6281 Muscle weakness (generalized): Secondary | ICD-10-CM

## 2016-06-04 DIAGNOSIS — R29898 Other symptoms and signs involving the musculoskeletal system: Secondary | ICD-10-CM | POA: Diagnosis not present

## 2016-06-04 DIAGNOSIS — R2681 Unsteadiness on feet: Secondary | ICD-10-CM

## 2016-06-05 ENCOUNTER — Ambulatory Visit: Payer: Medicare Other | Admitting: Physical Therapy

## 2016-06-05 NOTE — Addendum Note (Signed)
Addended by: Isaias Cowman on: 06/05/2016 08:36 AM   Modules accepted: Orders

## 2016-06-05 NOTE — Therapy (Signed)
[facility] [address] [place], [place], [address] Phone: [phone]   Fax:  [phone]  Physical Therapy Treatment  Patient Details  Name: [name] MRN: [id number] Date of Birth: 07/02/1970 Referring Provider: Meridee Score, MD  Encounter Date: 06/04/2016      PT End of Session - 06/04/16 1205    Visit Number 21   Number of Visits 26  Increased due to renewal   Date for PT Re-Evaluation 05/17/16   Authorization Type Medicare G-Code & progress note required   PT Start Time 0801   PT Stop Time 0845   PT Time Calculation (min) 44 min   Equipment Utilized During Treatment Gait belt   Activity Tolerance Patient tolerated treatment well   Behavior During Therapy [facility] for tasks assessed/performed      Past Medical History:  Diagnosis Date  . Anemia   . CHF (congestive heart failure) ([place])   . Critical ischemia of lower extremity hospitalized 10/05/2015   right  . Diabetic neuropathy ([place])   . ESRD (end stage renal disease) on dialysis ([place]) 09/28/2011-01/23/2013   M-W-F; [place]  . GERD (gastroesophageal reflux disease)   . High cholesterol   . History of blood transfusion    related to "kidney transplant"  . Hypertension    sees Dr. Carolin Guernsey  . Metabolic bone disease   . Peripheral vascular disease ([place])   . Pneumonia 09/27/2011  . Retinopathy   . Type II diabetes mellitus ([place])    "controlled with diet"    Past Surgical History:  Procedure Laterality Date  . AMPUTATION Right 10/13/2015   Procedure: Right Transmetatarsal Amputation;  Surgeon: Newt Minion, MD;  Location: [place];  Service: Orthopedics;  Laterality: Right;  . AMPUTATION Right 11/29/2015   Procedure: RIGHT BELOW KNEE AMPUTATION;  Surgeon: Newt Minion, MD;  Location: [place];  Service: Orthopedics;  Laterality: Right;  . AV FISTULA PLACEMENT  10/04/2011   Procedure: INSERTION OF ARTERIOVENOUS (AV) GORE-TEX GRAFT ARM;  Surgeon:  Angelia Mould, MD;  Location: [place];  Service: Vascular;  Laterality: Left;  Insertion left upper arm Arteriovenous goretex graft  . AV FISTULA PLACEMENT  10/29/2011   Procedure: ARTERIOVENOUS (AV) FISTULA CREATION;  Surgeon: Angelia Mould, MD;  Location: [facility] OR;  Service: Vascular;  Laterality: Right;  Creation Right Arteriovenous Fistula   . [place] REMOVAL  10/04/2011   Procedure: REMOVAL OF ARTERIOVENOUS GORETEX GRAFT ([place]);  Surgeon: Elam Dutch, MD;  Location: [place];  Service: Vascular;  Laterality: Left;  . CHOLECYSTECTOMY N/A 12/08/2015   Procedure: LAPAROSCOPIC CHOLECYSTECTOMY WITH INTRAOPERATIVE CHOLANGIOGRAM;  Surgeon: Stark Klein, MD;  Location: [place];  Service: General;  Laterality: N/A;  . ESOPHAGOGASTRODUODENOSCOPY N/A 12/24/2012   Procedure: ESOPHAGOGASTRODUODENOSCOPY (EGD);  Surgeon: Milus Banister, MD;  Location: [place];  Service: Endoscopy;  Laterality: N/A;  . FINGER AMPUTATION Right 02/26/2013   "3rd finger"  . INSERTION OF DIALYSIS CATHETER  10/04/2011   Procedure: INSERTION OF DIALYSIS CATHETER;  Surgeon: Angelia Mould, MD;  Location: [place];  Service: Vascular;  Laterality: Right;  insertion of dialysis catheter right internal jugular  . INSERTION OF DIALYSIS CATHETER  06/23/2012   Procedure: INSERTION OF DIALYSIS CATHETER;  Surgeon: Angelia Mould, MD;  Location: [place];  Service: Vascular;  Laterality: N/A;  Ultrasound guided  . KIDNEY TRANSPLANT  01/24/2013  . PATCH ANGIOPLASTY  06/23/2012   Procedure: PATCH ANGIOPLASTY;  Surgeon: Angelia Mould, MD;  Location: [facility] OR;  Service: Vascular;  Laterality: Right;  . PERIPHERAL VASCULAR CATHETERIZATION N/A 09/19/2015   Procedure: Lower Extremity Angiography;  Surgeon: Adrian Prows, MD;  Location: [place] CV LAB;  Service: Cardiovascular;  Laterality: N/A;  . PERIPHERAL VASCULAR CATHETERIZATION N/A 09/19/2015   Procedure: Abdominal Aortogram;  Surgeon: Adrian Prows, MD;  Location: [place] CV  LAB;  Service: Cardiovascular;  Laterality: N/A;  . PERIPHERAL VASCULAR CATHETERIZATION Right 09/19/2015   Procedure: Peripheral Vascular Intervention;  Surgeon: Adrian Prows, MD;  Location: [place] CV LAB;  Service: Cardiovascular;  Laterality: Right;  Right Common  Iliac  . PERIPHERAL VASCULAR CATHETERIZATION N/A 10/06/2015   Procedure: Lower Extremity Angiography;  Surgeon: Adrian Prows, MD;  Location: [place] CV LAB;  Service: Cardiovascular;  Laterality: N/A;  . PERIPHERAL VASCULAR CATHETERIZATION  10/06/2015   Procedure: Peripheral Vascular Intervention;  Surgeon: Adrian Prows, MD;  Location: [place] CV LAB;  Service: Cardiovascular;;  REIA  Omnilink 6.0x29, innova 7x80  RSFA innova 5x80  . REVISON OF ARTERIOVENOUS FISTULA  06/23/2012   Procedure: REVISON OF ARTERIOVENOUS FISTULA;  Surgeon: Angelia Mould, MD;  Location: [place];  Service: Vascular;  Laterality: Right;  Ultrasound guided  . SHUNTOGRAM N/A 04/06/2012   Procedure: Earney Mallet;  Surgeon: Angelia Mould, MD;  Location: [facility] CATH LAB;  Service: Cardiovascular;  Laterality: N/A;    There were no vitals filed for this visit.      Subjective Assessment - 06/04/16 0805    Subjective She went to [place] and walked with cane without issues. She is wearing prosthesis most of awake hours.    Pertinent History IDDM2 with neuropathy & retinopathy, kidney transplant (01/24/2013), respiratory failure 3X (09/27/2011, 10/14/2015 & 12/06/2015) hypoxia, HTN, CHF, PVD, gout (pt denies),    Limitations Lifting;Standing;Walking   Patient Stated Goals She wants to return to "normal" walking, driving, going out in community, working, going concerts, amusement parks.   Currently in Pain? No/denies            [facility] PT Assessment - 06/04/16 0800      Observation/Other Assessments   Focus on Therapeutic Outcomes (FOTO)  67.15 Functional Status  Initial was 42.44 FS   Activities of Balance Confidence Scale (ABC Scale)  85.6%  Initial  was 23.1%   Fear Avoidance Belief Questionnaire (FABQ)  19 (5)  Initial was 26 (8)     Transfers   Transfers Sit to Stand;Stand to Sit   Sit to Stand 7: Independent;Without upper extremity assist;From chair/3-in-1   Stand to Sit 7: Independent;Without upper extremity assist;To chair/3-in-1     Ambulation/Gait   Ambulation/Gait Yes   Ambulation/Gait Assistance 7: Independent   Ambulation/Gait Assistance Details uses cane for distances >400'   Ambulation Distance (Feet) 500 Feet   Assistive device Prosthesis;None   Gait Pattern Step-through pattern;Decreased arm swing - right;Decreased stance time - right   Ambulation Surface Indoor;Level;Outdoor;Gravel;Grass;Paved   Gait velocity 3.33 ft/sec comfortable & 3.79 ft/sec fast  prosthesis only   Stairs Yes   Stairs Assistance 6: Modified independent (Device/Increase time)   Stair Management Technique One rail Right;Alternating pattern;Forwards   Number of Stairs 4  2 reps   Ramp 6: Modified independent (Device)  prosthesis only   Curb 6: Modified independent (Device/increase time)  prosthesis only     Standardized Balance Assessment   Standardized Balance Assessment Dynamic Gait Index     Berg Balance Test   Sit to Stand Able to stand without using hands and stabilize independently  Standing Unsupported Able to stand safely 2 minutes   Sitting with Back Unsupported but Feet Supported on Floor or Stool Able to sit safely and securely 2 minutes   Stand to Sit Sits safely with minimal use of hands   Transfers Able to transfer safely, minor use of hands   Standing Unsupported with Eyes Closed Able to stand 10 seconds safely   Standing Ubsupported with Feet Together Able to place feet together independently and stand 1 minute safely   From Standing, Reach Forward with Outstretched Arm Can reach confidently >25 cm (10")   From Standing Position, Pick up Object from Floor Able to pick up shoe safely and easily   From Standing Position,  Turn to Look Behind Over each Shoulder Looks behind from both sides and weight shifts well   Turn 360 Degrees Able to turn 360 degrees safely in 4 seconds or less   Standing Unsupported, Alternately Place Feet on Step/Stool Able to stand independently and complete 8 steps >20 seconds   Standing Unsupported, One Foot in Front Able to plae foot ahead of the other independently and hold 30 seconds   Standing on One Leg Able to lift leg independently and hold equal to or more than 3 seconds   Total Score 52   Berg comment: Initial 19/56     Dynamic Gait Index   Level Surface Normal   Change in Gait Speed Normal   Gait with Horizontal Head Turns Mild Impairment   Gait with Vertical Head Turns Mild Impairment   Gait and Pivot Turn Normal   Step Over Obstacle Mild Impairment   Step Around Obstacles Normal   Steps Mild Impairment   Total Score 20         Prosthetics Assessment - 06/04/16 0800      Prosthetics   Prosthetic Care Independent with Skin check;Residual limb care;Care of non-amputated limb;Prosthetic cleaning;Ply sock cleaning;Correct ply sock adjustment;Proper wear schedule/adjustment;Proper weight-bearing schedule/adjustment   Prosthetic Care Comments  Follow-up with prosthetist, Amputee Support Group   Donning prosthesis  Independent   Doffing prosthesis  Independent   Current prosthetic wear tolerance (days/week)  daily   Current prosthetic wear tolerance (#hours/day)  all awake hours, drying mid-day & prn   Current prosthetic weight-bearing tolerance (hours/day)  tolerates standing 30 min without pain or issues   Edema none   Residual limb condition  intact   K code/activity level with prosthetic use  K3 Full community with variable cadence                              PT Short Term Goals - 04/16/16 1610      PT SHORT TERM GOAL #1   Title Patient demonstrates proper donning & verbalizes proper care. (Target Date: 04/19/2016)   Baseline MET  04/16/2016   Time 1   Period Months   Status Achieved     PT SHORT TERM GOAL #2   Title Patient tolerates wear >10hrs total wear/day without skin issues. (Target Date: 04/19/2016)   Baseline MET 04/16/2016   Time 1   Period Months   Status Achieved     PT SHORT TERM GOAL #3   Title Patient ambulates 300' with RW & prosthesis modified independent. (Target Date: 04/19/2016)   Baseline MET 04/16/2016   Time 1   Period Months   Status Achieved     PT SHORT TERM GOAL #4   Title Pateint negotiates ramps, curbs &  stairs (2 rails) with RW & prosthesis modified independent. (Target Date: 04/19/2016)   Baseline MET 04/16/2016   Time 1   Period Months   Status Achieved     PT SHORT TERM GOAL #5   Title Patient reaches 7" anteriorly & to floor without balance loss with supervision. (Target Date: 04/19/2016)   Baseline MET 04/16/2016   Time 1   Period Months   Status Achieved           PT Long Term Goals - 06/09/16 0856      PT LONG TERM GOAL #1   Title Patient verbalizes & demonstrates understanding of prosthetic care to enable safe use of prosthesis. (Updated Target Date: 06/14/2016)   Baseline MET 2016-06-09   Status Achieved     PT LONG TERM GOAL #2   Title Patient tolerates wear >90% of awake hours without skin integrity issues or limb tenderness to enable function throughout her day. (Updated Target Date: 06/14/2016)   Baseline MET June 09, 2016   Status Achieved     PT LONG TERM GOAL #3   Title Berg Balance >/= 45/56 to indicate lower fall risk. (Updated Target Date: 06/14/2016)   Baseline MET 2016-06-09  Merrilee Jansky Balance 52/56   Status Achieved     PT LONG TERM GOAL #4   Title Patient ambulates 500' with LRAD & prosthesis including grass modified indepedent to enable function in community. (Updated Target Date: 12/292017)   Baseline MET 09-Jun-2016 with prosthesis only   Status Achieved     PT LONG TERM GOAL #5   Title Patient negotiates ramps, curbs & stairs (1 rail)  modified independent to enable community access. (Updated Target Date: 06/14/2016)   Baseline MET June 09, 2016   Status Achieved               Plan - 2016-06-09 1205    Clinical Impression Statement Patient met all LTGs. She appears to be safely functioning with prosthesis at full community with variable cadence. She uses a cane for longer distances (>400') otherwise appears safe with prosthesis only.    Rehab Potential Good   PT Frequency 2x / week   PT Duration Other (comment)  9 weeks (60 days)   PT Treatment/Interventions ADLs/Self Care Home Management;Gait training;Stair training;Functional mobility training;Therapeutic activities;Therapeutic exercise;Balance training;Neuromuscular re-education;Patient/family education;Prosthetic Training   PT Next Visit Plan discharge PT   Consulted and Agree with Plan of Care Patient      Patient will benefit from skilled therapeutic intervention in order to improve the following deficits and impairments:  Abnormal gait, Decreased activity tolerance, Decreased balance, Decreased endurance, Decreased knowledge of use of DME, Decreased mobility, Decreased strength, Postural dysfunction, Prosthetic Dependency, Pain  Visit Diagnosis: Other abnormalities of gait and mobility  Unsteadiness on feet  Muscle weakness (generalized)  Other symptoms and signs involving the musculoskeletal system       G-Codes - 06/09/2016 08/19/1205    Functional Assessment Tool Used Pt is independent in prosthetic care & tolerating wear all awake hours daily without issues or pain.    Functional Limitation Self care   Self Care Goal Status [phone]) At least 1 percent but less than 20 percent impaired, limited or restricted   Self Care Discharge Status [phone]) At least 1 percent but less than 20 percent impaired, limited or restricted      Problem List Patient Active Problem List   Diagnosis Date Noted  . Transaminitis 12/06/2015  . Acute on chronic renal failure  ([place]) 12/06/2015  . History of  renal transplant 12/06/2015  . UTI (lower urinary tract infection) 12/06/2015  . Acute cholecystitis 12/06/2015  . Below knee amputation status ([place]) 11/29/2015  . Asystole ([place])   . S/P unilateral BKA (below knee amputation) ([place])   . Pericardial effusion   . Type 2 diabetes mellitus with peripheral neuropathy ([facility])   . Chronic kidney disease, stage IV (severe) ([place])   . Essential hypertension   . Anemia of chronic disease   . Acute blood loss anemia   . Gangrene associated with diabetes mellitus ([place]) 10/13/2015  . Critical lower limb ischemia 10/05/2015  . PAD (peripheral artery disease) ([place]) 09/18/2015  . Leukopenia 08/22/2014  . Protein-calorie malnutrition, severe ([place]) 12/24/2012  . Gastric ulcer 12/24/2012  . Mechanical complication of other vascular device, implant, and graft 09/22/2012  . End stage renal disease ([place]) 10/23/2011  . Physical deconditioning 10/09/2011  . HTN (hypertension), malignant 09/27/2011  . Chronic kidney disease, stage 4, severely decreased GFR ([facility]) 09/27/2011  . PNA (pneumonia) 09/27/2011  . Acute respiratory failure with hypoxia ([place]) 09/27/2011  . VOMITING 12/06/2009  . GOUT 12/21/2008  . Uncontrolled type 2 diabetes mellitus with peripheral artery disease ([place]) 08/14/2006  . HYPERLIPIDEMIA 08/14/2006  . OBESITY, NOS 08/14/2006  . FIBROADENOSIS, BREAST 08/14/2006  . Irregular menstrual cycle 08/14/2006   PHYSICAL THERAPY DISCHARGE SUMMARY  Visits from Start of Care: 21  Current functional level related to goals / functional outcomes: See above   Remaining deficits: See above   Education / Equipment: Prosthetic care & use  Plan: Patient agrees to discharge.  Patient goals were met. Patient is being discharged due to meeting the stated rehab goals.  ?????         Trenell Moxey PT, DPT 06/05/2016, 8:59 AM  [facility] [address] [place], [place], [address] Phone: [phone]   Fax:  [phone]  Name: [name] MRN: [id number] Date of Birth: [birth year]

## 2016-06-06 ENCOUNTER — Ambulatory Visit
Admission: RE | Admit: 2016-06-06 | Discharge: 2016-06-06 | Disposition: A | Discharge status: Home / Self Care | Payer: Medicare Other | Source: Ambulatory Visit | Attending: Internal Medicine | Admitting: Internal Medicine

## 2016-06-06 DIAGNOSIS — Z1231 Encounter for screening mammogram for malignant neoplasm of breast: Secondary | ICD-10-CM | POA: Diagnosis not present

## 2016-06-07 ENCOUNTER — Other Ambulatory Visit: Payer: Self-pay

## 2016-06-07 NOTE — Patient Outreach (Signed)
-  [name] [name]) Care Management  06/07/2016  [name] [name] Jun 10, 1971 [id number]  Telephonic Screening  Referral Date:  05/08/2016 Source:  [place] Issue:  DM, HTN, Renal Failure, Irregular Heart.  Falls: yes due to learning to walk with new prosthesis. Admissions:  2 ED: 0 PCP:  Dr. Benito Mccreedy - last appt 05/03/16 [facility] Provider:  H/o past services with [facility]   MR Review: H/o ESRD s/p renal transplant in 2014, IDDM, PVD, BKA on 11/29/2015, anemia secondary to CKD, HTN, GERD  [name] [id number] [phone] (M) [phone] (H)  Case closed; unable to engage with 3 outreach calls and unsuccessful outreach letter.  [place] notified PCP notified  [name], MSHL, BSN, RN, [facility] Care Management Care Management Coordinator [phone] Direct [phone] Cell [phone] Office [phone] Fax [name]

## 2016-06-13 ENCOUNTER — Encounter: Payer: Self-pay | Admitting: Physical Therapy

## 2016-06-28 ENCOUNTER — Encounter (INDEPENDENT_AMBULATORY_CARE_PROVIDER_SITE_OTHER): Payer: Medicare Other

## 2016-06-28 ENCOUNTER — Ambulatory Visit (INDEPENDENT_AMBULATORY_CARE_PROVIDER_SITE_OTHER): Payer: Medicare Other | Admitting: Vascular Surgery

## 2016-08-05 ENCOUNTER — Other Ambulatory Visit (INDEPENDENT_AMBULATORY_CARE_PROVIDER_SITE_OTHER): Payer: Self-pay | Admitting: Vascular Surgery

## 2016-08-05 DIAGNOSIS — Z940 Kidney transplant status: Secondary | ICD-10-CM

## 2016-08-05 DIAGNOSIS — T829XXS Unspecified complication of cardiac and vascular prosthetic device, implant and graft, sequela: Secondary | ICD-10-CM

## 2016-08-06 ENCOUNTER — Encounter (INDEPENDENT_AMBULATORY_CARE_PROVIDER_SITE_OTHER): Payer: Self-pay | Admitting: Vascular Surgery

## 2016-08-06 ENCOUNTER — Ambulatory Visit (INDEPENDENT_AMBULATORY_CARE_PROVIDER_SITE_OTHER): Payer: Medicare Other | Admitting: Vascular Surgery

## 2016-08-06 ENCOUNTER — Encounter (INDEPENDENT_AMBULATORY_CARE_PROVIDER_SITE_OTHER): Payer: Self-pay

## 2016-08-06 ENCOUNTER — Other Ambulatory Visit (INDEPENDENT_AMBULATORY_CARE_PROVIDER_SITE_OTHER): Payer: Self-pay | Admitting: Vascular Surgery

## 2016-08-06 ENCOUNTER — Ambulatory Visit (INDEPENDENT_AMBULATORY_CARE_PROVIDER_SITE_OTHER): Payer: Medicare Other

## 2016-08-06 ENCOUNTER — Other Ambulatory Visit (INDEPENDENT_AMBULATORY_CARE_PROVIDER_SITE_OTHER): Payer: Self-pay

## 2016-08-06 VITALS — BP 160/76 | HR 85 | Resp 16 | Ht 60.0 in | Wt 138.0 lb

## 2016-08-06 DIAGNOSIS — I10 Essential (primary) hypertension: Secondary | ICD-10-CM

## 2016-08-06 DIAGNOSIS — E1142 Type 2 diabetes mellitus with diabetic polyneuropathy: Secondary | ICD-10-CM | POA: Diagnosis not present

## 2016-08-06 DIAGNOSIS — I739 Peripheral vascular disease, unspecified: Secondary | ICD-10-CM

## 2016-08-06 DIAGNOSIS — Z89519 Acquired absence of unspecified leg below knee: Secondary | ICD-10-CM | POA: Diagnosis not present

## 2016-08-06 DIAGNOSIS — T829XXS Unspecified complication of cardiac and vascular prosthetic device, implant and graft, sequela: Secondary | ICD-10-CM

## 2016-08-06 DIAGNOSIS — Z940 Kidney transplant status: Secondary | ICD-10-CM

## 2016-08-06 DIAGNOSIS — N186 End stage renal disease: Secondary | ICD-10-CM

## 2016-08-06 LAB — VAS US LOWER EXTREMITY ARTERIAL DUPLEX
LEFT PERO DIST SYS: 25 cm/s
Left ant tibial distal sys: -46 cm/s
Left super femoral dist sys PSV: -25 cm/s
Left super femoral mid sys PSV: 27 cm/s
Left super femoral prox sys PSV: 109 cm/s
Right super femoral dist sys PSV: -70 cm/s
Right super femoral prox sys PSV: 93 cm/s

## 2016-08-06 NOTE — Assessment & Plan Note (Signed)
Currently working well.

## 2016-08-06 NOTE — Assessment & Plan Note (Signed)
Her noninvasive studies today show normal flow throughout the right lower extremity to the popliteal artery which is biphasic. The right SFA stent is patent. On the left, she has markedly elevated velocities in her proximal superficial femoral artery consistent with a high-grade stenosis with poor, monophasic flow distally. Given these findings as well as her symptoms and already being status post right below-knee dictation, the patient desires to proceed with left lower extremity revascularization at this time. That is certainly reasonable. I discussed the natural history and pathophysiology of peripheral arterial disease. I discussed the risks and benefits of angiography. She is agreeable to proceed and this will be scheduled in the near future at her convenience.

## 2016-08-06 NOTE — Assessment & Plan Note (Signed)
On the right. Well-healed. Increases her concern over her left lower extremity ischemic symptoms currently has an aunt dictation on that side would be disabling.

## 2016-08-06 NOTE — Assessment & Plan Note (Signed)
Status post renal transplant 

## 2016-08-06 NOTE — Assessment & Plan Note (Signed)
blood pressure control important in reducing the progression of atherosclerotic disease. On appropriate oral medications.  

## 2016-08-06 NOTE — Progress Notes (Signed)
MRN : [id number]  [name] is a 46 y.o. (1971-04-15) female who presents with chief complaint of  Chief Complaint  Patient presents with  . Re-evaluation    6 month ultrasound follow up  .  History of Present Illness: Patient returns today in follow up of Multiple vascular issues. She has lost the third finger on her right hand from ischemia. We studied her with noninvasive studies today and her AV fistula is patent with good flow. In addition, her DRIL bypass is also patent with mildly elevated velocities at the distal anastomosis but this does not appear particularly high-grade. She does not have any current ulceration or infection in her hand. She is status post kidney transplant and is not needing her fistula currently. We also studied her for her peripheral arterial disease. She has already lost her right leg secondary to poor blood flow after an attempted intervention by a cardiologist in [place]. She has some phantom pain in her right leg as well as some claudication symptoms bilaterally. She does not have any current ulceration or infection in either lower extremity. Her noninvasive studies today show normal flow throughout the right lower extremity to the popliteal artery which is biphasic. The right SFA stent is patent. On the left, she has markedly elevated velocities in her proximal superficial femoral artery consistent with a high-grade stenosis with poor, monophasic flow distally.  Current Outpatient Prescriptions  Medication Sig Dispense Refill  . acetaminophen (TYLENOL) 325 MG tablet Take 2 tablets (650 mg total) by mouth every 6 (six) hours as needed for mild pain (or Fever >/= 101). 30 tablet 0  . albuterol (PROVENTIL HFA;VENTOLIN HFA) 108 (90 BASE) MCG/ACT inhaler Inhale 1 puff into the lungs every 6 (six) hours as needed for wheezing or shortness of breath.    Marland Kitchen amLODipine (NORVASC) 10 MG tablet Take 10 mg by mouth at bedtime.     Marland Kitchen aspirin 81 MG EC tablet Take 1  tablet (81 mg total) by mouth daily.    . cilostazol (PLETAL) 100 MG tablet Take 100 mg by mouth 2 (two) times daily.    . cloNIDine (CATAPRES - DOSED IN MG/24 HR) 0.2 mg/24hr patch Place 0.2 mg onto the skin every Friday.    . feeding supplement, ENSURE ENLIVE, (ENSURE ENLIVE) LIQD Take 237 mLs by mouth 2 (two) times daily between meals. 237 mL 12  . ferrous sulfate 325 (65 FE) MG tablet Take 1 tablet (325 mg total) by mouth 3 (three) times daily with meals. 90 tablet 3  . gabapentin (NEURONTIN) 300 MG capsule     . insulin aspart (NOVOLOG) 100 UNIT/ML injection Inject 4-14 Units into the skin 3 (three) times daily before meals. Per sliding scale: 100-150, 4 units. 151-200, 6 units. 201-250, 8 units. 251-300, 10 units. 301-350, 12 units. Over 350, 14 units.    . insulin detemir (LEVEMIR) 100 UNIT/ML injection Inject 0.06 mLs (6 Units total) into the skin at bedtime. 10 mL 0  . labetalol (NORMODYNE) 300 MG tablet Take 300 mg by mouth 2 (two) times daily.  3  . lactobacillus acidophilus (BACID) TABS tablet Take 2 tablets by mouth 3 (three) times daily. 15 tablet 0  . methocarbamol (ROBAXIN) 500 MG tablet Take 1 tablet (500 mg total) by mouth every 8 (eight) hours as needed for muscle spasms. 30 tablet 0  . mycophenolate (MYFORTIC) 180 MG EC tablet Take 360-540 mg by mouth 2 (two) times daily. Take three capsules (540mg ) in the morning  and two capsules (360mg ) at night.    . nitroGLYCERIN (NITRODUR - DOSED IN MG/24 HR) 0.2 mg/hr patch Place 1 patch (0.2 mg total) onto the skin daily. 30 patch 12  . oxyCODONE-acetaminophen (ROXICET) 5-325 MG tablet Take 1 tablet by mouth every 4 (four) hours as needed for severe pain. 60 tablet 0  . oxyCODONE-acetaminophen (ROXICET) 5-325 MG tablet Take 1 tablet by mouth every 4 (four) hours as needed for severe pain. 60 tablet 0  . pantoprazole (PROTONIX) 40 MG tablet Take 40 mg by mouth at bedtime.     . polyethylene glycol (MIRALAX / GLYCOLAX) packet Take 17 g by  mouth daily as needed. 14 each 0  . pravastatin (PRAVACHOL) 20 MG tablet Take 20 mg by mouth at bedtime.     . senna-docusate (SENOKOT-S) 8.6-50 MG tablet Take 1 tablet by mouth 2 (two) times daily. 30 tablet 0  . sulfamethoxazole-trimethoprim (BACTRIM,SEPTRA) 400-80 MG per tablet Take 1 tablet by mouth every Monday, Wednesday, and Friday.     . tacrolimus (PROGRAF) 0.5 MG capsule Take 0.5 mg by mouth 2 (two) times daily. Take with 1mg  capsule to make a total of 1.5mg .    . tacrolimus (PROGRAF) 1 MG capsule Take 1 mg by mouth 2 (two) times daily. Take with 0.5mg  capsule to make a total of 1.5mg .    . Vitamin D, Ergocalciferol, (DRISDOL) 50000 units CAPS capsule Take 50,000 Units by mouth every Sunday.      No current facility-administered medications for this visit.     Past Medical History:  Diagnosis Date  . Anemia   . CHF (congestive heart failure) ([place])   . Critical ischemia of lower extremity hospitalized 10/05/2015   right  . Diabetic neuropathy ([place])   . ESRD (end stage renal disease) on dialysis ([place]) 09/28/2011-01/23/2013   M-W-F; [place]  . GERD (gastroesophageal reflux disease)   . High cholesterol   . History of blood transfusion    related to "kidney transplant"  . Hypertension    sees Dr. Carolin [place]  . Metabolic bone disease   . Peripheral vascular disease ([place])   . Pneumonia 09/27/2011  . Retinopathy   . Type II diabetes mellitus ([place])    "controlled with diet"    Past Surgical History:  Procedure Laterality Date  . AMPUTATION Right 10/13/2015   Procedure: Right Transmetatarsal Amputation;  Surgeon: Newt Minion, MD;  Location: [facility];  Service: Orthopedics;  Laterality: Right;  . AMPUTATION Right 11/29/2015   Procedure: RIGHT BELOW KNEE AMPUTATION;  Surgeon: Newt Minion, MD;  Location: [facility];  Service: Orthopedics;  Laterality: Right;  . AV FISTULA PLACEMENT  10/04/2011   Procedure: INSERTION OF ARTERIOVENOUS (AV) GORE-TEX GRAFT ARM;  Surgeon: Angelia Mould, MD;  Location: [facility];  Service: Vascular;  Laterality: Left;  Insertion left upper arm Arteriovenous goretex graft  . AV FISTULA PLACEMENT  10/29/2011   Procedure: ARTERIOVENOUS (AV) FISTULA CREATION;  Surgeon: Angelia Mould, MD;  Location[facility];  Service: Vascular;  Laterality: Right;  Creation Right Arteriovenous Fistula   . [place] REMOVAL  10/04/2011   Procedure: REMOVAL OF ARTERIOVENOUS GORETEX GRAFT ([place]);  Surgeon: Elam Dutch, MD;  Location: [facility];  Service: Vascular;  Laterality: Left;  . CHOLECYSTECTOMY N/A 12/08/2015   Procedure: LAPAROSCOPIC CHOLECYSTECTOMY WITH INTRAOPERATIVE CHOLANGIOGRAM;  Surgeon: Stark Klein, MD;  Location: [facility] [place];  Service: General;  Laterality: N/A;  . ESOPHAGOGASTRODUODENOSCOPY N/A 12/24/2012   Procedure: ESOPHAGOGASTRODUODENOSCOPY (EGD);  Surgeon: Quillian Quince  Merrily Brittle, MD;  Location: [facility] ENDOSCOPY;  Service: Endoscopy;  Laterality: N/A;  . FINGER AMPUTATION Right 02/26/2013   "3rd finger"  . INSERTION OF DIALYSIS CATHETER  10/04/2011   Procedure: INSERTION OF DIALYSIS CATHETER;  Surgeon: Angelia Mould, MD;  Location: [place] [facility];  Service: Vascular;  Laterality: Right;  insertion of dialysis catheter right internal jugular  . INSERTION OF DIALYSIS CATHETER  06/23/2012   Procedure: INSERTION OF DIALYSIS CATHETER;  Surgeon: Angelia Mould, MD;  Location: [facility];  Service: Vascular;  Laterality: N/A;  Ultrasound guided  . KIDNEY TRANSPLANT  01/24/2013  . PATCH ANGIOPLASTY  06/23/2012   Procedure: PATCH ANGIOPLASTY;  Surgeon: Angelia Mould, MD;  Location: [facility];  Service: Vascular;  Laterality: Right;  . PERIPHERAL VASCULAR CATHETERIZATION N/A 09/19/2015   Procedure: Lower Extremity Angiography;  Surgeon: Adrian Prows, MD;  Location: [facility] CV LAB;  Service: Cardiovascular;  Laterality: N/A;  . PERIPHERAL VASCULAR CATHETERIZATION N/A 09/19/2015   Procedure: Abdominal Aortogram;  Surgeon: Adrian Prows, MD;  Location: [place] CV LAB;  Service:  Cardiovascular;  Laterality: N/A;  . PERIPHERAL VASCULAR CATHETERIZATION Right 09/19/2015   Procedure: Peripheral Vascular Intervention;  Surgeon: Adrian Prows, MD;  Location: [facility] CV LAB;  Service: Cardiovascular;  Laterality: Right;  Right Common  Iliac  . PERIPHERAL VASCULAR CATHETERIZATION N/A 10/06/2015   Procedure: Lower Extremity Angiography;  Surgeon: Adrian Prows, MD;  Location: [facility] CV LAB;  Service: Cardiovascular;  Laterality: N/A;  . PERIPHERAL VASCULAR CATHETERIZATION  10/06/2015   Procedure: Peripheral Vascular Intervention;  Surgeon: Adrian Prows, MD;  Location: [place] [facility] LAB;  Service: Cardiovascular;;  REIA  Omnilink 6.0x29, innova 7x80  RSFA innova 5x80  . REVISON OF ARTERIOVENOUS FISTULA  06/23/2012   Procedure: REVISON OF ARTERIOVENOUS FISTULA;  Surgeon: Angelia Mould, MD;  Location: [facility];  Service: Vascular;  Laterality: Right;  Ultrasound guided  . SHUNTOGRAM N/A 04/06/2012   Procedure: Earney Mallet;  Surgeon: Angelia Mould, MD;  Location: [facility] CATH LAB;  Service: Cardiovascular;  Laterality: N/A;    Social History Social History  Substance Use Topics  . Smoking status: Never Smoker  . Smokeless tobacco: Never Used  . Alcohol use No  No IV drug use   Family History Family History  Problem Relation Age of Onset  . Malignant hyperthermia Mother   . Thyroid disease Mother   . Hypertension Mother   . Malignant hyperthermia Father   . Hypertension Father   . Diabetes Brother   . Heart disease Brother     CHF  . Anesthesia problems Neg Hx     No Known Allergies   REVIEW OF SYSTEMS (Negative unless checked)  Constitutional: [] Weight loss  [] Fever  [] Chills Cardiac: [] Chest pain   [] Chest pressure   [] Palpitations   [] Shortness of breath when laying flat   [] Shortness of breath at rest   [] Shortness of breath with exertion. Vascular:  [] Pain in legs with walking   [] Pain in legs at rest   [] Pain in legs when laying flat   [x] Claudication    [] Pain in feet when walking  [] Pain in feet at rest  [] Pain in feet when laying flat   [] History of DVT   [] Phlebitis   [] Swelling in legs   [] Varicose veins   [] Non-healing ulcers Pulmonary:   [] Uses home oxygen   [] Productive cough   [] Hemoptysis   [] Wheeze  [] COPD   [] Asthma Neurologic:  [] Dizziness  [] Blackouts   [] Seizures   [] History  of stroke   [] History of TIA  [] Aphasia   [] Temporary blindness   [] Dysphagia   [] Weakness or numbness in arms   [] Weakness or numbness in legs Musculoskeletal:  [] Arthritis   [] Joint swelling   [x] Joint pain   [] Low back pain Hematologic:  [] Easy bruising  [] Easy bleeding   [] Hypercoagulable state   [] Anemic   Gastrointestinal:  [] Blood in stool   [] Vomiting blood  [] Gastroesophageal reflux/heartburn   [] Abdominal pain Genitourinary:  [x] Chronic kidney disease   [] Difficult urination  [] Frequent urination  [] Burning with urination   [] Hematuria Skin:  [] Rashes   [x] Ulcers   [x] Wounds Psychological:  [] History of anxiety   []  History of major depression.  Physical Examination  BP (!) 160/76 (BP Location: Left Arm)   Pulse 85   Resp 16   Ht 5' (1.524 m)   Wt 138 lb (62.6 kg)   BMI 26.95 kg/m  Gen:  WD/WN, NAD Head: [place]/AT, No temporalis wasting. Ear/Nose/Throat: Hearing grossly intact, nares w/o erythema or drainage, trachea midline Eyes: Conjunctiva clear. Sclera non-icteric Neck: Supple.  No JVD.  Pulmonary:  Good air movement, no use of accessory muscles.  Cardiac: RRR, normal S1, S2 Vascular: Thrill is present in right arm AV fistula. Vessel Right Left  Radial 1+ Palpable Palpable  Ulnar Palpable Palpable  Brachial Palpable Palpable  Carotid Palpable, without bruit Palpable, without bruit  Aorta Not palpable N/A  Femoral Palpable Palpable  Popliteal Palpable Not Palpable  PT Not Palpable 1+ Palpable  DP Not Palpable Not Palpable   Gastrointestinal: soft, non-tender/non-distended. No guarding/reflex.  Musculoskeletal: M/S 5/5 throughout.  Right BKA prosthesis in place and patient is ambulatory. Right hand third digit amputation.   Neurologic: Sensation grossly intact in extremities.  Symmetrical.  Speech is fluent.  Psychiatric: Judgment intact, Mood & affect appropriate for pt's clinical situation. Dermatologic: No rashes or ulcers noted.  No cellulitis or open wounds. Lymph : No Cervical, Axillary, or Inguinal lymphadenopathy.      Labs Recent Results (from the past 2160 hour(s))  VAS [place] LOWER EXTREMITY ARTERIAL DUPLEX     Status: None (In process)   Collection Time: 08/06/16 10:23 AM  Result Value Ref Range   Left super femoral prox sys PSV 109 cm/s   Left super femoral mid sys PSV 27 cm/s   Left super femoral dist sys PSV -25 cm/s   Right super femoral prox sys PSV 93 cm/s   Right super femoral dist sys PSV -70 cm/s   Left ant tibial distal sys -46 cm/s   LEFT PERO DIST SYS 25.00 cm/s    Radiology No results found.   Assessment/Plan  History of renal transplant Currently working well.    S/P unilateral BKA (below knee amputation) ([place]) On the right. Well-healed. Increases her concern over her left lower extremity ischemic symptoms currently has an aunt dictation on that side would be disabling.  End stage renal disease Status post renal transplant.  Type 2 diabetes mellitus with peripheral neuropathy (HCC) blood glucose control important in reducing the progression of atherosclerotic disease. Also, involved in wound healing. On appropriate medications.   HTN (hypertension), malignant blood pressure control important in reducing the progression of atherosclerotic disease. On appropriate oral medications.   PAD (peripheral artery disease) (HCC) Her noninvasive studies today show normal flow throughout the right lower extremity to the popliteal artery which is biphasic. The right SFA stent is patent. On the left, she has markedly elevated velocities in her proximal superficial femoral artery consistent  with a high-grade stenosis with poor, monophasic flow distally. Given these findings as well as her symptoms and already being status post right below-knee dictation, the patient desires to proceed with left lower extremity revascularization at this time. That is certainly reasonable. I discussed the natural history and pathophysiology of peripheral arterial disease. I discussed the risks and benefits of angiography. She is agreeable to proceed and this will be scheduled in the near future at her convenience.    Leotis Pain, MD  08/06/2016 12:01 PM    This note was created with Dragon medical transcription system.  Any errors from dictation are purely unintentional

## 2016-08-06 NOTE — Assessment & Plan Note (Signed)
blood glucose control important in reducing the progression of atherosclerotic disease. Also, involved in wound healing. On appropriate medications.  

## 2016-08-08 ENCOUNTER — Other Ambulatory Visit (INDEPENDENT_AMBULATORY_CARE_PROVIDER_SITE_OTHER): Payer: Self-pay

## 2016-08-09 ENCOUNTER — Encounter
Admission: RE | Admit: 2016-08-09 | Discharge: 2016-08-09 | Disposition: A | Discharge status: Home / Self Care | Payer: Medicare Other | Source: Ambulatory Visit | Attending: Vascular Surgery | Admitting: Vascular Surgery

## 2016-08-09 ENCOUNTER — Other Ambulatory Visit (INDEPENDENT_AMBULATORY_CARE_PROVIDER_SITE_OTHER): Payer: Self-pay

## 2016-08-09 DIAGNOSIS — I739 Peripheral vascular disease, unspecified: Secondary | ICD-10-CM

## 2016-08-09 HISTORY — DX: Peripheral vascular disease, unspecified: I73.9

## 2016-08-09 MED ORDER — SODIUM CHLORIDE 0.9 % IV SOLN: INTRAVENOUS | Status: DC

## 2016-08-11 MED ORDER — CEFAZOLIN IN D5W 1 GM/50ML IV SOLN: 1.0000 g | Freq: Once | INTRAVENOUS | Status: DC

## 2016-08-12 ENCOUNTER — Encounter
Admission: RE | Disposition: A | Discharge status: Home / Self Care | Payer: Self-pay | Source: Ambulatory Visit | Attending: Vascular Surgery

## 2016-08-12 ENCOUNTER — Ambulatory Visit
Admission: RE | Admit: 2016-08-12 | Discharge: 2016-08-12 | Disposition: A | Discharge status: Home / Self Care | Payer: Medicare Other | Source: Ambulatory Visit | Attending: Vascular Surgery | Admitting: Vascular Surgery

## 2016-08-12 ENCOUNTER — Encounter: Payer: Self-pay | Admitting: *Deleted

## 2016-08-12 DIAGNOSIS — Z992 Dependence on renal dialysis: Secondary | ICD-10-CM | POA: Diagnosis not present

## 2016-08-12 DIAGNOSIS — E1122 Type 2 diabetes mellitus with diabetic chronic kidney disease: Secondary | ICD-10-CM | POA: Insufficient documentation

## 2016-08-12 DIAGNOSIS — Z89021 Acquired absence of right finger(s): Secondary | ICD-10-CM | POA: Insufficient documentation

## 2016-08-12 DIAGNOSIS — Z89511 Acquired absence of right leg below knee: Secondary | ICD-10-CM | POA: Diagnosis not present

## 2016-08-12 DIAGNOSIS — E7800 Pure hypercholesterolemia, unspecified: Secondary | ICD-10-CM | POA: Diagnosis not present

## 2016-08-12 DIAGNOSIS — I509 Heart failure, unspecified: Secondary | ICD-10-CM | POA: Insufficient documentation

## 2016-08-12 DIAGNOSIS — Z833 Family history of diabetes mellitus: Secondary | ICD-10-CM | POA: Insufficient documentation

## 2016-08-12 DIAGNOSIS — E1342 Other specified diabetes mellitus with diabetic polyneuropathy: Secondary | ICD-10-CM | POA: Diagnosis not present

## 2016-08-12 DIAGNOSIS — Z7982 Long term (current) use of aspirin: Secondary | ICD-10-CM | POA: Insufficient documentation

## 2016-08-12 DIAGNOSIS — Z8489 Family history of other specified conditions: Secondary | ICD-10-CM | POA: Insufficient documentation

## 2016-08-12 DIAGNOSIS — N186 End stage renal disease: Secondary | ICD-10-CM | POA: Diagnosis not present

## 2016-08-12 DIAGNOSIS — Z940 Kidney transplant status: Secondary | ICD-10-CM | POA: Diagnosis not present

## 2016-08-12 DIAGNOSIS — Z794 Long term (current) use of insulin: Secondary | ICD-10-CM | POA: Diagnosis not present

## 2016-08-12 DIAGNOSIS — I132 Hypertensive heart and chronic kidney disease with heart failure and with stage 5 chronic kidney disease, or end stage renal disease: Secondary | ICD-10-CM | POA: Diagnosis not present

## 2016-08-12 DIAGNOSIS — Z79899 Other long term (current) drug therapy: Secondary | ICD-10-CM | POA: Insufficient documentation

## 2016-08-12 DIAGNOSIS — I70212 Atherosclerosis of native arteries of extremities with intermittent claudication, left leg: Secondary | ICD-10-CM | POA: Insufficient documentation

## 2016-08-12 DIAGNOSIS — Z8249 Family history of ischemic heart disease and other diseases of the circulatory system: Secondary | ICD-10-CM | POA: Insufficient documentation

## 2016-08-12 HISTORY — PX: LOWER EXTREMITY ANGIOGRAPHY: CATH118251

## 2016-08-12 HISTORY — PX: LOWER EXTREMITY INTERVENTION: CATH118252

## 2016-08-12 LAB — BUN: BUN: 23 mg/dL — ABNORMAL HIGH (ref 6–20)

## 2016-08-12 LAB — CREATININE, SERUM
Creatinine, Ser: 0.98 mg/dL (ref 0.44–1.00)
GFR calc Af Amer: >60 mL/min (ref >60)
GFR calc non Af Amer: >60 mL/min (ref >60)

## 2016-08-12 SURGERY — LOWER EXTREMITY ANGIOGRAPHY
Anesthesia: Moderate Sedation | Laterality: Left

## 2016-08-12 MED ORDER — LIDOCAINE-EPINEPHRINE (PF) 2 %-1:200000 IJ SOLN
INTRAMUSCULAR | Status: AC
  Filled 2016-08-12: qty 20

## 2016-08-12 MED ORDER — SODIUM CHLORIDE 0.9 % IV SOLN: 500.0000 mL | Freq: Once | INTRAVENOUS | Status: DC | PRN

## 2016-08-12 MED ORDER — CEFAZOLIN IN D5W 1 GM/50ML IV SOLN
INTRAVENOUS | Status: AC
  Filled 2016-08-12: qty 50

## 2016-08-12 MED ORDER — MIDAZOLAM HCL 2 MG/2ML IJ SOLN
INTRAMUSCULAR | Status: AC
  Filled 2016-08-12: qty 4

## 2016-08-12 MED ORDER — FENTANYL CITRATE (PF) 100 MCG/2ML IJ SOLN
INTRAMUSCULAR | Status: AC
  Filled 2016-08-12: qty 2

## 2016-08-12 MED ORDER — PHENOL 1.4 % MT LIQD
1.0000 | OROMUCOSAL | Status: DC | PRN
  Filled 2016-08-12: qty 177

## 2016-08-12 MED ORDER — HYDRALAZINE HCL 20 MG/ML IJ SOLN: 5.0000 mg | INTRAMUSCULAR | Status: DC | PRN

## 2016-08-12 MED ORDER — GUAIFENESIN-DM 100-10 MG/5ML PO SYRP
15.0000 mL | ORAL_SOLUTION | ORAL | Status: DC | PRN
  Filled 2016-08-12: qty 15

## 2016-08-12 MED ORDER — SODIUM CHLORIDE 0.9 % IV SOLN
INTRAVENOUS | Status: DC
  Administered 2016-08-12: 12:00:00 via INTRAVENOUS

## 2016-08-12 MED ORDER — LABETALOL HCL 5 MG/ML IV SOLN: 10.0000 mg | INTRAVENOUS | Status: DC | PRN

## 2016-08-12 MED ORDER — ACETAMINOPHEN 500 MG PO TABS
1000.0000 mg | ORAL_TABLET | Freq: Four times a day (QID) | ORAL | Status: DC | PRN
  Administered 2016-08-12: 1000 mg via ORAL

## 2016-08-12 MED ORDER — ACETAMINOPHEN 500 MG PO TABS
ORAL_TABLET | ORAL | Status: AC
  Filled 2016-08-12: qty 2

## 2016-08-12 MED ORDER — FENTANYL CITRATE (PF) 100 MCG/2ML IJ SOLN
INTRAMUSCULAR | Status: DC | PRN
  Administered 2016-08-12 (×2): 50 ug via INTRAVENOUS

## 2016-08-12 MED ORDER — CEFAZOLIN IN D5W 1 GM/50ML IV SOLN
1.0000 g | Freq: Once | INTRAVENOUS | Status: AC
  Administered 2016-08-12: 1 g via INTRAVENOUS

## 2016-08-12 MED ORDER — HEPARIN SODIUM (PORCINE) 1000 UNIT/ML IJ SOLN
INTRAMUSCULAR | Status: AC
  Filled 2016-08-12: qty 1

## 2016-08-12 MED ORDER — HYDROMORPHONE HCL 1 MG/ML IJ SOLN: 0.5000 mg | INTRAMUSCULAR | Status: DC | PRN

## 2016-08-12 MED ORDER — HEPARIN SODIUM (PORCINE) 1000 UNIT/ML IJ SOLN
INTRAMUSCULAR | Status: DC | PRN
  Administered 2016-08-12: 4000 [IU] via INTRAVENOUS

## 2016-08-12 MED ORDER — METOPROLOL TARTRATE 5 MG/5ML IV SOLN: 2.0000 mg | INTRAVENOUS | Status: DC | PRN

## 2016-08-12 MED ORDER — ACETAMINOPHEN 325 MG PO TABS: 325.0000 mg | ORAL_TABLET | ORAL | Status: DC | PRN

## 2016-08-12 MED ORDER — MIDAZOLAM HCL 2 MG/2ML IJ SOLN
INTRAMUSCULAR | Status: DC | PRN
  Administered 2016-08-12 (×2): 2 mg via INTRAVENOUS

## 2016-08-12 MED ORDER — HEPARIN (PORCINE) IN NACL 2-0.9 UNIT/ML-% IJ SOLN
INTRAMUSCULAR | Status: AC
  Filled 2016-08-12: qty 1000

## 2016-08-12 MED ORDER — ACETAMINOPHEN 325 MG RE SUPP
325.0000 mg | RECTAL | Status: DC | PRN
  Filled 2016-08-12: qty 2

## 2016-08-12 MED ORDER — OXYCODONE-ACETAMINOPHEN 5-325 MG PO TABS: 1.0000 | ORAL_TABLET | ORAL | Status: DC | PRN

## 2016-08-12 MED ORDER — ONDANSETRON HCL 4 MG/2ML IJ SOLN: 4.0000 mg | Freq: Four times a day (QID) | INTRAMUSCULAR | Status: DC | PRN

## 2016-08-12 SURGICAL SUPPLY — 14 items
BALLN LUTONIX 4X150X130 (BALLOONS) ×6
BALLOON LUTONIX 4X150X130 (BALLOONS) IMPLANT
CATH CXI 4F 90 DAV (CATHETERS) ×1 IMPLANT
CATH PIG 70CM (CATHETERS) ×1 IMPLANT
CATH VERT 100CM (CATHETERS) ×1 IMPLANT
DEVICE PRESTO INFLATION (MISCELLANEOUS) ×1 IMPLANT
DEVICE STARCLOSE SE CLOSURE (Vascular Products) ×1 IMPLANT
GLIDEWIRE ADV .035X260CM (WIRE) ×1 IMPLANT
PACK ANGIOGRAPHY (CUSTOM PROCEDURE TRAY) ×3 IMPLANT
SHEATH ANL2 6FRX45 HC (SHEATH) ×1 IMPLANT
SHEATH BRITE TIP 5FRX11 (SHEATH) ×1 IMPLANT
SYR MEDRAD MARK V 150ML (SYRINGE) ×1 IMPLANT
TUBING CONTRAST HIGH PRESS 72 (TUBING) ×1 IMPLANT
WIRE J 3MM .035X145CM (WIRE) ×1 IMPLANT

## 2016-08-12 NOTE — Discharge Instructions (Signed)

## 2016-08-12 NOTE — Op Note (Signed)
[facility] Percutaneous Study/Intervention Procedural Note   Date of Surgery: 08/12/2016  Surgeon(s):DEW,JASON   Assistants:none  Pre-operative Diagnosis: PAD with claudication left lower extremity  Post-operative diagnosis: Same  Procedure(s) Performed: 1. Ultrasound guidance for vascular access right femoral artery 2. Catheter placement into left popliteal artery from right femoral approach 3. Aortogram and selective left lower extremity angiogram 4. Percutaneous transluminal angioplasty of the entire SFA and proximal popliteal artery with 4 mm diameter Lutonix drug-coated angioplasty balloons 5. StarClose closure device right femoral artery  EBL: Minimal  Contrast: 40 cc  Fluoro Time: 7.6 minutes  Moderate Conscious Sedation Time: approximately 30 minutes using 4 mg of Versed and 100 mcg of Fentanyl  Indications: Patient is a 46 y.o.female with peripheral arterial disease who has claudication in her left leg as artery status post amputation of the right leg. She walks with prosthesis.. The patient has noninvasive study showing SFA stenosis or occlusion on the left. The patient is brought in for angiography for further evaluation and potential treatment. Risks and benefits are discussed and informed consent is obtained  Procedure: The patient was identified and appropriate procedural time out was performed. The patient was then placed supine on the table and prepped and draped in the usual sterile fashion.Moderate conscious sedation was administered during a face to face encounter with the patient throughout the procedure with my supervision of the RN administering medicines and monitoring the patient's vital signs, pulse oximetry, telemetry and mental status throughout from the start of the procedure until the patient was taken to the recovery room. Ultrasound was used to  evaluate the right common femoral artery. It was patent . A digital ultrasound image was acquired. A Seldinger needle was used to access the right common femoral artery under direct ultrasound guidance and a permanent image was performed. A 0.035 J wire was advanced without resistance and a 5Fr sheath was placed. Pigtail catheter was placed into the aorta and an AP aortogram was performed. This demonstrated normal aorta and normal flow in the transplanted right renal artery. The native renal arteries are small. The right iliac artery stents are patent without greater than 50% stenosis. The left iliac system is patent with no greater than 30% stenosis. I then crossed the aortic bifurcation and advanced to the left femoral head. Selective left lower extremity angiogram was then performed. This demonstrated a diffusely diseased SFA with an occlusion proximally with reconstitution in the mid SFA but further high-grade stenosis and possible occlusion in the distal SFA at Hunter's canal. In the above-knee popliteal artery the vessel appeared to normalize. The anterior tibial artery was then the dominant runoff distally with what appeared to be a small peroneal artery is a second vessel. The patient was systemically heparinized and a 6 [place] Ansell sheath was then placed over the [employer] wire. I then used a Kumpe catheter and the advantage wire to navigate through the occlusion and confirm intraluminal flow in the popliteal artery at the knee. The wire was then replaced. I then performed angioplasty with 2 Lutonix drug coated balloons. These were both 4 mm in diameter and 15 cm in length. The first was used in the proximal to mid SFA and inflated to 14 atm for 1 minute.The second inflation was in the distal SFA and proximal popliteal artery and was to 8 atm for one minute. Completion angiogram showed a nonflow limiting dissection with only about a 10-15% residual stenosis in the proximal to mid SFA and no  other hemodynamically significant stenosis of greater than 20% was seen. The remaining brisk flow distally. I elected to terminate the procedure. The sheath was removed and StarClose closure device was deployed in the right femoral artery with excellent hemostatic result. The patient was taken to the recovery room in stable condition having tolerated the procedure well.  Findings:  Aortogram: Normal aorta and normal flow in the transplanted right renal artery. The native renal arteries are small. The right iliac artery stents are patent without greater than 50% stenosis. The left iliac system is patent with no greater than 30% stenosis. Left Lower Extremity: This demonstrated a diffusely diseased SFA with an occlusion proximally with reconstitution in the mid SFA but further high-grade stenosis and possible occlusion in the distal SFA at Hunter's canal. In the above-knee popliteal artery the vessel appeared to normalize. The anterior tibial artery was then the dominant runoff distally with what appeared to be a small peroneal artery is a second vessel   Disposition: Patient was taken to the recovery room in stable condition having tolerated the procedure well.  Complications: None  Leotis Pain 08/12/2016 1:31 PM   This note was created with Dragon Medical transcription system. Any errors in dictation are purely unintentional.

## 2016-08-12 NOTE — Progress Notes (Signed)
Patient clinically stable post left lower angiogram with angioplasty per Dr Lucky Cowboy, right groin without bleeding nor hematoma. Dr Lucky Cowboy out to speak with parents and patient with questions answered.sinus rhythm per monitor.denies complaints.

## 2016-08-12 NOTE — H&P (Signed)
 [facility] History & Physical Update  The patient was interviewed and re-examined.  The patient's previous History and Physical has been reviewed and is unchanged.  There is no change in the plan of care. We plan to proceed with the scheduled procedure.  Leotis Pain, MD  08/12/2016, 10:53 AM

## 2016-08-13 ENCOUNTER — Encounter: Payer: Self-pay | Admitting: Vascular Surgery

## 2016-08-20 ENCOUNTER — Ambulatory Visit (INDEPENDENT_AMBULATORY_CARE_PROVIDER_SITE_OTHER): Payer: Medicare Other | Admitting: Vascular Surgery

## 2016-08-20 ENCOUNTER — Encounter (INDEPENDENT_AMBULATORY_CARE_PROVIDER_SITE_OTHER): Payer: Medicare Other

## 2016-09-09 DIAGNOSIS — N189 Chronic kidney disease, unspecified: Secondary | ICD-10-CM | POA: Diagnosis not present

## 2016-09-09 DIAGNOSIS — N2581 Secondary hyperparathyroidism of renal origin: Secondary | ICD-10-CM | POA: Diagnosis not present

## 2016-09-09 DIAGNOSIS — Z940 Kidney transplant status: Secondary | ICD-10-CM | POA: Diagnosis not present

## 2016-09-10 DIAGNOSIS — N2581 Secondary hyperparathyroidism of renal origin: Secondary | ICD-10-CM | POA: Diagnosis not present

## 2016-09-10 DIAGNOSIS — N182 Chronic kidney disease, stage 2 (mild): Secondary | ICD-10-CM | POA: Diagnosis not present

## 2016-09-10 DIAGNOSIS — Z940 Kidney transplant status: Secondary | ICD-10-CM | POA: Diagnosis not present

## 2016-09-10 DIAGNOSIS — I129 Hypertensive chronic kidney disease with stage 1 through stage 4 chronic kidney disease, or unspecified chronic kidney disease: Secondary | ICD-10-CM | POA: Diagnosis not present

## 2016-09-10 DIAGNOSIS — D631 Anemia in chronic kidney disease: Secondary | ICD-10-CM | POA: Diagnosis not present

## 2016-09-25 ENCOUNTER — Other Ambulatory Visit (INDEPENDENT_AMBULATORY_CARE_PROVIDER_SITE_OTHER): Payer: Self-pay | Admitting: Vascular Surgery

## 2016-09-25 ENCOUNTER — Other Ambulatory Visit (INDEPENDENT_AMBULATORY_CARE_PROVIDER_SITE_OTHER): Payer: Medicare Other

## 2016-09-25 ENCOUNTER — Encounter (INDEPENDENT_AMBULATORY_CARE_PROVIDER_SITE_OTHER): Payer: Self-pay | Admitting: Vascular Surgery

## 2016-09-25 ENCOUNTER — Ambulatory Visit (INDEPENDENT_AMBULATORY_CARE_PROVIDER_SITE_OTHER): Payer: Medicare Other | Admitting: Vascular Surgery

## 2016-09-25 VITALS — BP 176/78 | HR 82 | Resp 17 | Wt 141.0 lb

## 2016-09-25 DIAGNOSIS — Z9862 Peripheral vascular angioplasty status: Secondary | ICD-10-CM | POA: Diagnosis not present

## 2016-09-25 DIAGNOSIS — E1165 Type 2 diabetes mellitus with hyperglycemia: Secondary | ICD-10-CM | POA: Diagnosis not present

## 2016-09-25 DIAGNOSIS — E785 Hyperlipidemia, unspecified: Secondary | ICD-10-CM

## 2016-09-25 DIAGNOSIS — E1142 Type 2 diabetes mellitus with diabetic polyneuropathy: Secondary | ICD-10-CM | POA: Diagnosis not present

## 2016-09-25 DIAGNOSIS — I739 Peripheral vascular disease, unspecified: Secondary | ICD-10-CM

## 2016-09-25 DIAGNOSIS — E1151 Type 2 diabetes mellitus with diabetic peripheral angiopathy without gangrene: Secondary | ICD-10-CM | POA: Diagnosis not present

## 2016-09-25 DIAGNOSIS — IMO0002 Reserved for concepts with insufficient information to code with codable children: Secondary | ICD-10-CM

## 2016-10-16 DIAGNOSIS — Z940 Kidney transplant status: Secondary | ICD-10-CM | POA: Diagnosis not present

## 2016-10-16 DIAGNOSIS — G903 Multi-system degeneration of the autonomic nervous system: Secondary | ICD-10-CM | POA: Diagnosis not present

## 2016-10-16 DIAGNOSIS — R0989 Other specified symptoms and signs involving the circulatory and respiratory systems: Secondary | ICD-10-CM | POA: Diagnosis not present

## 2016-10-16 DIAGNOSIS — I739 Peripheral vascular disease, unspecified: Secondary | ICD-10-CM | POA: Diagnosis not present

## 2016-10-29 DIAGNOSIS — R0989 Other specified symptoms and signs involving the circulatory and respiratory systems: Secondary | ICD-10-CM | POA: Diagnosis not present

## 2016-12-03 DIAGNOSIS — Z89511 Acquired absence of right leg below knee: Secondary | ICD-10-CM | POA: Diagnosis not present

## 2016-12-03 DIAGNOSIS — M2042 Other hammer toe(s) (acquired), left foot: Secondary | ICD-10-CM | POA: Diagnosis not present

## 2016-12-03 DIAGNOSIS — I739 Peripheral vascular disease, unspecified: Secondary | ICD-10-CM | POA: Diagnosis not present

## 2016-12-10 NOTE — Progress Notes (Signed)
Subjective:    Patient ID: [name], female    DOB: 1970-11-24, 46 y.o.   MRN: [id number] Chief Complaint  Patient presents with  . Follow-up   Patient presents for her first post procedure follow-up. She is status post a left lower extremity angiogram with intervention on 08/12/2016 the patient presents today without complaint. Patient reports an improvement in her left lower extremity discomfort since the intervention. The patient underwent a bilateral lower extremity ABI which was notable for noncompressible left lower extremity vessels due to medial calcification. The patient has monophasic waveforms noted in the posterior tibial and anterior tibial vessels. Patient denies any fever, nausea or vomiting.   Review of Systems  Constitutional: Negative.   HENT: Negative.   Eyes: Negative.   Respiratory: Negative.   Cardiovascular: Negative.   Gastrointestinal: Negative.   Endocrine: Negative.   Genitourinary: Negative.   Musculoskeletal: Negative.   Skin: Negative.   Allergic/Immunologic: Negative.   Neurological: Negative.   Hematological: Negative.   Psychiatric/Behavioral: Negative.       Objective:   Physical Exam  Constitutional: She is oriented to person, place, and time. She appears well-developed and well-nourished. No distress.  HENT:  Head: Normocephalic and atraumatic.  Eyes: Conjunctivae are normal. Pupils are equal, round, and reactive to light.  Neck: Normal range of motion.  Cardiovascular: Normal rate, regular rhythm, normal heart sounds and intact distal pulses.   Pulses:      Radial pulses are 2+ on the right side, and 2+ on the left side.  Right lower extremity septations and site intact. Hard to palpate left pedal pulses however her foot is warm.  Pulmonary/Chest: Effort normal.  Musculoskeletal: Normal range of motion. She exhibits no edema.  Neurological: She is alert and oriented to person, place, and time.  Skin: Skin is warm and dry. She is  not diaphoretic.  Psychiatric: She has a normal mood and affect. Her behavior is normal. Judgment and thought content normal.  Vitals reviewed.  BP (!) 176/78   Pulse 82   Resp 17   Wt 141 lb (64 kg)   BMI 27.54 kg/m   Past Medical History:  Diagnosis Date  . Anemia   . CHF (congestive heart failure) ([place])   . Critical ischemia of lower extremity hospitalized 10/05/2015   right  . Diabetic neuropathy ([place])   . ESRD (end stage renal disease) on dialysis ([place]) 09/28/2011-01/23/2013   M-W-F; [place]  . GERD (gastroesophageal reflux disease)   . High cholesterol   . History of blood transfusion    related to "kidney transplant"  . Hypertension    sees Dr. Carolin Guernsey  . Metabolic bone disease   . PAD (peripheral artery disease) ([place])   . Peripheral vascular disease ([place])   . Pneumonia 09/27/2011  . Retinopathy   . Type II diabetes mellitus ([place])    "controlled with diet"   Social History   Social History  . Marital status: Single    Spouse name: N/A  . Number of children: 0  . Years of education: N/A   Occupational History  . Not on file.   Social History Main Topics  . Smoking status: Never Smoker  . Smokeless tobacco: Never Used  . Alcohol use No  . Drug use: No  . Sexual activity: No   Other Topics Concern  . Not on file   Social History Narrative  . No narrative on file   Past Surgical History:  Procedure Laterality Date  .  ABOVE KNEE LEG AMPUTATION Right   . AMPUTATION Right 10/13/2015   Procedure: Right Transmetatarsal Amputation;  Surgeon: Newt Minion, MD;  Location: [facility];  Service: Orthopedics;  Laterality: Right;  . AMPUTATION Right 11/29/2015   Procedure: RIGHT BELOW KNEE AMPUTATION;  Surgeon: Newt Minion, MD;  Location: [facility];  Service: Orthopedics;  Laterality: Right;  . AMPUTATION FINGER     Rt hand middle finger  . AV FISTULA PLACEMENT  10/04/2011   Procedure: INSERTION OF ARTERIOVENOUS (AV) GORE-TEX GRAFT ARM;  Surgeon: Angelia Mould, MD;  Location: [facility];  Service: Vascular;  Laterality: Left;  Insertion left upper arm Arteriovenous goretex graft  . AV FISTULA PLACEMENT  10/29/2011   Procedure: ARTERIOVENOUS (AV) FISTULA CREATION;  Surgeon: Angelia Mould, MD;  Location[facility];  Service: Vascular;  Laterality: Right;  Creation Right Arteriovenous Fistula   . [place] REMOVAL  10/04/2011   Procedure: REMOVAL OF ARTERIOVENOUS GORETEX GRAFT ([place]);  Surgeon: Elam Dutch, MD;  Location: [facility];  Service: Vascular;  Laterality: Left;  . CHOLECYSTECTOMY N/A 12/08/2015   Procedure: LAPAROSCOPIC CHOLECYSTECTOMY WITH INTRAOPERATIVE CHOLANGIOGRAM;  Surgeon: Stark Klein, MD;  Location: [facility];  Service: General;  Laterality: N/A;  . ESOPHAGOGASTRODUODENOSCOPY N/A 12/24/2012   Procedure: ESOPHAGOGASTRODUODENOSCOPY (EGD);  Surgeon: Milus Banister, MD;  Location: [facility];  Service: Endoscopy;  Laterality: N/A;  . FINGER AMPUTATION Right 02/26/2013   "3rd finger"  . INSERTION OF DIALYSIS CATHETER  10/04/2011   Procedure: INSERTION OF DIALYSIS CATHETER;  Surgeon: Angelia Mould, MD;  Location: [place];  Service: Vascular;  Laterality: Right;  insertion of dialysis catheter right internal jugular  . INSERTION OF DIALYSIS CATHETER  06/23/2012   Procedure: INSERTION OF DIALYSIS CATHETER;  Surgeon: Angelia Mould, MD;  Location: [facility];  Service: Vascular;  Laterality: N/A;  Ultrasound guided  . KIDNEY TRANSPLANT  01/24/2013  . LOWER EXTREMITY ANGIOGRAPHY Left 08/12/2016   Procedure: Lower Extremity Angiography;  Surgeon: Algernon Huxley, MD;  Location: [facility] CV LAB;  Service: Cardiovascular;  Laterality: Left;  . LOWER EXTREMITY INTERVENTION  08/12/2016   Procedure: Lower Extremity Intervention;  Surgeon: Algernon Huxley, MD;  Location: [facility] [facility];  Service: Cardiovascular;;  . PATCH ANGIOPLASTY  06/23/2012   Procedure: PATCH ANGIOPLASTY;  Surgeon: Angelia Mould, MD;  Location: [facility];  Service: Vascular;   Laterality: Right;  . PERIPHERAL VASCULAR CATHETERIZATION N/A 09/19/2015   Procedure: Lower Extremity Angiography;  Surgeon: Adrian Prows, MD;  Location: [facility] CV LAB;  Service: Cardiovascular;  Laterality: N/A;  . PERIPHERAL VASCULAR CATHETERIZATION N/A 09/19/2015   Procedure: Abdominal Aortogram;  Surgeon: Adrian Prows, MD;  Location: [facility] [facility];  Service: Cardiovascular;  Laterality: N/A;  . PERIPHERAL VASCULAR CATHETERIZATION Right 09/19/2015   Procedure: Peripheral Vascular Intervention;  Surgeon: Adrian Prows, MD;  Location: [place] CV LAB;  Service: Cardiovascular;  Laterality: Right;  Right Common  Iliac  . PERIPHERAL VASCULAR CATHETERIZATION N/A 10/06/2015   Procedure: Lower Extremity Angiography;  Surgeon: Adrian Prows, MD;  Location: [facility] [facility] LAB;  Service: Cardiovascular;  Laterality: N/A;  . PERIPHERAL VASCULAR CATHETERIZATION  10/06/2015   Procedure: Peripheral Vascular Intervention;  Surgeon: Adrian Prows, MD;  Location: [place] CV LAB;  Service: Cardiovascular;;  REIA  Omnilink 6.0x29, innova 7x80  RSFA innova 5x80  . REVISON OF ARTERIOVENOUS FISTULA  06/23/2012   Procedure: REVISON OF ARTERIOVENOUS FISTULA;  Surgeon: Angelia Mould, MD;  Location: [facility];  Service: Vascular;  Laterality: Right;  Ultrasound guided  . SHUNTOGRAM N/A 04/06/2012   Procedure: Earney Mallet;  Surgeon: Angelia Mould, MD;  Location: [facility] CATH LAB;  Service: Cardiovascular;  Laterality: N/A;   Family History  Problem Relation Age of Onset  . Malignant hyperthermia Mother   . Thyroid disease Mother   . Hypertension Mother   . Malignant hyperthermia Father   . Hypertension Father   . Diabetes Brother   . Heart disease Brother        CHF  . Anesthesia problems Neg Hx    No Known Allergies     Assessment & Plan:  Patient presents for her first post procedure follow-up. She is status post a left lower extremity angiogram with intervention on 08/12/2016 the patient presents today without  complaint. Patient reports an improvement in her left lower extremity discomfort since the intervention. The patient underwent a bilateral lower extremity ABI which was notable for noncompressible left lower extremity vessels due to medial calcification. The patient has monophasic waveforms noted in the posterior tibial and anterior tibial vessels. Patient denies any fever, nausea or vomiting.  1. PAD (peripheral artery disease) ([place]) - stable Patient reports improvement in her left lower extremity discomfort since her recent endovascular intervention Hard to palpate left pedal pulses on physical exam Will bring the patient back in 3 months for an ABI and left lower extremity arterial duplex Patient to remain abstinent of tobacco use. I have discussed with the patient at length the risk factors for and pathogenesis of atherosclerotic disease and encouraged a healthy diet, regular exercise regimen and blood pressure / glucose control.  The patient was encouraged to call the office in the interim if he experiences any claudication like symptoms, rest pain or ulcers to her feet / toes.  - VAS [place] ABI WITH/WO TBI; Future - VAS [place] LOWER EXTREMITY ARTERIAL DUPLEX; Future  2. Uncontrolled type 2 diabetes mellitus with peripheral artery disease (HCC) - stable Encouraged good control as its slows the progression of atherosclerotic disease  3. Hyperlipidemia, unspecified hyperlipidemia type - stable Encouraged good control as its slows the progression of atherosclerotic disease  Current Outpatient Prescriptions on File Prior to Visit  Medication Sig Dispense Refill  . albuterol (PROVENTIL HFA;VENTOLIN HFA) 108 (90 BASE) MCG/ACT inhaler Inhale 1 puff into the lungs every 6 (six) hours as needed for wheezing or shortness of breath.    Marland Kitchen amLODipine (NORVASC) 10 MG tablet Take 10 mg by mouth at bedtime.     . insulin aspart (NOVOLOG) 100 UNIT/ML injection Inject 4-14 Units into the skin 3 (three) times daily  before meals. Per sliding scale: 100-150, 4 units. 151-200, 6 units. 201-250, 8 units. 251-300, 10 units. 301-350, 12 units. Over 350, 14 units.    . insulin detemir (LEVEMIR) 100 UNIT/ML injection Inject 0.06 mLs (6 Units total) into the skin at bedtime. 10 mL 0  . labetalol (NORMODYNE) 300 MG tablet Take 300 mg by mouth at bedtime.   3  . mycophenolate (MYFORTIC) 180 MG EC tablet Take 360-540 mg by mouth 2 (two) times daily. Take three capsules (540mg ) in the morning and two capsules (360mg ) at night.    . pantoprazole (PROTONIX) 40 MG tablet Take 40 mg by mouth at bedtime.     . pravastatin (PRAVACHOL) 20 MG tablet Take 20 mg by mouth at bedtime.     . sulfamethoxazole-trimethoprim (BACTRIM,SEPTRA) 400-80 MG per tablet Take 1 tablet by mouth every Monday, Wednesday, and Friday.     Marland Kitchen  tacrolimus (PROGRAF) 1 MG capsule Take 2 mg by mouth 2 (two) times daily.     . Vitamin D, Ergocalciferol, (DRISDOL) 50000 units CAPS capsule Take 50,000 Units by mouth every Sunday.      Current Facility-Administered Medications on File Prior to Visit  Medication Dose Route Frequency Provider Last Rate Last Dose  . 0.9 %  sodium chloride infusion   Intravenous Continuous Dew, Erskine Squibb, MD      . ceFAZolin (ANCEF) IVPB 1 g/50 mL premix  1 g Intravenous Once Dew, Erskine Squibb, MD        There are no Patient Instructions on file for this visit. No Follow-up on file.   Chequita Mofield A Shacarra Choe, PA-C

## 2016-12-11 ENCOUNTER — Telehealth (INDEPENDENT_AMBULATORY_CARE_PROVIDER_SITE_OTHER): Payer: Self-pay | Admitting: Orthopedic Surgery

## 2016-12-11 NOTE — Telephone Encounter (Signed)
I called patient informed application for handicap parking is signed and ready to be picked up

## 2016-12-25 ENCOUNTER — Encounter (INDEPENDENT_AMBULATORY_CARE_PROVIDER_SITE_OTHER): Payer: Medicare Other

## 2016-12-25 DIAGNOSIS — N189 Chronic kidney disease, unspecified: Secondary | ICD-10-CM | POA: Diagnosis not present

## 2016-12-25 DIAGNOSIS — Z940 Kidney transplant status: Secondary | ICD-10-CM | POA: Diagnosis not present

## 2016-12-25 DIAGNOSIS — D631 Anemia in chronic kidney disease: Secondary | ICD-10-CM | POA: Diagnosis not present

## 2016-12-25 DIAGNOSIS — N182 Chronic kidney disease, stage 2 (mild): Secondary | ICD-10-CM | POA: Diagnosis not present

## 2016-12-25 DIAGNOSIS — I129 Hypertensive chronic kidney disease with stage 1 through stage 4 chronic kidney disease, or unspecified chronic kidney disease: Secondary | ICD-10-CM | POA: Diagnosis not present

## 2016-12-25 DIAGNOSIS — N2581 Secondary hyperparathyroidism of renal origin: Secondary | ICD-10-CM | POA: Diagnosis not present

## 2016-12-27 ENCOUNTER — Ambulatory Visit (INDEPENDENT_AMBULATORY_CARE_PROVIDER_SITE_OTHER): Payer: Medicare Other | Admitting: Vascular Surgery

## 2016-12-27 ENCOUNTER — Encounter (INDEPENDENT_AMBULATORY_CARE_PROVIDER_SITE_OTHER): Payer: Medicare Other

## 2016-12-31 ENCOUNTER — Encounter (INDEPENDENT_AMBULATORY_CARE_PROVIDER_SITE_OTHER): Payer: Self-pay | Admitting: Vascular Surgery

## 2016-12-31 ENCOUNTER — Ambulatory Visit (INDEPENDENT_AMBULATORY_CARE_PROVIDER_SITE_OTHER): Payer: Medicare Other

## 2016-12-31 ENCOUNTER — Ambulatory Visit (INDEPENDENT_AMBULATORY_CARE_PROVIDER_SITE_OTHER): Payer: Medicare Other | Admitting: Vascular Surgery

## 2016-12-31 VITALS — BP 166/86 | HR 90 | Resp 17 | Wt 147.0 lb

## 2016-12-31 DIAGNOSIS — I10 Essential (primary) hypertension: Secondary | ICD-10-CM | POA: Diagnosis not present

## 2016-12-31 DIAGNOSIS — I739 Peripheral vascular disease, unspecified: Secondary | ICD-10-CM

## 2016-12-31 DIAGNOSIS — E785 Hyperlipidemia, unspecified: Secondary | ICD-10-CM | POA: Diagnosis not present

## 2016-12-31 DIAGNOSIS — E1142 Type 2 diabetes mellitus with diabetic polyneuropathy: Secondary | ICD-10-CM

## 2016-12-31 DIAGNOSIS — N186 End stage renal disease: Secondary | ICD-10-CM

## 2016-12-31 NOTE — Progress Notes (Signed)
MRN : [id number]  [name] is a 46 y.o. (Aug 14, 1970) female who presents with chief complaint of  Chief Complaint  Patient presents with  . Re-evaluation    ultrasound follow up  .  History of Present Illness: Patient returns today in follow up of PAD. Several months ago, she underwent left lower extremity revascularization claudication symptoms. Since that time, her symptoms are markedly improved and she is doing well. She is having some pain in her right stump with ambulation. Her noninvasive studies today show 50-74% stenosis of the left proximal superficial femoral artery. These velocities are moderately elevated but not significantly. She then has biphasic flow all the way down to the popliteal with strong monophasic signals into the foot and a good digital waveform with digital pressure of 71. She has known noncompressible vessels so ABIs were deferred.  Current Outpatient Prescriptions  Medication Sig Dispense Refill  . albuterol (PROVENTIL HFA;VENTOLIN HFA) 108 (90 BASE) MCG/ACT inhaler Inhale 1 puff into the lungs every 6 (six) hours as needed for wheezing or shortness of breath.    Marland Kitchen amLODipine (NORVASC) 10 MG tablet Take 10 mg by mouth at bedtime.     . furosemide (LASIX) 40 MG tablet     . insulin aspart (NOVOLOG) 100 UNIT/ML injection Inject 4-14 Units into the skin 3 (three) times daily before meals. Per sliding scale: 100-150, 4 units. 151-200, 6 units. 201-250, 8 units. 251-300, 10 units. 301-350, 12 units. Over 350, 14 units.    . insulin detemir (LEVEMIR) 100 UNIT/ML injection Inject 0.06 mLs (6 Units total) into the skin at bedtime. 10 mL 0  . labetalol (NORMODYNE) 300 MG tablet Take 300 mg by mouth at bedtime.   3  . mycophenolate (MYFORTIC) 180 MG EC tablet Take 360-540 mg by mouth 2 (two) times daily. Take three capsules (540mg ) in the morning and two capsules (360mg ) at night.    . pantoprazole (PROTONIX) 40 MG tablet Take 40 mg by mouth at bedtime.     .  pravastatin (PRAVACHOL) 20 MG tablet Take 20 mg by mouth at bedtime.     . sulfamethoxazole-trimethoprim (BACTRIM,SEPTRA) 400-80 MG per tablet Take 1 tablet by mouth every Monday, Wednesday, and Friday.     . tacrolimus (PROGRAF) 1 MG capsule Take 2 mg by mouth 2 (two) times daily.     . Vitamin D, Ergocalciferol, (DRISDOL) 50000 units CAPS capsule Take 50,000 Units by mouth every Sunday.      Current Facility-Administered Medications  Medication Dose Route Frequency Provider Last Rate Last Dose  . 0.9 %  sodium chloride infusion   Intravenous Continuous Mekenna Finau, Erskine Squibb, MD      . ceFAZolin (ANCEF) IVPB 1 g/50 mL premix  1 g Intravenous Once Algernon Huxley, MD        Past Medical History:  Diagnosis Date  . Anemia   . CHF (congestive heart failure) ([place])   . Critical ischemia of lower extremity hospitalized 10/05/2015   right  . Diabetic neuropathy ([place])   . ESRD (end stage renal disease) on dialysis ([place]) 09/28/2011-01/23/2013   M-W-F; [place]  . GERD (gastroesophageal reflux disease)   . High cholesterol   . History of blood transfusion    related to "kidney transplant"  . Hypertension    sees Dr. Carolin Guernsey  . Metabolic bone disease   . PAD (peripheral artery disease) ([place])   . Peripheral vascular disease ([place])   . Pneumonia 09/27/2011  . Retinopathy   .  Type II diabetes mellitus ([place])    "controlled with diet"    Past Surgical History:  Procedure Laterality Date  . ABOVE KNEE LEG AMPUTATION Right   . AMPUTATION Right 10/13/2015   Procedure: Right Transmetatarsal Amputation;  Surgeon: Newt Minion, MD;  Location: [place];  Service: Orthopedics;  Laterality: Right;  . AMPUTATION Right 11/29/2015   Procedure: RIGHT BELOW KNEE AMPUTATION;  Surgeon: Newt Minion, MD;  Location: [facility];  Service: Orthopedics;  Laterality: Right;  . AMPUTATION FINGER     Rt hand middle finger  . AV FISTULA PLACEMENT  10/04/2011   Procedure: INSERTION OF ARTERIOVENOUS (AV) GORE-TEX GRAFT ARM;   Surgeon: Angelia Mould, MD;  Location: [facility];  Service: Vascular;  Laterality: Left;  Insertion left upper arm Arteriovenous goretex graft  . AV FISTULA PLACEMENT  10/29/2011   Procedure: ARTERIOVENOUS (AV) FISTULA CREATION;  Surgeon: Angelia Mould, MD;  Location:  [facility] OR;  Service: Vascular;  Laterality: Right;  Creation Right Arteriovenous Fistula   . [place] REMOVAL  10/04/2011   Procedure: REMOVAL OF ARTERIOVENOUS GORETEX GRAFT ([place]);  Surgeon: Elam Dutch, MD;  Location: [place];  Service: Vascular;  Laterality: Left;  . CHOLECYSTECTOMY N/A 12/08/2015   Procedure: LAPAROSCOPIC CHOLECYSTECTOMY WITH INTRAOPERATIVE CHOLANGIOGRAM;  Surgeon: Stark Klein, MD;  Location: [facility];  Service: General;  Laterality: N/A;  . ESOPHAGOGASTRODUODENOSCOPY N/A 12/24/2012   Procedure: ESOPHAGOGASTRODUODENOSCOPY (EGD);  Surgeon: Milus Banister, MD;  Location: [place];  Service: Endoscopy;  Laterality: N/A;  . FINGER AMPUTATION Right 02/26/2013   "3rd finger"  . INSERTION OF DIALYSIS CATHETER  10/04/2011   Procedure: INSERTION OF DIALYSIS CATHETER;  Surgeon: Angelia Mould, MD;  Location: [facility];  Service: Vascular;  Laterality: Right;  insertion of dialysis catheter right internal jugular  . INSERTION OF DIALYSIS CATHETER  06/23/2012   Procedure: INSERTION OF DIALYSIS CATHETER;  Surgeon: Angelia Mould, MD;  Location: [place];  Service: Vascular;  Laterality: N/A;  Ultrasound guided  . KIDNEY TRANSPLANT  01/24/2013  . LOWER EXTREMITY ANGIOGRAPHY Left 08/12/2016   Procedure: Lower Extremity Angiography;  Surgeon: Algernon Huxley, MD;  Location: [place] CV LAB;  Service: Cardiovascular;  Laterality: Left;  . LOWER EXTREMITY INTERVENTION  08/12/2016   Procedure: Lower Extremity Intervention;  Surgeon: Algernon Huxley, MD;  Location: [place] CV LAB;  Service: Cardiovascular;;  . PATCH ANGIOPLASTY  06/23/2012   Procedure: PATCH ANGIOPLASTY;  Surgeon: Angelia Mould, MD;  Location: [place];  Service: Vascular;  Laterality: Right;  . PERIPHERAL VASCULAR CATHETERIZATION N/A 09/19/2015   Procedure: Lower Extremity Angiography;  Surgeon: Adrian Prows, MD;  Location: [place] [facility];  Service: Cardiovascular;  Laterality: N/A;  . PERIPHERAL VASCULAR CATHETERIZATION N/A 09/19/2015   Procedure: Abdominal Aortogram;  Surgeon: Adrian Prows, MD;  Location: [place] CV LAB;  Service: Cardiovascular;  Laterality: N/A;  . PERIPHERAL VASCULAR CATHETERIZATION Right 09/19/2015   Procedure: Peripheral Vascular Intervention;  Surgeon: Adrian Prows, MD;  Location: [place] CV LAB;  Service: Cardiovascular;  Laterality: Right;  Right Common  Iliac  . PERIPHERAL VASCULAR CATHETERIZATION N/A 10/06/2015   Procedure: Lower Extremity Angiography;  Surgeon: Adrian Prows, MD;  Location: [place] CV LAB;  Service: Cardiovascular;  Laterality: N/A;  . PERIPHERAL VASCULAR CATHETERIZATION  10/06/2015   Procedure: Peripheral Vascular Intervention;  Surgeon: Adrian Prows, MD;  Location: [place] CV LAB;  Service: Cardiovascular;;  REIA  Omnilink 6.0x29, innova 7x80  RSFA innova 5x80  . REVISON  OF ARTERIOVENOUS FISTULA  06/23/2012   Procedure: REVISON OF ARTERIOVENOUS FISTULA;  Surgeon: Angelia Mould, MD;  Location: [place];  Service: Vascular;  Laterality: Right;  Ultrasound guided  . SHUNTOGRAM N/A 04/06/2012   Procedure: Earney Mallet;  Surgeon: Angelia Mould, MD;  Location: [facility] CATH LAB;  Service: Cardiovascular;  Laterality: N/A;    Social History     Social History  Substance Use Topics  . Smoking status: Never Smoker  . Smokeless tobacco: Never Used  . Alcohol use No  No IV drug use   Family History       Family History  Problem Relation Age of Onset  . Malignant hyperthermia Mother   . Thyroid disease Mother   . Hypertension Mother   . Malignant hyperthermia Father   . Hypertension Father   . Diabetes Brother   . Heart disease Brother     CHF  . Anesthesia problems Neg Hx       No Known Allergies   REVIEW OF SYSTEMS (Negative unless checked)  Constitutional: [] Weight loss  [] Fever  [] Chills Cardiac: [] Chest pain   [] Chest pressure   [] Palpitations   [] Shortness of breath when laying flat   [] Shortness of breath at rest   [] Shortness of breath with exertion. Vascular:  [] Pain in legs with walking   [] Pain in legs at rest   [] Pain in legs when laying flat   [x] Claudication   [] Pain in feet when walking  [] Pain in feet at rest  [] Pain in feet when laying flat   [] History of DVT   [] Phlebitis   [] Swelling in legs   [] Varicose veins   [] Non-healing ulcers Pulmonary:   [] Uses home oxygen   [] Productive cough   [] Hemoptysis   [] Wheeze  [] COPD   [] Asthma Neurologic:  [] Dizziness  [] Blackouts   [] Seizures   [] History of stroke   [] History of TIA  [] Aphasia   [] Temporary blindness   [] Dysphagia   [] Weakness or numbness in arms   [] Weakness or numbness in legs Musculoskeletal:  [] Arthritis   [] Joint swelling   [x] Joint pain   [] Low back pain Hematologic:  [] Easy bruising  [] Easy bleeding   [] Hypercoagulable state   [] Anemic   Gastrointestinal:  [] Blood in stool   [] Vomiting blood  [] Gastroesophageal reflux/heartburn   [] Abdominal pain Genitourinary:  [x] Chronic kidney disease   [] Difficult urination  [] Frequent urination  [] Burning with urination   [] Hematuria Skin:  [] Rashes   [x] Ulcers   [x] Wounds Psychological:  [] History of anxiety   []  History of major depression.    Physical Examination  BP (!) 166/86   Pulse 90   Resp 17   Wt 147 lb (66.7 kg)   BMI 28.71 kg/m  Gen:  WD/WN, NAD Head: [place]/AT, No temporalis wasting. Ear/Nose/Throat: Hearing grossly intact, nares w/o erythema or drainage, trachea midline Eyes: Conjunctiva clear. Sclera non-icteric Neck: Supple.  No JVD.  Pulmonary:  Good air movement, no use of accessory muscles.  Cardiac: RRR, normal S1, S2 Vascular:  Vessel Right Left                              PT Not Palpable Trace  Palpable  DP Not Palpable 1+ Palpable    Musculoskeletal: M/S 5/5 throughout. Walks with a right BKA prosthesis. 1+ left lower extremity edema. Missing digit 3 on her right hand Neurologic: Sensation grossly intact in extremities.  Symmetrical.  Speech is fluent.  Psychiatric: Judgment intact, Mood &  affect appropriate for pt's clinical situation. Dermatologic: No rashes or ulcers noted.  No cellulitis or open wounds.       Labs No results found for this or any previous visit (from the past 2160 hour(s)).  Radiology No results found.   Assessment/Plan History of renal transplant Currently working well.    S/P unilateral BKA (below knee amputation) ([place]) On the right. Well-healed. Increases her concern over her left lower extremity ischemic symptoms currently has an aunt dictation on that side would be disabling.  End stage renal disease Status post renal transplant.  Type 2 diabetes mellitus with peripheral neuropathy (HCC) blood glucose control important in reducing the progression of atherosclerotic disease. Also, involved in wound healing. On appropriate medications.   HTN (hypertension), malignant blood pressure control important in reducing the progression of atherosclerotic disease. On appropriate oral medications.  PAD (peripheral artery disease) (HCC) Her noninvasive studies today show 50-74% stenosis of the left proximal superficial femoral artery. These velocities are moderately elevated but not significantly. She then has biphasic flow all the way down to the popliteal with strong monophasic signals into the foot and a good digital waveform with digital pressure of 71. She has known noncompressible vessels so ABIs were deferred. Her perfusion is currently stable and she is doing well. Continue her current medical regimen. Plan to recheck in 6 months.    Leotis Pain, MD  12/31/2016 4:10 PM    This note was created with Dragon medical transcription  system.  Any errors from dictation are purely unintentional

## 2016-12-31 NOTE — Assessment & Plan Note (Signed)
Her noninvasive studies today show 50-74% stenosis of the left proximal superficial femoral artery. These velocities are moderately elevated but not significantly. She then has biphasic flow all the way down to the popliteal with strong monophasic signals into the foot and a good digital waveform with digital pressure of 71. She has known noncompressible vessels so ABIs were deferred. Her perfusion is currently stable and she is doing well. Continue her current medical regimen. Plan to recheck in 6 months.

## 2016-12-31 NOTE — Patient Instructions (Signed)
Peripheral Vascular Disease Peripheral vascular disease (PVD) is a disease of the blood vessels that are not part of your heart and brain. A simple term for PVD is poor circulation. In most cases, PVD narrows the blood vessels that carry blood from your heart to the rest of your body. This can result in a decreased supply of blood to your arms, legs, and internal organs, like your stomach or kidneys. However, it most often affects a person's lower legs and feet. There are two types of PVD.  Organic PVD. This is the more common type. It is caused by damage to the structure of blood vessels.  Functional PVD. This is caused by conditions that make blood vessels contract and tighten (spasm).  Without treatment, PVD tends to get worse over time. PVD can also lead to acute ischemic limb. This is when an arm or limb suddenly has trouble getting enough blood. This is a medical emergency. What are the causes? Each type of PVD has many different causes. The most common cause of PVD is buildup of a fatty material (plaque) inside of your arteries (atherosclerosis). Small amounts of plaque can break off from the walls of the blood vessels and become lodged in a smaller artery. This blocks blood flow and can cause acute ischemic limb. Other common causes of PVD include:  Blood clots that form inside of blood vessels.  Injuries to blood vessels.  Diseases that cause inflammation of blood vessels or cause blood vessel spasms.  Health behaviors and health history that increase your risk of developing PVD.  What increases the risk? You may have a greater risk of PVD if you:  Have a family history of PVD.  Have certain medical conditions, including: ? High cholesterol. ? Diabetes. ? High blood pressure (hypertension). ? Coronary heart disease. ? Past problems with blood clots. ? Past injury, such as burns or a broken bone. These may have damaged blood vessels in your limbs. ? Buerger disease. This is  caused by inflamed blood vessels in your hands and feet. ? Some forms of arthritis. ? Rare birth defects that affect the arteries in your legs.  Use tobacco.  Do not get enough exercise.  Are obese.  Are age 50 or older.  What are the signs or symptoms? PVD may cause many different symptoms. Your symptoms depend on what part of your body is not getting enough blood. Some common signs and symptoms include:  Cramps in your lower legs. This may be a symptom of poor leg circulation (claudication).  Pain and weakness in your legs while you are physically active that goes away when you rest (intermittent claudication).  Leg pain when at rest.  Leg numbness, tingling, or weakness.  Coldness in a leg or foot, especially when compared with the other leg.  Skin or hair changes. These can include: ? Hair loss. ? Shiny skin. ? Pale or bluish skin. ? Thick toenails.  Inability to get or maintain an erection (erectile dysfunction).  People with PVD are more prone to developing ulcers and sores on their toes, feet, or legs. These may take longer than normal to heal. How is this diagnosed? Your health care provider may diagnose PVD from your signs and symptoms. The health care provider will also do a physical exam. You may have tests to find out what is causing your PVD and determine its severity. Tests may include:  Blood pressure recordings from your arms and legs and measurements of the strength of your pulses (  pulse volume recordings).  Imaging studies using sound waves to take pictures of the blood flow through your blood vessels (Doppler ultrasound).  Injecting a dye into your blood vessels before having imaging studies using: ? X-rays (angiogram or arteriogram). ? Computer-generated X-rays (CT angiogram). ? A powerful electromagnetic field and a computer (magnetic resonance angiogram or MRA).  How is this treated? Treatment for PVD depends on the cause of your condition and the  severity of your symptoms. It also depends on your age. Underlying causes need to be treated and controlled. These include long-lasting (chronic) conditions, such as diabetes, high cholesterol, and high blood pressure. You may need to first try making lifestyle changes and taking medicines. Surgery may be needed if these do not work. Lifestyle changes may include:  Quitting smoking.  Exercising regularly.  Following a low-fat, low-cholesterol diet.  Medicines may include:  Blood thinners to prevent blood clots.  Medicines to improve blood flow.  Medicines to improve your blood cholesterol levels.  Surgical procedures may include:  A procedure that uses an inflated balloon to open a blocked artery and improve blood flow (angioplasty).  A procedure to put in a tube (stent) to keep a blocked artery open (stent implant).  Surgery to reroute blood flow around a blocked artery (peripheral bypass surgery).  Surgery to remove dead tissue from an infected wound on the affected limb.  Amputation. This is surgical removal of the affected limb. This may be necessary in cases of acute ischemic limb that are not improved through medical or surgical treatments.  Follow these instructions at home:  Take medicines only as directed by your health care provider.  Do not use any tobacco products, including cigarettes, chewing tobacco, or electronic cigarettes. If you need help quitting, ask your health care provider.  Lose weight if you are overweight, and maintain a healthy weight as directed by your health care provider.  Eat a diet that is low in fat and cholesterol. If you need help, ask your health care provider.  Exercise regularly. Ask your health care provider to suggest some good activities for you.  Use compression stockings or other mechanical devices as directed by your health care provider.  Take good care of your feet. ? Wear comfortable shoes that fit well. ? Check your feet  often for any cuts or sores. Contact a health care provider if:  You have cramps in your legs while walking.  You have leg pain when you are at rest.  You have coldness in a leg or foot.  Your skin changes.  You have erectile dysfunction.  You have cuts or sores on your feet that are not healing. Get help right away if:  Your arm or leg turns cold and blue.  Your arms or legs become red, warm, swollen, painful, or numb.  You have chest pain or trouble breathing.  You suddenly have weakness in your face, arm, or leg.  You become very confused or lose the ability to speak.  You suddenly have a very bad headache or lose your vision. This information is not intended to replace advice given to you by your health care provider. Make sure you discuss any questions you have with your health care provider. Document Released: 07/11/2004 Document Revised: 11/09/2015 Document Reviewed: 11/11/2013 Elsevier Interactive Patient Education  2017 Elsevier Inc.  

## 2017-01-27 DIAGNOSIS — B259 Cytomegaloviral disease, unspecified: Secondary | ICD-10-CM | POA: Diagnosis not present

## 2017-01-27 DIAGNOSIS — Z794 Long term (current) use of insulin: Secondary | ICD-10-CM | POA: Diagnosis not present

## 2017-01-27 DIAGNOSIS — D631 Anemia in chronic kidney disease: Secondary | ICD-10-CM | POA: Diagnosis not present

## 2017-01-27 DIAGNOSIS — Z89511 Acquired absence of right leg below knee: Secondary | ICD-10-CM | POA: Diagnosis not present

## 2017-01-27 DIAGNOSIS — Z940 Kidney transplant status: Secondary | ICD-10-CM | POA: Diagnosis not present

## 2017-01-27 DIAGNOSIS — I120 Hypertensive chronic kidney disease with stage 5 chronic kidney disease or end stage renal disease: Secondary | ICD-10-CM | POA: Diagnosis not present

## 2017-01-27 DIAGNOSIS — Z79899 Other long term (current) drug therapy: Secondary | ICD-10-CM | POA: Diagnosis not present

## 2017-01-27 DIAGNOSIS — N186 End stage renal disease: Secondary | ICD-10-CM | POA: Diagnosis not present

## 2017-01-27 DIAGNOSIS — E1122 Type 2 diabetes mellitus with diabetic chronic kidney disease: Secondary | ICD-10-CM | POA: Diagnosis not present

## 2017-01-27 DIAGNOSIS — I739 Peripheral vascular disease, unspecified: Secondary | ICD-10-CM | POA: Diagnosis not present

## 2017-01-27 DIAGNOSIS — Z4822 Encounter for aftercare following kidney transplant: Secondary | ICD-10-CM | POA: Diagnosis not present

## 2017-01-27 DIAGNOSIS — Z7722 Contact with and (suspected) exposure to environmental tobacco smoke (acute) (chronic): Secondary | ICD-10-CM | POA: Diagnosis not present

## 2017-01-27 DIAGNOSIS — E872 Acidosis: Secondary | ICD-10-CM | POA: Diagnosis not present

## 2017-01-27 DIAGNOSIS — D899 Disorder involving the immune mechanism, unspecified: Secondary | ICD-10-CM | POA: Diagnosis not present

## 2017-01-27 DIAGNOSIS — E1151 Type 2 diabetes mellitus with diabetic peripheral angiopathy without gangrene: Secondary | ICD-10-CM | POA: Diagnosis not present

## 2017-01-27 DIAGNOSIS — E785 Hyperlipidemia, unspecified: Secondary | ICD-10-CM | POA: Diagnosis not present

## 2017-02-07 ENCOUNTER — Encounter (INDEPENDENT_AMBULATORY_CARE_PROVIDER_SITE_OTHER): Payer: Medicare Other

## 2017-02-07 ENCOUNTER — Ambulatory Visit (INDEPENDENT_AMBULATORY_CARE_PROVIDER_SITE_OTHER): Payer: Medicare Other | Admitting: Vascular Surgery

## 2017-02-25 DIAGNOSIS — H538 Other visual disturbances: Secondary | ICD-10-CM | POA: Diagnosis not present

## 2017-02-25 DIAGNOSIS — Z5181 Encounter for therapeutic drug level monitoring: Secondary | ICD-10-CM | POA: Diagnosis not present

## 2017-02-25 DIAGNOSIS — Z0000 Encounter for general adult medical examination without abnormal findings: Secondary | ICD-10-CM | POA: Diagnosis not present

## 2017-02-25 DIAGNOSIS — I10 Essential (primary) hypertension: Secondary | ICD-10-CM | POA: Diagnosis not present

## 2017-02-25 DIAGNOSIS — Z01118 Encounter for examination of ears and hearing with other abnormal findings: Secondary | ICD-10-CM | POA: Diagnosis not present

## 2017-02-25 DIAGNOSIS — Z136 Encounter for screening for cardiovascular disorders: Secondary | ICD-10-CM | POA: Diagnosis not present

## 2017-02-25 DIAGNOSIS — E119 Type 2 diabetes mellitus without complications: Secondary | ICD-10-CM | POA: Diagnosis not present

## 2017-03-10 ENCOUNTER — Encounter (INDEPENDENT_AMBULATORY_CARE_PROVIDER_SITE_OTHER): Payer: Self-pay | Admitting: Orthopedic Surgery

## 2017-03-10 ENCOUNTER — Ambulatory Visit (INDEPENDENT_AMBULATORY_CARE_PROVIDER_SITE_OTHER): Payer: Medicare Other | Admitting: Orthopedic Surgery

## 2017-03-10 DIAGNOSIS — Z89511 Acquired absence of right leg below knee: Secondary | ICD-10-CM

## 2017-03-10 NOTE — Progress Notes (Signed)
Office Visit Note   Patient: [name]           Date of Birth: 1971-04-04           MRN: [id number] Visit Date: 03/10/2017              Requested by: Benito Mccreedy, MD [address] [place], [place] [address] PCP: Benito Mccreedy, MD  Chief Complaint  Patient presents with  . Right Leg - Follow-up      HPI: Patient is a 46 year old woman who is about 15 months status post right transtibial amputation. Patient has had a persistent loss of volume in the residual limb has been end bearing on the inferior pole of patella and has developed a blister and scab. Patient states she is also very active in outdoor's activities around water and will need a swimming prosthetic leg in order to perform her water activities.  Assessment & Plan: Visit Diagnoses:  1. Acquired absence of right leg below knee [facility])     Plan: Patient is given a prescription for new socket liner and materials for the right transtibial amputation. She was also given a prescription for a swimming leg.  Follow-Up Instructions: Return if symptoms worsen or fail to improve.   Ortho Exam  Patient is alert, oriented, no adenopathy, well-dressed, normal affect, normal respiratory effort. Examination patient has a healed pressure ulcer over the inferior pole the patella secondary to the end bearing from the prosthetic leg. Patient has no rotational deformity and has an unstable loose fitting to the prosthesis. There is no redness no cellulitis no signs of infection no dystrophic changes.  Imaging: No results found. No images are attached to the encounter.  Labs: Lab Results  Component Value Date   HGBA1C 8.3 (H) 10/13/2015   HGBA1C 6.3 (H) 12/20/2012   HGBA1C 6.1 (H) 09/27/2011   REPTSTATUS 12/09/2015 FINAL 12/06/2015   CULT 40,000 COLONIES/mL ESCHERICHIA COLI (A) 12/06/2015   LABORGA ESCHERICHIA COLI (A) 12/06/2015    Orders:  No orders of the defined types were placed in this  encounter.  No orders of the defined types were placed in this encounter.    Procedures: No procedures performed  Clinical Data: No additional findings.  ROS:  All other systems negative, except as noted in the HPI. Review of Systems  Objective: Vital Signs: There were no vitals taken for this visit.  Specialty Comments:  No specialty comments available.  PMFS History: Patient Active Problem List   Diagnosis Date Noted  . Transaminitis 12/06/2015  . Acute on chronic renal failure ([place]) 12/06/2015  . History of renal transplant 12/06/2015  . UTI (lower urinary tract infection) 12/06/2015  . Acute cholecystitis 12/06/2015  . Below knee amputation status ([place]) 11/29/2015  . Asystole ([place])   . S/P unilateral BKA (below knee amputation) ([place])   . Pericardial effusion   . Type 2 diabetes mellitus with peripheral neuropathy (HCC)   . Chronic kidney disease, stage IV (severe) ([place])   . Essential hypertension   . Anemia of chronic disease   . Acute blood loss anemia   . Gangrene associated with diabetes mellitus ([place]) 10/13/2015  . Critical lower limb ischemia 10/05/2015  . PAD (peripheral artery disease) ([place]) 09/18/2015  . Leukopenia 08/22/2014  . Protein-calorie malnutrition, severe ([place]) 12/24/2012  . Gastric ulcer 12/24/2012  . Mechanical complication of other vascular device, implant, and graft 09/22/2012  . End stage renal disease ([place]) 10/23/2011  . Physical deconditioning 10/09/2011  .  HTN (hypertension), malignant 09/27/2011  . Chronic kidney disease, stage 4, severely decreased GFR (HCC) 09/27/2011  . PNA (pneumonia) 09/27/2011  . Acute respiratory failure with hypoxia ([place]) 09/27/2011  . VOMITING 12/06/2009  . GOUT 12/21/2008  . Uncontrolled type 2 diabetes mellitus with peripheral artery disease ([place]) 08/14/2006  . Hyperlipidemia 08/14/2006  . OBESITY, NOS 08/14/2006  . FIBROADENOSIS, BREAST 08/14/2006  . Irregular menstrual cycle 08/14/2006   Past  Medical History:  Diagnosis Date  . Anemia   . CHF (congestive heart failure) ([place])   . Critical ischemia of lower extremity hospitalized 10/05/2015   right  . Diabetic neuropathy ([place])   . ESRD (end stage renal disease) on dialysis ([place]) 09/28/2011-01/23/2013   M-W-F; [place]  . GERD (gastroesophageal reflux disease)   . High cholesterol   . History of blood transfusion    related to "kidney transplant"  . Hypertension    sees Dr. Carolin Guernsey  . Metabolic bone disease   . PAD (peripheral artery disease) ([place])   . Peripheral vascular disease ([place])   . Pneumonia 09/27/2011  . Retinopathy   . Type II diabetes mellitus ([place])    "controlled with diet"    Family History  Problem Relation Age of Onset  . Malignant hyperthermia Mother   . Thyroid disease Mother   . Hypertension Mother   . Malignant hyperthermia Father   . Hypertension Father   . Diabetes Brother   . Heart disease Brother        CHF  . Anesthesia problems Neg Hx     Past Surgical History:  Procedure Laterality Date  . ABOVE KNEE LEG AMPUTATION Right   . AMPUTATION Right 10/13/2015   Procedure: Right Transmetatarsal Amputation;  Surgeon: Newt Minion, MD;  Location: [place];  Service: Orthopedics;  Laterality: Right;  . AMPUTATION Right 11/29/2015   Procedure: RIGHT BELOW KNEE AMPUTATION;  Surgeon: Newt Minion, MD;  Location: [place];  Service: Orthopedics;  Laterality: Right;  . AMPUTATION FINGER     Rt hand middle finger  . AV FISTULA PLACEMENT  10/04/2011   Procedure: INSERTION OF ARTERIOVENOUS (AV) GORE-TEX GRAFT ARM;  Surgeon: Angelia Mould, MD;  Location: [place];  Service: Vascular;  Laterality: Left;  Insertion left upper arm Arteriovenous goretex graft  . AV FISTULA PLACEMENT  10/29/2011   Procedure: ARTERIOVENOUS (AV) FISTULA CREATION;  Surgeon: Angelia Mould, MD;  Location[facility];  Service: Vascular;  Laterality: Right;  Creation Right Arteriovenous Fistula   . [place] REMOVAL  10/04/2011    Procedure: REMOVAL OF ARTERIOVENOUS GORETEX GRAFT ([place]);  Surgeon: Elam Dutch, MD;  Location: [place];  Service: Vascular;  Laterality: Left;  . CHOLECYSTECTOMY N/A 12/08/2015   Procedure: LAPAROSCOPIC CHOLECYSTECTOMY WITH INTRAOPERATIVE CHOLANGIOGRAM;  Surgeon: Stark Klein, MD;  Location: [place];  Service: General;  Laterality: N/A;  . ESOPHAGOGASTRODUODENOSCOPY N/A 12/24/2012   Procedure: ESOPHAGOGASTRODUODENOSCOPY (EGD);  Surgeon: Milus Banister, MD;  Location: [place];  Service: Endoscopy;  Laterality: N/A;  . FINGER AMPUTATION Right 02/26/2013   "3rd finger"  . INSERTION OF DIALYSIS CATHETER  10/04/2011   Procedure: INSERTION OF DIALYSIS CATHETER;  Surgeon: Angelia Mould, MD;  Location: [place] [facility];  Service: Vascular;  Laterality: Right;  insertion of dialysis catheter right internal jugular  . INSERTION OF DIALYSIS CATHETER  06/23/2012   Procedure: INSERTION OF DIALYSIS CATHETER;  Surgeon: Angelia Mould, MD;  Location: [place];  Service: Vascular;  Laterality: N/A;  Ultrasound guided  .  KIDNEY TRANSPLANT  01/24/2013  . LOWER EXTREMITY ANGIOGRAPHY Left 08/12/2016   Procedure: Lower Extremity Angiography;  Surgeon: Algernon Huxley, MD;  Location: [place] [facility];  Service: Cardiovascular;  Laterality: Left;  . LOWER EXTREMITY INTERVENTION  08/12/2016   Procedure: Lower Extremity Intervention;  Surgeon: Algernon Huxley, MD;  Location: [place] [facility];  Service: Cardiovascular;;  . PATCH ANGIOPLASTY  06/23/2012   Procedure: PATCH ANGIOPLASTY;  Surgeon: Angelia Mould, MD;  Location: [place];  Service: Vascular;  Laterality: Right;  . PERIPHERAL VASCULAR CATHETERIZATION N/A 09/19/2015   Procedure: Lower Extremity Angiography;  Surgeon: Adrian Prows, MD;  Location: [place] [facility] [facility];  Service: Cardiovascular;  Laterality: N/A;  . PERIPHERAL VASCULAR CATHETERIZATION N/A 09/19/2015   Procedure: Abdominal Aortogram;  Surgeon: Adrian Prows, MD;  Location: [place] [facility];  Service:  Cardiovascular;  Laterality: N/A;  . PERIPHERAL VASCULAR CATHETERIZATION Right 09/19/2015   Procedure: Peripheral Vascular Intervention;  Surgeon: Adrian Prows, MD;  Location: [place] [facility];  Service: Cardiovascular;  Laterality: Right;  Right Common  Iliac  . PERIPHERAL VASCULAR CATHETERIZATION N/A 10/06/2015   Procedure: Lower Extremity Angiography;  Surgeon: Adrian Prows, MD;  Location: [place] [facility];  Service: Cardiovascular;  Laterality: N/A;  . PERIPHERAL VASCULAR CATHETERIZATION  10/06/2015   Procedure: Peripheral Vascular Intervention;  Surgeon: Adrian Prows, MD;  Location: [place] [facility];  Service: Cardiovascular;;  REIA  Omnilink 6.0x29, innova 7x80  RSFA innova 5x80  . REVISON OF ARTERIOVENOUS FISTULA  06/23/2012   Procedure: REVISON OF ARTERIOVENOUS FISTULA;  Surgeon: Angelia Mould, MD;  Location: [place];  Service: Vascular;  Laterality: Right;  Ultrasound guided  . SHUNTOGRAM N/A 04/06/2012   Procedure: [place] Mal[place];  Surgeon: Angelia Mould, MD;  Location: [facility] CATH LAB;  Service: Cardiovascular;  Laterality: N/A;   Social History   Occupational History  . Not on file.   Social History Main Topics  . Smoking status: Never Smoker  . Smokeless tobacco: Never Used  . Alcohol use No  . Drug use: No  . Sexual activity: No

## 2017-03-25 DIAGNOSIS — K219 Gastro-esophageal reflux disease without esophagitis: Secondary | ICD-10-CM | POA: Diagnosis not present

## 2017-03-25 DIAGNOSIS — E119 Type 2 diabetes mellitus without complications: Secondary | ICD-10-CM | POA: Diagnosis not present

## 2017-03-25 DIAGNOSIS — E785 Hyperlipidemia, unspecified: Secondary | ICD-10-CM | POA: Diagnosis not present

## 2017-03-25 DIAGNOSIS — N390 Urinary tract infection, site not specified: Secondary | ICD-10-CM | POA: Diagnosis not present

## 2017-03-25 DIAGNOSIS — I10 Essential (primary) hypertension: Secondary | ICD-10-CM | POA: Diagnosis not present

## 2017-03-28 ENCOUNTER — Ambulatory Visit (INDEPENDENT_AMBULATORY_CARE_PROVIDER_SITE_OTHER): Payer: Medicare Other | Admitting: Vascular Surgery

## 2017-03-28 ENCOUNTER — Encounter (INDEPENDENT_AMBULATORY_CARE_PROVIDER_SITE_OTHER): Payer: Self-pay | Admitting: Vascular Surgery

## 2017-03-28 ENCOUNTER — Ambulatory Visit (INDEPENDENT_AMBULATORY_CARE_PROVIDER_SITE_OTHER): Payer: Medicare Other

## 2017-03-28 VITALS — BP 118/70 | HR 82 | Resp 14 | Ht 60.0 in | Wt 150.0 lb

## 2017-03-28 DIAGNOSIS — I10 Essential (primary) hypertension: Secondary | ICD-10-CM | POA: Diagnosis not present

## 2017-03-28 DIAGNOSIS — T82898D Other specified complication of vascular prosthetic devices, implants and grafts, subsequent encounter: Secondary | ICD-10-CM

## 2017-03-28 DIAGNOSIS — Z89519 Acquired absence of unspecified leg below knee: Secondary | ICD-10-CM

## 2017-03-28 DIAGNOSIS — E785 Hyperlipidemia, unspecified: Secondary | ICD-10-CM | POA: Diagnosis not present

## 2017-03-28 DIAGNOSIS — E1142 Type 2 diabetes mellitus with diabetic polyneuropathy: Secondary | ICD-10-CM

## 2017-03-28 DIAGNOSIS — N186 End stage renal disease: Secondary | ICD-10-CM

## 2017-03-28 DIAGNOSIS — T82898A Other specified complication of vascular prosthetic devices, implants and grafts, initial encounter: Secondary | ICD-10-CM | POA: Insufficient documentation

## 2017-03-28 NOTE — Assessment & Plan Note (Signed)
Her noninvasive studies today demonstrate no focal stenosis within the bypass or the AV fistula in the right upper extremity, but there are diminished flow in the right hand and digits that do improve somewhat with compression of the AV fistula. At current, she has some recurrent steal symptoms but they are pretty mild.  We discussed that we can ligate her fistula but as long as her symptoms are mild, I would like to leave it in place in case she needs it in the future if her kidney transplant fails.  I will plan to see her back in 6 months or sooner if problems develop in the interim

## 2017-03-28 NOTE — Progress Notes (Signed)
MRN : [id number]  [name] is a 46 y.o. (February 14, 1971) female who presents with chief complaint of  Chief Complaint  Patient presents with  . Follow-up    6 month HDA f/u  .  History of Present Illness: Patient returns today in follow up of her right arm access and right hand pain.  She has already lost a digit on the right hand and underwent a drill procedure several years ago.  She is now off dialysis after getting a kidney transplant.  She denies any current ulceration or infection.  She says she will occasionally get cramps and pain in her right hand but they are not that bothersome to her. Her noninvasive studies today demonstrate no focal stenosis within the bypass or the AV fistula in the right upper extremity, but there are diminished flow in the right hand and digits that do improve somewhat with compression of the AV fistula.  Current Outpatient Prescriptions  Medication Sig Dispense Refill  . albuterol (PROVENTIL HFA;VENTOLIN HFA) 108 (90 BASE) MCG/ACT inhaler Inhale 1 puff into the lungs every 6 (six) hours as needed for wheezing or shortness of breath.    Marland Kitchen amLODipine (NORVASC) 10 MG tablet Take 10 mg by mouth at bedtime.     . furosemide (LASIX) 40 MG tablet     . insulin aspart (NOVOLOG) 100 UNIT/ML injection Inject 4-14 Units into the skin 3 (three) times daily before meals. Per sliding scale: 100-150, 4 units. 151-200, 6 units. 201-250, 8 units. 251-300, 10 units. 301-350, 12 units. Over 350, 14 units.    . insulin detemir (LEVEMIR) 100 UNIT/ML injection Inject 0.06 mLs (6 Units total) into the skin at bedtime. 10 mL 0  . labetalol (NORMODYNE) 300 MG tablet Take 300 mg by mouth at bedtime.   3  . mycophenolate (MYFORTIC) 180 MG EC tablet Take 360-540 mg by mouth 2 (two) times daily. Take three capsules (540mg ) in the morning and two capsules (360mg ) at night.    . nitrofurantoin, macrocrystal-monohydrate, (MACROBID) 100 MG capsule     . pantoprazole (PROTONIX) 40 MG  tablet Take 40 mg by mouth at bedtime.     . pravastatin (PRAVACHOL) 20 MG tablet Take 20 mg by mouth at bedtime.     . sulfamethoxazole-trimethoprim (BACTRIM,SEPTRA) 400-80 MG per tablet Take 1 tablet by mouth every Monday, Wednesday, and Friday.     . tacrolimus (PROGRAF) 1 MG capsule Take 2 mg by mouth 2 (two) times daily.     . Vitamin D, Ergocalciferol, (DRISDOL) 50000 units CAPS capsule Take 50,000 Units by mouth every Sunday.      Current Facility-Administered Medications  Medication Dose Route Frequency Provider Last Rate Last Dose  . 0.9 %  sodium chloride infusion   Intravenous Continuous Airen Dales, Erskine Squibb, MD      . ceFAZolin (ANCEF) IVPB 1 g/50 mL premix  1 g Intravenous Once Algernon Huxley, MD        Past Medical History:  Diagnosis Date  . Anemia   . CHF (congestive heart failure) ([place])   . Critical ischemia of lower extremity hospitalized 10/05/2015   right  . Diabetic neuropathy ([place])   . ESRD (end stage renal disease) on dialysis ([place]) 09/28/2011-01/23/2013   M-W-F; [place]  . GERD (gastroesophageal reflux disease)   . High cholesterol   . History of blood transfusion    related to "kidney transplant"  . Hypertension    sees Dr. Carolin Guernsey  . Metabolic bone  disease   . PAD (peripheral artery disease) ([place])   . Peripheral vascular disease ([place])   . Pneumonia 09/27/2011  . Retinopathy   . Type II diabetes mellitus ([place])    "controlled with diet"    Past Surgical History:  Procedure Laterality Date  . ABOVE KNEE LEG AMPUTATION Right   . AMPUTATION Right 10/13/2015   Procedure: Right Transmetatarsal Amputation;  Surgeon: Newt Minion, MD;  Location: [place];  Service: Orthopedics;  Laterality: Right;  . AMPUTATION Right 11/29/2015   Procedure: RIGHT BELOW KNEE AMPUTATION;  Surgeon: Newt Minion, MD;  Location: [place];  Service: Orthopedics;  Laterality: Right;  . AMPUTATION FINGER     Rt hand middle finger  . AV FISTULA PLACEMENT  10/04/2011   Procedure: INSERTION  OF ARTERIOVENOUS (AV) GORE-TEX GRAFT ARM;  Surgeon: Angelia Mould, MD;  Location: [place];  Service: Vascular;  Laterality: Left;  Insertion left upper arm Arteriovenous goretex graft  . AV FISTULA PLACEMENT  10/29/2011   Procedure: ARTERIOVENOUS (AV) FISTULA CREATION;  Surgeon: Angelia Mould, MD;  Location: [facility];  Service: Vascular;  Laterality: Right;  Creation Right Arteriovenous Fistula   . [place] REMOVAL  10/04/2011   Procedure: REMOVAL OF ARTERIOVENOUS GORETEX GRAFT ([place]);  Surgeon: Elam Dutch, MD;  Location: [place];  Service: Vascular;  Laterality: Left;  . CHOLECYSTECTOMY N/A 12/08/2015   Procedure: LAPAROSCOPIC CHOLECYSTECTOMY WITH INTRAOPERATIVE CHOLANGIOGRAM;  Surgeon: Stark Klein, MD;  Location: [place];  Service: General;  Laterality: N/A;  . ESOPHAGOGASTRODUODENOSCOPY N/A 12/24/2012   Procedure: ESOPHAGOGASTRODUODENOSCOPY (EGD);  Surgeon: Milus Banister, MD;  Location: [place];  Service: Endoscopy;  Laterality: N/A;  . FINGER AMPUTATION Right 02/26/2013   "3rd finger"  . INSERTION OF DIALYSIS CATHETER  10/04/2011   Procedure: INSERTION OF DIALYSIS CATHETER;  Surgeon: Angelia Mould, MD;  Location: [place];  Service: Vascular;  Laterality: Right;  insertion of dialysis catheter right internal jugular  . INSERTION OF DIALYSIS CATHETER  06/23/2012   Procedure: INSERTION OF DIALYSIS CATHETER;  Surgeon: Angelia Mould, MD;  Location: [place];  Service: Vascular;  Laterality: N/A;  Ultrasound guided  . KIDNEY TRANSPLANT  01/24/2013  . LOWER EXTREMITY ANGIOGRAPHY Left 08/12/2016   Procedure: Lower Extremity Angiography;  Surgeon: Algernon Huxley, MD;  Location: [place] [facility];  Service: Cardiovascular;  Laterality: Left;  . LOWER EXTREMITY INTERVENTION  08/12/2016   Procedure: Lower Extremity Intervention;  Surgeon: Algernon Huxley, MD;  Location: [place] [facility];  Service: Cardiovascular;;  . PATCH ANGIOPLASTY  06/23/2012   Procedure: PATCH ANGIOPLASTY;  Surgeon:  Angelia Mould, MD;  Location: [place];  Service: Vascular;  Laterality: Right;  . PERIPHERAL VASCULAR CATHETERIZATION N/A 09/19/2015   Procedure: Lower Extremity Angiography;  Surgeon: Adrian Prows, MD;  Location: [place] [facility];  Service: Cardiovascular;  Laterality: N/A;  . PERIPHERAL VASCULAR CATHETERIZATION N/A 09/19/2015   Procedure: Abdominal Aortogram;  Surgeon: Adrian Prows, MD;  Location: [place] [facility];  Service: Cardiovascular;  Laterality: N/A;  . PERIPHERAL VASCULAR CATHETERIZATION Right 09/19/2015   Procedure: Peripheral Vascular Intervention;  Surgeon: Adrian Prows, MD;  Location: [place] [facility];  Service: Cardiovascular;  Laterality: Right;  Right Common  Iliac  . PERIPHERAL VASCULAR CATHETERIZATION N/A 10/06/2015   Procedure: Lower Extremity Angiography;  Surgeon: Adrian Prows, MD;  Location: [place] [facility];  Service: Cardiovascular;  Laterality: N/A;  . PERIPHERAL VASCULAR CATHETERIZATION  10/06/2015   Procedure: Peripheral Vascular Intervention;  Surgeon: Adrian Prows, MD;  Location: [place] [facility];  Service: Cardiovascular;;  REIA  Omnilink 6.0x29, innova 7x80  RSFA innova 5x80  . REVISON OF ARTERIOVENOUS FISTULA  06/23/2012   Procedure: REVISON OF ARTERIOVENOUS FISTULA;  Surgeon: Angelia Mould, MD;  Location: [place];  Service: Vascular;  Laterality: Right;  Ultrasound guided  . SHUNTOGRAM N/A 04/06/2012   Procedure: Earney Mallet;  Surgeon: Angelia Mould, MD;  Location: [facility];  Service: Cardiovascular;  Laterality: N/A;    Social History Social History  Substance Use Topics  . Smoking status: Never Smoker  . Smokeless tobacco: Never Used  . Alcohol use No    Family History Family History  Problem Relation Age of Onset  . Malignant hyperthermia Mother   . Thyroid disease Mother   . Hypertension Mother   . Malignant hyperthermia Father   . Hypertension Father   . Diabetes Brother   . Heart disease Brother        CHF  . Anesthesia problems Neg  Hx     No Known Allergies   REVIEW OF SYSTEMS(Negative unless checked)  Constitutional: [] Weight loss[] Fever[] Chills Cardiac:[] Chest pain[] Chest pressure[] Palpitations [] Shortness of breath when laying flat [] Shortness of breath at rest [] Shortness of breath with exertion. Vascular: [] Pain in legs with walking[] Pain in legsat rest[] Pain in legs when laying flat [x] Claudication [] Pain in feet when walking [] Pain in feet at rest [] Pain in feet when laying flat [] History of DVT [] Phlebitis [] Swelling in legs [] Varicose veins [] Non-healing ulcers Pulmonary: [] Uses home oxygen [] Productive cough[] Hemoptysis [] Wheeze [] COPD [] Asthma Neurologic: [] Dizziness [] Blackouts [] Seizures [] History of stroke [] History of TIA[] Aphasia [] Temporary blindness[] Dysphagia [] Weaknessor numbness in arms [] Weakness or numbnessin legs Musculoskeletal: [] Arthritis [] Joint swelling [x] Joint pain [] Low back pain Hematologic:[] Easy bruising[] Easy bleeding [] Hypercoagulable state [] Anemic  Gastrointestinal:[] Blood in stool[] Vomiting blood[] Gastroesophageal reflux/heartburn[] Abdominal pain Genitourinary: [x] Chronic kidney disease [] Difficulturination [] Frequenturination [] Burning with urination[] Hematuria Skin: [] Rashes [x] Ulcers [x] Wounds Psychological: [] History of anxiety[] History of major depression.   Physical Examination  BP 118/70 (BP Location: Left Arm)   Pulse 82   Resp 14   Ht 5' (1.524 m)   Wt 68 kg (150 lb)   BMI 29.29 kg/m  Gen:  WD/WN, NAD Head: [place]/AT, No temporalis wasting. Ear/Nose/Throat: Hearing grossly intact, nares w/o erythema or drainage, trachea midline Eyes: Conjunctiva clear. Sclera non-icteric Neck: Supple.  No JVD.  Pulmonary:  Good air movement, no use of accessory muscles.  Cardiac: RRR, normal S1, S2 Vascular: Good thrill in right arm AV fistula Vessel Right  Left  Radial  trace palpable Palpable                          PT  not palpable  not palpable  DP  not palpable  1+ palpable    Musculoskeletal: M/S 5/5 throughout.  No deformity or atrophy. Right BKA with prosthesis Neurologic: Sensation grossly intact in extremities.  Symmetrical.  Speech is fluent.  Psychiatric: Judgment intact, Mood & affect appropriate for pt's clinical situation. Dermatologic: No rashes or ulcers noted.  No cellulitis or open wounds.       Labs No results found for this or any previous visit (from the past 2160 hour(s)).  Radiology No results found.    Assessment/Plan History of renal transplant Currently working well.   S/P unilateral BKA (below knee amputation) ([place]) On the right. Well-healed. Increases her concern over her left lower extremity ischemic symptoms currently has an aunt dictation on that side would be disabling.  End stage renal disease Status  post renal transplant.  Type 2 diabetes mellitus with peripheral neuropathy (HCC) blood glucose control important in reducing the progression of atherosclerotic disease. Also, involved in wound healing. On appropriate medications.   HTN (hypertension), malignant blood pressure control important in reducing the progression of atherosclerotic disease. On appropriate oral medications.  Steal syndrome as complication of dialysis access [facility]) Her noninvasive studies today demonstrate no focal stenosis within the bypass or the AV fistula in the right upper extremity, but there are diminished flow in the right hand and digits that do improve somewhat with compression of the AV fistula. At current, she has some recurrent steal symptoms but they are pretty mild.  We discussed that we can ligate her fistula but as long as her symptoms are mild, I would like to leave it in place in case she needs it in the future if her kidney transplant fails.  I will plan to see her back in 6 months or sooner  if problems develop in the interim    Leotis Pain, MD  03/28/2017 10:00 AM    This note was created with Dragon medical transcription system.  Any errors from dictation are purely unintentional

## 2017-03-31 DIAGNOSIS — I10 Essential (primary) hypertension: Secondary | ICD-10-CM | POA: Diagnosis not present

## 2017-03-31 DIAGNOSIS — G903 Multi-system degeneration of the autonomic nervous system: Secondary | ICD-10-CM | POA: Diagnosis not present

## 2017-03-31 DIAGNOSIS — E1143 Type 2 diabetes mellitus with diabetic autonomic (poly)neuropathy: Secondary | ICD-10-CM | POA: Diagnosis not present

## 2017-03-31 DIAGNOSIS — I739 Peripheral vascular disease, unspecified: Secondary | ICD-10-CM | POA: Diagnosis not present

## 2017-04-02 ENCOUNTER — Telehealth (INDEPENDENT_AMBULATORY_CARE_PROVIDER_SITE_OTHER): Payer: Self-pay | Admitting: Orthopedic Surgery

## 2017-04-02 DIAGNOSIS — I10 Essential (primary) hypertension: Secondary | ICD-10-CM | POA: Diagnosis not present

## 2017-04-02 DIAGNOSIS — R809 Proteinuria, unspecified: Secondary | ICD-10-CM | POA: Diagnosis not present

## 2017-04-02 DIAGNOSIS — E7800 Pure hypercholesterolemia, unspecified: Secondary | ICD-10-CM | POA: Diagnosis not present

## 2017-04-02 DIAGNOSIS — E1165 Type 2 diabetes mellitus with hyperglycemia: Secondary | ICD-10-CM | POA: Diagnosis not present

## 2017-04-02 NOTE — Telephone Encounter (Signed)
03/10/2017 OV NOTE FAXED TO Alison Stalling [phone]

## 2017-05-02 ENCOUNTER — Other Ambulatory Visit: Payer: Self-pay | Admitting: Internal Medicine

## 2017-05-02 DIAGNOSIS — Z1231 Encounter for screening mammogram for malignant neoplasm of breast: Secondary | ICD-10-CM

## 2017-05-05 DIAGNOSIS — I10 Essential (primary) hypertension: Secondary | ICD-10-CM | POA: Diagnosis not present

## 2017-05-05 DIAGNOSIS — K219 Gastro-esophageal reflux disease without esophagitis: Secondary | ICD-10-CM | POA: Diagnosis not present

## 2017-05-05 DIAGNOSIS — E785 Hyperlipidemia, unspecified: Secondary | ICD-10-CM | POA: Diagnosis not present

## 2017-05-05 DIAGNOSIS — Z0000 Encounter for general adult medical examination without abnormal findings: Secondary | ICD-10-CM | POA: Diagnosis not present

## 2017-05-05 DIAGNOSIS — E119 Type 2 diabetes mellitus without complications: Secondary | ICD-10-CM | POA: Diagnosis not present

## 2017-05-12 DIAGNOSIS — E785 Hyperlipidemia, unspecified: Secondary | ICD-10-CM | POA: Diagnosis not present

## 2017-05-12 DIAGNOSIS — J0100 Acute maxillary sinusitis, unspecified: Secondary | ICD-10-CM | POA: Diagnosis not present

## 2017-05-12 DIAGNOSIS — E119 Type 2 diabetes mellitus without complications: Secondary | ICD-10-CM | POA: Diagnosis not present

## 2017-05-12 DIAGNOSIS — R05 Cough: Secondary | ICD-10-CM | POA: Diagnosis not present

## 2017-05-29 DIAGNOSIS — E119 Type 2 diabetes mellitus without complications: Secondary | ICD-10-CM | POA: Diagnosis not present

## 2017-05-29 DIAGNOSIS — K219 Gastro-esophageal reflux disease without esophagitis: Secondary | ICD-10-CM | POA: Diagnosis not present

## 2017-05-29 DIAGNOSIS — I10 Essential (primary) hypertension: Secondary | ICD-10-CM | POA: Diagnosis not present

## 2017-05-29 DIAGNOSIS — E785 Hyperlipidemia, unspecified: Secondary | ICD-10-CM | POA: Diagnosis not present

## 2017-06-12 ENCOUNTER — Ambulatory Visit
Admission: RE | Admit: 2017-06-12 | Discharge: 2017-06-12 | Disposition: A | Discharge status: Home / Self Care | Payer: Medicare Other | Source: Ambulatory Visit | Attending: Internal Medicine | Admitting: Internal Medicine

## 2017-06-12 DIAGNOSIS — Z1231 Encounter for screening mammogram for malignant neoplasm of breast: Secondary | ICD-10-CM | POA: Diagnosis not present

## 2017-06-24 DIAGNOSIS — Z89511 Acquired absence of right leg below knee: Secondary | ICD-10-CM | POA: Diagnosis not present

## 2017-06-24 DIAGNOSIS — M2042 Other hammer toe(s) (acquired), left foot: Secondary | ICD-10-CM | POA: Diagnosis not present

## 2017-06-24 DIAGNOSIS — I739 Peripheral vascular disease, unspecified: Secondary | ICD-10-CM | POA: Diagnosis not present

## 2017-06-26 DIAGNOSIS — J329 Chronic sinusitis, unspecified: Secondary | ICD-10-CM | POA: Diagnosis not present

## 2017-06-26 DIAGNOSIS — J328 Other chronic sinusitis: Secondary | ICD-10-CM | POA: Insufficient documentation

## 2017-07-03 DIAGNOSIS — I10 Essential (primary) hypertension: Secondary | ICD-10-CM | POA: Diagnosis not present

## 2017-07-03 DIAGNOSIS — N182 Chronic kidney disease, stage 2 (mild): Secondary | ICD-10-CM | POA: Diagnosis not present

## 2017-07-03 DIAGNOSIS — N2581 Secondary hyperparathyroidism of renal origin: Secondary | ICD-10-CM | POA: Diagnosis not present

## 2017-07-03 DIAGNOSIS — Z940 Kidney transplant status: Secondary | ICD-10-CM | POA: Diagnosis not present

## 2017-07-03 DIAGNOSIS — N189 Chronic kidney disease, unspecified: Secondary | ICD-10-CM | POA: Diagnosis not present

## 2017-07-03 DIAGNOSIS — E7800 Pure hypercholesterolemia, unspecified: Secondary | ICD-10-CM | POA: Diagnosis not present

## 2017-07-03 DIAGNOSIS — E1165 Type 2 diabetes mellitus with hyperglycemia: Secondary | ICD-10-CM | POA: Diagnosis not present

## 2017-07-03 DIAGNOSIS — R809 Proteinuria, unspecified: Secondary | ICD-10-CM | POA: Diagnosis not present

## 2017-07-03 DIAGNOSIS — D631 Anemia in chronic kidney disease: Secondary | ICD-10-CM | POA: Diagnosis not present

## 2017-07-03 DIAGNOSIS — I129 Hypertensive chronic kidney disease with stage 1 through stage 4 chronic kidney disease, or unspecified chronic kidney disease: Secondary | ICD-10-CM | POA: Diagnosis not present

## 2017-07-04 ENCOUNTER — Ambulatory Visit (INDEPENDENT_AMBULATORY_CARE_PROVIDER_SITE_OTHER): Payer: Medicare Other | Admitting: Vascular Surgery

## 2017-07-04 ENCOUNTER — Ambulatory Visit (INDEPENDENT_AMBULATORY_CARE_PROVIDER_SITE_OTHER): Payer: Medicare Other

## 2017-07-04 DIAGNOSIS — I739 Peripheral vascular disease, unspecified: Secondary | ICD-10-CM

## 2017-07-08 DIAGNOSIS — K219 Gastro-esophageal reflux disease without esophagitis: Secondary | ICD-10-CM | POA: Diagnosis not present

## 2017-07-08 DIAGNOSIS — I10 Essential (primary) hypertension: Secondary | ICD-10-CM | POA: Diagnosis not present

## 2017-07-08 DIAGNOSIS — E119 Type 2 diabetes mellitus without complications: Secondary | ICD-10-CM | POA: Diagnosis not present

## 2017-07-08 DIAGNOSIS — E785 Hyperlipidemia, unspecified: Secondary | ICD-10-CM | POA: Diagnosis not present

## 2017-07-15 ENCOUNTER — Encounter (INDEPENDENT_AMBULATORY_CARE_PROVIDER_SITE_OTHER): Payer: Self-pay

## 2017-07-15 DIAGNOSIS — Z89511 Acquired absence of right leg below knee: Secondary | ICD-10-CM | POA: Diagnosis not present

## 2017-08-08 DIAGNOSIS — L42 Pityriasis rosea: Secondary | ICD-10-CM | POA: Diagnosis not present

## 2017-08-13 DIAGNOSIS — K219 Gastro-esophageal reflux disease without esophagitis: Secondary | ICD-10-CM | POA: Diagnosis not present

## 2017-08-13 DIAGNOSIS — E119 Type 2 diabetes mellitus without complications: Secondary | ICD-10-CM | POA: Diagnosis not present

## 2017-08-13 DIAGNOSIS — I10 Essential (primary) hypertension: Secondary | ICD-10-CM | POA: Diagnosis not present

## 2017-08-13 DIAGNOSIS — J302 Other seasonal allergic rhinitis: Secondary | ICD-10-CM | POA: Diagnosis not present

## 2017-08-13 DIAGNOSIS — E785 Hyperlipidemia, unspecified: Secondary | ICD-10-CM | POA: Diagnosis not present

## 2017-09-10 DIAGNOSIS — E119 Type 2 diabetes mellitus without complications: Secondary | ICD-10-CM | POA: Diagnosis not present

## 2017-09-10 DIAGNOSIS — Z89511 Acquired absence of right leg below knee: Secondary | ICD-10-CM | POA: Diagnosis not present

## 2017-09-10 DIAGNOSIS — M2042 Other hammer toe(s) (acquired), left foot: Secondary | ICD-10-CM | POA: Diagnosis not present

## 2017-09-10 DIAGNOSIS — I739 Peripheral vascular disease, unspecified: Secondary | ICD-10-CM | POA: Diagnosis not present

## 2017-09-26 ENCOUNTER — Encounter (INDEPENDENT_AMBULATORY_CARE_PROVIDER_SITE_OTHER): Payer: Medicaid Other

## 2017-09-26 ENCOUNTER — Ambulatory Visit (INDEPENDENT_AMBULATORY_CARE_PROVIDER_SITE_OTHER): Payer: Medicaid Other | Admitting: Vascular Surgery

## 2017-10-22 DIAGNOSIS — G903 Multi-system degeneration of the autonomic nervous system: Secondary | ICD-10-CM | POA: Diagnosis not present

## 2017-10-22 DIAGNOSIS — I739 Peripheral vascular disease, unspecified: Secondary | ICD-10-CM | POA: Diagnosis not present

## 2017-10-22 DIAGNOSIS — E1143 Type 2 diabetes mellitus with diabetic autonomic (poly)neuropathy: Secondary | ICD-10-CM | POA: Diagnosis not present

## 2017-10-22 DIAGNOSIS — I10 Essential (primary) hypertension: Secondary | ICD-10-CM | POA: Diagnosis not present

## 2017-10-27 DIAGNOSIS — R809 Proteinuria, unspecified: Secondary | ICD-10-CM | POA: Diagnosis not present

## 2017-10-27 DIAGNOSIS — E7800 Pure hypercholesterolemia, unspecified: Secondary | ICD-10-CM | POA: Diagnosis not present

## 2017-10-27 DIAGNOSIS — I10 Essential (primary) hypertension: Secondary | ICD-10-CM | POA: Diagnosis not present

## 2017-10-27 DIAGNOSIS — E1165 Type 2 diabetes mellitus with hyperglycemia: Secondary | ICD-10-CM | POA: Diagnosis not present

## 2017-10-30 DIAGNOSIS — M7662 Achilles tendinitis, left leg: Secondary | ICD-10-CM | POA: Diagnosis not present

## 2017-10-30 DIAGNOSIS — M2042 Other hammer toe(s) (acquired), left foot: Secondary | ICD-10-CM | POA: Diagnosis not present

## 2017-10-30 DIAGNOSIS — Z89511 Acquired absence of right leg below knee: Secondary | ICD-10-CM | POA: Diagnosis not present

## 2017-10-30 DIAGNOSIS — M79675 Pain in left toe(s): Secondary | ICD-10-CM | POA: Diagnosis not present

## 2017-11-04 ENCOUNTER — Ambulatory Visit (INDEPENDENT_AMBULATORY_CARE_PROVIDER_SITE_OTHER): Payer: Medicare Other

## 2017-11-04 ENCOUNTER — Encounter (INDEPENDENT_AMBULATORY_CARE_PROVIDER_SITE_OTHER): Payer: Self-pay | Admitting: Vascular Surgery

## 2017-11-04 ENCOUNTER — Ambulatory Visit (INDEPENDENT_AMBULATORY_CARE_PROVIDER_SITE_OTHER): Payer: Medicare Other | Admitting: Vascular Surgery

## 2017-11-04 VITALS — BP 148/74 | HR 90 | Resp 15 | Ht 60.0 in | Wt 158.0 lb

## 2017-11-04 DIAGNOSIS — Z940 Kidney transplant status: Secondary | ICD-10-CM

## 2017-11-04 DIAGNOSIS — I10 Essential (primary) hypertension: Secondary | ICD-10-CM

## 2017-11-04 DIAGNOSIS — E785 Hyperlipidemia, unspecified: Secondary | ICD-10-CM | POA: Diagnosis not present

## 2017-11-04 DIAGNOSIS — Z89519 Acquired absence of unspecified leg below knee: Secondary | ICD-10-CM

## 2017-11-04 DIAGNOSIS — N189 Chronic kidney disease, unspecified: Secondary | ICD-10-CM | POA: Diagnosis not present

## 2017-11-04 DIAGNOSIS — N186 End stage renal disease: Secondary | ICD-10-CM | POA: Diagnosis not present

## 2017-11-04 DIAGNOSIS — T82898D Other specified complication of vascular prosthetic devices, implants and grafts, subsequent encounter: Secondary | ICD-10-CM

## 2017-11-04 DIAGNOSIS — N182 Chronic kidney disease, stage 2 (mild): Secondary | ICD-10-CM | POA: Diagnosis not present

## 2017-11-04 DIAGNOSIS — I739 Peripheral vascular disease, unspecified: Secondary | ICD-10-CM

## 2017-11-04 DIAGNOSIS — I129 Hypertensive chronic kidney disease with stage 1 through stage 4 chronic kidney disease, or unspecified chronic kidney disease: Secondary | ICD-10-CM | POA: Diagnosis not present

## 2017-11-04 DIAGNOSIS — N2581 Secondary hyperparathyroidism of renal origin: Secondary | ICD-10-CM | POA: Diagnosis not present

## 2017-11-04 DIAGNOSIS — D631 Anemia in chronic kidney disease: Secondary | ICD-10-CM | POA: Diagnosis not present

## 2017-11-04 DIAGNOSIS — R300 Dysuria: Secondary | ICD-10-CM | POA: Diagnosis not present

## 2017-11-04 NOTE — Assessment & Plan Note (Signed)
Duplex today shows no hemodynamically significant stenosis the right arm DRIL bypass with good waveforms distally.  The fistula has moderately elevated velocities in the perianastomotic region but no other obvious stenosis are identified. No worrisome symptoms currently.  No intervention would be recommended at this time.  I am going to extend her follow-up to an annual basis at this point as she seems to be doing quite well.

## 2017-11-04 NOTE — Progress Notes (Signed)
MRN : [id number]  [name] is a 47 y.o. (1971/05/08) female who presents with chief complaint of  Chief Complaint  Patient presents with  . Follow-up    6 month HDA  .  History of Present Illness: Patient returns today in follow up of her right arm AV fistula and DRIL bypass on the right arm.  Her right arm is currently doing well without any major ischemic symptoms.  No significant pain or ulceration.  She has not used her fistula in some time since she got her kidney transplant a while back.  Duplex today shows no hemodynamically significant stenosis the right arm DRIL bypass with good waveforms distally.  The fistula has moderately elevated velocities in the perianastomotic region but no other obvious stenosis are identified.  Current Outpatient Medications  Medication Sig Dispense Refill  . albuterol (PROVENTIL HFA;VENTOLIN HFA) 108 (90 BASE) MCG/ACT inhaler Inhale 1 puff into the lungs every 6 (six) hours as needed for wheezing or shortness of breath.    Marland Kitchen amLODipine (NORVASC) 10 MG tablet Take 10 mg by mouth at bedtime.     . betamethasone dipropionate (DIPROLENE) 0.05 % cream   3  . fluticasone (FLONASE) 50 MCG/ACT nasal spray 2 sprays by Each Nare route daily for 30 days.    . furosemide (LASIX) 40 MG tablet     . insulin aspart (NOVOLOG) 100 UNIT/ML injection Inject 4-14 Units into the skin 3 (three) times daily before meals. Per sliding scale: 100-150, 4 units. 151-200, 6 units. 201-250, 8 units. 251-300, 10 units. 301-350, 12 units. Over 350, 14 units.    . insulin detemir (LEVEMIR) 100 UNIT/ML injection Inject 0.06 mLs (6 Units total) into the skin at bedtime. 10 mL 0  . isosorbide-hydrALAZINE (BIDIL) 20-37.5 MG tablet Take by mouth.    . mycophenolate (MYFORTIC) 180 MG EC tablet Take 360-540 mg by mouth 2 (two) times daily. Take three capsules (540mg ) in the morning and two capsules (360mg ) at night.    . nitrofurantoin, macrocrystal-monohydrate, (MACROBID) 100 MG  capsule     . pantoprazole (PROTONIX) 40 MG tablet Take 40 mg by mouth at bedtime.     . pravastatin (PRAVACHOL) 20 MG tablet Take 20 mg by mouth at bedtime.     . sulfamethoxazole-trimethoprim (BACTRIM,SEPTRA) 400-80 MG per tablet Take 1 tablet by mouth every Monday, Wednesday, and Friday.     . tacrolimus (PROGRAF) 1 MG capsule Take 2 mg by mouth 2 (two) times daily.     Marland Kitchen labetalol (NORMODYNE) 300 MG tablet Take 300 mg by mouth at bedtime.   3  . Vitamin D, Ergocalciferol, (DRISDOL) 50000 units CAPS capsule Take 50,000 Units by mouth every Sunday.      Current Facility-Administered Medications  Medication Dose Route Frequency Provider Last Rate Last Dose  . 0.9 %  sodium chloride infusion   Intravenous Continuous Dew, Erskine Squibb, MD      . ceFAZolin (ANCEF) IVPB 1 g/50 mL premix  1 g Intravenous Once Algernon Huxley, MD        Past Medical History:  Diagnosis Date  . Anemia   . CHF (congestive heart failure) ([place])   . Critical ischemia of lower extremity hospitalized 10/05/2015   right  . Diabetic neuropathy ([place])   . ESRD (end stage renal disease) on dialysis ([place]) 09/28/2011-01/23/2013   M-W-F; [place]  . GERD (gastroesophageal reflux disease)   . High cholesterol   . History of blood transfusion  related to "kidney transplant"  . Hypertension    sees Dr. Carolin Guernsey  . Metabolic bone disease   . PAD (peripheral artery disease) ([place])   . Peripheral vascular disease ([place])   . Pneumonia 09/27/2011  . Retinopathy   . Type II diabetes mellitus ([place])    "controlled with diet"    Past Surgical History:  Procedure Laterality Date  . ABOVE KNEE LEG AMPUTATION Right   . AMPUTATION Right 10/13/2015   Procedure: Right Transmetatarsal Amputation;  Surgeon: Newt Minion, MD;  Location: [place];  Service: Orthopedics;  Laterality: Right;  . AMPUTATION Right 11/29/2015   Procedure: RIGHT BELOW KNEE AMPUTATION;  Surgeon: Newt Minion, MD;  Location: [place];  Service: Orthopedics;   Laterality: Right;  . AMPUTATION FINGER     Rt hand middle finger  . AV FISTULA PLACEMENT  10/04/2011   Procedure: INSERTION OF ARTERIOVENOUS (AV) GORE-TEX GRAFT ARM;  Surgeon: Angelia Mould, MD;  Location: [place];  Service: Vascular;  Laterality: Left;  Insertion left upper arm Arteriovenous goretex graft  . AV FISTULA PLACEMENT  10/29/2011   Procedure: ARTERIOVENOUS (AV) FISTULA CREATION;  Surgeon: Angelia Mould, MD;  Location[facility];  Service: Vascular;  Laterality: Right;  Creation Right Arteriovenous Fistula   . [place] REMOVAL  10/04/2011   Procedure: REMOVAL OF ARTERIOVENOUS GORETEX GRAFT ([place]);  Surgeon: Elam Dutch, MD;  Location: [place];  Service: Vascular;  Laterality: Left;  . CHOLECYSTECTOMY N/A 12/08/2015   Procedure: LAPAROSCOPIC CHOLECYSTECTOMY WITH INTRAOPERATIVE CHOLANGIOGRAM;  Surgeon: Stark Klein, MD;  Location: [place];  Service: General;  Laterality: N/A;  . ESOPHAGOGASTRODUODENOSCOPY N/A 12/24/2012   Procedure: ESOPHAGOGASTRODUODENOSCOPY (EGD);  Surgeon: Milus Banister, MD;  Location: [place];  Service: Endoscopy;  Laterality: N/A;  . FINGER AMPUTATION Right 02/26/2013   "3rd finger"  . INSERTION OF DIALYSIS CATHETER  10/04/2011   Procedure: INSERTION OF DIALYSIS CATHETER;  Surgeon: Angelia Mould, MD;  Location: [place];  Service: Vascular;  Laterality: Right;  insertion of dialysis catheter right internal jugular  . INSERTION OF DIALYSIS CATHETER  06/23/2012   Procedure: INSERTION OF DIALYSIS CATHETER;  Surgeon: Angelia Mould, MD;  Location: [place];  Service: Vascular;  Laterality: N/A;  Ultrasound guided  . KIDNEY TRANSPLANT  01/24/2013  . LOWER EXTREMITY ANGIOGRAPHY Left 08/12/2016   Procedure: Lower Extremity Angiography;  Surgeon: Algernon Huxley, MD;  Location: [place] [facility];  Service: Cardiovascular;  Laterality: Left;  . LOWER EXTREMITY INTERVENTION  08/12/2016   Procedure: Lower Extremity Intervention;  Surgeon: Algernon Huxley, MD;   Location: [place] [facility];  Service: Cardiovascular;;  . PATCH ANGIOPLASTY  06/23/2012   Procedure: PATCH ANGIOPLASTY;  Surgeon: Angelia Mould, MD;  Location: [place];  Service: Vascular;  Laterality: Right;  . PERIPHERAL VASCULAR CATHETERIZATION N/A 09/19/2015   Procedure: Lower Extremity Angiography;  Surgeon: Adrian Prows, MD;  Location: [place] [facility];  Service: Cardiovascular;  Laterality: N/A;  . PERIPHERAL VASCULAR CATHETERIZATION N/A 09/19/2015   Procedure: Abdominal Aortogram;  Surgeon: Adrian Prows, MD;  Location: [place] [facility];  Service: Cardiovascular;  Laterality: N/A;  . PERIPHERAL VASCULAR CATHETERIZATION Right 09/19/2015   Procedure: Peripheral Vascular Intervention;  Surgeon: Adrian Prows, MD;  Location: [place] [facility] LAB;  Service: Cardiovascular;  Laterality: Right;  Right Common  Iliac  . PERIPHERAL VASCULAR CATHETERIZATION N/A 10/06/2015   Procedure: Lower Extremity Angiography;  Surgeon: Adrian Prows, MD;  Location: [place] [facility];  Service:  Cardiovascular;  Laterality: N/A;  . PERIPHERAL VASCULAR CATHETERIZATION  10/06/2015   Procedure: Peripheral Vascular Intervention;  Surgeon: Adrian Prows, MD;  Location: [place] [facility] LAB;  Service: Cardiovascular;;  REIA  Omnilink 6.0x29, innova 7x80  RSFA innova 5x80  . REVISON OF ARTERIOVENOUS FISTULA  06/23/2012   Procedure: REVISON OF ARTERIOVENOUS FISTULA;  Surgeon: Angelia Mould, MD;  Location: [place];  Service: Vascular;  Laterality: Right;  Ultrasound guided  . SHUNTOGRAM N/A 04/06/2012   Procedure: Earney Mallet;  Surgeon: Angelia Mould, MD;  Location: [facility] CATH LAB;  Service: Cardiovascular;  Laterality: N/A;   Social History     Social History  Substance Use Topics  . Smoking status: Never Smoker  . Smokeless tobacco: Never Used  . Alcohol use No    Family History      Family History  Problem Relation Age of Onset  . Malignant hyperthermia Mother   . Thyroid disease Mother   . Hypertension Mother     . Malignant hyperthermia Father   . Hypertension Father   . Diabetes Brother   . Heart disease Brother        CHF  . Anesthesia problems Neg Hx     No Known Allergies   REVIEW OF SYSTEMS(Negative unless checked)  Constitutional: [] Weight loss[] Fever[] Chills Cardiac:[] Chest pain[] Chest pressure[] Palpitations [] Shortness of breath when laying flat [] Shortness of breath at rest [] Shortness of breath with exertion. Vascular: [] Pain in legs with walking[] Pain in legsat rest[] Pain in legs when laying flat [x] Claudication [] Pain in feet when walking [] Pain in feet at rest [] Pain in feet when laying flat [] History of DVT [] Phlebitis [] Swelling in legs [] Varicose veins [] Non-healing ulcers Pulmonary: [] Uses home oxygen [] Productive cough[] Hemoptysis [] Wheeze [] COPD [] Asthma Neurologic: [] Dizziness [] Blackouts [] Seizures [] History of stroke [] History of TIA[] Aphasia [] Temporary blindness[] Dysphagia [] Weaknessor numbness in arms [] Weakness or numbnessin legs Musculoskeletal: [] Arthritis [] Joint swelling [x] Joint pain [] Low back pain Hematologic:[] Easy bruising[] Easy bleeding [] Hypercoagulable state [] Anemic  Gastrointestinal:[] Blood in stool[] Vomiting blood[] Gastroesophageal reflux/heartburn[] Abdominal pain Genitourinary: [x] Chronic kidney disease [] Difficulturination [] Frequenturination [] Burning with urination[] Hematuria Skin: [] Rashes [x] Ulcers [x] Wounds Psychological: [] History of anxiety[] History of major depression.    Physical Examination  BP (!) 148/74 (BP Location: Left Arm, Patient Position: Sitting)   Pulse 90   Resp 15   Ht 5' (1.524 m)   Wt 158 lb (71.7 kg)   BMI 30.86 kg/m  Gen:  WD/WN, NAD Head: [place]/AT, No temporalis wasting. Ear/Nose/Throat: Hearing grossly intact, nares w/o erythema or drainage Eyes: Conjunctiva clear. Sclera  non-icteric Neck: Supple.  Trachea midline Pulmonary:  Good air movement, no use of accessory muscles.  Cardiac: RRR, no JVD Vascular: Good thrill in right arm AV fistula. Vessel Right Left  Radial Palpable Palpable                                    Musculoskeletal: M/S 5/5 throughout.  Right BKA prosthesis.  Missing the third finger on her right hand but well-healed.  No significant edema. Neurologic: Sensation grossly intact in extremities.  Symmetrical.  Speech is fluent.  Psychiatric: Judgment intact, Mood & affect appropriate for pt's clinical situation. Dermatologic: No rashes or ulcers noted.  No cellulitis or open wounds.       Labs No results found for this or any previous visit (from the past 2160 hour(s)).  Radiology No results found.  Assessment/Plan History of renal transplant Currently working well.   S/P unilateral BKA (below knee amputation) ([place]) On the right. Well-healed. Increases  her concern over her left lower extremity ischemic symptoms.  End stage renal disease Status post renal transplant.  Type 2 diabetes mellitus with peripheral neuropathy (HCC) blood glucose control important in reducing the progression of atherosclerotic disease. Also, involved in wound healing. On appropriate medications.   HTN (hypertension), malignant blood pressure control important in reducing the progression of atherosclerotic disease. On appropriate oral medications.   Steal syndrome as complication of dialysis access [facility]) Duplex today shows no hemodynamically significant stenosis the right arm DRIL bypass with good waveforms distally.  The fistula has moderately elevated velocities in the perianastomotic region but no other obvious stenosis are identified. No worrisome symptoms currently.  No intervention would be recommended at this time.  I am going to extend her follow-up to an annual basis at this point as she seems to be doing quite  well.    Leotis Pain, MD  11/04/2017 3:45 PM    This note was created with Dragon medical transcription system.  Any errors from dictation are purely unintentional

## 2017-11-28 DIAGNOSIS — E1143 Type 2 diabetes mellitus with diabetic autonomic (poly)neuropathy: Secondary | ICD-10-CM | POA: Diagnosis not present

## 2017-11-28 DIAGNOSIS — I10 Essential (primary) hypertension: Secondary | ICD-10-CM | POA: Diagnosis not present

## 2017-11-28 DIAGNOSIS — G903 Multi-system degeneration of the autonomic nervous system: Secondary | ICD-10-CM | POA: Diagnosis not present

## 2017-11-28 DIAGNOSIS — I739 Peripheral vascular disease, unspecified: Secondary | ICD-10-CM | POA: Diagnosis not present

## 2017-12-16 DIAGNOSIS — I739 Peripheral vascular disease, unspecified: Secondary | ICD-10-CM | POA: Diagnosis not present

## 2017-12-16 DIAGNOSIS — E1143 Type 2 diabetes mellitus with diabetic autonomic (poly)neuropathy: Secondary | ICD-10-CM | POA: Diagnosis not present

## 2017-12-16 DIAGNOSIS — I10 Essential (primary) hypertension: Secondary | ICD-10-CM | POA: Diagnosis not present

## 2017-12-16 DIAGNOSIS — G903 Multi-system degeneration of the autonomic nervous system: Secondary | ICD-10-CM | POA: Diagnosis not present

## 2017-12-24 DIAGNOSIS — I10 Essential (primary) hypertension: Secondary | ICD-10-CM | POA: Diagnosis not present

## 2017-12-24 DIAGNOSIS — Z0100 Encounter for examination of eyes and vision without abnormal findings: Secondary | ICD-10-CM | POA: Diagnosis not present

## 2017-12-24 DIAGNOSIS — E785 Hyperlipidemia, unspecified: Secondary | ICD-10-CM | POA: Diagnosis not present

## 2017-12-24 DIAGNOSIS — Z0110 Encounter for examination of ears and hearing without abnormal findings: Secondary | ICD-10-CM | POA: Diagnosis not present

## 2017-12-24 DIAGNOSIS — Z0000 Encounter for general adult medical examination without abnormal findings: Secondary | ICD-10-CM | POA: Diagnosis not present

## 2017-12-24 DIAGNOSIS — Z87448 Personal history of other diseases of urinary system: Secondary | ICD-10-CM | POA: Diagnosis not present

## 2017-12-24 DIAGNOSIS — Z0001 Encounter for general adult medical examination with abnormal findings: Secondary | ICD-10-CM | POA: Diagnosis not present

## 2017-12-24 DIAGNOSIS — Z136 Encounter for screening for cardiovascular disorders: Secondary | ICD-10-CM | POA: Diagnosis not present

## 2017-12-24 DIAGNOSIS — E119 Type 2 diabetes mellitus without complications: Secondary | ICD-10-CM | POA: Diagnosis not present

## 2017-12-24 DIAGNOSIS — K219 Gastro-esophageal reflux disease without esophagitis: Secondary | ICD-10-CM | POA: Diagnosis not present

## 2018-01-02 ENCOUNTER — Encounter (INDEPENDENT_AMBULATORY_CARE_PROVIDER_SITE_OTHER): Payer: Medicare Other

## 2018-01-02 ENCOUNTER — Ambulatory Visit (INDEPENDENT_AMBULATORY_CARE_PROVIDER_SITE_OTHER): Payer: Medicare Other | Admitting: Vascular Surgery

## 2018-01-20 ENCOUNTER — Ambulatory Visit (INDEPENDENT_AMBULATORY_CARE_PROVIDER_SITE_OTHER): Payer: Medicare Other | Admitting: Vascular Surgery

## 2018-01-20 ENCOUNTER — Other Ambulatory Visit (INDEPENDENT_AMBULATORY_CARE_PROVIDER_SITE_OTHER): Payer: Self-pay | Admitting: Vascular Surgery

## 2018-01-20 ENCOUNTER — Encounter (INDEPENDENT_AMBULATORY_CARE_PROVIDER_SITE_OTHER): Payer: Medicaid Other

## 2018-01-20 DIAGNOSIS — I739 Peripheral vascular disease, unspecified: Secondary | ICD-10-CM

## 2018-01-27 DIAGNOSIS — D8989 Other specified disorders involving the immune mechanism, not elsewhere classified: Secondary | ICD-10-CM | POA: Diagnosis not present

## 2018-01-27 DIAGNOSIS — R600 Localized edema: Secondary | ICD-10-CM | POA: Diagnosis not present

## 2018-01-27 DIAGNOSIS — Z794 Long term (current) use of insulin: Secondary | ICD-10-CM | POA: Diagnosis not present

## 2018-01-27 DIAGNOSIS — Z4822 Encounter for aftercare following kidney transplant: Secondary | ICD-10-CM | POA: Diagnosis not present

## 2018-01-27 DIAGNOSIS — E119 Type 2 diabetes mellitus without complications: Secondary | ICD-10-CM | POA: Diagnosis not present

## 2018-01-27 DIAGNOSIS — I10 Essential (primary) hypertension: Secondary | ICD-10-CM | POA: Diagnosis not present

## 2018-01-27 DIAGNOSIS — E785 Hyperlipidemia, unspecified: Secondary | ICD-10-CM | POA: Diagnosis not present

## 2018-01-27 DIAGNOSIS — Z79899 Other long term (current) drug therapy: Secondary | ICD-10-CM | POA: Diagnosis not present

## 2018-01-27 DIAGNOSIS — Z940 Kidney transplant status: Secondary | ICD-10-CM | POA: Diagnosis not present

## 2018-01-27 DIAGNOSIS — E871 Hypo-osmolality and hyponatremia: Secondary | ICD-10-CM | POA: Diagnosis not present

## 2018-02-23 DIAGNOSIS — J302 Other seasonal allergic rhinitis: Secondary | ICD-10-CM | POA: Diagnosis not present

## 2018-02-23 DIAGNOSIS — E119 Type 2 diabetes mellitus without complications: Secondary | ICD-10-CM | POA: Diagnosis not present

## 2018-02-23 DIAGNOSIS — N3001 Acute cystitis with hematuria: Secondary | ICD-10-CM | POA: Diagnosis not present

## 2018-02-23 DIAGNOSIS — K219 Gastro-esophageal reflux disease without esophagitis: Secondary | ICD-10-CM | POA: Diagnosis not present

## 2018-02-23 DIAGNOSIS — E785 Hyperlipidemia, unspecified: Secondary | ICD-10-CM | POA: Diagnosis not present

## 2018-02-23 DIAGNOSIS — I10 Essential (primary) hypertension: Secondary | ICD-10-CM | POA: Diagnosis not present

## 2018-02-27 DIAGNOSIS — N2581 Secondary hyperparathyroidism of renal origin: Secondary | ICD-10-CM | POA: Diagnosis not present

## 2018-02-27 DIAGNOSIS — Z940 Kidney transplant status: Secondary | ICD-10-CM | POA: Diagnosis not present

## 2018-02-27 DIAGNOSIS — N189 Chronic kidney disease, unspecified: Secondary | ICD-10-CM | POA: Diagnosis not present

## 2018-03-02 DIAGNOSIS — Z940 Kidney transplant status: Secondary | ICD-10-CM | POA: Diagnosis not present

## 2018-03-02 DIAGNOSIS — I129 Hypertensive chronic kidney disease with stage 1 through stage 4 chronic kidney disease, or unspecified chronic kidney disease: Secondary | ICD-10-CM | POA: Diagnosis not present

## 2018-03-02 DIAGNOSIS — D631 Anemia in chronic kidney disease: Secondary | ICD-10-CM | POA: Diagnosis not present

## 2018-03-02 DIAGNOSIS — N2581 Secondary hyperparathyroidism of renal origin: Secondary | ICD-10-CM | POA: Diagnosis not present

## 2018-03-02 DIAGNOSIS — N182 Chronic kidney disease, stage 2 (mild): Secondary | ICD-10-CM | POA: Diagnosis not present

## 2018-03-05 DIAGNOSIS — I739 Peripheral vascular disease, unspecified: Secondary | ICD-10-CM | POA: Diagnosis not present

## 2018-03-05 DIAGNOSIS — M24572 Contracture, left ankle: Secondary | ICD-10-CM | POA: Diagnosis not present

## 2018-03-05 DIAGNOSIS — M2042 Other hammer toe(s) (acquired), left foot: Secondary | ICD-10-CM | POA: Diagnosis not present

## 2018-03-11 DIAGNOSIS — E7800 Pure hypercholesterolemia, unspecified: Secondary | ICD-10-CM | POA: Diagnosis not present

## 2018-03-11 DIAGNOSIS — R809 Proteinuria, unspecified: Secondary | ICD-10-CM | POA: Diagnosis not present

## 2018-03-11 DIAGNOSIS — E1165 Type 2 diabetes mellitus with hyperglycemia: Secondary | ICD-10-CM | POA: Diagnosis not present

## 2018-03-11 DIAGNOSIS — I10 Essential (primary) hypertension: Secondary | ICD-10-CM | POA: Diagnosis not present

## 2018-03-18 DIAGNOSIS — K219 Gastro-esophageal reflux disease without esophagitis: Secondary | ICD-10-CM | POA: Diagnosis not present

## 2018-03-18 DIAGNOSIS — E785 Hyperlipidemia, unspecified: Secondary | ICD-10-CM | POA: Diagnosis not present

## 2018-03-18 DIAGNOSIS — I10 Essential (primary) hypertension: Secondary | ICD-10-CM | POA: Diagnosis not present

## 2018-03-18 DIAGNOSIS — E119 Type 2 diabetes mellitus without complications: Secondary | ICD-10-CM | POA: Diagnosis not present

## 2018-03-18 DIAGNOSIS — N3000 Acute cystitis without hematuria: Secondary | ICD-10-CM | POA: Diagnosis not present

## 2018-03-18 DIAGNOSIS — J302 Other seasonal allergic rhinitis: Secondary | ICD-10-CM | POA: Diagnosis not present

## 2018-03-19 DIAGNOSIS — T300 Burn of unspecified body region, unspecified degree: Secondary | ICD-10-CM | POA: Diagnosis not present

## 2018-03-26 DIAGNOSIS — M2042 Other hammer toe(s) (acquired), left foot: Secondary | ICD-10-CM | POA: Diagnosis not present

## 2018-03-26 DIAGNOSIS — T25212A Burn of second degree of left ankle, initial encounter: Secondary | ICD-10-CM | POA: Diagnosis not present

## 2018-03-30 DIAGNOSIS — E119 Type 2 diabetes mellitus without complications: Secondary | ICD-10-CM | POA: Diagnosis not present

## 2018-03-30 DIAGNOSIS — J302 Other seasonal allergic rhinitis: Secondary | ICD-10-CM | POA: Diagnosis not present

## 2018-03-30 DIAGNOSIS — K219 Gastro-esophageal reflux disease without esophagitis: Secondary | ICD-10-CM | POA: Diagnosis not present

## 2018-03-30 DIAGNOSIS — I10 Essential (primary) hypertension: Secondary | ICD-10-CM | POA: Diagnosis not present

## 2018-03-30 DIAGNOSIS — E785 Hyperlipidemia, unspecified: Secondary | ICD-10-CM | POA: Diagnosis not present

## 2018-04-09 IMAGING — NM NM PULMONARY VENT & PERF
16 series · 16 of 16 positions shown · non-contrast
Comparison: 10/14/2015 chest radiograph

CLINICAL DATA: Recent lower extremity amputation, progressive
postoperative shortness of breath and hypoxia this morning

EXAM:
NUCLEAR MEDICINE VENTILATION - PERFUSION LUNG SCAN
TECHNIQUE: Ventilation images were obtained in multiple projections using
inhaled aerosol Sc-OOm DTPA. Perfusion images were obtained in
multiple projections after intravenous injection of Sc-OOm MAA.
RADIOPHARMACEUTICALS:  30.2 mCi Mechnetium-DDm DTPA aerosol
inhalation and 4.1 mCi Mechnetium-DDm MAA IV

[Series 1: ant/post vent · 4.14mm/px · 1 of 1 slices shown (1 of 2)]
[im 1/1  full-range]
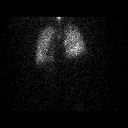

[Series 1: ant/post vent · 4.14mm/px · 1 of 1 slices shown (2 of 2)]
[im 1/1]
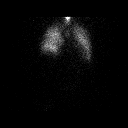

[Series 2: lao/rpo vent · 4.14mm/px · 1 of 1 slices shown (1 of 2)]
[im 1/1  full-range]
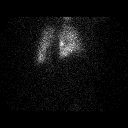

[Series 2: lao/rpo vent · 4.14mm/px · 1 of 1 slices shown (2 of 2)]
[im 1/1]
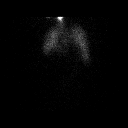

[Series 3: lpo/rao vent · 4.14mm/px · 1 of 1 slices shown (1 of 2)]
[im 1/1  full-range]
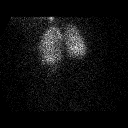

[Series 3: lpo/rao vent · 4.14mm/px · 1 of 1 slices shown (2 of 2)]
[im 1/1]
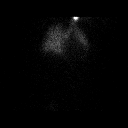

[Series 4: lt lat/rt lat vent · 4.14mm/px · 1 of 1 slices shown (1 of 2)]
[im 1/1  full-range]
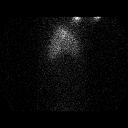

[Series 4: lt lat/rt lat vent · 4.14mm/px · 1 of 1 slices shown (2 of 2)]
[im 1/1  full-range]
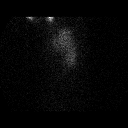

[Series 5: lt lat/rt lat perf · 4.14mm/px · 1 of 1 slices shown (1 of 2)]
[im 1/1]
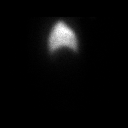

[Series 5: lt lat/rt lat perf · 4.14mm/px · 1 of 1 slices shown (2 of 2)]
[im 1/1]
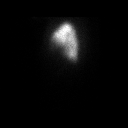

[Series 6: lpo/rao perf · 4.14mm/px · 1 of 1 slices shown (1 of 2)]
[im 1/1]
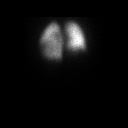

[Series 6: lpo/rao perf · 4.14mm/px · 1 of 1 slices shown (2 of 2)]
[im 1/1]
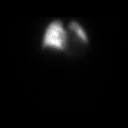

[Series 7: ant/post perf · 4.14mm/px · 1 of 1 slices shown (1 of 2)]
[im 1/1]
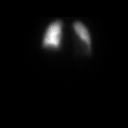

[Series 7: ant/post perf · 4.14mm/px · 1 of 1 slices shown (2 of 2)]
[im 1/1]
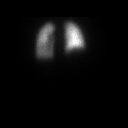

[Series 8: lao/rpo perf · 4.14mm/px · 1 of 1 slices shown (1 of 2)]
[im 1/1]
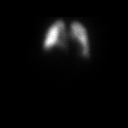

[Series 8: lao/rpo perf · 4.14mm/px · 1 of 1 slices shown (2 of 2)]
[im 1/1]
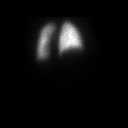

[16 of 16 positions shown; findings below may reference images not displayed]

FINDINGS: Ventilation: Ventilation defect lateral right mid lung zone matching
the perfusion defect described below.

Perfusion: Subtle peripheral perfusion defect laterally in the right
mid lung zone which does not correspond to a particular segment.
There is a matched ventilation defect with no radiographic
abnormality in this area. No other defects.
IMPRESSION: Low probability V/Q scan.

## 2018-04-16 DIAGNOSIS — T25212A Burn of second degree of left ankle, initial encounter: Secondary | ICD-10-CM | POA: Diagnosis not present

## 2018-04-30 DIAGNOSIS — I10 Essential (primary) hypertension: Secondary | ICD-10-CM | POA: Diagnosis not present

## 2018-04-30 DIAGNOSIS — E7800 Pure hypercholesterolemia, unspecified: Secondary | ICD-10-CM | POA: Diagnosis not present

## 2018-04-30 DIAGNOSIS — E1165 Type 2 diabetes mellitus with hyperglycemia: Secondary | ICD-10-CM | POA: Diagnosis not present

## 2018-05-18 DIAGNOSIS — E119 Type 2 diabetes mellitus without complications: Secondary | ICD-10-CM | POA: Diagnosis not present

## 2018-05-18 DIAGNOSIS — E785 Hyperlipidemia, unspecified: Secondary | ICD-10-CM | POA: Diagnosis not present

## 2018-05-18 DIAGNOSIS — I10 Essential (primary) hypertension: Secondary | ICD-10-CM | POA: Diagnosis not present

## 2018-05-18 DIAGNOSIS — K219 Gastro-esophageal reflux disease without esophagitis: Secondary | ICD-10-CM | POA: Diagnosis not present

## 2018-05-18 DIAGNOSIS — Z0001 Encounter for general adult medical examination with abnormal findings: Secondary | ICD-10-CM | POA: Diagnosis not present

## 2018-05-19 ENCOUNTER — Ambulatory Visit (INDEPENDENT_AMBULATORY_CARE_PROVIDER_SITE_OTHER): Payer: Medicare Other | Admitting: Physician Assistant

## 2018-05-19 ENCOUNTER — Encounter (INDEPENDENT_AMBULATORY_CARE_PROVIDER_SITE_OTHER): Payer: Self-pay | Admitting: Orthopedic Surgery

## 2018-05-19 VITALS — Ht 60.0 in | Wt 158.0 lb

## 2018-05-19 DIAGNOSIS — Z89511 Acquired absence of right leg below knee: Secondary | ICD-10-CM

## 2018-05-19 NOTE — Progress Notes (Signed)
Office Visit Note   Patient: [name]           Date of Birth: 12-Oct-1970           MRN: [id number] Visit Date: 05/19/2018              Requested by: Benito Mccreedy, MD [address], [place] [address] PCP: Benito Mccreedy, MD  Chief Complaint  Patient presents with  . Right Leg - Follow-up    Rx for scooter  . Left Leg - Follow-up      HPI: The patient is a 47 year old female who is status post a right transtibial amputation on 11/29/2015.  She has been fitted with a prosthesis biotech is having no issues with her prosthetic.  She does report that if she has to ambulate for more than a couple of hours she is tiring very quickly or if she has to walk longer distances she tires very quickly.  She would like to be more mobile for longer periods of time and would like to obtain a motorized scooter if possible.  Assessment & Plan: Visit Diagnoses:  1. Acquired absence of right leg below knee [facility])     Plan: It is felt the patient would benefit from a motorized scooter and a prescription was written for the patient to obtain a motorized scooter for long distance ambulation as the patient does fatigue after she is been up walking for more than a couple of hours or has to ambulate long distances on multiple unlevel surfaces.  She will follow-up here annually, or sooner if she has any difficulties in the interim.  Follow-Up Instructions: Return in about 1 year (around 05/20/2019).   Ortho Exam  Patient is alert, oriented, no adenopathy, well-dressed, normal affect, normal respiratory effort. The patient is ambulating with her right transtibial prosthesis without an assistive device.  The right transtibial amputation is well-healed and without evidence for breakdown or ulcers.  Imaging: No results found. No images are attached to the encounter.  Labs: Lab Results  Component Value Date   HGBA1C 8.3 (H) 10/13/2015   HGBA1C 6.3 (H) 12/20/2012   HGBA1C 6.1  (H) 09/27/2011   REPTSTATUS 12/09/2015 FINAL 12/06/2015   CULT 40,000 COLONIES/mL ESCHERICHIA COLI (A) 12/06/2015   LABORGA ESCHERICHIA COLI (A) 12/06/2015     Lab Results  Component Value Date   ALBUMIN 1.8 (L) 12/10/2015   ALBUMIN 1.8 (L) 12/09/2015   ALBUMIN 2.1 (L) 12/08/2015    Body mass index is 30.86 kg/m.  Orders:  No orders of the defined types were placed in this encounter.  No orders of the defined types were placed in this encounter.    Procedures: No procedures performed  Clinical Data: No additional findings.  ROS:  All other systems negative, except as noted in the HPI. Review of Systems  Objective: Vital Signs: Ht 5' (1.524 m)   Wt 158 lb (71.7 kg)   BMI 30.86 kg/m   Specialty Comments:  No specialty comments available.  PMFS History: Patient Active Problem List   Diagnosis Date Noted  . Steal syndrome as complication of dialysis access ([place]) 03/28/2017  . Transaminitis 12/06/2015  . Acute on chronic renal failure ([place]) 12/06/2015  . History of renal transplant 12/06/2015  . UTI (lower urinary tract infection) 12/06/2015  . Acute cholecystitis 12/06/2015  . Below knee amputation status 11/29/2015  . Asystole ([place])   . S/P unilateral BKA (below knee amputation) ([place])   . Pericardial  effusion   . Type 2 diabetes mellitus with peripheral neuropathy (HCC)   . Chronic kidney disease, stage IV (severe) ([place])   . Essential hypertension   . Anemia of chronic disease   . Acute blood loss anemia   . Gangrene associated with diabetes mellitus ([place]) 10/13/2015  . Critical lower limb ischemia 10/05/2015  . PAD (peripheral artery disease) ([place]) 09/18/2015  . Leukopenia 08/22/2014  . Protein-calorie malnutrition, severe ([place]) 12/24/2012  . Gastric ulcer 12/24/2012  . Mechanical complication of other vascular device, implant, and graft 09/22/2012  . End stage renal disease ([place]) 10/23/2011  . Physical deconditioning 10/09/2011  . HTN  (hypertension), malignant 09/27/2011  . Chronic kidney disease, stage 4, severely decreased GFR (HCC) 09/27/2011  . PNA (pneumonia) 09/27/2011  . Acute respiratory failure with hypoxia ([place]) 09/27/2011  . VOMITING 12/06/2009  . GOUT 12/21/2008  . Uncontrolled type 2 diabetes mellitus with peripheral artery disease ([place]) 08/14/2006  . Hyperlipidemia 08/14/2006  . OBESITY, NOS 08/14/2006  . FIBROADENOSIS, BREAST 08/14/2006  . Irregular menstrual cycle 08/14/2006   Past Medical History:  Diagnosis Date  . Anemia   . CHF (congestive heart failure) ([place])   . Critical ischemia of lower extremity hospitalized 10/05/2015   right  . Diabetic neuropathy ([place])   . ESRD (end stage renal disease) on dialysis ([place]) 09/28/2011-01/23/2013   M-W-F; [place]  . GERD (gastroesophageal reflux disease)   . High cholesterol   . History of blood transfusion    related to "kidney transplant"  . Hypertension    sees Dr. Carolin Guernsey  . Metabolic bone disease   . PAD (peripheral artery disease) ([place])   . Peripheral vascular disease ([place])   . Pneumonia 09/27/2011  . Retinopathy   . Type II diabetes mellitus ([place])    "controlled with diet"    Family History  Problem Relation Age of Onset  . Malignant hyperthermia Mother   . Thyroid disease Mother   . Hypertension Mother   . Malignant hyperthermia Father   . Hypertension Father   . Diabetes Brother   . Heart disease Brother        CHF  . Anesthesia problems Neg Hx     Past Surgical History:  Procedure Laterality Date  . ABOVE KNEE LEG AMPUTATION Right   . AMPUTATION Right 10/13/2015   Procedure: Right Transmetatarsal Amputation;  Surgeon: Newt Minion, MD;  Location: [place];  Service: Orthopedics;  Laterality: Right;  . AMPUTATION Right 11/29/2015   Procedure: RIGHT BELOW KNEE AMPUTATION;  Surgeon: Newt Minion, MD;  Location: [place];  Service: Orthopedics;  Laterality: Right;  . AMPUTATION FINGER     Rt hand middle finger  . AV FISTULA  PLACEMENT  10/04/2011   Procedure: INSERTION OF ARTERIOVENOUS (AV) GORE-TEX GRAFT ARM;  Surgeon: Angelia Mould, MD;  Location: [place];  Service: Vascular;  Laterality: Left;  Insertion left upper arm Arteriovenous goretex graft  . AV FISTULA PLACEMENT  10/29/2011   Procedure: ARTERIOVENOUS (AV) FISTULA CREATION;  Surgeon: Angelia Mould, MD;  Location: [facility] OR;  Service: Vascular;  Laterality: Right;  Creation Right Arteriovenous Fistula   . [place] REMOVAL  10/04/2011   Procedure: REMOVAL OF ARTERIOVENOUS GORETEX GRAFT ([place]);  Surgeon: Elam Dutch, MD;  Location: [place];  Service: Vascular;  Laterality: Left;  . CHOLECYSTECTOMY N/A 12/08/2015   Procedure: LAPAROSCOPIC CHOLECYSTECTOMY WITH INTRAOPERATIVE CHOLANGIOGRAM;  Surgeon: Stark Klein, MD;  Location: [place];  Service: General;  Laterality: N/A;  .  ESOPHAGOGASTRODUODENOSCOPY N/A 12/24/2012   Procedure: ESOPHAGOGASTRODUODENOSCOPY (EGD);  Surgeon: Milus Banister, MD;  Location: [place];  Service: Endoscopy;  Laterality: N/A;  . FINGER AMPUTATION Right 02/26/2013   "3rd finger"  . INSERTION OF DIALYSIS CATHETER  10/04/2011   Procedure: INSERTION OF DIALYSIS CATHETER;  Surgeon: Angelia Mould, MD;  Location: [place];  Service: Vascular;  Laterality: Right;  insertion of dialysis catheter right internal jugular  . INSERTION OF DIALYSIS CATHETER  06/23/2012   Procedure: INSERTION OF DIALYSIS CATHETER;  Surgeon: Angelia Mould, MD;  Location: [place];  Service: Vascular;  Laterality: N/A;  Ultrasound guided  . KIDNEY TRANSPLANT  01/24/2013  . LOWER EXTREMITY ANGIOGRAPHY Left 08/12/2016   Procedure: Lower Extremity Angiography;  Surgeon: Algernon Huxley, MD;  Location: [place] CV LAB;  Service: Cardiovascular;  Laterality: Left;  . LOWER EXTREMITY INTERVENTION  08/12/2016   Procedure: Lower Extremity Intervention;  Surgeon: Algernon Huxley, MD;  Location: [place] CV LAB;  Service: Cardiovascular;;  . PATCH ANGIOPLASTY   06/23/2012   Procedure: PATCH ANGIOPLASTY;  Surgeon: Angelia Mould, MD;  Location: [place];  Service: Vascular;  Laterality: Right;  . PERIPHERAL VASCULAR CATHETERIZATION N/A 09/19/2015   Procedure: Lower Extremity Angiography;  Surgeon: Adrian Prows, MD;  Location: [place] CV LAB;  Service: Cardiovascular;  Laterality: N/A;  . PERIPHERAL VASCULAR CATHETERIZATION N/A 09/19/2015   Procedure: Abdominal Aortogram;  Surgeon: Adrian Prows, MD;  Location: [place] CV LAB;  Service: Cardiovascular;  Laterality: N/A;  . PERIPHERAL VASCULAR CATHETERIZATION Right 09/19/2015   Procedure: Peripheral Vascular Intervention;  Surgeon: Adrian Prows, MD;  Location: [place] CV LAB;  Service: Cardiovascular;  Laterality: Right;  Right Common  Iliac  . PERIPHERAL VASCULAR CATHETERIZATION N/A 10/06/2015   Procedure: Lower Extremity Angiography;  Surgeon: Adrian Prows, MD;  Location: [place] CV LAB;  Service: Cardiovascular;  Laterality: N/A;  . PERIPHERAL VASCULAR CATHETERIZATION  10/06/2015   Procedure: Peripheral Vascular Intervention;  Surgeon: Adrian Prows, MD;  Location: [place] CV LAB;  Service: Cardiovascular;;  REIA  Omnilink 6.0x29, innova 7x80  RSFA innova 5x80  . REVISON OF ARTERIOVENOUS FISTULA  06/23/2012   Procedure: REVISON OF ARTERIOVENOUS FISTULA;  Surgeon: Angelia Mould, MD;  Location: [place];  Service: Vascular;  Laterality: Right;  Ultrasound guided  . SHUNTOGRAM N/A 04/06/2012   Procedure: Earney Mallet;  Surgeon: Angelia Mould, MD;  Location: [facility] CATH LAB;  Service: Cardiovascular;  Laterality: N/A;   Social History   Occupational History  . Not on file  Tobacco Use  . Smoking status: Never Smoker  . Smokeless tobacco: Never Used  Substance and Sexual Activity  . Alcohol use: No  . Drug use: No  . Sexual activity: Never

## 2018-05-20 ENCOUNTER — Telehealth (INDEPENDENT_AMBULATORY_CARE_PROVIDER_SITE_OTHER): Payer: Self-pay | Admitting: Orthopedic Surgery

## 2018-05-20 ENCOUNTER — Other Ambulatory Visit (INDEPENDENT_AMBULATORY_CARE_PROVIDER_SITE_OTHER): Payer: Self-pay

## 2018-05-20 NOTE — Telephone Encounter (Signed)
Spoke with pt and she states that order was written for motorized scooter and needs for motorized wheelchair. This was faxed with dictation from [place] visit.

## 2018-05-20 NOTE — Telephone Encounter (Signed)
Patient requesting RX for a motorized wheelchair. Wants faxed [facility] [phone]

## 2018-05-21 ENCOUNTER — Other Ambulatory Visit: Payer: Self-pay | Admitting: Internal Medicine

## 2018-05-21 DIAGNOSIS — Z1231 Encounter for screening mammogram for malignant neoplasm of breast: Secondary | ICD-10-CM

## 2018-06-03 IMAGING — RF DG CHOLANGIOGRAM OPERATIVE
1 series · 5 of 5 positions shown · non-contrast
Comparison: None.

CLINICAL DATA: Gallstone disease

EXAM:
INTRAOPERATIVE CHOLANGIOGRAM
TECHNIQUE: Cholangiographic images from the C-arm fluoroscopic device were
submitted for interpretation post-operatively. Please see the
procedural report for the amount of contrast and the fluoroscopy
time utilized.

[Series 1: run · 2 acquisitions, 5 frames shown]
[im 1/2]
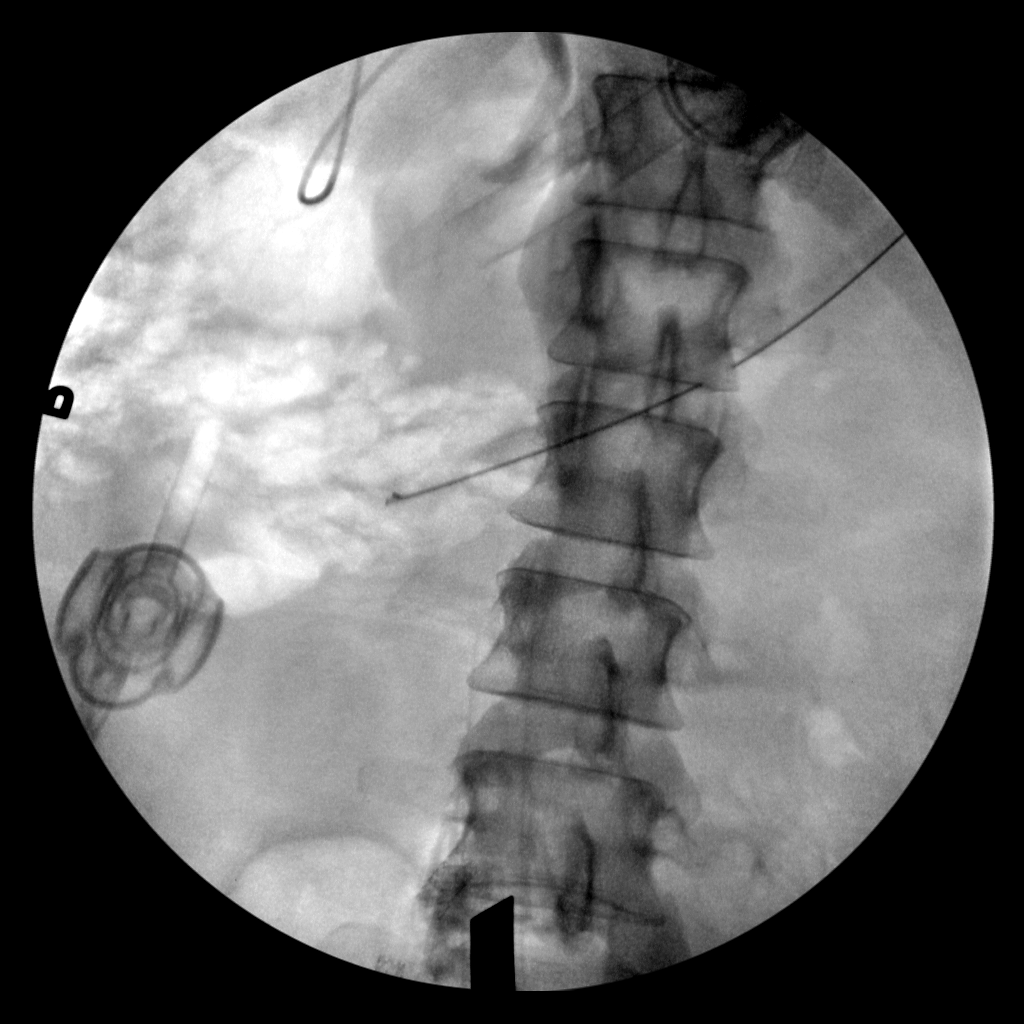
[im 1/2]
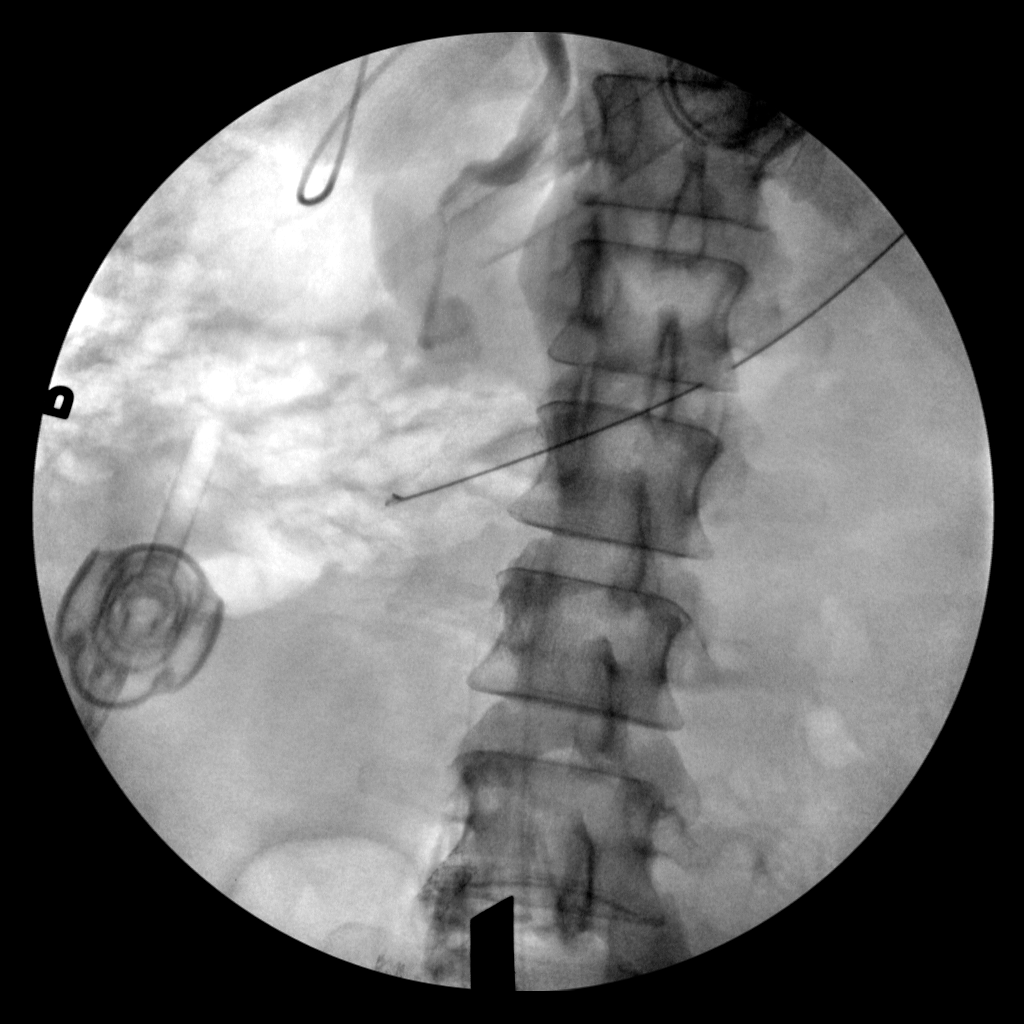
[im 1/2]
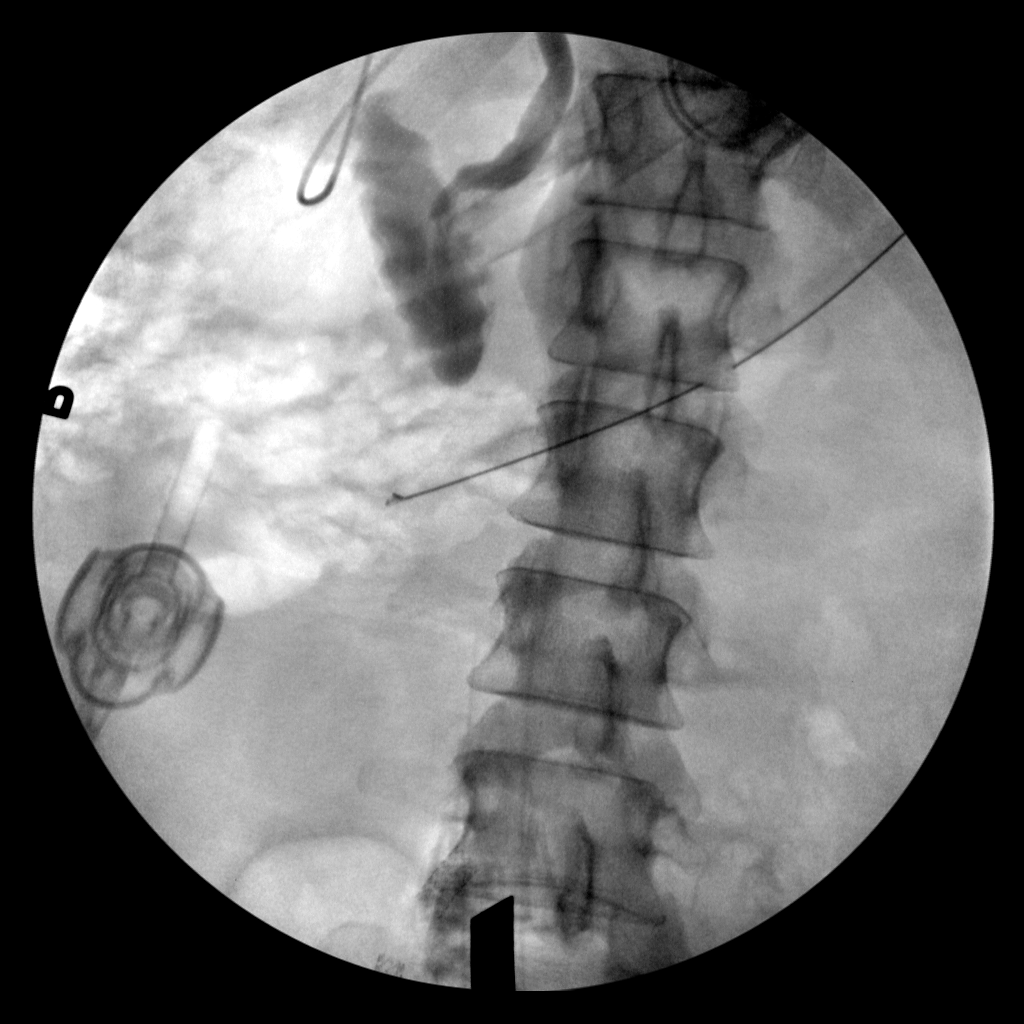
[im 1/2]
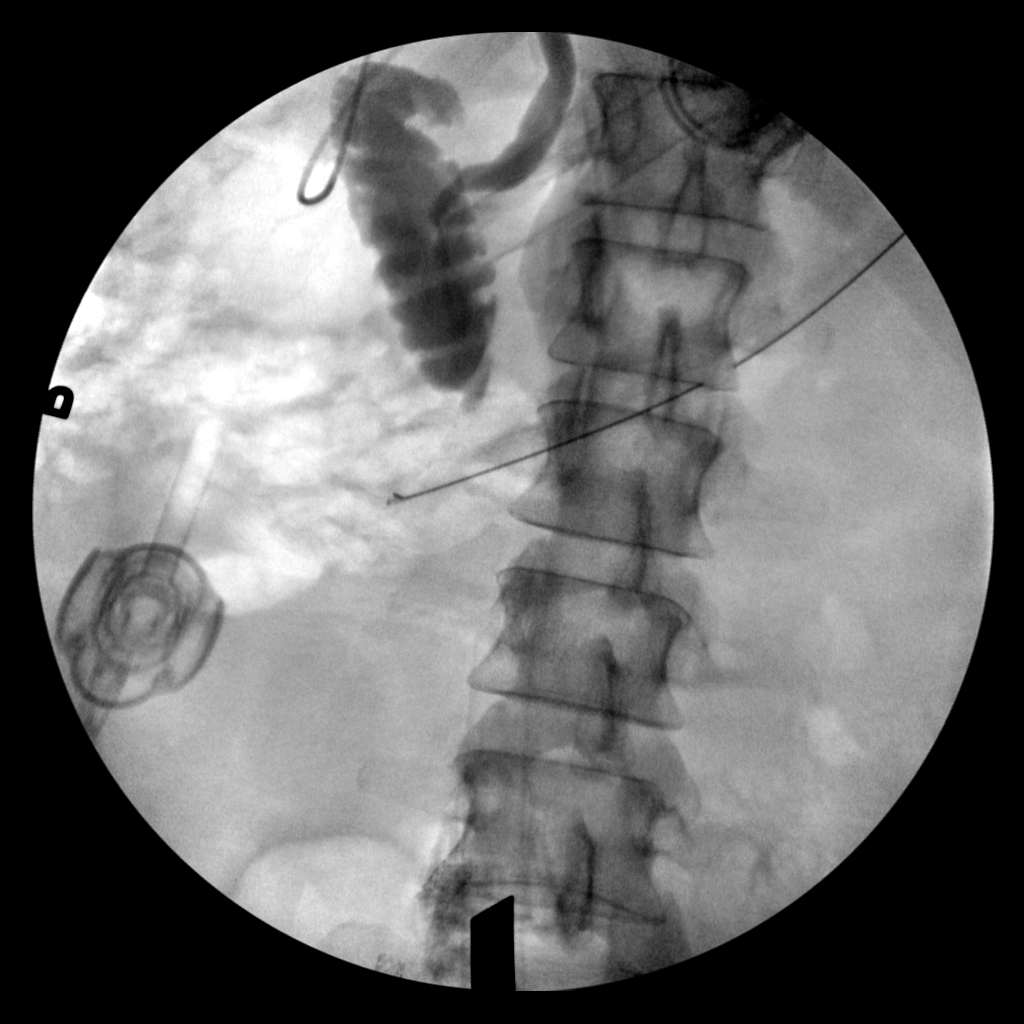
[im 2/2]
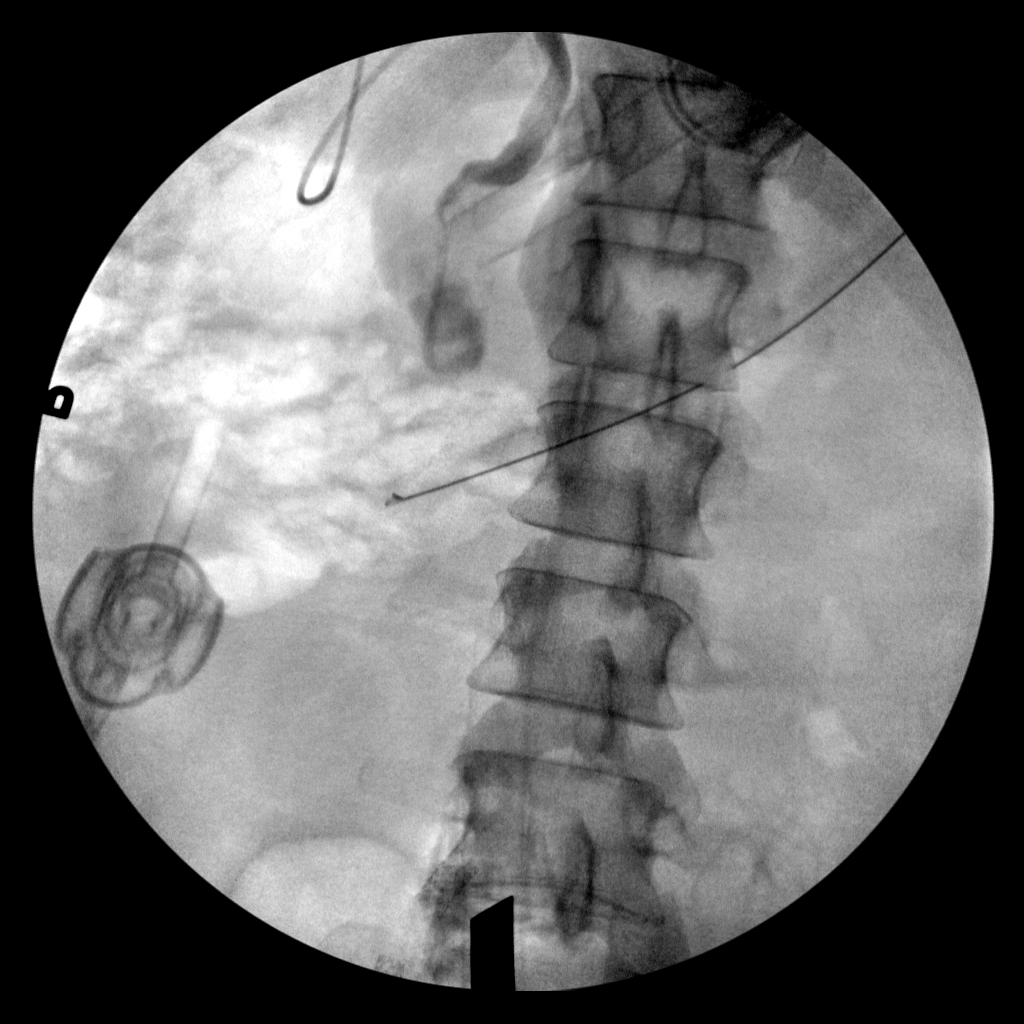

[5 of 5 positions shown; findings below may reference images not displayed]

FINDINGS: Contrast fills the biliary tree and duodenum without filling defects
in the common bile duct.
IMPRESSION: Patent biliary tree.

## 2018-06-04 DIAGNOSIS — Z794 Long term (current) use of insulin: Secondary | ICD-10-CM | POA: Diagnosis not present

## 2018-06-04 DIAGNOSIS — E1151 Type 2 diabetes mellitus with diabetic peripheral angiopathy without gangrene: Secondary | ICD-10-CM | POA: Diagnosis not present

## 2018-06-04 DIAGNOSIS — I739 Peripheral vascular disease, unspecified: Secondary | ICD-10-CM | POA: Diagnosis not present

## 2018-06-24 ENCOUNTER — Ambulatory Visit
Admission: RE | Admit: 2018-06-24 | Discharge: 2018-06-24 | Disposition: A | Discharge status: Home / Self Care | Payer: Medicaid Other | Source: Ambulatory Visit | Attending: Internal Medicine | Admitting: Internal Medicine

## 2018-06-24 DIAGNOSIS — Z1231 Encounter for screening mammogram for malignant neoplasm of breast: Secondary | ICD-10-CM

## 2018-06-26 ENCOUNTER — Ambulatory Visit: Payer: Self-pay

## 2018-06-28 ENCOUNTER — Encounter (HOSPITAL_COMMUNITY): Payer: Self-pay | Admitting: Emergency Medicine

## 2018-06-28 ENCOUNTER — Other Ambulatory Visit (HOSPITAL_COMMUNITY): Payer: Self-pay

## 2018-06-28 ENCOUNTER — Emergency Department (HOSPITAL_COMMUNITY): Payer: Medicare Other

## 2018-06-28 ENCOUNTER — Emergency Department (HOSPITAL_COMMUNITY)
Admission: EM | Admit: 2018-06-28 | Discharge: 2018-06-28 | Disposition: A | Discharge status: Home / Self Care | Payer: Medicare Other | Attending: Emergency Medicine | Admitting: Emergency Medicine

## 2018-06-28 ENCOUNTER — Other Ambulatory Visit: Payer: Self-pay

## 2018-06-28 DIAGNOSIS — I10 Essential (primary) hypertension: Secondary | ICD-10-CM | POA: Diagnosis not present

## 2018-06-28 DIAGNOSIS — I509 Heart failure, unspecified: Secondary | ICD-10-CM | POA: Diagnosis not present

## 2018-06-28 DIAGNOSIS — Z794 Long term (current) use of insulin: Secondary | ICD-10-CM | POA: Diagnosis not present

## 2018-06-28 DIAGNOSIS — Z7902 Long term (current) use of antithrombotics/antiplatelets: Secondary | ICD-10-CM | POA: Diagnosis not present

## 2018-06-28 DIAGNOSIS — I132 Hypertensive heart and chronic kidney disease with heart failure and with stage 5 chronic kidney disease, or end stage renal disease: Secondary | ICD-10-CM | POA: Insufficient documentation

## 2018-06-28 DIAGNOSIS — N186 End stage renal disease: Secondary | ICD-10-CM | POA: Insufficient documentation

## 2018-06-28 DIAGNOSIS — H538 Other visual disturbances: Secondary | ICD-10-CM | POA: Insufficient documentation

## 2018-06-28 DIAGNOSIS — Z79899 Other long term (current) drug therapy: Secondary | ICD-10-CM | POA: Insufficient documentation

## 2018-06-28 DIAGNOSIS — R42 Dizziness and giddiness: Secondary | ICD-10-CM | POA: Diagnosis not present

## 2018-06-28 DIAGNOSIS — R55 Syncope and collapse: Secondary | ICD-10-CM | POA: Insufficient documentation

## 2018-06-28 DIAGNOSIS — H748X3 Other specified disorders of middle ear and mastoid, bilateral: Secondary | ICD-10-CM | POA: Diagnosis not present

## 2018-06-28 DIAGNOSIS — E1122 Type 2 diabetes mellitus with diabetic chronic kidney disease: Secondary | ICD-10-CM | POA: Insufficient documentation

## 2018-06-28 DIAGNOSIS — Z992 Dependence on renal dialysis: Secondary | ICD-10-CM | POA: Diagnosis not present

## 2018-06-28 DIAGNOSIS — E1140 Type 2 diabetes mellitus with diabetic neuropathy, unspecified: Secondary | ICD-10-CM | POA: Diagnosis not present

## 2018-06-28 DIAGNOSIS — R51 Headache: Secondary | ICD-10-CM | POA: Diagnosis not present

## 2018-06-28 LAB — CBC WITH DIFFERENTIAL/PLATELET
Abs Immature Granulocytes: 0 10*3/uL (ref 0.00–0.07)
Basophils Absolute: 0 10*3/uL (ref 0.0–0.1)
Basophils Relative: 0 %
Eosinophils Absolute: 0.1 10*3/uL (ref 0.0–0.5)
Eosinophils Relative: 4 %
HCT: 38.5 % (ref 36.0–46.0)
Hemoglobin: 11.4 g/dL — ABNORMAL LOW (ref 12.0–15.0)
Lymphocytes Relative: 21 %
Lymphs Abs: 0.7 10*3/uL (ref 0.7–4.0)
MCH: 23.8 pg — ABNORMAL LOW (ref 26.0–34.0)
MCHC: 29.6 g/dL — ABNORMAL LOW (ref 30.0–36.0)
MCV: 80.5 fL (ref 80.0–100.0)
Monocytes Absolute: 0.4 10*3/uL (ref 0.1–1.0)
Monocytes Relative: 14 %
Neutro Abs: 1.9 10*3/uL (ref 1.7–7.7)
Neutrophils Relative %: 61 %
Platelets: 152 10*3/uL (ref 150–400)
RBC: 4.78 MIL/uL (ref 3.87–5.11)
RDW: 13.8 % (ref 11.5–15.5)
WBC: 3.1 10*3/uL — ABNORMAL LOW (ref 4.0–10.5)
nRBC: 0 % (ref 0.0–0.2)
nRBC: 1 /100 WBC — ABNORMAL HIGH

## 2018-06-28 LAB — COMPREHENSIVE METABOLIC PANEL
ALT: 12 U/L (ref 0–44)
AST: 19 U/L (ref 15–41)
Albumin: 3.5 g/dL (ref 3.5–5.0)
Alkaline Phosphatase: 39 U/L (ref 38–126)
Anion gap: 8 (ref 5–15)
BUN: 17 mg/dL (ref 6–20)
CO2: 23 mmol/L (ref 22–32)
Calcium: 9 mg/dL (ref 8.9–10.3)
Chloride: 108 mmol/L (ref 98–111)
Creatinine, Ser: 1.01 mg/dL — ABNORMAL HIGH (ref 0.44–1.00)
GFR calc Af Amer: >60 mL/min (ref >60)
GFR calc non Af Amer: >60 mL/min (ref >60)
Glucose, Bld: 102 mg/dL — ABNORMAL HIGH (ref 70–99)
Potassium: 4 mmol/L (ref 3.5–5.1)
Sodium: 139 mmol/L (ref 135–145)
Total Bilirubin: 0.4 mg/dL (ref 0.3–1.2)
Total Protein: 6.5 g/dL (ref 6.5–8.1)

## 2018-06-28 LAB — I-STAT TROPONIN, ED: Troponin i, poc: 0.01 ng/mL (ref 0.00–0.08)

## 2018-06-28 NOTE — ED Provider Notes (Signed)
  Face-to-face evaluation   History: She presents for evaluation of syncope which occurred while she was singing in church.  She denies injury.  She had a prodrome prior to the syncope of feeling dizzy.  No other recent illnesses.  Physical exam:, Calm, cooperative.  No dysarthria or aphasia.  She is lucid.  No respiratory distress.  She is moving arms and legs normally.  Medical screening examination/treatment/procedure(s) were conducted as a shared visit with non-physician practitioner(s) and myself.  I personally evaluated the patient during the encounter    Daleen Bo, MD 06/29/18 1051

## 2018-06-28 NOTE — ED Provider Notes (Signed)
[place] [facility] EMERGENCY DEPARTMENT Provider Note   CSN: [id number] Arrival date & time: 06/28/18  1126     History   Chief Complaint Chief Complaint  Patient presents with  . Loss of Consciousness    HPI [name] is a 48 y.o. female.  48 y.o female with a PMH of CHF, Anemia, DM, ESRD presents to the ED s/p syncope episode at church this morning. Patient reports singing and praising the choir after confession and reports feeling very lightheaded with a blurry vision then reports falling down. She endorses LOC, and reports waking up seating up surrounded by her friends. Patient reports feeling well prior to episode along with hanging toast and a protein shake for breakfast. She denies any sick contacts, chest pain, shortness of breath, headache or fevers. Patient is a kidney transplant patient (2014).      Past Medical History:  Diagnosis Date  . Anemia   . CHF (congestive heart failure) ([place])   . Critical ischemia of lower extremity hospitalized 10/05/2015   right  . Diabetic neuropathy ([place])   . ESRD (end stage renal disease) on dialysis ([place]) 09/28/2011-01/23/2013   M-W-F; [place]  . GERD (gastroesophageal reflux disease)   . High cholesterol   . History of blood transfusion    related to "kidney transplant"  . Hypertension    sees Dr. Carolin Guernsey  . Metabolic bone disease   . PAD (peripheral artery disease) ([place])   . Peripheral vascular disease ([place])   . Pneumonia 09/27/2011  . Retinopathy   . Type II diabetes mellitus ([place])    "controlled with diet"    Patient Active Problem List   Diagnosis Date Noted  . Steal syndrome as complication of dialysis access ([place]) 03/28/2017  . Transaminitis 12/06/2015  . Acute on chronic renal failure ([place]) 12/06/2015  . History of renal transplant 12/06/2015  . UTI (lower urinary tract infection) 12/06/2015  . Acute cholecystitis 12/06/2015  . Below knee amputation status 11/29/2015  . Asystole ([place])    . S/P unilateral BKA (below knee amputation) ([place])   . Pericardial effusion   . Type 2 diabetes mellitus with peripheral neuropathy (HCC)   . Chronic kidney disease, stage IV (severe) ([place])   . Essential hypertension   . Anemia of chronic disease   . Acute blood loss anemia   . Gangrene associated with diabetes mellitus ([place]) 10/13/2015  . Critical lower limb ischemia 10/05/2015  . PAD (peripheral artery disease) ([place]) 09/18/2015  . Leukopenia 08/22/2014  . Protein-calorie malnutrition, severe ([place]) 12/24/2012  . Gastric ulcer 12/24/2012  . Mechanical complication of other vascular device, implant, and graft 09/22/2012  . End stage renal disease ([place]) 10/23/2011  . Physical deconditioning 10/09/2011  . HTN (hypertension), malignant 09/27/2011  . Chronic kidney disease, stage 4, severely decreased GFR (HCC) 09/27/2011  . PNA (pneumonia) 09/27/2011  . Acute respiratory failure with hypoxia ([place]) 09/27/2011  . VOMITING 12/06/2009  . GOUT 12/21/2008  . Uncontrolled type 2 diabetes mellitus with peripheral artery disease ([place]) 08/14/2006  . Hyperlipidemia 08/14/2006  . OBESITY, NOS 08/14/2006  . FIBROADENOSIS, BREAST 08/14/2006  . Irregular menstrual cycle 08/14/2006    Past Surgical History:  Procedure Laterality Date  . ABOVE KNEE LEG AMPUTATION Right   . AMPUTATION Right 10/13/2015   Procedure: Right Transmetatarsal Amputation;  Surgeon: Newt Minion, MD;  Location: [place];  Service: Orthopedics;  Laterality: Right;  . AMPUTATION Right 11/29/2015   Procedure: RIGHT BELOW KNEE AMPUTATION;  Surgeon: Newt Minion, MD;  Location: [place];  Service: Orthopedics;  Laterality: Right;  . AMPUTATION FINGER     Rt hand middle finger  . AV FISTULA PLACEMENT  10/04/2011   Procedure: INSERTION OF ARTERIOVENOUS (AV) GORE-TEX GRAFT ARM;  Surgeon: Angelia Mould, MD;  Location: [place];  Service: Vascular;  Laterality: Left;  Insertion left upper arm Arteriovenous goretex graft  . AV FISTULA  PLACEMENT  10/29/2011   Procedure: ARTERIOVENOUS (AV) FISTULA CREATION;  Surgeon: Angelia Mould, MD;  Location: [facility] OR;  Service: Vascular;  Laterality: Right;  Creation Right Arteriovenous Fistula   . [place] REMOVAL  10/04/2011   Procedure: REMOVAL OF ARTERIOVENOUS GORETEX GRAFT ([place]);  Surgeon: Elam Dutch, MD;  Location: [place];  Service: Vascular;  Laterality: Left;  . CHOLECYSTECTOMY N/A 12/08/2015   Procedure: LAPAROSCOPIC CHOLECYSTECTOMY WITH INTRAOPERATIVE CHOLANGIOGRAM;  Surgeon: Stark Klein, MD;  Location: [place];  Service: General;  Laterality: N/A;  . ESOPHAGOGASTRODUODENOSCOPY N/A 12/24/2012   Procedure: ESOPHAGOGASTRODUODENOSCOPY (EGD);  Surgeon: Milus Banister, MD;  Location: [place];  Service: Endoscopy;  Laterality: N/A;  . FINGER AMPUTATION Right 02/26/2013   "3rd finger"  . INSERTION OF DIALYSIS CATHETER  10/04/2011   Procedure: INSERTION OF DIALYSIS CATHETER;  Surgeon: Angelia Mould, MD;  Location: [place];  Service: Vascular;  Laterality: Right;  insertion of dialysis catheter right internal jugular  . INSERTION OF DIALYSIS CATHETER  06/23/2012   Procedure: INSERTION OF DIALYSIS CATHETER;  Surgeon: Angelia Mould, MD;  Location: [place];  Service: Vascular;  Laterality: N/A;  Ultrasound guided  . KIDNEY TRANSPLANT  01/24/2013  . LOWER EXTREMITY ANGIOGRAPHY Left 08/12/2016   Procedure: Lower Extremity Angiography;  Surgeon: Algernon Huxley, MD;  Location: [place] CV LAB;  Service: Cardiovascular;  Laterality: Left;  . LOWER EXTREMITY INTERVENTION  08/12/2016   Procedure: Lower Extremity Intervention;  Surgeon: Algernon Huxley, MD;  Location: [place] CV LAB;  Service: Cardiovascular;;  . PATCH ANGIOPLASTY  06/23/2012   Procedure: PATCH ANGIOPLASTY;  Surgeon: Angelia Mould, MD;  Location: [place];  Service: Vascular;  Laterality: Right;  . PERIPHERAL VASCULAR CATHETERIZATION N/A 09/19/2015   Procedure: Lower Extremity Angiography;  Surgeon: Adrian Prows, MD;   Location: [place] CV LAB;  Service: Cardiovascular;  Laterality: N/A;  . PERIPHERAL VASCULAR CATHETERIZATION N/A 09/19/2015   Procedure: Abdominal Aortogram;  Surgeon: Adrian Prows, MD;  Location: [place] CV LAB;  Service: Cardiovascular;  Laterality: N/A;  . PERIPHERAL VASCULAR CATHETERIZATION Right 09/19/2015   Procedure: Peripheral Vascular Intervention;  Surgeon: Adrian Prows, MD;  Location: [place] CV LAB;  Service: Cardiovascular;  Laterality: Right;  Right Common  Iliac  . PERIPHERAL VASCULAR CATHETERIZATION N/A 10/06/2015   Procedure: Lower Extremity Angiography;  Surgeon: Adrian Prows, MD;  Location: [place] CV LAB;  Service: Cardiovascular;  Laterality: N/A;  . PERIPHERAL VASCULAR CATHETERIZATION  10/06/2015   Procedure: Peripheral Vascular Intervention;  Surgeon: Adrian Prows, MD;  Location: [place] CV LAB;  Service: Cardiovascular;;  REIA  Omnilink 6.0x29, innova 7x80  RSFA innova 5x80  . REVISON OF ARTERIOVENOUS FISTULA  06/23/2012   Procedure: REVISON OF ARTERIOVENOUS FISTULA;  Surgeon: Angelia Mould, MD;  Location: [place];  Service: Vascular;  Laterality: Right;  Ultrasound guided  . SHUNTOGRAM N/A 04/06/2012   Procedure: Earney Mallet;  Surgeon: Angelia Mould, MD;  Location: [facility] CATH LAB;  Service: Cardiovascular;  Laterality: N/A;     OB History   No obstetric history on  file.      Home Medications    Prior to Admission medications   Medication Sig Start Date End Date Taking? Authorizing Provider  albuterol (PROVENTIL HFA;VENTOLIN HFA) 108 (90 BASE) MCG/ACT inhaler Inhale 1 puff into the lungs every 6 (six) hours as needed for wheezing or shortness of breath.   Yes [provider]  amLODipine (NORVASC) 10 MG tablet Take 10 mg by mouth at bedtime.  11/14/11  Yes [provider]  betamethasone dipropionate (DIPROLENE) 0.05 % cream  10/26/17  Yes [provider]  fluticasone (FLONASE) 50 MCG/ACT nasal spray 2 sprays by Each Nare route  daily for 30 days. 06/26/17  Yes [provider]  furosemide (LASIX) 40 MG tablet Take 40 mg by mouth daily.  12/25/16  Yes [provider]  isosorbide-hydrALAZINE (BIDIL) 20-37.5 MG tablet Take 1 tablet by mouth daily.    Yes [provider]  labetalol (NORMODYNE) 300 MG tablet Take 300 mg by mouth at bedtime.  11/06/15  Yes [provider]  mycophenolate (MYFORTIC) 180 MG EC tablet Take 540 mg by mouth 2 (two) times daily. (540mg ) in the morning and bedtime-( 3 capsules )   Yes [provider]  pantoprazole (PROTONIX) 40 MG tablet Take 40 mg by mouth at bedtime.    Yes [provider]  pravastatin (PRAVACHOL) 20 MG tablet Take 20 mg by mouth at bedtime.  09/11/12  Yes [provider]  sulfamethoxazole-trimethoprim (BACTRIM,SEPTRA) 400-80 MG per tablet Take 1 tablet by mouth every Monday, Wednesday, and Friday.    Yes Reeves-Daniel, Amber, DO  tacrolimus (PROGRAF) 1 MG capsule Take 2 mg by mouth 2 (two) times daily.    Yes [provider]  insulin aspart (NOVOLOG) 100 UNIT/ML injection Inject 0-16 Units into the skin. Pt on insulin pump.  Total of 16 units per day.    [provider]  insulin detemir (LEVEMIR) 100 UNIT/ML injection Inject 0.06 mLs (6 Units total) into the skin at bedtime. Patient not taking: Reported on 06/28/2018 12/11/15   Lavina Hamman, MD  nitrofurantoin, Earney [place], (MACROBID) 100 MG capsule  03/25/17   [provider]    Family History Family History  Problem Relation Age of Onset  . Malignant hyperthermia Mother   . Thyroid disease Mother   . Hypertension Mother   . Malignant hyperthermia Father   . Hypertension Father   . Diabetes Brother   . Heart disease Brother        CHF  . Anesthesia problems Neg Hx     Social History Social History   Tobacco Use  . Smoking status: Never Smoker  . Smokeless tobacco: Never Used  Substance Use Topics  . Alcohol use: No    . Drug use: No     Allergies   Patient has no known allergies.   Review of Systems Review of Systems  Constitutional: Negative for fever.  HENT: Negative for sore throat.   Respiratory: Negative for shortness of breath.   Cardiovascular: Negative for chest pain.  Gastrointestinal: Negative for abdominal pain.  Genitourinary: Negative for dysuria and flank pain.  Musculoskeletal: Negative for back pain.  Skin: Negative for pallor.  Neurological: Positive for dizziness and light-headedness. Negative for facial asymmetry and headaches.  All other systems reviewed and are negative.    Physical Exam Updated Vital Signs BP (!) 155/68   Pulse 92   Temp 98.4 F (36.9 C) (Oral)   Resp (!) 25   Ht 5\' 1"  (1.549 m)  Wt 73.5 kg   LMP 06/18/2018 (Approximate)   SpO2 100%   BMI 30.61 kg/m   Physical Exam Vitals signs and nursing note reviewed.  Constitutional:      General: She is not in acute distress.    Appearance: She is well-developed.  HENT:     Head: Normocephalic and atraumatic.     Mouth/Throat:     Pharynx: No oropharyngeal exudate.  Eyes:     Pupils: Pupils are equal, round, and reactive to light.  Neck:     Musculoskeletal: Normal range of motion.  Cardiovascular:     Rate and Rhythm: Regular rhythm.     Heart sounds: Normal heart sounds.  Pulmonary:     Effort: Pulmonary effort is normal. No respiratory distress.     Breath sounds: Normal breath sounds.  Abdominal:     General: Bowel sounds are normal. There is no distension.     Palpations: Abdomen is soft.     Tenderness: There is no abdominal tenderness.  Musculoskeletal:        General: No tenderness or deformity.     Right lower leg: No edema.     Left lower leg: No edema.     Comments: Prosthetic right leg, no movement.   Skin:    General: Skin is warm and dry.  Neurological:     Mental Status: She is alert and oriented to person, place, and time.     Comments: Alert, oriented, thought  content appropriate. Speech fluent without evidence of aphasia. Able to follow 2 step commands without difficulty.  Cranial Nerves:  II:  Peripheral visual fields grossly normal, pupils, round, reactive to light III,IV, VI: ptosis not present, extra-ocular motions intact bilaterally  V,VII: smile symmetric, facial light touch sensation equal VIII: hearing grossly normal bilaterally  IX,X: midline uvula rise  XI: bilateral shoulder shrug equal and strong XII: midline tongue extension  Motor:  5/5 in upper and lower extremities bilaterally including strong and equal grip strength and dorsiflexion/plantar flexion Sensory: light touch normal in all extremities except right leg.  Cerebellar: normal finger-to-nose with bilateral upper extremities, pronator drift negative        ED Treatments / Results  Labs (all labs ordered are listed, but only abnormal results are displayed) Labs Reviewed  CBC WITH DIFFERENTIAL/PLATELET - Abnormal; Notable for the following components:      Result Value   WBC 3.1 (*)    Hemoglobin 11.4 (*)    MCH 23.8 (*)    MCHC 29.6 (*)    nRBC 1 (*)    All other components within normal limits  COMPREHENSIVE METABOLIC PANEL - Abnormal; Notable for the following components:   Glucose, Bld 102 (*)    Creatinine, Ser 1.01 (*)    All other components within normal limits  URINALYSIS, ROUTINE W REFLEX MICROSCOPIC  I-STAT TROPONIN, ED    EKG EKG Interpretation  Date/Time:  Sunday June 28 2018 11:29:42 EST Ventricular Rate:  94 PR Interval:    QRS Duration: 80 QT Interval:  365 QTC Calculation: 457 R Axis:   14 Text Interpretation:  Sinus rhythm Since last tracing rate slower Confirmed by Daleen Bo [phone]) on 06/28/2018 1:13:19 PM   Radiology Dg Chest 2 View  Result Date: 06/28/2018 CLINICAL DATA:  Initial evaluation for acute syncope. EXAM: CHEST - 2 VIEW COMPARISON:  Prior radiograph from 12/06/2015. FINDINGS: The cardiac and mediastinal  silhouettes are stable in size and contour, and remain within normal limits. The lungs are  normally inflated. No airspace consolidation, pleural effusion, or pulmonary edema is identified. There is no pneumothorax. No acute osseous abnormality identified. IMPRESSION: No active cardiopulmonary disease. Electronically Signed   By: Jeannine Boga M.D.   On: 06/28/2018 14:38   Ct Head Wo Contrast  Result Date: 06/28/2018 CLINICAL DATA:  Syncope. EXAM: CT HEAD WITHOUT CONTRAST TECHNIQUE: Contiguous axial images were obtained from the base of the skull through the vertex without intravenous contrast. COMPARISON:  12/06/2015. FINDINGS: Brain: There is no evidence for acute hemorrhage, hydrocephalus, mass lesion, or abnormal extra-axial fluid collection. No definite CT evidence for acute infarction. Vascular: No hyperdense vessel or unexpected calcification. Skull: No evidence for fracture. No worrisome lytic or sclerotic lesion. Sinuses/Orbits: The visualized paranasal sinuses and mastoid air cells are clear. Visualized portions of the globes and intraorbital fat are unremarkable. Other: None. IMPRESSION: Stable CT brain.  No acute intracranial abnormality. Bilateral mastoid effusions seen previously have resolved. Electronically Signed   By: Misty Stanley M.D.   On: 06/28/2018 13:30    Procedures Procedures (including critical care time)  Medications Ordered in ED Medications - No data to display   Initial Impression / Assessment and Plan / ED Course  I have reviewed the triage vital signs and the nursing notes.  Pertinent labs & imaging results that were available during my care of the patient were reviewed by me and considered in my medical decision making (see chart for details).     Patient presents with syncope episode at church this morning. Positive LOC, was caught by members of the church. CBC showed no leukocytosis slight decrease in WBC, she is a kidney transplant patient. CMP showed no  electrolyte abnormality, creatine is slightly elevated but within normal limit. Hemoglobin is slightly decreased. First troponin was negative. Patient reports some lightheadedness prior to syncope, low suspicion for cardiac with negative troponin, denies chest pain and EKG within normal limits. Patient was recently seen by PCP one month ago, has good follow up she also reports an appointment scheduled with her kidney doctor in the upcoming two weeks.   No hypoxia, denies any shortness of breath, slightly elevated HR but within normal range for patient. DG chest xray showed: no consolidation, no pleural effusion, no pneumothorax. CT Head w/o obtained to r/o any acute process or hemorrhage: No acute process at this time.   Patient has ambulated in the ED, reports feels better has no chest pain, shortness of breath or dizziness. Orthostatic vitals performed. Patient stable for discharge with close follow up. Return precautions discussed at length. Will provide patient a note for work tomorrow. Stable for discharge.   Final Clinical Impressions(s) / ED Diagnoses   Final diagnoses:  Near syncope    ED Discharge Orders    None       Janeece Fitting, [place] 06/28/18 1516    Daleen Bo, MD 06/29/18 1051

## 2018-06-28 NOTE — Discharge Instructions (Addendum)
If you experience any chest pain, shortness of breath or worsening symptoms please return to the ED for reevaluation.

## 2018-06-28 NOTE — ED Triage Notes (Signed)
Per [facility] pt coming from church after having a syncopal episode. Patient was caught and never hit the ground. Patient remembers feeling dizzy. Patient has no complaints at this time. Hx of kidney transplant in 2014.

## 2018-06-30 DIAGNOSIS — E785 Hyperlipidemia, unspecified: Secondary | ICD-10-CM | POA: Diagnosis not present

## 2018-06-30 DIAGNOSIS — E119 Type 2 diabetes mellitus without complications: Secondary | ICD-10-CM | POA: Diagnosis not present

## 2018-06-30 DIAGNOSIS — Z0001 Encounter for general adult medical examination with abnormal findings: Secondary | ICD-10-CM | POA: Diagnosis not present

## 2018-06-30 DIAGNOSIS — K219 Gastro-esophageal reflux disease without esophagitis: Secondary | ICD-10-CM | POA: Diagnosis not present

## 2018-06-30 DIAGNOSIS — I10 Essential (primary) hypertension: Secondary | ICD-10-CM | POA: Diagnosis not present

## 2018-06-30 DIAGNOSIS — R55 Syncope and collapse: Secondary | ICD-10-CM | POA: Diagnosis not present

## 2018-07-02 DIAGNOSIS — N2581 Secondary hyperparathyroidism of renal origin: Secondary | ICD-10-CM | POA: Diagnosis not present

## 2018-07-02 DIAGNOSIS — Z940 Kidney transplant status: Secondary | ICD-10-CM | POA: Diagnosis not present

## 2018-07-02 DIAGNOSIS — I129 Hypertensive chronic kidney disease with stage 1 through stage 4 chronic kidney disease, or unspecified chronic kidney disease: Secondary | ICD-10-CM | POA: Diagnosis not present

## 2018-07-02 DIAGNOSIS — N189 Chronic kidney disease, unspecified: Secondary | ICD-10-CM | POA: Diagnosis not present

## 2018-07-09 DIAGNOSIS — E1165 Type 2 diabetes mellitus with hyperglycemia: Secondary | ICD-10-CM | POA: Diagnosis not present

## 2018-07-09 DIAGNOSIS — E7800 Pure hypercholesterolemia, unspecified: Secondary | ICD-10-CM | POA: Diagnosis not present

## 2018-07-09 DIAGNOSIS — R809 Proteinuria, unspecified: Secondary | ICD-10-CM | POA: Diagnosis not present

## 2018-07-09 DIAGNOSIS — I10 Essential (primary) hypertension: Secondary | ICD-10-CM | POA: Diagnosis not present

## 2018-07-10 DIAGNOSIS — I10 Essential (primary) hypertension: Secondary | ICD-10-CM | POA: Diagnosis not present

## 2018-07-15 ENCOUNTER — Encounter (INDEPENDENT_AMBULATORY_CARE_PROVIDER_SITE_OTHER): Payer: Self-pay | Admitting: Orthopedic Surgery

## 2018-07-15 ENCOUNTER — Ambulatory Visit (INDEPENDENT_AMBULATORY_CARE_PROVIDER_SITE_OTHER): Payer: Medicare Other | Admitting: Orthopedic Surgery

## 2018-07-15 VITALS — Ht 61.0 in | Wt 162.0 lb

## 2018-07-15 DIAGNOSIS — D631 Anemia in chronic kidney disease: Secondary | ICD-10-CM | POA: Diagnosis not present

## 2018-07-15 DIAGNOSIS — I129 Hypertensive chronic kidney disease with stage 1 through stage 4 chronic kidney disease, or unspecified chronic kidney disease: Secondary | ICD-10-CM | POA: Diagnosis not present

## 2018-07-15 DIAGNOSIS — N2581 Secondary hyperparathyroidism of renal origin: Secondary | ICD-10-CM | POA: Diagnosis not present

## 2018-07-15 DIAGNOSIS — Z89511 Acquired absence of right leg below knee: Secondary | ICD-10-CM

## 2018-07-15 DIAGNOSIS — Z940 Kidney transplant status: Secondary | ICD-10-CM | POA: Diagnosis not present

## 2018-07-15 DIAGNOSIS — N182 Chronic kidney disease, stage 2 (mild): Secondary | ICD-10-CM | POA: Diagnosis not present

## 2018-07-15 DIAGNOSIS — N390 Urinary tract infection, site not specified: Secondary | ICD-10-CM | POA: Diagnosis not present

## 2018-07-15 NOTE — Progress Notes (Signed)
Office Visit Note   Patient: [name]           Date of Birth: 15-Jul-1970           MRN: [id number] Visit Date: 07/15/2018              Requested by: Benito Mccreedy, MD [address] [place], [place] [address] PCP: Benito Mccreedy, MD  Chief Complaint  Patient presents with  . Right Leg - Follow-up      HPI: Patient is a 48 year old woman who is status post a right transtibial amputation about 2-1/2 years ago.  Presents inquiring about obtaining a motorized wheelchair from [employer].  Patient is status post kidney transplant she is off dialysis states she is doing well she is these cardiology as well as vascular surgery with no open issues.  She has a history of diabetes congestive heart failure hypertension and anemia.  She has no history of strokes denies any weakness in her upper extremities or lower extremities.  Assessment & Plan: Visit Diagnoses:  1. Acquired absence of right leg below knee (HCC)     Plan: Discussed with the patient with her good motor strength in upper and lower extremities she should use her portable and other wheelchairs for long distance transportation.  At this time she would not qualify for a motorized wheelchair through Medicare.  Follow-Up Instructions: Return if symptoms worsen or fail to improve.   Ortho Exam  Patient is alert, oriented, no adenopathy, well-dressed, normal affect, normal respiratory effort. Examination patient has a normal gait she has a well fitting prosthesis.  No weakness in her lower extremities she has no weakness in her upper extremities she has normal posture, good balance.  Imaging: No results found. No images are attached to the encounter.  Labs: Lab Results  Component Value Date   HGBA1C 8.3 (H) 10/13/2015   HGBA1C 6.3 (H) 12/20/2012   HGBA1C 6.1 (H) 09/27/2011   REPTSTATUS 12/09/2015 FINAL 12/06/2015   CULT 40,000 COLONIES/mL ESCHERICHIA COLI (A) 12/06/2015   LABORGA ESCHERICHIA  COLI (A) 12/06/2015     Lab Results  Component Value Date   ALBUMIN 3.5 06/28/2018   ALBUMIN 1.8 (L) 12/10/2015   ALBUMIN 1.8 (L) 12/09/2015    Body mass index is 30.61 kg/m.  Orders:  No orders of the defined types were placed in this encounter.  No orders of the defined types were placed in this encounter.    Procedures: No procedures performed  Clinical Data: No additional findings.  ROS:  All other systems negative, except as noted in the HPI. Review of Systems  Objective: Vital Signs: Ht 5\' 1"  (1.549 m)   Wt 162 lb (73.5 kg)   LMP 06/18/2018 (Approximate)   BMI 30.61 kg/m   Specialty Comments:  No specialty comments available.  PMFS History: Patient Active Problem List   Diagnosis Date Noted  . Steal syndrome as complication of dialysis access ([place]) 03/28/2017  . Transaminitis 12/06/2015  . Acute on chronic renal failure ([place]) 12/06/2015  . History of renal transplant 12/06/2015  . UTI (lower urinary tract infection) 12/06/2015  . Acute cholecystitis 12/06/2015  . Below knee amputation status 11/29/2015  . Asystole ([place])   . S/P unilateral BKA (below knee amputation) ([place])   . Pericardial effusion   . Type 2 diabetes mellitus with peripheral neuropathy (HCC)   . Chronic kidney disease, stage IV (severe) ([place])   . Essential hypertension   . Anemia of chronic disease   .  Acute blood loss anemia   . Gangrene associated with diabetes mellitus ([place]) 10/13/2015  . Critical lower limb ischemia 10/05/2015  . PAD (peripheral artery disease) ([place]) 09/18/2015  . Leukopenia 08/22/2014  . Protein-calorie malnutrition, severe ([place]) 12/24/2012  . Gastric ulcer 12/24/2012  . Mechanical complication of other vascular device, implant, and graft 09/22/2012  . End stage renal disease ([place]) 10/23/2011  . Physical deconditioning 10/09/2011  . HTN (hypertension), malignant 09/27/2011  . Chronic kidney disease, stage 4, severely decreased GFR (HCC) 09/27/2011  . PNA  (pneumonia) 09/27/2011  . Acute respiratory failure with hypoxia ([place]) 09/27/2011  . VOMITING 12/06/2009  . GOUT 12/21/2008  . Uncontrolled type 2 diabetes mellitus with peripheral artery disease ([place]) 08/14/2006  . Hyperlipidemia 08/14/2006  . OBESITY, NOS 08/14/2006  . FIBROADENOSIS, BREAST 08/14/2006  . Irregular menstrual cycle 08/14/2006   Past Medical History:  Diagnosis Date  . Anemia   . CHF (congestive heart failure) ([place])   . Critical ischemia of lower extremity hospitalized 10/05/2015   right  . Diabetic neuropathy ([place])   . ESRD (end stage renal disease) on dialysis ([place]) 09/28/2011-01/23/2013   M-W-F; [place]  . GERD (gastroesophageal reflux disease)   . High cholesterol   . History of blood transfusion    related to "kidney transplant"  . Hypertension    sees Dr. Carolin Guernsey  . Metabolic bone disease   . PAD (peripheral artery disease) ([place])   . Peripheral vascular disease ([place])   . Pneumonia 09/27/2011  . Retinopathy   . Type II diabetes mellitus ([place])    "controlled with diet"    Family History  Problem Relation Age of Onset  . Malignant hyperthermia Mother   . Thyroid disease Mother   . Hypertension Mother   . Malignant hyperthermia Father   . Hypertension Father   . Diabetes Brother   . Heart disease Brother        CHF  . Anesthesia problems Neg Hx     Past Surgical History:  Procedure Laterality Date  . ABOVE KNEE LEG AMPUTATION Right   . AMPUTATION Right 10/13/2015   Procedure: Right Transmetatarsal Amputation;  Surgeon: Newt Minion, MD;  Location: [facility];  Service: Orthopedics;  Laterality: Right;  . AMPUTATION Right 11/29/2015   Procedure: RIGHT BELOW KNEE AMPUTATION;  Surgeon: Newt Minion, MD;  Location: [facility];  Service: Orthopedics;  Laterality: Right;  . AMPUTATION FINGER     Rt hand middle finger  . AV FISTULA PLACEMENT  10/04/2011   Procedure: INSERTION OF ARTERIOVENOUS (AV) GORE-TEX GRAFT ARM;  Surgeon: Angelia Mould, MD;   Location: [facility];  Service: Vascular;  Laterality: Left;  Insertion left upper arm Arteriovenous goretex graft  . AV FISTULA PLACEMENT  10/29/2011   Procedure: ARTERIOVENOUS (AV) FISTULA CREATION;  Surgeon: Angelia Mould, MD;  Location[facility];  Service: Vascular;  Laterality: Right;  Creation Right Arteriovenous Fistula   . [place] REMOVAL  10/04/2011   Procedure: REMOVAL OF ARTERIOVENOUS GORETEX GRAFT ([place]);  Surgeon: Elam Dutch, MD;  Location: [facility];  Service: Vascular;  Laterality: Left;  . CHOLECYSTECTOMY N/A 12/08/2015   Procedure: LAPAROSCOPIC CHOLECYSTECTOMY WITH INTRAOPERATIVE CHOLANGIOGRAM;  Surgeon: Stark Klein, MD;  Location: [facility];  Service: General;  Laterality: N/A;  . ESOPHAGOGASTRODUODENOSCOPY N/A 12/24/2012   Procedure: ESOPHAGOGASTRODUODENOSCOPY (EGD);  Surgeon: Milus Banister, MD;  Location: [facility];  Service: Endoscopy;  Laterality: N/A;  . FINGER AMPUTATION Right 02/26/2013   "3rd finger"  . INSERTION  OF DIALYSIS CATHETER  10/04/2011   Procedure: INSERTION OF DIALYSIS CATHETER;  Surgeon: Angelia Mould, MD;  Location: [facility];  Service: Vascular;  Laterality: Right;  insertion of dialysis catheter right internal jugular  . INSERTION OF DIALYSIS CATHETER  06/23/2012   Procedure: INSERTION OF DIALYSIS CATHETER;  Surgeon: Angelia Mould, MD;  Location: [facility];  Service: Vascular;  Laterality: N/A;  Ultrasound guided  . KIDNEY TRANSPLANT  01/24/2013  . LOWER EXTREMITY ANGIOGRAPHY Left 08/12/2016   Procedure: Lower Extremity Angiography;  Surgeon: Algernon Huxley, MD;  Location: [facility];  Service: Cardiovascular;  Laterality: Left;  . LOWER EXTREMITY INTERVENTION  08/12/2016   Procedure: Lower Extremity Intervention;  Surgeon: Algernon Huxley, MD;  Location: [facility];  Service: Cardiovascular;;  . PATCH ANGIOPLASTY  06/23/2012   Procedure: PATCH ANGIOPLASTY;  Surgeon: Angelia Mould, MD;  Location: [facility];  Service: Vascular;  Laterality:  Right;  . PERIPHERAL VASCULAR CATHETERIZATION N/A 09/19/2015   Procedure: Lower Extremity Angiography;  Surgeon: Adrian Prows, MD;  Location: [facility];  Service: Cardiovascular;  Laterality: N/A;  . PERIPHERAL VASCULAR CATHETERIZATION N/A 09/19/2015   Procedure: Abdominal Aortogram;  Surgeon: Adrian Prows, MD;  Location: [facility];  Service: Cardiovascular;  Laterality: N/A;  . PERIPHERAL VASCULAR CATHETERIZATION Right 09/19/2015   Procedure: Peripheral Vascular Intervention;  Surgeon: Adrian Prows, MD;  Location: [facility];  Service: Cardiovascular;  Laterality: Right;  Right Common  Iliac  . PERIPHERAL VASCULAR CATHETERIZATION N/A 10/06/2015   Procedure: Lower Extremity Angiography;  Surgeon: Adrian Prows, MD;  Location: [facility];  Service: Cardiovascular;  Laterality: N/A;  . PERIPHERAL VASCULAR CATHETERIZATION  10/06/2015   Procedure: Peripheral Vascular Intervention;  Surgeon: Adrian Prows, MD;  Location: [facility];  Service: Cardiovascular;;  REIA  Omnilink 6.0x29, innova 7x80  RSFA innova 5x80  . REVISON OF ARTERIOVENOUS FISTULA  06/23/2012   Procedure: REVISON OF ARTERIOVENOUS FISTULA;  Surgeon: Angelia Mould, MD;  Location: [facility];  Service: Vascular;  Laterality: Right;  Ultrasound guided  . SHUNTOGRAM N/A 04/06/2012   Procedure: Earney Mallet;  Surgeon: Angelia Mould, MD;  Location: [facility];  Service: Cardiovascular;  Laterality: N/A;   Social History   Occupational History  . Not on file  Tobacco Use  . Smoking status: Never Smoker  . Smokeless tobacco: Never Used  Substance and Sexual Activity  . Alcohol use: No  . Drug use: No  . Sexual activity: Never

## 2018-07-16 DIAGNOSIS — Z940 Kidney transplant status: Secondary | ICD-10-CM | POA: Diagnosis not present

## 2018-07-28 ENCOUNTER — Telehealth (INDEPENDENT_AMBULATORY_CARE_PROVIDER_SITE_OTHER): Payer: Self-pay | Admitting: Orthopedic Surgery

## 2018-07-28 NOTE — Telephone Encounter (Signed)
07/15/18 ov note faxed to [facility] [phone], ph [phone] opt 1

## 2018-07-29 DIAGNOSIS — Z794 Long term (current) use of insulin: Secondary | ICD-10-CM | POA: Diagnosis not present

## 2018-07-29 DIAGNOSIS — E1165 Type 2 diabetes mellitus with hyperglycemia: Secondary | ICD-10-CM | POA: Diagnosis not present

## 2018-07-30 DIAGNOSIS — K219 Gastro-esophageal reflux disease without esophagitis: Secondary | ICD-10-CM | POA: Diagnosis not present

## 2018-07-30 DIAGNOSIS — I10 Essential (primary) hypertension: Secondary | ICD-10-CM | POA: Diagnosis not present

## 2018-07-30 DIAGNOSIS — J302 Other seasonal allergic rhinitis: Secondary | ICD-10-CM | POA: Diagnosis not present

## 2018-07-30 DIAGNOSIS — E785 Hyperlipidemia, unspecified: Secondary | ICD-10-CM | POA: Diagnosis not present

## 2018-07-30 DIAGNOSIS — E119 Type 2 diabetes mellitus without complications: Secondary | ICD-10-CM | POA: Diagnosis not present

## 2018-08-04 ENCOUNTER — Ambulatory Visit: Payer: Self-pay | Admitting: Cardiology

## 2018-08-04 DIAGNOSIS — G903 Multi-system degeneration of the autonomic nervous system: Secondary | ICD-10-CM | POA: Insufficient documentation

## 2018-08-04 DIAGNOSIS — R0989 Other specified symptoms and signs involving the circulatory and respiratory systems: Secondary | ICD-10-CM | POA: Insufficient documentation

## 2018-08-04 DIAGNOSIS — B259 Cytomegaloviral disease, unspecified: Secondary | ICD-10-CM | POA: Insufficient documentation

## 2018-08-04 DIAGNOSIS — D849 Immunodeficiency, unspecified: Secondary | ICD-10-CM | POA: Insufficient documentation

## 2018-08-04 DIAGNOSIS — D899 Disorder involving the immune mechanism, unspecified: Secondary | ICD-10-CM

## 2018-08-04 NOTE — Progress Notes (Deleted)
(  Laverda Page MD; 12/17/2017 6:04 AM) Patient words: 3 week f/u HTN w/ orthostatics; Last office visit 11/28/17.  The patient is a 48 year old female who presents for a Follow-up for Orthostatic hypotension.  Additional reasons for visit:  Follow-up for Hypertension is described as the following: [name] is an AAF with renal transplantation due to diabetic and hypertensive renal disease on 01/23/2013 at [facility] and followed by Dr. Elmarie Shiley. She has HTN, orthostatic hypotension due to diabetic neuropathy, diabetic retinopathy and severe PAD with right common iliac artery stenting on 09/19/2015, again on 10/06/2015 had stenting of the right external iliac artery but eventually underwent RAKA in 2017 and walks with prosthesis. She has had left SFA angioplasty in Feb 2019 by Dr. Lucky Cowboy in [place].  She presents for f/u of difficult to control hypertension. Continues to have severe dizziness occasinally if she stands up suddenly or prolonged time but states she is tolerating the medication without significant effect on her dizzy spells. Last office visit I had an extensive discussion regarding uncontrolled diabetes, weight gain and also hypertension. I will broaden to improve compliance at [facility], states that she has made lifestyle changes and has been strict with avoidance of high carbohydrate diet. She is lost about 4 pounds since then. Denies any chest pain or so breath.  [name] is an AAF with renal transplantation due to diabetic and hypertensive renal disease on 01/23/2013 at [facility] and followed by Dr. Elmarie Shiley. She has HTN, orthostatic hypotension due to diabetic neuropathy, diabetic retinopathy and severe PAD with right common iliac artery stenting on 09/19/2015, again on 10/06/2015 had stenting of the right external iliac artery but eventually underwent RAKA in 2017 and walks with prosthesis. She has had left SFA angioplasty in Feb 2019 by Dr. Lucky Cowboy in  [place].  She presents for f/u of difficult to control hypertension. Last office visit I had an extensive discussion regarding uncontrolled diabetes, weight gain and also hypertension. She has made lifestyle changes and has been strict with avoidance of high carbohydrate diet. She is lost about 4 pounds since then. Denies any chest pain or so breath. BP now has improved and she also noticed improvement in blood sugar and brings her diary. Although her sitting blood pressure is elevated, due to severe orthostatic hypotension, no changes in the medications were done today. I will see her back in 6 months or sooner if problems.  CC Dr. Benito Mccreedy. (PCP)

## 2018-08-27 DIAGNOSIS — Z794 Long term (current) use of insulin: Secondary | ICD-10-CM | POA: Diagnosis not present

## 2018-08-27 DIAGNOSIS — E1165 Type 2 diabetes mellitus with hyperglycemia: Secondary | ICD-10-CM | POA: Diagnosis not present

## 2018-09-01 NOTE — Progress Notes (Signed)
Subjective:  Primary Physician:  Benito Mccreedy, MD  Patient ID: [name], female    DOB: [birth year], 48 y.o.   MRN: [id number]  Chief Complaint  Patient presents with  . PAD  . Follow-up    HPI: [name]  is a 48 y.o. female  with renal transplantation due to diabetic and hypertensive renal disease on 01/23/2013 at [facility] and followed by Dr. Elmarie Shiley. She has HTN, orthostatic hypotension due to diabetic neuropathy, diabetic retinopathy and severe PAD with right common iliac artery stenting on 09/19/2015, again on 10/06/2015 had stenting of the right external iliac artery but eventually underwent RAKA in 2017 and walks with prosthesis. She has had left SFA angioplasty in Feb 2019 by Dr. Lucky Cowboy in [place].  She presents for f/u of difficult to control hypertension. Continues to have severe dizziness occasinally if she stands up suddenly or prolonged time but states she is tolerating the medication without significant effect on her dizzy spells. She is now on insulin pump, since then diabetes for the 1st time is well controlled.  Since being on BiDil, blood pressure is also been well-controlled.  ED 06/28/2018 visit for near syncope: feeling very lightheaded with a blurry vision then reports falling down. She endorses LOC, and reports waking up seating up surrounded by her friends. No hypoxia, denies any shortness of breath, slightly elevated HR but within normal range for patient.  chest xray showed: no consolidation, no pleural effusion, no pneumothorax. CT Head w/o obtained to r/o any acute process or hemorrhage: No acute process at this time. Trop I, 0.01. EKG Sinus Rhythm.   Past Medical History:  Diagnosis Date  . Anemia   . CHF (congestive heart failure) ([place])   . Critical ischemia of lower extremity hospitalized 10/05/2015   right  . Diabetic neuropathy ([place])   . ESRD (end stage renal disease) on dialysis ([place]) 09/28/2011-01/23/2013   M-W-F; [place]  .  GERD (gastroesophageal reflux disease)   . High cholesterol   . History of blood transfusion    related to "kidney transplant"  . Hypertension    sees Dr. Carolin Guernsey  . Metabolic bone disease   . PAD (peripheral artery disease) ([place])   . Peripheral vascular disease ([place])   . Pneumonia 09/27/2011  . Retinopathy   . Type II diabetes mellitus ([place])    "controlled with diet"    Past Surgical History:  Procedure Laterality Date  . ABOVE KNEE LEG AMPUTATION Right   . AMPUTATION Right 10/13/2015   Procedure: Right Transmetatarsal Amputation;  Surgeon: Newt Minion, MD;  Location: [place];  Service: Orthopedics;  Laterality: Right;  . AMPUTATION Right 11/29/2015   Procedure: RIGHT BELOW KNEE AMPUTATION;  Surgeon: Newt Minion, MD;  Location: [place];  Service: Orthopedics;  Laterality: Right;  . AMPUTATION FINGER     Rt hand middle finger  . AV FISTULA PLACEMENT  10/04/2011   Procedure: INSERTION OF ARTERIOVENOUS (AV) GORE-TEX GRAFT ARM;  Surgeon: Angelia Mould, MD;  Location: [place];  Service: Vascular;  Laterality: Left;  Insertion left upper arm Arteriovenous goretex graft  . AV FISTULA PLACEMENT  10/29/2011   Procedure: ARTERIOVENOUS (AV) FISTULA CREATION;  Surgeon: Angelia Mould, MD;  Location: [facility] OR;  Service: Vascular;  Laterality: Right;  Creation Right Arteriovenous Fistula   . [place] REMOVAL  10/04/2011   Procedure: REMOVAL OF ARTERIOVENOUS GORETEX GRAFT ([place]);  Surgeon: Elam Dutch, MD;  Location: [place];  Service:  Vascular;  Laterality: Left;  . CHOLECYSTECTOMY N/A 12/08/2015   Procedure: LAPAROSCOPIC CHOLECYSTECTOMY WITH INTRAOPERATIVE CHOLANGIOGRAM;  Surgeon: Stark Klein, MD;  Location: [place];  Service: General;  Laterality: N/A;  . ESOPHAGOGASTRODUODENOSCOPY N/A 12/24/2012   Procedure: ESOPHAGOGASTRODUODENOSCOPY (EGD);  Surgeon: Milus Banister, MD;  Location: [place];  Service: Endoscopy;  Laterality: N/A;  . FINGER AMPUTATION Right 02/26/2013   "3rd finger"   . INSERTION OF DIALYSIS CATHETER  10/04/2011   Procedure: INSERTION OF DIALYSIS CATHETER;  Surgeon: Angelia Mould, MD;  Location: [place];  Service: Vascular;  Laterality: Right;  insertion of dialysis catheter right internal jugular  . INSERTION OF DIALYSIS CATHETER  06/23/2012   Procedure: INSERTION OF DIALYSIS CATHETER;  Surgeon: Angelia Mould, MD;  Location: [place];  Service: Vascular;  Laterality: N/A;  Ultrasound guided  . KIDNEY TRANSPLANT  01/24/2013  . LOWER EXTREMITY ANGIOGRAPHY Left 08/12/2016   Procedure: Lower Extremity Angiography;  Surgeon: Algernon Huxley, MD;  Location: [place] CV [facility];  Service: Cardiovascular;  Laterality: Left;  . LOWER EXTREMITY INTERVENTION  08/12/2016   Procedure: Lower Extremity Intervention;  Surgeon: Algernon Huxley, MD;  Location: [place] [facility];  Service: Cardiovascular;;  . PATCH ANGIOPLASTY  06/23/2012   Procedure: PATCH ANGIOPLASTY;  Surgeon: Angelia Mould, MD;  Location: [place];  Service: Vascular;  Laterality: Right;  . PERIPHERAL VASCULAR CATHETERIZATION N/A 09/19/2015   Procedure: Lower Extremity Angiography;  Surgeon: Adrian Prows, MD;  Location: [place] [facility];  Service: Cardiovascular;  Laterality: N/A;  . PERIPHERAL VASCULAR CATHETERIZATION N/A 09/19/2015   Procedure: Abdominal Aortogram;  Surgeon: Adrian Prows, MD;  Location: [place] [facility];  Service: Cardiovascular;  Laterality: N/A;  . PERIPHERAL VASCULAR CATHETERIZATION Right 09/19/2015   Procedure: Peripheral Vascular Intervention;  Surgeon: Adrian Prows, MD;  Location: [place] [facility];  Service: Cardiovascular;  Laterality: Right;  Right Common  Iliac  . PERIPHERAL VASCULAR CATHETERIZATION N/A 10/06/2015   Procedure: Lower Extremity Angiography;  Surgeon: Adrian Prows, MD;  Location: [place] [facility];  Service: Cardiovascular;  Laterality: N/A;  . PERIPHERAL VASCULAR CATHETERIZATION  10/06/2015   Procedure: Peripheral Vascular Intervention;  Surgeon: Adrian Prows, MD;  Location:  [place] [facility];  Service: Cardiovascular;;  REIA  Omnilink 6.0x29, innova 7x80  RSFA innova 5x80  . REVISON OF ARTERIOVENOUS FISTULA  06/23/2012   Procedure: REVISON OF ARTERIOVENOUS FISTULA;  Surgeon: Angelia Mould, MD;  Location: [place];  Service: Vascular;  Laterality: Right;  Ultrasound guided  . SHUNTOGRAM N/A 04/06/2012   Procedure: Earney Mallet;  Surgeon: Angelia Mould, MD;  Location: [facility] C[facility] LAB;  Service: Cardiovascular;  Laterality: N/A;    Social History   Socioeconomic History  . Marital status: Single    Spouse name: Not on file  . Number of children: 0  . Years of education: Not on file  . Highest education level: Not on file  Occupational History  . Not on file  Social Needs  . Financial resource strain: Not on file  . Food insecurity:    Worry: Not on file    Inability: Not on file  . Transportation needs:    Medical: Not on file    Non-medical: Not on file  Tobacco Use  . Smoking status: Never Smoker  . Smokeless tobacco: Never Used  Substance and Sexual Activity  . Alcohol use: No  . Drug use: No  . Sexual activity: Never  Lifestyle  . Physical activity:  Days per week: Not on file    Minutes per session: Not on file  . Stress: Not on file  Relationships  . Social connections:    Talks on phone: Not on file    Gets together: Not on file    Attends religious service: Not on file    Active member of club or organization: Not on file    Attends meetings of clubs or organizations: Not on file    Relationship status: Not on file  . Intimate partner violence:    Fear of current or ex partner: Not on file    Emotionally abused: Not on file    Physically abused: Not on file    Forced sexual activity: Not on file  Other Topics Concern  . Not on file  Social History Narrative  . Not on file    Current Outpatient Medications on File Prior to Visit  Medication Sig Dispense Refill  . albuterol (PROVENTIL HFA;VENTOLIN HFA) 108 (90 BASE)  MCG/ACT inhaler Inhale 1 puff into the lungs every 6 (six) hours as needed for wheezing or shortness of breath.    Marland Kitchen amLODipine (NORVASC) 10 MG tablet Take 10 mg by mouth at bedtime.     . fluticasone (FLONASE) 50 MCG/ACT nasal spray 2 sprays by Each Nare route daily for 30 days.    . furosemide (LASIX) 40 MG tablet Take 40 mg by mouth daily.     . insulin aspart (NOVOLOG) 100 UNIT/ML injection Inject 0-16 Units into the skin. Pt on insulin pump.  Total of 16 units per day.    . labetalol (NORMODYNE) 300 MG tablet Take 300 mg by mouth at bedtime.   3  . mycophenolate (MYFORTIC) 180 MG EC tablet Take 540 mg by mouth 2 (two) times daily. (540mg ) in the morning and bedtime-( 3 capsules )    . nitrofurantoin, macrocrystal-monohydrate, (MACROBID) 100 MG capsule     . pantoprazole (PROTONIX) 40 MG tablet Take 40 mg by mouth at bedtime.     . pravastatin (PRAVACHOL) 20 MG tablet Take 20 mg by mouth at bedtime.     . sulfamethoxazole-trimethoprim (BACTRIM,SEPTRA) 400-80 MG per tablet Take 1 tablet by mouth every Monday, Wednesday, and Friday.     . tacrolimus (PROGRAF) 1 MG capsule Take 2 mg by mouth 2 (two) times daily.     . betamethasone dipropionate (DIPROLENE) 0.05 % cream   3  . insulin detemir (LEVEMIR) 100 UNIT/ML injection Inject 0.06 mLs (6 Units total) into the skin at bedtime. (Patient not taking: Reported on 09/02/2018) 10 mL 0  . isosorbide-hydrALAZINE (BIDIL) 20-37.5 MG tablet Take 1 tablet by mouth daily.      Current Facility-Administered Medications on File Prior to Visit  Medication Dose Route Frequency Provider Last Rate Last Dose  . 0.9 %  sodium chloride infusion   Intravenous Continuous Dew, Erskine Squibb, MD      . ceFAZolin (ANCEF) IVPB 1 g/50 mL premix  1 g Intravenous Once Algernon Huxley, MD        Review of Systems  Constitutional: Negative for malaise/fatigue and weight loss.  Respiratory: Negative for cough, hemoptysis and shortness of breath.   Cardiovascular: Positive for  claudication (Night Cramps). Negative for chest pain, palpitations and leg swelling.  Gastrointestinal: Negative for abdominal pain, blood in stool, constipation, heartburn and vomiting.  Genitourinary: Negative for dysuria.  Musculoskeletal: Negative for joint pain and myalgias.  Neurological: Positive for dizziness. Negative for focal weakness and headaches.  Endo/Heme/Allergies:  Does not bruise/bleed easily.  Psychiatric/Behavioral: Negative for depression. The patient is not nervous/anxious.   All other systems reviewed and are negative.     Objective:  Height 5' (1.524 m), weight 162 lb (73.5 kg), SpO2 97 %. Body mass index is 31.64 kg/m. 09/02/18 2:01 PM  09/02/18 2:08 PM  09/02/18 2:09 PM      BP  167/83   156/81   135/79    BP Location  Left Arm   Left Arm   Left Arm    Patient Position  Supine   Sitting   Standing    Cuff Size  Normal   Normal   Normal    Pulse  91   83   93    SpO2  98%   98%   97%    Weight  162 lb 14.4 oz (73.9 kg)      Height  5\' 5"  (1.651 m)         Physical Exam  Constitutional: She appears well-developed and well-nourished. No distress.  HENT:  Head: Atraumatic.  Eyes: Conjunctivae are normal.  Neck: Neck supple. No JVD present. No thyromegaly present.  Cardiovascular: Normal rate, regular rhythm, S1 normal, S2 normal and intact distal pulses. Exam reveals no gallop.  Murmur heard.  Midsystolic murmur is present with a grade of 2/6. Aortic Area Pulses:      Carotid pulses are on the right side with bruit and on the left side with bruit.      Femoral pulses are on the right side with bruit and on the left side with bruit.      Popliteal pulses are 2+ on the left side.       Dorsalis pedis pulses are 1+ on the left side.       Posterior tibial pulses are 0 on the left side.  Right BKA noted  Pulmonary/Chest: Effort normal and breath sounds normal.  Abdominal: Soft. Bowel sounds are normal.  Epigastric bruit present, No prominent abdominal aortic  pulsation.  Musculoskeletal: Normal range of motion.        General: Deformity (right BKA) present. No edema.  Neurological: She is alert.  Skin: Skin is warm and dry.  Lower Extremity Inspection - Left - Loss of hair and Shiny atrophic skin, No Ulcerations. Right - Inspection Normal(Right below knee amputation, stump has healed well). Palpation - Temperature - Left - Cool. Right - Normal.  Psychiatric: She has a normal mood and affect.   CARDIAC STUDIES:   Peripheral arteriogram 08/12/2016: Percutaneous transluminal angioplasty of the entire left SFA and proximal popliteal artery with 4 mm diameter Lutonix drug-coated angioplasty balloons   Carotid artery duplex 10/29/2016: No hemodynamically significant arterial disease in the internal carotid artery bilaterally. No significnat plaque burden noted bilaterally. Antegrade right vertebral artery flow. Antegrade left vertebral artery flow. No significant change from Carotid duplex 08/20/12.  PV Angio 10/06/2015: S/P Right EIA 6x39 and 6x60 mm balloon expandable and self expanding stent, 90% to 0%. Right SFA 85% to 0% with 5x80 mm Self expanding stent. Two vessel r/o with AT and diffusely disease Peroneal. No runoff at the ankle. Filled by faint collaterals. Right CIA stent placed 09/19/15, 6x14 mm stent patent.  [employer] study, Renal duplex 09/07/2013: Patent transplant vasculature, resistive indicis are increasing. The renal artery velocity at the anastomosis has increased from prior study, However this may indicate elevated velocity in the proximal iliac artery rather than presence of true stenosis.  Echocardiogram 03/30/2011: Reveals normal  LV systolic function. Mild diastolic dysfunction. Trivial pericardial effusion.LVH.   Recent Labs:   06/28/18 Troponin i, poc 0.00 - 0.08 ng/mL 0.01     CMP Latest Ref Rng & Units 06/28/2018  Glucose 70 - 99 mg/dL 102(H)  BUN 6 - 20 mg/dL 17  Creatinine 0.44 - 1.00 mg/dL 1.01(H)  Sodium 135 -  145 mmol/L 139  Potassium 3.5 - 5.1 mmol/L 4.0  Chloride 98 - 111 mmol/L 108  CO2 22 - 32 mmol/L 23  Calcium 8.9 - 10.3 mg/dL 9.0  Total Protein 6.5 - 8.1 g/dL 6.5  Total Bilirubin 0.3 - 1.2 mg/dL 0.4  Alkaline Phos 38 - 126 U/L 39  AST 15 - 41 U/L 19  ALT 0 - 44 U/L 12   CBC Latest Ref Rng & Units 06/28/2018  WBC 4.0 - 10.5 K/uL 3.1(L)  Hemoglobin 12.0 - 15.0 g/dL 11.4(L)  Hematocrit 36.0 - 46.0 % 38.5  Platelets 150 - 400 K/uL 152    11/04/2017: Cholesterol 187, triglycerides 86, HDL 84, LDL 86. PTH 173. Vitamin D 28.  07/03/2017: Magnesium 1.6. Iron binding capacity slightly low at 240, otherwise iron studies normal. Ferritin elevated at 1436.  Assessment & Recommendations:   Orthostatic hypotension  Peripheral artery disease (HCC)  History of right below knee amputation ([place])  Essential hypertension  Hypercholesteremia - Plan: Lipid panel, CANCELED: Lipid Profile  Controlled type 2 diabetes mellitus with diabetic peripheral angiopathy without gangrene, with long-term current use of insulin ([place]) - Plan: Lipid panel  Renal transplant recipient   ED EKG 06/28/2018: Sinus rhythm.   EKG 10/22/2017: Normal sinus rhythm at rate of 91 bpm, left atrial enlargement, no evidence of ischemia. Normal QT interval.  Recommendation:   Kamera Dubas is an AAF with renal transplantation due to diabetic and hypertensive renal disease on 01/23/2013 at [facility] and followed by Dr. Elmarie Shiley. She has HTN, orthostatic hypotension due to diabetic neuropathy, diabetic retinopathy and severe PAD with right common iliac artery stenting on 09/19/2015, again on 10/06/2015 had stenting of the right external iliac artery but eventually underwent RAKA in 2017 and walks with prosthesis. She has had left SFA angioplasty in Feb 2019 by Dr. Lucky Cowboy in [place]. Although blood pressure is slightly elevated, she essentially remains asymptomatic without significant dizziness and tolerating present  medications.  Hence no changes were done.  EKG does not reveal any significant abnormality.  No change in physical exam, would avoid peripheral arteriogram in view of renal status.  There is no limb threatening ischemic signs although she has chronic mild ischemic changes in her left leg and right leg BKA stump has healed well. I'd like to obtain lipid profile testing. Uncontrolled to follow her annual.  She is advised to contact me if she were to develop any signs and symptoms of worsening pain in her left leg, ulceration and left leg, she is also advised to keep her left leg clean.  Fortunately since being on insulin pump, diabetes is now improved.  Recent A1c at goal.  Adrian Prows, MD, [facility] 09/02/2018, 2:48 PM [facility] [place] Pager: [phone] Office: [phone] If no answer Cell [phone]

## 2018-09-02 ENCOUNTER — Encounter: Payer: Self-pay | Admitting: Cardiology

## 2018-09-02 ENCOUNTER — Other Ambulatory Visit: Payer: Self-pay

## 2018-09-02 ENCOUNTER — Ambulatory Visit (INDEPENDENT_AMBULATORY_CARE_PROVIDER_SITE_OTHER): Payer: Medicare Other | Admitting: Cardiology

## 2018-09-02 VITALS — Ht 60.0 in | Wt 162.0 lb

## 2018-09-02 DIAGNOSIS — E7800 Pure hypercholesterolemia, unspecified: Secondary | ICD-10-CM | POA: Diagnosis not present

## 2018-09-02 DIAGNOSIS — E1151 Type 2 diabetes mellitus with diabetic peripheral angiopathy without gangrene: Secondary | ICD-10-CM

## 2018-09-02 DIAGNOSIS — I739 Peripheral vascular disease, unspecified: Secondary | ICD-10-CM

## 2018-09-02 DIAGNOSIS — I10 Essential (primary) hypertension: Secondary | ICD-10-CM | POA: Diagnosis not present

## 2018-09-02 DIAGNOSIS — Z89511 Acquired absence of right leg below knee: Secondary | ICD-10-CM

## 2018-09-02 DIAGNOSIS — I951 Orthostatic hypotension: Secondary | ICD-10-CM | POA: Diagnosis not present

## 2018-09-02 DIAGNOSIS — Z794 Long term (current) use of insulin: Secondary | ICD-10-CM

## 2018-09-02 DIAGNOSIS — Z940 Kidney transplant status: Secondary | ICD-10-CM

## 2018-09-03 ENCOUNTER — Encounter: Payer: Self-pay | Admitting: Cardiology

## 2018-09-03 DIAGNOSIS — I739 Peripheral vascular disease, unspecified: Secondary | ICD-10-CM | POA: Diagnosis not present

## 2018-09-03 DIAGNOSIS — Z794 Long term (current) use of insulin: Secondary | ICD-10-CM | POA: Diagnosis not present

## 2018-09-03 DIAGNOSIS — E7800 Pure hypercholesterolemia, unspecified: Secondary | ICD-10-CM | POA: Diagnosis not present

## 2018-09-03 DIAGNOSIS — L84 Corns and callosities: Secondary | ICD-10-CM | POA: Diagnosis not present

## 2018-09-03 DIAGNOSIS — M2042 Other hammer toe(s) (acquired), left foot: Secondary | ICD-10-CM | POA: Diagnosis not present

## 2018-09-03 DIAGNOSIS — E1151 Type 2 diabetes mellitus with diabetic peripheral angiopathy without gangrene: Secondary | ICD-10-CM | POA: Diagnosis not present

## 2018-09-04 LAB — LIPID PANEL
Chol/HDL Ratio: 3.5 ratio (ref 0.0–4.4)
Cholesterol, Total: 176 mg/dL (ref 100–199)
HDL: 51 mg/dL (ref >39)
LDL Calculated: 107 mg/dL — ABNORMAL HIGH (ref 0–99)
Triglycerides: 88 mg/dL (ref 0–149)
VLDL Cholesterol Cal: 18 mg/dL (ref 5–40)

## 2018-09-08 ENCOUNTER — Other Ambulatory Visit: Payer: Self-pay

## 2018-09-08 MED ORDER — ROSUVASTATIN CALCIUM 20 MG PO TABS: 20.0000 mg | ORAL_TABLET | Freq: Every day | ORAL | 0 refills | Status: DC

## 2018-09-08 NOTE — Progress Notes (Signed)
Please stop pravastatin and change to Crestor 20mg  daily

## 2018-09-10 DIAGNOSIS — I10 Essential (primary) hypertension: Secondary | ICD-10-CM | POA: Diagnosis not present

## 2018-09-10 DIAGNOSIS — N3001 Acute cystitis with hematuria: Secondary | ICD-10-CM | POA: Diagnosis not present

## 2018-09-10 DIAGNOSIS — K219 Gastro-esophageal reflux disease without esophagitis: Secondary | ICD-10-CM | POA: Diagnosis not present

## 2018-09-10 DIAGNOSIS — E119 Type 2 diabetes mellitus without complications: Secondary | ICD-10-CM | POA: Diagnosis not present

## 2018-09-10 DIAGNOSIS — E785 Hyperlipidemia, unspecified: Secondary | ICD-10-CM | POA: Diagnosis not present

## 2018-09-15 ENCOUNTER — Encounter: Payer: Self-pay | Admitting: Cardiology

## 2018-09-15 NOTE — Progress Notes (Signed)
Virtual Visit via Video Note: This visit type was conducted due to national recommendations for restrictions regarding the COVID-19 Pandemic (e.g. social distancing).  This format is felt to be most appropriate for this patient at this time.  All issues noted in this document were discussed and addressed.  No physical exam was performed (except for noted visual exam findings with Telehealth visits).  The patient has consented to conduct a Telehealth visit and understands insurance will be billed.   I connected with@, on 09/16/18 at  by a video enabled telemedicine application and verified that I am speaking with the correct person using two identifiers.   I discussed the limitations of evaluation and management by telemedicine and the availability of in person appointments. The patient expressed understanding and agreed to proceed.   I have discussed with patient regarding the safety during COVID Pandemic and steps and precautions to be taken including social distancing, frequent hand wash and use of detergent soap, gels with the patient. I asked the patient to avoid touching mouth, nose, eyes, ears with her hands. I encouraged regular walking around the neighborhood and exercise and regular diet, as long as social distancing can be maintained.   Subjective:  Primary Physician:  Benito Mccreedy, MD  Patient ID: [name], female    DOB: 19-Dec-1970, 48 y.o.   MRN: [id number]  Chief Complaint  Patient presents with  . Follow-up    Kidney Transplant-See if able to return  to work    HPI: [name]  is a 48 y.o. female  with renal transplantation due to diabetic and hypertensive renal disease on 01/23/2013 at [facility] and followed by Dr. Elmarie Shiley. She has HTN, orthostatic hypotension due to diabetic neuropathy, diabetic retinopathy and severe PAD with right common iliac artery stenting on 09/19/2015, again on 10/06/2015 had stenting of the right external iliac artery but  eventually underwent RAKA in 2017 and walks with prosthesis. She has had left SFA angioplasty in Feb 2019 by Dr. Lucky Cowboy in [place].  She presents for f/u of difficult to control hypertension and hyperlipidemia. Continues to have severe dizziness occasinally if she stands up suddenly or prolonged time but states she is tolerating the medication without significant effect on her dizzy spells. She is now on insulin pump, since then diabetes is well controlled.  Since being on BiDil, blood pressure is also been well-controlledhim a cost has been an issue.  She was seen in the emergency room on 06/28/2018 with syncope felt to be due to dehydration.  She has not had any recurrence. I performed lipid profile testing.   Past Medical History:  Diagnosis Date  . Anemia   . CHF (congestive heart failure) ([place])   . Critical lower limb ischemia 10/05/2015  . GERD (gastroesophageal reflux disease)   . High cholesterol   . History of blood transfusion    related to "kidney transplant"  . Hypercholesteremia 08/14/2006   Qualifier: Diagnosis of  By: Eusebio Friendly    . Mechanical complication of other vascular device, implant, and graft 09/22/2012  . Metabolic bone disease   . PAD (peripheral artery disease) ([place])   . Pericardial effusion   . Retinopathy   . Type II diabetes mellitus ([place])    "controlled with diet"    Past Surgical History:  Procedure Laterality Date  . ABOVE KNEE LEG AMPUTATION Right   . AMPUTATION Right 10/13/2015   Procedure: Right Transmetatarsal Amputation;  Surgeon: Newt Minion, MD;  Location: [facility]  OR;  Service: Orthopedics;  Laterality: Right;  . AMPUTATION Right 11/29/2015   Procedure: RIGHT BELOW KNEE AMPUTATION;  Surgeon: Newt Minion, MD;  Location: [place];  Service: Orthopedics;  Laterality: Right;  . AMPUTATION FINGER     Rt hand middle finger  . AV FISTULA PLACEMENT  10/04/2011   Procedure: INSERTION OF ARTERIOVENOUS (AV) GORE-TEX GRAFT ARM;  Surgeon: Angelia Mould, MD;  Location: [place];  Service: Vascular;  Laterality: Left;  Insertion left upper arm Arteriovenous goretex graft  . AV FISTULA PLACEMENT  10/29/2011   Procedure: ARTERIOVENOUS (AV) FISTULA CREATION;  Surgeon: Angelia Mould, MD;  Location: [facility] OR;  Service: Vascular;  Laterality: Right;  Creation Right Arteriovenous Fistula   . [place] REMOVAL  10/04/2011   Procedure: REMOVAL OF ARTERIOVENOUS GORETEX GRAFT ([place]);  Surgeon: Elam Dutch, MD;  Location: [place];  Service: Vascular;  Laterality: Left;  . CHOLECYSTECTOMY N/A 12/08/2015   Procedure: LAPAROSCOPIC CHOLECYSTECTOMY WITH INTRAOPERATIVE CHOLANGIOGRAM;  Surgeon: Stark Klein, MD;  Location: [place];  Service: General;  Laterality: N/A;  . ESOPHAGOGASTRODUODENOSCOPY N/A 12/24/2012   Procedure: ESOPHAGOGASTRODUODENOSCOPY (EGD);  Surgeon: Milus Banister, MD;  Location: [place];  Service: Endoscopy;  Laterality: N/A;  . FINGER AMPUTATION Right 02/26/2013   "3rd finger"  . INSERTION OF DIALYSIS CATHETER  10/04/2011   Procedure: INSERTION OF DIALYSIS CATHETER;  Surgeon: Angelia Mould, MD;  Location: [place];  Service: Vascular;  Laterality: Right;  insertion of dialysis catheter right internal jugular  . INSERTION OF DIALYSIS CATHETER  06/23/2012   Procedure: INSERTION OF DIALYSIS CATHETER;  Surgeon: Angelia Mould, MD;  Location: [place];  Service: Vascular;  Laterality: N/A;  Ultrasound guided  . KIDNEY TRANSPLANT  01/24/2013  . LOWER EXTREMITY ANGIOGRAPHY Left 08/12/2016   Procedure: Lower Extremity Angiography;  Surgeon: Algernon Huxley, MD;  Location: [place] [facility];  Service: Cardiovascular;  Laterality: Left;  . LOWER EXTREMITY INTERVENTION  08/12/2016   Procedure: Lower Extremity Intervention;  Surgeon: Algernon Huxley, MD;  Location: [place] [facility];  Service: Cardiovascular;;  . PATCH ANGIOPLASTY  06/23/2012   Procedure: PATCH ANGIOPLASTY;  Surgeon: Angelia Mould, MD;  Location: [place];  Service: Vascular;   Laterality: Right;  . PERIPHERAL VASCULAR CATHETERIZATION N/A 09/19/2015   Procedure: Lower Extremity Angiography;  Surgeon: Adrian Prows, MD;  Location: [place] [facility];  Service: Cardiovascular;  Laterality: N/A;  . PERIPHERAL VASCULAR CATHETERIZATION N/A 09/19/2015   Procedure: Abdominal Aortogram;  Surgeon: Adrian Prows, MD;  Location: [place] [facility];  Service: Cardiovascular;  Laterality: N/A;  . PERIPHERAL VASCULAR CATHETERIZATION Right 09/19/2015   Procedure: Peripheral Vascular Intervention;  Surgeon: Adrian Prows, MD;  Location: [place] [facility];  Service: Cardiovascular;  Laterality: Right;  Right Common  Iliac  . PERIPHERAL VASCULAR CATHETERIZATION N/A 10/06/2015   Procedure: Lower Extremity Angiography;  Surgeon: Adrian Prows, MD;  Location: [place] [facility];  Service: Cardiovascular;  Laterality: N/A;  . PERIPHERAL VASCULAR CATHETERIZATION  10/06/2015   Procedure: Peripheral Vascular Intervention;  Surgeon: Adrian Prows, MD;  Location: [place] [facility];  Service: Cardiovascular;;  REIA  Omnilink 6.0x29, innova 7x80  RSFA innova 5x80  . REVISON OF ARTERIOVENOUS FISTULA  06/23/2012   Procedure: REVISON OF ARTERIOVENOUS FISTULA;  Surgeon: Angelia Mould, MD;  Location: [place];  Service: Vascular;  Laterality: Right;  Ultrasound guided  . SHUNTOGRAM N/A 04/06/2012   Procedure: Earney Mallet;  Surgeon: Angelia Mould, MD;  [facility]  CATH [facility];  Service: Cardiovascular;  Laterality: N/A;    Social History   Socioeconomic History  . Marital status: Single    Spouse name: Not on file  . Number of children: 0  . Years of education: Not on file  . Highest education level: Not on file  Occupational History  . Not on file  Social Needs  . Financial resource strain: Not on file  . Food insecurity:    Worry: Not on file    Inability: Not on file  . Transportation needs:    Medical: Not on file    Non-medical: Not on file  Tobacco Use  . Smoking status: Never Smoker  . Smokeless  tobacco: Never Used  Substance and Sexual Activity  . Alcohol use: No  . Drug use: No  . Sexual activity: Never  Lifestyle  . Physical activity:    Days per week: Not on file    Minutes per session: Not on file  . Stress: Not on file  Relationships  . Social connections:    Talks on phone: Not on file    Gets together: Not on file    Attends religious service: Not on file    Active member of club or organization: Not on file    Attends meetings of clubs or organizations: Not on file    Relationship status: Not on file  . Intimate partner violence:    Fear of current or ex partner: Not on file    Emotionally abused: Not on file    Physically abused: Not on file    Forced sexual activity: Not on file  Other Topics Concern  . Not on file  Social History Narrative  . Not on file    Current Outpatient Medications on File Prior to Visit  Medication Sig Dispense Refill  . albuterol (PROVENTIL HFA;VENTOLIN HFA) 108 (90 BASE) MCG/ACT inhaler Inhale 1 puff into the lungs every 6 (six) hours as needed for wheezing or shortness of breath.    Marland Kitchen amLODipine (NORVASC) 10 MG tablet Take 10 mg by mouth at bedtime.     . betamethasone dipropionate (DIPROLENE) 0.05 % cream   3  . furosemide (LASIX) 40 MG tablet Take 40 mg by mouth daily.     . insulin aspart (NOVOLOG) 100 UNIT/ML injection Inject 0-16 Units into the skin. Pt on insulin pump.  Total of 16 units per day.    . isosorbide-hydrALAZINE (BIDIL) 20-37.5 MG tablet Take 1 tablet by mouth daily.     Marland Kitchen labetalol (NORMODYNE) 300 MG tablet Take 300 mg by mouth at bedtime.   3  . mycophenolate (MYFORTIC) 180 MG EC tablet Take 540 mg by mouth 2 (two) times daily. (540mg ) in the morning and bedtime-( 3 capsules )    . pantoprazole (PROTONIX) 40 MG tablet Take 40 mg by mouth at bedtime.     . sulfamethoxazole-trimethoprim (BACTRIM,SEPTRA) 400-80 MG per tablet Take 1 tablet by mouth every Monday, Wednesday, and Friday.     . tacrolimus (PROGRAF) 1  MG capsule Take 2 mg by mouth 2 (two) times daily.      Current Facility-Administered Medications on File Prior to Visit  Medication Dose Route Frequency Provider Last Rate Last Dose  . 0.9 %  sodium chloride infusion   Intravenous Continuous Dew, Erskine Squibb, MD      . ceFAZolin (ANCEF) IVPB 1 g/50 mL premix  1 g Intravenous Once Dew, Erskine Squibb, MD        Review  of Systems  Constitutional: Negative for malaise/fatigue and weight loss.  Respiratory: Negative for cough, hemoptysis and shortness of breath.   Cardiovascular: Positive for claudication (Night Cramps). Negative for chest pain, palpitations and leg swelling.  Gastrointestinal: Negative for abdominal pain, blood in stool, constipation, heartburn and vomiting.  Genitourinary: Negative for dysuria.  Musculoskeletal: Negative for joint pain and myalgias.  Neurological: Positive for dizziness. Negative for focal weakness and headaches.  Endo/Heme/Allergies: Does not bruise/bleed easily.  Psychiatric/Behavioral: Negative for depression. The patient is not nervous/anxious.   All other systems reviewed and are negative.  Height 5' (1.524 m), weight 158 lb (71.7 kg).     Objective:  Height 5' (1.524 m), weight 158 lb (71.7 kg). Body mass index is 30.86 kg/m. 09/02/18 2:01 PM  09/02/18 2:08 PM  09/02/18 2:09 PM      BP  167/83   156/81   135/79    BP Location  Left Arm   Left Arm   Left Arm    Patient Position  Supine   Sitting   Standing    Cuff Size  Normal   Normal   Normal    Pulse  91   83   93    SpO2  98%   98%   97%    Weight  162 lb 14.4 oz (73.9 kg)      Height  5\' 5"  (1.651 m)         Physical Exam  Constitutional: She is oriented to person, place, and time. She appears well-nourished.  No acute distress  Eyes: Conjunctivae are normal.  Neck: Neck supple.  Pulmonary/Chest: Effort normal.  Neurological: She is alert and oriented to person, place, and time.   BMP Recent Labs    06/28/18 1207  NA 139  K 4.0  CL 108   CO2 23  GLUCOSE 102*  BUN 17  CREATININE 1.01*  CALCIUM 9.0  GFRNONAA >60  GFRAA >60   CBC Latest Ref Rng & Units 06/28/2018 04/26/2016 04/12/2016  WBC 4.0 - 10.5 K/uL 3.1(L) - -  Hemoglobin 12.0 - 15.0 g/dL 11.4(L) 12.6 12.1  Hematocrit 36.0 - 46.0 % 38.5 - -  Platelets 150 - 400 K/uL 152 - -    HEMOGLOBIN A1C Lab Results  Component Value Date   HGBA1C 8.3 (H) 10/13/2015   MPG 192 10/13/2015   Lipid Panel     Component Value Date/Time   CHOL 176 09/03/2018 1001   TRIG 88 09/03/2018 1001   HDL 51 09/03/2018 1001   CHOLHDL 3.5 09/03/2018 1001   CHOLHDL 3.2 03/30/2011 0158   VLDL 18 03/30/2011 0158   LDLCALC 107 (H) 09/03/2018 1001   LDLDIRECT 220 (H) 03/31/2009 1955    Hepatic Function Panel Recent Labs    06/28/18 1207  PROT 6.5  ALBUMIN 3.5  AST 19  ALT 12  ALKPHOS 39  BILITOT 0.4    07/03/2017: Magnesium 1.6. Iron binding capacity slightly low at 240, otherwise iron studies normal. Ferritin elevated at 1436.   CARDIAC STUDIES:   Peripheral arteriogram 08/12/2016: Percutaneous transluminal angioplasty of the entire left SFA and proximal popliteal artery with 4 mm diameter Lutonix drug-coated angioplasty balloons   Carotid artery duplex 10/29/2016: No hemodynamically significant arterial disease in the internal carotid artery bilaterally. No significnat plaque burden noted bilaterally. Antegrade right vertebral artery flow. Antegrade left vertebral artery flow. No significant change from Carotid duplex 08/20/12.  PV Angio 10/06/2015: S/P Right EIA 6x39 and 6x60 mm balloon expandable and  self expanding stent, 90% to 0%. Right SFA 85% to 0% with 5x80 mm Self expanding stent. Two vessel r/o with AT and diffusely disease Peroneal. No runoff at the ankle. Filled by faint collaterals. Right CIA stent placed 09/19/15, 6x14 mm stent patent.  [employer] study, Renal duplex 09/07/2013: Patent transplant vasculature, resistive indicis are increasing. The renal artery  velocity at the anastomosis has increased from prior study, However this may indicate elevated velocity in the proximal iliac artery rather than presence of true stenosis.  Echocardiogram 03/30/2011: Reveals normal LV systolic function. Mild diastolic dysfunction. Trivial pericardial effusion.LVH.   Assessment & Recommendations:   Essential hypertension  Hypercholesteremia  Neurogenic orthostatic hypotension (HCC)  Type 2 diabetes mellitus with peripheral neuropathy [facility])   ED EKG 06/28/2018: Sinus rhythm.   EKG 10/22/2017: Normal sinus rhythm at rate of 91 bpm, left atrial enlargement, no evidence of ischemia. Normal QT interval.  Recommendation:   [name] is an AAF with renal transplantation due to diabetic and hypertensive renal disease on 01/23/2013 at [facility] and followed by Dr. Elmarie Shiley. She has HTN, orthostatic hypotension due to diabetic neuropathy, diabetic retinopathy and severe PAD with right common iliac artery stenting on 09/19/2015, again on 10/06/2015 had stenting of the right external iliac artery but eventually underwent RAKA in 2017 and walks with prosthesis. She has had left SFA angioplasty in Feb 2019 by Dr. Lucky Cowboy in [place].  She has residual left leg severe PAD with mild symptoms of claudication and medical therapy recommended.    I had seen her about 3 weeks ago and obtained lipid profile and took her out of work due to [place] [address] issues in view of immuno-supressed state. She wishes to go back to work but I have recommended that she does not return to work for at least next 3-4 weeks.  I'll report letter will be mailed to her.  I discontinued pravastatin due to elevated LDL, switched her to Crestor 10 mg daily.  I'll like to see her back in 6 months, we'll perform lipid profile testing if it is not done by her PCP by the time.  Presently doing well, having cost issues with BiDil and we will look into which store accepts the coupon. If she tolerates 10  mg will increase to 20 mg on next visit.   Adrian Prows, MD, [facility] 09/16/2018, 2:19 PM [facility] [place] Pager: [phone] Office: [phone] If no answer Cell [phone]

## 2018-09-16 ENCOUNTER — Ambulatory Visit (INDEPENDENT_AMBULATORY_CARE_PROVIDER_SITE_OTHER): Payer: Medicare Other | Admitting: Cardiology

## 2018-09-16 ENCOUNTER — Other Ambulatory Visit: Payer: Self-pay

## 2018-09-16 ENCOUNTER — Encounter: Payer: Self-pay | Admitting: Cardiology

## 2018-09-16 VITALS — Ht 60.0 in | Wt 158.0 lb

## 2018-09-16 DIAGNOSIS — G903 Multi-system degeneration of the autonomic nervous system: Secondary | ICD-10-CM | POA: Diagnosis not present

## 2018-09-16 DIAGNOSIS — E1142 Type 2 diabetes mellitus with diabetic polyneuropathy: Secondary | ICD-10-CM | POA: Diagnosis not present

## 2018-09-16 DIAGNOSIS — I10 Essential (primary) hypertension: Secondary | ICD-10-CM | POA: Diagnosis not present

## 2018-09-16 DIAGNOSIS — E7800 Pure hypercholesterolemia, unspecified: Secondary | ICD-10-CM | POA: Diagnosis not present

## 2018-09-16 MED ORDER — ROSUVASTATIN CALCIUM 10 MG PO TABS: 10.0000 mg | ORAL_TABLET | Freq: Every day | ORAL | 2 refills | Status: DC

## 2018-09-27 DIAGNOSIS — E1165 Type 2 diabetes mellitus with hyperglycemia: Secondary | ICD-10-CM | POA: Diagnosis not present

## 2018-09-27 DIAGNOSIS — Z794 Long term (current) use of insulin: Secondary | ICD-10-CM | POA: Diagnosis not present

## 2018-10-28 DIAGNOSIS — Z794 Long term (current) use of insulin: Secondary | ICD-10-CM | POA: Diagnosis not present

## 2018-10-28 DIAGNOSIS — E1165 Type 2 diabetes mellitus with hyperglycemia: Secondary | ICD-10-CM | POA: Diagnosis not present

## 2018-10-29 DIAGNOSIS — I10 Essential (primary) hypertension: Secondary | ICD-10-CM | POA: Diagnosis not present

## 2018-10-29 DIAGNOSIS — E1165 Type 2 diabetes mellitus with hyperglycemia: Secondary | ICD-10-CM | POA: Diagnosis not present

## 2018-10-29 DIAGNOSIS — E782 Mixed hyperlipidemia: Secondary | ICD-10-CM | POA: Diagnosis not present

## 2018-10-29 DIAGNOSIS — Z794 Long term (current) use of insulin: Secondary | ICD-10-CM | POA: Diagnosis not present

## 2018-10-29 DIAGNOSIS — J4540 Moderate persistent asthma, uncomplicated: Secondary | ICD-10-CM | POA: Diagnosis not present

## 2018-11-04 DIAGNOSIS — N2581 Secondary hyperparathyroidism of renal origin: Secondary | ICD-10-CM | POA: Diagnosis not present

## 2018-11-04 DIAGNOSIS — N182 Chronic kidney disease, stage 2 (mild): Secondary | ICD-10-CM | POA: Diagnosis not present

## 2018-11-04 DIAGNOSIS — I129 Hypertensive chronic kidney disease with stage 1 through stage 4 chronic kidney disease, or unspecified chronic kidney disease: Secondary | ICD-10-CM | POA: Diagnosis not present

## 2018-11-04 DIAGNOSIS — D631 Anemia in chronic kidney disease: Secondary | ICD-10-CM | POA: Diagnosis not present

## 2018-11-04 DIAGNOSIS — Z940 Kidney transplant status: Secondary | ICD-10-CM | POA: Diagnosis not present

## 2018-11-10 ENCOUNTER — Other Ambulatory Visit: Payer: Self-pay

## 2018-11-10 ENCOUNTER — Ambulatory Visit (INDEPENDENT_AMBULATORY_CARE_PROVIDER_SITE_OTHER): Payer: Medicare Other

## 2018-11-10 ENCOUNTER — Ambulatory Visit (INDEPENDENT_AMBULATORY_CARE_PROVIDER_SITE_OTHER): Payer: Medicare Other | Admitting: Vascular Surgery

## 2018-11-10 DIAGNOSIS — T82898D Other specified complication of vascular prosthetic devices, implants and grafts, subsequent encounter: Secondary | ICD-10-CM | POA: Diagnosis not present

## 2018-11-10 DIAGNOSIS — Z992 Dependence on renal dialysis: Secondary | ICD-10-CM

## 2018-11-10 NOTE — Progress Notes (Signed)
Primary Physician/Referring:  Benito Mccreedy, MD  Patient ID: [name], female    DOB: 1971-06-16, 48 y.o.   MRN: [id number]  Chief Complaint  Patient presents with  . Loss of Consciousness    pt states she passed out about 2 weeks ago, went to pcp, low BP, took off BP meds   . Tachycardia    HPI: [name]  is a 48 y.o. female  with renal transplantation due to diabetic and hypertensive renal disease on 01/23/2013 at [facility] and followed by Dr. Elmarie Shiley. She has HTN, orthostatic hypotension due to diabetic neuropathy, diabetic retinopathy and severe PAD with right common iliac artery stenting on 09/19/2015, again on 10/06/2015 had stenting of the right external iliac artery but eventually underwent RAKA in 2017 and walks with prosthesis. She has had left SFA angioplasty in Feb 2019 by Dr. Lucky Cowboy in [place].  She presents today for follow up ofsyncope. She had an episode of syncope 3 weeks ago at home, was trying to walk to the bathroom, suddenly fell without any premonition or presyncope symptoms, had thrown up all over herself.  The following day she continued to have frequent diarrhea and nausea, her blood pressure was recorded to be 90 mmHg by her PCP, due to low blood pressures his discontinued labetalol and also amlodipine.  Since then she has had mild dizziness occasionally when she stands up but no syncope.  She was also seen in ER in Jan 2020 for syncope felt to be related to dehydration.   She is now on insulin pump, since then diabetes is well controlled.    Past Medical History:  Diagnosis Date  . Anemia   . CHF (congestive heart failure) ([place])   . Critical lower limb ischemia 10/05/2015  . GERD (gastroesophageal reflux disease)   . High cholesterol   . History of blood transfusion    related to "kidney transplant"  . Hypercholesteremia 08/14/2006   Qualifier: Diagnosis of  By: Eusebio Friendly    . Mechanical complication of other vascular device,  implant, and graft 09/22/2012  . Metabolic bone disease   . PAD (peripheral artery disease) ([place])   . Pericardial effusion   . Retinopathy   . Type II diabetes mellitus ([place])    "controlled with diet"    Past Surgical History:  Procedure Laterality Date  . ABOVE KNEE LEG AMPUTATION Right   . AMPUTATION Right 10/13/2015   Procedure: Right Transmetatarsal Amputation;  Surgeon: Newt Minion, MD;  Location: [place];  Service: Orthopedics;  Laterality: Right;  . AMPUTATION Right 11/29/2015   Procedure: RIGHT BELOW KNEE AMPUTATION;  Surgeon: Newt Minion, MD;  Location: [place];  Service: Orthopedics;  Laterality: Right;  . AMPUTATION FINGER     Rt hand middle finger  . AV FISTULA PLACEMENT  10/04/2011   Procedure: INSERTION OF ARTERIOVENOUS (AV) GORE-TEX GRAFT ARM;  Surgeon: Angelia Mould, MD;  Location: [place];  Service: Vascular;  Laterality: Left;  Insertion left upper arm Arteriovenous goretex graft  . AV FISTULA PLACEMENT  10/29/2011   Procedure: ARTERIOVENOUS (AV) FISTULA CREATION;  Surgeon: Angelia Mould, MD;  Location: [facility] OR;  Service: Vascular;  Laterality: Right;  Creation Right Arteriovenous Fistula   . [place] REMOVAL  10/04/2011   Procedure: REMOVAL OF ARTERIOVENOUS GORETEX GRAFT ([place]);  Surgeon: Elam Dutch, MD;  Location: [place];  Service: Vascular;  Laterality: Left;  . CHOLECYSTECTOMY N/A 12/08/2015   Procedure: LAPAROSCOPIC CHOLECYSTECTOMY WITH INTRAOPERATIVE CHOLANGIOGRAM;  Surgeon: Stark Klein, MD;  Location: [place];  Service: General;  Laterality: N/A;  . ESOPHAGOGASTRODUODENOSCOPY N/A 12/24/2012   Procedure: ESOPHAGOGASTRODUODENOSCOPY (EGD);  Surgeon: Milus Banister, MD;  Location: [place];  Service: Endoscopy;  Laterality: N/A;  . FINGER AMPUTATION Right 02/26/2013   "3rd finger"  . INSERTION OF DIALYSIS CATHETER  10/04/2011   Procedure: INSERTION OF DIALYSIS CATHETER;  Surgeon: Angelia Mould, MD;  Location: [place];  Service: Vascular;  Laterality:  Right;  insertion of dialysis catheter right internal jugular  . INSERTION OF DIALYSIS CATHETER  06/23/2012   Procedure: INSERTION OF DIALYSIS CATHETER;  Surgeon: Angelia Mould, MD;  Location: [place];  Service: Vascular;  Laterality: N/A;  Ultrasound guided  . KIDNEY TRANSPLANT  01/24/2013  . LOWER EXTREMITY ANGIOGRAPHY Left 08/12/2016   Procedure: Lower Extremity Angiography;  Surgeon: Algernon Huxley, MD;  Location: [place] [facility];  Service: Cardiovascular;  Laterality: Left;  . LOWER EXTREMITY INTERVENTION  08/12/2016   Procedure: Lower Extremity Intervention;  Surgeon: Algernon Huxley, MD;  Location: [place] [facility];  Service: Cardiovascular;;  . PATCH ANGIOPLASTY  06/23/2012   Procedure: PATCH ANGIOPLASTY;  Surgeon: Angelia Mould, MD;  Location: [place];  Service: Vascular;  Laterality: Right;  . PERIPHERAL VASCULAR CATHETERIZATION N/A 09/19/2015   Procedure: Lower Extremity Angiography;  Surgeon: Adrian Prows, MD;  Location: [place] [facility];  Service: Cardiovascular;  Laterality: N/A;  . PERIPHERAL VASCULAR CATHETERIZATION N/A 09/19/2015   Procedure: Abdominal Aortogram;  Surgeon: Adrian Prows, MD;  Location: [place] [facility];  Service: Cardiovascular;  Laterality: N/A;  . PERIPHERAL VASCULAR CATHETERIZATION Right 09/19/2015   Procedure: Peripheral Vascular Intervention;  Surgeon: Adrian Prows, MD;  Location: [place] [facility];  Service: Cardiovascular;  Laterality: Right;  Right Common  Iliac  . PERIPHERAL VASCULAR CATHETERIZATION N/A 10/06/2015   Procedure: Lower Extremity Angiography;  Surgeon: Adrian Prows, MD;  Location: [place] [facility];  Service: Cardiovascular;  Laterality: N/A;  . PERIPHERAL VASCULAR CATHETERIZATION  10/06/2015   Procedure: Peripheral Vascular Intervention;  Surgeon: Adrian Prows, MD;  Location: [place] [facility];  Service: Cardiovascular;;  REIA  Omnilink 6.0x29, innova 7x80  RSFA innova 5x80  . REVISON OF ARTERIOVENOUS FISTULA  06/23/2012   Procedure: REVISON OF  ARTERIOVENOUS FISTULA;  Surgeon: Angelia Mould, MD;  Location: [place];  Service: Vascular;  Laterality: Right;  Ultrasound guided  . SHUNTOGRAM N/A 04/06/2012   Procedure: Earney Mallet;  Surgeon: Angelia Mould, MD;  Location: [facility] CATH LAB;  Service: Cardiovascular;  Laterality: N/A;    Social History   Socioeconomic History  . Marital status: Single    Spouse name: Not on file  . Number of children: 0  . Years of education: Not on file  . Highest education level: Not on file  Occupational History  . Not on file  Social Needs  . Financial resource strain: Not on file  . Food insecurity:    Worry: Not on file    Inability: Not on file  . Transportation needs:    Medical: Not on file    Non-medical: Not on file  Tobacco Use  . Smoking status: Never Smoker  . Smokeless tobacco: Never Used  Substance and Sexual Activity  . Alcohol use: No  . Drug use: No  . Sexual activity: Never  Lifestyle  . Physical activity:    Days per week: Not on file    Minutes per session: Not on file  . Stress:  Not on file  Relationships  . Social connections:    Talks on phone: Not on file    Gets together: Not on file    Attends religious service: Not on file    Active member of club or organization: Not on file    Attends meetings of clubs or organizations: Not on file    Relationship status: Not on file  . Intimate partner violence:    Fear of current or ex partner: Not on file    Emotionally abused: Not on file    Physically abused: Not on file    Forced sexual activity: Not on file  Other Topics Concern  . Not on file  Social History Narrative  . Not on file    Current Outpatient Medications on File Prior to Visit  Medication Sig Dispense Refill  . albuterol (ACCUNEB) 0.63 MG/3ML nebulizer solution 1 ampule 3 (three) times daily as needed.     Marland Kitchen albuterol (PROVENTIL HFA;VENTOLIN HFA) 108 (90 BASE) MCG/ACT inhaler Inhale 1 puff into the lungs every 6 (six) hours as needed  for wheezing or shortness of breath.    . betamethasone dipropionate (DIPROLENE) 0.05 % cream   3  . cetirizine (ZYRTEC) 10 MG tablet Take 10 mg by mouth daily.    . furosemide (LASIX) 40 MG tablet Take 40 mg by mouth daily.     . insulin aspart (NOVOLOG) 100 UNIT/ML injection Inject 0-16 Units into the skin. Pt on insulin pump.  Total of 16 units per day.    . mycophenolate (MYFORTIC) 180 MG EC tablet Take 540 mg by mouth 2 (two) times daily. (540mg ) in the morning and bedtime-( 3 capsules )    . pantoprazole (PROTONIX) 40 MG tablet Take 40 mg by mouth at bedtime.     . rosuvastatin (CRESTOR) 10 MG tablet Take 1 tablet (10 mg total) by mouth daily. 90 tablet 2  . sulfamethoxazole-trimethoprim (BACTRIM,SEPTRA) 400-80 MG per tablet Take 1 tablet by mouth every Monday, Wednesday, and Friday.     . tacrolimus (PROGRAF) 1 MG capsule Take 2 mg by mouth 2 (two) times daily.      Current Facility-Administered Medications on File Prior to Visit  Medication Dose Route Frequency Provider Last Rate Last Dose  . 0.9 %  sodium chloride infusion   Intravenous Continuous Dew, Erskine Squibb, MD        Review of Systems  Constitution: Negative for decreased appetite, malaise/fatigue, weight gain and weight loss.  Eyes: Positive for photophobia. Negative for visual disturbance.  Cardiovascular: Positive for claudication (night cramps) and near-syncope. Negative for chest pain, dyspnea on exertion, leg swelling, orthopnea, palpitations and syncope.  Respiratory: Negative for hemoptysis and wheezing.   Endocrine: Negative for cold intolerance and heat intolerance.  Hematologic/Lymphatic: Does not bruise/bleed easily.  Skin: Negative for nail changes.  Musculoskeletal: Negative for muscle weakness and myalgias.  Gastrointestinal: Positive for change in bowel habit. Negative for abdominal pain, nausea and vomiting.  Neurological: Positive for dizziness. Negative for difficulty with concentration, focal weakness and  headaches.  Psychiatric/Behavioral: Negative for altered mental status and suicidal ideas.  All other systems reviewed and are negative.     Objective  Blood pressure (!) 148/75, pulse (!) 108, temperature (!) 97 F (36.1 C), height 5' (1.524 m), weight 151 lb (68.5 kg), SpO2 96 %. Body mass index is 29.49 kg/m.      11/11/18 1306  11/11/18 1308  11/11/18 1309  Vital Signs  BP 150/82 131/77 175/92 140/84 148/75  Pulse Rate 110 111 99 98 108  BP Location Left Arm Left Arm Left Arm Left Arm Left Arm  BP Method     Automatic Automatic Automatic  Cuff Size Normal Normal Normal Normal Normal  Patient Position (if appropriate)     Lying Sitting Standing  Oxygen Therapy  SpO2 97 % 97 % 98 % 97 % 96 %    Physical Exam  Constitutional: She is oriented to person, place, and time. Vital signs are normal. She appears well-developed and well-nourished. No distress.  HENT:  Head: Normocephalic and atraumatic.  Eyes: Conjunctivae are normal.  Neck: Normal range of motion. Neck supple. No JVD present. No thyromegaly present.  Cardiovascular: Normal rate, regular rhythm, S1 normal, S2 normal, normal heart sounds and intact distal pulses. Exam reveals no gallop.    Midsystolic murmur is present. Aortic Area Pulses:      Carotid pulses are on the right side with bruit and on the left side with bruit.      Femoral pulses are on the right side with bruit and on the left side with bruit.      Popliteal pulses are 2+ on the left side.       Dorsalis pedis pulses are 1+ on the left side.       Posterior tibial pulses are 0 on the left side.  Right BKA noted  Pulmonary/Chest: Effort normal and breath sounds normal. No accessory muscle usage. No respiratory distress.  Abdominal: Soft. Bowel sounds are normal.  Epigastric bruit present, No prominent abdominal aortic pulsation.  Musculoskeletal: Normal range of motion.        General: Deformity (right BKA) present. No edema.   Neurological: She is alert and oriented to person, place, and time.  Skin: Skin is warm and dry.  Lower Extremity Inspection - Left - Loss of hair and Shiny atrophic skin, No Ulcerations. Right - Inspection Normal(Right below knee amputation, stump has healed well). Palpation - Temperature - Left - Cool. Right - Normal.  Psychiatric: She has a normal mood and affect.  Vitals reviewed.  Laboratory examination:    CMP Latest Ref Rng & Units 06/28/2018 08/12/2016 12/11/2015  Glucose 70 - 99 mg/dL 102(H) - 148(H)  BUN 6 - 20 mg/dL 17 23(H) 11  Creatinine 0.44 - 1.00 mg/dL 1.01(H) 0.98 0.78  Sodium 135 - 145 mmol/L 139 - 137  Potassium 3.5 - 5.1 mmol/L 4.0 - 3.3(L)  Chloride 98 - 111 mmol/L 108 - 107  CO2 22 - 32 mmol/L 23 - 23  Calcium 8.9 - 10.3 mg/dL 9.0 - 8.1(L)  Total Protein 6.5 - 8.1 g/dL 6.5 - -  Total Bilirubin 0.3 - 1.2 mg/dL 0.4 - -  Alkaline Phos 38 - 126 U/L 39 - -  AST 15 - 41 U/L 19 - -  ALT 0 - 44 U/L 12 - -   CBC Latest Ref Rng & Units 06/28/2018 04/26/2016 04/12/2016  WBC 4.0 - 10.5 K/uL 3.1(L) - -  Hemoglobin 12.0 - 15.0 g/dL 11.4(L) 12.6 12.1  Hematocrit 36.0 - 46.0 % 38.5 - -  Platelets 150 - 400 K/uL 152 - -   Lipid Panel     Component Value Date/Time   CHOL 176 09/03/2018 1001   TRIG 88 09/03/2018 1001   HDL 51 09/03/2018 1001   CHOLHDL 3.5 09/03/2018 1001   CHOLHDL 3.2 03/30/2011 0158   VLDL 18 03/30/2011 0158   LDLCALC 107 (  H) 09/03/2018 1001   LDLDIRECT 220 (H) 03/31/2009 1955   HEMOGLOBIN A1C Lab Results  Component Value Date   HGBA1C 8.3 (H) 10/13/2015   MPG 192 10/13/2015   TSH No results for input(s): TSH in the last 8760 hours.  Cardiac Studies:   Peripheral arteriogram 08/12/2016: Percutaneous transluminal angioplasty of the entire left SFA and proximal popliteal artery with 4 mm diameter Lutonix drug-coated angioplasty balloons   Carotid artery duplex 10/29/2016: No hemodynamically significant arterial disease in the internal carotid  artery bilaterally. No significnat plaque burden noted bilaterally. Antegrade right vertebral artery flow. Antegrade left vertebral artery flow. No significant change from Carotid duplex 08/20/12.  PV Angio 10/06/2015: S/P Right EIA 6x39 and 6x60 mm balloon expandable and self expanding stent, 90% to 0%. Right SFA 85% to 0% with 5x80 mm Self expanding stent. Two vessel r/o with AT and diffusely disease Peroneal. No runoff at the ankle. Filled by faint collaterals. Right CIA stent placed 09/19/15, 6x14 mm stent patent.  [employer] study, Renal duplex 09/07/2013: Patent transplant vasculature, resistive indicis are increasing. The renal artery velocity at the anastomosis has increased from prior study, However this may indicate elevated velocity in the proximal iliac artery rather than presence of true stenosis.  Echocardiogram 03/30/2011: Reveals normal LV systolic function. Mild diastolic dysfunction. Trivial pericardial effusion.LVH.   Assessment   Syncope and collapse  Orthostatic hypotension - Plan: EKG 12-Lead  Essential hypertension - Plan: amLODipine (NORVASC) 5 MG tablet  Diabetic autonomic neuropathy associated with type 2 diabetes mellitus ([place])  Peripheral artery disease ([place])  Renal transplant recipient  EKG 11/11/18: Sinus tachycardia cardiac rate of 104 beats minute, left atrial abnormality, otherwise normal EKG.  No evidence of ischemia, normal QT interval. No significant change from  EKG 10/22/2017.  Recommendations:   Patient being evaluated on an urgent basis due to recurrent episodes of syncope and near syncope.  Patient symptoms are related to severe orthostatic hypotension related to diabetic peripheral autonomic neuropathy.  Unfortunately she has supine hypertension and orthostatic hypotension which will very difficult to treat.  However I would rather except hypertension than hypotension.  These issues were discussed in detail with the patient. She is also  developed frequent diarrhea related to probably diabetic autonomic neuropathy.  She will continue taking her blood pressure medications, specifically amlodipine only if the systolic blood pressure is greater than or  Equal to 140 mmHg.  I will decrease labetalol to 50 mg b.i.d. dose from 300 mg p.o. b.i.d.  She has residual left leg severe PAD with mild symptoms of claudication and medical therapy recommended.    Adrian Prows, MD, [facility] 11/11/2018, 1:28 PM [facility] [place] Pager: [phone] Office: [phone] If no answer Cell [phone]

## 2018-11-11 ENCOUNTER — Ambulatory Visit (INDEPENDENT_AMBULATORY_CARE_PROVIDER_SITE_OTHER): Payer: Medicare Other | Admitting: Cardiology

## 2018-11-11 ENCOUNTER — Encounter: Payer: Self-pay | Admitting: Cardiology

## 2018-11-11 VITALS — BP 148/75 | HR 108 | Temp 97.0°F | Ht 60.0 in | Wt 151.0 lb

## 2018-11-11 DIAGNOSIS — I10 Essential (primary) hypertension: Secondary | ICD-10-CM | POA: Diagnosis not present

## 2018-11-11 DIAGNOSIS — R55 Syncope and collapse: Secondary | ICD-10-CM | POA: Diagnosis not present

## 2018-11-11 DIAGNOSIS — I739 Peripheral vascular disease, unspecified: Secondary | ICD-10-CM

## 2018-11-11 DIAGNOSIS — E1143 Type 2 diabetes mellitus with diabetic autonomic (poly)neuropathy: Secondary | ICD-10-CM

## 2018-11-11 DIAGNOSIS — I951 Orthostatic hypotension: Secondary | ICD-10-CM

## 2018-11-11 DIAGNOSIS — Z940 Kidney transplant status: Secondary | ICD-10-CM

## 2018-11-11 MED ORDER — AMLODIPINE BESYLATE 5 MG PO TABS: 5.0000 mg | ORAL_TABLET | Freq: Every day | ORAL | 3 refills | Status: DC

## 2018-11-11 MED ORDER — LABETALOL HCL 100 MG PO TABS: 50.0000 mg | ORAL_TABLET | Freq: Two times a day (BID) | ORAL | 1 refills | Status: DC

## 2018-11-12 ENCOUNTER — Other Ambulatory Visit (INDEPENDENT_AMBULATORY_CARE_PROVIDER_SITE_OTHER): Payer: Self-pay | Admitting: Vascular Surgery

## 2018-11-12 DIAGNOSIS — N186 End stage renal disease: Secondary | ICD-10-CM

## 2018-11-16 ENCOUNTER — Encounter (INDEPENDENT_AMBULATORY_CARE_PROVIDER_SITE_OTHER): Payer: Self-pay | Admitting: Vascular Surgery

## 2018-11-28 DIAGNOSIS — Z794 Long term (current) use of insulin: Secondary | ICD-10-CM | POA: Diagnosis not present

## 2018-11-28 DIAGNOSIS — E1165 Type 2 diabetes mellitus with hyperglycemia: Secondary | ICD-10-CM | POA: Diagnosis not present

## 2018-12-01 DIAGNOSIS — J4540 Moderate persistent asthma, uncomplicated: Secondary | ICD-10-CM | POA: Diagnosis not present

## 2018-12-01 DIAGNOSIS — R300 Dysuria: Secondary | ICD-10-CM | POA: Diagnosis not present

## 2018-12-01 DIAGNOSIS — Z794 Long term (current) use of insulin: Secondary | ICD-10-CM | POA: Diagnosis not present

## 2018-12-01 DIAGNOSIS — E782 Mixed hyperlipidemia: Secondary | ICD-10-CM | POA: Diagnosis not present

## 2018-12-01 DIAGNOSIS — E1165 Type 2 diabetes mellitus with hyperglycemia: Secondary | ICD-10-CM | POA: Diagnosis not present

## 2018-12-03 DIAGNOSIS — L84 Corns and callosities: Secondary | ICD-10-CM | POA: Diagnosis not present

## 2018-12-03 DIAGNOSIS — I739 Peripheral vascular disease, unspecified: Secondary | ICD-10-CM | POA: Diagnosis not present

## 2018-12-03 DIAGNOSIS — M2042 Other hammer toe(s) (acquired), left foot: Secondary | ICD-10-CM | POA: Diagnosis not present

## 2018-12-03 DIAGNOSIS — Z89511 Acquired absence of right leg below knee: Secondary | ICD-10-CM | POA: Diagnosis not present

## 2018-12-24 DIAGNOSIS — E1165 Type 2 diabetes mellitus with hyperglycemia: Secondary | ICD-10-CM | POA: Diagnosis not present

## 2018-12-24 DIAGNOSIS — R809 Proteinuria, unspecified: Secondary | ICD-10-CM | POA: Diagnosis not present

## 2018-12-24 DIAGNOSIS — Z940 Kidney transplant status: Secondary | ICD-10-CM | POA: Diagnosis not present

## 2018-12-24 DIAGNOSIS — E7800 Pure hypercholesterolemia, unspecified: Secondary | ICD-10-CM | POA: Diagnosis not present

## 2018-12-25 DIAGNOSIS — Z794 Long term (current) use of insulin: Secondary | ICD-10-CM | POA: Diagnosis not present

## 2018-12-25 DIAGNOSIS — J4540 Moderate persistent asthma, uncomplicated: Secondary | ICD-10-CM | POA: Diagnosis not present

## 2018-12-25 DIAGNOSIS — E1165 Type 2 diabetes mellitus with hyperglycemia: Secondary | ICD-10-CM | POA: Diagnosis not present

## 2018-12-25 DIAGNOSIS — E782 Mixed hyperlipidemia: Secondary | ICD-10-CM | POA: Diagnosis not present

## 2018-12-25 DIAGNOSIS — Z0000 Encounter for general adult medical examination without abnormal findings: Secondary | ICD-10-CM | POA: Diagnosis not present

## 2018-12-28 DIAGNOSIS — E1165 Type 2 diabetes mellitus with hyperglycemia: Secondary | ICD-10-CM | POA: Diagnosis not present

## 2018-12-28 DIAGNOSIS — Z794 Long term (current) use of insulin: Secondary | ICD-10-CM | POA: Diagnosis not present

## 2019-01-14 DIAGNOSIS — E782 Mixed hyperlipidemia: Secondary | ICD-10-CM | POA: Diagnosis not present

## 2019-01-14 DIAGNOSIS — I10 Essential (primary) hypertension: Secondary | ICD-10-CM | POA: Diagnosis not present

## 2019-01-14 DIAGNOSIS — J4540 Moderate persistent asthma, uncomplicated: Secondary | ICD-10-CM | POA: Diagnosis not present

## 2019-01-14 DIAGNOSIS — E1165 Type 2 diabetes mellitus with hyperglycemia: Secondary | ICD-10-CM | POA: Diagnosis not present

## 2019-01-14 DIAGNOSIS — Z794 Long term (current) use of insulin: Secondary | ICD-10-CM | POA: Diagnosis not present

## 2019-01-14 DIAGNOSIS — R7989 Other specified abnormal findings of blood chemistry: Secondary | ICD-10-CM | POA: Diagnosis not present

## 2019-01-28 DIAGNOSIS — Z940 Kidney transplant status: Secondary | ICD-10-CM | POA: Diagnosis not present

## 2019-01-28 DIAGNOSIS — N390 Urinary tract infection, site not specified: Secondary | ICD-10-CM | POA: Diagnosis not present

## 2019-01-28 DIAGNOSIS — E1165 Type 2 diabetes mellitus with hyperglycemia: Secondary | ICD-10-CM | POA: Diagnosis not present

## 2019-01-28 DIAGNOSIS — I10 Essential (primary) hypertension: Secondary | ICD-10-CM | POA: Diagnosis not present

## 2019-01-28 DIAGNOSIS — Z7722 Contact with and (suspected) exposure to environmental tobacco smoke (acute) (chronic): Secondary | ICD-10-CM | POA: Diagnosis not present

## 2019-01-28 DIAGNOSIS — Z7952 Long term (current) use of systemic steroids: Secondary | ICD-10-CM | POA: Diagnosis not present

## 2019-01-28 DIAGNOSIS — Z792 Long term (current) use of antibiotics: Secondary | ICD-10-CM | POA: Diagnosis not present

## 2019-01-28 DIAGNOSIS — Z5181 Encounter for therapeutic drug level monitoring: Secondary | ICD-10-CM | POA: Diagnosis not present

## 2019-01-28 DIAGNOSIS — Z794 Long term (current) use of insulin: Secondary | ICD-10-CM | POA: Diagnosis not present

## 2019-01-28 DIAGNOSIS — D649 Anemia, unspecified: Secondary | ICD-10-CM | POA: Diagnosis not present

## 2019-01-28 DIAGNOSIS — D72819 Decreased white blood cell count, unspecified: Secondary | ICD-10-CM | POA: Diagnosis not present

## 2019-01-28 DIAGNOSIS — Z4822 Encounter for aftercare following kidney transplant: Secondary | ICD-10-CM | POA: Diagnosis not present

## 2019-01-28 DIAGNOSIS — Z23 Encounter for immunization: Secondary | ICD-10-CM | POA: Diagnosis not present

## 2019-01-28 DIAGNOSIS — E872 Acidosis: Secondary | ICD-10-CM | POA: Diagnosis not present

## 2019-02-11 DIAGNOSIS — E1165 Type 2 diabetes mellitus with hyperglycemia: Secondary | ICD-10-CM | POA: Diagnosis not present

## 2019-02-11 DIAGNOSIS — Z794 Long term (current) use of insulin: Secondary | ICD-10-CM | POA: Diagnosis not present

## 2019-02-11 NOTE — Progress Notes (Deleted)
Primary Physician/Referring:  Benito Mccreedy, MD  Patient ID: [name], female    DOB: 09/20/1970, 48 y.o.   MRN: [id number]  No chief complaint on file.   HPI: [name]  is a 48 y.o. female  with renal transplantation due to diabetic and hypertensive renal disease on 01/23/2013 at [facility] and followed by Dr. Elmarie Shiley. She has HTN, orthostatic hypotension due to diabetic neuropathy, diabetic retinopathy and severe PAD with right common iliac artery stenting on 09/19/2015, again on 10/06/2015 had stenting of the right external iliac artery but eventually underwent RAKA in 2017 and walks with prosthesis. She has had left SFA angioplasty in Feb 2019 by Dr. Lucky Cowboy in [place].  She presents today for follow up ofsyncope. She had an episode of syncope 3 weeks ago at home, was trying to walk to the bathroom, suddenly fell without any premonition or presyncope symptoms, had thrown up all over herself.  The following day she continued to have frequent diarrhea and nausea, her blood pressure was recorded to be 90 mmHg by her PCP, due to low blood pressures his discontinued labetalol and also amlodipine.  Since then she has had mild dizziness occasionally when she stands up but no syncope.  She was also seen in ER in Jan 2020 for syncope felt to be related to dehydration.   She is now on insulin pump, since then diabetes is well controlled.    Past Medical History:  Diagnosis Date  . Anemia   . CHF (congestive heart failure) ([place])   . Critical lower limb ischemia 10/05/2015  . GERD (gastroesophageal reflux disease)   . High cholesterol   . History of blood transfusion    related to "kidney transplant"  . Hypercholesteremia 08/14/2006   Qualifier: Diagnosis of  By: Eusebio Friendly    . Mechanical complication of other vascular device, implant, and graft 09/22/2012  . Metabolic bone disease   . PAD (peripheral artery disease) ([place])   . Pericardial effusion   . Retinopathy    . Type II diabetes mellitus ([place])    "controlled with diet"    Past Surgical History:  Procedure Laterality Date  . ABOVE KNEE LEG AMPUTATION Right   . AMPUTATION Right 10/13/2015   Procedure: Right Transmetatarsal Amputation;  Surgeon: Newt Minion, MD;  Location: [place];  Service: Orthopedics;  Laterality: Right;  . AMPUTATION Right 11/29/2015   Procedure: RIGHT BELOW KNEE AMPUTATION;  Surgeon: Newt Minion, MD;  Location: [place];  Service: Orthopedics;  Laterality: Right;  . AMPUTATION FINGER     Rt hand middle finger  . AV FISTULA PLACEMENT  10/04/2011   Procedure: INSERTION OF ARTERIOVENOUS (AV) GORE-TEX GRAFT ARM;  Surgeon: Angelia Mould, MD;  Location: [place];  Service: Vascular;  Laterality: Left;  Insertion left upper arm Arteriovenous goretex graft  . AV FISTULA PLACEMENT  10/29/2011   Procedure: ARTERIOVENOUS (AV) FISTULA CREATION;  Surgeon: Angelia Mould, MD;  Location: [facility] OR;  Service: Vascular;  Laterality: Right;  Creation Right Arteriovenous Fistula   . [place] REMOVAL  10/04/2011   Procedure: REMOVAL OF ARTERIOVENOUS GORETEX GRAFT ([place]);  Surgeon: Elam Dutch, MD;  Location: [place];  Service: Vascular;  Laterality: Left;  . CHOLECYSTECTOMY N/A 12/08/2015   Procedure: LAPAROSCOPIC CHOLECYSTECTOMY WITH INTRAOPERATIVE CHOLANGIOGRAM;  Surgeon: Stark Klein, MD;  Location: [place];  Service: General;  Laterality: N/A;  . ESOPHAGOGASTRODUODENOSCOPY N/A 12/24/2012   Procedure: ESOPHAGOGASTRODUODENOSCOPY (EGD);  Surgeon: Milus Banister, MD;  Location: [facility];  Service: Endoscopy;  Laterality: N/A;  . FINGER AMPUTATION Right 02/26/2013   "3rd finger"  . INSERTION OF DIALYSIS CATHETER  10/04/2011   Procedure: INSERTION OF DIALYSIS CATHETER;  Surgeon: Angelia Mould, MD;  Location: [place];  Service: Vascular;  Laterality: Right;  insertion of dialysis catheter right internal jugular  . INSERTION OF DIALYSIS CATHETER  06/23/2012   Procedure: INSERTION OF DIALYSIS  CATHETER;  Surgeon: Angelia Mould, MD;  Location: [place];  Service: Vascular;  Laterality: N/A;  Ultrasound guided  . KIDNEY TRANSPLANT  01/24/2013  . LOWER EXTREMITY ANGIOGRAPHY Left 08/12/2016   Procedure: Lower Extremity Angiography;  Surgeon: Algernon Huxley, MD;  Location: [place] [facility];  Service: Cardiovascular;  Laterality: Left;  . LOWER EXTREMITY INTERVENTION  08/12/2016   Procedure: Lower Extremity Intervention;  Surgeon: Algernon Huxley, MD;  Location: [place] [facility];  Service: Cardiovascular;;  . PATCH ANGIOPLASTY  06/23/2012   Procedure: PATCH ANGIOPLASTY;  Surgeon: Angelia Mould, MD;  Location: [place];  Service: Vascular;  Laterality: Right;  . PERIPHERAL VASCULAR CATHETERIZATION N/A 09/19/2015   Procedure: Lower Extremity Angiography;  Surgeon: Adrian Prows, MD;  Location: [place] [facility];  Service: Cardiovascular;  Laterality: N/A;  . PERIPHERAL VASCULAR CATHETERIZATION N/A 09/19/2015   Procedure: Abdominal Aortogram;  Surgeon: Adrian Prows, MD;  Location: [place] [facility];  Service: Cardiovascular;  Laterality: N/A;  . PERIPHERAL VASCULAR CATHETERIZATION Right 09/19/2015   Procedure: Peripheral Vascular Intervention;  Surgeon: Adrian Prows, MD;  Location: [place] [facility];  Service: Cardiovascular;  Laterality: Right;  Right Common  Iliac  . PERIPHERAL VASCULAR CATHETERIZATION N/A 10/06/2015   Procedure: Lower Extremity Angiography;  Surgeon: Adrian Prows, MD;  Location: [place] [facility];  Service: Cardiovascular;  Laterality: N/A;  . PERIPHERAL VASCULAR CATHETERIZATION  10/06/2015   Procedure: Peripheral Vascular Intervention;  Surgeon: Adrian Prows, MD;  Location: [place] [facility];  Service: Cardiovascular;;  REIA  Omnilink 6.0x29, innova 7x80  RSFA innova 5x80  . REVISON OF ARTERIOVENOUS FISTULA  06/23/2012   Procedure: REVISON OF ARTERIOVENOUS FISTULA;  Surgeon: Angelia Mould, MD;  Location: [place];  Service: Vascular;  Laterality: Right;  Ultrasound guided  .  SHUNTOGRAM N/A 04/06/2012   Procedure: Earney Mal[place];  Surgeon: Angelia Mould, MD;  Location: [facility] CATH LAB;  Service: Cardiovascular;  Laterality: N/A;    Social History   Socioeconomic History  . Marital status: Single    Spouse name: Not on file  . Number of children: 0  . Years of education: Not on file  . Highest education level: Not on file  Occupational History  . Not on file  Social Needs  . Financial resource strain: Not on file  . Food insecurity    Worry: Not on file    Inability: Not on file  . Transportation needs    Medical: Not on file    Non-medical: Not on file  Tobacco Use  . Smoking status: Never Smoker  . Smokeless tobacco: Never Used  Substance and Sexual Activity  . Alcohol use: No  . Drug use: No  . Sexual activity: Never  Lifestyle  . Physical activity    Days per week: Not on file    Minutes per session: Not on file  . Stress: Not on file  Relationships  . Social Herbalist on phone: Not on file    Gets together: Not on file    Attends religious  service: Not on file    Active member of club or organization: Not on file    Attends meetings of clubs or organizations: Not on file    Relationship status: Not on file  . Intimate partner violence    Fear of current or ex partner: Not on file    Emotionally abused: Not on file    Physically abused: Not on file    Forced sexual activity: Not on file  Other Topics Concern  . Not on file  Social History Narrative  . Not on file    Current Outpatient Medications on File Prior to Visit  Medication Sig Dispense Refill  . albuterol (ACCUNEB) 0.63 MG/3ML nebulizer solution 1 ampule 3 (three) times daily as needed.     Marland Kitchen albuterol (PROVENTIL HFA;VENTOLIN HFA) 108 (90 BASE) MCG/ACT inhaler Inhale 1 puff into the lungs every 6 (six) hours as needed for wheezing or shortness of breath.    Marland Kitchen amLODipine (NORVASC) 5 MG tablet Take 1 tablet (5 mg total) by mouth daily. If sitting BP >140/80 mm  Hg 90 tablet 3  . betamethasone dipropionate (DIPROLENE) 0.05 % cream   3  . cetirizine (ZYRTEC) 10 MG tablet Take 10 mg by mouth daily.    . furosemide (LASIX) 40 MG tablet Take 40 mg by mouth daily.     . insulin aspart (NOVOLOG) 100 UNIT/ML injection Inject 0-16 Units into the skin. Pt on insulin pump.  Total of 16 units per day.    . labetalol (NORMODYNE) 100 MG tablet Take 0.5 tablets (50 mg total) by mouth 2 (two) times daily. 60 tablet 1  . mycophenolate (MYFORTIC) 180 MG EC tablet Take 540 mg by mouth 2 (two) times daily. (540mg ) in the morning and bedtime-( 3 capsules )    . pantoprazole (PROTONIX) 40 MG tablet Take 40 mg by mouth at bedtime.     . rosuvastatin (CRESTOR) 10 MG tablet Take 1 tablet (10 mg total) by mouth daily. 90 tablet 2  . sulfamethoxazole-trimethoprim (BACTRIM,SEPTRA) 400-80 MG per tablet Take 1 tablet by mouth every Monday, Wednesday, and Friday.     . tacrolimus (PROGRAF) 1 MG capsule Take 2 mg by mouth 2 (two) times daily.      Current Facility-Administered Medications on File Prior to Visit  Medication Dose Route Frequency Provider Last Rate Last Dose  . 0.9 %  sodium chloride infusion   Intravenous Continuous Dew, Erskine Squibb, MD        Review of Systems  Constitution: Negative for decreased appetite, malaise/fatigue, weight gain and weight loss.  Eyes: Positive for photophobia. Negative for visual disturbance.  Cardiovascular: Positive for claudication (night cramps) and near-syncope. Negative for chest pain, dyspnea on exertion, leg swelling, orthopnea, palpitations and syncope.  Respiratory: Negative for hemoptysis and wheezing.   Endocrine: Negative for cold intolerance and heat intolerance.  Hematologic/Lymphatic: Does not bruise/bleed easily.  Skin: Negative for nail changes.  Musculoskeletal: Negative for muscle weakness and myalgias.  Gastrointestinal: Positive for change in bowel habit. Negative for abdominal pain, nausea and vomiting.  Neurological:  Positive for dizziness. Negative for difficulty with concentration, focal weakness and headaches.  Psychiatric/Behavioral: Negative for altered mental status and suicidal ideas.  All other systems reviewed and are negative.     Objective  There were no vitals taken for this visit. There is no height or weight on file to calculate BMI.      11/11/18 1306  11/11/18 1308  11/11/18 1309  Vital Signs  BP 150/82 131/77 175/92 140/84 148/75  Pulse Rate 110 111 99 98 108  BP Location Left Arm Left Arm Left Arm Left Arm Left Arm  BP Method     Automatic Automatic Automatic  Cuff Size Normal Normal Normal Normal Normal  Patient Position (if appropriate)     Lying Sitting Standing  Oxygen Therapy  SpO2 97 % 97 % 98 % 97 % 96 %    Physical Exam  Constitutional: She is oriented to person, place, and time. Vital signs are normal. She appears well-developed and well-nourished. No distress.  HENT:  Head: Normocephalic and atraumatic.  Eyes: Conjunctivae are normal.  Neck: Normal range of motion. Neck supple. No JVD present. No thyromegaly present.  Cardiovascular: Normal rate, regular rhythm, S1 normal, S2 normal, normal heart sounds and intact distal pulses. Exam reveals no gallop.    Midsystolic murmur is present. Aortic Area Pulses:      Carotid pulses are on the right side with bruit and on the left side with bruit.      Femoral pulses are on the right side with bruit and on the left side with bruit.      Popliteal pulses are 2+ on the left side.       Dorsalis pedis pulses are 1+ on the left side.       Posterior tibial pulses are 0 on the left side.  Right BKA noted  Pulmonary/Chest: Effort normal and breath sounds normal. No accessory muscle usage. No respiratory distress.  Abdominal: Soft. Bowel sounds are normal.  Epigastric bruit present, No prominent abdominal aortic pulsation.  Musculoskeletal: Normal range of motion.        General: Deformity (right BKA)  present. No edema.  Neurological: She is alert and oriented to person, place, and time.  Skin: Skin is warm and dry.  Lower Extremity Inspection - Left - Loss of hair and Shiny atrophic skin, No Ulcerations. Right - Inspection Normal(Right below knee amputation, stump has healed well). Palpation - Temperature - Left - Cool. Right - Normal.  Psychiatric: She has a normal mood and affect.  Vitals reviewed.  Laboratory examination:    CMP Latest Ref Rng & Units 06/28/2018 08/12/2016 12/11/2015  Glucose 70 - 99 mg/dL 102(H) - 148(H)  BUN 6 - 20 mg/dL 17 23(H) 11  Creatinine 0.44 - 1.00 mg/dL 1.01(H) 0.98 0.78  Sodium 135 - 145 mmol/L 139 - 137  Potassium 3.5 - 5.1 mmol/L 4.0 - 3.3(L)  Chloride 98 - 111 mmol/L 108 - 107  CO2 22 - 32 mmol/L 23 - 23  Calcium 8.9 - 10.3 mg/dL 9.0 - 8.1(L)  Total Protein 6.5 - 8.1 g/dL 6.5 - -  Total Bilirubin 0.3 - 1.2 mg/dL 0.4 - -  Alkaline Phos 38 - 126 U/L 39 - -  AST 15 - 41 U/L 19 - -  ALT 0 - 44 U/L 12 - -   CBC Latest Ref Rng & Units 06/28/2018 04/26/2016 04/12/2016  WBC 4.0 - 10.5 K/uL 3.1(L) - -  Hemoglobin 12.0 - 15.0 g/dL 11.4(L) 12.6 12.1  Hematocrit 36.0 - 46.0 % 38.5 - -  Platelets 150 - 400 K/uL 152 - -   Lipid Panel     Component Value Date/Time   CHOL 176 09/03/2018 1001   TRIG 88 09/03/2018 1001   HDL 51 09/03/2018 1001   CHOLHDL 3.5 09/03/2018 1001   CHOLHDL 3.2 03/30/2011 0158   VLDL 18 03/30/2011 0158   LDLCALC 107 (  H) 09/03/2018 1001   LDLDIRECT 220 (H) 03/31/2009 1955   HEMOGLOBIN A1C Lab Results  Component Value Date   HGBA1C 8.3 (H) 10/13/2015   MPG 192 10/13/2015   TSH No results for input(s): TSH in the last 8760 hours.  Cardiac Studies:   Peripheral arteriogram 08/12/2016: Percutaneous transluminal angioplasty of the entire left SFA and proximal popliteal artery with 4 mm diameter Lutonix drug-coated angioplasty balloons   Carotid artery duplex 10/29/2016: No hemodynamically significant arterial disease in  the internal carotid artery bilaterally. No significnat plaque burden noted bilaterally. Antegrade right vertebral artery flow. Antegrade left vertebral artery flow. No significant change from Carotid duplex 08/20/12.  PV Angio 10/06/2015: S/P Right EIA 6x39 and 6x60 mm balloon expandable and self expanding stent, 90% to 0%. Right SFA 85% to 0% with 5x80 mm Self expanding stent. Two vessel r/o with AT and diffusely disease Peroneal. No runoff at the ankle. Filled by faint collaterals. Right CIA stent placed 09/19/15, 6x14 mm stent patent.  [employer] study, Renal duplex 09/07/2013: Patent transplant vasculature, resistive indicis are increasing. The renal artery velocity at the anastomosis has increased from prior study, However this may indicate elevated velocity in the proximal iliac artery rather than presence of true stenosis.  Echocardiogram 03/30/2011: Reveals normal LV systolic function. Mild diastolic dysfunction. Trivial pericardial effusion.LVH.   Assessment   Syncope and collapse  Orthostatic hypotension  Diabetic autonomic neuropathy associated with type 2 diabetes mellitus ([place])  Peripheral artery disease ([place])  Renal transplant recipient  EKG 11/11/18: Sinus tachycardia cardiac rate of 104 beats minute, left atrial abnormality, otherwise normal EKG.  No evidence of ischemia, normal QT interval. No significant change from  EKG 10/22/2017.  Recommendations:   Patient being evaluated on an urgent basis due to recurrent episodes of syncope and near syncope.  Patient symptoms are related to severe orthostatic hypotension related to diabetic peripheral autonomic neuropathy.  Unfortunately she has supine hypertension and orthostatic hypotension which will very difficult to treat.  However I would rather except hypertension than hypotension.  These issues were discussed in detail with the patient. She is also developed frequent diarrhea related to probably diabetic autonomic  neuropathy.  She will continue taking her blood pressure medications, specifically amlodipine only if the systolic blood pressure is greater than or  Equal to 140 mmHg.  I will decrease labetalol to 50 mg b.i.d. dose from 300 mg p.o. b.i.d.  She has residual left leg severe PAD with mild symptoms of claudication and medical therapy recommended.    Adrian Prows, MD, [facility] 02/11/2019, 10:08 PM [facility]. [place] Pager: [phone] Office: [phone] If no answer Cell [phone]

## 2019-02-12 ENCOUNTER — Ambulatory Visit: Payer: Medicare Other | Admitting: Cardiology

## 2019-02-12 DIAGNOSIS — E119 Type 2 diabetes mellitus without complications: Secondary | ICD-10-CM | POA: Diagnosis not present

## 2019-02-24 DIAGNOSIS — I10 Essential (primary) hypertension: Secondary | ICD-10-CM | POA: Diagnosis not present

## 2019-02-24 DIAGNOSIS — R5383 Other fatigue: Secondary | ICD-10-CM | POA: Diagnosis not present

## 2019-02-24 DIAGNOSIS — E1165 Type 2 diabetes mellitus with hyperglycemia: Secondary | ICD-10-CM | POA: Diagnosis not present

## 2019-02-24 DIAGNOSIS — E7800 Pure hypercholesterolemia, unspecified: Secondary | ICD-10-CM | POA: Diagnosis not present

## 2019-02-24 DIAGNOSIS — R809 Proteinuria, unspecified: Secondary | ICD-10-CM | POA: Diagnosis not present

## 2019-03-01 DIAGNOSIS — Z940 Kidney transplant status: Secondary | ICD-10-CM | POA: Diagnosis not present

## 2019-03-01 DIAGNOSIS — N390 Urinary tract infection, site not specified: Secondary | ICD-10-CM | POA: Diagnosis not present

## 2019-03-01 DIAGNOSIS — I129 Hypertensive chronic kidney disease with stage 1 through stage 4 chronic kidney disease, or unspecified chronic kidney disease: Secondary | ICD-10-CM | POA: Diagnosis not present

## 2019-03-01 DIAGNOSIS — D631 Anemia in chronic kidney disease: Secondary | ICD-10-CM | POA: Diagnosis not present

## 2019-03-01 DIAGNOSIS — N182 Chronic kidney disease, stage 2 (mild): Secondary | ICD-10-CM | POA: Diagnosis not present

## 2019-03-01 DIAGNOSIS — N2581 Secondary hyperparathyroidism of renal origin: Secondary | ICD-10-CM | POA: Diagnosis not present

## 2019-03-08 DIAGNOSIS — R197 Diarrhea, unspecified: Secondary | ICD-10-CM | POA: Diagnosis not present

## 2019-03-14 DIAGNOSIS — E1165 Type 2 diabetes mellitus with hyperglycemia: Secondary | ICD-10-CM | POA: Diagnosis not present

## 2019-03-14 DIAGNOSIS — Z794 Long term (current) use of insulin: Secondary | ICD-10-CM | POA: Diagnosis not present

## 2019-03-15 DIAGNOSIS — E782 Mixed hyperlipidemia: Secondary | ICD-10-CM | POA: Diagnosis not present

## 2019-03-15 DIAGNOSIS — J4540 Moderate persistent asthma, uncomplicated: Secondary | ICD-10-CM | POA: Diagnosis not present

## 2019-03-15 DIAGNOSIS — Z794 Long term (current) use of insulin: Secondary | ICD-10-CM | POA: Diagnosis not present

## 2019-03-15 DIAGNOSIS — I10 Essential (primary) hypertension: Secondary | ICD-10-CM | POA: Diagnosis not present

## 2019-03-15 DIAGNOSIS — E1165 Type 2 diabetes mellitus with hyperglycemia: Secondary | ICD-10-CM | POA: Diagnosis not present

## 2019-03-22 ENCOUNTER — Ambulatory Visit (INDEPENDENT_AMBULATORY_CARE_PROVIDER_SITE_OTHER): Payer: Medicare Other | Admitting: Cardiology

## 2019-03-22 ENCOUNTER — Encounter: Payer: Self-pay | Admitting: Cardiology

## 2019-03-22 ENCOUNTER — Other Ambulatory Visit: Payer: Self-pay

## 2019-03-22 VITALS — BP 206/73 | HR 111 | Ht 60.0 in | Wt 156.6 lb

## 2019-03-22 DIAGNOSIS — R55 Syncope and collapse: Secondary | ICD-10-CM | POA: Diagnosis not present

## 2019-03-22 DIAGNOSIS — I10 Essential (primary) hypertension: Secondary | ICD-10-CM | POA: Diagnosis not present

## 2019-03-22 DIAGNOSIS — I951 Orthostatic hypotension: Secondary | ICD-10-CM | POA: Diagnosis not present

## 2019-03-22 DIAGNOSIS — E7800 Pure hypercholesterolemia, unspecified: Secondary | ICD-10-CM

## 2019-03-22 DIAGNOSIS — E1143 Type 2 diabetes mellitus with diabetic autonomic (poly)neuropathy: Secondary | ICD-10-CM | POA: Diagnosis not present

## 2019-03-22 MED ORDER — AMLODIPINE BESYLATE 10 MG PO TABS: 5.0000 mg | ORAL_TABLET | Freq: Two times a day (BID) | ORAL | 3 refills | Status: DC

## 2019-03-22 MED ORDER — LABETALOL HCL 100 MG PO TABS: 100.0000 mg | ORAL_TABLET | Freq: Two times a day (BID) | ORAL | 1 refills | Status: DC

## 2019-03-22 NOTE — Patient Instructions (Addendum)
Please check blood pressure on a daily basis.  If sitting blood pressure is less than 130 mmHg, please skip taking amlodipine.  If the sitting blood pressure is less than 110 mmHg, skip both amlodipine and also labetalol.   Monitor your blood pressure regularly.  Also try to check her blood pressure sitting and standing as well until the PICU on her next visit.

## 2019-03-22 NOTE — Progress Notes (Signed)
Primary Physician/Referring:  Benito Mccreedy, MD  Patient ID: [name], female    DOB: 1970-09-26, 48 y.o.   MRN: [id number]  Chief Complaint  Patient presents with   Loss of Consciousness    follow up   Hypertension   HPI:    NIMSI MALES  is a 48 y.o.  with renal transplantation due to diabetic and hypertensive renal disease on 01/23/2013 at [facility] and followed by Dr. Elmarie Shiley. She has HTN, orthostatic hypotension due to diabetic neuropathy, diabetic retinopathy and severe PAD with right common iliac artery stenting on 09/19/2015, again on 10/06/2015 had stenting of the right external iliac artery but eventually underwent RAKA in 2017 and walks with prosthesis. She has had left SFA angioplasty in Feb 2019 by Dr. Lucky Cowboy in [place].  She has had multiple episodes of syncope that started sometime in January 2020 and now there is a clear correlation between her syncope and frequent and recurrent UTI.  She had another episode of syncope on 03/02/2019 when she was having tachycardia UTI.  She now presents here for follow-up of orthostatic hypotension and systemic hypertension.  With regard to PAD she is remained stable.  Since being on insulin pump, diabetes is very well controlled.   Past Medical History:  Diagnosis Date   Anemia    CHF (congestive heart failure) ([place])    Critical lower limb ischemia 10/05/2015   GERD (gastroesophageal reflux disease)    High cholesterol    History of blood transfusion    related to "kidney transplant"   Hypercholesteremia 08/14/2006   Qualifier: Diagnosis of  By: Eusebio Friendly     Mechanical complication of other vascular device, implant, and graft 06/24/15   Metabolic bone disease    PAD (peripheral artery disease) ([place])    Pericardial effusion    Retinopathy    Type II diabetes mellitus ([place])    "controlled with diet"   Past Surgical History:  Procedure Laterality Date   ABOVE KNEE LEG AMPUTATION  Right    AMPUTATION Right 10/13/2015   Procedure: Right Transmetatarsal Amputation;  Surgeon: Newt Minion, MD;  Location: [place];  Service: Orthopedics;  Laterality: Right;   AMPUTATION Right 11/29/2015   Procedure: RIGHT BELOW KNEE AMPUTATION;  Surgeon: Newt Minion, MD;  Location: [place];  Service: Orthopedics;  Laterality: Right;   AMPUTATION FINGER     Rt hand middle finger   AV FISTULA PLACEMENT  10/04/2011   Procedure: INSERTION OF ARTERIOVENOUS (AV) GORE-TEX GRAFT ARM;  Surgeon: Angelia Mould, MD;  Location: [place];  Service: Vascular;  Laterality: Left;  Insertion left upper arm Arteriovenous goretex graft   AV FISTULA PLACEMENT  10/29/2011   Procedure: ARTERIOVENOUS (AV) FISTULA CREATION;  Surgeon: Angelia Mould, MD;  Location: [place];  Service: Vascular;  Laterality: Right;  Creation Right Arteriovenous Fistula    [place] REMOVAL  10/04/2011   Procedure: REMOVAL OF ARTERIOVENOUS GORETEX GRAFT ([place]);  Surgeon: Elam Dutch, MD;  Location: [facility] OR;  Service: Vascular;  Laterality: Left;   CHOLECYSTECTOMY N/A 12/08/2015   Procedure: LAPAROSCOPIC CHOLECYSTECTOMY WITH INTRAOPERATIVE CHOLANGIOGRAM;  Surgeon: Stark Klein, MD;  Location: [place];  Service: General;  Laterality: N/A;   ESOPHAGOGASTRODUODENOSCOPY N/A 12/24/2012   Procedure: ESOPHAGOGASTRODUODENOSCOPY (EGD);  Surgeon: Milus Banister, MD;  Location: [place];  Service: Endoscopy;  Laterality: N/A;   FINGER AMPUTATION Right 02/26/2013   "3rd finger"   INSERTION OF DIALYSIS CATHETER  10/04/2011   Procedure: INSERTION OF  DIALYSIS CATHETER;  Surgeon: Angelia Mould, MD;  Location: [place];  Service: Vascular;  Laterality: Right;  insertion of dialysis catheter right internal jugular   INSERTION OF DIALYSIS CATHETER  06/23/2012   Procedure: INSERTION OF DIALYSIS CATHETER;  Surgeon: Angelia Mould, MD;  Location: [place];  Service: Vascular;  Laterality: N/A;  Ultrasound guided   KIDNEY TRANSPLANT   01/24/2013   LOWER EXTREMITY ANGIOGRAPHY Left 08/12/2016   Procedure: Lower Extremity Angiography;  Surgeon: Algernon Huxley, MD;  Location: [place] [facility];  Service: Cardiovascular;  Laterality: Left;   LOWER EXTREMITY INTERVENTION  08/12/2016   Procedure: Lower Extremity Intervention;  Surgeon: Algernon Huxley, MD;  Location: [place] [facility];  Service: Cardiovascular;;   PATCH ANGIOPLASTY  06/23/2012   Procedure: PATCH ANGIOPLASTY;  Surgeon: Angelia Mould, MD;  Location: [place];  Service: Vascular;  Laterality: Right;   PERIPHERAL VASCULAR CATHETERIZATION N/A 09/19/2015   Procedure: Lower Extremity Angiography;  Surgeon: Adrian Prows, MD;  Location: [place] [facility];  Service: Cardiovascular;  Laterality: N/A;   PERIPHERAL VASCULAR CATHETERIZATION N/A 09/19/2015   Procedure: Abdominal Aortogram;  Surgeon: Adrian Prows, MD;  Location: [place] [facility];  Service: Cardiovascular;  Laterality: N/A;   PERIPHERAL VASCULAR CATHETERIZATION Right 09/19/2015   Procedure: Peripheral Vascular Intervention;  Surgeon: Adrian Prows, MD;  Location: [place] [facility];  Service: Cardiovascular;  Laterality: Right;  Right Common  Iliac   PERIPHERAL VASCULAR CATHETERIZATION N/A 10/06/2015   Procedure: Lower Extremity Angiography;  Surgeon: Adrian Prows, MD;  Location: [place] [facility];  Service: Cardiovascular;  Laterality: N/A;   PERIPHERAL VASCULAR CATHETERIZATION  10/06/2015   Procedure: Peripheral Vascular Intervention;  Surgeon: Adrian Prows, MD;  Location: [place] [facility];  Service: Cardiovascular;;  REIA  Omnilink 6.0x29, innova 7x80  RSFA innova 5x80   REVISON OF ARTERIOVENOUS FISTULA  06/23/2012   Procedure: REVISON OF ARTERIOVENOUS FISTULA;  Surgeon: Angelia Mould, MD;  Location: [place];  Service: Vascular;  Laterality: Right;  Ultrasound guided   SHUNTOGRAM N/A 04/06/2012   Procedure: Earney Mallet;  Surgeon: Angelia Mould, MD;  Location: [facility] C[facility] LAB;  Service: Cardiovascular;  Laterality:  N/A;   Social History   Socioeconomic History   Marital status: Single    Spouse name: Not on file   Number of children: 0   Years of education: Not on file   Highest education level: Not on file  Occupational History   Not on file  Social Needs   Financial resource strain: Not on file   Food insecurity    Worry: Not on file    Inability: Not on file   Transportation needs    Medical: Not on file    Non-medical: Not on file  Tobacco Use   Smoking status: Never Smoker   Smokeless tobacco: Never Used  Substance and Sexual Activity   Alcohol use: No   Drug use: No   Sexual activity: Never  Lifestyle   Physical activity    Days per week: Not on file    Minutes per session: Not on file   Stress: Not on file  Relationships   Social connections    Talks on phone: Not on file    Gets together: Not on file    Attends religious service: Not on file    Active member of club or organization: Not on file    Attends meetings of clubs or organizations: Not on file    Relationship status:  Not on file   Intimate partner violence    Fear of current or ex partner: Not on file    Emotionally abused: Not on file    Physically abused: Not on file    Forced sexual activity: Not on file  Other Topics Concern   Not on file  Social History Narrative   Not on file   ROS  Review of Systems  Constitution: Negative for decreased appetite, malaise/fatigue, weight gain and weight loss.  Eyes: Positive for photophobia. Negative for visual disturbance.  Cardiovascular: Positive for claudication (night cramps) and near-syncope. Negative for chest pain, dyspnea on exertion, leg swelling, orthopnea, palpitations and syncope.  Respiratory: Negative for hemoptysis and wheezing.   Endocrine: Negative for cold intolerance and heat intolerance.  Hematologic/Lymphatic: Does not bruise/bleed easily.  Skin: Negative for nail changes.  Musculoskeletal: Negative for muscle weakness and  myalgias.  Gastrointestinal: Positive for change in bowel habit. Negative for abdominal pain, nausea and vomiting.  Neurological: Positive for dizziness. Negative for difficulty with concentration, focal weakness and headaches.  Psychiatric/Behavioral: Negative for altered mental status and suicidal ideas.  All other systems reviewed and are negative.  Objective   Vitals with BMI 03/22/2019 03/22/2019 03/22/2019  Height - - 5\' 0"   Weight - - 156 lbs 10 oz  BMI - - 02.63  Systolic 785 885 027  Diastolic 73 741 287  Pulse 111 106 103    Blood pressure (!) 206/73, pulse (!) 111, height 5' (1.524 m), weight 156 lb 9.6 oz (71 kg), SpO2 99 %. Body mass index is 30.58 kg/m.   Physical Exam  Constitutional: She is oriented to person, place, and time. Vital signs are normal. She appears well-developed and well-nourished. No distress.  HENT:  Head: Normocephalic and atraumatic.  Eyes: Conjunctivae are normal.  Neck: Normal range of motion. Neck supple. No JVD present. No thyromegaly present.  Cardiovascular: Normal rate, regular rhythm, S1 normal, S2 normal, normal heart sounds and intact distal pulses. Exam reveals no gallop.    Midsystolic murmur is present. Aortic Area Pulses:      Carotid pulses are on the right side with bruit and on the left side with bruit.      Femoral pulses are on the right side with bruit and on the left side with bruit.      Popliteal pulses are 2+ on the left side.       Dorsalis pedis pulses are 1+ on the left side.       Posterior tibial pulses are 0 on the left side.  Right BKA noted  Pulmonary/Chest: Effort normal and breath sounds normal. No accessory muscle usage. No respiratory distress.  Abdominal: Soft. Bowel sounds are normal.  Epigastric bruit present, No prominent abdominal aortic pulsation.  Musculoskeletal: Normal range of motion.        General: Deformity (right BKA) present. No edema.  Neurological: She is alert and oriented to person, place,  and time.  Skin: Skin is warm and dry.  Lower Extremity Inspection - Left - Loss of hair and Shiny atrophic skin, No Ulcerations. Right - Inspection Normal(Right below knee amputation, stump has healed well). Palpation - Temperature - Left - Cool. Right - Normal.  Psychiatric: She has a normal mood and affect.  Vitals reviewed.  Radiology: No results found.  Laboratory examination:   Recent Labs    06/28/18 1207  NA 139  K 4.0  CL 108  CO2 23  GLUCOSE 102*  BUN 17  CREATININE  1.01*  CALCIUM 9.0  GFRNONAA >60  GFRAA >60   CMP Latest Ref Rng & Units 06/28/2018 08/12/2016 12/11/2015  Glucose 70 - 99 mg/dL 102(H) - 148(H)  BUN 6 - 20 mg/dL 17 23(H) 11  Creatinine 0.44 - 1.00 mg/dL 1.01(H) 0.98 0.78  Sodium 135 - 145 mmol/L 139 - 137  Potassium 3.5 - 5.1 mmol/L 4.0 - 3.3(L)  Chloride 98 - 111 mmol/L 108 - 107  CO2 22 - 32 mmol/L 23 - 23  Calcium 8.9 - 10.3 mg/dL 9.0 - 8.1(L)  Total Protein 6.5 - 8.1 g/dL 6.5 - -  Total Bilirubin 0.3 - 1.2 mg/dL 0.4 - -  Alkaline Phos 38 - 126 U/L 39 - -  AST 15 - 41 U/L 19 - -  ALT 0 - 44 U/L 12 - -   CBC Latest Ref Rng & Units 06/28/2018 04/26/2016 04/12/2016  WBC 4.0 - 10.5 K/uL 3.1(L) - -  Hemoglobin 12.0 - 15.0 g/dL 11.4(L) 12.6 12.1  Hematocrit 36.0 - 46.0 % 38.5 - -  Platelets 150 - 400 K/uL 152 - -   Lipid Panel     Component Value Date/Time   CHOL 176 09/03/2018 1001   TRIG 88 09/03/2018 1001   HDL 51 09/03/2018 1001   CHOLHDL 3.5 09/03/2018 1001   CHOLHDL 3.2 03/30/2011 0158   VLDL 18 03/30/2011 0158   LDLCALC 107 (H) 09/03/2018 1001   LDLDIRECT 220 (H) 03/31/2009 1955   HEMOGLOBIN A1C Lab Results  Component Value Date   HGBA1C 8.3 (H) 10/13/2015   MPG 192 10/13/2015   TSH No results for input(s): TSH in the last 8760 hours. Medications and allergies  No Known Allergies   Prior to Admission medications   Medication Sig Start Date End Date Taking? Authorizing Provider  albuterol (ACCUNEB) 0.63 MG/3ML nebulizer  solution 1 ampule 3 (three) times daily as needed.  11/07/18  Yes [provider]  albuterol (PROVENTIL HFA;VENTOLIN HFA) 108 (90 BASE) MCG/ACT inhaler Inhale 1 puff into the lungs every 6 (six) hours as needed for wheezing or shortness of breath.   Yes [provider]  amLODipine (NORVASC) 10 MG tablet Take 0.5 tablets (5 mg total) by mouth 2 (two) times daily. If sitting BP >140/80 mm Hg 03/22/19 09/18/19 Yes Adrian Prows, MD  betamethasone dipropionate (DIPROLENE) 0.05 % cream  10/26/17  Yes [provider]  furosemide (LASIX) 40 MG tablet Take 40 mg by mouth daily.  12/25/16  Yes [provider]  insulin aspart (NOVOLOG) 100 UNIT/ML injection Inject 0-16 Units into the skin. Pt on insulin pump.  Total of 16 units per day.   Yes [provider]  labetalol (NORMODYNE) 100 MG tablet Take 1 tablet (100 mg total) by mouth 2 (two) times daily. 03/22/19  Yes Adrian Prows, MD  mycophenolate (MYFORTIC) 180 MG EC tablet Take 540 mg by mouth 2 (two) times daily. (540mg ) in the morning and bedtime-( 3 capsules )   Yes [provider]  pantoprazole (PROTONIX) 40 MG tablet Take 40 mg by mouth at bedtime.    Yes [provider]  rosuvastatin (CRESTOR) 10 MG tablet Take 1 tablet (10 mg total) by mouth daily. 09/16/18  Yes Adrian Prows, MD  sulfamethoxazole-trimethoprim (BACTRIM,SEPTRA) 400-80 MG per tablet Take 1 tablet by mouth every Monday, Wednesday, and Friday.    Yes Reeves-Daniel, Amber, DO  tacrolimus (PROGRAF) 1 MG capsule Take 2 mg by mouth 2 (two) times daily.    Yes [provider]  cetirizine (ZYRTEC) 10 MG tablet Take 10 mg by mouth daily.    [provider]     Current Outpatient Medications  Medication Instructions   albuterol (ACCUNEB) 0.63 MG/3ML nebulizer solution 1 ampule, 3 times daily PRN   albuterol (PROVENTIL HFA;VENTOLIN HFA) 108 (90 BASE) MCG/ACT inhaler 1 puff, Inhalation, Every 6 hours PRN   amLODipine (NORVASC) 5  mg, Oral, 2 times daily, If sitting BP >140/80 mm Hg   betamethasone dipropionate (DIPROLENE) 0.05 % cream No dose, route, or frequency recorded.   cetirizine (ZYRTEC) 10 mg, Oral, Daily   furosemide (LASIX) 40 mg, Oral, Daily   insulin aspart (NOVOLOG) 0-16 Units, Subcutaneous, Pt on insulin pump.  Total of 16 units per day.   labetalol (NORMODYNE) 100 mg, Oral, 2 times daily   mycophenolate (MYFORTIC) 540 mg, Oral, 2 times daily, (540mg ) in the morning and bedtime-( 3 capsules )   pantoprazole (PROTONIX) 40 mg, Oral, Daily at bedtime   rosuvastatin (CRESTOR) 10 mg, Oral, Daily   sulfamethoxazole-trimethoprim (BACTRIM,SEPTRA) 400-80 MG per tablet 1 tablet, Oral, Every M-W-F   tacrolimus (PROGRAF) 2 mg, Oral, 2 times daily    Cardiac Studies:   Echocardiogram 03/30/2011: Reveals normal LV systolic function. Mild diastolic dysfunction. Trivial pericardial effusion.LVH.   [employer] study, Renal duplex 09/07/2013: Patent transplant vasculature, resistive indicis are increasing. The renal artery velocity at the anastomosis has increased from prior study, However this may indicate elevated velocity in the proximal iliac artery rather than presence of true stenosis.  PV Angio 10/06/2015: S/P Right EIA 6x39 and 6x60 mm balloon expandable and self expanding stent, 90% to 0%. Right SFA 85% to 0% with 5x80 mm Self expanding stent. Two vessel r/o with AT and diffusely disease Peroneal. No runoff at the ankle. Filled by faint collaterals. Right CIA stent placed 09/19/15, 6x14 mm stent patent.  Carotid artery duplex 10/29/2016: No hemodynamically significant arterial disease in the internal carotid artery bilaterally. No significnat plaque burden noted bilaterally. Antegrade right vertebral artery flow. Antegrade left vertebral artery flow. No significant change from Carotid duplex 08/20/12.  Peripheral arteriogram 08/12/2016: Percutaneous transluminal angioplasty of the entire left SFA and  proximal popliteal artery with 4 mm diameter Lutonix drug-coated angioplasty balloons   Assessment     ICD-10-CM   1. Syncope and collapse  R55 EKG 12-Lead  2. Orthostatic hypotension  I95.1   3. Essential hypertension  I10 amLODipine (NORVASC) 10 MG tablet    labetalol (NORMODYNE) 100 MG tablet  4. Diabetic autonomic neuropathy associated with type 2 diabetes mellitus (HCC)  E11.43   5. Hypercholesteremia  E78.00     EKG 03/22/2019: Sinus tachycardia at rate of 102 bpm, left atrial enlargement, normal axis.  Poor R-wave progression, probably normal variant.  No evidence of ischemia.  Normal QT interval. No significant change from  EKG 11/11/18: Sinus tachycardia cardiac rate of 104 beats.  Recommendations:   Patient here on a two-month office follow-up of hypertension and orthostatic hypotension.  Patient's recurrent episodes of syncope clearly correlate with recurrent UTI.  I have discussed with the patient to check her blood pressure regularly, also if systolic blood pressure is less than 140 mmHg, she'll only take 5 mg of amlodipine and if systolic blood pressures less than 120 mmHg she'll completely hold amlodipine and also hold labetalol.  She has severe orthostatic hypotension.  On her last office visit 3 months ago, I had reduce the dose of amlodipine from 10-5 mg, also had reduced her labetalol from 300 mg  p.o. b.i.d. due to frequent syncope to the present dose of 50 mg b.i.d.  I will increase the labetalol dosage to 100 mg b.i.d. today.  I'll like to see her back in 4 weeks for follow-up.  Diabetes is improved significantly.  To avoid injury, advised her to have bedside commode especially at night. I do not have her recent lipid profile and if LDL > 70, we should increase Crestor to 20 mg, will send note to Dr. Vista Lawman.   Adrian Prows, MD, [facility] 03/22/2019, 3:02 PM [facility] [place] Pager: [phone] Office: [phone] If no answer Cell [phone]

## 2019-04-05 ENCOUNTER — Other Ambulatory Visit: Payer: Self-pay

## 2019-04-09 DIAGNOSIS — J4540 Moderate persistent asthma, uncomplicated: Secondary | ICD-10-CM | POA: Diagnosis not present

## 2019-04-09 DIAGNOSIS — E1165 Type 2 diabetes mellitus with hyperglycemia: Secondary | ICD-10-CM | POA: Diagnosis not present

## 2019-04-09 DIAGNOSIS — E782 Mixed hyperlipidemia: Secondary | ICD-10-CM | POA: Diagnosis not present

## 2019-04-09 DIAGNOSIS — Z794 Long term (current) use of insulin: Secondary | ICD-10-CM | POA: Diagnosis not present

## 2019-04-09 DIAGNOSIS — I10 Essential (primary) hypertension: Secondary | ICD-10-CM | POA: Diagnosis not present

## 2019-04-13 DIAGNOSIS — E1165 Type 2 diabetes mellitus with hyperglycemia: Secondary | ICD-10-CM | POA: Diagnosis not present

## 2019-04-13 DIAGNOSIS — Z794 Long term (current) use of insulin: Secondary | ICD-10-CM | POA: Diagnosis not present

## 2019-04-19 ENCOUNTER — Other Ambulatory Visit (INDEPENDENT_AMBULATORY_CARE_PROVIDER_SITE_OTHER): Payer: Self-pay | Admitting: Vascular Surgery

## 2019-04-19 DIAGNOSIS — M2042 Other hammer toe(s) (acquired), left foot: Secondary | ICD-10-CM | POA: Diagnosis not present

## 2019-04-19 DIAGNOSIS — I739 Peripheral vascular disease, unspecified: Secondary | ICD-10-CM

## 2019-04-19 DIAGNOSIS — B351 Tinea unguium: Secondary | ICD-10-CM | POA: Diagnosis not present

## 2019-04-19 DIAGNOSIS — L84 Corns and callosities: Secondary | ICD-10-CM | POA: Diagnosis not present

## 2019-04-21 ENCOUNTER — Other Ambulatory Visit: Payer: Self-pay

## 2019-04-21 ENCOUNTER — Encounter: Payer: Self-pay | Admitting: Cardiology

## 2019-04-21 ENCOUNTER — Ambulatory Visit (INDEPENDENT_AMBULATORY_CARE_PROVIDER_SITE_OTHER): Payer: Medicare Other | Admitting: Cardiology

## 2019-04-21 VITALS — BP 150/73 | HR 100 | Ht 60.0 in | Wt 155.0 lb

## 2019-04-21 DIAGNOSIS — I10 Essential (primary) hypertension: Secondary | ICD-10-CM

## 2019-04-21 DIAGNOSIS — I739 Peripheral vascular disease, unspecified: Secondary | ICD-10-CM

## 2019-04-21 DIAGNOSIS — I951 Orthostatic hypotension: Secondary | ICD-10-CM | POA: Diagnosis not present

## 2019-04-21 DIAGNOSIS — E1142 Type 2 diabetes mellitus with diabetic polyneuropathy: Secondary | ICD-10-CM | POA: Diagnosis not present

## 2019-04-21 DIAGNOSIS — Z940 Kidney transplant status: Secondary | ICD-10-CM

## 2019-04-21 DIAGNOSIS — E7800 Pure hypercholesterolemia, unspecified: Secondary | ICD-10-CM

## 2019-04-21 MED ORDER — LABETALOL HCL 200 MG PO TABS: 200.0000 mg | ORAL_TABLET | Freq: Two times a day (BID) | ORAL | 3 refills | Status: DC

## 2019-04-21 NOTE — Progress Notes (Signed)
Primary Physician/Referring:  Benito Mccreedy, MD  Patient ID: [name], female    DOB: 05-Dec-1970, 48 y.o.   MRN: [id number]  Chief Complaint  Patient presents with  . Hypertension    ortho & essential  . Follow-up    4wk   HPI:    [name]  is a 48 y.o.  with renal transplantation due to diabetic and hypertensive renal disease on 01/23/2013 at [facility] and followed by Dr. Elmarie Shiley. She has HTN, orthostatic hypotension due to diabetic neuropathy, diabetic retinopathy and severe PAD with right common iliac artery stenting on 09/19/2015, again on 10/06/2015 had stenting of the right external iliac artery but eventually underwent RAKA in 2017 and walks with prosthesis. She has had left SFA angioplasty in Feb 2019 by Dr. Lucky Cowboy in [place].  She has had multiple episodes of syncope that started sometime in January 2020 and now there is a clear correlation between her syncope and frequent and recurrent UTI.  She had another episode of syncope on 03/02/2019 when she was having tachycardia UTI. She is now scheduled to see urologist.  She now presents here for follow-up of orthostatic hypotension and systemic hypertension.  With regard to PAD she is remained stable.  Since being on insulin pump, diabetes is very well controlled. States she is doing well and has had occasional dizziness but no leg edema, no further syncope. Claudication stable. No ulceration.  Past Medical History:  Diagnosis Date  . Anemia   . CHF (congestive heart failure) ([place])   . Critical lower limb ischemia 10/05/2015  . GERD (gastroesophageal reflux disease)   . High cholesterol   . History of blood transfusion    related to "kidney transplant"  . Hypercholesteremia 08/14/2006   Qualifier: Diagnosis of  By: Eusebio Friendly    . Mechanical complication of other vascular device, implant, and graft 09/22/2012  . Metabolic bone disease   . PAD (peripheral artery disease) ([place])   . Pericardial  effusion   . Retinopathy   . Type II diabetes mellitus ([place])    "controlled with diet"   Past Surgical History:  Procedure Laterality Date  . ABOVE KNEE LEG AMPUTATION Right   . AMPUTATION Right 10/13/2015   Procedure: Right Transmetatarsal Amputation;  Surgeon: Newt Minion, MD;  Location: [employer];  Service: Orthopedics;  Laterality: Right;  . AMPUTATION Right 11/29/2015   Procedure: RIGHT BELOW KNEE AMPUTATION;  Surgeon: Newt Minion, MD;  Location: [place];  Service: Orthopedics;  Laterality: Right;  . AMPUTATION FINGER     Rt hand middle finger  . AV FISTULA PLACEMENT  10/04/2011   Procedure: INSERTION OF ARTERIOVENOUS (AV) GORE-TEX GRAFT ARM;  Surgeon: Angelia Mould, MD;  Location: [place];  Service: Vascular;  Laterality: Left;  Insertion left upper arm Arteriovenous goretex graft  . AV FISTULA PLACEMENT  10/29/2011   Procedure: ARTERIOVENOUS (AV) FISTULA CREATION;  Surgeon: Angelia Mould, MD;  Location: [facility] OR;  Service: Vascular;  Laterality: Right;  Creation Right Arteriovenous Fistula   . [place] REMOVAL  10/04/2011   Procedure: REMOVAL OF ARTERIOVENOUS GORETEX GRAFT ([place]);  Surgeon: Elam Dutch, MD;  Location: [place];  Service: Vascular;  Laterality: Left;  . CHOLECYSTECTOMY N/A 12/08/2015   Procedure: LAPAROSCOPIC CHOLECYSTECTOMY WITH INTRAOPERATIVE CHOLANGIOGRAM;  Surgeon: Stark Klein, MD;  Location: [place];  Service: General;  Laterality: N/A;  . ESOPHAGOGASTRODUODENOSCOPY N/A 12/24/2012   Procedure: ESOPHAGOGASTRODUODENOSCOPY (EGD);  Surgeon: Milus Banister, MD;  Location[facility]  ENDOSCOPY;  Service: Endoscopy;  Laterality: N/A;  . FINGER AMPUTATION Right 02/26/2013   "3rd finger"  . INSERTION OF DIALYSIS CATHETER  10/04/2011   Procedure: INSERTION OF DIALYSIS CATHETER;  Surgeon: Angelia Mould, MD;  Location: [place];  Service: Vascular;  Laterality: Right;  insertion of dialysis catheter right internal jugular  . INSERTION OF DIALYSIS CATHETER  06/23/2012    Procedure: INSERTION OF DIALYSIS CATHETER;  Surgeon: Angelia Mould, MD;  Location: [place];  Service: Vascular;  Laterality: N/A;  Ultrasound guided  . KIDNEY TRANSPLANT  01/24/2013  . LOWER EXTREMITY ANGIOGRAPHY Left 08/12/2016   Procedure: Lower Extremity Angiography;  Surgeon: Algernon Huxley, MD;  Location: [place] [facility];  Service: Cardiovascular;  Laterality: Left;  . LOWER EXTREMITY INTERVENTION  08/12/2016   Procedure: Lower Extremity Intervention;  Surgeon: Algernon Huxley, MD;  Location: [place] [facility];  Service: Cardiovascular;;  . PATCH ANGIOPLASTY  06/23/2012   Procedure: PATCH ANGIOPLASTY;  Surgeon: Angelia Mould, MD;  Location: [place];  Service: Vascular;  Laterality: Right;  . PERIPHERAL VASCULAR CATHETERIZATION N/A 09/19/2015   Procedure: Lower Extremity Angiography;  Surgeon: Adrian Prows, MD;  Location: [place] [facility];  Service: Cardiovascular;  Laterality: N/A;  . PERIPHERAL VASCULAR CATHETERIZATION N/A 09/19/2015   Procedure: Abdominal Aortogram;  Surgeon: Adrian Prows, MD;  Location: [place] [facility];  Service: Cardiovascular;  Laterality: N/A;  . PERIPHERAL VASCULAR CATHETERIZATION Right 09/19/2015   Procedure: Peripheral Vascular Intervention;  Surgeon: Adrian Prows, MD;  Location: [place] [facility];  Service: Cardiovascular;  Laterality: Right;  Right Common  Iliac  . PERIPHERAL VASCULAR CATHETERIZATION N/A 10/06/2015   Procedure: Lower Extremity Angiography;  Surgeon: Adrian Prows, MD;  Location: [place] [facility];  Service: Cardiovascular;  Laterality: N/A;  . PERIPHERAL VASCULAR CATHETERIZATION  10/06/2015   Procedure: Peripheral Vascular Intervention;  Surgeon: Adrian Prows, MD;  Location: [place] [facility];  Service: Cardiovascular;;  REIA  Omnilink 6.0x29, innova 7x80  RSFA innova 5x80  . REVISON OF ARTERIOVENOUS FISTULA  06/23/2012   Procedure: REVISON OF ARTERIOVENOUS FISTULA;  Surgeon: Angelia Mould, MD;  Location: [place];  Service: Vascular;  Laterality:  Right;  Ultrasound guided  . SHUNTOGRAM N/A 04/06/2012   Procedure: Earney Mallet;  Surgeon: Angelia Mould, MD;  Location: [facility] CATH LAB;  Service: Cardiovascular;  Laterality: N/A;   Social History   Socioeconomic History  . Marital status: Single    Spouse name: Not on file  . Number of children: 0  . Years of education: Not on file  . Highest education level: Not on file  Occupational History  . Not on file  Social Needs  . Financial resource strain: Not on file  . Food insecurity    Worry: Not on file    Inability: Not on file  . Transportation needs    Medical: Not on file    Non-medical: Not on file  Tobacco Use  . Smoking status: Never Smoker  . Smokeless tobacco: Never Used  Substance and Sexual Activity  . Alcohol use: No  . Drug use: No  . Sexual activity: Never  Lifestyle  . Physical activity    Days per week: Not on file    Minutes per session: Not on file  . Stress: Not on file  Relationships  . Social Herbalist on phone: Not on file    Gets together: Not on file    Attends religious service: Not on  file    Active member of club or organization: Not on file    Attends meetings of clubs or organizations: Not on file    Relationship status: Not on file  . Intimate partner violence    Fear of current or ex partner: Not on file    Emotionally abused: Not on file    Physically abused: Not on file    Forced sexual activity: Not on file  Other Topics Concern  . Not on file  Social History Narrative  . Not on file   ROS  Review of Systems  Constitution: Negative for decreased appetite, malaise/fatigue, weight gain and weight loss.  Eyes: Negative for visual disturbance.  Cardiovascular: Positive for claudication (night cramps). Negative for chest pain, dyspnea on exertion, leg swelling, orthopnea, palpitations and syncope.  Respiratory: Negative for hemoptysis and wheezing.   Endocrine: Negative for cold intolerance and heat intolerance.   Hematologic/Lymphatic: Does not bruise/bleed easily.  Skin: Negative for nail changes.  Musculoskeletal: Negative for muscle weakness and myalgias.  Gastrointestinal: Positive for change in bowel habit. Negative for abdominal pain, nausea and vomiting.  Neurological: Negative for difficulty with concentration, dizziness, focal weakness and headaches.  Psychiatric/Behavioral: Negative for altered mental status and suicidal ideas.  All other systems reviewed and are negative.  Objective   Vitals with BMI 04/21/2019 04/21/2019 04/21/2019  Height 5\' 0"  5\' 0"  5\' 0"   Weight 155 lbs 155 lbs 155 lbs 13 oz  BMI 30.27 16.07 37.10  Systolic 626 948 546  Diastolic 73 77 79  Pulse 270 99 102    Blood pressure (!) 150/73, pulse 100, height 5' (1.524 m), weight 155 lb (70.3 kg), SpO2 99 %. Body mass index is 30.27 kg/m.   Physical Exam  Constitutional: She is oriented to person, place, and time. Vital signs are normal. She appears well-developed and well-nourished. No distress.  HENT:  Head: Normocephalic and atraumatic.  Eyes: Conjunctivae are normal.  Neck: Normal range of motion. Neck supple. No JVD present. No thyromegaly present.  Cardiovascular: Normal rate, regular rhythm, S1 normal, S2 normal, normal heart sounds and intact distal pulses. Exam reveals no gallop.    Midsystolic murmur is present. Aortic Area Pulses:      Carotid pulses are on the right side with bruit and on the left side with bruit.      Femoral pulses are on the right side with bruit and on the left side with bruit.      Popliteal pulses are 2+ on the left side.       Dorsalis pedis pulses are 1+ on the left side.       Posterior tibial pulses are 0 on the left side.  Right BKA noted  Pulmonary/Chest: Effort normal and breath sounds normal. No accessory muscle usage. No respiratory distress.  Abdominal: Soft. Bowel sounds are normal.  Epigastric bruit present, No prominent abdominal aortic pulsation.  Musculoskeletal:  Normal range of motion.        General: Deformity (right BKA) present. No edema.  Neurological: She is alert and oriented to person, place, and time.  Skin: Skin is warm and dry.  Lower Extremity Inspection - Left - Loss of hair and Shiny atrophic skin, No Ulcerations. Right - Inspection Normal(Right below knee amputation, stump has healed well). Palpation - Temperature - Left - Cool. Right - Normal.  Psychiatric: She has a normal mood and affect.  Vitals reviewed.  Radiology: No results found.  Laboratory examination:   Recent Labs  06/28/18 1207  NA 139  K 4.0  CL 108  CO2 23  GLUCOSE 102*  BUN 17  CREATININE 1.01*  CALCIUM 9.0  GFRNONAA >60  GFRAA >60   CMP Latest Ref Rng & Units 06/28/2018 08/12/2016 12/11/2015  Glucose 70 - 99 mg/dL 102(H) - 148(H)  BUN 6 - 20 mg/dL 17 23(H) 11  Creatinine 0.44 - 1.00 mg/dL 1.01(H) 0.98 0.78  Sodium 135 - 145 mmol/L 139 - 137  Potassium 3.5 - 5.1 mmol/L 4.0 - 3.3(L)  Chloride 98 - 111 mmol/L 108 - 107  CO2 22 - 32 mmol/L 23 - 23  Calcium 8.9 - 10.3 mg/dL 9.0 - 8.1(L)  Total Protein 6.5 - 8.1 g/dL 6.5 - -  Total Bilirubin 0.3 - 1.2 mg/dL 0.4 - -  Alkaline Phos 38 - 126 U/L 39 - -  AST 15 - 41 U/L 19 - -  ALT 0 - 44 U/L 12 - -   CBC Latest Ref Rng & Units 06/28/2018 04/26/2016 04/12/2016  WBC 4.0 - 10.5 K/uL 3.1(L) - -  Hemoglobin 12.0 - 15.0 g/dL 11.4(L) 12.6 12.1  Hematocrit 36.0 - 46.0 % 38.5 - -  Platelets 150 - 400 K/uL 152 - -   Lipid Panel     Component Value Date/Time   CHOL 176 09/03/2018 1001   TRIG 88 09/03/2018 1001   HDL 51 09/03/2018 1001   CHOLHDL 3.5 09/03/2018 1001   CHOLHDL 3.2 03/30/2011 0158   VLDL 18 03/30/2011 0158   LDLCALC 107 (H) 09/03/2018 1001   LDLDIRECT 220 (H) 03/31/2009 1955   HEMOGLOBIN A1C Lab Results  Component Value Date   HGBA1C 8.3 (H) 10/13/2015   MPG 192 10/13/2015   TSH No results for input(s): TSH in the last 8760 hours. Medications and allergies  No Known Allergies    Prior to Admission medications   Medication Sig Start Date End Date Taking? Authorizing Provider  albuterol (ACCUNEB) 0.63 MG/3ML nebulizer solution 1 ampule 3 (three) times daily as needed.  11/07/18  Yes [provider]  albuterol (PROVENTIL HFA;VENTOLIN HFA) 108 (90 BASE) MCG/ACT inhaler Inhale 1 puff into the lungs every 6 (six) hours as needed for wheezing or shortness of breath.   Yes [provider]  amLODipine (NORVASC) 10 MG tablet Take 0.5 tablets (5 mg total) by mouth 2 (two) times daily. If sitting BP >140/80 mm Hg 03/22/19 09/18/19 Yes Adrian Prows, MD  betamethasone dipropionate (DIPROLENE) 0.05 % cream  10/26/17  Yes [provider]  furosemide (LASIX) 40 MG tablet Take 40 mg by mouth daily.  12/25/16  Yes [provider]  insulin aspart (NOVOLOG) 100 UNIT/ML injection Inject 0-16 Units into the skin. Pt on insulin pump.  Total of 16 units per day.   Yes [provider]  labetalol (NORMODYNE) 100 MG tablet Take 1 tablet (100 mg total) by mouth 2 (two) times daily. 03/22/19  Yes Adrian Prows, MD  mycophenolate (MYFORTIC) 180 MG EC tablet Take 540 mg by mouth 2 (two) times daily. (540mg ) in the morning and bedtime-( 3 capsules )   Yes [provider]  pantoprazole (PROTONIX) 40 MG tablet Take 40 mg by mouth at bedtime.    Yes [provider]  rosuvastatin (CRESTOR) 10 MG tablet Take 1 tablet (10 mg total) by mouth daily. 09/16/18  Yes Adrian Prows, MD  sulfamethoxazole-trimethoprim (BACTRIM,SEPTRA) 400-80 MG per tablet Take 1 tablet by mouth every Monday, Wednesday, and Friday.    Yes Reeves-Daniel, Amber, DO  tacrolimus (PROGRAF) 1 MG capsule Take 2 mg by mouth 2 (two) times daily.    Yes [provider]  cetirizine (ZYRTEC) 10 MG tablet Take 10 mg by mouth daily.    [provider]     Current Outpatient Medications  Medication Instructions  . albuterol (ACCUNEB) 0.63 MG/3ML nebulizer solution 1 ampule, 3 times daily  PRN  . albuterol (PROVENTIL HFA;VENTOLIN HFA) 108 (90 BASE) MCG/ACT inhaler 1 puff, Inhalation, Every 6 hours PRN  . amLODipine (NORVASC) 5 mg, Oral, 2 times daily, If sitting BP >140/80 mm Hg  . furosemide (LASIX) 40 mg, Oral, Daily  . insulin aspart (NOVOLOG) 0-16 Units, Subcutaneous, Pt on insulin pump.  Total of 16 units per day.  . labetalol (NORMODYNE) 100 mg, Oral, 2 times daily  . mycophenolate (MYFORTIC) 540 mg, Oral, 2 times daily, (540mg ) in the morning and bedtime-( 3 capsules )  . pantoprazole (PROTONIX) 40 mg, Oral, Daily at bedtime  . rosuvastatin (CRESTOR) 10 mg, Oral, Daily  . sulfamethoxazole-trimethoprim (BACTRIM,SEPTRA) 400-80 MG per tablet 1 tablet, Oral, Every M-W-F  . tacrolimus (PROGRAF) 2 mg, Oral, 2 times daily   Cardiac Studies:   Echocardiogram 03/30/2011: Reveals normal LV systolic function. Mild diastolic dysfunction. Trivial pericardial effusion.LVH.   [employer] study, Renal duplex 09/07/2013: Patent transplant vasculature, resistive indicis are increasing. The renal artery velocity at the anastomosis has increased from prior study, However this may indicate elevated velocity in the proximal iliac artery rather than presence of true stenosis.  PV Angio 10/06/2015: S/P Right EIA 6x39 and 6x60 mm balloon expandable and self expanding stent, 90% to 0%. Right SFA 85% to 0% with 5x80 mm Self expanding stent. Two vessel r/o with AT and diffusely disease Peroneal. No runoff at the ankle. Filled by faint collaterals. Right CIA stent placed 09/19/15, 6x14 mm stent patent.  Carotid artery duplex 10/29/2016: No hemodynamically significant arterial disease in the internal carotid artery bilaterally. No significnat plaque burden noted bilaterally. Antegrade right vertebral artery flow. Antegrade left vertebral artery flow. No significant change from Carotid duplex 08/20/12.  Peripheral arteriogram 08/12/2016: Percutaneous transluminal angioplasty of the entire left SFA  and proximal popliteal artery with 4 mm diameter Lutonix drug-coated angioplasty balloons   Assessment     ICD-10-CM   1. Essential hypertension  I10   2. Orthostatic hypotension  I95.1   3. Renal transplant recipient  Z94.0   4. Type 2 diabetes mellitus with peripheral neuropathy (HCC)  E11.42   5. Peripheral artery disease ([employer])  I73.9     EKG 03/22/2019: Sinus tachycardia at rate of 102 bpm, left atrial enlargement, normal axis.  Poor R-wave progression, probably normal variant.  No evidence of ischemia.  Normal QT interval. No significant change from  EKG 11/11/18: Sinus tachycardia cardiac rate of 104 beats.  Recommendations:   Patient here on a one-month office follow-up of hypertension and orthostatic hypotension and near syncope and syncope. No further episodes since last OV.   Seen at one-month follow-up, due to recurrent episodes of near syncope and marked dizziness, on her last office visit I suspected recurrent UTI to be the etiology for her syncopal episodes.  She is now seeing a urologist for the same.  Blood pressure is much improved since being on labetalol.  Patient is also on furosemide 40 mg daily, I do not see an indication for this, as her renal function is fairly stable, advised her to discontinue this which may help with orthostatic hypotension as well.  We'll increase  labetalol to 200 mg b.i.d.  She has responded well to this.  With regard to peripheral arterial disease, she still continues to have mild symptoms, we will only intervene for limb threatening ischemia.  Diabetes still continues to be uncontrolled but improving.  Her direct LDL is markedly elevated.  I'm not sure whether this is a true value.  In spite of this in view of renal issues and also severe PAD, her target LDL is closer to 70 if not 40.  I will repeat lipid profile testing, with target her lipids on her next office visit.  I suspect she may need PCSK 9 inhibitors.  Adrian Prows, MD, [facility]  04/21/2019, 3:57 PM [facility] [place] Pager: [phone] Office: [phone] If no answer Cell [phone]

## 2019-04-27 DIAGNOSIS — Z89611 Acquired absence of right leg above knee: Secondary | ICD-10-CM | POA: Diagnosis not present

## 2019-04-27 DIAGNOSIS — Z79899 Other long term (current) drug therapy: Secondary | ICD-10-CM | POA: Diagnosis not present

## 2019-04-27 DIAGNOSIS — Z940 Kidney transplant status: Secondary | ICD-10-CM | POA: Diagnosis not present

## 2019-04-27 DIAGNOSIS — N390 Urinary tract infection, site not specified: Secondary | ICD-10-CM | POA: Diagnosis not present

## 2019-04-27 DIAGNOSIS — Z794 Long term (current) use of insulin: Secondary | ICD-10-CM | POA: Diagnosis not present

## 2019-04-27 DIAGNOSIS — E1165 Type 2 diabetes mellitus with hyperglycemia: Secondary | ICD-10-CM | POA: Diagnosis not present

## 2019-04-28 DIAGNOSIS — N390 Urinary tract infection, site not specified: Secondary | ICD-10-CM | POA: Diagnosis not present

## 2019-04-28 DIAGNOSIS — Z940 Kidney transplant status: Secondary | ICD-10-CM | POA: Diagnosis not present

## 2019-05-06 DIAGNOSIS — E1165 Type 2 diabetes mellitus with hyperglycemia: Secondary | ICD-10-CM | POA: Diagnosis not present

## 2019-05-06 DIAGNOSIS — Z23 Encounter for immunization: Secondary | ICD-10-CM | POA: Diagnosis not present

## 2019-05-06 DIAGNOSIS — Z9641 Presence of insulin pump (external) (internal): Secondary | ICD-10-CM | POA: Diagnosis not present

## 2019-05-06 DIAGNOSIS — E7800 Pure hypercholesterolemia, unspecified: Secondary | ICD-10-CM | POA: Diagnosis not present

## 2019-05-06 DIAGNOSIS — I10 Essential (primary) hypertension: Secondary | ICD-10-CM | POA: Diagnosis not present

## 2019-05-11 ENCOUNTER — Encounter (INDEPENDENT_AMBULATORY_CARE_PROVIDER_SITE_OTHER): Payer: Medicare Other

## 2019-05-11 ENCOUNTER — Ambulatory Visit (INDEPENDENT_AMBULATORY_CARE_PROVIDER_SITE_OTHER): Payer: Medicare Other | Admitting: Nurse Practitioner

## 2019-05-11 DIAGNOSIS — N390 Urinary tract infection, site not specified: Secondary | ICD-10-CM | POA: Diagnosis not present

## 2019-05-11 DIAGNOSIS — Z940 Kidney transplant status: Secondary | ICD-10-CM | POA: Diagnosis not present

## 2019-05-11 DIAGNOSIS — Z87440 Personal history of urinary (tract) infections: Secondary | ICD-10-CM | POA: Diagnosis not present

## 2019-05-14 DIAGNOSIS — E1165 Type 2 diabetes mellitus with hyperglycemia: Secondary | ICD-10-CM | POA: Diagnosis not present

## 2019-05-14 DIAGNOSIS — Z794 Long term (current) use of insulin: Secondary | ICD-10-CM | POA: Diagnosis not present

## 2019-05-16 ENCOUNTER — Other Ambulatory Visit: Payer: Self-pay | Admitting: Cardiology

## 2019-05-16 DIAGNOSIS — I10 Essential (primary) hypertension: Secondary | ICD-10-CM

## 2019-05-17 DIAGNOSIS — N390 Urinary tract infection, site not specified: Secondary | ICD-10-CM | POA: Diagnosis not present

## 2019-05-20 ENCOUNTER — Ambulatory Visit (INDEPENDENT_AMBULATORY_CARE_PROVIDER_SITE_OTHER): Payer: Medicare Other | Admitting: Nurse Practitioner

## 2019-05-20 ENCOUNTER — Encounter (INDEPENDENT_AMBULATORY_CARE_PROVIDER_SITE_OTHER): Payer: Medicare Other

## 2019-06-02 ENCOUNTER — Ambulatory Visit (INDEPENDENT_AMBULATORY_CARE_PROVIDER_SITE_OTHER): Payer: Medicare Other | Admitting: Nurse Practitioner

## 2019-06-02 ENCOUNTER — Encounter: Payer: Self-pay | Admitting: Cardiology

## 2019-06-02 ENCOUNTER — Encounter (INDEPENDENT_AMBULATORY_CARE_PROVIDER_SITE_OTHER): Payer: Self-pay | Admitting: Nurse Practitioner

## 2019-06-02 ENCOUNTER — Other Ambulatory Visit: Payer: Self-pay

## 2019-06-02 ENCOUNTER — Ambulatory Visit (INDEPENDENT_AMBULATORY_CARE_PROVIDER_SITE_OTHER): Payer: Medicare Other

## 2019-06-02 ENCOUNTER — Ambulatory Visit (INDEPENDENT_AMBULATORY_CARE_PROVIDER_SITE_OTHER): Payer: Medicare Other | Admitting: Cardiology

## 2019-06-02 VITALS — BP 146/78 | HR 100 | Ht 60.0 in | Wt 170.8 lb

## 2019-06-02 VITALS — BP 175/93 | HR 97 | Resp 16 | Ht 60.0 in | Wt 170.0 lb

## 2019-06-02 DIAGNOSIS — Z940 Kidney transplant status: Secondary | ICD-10-CM

## 2019-06-02 DIAGNOSIS — N186 End stage renal disease: Secondary | ICD-10-CM | POA: Diagnosis not present

## 2019-06-02 DIAGNOSIS — I951 Orthostatic hypotension: Secondary | ICD-10-CM | POA: Diagnosis not present

## 2019-06-02 DIAGNOSIS — E7800 Pure hypercholesterolemia, unspecified: Secondary | ICD-10-CM

## 2019-06-02 DIAGNOSIS — I10 Essential (primary) hypertension: Secondary | ICD-10-CM

## 2019-06-02 DIAGNOSIS — I739 Peripheral vascular disease, unspecified: Secondary | ICD-10-CM | POA: Diagnosis not present

## 2019-06-02 NOTE — Progress Notes (Signed)
Primary Physician/Referring:  Benito Mccreedy, MD  Patient ID: [name], female    DOB: 1971-03-16, 48 y.o.   MRN: [id number]  Chief Complaint  Patient presents with  . Hypertension  . Hyperlipidemia  . Loss of Consciousness   HPI:    [name]  is a 48 y.o.  with renal transplantation due to diabetic and hypertensive renal disease on 01/23/2013 at [facility] and followed by Dr. Elmarie Shiley. She has Essential HTN, orthostatic hypotension due to diabetic neuropathy, diabetic retinopathy and severe PAD with right common iliac artery stenting on 09/19/2015, again on 10/06/2015 had stenting of the right external iliac artery but eventually underwent RAKA in 2017 and walks with prosthesis. She has had left SFA angioplasty in Feb 2019 by Dr. Lucky Cowboy in [place].  She has had multiple episodes of syncope that started sometime in January 2020, last episode on 03/02/2019 when she was having tachycardia UTI. Syncope felt to be related to recurrent UTI and she has seen urology for this.   States she is doing well and has had occasional dizziness, Has not had any further. Claudication stable. No ulceration.  She now presents here for follow-up of hyperlipidemia.  In view of her severe PAD, LDL goal is less than 40 mg%. Presents to review labs and also f/u on hypertension. In view of severe orthostatic hypotension, only standing BP is being treated to goal.   Unfortunately she continues to gain weight, she did not get her labs done today.  Upon presentation her systolic blood pressure was 211 to mercury, improved to 146 mmHg.  Past Medical History:  Diagnosis Date  . Anemia   . CHF (congestive heart failure) ([place])   . Critical lower limb ischemia 10/05/2015  . GERD (gastroesophageal reflux disease)   . High cholesterol   . History of blood transfusion    related to "kidney transplant"  . Hypercholesteremia 08/14/2006   Qualifier: Diagnosis of  By: Eusebio Friendly    . Mechanical  complication of other vascular device, implant, and graft 09/22/2012  . Metabolic bone disease   . PAD (peripheral artery disease) ([place])   . Pericardial effusion   . Retinopathy   . Type II diabetes mellitus ([place])    "controlled with diet"   Past Surgical History:  Procedure Laterality Date  . ABOVE KNEE LEG AMPUTATION Right   . AMPUTATION Right 10/13/2015   Procedure: Right Transmetatarsal Amputation;  Surgeon: Newt Minion, MD;  Location: [place];  Service: Orthopedics;  Laterality: Right;  . AMPUTATION Right 11/29/2015   Procedure: RIGHT BELOW KNEE AMPUTATION;  Surgeon: Newt Minion, MD;  Location: [place];  Service: Orthopedics;  Laterality: Right;  . AMPUTATION FINGER     Rt hand middle finger  . AV FISTULA PLACEMENT  10/04/2011   Procedure: INSERTION OF ARTERIOVENOUS (AV) GORE-TEX GRAFT ARM;  Surgeon: Angelia Mould, MD;  Location: [place];  Service: Vascular;  Laterality: Left;  Insertion left upper arm Arteriovenous goretex graft  . AV FISTULA PLACEMENT  10/29/2011   Procedure: ARTERIOVENOUS (AV) FISTULA CREATION;  Surgeon: Angelia Mould, MD;  Location: [facility] OR;  Service: Vascular;  Laterality: Right;  Creation Right Arteriovenous Fistula   . [place] REMOVAL  10/04/2011   Procedure: REMOVAL OF ARTERIOVENOUS GORETEX GRAFT ([place]);  Surgeon: Elam Dutch, MD;  Location: [place];  Service: Vascular;  Laterality: Left;  . CHOLECYSTECTOMY N/A 12/08/2015   Procedure: LAPAROSCOPIC CHOLECYSTECTOMY WITH INTRAOPERATIVE CHOLANGIOGRAM;  Surgeon: Stark Klein, MD;  Location: [facility] OR;  Service: General;  Laterality: N/A;  . ESOPHAGOGASTRODUODENOSCOPY N/A 12/24/2012   Procedure: ESOPHAGOGASTRODUODENOSCOPY (EGD);  Surgeon: Milus Banister, MD;  Location: [place];  Service: Endoscopy;  Laterality: N/A;  . FINGER AMPUTATION Right 02/26/2013   "3rd finger"  . INSERTION OF DIALYSIS CATHETER  10/04/2011   Procedure: INSERTION OF DIALYSIS CATHETER;  Surgeon: Angelia Mould, MD;  Location: [place];  Service: Vascular;  Laterality: Right;  insertion of dialysis catheter right internal jugular  . INSERTION OF DIALYSIS CATHETER  06/23/2012   Procedure: INSERTION OF DIALYSIS CATHETER;  Surgeon: Angelia Mould, MD;  Location: [place];  Service: Vascular;  Laterality: N/A;  Ultrasound guided  . KIDNEY TRANSPLANT  01/24/2013  . LOWER EXTREMITY ANGIOGRAPHY Left 08/12/2016   Procedure: Lower Extremity Angiography;  Surgeon: Algernon Huxley, MD;  Location: [place] CV [facility];  Service: Cardiovascular;  Laterality: Left;  . LOWER EXTREMITY INTERVENTION  08/12/2016   Procedure: Lower Extremity Intervention;  Surgeon: Algernon Huxley, MD;  Location: [place] [facility];  Service: Cardiovascular;;  . PATCH ANGIOPLASTY  06/23/2012   Procedure: PATCH ANGIOPLASTY;  Surgeon: Angelia Mould, MD;  Location: [place];  Service: Vascular;  Laterality: Right;  . PERIPHERAL VASCULAR CATHETERIZATION N/A 09/19/2015   Procedure: Lower Extremity Angiography;  Surgeon: Adrian Prows, MD;  Location: [place] [facility];  Service: Cardiovascular;  Laterality: N/A;  . PERIPHERAL VASCULAR CATHETERIZATION N/A 09/19/2015   Procedure: Abdominal Aortogram;  Surgeon: Adrian Prows, MD;  Location: [place] [facility];  Service: Cardiovascular;  Laterality: N/A;  . PERIPHERAL VASCULAR CATHETERIZATION Right 09/19/2015   Procedure: Peripheral Vascular Intervention;  Surgeon: Adrian Prows, MD;  Location: [place] [facility];  Service: Cardiovascular;  Laterality: Right;  Right Common  Iliac  . PERIPHERAL VASCULAR CATHETERIZATION N/A 10/06/2015   Procedure: Lower Extremity Angiography;  Surgeon: Adrian Prows, MD;  Location: [place] [facility];  Service: Cardiovascular;  Laterality: N/A;  . PERIPHERAL VASCULAR CATHETERIZATION  10/06/2015   Procedure: Peripheral Vascular Intervention;  Surgeon: Adrian Prows, MD;  Location: [place] [facility];  Service: Cardiovascular;;  REIA  Omnilink 6.0x29, innova 7x80  RSFA innova 5x80  . REVISON OF ARTERIOVENOUS FISTULA   06/23/2012   Procedure: REVISON OF ARTERIOVENOUS FISTULA;  Surgeon: Angelia Mould, MD;  Location: [place];  Service: Vascular;  Laterality: Right;  Ultrasound guided  . SHUNTOGRAM N/A 04/06/2012   Procedure: Earney Mallet;  Surgeon: Angelia Mould, MD;  Location: [facility] CATH LAB;  Service: Cardiovascular;  Laterality: N/A;   Social History   Socioeconomic History  . Marital status: Single    Spouse name: Not on file  . Number of children: 0  . Years of education: Not on file  . Highest education level: Not on file  Occupational History  . Not on file  Tobacco Use  . Smoking status: Never Smoker  . Smokeless tobacco: Never Used  Substance and Sexual Activity  . Alcohol use: No  . Drug use: No  . Sexual activity: Never  Other Topics Concern  . Not on file  Social History Narrative  . Not on file   Social Determinants of Health   Financial Resource Strain:   . Difficulty of Paying Living Expenses: Not on file  Food Insecurity:   . Worried About Charity fundraiser in the Last Year: Not on file  . Ran Out of Food in the Last Year: Not on file  Transportation Needs:   . Lack of  Transportation (Medical): Not on file  . Lack of Transportation (Non-Medical): Not on file  Physical Activity:   . Days of Exercise per Week: Not on file  . Minutes of Exercise per Session: Not on file  Stress:   . Feeling of Stress : Not on file  Social Connections:   . Frequency of Communication with Friends and Family: Not on file  . Frequency of Social Gatherings with Friends and Family: Not on file  . Attends Religious Services: Not on file  . Active Member of Clubs or Organizations: Not on file  . Attends Archivist Meetings: Not on file  . Marital Status: Not on file  Intimate Partner Violence:   . Fear of Current or Ex-Partner: Not on file  . Emotionally Abused: Not on file  . Physically Abused: Not on file  . Sexually Abused: Not on file   ROS  Review of Systems    Constitution: Negative for decreased appetite, malaise/fatigue, weight gain and weight loss.  Eyes: Negative for visual disturbance.  Cardiovascular: Positive for claudication (night cramps). Negative for chest pain, dyspnea on exertion, leg swelling, orthopnea, palpitations and syncope.  Respiratory: Negative for hemoptysis and wheezing.   Endocrine: Negative for cold intolerance and heat intolerance.  Hematologic/Lymphatic: Does not bruise/bleed easily.  Skin: Negative for nail changes.  Musculoskeletal: Negative for muscle weakness and myalgias.  Gastrointestinal: Negative for abdominal pain, change in bowel habit, nausea and vomiting.  Neurological: Negative for difficulty with concentration, dizziness, focal weakness and headaches.  Psychiatric/Behavioral: Negative for altered mental status and suicidal ideas.  All other systems reviewed and are negative.  Objective   Vitals with BMI 06/02/2019 06/02/2019 04/21/2019  Height 5\' 0"  5\' 0"  5\' 0"   Weight 170 lbs 13 oz 170 lbs 155 lbs  BMI 33.36 09.3 23.55  Systolic 732 202 542  Diastolic 78 93 73  Pulse 706 97 100    Blood pressure (!) 146/78, pulse 100, height 5' (1.524 m), weight 170 lb 12.8 oz (77.5 kg), SpO2 100 %. Body mass index is 33.36 kg/m.   Physical Exam  Constitutional: She is oriented to person, place, and time. Vital signs are normal.  She is shortened stager and mildly obese in no acute distress.  HENT:  Head: Normocephalic and atraumatic.  Eyes: Conjunctivae are normal.  Neck: No JVD present. No thyromegaly present.  Cardiovascular: Normal rate, regular rhythm, S1 normal, S2 normal, normal heart sounds and intact distal pulses. Exam reveals no gallop.    Midsystolic murmur is present. Aortic Area Pulses:      Carotid pulses are on the right side with bruit and on the left side with bruit.      Femoral pulses are on the right side with bruit and on the left side with bruit.      Popliteal pulses are 2+ on the  left side.       Dorsalis pedis pulses are 1+ on the left side.       Posterior tibial pulses are 0 on the left side.  Right BKA noted  Pulmonary/Chest: Effort normal and breath sounds normal. No accessory muscle usage. No respiratory distress.  Abdominal: Soft. Bowel sounds are normal.  Epigastric bruit present, No prominent abdominal aortic pulsation.  Musculoskeletal:        General: Deformity (right BKA) present. No edema. Normal range of motion.     Cervical back: Normal range of motion and neck supple.  Neurological: She is alert and oriented to person, place,  and time.  Skin: Skin is warm and dry.  Lower Extremity Inspection - Left - Loss of hair and Shiny atrophic skin, No Ulcerations. Right - Inspection Normal(Right below knee amputation, stump has healed well). Palpation - Temperature - Left - Cool. Right - Normal.  Psychiatric: She has a normal mood and affect.  Vitals reviewed.  Radiology: No results found.  Laboratory examination:   Recent Labs    06/28/18 1207  NA 139  K 4.0  CL 108  CO2 23  GLUCOSE 102*  BUN 17  CREATININE 1.01*  CALCIUM 9.0  GFRNONAA >60  GFRAA >60   CMP Latest Ref Rng & Units 06/28/2018 08/12/2016 12/11/2015  Glucose 70 - 99 mg/dL 102(H) - 148(H)  BUN 6 - 20 mg/dL 17 23(H) 11  Creatinine 0.44 - 1.00 mg/dL 1.01(H) 0.98 0.78  Sodium 135 - 145 mmol/L 139 - 137  Potassium 3.5 - 5.1 mmol/L 4.0 - 3.3(L)  Chloride 98 - 111 mmol/L 108 - 107  CO2 22 - 32 mmol/L 23 - 23  Calcium 8.9 - 10.3 mg/dL 9.0 - 8.1(L)  Total Protein 6.5 - 8.1 g/dL 6.5 - -  Total Bilirubin 0.3 - 1.2 mg/dL 0.4 - -  Alkaline Phos 38 - 126 U/L 39 - -  AST 15 - 41 U/L 19 - -  ALT 0 - 44 U/L 12 - -   CBC Latest Ref Rng & Units 06/28/2018 04/26/2016 04/12/2016  WBC 4.0 - 10.5 K/uL 3.1(L) - -  Hemoglobin 12.0 - 15.0 g/dL 11.4(L) 12.6 12.1  Hematocrit 36.0 - 46.0 % 38.5 - -  Platelets 150 - 400 K/uL 152 - -   Lipid Panel     Component Value Date/Time   CHOL 176 09/03/2018  1001   TRIG 88 09/03/2018 1001   HDL 51 09/03/2018 1001   CHOLHDL 3.5 09/03/2018 1001   CHOLHDL 3.2 03/30/2011 0158   VLDL 18 03/30/2011 0158   LDLCALC 107 (H) 09/03/2018 1001   LDLDIRECT 220 (H) 03/31/2009 1955   HEMOGLOBIN A1C Lab Results  Component Value Date   HGBA1C 8.3 (H) 10/13/2015   MPG 192 10/13/2015   TSH No results for input(s): TSH in the last 8760 hours. Medications and allergies  No Known Allergies   Prior to Admission medications   Medication Sig Start Date End Date Taking? Authorizing Provider  albuterol (ACCUNEB) 0.63 MG/3ML nebulizer solution 1 ampule 3 (three) times daily as needed.  11/07/18  Yes [provider]  albuterol (PROVENTIL HFA;VENTOLIN HFA) 108 (90 BASE) MCG/ACT inhaler Inhale 1 puff into the lungs every 6 (six) hours as needed for wheezing or shortness of breath.   Yes [provider]  amLODipine (NORVASC) 10 MG tablet Take 0.5 tablets (5 mg total) by mouth 2 (two) times daily. If sitting BP >140/80 mm Hg 03/22/19 09/18/19 Yes Adrian Prows, MD  betamethasone dipropionate (DIPROLENE) 0.05 % cream  10/26/17  Yes [provider]  furosemide (LASIX) 40 MG tablet Take 40 mg by mouth daily.  12/25/16  Yes [provider]  insulin aspart (NOVOLOG) 100 UNIT/ML injection Inject 0-16 Units into the skin. Pt on insulin pump.  Total of 16 units per day.   Yes [provider]  labetalol (NORMODYNE) 100 MG tablet Take 1 tablet (100 mg total) by mouth 2 (two) times daily. 03/22/19  Yes Adrian Prows, MD  mycophenolate (MYFORTIC) 180 MG EC tablet Take 540 mg by mouth 2 (two) times daily. (540mg ) in the morning and bedtime-( 3 capsules )  Yes [provider]  pantoprazole (PROTONIX) 40 MG tablet Take 40 mg by mouth at bedtime.    Yes [provider]  rosuvastatin (CRESTOR) 10 MG tablet Take 1 tablet (10 mg total) by mouth daily. 09/16/18  Yes Adrian Prows, MD  sulfamethoxazole-trimethoprim (BACTRIM,SEPTRA) 400-80 MG per  tablet Take 1 tablet by mouth every Monday, Wednesday, and Friday.    Yes Reeves-Daniel, Amber, DO  tacrolimus (PROGRAF) 1 MG capsule Take 2 mg by mouth 2 (two) times daily.    Yes [provider]  cetirizine (ZYRTEC) 10 MG tablet Take 10 mg by mouth daily.    [provider]     Current Outpatient Medications  Medication Instructions  . albuterol (ACCUNEB) 0.63 MG/3ML nebulizer solution 1 ampule, 3 times daily PRN  . albuterol (PROVENTIL HFA;VENTOLIN HFA) 108 (90 BASE) MCG/ACT inhaler 1 puff, Inhalation, Every 6 hours PRN  . amLODipine (NORVASC) 5 mg, Oral, 2 times daily, If sitting BP >140/80 mm Hg  . amLODipine (NORVASC) 10 mg, Oral, Daily  . furosemide (LASIX) 40 mg, Oral, Daily PRN, Starting today 04/21/19. Was on daily dose  . insulin aspart (NOVOLOG) 0-16 Units, Subcutaneous, Pt on insulin pump.  Total of 16 units per day.  . labetalol (NORMODYNE) 200 mg, Oral, 2 times daily  . labetalol (NORMODYNE) 100 mg, Oral, 2 times daily  . methenamine (HIPREX) 1 g, Oral, 2 times daily with meals  . mycophenolate (MYFORTIC) 540 mg, Oral, 2 times daily, (540mg ) in the morning and bedtime-( 3 capsules )  . pantoprazole (PROTONIX) 40 mg, Oral, Daily at bedtime  . rosuvastatin (CRESTOR) 10 mg, Oral, Daily  . tacrolimus (PROGRAF) 2 mg, Oral, 2 times daily   Cardiac Studies:   Echocardiogram 03/30/2011: Reveals normal LV systolic function. Mild diastolic dysfunction. Trivial pericardial effusion.LVH.   [employer] study, Renal duplex 09/07/2013: Patent transplant vasculature, resistive indicis are increasing. The renal artery velocity at the anastomosis has increased from prior study, However this may indicate elevated velocity in the proximal iliac artery rather than presence of true stenosis.  PV Angio 10/06/2015: S/P Right EIA 6x39 and 6x60 mm balloon expandable and self expanding stent, 90% to 0%. Right SFA 85% to 0% with 5x80 mm Self expanding stent. Two vessel r/o with AT  and diffusely disease Peroneal. No runoff at the ankle. Filled by faint collaterals. Right CIA stent placed 09/19/15, 6x14 mm stent patent.  Carotid artery duplex 10/29/2016: No hemodynamically significant arterial disease in the internal carotid artery bilaterally. No significnat plaque burden noted bilaterally. Antegrade right vertebral artery flow. Antegrade left vertebral artery flow. No significant change from Carotid duplex 08/20/12.  Peripheral arteriogram 08/12/2016: Percutaneous transluminal angioplasty of the entire left SFA and proximal popliteal artery with 4 mm diameter Lutonix drug-coated angioplasty balloons   Assessment     ICD-10-CM   1. Essential hypertension  I10   2. Orthostatic hypotension  I95.1   3. Hypercholesteremia  E78.00     EKG 03/22/2019: Sinus tachycardia at rate of 102 bpm, left atrial enlargement, normal axis.  Poor R-wave progression, probably normal variant.  No evidence of ischemia.  Normal QT interval. No significant change from  EKG 11/11/18: Sinus tachycardia cardiac rate of 104 beats.  Recommendations:   [name]  is a 48 y.o.  with renal transplantation due to diabetic and hypertensive renal disease on 01/23/2013 at [facility] and followed by Dr. Elmarie Shiley. She has Essential HTN, orthostatic hypotension due to diabetic neuropathy, diabetic retinopathy and severe PAD  with right common iliac artery stenting on 09/19/2015, again on 10/06/2015 had stenting of the right external iliac artery but eventually underwent RAKA in 2017 and walks with prosthesis. She has had left SFA angioplasty in Feb 2019 by Dr. Lucky Cowboy in [place].  She has had multiple episodes of syncope that started sometime in January 2020, last episode on 03/02/2019 when she was having tachycardia UTI. Syncope felt to be related to recurrent UTI and she has seen urology for this and now on antibiotics.    In view of severe orthostatic hypotension, only standing BP is being treated to  goal. She now presents here for follow-up of hyperlipidemia and hypertension.  In view of her severe PAD, LDL goal is less than 40 mg%. He has not had any recurrence of syncope, I did not make changes to her medication.  She's been gaining weight, weight loss was discussed extensively with the patient.  On 09/03/2018, her LDL was 107, she was recommended repeating lipids prior to her office visit.  Lipids have not been performed.  No changes in the medications were done today.  We'll advise her to obtain lipids and in view of severe PAD, her target LDL is closer to 70 if not 40.  I would like to see her back in 4 weeks to improve compliance with weight loss, again to reevaluate her blood pressure and reevaluate lipid status.  She'll obtain labs tomorrow morning ( direct LDL and LP(a).   Adrian Prows, MD, [facility] 06/02/2019, 4:48 PM [facility] [place] Pager: [phone] Office: [phone] If no answer Cell [phone]

## 2019-06-03 DIAGNOSIS — E7800 Pure hypercholesterolemia, unspecified: Secondary | ICD-10-CM | POA: Diagnosis not present

## 2019-06-05 LAB — LIPOPROTEIN A (LPA): Lipoprotein (a): <8.4 nmol/L (ref <75.0)

## 2019-06-05 LAB — LIPID PANEL WITH LDL/HDL RATIO
Cholesterol, Total: 171 mg/dL (ref 100–199)
HDL: 88 mg/dL (ref >39)
LDL Chol Calc (NIH): 70 mg/dL (ref 0–99)
LDL/HDL Ratio: 0.8 ratio (ref 0.0–3.2)
Triglycerides: 71 mg/dL (ref 0–149)
VLDL Cholesterol Cal: 13 mg/dL (ref 5–40)

## 2019-06-09 ENCOUNTER — Encounter (INDEPENDENT_AMBULATORY_CARE_PROVIDER_SITE_OTHER): Payer: Self-pay | Admitting: Nurse Practitioner

## 2019-06-09 NOTE — Progress Notes (Signed)
SUBJECTIVE:  Patient ID: [name], female    DOB: Jul 02, 1970, 48 y.o.   MRN: [id number] Chief Complaint  Patient presents with  . Follow-up    ultrasound    HPI  [name] is a 48 y.o. female that is presenting today for evaluation of her right upper extremity brachiocephalic AV fistula.  Currently her AV fistula has not been utilized since 2014.  She received a new kidney in 2014 which she reports is having no issues.  The patient also previously had a DRIL procedure.  The patient has some numbness in the upper extremity however it is tolerable at this time.  She denies any claudication-like symptoms.  Currently the patient has a flow volume of 1031.  The fistula is patent as well as DRIL graft with no evidence of significant stenosis  Past Medical History:  Diagnosis Date  . Anemia   . CHF (congestive heart failure) ([place])   . Critical lower limb ischemia 10/05/2015  . GERD (gastroesophageal reflux disease)   . High cholesterol   . History of blood transfusion    related to "kidney transplant"  . Hypercholesteremia 08/14/2006   Qualifier: Diagnosis of  By: Eusebio Friendly    . Mechanical complication of other vascular device, implant, and graft 09/22/2012  . Metabolic bone disease   . PAD (peripheral artery disease) ([place])   . Pericardial effusion   . Retinopathy   . Type II diabetes mellitus ([place])    "controlled with diet"    Past Surgical History:  Procedure Laterality Date  . ABOVE KNEE LEG AMPUTATION Right   . AMPUTATION Right 10/13/2015   Procedure: Right Transmetatarsal Amputation;  Surgeon: Newt Minion, MD;  Location: [place];  Service: Orthopedics;  Laterality: Right;  . AMPUTATION Right 11/29/2015   Procedure: RIGHT BELOW KNEE AMPUTATION;  Surgeon: Newt Minion, MD;  Location: [place];  Service: Orthopedics;  Laterality: Right;  . AMPUTATION FINGER     Rt hand middle finger  . AV FISTULA PLACEMENT  10/04/2011   Procedure: INSERTION OF ARTERIOVENOUS (AV)  GORE-TEX GRAFT ARM;  Surgeon: Angelia Mould, MD;  Location: [place];  Service: Vascular;  Laterality: Left;  Insertion left upper arm Arteriovenous goretex graft  . AV FISTULA PLACEMENT  10/29/2011   Procedure: ARTERIOVENOUS (AV) FISTULA CREATION;  Surgeon: Angelia Mould, MD;  Location: [place] [facility];  Service: Vascular;  Laterality: Right;  Creation Right Arteriovenous Fistula   . [place] REMOVAL  10/04/2011   Procedure: REMOVAL OF ARTERIOVENOUS GORETEX GRAFT ([place]);  Surgeon: Elam Dutch, MD;  Location: [place];  Service: Vascular;  Laterality: Left;  . CHOLECYSTECTOMY N/A 12/08/2015   Procedure: LAPAROSCOPIC CHOLECYSTECTOMY WITH INTRAOPERATIVE CHOLANGIOGRAM;  Surgeon: Stark Klein, MD;  Location: [place];  Service: General;  Laterality: N/A;  . ESOPHAGOGASTRODUODENOSCOPY N/A 12/24/2012   Procedure: ESOPHAGOGASTRODUODENOSCOPY (EGD);  Surgeon: Milus Banister, MD;  Location: [place];  Service: Endoscopy;  Laterality: N/A;  . FINGER AMPUTATION Right 02/26/2013   "3rd finger"  . INSERTION OF DIALYSIS CATHETER  10/04/2011   Procedure: INSERTION OF DIALYSIS CATHETER;  Surgeon: Angelia Mould, MD;  Location: [place];  Service: Vascular;  Laterality: Right;  insertion of dialysis catheter right internal jugular  . INSERTION OF DIALYSIS CATHETER  06/23/2012   Procedure: INSERTION OF DIALYSIS CATHETER;  Surgeon: Angelia Mould, MD;  Location: [place];  Service: Vascular;  Laterality: N/A;  Ultrasound guided  . KIDNEY TRANSPLANT  01/24/2013  . LOWER EXTREMITY ANGIOGRAPHY  Left 08/12/2016   Procedure: Lower Extremity Angiography;  Surgeon: Algernon Huxley, MD;  Location: [place] [facility];  Service: Cardiovascular;  Laterality: Left;  . LOWER EXTREMITY INTERVENTION  08/12/2016   Procedure: Lower Extremity Intervention;  Surgeon: Algernon Huxley, MD;  Location: [place] [facility];  Service: Cardiovascular;;  . PATCH ANGIOPLASTY  06/23/2012   Procedure: PATCH ANGIOPLASTY;  Surgeon: Angelia Mould,  MD;  Location: [place];  Service: Vascular;  Laterality: Right;  . PERIPHERAL VASCULAR CATHETERIZATION N/A 09/19/2015   Procedure: Lower Extremity Angiography;  Surgeon: Adrian Prows, MD;  Location: [place] [facility];  Service: Cardiovascular;  Laterality: N/A;  . PERIPHERAL VASCULAR CATHETERIZATION N/A 09/19/2015   Procedure: Abdominal Aortogram;  Surgeon: Adrian Prows, MD;  Location: [place] [facility];  Service: Cardiovascular;  Laterality: N/A;  . PERIPHERAL VASCULAR CATHETERIZATION Right 09/19/2015   Procedure: Peripheral Vascular Intervention;  Surgeon: Adrian Prows, MD;  Location: [place] [facility];  Service: Cardiovascular;  Laterality: Right;  Right Common  Iliac  . PERIPHERAL VASCULAR CATHETERIZATION N/A 10/06/2015   Procedure: Lower Extremity Angiography;  Surgeon: Adrian Prows, MD;  Location: [place] [facility];  Service: Cardiovascular;  Laterality: N/A;  . PERIPHERAL VASCULAR CATHETERIZATION  10/06/2015   Procedure: Peripheral Vascular Intervention;  Surgeon: Adrian Prows, MD;  Location: [place] [facility];  Service: Cardiovascular;;  REIA  Omnilink 6.0x29, innova 7x80  RSFA innova 5x80  . REVISON OF ARTERIOVENOUS FISTULA  06/23/2012   Procedure: REVISON OF ARTERIOVENOUS FISTULA;  Surgeon: Angelia Mould, MD;  Location: [place];  Service: Vascular;  Laterality: Right;  Ultrasound guided  . SHUNTOGRAM N/A 04/06/2012   Procedure: Earney Mallet;  Surgeon: Angelia Mould, MD;  Location: [facility];  Service: Cardiovascular;  Laterality: N/A;    Social History   Socioeconomic History  . Marital status: Single    Spouse name: Not on file  . Number of children: 0  . Years of education: Not on file  . Highest education level: Not on file  Occupational History  . Not on file  Tobacco Use  . Smoking status: Never Smoker  . Smokeless tobacco: Never Used  Substance and Sexual Activity  . Alcohol use: No  . Drug use: No  . Sexual activity: Never  Other Topics Concern  . Not on file  Social  History Narrative  . Not on file   Social Determinants of Health   Financial Resource Strain:   . Difficulty of Paying Living Expenses: Not on file  Food Insecurity:   . Worried About Charity fundraiser in the Last Year: Not on file  . Ran Out of Food in the Last Year: Not on file  Transportation Needs:   . Lack of Transportation (Medical): Not on file  . Lack of Transportation (Non-Medical): Not on file  Physical Activity:   . Days of Exercise per Week: Not on file  . Minutes of Exercise per Session: Not on file  Stress:   . Feeling of Stress : Not on file  Social Connections:   . Frequency of Communication with Friends and Family: Not on file  . Frequency of Social Gatherings with Friends and Family: Not on file  . Attends Religious Services: Not on file  . Active Member of Clubs or Organizations: Not on file  . Attends Archivist Meetings: Not on file  . Marital Status: Not on file  Intimate Partner Violence:   . Fear of Current or Ex-Partner: Not  on file  . Emotionally Abused: Not on file  . Physically Abused: Not on file  . Sexually Abused: Not on file    Family History  Problem Relation Age of Onset  . Malignant hyperthermia Mother   . Thyroid disease Mother   . Hypertension Mother   . Malignant hyperthermia Father   . Hypertension Father   . Diabetes Brother   . Anesthesia problems Neg Hx     No Known Allergies   Review of Systems   Review of Systems: Negative Unless Checked Constitutional: [] Weight loss  [] Fever  [] Chills Cardiac: [] Chest pain   []  Atrial Fibrillation  [] Palpitations   [] Shortness of breath when laying flat   [] Shortness of breath with exertion. [] Shortness of breath at rest Vascular:  [] Pain in legs with walking   [] Pain in legs with standing [] Pain in legs when laying flat   [] Claudication    [] Pain in feet when laying flat    [] History of DVT   [] Phlebitis   [] Swelling in legs   [] Varicose veins   [] Non-healing  ulcers Pulmonary:   [] Uses home oxygen   [] Productive cough   [] Hemoptysis   [] Wheeze  [] COPD   [] Asthma Neurologic:  [] Dizziness   [] Seizures  [] Blackouts [] History of stroke   [] History of TIA  [] Aphasia   [] Temporary Blindness   [] Weakness or numbness in arm   [x] Weakness or numbness in leg Musculoskeletal:   [] Joint swelling   [] Joint pain   [] Low back pain  []  History of Knee Replacement [] Arthritis [] back Surgeries  []  Spinal Stenosis    Hematologic:  [] Easy bruising  [] Easy bleeding   [] Hypercoagulable state   [x] Anemic Gastrointestinal:  [] Diarrhea   [] Vomiting  [] Gastroesophageal reflux/heartburn   [] Difficulty swallowing. [] Abdominal pain Genitourinary:  [x] Chronic kidney disease   [] Difficult urination  [] Anuric   [] Blood in urine [] Frequent urination  [] Burning with urination   [] Hematuria Skin:  [] Rashes   [] Ulcers [] Wounds Psychological:  [] History of anxiety   []  History of major depression  []  Memory Difficulties      OBJECTIVE:   Physical Exam  BP (!) 175/93 (BP Location: Left Leg)   Pulse 97   Resp 16   Ht 5' (1.524 m)   Wt 170 lb (77.1 kg)   BMI 33.20 kg/m   Gen: WD/WN, NAD Head: [place]/AT, No temporalis wasting.  Ear/Nose/Throat: Hearing grossly intact, nares w/o erythema or drainage Eyes: PER, EOMI, sclera nonicteric.  Neck: Supple, no masses.  No JVD.  Pulmonary:  Good air movement, no use of accessory muscles.  Cardiac: RRR Vascular:  Good thrill and bruit Vessel Right Left  Radial Palpable Palpable   Gastrointestinal: soft, non-distended. No guarding/no peritoneal signs.  Musculoskeletal: M/S 5/5 throughout.    Right below-knee amputation Neurologic: Pain and light touch intact in extremities.  Symmetrical.  Speech is fluent. Motor exam as listed above. Psychiatric: Judgment intact, Mood & affect appropriate for pt's clinical situation. Dermatologic: No Venous rashes. No Ulcers Noted.  No changes consistent with cellulitis. Lymph : No Cervical  lymphadenopathy, no lichenification or skin changes of chronic lymphedema.       ASSESSMENT AND PLAN:  1. History of renal transplant Currently the patient has a good functioning AV fistula.  He does not currently use at this time due to kidney transplant however it is functional at this time if necessary.  2. PAD (peripheral artery disease) ([place]) Patient has a previous history of right below-knee amputation.  Noninvasive studies for her left lower extremity  were last checked and 05/24/2018.  We will have the patient return with ABIs in 6 months as well.  3. Essential hypertension Adequate control is important with kidney transplant and with PAD. Continue antihypertensive medications as already ordered, these medications have been reviewed and there are no changes at this time.    Current Outpatient Medications on File Prior to Visit  Medication Sig Dispense Refill  . albuterol (ACCUNEB) 0.63 MG/3ML nebulizer solution 1 ampule 3 (three) times daily as needed.     Marland Kitchen albuterol (PROVENTIL HFA;VENTOLIN HFA) 108 (90 BASE) MCG/ACT inhaler Inhale 1 puff into the lungs every 6 (six) hours as needed for wheezing or shortness of breath.    Marland Kitchen amLODipine (NORVASC) 10 MG tablet Take 0.5 tablets (5 mg total) by mouth 2 (two) times daily. If sitting BP >140/80 mm Hg (Patient taking differently: Take 10 mg by mouth daily. If sitting BP >140/80 mm Hg) 90 tablet 3  . furosemide (LASIX) 40 MG tablet Take 40 mg by mouth daily as needed. Starting today 04/21/19. Was on daily dose    . insulin aspart (NOVOLOG) 100 UNIT/ML injection Inject 0-16 Units into the skin. Pt on insulin pump.  Total of 16 units per day.    . labetalol (NORMODYNE) 200 MG tablet Take 1 tablet (200 mg total) by mouth 2 (two) times daily. 60 tablet 3  . mycophenolate (MYFORTIC) 180 MG EC tablet Take 540 mg by mouth 2 (two) times daily. (540mg ) in the morning and bedtime-( 3 capsules )    . pantoprazole (PROTONIX) 40 MG tablet Take 40 mg by  mouth at bedtime.     . rosuvastatin (CRESTOR) 10 MG tablet Take 1 tablet (10 mg total) by mouth daily. 90 tablet 2  . tacrolimus (PROGRAF) 1 MG capsule Take 2 mg by mouth 2 (two) times daily.     Marland Kitchen amLODipine (NORVASC) 10 MG tablet Take 10 mg by mouth daily.    Marland Kitchen labetalol (NORMODYNE) 100 MG tablet Take 100 mg by mouth 2 (two) times daily.    . methenamine (HIPREX) 1 g tablet Take 1 g by mouth 2 (two) times daily with a meal.     Current Facility-Administered Medications on File Prior to Visit  Medication Dose Route Frequency Provider Last Rate Last Admin  . 0.9 %  sodium chloride infusion   Intravenous Continuous Dew, Erskine Squibb, MD        There are no Patient Instructions on file for this visit. No follow-ups on file.   Kris Hartmann, NP  This note was completed with Sales executive.  Any errors are purely unintentional.

## 2019-06-13 DIAGNOSIS — Z794 Long term (current) use of insulin: Secondary | ICD-10-CM | POA: Diagnosis not present

## 2019-06-13 DIAGNOSIS — E1165 Type 2 diabetes mellitus with hyperglycemia: Secondary | ICD-10-CM | POA: Diagnosis not present

## 2019-07-05 ENCOUNTER — Ambulatory Visit: Payer: Medicare Other | Admitting: Cardiology

## 2019-07-05 NOTE — Progress Notes (Deleted)
Primary Physician/Referring:  Benito Mccreedy, MD  Patient ID: [name], female    DOB: 19-Jun-1970, 49 y.o.   MRN: [id number]  No chief complaint on file.  HPI:    [name]  is a 49 y.o.  with renal transplantation due to diabetic and hypertensive renal disease on 01/23/2013 at [facility] and followed by Dr. Elmarie Shiley. She has Essential HTN, orthostatic hypotension due to diabetic neuropathy, diabetic retinopathy and severe PAD with right common iliac artery stenting on 09/19/2015, again on 10/06/2015 had stenting of the right external iliac artery but eventually underwent RAKA in 2017 and walks with prosthesis. She has had left SFA angioplasty in Feb 2019 by Dr. Lucky Cowboy in [place].  She has had multiple episodes of syncope that started sometime in January 2020, last episode on 03/02/2019 when she was having tachycardia UTI. Syncope felt to be related to recurrent UTI and she has seen urology for this.   States she is doing well and has had occasional dizziness, Has not had any further. Claudication stable. No ulceration.  She now presents here for follow-up of hyperlipidemia.  In view of her severe PAD, LDL goal is less than 40 mg%. Presents to review labs and also f/u on hypertension. In view of severe orthostatic hypotension, only standing BP is being treated to goal.   Unfortunately she continues to gain weight, she did not get her labs done today.  Upon presentation her systolic blood pressure was 211 to mercury, improved to 146 mmHg.  Past Medical History:  Diagnosis Date  . Anemia   . CHF (congestive heart failure) ([place])   . Critical lower limb ischemia 10/05/2015  . GERD (gastroesophageal reflux disease)   . High cholesterol   . History of blood transfusion    related to "kidney transplant"  . Hypercholesteremia 08/14/2006   Qualifier: Diagnosis of  By: Eusebio Friendly    . Mechanical complication of other vascular device, implant, and graft 09/22/2012  .  Metabolic bone disease   . PAD (peripheral artery disease) ([place])   . Pericardial effusion   . Retinopathy   . Type II diabetes mellitus ([place])    "controlled with diet"   Past Surgical History:  Procedure Laterality Date  . ABOVE KNEE LEG AMPUTATION Right   . AMPUTATION Right 10/13/2015   Procedure: Right Transmetatarsal Amputation;  Surgeon: Newt Minion, MD;  Location: [place];  Service: Orthopedics;  Laterality: Right;  . AMPUTATION Right 11/29/2015   Procedure: RIGHT BELOW KNEE AMPUTATION;  Surgeon: Newt Minion, MD;  Location: [place];  Service: Orthopedics;  Laterality: Right;  . AMPUTATION FINGER     Rt hand middle finger  . AV FISTULA PLACEMENT  10/04/2011   Procedure: INSERTION OF ARTERIOVENOUS (AV) GORE-TEX GRAFT ARM;  Surgeon: Angelia Mould, MD;  Location: [place];  Service: Vascular;  Laterality: Left;  Insertion left upper arm Arteriovenous goretex graft  . AV FISTULA PLACEMENT  10/29/2011   Procedure: ARTERIOVENOUS (AV) FISTULA CREATION;  Surgeon: Angelia Mould, MD;  Location[facility];  Service: Vascular;  Laterality: Right;  Creation Right Arteriovenous Fistula   . [place] REMOVAL  10/04/2011   Procedure: REMOVAL OF ARTERIOVENOUS GORETEX GRAFT ([place]);  Surgeon: Elam Dutch, MD;  Location: [place];  Service: Vascular;  Laterality: Left;  . CHOLECYSTECTOMY N/A 12/08/2015   Procedure: LAPAROSCOPIC CHOLECYSTECTOMY WITH INTRAOPERATIVE CHOLANGIOGRAM;  Surgeon: Stark Klein, MD;  Location: [place];  Service: General;  Laterality: N/A;  . ESOPHAGOGASTRODUODENOSCOPY N/A  12/24/2012   Procedure: ESOPHAGOGASTRODUODENOSCOPY (EGD);  Surgeon: Milus Banister, MD;  Location: [place];  Service: Endoscopy;  Laterality: N/A;  . FINGER AMPUTATION Right 02/26/2013   "3rd finger"  . INSERTION OF DIALYSIS CATHETER  10/04/2011   Procedure: INSERTION OF DIALYSIS CATHETER;  Surgeon: Angelia Mould, MD;  Location: [place];  Service: Vascular;  Laterality: Right;  insertion of dialysis  catheter right internal jugular  . INSERTION OF DIALYSIS CATHETER  06/23/2012   Procedure: INSERTION OF DIALYSIS CATHETER;  Surgeon: Angelia Mould, MD;  Location: [place];  Service: Vascular;  Laterality: N/A;  Ultrasound guided  . KIDNEY TRANSPLANT  01/24/2013  . LOWER EXTREMITY ANGIOGRAPHY Left 08/12/2016   Procedure: Lower Extremity Angiography;  Surgeon: Algernon Huxley, MD;  Location: [place] CV LAB;  Service: Cardiovascular;  Laterality: Left;  . LOWER EXTREMITY INTERVENTION  08/12/2016   Procedure: Lower Extremity Intervention;  Surgeon: Algernon Huxley, MD;  Location: [place] CV LAB;  Service: Cardiovascular;;  . PATCH ANGIOPLASTY  06/23/2012   Procedure: PATCH ANGIOPLASTY;  Surgeon: Angelia Mould, MD;  Location: [place];  Service: Vascular;  Laterality: Right;  . PERIPHERAL VASCULAR CATHETERIZATION N/A 09/19/2015   Procedure: Lower Extremity Angiography;  Surgeon: Adrian Prows, MD;  Location: [place] [facility];  Service: Cardiovascular;  Laterality: N/A;  . PERIPHERAL VASCULAR CATHETERIZATION N/A 09/19/2015   Procedure: Abdominal Aortogram;  Surgeon: Adrian Prows, MD;  Location: [place] [facility];  Service: Cardiovascular;  Laterality: N/A;  . PERIPHERAL VASCULAR CATHETERIZATION Right 09/19/2015   Procedure: Peripheral Vascular Intervention;  Surgeon: Adrian Prows, MD;  Location: [place] [facility];  Service: Cardiovascular;  Laterality: Right;  Right Common  Iliac  . PERIPHERAL VASCULAR CATHETERIZATION N/A 10/06/2015   Procedure: Lower Extremity Angiography;  Surgeon: Adrian Prows, MD;  Location: [place] [facility];  Service: Cardiovascular;  Laterality: N/A;  . PERIPHERAL VASCULAR CATHETERIZATION  10/06/2015   Procedure: Peripheral Vascular Intervention;  Surgeon: Adrian Prows, MD;  Location: [place] [facility];  Service: Cardiovascular;;  REIA  Omnilink 6.0x29, innova 7x80  RSFA innova 5x80  . REVISON OF ARTERIOVENOUS FISTULA  06/23/2012   Procedure: REVISON OF ARTERIOVENOUS FISTULA;  Surgeon:  Angelia Mould, MD;  Location: [place];  Service: Vascular;  Laterality: Right;  Ultrasound guided  . SHUNTOGRAM N/A 04/06/2012   Procedure: [place];  Surgeon: Angelia Mould, MD;  Location: [place] [facility];  Service: Cardiovascular;  Laterality: N/A;   Social History   Socioeconomic History  . Marital status: Single    Spouse name: Not on file  . Number of children: 0  . Years of education: Not on file  . Highest education level: Not on file  Occupational History  . Not on file  Tobacco Use  . Smoking status: Never Smoker  . Smokeless tobacco: Never Used  Substance and Sexual Activity  . Alcohol use: No  . Drug use: No  . Sexual activity: Never  Other Topics Concern  . Not on file  Social History Narrative  . Not on file   Social Determinants of Health   Financial Resource Strain:   . Difficulty of Paying Living Expenses: Not on file  Food Insecurity:   . Worried About Charity fundraiser in the Last Year: Not on file  . Ran Out of Food in the Last Year: Not on file  Transportation Needs:   . Lack of Transportation (Medical): Not on file  . Lack of Transportation (Non-Medical): Not on  file  Physical Activity:   . Days of Exercise per Week: Not on file  . Minutes of Exercise per Session: Not on file  Stress:   . Feeling of Stress : Not on file  Social Connections:   . Frequency of Communication with Friends and Family: Not on file  . Frequency of Social Gatherings with Friends and Family: Not on file  . Attends Religious Services: Not on file  . Active Member of Clubs or Organizations: Not on file  . Attends Archivist Meetings: Not on file  . Marital Status: Not on file  Intimate Partner Violence:   . Fear of Current or Ex-Partner: Not on file  . Emotionally Abused: Not on file  . Physically Abused: Not on file  . Sexually Abused: Not on file   ROS  Review of Systems  Constitution: Negative for decreased appetite, malaise/fatigue, weight  gain and weight loss.  Eyes: Negative for visual disturbance.  Cardiovascular: Positive for claudication (night cramps). Negative for chest pain, dyspnea on exertion, leg swelling, orthopnea, palpitations and syncope.  Respiratory: Negative for hemoptysis and wheezing.   Endocrine: Negative for cold intolerance and heat intolerance.  Hematologic/Lymphatic: Does not bruise/bleed easily.  Skin: Negative for nail changes.  Musculoskeletal: Negative for muscle weakness and myalgias.  Gastrointestinal: Negative for abdominal pain, change in bowel habit, nausea and vomiting.  Neurological: Negative for difficulty with concentration, dizziness, focal weakness and headaches.  Psychiatric/Behavioral: Negative for altered mental status and suicidal ideas.  All other systems reviewed and are negative.  Objective   Vitals with BMI 06/02/2019 06/02/2019 04/21/2019  Height 5\' 0"  5\' 0"  5\' 0"   Weight 170 lbs 13 oz 170 lbs 155 lbs  BMI 33.36 10.1 75.10  Systolic 258 527 782  Diastolic 78 93 73  Pulse 423 97 100    There were no vitals taken for this visit. There is no height or weight on file to calculate BMI.   Physical Exam  Constitutional: She is oriented to person, place, and time. Vital signs are normal.  She is shortened stager and mildly obese in no acute distress.  HENT:  Head: Normocephalic and atraumatic.  Eyes: Conjunctivae are normal.  Neck: No JVD present. No thyromegaly present.  Cardiovascular: Normal rate, regular rhythm, S1 normal, S2 normal, normal heart sounds and intact distal pulses. Exam reveals no gallop.    Midsystolic murmur is present. Aortic Area Pulses:      Carotid pulses are on the right side with bruit and on the left side with bruit.      Femoral pulses are on the right side with bruit and on the left side with bruit.      Popliteal pulses are 2+ on the left side.       Dorsalis pedis pulses are 1+ on the left side.       Posterior tibial pulses are 0 on the left  side.  Right BKA noted  Pulmonary/Chest: Effort normal and breath sounds normal. No accessory muscle usage. No respiratory distress.  Abdominal: Soft. Bowel sounds are normal.  Epigastric bruit present, No prominent abdominal aortic pulsation.  Musculoskeletal:        General: Deformity (right BKA) present. No edema. Normal range of motion.     Cervical back: Normal range of motion and neck supple.  Neurological: She is alert and oriented to person, place, and time.  Skin: Skin is warm and dry.  Lower Extremity Inspection - Left - Loss of hair and Shiny  atrophic skin, No Ulcerations. Right - Inspection Normal(Right below knee amputation, stump has healed well). Palpation - Temperature - Left - Cool. Right - Normal.  Psychiatric: She has a normal mood and affect.  Vitals reviewed.  Radiology: No results found.  Laboratory examination:   No results for input(s): NA, K, CL, CO2, GLUCOSE, BUN, CREATININE, CALCIUM, GFRNONAA, GFRAA in the last 8760 hours. CMP Latest Ref Rng & Units 06/28/2018 08/12/2016 12/11/2015  Glucose 70 - 99 mg/dL 102(H) - 148(H)  BUN 6 - 20 mg/dL 17 23(H) 11  Creatinine 0.44 - 1.00 mg/dL 1.01(H) 0.98 0.78  Sodium 135 - 145 mmol/L 139 - 137  Potassium 3.5 - 5.1 mmol/L 4.0 - 3.3(L)  Chloride 98 - 111 mmol/L 108 - 107  CO2 22 - 32 mmol/L 23 - 23  Calcium 8.9 - 10.3 mg/dL 9.0 - 8.1(L)  Total Protein 6.5 - 8.1 g/dL 6.5 - -  Total Bilirubin 0.3 - 1.2 mg/dL 0.4 - -  Alkaline Phos 38 - 126 U/L 39 - -  AST 15 - 41 U/L 19 - -  ALT 0 - 44 U/L 12 - -   CBC Latest Ref Rng & Units 06/28/2018 04/26/2016 04/12/2016  WBC 4.0 - 10.5 K/uL 3.1(L) - -  Hemoglobin 12.0 - 15.0 g/dL 11.4(L) 12.6 12.1  Hematocrit 36.0 - 46.0 % 38.5 - -  Platelets 150 - 400 K/uL 152 - -   Lipid Panel     Component Value Date/Time   CHOL 171 06/03/2019 1354   TRIG 71 06/03/2019 1354   HDL 88 06/03/2019 1354   CHOLHDL 3.5 09/03/2018 1001   CHOLHDL 3.2 03/30/2011 0158   VLDL 18 03/30/2011 0158    LDLCALC 70 06/03/2019 1354   LDLDIRECT 220 (H) 03/31/2009 1955    Ref Range & Units 06/03/2019  Lipoprotein (a) <75.0 nmol/L <8.4     HEMOGLOBIN A1C Lab Results  Component Value Date   HGBA1C 8.3 (H) 10/13/2015   MPG 192 10/13/2015   TSH No results for input(s): TSH in the last 8760 hours. Medications and allergies  No Known Allergies   Current Outpatient Medications  Medication Instructions  . albuterol (ACCUNEB) 0.63 MG/3ML nebulizer solution 1 ampule, 3 times daily PRN  . albuterol (PROVENTIL HFA;VENTOLIN HFA) 108 (90 BASE) MCG/ACT inhaler 1 puff, Inhalation, Every 6 hours PRN  . amLODipine (NORVASC) 5 mg, Oral, 2 times daily, If sitting BP >140/80 mm Hg  . amLODipine (NORVASC) 10 mg, Oral, Daily  . furosemide (LASIX) 40 mg, Oral, Daily PRN, Starting today 04/21/19. Was on daily dose  . insulin aspart (NOVOLOG) 0-16 Units, Subcutaneous, Pt on insulin pump.  Total of 16 units per day.  . labetalol (NORMODYNE) 200 mg, Oral, 2 times daily  . labetalol (NORMODYNE) 100 mg, Oral, 2 times daily  . methenamine (HIPREX) 1 g, Oral, 2 times daily with meals  . mycophenolate (MYFORTIC) 540 mg, Oral, 2 times daily, (540mg ) in the morning and bedtime-( 3 capsules )  . pantoprazole (PROTONIX) 40 mg, Oral, Daily at bedtime  . rosuvastatin (CRESTOR) 10 mg, Oral, Daily  . tacrolimus (PROGRAF) 2 mg, Oral, 2 times daily   Cardiac Studies:   Echocardiogram 03/30/2011: Reveals normal LV systolic function. Mild diastolic dysfunction. Trivial pericardial effusion.LVH.   [employer] study, Renal duplex 09/07/2013: Patent transplant vasculature, resistive indicis are increasing. The renal artery velocity at the anastomosis has increased from prior study, However this may indicate elevated velocity in the proximal iliac artery rather than presence of true  stenosis.  PV Angio 10/06/2015: S/P Right EIA 6x39 and 6x60 mm balloon expandable and self expanding stent, 90% to 0%. Right SFA 85% to 0% with  5x80 mm Self expanding stent. Two vessel r/o with AT and diffusely disease Peroneal. No runoff at the ankle. Filled by faint collaterals. Right CIA stent placed 09/19/15, 6x14 mm stent patent.  Carotid artery duplex 10/29/2016: No hemodynamically significant arterial disease in the internal carotid artery bilaterally. No significnat plaque burden noted bilaterally. Antegrade right vertebral artery flow. Antegrade left vertebral artery flow. No significant change from Carotid duplex 08/20/12.  Peripheral arteriogram 08/12/2016: Percutaneous transluminal angioplasty of the entire left SFA and proximal popliteal artery with 4 mm diameter Lutonix drug-coated angioplasty balloons   Assessment     ICD-10-CM   1. Essential hypertension  I10   2. Orthostatic hypotension  I95.1   3. Peripheral artery disease ([place])  I73.9     EKG 03/22/2019: Sinus tachycardia at rate of 102 bpm, left atrial enlargement, normal axis.  Poor R-wave progression, probably normal variant.  No evidence of ischemia.  Normal QT interval. No significant change from  EKG 11/11/18: Sinus tachycardia cardiac rate of 104 beats.  Recommendations:   [name]  is a 49 y.o. with renal transplantation due to diabetic and hypertensive renal disease on 01/23/2013 at [facility] and followed by Dr. Elmarie Shiley. She has Essential HTN, orthostatic hypotension due to diabetic neuropathy, diabetic retinopathy and severe PAD with right common iliac artery stenting on 09/19/2015, again on 10/06/2015 had stenting of the right external iliac artery but eventually underwent RAKA in 2017 and walks with prosthesis. She has had left SFA angioplasty in Feb 2019 by Dr. Lucky Cowboy in [place].  She has had multiple episodes of syncope that started sometime in January 2020, last episode on 03/02/2019 when she was having tachycardia UTI. Syncope felt to be related to recurrent UTI and orthostatic hypotension.    In view of severe orthostatic hypotension, only  standing BP is being treated to goal. She now presents here for follow-up of hyperlipidemia and hypertension.Reviewed her labs, LDL is at goal at 65 but in view of uncontrolled DM and severe PAD, LDL goal is less than 40-5 is preferred. *** I will increase Crestor to 20 mg and possibly consider adding Zetia next.  OV in 6 months.     Adrian Prows, MD, [facility] 07/05/2019, 1:00 PM [facility] [place] Pager: [phone] Office: [phone] If no answer Cell [phone]

## 2019-07-14 DIAGNOSIS — Z794 Long term (current) use of insulin: Secondary | ICD-10-CM | POA: Diagnosis not present

## 2019-07-14 DIAGNOSIS — E1165 Type 2 diabetes mellitus with hyperglycemia: Secondary | ICD-10-CM | POA: Diagnosis not present

## 2019-07-28 ENCOUNTER — Emergency Department (HOSPITAL_COMMUNITY)
Admission: EM | Admit: 2019-07-28 | Discharge: 2019-07-28 | Disposition: A | Discharge status: Home / Self Care | Payer: Medicare Other | Attending: Emergency Medicine | Admitting: Emergency Medicine

## 2019-07-28 ENCOUNTER — Emergency Department (HOSPITAL_COMMUNITY): Payer: Medicare Other

## 2019-07-28 ENCOUNTER — Other Ambulatory Visit: Payer: Self-pay

## 2019-07-28 ENCOUNTER — Encounter (HOSPITAL_COMMUNITY): Payer: Self-pay | Admitting: *Deleted

## 2019-07-28 DIAGNOSIS — I509 Heart failure, unspecified: Secondary | ICD-10-CM | POA: Insufficient documentation

## 2019-07-28 DIAGNOSIS — R0689 Other abnormalities of breathing: Secondary | ICD-10-CM | POA: Diagnosis not present

## 2019-07-28 DIAGNOSIS — N184 Chronic kidney disease, stage 4 (severe): Secondary | ICD-10-CM | POA: Diagnosis not present

## 2019-07-28 DIAGNOSIS — I130 Hypertensive heart and chronic kidney disease with heart failure and stage 1 through stage 4 chronic kidney disease, or unspecified chronic kidney disease: Secondary | ICD-10-CM | POA: Diagnosis not present

## 2019-07-28 DIAGNOSIS — R410 Disorientation, unspecified: Secondary | ICD-10-CM | POA: Diagnosis not present

## 2019-07-28 DIAGNOSIS — Z79899 Other long term (current) drug therapy: Secondary | ICD-10-CM | POA: Diagnosis not present

## 2019-07-28 DIAGNOSIS — E162 Hypoglycemia, unspecified: Secondary | ICD-10-CM | POA: Diagnosis not present

## 2019-07-28 DIAGNOSIS — E1122 Type 2 diabetes mellitus with diabetic chronic kidney disease: Secondary | ICD-10-CM | POA: Insufficient documentation

## 2019-07-28 DIAGNOSIS — Z940 Kidney transplant status: Secondary | ICD-10-CM | POA: Insufficient documentation

## 2019-07-28 DIAGNOSIS — E161 Other hypoglycemia: Secondary | ICD-10-CM | POA: Diagnosis not present

## 2019-07-28 DIAGNOSIS — Z743 Need for continuous supervision: Secondary | ICD-10-CM | POA: Diagnosis not present

## 2019-07-28 DIAGNOSIS — E11649 Type 2 diabetes mellitus with hypoglycemia without coma: Secondary | ICD-10-CM | POA: Insufficient documentation

## 2019-07-28 DIAGNOSIS — Z794 Long term (current) use of insulin: Secondary | ICD-10-CM | POA: Insufficient documentation

## 2019-07-28 DIAGNOSIS — N644 Mastodynia: Secondary | ICD-10-CM | POA: Diagnosis not present

## 2019-07-28 DIAGNOSIS — S299XXA Unspecified injury of thorax, initial encounter: Secondary | ICD-10-CM | POA: Diagnosis not present

## 2019-07-28 LAB — BASIC METABOLIC PANEL
Anion gap: 12 (ref 5–15)
BUN: 18 mg/dL (ref 6–20)
CO2: 26 mmol/L (ref 22–32)
Calcium: 9.3 mg/dL (ref 8.9–10.3)
Chloride: 101 mmol/L (ref 98–111)
Creatinine, Ser: 1.04 mg/dL — ABNORMAL HIGH (ref 0.44–1.00)
GFR calc Af Amer: >60 mL/min (ref >60)
GFR calc non Af Amer: >60 mL/min (ref >60)
Glucose, Bld: 67 mg/dL — ABNORMAL LOW (ref 70–99)
Potassium: 3.1 mmol/L — ABNORMAL LOW (ref 3.5–5.1)
Sodium: 139 mmol/L (ref 135–145)

## 2019-07-28 LAB — CBC WITH DIFFERENTIAL/PLATELET
Abs Immature Granulocytes: 0.2 10*3/uL — ABNORMAL HIGH (ref 0.00–0.07)
Basophils Absolute: 0 10*3/uL (ref 0.0–0.1)
Basophils Relative: 0 %
Eosinophils Absolute: 0 10*3/uL (ref 0.0–0.5)
Eosinophils Relative: 1 %
HCT: 40.3 % (ref 36.0–46.0)
Hemoglobin: 12.2 g/dL (ref 12.0–15.0)
Immature Granulocytes: 4 %
Lymphocytes Relative: 18 %
Lymphs Abs: 0.9 10*3/uL (ref 0.7–4.0)
MCH: 24.5 pg — ABNORMAL LOW (ref 26.0–34.0)
MCHC: 30.3 g/dL (ref 30.0–36.0)
MCV: 80.9 fL (ref 80.0–100.0)
Monocytes Absolute: 0.4 10*3/uL (ref 0.1–1.0)
Monocytes Relative: 9 %
Neutro Abs: 3.5 10*3/uL (ref 1.7–7.7)
Neutrophils Relative %: 68 %
Platelets: 201 10*3/uL (ref 150–400)
RBC: 4.98 MIL/uL (ref 3.87–5.11)
RDW: 13.7 % (ref 11.5–15.5)
WBC: 5 10*3/uL (ref 4.0–10.5)
nRBC: 0 % (ref 0.0–0.2)

## 2019-07-28 LAB — CBG MONITORING, ED
Glucose-Capillary: 102 mg/dL — ABNORMAL HIGH (ref 70–99)
Glucose-Capillary: 75 mg/dL (ref 70–99)

## 2019-07-28 NOTE — ED Notes (Signed)
Patient was verbalized discharge instructions. Pt had no further questions at this time. NAD. 

## 2019-07-28 NOTE — Discharge Instructions (Addendum)
Take Tylenol for pain.  Call your diabetic doctor tomorrow and let them know about your sugar
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
## 2019-07-29 DIAGNOSIS — I10 Essential (primary) hypertension: Secondary | ICD-10-CM | POA: Diagnosis not present

## 2019-07-29 DIAGNOSIS — E1165 Type 2 diabetes mellitus with hyperglycemia: Secondary | ICD-10-CM | POA: Diagnosis not present

## 2019-07-29 DIAGNOSIS — E7800 Pure hypercholesterolemia, unspecified: Secondary | ICD-10-CM | POA: Diagnosis not present

## 2019-07-29 DIAGNOSIS — R809 Proteinuria, unspecified: Secondary | ICD-10-CM | POA: Diagnosis not present

## 2019-08-12 DIAGNOSIS — N390 Urinary tract infection, site not specified: Secondary | ICD-10-CM | POA: Diagnosis not present

## 2019-08-16 DIAGNOSIS — E782 Mixed hyperlipidemia: Secondary | ICD-10-CM | POA: Diagnosis not present

## 2019-08-16 DIAGNOSIS — E1165 Type 2 diabetes mellitus with hyperglycemia: Secondary | ICD-10-CM | POA: Diagnosis not present

## 2019-08-16 DIAGNOSIS — I10 Essential (primary) hypertension: Secondary | ICD-10-CM | POA: Diagnosis not present

## 2019-08-16 DIAGNOSIS — Z794 Long term (current) use of insulin: Secondary | ICD-10-CM | POA: Diagnosis not present

## 2019-08-16 DIAGNOSIS — J4540 Moderate persistent asthma, uncomplicated: Secondary | ICD-10-CM | POA: Diagnosis not present

## 2019-08-17 DIAGNOSIS — I739 Peripheral vascular disease, unspecified: Secondary | ICD-10-CM | POA: Diagnosis not present

## 2019-08-17 DIAGNOSIS — M2042 Other hammer toe(s) (acquired), left foot: Secondary | ICD-10-CM | POA: Diagnosis not present

## 2019-08-17 DIAGNOSIS — E119 Type 2 diabetes mellitus without complications: Secondary | ICD-10-CM | POA: Diagnosis not present

## 2019-08-17 DIAGNOSIS — L84 Corns and callosities: Secondary | ICD-10-CM | POA: Diagnosis not present

## 2019-08-17 DIAGNOSIS — B351 Tinea unguium: Secondary | ICD-10-CM | POA: Diagnosis not present

## 2019-08-18 DIAGNOSIS — I129 Hypertensive chronic kidney disease with stage 1 through stage 4 chronic kidney disease, or unspecified chronic kidney disease: Secondary | ICD-10-CM | POA: Diagnosis not present

## 2019-08-18 DIAGNOSIS — N2581 Secondary hyperparathyroidism of renal origin: Secondary | ICD-10-CM | POA: Diagnosis not present

## 2019-08-18 DIAGNOSIS — N189 Chronic kidney disease, unspecified: Secondary | ICD-10-CM | POA: Diagnosis not present

## 2019-08-18 DIAGNOSIS — Z940 Kidney transplant status: Secondary | ICD-10-CM | POA: Diagnosis not present

## 2019-08-24 DIAGNOSIS — N2581 Secondary hyperparathyroidism of renal origin: Secondary | ICD-10-CM | POA: Diagnosis not present

## 2019-08-24 DIAGNOSIS — N182 Chronic kidney disease, stage 2 (mild): Secondary | ICD-10-CM | POA: Diagnosis not present

## 2019-08-24 DIAGNOSIS — D631 Anemia in chronic kidney disease: Secondary | ICD-10-CM | POA: Diagnosis not present

## 2019-08-24 DIAGNOSIS — Z940 Kidney transplant status: Secondary | ICD-10-CM | POA: Diagnosis not present

## 2019-08-24 DIAGNOSIS — I129 Hypertensive chronic kidney disease with stage 1 through stage 4 chronic kidney disease, or unspecified chronic kidney disease: Secondary | ICD-10-CM | POA: Diagnosis not present

## 2019-08-26 DIAGNOSIS — Z794 Long term (current) use of insulin: Secondary | ICD-10-CM | POA: Diagnosis not present

## 2019-08-26 DIAGNOSIS — E1165 Type 2 diabetes mellitus with hyperglycemia: Secondary | ICD-10-CM | POA: Diagnosis not present

## 2019-08-28 ENCOUNTER — Other Ambulatory Visit: Payer: Self-pay | Admitting: Cardiology

## 2019-09-02 ENCOUNTER — Other Ambulatory Visit: Payer: Self-pay

## 2019-09-02 ENCOUNTER — Encounter: Payer: Self-pay | Admitting: Cardiology

## 2019-09-02 ENCOUNTER — Ambulatory Visit: Payer: Medicare Other | Admitting: Cardiology

## 2019-09-02 VITALS — Temp 97.6°F | Ht 60.0 in | Wt 170.8 lb

## 2019-09-02 DIAGNOSIS — I10 Essential (primary) hypertension: Secondary | ICD-10-CM | POA: Diagnosis not present

## 2019-09-02 DIAGNOSIS — I951 Orthostatic hypotension: Secondary | ICD-10-CM | POA: Diagnosis not present

## 2019-09-02 DIAGNOSIS — I739 Peripheral vascular disease, unspecified: Secondary | ICD-10-CM

## 2019-09-02 NOTE — Patient Outreach (Signed)
[place] [facility]) Care Management  09/02/2019  [name] [birth year] [id number]   Medication Adherence call to [name] Compliant Voice message left with a call back number. Spoke with patient mother she ask to call patients cell number ,patient did not answer. [name] is showing past due on Rosuvastatin 20 mg under [place].   [place] Management Direct Dial [phone]  Fax [phone] [email or web address]

## 2019-09-02 NOTE — Progress Notes (Signed)
Primary Physician/Referring:  Benito Mccreedy, MD  Patient ID: [name], female    DOB: March 10, 1971, 49 y.o.   MRN: [id number]  Chief Complaint  Patient presents with  . Dizziness  . PAD  . Orthostatic hypotension   HPI:    [name]  is a 49 y.o.  with renal transplantation due to diabetic and hypertensive renal disease on 01/23/2013 at [facility] and followed by Dr. Elmarie Shiley. She has Essential HTN, orthostatic hypotension due to diabetic neuropathy, diabetic retinopathy and severe PAD with right common iliac artery stenting on 09/19/2015, again on 10/06/2015 had stenting of the right external iliac artery but eventually underwent RAKA in 2017 and walks with prosthesis. She has had left SFA angioplasty in Feb 2019 by Dr. Lucky Cowboy in [place].  She has had multiple episodes of syncope that started sometime in January 2020, last episode on 03/02/2019 when she was having tachycardia UTI. Syncope felt to be related to recurrent UTI and she has seen urology for this. States she is doing well and has had occasional dizziness, Has not had any further. Claudication stable. No ulceration.  She now presents here for follow-up of hyperlipidemia and supine hypertension and orthostatic hypotension.  In view of her severe PAD, LDL goal is less than 40 mg%.    Past Medical History:  Diagnosis Date  . Anemia   . CHF (congestive heart failure) ([place])   . Critical lower limb ischemia 10/05/2015  . GERD (gastroesophageal reflux disease)   . High cholesterol   . History of blood transfusion    related to "kidney transplant"  . Hypercholesteremia 08/14/2006   Qualifier: Diagnosis of  By: Eusebio Friendly    . Hypertension   . Mechanical complication of other vascular device, implant, and graft 09/22/2012  . Metabolic bone disease   . PAD (peripheral artery disease) ([place])   . Pericardial effusion   . Retinopathy   . Type II diabetes mellitus ([place])    "controlled with diet"   Past  Surgical History:  Procedure Laterality Date  . ABOVE KNEE LEG AMPUTATION Right   . AMPUTATION Right 10/13/2015   Procedure: Right Transmetatarsal Amputation;  Surgeon: Newt Minion, MD;  Location: [place];  Service: Orthopedics;  Laterality: Right;  . AMPUTATION Right 11/29/2015   Procedure: RIGHT BELOW KNEE AMPUTATION;  Surgeon: Newt Minion, MD;  Location: [place];  Service: Orthopedics;  Laterality: Right;  . AMPUTATION FINGER     Rt hand middle finger  . AV FISTULA PLACEMENT  10/04/2011   Procedure: INSERTION OF ARTERIOVENOUS (AV) GORE-TEX GRAFT ARM;  Surgeon: Angelia Mould, MD;  Location: [place];  Service: Vascular;  Laterality: Left;  Insertion left upper arm Arteriovenous goretex graft  . AV FISTULA PLACEMENT  10/29/2011   Procedure: ARTERIOVENOUS (AV) FISTULA CREATION;  Surgeon: Angelia Mould, MD;  Location[facility];  Service: Vascular;  Laterality: Right;  Creation Right Arteriovenous Fistula   . [place] REMOVAL  10/04/2011   Procedure: REMOVAL OF ARTERIOVENOUS GORETEX GRAFT ([place]);  Surgeon: Elam Dutch, MD;  Location: [place];  Service: Vascular;  Laterality: Left;  . CHOLECYSTECTOMY N/A 12/08/2015   Procedure: LAPAROSCOPIC CHOLECYSTECTOMY WITH INTRAOPERATIVE CHOLANGIOGRAM;  Surgeon: Stark Klein, MD;  Location: [place];  Service: General;  Laterality: N/A;  . ESOPHAGOGASTRODUODENOSCOPY N/A 12/24/2012   Procedure: ESOPHAGOGASTRODUODENOSCOPY (EGD);  Surgeon: Milus Banister, MD;  Location: [place];  Service: Endoscopy;  Laterality: N/A;  . FINGER AMPUTATION Right 02/26/2013   "3rd finger"  .  INSERTION OF DIALYSIS CATHETER  10/04/2011   Procedure: INSERTION OF DIALYSIS CATHETER;  Surgeon: Angelia Mould, MD;  Location: [place];  Service: Vascular;  Laterality: Right;  insertion of dialysis catheter right internal jugular  . INSERTION OF DIALYSIS CATHETER  06/23/2012   Procedure: INSERTION OF DIALYSIS CATHETER;  Surgeon: Angelia Mould, MD;  Location: [place];  Service:  Vascular;  Laterality: N/A;  Ultrasound guided  . KIDNEY TRANSPLANT  01/24/2013  . LOWER EXTREMITY ANGIOGRAPHY Left 08/12/2016   Procedure: Lower Extremity Angiography;  Surgeon: Algernon Huxley, MD;  Location: [place] [facility];  Service: Cardiovascular;  Laterality: Left;  . LOWER EXTREMITY INTERVENTION  08/12/2016   Procedure: Lower Extremity Intervention;  Surgeon: Algernon Huxley, MD;  Location: [place] [facility];  Service: Cardiovascular;;  . NEPHRECTOMY TRANSPLANTED ORGAN    . PATCH ANGIOPLASTY  06/23/2012   Procedure: PATCH ANGIOPLASTY;  Surgeon: Angelia Mould, MD;  Location: [place];  Service: Vascular;  Laterality: Right;  . PERIPHERAL VASCULAR CATHETERIZATION N/A 09/19/2015   Procedure: Lower Extremity Angiography;  Surgeon: Adrian Prows, MD;  Location: [place] [facility];  Service: Cardiovascular;  Laterality: N/A;  . PERIPHERAL VASCULAR CATHETERIZATION N/A 09/19/2015   Procedure: Abdominal Aortogram;  Surgeon: Adrian Prows, MD;  Location: [place] [facility];  Service: Cardiovascular;  Laterality: N/A;  . PERIPHERAL VASCULAR CATHETERIZATION Right 09/19/2015   Procedure: Peripheral Vascular Intervention;  Surgeon: Adrian Prows, MD;  Location: [place] [facility];  Service: Cardiovascular;  Laterality: Right;  Right Common  Iliac  . PERIPHERAL VASCULAR CATHETERIZATION N/A 10/06/2015   Procedure: Lower Extremity Angiography;  Surgeon: Adrian Prows, MD;  Location: [place] [facility];  Service: Cardiovascular;  Laterality: N/A;  . PERIPHERAL VASCULAR CATHETERIZATION  10/06/2015   Procedure: Peripheral Vascular Intervention;  Surgeon: Adrian Prows, MD;  Location: [place] [facility];  Service: Cardiovascular;;  REIA  Omnilink 6.0x29, innova 7x80  RSFA innova 5x80  . REVISON OF ARTERIOVENOUS FISTULA  06/23/2012   Procedure: REVISON OF ARTERIOVENOUS FISTULA;  Surgeon: Angelia Mould, MD;  Location: [place];  Service: Vascular;  Laterality: Right;  Ultrasound guided  . SHUNTOGRAM N/A 04/06/2012   Procedure:  Earney Mallet;  Surgeon: Angelia Mould, MD;  [facility];  Service: Cardiovascular;  Laterality: N/A;   Social History   Tobacco Use  . Smoking status: Never Smoker  . Smokeless tobacco: Never Used  Substance Use Topics  . Alcohol use: No    ROS  Review of Systems  Cardiovascular: Positive for claudication. Negative for chest pain, dyspnea on exertion and leg swelling.  Gastrointestinal: Negative for melena.  Neurological: Positive for dizziness.   Objective   Temperature 97.6 F (36.4 C), temperature source Temporal, height 5' (1.524 m), weight 170 lb 12.8 oz (77.5 kg), SpO2 98 %. Body mass index is 33.36 kg/m.   Vitals with BMI 09/02/2019 07/28/2019 07/28/2019  Height 5\' 0"  - -  Weight 170 lbs 13 oz - -  BMI 95.18 - -  Systolic - 841 660  Diastolic - 74 76  Pulse - 95 83    Orthostatic VS for the past 72 hrs (Last 3 readings):  Orthostatic BP Patient Position BP Location Cuff Size Orthostatic Pulse  09/02/19 1403 120/77 Standing Left Arm Large 107  09/02/19 1402 (!) 164/91 Sitting Left Arm Large 99  09/02/19 1401 (!) 206/93 Supine Left Arm Large 97      Physical Exam  Constitutional: Vital signs are normal.  She is  shortened stager and mildly obese in no acute distress.  Eyes: Conjunctivae are normal.  Neck: No JVD present.  Cardiovascular: Normal rate, regular rhythm, S1 normal, S2 normal, normal heart sounds and intact distal pulses. Exam reveals no gallop.    Midsystolic murmur is present. Aortic Area Pulses:      Carotid pulses are on the right side with bruit and on the left side with bruit.      Femoral pulses are on the right side with bruit and on the left side with bruit.      Popliteal pulses are 2+ on the left side.       Dorsalis pedis pulses are 1+ on the left side.       Posterior tibial pulses are 0 on the left side.  Right BKA noted  Pulmonary/Chest: Effort normal and breath sounds normal. No accessory muscle usage. No respiratory  distress.  Abdominal: Soft. Bowel sounds are normal.  Epigastric bruit present, No prominent abdominal aortic pulsation.  Musculoskeletal:        General: Deformity (right BKA) present.  Skin:  Lower Extremity Inspection - Left - Loss of hair and Shiny atrophic skin, No Ulcerations. Right - Inspection Normal(Right below knee amputation, stump has healed well). Palpation - Temperature - Left - Cool. Right - Normal.  Vitals reviewed.  Radiology: No results found.  Laboratory examination:   Recent Labs    07/28/19 1823  NA 139  K 3.1*  CL 101  CO2 26  GLUCOSE 67*  BUN 18  CREATININE 1.04*  CALCIUM 9.3  GFRNONAA >60  GFRAA >60   CMP Latest Ref Rng & Units 07/28/2019 06/28/2018 08/12/2016  Glucose 70 - 99 mg/dL 67(L) 102(H) -  BUN 6 - 20 mg/dL 18 17 23(H)  Creatinine 0.44 - 1.00 mg/dL 1.04(H) 1.01(H) 0.98  Sodium 135 - 145 mmol/L 139 139 -  Potassium 3.5 - 5.1 mmol/L 3.1(L) 4.0 -  Chloride 98 - 111 mmol/L 101 108 -  CO2 22 - 32 mmol/L 26 23 -  Calcium 8.9 - 10.3 mg/dL 9.3 9.0 -  Total Protein 6.5 - 8.1 g/dL - 6.5 -  Total Bilirubin 0.3 - 1.2 mg/dL - 0.4 -  Alkaline Phos 38 - 126 U/L - 39 -  AST 15 - 41 U/L - 19 -  ALT 0 - 44 U/L - 12 -   CBC Latest Ref Rng & Units 07/28/2019 06/28/2018 04/26/2016  WBC 4.0 - 10.5 K/uL 5.0 3.1(L) -  Hemoglobin 12.0 - 15.0 g/dL 12.2 11.4(L) 12.6  Hematocrit 36.0 - 46.0 % 40.3 38.5 -  Platelets 150 - 400 K/uL 201 152 -   Lipid Panel     Component Value Date/Time   CHOL 171 06/03/2019 1354   TRIG 71 06/03/2019 1354   HDL 88 06/03/2019 1354   CHOLHDL 3.5 09/03/2018 1001   CHOLHDL 3.2 03/30/2011 0158   VLDL 18 03/30/2011 0158   LDLCALC 70 06/03/2019 1354   LDLDIRECT 220 (H) 03/31/2009 1955   Lipoprotein (a) 06/03/2019 <75.0 nmol/L <8.4    HEMOGLOBIN A1C Lab Results  Component Value Date   HGBA1C 8.3 (H) 10/13/2015   MPG 192 10/13/2015   TSH No results for input(s): TSH in the last 8760 hours. Medications and allergies  No  Known Allergies   Current Outpatient Medications  Medication Instructions  . albuterol (ACCUNEB) 0.63 MG/3ML nebulizer solution 1 ampule, Nebulization, Every 6 hours PRN  . albuterol (PROVENTIL HFA;VENTOLIN HFA) 108 (90 BASE) MCG/ACT inhaler 1 puff, Inhalation,  Every 6 hours PRN  . amLODipine (NORVASC) 5 mg, Oral, 2 times daily, If sitting BP >140/80 mm Hg  . diphenoxylate-atropine (LOMOTIL) 2.5-0.025 MG tablet 1 tablet, Oral, 3 times daily PRN  . fluticasone (FLONASE) 50 MCG/ACT nasal spray 2 sprays, Each Nare, Daily PRN  . furosemide (LASIX) 40 mg, Oral, Daily  . insulin aspart (NOVOLOG) 0-16 Units, Subcutaneous, Pt on insulin pump.  Total of 16 units per day.  . labetalol (NORMODYNE) 100 mg, Oral, Daily at bedtime  . methenamine (HIPREX) 1 g, Oral, 2 times daily with meals  . mycophenolate (MYFORTIC) 540 mg, Oral, 2 times daily, (540mg ) in the morning and bedtime-( 3 capsules )  . pantoprazole (PROTONIX) 40 mg, Oral, Daily at bedtime  . rosuvastatin (CRESTOR) 10 mg, Oral, Daily  . tacrolimus (PROGRAF) 2 mg, Oral, 2 times daily   Cardiac Studies:   Echocardiogram 03/30/2011: Reveals normal LV systolic function. Mild diastolic dysfunction. Trivial pericardial effusion.LVH.   [employer] study, Renal duplex 09/07/2013: Patent transplant vasculature, resistive indicis are increasing. The renal artery velocity at the anastomosis has increased from prior study, However this may indicate elevated velocity in the proximal iliac artery rather than presence of true stenosis.  PV Angio 10/06/2015: S/P Right EIA 6x39 and 6x60 mm balloon expandable and self expanding stent, 90% to 0%. Right SFA 85% to 0% with 5x80 mm Self expanding stent. Two vessel r/o with AT and diffusely disease Peroneal. No runoff at the ankle. Filled by faint collaterals. Right CIA stent placed 09/19/15, 6x14 mm stent patent.  Carotid artery duplex 10/29/2016: No hemodynamically significant arterial disease in the internal  carotid artery bilaterally. No significnat plaque burden noted bilaterally. Antegrade right vertebral artery flow. Antegrade left vertebral artery flow. No significant change from Carotid duplex 08/20/12.  Peripheral arteriogram 08/12/2016: Percutaneous transluminal angioplasty of the entire left SFA and proximal popliteal artery with 4 mm diameter Lutonix drug-coated angioplasty balloons   EKG 09/02/2019: Normal sinus rhythm with rate of 96 bpm, borderline criteria for left atrial enlargement otherwise normal EKG. Assessment     ICD-10-CM   1. Essential hypertension  I10 EKG 12-Lead  2. Orthostatic hypotension  I95.1   3. Peripheral artery disease ([place])  I73.9     EKG 03/22/2019: Sinus tachycardia at rate of 102 bpm, left atrial enlargement, normal axis.  Poor R-wave progression, probably normal variant.  No evidence of ischemia.  Normal QT interval. No significant change from  EKG 11/11/18: Sinus tachycardia cardiac rate of 104 beats.  Recommendations:   [name]  is a 49 y.o. with renal transplantation due to diabetic and hypertensive renal disease on 01/23/2013 at [facility] and followed by Dr. Elmarie Shiley. She has Essential HTN, orthostatic hypotension due to diabetic neuropathy, diabetic retinopathy and severe PAD with right common iliac artery stenting on 09/19/2015, again on 10/06/2015 had stenting of the right external iliac artery but eventually underwent RAKA in 2017 and walks with prosthesis. She has had left SFA angioplasty in Feb 2019 by Dr. Lucky Cowboy in [place].  She has had multiple episodes of syncope that started sometime in January 2020, last episode on 03/02/2019 when she was having tachycardia UTI. Syncope felt to be related to recurrent UTI and she has seen urology for this and now on antibiotics.  However she also has orthostatic hypotension related to her diabetes.  Although her supine blood pressure is elevated, standing blood pressure is at goal (severe  orthostatic hypotension).  I have discussed with her regarding sleeping in  a reclined position going forward to avoid long-term effect of supine hypertension.  Reviewed her lipids, under excellent control.  Patient is trying her best to cope up with her health and also controlled diabetes she is seeing Dr. Debbora Presto on 09/27/2019 for diabetes management, otherwise from cardiac standpoint she is doing well.  I will see her back in 6 months.  There is no limb threatening ischemia, symptoms of claudication are remained stable. She may benefit from nutritional consult.   Adrian Prows, MD, [facility] 09/02/2019, 2:57 PM [facility] [place] Office: [phone]   CC: Hassan Buckler, MD (Endocrine); Elmarie Shiley, MD  (Nephro), Isla Pence, MD (PCP)

## 2019-09-03 ENCOUNTER — Other Ambulatory Visit: Payer: Self-pay | Admitting: Cardiology

## 2019-09-12 ENCOUNTER — Other Ambulatory Visit: Payer: Self-pay | Admitting: Cardiology

## 2019-09-26 DIAGNOSIS — Z794 Long term (current) use of insulin: Secondary | ICD-10-CM | POA: Diagnosis not present

## 2019-09-26 DIAGNOSIS — E1165 Type 2 diabetes mellitus with hyperglycemia: Secondary | ICD-10-CM | POA: Diagnosis not present

## 2019-09-27 DIAGNOSIS — M2042 Other hammer toe(s) (acquired), left foot: Secondary | ICD-10-CM | POA: Diagnosis not present

## 2019-09-27 DIAGNOSIS — E1165 Type 2 diabetes mellitus with hyperglycemia: Secondary | ICD-10-CM | POA: Diagnosis not present

## 2019-09-27 DIAGNOSIS — Z940 Kidney transplant status: Secondary | ICD-10-CM | POA: Diagnosis not present

## 2019-09-27 DIAGNOSIS — L84 Corns and callosities: Secondary | ICD-10-CM | POA: Diagnosis not present

## 2019-09-27 DIAGNOSIS — R809 Proteinuria, unspecified: Secondary | ICD-10-CM | POA: Diagnosis not present

## 2019-09-27 DIAGNOSIS — I739 Peripheral vascular disease, unspecified: Secondary | ICD-10-CM | POA: Diagnosis not present

## 2019-09-27 DIAGNOSIS — I10 Essential (primary) hypertension: Secondary | ICD-10-CM | POA: Diagnosis not present

## 2019-10-26 DIAGNOSIS — E1165 Type 2 diabetes mellitus with hyperglycemia: Secondary | ICD-10-CM | POA: Diagnosis not present

## 2019-10-26 DIAGNOSIS — Z794 Long term (current) use of insulin: Secondary | ICD-10-CM | POA: Diagnosis not present

## 2019-10-27 DIAGNOSIS — J4540 Moderate persistent asthma, uncomplicated: Secondary | ICD-10-CM | POA: Diagnosis not present

## 2019-10-27 DIAGNOSIS — Z794 Long term (current) use of insulin: Secondary | ICD-10-CM | POA: Diagnosis not present

## 2019-10-27 DIAGNOSIS — E1165 Type 2 diabetes mellitus with hyperglycemia: Secondary | ICD-10-CM | POA: Diagnosis not present

## 2019-10-27 DIAGNOSIS — I10 Essential (primary) hypertension: Secondary | ICD-10-CM | POA: Diagnosis not present

## 2019-10-27 DIAGNOSIS — E782 Mixed hyperlipidemia: Secondary | ICD-10-CM | POA: Diagnosis not present

## 2019-11-02 DIAGNOSIS — E1165 Type 2 diabetes mellitus with hyperglycemia: Secondary | ICD-10-CM | POA: Diagnosis not present

## 2019-11-02 DIAGNOSIS — E7800 Pure hypercholesterolemia, unspecified: Secondary | ICD-10-CM | POA: Diagnosis not present

## 2019-11-16 DIAGNOSIS — I10 Essential (primary) hypertension: Secondary | ICD-10-CM | POA: Diagnosis not present

## 2019-11-16 DIAGNOSIS — R809 Proteinuria, unspecified: Secondary | ICD-10-CM | POA: Diagnosis not present

## 2019-11-16 DIAGNOSIS — E1165 Type 2 diabetes mellitus with hyperglycemia: Secondary | ICD-10-CM | POA: Diagnosis not present

## 2019-11-16 DIAGNOSIS — Z940 Kidney transplant status: Secondary | ICD-10-CM | POA: Diagnosis not present

## 2019-12-02 ENCOUNTER — Ambulatory Visit (INDEPENDENT_AMBULATORY_CARE_PROVIDER_SITE_OTHER): Payer: Medicare Other | Admitting: Nurse Practitioner

## 2019-12-02 ENCOUNTER — Encounter (INDEPENDENT_AMBULATORY_CARE_PROVIDER_SITE_OTHER): Payer: Medicare Other

## 2019-12-06 DIAGNOSIS — E1165 Type 2 diabetes mellitus with hyperglycemia: Secondary | ICD-10-CM | POA: Diagnosis not present

## 2019-12-06 DIAGNOSIS — Z794 Long term (current) use of insulin: Secondary | ICD-10-CM | POA: Diagnosis not present

## 2019-12-21 ENCOUNTER — Other Ambulatory Visit (INDEPENDENT_AMBULATORY_CARE_PROVIDER_SITE_OTHER): Payer: Self-pay | Admitting: Nurse Practitioner

## 2019-12-21 DIAGNOSIS — H109 Unspecified conjunctivitis: Secondary | ICD-10-CM | POA: Diagnosis not present

## 2019-12-21 DIAGNOSIS — R110 Nausea: Secondary | ICD-10-CM | POA: Diagnosis not present

## 2019-12-21 DIAGNOSIS — N186 End stage renal disease: Secondary | ICD-10-CM

## 2019-12-21 DIAGNOSIS — E119 Type 2 diabetes mellitus without complications: Secondary | ICD-10-CM | POA: Diagnosis not present

## 2019-12-21 DIAGNOSIS — J0100 Acute maxillary sinusitis, unspecified: Secondary | ICD-10-CM | POA: Diagnosis not present

## 2019-12-21 DIAGNOSIS — E1165 Type 2 diabetes mellitus with hyperglycemia: Secondary | ICD-10-CM | POA: Diagnosis not present

## 2019-12-21 DIAGNOSIS — I739 Peripheral vascular disease, unspecified: Secondary | ICD-10-CM

## 2019-12-21 DIAGNOSIS — J4540 Moderate persistent asthma, uncomplicated: Secondary | ICD-10-CM | POA: Diagnosis not present

## 2019-12-27 ENCOUNTER — Encounter: Payer: Self-pay | Admitting: Dietician

## 2019-12-27 ENCOUNTER — Encounter: Payer: Medicare Other | Attending: Endocrinology | Admitting: Dietician

## 2019-12-27 ENCOUNTER — Other Ambulatory Visit: Payer: Self-pay

## 2019-12-27 DIAGNOSIS — E1151 Type 2 diabetes mellitus with diabetic peripheral angiopathy without gangrene: Secondary | ICD-10-CM | POA: Insufficient documentation

## 2019-12-27 DIAGNOSIS — D631 Anemia in chronic kidney disease: Secondary | ICD-10-CM | POA: Diagnosis not present

## 2019-12-27 DIAGNOSIS — IMO0002 Reserved for concepts with insufficient information to code with codable children: Secondary | ICD-10-CM

## 2019-12-27 DIAGNOSIS — Z940 Kidney transplant status: Secondary | ICD-10-CM | POA: Diagnosis not present

## 2019-12-27 DIAGNOSIS — E1165 Type 2 diabetes mellitus with hyperglycemia: Secondary | ICD-10-CM | POA: Insufficient documentation

## 2019-12-27 DIAGNOSIS — I129 Hypertensive chronic kidney disease with stage 1 through stage 4 chronic kidney disease, or unspecified chronic kidney disease: Secondary | ICD-10-CM | POA: Diagnosis not present

## 2019-12-27 DIAGNOSIS — N182 Chronic kidney disease, stage 2 (mild): Secondary | ICD-10-CM | POA: Diagnosis not present

## 2019-12-27 DIAGNOSIS — N189 Chronic kidney disease, unspecified: Secondary | ICD-10-CM | POA: Diagnosis not present

## 2019-12-27 DIAGNOSIS — N2581 Secondary hyperparathyroidism of renal origin: Secondary | ICD-10-CM | POA: Diagnosis not present

## 2019-12-27 NOTE — Progress Notes (Signed)
Diabetes Self-Management Education  Visit Type: First/Initial  Appt. Start Time: 1145 Appt. End Time: 1215  12/27/2019  [name], identified by name and date of birth, is a 49 y.o. female with a diagnosis of Diabetes: Type 2.   ASSESSMENT Patient is here today alone.  She states that her Cardiologist would like for her to lose weight.  She states that she only eats one meal  Per day and would like to eat better. Estimated carbohydrate intake of the meal is 65-150 grams carbohydrate.  She consumes increased amounts of sugar containing beverages and states that she enters the carbs for these drinks into the Omnipod.  She uses that data bank in the Omnipod to count carbohydrates but enters her blood sugar and carbs into the Omnipod after the meal at times which has resulted in a low. She used a prescription of Baqsquimi to treat a blood sugar of 67 recently.  I discussed symptoms and treatment of low blood sugar and discussed that she should share this information with her family and coworkers and that [employer] should be reserved for low blood sugar emergencies when she is unable to eat or drink.  History includes Type 2 Diabetes, GERD, HTN, HLD, BKA 2017, history of a kidney transplant 01/2013.  Medications include Novolog via Omnipod insulin pump.  She has used this for the past 2 months. Free Style Libre blood sugar monitoring.  Last A1C per patient 7.3% 11/02/2019 increased from 6.6% 12/24/2018.  Patient lives with her parents.  Her mother does the cooking and shopping.  She work in Therapist, art for [employer]. Difficult to exercise due to BKA. No sweets, no added salt (uses salt sense or Mrs. Dash).  Increased sugar sweetened beverages and eating out most days. Discussed beverage intake, consistent meals, and counting carbohydrate by portion size as well as label reading and use of apps.  Height 5' (1.524 m), weight 173 lb (78.5 kg). Body mass index is 33.79 kg/m.    Diabetes Self-Management Education - 12/27/19 1056      Visit Information   Visit Type First/Initial      Initial Visit   Diabetes Type Type 2    Are you currently following a meal plan? Yes    What type of meal plan do you follow? carb counting    Are you taking your medications as prescribed? Yes    Date Diagnosed 2000      Health Coping   How would you rate your overall health? Good      Psychosocial Assessment   Patient Belief/Attitude about Diabetes Motivated to manage diabetes    Self-care barriers None    Self-management support Doctor's office;Family    Other persons present Patient    Patient Concerns Nutrition/Meal planning;Weight Control;Glycemic Control;Other (comment)   carbohydrate counting   Special Needs None    Preferred Learning Style No preference indicated    Learning Readiness Ready    How often do you need to have someone help you when you read instructions, pamphlets, or other written materials from your doctor or pharmacy? 1 - Never    What is the last grade level you completed in school? Bachelor's      Pre-Education Assessment   Patient understands the diabetes disease and treatment process. Needs Instruction    Patient understands incorporating nutritional management into lifestyle. Needs Instruction    Patient undertands incorporating physical activity into lifestyle. Needs Instruction    Patient understands using medications safely. Needs Instruction  Patient understands monitoring blood glucose, interpreting and using results Needs Instruction    Patient understands prevention, detection, and treatment of acute complications. Needs Instruction    Patient understands prevention, detection, and treatment of chronic complications. Needs Instruction    Patient understands how to develop strategies to address psychosocial issues. Needs Instruction    Patient understands how to develop strategies to promote health/change behavior. Needs Instruction       Complications   Last HgB A1C per patient/outside source 7.3 %   11/02/2019   How often do you check your blood sugar? > 4 times/day    Fasting Blood glucose range (mg/dL) 130-179;180-200    Postprandial Blood glucose range (mg/dL) 130-179    Number of hypoglycemic episodes per month 1    Can you tell when your blood sugar is low? Yes    What do you do if your blood sugar is low? used Baqsimi    Number of hyperglycemic episodes per week 7    Can you tell when your blood sugar is high? No    Have you had a dilated eye exam in the past 12 months? Yes    Have you had a dental exam in the past 12 months? Yes    Are you checking your feet? No      Dietary Intake   Breakfast none OR Medical illustrator    Lunch kid's meal from McDonald's or Brendolyn Patty, Wendy's or Sub (often burger and a drink)   1   Dinner skips 3-4 days per week OR homemade vegetable soup OR hotdogs or hamburgers or meatloaf or chicken or pork chop (baked) with vegetables, starch   5-6   Beverage(s) fruit punch, regular coolaide, crystal lite, occasional regular gingerale for nausea      Exercise   Exercise Type ADL's      Patient Education   Previous Diabetes Education Yes (please comment)   when diagnosed about 2000   Nutrition management  Carbohydrate counting;Food label reading, portion sizes and measuring food.;Meal options for control of blood glucose level and chronic complications.;Information on hints to eating out and maintain blood glucose control.    Physical activity and exercise  Role of exercise on diabetes management, blood pressure control and cardiac health.;Identified with patient nutritional and/or medication changes necessary with exercise.;Helped patient identify appropriate exercises in relation to his/her diabetes, diabetes complications and other health issue.    Medications Reviewed patients medication for diabetes, action, purpose, timing of dose and side effects.    Monitoring Daily foot  exams    Acute complications Taught treatment of hypoglycemia - the 15 rule.      Individualized Goals (developed by patient)   Nutrition Follow meal plan discussed;Other (comment)   carbohydrate counting   Physical Activity Exercise 3-5 times per week;15 minutes per day    Medications take my medication as prescribed    Monitoring  test my blood glucose as discussed    Reducing Risk examine blood glucose patterns;do foot checks daily;Other (comment)   consistent meal schedule   Health Coping discuss diabetes with (comment)   MD, RD, CDCES     Post-Education Assessment   Patient understands the diabetes disease and treatment process. Demonstrates understanding / competency    Patient understands incorporating nutritional management into lifestyle. Needs Review    Patient undertands incorporating physical activity into lifestyle. Needs Review    Patient understands using medications safely. Demonstrates understanding / competency    Patient understands monitoring blood glucose, interpreting  and using results Demonstrates understanding / competency    Patient understands prevention, detection, and treatment of acute complications. Demonstrates understanding / competency    Patient understands prevention, detection, and treatment of chronic complications. Demonstrates understanding / competency    Patient understands how to develop strategies to address psychosocial issues. Needs Review    Patient understands how to develop strategies to promote health/change behavior. Needs Review      Outcomes   Expected Outcomes Demonstrated interest in learning. Expect positive outcomes    Future DMSE 4-6 wks    Program Status Completed           Individualized Plan for Diabetes Self-Management Training:   Learning Objective:  Patient will have a greater understanding of diabetes self-management. Patient education plan is to attend individual and/or group sessions per assessed needs and concerns.    Plan:   Patient Instructions  Enter your carbs and blood glucose into your Omnipod prior to your meal. Rethink what you drink.  Consider options without. Continue to read labels, use your [employer] or the [employer] app to help with carbohydrates. Aim for breakfast, lunch, and dinner daily. Find ways to increase your vegetable and fruit intake. Enter your blood sugar and carbs into your pump prior to the meal. Aim to be active 30 minutes most days. Consider You tube Armchair exercises. Google "Sit and Be Fit"  Aim for 45 grams of carbohydrates at each meal. Aim for 0-15 grams of carbohydrate for a snack. Choose a small amount of protein with each meal and snack.   Expected Outcomes:  Demonstrated interest in learning. Expect positive outcomes  Education material provided: ADA - How to Thrive: A Guide for Your Journey with Diabetes, Food label handouts and Meal plan card  If problems or questions, patient to contact team via:  Phone and Email  Future DSME appointment: 4-6 wks

## 2019-12-27 NOTE — Patient Instructions (Addendum)
Enter your carbs and blood glucose into your Omnipod prior to your meal. Rethink what you drink.  Consider options without. Continue to read labels, use your [employer] or the [employer] app to help with carbohydrates. Aim for breakfast, lunch, and dinner daily. Find ways to increase your vegetable and fruit intake. Enter your blood sugar and carbs into your pump prior to the meal. Aim to be active 30 minutes most days. Consider You tube Armchair exercises. Google "Sit and Be Fit"  Aim for 45 grams of carbohydrates at each meal. Aim for 0-15 grams of carbohydrate for a snack. Choose a small amount of protein with each meal and snack.

## 2019-12-29 ENCOUNTER — Encounter (INDEPENDENT_AMBULATORY_CARE_PROVIDER_SITE_OTHER): Payer: Self-pay | Admitting: Nurse Practitioner

## 2019-12-29 ENCOUNTER — Ambulatory Visit (INDEPENDENT_AMBULATORY_CARE_PROVIDER_SITE_OTHER): Payer: Medicare Other

## 2019-12-29 ENCOUNTER — Other Ambulatory Visit: Payer: Self-pay

## 2019-12-29 ENCOUNTER — Ambulatory Visit (INDEPENDENT_AMBULATORY_CARE_PROVIDER_SITE_OTHER): Payer: Medicare Other | Admitting: Nurse Practitioner

## 2019-12-29 VITALS — BP 157/82 | HR 88 | Resp 16 | Wt 175.6 lb

## 2019-12-29 DIAGNOSIS — I10 Essential (primary) hypertension: Secondary | ICD-10-CM | POA: Diagnosis not present

## 2019-12-29 DIAGNOSIS — Z940 Kidney transplant status: Secondary | ICD-10-CM | POA: Diagnosis not present

## 2019-12-29 DIAGNOSIS — E1142 Type 2 diabetes mellitus with diabetic polyneuropathy: Secondary | ICD-10-CM

## 2019-12-29 DIAGNOSIS — N186 End stage renal disease: Secondary | ICD-10-CM

## 2019-12-29 DIAGNOSIS — I739 Peripheral vascular disease, unspecified: Secondary | ICD-10-CM | POA: Diagnosis not present

## 2019-12-29 DIAGNOSIS — Z992 Dependence on renal dialysis: Secondary | ICD-10-CM | POA: Diagnosis not present

## 2019-12-29 NOTE — Progress Notes (Signed)
Subjective:    Patient ID: [name], female    DOB: April 11, 1971, 49 y.o.   MRN: [id number] Chief Complaint  Patient presents with  . Follow-up    ultrasound follow up    The patient presents today for noninvasive studies related to her AV fistula as well as her PAD.  The patient recently received a kidney transplant in 2014.  She has not utilized her AV fistula since that time.  The patient's fistula was originally a brachiocephalic AV fistula that had to undergo a drill bypass due to steal syndrome.  The patient denies any issues with the thrill or bruit.  She denies any pain in her hand.  She denies any worsening or issues with her current kidney function.  The patient also follows for her PAD.  The patient does have a history of a right below-knee amputation.  Had been sometime since we checked the patient's perfusion.  She denies any claudication symptoms.  She denies any rest pain like symptoms.  She denies any ischemic symptoms such as color or temperature changes of her feet.  Overall she states that her walking is doing well.  Today noninvasive studies show the patient has a flow volume of 2327.  The AV fistula anastomosis does show to be stenotic compared to previous studies.  However the fistula is patent throughout.  Today noninvasive studies show an ABI of 1.10 with a TBI 0.25.  The patient has one-vessel runoff with monophasic waveforms in the anterior tibial artery with dampened toe waveforms.   Review of Systems  All other systems reviewed and are negative.      Objective:   Physical Exam Vitals reviewed.  HENT:     Head: Normocephalic.  Cardiovascular:     Rate and Rhythm: Normal rate.     Pulses: Decreased pulses.          Dorsalis pedis pulses are 1+ on the left side.  Musculoskeletal:     Right Lower Extremity: Right leg is amputated below knee.  Neurological:     Mental Status: She is alert and oriented to person, place, and time.  Psychiatric:         Mood and Affect: Mood normal.        Behavior: Behavior normal.        Thought Content: Thought content normal.        Judgment: Judgment normal.     BP (!) 157/82 (BP Location: Left Arm)   Pulse 88   Resp 16   Wt 175 lb 9.6 oz (79.7 kg)   BMI 34.29 kg/m   Past Medical History:  Diagnosis Date  . Anemia   . CHF (congestive heart failure) ([place])   . Critical lower limb ischemia 10/05/2015  . GERD (gastroesophageal reflux disease)   . High cholesterol   . History of blood transfusion    related to "kidney transplant"  . Hypercholesteremia 08/14/2006   Qualifier: Diagnosis of  By: Eusebio Friendly    . Hypertension   . Mechanical complication of other vascular device, implant, and graft 09/22/2012  . Metabolic bone disease   . PAD (peripheral artery disease) ([place])   . Pericardial effusion   . Retinopathy   . Type II diabetes mellitus ([place])    "controlled with diet"    Social History   Socioeconomic History  . Marital status: Single    Spouse name: Not on file  . Number of children: 0  . Years of education: Not on  file  . Highest education level: Not on file  Occupational History  . Not on file  Tobacco Use  . Smoking status: Never Smoker  . Smokeless tobacco: Never Used  Vaping Use  . Vaping Use: Never used  Substance and Sexual Activity  . Alcohol use: No  . Drug use: No  . Sexual activity: Never  Other Topics Concern  . Not on file  Social History Narrative  . Not on file   Social Determinants of Health   Financial Resource Strain:   . Difficulty of Paying Living Expenses:   Food Insecurity:   . Worried About Charity fundraiser in the Last Year:   . Arboriculturist in the Last Year:   Transportation Needs:   . Film/video editor (Medical):   Marland Kitchen Lack of Transportation (Non-Medical):   Physical Activity:   . Days of Exercise per Week:   . Minutes of Exercise per Session:   Stress:   . Feeling of Stress :   Social Connections:   . Frequency of  Communication with Friends and Family:   . Frequency of Social Gatherings with Friends and Family:   . Attends Religious Services:   . Active Member of Clubs or Organizations:   . Attends Archivist Meetings:   Marland Kitchen Marital Status:   Intimate Partner Violence:   . Fear of Current or Ex-Partner:   . Emotionally Abused:   Marland Kitchen Physically Abused:   . Sexually Abused:     Past Surgical History:  Procedure Laterality Date  . ABOVE KNEE LEG AMPUTATION Right   . AMPUTATION Right 10/13/2015   Procedure: Right Transmetatarsal Amputation;  Surgeon: Newt Minion, MD;  Location: [place];  Service: Orthopedics;  Laterality: Right;  . AMPUTATION Right 11/29/2015   Procedure: RIGHT BELOW KNEE AMPUTATION;  Surgeon: Newt Minion, MD;  Location: [place];  Service: Orthopedics;  Laterality: Right;  . AMPUTATION FINGER     Rt hand middle finger  . AV FISTULA PLACEMENT  10/04/2011   Procedure: INSERTION OF ARTERIOVENOUS (AV) GORE-TEX GRAFT ARM;  Surgeon: Angelia Mould, MD;  Location: [place];  Service: Vascular;  Laterality: Left;  Insertion left upper arm Arteriovenous goretex graft  . AV FISTULA PLACEMENT  10/29/2011   Procedure: ARTERIOVENOUS (AV) FISTULA CREATION;  Surgeon: Angelia Mould, MD;  Location: [facility];  Service: Vascular;  Laterality: Right;  Creation Right Arteriovenous Fistula   . [place] REMOVAL  10/04/2011   Procedure: REMOVAL OF ARTERIOVENOUS GORETEX GRAFT ([place]);  Surgeon: Elam Dutch, MD;  Location: [place];  Service: Vascular;  Laterality: Left;  . CHOLECYSTECTOMY N/A 12/08/2015   Procedure: LAPAROSCOPIC CHOLECYSTECTOMY WITH INTRAOPERATIVE CHOLANGIOGRAM;  Surgeon: Stark Klein, MD;  Location: [place];  Service: General;  Laterality: N/A;  . ESOPHAGOGASTRODUODENOSCOPY N/A 12/24/2012   Procedure: ESOPHAGOGASTRODUODENOSCOPY (EGD);  Surgeon: Milus Banister, MD;  Location: [place];  Service: Endoscopy;  Laterality: N/A;  . FINGER AMPUTATION Right 02/26/2013   "3rd finger"  .  INSERTION OF DIALYSIS CATHETER  10/04/2011   Procedure: INSERTION OF DIALYSIS CATHETER;  Surgeon: Angelia Mould, MD;  Location: [place];  Service: Vascular;  Laterality: Right;  insertion of dialysis catheter right internal jugular  . INSERTION OF DIALYSIS CATHETER  06/23/2012   Procedure: INSERTION OF DIALYSIS CATHETER;  Surgeon: Angelia Mould, MD;  Location: [place];  Service: Vascular;  Laterality: N/A;  Ultrasound guided  . KIDNEY TRANSPLANT  01/24/2013  . LOWER EXTREMITY ANGIOGRAPHY Left  08/12/2016   Procedure: Lower Extremity Angiography;  Surgeon: Algernon Huxley, MD;  Location: [place] [facility];  Service: Cardiovascular;  Laterality: Left;  . LOWER EXTREMITY INTERVENTION  08/12/2016   Procedure: Lower Extremity Intervention;  Surgeon: Algernon Huxley, MD;  Location: [place] [facility];  Service: Cardiovascular;;  . NEPHRECTOMY TRANSPLANTED ORGAN    . PATCH ANGIOPLASTY  06/23/2012   Procedure: PATCH ANGIOPLASTY;  Surgeon: Angelia Mould, MD;  Location: [place];  Service: Vascular;  Laterality: Right;  . PERIPHERAL VASCULAR CATHETERIZATION N/A 09/19/2015   Procedure: Lower Extremity Angiography;  Surgeon: Adrian Prows, MD;  Location: [place] [facility];  Service: Cardiovascular;  Laterality: N/A;  . PERIPHERAL VASCULAR CATHETERIZATION N/A 09/19/2015   Procedure: Abdominal Aortogram;  Surgeon: Adrian Prows, MD;  Location: [place] [facility];  Service: Cardiovascular;  Laterality: N/A;  . PERIPHERAL VASCULAR CATHETERIZATION Right 09/19/2015   Procedure: Peripheral Vascular Intervention;  Surgeon: Adrian Prows, MD;  Location: [place] [facility];  Service: Cardiovascular;  Laterality: Right;  Right Common  Iliac  . PERIPHERAL VASCULAR CATHETERIZATION N/A 10/06/2015   Procedure: Lower Extremity Angiography;  Surgeon: Adrian Prows, MD;  Location: [place] [facility];  Service: Cardiovascular;  Laterality: N/A;  . PERIPHERAL VASCULAR CATHETERIZATION  10/06/2015   Procedure: Peripheral Vascular Intervention;   Surgeon: Adrian Prows, MD;  Location: [place] [facility];  Service: Cardiovascular;;  REIA  Omnilink 6.0x29, innova 7x80  RSFA innova 5x80  . REVISON OF ARTERIOVENOUS FISTULA  06/23/2012   Procedure: REVISON OF ARTERIOVENOUS FISTULA;  Surgeon: Angelia Mould, MD;  Location: [place];  Service: Vascular;  Laterality: Right;  Ultrasound guided  . SHUNTOGRAM N/A 04/06/2012   Procedure: Earney Mallet;  Surgeon: Angelia Mould, MD;  Location: [facility] CATH LAB;  Service: Cardiovascular;  Laterality: N/A;    Family History  Problem Relation Age of Onset  . Malignant hyperthermia Mother   . Thyroid disease Mother   . Hypertension Mother   . Malignant hyperthermia Father   . Hypertension Father   . Diabetes Brother   . Anesthesia problems Neg Hx     No Known Allergies     Assessment & Plan:   1. PAD (peripheral artery disease) (HCC) Today's noninvasive studies show that the patient does have evidence of peripheral arterial disease with one-vessel runoff.  This is a delicate situation as the patient does have a transplanted kidney which would be sensitive to any contrast dye.  At this time the patient does not have any ischemic signs and symptoms.  We will maintain close follow-up with noninvasive studies in 6 months.  However, if the patient begins to show any remote signs of ischemia, we will plan intervention as well as work with the patient's nephrologist.  2. Essential hypertension Continue antihypertensive medications as already ordered, these medications have been reviewed and there are no changes at this time.   3. History of renal transplant The patient has had her kidney since 2014 and her fistula has not been accessed since then.  Although there is evidence of a significant stenosis at the anastomosis site, the flow is still very strong.  We will continue to monitor her AV fistula, however we will not plan for intervention at this time due to possible risk to the patient's kidney  transplant from the contrast dye.  We will however maintain follow-up.  The patient will follow up in 1 year or sooner if she is unable to palpate a thrill.  4. Type 2  diabetes mellitus with peripheral neuropathy (HCC) Continue hypoglycemic medications as already ordered, these medications have been reviewed and there are no changes at this time.  Hgb A1C to be monitored as already arranged by primary service    Current Outpatient Medications on File Prior to Visit  Medication Sig Dispense Refill  . albuterol (ACCUNEB) 0.63 MG/3ML nebulizer solution Take 1 ampule by nebulization every 6 (six) hours as needed for wheezing or shortness of breath.     Marland Kitchen albuterol (PROVENTIL HFA;VENTOLIN HFA) 108 (90 BASE) MCG/ACT inhaler Inhale 1 puff into the lungs every 6 (six) hours as needed for wheezing or shortness of breath.    Marland Kitchen amLODipine (NORVASC) 10 MG tablet Take 0.5 tablets (5 mg total) by mouth 2 (two) times daily. If sitting BP >140/80 mm Hg 90 tablet 3  . diphenoxylate-atropine (LOMOTIL) 2.5-0.025 MG tablet Take 1 tablet by mouth 3 (three) times daily as needed for diarrhea or loose stools.     . fluticasone (FLONASE) 50 MCG/ACT nasal spray Place 2 sprays into both nostrils daily as needed for allergies.     . furosemide (LASIX) 40 MG tablet Take 40 mg by mouth daily.     . insulin aspart (NOVOLOG) 100 UNIT/ML injection Inject 0-16 Units into the skin. Pt on insulin pump.  Total of 16 units per day.    . labetalol (NORMODYNE) 100 MG tablet Take 1/2 (one-half) tablet by mouth twice daily 120 tablet 1  . methenamine (HIPREX) 1 g tablet Take 1 g by mouth 2 (two) times daily with a meal.    . mycophenolate (MYFORTIC) 180 MG EC tablet Take 540 mg by mouth 2 (two) times daily. (540mg ) in the morning and bedtime-( 3 capsules )    . pantoprazole (PROTONIX) 40 MG tablet Take 40 mg by mouth at bedtime.     . rosuvastatin (CRESTOR) 10 MG tablet Take 1 tablet by mouth once daily 90 tablet 1  . tacrolimus  (PROGRAF) 1 MG capsule Take 2 mg by mouth 2 (two) times daily.      Current Facility-Administered Medications on File Prior to Visit  Medication Dose Route Frequency Provider Last Rate Last Admin  . 0.9 %  sodium chloride infusion   Intravenous Continuous Dew, Erskine Squibb, MD        There are no Patient Instructions on file for this visit. No follow-ups on file.   Kris Hartmann, NP

## 2020-01-05 DIAGNOSIS — E1165 Type 2 diabetes mellitus with hyperglycemia: Secondary | ICD-10-CM | POA: Diagnosis not present

## 2020-01-05 DIAGNOSIS — Z794 Long term (current) use of insulin: Secondary | ICD-10-CM | POA: Diagnosis not present

## 2020-01-05 DIAGNOSIS — E782 Mixed hyperlipidemia: Secondary | ICD-10-CM | POA: Diagnosis not present

## 2020-01-05 DIAGNOSIS — Z0000 Encounter for general adult medical examination without abnormal findings: Secondary | ICD-10-CM | POA: Diagnosis not present

## 2020-01-05 DIAGNOSIS — J4540 Moderate persistent asthma, uncomplicated: Secondary | ICD-10-CM | POA: Diagnosis not present

## 2020-01-18 DIAGNOSIS — E119 Type 2 diabetes mellitus without complications: Secondary | ICD-10-CM | POA: Diagnosis not present

## 2020-01-20 DIAGNOSIS — J328 Other chronic sinusitis: Secondary | ICD-10-CM | POA: Diagnosis not present

## 2020-01-24 ENCOUNTER — Ambulatory Visit: Payer: Medicaid Other | Admitting: Dietician

## 2020-02-02 DIAGNOSIS — I10 Essential (primary) hypertension: Secondary | ICD-10-CM | POA: Diagnosis not present

## 2020-02-02 DIAGNOSIS — Z794 Long term (current) use of insulin: Secondary | ICD-10-CM | POA: Diagnosis not present

## 2020-02-02 DIAGNOSIS — D72819 Decreased white blood cell count, unspecified: Secondary | ICD-10-CM | POA: Diagnosis not present

## 2020-02-02 DIAGNOSIS — Z89511 Acquired absence of right leg below knee: Secondary | ICD-10-CM | POA: Diagnosis not present

## 2020-02-02 DIAGNOSIS — B259 Cytomegaloviral disease, unspecified: Secondary | ICD-10-CM | POA: Diagnosis not present

## 2020-02-02 DIAGNOSIS — J329 Chronic sinusitis, unspecified: Secondary | ICD-10-CM | POA: Diagnosis not present

## 2020-02-02 DIAGNOSIS — D849 Immunodeficiency, unspecified: Secondary | ICD-10-CM | POA: Diagnosis not present

## 2020-02-02 DIAGNOSIS — E1165 Type 2 diabetes mellitus with hyperglycemia: Secondary | ICD-10-CM | POA: Diagnosis not present

## 2020-02-02 DIAGNOSIS — Z9641 Presence of insulin pump (external) (internal): Secondary | ICD-10-CM | POA: Diagnosis not present

## 2020-02-02 DIAGNOSIS — D649 Anemia, unspecified: Secondary | ICD-10-CM | POA: Diagnosis not present

## 2020-02-02 DIAGNOSIS — Z79899 Other long term (current) drug therapy: Secondary | ICD-10-CM | POA: Diagnosis not present

## 2020-02-02 DIAGNOSIS — Z87440 Personal history of urinary (tract) infections: Secondary | ICD-10-CM | POA: Diagnosis not present

## 2020-02-02 DIAGNOSIS — Z4822 Encounter for aftercare following kidney transplant: Secondary | ICD-10-CM | POA: Diagnosis not present

## 2020-02-02 DIAGNOSIS — Z7722 Contact with and (suspected) exposure to environmental tobacco smoke (acute) (chronic): Secondary | ICD-10-CM | POA: Diagnosis not present

## 2020-02-02 DIAGNOSIS — E785 Hyperlipidemia, unspecified: Secondary | ICD-10-CM | POA: Diagnosis not present

## 2020-02-02 DIAGNOSIS — J069 Acute upper respiratory infection, unspecified: Secondary | ICD-10-CM | POA: Diagnosis not present

## 2020-02-02 DIAGNOSIS — Z940 Kidney transplant status: Secondary | ICD-10-CM | POA: Diagnosis not present

## 2020-02-02 DIAGNOSIS — E119 Type 2 diabetes mellitus without complications: Secondary | ICD-10-CM | POA: Diagnosis not present

## 2020-02-05 DIAGNOSIS — Z794 Long term (current) use of insulin: Secondary | ICD-10-CM | POA: Diagnosis not present

## 2020-02-05 DIAGNOSIS — E1165 Type 2 diabetes mellitus with hyperglycemia: Secondary | ICD-10-CM | POA: Diagnosis not present

## 2020-03-04 ENCOUNTER — Other Ambulatory Visit: Payer: Self-pay | Admitting: Cardiology

## 2020-03-06 ENCOUNTER — Telehealth: Payer: Self-pay

## 2020-03-06 ENCOUNTER — Ambulatory Visit: Payer: Medicare Other | Admitting: Cardiology

## 2020-03-06 ENCOUNTER — Other Ambulatory Visit: Payer: Self-pay

## 2020-03-06 ENCOUNTER — Encounter: Payer: Self-pay | Admitting: Cardiology

## 2020-03-06 VITALS — BP 218/87 | HR 98 | Resp 16 | Ht 60.0 in | Wt 180.0 lb

## 2020-03-06 DIAGNOSIS — I951 Orthostatic hypotension: Secondary | ICD-10-CM | POA: Diagnosis not present

## 2020-03-06 DIAGNOSIS — I739 Peripheral vascular disease, unspecified: Secondary | ICD-10-CM | POA: Diagnosis not present

## 2020-03-06 DIAGNOSIS — I10 Essential (primary) hypertension: Secondary | ICD-10-CM

## 2020-03-06 DIAGNOSIS — E1142 Type 2 diabetes mellitus with diabetic polyneuropathy: Secondary | ICD-10-CM | POA: Diagnosis not present

## 2020-03-06 DIAGNOSIS — Z6835 Body mass index (BMI) 35.0-35.9, adult: Secondary | ICD-10-CM

## 2020-03-06 MED ORDER — LABETALOL HCL 100 MG PO TABS: 100.0000 mg | ORAL_TABLET | Freq: Two times a day (BID) | ORAL | 1 refills | Status: DC

## 2020-03-06 NOTE — Progress Notes (Signed)
Primary Physician/Referring:  Benito Mccreedy, MD  Patient ID: [name], female    DOB: Jul 03, 1970, 49 y.o.   MRN: [id number]  Chief Complaint  Patient presents with  . Hypertension  . Orthostatic Hypotension  . Follow-up    6 month   HPI:    [name]  is a 49 y.o.  with renal transplantation due to diabetic and hypertensive renal disease on 01/23/2013 at [facility] and followed by Dr. Elmarie Shiley. She has Essential HTN, orthostatic hypotension due to diabetic neuropathy, diabetic retinopathy and severe PAD with right common iliac artery stenting on 09/19/2015, again on 10/06/2015 had stenting of the right external iliac artery but eventually underwent RAKA in 2017 and walks with prosthesis. She has had left SFA angioplasty in Feb 2019 by Dr. Lucky Cowboy in [place].  She now presents here for follow-up of hyperlipidemia and supine hypertension and orthostatic hypotension.  For the last 6 to 8 months she was having frequent episodes of syncope related to severe and recurrent UTI.  She was treated by urology and over the past 3 to 4 months she has not had any recurrence of UTI.  Otherwise she is presently doing well and no symptoms of chest pain, dyspnea, no ulceration in her feet.  Past Medical History:  Diagnosis Date  . Anemia   . CHF (congestive heart failure) ([place])   . Critical lower limb ischemia 10/05/2015  . GERD (gastroesophageal reflux disease)   . High cholesterol   . History of blood transfusion    related to "kidney transplant"  . Hypercholesteremia 08/14/2006   Qualifier: Diagnosis of  By: Eusebio Friendly    . Hypertension   . Mechanical complication of other vascular device, implant, and graft 09/22/2012  . Metabolic bone disease   . PAD (peripheral artery disease) ([place])   . Pericardial effusion   . Retinopathy   . Type II diabetes mellitus ([place])    "controlled with diet"   Past Surgical History:  Procedure Laterality Date  . ABOVE KNEE LEG  AMPUTATION Right   . AMPUTATION Right 10/13/2015   Procedure: Right Transmetatarsal Amputation;  Surgeon: Newt Minion, MD;  Location: [place];  Service: Orthopedics;  Laterality: Right;  . AMPUTATION Right 11/29/2015   Procedure: RIGHT BELOW KNEE AMPUTATION;  Surgeon: Newt Minion, MD;  Location: [place];  Service: Orthopedics;  Laterality: Right;  . AMPUTATION FINGER     Rt hand middle finger  . AV FISTULA PLACEMENT  10/04/2011   Procedure: INSERTION OF ARTERIOVENOUS (AV) GORE-TEX GRAFT ARM;  Surgeon: Angelia Mould, MD;  Location: [place];  Service: Vascular;  Laterality: Left;  Insertion left upper arm Arteriovenous goretex graft  . AV FISTULA PLACEMENT  10/29/2011   Procedure: ARTERIOVENOUS (AV) FISTULA CREATION;  Surgeon: Angelia Mould, MD;  Location: [facility] OR;  Service: Vascular;  Laterality: Right;  Creation Right Arteriovenous Fistula   . [place] REMOVAL  10/04/2011   Procedure: REMOVAL OF ARTERIOVENOUS GORETEX GRAFT ([place]);  Surgeon: Elam Dutch, MD;  Location: [place];  Service: Vascular;  Laterality: Left;  . CHOLECYSTECTOMY N/A 12/08/2015   Procedure: LAPAROSCOPIC CHOLECYSTECTOMY WITH INTRAOPERATIVE CHOLANGIOGRAM;  Surgeon: Stark Klein, MD;  Location: [place];  Service: General;  Laterality: N/A;  . ESOPHAGOGASTRODUODENOSCOPY N/A 12/24/2012   Procedure: ESOPHAGOGASTRODUODENOSCOPY (EGD);  Surgeon: Milus Banister, MD;  Location: [place];  Service: Endoscopy;  Laterality: N/A;  . FINGER AMPUTATION Right 02/26/2013   "3rd finger"  . INSERTION OF DIALYSIS CATHETER  10/04/2011   Procedure: INSERTION OF DIALYSIS CATHETER;  Surgeon: Angelia Mould, MD;  Location: [place];  Service: Vascular;  Laterality: Right;  insertion of dialysis catheter right internal jugular  . INSERTION OF DIALYSIS CATHETER  06/23/2012   Procedure: INSERTION OF DIALYSIS CATHETER;  Surgeon: Angelia Mould, MD;  Location: [place];  Service: Vascular;  Laterality: N/A;  Ultrasound guided  . KIDNEY  TRANSPLANT  01/24/2013  . LOWER EXTREMITY ANGIOGRAPHY Left 08/12/2016   Procedure: Lower Extremity Angiography;  Surgeon: Algernon Huxley, MD;  Location: [place] [facility];  Service: Cardiovascular;  Laterality: Left;  . LOWER EXTREMITY INTERVENTION  08/12/2016   Procedure: Lower Extremity Intervention;  Surgeon: Algernon Huxley, MD;  Location: [place] [facility];  Service: Cardiovascular;;  . NEPHRECTOMY TRANSPLANTED ORGAN    . PATCH ANGIOPLASTY  06/23/2012   Procedure: PATCH ANGIOPLASTY;  Surgeon: Angelia Mould, MD;  Location: [place];  Service: Vascular;  Laterality: Right;  . PERIPHERAL VASCULAR CATHETERIZATION N/A 09/19/2015   Procedure: Lower Extremity Angiography;  Surgeon: Adrian Prows, MD;  Location: [place] [facility];  Service: Cardiovascular;  Laterality: N/A;  . PERIPHERAL VASCULAR CATHETERIZATION N/A 09/19/2015   Procedure: Abdominal Aortogram;  Surgeon: Adrian Prows, MD;  Location: [place] [facility];  Service: Cardiovascular;  Laterality: N/A;  . PERIPHERAL VASCULAR CATHETERIZATION Right 09/19/2015   Procedure: Peripheral Vascular Intervention;  Surgeon: Adrian Prows, MD;  Location: [place] [facility];  Service: Cardiovascular;  Laterality: Right;  Right Common  Iliac  . PERIPHERAL VASCULAR CATHETERIZATION N/A 10/06/2015   Procedure: Lower Extremity Angiography;  Surgeon: Adrian Prows, MD;  Location: [place] [facility];  Service: Cardiovascular;  Laterality: N/A;  . PERIPHERAL VASCULAR CATHETERIZATION  10/06/2015   Procedure: Peripheral Vascular Intervention;  Surgeon: Adrian Prows, MD;  Location: [place] [facility];  Service: Cardiovascular;;  REIA  Omnilink 6.0x29, innova 7x80  RSFA innova 5x80  . REVISON OF ARTERIOVENOUS FISTULA  06/23/2012   Procedure: REVISON OF ARTERIOVENOUS FISTULA;  Surgeon: Angelia Mould, MD;  Location: [place];  Service: Vascular;  Laterality: Right;  Ultrasound guided  . SHUNTOGRAM N/A 04/06/2012   Procedure: [place];  Surgeon: Angelia Mould, MD;  Location[facility]  CATH LAB;  Service: Cardiovascular;  Laterality: N/A;   Social History   Tobacco Use  . Smoking status: Never Smoker  . Smokeless tobacco: Never Used  Substance Use Topics  . Alcohol use: No    ROS  Review of Systems  Constitutional: Positive for weight gain.  Cardiovascular: Positive for claudication. Negative for chest pain, dyspnea on exertion and leg swelling.  Gastrointestinal: Negative for melena.  Neurological: Positive for dizziness.   Objective   Blood pressure (!) 218/87, pulse 98, resp. rate 16, height 5' (1.524 m), weight 180 lb (81.6 kg), SpO2 96 %. Body mass index is 35.15 kg/m.   Vitals with BMI 03/06/2020 12/29/2019 12/27/2019  Height 5\' 0"  - 5\' 0"   Weight 180 lbs 175 lbs 10 oz 173 lbs  BMI 35.15 26.37 85.88  Systolic 502 774 -  Diastolic 87 82 -  Pulse 98 88 -    Orthostatic VS for the past 72 hrs (Last 3 readings):  Orthostatic BP Patient Position BP Location Cuff Size Orthostatic Pulse  03/06/20 1327 120/60 Standing Left Arm Large 64  03/06/20 1326 115/70 Sitting Left Arm Large 72  03/06/20 1325 140/59 Supine Left Arm Large 87      Physical Exam Vitals reviewed.  Constitutional:  Comments: She is shortened stager and moderately obese in no acute distress.  Eyes:     Conjunctiva/sclera: Conjunctivae normal.  Neck:     Vascular: No JVD.  Cardiovascular:     Rate and Rhythm: Normal rate and regular rhythm.     Pulses: Intact distal pulses.          Carotid pulses are on the right side with bruit and on the left side with bruit.      Femoral pulses are on the right side with bruit and on the left side with bruit.      Popliteal pulses are 2+ on the left side.       Dorsalis pedis pulses are 1+ on the left side.       Posterior tibial pulses are 0 on the left side.     Heart sounds: Normal heart sounds, S1 normal and S2 normal.   Midsystolic murmur is present. Aortic Area   No gallop.      Comments: Right BKA noted. Right arm A-V dialysis fistula  noted. NO leg edema.  Pulmonary:     Effort: Pulmonary effort is normal. No accessory muscle usage or respiratory distress.     Breath sounds: Normal breath sounds.  Abdominal:     General: Bowel sounds are normal.     Palpations: Abdomen is soft.     Comments: Epigastric bruit present, No prominent abdominal aortic pulsation.  Musculoskeletal:        General: Deformity (right BKA) present.  Skin:    Comments: Lower Extremity Inspection - Left - Loss of hair and Shiny atrophic skin, No Ulcerations. Right - Inspection Normal(Right below knee amputation, stump has healed well). Palpation - Temperature - Left - Cool. Right - Normal.    Radiology: No results found.  Laboratory examination:   Recent Labs    07/28/19 1823  NA 139  K 3.1*  CL 101  CO2 26  GLUCOSE 67*  BUN 18  CREATININE 1.04*  CALCIUM 9.3  GFRNONAA >60  GFRAA >60   CMP Latest Ref Rng & Units 07/28/2019 06/28/2018 08/12/2016  Glucose 70 - 99 mg/dL 67(L) 102(H) -  BUN 6 - 20 mg/dL 18 17 23(H)  Creatinine 0.44 - 1.00 mg/dL 1.04(H) 1.01(H) 0.98  Sodium 135 - 145 mmol/L 139 139 -  Potassium 3.5 - 5.1 mmol/L 3.1(L) 4.0 -  Chloride 98 - 111 mmol/L 101 108 -  CO2 22 - 32 mmol/L 26 23 -  Calcium 8.9 - 10.3 mg/dL 9.3 9.0 -  Total Protein 6.5 - 8.1 g/dL - 6.5 -  Total Bilirubin 0.3 - 1.2 mg/dL - 0.4 -  Alkaline Phos 38 - 126 U/L - 39 -  AST 15 - 41 U/L - 19 -  ALT 0 - 44 U/L - 12 -   CBC Latest Ref Rng & Units 07/28/2019 06/28/2018 04/26/2016  WBC 4.0 - 10.5 K/uL 5.0 3.1(L) -  Hemoglobin 12.0 - 15.0 g/dL 12.2 11.4(L) 12.6  Hematocrit 36 - 46 % 40.3 38.5 -  Platelets 150 - 400 K/uL 201 152 -   Lipid Panel Recent Labs    06/03/19 1354  CHOL 171  TRIG 71  LDLCALC 70  HDL 88     Lipoprotein (a) 06/03/2019 <75.0 nmol/L <8.4    HEMOGLOBIN A1C Lab Results  Component Value Date   HGBA1C 8.3 (H) 10/13/2015   MPG 192 10/13/2015   TSH No results for input(s): TSH in the last 8760 hours.   External  labs:  Cholesterol, total 171.000 M 01/05/2020 HDL 68.000 MG 01/05/2020 LDL-C 67.000 11/02/2019 Triglycerides 125.000 M 01/05/2020  A1C 7.700 % 01/05/2020 TSH 3.660 01/05/2020   Hemoglobin 12.400 G/ 12/27/2019 INR 1.000 10/02/2015 Platelets 202.000 X1 12/27/2019  Creatinine, Serum 1.070 MG/ 12/27/2019 Potassium 3.100 07/28/2019 Magnesium 2.100 MG/ 05/06/2016 ALT (SGPT) 23.000 IU/ 12/27/2019   Medications and allergies  No Known Allergies   Current Outpatient Medications on File Prior to Visit  Medication Sig Dispense Refill  . albuterol (ACCUNEB) 0.63 MG/3ML nebulizer solution Take 1 ampule by nebulization every 6 (six) hours as needed for wheezing or shortness of breath.     Marland Kitchen albuterol (PROVENTIL HFA;VENTOLIN HFA) 108 (90 BASE) MCG/ACT inhaler Inhale 1 puff into the lungs every 6 (six) hours as needed for wheezing or shortness of breath.    Marland Kitchen amLODipine (NORVASC) 10 MG tablet Take 0.5 tablets (5 mg total) by mouth 2 (two) times daily. If sitting BP >140/80 mm Hg (Patient taking differently: Take 10 mg by mouth daily. If sitting BP >140/80 mm Hg) 90 tablet 3  . diphenoxylate-atropine (LOMOTIL) 2.5-0.025 MG tablet Take 1 tablet by mouth 3 (three) times daily as needed for diarrhea or loose stools.     . fluticasone (FLONASE) 50 MCG/ACT nasal spray Place 2 sprays into both nostrils daily as needed for allergies.     . furosemide (LASIX) 40 MG tablet Take 40 mg by mouth daily.     . insulin aspart (NOVOLOG) 100 UNIT/ML injection Inject 0-16 Units into the skin. Pt on insulin pump.  Total of 16 units per day.    . methenamine (HIPREX) 1 g tablet Take 1 g by mouth 2 (two) times daily with a meal.    . mycophenolate (MYFORTIC) 180 MG EC tablet Take 540 mg by mouth 2 (two) times daily. (540mg ) in the morning and bedtime-( 3 capsules )    . pantoprazole (PROTONIX) 40 MG tablet Take 40 mg by mouth at bedtime.     . rosuvastatin (CRESTOR) 10 MG tablet Take 1 tablet by mouth once daily 90 tablet 0  .  tacrolimus (PROGRAF) 1 MG capsule Take 2 mg by mouth 2 (two) times daily.      No current facility-administered medications on file prior to visit.    Cardiac Studies:   Echocardiogram 03/30/2011: Reveals normal LV systolic function. Mild diastolic dysfunction. Trivial pericardial effusion.LVH.   [employer] study, Renal duplex 09/07/2013: Patent transplant vasculature, resistive indicis are increasing. The renal artery velocity at the anastomosis has increased from prior study, However this may indicate elevated velocity in the proximal iliac artery rather than presence of true stenosis.  PV Angio 10/06/2015: S/P Right EIA 6x39 and 6x60 mm balloon expandable and self expanding stent, 90% to 0%. Right SFA 85% to 0% with 5x80 mm Self expanding stent. Two vessel r/o with AT and diffusely disease Peroneal. No runoff at the ankle. Filled by faint collaterals. Right CIA stent placed 09/19/15, 6x14 mm stent patent.  Carotid artery duplex 10/29/2016: No hemodynamically significant arterial disease in the internal carotid artery bilaterally. No significnat plaque burden noted bilaterally. Antegrade right vertebral artery flow. Antegrade left vertebral artery flow. No significant change from Carotid duplex 08/20/12.  Peripheral arteriogram 08/12/2016: Percutaneous transluminal angioplasty of the entire left SFA and proximal popliteal artery with 4 mm diameter Lutonix drug-coated angioplasty balloons   EKG:  EKG 03/06/2020: Normal sinus rhythm with rate of 89 bpm, left atrial enlargement, normal axis. Poor R wave progression, probably normal variant. No  evidence of ischemia.  No significant change from EKG 09/02/2019   Assessment     ICD-10-CM   1. Essential hypertension  I10 EKG 12-Lead    labetalol (NORMODYNE) 100 MG tablet  2. Peripheral artery disease (HCC)  I73.9   3. Orthostatic hypotension  I95.1   4. Type 2 diabetes mellitus with peripheral neuropathy (HCC)  E11.42   5. Class 2 severe  obesity due to excess calories with serious comorbidity and body mass index (BMI) of 35.0 to 35.9 in adult [facility])  E66.01    Z68.35     Meds ordered this encounter  Medications  . labetalol (NORMODYNE) 100 MG tablet    Sig: Take 1 tablet (100 mg total) by mouth 2 (two) times daily.    Dispense:  120 tablet    Refill:  1   Medications Discontinued During This Encounter  Medication Reason  . 0.9 %  sodium chloride infusion Error  . labetalol (NORMODYNE) 100 MG tablet     EKG 03/22/2019: Sinus tachycardia at rate of 102 bpm, left atrial enlargement, normal axis.  Poor R-wave progression, probably normal variant.  No evidence of ischemia.  Normal QT interval. No significant change from  EKG 11/11/18: Sinus tachycardia cardiac rate of 104 beats.  Recommendations:   Jeanetta "Nicci" Pacholski  is a 49 y.o. with renal transplantation due to diabetic and hypertensive renal disease on 01/23/2013 at [facility] and followed by Dr. Elmarie Shiley. She has Essential HTN, orthostatic hypotension due to diabetic neuropathy, diabetic retinopathy and severe PAD with right common iliac artery stenting on 09/19/2015, again on 10/06/2015 had stenting of the right external iliac artery but eventually underwent RAKA in 2017 and walks with prosthesis. She has had left SFA angioplasty in Feb 2019 by Dr. Lucky Cowboy in [place].  There is no limb threatening ischemia, symptoms of claudication are remained stable.  She has continued to gain weight, she is to be petite before in the past 1 year she has gained significant amount of weight and in view of diabetes mellitus, she would be considered morbidly obese.  Her BMI is 35.  With regard to hypertension, she has significant fluctuation in blood pressure today, at rest we did obtain a blood pressure of 676 mmHg systolic but mostly her blood pressure have been 160 mmHg range.  She was not significantly orthostatic today on my exam which I repeated but not documented.  I have  increased the dose of labetalol from 100 mg daily to 1 mg p.o. twice daily.  She was previously extremely sensitive to blood pressure medication especially labetalol.  She was also having frequent episodes of syncope related to frequent UTI which has fortunately resolved over the past 3 months.  External labs reviewed, lipids are under good control, diabetes is also much improved.  I will see her back in 3 months for follow-up.   Adrian Prows, MD, [facility] 03/06/2020, 2:07 PM Office: [phone]   CC: Hassan Buckler, MD (Endocrine); Elmarie Shiley, MD  (Nephro), Isla Pence, MD (PCP)

## 2020-03-07 NOTE — Telephone Encounter (Signed)
Error

## 2020-03-08 DIAGNOSIS — E1165 Type 2 diabetes mellitus with hyperglycemia: Secondary | ICD-10-CM | POA: Diagnosis not present

## 2020-03-08 DIAGNOSIS — Z794 Long term (current) use of insulin: Secondary | ICD-10-CM | POA: Diagnosis not present

## 2020-04-07 DIAGNOSIS — E1165 Type 2 diabetes mellitus with hyperglycemia: Secondary | ICD-10-CM | POA: Diagnosis not present

## 2020-04-07 DIAGNOSIS — Z794 Long term (current) use of insulin: Secondary | ICD-10-CM | POA: Diagnosis not present

## 2020-04-17 DIAGNOSIS — I10 Essential (primary) hypertension: Secondary | ICD-10-CM | POA: Diagnosis not present

## 2020-04-17 DIAGNOSIS — E1165 Type 2 diabetes mellitus with hyperglycemia: Secondary | ICD-10-CM | POA: Diagnosis not present

## 2020-04-17 DIAGNOSIS — R809 Proteinuria, unspecified: Secondary | ICD-10-CM | POA: Diagnosis not present

## 2020-04-17 DIAGNOSIS — Z940 Kidney transplant status: Secondary | ICD-10-CM | POA: Diagnosis not present

## 2020-04-24 DIAGNOSIS — R7309 Other abnormal glucose: Secondary | ICD-10-CM | POA: Diagnosis not present

## 2020-04-24 DIAGNOSIS — N182 Chronic kidney disease, stage 2 (mild): Secondary | ICD-10-CM | POA: Diagnosis not present

## 2020-04-25 DIAGNOSIS — Z940 Kidney transplant status: Secondary | ICD-10-CM | POA: Diagnosis not present

## 2020-04-25 DIAGNOSIS — I129 Hypertensive chronic kidney disease with stage 1 through stage 4 chronic kidney disease, or unspecified chronic kidney disease: Secondary | ICD-10-CM | POA: Diagnosis not present

## 2020-04-25 DIAGNOSIS — N2581 Secondary hyperparathyroidism of renal origin: Secondary | ICD-10-CM | POA: Diagnosis not present

## 2020-04-25 DIAGNOSIS — N182 Chronic kidney disease, stage 2 (mild): Secondary | ICD-10-CM | POA: Diagnosis not present

## 2020-04-25 DIAGNOSIS — D631 Anemia in chronic kidney disease: Secondary | ICD-10-CM | POA: Diagnosis not present

## 2020-04-26 ENCOUNTER — Other Ambulatory Visit: Payer: Self-pay | Admitting: Cardiology

## 2020-04-26 DIAGNOSIS — I10 Essential (primary) hypertension: Secondary | ICD-10-CM

## 2020-05-08 DIAGNOSIS — E1165 Type 2 diabetes mellitus with hyperglycemia: Secondary | ICD-10-CM | POA: Diagnosis not present

## 2020-05-08 DIAGNOSIS — Z794 Long term (current) use of insulin: Secondary | ICD-10-CM | POA: Diagnosis not present

## 2020-06-01 ENCOUNTER — Ambulatory Visit (INDEPENDENT_AMBULATORY_CARE_PROVIDER_SITE_OTHER): Payer: Medicare Other | Admitting: Physician Assistant

## 2020-06-01 ENCOUNTER — Encounter: Payer: Self-pay | Admitting: Orthopedic Surgery

## 2020-06-01 DIAGNOSIS — Z89511 Acquired absence of right leg below knee: Secondary | ICD-10-CM | POA: Diagnosis not present

## 2020-06-01 NOTE — Progress Notes (Signed)
Office Visit Note   Patient: [name]           Date of Birth: 07-17-1970           MRN: [id number] Visit Date: 06/01/2020              Requested by: Benito Mccreedy, MD [address] [place],  [place] [address] PCP: Benito Mccreedy, MD  Chief Complaint  Patient presents with  . Right Leg - Follow-up    11/2015 right BKA prosthetic evaluation       HPI: Patient is a pleasant 49 year old woman who is 4 years status post right below-knee amputation.  She has lost significant volume in her amputation stump and her prosthetic no longer fits appropriately.  It has significant instability when she walks.  She is here for a new prescription  Assessment & Plan: Visit Diagnoses: No diagnosis found.  Plan prescription was provided to [facility] for prosthetic and supplies.  She may follow-up with [place] as needed  Follow-Up Instructions: No follow-ups on file.   Ortho Exam  Patient is alert, oriented, no adenopathy, well-dressed, normal affect, normal respiratory effort. Right below-knee amputation stump in good condition.  No open areas or skin irritations she does however have significant volume loss incompatible with her current socket Patient is an existing right transtibial  amputee.  Patient's current comorbidities are not expected to impact the ability to function with the prescribed prosthesis. Patient verbally communicates a strong desire to use a prosthesis. Patient currently requires mobility aids to ambulate without a prosthesis.  Expects not to use mobility aids with a new prosthesis.  Patient is a K3 level ambulator that spends a lot of time walking around on uneven terrain over obstacles, up and down stairs, and ambulates with a variable cadence.    Imaging: No results found. No images are attached to the encounter.  Labs: Lab Results  Component Value Date   HGBA1C 8.3 (H) 10/13/2015   HGBA1C 6.3 (H) 12/20/2012   HGBA1C 6.1 (H) 09/27/2011    REPTSTATUS 12/09/2015 FINAL 12/06/2015   CULT 40,000 COLONIES/mL ESCHERICHIA COLI (A) 12/06/2015   LABORGA ESCHERICHIA COLI (A) 12/06/2015     Lab Results  Component Value Date   ALBUMIN 3.5 06/28/2018   ALBUMIN 1.8 (L) 12/10/2015   ALBUMIN 1.8 (L) 12/09/2015    Lab Results  Component Value Date   MG 1.9 12/11/2015   MG 2.2 12/09/2015   MG 2.5 (H) 12/08/2015   Lab Results  Component Value Date   VD25OH 24 (L) 09/28/2011    No results found for: PREALBUMIN CBC EXTENDED Latest Ref Rng & Units 07/28/2019 06/28/2018 04/26/2016  WBC 4.0 - 10.5 K/uL 5.0 3.1(L) -  RBC 3.87 - 5.11 MIL/uL 4.98 4.78 -  HGB 12.0 - 15.0 g/dL 12.2 11.4(L) 12.6  HCT 36.0 - 46.0 % 40.3 38.5 -  PLT 150 - 400 K/uL 201 152 -  NEUTROABS 1.7 - 7.7 K/uL 3.5 1.9 -  LYMPHSABS 0.7 - 4.0 K/uL 0.9 0.7 -     There is no height or weight on file to calculate BMI.  Orders:  No orders of the defined types were placed in this encounter.  No orders of the defined types were placed in this encounter.    Procedures: No procedures performed  Clinical Data: No additional findings.  ROS:  All other systems negative, except as noted in the HPI. Review of Systems  Objective: Vital Signs: There were no vitals taken  for this visit.  Specialty Comments:  No specialty comments available.  PMFS History: Patient Active Problem List   Diagnosis Date Noted  . Neurogenic orthostatic hypotension ([facility]) 08/04/2018  . Bilateral carotid bruits 08/04/2018  . Immunosuppression ([facility]) 08/04/2018  . CMV (cytomegalovirus infection) ([facility]) 08/04/2018  . Steal syndrome as complication of dialysis access ([facility]) 03/28/2017  . History of renal transplant 12/06/2015  . Below knee amputation status 11/29/2015  . S/P unilateral BKA (below knee amputation) ([facility])   . Type 2 diabetes mellitus with peripheral neuropathy ([facility])   . Chronic kidney disease, stage IV (severe) ([facility])   . Essential hypertension   . Anemia in chronic kidney  disease   . PAD (peripheral artery disease) ([facility]) 09/18/2015  . Leukopenia 08/22/2014  . Physical deconditioning 10/09/2011  . GOUT 12/21/2008  . Uncontrolled type 2 diabetes mellitus with peripheral artery disease ([facility]) 08/14/2006  . Hypercholesteremia 08/14/2006  . FIBROADENOSIS, BREAST 08/14/2006  . Irregular menstrual cycle 08/14/2006   Past Medical History:  Diagnosis Date  . Anemia   . CHF (congestive heart failure) ([facility])   . Critical lower limb ischemia 10/05/2015  . GERD (gastroesophageal reflux disease)   . High cholesterol   . History of blood transfusion    related to "kidney transplant"  . Hypercholesteremia 08/14/2006   Qualifier: Diagnosis of  By: Eusebio Friendly    . Hypertension   . Mechanical complication of other vascular device, implant, and graft 09/22/2012  . Metabolic bone disease   . PAD (peripheral artery disease) ([facility])   . Pericardial effusion   . Retinopathy   . Type II diabetes mellitus ([facility])    "controlled with diet"    Family History  Problem Relation Age of Onset  . Malignant hyperthermia Mother   . Thyroid disease Mother   . Hypertension Mother   . Malignant hyperthermia Father   . Hypertension Father   . Diabetes Brother   . Anesthesia problems Neg Hx     Past Surgical History:  Procedure Laterality Date  . ABOVE KNEE LEG AMPUTATION Right   . AMPUTATION Right 10/13/2015   Procedure: Right Transmetatarsal Amputation;  Surgeon: Newt Minion, MD;  Location: [facility];  Service: Orthopedics;  Laterality: Right;  . AMPUTATION Right 11/29/2015   Procedure: RIGHT BELOW KNEE AMPUTATION;  Surgeon: Newt Minion, MD;  Location: [facility];  Service: Orthopedics;  Laterality: Right;  . AMPUTATION FINGER     Rt hand middle finger  . AV FISTULA PLACEMENT  10/04/2011   Procedure: INSERTION OF ARTERIOVENOUS (AV) GORE-TEX GRAFT ARM;  Surgeon: Angelia Mould, MD;  Location: [facility];  Service: Vascular;  Laterality: Left;  Insertion left upper arm Arteriovenous  goretex graft  . AV FISTULA PLACEMENT  10/29/2011   Procedure: ARTERIOVENOUS (AV) FISTULA CREATION;  Surgeon: Angelia Mould, MD;  Location: [facility] OR;  Service: Vascular;  Laterality: Right;  Creation Right Arteriovenous Fistula   . [place] REMOVAL  10/04/2011   Procedure: REMOVAL OF ARTERIOVENOUS GORETEX GRAFT ([place]);  Surgeon: Elam Dutch, MD;  Location: [facility];  Service: Vascular;  Laterality: Left;  . CHOLECYSTECTOMY N/A 12/08/2015   Procedure: LAPAROSCOPIC CHOLECYSTECTOMY WITH INTRAOPERATIVE CHOLANGIOGRAM;  Surgeon: Stark Klein, MD;  Location: [facility];  Service: General;  Laterality: N/A;  . ESOPHAGOGASTRODUODENOSCOPY N/A 12/24/2012   Procedure: ESOPHAGOGASTRODUODENOSCOPY (EGD);  Surgeon: Milus Banister, MD;  Location: [facility];  Service: Endoscopy;  Laterality: N/A;  . FINGER AMPUTATION Right 02/26/2013   "3rd finger"  . INSERTION OF DIALYSIS  CATHETER  10/04/2011   Procedure: INSERTION OF DIALYSIS CATHETER;  Surgeon: Angelia Mould, MD;  Location: [facility];  Service: Vascular;  Laterality: Right;  insertion of dialysis catheter right internal jugular  . INSERTION OF DIALYSIS CATHETER  06/23/2012   Procedure: INSERTION OF DIALYSIS CATHETER;  Surgeon: Angelia Mould, MD;  Location: [facility];  Service: Vascular;  Laterality: N/A;  Ultrasound guided  . KIDNEY TRANSPLANT  01/24/2013  . LOWER EXTREMITY ANGIOGRAPHY Left 08/12/2016   Procedure: Lower Extremity Angiography;  Surgeon: Algernon Huxley, MD;  Location: [facility];  Service: Cardiovascular;  Laterality: Left;  . LOWER EXTREMITY INTERVENTION  08/12/2016   Procedure: Lower Extremity Intervention;  Surgeon: Algernon Huxley, MD;  Location: [facility];  Service: Cardiovascular;;  . NEPHRECTOMY TRANSPLANTED ORGAN    . PATCH ANGIOPLASTY  06/23/2012   Procedure: PATCH ANGIOPLASTY;  Surgeon: Angelia Mould, MD;  Location: [facility];  Service: Vascular;  Laterality: Right;  . PERIPHERAL VASCULAR CATHETERIZATION N/A 09/19/2015    Procedure: Lower Extremity Angiography;  Surgeon: Adrian Prows, MD;  Location: [facility];  Service: Cardiovascular;  Laterality: N/A;  . PERIPHERAL VASCULAR CATHETERIZATION N/A 09/19/2015   Procedure: Abdominal Aortogram;  Surgeon: Adrian Prows, MD;  Location: [facility];  Service: Cardiovascular;  Laterality: N/A;  . PERIPHERAL VASCULAR CATHETERIZATION Right 09/19/2015   Procedure: Peripheral Vascular Intervention;  Surgeon: Adrian Prows, MD;  Location: [facility];  Service: Cardiovascular;  Laterality: Right;  Right Common  Iliac  . PERIPHERAL VASCULAR CATHETERIZATION N/A 10/06/2015   Procedure: Lower Extremity Angiography;  Surgeon: Adrian Prows, MD;  Location: [facility];  Service: Cardiovascular;  Laterality: N/A;  . PERIPHERAL VASCULAR CATHETERIZATION  10/06/2015   Procedure: Peripheral Vascular Intervention;  Surgeon: Adrian Prows, MD;  Location: [facility];  Service: Cardiovascular;;  REIA  Omnilink 6.0x29, innova 7x80  RSFA innova 5x80  . REVISON OF ARTERIOVENOUS FISTULA  06/23/2012   Procedure: REVISON OF ARTERIOVENOUS FISTULA;  Surgeon: Angelia Mould, MD;  Location: [facility];  Service: Vascular;  Laterality: Right;  Ultrasound guided  . SHUNTOGRAM N/A 04/06/2012   Procedure: [place];  Surgeon: Angelia Mould, MD;  Location: [facility] CATH LAB;  Service: Cardiovascular;  Laterality: N/A;   Social History   Occupational History  . Not on file  Tobacco Use  . Smoking status: Never Smoker  . Smokeless tobacco: Never Used  Vaping Use  . Vaping Use: Never used  Substance and Sexual Activity  . Alcohol use: No  . Drug use: No  . Sexual activity: Never

## 2020-06-05 ENCOUNTER — Other Ambulatory Visit: Payer: Self-pay

## 2020-06-05 ENCOUNTER — Ambulatory Visit: Payer: Medicare Other | Admitting: Cardiology

## 2020-06-05 ENCOUNTER — Encounter: Payer: Self-pay | Admitting: Cardiology

## 2020-06-05 VITALS — BP 187/76 | HR 91 | Resp 16 | Ht 60.0 in | Wt 181.6 lb

## 2020-06-05 DIAGNOSIS — I10 Essential (primary) hypertension: Secondary | ICD-10-CM | POA: Diagnosis not present

## 2020-06-05 DIAGNOSIS — I739 Peripheral vascular disease, unspecified: Secondary | ICD-10-CM | POA: Diagnosis not present

## 2020-06-05 DIAGNOSIS — E1151 Type 2 diabetes mellitus with diabetic peripheral angiopathy without gangrene: Secondary | ICD-10-CM

## 2020-06-05 DIAGNOSIS — Z794 Long term (current) use of insulin: Secondary | ICD-10-CM | POA: Diagnosis not present

## 2020-06-05 DIAGNOSIS — I951 Orthostatic hypotension: Secondary | ICD-10-CM | POA: Diagnosis not present

## 2020-06-05 MED ORDER — LABETALOL HCL 100 MG PO TABS: 100.0000 mg | ORAL_TABLET | Freq: Two times a day (BID) | ORAL | 3 refills | Status: DC

## 2020-06-05 NOTE — Progress Notes (Signed)
Primary Physician/Referring:  Benito Mccreedy, MD  Patient ID: [name], female    DOB: 02-13-1971, 49 y.o.   MRN: [id number]  Chief Complaint  Patient presents with  . Hypertension  . Follow-up  . orthostatic hypotenstion   HPI:    [name]  is a 49 y.o.  with renal transplantation due to diabetic and hypertensive renal disease on 01/23/2013 at [facility] and followed by Dr. Elmarie Shiley. She has Essential HTN, orthostatic hypotension due to diabetic neuropathy, diabetic retinopathy and severe PAD with right common iliac artery stenting on 09/19/2015, again on 10/06/2015 had stenting of the right external iliac artery but eventually underwent RAKA in 2017 and walks with prosthesis. She has had left SFA angioplasty in Feb 2019 by Dr. Lucky Cowboy in [place].  She was having frequent episodes of syncope related to frequent UTI about 6 months to a year ago.  Fortunately she has not had any recurrence of UTI or syncope.  She now presents for follow-up of hypertension, on her last office visit I increased labetalol from 50 mg to 100 mg twice daily.  She also has underlying diabetic autonomic insufficiency and orthostatic hypotension as well.  She remains asymptomatic.  Past Medical History:  Diagnosis Date  . Anemia   . CHF (congestive heart failure) ([place])   . Critical lower limb ischemia ([place]) 10/05/2015  . GERD (gastroesophageal reflux disease)   . High cholesterol   . History of blood transfusion    related to "kidney transplant"  . Hypercholesteremia 08/14/2006   Qualifier: Diagnosis of  By: Eusebio Friendly    . Hypertension   . Mechanical complication of other vascular device, implant, and graft 09/22/2012  . Metabolic bone disease   . PAD (peripheral artery disease) ([place])   . Pericardial effusion   . Retinopathy   . Type II diabetes mellitus ([place])    "controlled with diet"   Past Surgical History:  Procedure Laterality Date  . ABOVE KNEE LEG AMPUTATION Right   .  AMPUTATION Right 10/13/2015   Procedure: Right Transmetatarsal Amputation;  Surgeon: Newt Minion, MD;  Location: [place];  Service: Orthopedics;  Laterality: Right;  . AMPUTATION Right 11/29/2015   Procedure: RIGHT BELOW KNEE AMPUTATION;  Surgeon: Newt Minion, MD;  Location: [place];  Service: Orthopedics;  Laterality: Right;  . AMPUTATION FINGER     Rt hand middle finger  . AV FISTULA PLACEMENT  10/04/2011   Procedure: INSERTION OF ARTERIOVENOUS (AV) GORE-TEX GRAFT ARM;  Surgeon: Angelia Mould, MD;  Location: [place];  Service: Vascular;  Laterality: Left;  Insertion left upper arm Arteriovenous goretex graft  . AV FISTULA PLACEMENT  10/29/2011   Procedure: ARTERIOVENOUS (AV) FISTULA CREATION;  Surgeon: Angelia Mould, MD;  Location: [facility] OR;  Service: Vascular;  Laterality: Right;  Creation Right Arteriovenous Fistula   . [place] REMOVAL  10/04/2011   Procedure: REMOVAL OF ARTERIOVENOUS GORETEX GRAFT ([place]);  Surgeon: Elam Dutch, MD;  Location: [place];  Service: Vascular;  Laterality: Left;  . CHOLECYSTECTOMY N/A 12/08/2015   Procedure: LAPAROSCOPIC CHOLECYSTECTOMY WITH INTRAOPERATIVE CHOLANGIOGRAM;  Surgeon: Stark Klein, MD;  Location: [place];  Service: General;  Laterality: N/A;  . ESOPHAGOGASTRODUODENOSCOPY N/A 12/24/2012   Procedure: ESOPHAGOGASTRODUODENOSCOPY (EGD);  Surgeon: Milus Banister, MD;  Location: [place];  Service: Endoscopy;  Laterality: N/A;  . FINGER AMPUTATION Right 02/26/2013   "3rd finger"  . INSERTION OF DIALYSIS CATHETER  10/04/2011   Procedure: INSERTION OF DIALYSIS CATHETER;  Surgeon: Angelia Mould, MD;  Location: [place];  Service: Vascular;  Laterality: Right;  insertion of dialysis catheter right internal jugular  . INSERTION OF DIALYSIS CATHETER  06/23/2012   Procedure: INSERTION OF DIALYSIS CATHETER;  Surgeon: Angelia Mould, MD;  Location: [place];  Service: Vascular;  Laterality: N/A;  Ultrasound guided  . KIDNEY TRANSPLANT  01/24/2013  .  LOWER EXTREMITY ANGIOGRAPHY Left 08/12/2016   Procedure: Lower Extremity Angiography;  Surgeon: Algernon Huxley, MD;  Location: [place] CV LAB;  Service: Cardiovascular;  Laterality: Left;  . LOWER EXTREMITY INTERVENTION  08/12/2016   Procedure: Lower Extremity Intervention;  Surgeon: Algernon Huxley, MD;  Location: [place] CV LAB;  Service: Cardiovascular;;  . NEPHRECTOMY TRANSPLANTED ORGAN    . PATCH ANGIOPLASTY  06/23/2012   Procedure: PATCH ANGIOPLASTY;  Surgeon: Angelia Mould, MD;  Location: [place];  Service: Vascular;  Laterality: Right;  . PERIPHERAL VASCULAR CATHETERIZATION N/A 09/19/2015   Procedure: Lower Extremity Angiography;  Surgeon: Adrian Prows, MD;  Location: [place] [facility];  Service: Cardiovascular;  Laterality: N/A;  . PERIPHERAL VASCULAR CATHETERIZATION N/A 09/19/2015   Procedure: Abdominal Aortogram;  Surgeon: Adrian Prows, MD;  Location: [place] [facility];  Service: Cardiovascular;  Laterality: N/A;  . PERIPHERAL VASCULAR CATHETERIZATION Right 09/19/2015   Procedure: Peripheral Vascular Intervention;  Surgeon: Adrian Prows, MD;  Location: [place] [facility];  Service: Cardiovascular;  Laterality: Right;  Right Common  Iliac  . PERIPHERAL VASCULAR CATHETERIZATION N/A 10/06/2015   Procedure: Lower Extremity Angiography;  Surgeon: Adrian Prows, MD;  Location: [place] [facility];  Service: Cardiovascular;  Laterality: N/A;  . PERIPHERAL VASCULAR CATHETERIZATION  10/06/2015   Procedure: Peripheral Vascular Intervention;  Surgeon: Adrian Prows, MD;  Location: [place] [facility];  Service: Cardiovascular;;  REIA  Omnilink 6.0x29, innova 7x80  RSFA innova 5x80  . REVISON OF ARTERIOVENOUS FISTULA  06/23/2012   Procedure: REVISON OF ARTERIOVENOUS FISTULA;  Surgeon: Angelia Mould, MD;  Location: [place];  Service: Vascular;  Laterality: Right;  Ultrasound guided  . SHUNTOGRAM N/A 04/06/2012   Procedure: Earney Mallet;  Surgeon: Angelia Mould, MD;  Location: [facility] C[facility] LAB;  Service:  Cardiovascular;  Laterality: N/A;   Social History   Tobacco Use  . Smoking status: Never Smoker  . Smokeless tobacco: Never Used  Substance Use Topics  . Alcohol use: No   Marital Status: Single   ROS  Review of Systems  Constitutional: Negative for weight gain.  Cardiovascular: Negative for chest pain, claudication, dyspnea on exertion and leg swelling.  Gastrointestinal: Negative for melena.  Neurological: Positive for dizziness (occasional).   Objective   Blood pressure (!) 187/76, pulse 91, resp. rate 16, height 5' (1.524 m), weight 181 lb 9.6 oz (82.4 kg), SpO2 100 %. Body mass index is 35.47 kg/m.   Vitals with BMI 06/05/2020 06/05/2020 03/06/2020  Height 5\' 0"  5\' 0"  5\' 0"   Weight - 181 lbs 10 oz 180 lbs  BMI - 24.40 10.27  Systolic - 253 664  Diastolic - 76 87  Pulse - 91 98    Orthostatic VS for the past 72 hrs (Last 3 readings):  Orthostatic BP Patient Position BP Location Cuff Size Orthostatic Pulse  06/05/20 1440 133/71 Standing Left Arm Large 85  06/05/20 1439 148/81 Sitting Left Arm Large 91  06/05/20 1436 167/78 Supine Left Arm Large 84      Physical Exam Vitals reviewed.  Constitutional:      Comments: She is  shortened stager and moderately obese in no acute distress.  Neck:     Vascular: No JVD.  Cardiovascular:     Rate and Rhythm: Normal rate and regular rhythm.     Pulses: Intact distal pulses.          Carotid pulses are on the right side with bruit and on the left side with bruit.      Femoral pulses are on the right side with bruit and on the left side with bruit.      Popliteal pulses are 2+ on the left side.       Dorsalis pedis pulses are 1+ on the left side.       Posterior tibial pulses are 0 on the left side.     Heart sounds: Normal heart sounds, S1 normal and S2 normal.   Midsystolic murmur is present. Aortic Area  No gallop.      Comments: Right BKA noted. Right arm A-V dialysis fistula noted. NO leg edema.  Pulmonary:      Effort: Pulmonary effort is normal. No accessory muscle usage or respiratory distress.     Breath sounds: Normal breath sounds.  Abdominal:     General: Bowel sounds are normal.     Palpations: Abdomen is soft.     Comments: Epigastric bruit present, No prominent abdominal aortic pulsation.  Musculoskeletal:        General: Deformity (right BKA) present.  Skin:    Comments: Lower Extremity Inspection - Left - Loss of hair and Shiny atrophic skin, No Ulcerations. Right - Inspection Normal(Right below knee amputation, stump has healed well). Palpation - Temperature - Left - Cool. Right - Normal.    Radiology: No results found.  Laboratory examination:   Recent Labs    07/28/19 1823  NA 139  K 3.1*  CL 101  CO2 26  GLUCOSE 67*  BUN 18  CREATININE 1.04*  CALCIUM 9.3  GFRNONAA >60  GFRAA >60   CMP Latest Ref Rng & Units 07/28/2019 06/28/2018 08/12/2016  Glucose 70 - 99 mg/dL 67(L) 102(H) -  BUN 6 - 20 mg/dL 18 17 23(H)  Creatinine 0.44 - 1.00 mg/dL 1.04(H) 1.01(H) 0.98  Sodium 135 - 145 mmol/L 139 139 -  Potassium 3.5 - 5.1 mmol/L 3.1(L) 4.0 -  Chloride 98 - 111 mmol/L 101 108 -  CO2 22 - 32 mmol/L 26 23 -  Calcium 8.9 - 10.3 mg/dL 9.3 9.0 -  Total Protein 6.5 - 8.1 g/dL - 6.5 -  Total Bilirubin 0.3 - 1.2 mg/dL - 0.4 -  Alkaline Phos 38 - 126 U/L - 39 -  AST 15 - 41 U/L - 19 -  ALT 0 - 44 U/L - 12 -   CBC Latest Ref Rng & Units 07/28/2019 06/28/2018 04/26/2016  WBC 4.0 - 10.5 K/uL 5.0 3.1(L) -  Hemoglobin 12.0 - 15.0 g/dL 12.2 11.4(L) 12.6  Hematocrit 36.0 - 46.0 % 40.3 38.5 -  Platelets 150 - 400 K/uL 201 152 -    Lipoprotein (a) 06/03/2019 <75.0 nmol/L <8.4   External labs:  A1C 7.5% 04/17/2020   Cholesterol, total 171.000 M 01/05/2020 HDL 68.000 MG 01/05/2020 LDL-C 67.000 11/02/2019 Triglycerides 125.000 M 01/05/2020  A1C 7.700 % 01/05/2020 TSH 3.660 01/05/2020   Hemoglobin 12.400 G/ 12/27/2019 INR 1.000 10/02/2015 Platelets 202.000 X1 12/27/2019  Creatinine,  Serum 1.070 MG/ 12/27/2019 Potassium 3.100 07/28/2019 Magnesium 2.100 MG/ 05/06/2016 ALT (SGPT) 23.000 IU/ 12/27/2019   Medications and allergies  No Known Allergies  Current Outpatient Medications on File Prior to Visit  Medication Sig Dispense Refill  . albuterol (ACCUNEB) 0.63 MG/3ML nebulizer solution Take 1 ampule by nebulization every 6 (six) hours as needed for wheezing or shortness of breath.     Marland Kitchen albuterol (PROVENTIL HFA;VENTOLIN HFA) 108 (90 BASE) MCG/ACT inhaler Inhale 1 puff into the lungs every 6 (six) hours as needed for wheezing or shortness of breath.    Marland Kitchen amLODipine (NORVASC) 10 MG tablet Take 0.5 tablets (5 mg total) by mouth 2 (two) times daily. If sitting BP >140/80 mm Hg (Patient taking differently: Take 10 mg by mouth daily. If sitting BP >140/80 mm Hg) 90 tablet 3  . diphenoxylate-atropine (LOMOTIL) 2.5-0.025 MG tablet Take 1 tablet by mouth 3 (three) times daily as needed for diarrhea or loose stools.     . fluticasone (FLONASE) 50 MCG/ACT nasal spray Place 2 sprays into both nostrils daily as needed for allergies.     . furosemide (LASIX) 40 MG tablet Take 40 mg by mouth daily.     . insulin aspart (NOVOLOG) 100 UNIT/ML injection Inject 0-16 Units into the skin. Pt on insulin pump.  Total of 16 units per day.    . methenamine (HIPREX) 1 g tablet Take 1 g by mouth 2 (two) times daily with a meal.    . mycophenolate (MYFORTIC) 180 MG EC tablet Take 540 mg by mouth 2 (two) times daily. (540mg ) in the morning and bedtime-( 3 capsules )    . pantoprazole (PROTONIX) 40 MG tablet Take 40 mg by mouth at bedtime.     . rosuvastatin (CRESTOR) 10 MG tablet Take 1 tablet by mouth once daily 90 tablet 0  . tacrolimus (PROGRAF) 1 MG capsule Take 2 mg by mouth 2 (two) times daily.      No current facility-administered medications on file prior to visit.    Cardiac Studies:   Echocardiogram 03/30/2011: Reveals normal LV systolic function. Mild diastolic dysfunction. Trivial  pericardial effusion.LVH.   [employer] study, Renal duplex 09/07/2013: Patent transplant vasculature, resistive indicis are increasing. The renal artery velocity at the anastomosis has increased from prior study, However this may indicate elevated velocity in the proximal iliac artery rather than presence of true stenosis.  PV Angio 10/06/2015: S/P Right EIA 6x39 and 6x60 mm balloon expandable and self expanding stent, 90% to 0%. Right SFA 85% to 0% with 5x80 mm Self expanding stent. Two vessel r/o with AT and diffusely disease Peroneal. No runoff at the ankle. Filled by faint collaterals. Right CIA stent placed 09/19/15, 6x14 mm stent patent.  Carotid artery duplex 10/29/2016: No hemodynamically significant arterial disease in the internal carotid artery bilaterally. No significnat plaque burden noted bilaterally. Antegrade right vertebral artery flow. Antegrade left vertebral artery flow. No significant change from Carotid duplex 08/20/12.  Peripheral arteriogram 08/12/2016: Percutaneous transluminal angioplasty of the entire left SFA and proximal popliteal artery with 4 mm diameter Lutonix drug-coated angioplasty balloons   EKG:   EKG 06/05/2020: Normal sinus rhythm at rate of 86 bpm, normal axis, no evidence of ischemia, normal EKG.  No significant change from 03/06/2020.  Assessment     ICD-10-CM   1. Essential hypertension  I10 EKG 12-Lead    labetalol (NORMODYNE) 100 MG tablet  2. Orthostatic hypotension  I95.1   3. Peripheral artery disease (HCC)  I73.9   4. Controlled type 2 diabetes mellitus with diabetic peripheral angiopathy without gangrene, with long-term current use of insulin (HCC)  E11.51    Z79.4  Meds ordered this encounter  Medications  . labetalol (NORMODYNE) 100 MG tablet    Sig: Take 1 tablet (100 mg total) by mouth 2 (two) times daily.    Dispense:  180 tablet    Refill:  3   Medications Discontinued During This Encounter  Medication Reason  . labetalol  (NORMODYNE) 100 MG tablet Change in therapy  . labetalol (NORMODYNE) 100 MG tablet Reorder     Recommendations:   [name]  is a 49 y.o. with renal transplantation due to diabetic and hypertensive renal disease on 01/23/2013 at [facility] and followed by Dr. Elmarie Shiley. She has Essential HTN, orthostatic hypotension due to diabetic neuropathy, diabetic retinopathy and severe PAD with right common iliac artery stenting on 09/19/2015, again on 10/06/2015 had stenting of the right external iliac artery but eventually underwent RAKA in 2017 and walks with prosthesis. She has had left SFA angioplasty in Feb 2019 by Dr. Lucky Cowboy in [place].  Her blood pressure is not well controlled on the present dose of labetalol.  Do not want to be too aggressive in view of severe orthostatic hypotension as well.  With regard to peripheral arterial disease she has not had any further symptoms of claudication, no further leg cramps, capillary refill is normal, will continue conservative therapy.  I am pleased to know that her diabetes is improving.  She is also trying her best to lose weight.  I reviewed her external labs.  I will see her back in 6 months or sooner if problems.   Adrian Prows, MD, [facility] 06/05/2020, 4:48 PM Office: [phone]

## 2020-06-07 DIAGNOSIS — E1165 Type 2 diabetes mellitus with hyperglycemia: Secondary | ICD-10-CM | POA: Diagnosis not present

## 2020-06-07 DIAGNOSIS — Z794 Long term (current) use of insulin: Secondary | ICD-10-CM | POA: Diagnosis not present

## 2020-06-10 ENCOUNTER — Other Ambulatory Visit: Payer: Self-pay | Admitting: Cardiology

## 2020-06-19 ENCOUNTER — Telehealth: Payer: Self-pay | Admitting: Orthopedic Surgery

## 2020-06-19 DIAGNOSIS — J302 Other seasonal allergic rhinitis: Secondary | ICD-10-CM | POA: Diagnosis not present

## 2020-06-19 DIAGNOSIS — Z794 Long term (current) use of insulin: Secondary | ICD-10-CM | POA: Diagnosis not present

## 2020-06-19 DIAGNOSIS — J4540 Moderate persistent asthma, uncomplicated: Secondary | ICD-10-CM | POA: Diagnosis not present

## 2020-06-19 DIAGNOSIS — E1165 Type 2 diabetes mellitus with hyperglycemia: Secondary | ICD-10-CM | POA: Diagnosis not present

## 2020-06-19 DIAGNOSIS — K219 Gastro-esophageal reflux disease without esophagitis: Secondary | ICD-10-CM | POA: Diagnosis not present

## 2020-06-19 DIAGNOSIS — R55 Syncope and collapse: Secondary | ICD-10-CM | POA: Diagnosis not present

## 2020-06-19 DIAGNOSIS — M62838 Other muscle spasm: Secondary | ICD-10-CM | POA: Diagnosis not present

## 2020-06-19 DIAGNOSIS — I10 Essential (primary) hypertension: Secondary | ICD-10-CM | POA: Diagnosis not present

## 2020-06-19 DIAGNOSIS — E782 Mixed hyperlipidemia: Secondary | ICD-10-CM | POA: Diagnosis not present

## 2020-06-19 NOTE — Telephone Encounter (Signed)
06/01/20 ov note faxed to [place]

## 2020-06-22 DIAGNOSIS — J988 Other specified respiratory disorders: Secondary | ICD-10-CM | POA: Diagnosis not present

## 2020-06-22 DIAGNOSIS — R059 Cough, unspecified: Secondary | ICD-10-CM | POA: Diagnosis not present

## 2020-07-04 ENCOUNTER — Ambulatory Visit (INDEPENDENT_AMBULATORY_CARE_PROVIDER_SITE_OTHER): Payer: Medicare Other | Admitting: Nurse Practitioner

## 2020-07-04 ENCOUNTER — Encounter (INDEPENDENT_AMBULATORY_CARE_PROVIDER_SITE_OTHER): Payer: Medicare Other | Admitting: Nurse Practitioner

## 2020-07-04 ENCOUNTER — Ambulatory Visit (INDEPENDENT_AMBULATORY_CARE_PROVIDER_SITE_OTHER): Payer: Medicare Other | Admitting: Vascular Surgery

## 2020-07-04 DIAGNOSIS — M79674 Pain in right toe(s): Secondary | ICD-10-CM | POA: Diagnosis not present

## 2020-07-04 DIAGNOSIS — L84 Corns and callosities: Secondary | ICD-10-CM | POA: Diagnosis not present

## 2020-07-04 DIAGNOSIS — I739 Peripheral vascular disease, unspecified: Secondary | ICD-10-CM | POA: Diagnosis not present

## 2020-07-04 DIAGNOSIS — B351 Tinea unguium: Secondary | ICD-10-CM | POA: Diagnosis not present

## 2020-07-04 DIAGNOSIS — M2042 Other hammer toe(s) (acquired), left foot: Secondary | ICD-10-CM | POA: Diagnosis not present

## 2020-07-04 DIAGNOSIS — M79675 Pain in left toe(s): Secondary | ICD-10-CM | POA: Diagnosis not present

## 2020-07-06 DIAGNOSIS — Z9641 Presence of insulin pump (external) (internal): Secondary | ICD-10-CM | POA: Diagnosis not present

## 2020-07-06 DIAGNOSIS — Z940 Kidney transplant status: Secondary | ICD-10-CM | POA: Diagnosis not present

## 2020-07-06 DIAGNOSIS — E7800 Pure hypercholesterolemia, unspecified: Secondary | ICD-10-CM | POA: Diagnosis not present

## 2020-07-06 DIAGNOSIS — E1165 Type 2 diabetes mellitus with hyperglycemia: Secondary | ICD-10-CM | POA: Diagnosis not present

## 2020-07-06 DIAGNOSIS — R809 Proteinuria, unspecified: Secondary | ICD-10-CM | POA: Diagnosis not present

## 2020-07-06 DIAGNOSIS — I10 Essential (primary) hypertension: Secondary | ICD-10-CM | POA: Diagnosis not present

## 2020-07-08 DIAGNOSIS — Z794 Long term (current) use of insulin: Secondary | ICD-10-CM | POA: Diagnosis not present

## 2020-07-08 DIAGNOSIS — E1165 Type 2 diabetes mellitus with hyperglycemia: Secondary | ICD-10-CM | POA: Diagnosis not present

## 2020-07-10 ENCOUNTER — Other Ambulatory Visit (INDEPENDENT_AMBULATORY_CARE_PROVIDER_SITE_OTHER): Payer: Self-pay | Admitting: Nurse Practitioner

## 2020-07-10 DIAGNOSIS — K219 Gastro-esophageal reflux disease without esophagitis: Secondary | ICD-10-CM | POA: Diagnosis not present

## 2020-07-10 DIAGNOSIS — I10 Essential (primary) hypertension: Secondary | ICD-10-CM | POA: Diagnosis not present

## 2020-07-10 DIAGNOSIS — M62838 Other muscle spasm: Secondary | ICD-10-CM | POA: Diagnosis not present

## 2020-07-10 DIAGNOSIS — E1165 Type 2 diabetes mellitus with hyperglycemia: Secondary | ICD-10-CM | POA: Diagnosis not present

## 2020-07-10 DIAGNOSIS — J302 Other seasonal allergic rhinitis: Secondary | ICD-10-CM | POA: Diagnosis not present

## 2020-07-10 DIAGNOSIS — Z794 Long term (current) use of insulin: Secondary | ICD-10-CM | POA: Diagnosis not present

## 2020-07-10 DIAGNOSIS — J22 Unspecified acute lower respiratory infection: Secondary | ICD-10-CM | POA: Diagnosis not present

## 2020-07-10 DIAGNOSIS — I739 Peripheral vascular disease, unspecified: Secondary | ICD-10-CM

## 2020-07-10 DIAGNOSIS — Z20822 Contact with and (suspected) exposure to covid-19: Secondary | ICD-10-CM | POA: Diagnosis not present

## 2020-07-10 DIAGNOSIS — J4540 Moderate persistent asthma, uncomplicated: Secondary | ICD-10-CM | POA: Diagnosis not present

## 2020-07-10 DIAGNOSIS — E782 Mixed hyperlipidemia: Secondary | ICD-10-CM | POA: Diagnosis not present

## 2020-07-11 ENCOUNTER — Ambulatory Visit (INDEPENDENT_AMBULATORY_CARE_PROVIDER_SITE_OTHER): Payer: Medicare Other | Admitting: Vascular Surgery

## 2020-07-11 ENCOUNTER — Encounter (INDEPENDENT_AMBULATORY_CARE_PROVIDER_SITE_OTHER): Payer: Medicare Other

## 2020-07-17 DIAGNOSIS — E1165 Type 2 diabetes mellitus with hyperglycemia: Secondary | ICD-10-CM | POA: Diagnosis not present

## 2020-07-17 DIAGNOSIS — U071 COVID-19: Secondary | ICD-10-CM | POA: Diagnosis not present

## 2020-07-24 DIAGNOSIS — Z23 Encounter for immunization: Secondary | ICD-10-CM | POA: Diagnosis not present

## 2020-07-28 DIAGNOSIS — Z89511 Acquired absence of right leg below knee: Secondary | ICD-10-CM | POA: Diagnosis not present

## 2020-08-03 DIAGNOSIS — E1165 Type 2 diabetes mellitus with hyperglycemia: Secondary | ICD-10-CM | POA: Diagnosis not present

## 2020-08-08 DIAGNOSIS — E1165 Type 2 diabetes mellitus with hyperglycemia: Secondary | ICD-10-CM | POA: Diagnosis not present

## 2020-08-08 DIAGNOSIS — Z794 Long term (current) use of insulin: Secondary | ICD-10-CM | POA: Diagnosis not present

## 2020-08-21 DIAGNOSIS — D849 Immunodeficiency, unspecified: Secondary | ICD-10-CM | POA: Diagnosis not present

## 2020-08-21 DIAGNOSIS — N390 Urinary tract infection, site not specified: Secondary | ICD-10-CM | POA: Diagnosis not present

## 2020-08-21 DIAGNOSIS — Z940 Kidney transplant status: Secondary | ICD-10-CM | POA: Diagnosis not present

## 2020-08-24 DIAGNOSIS — E1165 Type 2 diabetes mellitus with hyperglycemia: Secondary | ICD-10-CM | POA: Diagnosis not present

## 2020-08-24 DIAGNOSIS — Z9641 Presence of insulin pump (external) (internal): Secondary | ICD-10-CM | POA: Diagnosis not present

## 2020-08-31 DIAGNOSIS — Z20822 Contact with and (suspected) exposure to covid-19: Secondary | ICD-10-CM | POA: Diagnosis not present

## 2020-09-13 DIAGNOSIS — Z23 Encounter for immunization: Secondary | ICD-10-CM | POA: Diagnosis not present

## 2020-09-15 DIAGNOSIS — N2581 Secondary hyperparathyroidism of renal origin: Secondary | ICD-10-CM | POA: Diagnosis not present

## 2020-09-15 DIAGNOSIS — E785 Hyperlipidemia, unspecified: Secondary | ICD-10-CM | POA: Diagnosis not present

## 2020-09-15 DIAGNOSIS — N182 Chronic kidney disease, stage 2 (mild): Secondary | ICD-10-CM | POA: Diagnosis not present

## 2020-09-15 DIAGNOSIS — N189 Chronic kidney disease, unspecified: Secondary | ICD-10-CM | POA: Diagnosis not present

## 2020-09-21 DIAGNOSIS — N182 Chronic kidney disease, stage 2 (mild): Secondary | ICD-10-CM | POA: Diagnosis not present

## 2020-09-21 DIAGNOSIS — N2581 Secondary hyperparathyroidism of renal origin: Secondary | ICD-10-CM | POA: Diagnosis not present

## 2020-09-21 DIAGNOSIS — Z940 Kidney transplant status: Secondary | ICD-10-CM | POA: Diagnosis not present

## 2020-09-21 DIAGNOSIS — I129 Hypertensive chronic kidney disease with stage 1 through stage 4 chronic kidney disease, or unspecified chronic kidney disease: Secondary | ICD-10-CM | POA: Diagnosis not present

## 2020-09-21 DIAGNOSIS — N390 Urinary tract infection, site not specified: Secondary | ICD-10-CM | POA: Diagnosis not present

## 2020-09-21 DIAGNOSIS — D631 Anemia in chronic kidney disease: Secondary | ICD-10-CM | POA: Diagnosis not present

## 2020-09-29 DIAGNOSIS — Z794 Long term (current) use of insulin: Secondary | ICD-10-CM | POA: Diagnosis not present

## 2020-09-29 DIAGNOSIS — E1165 Type 2 diabetes mellitus with hyperglycemia: Secondary | ICD-10-CM | POA: Diagnosis not present

## 2020-09-30 ENCOUNTER — Other Ambulatory Visit: Payer: Self-pay | Admitting: Cardiology

## 2020-10-05 DIAGNOSIS — S90415A Abrasion, left lesser toe(s), initial encounter: Secondary | ICD-10-CM | POA: Insufficient documentation

## 2020-10-12 DIAGNOSIS — S90415D Abrasion, left lesser toe(s), subsequent encounter: Secondary | ICD-10-CM | POA: Diagnosis not present

## 2020-10-16 DIAGNOSIS — Z940 Kidney transplant status: Secondary | ICD-10-CM | POA: Diagnosis not present

## 2020-10-16 DIAGNOSIS — R809 Proteinuria, unspecified: Secondary | ICD-10-CM | POA: Diagnosis not present

## 2020-10-16 DIAGNOSIS — I10 Essential (primary) hypertension: Secondary | ICD-10-CM | POA: Diagnosis not present

## 2020-10-16 DIAGNOSIS — E1165 Type 2 diabetes mellitus with hyperglycemia: Secondary | ICD-10-CM | POA: Diagnosis not present

## 2020-10-19 ENCOUNTER — Other Ambulatory Visit: Payer: Self-pay | Admitting: Internal Medicine

## 2020-10-19 DIAGNOSIS — Z1231 Encounter for screening mammogram for malignant neoplasm of breast: Secondary | ICD-10-CM

## 2020-10-29 DIAGNOSIS — Z794 Long term (current) use of insulin: Secondary | ICD-10-CM | POA: Diagnosis not present

## 2020-10-29 DIAGNOSIS — E1165 Type 2 diabetes mellitus with hyperglycemia: Secondary | ICD-10-CM | POA: Diagnosis not present

## 2020-11-09 DIAGNOSIS — S90415D Abrasion, left lesser toe(s), subsequent encounter: Secondary | ICD-10-CM | POA: Diagnosis not present

## 2020-11-22 ENCOUNTER — Other Ambulatory Visit: Payer: Self-pay | Admitting: Cardiology

## 2020-12-07 ENCOUNTER — Encounter: Payer: Self-pay | Admitting: Cardiology

## 2020-12-07 ENCOUNTER — Other Ambulatory Visit: Payer: Self-pay

## 2020-12-07 ENCOUNTER — Ambulatory Visit: Payer: Medicare Other | Admitting: Cardiology

## 2020-12-07 VITALS — BP 165/82 | HR 91 | Temp 98.8°F | Resp 16 | Ht 60.0 in | Wt 177.4 lb

## 2020-12-07 DIAGNOSIS — Z940 Kidney transplant status: Secondary | ICD-10-CM

## 2020-12-07 DIAGNOSIS — I951 Orthostatic hypotension: Secondary | ICD-10-CM

## 2020-12-07 DIAGNOSIS — E1143 Type 2 diabetes mellitus with diabetic autonomic (poly)neuropathy: Secondary | ICD-10-CM

## 2020-12-07 DIAGNOSIS — I10 Essential (primary) hypertension: Secondary | ICD-10-CM

## 2020-12-07 DIAGNOSIS — I739 Peripheral vascular disease, unspecified: Secondary | ICD-10-CM

## 2020-12-07 DIAGNOSIS — R0989 Other specified symptoms and signs involving the circulatory and respiratory systems: Secondary | ICD-10-CM

## 2020-12-07 NOTE — Progress Notes (Signed)
Primary Physician/Referring:  Benito Mccreedy, MD  Patient ID: [name], female    DOB: December 14, 1970, 50 y.o.   MRN: [id number]  Chief Complaint  Patient presents with   PAD   Hypertension   Follow-up   HPI:    [name]  is a 50 y.o.  with renal transplantation due to diabetic and hypertensive renal disease on 01/23/2013 at [facility] and followed by Dr. Elmarie Shiley. She has Essential HTN, orthostatic hypotension due to diabetic neuropathy, diabetic retinopathy and severe PAD with right common iliac artery stenting on 09/19/2015, again on 10/06/2015 had stenting of the right external iliac artery but eventually underwent RAKA in 2017 and walks with prosthesis. She has had left SFA angioplasty in Feb 2019 by Dr. Lucky Cowboy in [place].  She was having frequent episodes of syncope related to frequent UTI.  Fortunately she has not had any recurrence of UTI or syncope.  She now presents for follow-up of hypertension. She also has underlying diabetic autonomic insufficiency and orthostatic hypotension as well.  She remains asymptomatic. She has finally started to be strict with diet and has started to loose weight.   Past Medical History:  Diagnosis Date   Anemia    CHF (congestive heart failure) ([place])    Critical lower limb ischemia (HCC) 10/05/2015   GERD (gastroesophageal reflux disease)    High cholesterol    History of blood transfusion    related to "kidney transplant"   Hypercholesteremia 08/14/2006   Qualifier: Diagnosis of  By: Eusebio Friendly     Hypertension    Mechanical complication of other vascular device, implant, and graft 06/23/5535   Metabolic bone disease    PAD (peripheral artery disease) (HCC)    Pericardial effusion    Retinopathy    Type II diabetes mellitus ([place])    "controlled with diet"   Past Surgical History:  Procedure Laterality Date   ABOVE KNEE LEG AMPUTATION Right    AMPUTATION Right 10/13/2015   Procedure: Right Transmetatarsal  Amputation;  Surgeon: Newt Minion, MD;  Location: [place];  Service: Orthopedics;  Laterality: Right;   AMPUTATION Right 11/29/2015   Procedure: RIGHT BELOW KNEE AMPUTATION;  Surgeon: Newt Minion, MD;  Location: [place];  Service: Orthopedics;  Laterality: Right;   AMPUTATION FINGER     Rt hand middle finger   AV FISTULA PLACEMENT  10/04/2011   Procedure: INSERTION OF ARTERIOVENOUS (AV) GORE-TEX GRAFT ARM;  Surgeon: Angelia Mould, MD;  Location: [place];  Service: Vascular;  Laterality: Left;  Insertion left upper arm Arteriovenous goretex graft   AV FISTULA PLACEMENT  10/29/2011   Procedure: ARTERIOVENOUS (AV) FISTULA CREATION;  Surgeon: Angelia Mould, MD;  Location: [place];  Service: Vascular;  Laterality: Right;  Creation Right Arteriovenous Fistula    [place] REMOVAL  10/04/2011   Procedure: REMOVAL OF ARTERIOVENOUS GORETEX GRAFT ([place]);  Surgeon: Elam Dutch, MD;  Location[facility] OR;  Service: Vascular;  Laterality: Left;   CHOLECYSTECTOMY N/A 12/08/2015   Procedure: LAPAROSCOPIC CHOLECYSTECTOMY WITH INTRAOPERATIVE CHOLANGIOGRAM;  Surgeon: Stark Klein, MD;  Location: [place];  Service: General;  Laterality: N/A;   ESOPHAGOGASTRODUODENOSCOPY N/A 12/24/2012   Procedure: ESOPHAGOGASTRODUODENOSCOPY (EGD);  Surgeon: Milus Banister, MD;  Location: [place];  Service: Endoscopy;  Laterality: N/A;   FINGER AMPUTATION Right 02/26/2013   "3rd finger"   INSERTION OF DIALYSIS CATHETER  10/04/2011   Procedure: INSERTION OF DIALYSIS CATHETER;  Surgeon: Angelia Mould, MD;  Location: [place];  Service: Vascular;  Laterality: Right;  insertion of dialysis catheter right internal jugular   INSERTION OF DIALYSIS CATHETER  06/23/2012   Procedure: INSERTION OF DIALYSIS CATHETER;  Surgeon: Angelia Mould, MD;  Location: [place];  Service: Vascular;  Laterality: N/A;  Ultrasound guided   KIDNEY TRANSPLANT  01/24/2013   LOWER EXTREMITY ANGIOGRAPHY Left 08/12/2016   Procedure: Lower Extremity  Angiography;  Surgeon: Algernon Huxley, MD;  Location: [place] [facility];  Service: Cardiovascular;  Laterality: Left;   LOWER EXTREMITY INTERVENTION  08/12/2016   Procedure: Lower Extremity Intervention;  Surgeon: Algernon Huxley, MD;  Location: [place] [facility];  Service: Cardiovascular;;   NEPHRECTOMY TRANSPLANTED ORGAN     PATCH ANGIOPLASTY  06/23/2012   Procedure: PATCH ANGIOPLASTY;  Surgeon: Angelia Mould, MD;  Location: [place];  Service: Vascular;  Laterality: Right;   PERIPHERAL VASCULAR CATHETERIZATION N/A 09/19/2015   Procedure: Lower Extremity Angiography;  Surgeon: Adrian Prows, MD;  Location: [place] [facility];  Service: Cardiovascular;  Laterality: N/A;   PERIPHERAL VASCULAR CATHETERIZATION N/A 09/19/2015   Procedure: Abdominal Aortogram;  Surgeon: Adrian Prows, MD;  Location: [place] [facility];  Service: Cardiovascular;  Laterality: N/A;   PERIPHERAL VASCULAR CATHETERIZATION Right 09/19/2015   Procedure: Peripheral Vascular Intervention;  Surgeon: Adrian Prows, MD;  Location: [place] [facility];  Service: Cardiovascular;  Laterality: Right;  Right Common  Iliac   PERIPHERAL VASCULAR CATHETERIZATION N/A 10/06/2015   Procedure: Lower Extremity Angiography;  Surgeon: Adrian Prows, MD;  Location: [place] [facility];  Service: Cardiovascular;  Laterality: N/A;   PERIPHERAL VASCULAR CATHETERIZATION  10/06/2015   Procedure: Peripheral Vascular Intervention;  Surgeon: Adrian Prows, MD;  Location: [place] [facility];  Service: Cardiovascular;;  REIA  Omnilink 6.0x29, innova 7x80  RSFA innova 5x80   REVISON OF ARTERIOVENOUS FISTULA  06/23/2012   Procedure: REVISON OF ARTERIOVENOUS FISTULA;  Surgeon: Angelia Mould, MD;  Location: [place];  Service: Vascular;  Laterality: Right;  Ultrasound guided   SHUNTOGRAM N/A 04/06/2012   Procedure: [place];  Surgeon: Angelia Mould, MD;  Location: [facility] CATH LAB;  Service: Cardiovascular;  Laterality: N/A;   Social History   Tobacco Use   Smoking status:  Never   Smokeless tobacco: Never  Substance Use Topics   Alcohol use: No   Marital Status: Single   ROS  Review of Systems  Constitutional: Negative for weight gain.  Cardiovascular:  Negative for chest pain, claudication, dyspnea on exertion and leg swelling.  Gastrointestinal:  Negative for melena.  Neurological:  Positive for dizziness (occasional).  Objective   Blood pressure (!) 165/82, pulse 91, temperature 98.8 F (37.1 C), temperature source Temporal, resp. rate 16, height 5' (1.524 m), weight 177 lb 6.4 oz (80.5 kg), SpO2 (!) 88 %. Body mass index is 34.65 kg/m.   Vitals with BMI 12/07/2020 06/05/2020 06/05/2020  Height 5\' 0"  5\' 0"  5\' 0"   Weight 177 lbs 6 oz - 181 lbs 10 oz  BMI 23.76 - 28.31  Systolic 517 - 616  Diastolic 82 - 76  Pulse 91 - 91    Orthostatic VS for the past 72 hrs (Last 3 readings):  Orthostatic BP Patient Position BP Location Cuff Size Orthostatic Pulse  12/07/20 1436 101/66 Standing Left Arm Large 103  12/07/20 1435 154/73 Sitting Left Arm Large 104  12/07/20 1432 195/79 Supine Left Arm Large 96      Physical Exam Vitals reviewed.  Constitutional:      Comments: She  is shortened stager and moderately obese in no acute distress.  Neck:     Vascular: Carotid bruit (bilateral) present. No JVD.  Cardiovascular:     Rate and Rhythm: Normal rate and regular rhythm.     Pulses: Intact distal pulses.          Femoral pulses are  on the right side with bruit and  on the left side with bruit.      Popliteal pulses are 2+ on the left side.       Dorsalis pedis pulses are 1+ on the left side.       Posterior tibial pulses are 0 on the left side.     Heart sounds: Normal heart sounds, S1 normal and S2 normal.  Midsystolic murmur is present. Aortic Area    No gallop.     Comments: Right BKA noted. Right arm A-V dialysis fistula noted.  Pulmonary:     Effort: Pulmonary effort is normal. No accessory muscle usage or respiratory distress.     Breath  sounds: Normal breath sounds.  Abdominal:     General: Bowel sounds are normal.     Palpations: Abdomen is soft.     Comments: Epigastric bruit present, No prominent abdominal aortic pulsation.  Musculoskeletal:        General: Deformity (right BKA) present. No swelling.  Skin:    General: Skin is warm.     Capillary Refill: Right lower extremity capillary refill < 2 Sec    Comments: Lower Extremity Inspection - Left - Loss of hair and Shiny atrophic skin, No Ulcerations. Right - Inspection Normal (Right below knee amputation, stump has healed well). Palpation - Temperature - Left - Cool. Right - Normal.   Radiology: No results found.  Laboratory examination:   No results for input(s): NA, K, CL, CO2, GLUCOSE, BUN, CREATININE, CALCIUM, GFRNONAA, GFRAA in the last 8760 hours.  CMP Latest Ref Rng & Units 07/28/2019 06/28/2018 08/12/2016  Glucose 70 - 99 mg/dL 67(L) 102(H) -  BUN 6 - 20 mg/dL 18 17 23(H)  Creatinine 0.44 - 1.00 mg/dL 1.04(H) 1.01(H) 0.98  Sodium 135 - 145 mmol/L 139 139 -  Potassium 3.5 - 5.1 mmol/L 3.1(L) 4.0 -  Chloride 98 - 111 mmol/L 101 108 -  CO2 22 - 32 mmol/L 26 23 -  Calcium 8.9 - 10.3 mg/dL 9.3 9.0 -  Total Protein 6.5 - 8.1 g/dL - 6.5 -  Total Bilirubin 0.3 - 1.2 mg/dL - 0.4 -  Alkaline Phos 38 - 126 U/L - 39 -  AST 15 - 41 U/L - 19 -  ALT 0 - 44 U/L - 12 -   CBC Latest Ref Rng & Units 07/28/2019 06/28/2018 04/26/2016  WBC 4.0 - 10.5 K/uL 5.0 3.1(L) -  Hemoglobin 12.0 - 15.0 g/dL 12.2 11.4(L) 12.6  Hematocrit 36.0 - 46.0 % 40.3 38.5 -  Platelets 150 - 400 K/uL 201 152 -    Lipoprotein (a) 06/03/2019 <75.0 nmol/L <8.4   External labs:  A1C 7.5% 04/17/2020  Cholesterol, total 171.000 M 01/05/2020 HDL 68.000 MG 01/05/2020 LDL-C 67.000 11/02/2019 Triglycerides 125.000 M 01/05/2020  A1C 7.700 % 01/05/2020 TSH 3.660 01/05/2020   Hemoglobin 12.400 G/ 12/27/2019 INR 1.000 10/02/2015 Platelets 202.000 X1 12/27/2019  Creatinine, Serum 1.070 MG/  12/27/2019 Potassium 3.100 07/28/2019 Magnesium 2.100 MG/ 05/06/2016 ALT (SGPT) 23.000 IU/ 12/27/2019   Medications and allergies  No Known Allergies   Current Outpatient Medications on File Prior to Visit  Medication Sig Dispense Refill  albuterol (ACCUNEB) 0.63 MG/3ML nebulizer solution Take 1 ampule by nebulization every 6 (six) hours as needed for wheezing or shortness of breath.      albuterol (PROVENTIL HFA;VENTOLIN HFA) 108 (90 BASE) MCG/ACT inhaler Inhale 1 puff into the lungs every 6 (six) hours as needed for wheezing or shortness of breath.     amLODipine (NORVASC) 10 MG tablet Take 0.5 tablets (5 mg total) by mouth 2 (two) times daily. If sitting BP >140/80 mm Hg (Patient taking differently: Take 10 mg by mouth daily. If sitting BP >140/80 mm Hg) 90 tablet 3   diphenoxylate-atropine (LOMOTIL) 2.5-0.025 MG tablet Take 1 tablet by mouth 3 (three) times daily as needed for diarrhea or loose stools.      fluticasone (FLONASE) 50 MCG/ACT nasal spray Place 2 sprays into both nostrils daily as needed for allergies.      furosemide (LASIX) 40 MG tablet Take 40 mg by mouth daily.      insulin aspart (NOVOLOG) 100 UNIT/ML injection Inject 0-16 Units into the skin. Pt on insulin pump.  Total of 16 units per day.     labetalol (NORMODYNE) 100 MG tablet Take 1 tablet (100 mg total) by mouth 2 (two) times daily. 180 tablet 3   methenamine (HIPREX) 1 g tablet Take 1 g by mouth 2 (two) times daily with a meal.     mycophenolate (MYFORTIC) 180 MG EC tablet Take 540 mg by mouth 2 (two) times daily. (540mg ) in the morning and bedtime-( 3 capsules )     pantoprazole (PROTONIX) 40 MG tablet Take 40 mg by mouth at bedtime.      rosuvastatin (CRESTOR) 10 MG tablet Take 1 tablet by mouth once daily 90 tablet 0   tacrolimus (PROGRAF) 1 MG capsule Take 2 mg by mouth 2 (two) times daily.      No current facility-administered medications on file prior to visit.   Medications after present  encounter:  Current Outpatient Medications  Medication Instructions   albuterol (ACCUNEB) 0.63 MG/3ML nebulizer solution 1 ampule, Nebulization, Every 6 hours PRN   albuterol (PROVENTIL HFA;VENTOLIN HFA) 108 (90 BASE) MCG/ACT inhaler 1 puff, Inhalation, Every 6 hours PRN   amLODipine (NORVASC) 5 mg, Oral, 2 times daily, If sitting BP >140/80 mm Hg   aspirin 81 mg, Oral, Daily   diphenoxylate-atropine (LOMOTIL) 2.5-0.025 MG tablet 1 tablet, Oral, 3 times daily PRN   fluticasone (FLONASE) 50 MCG/ACT nasal spray 2 sprays, Each Nare, Daily PRN   furosemide (LASIX) 40 mg, Oral, Daily   insulin aspart (NOVOLOG) 0-16 Units, Subcutaneous, Pt on insulin pump.  Total of 16 units per day.   labetalol (NORMODYNE) 100 mg, Oral, 2 times daily   methenamine (HIPREX) 1 g, Oral, 2 times daily with meals   mycophenolate (MYFORTIC) 540 mg, Oral, 2 times daily, (540mg ) in the morning and bedtime-( 3 capsules )   pantoprazole (PROTONIX) 40 mg, Oral, Daily at bedtime   rosuvastatin (CRESTOR) 10 MG tablet Take 1 tablet by mouth once daily   tacrolimus (PROGRAF) 2 mg, Oral, 2 times daily    Cardiac Studies:   Echocardiogram 03/30/2011: Reveals normal LV systolic function. Mild diastolic dysfunction. Trivial pericardial effusion.LVH.    [employer] study, Renal duplex 09/07/2013: Patent transplant vasculature, resistive indicis are increasing. The renal artery velocity at the anastomosis has increased from prior study, However this may indicate elevated velocity in the proximal iliac artery rather than presence of true stenosis.  PV Angio  10/06/2015: S/P Right EIA 1O10  and 6x60 mm balloon expandable and self expanding stent, 90% to 0%. Right SFA 85% to 0% with 5x80 mm Self expanding stent. Two vessel r/o with AT and diffusely disease Peroneal. No runoff at the ankle. Filled by faint collaterals. Right CIA stent placed 09/19/15, 6x14 mm stent patent.  Carotid artery duplex 10/29/2016: No hemodynamically  significant arterial disease in the internal carotid artery bilaterally. No significnat plaque burden noted bilaterally. Antegrade right vertebral artery flow. Antegrade left vertebral artery flow. No significant change from Carotid duplex 08/20/12.  Peripheral arteriogram 08/12/2016: Percutaneous transluminal angioplasty of the entire left SFA and proximal popliteal artery with 4 mm diameter Lutonix drug-coated angioplasty balloons   EKG:  EKG 12/07/2020: Normal sinus rhythm at rate of 86 bpm, normal axis, no evidence of ischemia, normal EKG.  No significant change from EKG 06/05/2020   Assessment     ICD-10-CM   1. Essential hypertension  I10 EKG 12-Lead    2. Orthostatic hypotension  I95.1     3. Peripheral artery disease (HCC)  I73.9 aspirin 81 MG chewable tablet    4. Renal transplant recipient  Z94.0     5. Diabetic autonomic neuropathy associated with type 2 diabetes mellitus (HCC)  E11.43     6. Bilateral carotid bruits  R09.89 PCV CAROTID DUPLEX (BILATERAL)      Meds ordered this encounter  Medications   aspirin 81 MG chewable tablet    Sig: Chew 1 tablet (81 mg total) by mouth daily.    Dispense:  100 tablet    Refill:  2   Medications Discontinued During This Encounter  Medication Reason   aspirin 81 MG chewable tablet Reorder     Recommendations:   Riham "Nicci" Isaacs  is a 50 y.o. with renal transplantation due to diabetic and hypertensive renal disease on 01/23/2013 at [facility] and followed by Dr. Elmarie Shiley. She has Essential HTN, orthostatic hypotension due to diabetic neuropathy, diabetic retinopathy and severe PAD with right common iliac artery stenting on 09/19/2015, again on 10/06/2015 had stenting of the right external iliac artery but eventually underwent RAKA in 2017 and walks with prosthesis. She has had left SFA angioplasty in Feb 2019 by Dr. Lucky Cowboy in [place].  She has severe supine hypertension and orthostatic hypotension with greater than 60-70  mmHg drop in blood pressure on standing.  Do not want to be too aggressive in view of severe orthostatic hypotension as well.  With regard to peripheral arterial disease she has not had any further symptoms of claudication, no further leg cramps, capillary refill is normal, will continue conservative therapy.  She has prominent bilateral carotid bruit and it has been several years ago that I had checked her carotids, will repeat carotid artery duplex.  She has started to lose weight and her diabetes is now controlled.  Renal function has remained stable.  Lipids are well controlled.  She has not had any further ulcerations in the feet and capillary refill is normal in her left leg.  I will see her back in 6 months or sooner if problems.    Adrian Prows, MD, [facility] 12/09/2020, 11:23 AM Office: [phone]

## 2020-12-09 MED ORDER — ASPIRIN 81 MG PO CHEW: 81.0000 mg | CHEWABLE_TABLET | Freq: Every day | ORAL | 2 refills | Status: DC

## 2020-12-12 ENCOUNTER — Encounter (HOSPITAL_COMMUNITY): Payer: Self-pay | Admitting: *Deleted

## 2020-12-12 ENCOUNTER — Ambulatory Visit
Admission: RE | Admit: 2020-12-12 | Discharge: 2020-12-12 | Disposition: A | Discharge status: Home / Self Care | Payer: Medicare Other | Source: Ambulatory Visit | Attending: Internal Medicine | Admitting: Internal Medicine

## 2020-12-12 ENCOUNTER — Other Ambulatory Visit: Payer: Self-pay

## 2020-12-12 DIAGNOSIS — Z1231 Encounter for screening mammogram for malignant neoplasm of breast: Secondary | ICD-10-CM

## 2020-12-14 ENCOUNTER — Other Ambulatory Visit: Payer: Medicare Other

## 2020-12-20 ENCOUNTER — Other Ambulatory Visit: Payer: Self-pay

## 2020-12-20 ENCOUNTER — Ambulatory Visit: Payer: Medicare Other

## 2020-12-20 DIAGNOSIS — R0989 Other specified symptoms and signs involving the circulatory and respiratory systems: Secondary | ICD-10-CM | POA: Diagnosis not present

## 2020-12-20 DIAGNOSIS — I6523 Occlusion and stenosis of bilateral carotid arteries: Secondary | ICD-10-CM

## 2020-12-22 DIAGNOSIS — Z794 Long term (current) use of insulin: Secondary | ICD-10-CM | POA: Diagnosis not present

## 2020-12-22 DIAGNOSIS — E1165 Type 2 diabetes mellitus with hyperglycemia: Secondary | ICD-10-CM | POA: Diagnosis not present

## 2020-12-25 ENCOUNTER — Other Ambulatory Visit (INDEPENDENT_AMBULATORY_CARE_PROVIDER_SITE_OTHER): Payer: Self-pay | Admitting: Nurse Practitioner

## 2020-12-25 DIAGNOSIS — Z940 Kidney transplant status: Secondary | ICD-10-CM

## 2020-12-26 ENCOUNTER — Ambulatory Visit (INDEPENDENT_AMBULATORY_CARE_PROVIDER_SITE_OTHER): Payer: Medicare Other

## 2020-12-26 ENCOUNTER — Ambulatory Visit (INDEPENDENT_AMBULATORY_CARE_PROVIDER_SITE_OTHER): Payer: Medicare Other | Admitting: Vascular Surgery

## 2020-12-26 ENCOUNTER — Other Ambulatory Visit: Payer: Self-pay

## 2020-12-26 VITALS — BP 178/110 | HR 94 | Ht 60.0 in | Wt 180.0 lb

## 2020-12-26 DIAGNOSIS — E1142 Type 2 diabetes mellitus with diabetic polyneuropathy: Secondary | ICD-10-CM | POA: Diagnosis not present

## 2020-12-26 DIAGNOSIS — Z940 Kidney transplant status: Secondary | ICD-10-CM | POA: Diagnosis not present

## 2020-12-26 DIAGNOSIS — I10 Essential (primary) hypertension: Secondary | ICD-10-CM

## 2020-12-26 DIAGNOSIS — I739 Peripheral vascular disease, unspecified: Secondary | ICD-10-CM

## 2020-12-26 DIAGNOSIS — T82898D Other specified complication of vascular prosthetic devices, implants and grafts, subsequent encounter: Secondary | ICD-10-CM

## 2020-12-26 DIAGNOSIS — Z89519 Acquired absence of unspecified leg below knee: Secondary | ICD-10-CM

## 2020-12-26 NOTE — Progress Notes (Signed)
MRN : [id number]  [name] is a 50 y.o. (1970/08/06) female who presents with chief complaint of  Chief Complaint  Patient presents with   Follow-up    1 yr HDA   .  History of Present Illness: Patient returns today in follow up of multiple vascular issues.  She is doing well today without any obvious complaints.  She continues to have good function out of her transplanted kidney and has not needed her right arm access for dialysis in several years now.  We have still been following this annually.  She had issues with steal and had a dril bypass performed many years ago.  Duplex shows a patent right arm AV fistula without any hemodynamically significant stenosis identified.  Current Outpatient Medications  Medication Sig Dispense Refill   albuterol (ACCUNEB) 0.63 MG/3ML nebulizer solution Take 1 ampule by nebulization every 6 (six) hours as needed for wheezing or shortness of breath.      albuterol (PROVENTIL HFA;VENTOLIN HFA) 108 (90 BASE) MCG/ACT inhaler Inhale 1 puff into the lungs every 6 (six) hours as needed for wheezing or shortness of breath.     aspirin 81 MG chewable tablet Chew 1 tablet (81 mg total) by mouth daily. 100 tablet 2   diphenoxylate-atropine (LOMOTIL) 2.5-0.025 MG tablet Take 1 tablet by mouth 3 (three) times daily as needed for diarrhea or loose stools.      fluticasone (FLONASE) 50 MCG/ACT nasal spray Place 2 sprays into both nostrils daily as needed for allergies.      furosemide (LASIX) 40 MG tablet Take 40 mg by mouth daily.      insulin aspart (NOVOLOG) 100 UNIT/ML injection Inject 0-16 Units into the skin. Pt on insulin pump.  Total of 16 units per day.     labetalol (NORMODYNE) 100 MG tablet Take 1 tablet (100 mg total) by mouth 2 (two) times daily. 180 tablet 3   methenamine (HIPREX) 1 g tablet Take 1 g by mouth 2 (two) times daily with a meal.     mycophenolate (MYFORTIC) 180 MG EC tablet Take 540 mg by mouth 2 (two) times daily. (540mg ) in the  morning and bedtime-( 3 capsules )     pantoprazole (PROTONIX) 40 MG tablet Take 40 mg by mouth at bedtime.      rosuvastatin (CRESTOR) 10 MG tablet Take 1 tablet by mouth once daily 90 tablet 0   tacrolimus (PROGRAF) 1 MG capsule Take 2 mg by mouth 2 (two) times daily.      amLODipine (NORVASC) 10 MG tablet Take 0.5 tablets (5 mg total) by mouth 2 (two) times daily. If sitting BP >140/80 mm Hg (Patient taking differently: Take 10 mg by mouth daily. If sitting BP >140/80 mm Hg) 90 tablet 3   Insulin Disposable Pump (OMNIPOD DASH PODS, GEN 4,) MISC SMARTSIG:SUB-Q Every 3 Days     No current facility-administered medications for this visit.    Past Medical History:  Diagnosis Date   Anemia    CHF (congestive heart failure) ([place])    Critical lower limb ischemia ([place]) 10/05/2015   GERD (gastroesophageal reflux disease)    High cholesterol    History of blood transfusion    related to "kidney transplant"   Hypercholesteremia 08/14/2006   Qualifier: Diagnosis of  By: Eusebio Friendly     Hypertension    Mechanical complication of other vascular device, implant, and graft 01/20/7618   Metabolic bone disease    PAD (peripheral artery disease) ([place])  Pericardial effusion    Retinopathy    Type II diabetes mellitus ([place])    "controlled with diet"    Past Surgical History:  Procedure Laterality Date   ABOVE KNEE LEG AMPUTATION Right    AMPUTATION Right 10/13/2015   Procedure: Right Transmetatarsal Amputation;  Surgeon: Newt Minion, MD;  Location: [place];  Service: Orthopedics;  Laterality: Right;   AMPUTATION Right 11/29/2015   Procedure: RIGHT BELOW KNEE AMPUTATION;  Surgeon: Newt Minion, MD;  Location: [place];  Service: Orthopedics;  Laterality: Right;   AMPUTATION FINGER     Rt hand middle finger   AV FISTULA PLACEMENT  10/04/2011   Procedure: INSERTION OF ARTERIOVENOUS (AV) GORE-TEX GRAFT ARM;  Surgeon: Angelia Mould, MD;  Location: [place];  Service: Vascular;  Laterality:  Left;  Insertion left upper arm Arteriovenous goretex graft   AV FISTULA PLACEMENT  10/29/2011   Procedure: ARTERIOVENOUS (AV) FISTULA CREATION;  Surgeon: Angelia Mould, MD;  Location: [place];  Service: Vascular;  Laterality: Right;  Creation Right Arteriovenous Fistula    [place] REMOVAL  10/04/2011   Procedure: REMOVAL OF ARTERIOVENOUS GORETEX GRAFT ([place]);  Surgeon: Elam Dutch, MD;  [facility];  Service: Vascular;  Laterality: Left;   CHOLECYSTECTOMY N/A 12/08/2015   Procedure: LAPAROSCOPIC CHOLECYSTECTOMY WITH INTRAOPERATIVE CHOLANGIOGRAM;  Surgeon: Stark Klein, MD;  Location: [place];  Service: General;  Laterality: N/A;   ESOPHAGOGASTRODUODENOSCOPY N/A 12/24/2012   Procedure: ESOPHAGOGASTRODUODENOSCOPY (EGD);  Surgeon: Milus Banister, MD;  Location: [place];  Service: Endoscopy;  Laterality: N/A;   FINGER AMPUTATION Right 02/26/2013   "3rd finger"   INSERTION OF DIALYSIS CATHETER  10/04/2011   Procedure: INSERTION OF DIALYSIS CATHETER;  Surgeon: Angelia Mould, MD;  Location: [place];  Service: Vascular;  Laterality: Right;  insertion of dialysis catheter right internal jugular   INSERTION OF DIALYSIS CATHETER  06/23/2012   Procedure: INSERTION OF DIALYSIS CATHETER;  Surgeon: Angelia Mould, MD;  Location: [place];  Service: Vascular;  Laterality: N/A;  Ultrasound guided   KIDNEY TRANSPLANT  01/24/2013   LOWER EXTREMITY ANGIOGRAPHY Left 08/12/2016   Procedure: Lower Extremity Angiography;  Surgeon: Algernon Huxley, MD;  Location: [place] [facility];  Service: Cardiovascular;  Laterality: Left;   LOWER EXTREMITY INTERVENTION  08/12/2016   Procedure: Lower Extremity Intervention;  Surgeon: Algernon Huxley, MD;  Location: [place] [facility];  Service: Cardiovascular;;   NEPHRECTOMY TRANSPLANTED ORGAN     PATCH ANGIOPLASTY  06/23/2012   Procedure: PATCH ANGIOPLASTY;  Surgeon: Angelia Mould, MD;  Location: [place];  Service: Vascular;  Laterality: Right;   PERIPHERAL  VASCULAR CATHETERIZATION N/A 09/19/2015   Procedure: Lower Extremity Angiography;  Surgeon: Adrian Prows, MD;  Location: [place] [facility];  Service: Cardiovascular;  Laterality: N/A;   PERIPHERAL VASCULAR CATHETERIZATION N/A 09/19/2015   Procedure: Abdominal Aortogram;  Surgeon: Adrian Prows, MD;  Location: [place] [facility];  Service: Cardiovascular;  Laterality: N/A;   PERIPHERAL VASCULAR CATHETERIZATION Right 09/19/2015   Procedure: Peripheral Vascular Intervention;  Surgeon: Adrian Prows, MD;  Location: [place] [facility];  Service: Cardiovascular;  Laterality: Right;  Right Common  Iliac   PERIPHERAL VASCULAR CATHETERIZATION N/A 10/06/2015   Procedure: Lower Extremity Angiography;  Surgeon: Adrian Prows, MD;  Location: [place] [facility];  Service: Cardiovascular;  Laterality: N/A;   PERIPHERAL VASCULAR CATHETERIZATION  10/06/2015   Procedure: Peripheral Vascular Intervention;  Surgeon: Adrian Prows, MD;  Location: [place] [facility];  Service: Cardiovascular;;  REIA  Omnilink 6.0x29, innova 7x80  RSFA innova 5x80   REVISON OF ARTERIOVENOUS FISTULA  06/23/2012   Procedure: REVISON OF ARTERIOVENOUS FISTULA;  Surgeon: Angelia Mould, MD;  Location: [place];  Service: Vascular;  Laterality: Right;  Ultrasound guided   SHUNTOGRAM N/A 04/06/2012   Procedure: Earney Mallet;  Surgeon: Angelia Mould, MD;  [facility];  Service: Cardiovascular;  Laterality: N/A;     Social History   Tobacco Use   Smoking status: Never   Smokeless tobacco: Never  Vaping Use   Vaping Use: Never used  Substance Use Topics   Alcohol use: No   Drug use: No      Family History  Problem Relation Age of Onset   Malignant hyperthermia Mother    Thyroid disease Mother    Hypertension Mother    Malignant hyperthermia Father    Hypertension Father    Diabetes Brother    Anesthesia problems Neg Hx      No Known Allergies  REVIEW OF SYSTEMS (Negative unless checked)   Constitutional: [] Weight loss  [] Fever   [] Chills Cardiac: [] Chest pain   [] Chest pressure   [] Palpitations   [] Shortness of breath when laying flat   [] Shortness of breath at rest   [] Shortness of breath with exertion. Vascular:  [] Pain in legs with walking   [] Pain in legs at rest   [] Pain in legs when laying flat   [x] Claudication   [] Pain in feet when walking  [] Pain in feet at rest  [] Pain in feet when laying flat   [] History of DVT   [] Phlebitis   [] Swelling in legs   [] Varicose veins   [] Non-healing ulcers Pulmonary:   [] Uses home oxygen   [] Productive cough   [] Hemoptysis   [] Wheeze  [] COPD   [] Asthma Neurologic:  [] Dizziness  [] Blackouts   [] Seizures   [] History of stroke   [] History of TIA  [] Aphasia   [] Temporary blindness   [] Dysphagia   [] Weakness or numbness in arms   [] Weakness or numbness in legs Musculoskeletal:  [] Arthritis   [] Joint swelling   [x] Joint pain   [] Low back pain Hematologic:  [] Easy bruising  [] Easy bleeding   [] Hypercoagulable state   [] Anemic   Gastrointestinal:  [] Blood in stool   [] Vomiting blood  [] Gastroesophageal reflux/heartburn   [] Abdominal pain Genitourinary:  [x] Chronic kidney disease   [] Difficult urination  [] Frequent urination  [] Burning with urination   [] Hematuria Skin:  [] Rashes   [x] Ulcers   [x] Wounds Psychological:  [] History of anxiety   []  History of major depression.  Physical Examination  BP (!) 178/110   Pulse 94   Ht 5' (1.524 m)   Wt 180 lb (81.6 kg)   BMI 35.15 kg/m  Gen:  WD/WN, NAD Head: [place]/AT, No temporalis wasting. Ear/Nose/Throat: Hearing grossly intact, nares w/o erythema or drainage Eyes: Conjunctiva clear. Sclera non-icteric Neck: Supple.  Trachea midline Pulmonary:  Good air movement, no use of accessory muscles.  Cardiac: RRR, no JVD Vascular: Good thrill in right upper arm AV fistula Vessel Right Left  Radial Palpable Palpable                          PT Not palpable 1+ palpable  DP Not palpable 1+ palpable   Gastrointestinal: soft,  non-tender/non-distended. No guarding/reflex.  Musculoskeletal: M/S 5/5 throughout.  Right BKA with prosthesis in place.  No significant left lower extremity edema Neurologic: Sensation grossly intact in extremities.  Symmetrical.  Speech is fluent.  Psychiatric: Judgment intact, Mood & affect appropriate for pt's clinical situation. Dermatologic: No rashes or ulcers noted.  No cellulitis or open wounds.      Labs No results found for this or any previous visit (from the past 2160 hour(s)).  Radiology MM 3D SCREEN BREAST BILATERAL  Result Date: 12/13/2020 CLINICAL DATA:  Screening. EXAM: DIGITAL SCREENING BILATERAL MAMMOGRAM WITH TOMOSYNTHESIS AND CAD TECHNIQUE: Bilateral screening digital craniocaudal and mediolateral oblique mammograms were obtained. Bilateral screening digital breast tomosynthesis was performed. The images were evaluated with computer-aided detection. COMPARISON:  Previous exam(s). ACR Breast Density Category c: The breast tissue is heterogeneously dense, which may obscure small masses. FINDINGS: There are no findings suspicious for malignancy. IMPRESSION: No mammographic evidence of malignancy. A result letter of this screening mammogram will be mailed directly to the patient. RECOMMENDATION: Screening mammogram in one year. (Code:SM-B-01Y) BI-RADS CATEGORY  1: Negative. Electronically Signed   By: Everlean Alstrom M.D.   On: 12/13/2020 16:34   VAS [place] ABI WITH/WO TBI  Result Date: 12/26/2020  LOWER EXTREMITY DOPPLER STUDY Patient Name:  NAOMII KREGER  Date of Exam:   12/26/2020 Medical Rec #: [id number]        Accession #:    [id number] Date of Birth: [birth year]        Patient Gender: F Patient Age:   81Y Exam Location:  [facility] Procedure:      VAS [place] ABI WITH/WO TBI Referring Phys: [id number] [place] --------------------------------------------------------------------------------  Indications: Peripheral artery disease, and right BKA.  Comparison Study:  12/29/2019 Performing Technologist: Almira Coaster RVS  Examination Guidelines: A complete evaluation includes at minimum, Doppler waveform signals and systolic blood pressure reading at the level of bilateral brachial, anterior tibial, and posterior tibial arteries, when vessel segments are accessible. Bilateral testing is considered an integral part of a complete examination. Photoelectric Plethysmograph (PPG) waveforms and toe systolic pressure readings are included as required and additional duplex testing as needed. Limited examinations for reoccurring indications may be performed as noted.  ABI Findings: +--------+------------------+-----+--------+--------+ Right   Rt Pressure (mmHg)IndexWaveformComment  +--------+------------------+-----+--------+--------+ Brachial                               HDA      +--------+------------------+-----+--------+--------+ +---------+------------------+-----+--------+-------+ Left     Lt Pressure (mmHg)IndexWaveformComment +---------+------------------+-----+--------+-------+ Brachial 214                                    +---------+------------------+-----+--------+-------+ ATA      250               1.17                 +---------+------------------+-----+--------+-------+ PTA      107               0.50                 +---------+------------------+-----+--------+-------+ Great Toe64                0.30 Abnormal        +---------+------------------+-----+--------+-------+ +-------+-----------+-----------+------------+------------+ ABI/TBIToday's ABIToday's TBIPrevious ABIPrevious TBI +-------+-----------+-----------+------------+------------+ Right  BKA                   BKA                      +-------+-----------+-----------+------------+------------+  Left   >1.0 [place]    .30        1.10        .25          +-------+-----------+-----------+------------+------------+ Bilateral ABIs appear essentially unchanged  compared to prior study on 12/29/2019. Left TBIs appear increased compared to prior study on 12/29/2019.  Summary: Right: Rt BKA. Left: Resting left ankle-brachial index indicates noncompressible left lower extremity arteries. The left toe-brachial index is abnormal.  *See table(s) above for measurements and observations.  Electronically signed by Leotis Pain MD on 12/26/2020 at 5:13:44 PM.    Final    VAS [place] (AVF, AVG)  Result Date: 12/26/2020 DIALYSIS ACCESS Patient Name:  ASIYAH PINEAU  Date of Exam:   12/26/2020 Medical Rec #: [id number]        Accession #:    [id number] Date of Birth: 08-20-1970        Patient Gender: F Patient Age:   65Y Exam Location:  [facility] Procedure:      VAS US DUPLEX DIALYSIS ACCESS (AVF, AVG) Referring Phys: [id number] [place] --------------------------------------------------------------------------------  Access Site: Right Upper Extremity. Access Type: Brachial-cephalic AVF. History: AVF has not been used since 2014. Patient received new kidney in 2014.          Right brachial-cephalic AVF; 11/02/82-ZYSA bypass graft; 04/19/13-DRIL          stent & brachial artery PTA; 07/14/13-Proximal & distal DRIL anastomosis          PTAs. Comparison Study: 12/29/2019 Performing Technologist: Almira Coaster RVS  Examination Guidelines: A complete evaluation includes B-mode imaging, spectral Doppler, color Doppler, and power Doppler as needed of all accessible portions of each vessel. Unilateral testing is considered an integral part of a complete examination. Limited examinations for reoccurring indications may be performed as noted.  Findings: +--------------------+----------+-----------------+--------+ AVF                 PSV (cm/s)Flow Vol (mL/min)Comments +--------------------+----------+-----------------+--------+ Native artery inflow   387          1051                +--------------------+----------+-----------------+--------+ AVF  Anastomosis        574                              +--------------------+----------+-----------------+--------+  +---------------+----------+-------------+----------+--------+ OUTFLOW VEIN   PSV (cm/s)Diameter (cm)Depth (cm)Describe +---------------+----------+-------------+----------+--------+ Subclavian vein   251                                    +---------------+----------+-------------+----------+--------+ Confluence        260                                    +---------------+----------+-------------+----------+--------+ Shoulder           84                                    +---------------+----------+-------------+----------+--------+ Prox UA            82                                    +---------------+----------+-------------+----------+--------+  Mid UA             68                                    +---------------+----------+-------------+----------+--------+ Dist UA           315                                    +---------------+----------+-------------+----------+--------+  +--------------+-------------+---------+---------+----------+------------------+               Diameter (cm)  Depth  BranchingPSV (cm/s)   Flow Volume                                  (cm)                           (ml/min)      +--------------+-------------+---------+---------+----------+------------------+ Rt Rad Art                                       60                       Dist                                                                      +--------------+-------------+---------+---------+----------+------------------+  Summary: Patient has a Transplanted Kidney and is no longer on Dialysis. The Right Brachial Cephalic AVF appears to be patent throughout; Flow Volume appears to be normal.  *See table(s) above for measurements and observations.  Diagnosing physician: Leotis Pain MD Electronically signed by Leotis Pain MD on 12/26/2020 at  5:13:38 PM.   --------------------------------------------------------------------------------   Final    PCV CAROTID DUPLEX (BILATERAL)  Result Date: 12/23/2020 Carotid artery duplex 12/20/2020: Duplex suggests stenosis in the right internal carotid artery (50-69%). Duplex suggests stenosis in the right external carotid artery (<50%). Duplex suggests stenosis in the left internal carotid artery (1-15%). Duplex suggests stenosis in the left external carotid artery (<50%). Antegrade right vertebral artery flow. Antegrade left vertebral artery flow. Follow up in six months is appropriate if clinically indicated.   Assessment/Plan History of renal transplant Currently working well.     S/P unilateral BKA (below knee amputation) ([place]) On the right. Well-healed. Increases her concern over her left lower extremity ischemic symptoms.   End stage renal disease Status post renal transplant.   Type 2 diabetes mellitus with peripheral neuropathy (HCC) blood glucose control important in reducing the progression of atherosclerotic disease. Also, involved in wound healing. On appropriate medications.     HTN (hypertension), malignant blood pressure control important in reducing the progression of atherosclerotic disease. On appropriate oral medications.  Steal syndrome as complication of dialysis access [facility]) Duplex shows a patent right arm AV fistula without any hemodynamically significant stenosis identified.  Her flow to her right hand has been improved with previous surgery.  She lost  a finger on that hand many years ago, but has not had any further issues recently.  We are leaving the fistula intact in case her transplant fails but if she does develop recurrent ischemic symptoms or other issues this may have to be ligated.  Recheck in 1 year.  PAD (peripheral artery disease) (HCC) Left ABI is noncompressible.  Left great toe pressure is 64. No worrisome symptoms. Recheck in one year.     Leotis Pain, MD  12/27/2020 8:06 AM    This note was created with Dragon medical transcription system.  Any errors from dictation are purely unintentional

## 2020-12-27 DIAGNOSIS — R809 Proteinuria, unspecified: Secondary | ICD-10-CM | POA: Diagnosis not present

## 2020-12-27 DIAGNOSIS — E1165 Type 2 diabetes mellitus with hyperglycemia: Secondary | ICD-10-CM | POA: Diagnosis not present

## 2020-12-27 DIAGNOSIS — Z940 Kidney transplant status: Secondary | ICD-10-CM | POA: Diagnosis not present

## 2020-12-27 DIAGNOSIS — I10 Essential (primary) hypertension: Secondary | ICD-10-CM | POA: Diagnosis not present

## 2020-12-27 NOTE — Assessment & Plan Note (Signed)
Left ABI is noncompressible.  Left great toe pressure is 64. No worrisome symptoms. Recheck in one year.

## 2020-12-27 NOTE — Assessment & Plan Note (Signed)
Duplex shows a patent right arm AV fistula without any hemodynamically significant stenosis identified.  Her flow to her right hand has been improved with previous surgery.  She lost a finger on that hand many years ago, but has not had any further issues recently.  We are leaving the fistula intact in case her transplant fails but if she does develop recurrent ischemic symptoms or other issues this may have to be ligated.  Recheck in 1 year.

## 2020-12-28 NOTE — Progress Notes (Signed)
Carotid artery duplex 12/20/2020: Duplex suggests stenosis in the right internal carotid artery (50-69%). Duplex suggests stenosis in the right external carotid artery (<50%). Duplex suggests stenosis in the left internal carotid artery (1-15%). Duplex suggests stenosis in the left external carotid artery (<50%). Antegrade right vertebral artery flow. Antegrade left vertebral artery flow. Follow up in six months is appropriate if clinically indicated.

## 2021-01-02 DIAGNOSIS — J4540 Moderate persistent asthma, uncomplicated: Secondary | ICD-10-CM | POA: Diagnosis not present

## 2021-01-02 DIAGNOSIS — K219 Gastro-esophageal reflux disease without esophagitis: Secondary | ICD-10-CM | POA: Diagnosis not present

## 2021-01-02 DIAGNOSIS — E1165 Type 2 diabetes mellitus with hyperglycemia: Secondary | ICD-10-CM | POA: Diagnosis not present

## 2021-01-02 DIAGNOSIS — Z0000 Encounter for general adult medical examination without abnormal findings: Secondary | ICD-10-CM | POA: Diagnosis not present

## 2021-01-02 DIAGNOSIS — I10 Essential (primary) hypertension: Secondary | ICD-10-CM | POA: Diagnosis not present

## 2021-01-02 DIAGNOSIS — E782 Mixed hyperlipidemia: Secondary | ICD-10-CM | POA: Diagnosis not present

## 2021-01-02 DIAGNOSIS — Z794 Long term (current) use of insulin: Secondary | ICD-10-CM | POA: Diagnosis not present

## 2021-01-02 DIAGNOSIS — J302 Other seasonal allergic rhinitis: Secondary | ICD-10-CM | POA: Diagnosis not present

## 2021-01-22 DIAGNOSIS — N182 Chronic kidney disease, stage 2 (mild): Secondary | ICD-10-CM | POA: Diagnosis not present

## 2021-01-22 DIAGNOSIS — N2581 Secondary hyperparathyroidism of renal origin: Secondary | ICD-10-CM | POA: Diagnosis not present

## 2021-01-22 DIAGNOSIS — E1165 Type 2 diabetes mellitus with hyperglycemia: Secondary | ICD-10-CM | POA: Diagnosis not present

## 2021-01-22 DIAGNOSIS — D631 Anemia in chronic kidney disease: Secondary | ICD-10-CM | POA: Diagnosis not present

## 2021-01-22 DIAGNOSIS — Z794 Long term (current) use of insulin: Secondary | ICD-10-CM | POA: Diagnosis not present

## 2021-01-22 DIAGNOSIS — Z940 Kidney transplant status: Secondary | ICD-10-CM | POA: Diagnosis not present

## 2021-01-22 DIAGNOSIS — I129 Hypertensive chronic kidney disease with stage 1 through stage 4 chronic kidney disease, or unspecified chronic kidney disease: Secondary | ICD-10-CM | POA: Diagnosis not present

## 2021-01-23 DIAGNOSIS — Z130 Encounter for screening for diseases of the blood and blood-forming organs and certain disorders involving the immune mechanism: Secondary | ICD-10-CM | POA: Diagnosis not present

## 2021-01-23 DIAGNOSIS — Z1211 Encounter for screening for malignant neoplasm of colon: Secondary | ICD-10-CM | POA: Diagnosis not present

## 2021-01-23 DIAGNOSIS — Z9713 Presence of artificial right leg (complete) (partial): Secondary | ICD-10-CM | POA: Diagnosis not present

## 2021-01-23 DIAGNOSIS — Z1389 Encounter for screening for other disorder: Secondary | ICD-10-CM | POA: Diagnosis not present

## 2021-01-23 DIAGNOSIS — Z940 Kidney transplant status: Secondary | ICD-10-CM | POA: Diagnosis not present

## 2021-02-01 DIAGNOSIS — E1165 Type 2 diabetes mellitus with hyperglycemia: Secondary | ICD-10-CM | POA: Diagnosis not present

## 2021-02-01 DIAGNOSIS — D72819 Decreased white blood cell count, unspecified: Secondary | ICD-10-CM | POA: Diagnosis not present

## 2021-02-01 DIAGNOSIS — Z4822 Encounter for aftercare following kidney transplant: Secondary | ICD-10-CM | POA: Diagnosis not present

## 2021-02-01 DIAGNOSIS — E785 Hyperlipidemia, unspecified: Secondary | ICD-10-CM | POA: Diagnosis not present

## 2021-02-01 DIAGNOSIS — Z9641 Presence of insulin pump (external) (internal): Secondary | ICD-10-CM | POA: Diagnosis not present

## 2021-02-01 DIAGNOSIS — Z940 Kidney transplant status: Secondary | ICD-10-CM | POA: Diagnosis not present

## 2021-02-01 DIAGNOSIS — Z79899 Other long term (current) drug therapy: Secondary | ICD-10-CM | POA: Diagnosis not present

## 2021-02-01 DIAGNOSIS — D649 Anemia, unspecified: Secondary | ICD-10-CM | POA: Diagnosis not present

## 2021-02-01 DIAGNOSIS — I10 Essential (primary) hypertension: Secondary | ICD-10-CM | POA: Diagnosis not present

## 2021-02-01 DIAGNOSIS — E109 Type 1 diabetes mellitus without complications: Secondary | ICD-10-CM | POA: Diagnosis not present

## 2021-02-01 DIAGNOSIS — Z7952 Long term (current) use of systemic steroids: Secondary | ICD-10-CM | POA: Diagnosis not present

## 2021-02-01 DIAGNOSIS — D849 Immunodeficiency, unspecified: Secondary | ICD-10-CM | POA: Diagnosis not present

## 2021-02-01 DIAGNOSIS — Z792 Long term (current) use of antibiotics: Secondary | ICD-10-CM | POA: Diagnosis not present

## 2021-02-01 DIAGNOSIS — Z794 Long term (current) use of insulin: Secondary | ICD-10-CM | POA: Diagnosis not present

## 2021-02-07 ENCOUNTER — Encounter: Payer: Self-pay | Admitting: Gastroenterology

## 2021-02-14 ENCOUNTER — Telehealth: Payer: Self-pay | Admitting: *Deleted

## 2021-02-14 NOTE — Telephone Encounter (Signed)
Please contact patient's dr for insulin pump instructions before her upcoming colonoscopy. Thanks!

## 2021-02-15 ENCOUNTER — Ambulatory Visit (AMBULATORY_SURGERY_CENTER): Payer: Medicare Other | Admitting: *Deleted

## 2021-02-15 VITALS — Ht 60.0 in | Wt 176.0 lb

## 2021-02-15 DIAGNOSIS — Z1211 Encounter for screening for malignant neoplasm of colon: Secondary | ICD-10-CM

## 2021-02-15 MED ORDER — NA SULFATE-K SULFATE-MG SULF 17.5-3.13-1.6 GM/177ML PO SOLN: 1.0000 | Freq: Once | ORAL | 0 refills | Status: AC

## 2021-02-15 NOTE — Progress Notes (Signed)
Pt verified name, DOB, address and insurance during PV today.  Pt mailed instruction packet of Emmi video, copy of consent form to read and not return, and instructions. PV completed over the phone.  Pt encouraged to call with questions or issues.  My Chart instructions to pt as well   PT HAS A BELOW THE KNEE AMPUTATION- SHE AMBULATES WITH THE PROSTHESIS , NO CANE OR WALKER  No egg or soy allergy known to patient  No issues with past sedation with any surgeries or procedures Patient denies ever being told they had issues or difficulty with intubation  No FH of Malignant Hyperthermia No diet pills per patient No home 02 use per patient  No blood thinners per patient  Pt denies issues with constipation  No A fib or A flutter  EMMI video to pt or via [place] 19 guidelines implemented in PV today with Pt and RN   Pt is fully vaccinated  for Covid   Due to the COVID-19 pandemic we are asking patients to follow certain guidelines.  Pt aware of COVID protocols and [facility] guidelines   INSULIN PUMP- AT PV WE DO NOT HAVE PUMP INSTRUCTIONS- PT ADVISED DR JACOBS CMA WILL CALL HER WITH THESE INSTRUCTIONS PRIOR TO COLON

## 2021-02-15 NOTE — Telephone Encounter (Signed)
PT SEES DR Chalmers Cater FOR ENDOCRINOLOGY / INSULIN PUMP

## 2021-02-16 ENCOUNTER — Encounter (HOSPITAL_COMMUNITY): Payer: Self-pay | Admitting: *Deleted

## 2021-02-16 NOTE — Telephone Encounter (Signed)
Insulin pump letter faxed to [phone]. Will wait for response.

## 2021-02-20 DIAGNOSIS — E1165 Type 2 diabetes mellitus with hyperglycemia: Secondary | ICD-10-CM | POA: Diagnosis not present

## 2021-02-20 DIAGNOSIS — Z9641 Presence of insulin pump (external) (internal): Secondary | ICD-10-CM | POA: Diagnosis not present

## 2021-02-20 DIAGNOSIS — I10 Essential (primary) hypertension: Secondary | ICD-10-CM | POA: Diagnosis not present

## 2021-02-20 DIAGNOSIS — Z940 Kidney transplant status: Secondary | ICD-10-CM | POA: Diagnosis not present

## 2021-02-22 DIAGNOSIS — E1165 Type 2 diabetes mellitus with hyperglycemia: Secondary | ICD-10-CM | POA: Diagnosis not present

## 2021-02-22 DIAGNOSIS — Z794 Long term (current) use of insulin: Secondary | ICD-10-CM | POA: Diagnosis not present

## 2021-02-22 DIAGNOSIS — D225 Melanocytic nevi of trunk: Secondary | ICD-10-CM | POA: Diagnosis not present

## 2021-02-22 DIAGNOSIS — Z1283 Encounter for screening for malignant neoplasm of skin: Secondary | ICD-10-CM | POA: Diagnosis not present

## 2021-02-22 NOTE — Telephone Encounter (Signed)
Received insulin pump instructions from Dr Chalmers Cater by fax.  Phone call to patient to make sure she had been given instructions by Dr Almetta Lovely office.  Patient states she was given instructions to start 50% temp basal one hour pre procedure and til she restarts eating food by mouth at office with with Dr Chalmers Cater on 02-21-2021.  Patient agreed to plan and verbalized understanding. No further questions.

## 2021-02-27 ENCOUNTER — Encounter: Payer: Self-pay | Admitting: Certified Registered Nurse Anesthetist

## 2021-02-28 ENCOUNTER — Ambulatory Visit (AMBULATORY_SURGERY_CENTER): Payer: Medicare Other | Admitting: Gastroenterology

## 2021-02-28 ENCOUNTER — Encounter: Payer: Self-pay | Admitting: Gastroenterology

## 2021-02-28 VITALS — BP 190/78 | HR 100 | Temp 98.0°F | Resp 12 | Ht 60.0 in | Wt 176.0 lb

## 2021-02-28 DIAGNOSIS — Z1211 Encounter for screening for malignant neoplasm of colon: Secondary | ICD-10-CM | POA: Diagnosis not present

## 2021-02-28 MED ORDER — SODIUM CHLORIDE 0.9 % IV SOLN: 500.0000 mL | Freq: Once | INTRAVENOUS | Status: DC

## 2021-02-28 NOTE — Progress Notes (Signed)
Report given to PACU, vss 

## 2021-02-28 NOTE — Patient Instructions (Signed)
YOU HAD AN ENDOSCOPIC PROCEDURE TODAY AT THE [facility]:   Refer to the procedure report that was given to you for any specific questions about what was found during the examination.  If the procedure report does not answer your questions, please call your gastroenterologist to clarify.  If you requested that your care partner not be given the details of your procedure findings, then the procedure report has been included in a sealed envelope for you to review at your convenience later.  YOU SHOULD EXPECT: Some feelings of bloating in the abdomen. Passage of more gas than usual.  Walking can help get rid of the air that was put into your GI tract during the procedure and reduce the bloating. If you had a lower endoscopy (such as a colonoscopy or flexible sigmoidoscopy) you may notice spotting of blood in your stool or on the toilet paper. If you underwent a bowel prep for your procedure, you may not have a normal bowel movement for a few days.  Please Note:  You might notice some irritation and congestion in your nose or some drainage.  This is from the oxygen used during your procedure.  There is no need for concern and it should clear up in a day or so.  SYMPTOMS TO REPORT IMMEDIATELY:   Following lower endoscopy (colonoscopy or flexible sigmoidoscopy):  Excessive amounts of blood in the stool  Significant tenderness or worsening of abdominal pains  Swelling of the abdomen that is new, acute  Fever of 100F or higher  For urgent or emergent issues, a gastroenterologist can be reached at any hour by calling [phone]. Do not use MyChart messaging for urgent concerns.    DIET:  We do recommend a small meal at first, but then you may proceed to your regular diet.  Drink plenty of fluids but you should avoid alcoholic beverages for 24 hours.  ACTIVITY:  You should plan to take it easy for the rest of today and you should NOT DRIVE or use heavy machinery until tomorrow (because  of the sedation medicines used during the test).    FOLLOW UP: Our staff will call the number listed on your records 48-72 hours following your procedure to check on you and address any questions or concerns that you may have regarding the information given to you following your procedure. If we do not reach you, we will leave a message.  We will attempt to reach you two times.  During this call, we will ask if you have developed any symptoms of COVID 19. If you develop any symptoms (ie: fever, flu-like symptoms, shortness of breath, cough etc.) before then, please call [phone].  If you test positive for Covid 19 in the 2 weeks post procedure, please call and report this information to us.    If any biopsies were taken you will be contacted by phone or by letter within the next 1-3 weeks.  Please call us at [phone] if you have not heard about the biopsies in 3 weeks.    SIGNATURES/CONFIDENTIALITY: You and/or your care partner have signed paperwork which will be entered into your electronic medical record.  These signatures attest to the fact that that the information above on your After Visit Summary has been reviewed and is understood.  Full responsibility of the confidentiality of this discharge information lies with you and/or your care-partner. 

## 2021-02-28 NOTE — Progress Notes (Signed)
Pt's states no medical or surgical changes since previsit or office visit.   Check-in-ss V/s-Ronks

## 2021-02-28 NOTE — Progress Notes (Signed)
1015 Ephedrine 10 mg given IV due to low BP, MD updated.   °

## 2021-02-28 NOTE — Op Note (Signed)
[place] Patient Name: [name] Procedure Date: 02/28/2021 9:49 AM MRN: [id number] Endoscopist: Milus Banister , MD Age: 50 Referring MD:  Date of Birth: [birth year] Gender: Female Account #: [id number] Procedure:                Colonoscopy Indications:              Screening for colorectal malignant neoplasm Medicines:                Monitored Anesthesia Care Procedure:                Pre-Anesthesia Assessment:                           - Prior to the procedure, a History and Physical                            was performed, and patient medications and                            allergies were reviewed. The patient's tolerance of                            previous anesthesia was also reviewed. The risks                            and benefits of the procedure and the sedation                            options and risks were discussed with the patient.                            All questions were answered, and informed consent                            was obtained. Prior Anticoagulants: The patient has                            taken no previous anticoagulant or antiplatelet                            agents. ASA Grade Assessment: III - A patient with                            severe systemic disease. After reviewing the risks                            and benefits, the patient was deemed in                            satisfactory condition to undergo the procedure.                           After obtaining informed consent, the colonoscope  was passed under direct vision. Throughout the                            procedure, the patient's blood pressure, pulse, and                            oxygen saturations were monitored continuously. The                            CF [id number] #[id number] was introduced through the anus                            and advanced to the the cecum, identified by                            appendiceal  orifice and ileocecal valve. The                            colonoscopy was performed without difficulty. The                            patient tolerated the procedure well. The quality                            of the bowel preparation was good. The ileocecal                            valve, appendiceal orifice, and rectum were                            photographed. Scope In: 10:10:57 AM Scope Out: 10:21:08 AM Scope Withdrawal Time: 0 hours 6 minutes 41 seconds  Total Procedure Duration: 0 hours 10 minutes 11 seconds  Findings:                 The entire examined colon appeared normal on direct                            views (unable to retroflex in the rectum due to                            small rectal vault). Complications:            No immediate complications. Estimated blood loss:                            None. Estimated Blood Loss:     Estimated blood loss: none. Impression:               - The entire examined colon is normal on direct                            views.                           - No polyps or cancers.  Recommendation:           - Patient has a contact number available for                            emergencies. The signs and symptoms of potential                            delayed complications were discussed with the                            patient. Return to normal activities tomorrow.                            Written discharge instructions were provided to the                            patient.                           - Resume previous diet.                           - Continue present medications.                           - Repeat colonoscopy in 10 years for screening. Milus Banister, MD 02/28/2021 10:28:51 AM This report has been signed electronically.

## 2021-03-01 ENCOUNTER — Encounter: Payer: Medicare Other | Admitting: Gastroenterology

## 2021-03-01 DIAGNOSIS — Z794 Long term (current) use of insulin: Secondary | ICD-10-CM | POA: Diagnosis not present

## 2021-03-01 DIAGNOSIS — E1165 Type 2 diabetes mellitus with hyperglycemia: Secondary | ICD-10-CM | POA: Diagnosis not present

## 2021-03-02 ENCOUNTER — Telehealth: Payer: Self-pay | Admitting: *Deleted

## 2021-03-02 DIAGNOSIS — I10 Essential (primary) hypertension: Secondary | ICD-10-CM | POA: Diagnosis not present

## 2021-03-02 DIAGNOSIS — K219 Gastro-esophageal reflux disease without esophagitis: Secondary | ICD-10-CM | POA: Diagnosis not present

## 2021-03-02 DIAGNOSIS — R110 Nausea: Secondary | ICD-10-CM | POA: Diagnosis not present

## 2021-03-02 DIAGNOSIS — E782 Mixed hyperlipidemia: Secondary | ICD-10-CM | POA: Diagnosis not present

## 2021-03-02 DIAGNOSIS — Z794 Long term (current) use of insulin: Secondary | ICD-10-CM | POA: Diagnosis not present

## 2021-03-02 DIAGNOSIS — J0100 Acute maxillary sinusitis, unspecified: Secondary | ICD-10-CM | POA: Diagnosis not present

## 2021-03-02 DIAGNOSIS — E1165 Type 2 diabetes mellitus with hyperglycemia: Secondary | ICD-10-CM | POA: Diagnosis not present

## 2021-03-02 DIAGNOSIS — J302 Other seasonal allergic rhinitis: Secondary | ICD-10-CM | POA: Diagnosis not present

## 2021-03-02 DIAGNOSIS — J4540 Moderate persistent asthma, uncomplicated: Secondary | ICD-10-CM | POA: Diagnosis not present

## 2021-03-02 NOTE — Telephone Encounter (Signed)
  Follow up Call-  Call back number 02/28/2021  Post procedure Call Back phone  # [phone]  Permission to leave phone message Yes  Some recent data might be hidden     Patient questions:  Do you have a fever, pain , or abdominal swelling? No. Pain Score  0 *  Have you tolerated food without any problems? No. PT has a sinus infection just saw her PCP today. She has been having nausea related to the drainage.  Have you been able to return to your normal activities? Yes.    Do you have any questions about your discharge instructions: Diet   No. Medications  No. Follow up visit  No.  Do you have questions or concerns about your Care? No.  Actions: * If pain score is 4 or above: No action needed, pain <4.  Have you developed a fever since your procedure? no  2.   Have you had an respiratory symptoms (SOB or cough) since your procedure? no  3.   Have you tested positive for COVID 19 since your procedure no  4.   Have you had any family members/close contacts diagnosed with the COVID 19 since your procedure?  no   If yes to any of these questions please route to Joylene John, RN and Joella Prince, RN

## 2021-03-02 NOTE — Telephone Encounter (Signed)
Inbound call from patient. Had procedure 9/14. States she is still unable to keep food down.

## 2021-03-02 NOTE — Telephone Encounter (Signed)
Left VM no answer for post procedure follow up call.

## 2021-03-08 DIAGNOSIS — E782 Mixed hyperlipidemia: Secondary | ICD-10-CM | POA: Diagnosis not present

## 2021-03-08 DIAGNOSIS — Z20822 Contact with and (suspected) exposure to covid-19: Secondary | ICD-10-CM | POA: Diagnosis not present

## 2021-03-08 DIAGNOSIS — J4540 Moderate persistent asthma, uncomplicated: Secondary | ICD-10-CM | POA: Diagnosis not present

## 2021-03-08 DIAGNOSIS — E1165 Type 2 diabetes mellitus with hyperglycemia: Secondary | ICD-10-CM | POA: Diagnosis not present

## 2021-03-08 DIAGNOSIS — R110 Nausea: Secondary | ICD-10-CM | POA: Diagnosis not present

## 2021-03-08 DIAGNOSIS — I10 Essential (primary) hypertension: Secondary | ICD-10-CM | POA: Diagnosis not present

## 2021-03-08 DIAGNOSIS — Z23 Encounter for immunization: Secondary | ICD-10-CM | POA: Diagnosis not present

## 2021-03-08 DIAGNOSIS — Z794 Long term (current) use of insulin: Secondary | ICD-10-CM | POA: Diagnosis not present

## 2021-03-08 DIAGNOSIS — J302 Other seasonal allergic rhinitis: Secondary | ICD-10-CM | POA: Diagnosis not present

## 2021-03-08 DIAGNOSIS — K219 Gastro-esophageal reflux disease without esophagitis: Secondary | ICD-10-CM | POA: Diagnosis not present

## 2021-03-14 DIAGNOSIS — Z89511 Acquired absence of right leg below knee: Secondary | ICD-10-CM | POA: Diagnosis not present

## 2021-03-14 DIAGNOSIS — B351 Tinea unguium: Secondary | ICD-10-CM | POA: Diagnosis not present

## 2021-03-14 DIAGNOSIS — M2042 Other hammer toe(s) (acquired), left foot: Secondary | ICD-10-CM | POA: Diagnosis not present

## 2021-03-14 DIAGNOSIS — I739 Peripheral vascular disease, unspecified: Secondary | ICD-10-CM | POA: Diagnosis not present

## 2021-03-14 DIAGNOSIS — L84 Corns and callosities: Secondary | ICD-10-CM | POA: Diagnosis not present

## 2021-03-20 DIAGNOSIS — E1165 Type 2 diabetes mellitus with hyperglycemia: Secondary | ICD-10-CM | POA: Diagnosis not present

## 2021-03-20 DIAGNOSIS — Z9641 Presence of insulin pump (external) (internal): Secondary | ICD-10-CM | POA: Diagnosis not present

## 2021-04-14 ENCOUNTER — Other Ambulatory Visit: Payer: Self-pay | Admitting: Cardiology

## 2021-04-27 ENCOUNTER — Telehealth (INDEPENDENT_AMBULATORY_CARE_PROVIDER_SITE_OTHER): Payer: Self-pay

## 2021-04-27 NOTE — Telephone Encounter (Signed)
Patient called in stating she needed an appt asap. Patient fell last night where her fistula is placed and states that it is pain and puffy swelling in the area. Spoke with nurse and ultra sound lead that it was ok to bring patient in on Monday

## 2021-04-30 ENCOUNTER — Other Ambulatory Visit (INDEPENDENT_AMBULATORY_CARE_PROVIDER_SITE_OTHER): Payer: Self-pay | Admitting: Nurse Practitioner

## 2021-04-30 ENCOUNTER — Other Ambulatory Visit: Payer: Self-pay

## 2021-04-30 ENCOUNTER — Encounter (INDEPENDENT_AMBULATORY_CARE_PROVIDER_SITE_OTHER): Payer: Self-pay | Admitting: Nurse Practitioner

## 2021-04-30 ENCOUNTER — Ambulatory Visit (INDEPENDENT_AMBULATORY_CARE_PROVIDER_SITE_OTHER): Payer: Medicare Other | Admitting: Nurse Practitioner

## 2021-04-30 ENCOUNTER — Ambulatory Visit (INDEPENDENT_AMBULATORY_CARE_PROVIDER_SITE_OTHER): Payer: Medicare Other

## 2021-04-30 VITALS — BP 171/85 | HR 82 | Resp 16 | Wt 176.0 lb

## 2021-04-30 DIAGNOSIS — M7989 Other specified soft tissue disorders: Secondary | ICD-10-CM

## 2021-04-30 DIAGNOSIS — I10 Essential (primary) hypertension: Secondary | ICD-10-CM | POA: Diagnosis not present

## 2021-04-30 DIAGNOSIS — N186 End stage renal disease: Secondary | ICD-10-CM

## 2021-04-30 DIAGNOSIS — E1142 Type 2 diabetes mellitus with diabetic polyneuropathy: Secondary | ICD-10-CM

## 2021-05-01 DIAGNOSIS — E0840 Diabetes mellitus due to underlying condition with diabetic neuropathy, unspecified: Secondary | ICD-10-CM | POA: Diagnosis not present

## 2021-05-01 DIAGNOSIS — I10 Essential (primary) hypertension: Secondary | ICD-10-CM | POA: Diagnosis not present

## 2021-05-01 DIAGNOSIS — Z9641 Presence of insulin pump (external) (internal): Secondary | ICD-10-CM | POA: Diagnosis not present

## 2021-05-01 DIAGNOSIS — E1165 Type 2 diabetes mellitus with hyperglycemia: Secondary | ICD-10-CM | POA: Diagnosis not present

## 2021-05-06 ENCOUNTER — Encounter (INDEPENDENT_AMBULATORY_CARE_PROVIDER_SITE_OTHER): Payer: Self-pay | Admitting: Nurse Practitioner

## 2021-05-06 NOTE — Progress Notes (Signed)
Subjective:    Patient ID: [name], female    DOB: 1971-01-31, 50 y.o.   MRN: [id number] Chief Complaint  Patient presents with   Follow-up    Checking fistula    [name] is a 50 year old female that presents today after a fall on her right arm.  The patient lost her balance and fell and was concerned due to swelling in her right upper extremity.  Currently the patient has a kidney transplant she no longer uses her dialysis access but she currently has a right brachiocephalic AV fistula in her right arm was concerned about the fistula itself.  Today the swelling has decreased but some still remains.  Today a limited evaluation of her access was done.  It is patent throughout with no evidence of stenosis.  Limited evaluation does show flow volume of 726.  Patient is also previously had a DRIL procedure   Review of Systems  Neurological:  Positive for weakness.  All other systems reviewed and are negative.     Objective:   Physical Exam Vitals reviewed.  HENT:     Head: Normocephalic.  Cardiovascular:     Rate and Rhythm: Normal rate.     Pulses: Normal pulses.          Radial pulses are 2+ on the right side.     Arteriovenous access: Right arteriovenous access is present.    Comments: Right good thrill and bruit Pulmonary:     Effort: Pulmonary effort is normal.  Musculoskeletal:     Right Lower Extremity: Right leg is amputated below knee.  Neurological:     Mental Status: She is alert and oriented to person, place, and time.     Gait: Gait abnormal.  Psychiatric:        Mood and Affect: Mood normal.        Behavior: Behavior normal.        Thought Content: Thought content normal.        Judgment: Judgment normal.    BP (!) 171/85 (BP Location: Left Arm)   Pulse 82   Resp 16   Wt 176 lb (79.8 kg)   BMI 34.37 kg/m   Past Medical History:  Diagnosis Date   Anemia    Below knee amputation (HCC)    RIGHT   CHF (congestive heart failure) (HCC)    PT  UNAWARE OF THIS DX   Chronic kidney disease    PAST HX OF ESRD- HAD KIDNEY TRANSPLANT 01-24-2013   Critical lower limb ischemia (HCC) 10/05/2015   Enlarged heart    WITH IRREGULAR HEART RATE   GERD (gastroesophageal reflux disease)    High cholesterol    History of blood transfusion    related to "kidney transplant"   Hypercholesteremia 08/14/2006   Qualifier: Diagnosis of  By: Eusebio Friendly     Hypertension    Mechanical complication of other vascular device, implant, and graft 49/67/5916   Metabolic bone disease    PAD (peripheral artery disease) ([place])    PT UNAWARE OF THIS DIAGNOSIS   Pericardial effusion    Retinopathy    Type II diabetes mellitus ([place])    "controlled with diet"    Social History   Socioeconomic History   Marital status: Single    Spouse name: Not on file   Number of children: 0   Years of education: Not on file   Highest education level: Not on file  Occupational History   Not on file  Tobacco Use   Smoking status: Never   Smokeless tobacco: Never  Vaping Use   Vaping Use: Never used  Substance and Sexual Activity   Alcohol use: No   Drug use: No   Sexual activity: Never  Other Topics Concern   Not on file  Social History Narrative   Not on file   Social Determinants of Health   Financial Resource Strain: Not on file  Food Insecurity: Not on file  Transportation Needs: Not on file  Physical Activity: Not on file  Stress: Not on file  Social Connections: Not on file  Intimate Partner Violence: Not on file    Past Surgical History:  Procedure Laterality Date   AMPUTATION Right 10/13/2015   Procedure: Right Transmetatarsal Amputation;  Surgeon: Newt Minion, MD;  Location: [facility];  Service: Orthopedics;  Laterality: Right;   AMPUTATION Right 11/29/2015   Procedure: RIGHT BELOW KNEE AMPUTATION;  Surgeon: Newt Minion, MD;  Location: [facility];  Service: Orthopedics;  Laterality: Right;   AMPUTATION FINGER     Rt hand middle finger    AV FISTULA PLACEMENT  10/04/2011   Procedure: INSERTION OF ARTERIOVENOUS (AV) GORE-TEX GRAFT ARM;  Surgeon: Angelia Mould, MD;  Location: [facility];  Service: Vascular;  Laterality: Left;  Insertion left upper arm Arteriovenous goretex graft   AV FISTULA PLACEMENT  10/29/2011   Procedure: ARTERIOVENOUS (AV) FISTULA CREATION;  Surgeon: Angelia Mould, MD;  Location: [facility];  Service: Vascular;  Laterality: Right;  Creation Right Arteriovenous Fistula    [place] REMOVAL  10/04/2011   Procedure: REMOVAL OF ARTERIOVENOUS GORETEX GRAFT ([name]);  Surgeon: Elam Dutch, MD;  [facility];  Service: Vascular;  Laterality: Left;   BELOW THE KNEE AMPUTATION Right    CHOLECYSTECTOMY N/A 12/08/2015   Procedure: LAPAROSCOPIC CHOLECYSTECTOMY WITH INTRAOPERATIVE CHOLANGIOGRAM;  Surgeon: Stark Klein, MD;  Location: [facility];  Service: General;  Laterality: N/A;   ESOPHAGOGASTRODUODENOSCOPY N/A 12/24/2012   Procedure: ESOPHAGOGASTRODUODENOSCOPY (EGD);  Surgeon: Milus Banister, MD;  Location: [facility];  Service: Endoscopy;  Laterality: N/A;   FINGER AMPUTATION Right 02/26/2013   "3rd finger"   INSERTION OF DIALYSIS CATHETER  10/04/2011   Procedure: INSERTION OF DIALYSIS CATHETER;  Surgeon: Angelia Mould, MD;  Location: [facility];  Service: Vascular;  Laterality: Right;  insertion of dialysis catheter right internal jugular   INSERTION OF DIALYSIS CATHETER  06/23/2012   Procedure: INSERTION OF DIALYSIS CATHETER;  Surgeon: Angelia Mould, MD;  Location: [facility];  Service: Vascular;  Laterality: N/A;  Ultrasound guided   KIDNEY TRANSPLANT  01/24/2013   LOWER EXTREMITY ANGIOGRAPHY Left 08/12/2016   Procedure: Lower Extremity Angiography;  Surgeon: Algernon Huxley, MD;  Location: [facility];  Service: Cardiovascular;  Laterality: Left;   LOWER EXTREMITY INTERVENTION  08/12/2016   Procedure: Lower Extremity Intervention;  Surgeon: Algernon Huxley, MD;  Location: [facility];   Service: Cardiovascular;;   NEPHRECTOMY TRANSPLANTED ORGAN     PATCH ANGIOPLASTY  06/23/2012   Procedure: PATCH ANGIOPLASTY;  Surgeon: Angelia Mould, MD;  Location: [facility];  Service: Vascular;  Laterality: Right;   PERIPHERAL VASCULAR CATHETERIZATION N/A 09/19/2015   Procedure: Lower Extremity Angiography;  Surgeon: Adrian Prows, MD;  Location: [facility];  Service: Cardiovascular;  Laterality: N/A;   PERIPHERAL VASCULAR CATHETERIZATION N/A 09/19/2015   Procedure: Abdominal Aortogram;  Surgeon: Adrian Prows, MD;  Location: [facility];  Service: Cardiovascular;  Laterality: N/A;  PERIPHERAL VASCULAR CATHETERIZATION Right 09/19/2015   Procedure: Peripheral Vascular Intervention;  Surgeon: Adrian Prows, MD;  Location: [facility];  Service: Cardiovascular;  Laterality: Right;  Right Common  Iliac   PERIPHERAL VASCULAR CATHETERIZATION N/A 10/06/2015   Procedure: Lower Extremity Angiography;  Surgeon: Adrian Prows, MD;  Location: [facility];  Service: Cardiovascular;  Laterality: N/A;   PERIPHERAL VASCULAR CATHETERIZATION  10/06/2015   Procedure: Peripheral Vascular Intervention;  Surgeon: Adrian Prows, MD;  Location: [facility];  Service: Cardiovascular;;  REIA  Omnilink 6.0x29, innova 7x80  RSFA innova 5x80   REVISON OF ARTERIOVENOUS FISTULA  06/23/2012   Procedure: REVISON OF ARTERIOVENOUS FISTULA;  Surgeon: Angelia Mould, MD;  Location: [facility];  Service: Vascular;  Laterality: Right;  Ultrasound guided   SHUNTOGRAM N/A 04/06/2012   Procedure: [place]ney Mallet;  Surgeon: Angelia Mould, MD;  Location: [facility];  Service: Cardiovascular;  Laterality: N/A;    Family History  Problem Relation Age of Onset   Malignant hyperthermia Mother    Thyroid disease Mother    Hypertension Mother    Malignant hyperthermia Father    Hypertension Father    Diabetes Brother    Anesthesia problems Neg Hx    Colon cancer Neg Hx    Esophageal cancer Neg Hx    Colon  polyps Neg Hx    Rectal cancer Neg Hx    Stomach cancer Neg Hx     No Known Allergies  CBC Latest Ref Rng & Units 07/28/2019 06/28/2018 04/26/2016  WBC 4.0 - 10.5 K/uL 5.0 3.1(L) -  Hemoglobin 12.0 - 15.0 g/dL 12.2 11.4(L) 12.6  Hematocrit 36.0 - 46.0 % 40.3 38.5 -  Platelets 150 - 400 K/uL 201 152 -      CMP     Component Value Date/Time   NA 139 07/28/2019 1823   NA 137 08/22/2014 1132   K 3.1 (L) 07/28/2019 1823   K 4.3 08/22/2014 1132   CL 101 07/28/2019 1823   CL 103 02/25/2014 0757   CO2 26 07/28/2019 1823   CO2 18 (L) 08/22/2014 1132   GLUCOSE 67 (L) 07/28/2019 1823   GLUCOSE 222 (H) 08/22/2014 1132   GLUCOSE 318 03/31/2009 0815   BUN 18 07/28/2019 1823   BUN 21.6 08/22/2014 1132   CREATININE 1.04 (H) 07/28/2019 1823   CREATININE 1.3 (H) 08/22/2014 1132   CALCIUM 9.3 07/28/2019 1823   CALCIUM 9.5 08/22/2014 1132   PROT 6.5 06/28/2018 1207   PROT 6.6 08/22/2014 1132   ALBUMIN 3.5 06/28/2018 1207   ALBUMIN 3.3 (L) 08/22/2014 1132   AST 19 06/28/2018 1207   AST 25 08/22/2014 1132   ALT 12 06/28/2018 1207   ALT 15 08/22/2014 1132   ALKPHOS 39 06/28/2018 1207   ALKPHOS 547 (H) 08/22/2014 1132   BILITOT 0.4 06/28/2018 1207   BILITOT 0.41 08/22/2014 1132   GFRNONAA >60 07/28/2019 1823   GFRNONAA >60 02/25/2014 0757   GFRAA >60 07/28/2019 1823   GFRAA >60 02/25/2014 0757     No results found.     Assessment & Plan:   1. ESRD (end stage renal disease) ([place]) Currently the patient's fistula has a good thrill and bruit and underwent a limited evaluation that based on the evaluation that flow volume is adequate.  Due to the fact that the patient currently has a functional kidney and is not on dialysis intervention is not indicated at this time.  Patient will follow-up for evaluation in 1  year.  2. Essential hypertension Continue antihypertensive medications as already ordered, these medications have been reviewed and there are no changes at this time.   3.  Type 2 diabetes mellitus with peripheral neuropathy (HCC) Continue hypoglycemic medications as already ordered, these medications have been reviewed and there are no changes at this time.  Hgb A1C to be monitored as already arranged by primary service    Current Outpatient Medications on File Prior to Visit  Medication Sig Dispense Refill   albuterol (ACCUNEB) 0.63 MG/3ML nebulizer solution Take 1 ampule by nebulization every 6 (six) hours as needed for wheezing or shortness of breath.      albuterol (PROVENTIL HFA;VENTOLIN HFA) 108 (90 BASE) MCG/ACT inhaler Inhale 1 puff into the lungs every 6 (six) hours as needed for wheezing or shortness of breath.     aspirin 81 MG chewable tablet Chew 1 tablet (81 mg total) by mouth daily. 100 tablet 2   Continuous Blood Gluc Receiver (DEXCOM G6 RECEIVER) DEVI See admin instructions.     Continuous Blood Gluc Sensor (DEXCOM G6 SENSOR) MISC See admin instructions.     Continuous Blood Gluc Transmit (DEXCOM G6 TRANSMITTER) MISC See admin instructions.     diphenoxylate-atropine (LOMOTIL) 2.5-0.025 MG tablet Take 1 tablet by mouth 3 (three) times daily as needed for diarrhea or loose stools.      fluticasone (FLONASE) 50 MCG/ACT nasal spray Place 2 sprays into both nostrils daily as needed for allergies.      furosemide (LASIX) 40 MG tablet Take 40 mg by mouth daily.      insulin aspart (NOVOLOG) 100 UNIT/ML injection Inject 0-16 Units into the skin. Pt on insulin pump.  Total of 16 units per day.     Insulin Disposable Pump (OMNIPOD DASH PODS, GEN 4,) MISC SMARTSIG:SUB-Q Every 3 Days     labetalol (NORMODYNE) 100 MG tablet Take 1 tablet (100 mg total) by mouth 2 (two) times daily. 180 tablet 3   methenamine (HIPREX) 1 g tablet Take 1 g by mouth 2 (two) times daily with a meal.     mycophenolate (MYFORTIC) 180 MG EC tablet Take 540 mg by mouth 2 (two) times daily. (540mg ) in the morning and bedtime-( 3 capsules )     pantoprazole (PROTONIX) 40 MG tablet Take  40 mg by mouth at bedtime.      rosuvastatin (CRESTOR) 10 MG tablet Take 1 tablet by mouth once daily 90 tablet 0   tacrolimus (PROGRAF) 1 MG capsule Take 2 mg by mouth 2 (two) times daily.      amLODipine (NORVASC) 10 MG tablet Take 0.5 tablets (5 mg total) by mouth 2 (two) times daily. If sitting BP >140/80 mm Hg (Patient taking differently: Take 10 mg by mouth daily.) 90 tablet 3   No current facility-administered medications on file prior to visit.    There are no Patient Instructions on file for this visit. No follow-ups on file.   Kris Hartmann, NP

## 2021-05-23 DIAGNOSIS — I10 Essential (primary) hypertension: Secondary | ICD-10-CM | POA: Diagnosis not present

## 2021-05-23 DIAGNOSIS — Z794 Long term (current) use of insulin: Secondary | ICD-10-CM | POA: Diagnosis not present

## 2021-05-23 DIAGNOSIS — E782 Mixed hyperlipidemia: Secondary | ICD-10-CM | POA: Diagnosis not present

## 2021-05-23 DIAGNOSIS — R300 Dysuria: Secondary | ICD-10-CM | POA: Diagnosis not present

## 2021-05-23 DIAGNOSIS — E1165 Type 2 diabetes mellitus with hyperglycemia: Secondary | ICD-10-CM | POA: Diagnosis not present

## 2021-05-23 DIAGNOSIS — J302 Other seasonal allergic rhinitis: Secondary | ICD-10-CM | POA: Diagnosis not present

## 2021-05-23 DIAGNOSIS — K219 Gastro-esophageal reflux disease without esophagitis: Secondary | ICD-10-CM | POA: Diagnosis not present

## 2021-05-23 DIAGNOSIS — J4540 Moderate persistent asthma, uncomplicated: Secondary | ICD-10-CM | POA: Diagnosis not present

## 2021-05-30 DIAGNOSIS — E1165 Type 2 diabetes mellitus with hyperglycemia: Secondary | ICD-10-CM | POA: Diagnosis not present

## 2021-05-30 DIAGNOSIS — Z794 Long term (current) use of insulin: Secondary | ICD-10-CM | POA: Diagnosis not present

## 2021-06-13 ENCOUNTER — Ambulatory Visit: Payer: Medicare Other | Admitting: Cardiology

## 2021-06-25 DIAGNOSIS — Z940 Kidney transplant status: Secondary | ICD-10-CM | POA: Diagnosis not present

## 2021-06-25 DIAGNOSIS — N2581 Secondary hyperparathyroidism of renal origin: Secondary | ICD-10-CM | POA: Diagnosis not present

## 2021-06-25 DIAGNOSIS — N189 Chronic kidney disease, unspecified: Secondary | ICD-10-CM | POA: Diagnosis not present

## 2021-06-25 DIAGNOSIS — I10 Essential (primary) hypertension: Secondary | ICD-10-CM | POA: Diagnosis not present

## 2021-06-25 DIAGNOSIS — N182 Chronic kidney disease, stage 2 (mild): Secondary | ICD-10-CM | POA: Diagnosis not present

## 2021-06-25 DIAGNOSIS — D631 Anemia in chronic kidney disease: Secondary | ICD-10-CM | POA: Diagnosis not present

## 2021-06-25 DIAGNOSIS — I129 Hypertensive chronic kidney disease with stage 1 through stage 4 chronic kidney disease, or unspecified chronic kidney disease: Secondary | ICD-10-CM | POA: Diagnosis not present

## 2021-06-25 DIAGNOSIS — R809 Proteinuria, unspecified: Secondary | ICD-10-CM | POA: Diagnosis not present

## 2021-06-25 DIAGNOSIS — E1165 Type 2 diabetes mellitus with hyperglycemia: Secondary | ICD-10-CM | POA: Diagnosis not present

## 2021-06-25 DIAGNOSIS — N390 Urinary tract infection, site not specified: Secondary | ICD-10-CM | POA: Diagnosis not present

## 2021-06-26 DIAGNOSIS — Z940 Kidney transplant status: Secondary | ICD-10-CM | POA: Diagnosis not present

## 2021-06-28 DIAGNOSIS — I10 Essential (primary) hypertension: Secondary | ICD-10-CM | POA: Diagnosis not present

## 2021-06-28 DIAGNOSIS — E1165 Type 2 diabetes mellitus with hyperglycemia: Secondary | ICD-10-CM | POA: Diagnosis not present

## 2021-07-16 DIAGNOSIS — K219 Gastro-esophageal reflux disease without esophagitis: Secondary | ICD-10-CM | POA: Diagnosis not present

## 2021-07-16 DIAGNOSIS — I10 Essential (primary) hypertension: Secondary | ICD-10-CM | POA: Diagnosis not present

## 2021-07-16 DIAGNOSIS — E1165 Type 2 diabetes mellitus with hyperglycemia: Secondary | ICD-10-CM | POA: Diagnosis not present

## 2021-07-16 DIAGNOSIS — Z794 Long term (current) use of insulin: Secondary | ICD-10-CM | POA: Diagnosis not present

## 2021-07-16 DIAGNOSIS — E782 Mixed hyperlipidemia: Secondary | ICD-10-CM | POA: Diagnosis not present

## 2021-07-16 DIAGNOSIS — J302 Other seasonal allergic rhinitis: Secondary | ICD-10-CM | POA: Diagnosis not present

## 2021-07-16 DIAGNOSIS — J4540 Moderate persistent asthma, uncomplicated: Secondary | ICD-10-CM | POA: Diagnosis not present

## 2021-07-18 DIAGNOSIS — E1142 Type 2 diabetes mellitus with diabetic polyneuropathy: Secondary | ICD-10-CM | POA: Diagnosis not present

## 2021-07-24 ENCOUNTER — Other Ambulatory Visit: Payer: Self-pay

## 2021-07-24 ENCOUNTER — Ambulatory Visit: Payer: Medicare Other

## 2021-07-24 DIAGNOSIS — R0989 Other specified symptoms and signs involving the circulatory and respiratory systems: Secondary | ICD-10-CM

## 2021-07-24 DIAGNOSIS — I6523 Occlusion and stenosis of bilateral carotid arteries: Secondary | ICD-10-CM | POA: Diagnosis not present

## 2021-07-30 ENCOUNTER — Other Ambulatory Visit: Payer: Self-pay

## 2021-07-30 ENCOUNTER — Ambulatory Visit (INDEPENDENT_AMBULATORY_CARE_PROVIDER_SITE_OTHER): Payer: Medicare Other | Admitting: Orthopedic Surgery

## 2021-07-30 ENCOUNTER — Other Ambulatory Visit: Payer: Self-pay | Admitting: Cardiology

## 2021-07-30 DIAGNOSIS — Z89511 Acquired absence of right leg below knee: Secondary | ICD-10-CM | POA: Diagnosis not present

## 2021-08-01 ENCOUNTER — Encounter: Payer: Self-pay | Admitting: Orthopedic Surgery

## 2021-08-01 DIAGNOSIS — M2042 Other hammer toe(s) (acquired), left foot: Secondary | ICD-10-CM | POA: Diagnosis not present

## 2021-08-01 DIAGNOSIS — I739 Peripheral vascular disease, unspecified: Secondary | ICD-10-CM | POA: Diagnosis not present

## 2021-08-01 NOTE — Progress Notes (Signed)
Office Visit Note   Patient: [name]           Date of Birth: 1971/04/07           MRN: [id number] Visit Date: 07/30/2021              Requested by: Benito Mccreedy, MD [place] [address] [place],  [place] [id number] PCP: Benito Mccreedy, MD  Chief Complaint  Patient presents with   Right Leg - Follow-up      HPI: Patient is a 51 year old woman who is seen for initial evaluation for right transtibial amputation.  She is status post a right transtibial amputation in June 2017.  Patient is currently developing ulceration from subsiding into her socket.  Assessment & Plan: Visit Diagnoses:  1. Acquired absence of right leg below knee [facility])     Plan: Patient is given a prescription for biotech for a new socket liner materials and supplies.  Recommended coconut water for her muscle cramping.  Follow-Up Instructions: Return if symptoms worsen or fail to improve.   Ortho Exam  Patient is alert, oriented, no adenopathy, well-dressed, normal affect, normal respiratory effort. Examination patient has dermatitis with a superficial ulcer right transtibial amputation there is no cellulitis odor or drainage no signs of infection.  Patient reports occasional muscle spasm.  Patient is an existing right transtibial  amputee.  Patient's current comorbidities are not expected to impact the ability to function with the prescribed prosthesis. Patient verbally communicates a strong desire to use a prosthesis. Patient currently requires mobility aids to ambulate without a prosthesis.  Expects not to use mobility aids with a new prosthesis.  Patient is a K3 level ambulator that spends a lot of time walking around on uneven terrain over obstacles, up and down stairs, and ambulates with a variable cadence.     Imaging: No results found. No images are attached to the encounter.  Labs: Lab Results  Component Value Date   HGBA1C 8.3 (H) 10/13/2015   HGBA1C 6.3 (H)  12/20/2012   HGBA1C 6.1 (H) 09/27/2011   REPTSTATUS 12/09/2015 FINAL 12/06/2015   CULT 40,000 COLONIES/mL ESCHERICHIA COLI (A) 12/06/2015   LABORGA ESCHERICHIA COLI (A) 12/06/2015     Lab Results  Component Value Date   ALBUMIN 3.5 06/28/2018   ALBUMIN 1.8 (L) 12/10/2015   ALBUMIN 1.8 (L) 12/09/2015    Lab Results  Component Value Date   MG 1.9 12/11/2015   MG 2.2 12/09/2015   MG 2.5 (H) 12/08/2015   Lab Results  Component Value Date   VD25OH 24 (L) 09/28/2011    No results found for: PREALBUMIN CBC EXTENDED Latest Ref Rng & Units 07/28/2019 06/28/2018 04/26/2016  WBC 4.0 - 10.5 K/uL 5.0 3.1(L) -  RBC 3.87 - 5.11 MIL/uL 4.98 4.78 -  HGB 12.0 - 15.0 g/dL 12.2 11.4(L) 12.6  HCT 36.0 - 46.0 % 40.3 38.5 -  PLT 150 - 400 K/uL 201 152 -  NEUTROABS 1.7 - 7.7 K/uL 3.5 1.9 -  LYMPHSABS 0.7 - 4.0 K/uL 0.9 0.7 -     There is no height or weight on file to calculate BMI.  Orders:  No orders of the defined types were placed in this encounter.  No orders of the defined types were placed in this encounter.    Procedures: No procedures performed  Clinical Data: No additional findings.  ROS:  All other systems negative, except as noted in the HPI. Review of Systems  Objective: Vital Signs:  There were no vitals taken for this visit.  Specialty Comments:  No specialty comments available.  PMFS History: Patient Active Problem List   Diagnosis Date Noted   Abrasion of toe of left foot 10/05/2020   Recurrent UTI 08/21/2020   Corn of toe 07/04/2020   Cough 06/22/2020   Neurogenic orthostatic hypotension (HCC) 08/04/2018   Bilateral carotid bruits 08/04/2018   Immunosuppression ([place]) 08/04/2018   CMV (cytomegalovirus infection) ([place]) 08/04/2018   Other chronic sinusitis 06/26/2017   Steal syndrome as complication of dialysis access ([place]) 03/28/2017   History of renal transplant 12/06/2015   Below knee amputation status 11/29/2015   S/P unilateral BKA (below knee  amputation) ([place])    Type 2 diabetes mellitus with peripheral neuropathy (HCC)    Chronic kidney disease, stage IV (severe) ([place])    Essential hypertension    Anemia in chronic kidney disease    PAD (peripheral artery disease) ([place]) 09/18/2015   Leukopenia 08/22/2014   Physical deconditioning 10/09/2011   GOUT 12/21/2008   Uncontrolled type 2 diabetes mellitus with peripheral artery disease 08/14/2006   Hypercholesteremia 08/14/2006   FIBROADENOSIS, BREAST 08/14/2006   Irregular menstrual cycle 08/14/2006   Past Medical History:  Diagnosis Date   Anemia    Below knee amputation (HCC)    RIGHT   CHF (congestive heart failure) (HCC)    PT UNAWARE OF THIS DX   Chronic kidney disease    PAST HX OF ESRD- HAD KIDNEY TRANSPLANT 01-24-2013   Critical lower limb ischemia ([place]) 10/05/2015   Enlarged heart    WITH IRREGULAR HEART RATE   GERD (gastroesophageal reflux disease)    High cholesterol    History of blood transfusion    related to "kidney transplant"   Hypercholesteremia 08/14/2006   Qualifier: Diagnosis of  By: Eusebio Friendly     Hypertension    Mechanical complication of other vascular device, implant, and graft 54/27/0623   Metabolic bone disease    PAD (peripheral artery disease) ([place])    PT UNAWARE OF THIS DIAGNOSIS   Pericardial effusion    Retinopathy    Type II diabetes mellitus ([place])    "controlled with diet"    Family History  Problem Relation Age of Onset   Malignant hyperthermia Mother    Thyroid disease Mother    Hypertension Mother    Malignant hyperthermia Father    Hypertension Father    Diabetes Brother    Anesthesia problems Neg Hx    Colon cancer Neg Hx    Esophageal cancer Neg Hx    Colon polyps Neg Hx    Rectal cancer Neg Hx    Stomach cancer Neg Hx     Past Surgical History:  Procedure Laterality Date   AMPUTATION Right 10/13/2015   Procedure: Right Transmetatarsal Amputation;  Surgeon: Newt Minion, MD;  Location: [place];  Service:  Orthopedics;  Laterality: Right;   AMPUTATION Right 11/29/2015   Procedure: RIGHT BELOW KNEE AMPUTATION;  Surgeon: Newt Minion, MD;  Location: [place];  Service: Orthopedics;  Laterality: Right;   AMPUTATION FINGER     Rt hand middle finger   AV FISTULA PLACEMENT  10/04/2011   Procedure: INSERTION OF ARTERIOVENOUS (AV) GORE-TEX GRAFT ARM;  Surgeon: Angelia Mould, MD;  Location: [place];  Service: Vascular;  Laterality: Left;  Insertion left upper arm Arteriovenous goretex graft   AV FISTULA PLACEMENT  10/29/2011   Procedure: ARTERIOVENOUS (AV) FISTULA CREATION;  Surgeon: Angelia Mould, MD;  [facility]  OR;  Service: Vascular;  Laterality: Right;  Creation Right Arteriovenous Fistula    AVGG REMOVAL  10/04/2011   Procedure: REMOVAL OF ARTERIOVENOUS GORETEX GRAFT ([place]);  Surgeon: Elam Dutch, MD;  Location[facility] OR;  Service: Vascular;  Laterality: Left;   BELOW THE KNEE AMPUTATION Right    CHOLECYSTECTOMY N/A 12/08/2015   Procedure: LAPAROSCOPIC CHOLECYSTECTOMY WITH INTRAOPERATIVE CHOLANGIOGRAM;  Surgeon: Stark Klein, MD;  Location: [place];  Service: General;  Laterality: N/A;   ESOPHAGOGASTRODUODENOSCOPY N/A 12/24/2012   Procedure: ESOPHAGOGASTRODUODENOSCOPY (EGD);  Surgeon: Milus Banister, MD;  Location: [place];  Service: Endoscopy;  Laterality: N/A;   FINGER AMPUTATION Right 02/26/2013   "3rd finger"   INSERTION OF DIALYSIS CATHETER  10/04/2011   Procedure: INSERTION OF DIALYSIS CATHETER;  Surgeon: Angelia Mould, MD;  Location: [place];  Service: Vascular;  Laterality: Right;  insertion of dialysis catheter right internal jugular   INSERTION OF DIALYSIS CATHETER  06/23/2012   Procedure: INSERTION OF DIALYSIS CATHETER;  Surgeon: Angelia Mould, MD;  Location: [place];  Service: Vascular;  Laterality: N/A;  Ultrasound guided   KIDNEY TRANSPLANT  01/24/2013   LOWER EXTREMITY ANGIOGRAPHY Left 08/12/2016   Procedure: Lower Extremity Angiography;  Surgeon:  Algernon Huxley, MD;  Location: [place] CV LAB;  Service: Cardiovascular;  Laterality: Left;   LOWER EXTREMITY INTERVENTION  08/12/2016   Procedure: Lower Extremity Intervention;  Surgeon: Algernon Huxley, MD;  Location: [place] CV LAB;  Service: Cardiovascular;;   NEPHRECTOMY TRANSPLANTED ORGAN     PATCH ANGIOPLASTY  06/23/2012   Procedure: PATCH ANGIOPLASTY;  Surgeon: Angelia Mould, MD;  Location: [place];  Service: Vascular;  Laterality: Right;   PERIPHERAL VASCULAR CATHETERIZATION N/A 09/19/2015   Procedure: Lower Extremity Angiography;  Surgeon: Adrian Prows, MD;  Location: [place] CV LAB;  Service: Cardiovascular;  Laterality: N/A;   PERIPHERAL VASCULAR CATHETERIZATION N/A 09/19/2015   Procedure: Abdominal Aortogram;  Surgeon: Adrian Prows, MD;  Location: [place] CV LAB;  Service: Cardiovascular;  Laterality: N/A;   PERIPHERAL VASCULAR CATHETERIZATION Right 09/19/2015   Procedure: Peripheral Vascular Intervention;  Surgeon: Adrian Prows, MD;  Location: [place] CV [facility];  Service: Cardiovascular;  Laterality: Right;  Right Common  Iliac   PERIPHERAL VASCULAR CATHETERIZATION N/A 10/06/2015   Procedure: Lower Extremity Angiography;  Surgeon: Adrian Prows, MD;  Location: [place] CV LAB;  Service: Cardiovascular;  Laterality: N/A;   PERIPHERAL VASCULAR CATHETERIZATION  10/06/2015   Procedure: Peripheral Vascular Intervention;  Surgeon: Adrian Prows, MD;  Location: [place] CV LAB;  Service: Cardiovascular;;  REIA  Omnilink 6.0x29, innova 7x80  RSFA innova 5x80   REVISON OF ARTERIOVENOUS FISTULA  06/23/2012   Procedure: REVISON OF ARTERIOVENOUS FISTULA;  Surgeon: Angelia Mould, MD;  Location: [place];  Service: Vascular;  Laterality: Right;  Ultrasound guided   SHUNTOGRAM N/A 04/06/2012   Procedure: [place];  Surgeon: Angelia Mould, MD;  Location: [facility] CATH LAB;  Service: Cardiovascular;  Laterality: N/A;   Social History   Occupational History   Not on file   Tobacco Use   Smoking status: Never   Smokeless tobacco: Never  Vaping Use   Vaping Use: Never used  Substance and Sexual Activity   Alcohol use: No   Drug use: No   Sexual activity: Never

## 2021-08-02 ENCOUNTER — Ambulatory Visit: Payer: Medicare Other | Admitting: Orthopedic Surgery

## 2021-09-05 ENCOUNTER — Other Ambulatory Visit: Payer: Self-pay | Admitting: Cardiology

## 2021-09-17 DIAGNOSIS — E1165 Type 2 diabetes mellitus with hyperglycemia: Secondary | ICD-10-CM | POA: Diagnosis not present

## 2021-09-17 DIAGNOSIS — Z794 Long term (current) use of insulin: Secondary | ICD-10-CM | POA: Diagnosis not present

## 2021-10-12 DIAGNOSIS — Z20822 Contact with and (suspected) exposure to covid-19: Secondary | ICD-10-CM | POA: Diagnosis not present

## 2021-10-12 DIAGNOSIS — Z03818 Encounter for observation for suspected exposure to other biological agents ruled out: Secondary | ICD-10-CM | POA: Diagnosis not present

## 2021-10-17 DIAGNOSIS — E1165 Type 2 diabetes mellitus with hyperglycemia: Secondary | ICD-10-CM | POA: Diagnosis not present

## 2021-10-17 DIAGNOSIS — Z794 Long term (current) use of insulin: Secondary | ICD-10-CM | POA: Diagnosis not present

## 2021-10-17 DIAGNOSIS — Z89511 Acquired absence of right leg below knee: Secondary | ICD-10-CM | POA: Diagnosis not present

## 2021-10-23 DIAGNOSIS — N2581 Secondary hyperparathyroidism of renal origin: Secondary | ICD-10-CM | POA: Diagnosis not present

## 2021-10-23 DIAGNOSIS — N189 Chronic kidney disease, unspecified: Secondary | ICD-10-CM | POA: Diagnosis not present

## 2021-10-23 DIAGNOSIS — N390 Urinary tract infection, site not specified: Secondary | ICD-10-CM | POA: Diagnosis not present

## 2021-10-23 DIAGNOSIS — Z940 Kidney transplant status: Secondary | ICD-10-CM | POA: Diagnosis not present

## 2021-11-02 DIAGNOSIS — Z940 Kidney transplant status: Secondary | ICD-10-CM | POA: Diagnosis not present

## 2021-11-02 DIAGNOSIS — Z9641 Presence of insulin pump (external) (internal): Secondary | ICD-10-CM | POA: Diagnosis not present

## 2021-11-02 DIAGNOSIS — E1165 Type 2 diabetes mellitus with hyperglycemia: Secondary | ICD-10-CM | POA: Diagnosis not present

## 2021-11-02 DIAGNOSIS — R809 Proteinuria, unspecified: Secondary | ICD-10-CM | POA: Diagnosis not present

## 2021-11-02 DIAGNOSIS — I10 Essential (primary) hypertension: Secondary | ICD-10-CM | POA: Diagnosis not present

## 2021-11-02 DIAGNOSIS — E7800 Pure hypercholesterolemia, unspecified: Secondary | ICD-10-CM | POA: Diagnosis not present

## 2021-11-02 DIAGNOSIS — E0840 Diabetes mellitus due to underlying condition with diabetic neuropathy, unspecified: Secondary | ICD-10-CM | POA: Diagnosis not present

## 2021-11-09 ENCOUNTER — Other Ambulatory Visit: Payer: Self-pay | Admitting: Internal Medicine

## 2021-11-09 DIAGNOSIS — Z1231 Encounter for screening mammogram for malignant neoplasm of breast: Secondary | ICD-10-CM

## 2021-11-12 ENCOUNTER — Other Ambulatory Visit: Payer: Self-pay | Admitting: Cardiology

## 2021-11-14 DIAGNOSIS — N182 Chronic kidney disease, stage 2 (mild): Secondary | ICD-10-CM | POA: Diagnosis not present

## 2021-11-14 DIAGNOSIS — N2581 Secondary hyperparathyroidism of renal origin: Secondary | ICD-10-CM | POA: Diagnosis not present

## 2021-11-14 DIAGNOSIS — I129 Hypertensive chronic kidney disease with stage 1 through stage 4 chronic kidney disease, or unspecified chronic kidney disease: Secondary | ICD-10-CM | POA: Diagnosis not present

## 2021-11-14 DIAGNOSIS — Z940 Kidney transplant status: Secondary | ICD-10-CM | POA: Diagnosis not present

## 2021-11-14 DIAGNOSIS — D631 Anemia in chronic kidney disease: Secondary | ICD-10-CM | POA: Diagnosis not present

## 2021-11-16 DIAGNOSIS — Z794 Long term (current) use of insulin: Secondary | ICD-10-CM | POA: Diagnosis not present

## 2021-11-16 DIAGNOSIS — E1165 Type 2 diabetes mellitus with hyperglycemia: Secondary | ICD-10-CM | POA: Diagnosis not present

## 2021-11-27 DIAGNOSIS — J302 Other seasonal allergic rhinitis: Secondary | ICD-10-CM | POA: Diagnosis not present

## 2021-11-27 DIAGNOSIS — E782 Mixed hyperlipidemia: Secondary | ICD-10-CM | POA: Diagnosis not present

## 2021-11-27 DIAGNOSIS — J4540 Moderate persistent asthma, uncomplicated: Secondary | ICD-10-CM | POA: Diagnosis not present

## 2021-11-27 DIAGNOSIS — I10 Essential (primary) hypertension: Secondary | ICD-10-CM | POA: Diagnosis not present

## 2021-11-27 DIAGNOSIS — K219 Gastro-esophageal reflux disease without esophagitis: Secondary | ICD-10-CM | POA: Diagnosis not present

## 2021-11-27 DIAGNOSIS — R053 Chronic cough: Secondary | ICD-10-CM | POA: Diagnosis not present

## 2021-11-27 DIAGNOSIS — E1165 Type 2 diabetes mellitus with hyperglycemia: Secondary | ICD-10-CM | POA: Diagnosis not present

## 2021-11-27 DIAGNOSIS — Z794 Long term (current) use of insulin: Secondary | ICD-10-CM | POA: Diagnosis not present

## 2021-12-04 DIAGNOSIS — N390 Urinary tract infection, site not specified: Secondary | ICD-10-CM | POA: Diagnosis not present

## 2021-12-10 DIAGNOSIS — Z794 Long term (current) use of insulin: Secondary | ICD-10-CM | POA: Diagnosis not present

## 2021-12-10 DIAGNOSIS — E1165 Type 2 diabetes mellitus with hyperglycemia: Secondary | ICD-10-CM | POA: Diagnosis not present

## 2021-12-13 ENCOUNTER — Ambulatory Visit: Payer: Medicare Other

## 2021-12-18 DIAGNOSIS — H52223 Regular astigmatism, bilateral: Secondary | ICD-10-CM | POA: Diagnosis not present

## 2021-12-18 DIAGNOSIS — E119 Type 2 diabetes mellitus without complications: Secondary | ICD-10-CM | POA: Diagnosis not present

## 2021-12-19 ENCOUNTER — Ambulatory Visit
Admission: RE | Admit: 2021-12-19 | Discharge: 2021-12-19 | Disposition: A | Discharge status: Home / Self Care | Payer: Medicare Other | Source: Ambulatory Visit | Attending: Internal Medicine | Admitting: Internal Medicine

## 2021-12-19 DIAGNOSIS — Z1231 Encounter for screening mammogram for malignant neoplasm of breast: Secondary | ICD-10-CM | POA: Diagnosis not present

## 2021-12-28 DIAGNOSIS — E782 Mixed hyperlipidemia: Secondary | ICD-10-CM | POA: Diagnosis not present

## 2021-12-28 DIAGNOSIS — I10 Essential (primary) hypertension: Secondary | ICD-10-CM | POA: Diagnosis not present

## 2021-12-28 DIAGNOSIS — E1165 Type 2 diabetes mellitus with hyperglycemia: Secondary | ICD-10-CM | POA: Diagnosis not present

## 2021-12-28 DIAGNOSIS — K219 Gastro-esophageal reflux disease without esophagitis: Secondary | ICD-10-CM | POA: Diagnosis not present

## 2021-12-28 DIAGNOSIS — Z0000 Encounter for general adult medical examination without abnormal findings: Secondary | ICD-10-CM | POA: Diagnosis not present

## 2021-12-28 DIAGNOSIS — J302 Other seasonal allergic rhinitis: Secondary | ICD-10-CM | POA: Diagnosis not present

## 2021-12-28 DIAGNOSIS — J4540 Moderate persistent asthma, uncomplicated: Secondary | ICD-10-CM | POA: Diagnosis not present

## 2021-12-28 DIAGNOSIS — E559 Vitamin D deficiency, unspecified: Secondary | ICD-10-CM | POA: Diagnosis not present

## 2021-12-28 DIAGNOSIS — Z794 Long term (current) use of insulin: Secondary | ICD-10-CM | POA: Diagnosis not present

## 2021-12-31 ENCOUNTER — Other Ambulatory Visit (INDEPENDENT_AMBULATORY_CARE_PROVIDER_SITE_OTHER): Payer: Self-pay | Admitting: Nurse Practitioner

## 2021-12-31 DIAGNOSIS — I739 Peripheral vascular disease, unspecified: Secondary | ICD-10-CM

## 2021-12-31 DIAGNOSIS — N186 End stage renal disease: Secondary | ICD-10-CM

## 2022-01-01 ENCOUNTER — Ambulatory Visit (INDEPENDENT_AMBULATORY_CARE_PROVIDER_SITE_OTHER): Payer: Medicare Other | Admitting: Vascular Surgery

## 2022-01-01 ENCOUNTER — Ambulatory Visit (INDEPENDENT_AMBULATORY_CARE_PROVIDER_SITE_OTHER): Payer: Medicare Other

## 2022-01-01 ENCOUNTER — Encounter (INDEPENDENT_AMBULATORY_CARE_PROVIDER_SITE_OTHER): Payer: Medicare Other

## 2022-01-01 DIAGNOSIS — I739 Peripheral vascular disease, unspecified: Secondary | ICD-10-CM | POA: Diagnosis not present

## 2022-01-01 DIAGNOSIS — N186 End stage renal disease: Secondary | ICD-10-CM

## 2022-01-04 DIAGNOSIS — I10 Essential (primary) hypertension: Secondary | ICD-10-CM | POA: Diagnosis not present

## 2022-01-04 DIAGNOSIS — J3089 Other allergic rhinitis: Secondary | ICD-10-CM | POA: Diagnosis not present

## 2022-01-04 DIAGNOSIS — J4540 Moderate persistent asthma, uncomplicated: Secondary | ICD-10-CM | POA: Diagnosis not present

## 2022-01-04 DIAGNOSIS — E1165 Type 2 diabetes mellitus with hyperglycemia: Secondary | ICD-10-CM | POA: Diagnosis not present

## 2022-01-04 DIAGNOSIS — Z794 Long term (current) use of insulin: Secondary | ICD-10-CM | POA: Diagnosis not present

## 2022-01-04 DIAGNOSIS — K219 Gastro-esophageal reflux disease without esophagitis: Secondary | ICD-10-CM | POA: Diagnosis not present

## 2022-01-04 DIAGNOSIS — Z0001 Encounter for general adult medical examination with abnormal findings: Secondary | ICD-10-CM | POA: Diagnosis not present

## 2022-01-04 DIAGNOSIS — E782 Mixed hyperlipidemia: Secondary | ICD-10-CM | POA: Diagnosis not present

## 2022-01-04 DIAGNOSIS — J302 Other seasonal allergic rhinitis: Secondary | ICD-10-CM | POA: Diagnosis not present

## 2022-01-04 DIAGNOSIS — E559 Vitamin D deficiency, unspecified: Secondary | ICD-10-CM | POA: Diagnosis not present

## 2022-01-09 DIAGNOSIS — E1165 Type 2 diabetes mellitus with hyperglycemia: Secondary | ICD-10-CM | POA: Diagnosis not present

## 2022-01-09 DIAGNOSIS — Z794 Long term (current) use of insulin: Secondary | ICD-10-CM | POA: Diagnosis not present

## 2022-01-10 ENCOUNTER — Encounter (INDEPENDENT_AMBULATORY_CARE_PROVIDER_SITE_OTHER): Payer: Self-pay | Admitting: *Deleted

## 2022-01-18 DIAGNOSIS — Z20822 Contact with and (suspected) exposure to covid-19: Secondary | ICD-10-CM | POA: Diagnosis not present

## 2022-01-18 DIAGNOSIS — Z03818 Encounter for observation for suspected exposure to other biological agents ruled out: Secondary | ICD-10-CM | POA: Diagnosis not present

## 2022-01-21 ENCOUNTER — Telehealth: Payer: Self-pay

## 2022-01-21 NOTE — Patient Outreach (Signed)
  Care Coordination   01/21/2022 Name: [name] MRN: [id number] DOB: [birth year]   Care Coordination Outreach Attempts:  An unsuccessful telephone outreach was attempted today to offer the patient information about available care coordination services as a benefit of their health plan.   Follow Up Plan:  Additional outreach attempts will be made to offer the patient care coordination information and services.   Encounter Outcome:  No Answer  Care Coordination Interventions Activated:  No   Care Coordination Interventions:  No, not indicated    Thea Silversmith, RN, MSN, BSN, CCM Care Coordinator [phone]

## 2022-02-07 DIAGNOSIS — J329 Chronic sinusitis, unspecified: Secondary | ICD-10-CM | POA: Diagnosis not present

## 2022-02-07 DIAGNOSIS — I120 Hypertensive chronic kidney disease with stage 5 chronic kidney disease or end stage renal disease: Secondary | ICD-10-CM | POA: Diagnosis not present

## 2022-02-07 DIAGNOSIS — Z4822 Encounter for aftercare following kidney transplant: Secondary | ICD-10-CM | POA: Diagnosis not present

## 2022-02-07 DIAGNOSIS — Z79621 Long term (current) use of calcineurin inhibitor: Secondary | ICD-10-CM | POA: Diagnosis not present

## 2022-02-07 DIAGNOSIS — Z9641 Presence of insulin pump (external) (internal): Secondary | ICD-10-CM | POA: Diagnosis not present

## 2022-02-07 DIAGNOSIS — N186 End stage renal disease: Secondary | ICD-10-CM | POA: Diagnosis not present

## 2022-02-07 DIAGNOSIS — I10 Essential (primary) hypertension: Secondary | ICD-10-CM | POA: Diagnosis not present

## 2022-02-07 DIAGNOSIS — Z79899 Other long term (current) drug therapy: Secondary | ICD-10-CM | POA: Diagnosis not present

## 2022-02-07 DIAGNOSIS — J069 Acute upper respiratory infection, unspecified: Secondary | ICD-10-CM | POA: Diagnosis not present

## 2022-02-07 DIAGNOSIS — D8989 Other specified disorders involving the immune mechanism, not elsewhere classified: Secondary | ICD-10-CM | POA: Diagnosis not present

## 2022-02-07 DIAGNOSIS — E109 Type 1 diabetes mellitus without complications: Secondary | ICD-10-CM | POA: Diagnosis not present

## 2022-02-07 DIAGNOSIS — D649 Anemia, unspecified: Secondary | ICD-10-CM | POA: Diagnosis not present

## 2022-02-07 DIAGNOSIS — Z940 Kidney transplant status: Secondary | ICD-10-CM | POA: Diagnosis not present

## 2022-02-07 DIAGNOSIS — E785 Hyperlipidemia, unspecified: Secondary | ICD-10-CM | POA: Diagnosis not present

## 2022-02-08 DIAGNOSIS — Z794 Long term (current) use of insulin: Secondary | ICD-10-CM | POA: Diagnosis not present

## 2022-02-08 DIAGNOSIS — E1165 Type 2 diabetes mellitus with hyperglycemia: Secondary | ICD-10-CM | POA: Diagnosis not present

## 2022-02-12 DIAGNOSIS — I10 Essential (primary) hypertension: Secondary | ICD-10-CM | POA: Diagnosis not present

## 2022-02-12 DIAGNOSIS — J069 Acute upper respiratory infection, unspecified: Secondary | ICD-10-CM | POA: Diagnosis not present

## 2022-02-12 DIAGNOSIS — Z03818 Encounter for observation for suspected exposure to other biological agents ruled out: Secondary | ICD-10-CM | POA: Diagnosis not present

## 2022-02-12 DIAGNOSIS — J029 Acute pharyngitis, unspecified: Secondary | ICD-10-CM | POA: Diagnosis not present

## 2022-03-08 DIAGNOSIS — N183 Chronic kidney disease, stage 3 unspecified: Secondary | ICD-10-CM | POA: Diagnosis not present

## 2022-03-08 DIAGNOSIS — E1122 Type 2 diabetes mellitus with diabetic chronic kidney disease: Secondary | ICD-10-CM | POA: Diagnosis not present

## 2022-03-08 DIAGNOSIS — K219 Gastro-esophageal reflux disease without esophagitis: Secondary | ICD-10-CM | POA: Diagnosis not present

## 2022-03-08 DIAGNOSIS — I129 Hypertensive chronic kidney disease with stage 1 through stage 4 chronic kidney disease, or unspecified chronic kidney disease: Secondary | ICD-10-CM | POA: Diagnosis not present

## 2022-03-08 DIAGNOSIS — D72819 Decreased white blood cell count, unspecified: Secondary | ICD-10-CM | POA: Diagnosis not present

## 2022-03-08 DIAGNOSIS — J45909 Unspecified asthma, uncomplicated: Secondary | ICD-10-CM | POA: Diagnosis not present

## 2022-03-08 DIAGNOSIS — Z794 Long term (current) use of insulin: Secondary | ICD-10-CM | POA: Diagnosis not present

## 2022-03-08 DIAGNOSIS — E785 Hyperlipidemia, unspecified: Secondary | ICD-10-CM | POA: Diagnosis not present

## 2022-03-08 DIAGNOSIS — I739 Peripheral vascular disease, unspecified: Secondary | ICD-10-CM | POA: Diagnosis not present

## 2022-03-11 DIAGNOSIS — Z794 Long term (current) use of insulin: Secondary | ICD-10-CM | POA: Diagnosis not present

## 2022-03-11 DIAGNOSIS — E1165 Type 2 diabetes mellitus with hyperglycemia: Secondary | ICD-10-CM | POA: Diagnosis not present

## 2022-03-28 DIAGNOSIS — Z940 Kidney transplant status: Secondary | ICD-10-CM | POA: Diagnosis not present

## 2022-03-28 DIAGNOSIS — N182 Chronic kidney disease, stage 2 (mild): Secondary | ICD-10-CM | POA: Diagnosis not present

## 2022-03-28 DIAGNOSIS — I129 Hypertensive chronic kidney disease with stage 1 through stage 4 chronic kidney disease, or unspecified chronic kidney disease: Secondary | ICD-10-CM | POA: Diagnosis not present

## 2022-03-28 DIAGNOSIS — D631 Anemia in chronic kidney disease: Secondary | ICD-10-CM | POA: Diagnosis not present

## 2022-03-28 DIAGNOSIS — N2581 Secondary hyperparathyroidism of renal origin: Secondary | ICD-10-CM | POA: Diagnosis not present

## 2022-04-05 DIAGNOSIS — Z09 Encounter for follow-up examination after completed treatment for conditions other than malignant neoplasm: Secondary | ICD-10-CM | POA: Diagnosis not present

## 2022-04-05 DIAGNOSIS — D849 Immunodeficiency, unspecified: Secondary | ICD-10-CM | POA: Diagnosis not present

## 2022-04-05 DIAGNOSIS — Z87440 Personal history of urinary (tract) infections: Secondary | ICD-10-CM | POA: Diagnosis not present

## 2022-04-05 DIAGNOSIS — Z940 Kidney transplant status: Secondary | ICD-10-CM | POA: Diagnosis not present

## 2022-04-05 DIAGNOSIS — Z960 Presence of urogenital implants: Secondary | ICD-10-CM | POA: Diagnosis not present

## 2022-04-10 DIAGNOSIS — Z794 Long term (current) use of insulin: Secondary | ICD-10-CM | POA: Diagnosis not present

## 2022-04-15 ENCOUNTER — Encounter (INDEPENDENT_AMBULATORY_CARE_PROVIDER_SITE_OTHER): Payer: Self-pay

## 2022-05-09 ENCOUNTER — Other Ambulatory Visit: Payer: Self-pay | Admitting: Cardiology

## 2022-05-10 DIAGNOSIS — Z794 Long term (current) use of insulin: Secondary | ICD-10-CM | POA: Diagnosis not present

## 2022-05-16 DIAGNOSIS — E1165 Type 2 diabetes mellitus with hyperglycemia: Secondary | ICD-10-CM | POA: Diagnosis not present

## 2022-05-16 DIAGNOSIS — E7800 Pure hypercholesterolemia, unspecified: Secondary | ICD-10-CM | POA: Diagnosis not present

## 2022-05-17 DIAGNOSIS — Z9641 Presence of insulin pump (external) (internal): Secondary | ICD-10-CM | POA: Diagnosis not present

## 2022-05-17 DIAGNOSIS — Z940 Kidney transplant status: Secondary | ICD-10-CM | POA: Diagnosis not present

## 2022-05-17 DIAGNOSIS — I10 Essential (primary) hypertension: Secondary | ICD-10-CM | POA: Diagnosis not present

## 2022-05-17 DIAGNOSIS — E1165 Type 2 diabetes mellitus with hyperglycemia: Secondary | ICD-10-CM | POA: Diagnosis not present

## 2022-05-17 DIAGNOSIS — E0840 Diabetes mellitus due to underlying condition with diabetic neuropathy, unspecified: Secondary | ICD-10-CM | POA: Diagnosis not present

## 2022-05-17 DIAGNOSIS — E7800 Pure hypercholesterolemia, unspecified: Secondary | ICD-10-CM | POA: Diagnosis not present

## 2022-05-17 DIAGNOSIS — R809 Proteinuria, unspecified: Secondary | ICD-10-CM | POA: Diagnosis not present

## 2022-06-04 DIAGNOSIS — Z130 Encounter for screening for diseases of the blood and blood-forming organs and certain disorders involving the immune mechanism: Secondary | ICD-10-CM | POA: Diagnosis not present

## 2022-06-04 DIAGNOSIS — Z1389 Encounter for screening for other disorder: Secondary | ICD-10-CM | POA: Diagnosis not present

## 2022-06-12 DIAGNOSIS — E1165 Type 2 diabetes mellitus with hyperglycemia: Secondary | ICD-10-CM | POA: Diagnosis not present

## 2022-06-12 DIAGNOSIS — Z794 Long term (current) use of insulin: Secondary | ICD-10-CM | POA: Diagnosis not present

## 2022-06-21 ENCOUNTER — Encounter (HOSPITAL_COMMUNITY): Payer: Self-pay | Admitting: *Deleted

## 2022-06-22 ENCOUNTER — Ambulatory Visit (HOSPITAL_COMMUNITY)
Admission: RE | Admit: 2022-06-22 | Discharge: 2022-06-22 | Disposition: A | Discharge status: Home / Self Care | Payer: 59 | Source: Ambulatory Visit | Attending: Emergency Medicine | Admitting: Emergency Medicine

## 2022-06-22 ENCOUNTER — Encounter (HOSPITAL_COMMUNITY): Payer: Self-pay

## 2022-06-22 VITALS — BP 153/78 | HR 104 | Temp 98.7°F | Resp 18

## 2022-06-22 DIAGNOSIS — J069 Acute upper respiratory infection, unspecified: Secondary | ICD-10-CM

## 2022-06-22 MED ORDER — AMOXICILLIN-POT CLAVULANATE 875-125 MG PO TABS: 1.0000 | ORAL_TABLET | Freq: Two times a day (BID) | ORAL | 0 refills | Status: DC

## 2022-06-22 MED ORDER — PROMETHAZINE-DM 6.25-15 MG/5ML PO SYRP: 5.0000 mL | ORAL_SOLUTION | Freq: Four times a day (QID) | ORAL | 0 refills | Status: DC | PRN

## 2022-06-22 MED ORDER — FLUTICASONE PROPIONATE 50 MCG/ACT NA SUSP: 2.0000 | Freq: Every day | NASAL | 0 refills | Status: DC | PRN

## 2022-06-22 MED ORDER — BENZONATATE 100 MG PO CAPS: 100.0000 mg | ORAL_CAPSULE | Freq: Three times a day (TID) | ORAL | 0 refills | Status: DC

## 2022-06-22 NOTE — ED Provider Notes (Signed)
[place]    CSN: [id number] Arrival date & time: 06/22/22  1236      History   Chief Complaint Chief Complaint  Patient presents with   Cough    Fever, congestion and cough - Entered by patient    HPI [name] is a 52 y.o. female.   [place] for evaluation of fever, chills, nasal congestion, rhinorrhea, sinus pain and pressure, sore throat, cough, vomiting and diarrhea present for 10 days.  Symptoms began after returning home on a flight from [place] but no known sick contacts.  Endorses a burning sensation beginning in the nose and cheeks radiating to the hairline.  Every 7 subjective.  Last occurrence of vomiting and diarrhea 1 day ago, emesis described as mucus and food, stool is described as watery to soft.  Has attempted use of Tylenol Cold and for and Flonase nasal spray without relief.  Denies respiratory history.  Past Medical History:  Diagnosis Date   Anemia    Below knee amputation (HCC)    RIGHT   CHF (congestive heart failure) (HCC)    PT UNAWARE OF THIS DX   Chronic kidney disease    PAST HX OF ESRD- HAD KIDNEY TRANSPLANT 01-24-2013   Critical lower limb ischemia (HCC) 10/05/2015   Enlarged heart    WITH IRREGULAR HEART RATE   GERD (gastroesophageal reflux disease)    High cholesterol    History of blood transfusion    related to "kidney transplant"   Hypercholesteremia 08/14/2006   Qualifier: Diagnosis of  By: Eusebio Friendly     Hypertension    Mechanical complication of other vascular device, implant, and graft 54/65/6812   Metabolic bone disease    PAD (peripheral artery disease) ([place])    PT UNAWARE OF THIS DIAGNOSIS   Pericardial effusion    Retinopathy    Type II diabetes mellitus ([place])    "controlled with diet"    Patient Active Problem List   Diagnosis Date Noted   Abrasion of toe of left foot 10/05/2020   Recurrent UTI 08/21/2020   Corn of toe 07/04/2020   Cough 06/22/2020   Neurogenic orthostatic hypotension ([place])  08/04/2018   Bilateral carotid bruits 08/04/2018   Immunosuppression ([place]) 08/04/2018   CMV (cytomegalovirus infection) ([place]) 08/04/2018   Other chronic sinusitis 06/26/2017   Steal syndrome as complication of dialysis access ([place]) 03/28/2017   History of renal transplant 12/06/2015   Below knee amputation status 11/29/2015   S/P unilateral BKA (below knee amputation) ([place])    Type 2 diabetes mellitus with peripheral neuropathy (HCC)    Chronic kidney disease, stage IV (severe) ([place])    Essential hypertension    Anemia in chronic kidney disease    PAD (peripheral artery disease) ([place]) 09/18/2015   Leukopenia 08/22/2014   Physical deconditioning 10/09/2011   GOUT 12/21/2008   Uncontrolled type 2 diabetes mellitus with peripheral artery disease 08/14/2006   Hypercholesteremia 08/14/2006   FIBROADENOSIS, BREAST 08/14/2006   Irregular menstrual cycle 08/14/2006    Past Surgical History:  Procedure Laterality Date   AMPUTATION Right 10/13/2015   Procedure: Right Transmetatarsal Amputation;  Surgeon: Newt Minion, MD;  Location: [place];  Service: Orthopedics;  Laterality: Right;   AMPUTATION Right 11/29/2015   Procedure: RIGHT BELOW KNEE AMPUTATION;  Surgeon: Newt Minion, MD;  Location: [place];  Service: Orthopedics;  Laterality: Right;   AMPUTATION FINGER     Rt hand middle finger   AV FISTULA PLACEMENT  10/04/2011  Procedure: INSERTION OF ARTERIOVENOUS (AV) GORE-TEX GRAFT ARM;  Surgeon: Angelia Mould, MD;  Location: [place];  Service: Vascular;  Laterality: Left;  Insertion left upper arm Arteriovenous goretex graft   AV FISTULA PLACEMENT  10/29/2011   Procedure: ARTERIOVENOUS (AV) FISTULA CREATION;  Surgeon: Angelia Mould, MD;  Location: [place];  Service: Vascular;  Laterality: Right;  Creation Right Arteriovenous Fistula    [place] REMOVAL  10/04/2011   Procedure: REMOVAL OF ARTERIOVENOUS GORETEX GRAFT ([place]);  Surgeon: Elam Dutch, MD;  Location[facility] OR;  Service:  Vascular;  Laterality: Left;   BELOW THE KNEE AMPUTATION Right    CHOLECYSTECTOMY N/A 12/08/2015   Procedure: LAPAROSCOPIC CHOLECYSTECTOMY WITH INTRAOPERATIVE CHOLANGIOGRAM;  Surgeon: Stark Klein, MD;  Location: [place];  Service: General;  Laterality: N/A;   ESOPHAGOGASTRODUODENOSCOPY N/A 12/24/2012   Procedure: ESOPHAGOGASTRODUODENOSCOPY (EGD);  Surgeon: Milus Banister, MD;  Location: [place];  Service: Endoscopy;  Laterality: N/A;   FINGER AMPUTATION Right 02/26/2013   "3rd finger"   INSERTION OF DIALYSIS CATHETER  10/04/2011   Procedure: INSERTION OF DIALYSIS CATHETER;  Surgeon: Angelia Mould, MD;  Location: [place];  Service: Vascular;  Laterality: Right;  insertion of dialysis catheter right internal jugular   INSERTION OF DIALYSIS CATHETER  06/23/2012   Procedure: INSERTION OF DIALYSIS CATHETER;  Surgeon: Angelia Mould, MD;  Location: [place];  Service: Vascular;  Laterality: N/A;  Ultrasound guided   KIDNEY TRANSPLANT  01/24/2013   LOWER EXTREMITY ANGIOGRAPHY Left 08/12/2016   Procedure: Lower Extremity Angiography;  Surgeon: Algernon Huxley, MD;  Location: [place] [facility];  Service: Cardiovascular;  Laterality: Left;   LOWER EXTREMITY INTERVENTION  08/12/2016   Procedure: Lower Extremity Intervention;  Surgeon: Algernon Huxley, MD;  Location: [place] [facility];  Service: Cardiovascular;;   NEPHRECTOMY TRANSPLANTED ORGAN     PATCH ANGIOPLASTY  06/23/2012   Procedure: PATCH ANGIOPLASTY;  Surgeon: Angelia Mould, MD;  Location: [place];  Service: Vascular;  Laterality: Right;   PERIPHERAL VASCULAR CATHETERIZATION N/A 09/19/2015   Procedure: Lower Extremity Angiography;  Surgeon: Adrian Prows, MD;  Location: [place] [facility];  Service: Cardiovascular;  Laterality: N/A;   PERIPHERAL VASCULAR CATHETERIZATION N/A 09/19/2015   Procedure: Abdominal Aortogram;  Surgeon: Adrian Prows, MD;  Location: [place] [facility];  Service: Cardiovascular;  Laterality: N/A;   PERIPHERAL  VASCULAR CATHETERIZATION Right 09/19/2015   Procedure: Peripheral Vascular Intervention;  Surgeon: Adrian Prows, MD;  Location: [place] [facility];  Service: Cardiovascular;  Laterality: Right;  Right Common  Iliac   PERIPHERAL VASCULAR CATHETERIZATION N/A 10/06/2015   Procedure: Lower Extremity Angiography;  Surgeon: Adrian Prows, MD;  Location: [place] [facility];  Service: Cardiovascular;  Laterality: N/A;   PERIPHERAL VASCULAR CATHETERIZATION  10/06/2015   Procedure: Peripheral Vascular Intervention;  Surgeon: Adrian Prows, MD;  Location: [place] [facility];  Service: Cardiovascular;;  REIA  Omnilink 6.0x29, innova 7x80  RSFA innova 5x80   REVISON OF ARTERIOVENOUS FISTULA  06/23/2012   Procedure: REVISON OF ARTERIOVENOUS FISTULA;  Surgeon: Angelia Mould, MD;  Location: [place];  Service: Vascular;  Laterality: Right;  Ultrasound guided   SHUNTOGRAM N/A 04/06/2012   Procedure: Earney Mallet;  Surgeon: Angelia Mould, MD;  Location[facility] CATH LAB;  Service: Cardiovascular;  Laterality: N/A;    OB History   No obstetric history on file.      Home Medications    Prior to Admission medications   Medication Sig Start Date End  Date Taking? Authorizing Provider  albuterol (ACCUNEB) 0.63 MG/3ML nebulizer solution Take 1 ampule by nebulization every 6 (six) hours as needed for wheezing or shortness of breath.  11/07/18   [provider]  albuterol (PROVENTIL HFA;VENTOLIN HFA) 108 (90 BASE) MCG/ACT inhaler Inhale 1 puff into the lungs every 6 (six) hours as needed for wheezing or shortness of breath.    [provider]  amLODipine (NORVASC) 10 MG tablet Take 0.5 tablets (5 mg total) by mouth 2 (two) times daily. If sitting BP >140/80 mm Hg Patient taking differently: Take 10 mg by mouth daily. 03/22/19 02/15/21  Adrian Prows, MD  aspirin 81 MG chewable tablet Chew 1 tablet (81 mg total) by mouth daily. 12/09/20   Adrian Prows, MD  Continuous Blood Gluc Receiver ([place]) [place]  See admin instructions. 02/20/21   [provider]  Continuous Blood Gluc Sensor (DEXCOM G6 SENSOR) MISC See admin instructions. 02/20/21   [provider]  Continuous Blood Gluc Transmit (DEXCOM G6 TRANSMITTER) MISC See admin instructions. 02/20/21   [provider]  diphenoxylate-atropine (LOMOTIL) 2.5-0.025 MG tablet Take 1 tablet by mouth 3 (three) times daily as needed for diarrhea or loose stools.  05/31/19   [provider]  fluticasone (FLONASE) 50 MCG/ACT nasal spray Place 2 sprays into both nostrils daily as needed for allergies.  06/28/19   [provider]  furosemide (LASIX) 40 MG tablet Take 40 mg by mouth daily.  12/25/16   [provider]  insulin aspart (NOVOLOG) 100 UNIT/ML injection Inject 0-16 Units into the skin. Pt on insulin pump.  Total of 16 units per day.    [provider]  Insulin Disposable Pump (OMNIPOD DASH PODS, GEN 4,) MISC SMARTSIG:SUB-Q Every 3 Days 08/22/20   [provider]  labetalol (NORMODYNE) 100 MG tablet Take 1 tablet (100 mg total) by mouth 2 (two) times daily. 06/05/20   Adrian Prows, MD  methenamine (HIPREX) 1 g tablet Take 1 g by mouth 2 (two) times daily with a meal.    [provider]  mycophenolate (MYFORTIC) 180 MG EC tablet Take 540 mg by mouth 2 (two) times daily. ('540mg'$ ) in the morning and bedtime-( 3 capsules )    [provider]  pantoprazole (PROTONIX) 40 MG tablet Take 40 mg by mouth at bedtime.     [provider]  rosuvastatin (CRESTOR) 10 MG tablet Take 1 tablet by mouth once daily 11/13/21   Adrian Prows, MD  tacrolimus (PROGRAF) 1 MG capsule Take 2 mg by mouth 2 (two) times daily.     [provider]    Family History Family History  Problem Relation Age of Onset   Malignant hyperthermia Mother    Thyroid disease Mother    Hypertension Mother    Malignant hyperthermia Father    Hypertension Father    Diabetes Brother    Anesthesia problems  Neg Hx    Colon cancer Neg Hx    Esophageal cancer Neg Hx    Colon polyps Neg Hx    Rectal cancer Neg Hx    Stomach cancer Neg Hx     Social History Social History   Tobacco Use   Smoking status: Never   Smokeless tobacco: Never  Vaping Use   Vaping Use: Never used  Substance Use Topics   Alcohol use: No   Drug use: No     Allergies   Patient has no known allergies.   Review of Systems Review of Systems  Constitutional:  Positive for appetite change, chills and fever. Negative for activity change, diaphoresis, fatigue and unexpected weight change.  HENT:  Positive for congestion, rhinorrhea, sinus pressure, sinus pain and sore throat. Negative for dental problem, drooling, ear discharge, ear pain, facial swelling, hearing loss, mouth sores, nosebleeds, postnasal drip, sneezing, tinnitus, trouble swallowing and voice change.   Respiratory:  Positive for cough. Negative for apnea, choking, chest tightness, shortness of breath, wheezing and stridor.   Cardiovascular: Negative.   Gastrointestinal: Negative.   Skin: Negative.      Physical Exam Triage Vital Signs ED Triage Vitals  Enc Vitals Group     BP 06/22/22 1248 (!) 153/78     Pulse Rate 06/22/22 1248 (!) 104     Resp 06/22/22 1248 18     Temp 06/22/22 1248 98.7 F (37.1 C)     Temp Source 06/22/22 1248 Oral     SpO2 06/22/22 1248 97 %     Weight --      Height --      Head Circumference --      Peak Flow --      Pain Score 06/22/22 1247 5     Pain Loc --      Pain Edu? --      Excl. in [place]? --    No data found.  Updated Vital Signs BP (!) 153/78 (BP Location: Left Arm)   Pulse (!) 104   Temp 98.7 F (37.1 C) (Oral)   Resp 18   LMP 06/13/2022   SpO2 97%   Visual Acuity Right Eye Distance:   Left Eye Distance:   Bilateral Distance:    Right Eye Near:   Left Eye Near:    Bilateral Near:     Physical Exam Constitutional:      Appearance: Normal appearance.  HENT:     Head: Normocephalic.      Right Ear: Tympanic membrane, ear canal and external ear normal.     Left Ear: Tympanic membrane, ear canal and external ear normal.     Nose: Congestion and rhinorrhea present.     Mouth/Throat:     Mouth: Mucous membranes are moist.     Pharynx: No posterior oropharyngeal erythema.  Cardiovascular:     Rate and Rhythm: Normal rate and regular rhythm.     Pulses: Normal pulses.     Heart sounds: Normal heart sounds.  Pulmonary:     Effort: Pulmonary effort is normal.     Breath sounds: Normal breath sounds.  Skin:    General: Skin is warm and dry.  Neurological:     Mental Status: She is alert and oriented to person, place, and time. Mental status is at baseline.  Psychiatric:        Mood and Affect: Mood normal.        Behavior: Behavior normal.      UC Treatments / Results  Labs (all labs ordered are listed, but only abnormal results are displayed) Labs Reviewed - No data to display  EKG   Radiology No results found.  Procedures Procedures (including critical care time)  Medications Ordered in UC Medications - No data to display  Initial Impression / Assessment and Plan / UC Course  I have reviewed the triage vital signs and the nursing notes.  Pertinent labs & imaging results that were available during my care of the patient were reviewed by me and considered in my medical decision making (see chart for details).  Acute  upper respiratory infection  Patient is in no signs of distress nor toxic appearing.  Vital signs are stable.  Low suspicion for pneumonia, pneumothorax or bronchitis and therefore will defer imaging.  Due to timeline will defer viral testing.  As symptoms have been present for 10 days without signs of resolution we will put provide bacterial coverage.  Prescribed Augmentin, Tessalon, Promethazine DM and refilled Flonase.May use additional over-the-counter medications as needed for supportive care.  May follow-up with urgent care as needed if  symptoms persist or worsen.     Final Clinical Impressions(s) / UC Diagnoses   Final diagnoses:  None   Discharge Instructions   None    ED Prescriptions   None    PDMP not reviewed this encounter.   Hans Eden, NP 06/22/22 1317

## 2022-06-22 NOTE — ED Triage Notes (Signed)
Pt reports came back from [place] 12/27 and been sick-cough, sinus burning, congestion, decreased appetite, fever, hot/cold spells, body aches and sore throat. Took Flonase and Tylenol cold and flu,

## 2022-06-22 NOTE — Discharge Instructions (Addendum)
Symptoms today most likely began as a virus however if they progress to 10 days with no signs of resolution we will provide coverage for bacteria which may be causing it to prolong, begin Augmentin every morning and every evening for 7 days, ideally will begin to see improvement in about 48 hours  Flonase has been refilled  You may use Tessalon pill every 8 hours to help calm your coughing  You may use cough syrup every 6 hours as needed for additional comfort, this is a mixture medicine that will also help with nausea and vomiting, be mindful this medication may make you feel drowsy    You can take Tylenol and/or Ibuprofen as needed for fever reduction and pain relief.   For cough: honey 1/2 to 1 teaspoon (you can dilute the honey in water or another fluid).  You can also use guaifenesin and dextromethorphan for cough. You can use a humidifier for chest congestion and cough.  If you don't have a humidifier, you can sit in the bathroom with the hot shower running.      For sore throat: try warm salt water gargles, cepacol lozenges, throat spray, warm tea or water with lemon/honey, popsicles or ice, or OTC cold relief medicine for throat discomfort.   For congestion: take a daily anti-histamine like Zyrtec, Claritin, and a oral decongestant, such as pseudoephedrine.  You can also use Flonase 1-2 sprays in each nostril daily.   It is important to stay hydrated: drink plenty of fluids (water, gatorade/powerade/pedialyte, juices, or teas) to keep your throat moisturized and help further relieve irritation/discomfort.

## 2022-06-27 DIAGNOSIS — E1165 Type 2 diabetes mellitus with hyperglycemia: Secondary | ICD-10-CM | POA: Diagnosis not present

## 2022-06-28 DIAGNOSIS — J069 Acute upper respiratory infection, unspecified: Secondary | ICD-10-CM | POA: Diagnosis not present

## 2022-07-04 DIAGNOSIS — E0840 Diabetes mellitus due to underlying condition with diabetic neuropathy, unspecified: Secondary | ICD-10-CM | POA: Diagnosis not present

## 2022-07-04 DIAGNOSIS — I10 Essential (primary) hypertension: Secondary | ICD-10-CM | POA: Diagnosis not present

## 2022-07-04 DIAGNOSIS — E1165 Type 2 diabetes mellitus with hyperglycemia: Secondary | ICD-10-CM | POA: Diagnosis not present

## 2022-07-04 DIAGNOSIS — E7800 Pure hypercholesterolemia, unspecified: Secondary | ICD-10-CM | POA: Diagnosis not present

## 2022-07-04 DIAGNOSIS — Z940 Kidney transplant status: Secondary | ICD-10-CM | POA: Diagnosis not present

## 2022-07-04 DIAGNOSIS — R809 Proteinuria, unspecified: Secondary | ICD-10-CM | POA: Diagnosis not present

## 2022-07-04 DIAGNOSIS — Z9641 Presence of insulin pump (external) (internal): Secondary | ICD-10-CM | POA: Diagnosis not present

## 2022-07-12 DIAGNOSIS — E1165 Type 2 diabetes mellitus with hyperglycemia: Secondary | ICD-10-CM | POA: Diagnosis not present

## 2022-07-12 DIAGNOSIS — Z794 Long term (current) use of insulin: Secondary | ICD-10-CM | POA: Diagnosis not present

## 2022-07-17 DIAGNOSIS — Z940 Kidney transplant status: Secondary | ICD-10-CM | POA: Diagnosis not present

## 2022-07-17 DIAGNOSIS — N189 Chronic kidney disease, unspecified: Secondary | ICD-10-CM | POA: Diagnosis not present

## 2022-07-21 ENCOUNTER — Ambulatory Visit (HOSPITAL_COMMUNITY)
Admission: EM | Admit: 2022-07-21 | Discharge: 2022-07-21 | Disposition: A | Discharge status: Home / Self Care | Payer: 59 | Attending: Internal Medicine | Admitting: Internal Medicine

## 2022-07-21 ENCOUNTER — Encounter (HOSPITAL_COMMUNITY): Payer: Self-pay | Admitting: *Deleted

## 2022-07-21 DIAGNOSIS — E1165 Type 2 diabetes mellitus with hyperglycemia: Secondary | ICD-10-CM | POA: Diagnosis not present

## 2022-07-21 DIAGNOSIS — Z794 Long term (current) use of insulin: Secondary | ICD-10-CM

## 2022-07-21 HISTORY — DX: Orthostatic hypotension: I95.1

## 2022-07-21 HISTORY — DX: Kidney transplant status: Z94.0

## 2022-07-21 LAB — POCT URINALYSIS DIPSTICK, ED / UC
Bilirubin Urine: NEGATIVE
Glucose, UA: 100 mg/dL — AB
Hgb urine dipstick: NEGATIVE
Leukocytes,Ua: NEGATIVE
Nitrite: NEGATIVE
Protein, ur: NEGATIVE mg/dL
Specific Gravity, Urine: 1.015 (ref 1.005–1.030)
Urobilinogen, UA: 0.2 mg/dL (ref 0.0–1.0)
pH: 5 (ref 5.0–8.0)

## 2022-07-21 LAB — CBG MONITORING, ED: Glucose-Capillary: 343 mg/dL — ABNORMAL HIGH (ref 70–99)

## 2022-07-21 MED ORDER — INSULIN PEN NEEDLE 30G X 8 MM MISC: 1.0000 | 0 refills | Status: DC | PRN

## 2022-07-21 MED ORDER — OMNIPOD 5 DEXG7G6 PODS GEN 5 MISC: 1 refills | Status: DC

## 2022-07-21 MED ORDER — OMNIPOD 5 DEXG7G6 PODS GEN 5 MISC: 0 refills | Status: DC

## 2022-07-21 NOTE — Discharge Instructions (Addendum)
I have refilled your OmniPod and I have sent insulin pen needles to the pharmacy for you.  Use the app on your phone to determine how much insulin to administer on your regular schedule based on your blood sugar and the amount of carbs that you have eaten.  Call your endocrinologist for further information on how to use your insulin pen.  Schedule an appointment with your endocrinologist for follow-up this week.  If you develop any new or worsening symptoms or do not improve in the next 2 to 3 days, please return.  If your symptoms are severe, please go to the emergency room.  Follow-up with your primary care provider for further evaluation and management of your symptoms as well as ongoing wellness visits.  I hope you feel better!

## 2022-07-21 NOTE — ED Notes (Signed)
Pt states her current blood glucose level = 344 per continuous monitor

## 2022-07-21 NOTE — ED Triage Notes (Signed)
Pt states she normally wears insulin pump; states she went to change it out 2 days ago, and noticed she has no refills by the PCP; states she submitted the request for refills, but in the meantime, she is out of insulin. States no longer has the Novolog since being on the pump. Pt is requesting insulin to get her through until her refills are able to be filled. States CBG = 354 upon arrival to [facility].

## 2022-07-23 NOTE — ED Provider Notes (Signed)
[place]    CSN: [id number] Arrival date & time: 07/21/22  1401      History   Chief Complaint Chief Complaint  Patient presents with   Hyperglycemia    HPI [name] is a 52 y.o. female.   Patient with type 2 diabetes mellitus on insulin presents to urgent care for evaluation of hyperglycemia for the last 2-3 days after she ran out of her novolog insulin to go in her insulin pump. She has a continuous blood glucose monitor as well as insulin pump that is managed by her endocrinologist. She went to change out her empty insulin cartridge for her pup 2-3 days ago when she discovered that she did not have any left and did not have any refills. She has been attempting to manage her diabetes with diet over the last few days without insulin, but states her blood sugars have been rising and her sugar is now in the 350s. She is not experiencing any nausea, vomiting, abdominal pain, dizziness, confusion, vision changes, headache, or chest pain. Patient states she was able to contact her endocrinologist who sent in a flex pen to the pharmacy for her to be able to use but they did not send in the pen needles or a sliding scale for her to use. Patient was able to find an app on her phone where she is able to perform the same function she usually does with her insulin pump by entering the number of carbs she eats as well as the current blood sugar. The app tells her how much insulin to take. She would like a box of the insulin cartridges for her pump sent to the pharmacy (these are special and mail only so she will have to use the pen until she is able to get them). She plans to call her endocrinologist first thing Monday morning (tomorrow) to schedule a follow-up appointment to ensure she does not run into this problem in the future. Denies polyuria, polydipsia, urinary symptoms, vaginal symptoms, and paresthesias.    Hyperglycemia   Past Medical History:  Diagnosis Date   Anemia     Below knee amputation (HCC)    RIGHT   CHF (congestive heart failure) ([place])    PT UNAWARE OF THIS DX   Chronic kidney disease    PAST HX OF ESRD- HAD KIDNEY TRANSPLANT 01-24-2013   Critical lower limb ischemia (HCC) 10/05/2015   Enlarged heart    WITH IRREGULAR HEART RATE   GERD (gastroesophageal reflux disease)    High cholesterol    History of blood transfusion    related to "kidney transplant"   Hypercholesteremia 08/14/2006   Qualifier: Diagnosis of  By: Eusebio Friendly     Hypertension    Kidney transplant recipient    01/24/2013   Mechanical complication of other vascular device, implant, and graft 21/19/4174   Metabolic bone disease    Orthostatic hypotension    PAD (peripheral artery disease) ([place])    PT UNAWARE OF THIS DIAGNOSIS   Pericardial effusion    Retinopathy    Type II diabetes mellitus ([place])    "controlled with diet"    Patient Active Problem List   Diagnosis Date Noted   Abrasion of toe of left foot 10/05/2020   Recurrent UTI 08/21/2020   Corn of toe 07/04/2020   Cough 06/22/2020   Neurogenic orthostatic hypotension ([place]) 08/04/2018   Bilateral carotid bruits 08/04/2018   Immunosuppression ([place]) 08/04/2018   CMV (cytomegalovirus infection) ([place]) 08/04/2018  Other chronic sinusitis 06/26/2017   Steal syndrome as complication of dialysis access [facility]) 03/28/2017   History of renal transplant 12/06/2015   Below knee amputation status 11/29/2015   S/P unilateral BKA (below knee amputation) ([place])    Type 2 diabetes mellitus with peripheral neuropathy (HCC)    Chronic kidney disease, stage IV (severe) (HCC)    Essential hypertension    Anemia in chronic kidney disease    PAD (peripheral artery disease) ([place]) 09/18/2015   Leukopenia 08/22/2014   Physical deconditioning 10/09/2011   GOUT 12/21/2008   Uncontrolled type 2 diabetes mellitus with peripheral artery disease 08/14/2006   Hypercholesteremia 08/14/2006   FIBROADENOSIS, BREAST 08/14/2006    Irregular menstrual cycle 08/14/2006    Past Surgical History:  Procedure Laterality Date   AMPUTATION Right 10/13/2015   Procedure: Right Transmetatarsal Amputation;  Surgeon: Newt Minion, MD;  Location: [place];  Service: Orthopedics;  Laterality: Right;   AMPUTATION Right 11/29/2015   Procedure: RIGHT BELOW KNEE AMPUTATION;  Surgeon: Newt Minion, MD;  Location: [place];  Service: Orthopedics;  Laterality: Right;   AMPUTATION FINGER     Rt hand middle finger   AV FISTULA PLACEMENT  10/04/2011   Procedure: INSERTION OF ARTERIOVENOUS (AV) GORE-TEX GRAFT ARM;  Surgeon: Angelia Mould, MD;  Location: [place];  Service: Vascular;  Laterality: Left;  Insertion left upper arm Arteriovenous goretex graft   AV FISTULA PLACEMENT  10/29/2011   Procedure: ARTERIOVENOUS (AV) FISTULA CREATION;  Surgeon: Angelia Mould, MD;  Location: [place];  Service: Vascular;  Laterality: Right;  Creation Right Arteriovenous Fistula    [place] REMOVAL  10/04/2011   Procedure: REMOVAL OF ARTERIOVENOUS GORETEX GRAFT ([place]);  Surgeon: Elam Dutch, MD;  [facility];  Service: Vascular;  Laterality: Left;   BELOW THE KNEE AMPUTATION Right    BLADDER SUSPENSION     03/08/2022   CHOLECYSTECTOMY N/A 12/08/2015   Procedure: LAPAROSCOPIC CHOLECYSTECTOMY WITH INTRAOPERATIVE CHOLANGIOGRAM;  Surgeon: Stark Klein, MD;  Location: [place];  Service: General;  Laterality: N/A;   ESOPHAGOGASTRODUODENOSCOPY N/A 12/24/2012   Procedure: ESOPHAGOGASTRODUODENOSCOPY (EGD);  Surgeon: Milus Banister, MD;  Location: [place];  Service: Endoscopy;  Laterality: N/A;   FINGER AMPUTATION Right 02/26/2013   "3rd finger"   INSERTION OF DIALYSIS CATHETER  10/04/2011   Procedure: INSERTION OF DIALYSIS CATHETER;  Surgeon: Angelia Mould, MD;  Location: [place];  Service: Vascular;  Laterality: Right;  insertion of dialysis catheter right internal jugular   INSERTION OF DIALYSIS CATHETER  06/23/2012   Procedure: INSERTION OF  DIALYSIS CATHETER;  Surgeon: Angelia Mould, MD;  Location: [place];  Service: Vascular;  Laterality: N/A;  Ultrasound guided   KIDNEY TRANSPLANT  01/24/2013   LOWER EXTREMITY ANGIOGRAPHY Left 08/12/2016   Procedure: Lower Extremity Angiography;  Surgeon: Algernon Huxley, MD;  Location: [place] CV LAB;  Service: Cardiovascular;  Laterality: Left;   LOWER EXTREMITY INTERVENTION  08/12/2016   Procedure: Lower Extremity Intervention;  Surgeon: Algernon Huxley, MD;  Location: [place] [facility] LAB;  Service: Cardiovascular;;   NEPHRECTOMY TRANSPLANTED ORGAN     PATCH ANGIOPLASTY  06/23/2012   Procedure: PATCH ANGIOPLASTY;  Surgeon: Angelia Mould, MD;  Location: [place];  Service: Vascular;  Laterality: Right;   PERIPHERAL VASCULAR CATHETERIZATION N/A 09/19/2015   Procedure: Lower Extremity Angiography;  Surgeon: Adrian Prows, MD;  Location: [place] CV LAB;  Service: Cardiovascular;  Laterality: N/A;   PERIPHERAL VASCULAR CATHETERIZATION N/A 09/19/2015  Procedure: Abdominal Aortogram;  Surgeon: Adrian Prows, MD;  Location: [place] [facility] LAB;  Service: Cardiovascular;  Laterality: N/A;   PERIPHERAL VASCULAR CATHETERIZATION Right 09/19/2015   Procedure: Peripheral Vascular Intervention;  Surgeon: Adrian Prows, MD;  Location: [place] CV LAB;  Service: Cardiovascular;  Laterality: Right;  Right Common  Iliac   PERIPHERAL VASCULAR CATHETERIZATION N/A 10/06/2015   Procedure: Lower Extremity Angiography;  Surgeon: Adrian Prows, MD;  Location: [place] CV LAB;  Service: Cardiovascular;  Laterality: N/A;   PERIPHERAL VASCULAR CATHETERIZATION  10/06/2015   Procedure: Peripheral Vascular Intervention;  Surgeon: Adrian Prows, MD;  Location: [place] [facility];  Service: Cardiovascular;;  REIA  Omnilink 6.0x29, innova 7x80  RSFA innova 5x80   REVISON OF ARTERIOVENOUS FISTULA  06/23/2012   Procedure: REVISON OF ARTERIOVENOUS FISTULA;  Surgeon: Angelia Mould, MD;  Location: [place];  Service: Vascular;   Laterality: Right;  Ultrasound guided   SHUNTOGRAM N/A 04/06/2012   Procedure: [place];  Surgeon: Angelia Mould, MD;  Location: [facility] CATH LAB;  Service: Cardiovascular;  Laterality: N/A;    OB History   No obstetric history on file.      Home Medications    Prior to Admission medications   Medication Sig Start Date End Date Taking? Authorizing Provider  amLODipine (NORVASC) 10 MG tablet Take 0.5 tablets (5 mg total) by mouth 2 (two) times daily. If sitting BP >140/80 mm Hg Patient taking differently: Take 10 mg by mouth daily. 03/22/19 07/21/22 Yes Adrian Prows, MD  Continuous Blood Gluc Receiver ([place]) [place] See admin instructions. 02/20/21  Yes [provider]  Continuous Blood Gluc Sensor (DEXCOM G6 SENSOR) MISC See admin instructions. 02/20/21  Yes [provider]  Continuous Blood Gluc Transmit (DEXCOM G6 TRANSMITTER) MISC See admin instructions. 02/20/21  Yes [provider]  diphenoxylate-atropine (LOMOTIL) 2.5-0.025 MG tablet Take 1 tablet by mouth 3 (three) times daily as needed for diarrhea or loose stools.  05/31/19  Yes [provider]  furosemide (LASIX) 40 MG tablet Take 40 mg by mouth daily.  12/25/16  Yes [provider]  insulin aspart (NOVOLOG) 100 UNIT/ML injection Inject 0-16 Units into the skin. Pt on insulin pump.  Total of 16 units per day.   Yes [provider]  methenamine (HIPREX) 1 g tablet Take 1 g by mouth 2 (two) times daily with a meal.   Yes [provider]  Multiple Vitamins-Minerals (WOMENS MULTIVITAMIN PO) Take by mouth.   Yes [provider]  mycophenolate (MYFORTIC) 180 MG EC tablet Take 540 mg by mouth 2 (two) times daily. ('540mg'$ ) in the morning and bedtime-( 3 capsules )   Yes [provider]  pantoprazole (PROTONIX) 40 MG tablet Take 40 mg by mouth at bedtime.    Yes [provider]  rosuvastatin (CRESTOR) 10 MG tablet Take 1 tablet by mouth once daily  11/13/21  Yes Adrian Prows, MD  tacrolimus (PROGRAF) 1 MG capsule Take 2 mg by mouth 2 (two) times daily.    Yes [provider]  albuterol (ACCUNEB) 0.63 MG/3ML nebulizer solution Take 1 ampule by nebulization every 6 (six) hours as needed for wheezing or shortness of breath.  11/07/18   [provider]  albuterol (PROVENTIL HFA;VENTOLIN HFA) 108 (90 BASE) MCG/ACT inhaler Inhale 1 puff into the lungs every 6 (six) hours as needed for wheezing or shortness of breath.    [provider]  amoxicillin-clavulanate (AUGMENTIN) 875-125 MG tablet Take 1 tablet by mouth  every 12 (twelve) hours. 06/22/22   Hans Eden, NP  aspirin 81 MG chewable tablet Chew 1 tablet (81 mg total) by mouth daily. 12/09/20   Adrian Prows, MD  benzonatate (TESSALON) 100 MG capsule Take 1 capsule (100 mg total) by mouth every 8 (eight) hours. 06/22/22   White, Leitha Schuller, NP  fluticasone (FLONASE) 50 MCG/ACT nasal spray Place 2 sprays into both nostrils daily as needed for allergies. 06/22/22   White, Leitha Schuller, NP  Insulin Disposable Pump (OMNIPOD 5 G6 POD, GEN 5,) MISC Use as directed. 07/21/22   Talbot Grumbling, FNP  Insulin Pen Needle (NOVOFINE) 30G X 8 MM MISC Inject 10 each into the skin as needed. 07/21/22   Talbot Grumbling, FNP  labetalol (NORMODYNE) 100 MG tablet Take 1 tablet (100 mg total) by mouth 2 (two) times daily. 06/05/20   Adrian Prows, MD  promethazine-dextromethorphan (PROMETHAZINE-DM) 6.25-15 MG/5ML syrup Take 5 mLs by mouth 4 (four) times daily as needed for cough. 06/22/22   Hans Eden, NP    Family History Family History  Problem Relation Age of Onset   Malignant hyperthermia Mother    Thyroid disease Mother    Hypertension Mother    Malignant hyperthermia Father    Hypertension Father    Diabetes Brother    Anesthesia problems Neg Hx    Colon cancer Neg Hx    Esophageal cancer Neg Hx    Colon polyps Neg Hx    Rectal cancer Neg Hx    Stomach cancer Neg Hx      Social History Social History   Tobacco Use   Smoking status: Never   Smokeless tobacco: Never  Vaping Use   Vaping Use: Never used  Substance Use Topics   Alcohol use: No   Drug use: No     Allergies   Patient has no known allergies.   Review of Systems Review of Systems Per HPI  Physical Exam Triage Vital Signs ED Triage Vitals  Enc Vitals Group     BP 07/21/22 1541 (!) 142/83     Pulse Rate 07/21/22 1541 98     Resp 07/21/22 1541 20     Temp 07/21/22 1541 98.1 F (36.7 C)     Temp Source 07/21/22 1541 Oral     SpO2 07/21/22 1541 94 %     Weight --      Height --      Head Circumference --      Peak Flow --      Pain Score 07/21/22 1544 0     Pain Loc --      Pain Edu? --      Excl. in [place]? --    No data found.  Updated Vital Signs BP (!) 142/83   Pulse 98   Temp 98.1 F (36.7 C) (Oral)   Resp 20   LMP 07/02/2022 (Approximate)   SpO2 94%   Visual Acuity Right Eye Distance:   Left Eye Distance:   Bilateral Distance:    Right Eye Near:   Left Eye Near:    Bilateral Near:     Physical Exam Vitals and nursing note reviewed.  Constitutional:      Appearance: She is not ill-appearing or toxic-appearing.  HENT:     Head: Normocephalic and atraumatic.     Right Ear: Hearing, tympanic membrane, ear canal and external ear normal.     Left Ear: Hearing, tympanic membrane, ear canal and external ear normal.  Nose: Nose normal. No congestion.     Mouth/Throat:     Lips: Pink.     Mouth: Mucous membranes are moist.     Pharynx: No posterior oropharyngeal erythema.  Eyes:     General: Lids are normal. Vision grossly intact. Gaze aligned appropriately.     Extraocular Movements: Extraocular movements intact.     Conjunctiva/sclera: Conjunctivae normal.  Cardiovascular:     Rate and Rhythm: Normal rate and regular rhythm.     Heart sounds: Normal heart sounds, S1 normal and S2 normal.  Pulmonary:     Effort: Pulmonary effort is normal. No  respiratory distress.     Breath sounds: Normal breath sounds and air entry.  Abdominal:     General: Bowel sounds are normal.     Palpations: Abdomen is soft.     Tenderness: There is no abdominal tenderness. There is no right CVA tenderness, left CVA tenderness or guarding.  Musculoskeletal:     Cervical back: Neck supple.  Lymphadenopathy:     Cervical: No cervical adenopathy.  Skin:    General: Skin is warm and dry.     Capillary Refill: Capillary refill takes less than 2 seconds.     Findings: No rash.  Neurological:     General: No focal deficit present.     Mental Status: She is alert and oriented to person, place, and time. Mental status is at baseline.     Cranial Nerves: No dysarthria or facial asymmetry.  Psychiatric:        Mood and Affect: Mood normal.        Speech: Speech normal.        Behavior: Behavior normal.        Thought Content: Thought content normal.        Judgment: Judgment normal.      UC Treatments / Results  Labs (all labs ordered are listed, but only abnormal results are displayed) Labs Reviewed  POCT URINALYSIS DIPSTICK, ED / UC - Abnormal; Notable for the following components:      Result Value   Glucose, UA 100 (*)    Ketones, ur TRACE (*)    All other components within normal limits  CBG MONITORING, ED - Abnormal; Notable for the following components:   Glucose-Capillary 343 (*)    All other components within normal limits    EKG   Radiology No results found.  Procedures Procedures (including critical care time)  Medications Ordered in UC Medications - No data to display  Initial Impression / Assessment and Plan / UC Course  I have reviewed the triage vital signs and the nursing notes.  Pertinent labs & imaging results that were available during my care of the patient were reviewed by me and considered in my medical decision making (see chart for details).   1. Type 2 diabetes mellitus with hyperglycemia with long-term  current use of insulin, medication refill Appropriate Omnipod for insulin pump sent to pharmacy in [place], [place] as they are the only pharmacy to carry correct type of Omnipod for patient's pump. This medicine will be shipped to her home as usual. Confirmed endocrinologist sent flex pen to the pharmacy, I sent needles so that patient is able to use the pen. Discussed rotating sites and appropriate cleaning technique prior to insulin injection. She has used an insulin pen before but states this was many years ago. Advised to ask the pharmacist to show her exactly how to use it when she gets there since  we do not have access to a flex pen in the clinic for demonstration. Reviewed signs/symptoms of hypoglycemia. Patient is well-versed in calculating carbs and determining how much insulin she needs based on continuous blood glucose monitor and may use the app on her phone to dose her insulin appropriately. Encouraged strict close follow-up with endocrinology tomorrow for ongoing management of diabetes while waiting for Omnipod delivery for automated insulin pump. She expresses understanding and agreement with plan. She is nontoxic in appearance, appears well hydrated, and is not exhibiting any clinical manifestations of hyperglycemia. Last hemoglobin A1C documented was 7.8. in August 2023. Urinalysis negative for evidence of UTI. CBG in clinic 343 and consistent with reading of patient's continuous monitor.   Discussed physical exam and available lab work findings in clinic with patient.  Counseled patient regarding appropriate use of medications and potential side effects for all medications recommended or prescribed today. Discussed red flag signs and symptoms of worsening condition,when to call the PCP office, return to urgent care, and when to seek higher level of care in the emergency department. Patient verbalizes understanding and agreement with plan. All questions answered. Patient discharged in stable  condition.    Final Clinical Impressions(s) / UC Diagnoses   Final diagnoses:  Type 2 diabetes mellitus with hyperglycemia, with long-term current use of insulin [facility])     Discharge Instructions      I have refilled your OmniPod and I have sent insulin pen needles to the pharmacy for you.  Use the app on your phone to determine how much insulin to administer on your regular schedule based on your blood sugar and the amount of carbs that you have eaten.  Call your endocrinologist for further information on how to use your insulin pen.  Schedule an appointment with your endocrinologist for follow-up this week.  If you develop any new or worsening symptoms or do not improve in the next 2 to 3 days, please return.  If your symptoms are severe, please go to the emergency room.  Follow-up with your primary care provider for further evaluation and management of your symptoms as well as ongoing wellness visits.  I hope you feel better!   ED Prescriptions     Medication Sig Dispense Auth. Provider   Insulin Disposable Pump (OMNIPOD 5 G6 POD, GEN 5,) MISC  (Status: Discontinued) Use as directed. 1 each Talbot Grumbling, FNP   Insulin Disposable Pump (OMNIPOD 5 G6 POD, GEN 5,) MISC Use as directed. 1 each Talbot Grumbling, FNP   Insulin Pen Needle (NOVOFINE) 30G X 8 MM MISC  (Status: Discontinued) Inject 10 each into the skin as needed. 5 each Talbot Grumbling, FNP   Insulin Pen Needle (NOVOFINE) 30G X 8 MM MISC Inject 10 each into the skin as needed. 5 each Talbot Grumbling, FNP      PDMP not reviewed this encounter.   Talbot Grumbling, [place] 07/23/22 2107

## 2022-07-28 ENCOUNTER — Other Ambulatory Visit: Payer: Self-pay | Admitting: Cardiology

## 2022-08-07 DIAGNOSIS — L851 Acquired keratosis [keratoderma] palmaris et plantaris: Secondary | ICD-10-CM | POA: Diagnosis not present

## 2022-08-07 DIAGNOSIS — M2042 Other hammer toe(s) (acquired), left foot: Secondary | ICD-10-CM | POA: Diagnosis not present

## 2022-08-07 DIAGNOSIS — B351 Tinea unguium: Secondary | ICD-10-CM | POA: Diagnosis not present

## 2022-08-07 DIAGNOSIS — I739 Peripheral vascular disease, unspecified: Secondary | ICD-10-CM | POA: Diagnosis not present

## 2022-08-11 DIAGNOSIS — Z794 Long term (current) use of insulin: Secondary | ICD-10-CM | POA: Diagnosis not present

## 2022-08-11 DIAGNOSIS — E1165 Type 2 diabetes mellitus with hyperglycemia: Secondary | ICD-10-CM | POA: Diagnosis not present

## 2022-08-28 ENCOUNTER — Other Ambulatory Visit: Payer: Self-pay | Admitting: Cardiology

## 2022-08-30 DIAGNOSIS — E1165 Type 2 diabetes mellitus with hyperglycemia: Secondary | ICD-10-CM | POA: Diagnosis not present

## 2022-09-10 DIAGNOSIS — Z794 Long term (current) use of insulin: Secondary | ICD-10-CM | POA: Diagnosis not present

## 2022-09-10 DIAGNOSIS — E1165 Type 2 diabetes mellitus with hyperglycemia: Secondary | ICD-10-CM | POA: Diagnosis not present

## 2022-09-23 DIAGNOSIS — I129 Hypertensive chronic kidney disease with stage 1 through stage 4 chronic kidney disease, or unspecified chronic kidney disease: Secondary | ICD-10-CM | POA: Diagnosis not present

## 2022-09-23 DIAGNOSIS — N2581 Secondary hyperparathyroidism of renal origin: Secondary | ICD-10-CM | POA: Diagnosis not present

## 2022-09-23 DIAGNOSIS — Z940 Kidney transplant status: Secondary | ICD-10-CM | POA: Diagnosis not present

## 2022-09-23 DIAGNOSIS — N182 Chronic kidney disease, stage 2 (mild): Secondary | ICD-10-CM | POA: Diagnosis not present

## 2022-09-23 DIAGNOSIS — D631 Anemia in chronic kidney disease: Secondary | ICD-10-CM | POA: Diagnosis not present

## 2022-09-24 ENCOUNTER — Other Ambulatory Visit: Payer: Self-pay | Admitting: Cardiology

## 2022-09-27 ENCOUNTER — Encounter (HOSPITAL_COMMUNITY): Payer: Self-pay | Admitting: *Deleted

## 2022-09-27 DIAGNOSIS — Z0000 Encounter for general adult medical examination without abnormal findings: Secondary | ICD-10-CM | POA: Diagnosis not present

## 2022-09-27 DIAGNOSIS — E1165 Type 2 diabetes mellitus with hyperglycemia: Secondary | ICD-10-CM | POA: Diagnosis not present

## 2022-09-27 DIAGNOSIS — I10 Essential (primary) hypertension: Secondary | ICD-10-CM | POA: Diagnosis not present

## 2022-09-27 DIAGNOSIS — Z794 Long term (current) use of insulin: Secondary | ICD-10-CM | POA: Diagnosis not present

## 2022-09-27 DIAGNOSIS — J302 Other seasonal allergic rhinitis: Secondary | ICD-10-CM | POA: Diagnosis not present

## 2022-09-27 DIAGNOSIS — K219 Gastro-esophageal reflux disease without esophagitis: Secondary | ICD-10-CM | POA: Diagnosis not present

## 2022-09-27 DIAGNOSIS — J4540 Moderate persistent asthma, uncomplicated: Secondary | ICD-10-CM | POA: Diagnosis not present

## 2022-09-27 DIAGNOSIS — E782 Mixed hyperlipidemia: Secondary | ICD-10-CM | POA: Diagnosis not present

## 2022-09-27 DIAGNOSIS — E559 Vitamin D deficiency, unspecified: Secondary | ICD-10-CM | POA: Diagnosis not present

## 2022-10-03 DIAGNOSIS — E0840 Diabetes mellitus due to underlying condition with diabetic neuropathy, unspecified: Secondary | ICD-10-CM | POA: Diagnosis not present

## 2022-10-03 DIAGNOSIS — E7800 Pure hypercholesterolemia, unspecified: Secondary | ICD-10-CM | POA: Diagnosis not present

## 2022-10-03 DIAGNOSIS — E1165 Type 2 diabetes mellitus with hyperglycemia: Secondary | ICD-10-CM | POA: Diagnosis not present

## 2022-10-03 DIAGNOSIS — Z940 Kidney transplant status: Secondary | ICD-10-CM | POA: Diagnosis not present

## 2022-10-03 DIAGNOSIS — I10 Essential (primary) hypertension: Secondary | ICD-10-CM | POA: Diagnosis not present

## 2022-10-03 DIAGNOSIS — Z9641 Presence of insulin pump (external) (internal): Secondary | ICD-10-CM | POA: Diagnosis not present

## 2022-10-03 DIAGNOSIS — R809 Proteinuria, unspecified: Secondary | ICD-10-CM | POA: Diagnosis not present

## 2022-10-07 ENCOUNTER — Encounter (HOSPITAL_COMMUNITY): Payer: Self-pay | Admitting: *Deleted

## 2022-10-10 DIAGNOSIS — Z794 Long term (current) use of insulin: Secondary | ICD-10-CM | POA: Diagnosis not present

## 2022-10-10 DIAGNOSIS — E1165 Type 2 diabetes mellitus with hyperglycemia: Secondary | ICD-10-CM | POA: Diagnosis not present

## 2022-10-14 ENCOUNTER — Ambulatory Visit: Payer: Managed Care, Other (non HMO) | Admitting: Cardiology

## 2022-10-14 ENCOUNTER — Encounter: Payer: Self-pay | Admitting: Cardiology

## 2022-10-14 VITALS — BP 207/92 | HR 99 | Resp 16 | Ht 60.0 in | Wt 174.6 lb

## 2022-10-14 DIAGNOSIS — I10 Essential (primary) hypertension: Secondary | ICD-10-CM

## 2022-10-14 DIAGNOSIS — I951 Orthostatic hypotension: Secondary | ICD-10-CM | POA: Diagnosis not present

## 2022-10-14 DIAGNOSIS — I739 Peripheral vascular disease, unspecified: Secondary | ICD-10-CM

## 2022-10-14 DIAGNOSIS — I6523 Occlusion and stenosis of bilateral carotid arteries: Secondary | ICD-10-CM | POA: Diagnosis not present

## 2022-10-14 MED ORDER — METOPROLOL SUCCINATE ER 50 MG PO TB24: 50.0000 mg | ORAL_TABLET | Freq: Every evening | ORAL | 2 refills | Status: DC

## 2022-10-14 NOTE — Progress Notes (Signed)
Primary Physician/Referring:  Jackie Plum, MD  Patient ID: [name], female    DOB: 19-Apr-1971, 52 y.o.   MRN: [id number]  Chief Complaint  Patient presents with   Hypertension   HPI:    [name]  is a 52 y.o.  with renal transplantation due to diabetic and hypertensive renal disease on 01/23/2013 at [facility] and followed by Dr. Zetta Bills. She has Essential HTN, orthostatic hypotension due to diabetic neuropathy, diabetic retinopathy and severe PAD with right common iliac artery stenting on 09/19/2015, again on 10/06/2015 had stenting of the right external iliac artery but eventually underwent RAKA in 2017 and walks with prosthesis. She has had left SFA angioplasty in Feb 2019 by Dr. Wyn Quaker in [place].  This is annual visit and presents here for follow-up of peripheral artery disease and carotid disease.  States that she has not had any frequent UTI as she previously used to have leading to severe hypotension and syncope.  She still continues to have mild orthostatic hypotension.  Diabetes is much improved.  States that she just started taking Mounjaro couple weeks ago.  She remains asymptomatic otherwise without symptoms of claudication, chest pain, palpitations, dizziness or syncope.   Past Medical History:  Diagnosis Date   Anemia    Below knee amputation (HCC)    RIGHT   CHF (congestive heart failure) (HCC)    PT UNAWARE OF THIS DX   Chronic kidney disease    PAST HX OF ESRD- HAD KIDNEY TRANSPLANT 01-24-2013   Critical lower limb ischemia (HCC) 10/05/2015   Enlarged heart    WITH IRREGULAR HEART RATE   GERD (gastroesophageal reflux disease)    High cholesterol    History of blood transfusion    related to "kidney transplant"   Hypercholesteremia 08/14/2006   Qualifier: Diagnosis of  By: Abundio Miu     Hypertension    Kidney transplant recipient    01/24/2013   Mechanical complication of other vascular device, implant, and graft 09/22/2012    Metabolic bone disease    Orthostatic hypotension    PAD (peripheral artery disease) (HCC)    PT UNAWARE OF THIS DIAGNOSIS   Pericardial effusion    Retinopathy    Type II diabetes mellitus (HCC)    "controlled with diet"    Social History   Tobacco Use   Smoking status: Never   Smokeless tobacco: Never  Substance Use Topics   Alcohol use: No   Marital Status: Single   ROS  Review of Systems  Cardiovascular:  Negative for chest pain, claudication, dyspnea on exertion and leg swelling.  Gastrointestinal:  Negative for melena.  Neurological:  Positive for dizziness (occasional).   Objective   Blood pressure (!) 207/92, pulse 99, resp. rate 16, height 5' (1.524 m), weight 174 lb 9.6 oz (79.2 kg), SpO2 98 %. Body mass index is 34.1 kg/m.      10/14/2022    3:33 PM 07/21/2022    3:41 PM 06/22/2022   12:48 PM  Vitals with BMI  Height 5\' 0"     Weight 174 lbs 10 oz    BMI 34.1    Systolic 207 142 409  Diastolic 92 83 78  Pulse 99 98 104    Orthostatic VS for the past 72 hrs (Last 3 readings):  Orthostatic BP Patient Position BP Location Cuff Size Orthostatic Pulse  10/14/22 1544 178/79 Standing Left Arm Normal 102  10/14/22 1542 (!) 227/94 Sitting Left Arm Normal 97  10/14/22  1541 (!) 244/107 Supine Left Arm Normal 94      Physical Exam Vitals reviewed.  Constitutional:      Comments: She is shortened stager and moderately obese in no acute distress.  Neck:     Vascular: Carotid bruit (bilateral) present. No JVD.  Cardiovascular:     Rate and Rhythm: Normal rate and regular rhythm.     Pulses: Intact distal pulses.          Femoral pulses are  on the right side with bruit and  on the left side with bruit.      Popliteal pulses are 2+ on the left side.       Dorsalis pedis pulses are 1+ on the left side.       Posterior tibial pulses are 0 on the left side.     Heart sounds: Normal heart sounds, S1 normal and S2 normal.     Midsystolic murmur is present. Aortic Area      No gallop.     Comments: Right BKA noted. Right arm A-V dialysis fistula noted.  Pulmonary:     Effort: Pulmonary effort is normal. No accessory muscle usage or respiratory distress.     Breath sounds: Normal breath sounds.  Abdominal:     General: Bowel sounds are normal.     Palpations: Abdomen is soft.     Comments: Epigastric bruit present, No prominent abdominal aortic pulsation.  Musculoskeletal:        General: Deformity (right BKA) present. No swelling.  Skin:    General: Skin is warm.     Capillary Refill: Right lower extremity capillary refill < 2 Sec    Comments: Lower Extremity Inspection - Left - Loss of hair and Shiny atrophic skin, No Ulcerations. Right - Inspection Normal (Right below knee amputation, stump has healed well). Palpation - Temperature - Left - Cool. Right - Normal.    Radiology: No results found.  Laboratory examination:   Lipoprotein (a) 06/03/2019 <75.0 nmol/L <8.4   External labs:  Labs 07/05/2022:  Serum glucose 161 mg, BUN 17, creatinine 0.87, EGFR 80 mL, potassium 3.9.  A1c 7.0%.  TSH normal at 4.160.  Total cholesterol 162, triglycerides 87, HDL 83, LDL 63.  Cardiac Studies:   Echocardiogram 03/30/2011: Reveals normal LV systolic function. Mild diastolic dysfunction. Trivial pericardial effusion.LVH.    [employer] study, Renal duplex 09/07/2013: Patent transplant vasculature, resistive indicis are increasing. The renal artery velocity at the anastomosis has increased from prior study, However this may indicate elevated velocity in the proximal iliac artery rather than presence of true stenosis.  PV Angio  10/06/2015: S/P Right EIA 6x39 and 6x60 mm balloon expandable and self expanding stent, 90% to 0%. Right SFA 85% to 0% with 5x80 mm Self expanding stent. Two vessel r/o with AT and diffusely disease Peroneal. No runoff at the ankle. Filled by faint collaterals. Right CIA stent placed 09/19/15, 6x14 mm stent patent.  Carotid  artery duplex 10/29/2016: No hemodynamically significant arterial disease in the internal carotid artery bilaterally. No significnat plaque burden noted bilaterally. Antegrade right vertebral artery flow. Antegrade left vertebral artery flow. No significant change from Carotid duplex 08/20/12.  Peripheral arteriogram 08/12/2016: Percutaneous transluminal angioplasty of the entire left SFA and proximal popliteal artery with 4 mm diameter Lutonix drug-coated angioplasty balloons   EKG:  10/14/2022: Normal sinus rhythm at the rate of 95 bpm, left atrial enlargement, no evidence of ischemia.  Compared to 12/07/2020, no significant change.  Medications and allergies  No Known Allergies  Current Outpatient Medications:    albuterol (ACCUNEB) 0.63 MG/3ML nebulizer solution, Take 1 ampule by nebulization every 6 (six) hours as needed for wheezing or shortness of breath. , Disp: , Rfl:    albuterol (PROVENTIL HFA;VENTOLIN HFA) 108 (90 BASE) MCG/ACT inhaler, Inhale 1 puff into the lungs every 6 (six) hours as needed for wheezing or shortness of breath., Disp: , Rfl:    amLODipine (NORVASC) 10 MG tablet, Take 0.5 tablets (5 mg total) by mouth 2 (two) times daily. If sitting BP >140/80 mm Hg (Patient taking differently: Take 10 mg by mouth every evening.), Disp: 90 tablet, Rfl: 3   Continuous Blood Gluc Receiver (DEXCOM G6 RECEIVER) DEVI, See admin instructions., Disp: , Rfl:    Continuous Blood Gluc Sensor (DEXCOM G6 SENSOR) MISC, See admin instructions., Disp: , Rfl:    Continuous Blood Gluc Transmit (DEXCOM G6 TRANSMITTER) MISC, See admin instructions., Disp: , Rfl:    diphenoxylate-atropine (LOMOTIL) 2.5-0.025 MG tablet, Take 1 tablet by mouth 3 (three) times daily as needed for diarrhea or loose stools. , Disp: , Rfl:    fluticasone (FLONASE) 50 MCG/ACT nasal spray, Place 2 sprays into both nostrils daily as needed for allergies., Disp: 11.1 mL, Rfl: 0   furosemide (LASIX) 40 MG tablet, Take 40 mg by mouth  daily. , Disp: , Rfl:    Insulin Disposable Pump (OMNIPOD 5 G6 POD, GEN 5,) MISC, Use as directed., Disp: 1 each, Rfl: 1   Insulin Pen Needle (NOVOFINE) 30G X 8 MM MISC, Inject 10 each into the skin as needed., Disp: 5 each, Rfl: 0   methenamine (HIPREX) 1 g tablet, Take 1 g by mouth 2 (two) times daily with a meal., Disp: , Rfl:    metoprolol succinate (TOPROL-XL) 50 MG 24 hr tablet, Take 1 tablet (50 mg total) by mouth every evening. Take with or immediately following a meal., Disp: 30 tablet, Rfl: 2   Multiple Vitamins-Minerals (WOMENS MULTIVITAMIN PO), Take by mouth., Disp: , Rfl:    mycophenolate (MYFORTIC) 180 MG EC tablet, Take 540 mg by mouth 2 (two) times daily. (540mg ) in the morning and bedtime-( 3 capsules ), Disp: , Rfl:    pantoprazole (PROTONIX) 40 MG tablet, Take 40 mg by mouth at bedtime. , Disp: , Rfl:    rosuvastatin (CRESTOR) 10 MG tablet, TAKE 1 TABLET BY MOUTH ONCE DAILY . APPOINTMENT REQUIRED FOR FUTURE REFILLS, Disp: 90 tablet, Rfl: 3   tacrolimus (PROGRAF) 1 MG capsule, Take 2 mg by mouth 2 (two) times daily. , Disp: , Rfl:    tirzepatide (MOUNJARO) 2.5 MG/0.5ML Pen, Inject 2.5 mg into the skin once a week., Disp: , Rfl:    Assessment     ICD-10-CM   1. Asymptomatic bilateral carotid artery stenosis  I65.23 PCV CAROTID DUPLEX (BILATERAL)    2. Peripheral artery disease (HCC)  I73.9 EKG 12-Lead    3. Supine hypertension  I10 metoprolol succinate (TOPROL-XL) 50 MG 24 hr tablet    4. Orthostatic hypotension  I95.1       Meds ordered this encounter  Medications   metoprolol succinate (TOPROL-XL) 50 MG 24 hr tablet    Sig: Take 1 tablet (50 mg total) by mouth every evening. Take with or immediately following a meal.    Dispense:  30 tablet    Refill:  2   Medications Discontinued During This Encounter  Medication Reason   amoxicillin-clavulanate (AUGMENTIN) 875-125 MG tablet Completed Course   aspirin 81 MG chewable  tablet    benzonatate (TESSALON) 100 MG  capsule    insulin aspart (NOVOLOG) 100 UNIT/ML injection    labetalol (NORMODYNE) 100 MG tablet    promethazine-dextromethorphan (PROMETHAZINE-DM) 6.25-15 MG/5ML syrup      Recommendations:   [name]  is a 52 y.o. with renal transplantation due to diabetic and hypertensive renal disease on 01/23/2013 at [facility] and followed by Dr. Zetta Bills. She has Essential HTN, orthostatic hypotension due to diabetic neuropathy, diabetic retinopathy and severe PAD with right common iliac artery stenting on 09/19/2015, again on 10/06/2015 had stenting of the right external iliac artery but eventually underwent RAKA in 2017 and walks with prosthesis. She has had left SFA angioplasty in Feb 2019 by Dr. Wyn Quaker in [place].  1. Asymptomatic bilateral carotid artery stenosis This is annual visit, she needs carotid artery duplex.  No change in physical exam.  Remains asymptomatic without TIA. - PCV CAROTID DUPLEX (BILATERAL); Future  2. Peripheral artery disease (HCC) Symptoms of claudication have remained stable, she has been walking without any limitations.  She is trying her best to lose weight. - EKG 12-Lead  3. Supine hypertension Patient has severe supine hypertension but also marked orthostatic hypotension.  She is presently on amlodipine 10 mg in the evening, will add metoprolol succinate 50 mg in the evening as well.  I have discussed with her regarding sleeping reclined.  - metoprolol succinate (TOPROL-XL) 50 MG 24 hr tablet; Take 1 tablet (50 mg total) by mouth every evening. Take with or immediately following a meal.  Dispense: 30 tablet; Refill: 2  4. Orthostatic hypotension She is profoundly orthostatic.  She is asymptomatic as her blood pressure is markedly elevated.  Would like to see her back in 3 months for follow-up.  I suspect with initiation of Mounjaro she will lose weight and blood pressure may also improve and weight may have to watch out for orthostatic  symptoms.   Yates Decamp, MD, [facility] 10/14/2022, 10:16 PM Office: [phone]

## 2022-11-05 ENCOUNTER — Encounter (HOSPITAL_COMMUNITY): Payer: Self-pay | Admitting: *Deleted

## 2022-11-13 ENCOUNTER — Other Ambulatory Visit: Payer: Managed Care, Other (non HMO)

## 2022-11-20 ENCOUNTER — Encounter (HOSPITAL_COMMUNITY): Payer: Self-pay | Admitting: *Deleted

## 2022-11-26 ENCOUNTER — Ambulatory Visit (INDEPENDENT_AMBULATORY_CARE_PROVIDER_SITE_OTHER): Payer: Medicare Other | Admitting: Nurse Practitioner

## 2022-11-26 ENCOUNTER — Encounter (INDEPENDENT_AMBULATORY_CARE_PROVIDER_SITE_OTHER): Payer: Medicare Other

## 2022-12-05 ENCOUNTER — Encounter (HOSPITAL_COMMUNITY): Payer: Self-pay | Admitting: *Deleted

## 2022-12-09 ENCOUNTER — Other Ambulatory Visit (INDEPENDENT_AMBULATORY_CARE_PROVIDER_SITE_OTHER): Payer: Self-pay | Admitting: Nurse Practitioner

## 2022-12-09 DIAGNOSIS — I739 Peripheral vascular disease, unspecified: Secondary | ICD-10-CM

## 2022-12-09 DIAGNOSIS — N186 End stage renal disease: Secondary | ICD-10-CM

## 2022-12-10 ENCOUNTER — Encounter (HOSPITAL_COMMUNITY): Payer: Self-pay | Admitting: *Deleted

## 2022-12-12 ENCOUNTER — Encounter (HOSPITAL_COMMUNITY): Payer: Self-pay | Admitting: *Deleted

## 2022-12-12 ENCOUNTER — Ambulatory Visit: Payer: Managed Care, Other (non HMO)

## 2022-12-12 DIAGNOSIS — I6523 Occlusion and stenosis of bilateral carotid arteries: Secondary | ICD-10-CM

## 2022-12-17 ENCOUNTER — Encounter (HOSPITAL_COMMUNITY): Payer: Self-pay | Admitting: *Deleted

## 2022-12-17 ENCOUNTER — Ambulatory Visit (INDEPENDENT_AMBULATORY_CARE_PROVIDER_SITE_OTHER): Payer: Managed Care, Other (non HMO)

## 2022-12-17 ENCOUNTER — Ambulatory Visit (INDEPENDENT_AMBULATORY_CARE_PROVIDER_SITE_OTHER): Payer: Medicare Other | Admitting: Vascular Surgery

## 2022-12-17 DIAGNOSIS — I739 Peripheral vascular disease, unspecified: Secondary | ICD-10-CM | POA: Diagnosis not present

## 2022-12-17 DIAGNOSIS — N186 End stage renal disease: Secondary | ICD-10-CM

## 2022-12-18 LAB — VAS US ABI WITH/WO TBI: Left ABI: 1.05

## 2022-12-24 ENCOUNTER — Ambulatory Visit (INDEPENDENT_AMBULATORY_CARE_PROVIDER_SITE_OTHER): Payer: Medicare Other | Admitting: Vascular Surgery

## 2022-12-24 ENCOUNTER — Encounter (INDEPENDENT_AMBULATORY_CARE_PROVIDER_SITE_OTHER): Payer: Medicare Other

## 2022-12-24 ENCOUNTER — Other Ambulatory Visit (INDEPENDENT_AMBULATORY_CARE_PROVIDER_SITE_OTHER): Payer: Medicare Other

## 2023-01-05 ENCOUNTER — Other Ambulatory Visit: Payer: Self-pay | Admitting: Cardiology

## 2023-01-05 DIAGNOSIS — I10 Essential (primary) hypertension: Secondary | ICD-10-CM

## 2023-01-13 ENCOUNTER — Ambulatory Visit: Payer: Managed Care, Other (non HMO) | Admitting: Cardiology

## 2023-01-15 ENCOUNTER — Other Ambulatory Visit: Payer: Self-pay | Admitting: Endocrinology

## 2023-01-15 DIAGNOSIS — E049 Nontoxic goiter, unspecified: Secondary | ICD-10-CM

## 2023-01-23 ENCOUNTER — Ambulatory Visit
Admission: RE | Admit: 2023-01-23 | Discharge: 2023-01-23 | Disposition: A | Discharge status: Home / Self Care | Payer: Managed Care, Other (non HMO) | Source: Ambulatory Visit | Attending: Endocrinology | Admitting: Endocrinology

## 2023-01-23 ENCOUNTER — Encounter (HOSPITAL_COMMUNITY): Payer: Self-pay | Admitting: *Deleted

## 2023-01-23 DIAGNOSIS — E049 Nontoxic goiter, unspecified: Secondary | ICD-10-CM

## 2023-02-12 ENCOUNTER — Other Ambulatory Visit: Payer: Self-pay | Admitting: Cardiology

## 2023-02-12 DIAGNOSIS — I10 Essential (primary) hypertension: Secondary | ICD-10-CM

## 2023-02-26 ENCOUNTER — Ambulatory Visit: Payer: Managed Care, Other (non HMO) | Admitting: Cardiology

## 2023-02-26 ENCOUNTER — Encounter: Payer: Self-pay | Admitting: Cardiology

## 2023-02-26 VITALS — BP 184/84 | HR 92 | Resp 16 | Ht 60.0 in | Wt 176.0 lb

## 2023-02-26 DIAGNOSIS — Z940 Kidney transplant status: Secondary | ICD-10-CM

## 2023-02-26 DIAGNOSIS — I10 Essential (primary) hypertension: Secondary | ICD-10-CM

## 2023-02-26 DIAGNOSIS — I951 Orthostatic hypotension: Secondary | ICD-10-CM

## 2023-02-26 DIAGNOSIS — I1A0 Resistant hypertension: Secondary | ICD-10-CM

## 2023-02-26 MED ORDER — METOPROLOL SUCCINATE ER 50 MG PO TB24: 50.0000 mg | ORAL_TABLET | Freq: Every day | ORAL | 3 refills | Status: DC

## 2023-02-26 MED ORDER — CLONIDINE 0.2 MG/24HR TD PTWK: 0.2000 mg | MEDICATED_PATCH | TRANSDERMAL | 3 refills | Status: DC

## 2023-02-26 NOTE — Progress Notes (Signed)
Primary Physician/Referring:  Jackie Plum, MD  Patient ID: [name], female    DOB: [birth year], 52 y.o.   MRN: [id number]  Chief Complaint  Patient presents with   Carotid stenosis    ___   orthostatic hypotension with ortho stasis   HPI:    [name]  is a 52 y.o.  with renal transplantation due to diabetic and hypertensive renal disease on 01/23/2013 at [facility] and followed by Dr. Zetta Bills. She has Essential HTN, orthostatic hypotension due to diabetic neuropathy, diabetic retinopathy and severe PAD with right common iliac artery stenting on 09/19/2015, again on 10/06/2015 had stenting of the right external iliac artery but eventually underwent RAKA in 2017 and walks with prosthesis. She has had left SFA angioplasty in Feb 2019 by Dr. Wyn Quaker in [place].  This is a 6-week office visit, on her last office visit had started her on metoprolol succinate which she is tolerating.  She remains asymptomatic otherwise without symptoms of claudication, chest pain, palpitations, dizziness or syncope.   Past Medical History:  Diagnosis Date   Anemia    Below knee amputation (HCC)    RIGHT   CHF (congestive heart failure) (HCC)    PT UNAWARE OF THIS DX   Chronic kidney disease    PAST HX OF ESRD- HAD KIDNEY TRANSPLANT 01-24-2013   Critical lower limb ischemia (HCC) 10/05/2015   Enlarged heart    WITH IRREGULAR HEART RATE   GERD (gastroesophageal reflux disease)    High cholesterol    History of blood transfusion    related to "kidney transplant"   Hypercholesteremia 08/14/2006   Qualifier: Diagnosis of  By: Abundio Miu     Hypertension    Kidney transplant recipient    01/24/2013   Mechanical complication of other vascular device, implant, and graft 09/22/2012   Metabolic bone disease    Orthostatic hypotension    PAD (peripheral artery disease) (HCC)    PT UNAWARE OF THIS DIAGNOSIS   Pericardial effusion    Retinopathy    Type II diabetes mellitus  (HCC)    "controlled with diet"    Social History   Tobacco Use   Smoking status: Never   Smokeless tobacco: Never  Substance Use Topics   Alcohol use: No   Marital Status: Single   ROS  Review of Systems  Cardiovascular:  Negative for chest pain, claudication, dyspnea on exertion and leg swelling.  Gastrointestinal:  Negative for melena.  Neurological:  Positive for dizziness (occasional).   Objective   Blood pressure (!) 184/84, pulse 92, resp. rate 16, height 5' (1.524 m), weight 176 lb (79.8 kg), SpO2 98%. Body mass index is 34.37 kg/m.      02/26/2023    3:13 PM 10/14/2022    3:33 PM 07/21/2022    3:41 PM  Vitals with BMI  Height 5\' 0"  5\' 0"    Weight 176 lbs 174 lbs 10 oz   BMI 34.37 34.1   Systolic 184 207 409  Diastolic 84 92 83  Pulse 92 99 98    Orthostatic VS for the past 72 hrs (Last 3 readings):  Orthostatic BP Patient Position BP Location Cuff Size Orthostatic Pulse  02/26/23 1520 180/73 Standing Left Arm Large 89  02/26/23 1518 (!) 204/76 Sitting Left Arm Large 88  02/26/23 1516 (!) 208/75 Supine Left Arm Large 86      Physical Exam Vitals reviewed.  Constitutional:      Comments: She is shortened stager and  moderately obese in no acute distress.  Neck:     Vascular: Carotid bruit (bilateral) present. No JVD.  Cardiovascular:     Rate and Rhythm: Normal rate and regular rhythm.     Pulses: Intact distal pulses.          Femoral pulses are  on the right side with bruit and  on the left side with bruit.      Popliteal pulses are 2+ on the left side.       Dorsalis pedis pulses are 1+ on the left side.       Posterior tibial pulses are 0 on the left side.     Heart sounds: Normal heart sounds, S1 normal and S2 normal.     Midsystolic murmur is present. Aortic Area     No gallop.     Comments: Right BKA noted. Right arm A-V dialysis fistula noted.  Pulmonary:     Effort: Pulmonary effort is normal. No accessory muscle usage or respiratory distress.      Breath sounds: Normal breath sounds.  Abdominal:     General: Bowel sounds are normal.     Palpations: Abdomen is soft.     Comments: Epigastric bruit present, No prominent abdominal aortic pulsation.  Musculoskeletal:        General: Deformity (right BKA) present. No swelling.  Skin:    General: Skin is warm.     Capillary Refill: Right lower extremity capillary refill < 2 Sec    Comments: Lower Extremity Inspection - Left - Loss of hair and Shiny atrophic skin, No Ulcerations. Right - Inspection Normal (Right below knee amputation, stump has healed well). Palpation - Temperature - Left - Cool. Right - Normal.    Radiology: No results found.  Laboratory examination:   Lipoprotein (a) 06/03/2019 <75.0 nmol/L <8.4   External labs:  Labs 07/05/2022:  Serum glucose 161 mg, BUN 17, creatinine 0.87, EGFR 80 mL, potassium 3.9.  A1c 7.0%.  TSH normal at 4.160.  Total cholesterol 162, triglycerides 87, HDL 83, LDL 63.  Cardiac Studies:   Echocardiogram 03/30/2011: Reveals normal LV systolic function. Mild diastolic dysfunction. Trivial pericardial effusion.LVH.    [employer] study, Renal duplex 09/07/2013: Patent transplant vasculature, resistive indicis are increasing. The renal artery velocity at the anastomosis has increased from prior study, However this may indicate elevated velocity in the proximal iliac artery rather than presence of true stenosis.  PV Angio  10/06/2015: S/P Right EIA 6x39 and 6x60 mm balloon expandable and self expanding stent, 90% to 0%. Right SFA 85% to 0% with 5x80 mm Self expanding stent. Two vessel r/o with AT and diffusely disease Peroneal. No runoff at the ankle. Filled by faint collaterals. Right CIA stent placed 09/19/15, 6x14 mm stent patent.  Peripheral arteriogram 08/12/2016: Percutaneous transluminal angioplasty of the entire left SFA and proximal popliteal artery with 4 mm diameter Lutonix drug-coated angioplasty balloons   Carotid  artery duplex 12/12/2022: Duplex suggests stenosis in the right internal carotid artery (1-15%). <50%stenosis in the right external carotid artery. Duplex suggests stenosis in the left internal carotid artery (1-15%). <50% stenosis in the left external carotid artery. Antegrade right vertebral artery flow. Antegrade left vertebral artery flow. Compared to 07/24/2021, left ICA stenosis of 16 to 49% not present.  There is mild homogenous plaque noted in bilateral ICA.  Follow-up studies if clinically indicated  EKG:  10/14/2022: Normal sinus rhythm at the rate of 95 bpm, left atrial enlargement, no evidence of ischemia.  Compared  to 12/07/2020, no significant change.  Medications and allergies  No Known Allergies  Current Outpatient Medications:    albuterol (ACCUNEB) 0.63 MG/3ML nebulizer solution, Take 1 ampule by nebulization every 6 (six) hours as needed for wheezing or shortness of breath. , Disp: , Rfl:    albuterol (PROVENTIL HFA;VENTOLIN HFA) 108 (90 BASE) MCG/ACT inhaler, Inhale 1 puff into the lungs every 6 (six) hours as needed for wheezing or shortness of breath., Disp: , Rfl:    amLODipine (NORVASC) 10 MG tablet, Take 0.5 tablets (5 mg total) by mouth 2 (two) times daily. If sitting BP >140/80 mm Hg (Patient taking differently: Take 10 mg by mouth every evening.), Disp: 90 tablet, Rfl: 3   cloNIDine (CATAPRES - DOSED IN MG/24 HR) 0.2 mg/24hr patch, Place 1 patch (0.2 mg total) onto the skin once a week., Disp: 4 patch, Rfl: 3   Continuous Blood Gluc Receiver (DEXCOM G6 RECEIVER) DEVI, See admin instructions., Disp: , Rfl:    Continuous Blood Gluc Sensor (DEXCOM G6 SENSOR) MISC, See admin instructions., Disp: , Rfl:    Continuous Blood Gluc Transmit (DEXCOM G6 TRANSMITTER) MISC, See admin instructions., Disp: , Rfl:    diphenoxylate-atropine (LOMOTIL) 2.5-0.025 MG tablet, Take 1 tablet by mouth 3 (three) times daily as needed for diarrhea or loose stools. , Disp: , Rfl:    fluticasone  (FLONASE) 50 MCG/ACT nasal spray, Place 2 sprays into both nostrils daily as needed for allergies., Disp: 11.1 mL, Rfl: 0   furosemide (LASIX) 40 MG tablet, Take 40 mg by mouth daily. , Disp: , Rfl:    Insulin Disposable Pump (OMNIPOD 5 G6 POD, GEN 5,) MISC, Use as directed., Disp: 1 each, Rfl: 1   Insulin Pen Needle (NOVOFINE) 30G X 8 MM MISC, Inject 10 each into the skin as needed., Disp: 5 each, Rfl: 0   methenamine (HIPREX) 1 g tablet, Take 1 g by mouth 2 (two) times daily with a meal., Disp: , Rfl:    Multiple Vitamins-Minerals (WOMENS MULTIVITAMIN PO), Take by mouth., Disp: , Rfl:    mycophenolate (MYFORTIC) 180 MG EC tablet, Take 540 mg by mouth 2 (two) times daily. (540mg ) in the morning and bedtime-( 3 capsules ), Disp: , Rfl:    pantoprazole (PROTONIX) 40 MG tablet, Take 40 mg by mouth at bedtime. , Disp: , Rfl:    rosuvastatin (CRESTOR) 10 MG tablet, TAKE 1 TABLET BY MOUTH ONCE DAILY . APPOINTMENT REQUIRED FOR FUTURE REFILLS, Disp: 90 tablet, Rfl: 3   tacrolimus (PROGRAF) 1 MG capsule, Take 2 mg by mouth 2 (two) times daily. , Disp: , Rfl:    tirzepatide (MOUNJARO) 2.5 MG/0.5ML Pen, Inject 2.5 mg into the skin once a week., Disp: , Rfl:    metoprolol succinate (TOPROL-XL) 50 MG 24 hr tablet, Take 1 tablet (50 mg total) by mouth daily. Take with or immediately following a meal., Disp: 90 tablet, Rfl: 3   Assessment     ICD-10-CM   1. Resistant hypertension  I1A.0     2. Supine hypertension  I10 cloNIDine (CATAPRES - DOSED IN MG/24 HR) 0.2 mg/24hr patch    metoprolol succinate (TOPROL-XL) 50 MG 24 hr tablet    3. Orthostatic hypotension  I95.1     4. Kidney transplant recipient  Z48.0        Meds ordered this encounter  Medications   cloNIDine (CATAPRES - DOSED IN MG/24 HR) 0.2 mg/24hr patch    Sig: Place 1 patch (0.2 mg total) onto the skin  once a week.    Dispense:  4 patch    Refill:  3   metoprolol succinate (TOPROL-XL) 50 MG 24 hr tablet    Sig: Take 1 tablet (50 mg  total) by mouth daily. Take with or immediately following a meal.    Dispense:  90 tablet    Refill:  3   Medications Discontinued During This Encounter  Medication Reason   metoprolol succinate (TOPROL-XL) 50 MG 24 hr tablet Reorder      Recommendations:   [name]  is a 52 y.o. with renal transplantation due to diabetic and hypertensive renal disease on 01/23/2013 at [facility] and followed by Dr. Zetta Bills. She has Essential HTN, orthostatic hypotension due to diabetic neuropathy, diabetic retinopathy and severe PAD with right common iliac artery stenting on 09/19/2015, again on 10/06/2015 had stenting of the right external iliac artery but eventually underwent RAKA in 2017 and walks with prosthesis. She has had left SFA angioplasty in Feb 2019 by Dr. Wyn Quaker in [place].  1. Resistant hypertension Patient continues to have resistant hypertension.  I have added clonidine patch, to see how she does over the next 3 months, if she does not have any significant orthostasis or symptoms of orthostatic hypotension, will continue the same.  Otherwise I may have to try BiDil.  To see whether we can control her blood pressure better.  Importance of standing blood pressure <140 mmHg discussed with the patient.  I also advised her to monitor her blood pressure at least on a weekly or every 10 days or so and to MyChart message me so I can manage her medications.  Other consideration for medication use will include minoxidil however I would like to avoid this at high dose due to side effects.  2. Supine hypertension Today although patient has supine hypertension, she was not significantly orthostatic. - cloNIDine (CATAPRES - DOSED IN MG/24 HR) 0.2 mg/24hr patch; Place 1 patch (0.2 mg total) onto the skin once a week.  Dispense: 4 patch; Refill: 3 - metoprolol succinate (TOPROL-XL) 50 MG 24 hr tablet; Take 1 tablet (50 mg total) by mouth daily. Take with or immediately following a meal.   Dispense: 90 tablet; Refill: 3  3. Orthostatic hypotension As dictated above.  She continues to have resident hypertension.  4. Kidney transplant recipient Patient is a transplant recipient and importance of blood pressure control again discussed with the patient.  Advised her that we should let her transplant team manage her hypertension if they feel it is indicated as long as the standing blood pressure is targeted to go off <130 mmHg if possible.  With regard to carotid stenosis, she has very mild disease, no further evaluation is indicated.  Otherwise she is on appropriate medical therapy including statins.  She has no started with [facility] with regard to diabetes mellitus and has lost about 8 pounds in weight.  I suspect weight loss will also help with blood pressure control as well.  I will see her back in 3 months with repeat orthostasis.   Yates Decamp, MD, [facility] 02/26/2023, 3:43 PM Office: [phone]

## 2023-03-14 ENCOUNTER — Ambulatory Visit: Payer: Managed Care, Other (non HMO) | Admitting: Gastroenterology

## 2023-05-19 ENCOUNTER — Encounter (HOSPITAL_COMMUNITY): Payer: Self-pay | Admitting: *Deleted

## 2023-05-19 ENCOUNTER — Telehealth: Payer: Self-pay | Admitting: Cardiology

## 2023-05-19 NOTE — Telephone Encounter (Signed)
Patient is requesting to speak with a nurse in regards to paper work for the [facility].

## 2023-05-19 NOTE — Telephone Encounter (Signed)
I spoke with patient.  She has medical review paperwork from [facility].  I told patient Dr Jacinto Halim could complete the cardiology section.  Patient will drop paperwork off in the office

## 2023-06-02 ENCOUNTER — Telehealth: Payer: Self-pay | Admitting: Cardiology

## 2023-06-02 NOTE — Telephone Encounter (Signed)
Paper Work Dropped Off: For license  Date: 06/02/2023  Location of paper:  Dr Jacinto Halim mailbox

## 2023-06-03 NOTE — Telephone Encounter (Signed)
Dr Jacinto Halim has completed paperwork.  I placed call to patient and left message to call office

## 2023-06-04 NOTE — Telephone Encounter (Signed)
Patient picked up paperwork today.

## 2023-06-13 ENCOUNTER — Ambulatory Visit: Payer: Managed Care, Other (non HMO) | Attending: Cardiology | Admitting: Cardiology

## 2023-06-13 NOTE — Progress Notes (Deleted)
Cardiology Office Note:  .   Date:  06/13/2023  ID:  [name], DOB 1971/02/22, MRN [id number] PCP: Jackie Plum, MD  [facility] Providers Cardiologist:  None { Click to update primary MD,subspecialty MD or APP then REFRESH:1}  History of Present Illness: .   [name] is a 52 y.o. with renal transplantation due to diabetic and hypertensive renal disease on 01/23/2013 at [facility] and followed by Dr. Zetta Bills. She has Essential HTN, orthostatic hypotension due to diabetic neuropathy, diabetic retinopathy and severe PAD with right common iliac artery stenting on 09/19/2015, again on 10/06/2015 had stenting of the right external iliac artery but eventually underwent RAKA in 2017 and walks with prosthesis. She has had left SFA angioplasty in Feb 2019 by Dr. Wyn Quaker in [place].   Discussed the use of AI scribe software for clinical note transcription with the patient, who gave verbal consent to proceed.  History of Present Illness            ROS  Labs    External Labs:  Labs 07/05/2022:   Serum glucose 161 mg, BUN 17, creatinine 0.87, EGFR 80 mL, potassium 3.9.   A1c 7.0%.  TSH normal at 4.160.   Total cholesterol 162, triglycerides 87, HDL 83, LDL 63.  Physical Exam:   VS:  There were no vitals taken for this visit.   Wt Readings from Last 3 Encounters:  02/26/23 176 lb (79.8 kg)  10/14/22 174 lb 9.6 oz (79.2 kg)  04/30/21 176 lb (79.8 kg)     Physical Exam  Studies Reviewed: .    Carotid artery duplex 12/12/2022: Duplex suggests stenosis in the right internal carotid artery (1-15%). <50%stenosis in the right external carotid artery. Duplex suggests stenosis in the left internal carotid artery (1-15%). <50% stenosis in the left external carotid artery. Antegrade right vertebral artery flow. Antegrade left vertebral artery flow. Compared to 07/24/2021, left ICA stenosis of 16 to 49% not present.  There is mild homogenous plaque noted in  bilateral ICA.  Follow-up studies if clinically indicated  ABI 12/17/2022:   EKG:         10/14/2022: Normal sinus rhythm at the rate of 95 bpm, left atrial enlargement, no evidence of ischemia.   Medications and allergies    No Known Allergies   Current Outpatient Medications:    albuterol (ACCUNEB) 0.63 MG/3ML nebulizer solution, Take 1 ampule by nebulization every 6 (six) hours as needed for wheezing or shortness of breath. , Disp: , Rfl:    albuterol (PROVENTIL HFA;VENTOLIN HFA) 108 (90 BASE) MCG/ACT inhaler, Inhale 1 puff into the lungs every 6 (six) hours as needed for wheezing or shortness of breath., Disp: , Rfl:    amLODipine (NORVASC) 10 MG tablet, Take 0.5 tablets (5 mg total) by mouth 2 (two) times daily. If sitting BP >140/80 mm Hg (Patient taking differently: Take 10 mg by mouth every evening.), Disp: 90 tablet, Rfl: 3   cloNIDine (CATAPRES - DOSED IN MG/24 HR) 0.2 mg/24hr patch, Place 1 patch (0.2 mg total) onto the skin once a week., Disp: 4 patch, Rfl: 3   Continuous Blood Gluc Receiver (DEXCOM G6 RECEIVER) DEVI, See admin instructions., Disp: , Rfl:    Continuous Blood Gluc Sensor (DEXCOM G6 SENSOR) MISC, See admin instructions., Disp: , Rfl:    Continuous Blood Gluc Transmit (DEXCOM G6 TRANSMITTER) MISC, See admin instructions., Disp: , Rfl:    diphenoxylate-atropine (LOMOTIL) 2.5-0.025 MG tablet, Take 1 tablet by mouth 3 (three)  times daily as needed for diarrhea or loose stools. , Disp: , Rfl:    fluticasone (FLONASE) 50 MCG/ACT nasal spray, Place 2 sprays into both nostrils daily as needed for allergies., Disp: 11.1 mL, Rfl: 0   furosemide (LASIX) 40 MG tablet, Take 40 mg by mouth daily. , Disp: , Rfl:    Insulin Disposable Pump (OMNIPOD 5 G6 POD, GEN 5,) MISC, Use as directed., Disp: 1 each, Rfl: 1   Insulin Pen Needle (NOVOFINE) 30G X 8 MM MISC, Inject 10 each into the skin as needed., Disp: 5 each, Rfl: 0   methenamine (HIPREX) 1 g tablet, Take 1 g by mouth 2  (two) times daily with a meal., Disp: , Rfl:    metoprolol succinate (TOPROL-XL) 50 MG 24 hr tablet, Take 1 tablet (50 mg total) by mouth daily. Take with or immediately following a meal., Disp: 90 tablet, Rfl: 3   Multiple Vitamins-Minerals (WOMENS MULTIVITAMIN PO), Take by mouth., Disp: , Rfl:    mycophenolate (MYFORTIC) 180 MG EC tablet, Take 540 mg by mouth 2 (two) times daily. (540mg ) in the morning and bedtime-( 3 capsules ), Disp: , Rfl:    pantoprazole (PROTONIX) 40 MG tablet, Take 40 mg by mouth at bedtime. , Disp: , Rfl:    rosuvastatin (CRESTOR) 10 MG tablet, TAKE 1 TABLET BY MOUTH ONCE DAILY . APPOINTMENT REQUIRED FOR FUTURE REFILLS, Disp: 90 tablet, Rfl: 3   tacrolimus (PROGRAF) 1 MG capsule, Take 2 mg by mouth 2 (two) times daily. , Disp: , Rfl:    tirzepatide (MOUNJARO) 2.5 MG/0.5ML Pen, Inject 2.5 mg into the skin once a week., Disp: , Rfl:    ASSESSMENT AND PLAN: .      ICD-10-CM   1. Supine hypertension  I10     2. Orthostatic hypotension  I95.1     3. Kidney transplant recipient  Z94.0     4. Peripheral artery disease (HCC)  I73.9     5. Asymptomatic bilateral carotid artery stenosis  I65.23       1. Supine hypertension ***  2. Orthostatic hypotension ***  3. Kidney transplant recipient ***  4. Peripheral artery disease (HCC) ***  5. Asymptomatic bilateral carotid artery stenosis ***  Assessment and Plan                {Are you ordering a CV Procedure (e.g. stress test, cath, DCCV, TEE, etc)?   Press F2        :[id number]}   Signed,  Yates Decamp, MD, [facility] 06/13/2023, 12:31 PM [facility] [address] [place] [place], [place] [address] Phone: [phone]. Fax:  [phone]

## 2023-06-16 ENCOUNTER — Encounter: Payer: Self-pay | Admitting: Cardiology

## 2023-06-19 ENCOUNTER — Encounter (HOSPITAL_COMMUNITY): Payer: Self-pay | Admitting: *Deleted

## 2023-06-27 ENCOUNTER — Other Ambulatory Visit: Payer: Self-pay | Admitting: Cardiology

## 2023-06-27 DIAGNOSIS — I10 Essential (primary) hypertension: Secondary | ICD-10-CM

## 2023-06-27 DIAGNOSIS — Z940 Kidney transplant status: Secondary | ICD-10-CM | POA: Diagnosis not present

## 2023-07-06 NOTE — Progress Notes (Deleted)
Cardiology Office Note:  .   Date:  07/06/2023  ID:  Colin Mulders, DOB 1971/01/25, MRN [id number] PCP: Jackie Plum, MD  [facility] Providers Cardiologist:  None { Click to update primary MD,subspecialty MD or APP then REFRESH:1}  History of Present Illness: .   [name] is a 53 y.o. AA female patient with renal transplantation due to diabetic and hypertensive renal disease on 01/23/2013 at [facility] and followed by Dr. Zetta Bills, HTN, orthostatic hypotension due to diabetic neuropathy, diabetic retinopathy and severe PAD with right common iliac artery stenting on 09/19/2015, again on 10/06/2015 had stenting of the right external iliac artery but eventually underwent RAKA in 2017 and walks with prosthesis. She has had left SFA angioplasty in Feb 2019 by Dr. Wyn Quaker in [place]   Discussed the use of AI scribe software for clinical note transcription with the patient, who gave verbal consent to proceed.  History of Present Illness            Labs   Lab Results  Component Value Date   CHOL 171 06/03/2019   HDL 88 06/03/2019   LDLCALC 70 06/03/2019   LDLDIRECT 220 (H) 03/31/2009   TRIG 71 06/03/2019   CHOLHDL 3.5 09/03/2018   Lab Results  Component Value Date   NA 139 07/28/2019   K 3.1 (L) 07/28/2019   CO2 26 07/28/2019   GLUCOSE 67 (L) 07/28/2019   BUN 18 07/28/2019   CREATININE 1.04 (H) 07/28/2019   CALCIUM 9.3 07/28/2019   EGFR 60 (L) 08/22/2014   GFRNONAA >60 07/28/2019      Latest Ref Rng & Units 07/28/2019    6:23 PM 06/28/2018   12:07 PM 08/12/2016   11:04 AM  BMP  Glucose 70 - 99 mg/dL 67  657    BUN 6 - 20 mg/dL 18  17  23    Creatinine 0.44 - 1.00 mg/dL 8.46  9.62  9.52   Sodium 135 - 145 mmol/L 139  139    Potassium 3.5 - 5.1 mmol/L 3.1  4.0    Chloride 98 - 111 mmol/L 101  108    CO2 22 - 32 mmol/L 26  23    Calcium 8.9 - 10.3 mg/dL 9.3  9.0        Latest Ref Rng & Units 07/28/2019    6:23 PM 06/28/2018   12:07 PM 04/26/2016    10:49 AM  CBC  WBC 4.0 - 10.5 K/uL 5.0  3.1    Hemoglobin 12.0 - 15.0 g/dL 84.1  32.4  40.1   Hematocrit 36.0 - 46.0 % 40.3  38.5    Platelets 150 - 400 K/uL 201  152     External Labs:  ***  ***ROS  Physical Exam:   VS:  There were no vitals taken for this visit.   Wt Readings from Last 3 Encounters:  02/26/23 176 lb (79.8 kg)  10/14/22 174 lb 9.6 oz (79.2 kg)  04/30/21 176 lb (79.8 kg)     ***Physical Exam  Studies Reviewed: .    *** EKG:         10/14/2022: Normal sinus rhythm at the rate of 95 bpm, left atrial enlargement, no evidence of ischemia   Medications and allergies    No Known Allergies   Current Outpatient Medications:    albuterol (ACCUNEB) 0.63 MG/3ML nebulizer solution, Take 1 ampule by nebulization every 6 (six) hours as needed for wheezing or shortness of breath. , Disp: ,  Rfl:    albuterol (PROVENTIL HFA;VENTOLIN HFA) 108 (90 BASE) MCG/ACT inhaler, Inhale 1 puff into the lungs every 6 (six) hours as needed for wheezing or shortness of breath., Disp: , Rfl:    amLODipine (NORVASC) 10 MG tablet, Take 0.5 tablets (5 mg total) by mouth 2 (two) times daily. If sitting BP >140/80 mm Hg (Patient taking differently: Take 10 mg by mouth every evening.), Disp: 90 tablet, Rfl: 3   cloNIDine (CATAPRES - DOSED IN MG/24 HR) 0.2 mg/24hr patch, APPLY 1 PATCH TOPICALLY ONCE A WEEK, Disp: 4 patch, Rfl: 3   Continuous Blood Gluc Receiver (DEXCOM G6 RECEIVER) DEVI, See admin instructions., Disp: , Rfl:    Continuous Blood Gluc Sensor (DEXCOM G6 SENSOR) MISC, See admin instructions., Disp: , Rfl:    Continuous Blood Gluc Transmit (DEXCOM G6 TRANSMITTER) MISC, See admin instructions., Disp: , Rfl:    diphenoxylate-atropine (LOMOTIL) 2.5-0.025 MG tablet, Take 1 tablet by mouth 3 (three) times daily as needed for diarrhea or loose stools. , Disp: , Rfl:    fluticasone (FLONASE) 50 MCG/ACT nasal spray, Place 2 sprays into both nostrils daily as needed for allergies., Disp:  11.1 mL, Rfl: 0   furosemide (LASIX) 40 MG tablet, Take 40 mg by mouth daily. , Disp: , Rfl:    Insulin Disposable Pump (OMNIPOD 5 G6 POD, GEN 5,) MISC, Use as directed., Disp: 1 each, Rfl: 1   Insulin Pen Needle (NOVOFINE) 30G X 8 MM MISC, Inject 10 each into the skin as needed., Disp: 5 each, Rfl: 0   methenamine (HIPREX) 1 g tablet, Take 1 g by mouth 2 (two) times daily with a meal., Disp: , Rfl:    metoprolol succinate (TOPROL-XL) 50 MG 24 hr tablet, Take 1 tablet (50 mg total) by mouth daily. Take with or immediately following a meal., Disp: 90 tablet, Rfl: 3   Multiple Vitamins-Minerals (WOMENS MULTIVITAMIN PO), Take by mouth., Disp: , Rfl:    mycophenolate (MYFORTIC) 180 MG EC tablet, Take 540 mg by mouth 2 (two) times daily. (540mg ) in the morning and bedtime-( 3 capsules ), Disp: , Rfl:    pantoprazole (PROTONIX) 40 MG tablet, Take 40 mg by mouth at bedtime. , Disp: , Rfl:    rosuvastatin (CRESTOR) 10 MG tablet, TAKE 1 TABLET BY MOUTH ONCE DAILY . APPOINTMENT REQUIRED FOR FUTURE REFILLS, Disp: 90 tablet, Rfl: 3   tacrolimus (PROGRAF) 1 MG capsule, Take 2 mg by mouth 2 (two) times daily. , Disp: , Rfl:    tirzepatide (MOUNJARO) 2.5 MG/0.5ML Pen, Inject 2.5 mg into the skin once a week., Disp: , Rfl:    ASSESSMENT AND PLAN: .      ICD-10-CM   1. Supine hypertension  I10     2. Orthostatic hypotension  I95.1     3. Kidney transplant recipient  Z94.0       1. Supine hypertension ***  2. Orthostatic hypotension ***  3.  Kidney transplant recipient ***  Assessment and Plan                 Signed,  Yates Decamp, MD, [facility] 07/06/2023, 7:40 PM [facility] [address] [place], [place] [address] Phone: [phone]. Fax:  [phone]

## 2023-07-07 ENCOUNTER — Ambulatory Visit: Payer: Managed Care, Other (non HMO) | Attending: Cardiology | Admitting: Cardiology

## 2023-07-07 DIAGNOSIS — I951 Orthostatic hypotension: Secondary | ICD-10-CM

## 2023-07-07 DIAGNOSIS — I10 Essential (primary) hypertension: Secondary | ICD-10-CM

## 2023-07-07 DIAGNOSIS — Z940 Kidney transplant status: Secondary | ICD-10-CM

## 2023-07-08 ENCOUNTER — Encounter: Payer: Self-pay | Admitting: Cardiology

## 2023-07-16 DIAGNOSIS — M2042 Other hammer toe(s) (acquired), left foot: Secondary | ICD-10-CM | POA: Diagnosis not present

## 2023-07-16 DIAGNOSIS — I739 Peripheral vascular disease, unspecified: Secondary | ICD-10-CM | POA: Diagnosis not present

## 2023-07-16 DIAGNOSIS — L84 Corns and callosities: Secondary | ICD-10-CM | POA: Diagnosis not present

## 2023-07-29 DIAGNOSIS — R809 Proteinuria, unspecified: Secondary | ICD-10-CM | POA: Diagnosis not present

## 2023-07-29 DIAGNOSIS — E0840 Diabetes mellitus due to underlying condition with diabetic neuropathy, unspecified: Secondary | ICD-10-CM | POA: Diagnosis not present

## 2023-07-29 DIAGNOSIS — I951 Orthostatic hypotension: Secondary | ICD-10-CM | POA: Diagnosis not present

## 2023-07-29 DIAGNOSIS — E049 Nontoxic goiter, unspecified: Secondary | ICD-10-CM | POA: Diagnosis not present

## 2023-07-29 DIAGNOSIS — E1165 Type 2 diabetes mellitus with hyperglycemia: Secondary | ICD-10-CM | POA: Diagnosis not present

## 2023-07-29 DIAGNOSIS — E7800 Pure hypercholesterolemia, unspecified: Secondary | ICD-10-CM | POA: Diagnosis not present

## 2023-07-29 DIAGNOSIS — I10 Essential (primary) hypertension: Secondary | ICD-10-CM | POA: Diagnosis not present

## 2023-07-29 DIAGNOSIS — Z940 Kidney transplant status: Secondary | ICD-10-CM | POA: Diagnosis not present

## 2023-08-04 DIAGNOSIS — E1165 Type 2 diabetes mellitus with hyperglycemia: Secondary | ICD-10-CM | POA: Diagnosis not present

## 2023-08-04 DIAGNOSIS — Z794 Long term (current) use of insulin: Secondary | ICD-10-CM | POA: Diagnosis not present

## 2023-08-14 ENCOUNTER — Ambulatory Visit: Payer: Managed Care, Other (non HMO) | Admitting: Orthopedic Surgery

## 2023-08-14 ENCOUNTER — Encounter: Payer: Self-pay | Admitting: Orthopedic Surgery

## 2023-08-14 DIAGNOSIS — Z89511 Acquired absence of right leg below knee: Secondary | ICD-10-CM

## 2023-08-14 NOTE — Progress Notes (Signed)
 Office Visit Note   Patient: [name]           Date of Birth: [birth year]           MRN: [id number] Visit Date: 08/14/2023              Requested by: Jackie Plum, MD [address] [place],  [place] [address] PCP: Jackie Plum, MD  Chief Complaint  Patient presents with   Right Leg - Follow-up    Needs new rx for prosthetic, liner and shrinker      HPI: Patient is a 53 year old woman who is seen in follow-up for right below-knee amputation.  Her socket is about 73 to 54 years old the liner is torn.  She is subsiding into the socket with pressure over the patella.  Assessment & Plan: Visit Diagnoses:  1. Right below-knee amputee [facility])     Plan: Patient is provided a prescription for a new socket, liner, materials and supplies  Follow-Up Instructions: Return if symptoms worsen or fail to improve.   Ortho Exam  Patient is alert, oriented, no adenopathy, well-dressed, normal affect, normal respiratory effort. Examination there is callus and preulcerative skin over the patella from subsiding into her socket.  She has no rotational control.  There are no and bearing ulcers.  Patient has lost significant volume of the residual limb with subsiding into the socket.  Patient is an existing right transtibial  amputee.  Patient's current comorbidities are not expected to impact the ability to function with the prescribed prosthesis. Patient verbally communicates a strong desire to use a prosthesis. Patient currently requires mobility aids to ambulate without a prosthesis.  Expects not to use mobility aids with a new prosthesis.  Patient is a K3 level ambulator that spends a lot of time walking around on uneven terrain over obstacles, up and down stairs, and ambulates with a variable cadence.     Imaging: No results found. No images are attached to the encounter.  Labs: Lab Results  Component Value Date   HGBA1C 8.3 (H) 10/13/2015   HGBA1C 6.3  (H) 12/20/2012   HGBA1C 6.1 (H) 09/27/2011   REPTSTATUS 12/09/2015 FINAL 12/06/2015   CULT 40,000 COLONIES/mL ESCHERICHIA COLI (A) 12/06/2015   LABORGA ESCHERICHIA COLI (A) 12/06/2015     Lab Results  Component Value Date   ALBUMIN 3.5 06/28/2018   ALBUMIN 1.8 (L) 12/10/2015   ALBUMIN 1.8 (L) 12/09/2015    Lab Results  Component Value Date   MG 1.9 12/11/2015   MG 2.2 12/09/2015   MG 2.5 (H) 12/08/2015   Lab Results  Component Value Date   VD25OH 24 (L) 09/28/2011    No results found for: "PREALBUMIN"    Latest Ref Rng & Units 07/28/2019    6:23 PM 06/28/2018   12:07 PM 04/26/2016   10:49 AM  CBC EXTENDED  WBC 4.0 - 10.5 K/uL 5.0  3.1    RBC 3.87 - 5.11 MIL/uL 4.98  4.78    Hemoglobin 12.0 - 15.0 g/dL 47.8  29.5  62.1   HCT 36.0 - 46.0 % 40.3  38.5    Platelets 150 - 400 K/uL 201  152    NEUT# 1.7 - 7.7 K/uL 3.5  1.9    Lymph# 0.7 - 4.0 K/uL 0.9  0.7       There is no height or weight on file to calculate BMI.  Orders:  No orders of the defined types were placed in this  encounter.  No orders of the defined types were placed in this encounter.    Procedures: No procedures performed  Clinical Data: No additional findings.  ROS:  All other systems negative, except as noted in the HPI. Review of Systems  Objective: Vital Signs: There were no vitals taken for this visit.  Specialty Comments:  No specialty comments available.  PMFS History: Patient Active Problem List   Diagnosis Date Noted   Abrasion of toe of left foot 10/05/2020   Recurrent UTI 08/21/2020   Corn of toe 07/04/2020   Cough 06/22/2020   Neurogenic orthostatic hypotension (HCC) 08/04/2018   Bilateral carotid bruits 08/04/2018   Immunosuppression (HCC) 08/04/2018   CMV (cytomegalovirus infection) (HCC) 08/04/2018   Other chronic sinusitis 06/26/2017   Steal syndrome as complication of dialysis access (HCC) 03/28/2017   History of renal transplant 12/06/2015   Below knee  amputation status 11/29/2015   S/P unilateral BKA (below knee amputation) (HCC)    Type 2 diabetes mellitus with peripheral neuropathy (HCC)    Chronic kidney disease, stage IV (severe) (HCC)    Essential hypertension    Anemia in chronic kidney disease    PAD (peripheral artery disease) (HCC) 09/18/2015   Leukopenia 08/22/2014   Physical deconditioning 10/09/2011   GOUT 12/21/2008   Uncontrolled type 2 diabetes mellitus with peripheral artery disease 08/14/2006   Hypercholesteremia 08/14/2006   FIBROADENOSIS, BREAST 08/14/2006   Irregular menstrual cycle 08/14/2006   Past Medical History:  Diagnosis Date   Anemia    Below knee amputation (HCC)    RIGHT   CHF (congestive heart failure) (HCC)    PT UNAWARE OF THIS DX   Chronic kidney disease    PAST HX OF ESRD- HAD KIDNEY TRANSPLANT 01-24-2013   Critical lower limb ischemia (HCC) 10/05/2015   Enlarged heart    WITH IRREGULAR HEART RATE   GERD (gastroesophageal reflux disease)    High cholesterol    History of blood transfusion    related to "kidney transplant"   Hypercholesteremia 08/14/2006   Qualifier: Diagnosis of  By: Abundio Miu     Hypertension    Kidney transplant recipient    01/24/2013   Mechanical complication of other vascular device, implant, and graft 09/22/2012   Metabolic bone disease    Orthostatic hypotension    PAD (peripheral artery disease) (HCC)    PT UNAWARE OF THIS DIAGNOSIS   Pericardial effusion    Retinopathy    Type II diabetes mellitus (HCC)    "controlled with diet"    Family History  Problem Relation Age of Onset   Malignant hyperthermia Mother    Thyroid disease Mother    Hypertension Mother    Malignant hyperthermia Father    Hypertension Father    Diabetes Brother    Anesthesia problems Neg Hx    Colon cancer Neg Hx    Esophageal cancer Neg Hx    Colon polyps Neg Hx    Rectal cancer Neg Hx    Stomach cancer Neg Hx     Past Surgical History:  Procedure Laterality Date    AMPUTATION Right 10/13/2015   Procedure: Right Transmetatarsal Amputation;  Surgeon: Nadara Mustard, MD;  Location: [facility] OR;  Service: Orthopedics;  Laterality: Right;   AMPUTATION Right 11/29/2015   Procedure: RIGHT BELOW KNEE AMPUTATION;  Surgeon: Nadara Mustard, MD;  Location: [facility] OR;  Service: Orthopedics;  Laterality: Right;   AMPUTATION FINGER     Rt hand middle finger   AV FISTULA  PLACEMENT  10/04/2011   Procedure: INSERTION OF ARTERIOVENOUS (AV) GORE-TEX GRAFT ARM;  Surgeon: Chuck Hint, MD;  Location: [facility] OR;  Service: Vascular;  Laterality: Left;  Insertion left upper arm Arteriovenous goretex graft   AV FISTULA PLACEMENT  10/29/2011   Procedure: ARTERIOVENOUS (AV) FISTULA CREATION;  Surgeon: Chuck Hint, MD;  Location[facility] OR;  Service: Vascular;  Laterality: Right;  Creation Right Arteriovenous Fistula    AVGG REMOVAL  10/04/2011   Procedure: REMOVAL OF ARTERIOVENOUS GORETEX GRAFT (AVGG);  Surgeon: Sherren Kerns, MD;  Location: [facility] OR;  Service: Vascular;  Laterality: Left;   BELOW THE KNEE AMPUTATION Right    BLADDER SUSPENSION     03/08/2022   CHOLECYSTECTOMY N/A 12/08/2015   Procedure: LAPAROSCOPIC CHOLECYSTECTOMY WITH INTRAOPERATIVE CHOLANGIOGRAM;  Surgeon: Almond Lint, MD;  Location: [facility] OR;  Service: General;  Laterality: N/A;   ESOPHAGOGASTRODUODENOSCOPY N/A 12/24/2012   Procedure: ESOPHAGOGASTRODUODENOSCOPY (EGD);  Surgeon: Rachael Fee, MD;  Location: [facility] ENDOSCOPY;  Service: Endoscopy;  Laterality: N/A;   FINGER AMPUTATION Right 02/26/2013   "3rd finger"   INSERTION OF DIALYSIS CATHETER  10/04/2011   Procedure: INSERTION OF DIALYSIS CATHETER;  Surgeon: Chuck Hint, MD;  Location: [facility] OR;  Service: Vascular;  Laterality: Right;  insertion of dialysis catheter right internal jugular   INSERTION OF DIALYSIS CATHETER  06/23/2012   Procedure: INSERTION OF DIALYSIS CATHETER;  Surgeon: Chuck Hint, MD;  Location: [facility] OR;  Service: Vascular;   Laterality: N/A;  Ultrasound guided   KIDNEY TRANSPLANT  01/24/2013   LOWER EXTREMITY ANGIOGRAPHY Left 08/12/2016   Procedure: Lower Extremity Angiography;  Surgeon: Annice Needy, MD;  Location: [facility] INVASIVE CV LAB;  Service: Cardiovascular;  Laterality: Left;   LOWER EXTREMITY INTERVENTION  08/12/2016   Procedure: Lower Extremity Intervention;  Surgeon: Annice Needy, MD;  Location: [facility] INVASIVE CV LAB;  Service: Cardiovascular;;   NEPHRECTOMY TRANSPLANTED ORGAN     PATCH ANGIOPLASTY  06/23/2012   Procedure: PATCH ANGIOPLASTY;  Surgeon: Chuck Hint, MD;  Location: [facility] OR;  Service: Vascular;  Laterality: Right;   PERIPHERAL VASCULAR CATHETERIZATION N/A 09/19/2015   Procedure: Lower Extremity Angiography;  Surgeon: Yates Decamp, MD;  Location: [facility] INVASIVE CV LAB;  Service: Cardiovascular;  Laterality: N/A;   PERIPHERAL VASCULAR CATHETERIZATION N/A 09/19/2015   Procedure: Abdominal Aortogram;  Surgeon: Yates Decamp, MD;  Location: [facility] INVASIVE CV LAB;  Service: Cardiovascular;  Laterality: N/A;   PERIPHERAL VASCULAR CATHETERIZATION Right 09/19/2015   Procedure: Peripheral Vascular Intervention;  Surgeon: Yates Decamp, MD;  Location: [facility] INVASIVE CV LAB;  Service: Cardiovascular;  Laterality: Right;  Right Common  Iliac   PERIPHERAL VASCULAR CATHETERIZATION N/A 10/06/2015   Procedure: Lower Extremity Angiography;  Surgeon: Yates Decamp, MD;  Location: [facility] INVASIVE CV LAB;  Service: Cardiovascular;  Laterality: N/A;   PERIPHERAL VASCULAR CATHETERIZATION  10/06/2015   Procedure: Peripheral Vascular Intervention;  Surgeon: Yates Decamp, MD;  Location: [facility] INVASIVE CV LAB;  Service: Cardiovascular;;  REIA  Omnilink 6.0x29, innova 7x80  RSFA innova 5x80   REVISON OF ARTERIOVENOUS FISTULA  06/23/2012   Procedure: REVISON OF ARTERIOVENOUS FISTULA;  Surgeon: Chuck Hint, MD;  Location: [facility] OR;  Service: Vascular;  Laterality: Right;  Ultrasound guided   SHUNTOGRAM N/A 04/06/2012   Procedure: Betsey Amen;   Surgeon: Chuck Hint, MD;  Location: [facility] CATH LAB;  Service: Cardiovascular;  Laterality: N/A;   Social History   Occupational History   Not on file  Tobacco Use   Smoking  status: Never   Smokeless tobacco: Never  Vaping Use   Vaping status: Never Used  Substance and Sexual Activity   Alcohol use: No   Drug use: No   Sexual activity: Not on file

## 2023-08-18 DIAGNOSIS — J4540 Moderate persistent asthma, uncomplicated: Secondary | ICD-10-CM | POA: Diagnosis not present

## 2023-08-18 DIAGNOSIS — E1165 Type 2 diabetes mellitus with hyperglycemia: Secondary | ICD-10-CM | POA: Diagnosis not present

## 2023-08-18 DIAGNOSIS — E782 Mixed hyperlipidemia: Secondary | ICD-10-CM | POA: Diagnosis not present

## 2023-08-18 DIAGNOSIS — J302 Other seasonal allergic rhinitis: Secondary | ICD-10-CM | POA: Diagnosis not present

## 2023-08-18 DIAGNOSIS — K5909 Other constipation: Secondary | ICD-10-CM | POA: Diagnosis not present

## 2023-08-18 DIAGNOSIS — K219 Gastro-esophageal reflux disease without esophagitis: Secondary | ICD-10-CM | POA: Diagnosis not present

## 2023-08-18 DIAGNOSIS — Z794 Long term (current) use of insulin: Secondary | ICD-10-CM | POA: Diagnosis not present

## 2023-08-18 DIAGNOSIS — E559 Vitamin D deficiency, unspecified: Secondary | ICD-10-CM | POA: Diagnosis not present

## 2023-08-18 DIAGNOSIS — I10 Essential (primary) hypertension: Secondary | ICD-10-CM | POA: Diagnosis not present

## 2023-08-21 DIAGNOSIS — Z794 Long term (current) use of insulin: Secondary | ICD-10-CM | POA: Diagnosis not present

## 2023-08-21 DIAGNOSIS — E1165 Type 2 diabetes mellitus with hyperglycemia: Secondary | ICD-10-CM | POA: Diagnosis not present

## 2023-09-12 DIAGNOSIS — M2042 Other hammer toe(s) (acquired), left foot: Secondary | ICD-10-CM | POA: Diagnosis not present

## 2023-09-12 DIAGNOSIS — I739 Peripheral vascular disease, unspecified: Secondary | ICD-10-CM | POA: Diagnosis not present

## 2023-09-12 DIAGNOSIS — L84 Corns and callosities: Secondary | ICD-10-CM | POA: Diagnosis not present

## 2023-09-22 DIAGNOSIS — L0232 Furuncle of buttock: Secondary | ICD-10-CM | POA: Diagnosis not present

## 2023-09-22 DIAGNOSIS — E1165 Type 2 diabetes mellitus with hyperglycemia: Secondary | ICD-10-CM | POA: Diagnosis not present

## 2023-09-22 DIAGNOSIS — Z794 Long term (current) use of insulin: Secondary | ICD-10-CM | POA: Diagnosis not present

## 2023-10-01 DIAGNOSIS — D631 Anemia in chronic kidney disease: Secondary | ICD-10-CM | POA: Diagnosis not present

## 2023-10-01 DIAGNOSIS — I129 Hypertensive chronic kidney disease with stage 1 through stage 4 chronic kidney disease, or unspecified chronic kidney disease: Secondary | ICD-10-CM | POA: Diagnosis not present

## 2023-10-01 DIAGNOSIS — N2581 Secondary hyperparathyroidism of renal origin: Secondary | ICD-10-CM | POA: Diagnosis not present

## 2023-10-01 DIAGNOSIS — N189 Chronic kidney disease, unspecified: Secondary | ICD-10-CM | POA: Diagnosis not present

## 2023-10-01 DIAGNOSIS — N182 Chronic kidney disease, stage 2 (mild): Secondary | ICD-10-CM | POA: Diagnosis not present

## 2023-10-01 DIAGNOSIS — Z940 Kidney transplant status: Secondary | ICD-10-CM | POA: Diagnosis not present

## 2023-10-03 ENCOUNTER — Encounter (HOSPITAL_COMMUNITY): Payer: Self-pay | Admitting: *Deleted

## 2023-10-03 DIAGNOSIS — Z89511 Acquired absence of right leg below knee: Secondary | ICD-10-CM | POA: Diagnosis not present

## 2023-10-06 DIAGNOSIS — J302 Other seasonal allergic rhinitis: Secondary | ICD-10-CM | POA: Diagnosis not present

## 2023-10-06 DIAGNOSIS — K5909 Other constipation: Secondary | ICD-10-CM | POA: Diagnosis not present

## 2023-10-06 DIAGNOSIS — E559 Vitamin D deficiency, unspecified: Secondary | ICD-10-CM | POA: Diagnosis not present

## 2023-10-06 DIAGNOSIS — K219 Gastro-esophageal reflux disease without esophagitis: Secondary | ICD-10-CM | POA: Diagnosis not present

## 2023-10-06 DIAGNOSIS — E782 Mixed hyperlipidemia: Secondary | ICD-10-CM | POA: Diagnosis not present

## 2023-10-06 DIAGNOSIS — Z794 Long term (current) use of insulin: Secondary | ICD-10-CM | POA: Diagnosis not present

## 2023-10-06 DIAGNOSIS — J4540 Moderate persistent asthma, uncomplicated: Secondary | ICD-10-CM | POA: Diagnosis not present

## 2023-10-06 DIAGNOSIS — R519 Headache, unspecified: Secondary | ICD-10-CM | POA: Diagnosis not present

## 2023-10-06 DIAGNOSIS — I10 Essential (primary) hypertension: Secondary | ICD-10-CM | POA: Diagnosis not present

## 2023-10-06 DIAGNOSIS — E1165 Type 2 diabetes mellitus with hyperglycemia: Secondary | ICD-10-CM | POA: Diagnosis not present

## 2023-10-13 ENCOUNTER — Ambulatory Visit: Payer: Managed Care, Other (non HMO) | Attending: Cardiology | Admitting: Cardiology

## 2023-10-15 ENCOUNTER — Encounter: Payer: Self-pay | Admitting: Cardiology

## 2023-10-15 DIAGNOSIS — L84 Corns and callosities: Secondary | ICD-10-CM | POA: Diagnosis not present

## 2023-10-15 DIAGNOSIS — M2042 Other hammer toe(s) (acquired), left foot: Secondary | ICD-10-CM | POA: Diagnosis not present

## 2023-10-15 DIAGNOSIS — I739 Peripheral vascular disease, unspecified: Secondary | ICD-10-CM | POA: Diagnosis not present

## 2023-10-22 ENCOUNTER — Other Ambulatory Visit: Payer: Self-pay | Admitting: Cardiology

## 2023-10-22 DIAGNOSIS — I10 Essential (primary) hypertension: Secondary | ICD-10-CM

## 2023-10-22 DIAGNOSIS — E1165 Type 2 diabetes mellitus with hyperglycemia: Secondary | ICD-10-CM | POA: Diagnosis not present

## 2023-10-22 DIAGNOSIS — Z794 Long term (current) use of insulin: Secondary | ICD-10-CM | POA: Diagnosis not present

## 2023-10-31 DIAGNOSIS — K5909 Other constipation: Secondary | ICD-10-CM | POA: Diagnosis not present

## 2023-10-31 DIAGNOSIS — E782 Mixed hyperlipidemia: Secondary | ICD-10-CM | POA: Diagnosis not present

## 2023-10-31 DIAGNOSIS — Z0001 Encounter for general adult medical examination with abnormal findings: Secondary | ICD-10-CM | POA: Diagnosis not present

## 2023-10-31 DIAGNOSIS — I10 Essential (primary) hypertension: Secondary | ICD-10-CM | POA: Diagnosis not present

## 2023-10-31 DIAGNOSIS — Z794 Long term (current) use of insulin: Secondary | ICD-10-CM | POA: Diagnosis not present

## 2023-10-31 DIAGNOSIS — J4540 Moderate persistent asthma, uncomplicated: Secondary | ICD-10-CM | POA: Diagnosis not present

## 2023-10-31 DIAGNOSIS — K219 Gastro-esophageal reflux disease without esophagitis: Secondary | ICD-10-CM | POA: Diagnosis not present

## 2023-10-31 DIAGNOSIS — B372 Candidiasis of skin and nail: Secondary | ICD-10-CM | POA: Diagnosis not present

## 2023-10-31 DIAGNOSIS — R519 Headache, unspecified: Secondary | ICD-10-CM | POA: Diagnosis not present

## 2023-10-31 DIAGNOSIS — E559 Vitamin D deficiency, unspecified: Secondary | ICD-10-CM | POA: Diagnosis not present

## 2023-10-31 DIAGNOSIS — E1165 Type 2 diabetes mellitus with hyperglycemia: Secondary | ICD-10-CM | POA: Diagnosis not present

## 2023-10-31 DIAGNOSIS — J302 Other seasonal allergic rhinitis: Secondary | ICD-10-CM | POA: Diagnosis not present

## 2023-11-04 ENCOUNTER — Encounter (INDEPENDENT_AMBULATORY_CARE_PROVIDER_SITE_OTHER): Payer: Self-pay

## 2023-11-12 DIAGNOSIS — L304 Erythema intertrigo: Secondary | ICD-10-CM | POA: Diagnosis not present

## 2023-11-12 DIAGNOSIS — L83 Acanthosis nigricans: Secondary | ICD-10-CM | POA: Diagnosis not present

## 2023-11-19 DIAGNOSIS — I10 Essential (primary) hypertension: Secondary | ICD-10-CM | POA: Diagnosis not present

## 2023-11-19 DIAGNOSIS — E7800 Pure hypercholesterolemia, unspecified: Secondary | ICD-10-CM | POA: Diagnosis not present

## 2023-11-19 DIAGNOSIS — R809 Proteinuria, unspecified: Secondary | ICD-10-CM | POA: Diagnosis not present

## 2023-11-19 DIAGNOSIS — E1165 Type 2 diabetes mellitus with hyperglycemia: Secondary | ICD-10-CM | POA: Diagnosis not present

## 2023-11-24 DIAGNOSIS — E1165 Type 2 diabetes mellitus with hyperglycemia: Secondary | ICD-10-CM | POA: Diagnosis not present

## 2023-11-24 DIAGNOSIS — Z794 Long term (current) use of insulin: Secondary | ICD-10-CM | POA: Diagnosis not present

## 2023-11-26 DIAGNOSIS — Z940 Kidney transplant status: Secondary | ICD-10-CM | POA: Diagnosis not present

## 2023-11-26 DIAGNOSIS — E049 Nontoxic goiter, unspecified: Secondary | ICD-10-CM | POA: Diagnosis not present

## 2023-11-26 DIAGNOSIS — E1165 Type 2 diabetes mellitus with hyperglycemia: Secondary | ICD-10-CM | POA: Diagnosis not present

## 2023-11-26 DIAGNOSIS — E7800 Pure hypercholesterolemia, unspecified: Secondary | ICD-10-CM | POA: Diagnosis not present

## 2023-11-26 DIAGNOSIS — I10 Essential (primary) hypertension: Secondary | ICD-10-CM | POA: Diagnosis not present

## 2023-11-27 ENCOUNTER — Other Ambulatory Visit: Payer: Self-pay | Admitting: Internal Medicine

## 2023-11-27 DIAGNOSIS — Z1231 Encounter for screening mammogram for malignant neoplasm of breast: Secondary | ICD-10-CM

## 2023-11-28 DIAGNOSIS — Z538 Procedure and treatment not carried out for other reasons: Secondary | ICD-10-CM | POA: Diagnosis not present

## 2023-11-28 DIAGNOSIS — D219 Benign neoplasm of connective and other soft tissue, unspecified: Secondary | ICD-10-CM | POA: Diagnosis not present

## 2023-12-02 ENCOUNTER — Encounter

## 2023-12-02 DIAGNOSIS — Z780 Asymptomatic menopausal state: Secondary | ICD-10-CM | POA: Diagnosis not present

## 2023-12-11 ENCOUNTER — Encounter: Payer: Self-pay | Admitting: Cardiology

## 2023-12-11 ENCOUNTER — Other Ambulatory Visit: Payer: Self-pay | Admitting: *Deleted

## 2023-12-11 ENCOUNTER — Ambulatory Visit: Attending: Cardiology | Admitting: Cardiology

## 2023-12-11 VITALS — BP 138/85 | HR 101 | Resp 16 | Ht 60.0 in | Wt 168.2 lb

## 2023-12-11 DIAGNOSIS — I10 Essential (primary) hypertension: Secondary | ICD-10-CM

## 2023-12-11 DIAGNOSIS — I739 Peripheral vascular disease, unspecified: Secondary | ICD-10-CM

## 2023-12-11 DIAGNOSIS — E7800 Pure hypercholesterolemia, unspecified: Secondary | ICD-10-CM | POA: Diagnosis not present

## 2023-12-11 DIAGNOSIS — I951 Orthostatic hypotension: Secondary | ICD-10-CM

## 2023-12-11 DIAGNOSIS — Z940 Kidney transplant status: Secondary | ICD-10-CM | POA: Diagnosis not present

## 2023-12-11 MED ORDER — AMLODIPINE BESYLATE 10 MG PO TABS: 10.0000 mg | ORAL_TABLET | Freq: Two times a day (BID) | ORAL | Status: DC

## 2023-12-11 MED ORDER — ROSUVASTATIN CALCIUM 20 MG PO TABS: 20.0000 mg | ORAL_TABLET | Freq: Every day | ORAL | 3 refills | Status: DC

## 2023-12-11 MED ORDER — CLOPIDOGREL BISULFATE 75 MG PO TABS: 75.0000 mg | ORAL_TABLET | Freq: Every day | ORAL | 3 refills | Status: AC

## 2023-12-11 MED ORDER — AMLODIPINE BESYLATE 10 MG PO TABS: 10.0000 mg | ORAL_TABLET | Freq: Every day | ORAL | Status: DC

## 2023-12-11 NOTE — Progress Notes (Signed)
 Cardiology Office Note:  .   Date:  12/11/2023  ID:  [name], DOB 09/04/1970, MRN [id number] PCP: Catalina Bare, MD  [facility] Providers Cardiologist:  Gordy Bergamo, MD   History of Present Illness: .   [name] is a 53 y.o. with renal transplantation due to diabetic and hypertensive renal disease on 01/23/2013 at [facility] and followed by Dr. Gordy Blanch. She has Essential HTN, orthostatic hypotension due to diabetic neuropathy, diabetic retinopathy and severe PAD with right common iliac artery stenting on 09/19/2015, again on 10/06/2015 had stenting of the right external iliac artery but eventually underwent RBKA in 2017 and walks with prosthesis. She has had left SFA angioplasty in Feb 2019 by Dr. Marea in [place].  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  Continues to walk with the college grounds at [employer] without any symptoms of claudication.  She has not noticed any bluish discoloration.  Denies chest pain or shortness of breath or dizziness.  She has been compliant with all her medications.  Discussed the use of AI scribe software for clinical note transcription with the patient, who gave verbal consent to proceed.  History of Present Illness  [name] is a 53 year old female with diabetes and peripheral arterial disease who presents for cardiovascular follow-up.  She manages her diabetes with Mounjaro, maintaining her weight at 168 pounds. Her diet includes snacks like peanut butter, butter crackers, apple slices, and applesauce, and she consumes a protein shake with Carnation instant breakfast. She attends the gym twice a week and walks regularly on campus.  She has peripheral arterial disease and has undergone angioplasty to left SFA. She experiences no leg pain and is not on Plavix  or aspirin . She sees a podiatrist every three to four months for  monitoring.  Her hypercholesterolemia is managed with rosuvastatin . Her LDL has increased from 67 to 86, with a target of 55.  Her hypertension is managed with amlodipine  10 mg twice daily (takes it only SBP sitting close >140 mmHg) otherwise once daily, a clonidine  patch, and metoprolol  succinate 50 mg at night. She experiences minimal dizziness upon standing.  Labs   External Labs:  Care Everywhere labs 11/19/2023:  Serum glucose 226, BUN 11, creatinine 0.84, EGFR 83 mL, potassium 3.9, LFTs normal.  Total cholesterol 173, triglycerides 06/17/2021, HDL 65, LDL 86.  A1c 7.6%.  TSH normal at 4.07.  Labs 10/01/2023:  Total cholesterol 147, triglycerides 128, HDL 58, LDL 67.  ROS  Review of Systems  Cardiovascular:  Negative for chest pain, dyspnea on exertion and leg swelling.    Physical Exam:   VS:  BP 138/85 (BP Location: Left Arm, Patient Position: Sitting, Cuff Size: Normal)   Pulse (!) 101   Resp 16   Ht 5' (1.524 m)   Wt 168 lb 3.2 oz (76.3 kg)   SpO2 96%   BMI 32.85 kg/m    Wt Readings from Last 3 Encounters:  12/11/23 168 lb 3.2 oz (76.3 kg)  02/26/23 176 lb (79.8 kg)  10/14/22 174 lb 9.6 oz (79.2 kg)    Physical Exam Neck:     Vascular: Carotid bruit (bilateral) present. No JVD.   Cardiovascular:     Rate and Rhythm: Normal rate and regular rhythm.     Pulses:          Popliteal pulses are 0 on the left side. Right popliteal  pulse not accessible.       Dorsalis pedis pulses are 0 on the left side. Right dorsalis pedis pulse not accessible.     Heart sounds: S1 normal and S2 normal. Murmur heard.     Early systolic murmur is present with a grade of 2/6 at the upper right sternal border. Right arm AV fistula present and hum heard over upper precordium     No gallop.  Pulmonary:     Effort: Pulmonary effort is normal.     Breath sounds: Normal breath sounds.  Abdominal:     General: Bowel sounds are normal.     Palpations: Abdomen is soft.    Musculoskeletal:        General: Deformity (Right BKA, mechanical prosthesis present) present.     Left lower leg: No edema.   Skin:    General: Skin is warm.     Capillary Refill: Capillary refill takes less than 2 seconds. Left leg   Studies Reviewed: SABRA    ABI 12/17/2022: Left: Resting left ankle-brachial index is within normal range. The left toe-brachial index is abnormal.  Monophasic waveform in PT and biphasic waveform in DP.   No significant change from  study serial studies dating from 77 21 through 33 24. Right: NA (BKA) EKG:    EKG Interpretation Date/Time:  Thursday December 11 2023 16:34:44 EDT Ventricular Rate:  96 PR Interval:  176 QRS Duration:  82 QT Interval:  346 QTC Calculation: 437 R Axis:   0  Text Interpretation: EKG 12/11/2023: Normal sinus rhythm at the rate of 96 bpm, poor R wave progression, probably normal variant.  Compared to 06/28/2018, no change. Confirmed by Adison Jerger, Jagadeesh [phone]) on 12/11/2023 4:54:33 PM  10/14/2022: Normal sinus rhythm at the rate of 95 bpm, left atrial enlargement, no evidence of ischemia.   Medications ordered    Meds ordered this encounter  Medications   DISCONTD: amLODipine  (NORVASC ) 10 MG tablet    Sig: Take 1 tablet (10 mg total) by mouth 2 (two) times daily. If sitting BP >140/80 mm Hg   rosuvastatin  (CRESTOR ) 20 MG tablet    Sig: Take 1 tablet (20 mg total) by mouth daily.    Dispense:  90 tablet    Refill:  3   clopidogrel  (PLAVIX ) 75 MG tablet    Sig: Take 1 tablet (75 mg total) by mouth daily.    Dispense:  90 tablet    Refill:  3   amLODipine  (NORVASC ) 10 MG tablet    Sig: Take 1 tablet (10 mg total) by mouth daily. Take one extra tablet by mouth daily as needed for blood pressure >140/80     ASSESSMENT AND PLAN: .      ICD-10-CM   1. Essential hypertension  I10 EKG 12-Lead    CBC    DISCONTINUED: amLODipine  (NORVASC ) 10 MG tablet    2. Orthostatic hypotension  I95.1 CBC    3. Pure hypercholesterolemia   E78.00 rosuvastatin  (CRESTOR ) 20 MG tablet    Lipid Profile    4. Kidney transplant recipient  Z94.0     5. Peripheral artery disease (HCC)  I73.9 clopidogrel  (PLAVIX ) 75 MG tablet    CBC     Assessment & Plan Peripheral arterial disease Peripheral arterial disease is well-managed with no symptoms of claudication or leg pain. The left ABI is normal at 1.05. No further vascular intervention is needed at this time as ABI has remained stable since 2021. - Start Plavix  for peripheral arterial  disease - Discontinue unnecessary ABI testing and follow-ups with vascular specialists unless symptoms develop.  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  She needs aggressive medical therapy.  She has discontinued antiplatelet therapy. - Monitor for symptoms such as leg pain, discoloration, or claudication and if symptoms were to arise to contact us  immediately. - She has bilateral carotid bruit but probably conducted sound from aortic sclerosis.  She has had normal carotid duplex in the past.  She also has right arm AV shunt for dialysis and has conducted sounds as well.  Hyperlipidemia Hyperlipidemia management requires adjustment as recent labs show an increase in LDL from 67 to 86 mg/dL. The goal is to reduce LDL to 55 mg/dL, especially given her peripheral arterial disease. - Increase rosuvastatin  (Crestor ) to 20 mg once daily - Order lipid panel in 6-8 weeks to reassess LDL levels.  Goal LDL <55.  Hypertension Hypertension is well-controlled with current medication regimen. She is on amlodipine  10 mg daily, clonidine  patch 0.2 mg weekly, and metoprolol  succinate 50 mg daily in the evening. No significant symptoms of orthostatic hypotension are present. - Continue current antihypertensive regimen: amlodipine  10 mg daily, clonidine  patch, and metoprolol  succinate 50  mg at night  Type 2 diabetes mellitus Type 2 diabetes mellitus is being managed with Mounjaro, which has been effective in weight management and appetite control. She reports satisfaction with the medication and has not experienced weight regain. - Continue Mounjaro as prescribed, discussed diet changes.  I will check to see her back in 6 months for follow-up of tolerance to antiplatelet regimen with Plavix  and also to make sure her LDL is at goal and if she remains stable I will see her back on a as needed basis.   Signed,  Gordy Bergamo, MD, [facility] 12/11/2023, 5:36 PM [facility] [address] [place], [place] [address] Phone: [phone]. Fax:  [phone]

## 2023-12-11 NOTE — Patient Instructions (Signed)
 Medication Instructions:  Your physician has recommended you make the following change in your medication:  Increase Rosuvastatin  to 20 mg by mouth daily  Start Clopidogrel  75 mg by mouth daily   *If you need a refill on your cardiac medications before your next appointment, please call your pharmacy*  Lab Work: Have fasting lab work drawn in 6-8 weeks.  Lipids and CBC.  Can be done at any [facility].  There is a [facility] on the first floor of our building If you have labs (blood work) drawn today and your tests are completely normal, you will receive your results only by: MyChart Message (if you have MyChart) OR A paper copy in the mail If you have any lab test that is abnormal or we need to change your treatment, we will call you to review the results.  Testing/Procedures: none  Follow-Up: At [facility], you and your health needs are our priority.  As part of our continuing mission to provide you with exceptional heart care, our providers are all part of one team.  This team includes your primary Cardiologist (physician) and Advanced Practice Providers or APPs (Physician Assistants and Nurse Practitioners) who all work together to provide you with the care you need, when you need it.  Your next appointment:   6 month(s)  Provider:   Gordy Bergamo, MD    We recommend signing up for the patient portal called MyChart.  Sign up information is provided on this After Visit Summary.  MyChart is used to connect with patients for Virtual Visits (Telemedicine).  Patients are able to view lab/test results, encounter notes, upcoming appointments, etc.  Non-urgent messages can be sent to your provider as well.   To learn more about what you can do with MyChart, go to [email or web address].   Other Instructions

## 2023-12-22 ENCOUNTER — Ambulatory Visit
Admission: RE | Admit: 2023-12-22 | Discharge: 2023-12-22 | Disposition: A | Discharge status: Home / Self Care | Source: Ambulatory Visit | Attending: Internal Medicine | Admitting: Internal Medicine

## 2023-12-22 DIAGNOSIS — Z1231 Encounter for screening mammogram for malignant neoplasm of breast: Secondary | ICD-10-CM | POA: Diagnosis not present

## 2023-12-24 DIAGNOSIS — Z794 Long term (current) use of insulin: Secondary | ICD-10-CM | POA: Diagnosis not present

## 2023-12-24 DIAGNOSIS — E1165 Type 2 diabetes mellitus with hyperglycemia: Secondary | ICD-10-CM | POA: Diagnosis not present

## 2024-01-12 DIAGNOSIS — L84 Corns and callosities: Secondary | ICD-10-CM | POA: Diagnosis not present

## 2024-01-12 DIAGNOSIS — M2042 Other hammer toe(s) (acquired), left foot: Secondary | ICD-10-CM | POA: Diagnosis not present

## 2024-01-12 DIAGNOSIS — I739 Peripheral vascular disease, unspecified: Secondary | ICD-10-CM | POA: Diagnosis not present

## 2024-01-26 DIAGNOSIS — E1165 Type 2 diabetes mellitus with hyperglycemia: Secondary | ICD-10-CM | POA: Diagnosis not present

## 2024-01-26 DIAGNOSIS — Z940 Kidney transplant status: Secondary | ICD-10-CM | POA: Diagnosis not present

## 2024-01-26 DIAGNOSIS — N189 Chronic kidney disease, unspecified: Secondary | ICD-10-CM | POA: Diagnosis not present

## 2024-01-26 DIAGNOSIS — N2581 Secondary hyperparathyroidism of renal origin: Secondary | ICD-10-CM | POA: Diagnosis not present

## 2024-01-26 DIAGNOSIS — Z794 Long term (current) use of insulin: Secondary | ICD-10-CM | POA: Diagnosis not present

## 2024-01-26 DIAGNOSIS — N182 Chronic kidney disease, stage 2 (mild): Secondary | ICD-10-CM | POA: Diagnosis not present

## 2024-01-26 DIAGNOSIS — I129 Hypertensive chronic kidney disease with stage 1 through stage 4 chronic kidney disease, or unspecified chronic kidney disease: Secondary | ICD-10-CM | POA: Diagnosis not present

## 2024-02-02 DIAGNOSIS — N182 Chronic kidney disease, stage 2 (mild): Secondary | ICD-10-CM | POA: Diagnosis not present

## 2024-02-02 DIAGNOSIS — I129 Hypertensive chronic kidney disease with stage 1 through stage 4 chronic kidney disease, or unspecified chronic kidney disease: Secondary | ICD-10-CM | POA: Diagnosis not present

## 2024-02-02 DIAGNOSIS — N2581 Secondary hyperparathyroidism of renal origin: Secondary | ICD-10-CM | POA: Diagnosis not present

## 2024-02-02 DIAGNOSIS — Z940 Kidney transplant status: Secondary | ICD-10-CM | POA: Diagnosis not present

## 2024-02-02 DIAGNOSIS — D631 Anemia in chronic kidney disease: Secondary | ICD-10-CM | POA: Diagnosis not present

## 2024-02-03 DIAGNOSIS — E559 Vitamin D deficiency, unspecified: Secondary | ICD-10-CM | POA: Diagnosis not present

## 2024-02-03 DIAGNOSIS — E782 Mixed hyperlipidemia: Secondary | ICD-10-CM | POA: Diagnosis not present

## 2024-02-03 DIAGNOSIS — J302 Other seasonal allergic rhinitis: Secondary | ICD-10-CM | POA: Diagnosis not present

## 2024-02-03 DIAGNOSIS — J4540 Moderate persistent asthma, uncomplicated: Secondary | ICD-10-CM | POA: Diagnosis not present

## 2024-02-03 DIAGNOSIS — K5909 Other constipation: Secondary | ICD-10-CM | POA: Diagnosis not present

## 2024-02-03 DIAGNOSIS — Z794 Long term (current) use of insulin: Secondary | ICD-10-CM | POA: Diagnosis not present

## 2024-02-03 DIAGNOSIS — K219 Gastro-esophageal reflux disease without esophagitis: Secondary | ICD-10-CM | POA: Diagnosis not present

## 2024-02-03 DIAGNOSIS — E1165 Type 2 diabetes mellitus with hyperglycemia: Secondary | ICD-10-CM | POA: Diagnosis not present

## 2024-02-03 DIAGNOSIS — I10 Essential (primary) hypertension: Secondary | ICD-10-CM | POA: Diagnosis not present

## 2024-02-11 DIAGNOSIS — Z79621 Long term (current) use of calcineurin inhibitor: Secondary | ICD-10-CM | POA: Diagnosis not present

## 2024-02-11 DIAGNOSIS — G903 Multi-system degeneration of the autonomic nervous system: Secondary | ICD-10-CM | POA: Diagnosis not present

## 2024-02-11 DIAGNOSIS — Z940 Kidney transplant status: Secondary | ICD-10-CM | POA: Diagnosis not present

## 2024-02-11 DIAGNOSIS — Z794 Long term (current) use of insulin: Secondary | ICD-10-CM | POA: Diagnosis not present

## 2024-02-11 DIAGNOSIS — B259 Cytomegaloviral disease, unspecified: Secondary | ICD-10-CM | POA: Diagnosis not present

## 2024-02-11 DIAGNOSIS — Z4822 Encounter for aftercare following kidney transplant: Secondary | ICD-10-CM | POA: Diagnosis not present

## 2024-02-11 DIAGNOSIS — Z79899 Other long term (current) drug therapy: Secondary | ICD-10-CM | POA: Diagnosis not present

## 2024-02-11 DIAGNOSIS — Z5181 Encounter for therapeutic drug level monitoring: Secondary | ICD-10-CM | POA: Diagnosis not present

## 2024-02-11 DIAGNOSIS — E1165 Type 2 diabetes mellitus with hyperglycemia: Secondary | ICD-10-CM | POA: Diagnosis not present

## 2024-02-11 DIAGNOSIS — D849 Immunodeficiency, unspecified: Secondary | ICD-10-CM | POA: Diagnosis not present

## 2024-02-26 DIAGNOSIS — I951 Orthostatic hypotension: Secondary | ICD-10-CM | POA: Diagnosis not present

## 2024-02-26 DIAGNOSIS — E1165 Type 2 diabetes mellitus with hyperglycemia: Secondary | ICD-10-CM | POA: Diagnosis not present

## 2024-02-26 DIAGNOSIS — E049 Nontoxic goiter, unspecified: Secondary | ICD-10-CM | POA: Diagnosis not present

## 2024-02-26 DIAGNOSIS — E7800 Pure hypercholesterolemia, unspecified: Secondary | ICD-10-CM | POA: Diagnosis not present

## 2024-02-26 DIAGNOSIS — I10 Essential (primary) hypertension: Secondary | ICD-10-CM | POA: Diagnosis not present

## 2024-02-26 DIAGNOSIS — R809 Proteinuria, unspecified: Secondary | ICD-10-CM | POA: Diagnosis not present

## 2024-02-26 DIAGNOSIS — Z23 Encounter for immunization: Secondary | ICD-10-CM | POA: Diagnosis not present

## 2024-02-26 DIAGNOSIS — Z794 Long term (current) use of insulin: Secondary | ICD-10-CM | POA: Diagnosis not present

## 2024-02-26 DIAGNOSIS — E0840 Diabetes mellitus due to underlying condition with diabetic neuropathy, unspecified: Secondary | ICD-10-CM | POA: Diagnosis not present

## 2024-02-26 DIAGNOSIS — Z940 Kidney transplant status: Secondary | ICD-10-CM | POA: Diagnosis not present

## 2024-03-16 ENCOUNTER — Other Ambulatory Visit: Payer: Self-pay | Admitting: Cardiology

## 2024-03-16 DIAGNOSIS — I10 Essential (primary) hypertension: Secondary | ICD-10-CM

## 2024-03-25 DIAGNOSIS — N390 Urinary tract infection, site not specified: Secondary | ICD-10-CM | POA: Diagnosis not present

## 2024-03-29 NOTE — Progress Notes (Signed)
 Please let patient know I have sent an antibiotic for her positive urine culture. Thanks.

## 2024-04-13 DIAGNOSIS — E782 Mixed hyperlipidemia: Secondary | ICD-10-CM | POA: Diagnosis not present

## 2024-04-13 DIAGNOSIS — K5909 Other constipation: Secondary | ICD-10-CM | POA: Diagnosis not present

## 2024-04-13 DIAGNOSIS — Z794 Long term (current) use of insulin: Secondary | ICD-10-CM | POA: Diagnosis not present

## 2024-04-13 DIAGNOSIS — J302 Other seasonal allergic rhinitis: Secondary | ICD-10-CM | POA: Diagnosis not present

## 2024-04-13 DIAGNOSIS — J4540 Moderate persistent asthma, uncomplicated: Secondary | ICD-10-CM | POA: Diagnosis not present

## 2024-04-13 DIAGNOSIS — I10 Essential (primary) hypertension: Secondary | ICD-10-CM | POA: Diagnosis not present

## 2024-04-13 DIAGNOSIS — E559 Vitamin D deficiency, unspecified: Secondary | ICD-10-CM | POA: Diagnosis not present

## 2024-04-13 DIAGNOSIS — K219 Gastro-esophageal reflux disease without esophagitis: Secondary | ICD-10-CM | POA: Diagnosis not present

## 2024-04-13 DIAGNOSIS — E1165 Type 2 diabetes mellitus with hyperglycemia: Secondary | ICD-10-CM | POA: Diagnosis not present

## 2024-04-19 ENCOUNTER — Encounter: Payer: Self-pay | Admitting: Radiology
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
## 2024-05-10 ENCOUNTER — Inpatient Hospital Stay (HOSPITAL_COMMUNITY)
Admission: EM | Admit: 2024-05-10 | Discharge: 2024-05-13 | DRG: 312 | Disposition: A | Discharge status: Home / Self Care | Attending: Internal Medicine | Admitting: Internal Medicine

## 2024-05-10 ENCOUNTER — Emergency Department (HOSPITAL_COMMUNITY)

## 2024-05-10 ENCOUNTER — Other Ambulatory Visit: Payer: Self-pay

## 2024-05-10 ENCOUNTER — Encounter (HOSPITAL_COMMUNITY): Payer: Self-pay | Admitting: Internal Medicine

## 2024-05-10 ENCOUNTER — Encounter (HOSPITAL_COMMUNITY): Payer: Self-pay | Admitting: *Deleted

## 2024-05-10 DIAGNOSIS — N179 Acute kidney failure, unspecified: Secondary | ICD-10-CM | POA: Diagnosis present

## 2024-05-10 DIAGNOSIS — R7401 Elevation of levels of liver transaminase levels: Secondary | ICD-10-CM | POA: Diagnosis present

## 2024-05-10 DIAGNOSIS — E1165 Type 2 diabetes mellitus with hyperglycemia: Secondary | ICD-10-CM | POA: Diagnosis present

## 2024-05-10 DIAGNOSIS — E876 Hypokalemia: Secondary | ICD-10-CM | POA: Diagnosis present

## 2024-05-10 DIAGNOSIS — R112 Nausea with vomiting, unspecified: Secondary | ICD-10-CM | POA: Diagnosis present

## 2024-05-10 DIAGNOSIS — E873 Alkalosis: Secondary | ICD-10-CM | POA: Diagnosis present

## 2024-05-10 DIAGNOSIS — R030 Elevated blood-pressure reading, without diagnosis of hypertension: Secondary | ICD-10-CM | POA: Insufficient documentation

## 2024-05-10 DIAGNOSIS — T8610 Unspecified complication of kidney transplant: Secondary | ICD-10-CM

## 2024-05-10 DIAGNOSIS — I739 Peripheral vascular disease, unspecified: Secondary | ICD-10-CM | POA: Diagnosis present

## 2024-05-10 DIAGNOSIS — Z8249 Family history of ischemic heart disease and other diseases of the circulatory system: Secondary | ICD-10-CM

## 2024-05-10 DIAGNOSIS — E11319 Type 2 diabetes mellitus with unspecified diabetic retinopathy without macular edema: Secondary | ICD-10-CM | POA: Diagnosis present

## 2024-05-10 DIAGNOSIS — Z7985 Long-term (current) use of injectable non-insulin antidiabetic drugs: Secondary | ICD-10-CM

## 2024-05-10 DIAGNOSIS — Z95820 Peripheral vascular angioplasty status with implants and grafts: Secondary | ICD-10-CM

## 2024-05-10 DIAGNOSIS — Z87440 Personal history of urinary (tract) infections: Secondary | ICD-10-CM

## 2024-05-10 DIAGNOSIS — Z940 Kidney transplant status: Secondary | ICD-10-CM

## 2024-05-10 DIAGNOSIS — E7800 Pure hypercholesterolemia, unspecified: Secondary | ICD-10-CM | POA: Diagnosis present

## 2024-05-10 DIAGNOSIS — I708 Atherosclerosis of other arteries: Secondary | ICD-10-CM | POA: Diagnosis present

## 2024-05-10 DIAGNOSIS — Z8349 Family history of other endocrine, nutritional and metabolic diseases: Secondary | ICD-10-CM

## 2024-05-10 DIAGNOSIS — I10 Essential (primary) hypertension: Secondary | ICD-10-CM | POA: Diagnosis not present

## 2024-05-10 DIAGNOSIS — Y830 Surgical operation with transplant of whole organ as the cause of abnormal reaction of the patient, or of later complication, without mention of misadventure at the time of the procedure: Secondary | ICD-10-CM | POA: Diagnosis present

## 2024-05-10 DIAGNOSIS — Z79621 Long term (current) use of calcineurin inhibitor: Secondary | ICD-10-CM

## 2024-05-10 DIAGNOSIS — Z79899 Other long term (current) drug therapy: Secondary | ICD-10-CM

## 2024-05-10 DIAGNOSIS — E785 Hyperlipidemia, unspecified: Secondary | ICD-10-CM | POA: Diagnosis present

## 2024-05-10 DIAGNOSIS — Z89511 Acquired absence of right leg below knee: Secondary | ICD-10-CM

## 2024-05-10 DIAGNOSIS — E7849 Other hyperlipidemia: Secondary | ICD-10-CM | POA: Diagnosis present

## 2024-05-10 DIAGNOSIS — E1142 Type 2 diabetes mellitus with diabetic polyneuropathy: Secondary | ICD-10-CM | POA: Diagnosis present

## 2024-05-10 DIAGNOSIS — Z89021 Acquired absence of right finger(s): Secondary | ICD-10-CM

## 2024-05-10 DIAGNOSIS — K219 Gastro-esophageal reflux disease without esophagitis: Secondary | ICD-10-CM | POA: Diagnosis present

## 2024-05-10 DIAGNOSIS — I951 Orthostatic hypotension: Principal | ICD-10-CM | POA: Diagnosis present

## 2024-05-10 DIAGNOSIS — E119 Type 2 diabetes mellitus without complications: Secondary | ICD-10-CM | POA: Insufficient documentation

## 2024-05-10 DIAGNOSIS — E1151 Type 2 diabetes mellitus with diabetic peripheral angiopathy without gangrene: Secondary | ICD-10-CM | POA: Diagnosis present

## 2024-05-10 DIAGNOSIS — E871 Hypo-osmolality and hyponatremia: Secondary | ICD-10-CM | POA: Diagnosis present

## 2024-05-10 DIAGNOSIS — I5032 Chronic diastolic (congestive) heart failure: Secondary | ICD-10-CM | POA: Diagnosis present

## 2024-05-10 DIAGNOSIS — Z7902 Long term (current) use of antithrombotics/antiplatelets: Secondary | ICD-10-CM

## 2024-05-10 DIAGNOSIS — T8619 Other complication of kidney transplant: Secondary | ICD-10-CM | POA: Diagnosis present

## 2024-05-10 DIAGNOSIS — Z79624 Long term (current) use of inhibitors of nucleotide synthesis: Secondary | ICD-10-CM

## 2024-05-10 DIAGNOSIS — Z833 Family history of diabetes mellitus: Secondary | ICD-10-CM

## 2024-05-10 DIAGNOSIS — I110 Hypertensive heart disease with heart failure: Secondary | ICD-10-CM | POA: Diagnosis present

## 2024-05-10 DIAGNOSIS — E860 Dehydration: Secondary | ICD-10-CM | POA: Diagnosis present

## 2024-05-10 DIAGNOSIS — R55 Syncope and collapse: Principal | ICD-10-CM | POA: Diagnosis present

## 2024-05-10 DIAGNOSIS — Z794 Long term (current) use of insulin: Secondary | ICD-10-CM

## 2024-05-10 DIAGNOSIS — Z9049 Acquired absence of other specified parts of digestive tract: Secondary | ICD-10-CM

## 2024-05-10 DIAGNOSIS — E1169 Type 2 diabetes mellitus with other specified complication: Secondary | ICD-10-CM | POA: Diagnosis present

## 2024-05-10 DIAGNOSIS — R9431 Abnormal electrocardiogram [ECG] [EKG]: Secondary | ICD-10-CM | POA: Diagnosis present

## 2024-05-10 LAB — COMPREHENSIVE METABOLIC PANEL WITH GFR
ALT: 33 U/L (ref 0–44)
AST: 37 U/L (ref 15–41)
Albumin: 3.4 g/dL — ABNORMAL LOW (ref 3.5–5.0)
Alkaline Phosphatase: 157 U/L — ABNORMAL HIGH (ref 38–126)
Anion gap: 23 — ABNORMAL HIGH (ref 5–15)
BUN: 20 mg/dL (ref 6–20)
CO2: 25 mmol/L (ref 22–32)
Calcium: 9.4 mg/dL (ref 8.9–10.3)
Chloride: 90 mmol/L — ABNORMAL LOW (ref 98–111)
Creatinine, Ser: 2.04 mg/dL — ABNORMAL HIGH (ref 0.44–1.00)
GFR, Estimated: 28 mL/min — ABNORMAL LOW (ref >60)
Glucose, Bld: 258 mg/dL — ABNORMAL HIGH (ref 70–99)
Potassium: 2.8 mmol/L — ABNORMAL LOW (ref 3.5–5.1)
Sodium: 138 mmol/L (ref 135–145)
Total Bilirubin: 0.9 mg/dL (ref 0.0–1.2)
Total Protein: 7.6 g/dL (ref 6.5–8.1)

## 2024-05-10 LAB — CBC WITH DIFFERENTIAL/PLATELET
Abs Immature Granulocytes: 0.02 K/uL (ref 0.00–0.07)
Basophils Absolute: 0 K/uL (ref 0.0–0.1)
Basophils Relative: 0 %
Eosinophils Absolute: 0 K/uL (ref 0.0–0.5)
Eosinophils Relative: 0 %
HCT: 43.4 % (ref 36.0–46.0)
Hemoglobin: 13.5 g/dL (ref 12.0–15.0)
Immature Granulocytes: 0 %
Lymphocytes Relative: 17 %
Lymphs Abs: 1.2 K/uL (ref 0.7–4.0)
MCH: 24.4 pg — ABNORMAL LOW (ref 26.0–34.0)
MCHC: 31.1 g/dL (ref 30.0–36.0)
MCV: 78.3 fL — ABNORMAL LOW (ref 80.0–100.0)
Monocytes Absolute: 0.7 K/uL (ref 0.1–1.0)
Monocytes Relative: 10 %
Neutro Abs: 5.1 K/uL (ref 1.7–7.7)
Neutrophils Relative %: 73 %
Platelets: 251 K/uL (ref 150–400)
RBC: 5.54 MIL/uL — ABNORMAL HIGH (ref 3.87–5.11)
RDW: 13.4 % (ref 11.5–15.5)
WBC: 7.2 K/uL (ref 4.0–10.5)
nRBC: 0 % (ref 0.0–0.2)

## 2024-05-10 LAB — URINALYSIS, W/ REFLEX TO CULTURE (INFECTION SUSPECTED)
Bilirubin Urine: NEGATIVE
Glucose, UA: NEGATIVE mg/dL
Ketones, ur: NEGATIVE mg/dL
Leukocytes,Ua: NEGATIVE
Nitrite: NEGATIVE
Protein, ur: 100 mg/dL — AB
Specific Gravity, Urine: 1.008 (ref 1.005–1.030)
pH: 6 (ref 5.0–8.0)

## 2024-05-10 LAB — CK: Total CK: 67 U/L (ref 38–234)

## 2024-05-10 LAB — TROPONIN I (HIGH SENSITIVITY)
Troponin I (High Sensitivity): 29 ng/L — ABNORMAL HIGH (ref <18)
Troponin I (High Sensitivity): 32 ng/L — ABNORMAL HIGH (ref <18)

## 2024-05-10 LAB — I-STAT VENOUS BLOOD GAS, ED
Acid-Base Excess: 6 mmol/L — ABNORMAL HIGH (ref 0.0–2.0)
Bicarbonate: 29.2 mmol/L — ABNORMAL HIGH (ref 20.0–28.0)
Calcium, Ion: 0.94 mmol/L — ABNORMAL LOW (ref 1.15–1.40)
HCT: 45 % (ref 36.0–46.0)
Hemoglobin: 15.3 g/dL — ABNORMAL HIGH (ref 12.0–15.0)
O2 Saturation: 100 %
Potassium: 2.7 mmol/L — CL (ref 3.5–5.1)
Sodium: 134 mmol/L — ABNORMAL LOW (ref 135–145)
TCO2: 30 mmol/L (ref 22–32)
pCO2, Ven: 37.2 mmHg — ABNORMAL LOW (ref 44–60)
pH, Ven: 7.503 — ABNORMAL HIGH (ref 7.25–7.43)
pO2, Ven: 186 mmHg — ABNORMAL HIGH (ref 32–45)

## 2024-05-10 LAB — I-STAT CHEM 8, ED
BUN: 22 mg/dL — ABNORMAL HIGH (ref 6–20)
Calcium, Ion: 0.95 mmol/L — ABNORMAL LOW (ref 1.15–1.40)
Chloride: 95 mmol/L — ABNORMAL LOW (ref 98–111)
Creatinine, Ser: 2 mg/dL — ABNORMAL HIGH (ref 0.44–1.00)
Glucose, Bld: 263 mg/dL — ABNORMAL HIGH (ref 70–99)
HCT: 46 % (ref 36.0–46.0)
Hemoglobin: 15.6 g/dL — ABNORMAL HIGH (ref 12.0–15.0)
Potassium: 2.6 mmol/L — CL (ref 3.5–5.1)
Sodium: 135 mmol/L (ref 135–145)
TCO2: 28 mmol/L (ref 22–32)

## 2024-05-10 LAB — MAGNESIUM: Magnesium: 1.8 mg/dL (ref 1.7–2.4)

## 2024-05-10 LAB — BASIC METABOLIC PANEL WITH GFR
Anion gap: 20 — ABNORMAL HIGH (ref 5–15)
BUN: 15 mg/dL (ref 6–20)
CO2: 24 mmol/L (ref 22–32)
Calcium: 8.7 mg/dL — ABNORMAL LOW (ref 8.9–10.3)
Chloride: 98 mmol/L (ref 98–111)
Creatinine, Ser: 1.63 mg/dL — ABNORMAL HIGH (ref 0.44–1.00)
GFR, Estimated: 37 mL/min — ABNORMAL LOW (ref >60)
Glucose, Bld: 142 mg/dL — ABNORMAL HIGH (ref 70–99)
Potassium: 3.3 mmol/L — ABNORMAL LOW (ref 3.5–5.1)
Sodium: 142 mmol/L (ref 135–145)

## 2024-05-10 LAB — RESP PANEL BY RT-PCR (RSV, FLU A&B, COVID)  RVPGX2
Influenza A by PCR: NEGATIVE
Influenza B by PCR: NEGATIVE
Resp Syncytial Virus by PCR: NEGATIVE
SARS Coronavirus 2 by RT PCR: NEGATIVE

## 2024-05-10 LAB — CBG MONITORING, ED
Glucose-Capillary: 146 mg/dL — ABNORMAL HIGH (ref 70–99)
Glucose-Capillary: 142 mg/dL — ABNORMAL HIGH (ref 70–99)

## 2024-05-10 LAB — BETA-HYDROXYBUTYRIC ACID: Beta-Hydroxybutyric Acid: 2.26 mmol/L — ABNORMAL HIGH (ref 0.05–0.27)

## 2024-05-10 LAB — OSMOLALITY: Osmolality: 300 mosm/kg — ABNORMAL HIGH (ref 275–295)

## 2024-05-10 MED ORDER — TACROLIMUS 1 MG PO CAPS
2.0000 mg | ORAL_CAPSULE | Freq: Two times a day (BID) | ORAL | Status: DC
  Administered 2024-05-11 – 2024-05-13 (×5): 2 mg via ORAL
  Filled 2024-05-10 (×6): qty 2

## 2024-05-10 MED ORDER — POTASSIUM CHLORIDE 10 MEQ/100ML IV SOLN
10.0000 meq | INTRAVENOUS | Status: AC
  Administered 2024-05-10 (×3): 10 meq via INTRAVENOUS
  Filled 2024-05-10 (×3): qty 100

## 2024-05-10 MED ORDER — ACETAMINOPHEN 650 MG RE SUPP: 650.0000 mg | Freq: Four times a day (QID) | RECTAL | Status: DC | PRN

## 2024-05-10 MED ORDER — HEPARIN SODIUM (PORCINE) 5000 UNIT/ML IJ SOLN
5000.0000 [IU] | Freq: Three times a day (TID) | INTRAMUSCULAR | Status: DC
  Administered 2024-05-10 – 2024-05-12 (×6): 5000 [IU] via SUBCUTANEOUS
  Filled 2024-05-10 (×6): qty 1

## 2024-05-10 MED ORDER — MAGNESIUM SULFATE 2 GM/50ML IV SOLN
2.0000 g | Freq: Once | INTRAVENOUS | Status: AC
  Administered 2024-05-10: 2 g via INTRAVENOUS
  Filled 2024-05-10: qty 50

## 2024-05-10 MED ORDER — METOPROLOL SUCCINATE ER 25 MG PO TB24: 50.0000 mg | ORAL_TABLET | Freq: Every day | ORAL | Status: DC

## 2024-05-10 MED ORDER — MYCOPHENOLATE SODIUM 180 MG PO TBEC
540.0000 mg | DELAYED_RELEASE_TABLET | Freq: Two times a day (BID) | ORAL | Status: DC
  Administered 2024-05-11: 540 mg via ORAL
  Filled 2024-05-10: qty 3

## 2024-05-10 MED ORDER — INSULIN GLARGINE-YFGN 100 UNIT/ML ~~LOC~~ SOLN
16.0000 [IU] | Freq: Every day | SUBCUTANEOUS | Status: DC
  Administered 2024-05-12 – 2024-05-13 (×2): 16 [IU] via SUBCUTANEOUS
  Filled 2024-05-10 (×3): qty 0.16

## 2024-05-10 MED ORDER — DOCUSATE SODIUM 100 MG PO CAPS
100.0000 mg | ORAL_CAPSULE | Freq: Two times a day (BID) | ORAL | Status: DC
  Administered 2024-05-11 – 2024-05-13 (×3): 100 mg via ORAL
  Filled 2024-05-10 (×4): qty 1

## 2024-05-10 MED ORDER — TRIMETHOBENZAMIDE HCL 100 MG/ML IM SOLN: 200.0000 mg | Freq: Three times a day (TID) | INTRAMUSCULAR | Status: DC | PRN

## 2024-05-10 MED ORDER — POTASSIUM CHLORIDE 10 MEQ/100ML IV SOLN: 10.0000 meq | INTRAVENOUS | Status: DC

## 2024-05-10 MED ORDER — INSULIN ASPART 100 UNIT/ML IJ SOLN
0.0000 [IU] | Freq: Every day | INTRAMUSCULAR | Status: DC
  Administered 2024-05-12: 2 [IU] via SUBCUTANEOUS
  Filled 2024-05-10: qty 2

## 2024-05-10 MED ORDER — SODIUM CHLORIDE 0.9% FLUSH
3.0000 mL | INTRAVENOUS | Status: DC | PRN
  Administered 2024-05-11: 3 mL via INTRAVENOUS

## 2024-05-10 MED ORDER — HYDRALAZINE HCL 20 MG/ML IJ SOLN: 5.0000 mg | Freq: Four times a day (QID) | INTRAMUSCULAR | Status: DC | PRN

## 2024-05-10 MED ORDER — HYDRALAZINE HCL 20 MG/ML IJ SOLN
5.0000 mg | Freq: Once | INTRAMUSCULAR | Status: DC
  Filled 2024-05-10: qty 1

## 2024-05-10 MED ORDER — SODIUM CHLORIDE 0.9 % IV BOLUS
500.0000 mL | Freq: Once | INTRAVENOUS | Status: AC
  Administered 2024-05-10: 500 mL via INTRAVENOUS

## 2024-05-10 MED ORDER — INSULIN DETEMIR 100 UNIT/ML FLEXPEN: 16.0000 [IU] | PEN_INJECTOR | Freq: Every day | SUBCUTANEOUS | Status: DC

## 2024-05-10 MED ORDER — ROSUVASTATIN CALCIUM 20 MG PO TABS
20.0000 mg | ORAL_TABLET | Freq: Every day | ORAL | Status: DC
  Administered 2024-05-11 – 2024-05-13 (×3): 20 mg via ORAL
  Filled 2024-05-10 (×3): qty 1

## 2024-05-10 MED ORDER — CLOPIDOGREL BISULFATE 75 MG PO TABS: 75.0000 mg | ORAL_TABLET | Freq: Every day | ORAL | Status: DC

## 2024-05-10 MED ORDER — POTASSIUM CHLORIDE 10 MEQ/100ML IV SOLN
10.0000 meq | INTRAVENOUS | Status: AC
  Administered 2024-05-10 – 2024-05-11 (×4): 10 meq via INTRAVENOUS
  Filled 2024-05-10 (×4): qty 100

## 2024-05-10 MED ORDER — INSULIN ASPART 100 UNIT/ML IJ SOLN
0.0000 [IU] | Freq: Three times a day (TID) | INTRAMUSCULAR | Status: DC
  Administered 2024-05-11 – 2024-05-12 (×2): 1 [IU] via SUBCUTANEOUS
  Administered 2024-05-12: 2 [IU] via SUBCUTANEOUS
  Administered 2024-05-12: 1 [IU] via SUBCUTANEOUS
  Administered 2024-05-13: 2 [IU] via SUBCUTANEOUS
  Filled 2024-05-10: qty 1
  Filled 2024-05-10 (×2): qty 2
  Filled 2024-05-10 (×2): qty 1

## 2024-05-10 MED ORDER — ACETAMINOPHEN 325 MG PO TABS: 650.0000 mg | ORAL_TABLET | Freq: Four times a day (QID) | ORAL | Status: DC | PRN

## 2024-05-10 MED ORDER — SODIUM CHLORIDE 0.9% FLUSH
3.0000 mL | Freq: Two times a day (BID) | INTRAVENOUS | Status: DC
  Administered 2024-05-11 – 2024-05-13 (×4): 3 mL via INTRAVENOUS

## 2024-05-10 MED ORDER — SODIUM CHLORIDE 0.9 % IV BOLUS
1000.0000 mL | Freq: Once | INTRAVENOUS | Status: AC
  Administered 2024-05-10: 1000 mL via INTRAVENOUS

## 2024-05-10 MED ORDER — PANTOPRAZOLE SODIUM 40 MG PO TBEC
40.0000 mg | DELAYED_RELEASE_TABLET | Freq: Every day | ORAL | Status: DC
  Administered 2024-05-11 – 2024-05-12 (×2): 40 mg via ORAL
  Filled 2024-05-10 (×3): qty 1

## 2024-05-10 MED ORDER — LACTATED RINGERS IV SOLN: INTRAVENOUS | Status: DC

## 2024-05-10 MED ORDER — SODIUM CHLORIDE 0.9% FLUSH
3.0000 mL | Freq: Two times a day (BID) | INTRAVENOUS | Status: DC
  Administered 2024-05-12 – 2024-05-13 (×2): 3 mL via INTRAVENOUS

## 2024-05-10 MED ORDER — SODIUM CHLORIDE 0.9 % IV SOLN: 250.0000 mL | INTRAVENOUS | Status: AC | PRN

## 2024-05-10 NOTE — ED Notes (Signed)
 EKG completed

## 2024-05-10 NOTE — H&P (Signed)
 History and Physical    [name] FMW:[id number] DOB: 1971-04-15 DOA: 05/10/2024  PCP: Catalina Bare, MD   Patient coming from: Home   Chief Complaint:  Chief Complaint  Patient presents with   Loss of Consciousness   ED TRIAGE note:  Pt. BIB [facility] from home with c/o syncopal episodes; Per [facility] the pt. Was on her way to the UC today, she passed out on her way to the car, and according to family, she was unresponsive for a couple of mins and also throwing up. Pt. Was A/O x4 upon their arrival; Pt. 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 Pt. States that she has been feeling this dizziness and N/V for the past week. Pt. Has a 20G in L AC.             HPI:  [name] is a 53 y.o. female with medical history significant of history of recurrent syncope, ESRD secondary to essential hypertension diabetes previously on dialysis status post SCD DDKT right pelvis 01/2023, essential hypertension, chronic orthostatic hypotension, history of recurrent UTI/sinusitis/URI, insulin -dependent DM type Il, hyperlipidemia, orthostatic hypotension due to diabetic neuropathy, diabetic retinopathy, severe PAD with right common Ilac artery stent 2017 and restenting again 2021, right BKA 2007 status post prosthesis, left SFA angioplasty February 2019 presented to emergency department via EMS for evaluation for syncope.  Patient reported that she has recurrent orthostatic hypotension over the course of last 1 week with significant increase of dizziness.  Patient reported unable to stand without feeling dizziness/lightheadedness.  She was going to urgent care called her sister to come and get her.    While she was sitting in the chair patient suddenly slumped over drooling and gasping for air.  When she came become conscious after a minute or 2 minutes multiple episodes of vomiting.  Sister reported patient has some pink frothy sputum while drooling during the unconscious status. reported she has been  recently treated [facility] for UTI.  Patient denies any head injury during the fall.  Denies any blurry vision, weakness, tingling, tremor. Patient also reported for last couple of days she is also having some vomiting.  Denies any fever, chill and diarrhea.   ED Course:  At presentation to ED patient found to have elevated blood pressure 197/89 which improved to 147/86.  Heart rate in between 102-104.  Afebrile.  O2 sat 100% room air.  Lab, CBC unremarkable. CMP showed low potassium 2.8, low chloride 90, elevated blood glucose 258, elevated creatinine 2.04, elevated AST 157 and low GFR 28, normal bicarb level 25. Normal CK.  Normal mag.  Slight elevated serum Osmo to 300.  Respiratory panel negative for COVID RSV flu.  Pending FOBT. Repeat BMP showing is still low potassium 2.8, and elevated creatinine 2. Flat troponin 29 and 32. Elevated beta-hydroxybutyrate level 2.28.  Normal blood glucose level 146.   CT head no acute intercranial abnormality. CT chest abdomen pelvis unremarkable. Chest x-ray unremarkable.  EKG showing sinus tachycardia heart rate 115 and prolonged QTc 554 ms.  In the ED patient received 2 L of NS bolus and IV KCl 30 mEq.  Due to prolonged QTc patient received mag sulfate in the ED.  Hospitalist consulted for further evaluation management of syncope, hypokalemia, AKI, and elevated blood pressure.   Significant labs in the ED: Lab Orders         Resp panel by RT-PCR (RSV, Flu A&B, Covid) Anterior Nasal Swab  Urine Culture         CBC with Differential         Comprehensive metabolic panel         CK         Magnesium          Urinalysis, w/ Reflex to Culture (Infection Suspected) -Urine, Clean Catch         Beta-hydroxybutyric acid         Osmolality         Basic metabolic panel         Sodium, urine, random         Creatinine, urine, random         HIV Antibody (routine testing w rflx)         Hemoglobin A1c         Comprehensive metabolic  panel         CBC         Tacrolimus  level         I-stat chem 8, ED (not at [facility], [facility] or [facility])         POC occult blood, ED         I-Stat venous blood gas, ([facility] ED, [facility], [facility])         POC CBG, ED       Review of Systems:  Review of Systems  Constitutional:  Negative for chills, fever, malaise/fatigue and weight loss.  Respiratory:  Negative for cough, sputum production and shortness of breath.   Cardiovascular:  Negative for chest pain, palpitations, orthopnea, claudication and leg swelling.  Gastrointestinal:  Positive for nausea and vomiting. Negative for abdominal pain, constipation, diarrhea and heartburn.  Genitourinary:  Negative for dysuria, flank pain, frequency and urgency.  Musculoskeletal:  Negative for back pain, joint pain, myalgias and neck pain.  Neurological:  Positive for dizziness and loss of consciousness. Negative for tingling, tremors, sensory change, speech change, focal weakness, seizures, weakness and headaches.  Psychiatric/Behavioral:  The patient is not nervous/anxious.     Past Medical History:  Diagnosis Date   Anemia    Below knee amputation (HCC)    RIGHT   CHF (congestive heart failure) (HCC)    PT UNAWARE OF THIS DX   Chronic kidney disease    PAST HX OF ESRD- HAD KIDNEY TRANSPLANT 01-24-2013   Critical lower limb ischemia (HCC) 10/05/2015   Enlarged heart    WITH IRREGULAR HEART RATE   GERD (gastroesophageal reflux disease)    High cholesterol    History of blood transfusion    related to kidney transplant   Hypercholesteremia 08/14/2006   Qualifier: Diagnosis of  By: [name]     Hypertension    Kidney transplant recipient    01/24/2013   Mechanical complication of other vascular device, implant, and graft 09/22/2012   Metabolic bone disease    Orthostatic hypotension    PAD (peripheral artery disease)    PT UNAWARE OF THIS DIAGNOSIS   Pericardial effusion    Retinopathy    Type II diabetes mellitus (HCC)    controlled  with diet    Past Surgical History:  Procedure Laterality Date   AMPUTATION Right 10/13/2015   Procedure: Right Transmetatarsal Amputation;  Surgeon: Jerona Harden GAILS, MD;  Location: [facility] OR;  Service: Orthopedics;  Laterality: Right;   AMPUTATION Right 11/29/2015   Procedure: RIGHT BELOW KNEE AMPUTATION;  Surgeon: Jerona GAILS Harden, MD;  Location: [facility] OR;  Service: Orthopedics;  Laterality: Right;  AMPUTATION FINGER     Rt hand middle finger   AV FISTULA PLACEMENT  10/04/2011   Procedure: INSERTION OF ARTERIOVENOUS (AV) GORE-TEX GRAFT ARM;  Surgeon: Lonni GORMAN Blade, MD;  Location: [facility] OR;  Service: Vascular;  Laterality: Left;  Insertion left upper arm Arteriovenous goretex graft   AV FISTULA PLACEMENT  10/29/2011   Procedure: ARTERIOVENOUS (AV) FISTULA CREATION;  Surgeon: Lonni GORMAN Blade, MD;  Location: [facility] OR;  Service: Vascular;  Laterality: Right;  Creation Right Arteriovenous Fistula    AVGG REMOVAL  10/04/2011   Procedure: REMOVAL OF ARTERIOVENOUS GORETEX GRAFT (AVGG);  Surgeon: Carlin FORBES Haddock, MD;  Location: [facility] OR;  Service: Vascular;  Laterality: Left;   BELOW THE KNEE AMPUTATION Right    BLADDER SUSPENSION     03/08/2022   CHOLECYSTECTOMY N/A 12/08/2015   Procedure: LAPAROSCOPIC CHOLECYSTECTOMY WITH INTRAOPERATIVE CHOLANGIOGRAM;  Surgeon: Jina Nephew, MD;  Location: [facility] OR;  Service: General;  Laterality: N/A;   ESOPHAGOGASTRODUODENOSCOPY N/A 12/24/2012   Procedure: ESOPHAGOGASTRODUODENOSCOPY (EGD);  Surgeon: Toribio SHAUNNA Cedar, MD;  Location: [facility] ENDOSCOPY;  Service: Endoscopy;  Laterality: N/A;   FINGER AMPUTATION Right 02/26/2013   3rd finger   INSERTION OF DIALYSIS CATHETER  10/04/2011   Procedure: INSERTION OF DIALYSIS CATHETER;  Surgeon: Lonni GORMAN Blade, MD;  Location: [facility] OR;  Service: Vascular;  Laterality: Right;  insertion of dialysis catheter right internal jugular   INSERTION OF DIALYSIS CATHETER  06/23/2012   Procedure: INSERTION OF DIALYSIS CATHETER;   Surgeon: Lonni GORMAN Blade, MD;  Location: [facility] OR;  Service: Vascular;  Laterality: N/A;  Ultrasound guided   KIDNEY TRANSPLANT  01/24/2013   LOWER EXTREMITY ANGIOGRAPHY Left 08/12/2016   Procedure: Lower Extremity Angiography;  Surgeon: Selinda GORMAN Gu, MD;  Location: [facility] INVASIVE CV LAB;  Service: Cardiovascular;  Laterality: Left;   LOWER EXTREMITY INTERVENTION  08/12/2016   Procedure: Lower Extremity Intervention;  Surgeon: Selinda GORMAN Gu, MD;  Location: [facility] INVASIVE CV LAB;  Service: Cardiovascular;;   NEPHRECTOMY TRANSPLANTED ORGAN     PATCH ANGIOPLASTY  06/23/2012   Procedure: PATCH ANGIOPLASTY;  Surgeon: Lonni GORMAN Blade, MD;  Location: [facility] OR;  Service: Vascular;  Laterality: Right;   PERIPHERAL VASCULAR CATHETERIZATION N/A 09/19/2015   Procedure: Lower Extremity Angiography;  Surgeon: Gordy Bergamo, MD;  Location: [facility] INVASIVE CV LAB;  Service: Cardiovascular;  Laterality: N/A;   PERIPHERAL VASCULAR CATHETERIZATION N/A 09/19/2015   Procedure: Abdominal Aortogram;  Surgeon: Gordy Bergamo, MD;  Location: [facility] INVASIVE CV LAB;  Service: Cardiovascular;  Laterality: N/A;   PERIPHERAL VASCULAR CATHETERIZATION Right 09/19/2015   Procedure: Peripheral Vascular Intervention;  Surgeon: Gordy Bergamo, MD;  Location: [facility] INVASIVE CV LAB;  Service: Cardiovascular;  Laterality: Right;  Right Common  Iliac   PERIPHERAL VASCULAR CATHETERIZATION N/A 10/06/2015   Procedure: Lower Extremity Angiography;  Surgeon: Gordy Bergamo, MD;  Location: [facility] INVASIVE CV LAB;  Service: Cardiovascular;  Laterality: N/A;   PERIPHERAL VASCULAR CATHETERIZATION  10/06/2015   Procedure: Peripheral Vascular Intervention;  Surgeon: Gordy Bergamo, MD;  Location: [facility] INVASIVE CV LAB;  Service: Cardiovascular;;  REIA  Omnilink 6.0x29, innova 7x80  RSFA innova 5x80   REVISON OF ARTERIOVENOUS FISTULA  06/23/2012   Procedure: REVISON OF ARTERIOVENOUS FISTULA;  Surgeon: Lonni GORMAN Blade, MD;  Location: [facility] OR;  Service: Vascular;  Laterality: Right;   Ultrasound guided   SHUNTOGRAM N/A 04/06/2012   Procedure: RICH;  Surgeon: Lonni GORMAN Blade, MD;  Location: [facility] CATH LAB;  Service: Cardiovascular;  Laterality: N/A;     reports  that she has never smoked. She has never used smokeless tobacco. She reports that she does not drink alcohol and does not use drugs.  No Known Allergies  Family History  Problem Relation Age of Onset   Malignant hyperthermia Mother    Thyroid  disease Mother    Hypertension Mother    Malignant hyperthermia Father    Hypertension Father    Diabetes Brother    Anesthesia problems Neg Hx    Colon cancer Neg Hx    Esophageal cancer Neg Hx    Colon polyps Neg Hx    Rectal cancer Neg Hx    Stomach cancer Neg Hx     Prior to Admission medications   Medication Sig Start Date End Date Taking? Authorizing Provider  albuterol  (ACCUNEB ) 0.63 MG/3ML nebulizer solution Take 1 ampule by nebulization every 6 (six) hours as needed for wheezing or shortness of breath.  11/07/18   [provider]  albuterol  (PROVENTIL  HFA;VENTOLIN  HFA) 108 (90 BASE) MCG/ACT inhaler Inhale 1 puff into the lungs every 6 (six) hours as needed for wheezing or shortness of breath.    [provider]  amLODipine  (NORVASC ) 10 MG tablet Take 1 tablet (10 mg total) by mouth daily. Take one extra tablet by mouth daily as needed for blood pressure >140/80 12/11/23   [name], MD  cloNIDine  (CATAPRES  - DOSED IN MG/24 HR) 0.2 mg/24hr patch APPLY 1 PATCH TOPICALLY ONCE A WEEK 10/22/23   [name], MD  clopidogrel  (PLAVIX ) 75 MG tablet Take 1 tablet (75 mg total) by mouth daily. 12/11/23   [name], MD  diphenoxylate-atropine (LOMOTIL) 2.5-0.025 MG tablet Take 1 tablet by mouth 3 (three) times daily as needed for diarrhea or loose stools.  05/31/19   [provider]  fluticasone  (FLONASE ) 50 MCG/ACT nasal spray Place 2 sprays into both nostrils daily as needed for allergies. 06/22/22   White, Shelba SAUNDERS, NP  furosemide   (LASIX ) 40 MG tablet Take 40 mg by mouth daily.  12/25/16   [provider]  insulin  detemir (LEVEMIR  FLEXPEN) 100 UNIT/ML FlexPen Inject 16-24 Units into the skin daily.    [provider]  Insulin  Pen Needle (NOVOFINE) 30G X 8 MM MISC Inject 10 each into the skin as needed. 07/21/22   Stanhope, Catharine M, FNP  methenamine (HIPREX) 1 g tablet Take 1 g by mouth 2 (two) times daily with a meal.    [provider]  metoprolol  succinate (TOPROL -XL) 50 MG 24 hr tablet TAKE ONE TABLET BY MOUTH ONCE DAILY with OR immediately following a meal. 03/17/24   [name], MD  Multiple Vitamins-Minerals (WOMENS MULTIVITAMIN PO) Take by mouth.    [provider]  mycophenolate  (MYFORTIC ) 180 MG EC tablet Take 540 mg by mouth 2 (two) times daily. (540mg ) in the morning and bedtime-( 3 capsules )    [provider]  pantoprazole  (PROTONIX ) 40 MG tablet Take 40 mg by mouth at bedtime.     [provider]  rosuvastatin  (CRESTOR ) 20 MG tablet Take 1 tablet (20 mg total) by mouth daily. 12/11/23   [name], MD  SEMGLEE , YFGN, 100 UNIT/ML Pen Inject 10 Units into the skin 2 (two) times daily. 09/04/23   [provider]  tacrolimus  (PROGRAF ) 1 MG capsule Take 2 mg by mouth 2 (two) times daily.     [provider]  tirzepatide CLOYDE) 10 MG/0.5ML Pen Inject 2.5 mg into the skin once a week.    [provider]  Physical Exam: Vitals:   05/10/24 1430 05/10/24 1445 05/10/24 1643 05/10/24 2030  BP: (!) 172/79 (!) 197/89 (!) 145/86 (!) 188/82  Pulse: (!) 108 (!) 107 (!) 104 (!) 104  Resp: 13 15 15 17   Temp:   97.6 F (36.4 C)   TempSrc:   Oral   SpO2: 100% 100% 100% 99%  Weight:      Height:        Physical Exam Vitals and nursing note reviewed.  Constitutional:      General: She is not in acute distress.    Appearance: She is ill-appearing.  HENT:     Mouth/Throat:     Mouth: Mucous membranes are dry.  Eyes:     Pupils:  Pupils are equal, round, and reactive to light.  Cardiovascular:     Rate and Rhythm: Regular rhythm. Tachycardia present.     Pulses: Normal pulses.     Heart sounds: Normal heart sounds.  Pulmonary:     Effort: Pulmonary effort is normal.     Breath sounds: Normal breath sounds.  Abdominal:     Palpations: Abdomen is soft.     Tenderness: There is no abdominal tenderness. There is no guarding or rebound.  Musculoskeletal:     Cervical back: Neck supple.     Right lower leg: No edema.     Left lower leg: No edema.  Skin:    General: Skin is dry.     Capillary Refill: Capillary refill takes less than 2 seconds.  Neurological:     Mental Status: She is oriented to person, place, and time.  Psychiatric:        Mood and Affect: Mood normal.      Labs on Admission: I have personally reviewed following labs and imaging studies  CBC: Recent Labs  Lab 05/10/24 1435 05/10/24 1502 05/10/24 1706  WBC 7.2  --   --   NEUTROABS 5.1  --   --   HGB 13.5 15.6* 15.3*  HCT 43.4 46.0 45.0  MCV 78.3*  --   --   PLT 251  --   --    Basic Metabolic Panel: Recent Labs  Lab 05/10/24 1435 05/10/24 1502 05/10/24 1706  NA 138 135 134*  K 2.8* 2.6* 2.7*  CL 90* 95*  --   CO2 25  --   --   GLUCOSE 258* 263*  --   BUN 20 22*  --   CREATININE 2.04* 2.00*  --   CALCIUM  9.4  --   --   MG 1.8  --   --    GFR: Estimated Creatinine Clearance: 27.4 mL/min (A) (by C-G formula based on SCr of 2 mg/dL (H)). Liver Function Tests: Recent Labs  Lab 05/10/24 1435  AST 37  ALT 33  ALKPHOS 157*  BILITOT 0.9  PROT 7.6  ALBUMIN  3.4*   No results for input(s): LIPASE, AMYLASE in the last 168 hours. No results for input(s): AMMONIA in the last 168 hours. Coagulation Profile: No results for input(s): INR, PROTIME in the last 168 hours. Cardiac Enzymes: Recent Labs  Lab 05/10/24 1435 05/10/24 1622 05/10/24 1730  CKTOTAL 67  --   --   TROPONINIHS  --  29* 32*   BNP (last 3  results) No results for input(s): BNP in the last 8760 hours. HbA1C: No results for input(s): HGBA1C in the last 72 hours. CBG: Recent Labs  Lab 05/10/24 1848  GLUCAP 146*   Lipid Profile: No results for  input(s): CHOL, HDL, LDLCALC, TRIG, CHOLHDL, LDLDIRECT in the last 72 hours. Thyroid  Function Tests: No results for input(s): TSH, T4TOTAL, FREET4, T3FREE, THYROIDAB in the last 72 hours. Anemia Panel: No results for input(s): VITAMINB12, FOLATE, FERRITIN, TIBC, IRON, RETICCTPCT in the last 72 hours. Urine analysis:    Component Value Date/Time   COLORURINE YELLOW 05/10/2024 1715   APPEARANCEUR HAZY (A) 05/10/2024 1715   LABSPEC 1.008 05/10/2024 1715   PHURINE 6.0 05/10/2024 1715   GLUCOSEU NEGATIVE 05/10/2024 1715   HGBUR SMALL (A) 05/10/2024 1715   BILIRUBINUR NEGATIVE 05/10/2024 1715   KETONESUR NEGATIVE 05/10/2024 1715   PROTEINUR 100 (A) 05/10/2024 1715   UROBILINOGEN 0.2 07/21/2022 1559   NITRITE NEGATIVE 05/10/2024 1715   LEUKOCYTESUR NEGATIVE 05/10/2024 1715    Radiological Exams on Admission: I have personally reviewed images CT CHEST ABDOMEN PELVIS WO CONTRAST Result Date: 05/10/2024 EXAM: CT CHEST, ABDOMEN AND PELVIS WITHOUT CONTRAST 05/10/2024 07:26:19 PM TECHNIQUE: CT of the chest, abdomen and pelvis was performed without the administration of intravenous contrast. Multiplanar reformatted images are provided for review. Automated exposure control, iterative reconstruction, and/or weight based adjustment of the mA/kV was utilized to reduce the radiation dose to as low as reasonably achievable. COMPARISON: 04/02/2015 CLINICAL HISTORY: Chest and abdominal pain. FINDINGS: LIMITATIONS: Somewhat limited due to lack of IV contrast. CHEST: MEDIASTINUM AND LYMPH NODES: Heart is at the upper limits of normal in size. Minimal pericardial effusion is seen. Mild coronary calcifications are seen. The central airways are clear. No mediastinal,  hilar or axillary lymphadenopathy. The esophagus, as visualized, is within normal limits. Thoracic inlet is within normal limits. LUNGS AND PLEURA: The lungs are well aerated bilaterally. No focal infiltrate or pulmonary edema. No pleural effusion or pneumothorax. No sizable parenchymal nodule is seen. ABDOMEN AND PELVIS: LIVER: The liver is within normal limits. GALLBLADDER AND BILE DUCTS: The gallbladder has been surgically removed. No biliary ductal dilatation. SPLEEN: The spleen is unremarkable. PANCREAS: The pancreas is unremarkable. ADRENAL GLANDS: The adrenal glands are within normal limits. KIDNEYS, URETERS AND BLADDER: The native kidneys are small. No obstructive changes are seen in the native kidneys. A right iliac fossa renal transplant is seen without obstructive change. No stones in the kidneys or ureters. The bladder is unremarkable. GI AND BOWEL: Stomach and small bowel are within normal limits. Scattered fecal material is noted throughout the colon. No obstructive or inflammatory changes are noted in the colon. The appendix is within normal limits. REPRODUCTIVE ORGANS: The uterus is within normal limits. PERITONEUM AND RETROPERITONEUM: No free fluid is seen. No ascites. No free air. VASCULATURE: Atherosclerotic calcifications of the thoracic aorta are noted. Vascular calcifications are seen as well as diffuse arterial stenting of the right iliac system. ABDOMINAL AND PELVIS LYMPH NODES: No lymphadenopathy. BONES AND SOFT TISSUES: Osseous structures of the chest are within normal limits. No rib abnormality is seen. No bony abnormality is noted in the abdomen and pelvis. No focal soft tissue abnormality. IMPRESSION: 1. No acute findings in the chest, abdomen, and pelvis. 2. Small kidneys consistent with renal disease. No obstructive changes. 3. Right iliac fossa renal transplant without obstructive change. Electronically signed by: Oneil Devonshire MD 05/10/2024 08:00 PM EST RP Workstation: [id number]    CT Head Wo Contrast Result Date: 05/10/2024 EXAM: CT HEAD WITHOUT CONTRAST 05/10/2024 07:26:19 PM TECHNIQUE: CT of the head was performed without the administration of intravenous contrast. Automated exposure control, iterative reconstruction, and/or weight based adjustment of the mA/kV was utilized to reduce the radiation dose  to as low as reasonably achievable. COMPARISON: 06/28/2018 CLINICAL HISTORY: Syncope/presyncope, cerebrovascular cause suspected. FINDINGS: BRAIN AND VENTRICLES: No acute hemorrhage. No evidence of acute infarct. No hydrocephalus. No extra-axial collection. No mass effect or midline shift. ORBITS: No acute abnormality. SINUSES: No acute abnormality. SOFT TISSUES AND SKULL: No acute soft tissue abnormality. No skull fracture. IMPRESSION: 1. No acute intracranial abnormality. Electronically signed by: Franky Crease MD 05/10/2024 07:56 PM EST RP Workstation: [id number]   DG Chest Portable 1 View Result Date: 05/10/2024 CLINICAL DATA:  Clemens, syncope EXAM: PORTABLE CHEST 1 VIEW COMPARISON:  07/28/2019 FINDINGS: Single frontal view of the chest demonstrates an unremarkable cardiac silhouette. No acute airspace disease, effusion, or pneumothorax. No acute bony abnormalities. Rounded calcification overlying the left anterior sixth rib may reflect soft tissue calcification within the left breast versus underlying left lower lobe granuloma. IMPRESSION: 1. No acute intrathoracic process. Electronically Signed   By: Ozell Daring M.D.   On: 05/10/2024 15:12     EKG: My personal interpretation of EKG shows: Sinus tachycardia heart rate 159 prolonged QTc    Assessment/Plan: Principal Problem:   Recurrent syncope Active Problems:   AKI (acute kidney injury)   Hypokalemia   Hyperlipidemia   Peripheral artery disease   S/P BKA (below knee amputation) unilateral, right (HCC)   Essential hypertension   History of kidney transplant   QT prolongation   Elevated blood pressure  reading   Insulin  dependent type 2 diabetes mellitus (HCC)   Chronic orthostatic hypotension    Assessment and Plan: Recurrent syncope History of recurrent syncope and orthostatic hypotension History of chronic orthostatic hypotension -Patient presented emergency department with episode of syncope and unconsciousness with rolling.  Patient did not have any seizure like activity, bowel or bladder dysfunction.  Sister reported that patient slammed about the floor due to the syncopal episode and had some frothy pink-colored sputum.  She was unconscious probably 1 to 2 minutes.  No evidence of confusion after the syncopal episode - Patient has history of chronic recurrent syncope due to orthostatic hypotension and diabetic polyneuropathy. - At presentation to ED patient found hypertensive as she received 2 L of LR bolus afterward blood pressure has been improved. -Lab work,Lab, CBC unremarkable. CMP showed low potassium 2.8, low chloride 90, elevated blood glucose 258, elevated creatinine 2.04, elevated AST 157 and low GFR 28, normal bicarb level 25. Normal CK.  Normal mag.  Slight elevated serum Osmo to 300.  Respiratory panel negative for COVID RSV flu.  Pending FOBT. Repeat BMP showing is still low potassium 2.8, and elevated creatinine 2. Flat troponin 29 and 32. Elevated beta-hydroxybutyrate level 2.28.  Normal blood glucose level 146. -NormalCT head no acute intercranial abnormality. CT chest abdomen pelvis unremarkable. Chest x-ray unremarkable. - EKG showing sinus tachycardia heart rate 115 and prolonged QTc 554 ms. - In the ED patient received 2 L of NS bolus and IV KCl 30 mEq.  Due to prolonged QTc patient received mag sulfate in the ED. -During my evaluation supine blood pressure 170/80 and sitting blood pressure 79/50.  Positive orthostatic vitals. -Concern for syncope in the setting of chronic orthostatic hypotension. - Will obtain echocardiogram - Obtaining EKG to rule out  underlying seizure. - Continue cardiac monitoring. - Checking orthostatic vital.   Acute kidney injury -Elevated creatinine 2.04.  Elevated beta-hydroxybutyrate.  CMP no evidence of DKA.  VBG showing metabolic alkalosis.  At this time there is no concern for DKA or HHS.  Prerenal acute kidney injury in the  setting of recurrent vomiting and dehydration. - In the ED patient received 2 L of NS bolus. UA no evidence of UTI.  Checking urine creatinine and urine sodium. - Continue maintenance fluid LR 75 cc/h. -Continue monitor renal function, avoid nephrotoxic agent and renally adjust medications.   History of renal transplant -Checking tacrolimus  level. - Continue tacrolimus  2 mg twice daily and mycophenolate  540 mg twice daily.  Hypokalemia -Low potassium 2.8.  Replating with IV KCl.  Normal mag level.  Continue to monitor electrolytes and replete as needed   Prolonged QTc interva -EKG showing prolonged QTc 554 ms.  Patient is on tacrolimus  which can cause prolonged QTc interval as well.  Given renal transplant patient plan to continue tacrolimus  and continue cardiac monitoring.  Need to avoid other medications that can worsening the QTc prolongation.  Essential hypertension Elevated blood pressure -Elevated blood pressure in the setting of 2 L of NS bolus.  Patient also has history of chronic orthostatic hypotension at the same time history of HTN.   -Positive orthostatic vitals in the ED.  Supine blood pressure 170/90 and sitting blood pressure 70/59 MAP 59.  Holding Toprol -XL.    Hyperlipidemia -Continue Crestor   Peripheral artery disease s/p  right femoral stent placement -Continue Crestor  and Plavix    DVT prophylaxis:  SQ Heparin  Code Status:  Full Code Diet: Heart healthy carb modified diet Family Communication:   Family was present at bedside, at the time of interview. Opportunity was given to ask question and all questions were answered satisfactorily.  Disposition Plan:  Continue to monitor improvement of renal function and potassium level.  Need to follow-up with echocardiogram and EEG result Consults: None indicated at this time Admission status:   Inpatient, Telemetry bed  Severity of Illness: The appropriate patient status for this patient is INPATIENT. Inpatient status is judged to be reasonable and necessary in order to provide the required intensity of service to ensure the patient's safety. The patient's presenting symptoms, physical exam findings, and initial radiographic and laboratory data in the context of their chronic comorbidities is felt to place them at high risk for further clinical deterioration. Furthermore, it is not anticipated that the patient will be medically stable for discharge from the hospital within 2 midnights of admission.   * I certify that at the point of admission it is my clinical judgment that the patient will require inpatient hospital care spanning beyond 2 midnights from the point of admission due to high intensity of service, high risk for further deterioration and high frequency of surveillance required.DEWAINE    Ruslan Mccabe, MD [facility]  How to contact the [facility] Attending or Consulting provider 7A - 7P or covering provider during after hours 7P -7A, for this patient.  Check the care team in [facility] and look for a) attending/consulting [facility] provider listed and b) the [facility] team listed Log into [email or web address] and use [facility]'s universal password to access. If you do not have the password, please contact the hospital operator. Locate the [facility] provider you are looking for under [facility] and page to a number that you can be directly reached. If you still have difficulty reaching the provider, please page the [facility] (Director on Call) for the Hospitalists listed on amion for assistance.  05/10/2024, 9:01 PM

## 2024-05-10 NOTE — ED Notes (Signed)
 Pt reported feeling dizzy when she stood up. Unable to stand for the 3 minutes do to dizziness.

## 2024-05-10 NOTE — ED Notes (Signed)
 CCMD called.

## 2024-05-10 NOTE — ED Notes (Signed)
 Cm 8 results to emily m.rn at

## 2024-05-10 NOTE — ED Triage Notes (Signed)
 Pt. BIB [facility] from home with c/o syncopal episodes; Per [facility] the pt. Was on her way to the UC today, she passed out on her way to the car, and according to family, she was unresponsive for a couple of mins and also throwing up. Pt. Was A/O x4 upon their arrival; Pt. 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 Pt. States that she has been feeling this dizziness and N/V for the past week. Pt. Has a 20G in L AC.

## 2024-05-10 NOTE — ED Provider Notes (Addendum)
 [place] EMERGENCY DEPARTMENT AT [facility] Provider Note   CSN: [id number] Arrival date & time: 05/10/24  1413    Patient presents with: Loss of Consciousness  [name] is a 53 y.o. female history of renal transplant 2014 follows with [facility] here for evaluation of syncope.  Patient states she has known recurrent orthostatic hypotension however over the last week and a half she has had significant increase in her dizziness.  Since she is unable to stand without feeling like she is lightheaded.  She was going to urgent care called her sister to come and get her.  She was sitting on a chair when sister went to find patient she was slumped over, drooling and gasping for air.  When she came to after a minute or 2 had multiple episodes of vomiting with orange and pink in color.  Patient denies any melena or bright per rectum.  She has had persistent nausea over the last week and a half as well has had overall decreased p.o. intake.  She states she was recently treated at [facility] for recurrent UTI.  She denies hitting her head, anticoagulation.  No blurred vision, unilateral weakness.  She has a right BKA.  She denies any melena or bright red blood per rectum.  She has no history of syncope.    HPI     Prior to Admission medications   Medication Sig Start Date End Date Taking? Authorizing Provider  albuterol  (ACCUNEB ) 0.63 MG/3ML nebulizer solution Take 1 ampule by nebulization every 6 (six) hours as needed for wheezing or shortness of breath.  11/07/18   [provider]  albuterol  (PROVENTIL  HFA;VENTOLIN  HFA) 108 (90 BASE) MCG/ACT inhaler Inhale 1 puff into the lungs every 6 (six) hours as needed for wheezing or shortness of breath.    [provider]  amLODipine  (NORVASC ) 10 MG tablet Take 1 tablet (10 mg total) by mouth daily. Take one extra tablet by mouth daily as needed for blood pressure >140/80 12/11/23   [name], MD  cloNIDine  (CATAPRES  -  DOSED IN MG/24 HR) 0.2 mg/24hr patch APPLY 1 PATCH TOPICALLY ONCE A WEEK 10/22/23   [name], MD  clopidogrel  (PLAVIX ) 75 MG tablet Take 1 tablet (75 mg total) by mouth daily. 12/11/23   [name], MD  diphenoxylate-atropine (LOMOTIL) 2.5-0.025 MG tablet Take 1 tablet by mouth 3 (three) times daily as needed for diarrhea or loose stools.  05/31/19   [provider]  fluticasone  (FLONASE ) 50 MCG/ACT nasal spray Place 2 sprays into both nostrils daily as needed for allergies. 06/22/22   White, Shelba SAUNDERS, NP  furosemide  (LASIX ) 40 MG tablet Take 40 mg by mouth daily.  12/25/16   [provider]  insulin  detemir (LEVEMIR  FLEXPEN) 100 UNIT/ML FlexPen Inject 16-24 Units into the skin daily.    [provider]  Insulin  Pen Needle (NOVOFINE) 30G X 8 MM MISC Inject 10 each into the skin as needed. 07/21/22   Stanhope, Catharine M, FNP  methenamine (HIPREX) 1 g tablet Take 1 g by mouth 2 (two) times daily with a meal.    [provider]  metoprolol  succinate (TOPROL -XL) 50 MG 24 hr tablet TAKE ONE TABLET BY MOUTH ONCE DAILY with OR immediately following a meal. 03/17/24   [name], MD  Multiple Vitamins-Minerals (WOMENS MULTIVITAMIN PO) Take by mouth.    [provider]  mycophenolate  (MYFORTIC ) 180 MG EC tablet Take 540 mg by mouth 2 (two) times daily. (540mg )  in the morning and bedtime-( 3 capsules )    [provider]  pantoprazole  (PROTONIX ) 40 MG tablet Take 40 mg by mouth at bedtime.     [provider]  rosuvastatin  (CRESTOR ) 20 MG tablet Take 1 tablet (20 mg total) by mouth daily. 12/11/23   [name], MD  SEMGLEE , YFGN, 100 UNIT/ML Pen Inject 10 Units into the skin 2 (two) times daily. 09/04/23   [provider]  tacrolimus  (PROGRAF ) 1 MG capsule Take 2 mg by mouth 2 (two) times daily.     [provider]  tirzepatide CLOYDE) 10 MG/0.5ML Pen Inject 2.5 mg into the skin once a week.    [provider]     Allergies: Patient has no known allergies.    Review of Systems  Constitutional:  Positive for activity change, appetite change and fatigue.  HENT: Negative.    Respiratory: Negative.    Cardiovascular: Negative.   Gastrointestinal:  Positive for nausea and vomiting. Negative for abdominal distention, abdominal pain, anal bleeding, blood in stool, constipation, diarrhea and rectal pain.  Genitourinary:  Positive for decreased urine volume and difficulty urinating. Negative for dysuria, flank pain and frequency.  Musculoskeletal: Negative.   Skin: Negative.   Neurological:  Positive for dizziness, syncope, weakness and light-headedness. Negative for tremors, seizures, facial asymmetry, speech difficulty, numbness and headaches.  All other systems reviewed and are negative.  Updated Vital Signs BP (!) 188/82   Pulse (!) 104   Temp 97.6 F (36.4 C) (Oral)   Resp 17   Ht 5' (1.524 m)   Wt 66.7 kg   LMP 07/02/2022 (Approximate)   SpO2 99%   BMI 28.71 kg/m   Physical Exam Vitals and nursing note reviewed.  Constitutional:      General: She is not in acute distress.    Appearance: She is well-developed. She is not ill-appearing, toxic-appearing or diaphoretic.  HENT:     Head: Normocephalic and atraumatic.     Nose: Nose normal.     Mouth/Throat:     Mouth: Mucous membranes are dry.  Eyes:     Pupils: Pupils are equal, round, and reactive to light.  Cardiovascular:     Rate and Rhythm: Tachycardia present.     Pulses:          Radial pulses are 2+ on the right side and 2+ on the left side.       Dorsalis pedis pulses are 1+ on the left side.     Heart sounds: Normal heart sounds.  Pulmonary:     Effort: Pulmonary effort is normal. No respiratory distress.     Breath sounds: Normal breath sounds.  Abdominal:     General: Bowel sounds are normal. There is no distension.     Palpations: Abdomen is soft.     Tenderness: There is no abdominal tenderness. There is no right  CVA tenderness, left CVA tenderness, guarding or rebound.  Musculoskeletal:        General: Normal range of motion.     Cervical back: Normal range of motion.     Comments: Nontender bilateral upper and lower extremities.  She has a right BKA.     Right Lower Extremity: Right leg is amputated below knee.  Skin:    General: Skin is warm and dry.     Capillary Refill: Capillary refill takes less than 2 seconds.  Neurological:     General: No focal deficit present.     Mental Status: She  is alert.     Cranial Nerves: No cranial nerve deficit.     Sensory: No sensory deficit.     Motor: Weakness present.     Gait: Gait abnormal (amputation).     Comments: 4/5 strength bilateral upper extremities  Psychiatric:        Mood and Affect: Mood normal.     (all labs ordered are listed, but only abnormal results are displayed) Labs Reviewed  CBC WITH DIFFERENTIAL/PLATELET - Abnormal; Notable for the following components:      Result Value   RBC 5.54 (*)    MCV 78.3 (*)    MCH 24.4 (*)    All other components within normal limits  COMPREHENSIVE METABOLIC PANEL WITH GFR - Abnormal; Notable for the following components:   Potassium 2.8 (*)    Chloride 90 (*)    Glucose, Bld 258 (*)    Creatinine, Ser 2.04 (*)    Albumin  3.4 (*)    Alkaline Phosphatase 157 (*)    GFR, Estimated 28 (*)    Anion gap 23 (*)    All other components within normal limits  URINALYSIS, W/ REFLEX TO CULTURE (INFECTION SUSPECTED) - Abnormal; Notable for the following components:   APPearance HAZY (*)    Hgb urine dipstick SMALL (*)    Protein, ur 100 (*)    Bacteria, UA RARE (*)    All other components within normal limits  BETA-HYDROXYBUTYRIC ACID - Abnormal; Notable for the following components:   Beta-Hydroxybutyric Acid 2.26 (*)    All other components within normal limits  OSMOLALITY - Abnormal; Notable for the following components:   Osmolality 300 (*)    All other components within normal limits   I-STAT CHEM 8, ED - Abnormal; Notable for the following components:   Potassium 2.6 (*)    Chloride 95 (*)    BUN 22 (*)    Creatinine, Ser 2.00 (*)    Glucose, Bld 263 (*)    Calcium , Ion 0.95 (*)    Hemoglobin 15.6 (*)    All other components within normal limits  I-STAT VENOUS BLOOD GAS, ED - Abnormal; Notable for the following components:   pH, Ven 7.503 (*)    pCO2, Ven 37.2 (*)    pO2, Ven 186 (*)    Bicarbonate 29.2 (*)    Acid-Base Excess 6.0 (*)    Sodium 134 (*)    Potassium 2.7 (*)    Calcium , Ion 0.94 (*)    Hemoglobin 15.3 (*)    All other components within normal limits  CBG MONITORING, ED - Abnormal; Notable for the following components:   Glucose-Capillary 146 (*)    All other components within normal limits  TROPONIN I (HIGH SENSITIVITY) - Abnormal; Notable for the following components:   Troponin I (High Sensitivity) 29 (*)    All other components within normal limits  TROPONIN I (HIGH SENSITIVITY) - Abnormal; Notable for the following components:   Troponin I (High Sensitivity) 32 (*)    All other components within normal limits  RESP PANEL BY RT-PCR (RSV, FLU A&B, COVID)  RVPGX2  URINE CULTURE  CK  MAGNESIUM   BASIC METABOLIC PANEL WITH GFR  SODIUM, URINE, RANDOM  CREATININE, URINE, RANDOM  HIV ANTIBODY (ROUTINE TESTING W REFLEX)  HEMOGLOBIN A1C  COMPREHENSIVE METABOLIC PANEL WITH GFR  CBC  TACROLIMUS  LEVEL  POC OCCULT BLOOD, ED    EKG: EKG Interpretation Date/Time:  Monday May 10 2024 20:40:04 EST Ventricular Rate:  105 PR Interval:  121 QRS Duration:  83 QT Interval:  373 QTC Calculation: 493 R Axis:   42  Text Interpretation: Sinus tachycardia Probable left atrial enlargement Probable left ventricular hypertrophy Borderline prolonged QT interval Confirmed by Ula Barter [phone]) on 05/10/2024 9:44:07 PM  Radiology: CT CHEST ABDOMEN PELVIS WO CONTRAST Result Date: 05/10/2024 EXAM: CT CHEST, ABDOMEN AND PELVIS WITHOUT CONTRAST  05/10/2024 07:26:19 PM TECHNIQUE: CT of the chest, abdomen and pelvis was performed without the administration of intravenous contrast. Multiplanar reformatted images are provided for review. Automated exposure control, iterative reconstruction, and/or weight based adjustment of the mA/kV was utilized to reduce the radiation dose to as low as reasonably achievable. COMPARISON: 04/02/2015 CLINICAL HISTORY: Chest and abdominal pain. FINDINGS: LIMITATIONS: Somewhat limited due to lack of IV contrast. CHEST: MEDIASTINUM AND LYMPH NODES: Heart is at the upper limits of normal in size. Minimal pericardial effusion is seen. Mild coronary calcifications are seen. The central airways are clear. No mediastinal, hilar or axillary lymphadenopathy. The esophagus, as visualized, is within normal limits. Thoracic inlet is within normal limits. LUNGS AND PLEURA: The lungs are well aerated bilaterally. No focal infiltrate or pulmonary edema. No pleural effusion or pneumothorax. No sizable parenchymal nodule is seen. ABDOMEN AND PELVIS: LIVER: The liver is within normal limits. GALLBLADDER AND BILE DUCTS: The gallbladder has been surgically removed. No biliary ductal dilatation. SPLEEN: The spleen is unremarkable. PANCREAS: The pancreas is unremarkable. ADRENAL GLANDS: The adrenal glands are within normal limits. KIDNEYS, URETERS AND BLADDER: The native kidneys are small. No obstructive changes are seen in the native kidneys. A right iliac fossa renal transplant is seen without obstructive change. No stones in the kidneys or ureters. The bladder is unremarkable. GI AND BOWEL: Stomach and small bowel are within normal limits. Scattered fecal material is noted throughout the colon. No obstructive or inflammatory changes are noted in the colon. The appendix is within normal limits. REPRODUCTIVE ORGANS: The uterus is within normal limits. PERITONEUM AND RETROPERITONEUM: No free fluid is seen. No ascites. No free air. VASCULATURE:  Atherosclerotic calcifications of the thoracic aorta are noted. Vascular calcifications are seen as well as diffuse arterial stenting of the right iliac system. ABDOMINAL AND PELVIS LYMPH NODES: No lymphadenopathy. BONES AND SOFT TISSUES: Osseous structures of the chest are within normal limits. No rib abnormality is seen. No bony abnormality is noted in the abdomen and pelvis. No focal soft tissue abnormality. IMPRESSION: 1. No acute findings in the chest, abdomen, and pelvis. 2. Small kidneys consistent with renal disease. No obstructive changes. 3. Right iliac fossa renal transplant without obstructive change. Electronically signed by: Oneil Devonshire MD 05/10/2024 08:00 PM EST RP Workstation: [id number]   CT Head Wo Contrast Result Date: 05/10/2024 EXAM: CT HEAD WITHOUT CONTRAST 05/10/2024 07:26:19 PM TECHNIQUE: CT of the head was performed without the administration of intravenous contrast. Automated exposure control, iterative reconstruction, and/or weight based adjustment of the mA/kV was utilized to reduce the radiation dose to as low as reasonably achievable. COMPARISON: 06/28/2018 CLINICAL HISTORY: Syncope/presyncope, cerebrovascular cause suspected. FINDINGS: BRAIN AND VENTRICLES: No acute hemorrhage. No evidence of acute infarct. No hydrocephalus. No extra-axial collection. No mass effect or midline shift. ORBITS: No acute abnormality. SINUSES: No acute abnormality. SOFT TISSUES AND SKULL: No acute soft tissue abnormality. No skull fracture. IMPRESSION: 1. No acute intracranial abnormality. Electronically signed by: Franky Crease MD 05/10/2024 07:56 PM EST RP Workstation: [id number]   DG Chest Portable 1 View Result Date: 05/10/2024 CLINICAL DATA:  Clemens, syncope EXAM: PORTABLE CHEST 1 VIEW  COMPARISON:  07/28/2019 FINDINGS: Single frontal view of the chest demonstrates an unremarkable cardiac silhouette. No acute airspace disease, effusion, or pneumothorax. No acute bony abnormalities. Rounded  calcification overlying the left anterior sixth rib may reflect soft tissue calcification within the left breast versus underlying left lower lobe granuloma. IMPRESSION: 1. No acute intrathoracic process. Electronically Signed   By: Ozell Daring M.D.   On: 05/10/2024 15:12     .Critical Care  Performed by: Edie Rosebud LABOR, PA-C Authorized by: Edie Rosebud LABOR, PA-C   Critical care provider statement:    Critical care time (minutes):  35   Critical care was necessary to treat or prevent imminent or life-threatening deterioration of the following conditions:  Circulatory failure, dehydration and metabolic crisis   Critical care was time spent personally by me on the following activities:  Development of treatment plan with patient or surrogate, discussions with consultants, evaluation of patient's response to treatment, examination of patient, ordering and review of laboratory studies, ordering and review of radiographic studies, ordering and performing treatments and interventions, pulse oximetry, re-evaluation of patient's condition and review of old charts    Medications Ordered in the ED  lactated ringers  infusion (has no administration in time range)  rosuvastatin  (CRESTOR ) tablet 20 mg (has no administration in time range)  pantoprazole  (PROTONIX ) EC tablet 40 mg (has no administration in time range)  clopidogrel  (PLAVIX ) tablet 75 mg (has no administration in time range)  mycophenolate  (MYFORTIC ) EC tablet 540 mg (has no administration in time range)  tacrolimus  (PROGRAF ) capsule 2 mg (has no administration in time range)  heparin  injection 5,000 Units (has no administration in time range)  sodium chloride  flush (NS) 0.9 % injection 3 mL (has no administration in time range)  sodium chloride  flush (NS) 0.9 % injection 3 mL (has no administration in time range)  sodium chloride  flush (NS) 0.9 % injection 3 mL (has no administration in time range)  0.9 %  sodium chloride  infusion  (has no administration in time range)  acetaminophen  (TYLENOL ) tablet 650 mg (has no administration in time range)    Or  acetaminophen  (TYLENOL ) suppository 650 mg (has no administration in time range)  trimethobenzamide  (TIGAN ) injection 200 mg (has no administration in time range)  docusate sodium  (COLACE) capsule 100 mg (has no administration in time range)  insulin  aspart (novoLOG ) injection 0-6 Units (has no administration in time range)  insulin  aspart (novoLOG ) injection 0-5 Units (has no administration in time range)  potassium chloride  10 mEq in 100 mL IVPB (has no administration in time range)  insulin  detemir (LEVEMIR ) FlexPen 16 Units (has no administration in time range)  hydrALAZINE  (APRESOLINE ) injection 5 mg (has no administration in time range)  sodium chloride  0.9 % bolus 500 mL (0 mLs Intravenous Stopped 05/10/24 1649)  magnesium  sulfate IVPB 2 g 50 mL (0 g Intravenous Stopped 05/10/24 1647)  potassium chloride  10 mEq in 100 mL IVPB (0 mEq Intravenous Stopped 05/10/24 2127)  sodium chloride  0.9 % bolus 1,000 mL (0 mLs Intravenous Stopped 05/10/24 7185)    53 year old with complex medical history here for evaluation of syncope.  Sounds like has history of orthostatic hypotension and vertigo however today felt different had a syncopal episode while sitting in a chair without any movement.  She denies any prodromal symptoms.  According to family she was slumped over, gasping for air and drooling.  No seizure-like activity, bowel or bladder continence, tongue injury.  She has never had anything like this previously.  Sound like she has had decreased p.o. intake over the last week and a half.  Was treated for UTI at [facility].  Here she does appear clinically dry.  Will plan on labs, imaging, reassess  Labs and imaging personally viewed interpreted:  EKG without ischemic changes heart does show long QT with QTc 544--give IV magnesium  Chest x-ray without cardiomegaly, pulm  edema, pneumothorax CBC without leukocytosis, hemoglobin 13.5 I-STAT Chem-8 shows potassium 2.6, creatinine 2, glucose elevated to 63 CMP potassium 2.8, glucose 258 CK 67 Mag 1.8 UA wbc 11-20 sent for culture Trop 29-32 no prior to compare  Patient reassessed.  Feels okay.  No altered mental status.  We discussed her labs, imaging.  She has an AKI which I suspect is likely due to dehydration given her nausea, vomiting and decreased p.o. intake.  She is on a GLP-1 however states she has been on this for many years without difficulty.  Urine does have 11-20 WBC however nitrate, leuk negative with only rare bacteria.   Unclear etiology of her syncope. Patient to be admitted for syncope, AKI, electrolyte abnormality, dehydration  Considered DKA, HHS however VBG with normal pH, normal bicarb, no ketonuria no altered mental status low suspicion.  Suspect likely dehydration from decreased p.o. intake.  Considered PE, dissection, arrhythmia, sepsis however low suspicion at this time.  Discussed with Dr. Sendil with medicine who is agreeable to evaluate patient for admission.    The patient appears reasonably stabilized for admission considering the current resources, flow, and capabilities available in the ED at this time, and I doubt any other [facility] requiring further screening and/or treatment in the ED prior to admission.                                  Medical Decision Making Amount and/or Complexity of Data Reviewed Independent Historian: caregiver External Data Reviewed: labs, radiology, ECG and notes. Labs: ordered. Decision-making details documented in ED Course. Radiology: ordered and independent interpretation performed. Decision-making details documented in ED Course. ECG/medicine tests: ordered and independent interpretation performed. Decision-making details documented in ED Course.  Risk OTC drugs. Prescription drug management. Decision regarding hospitalization. Diagnosis or  treatment significantly limited by social determinants of health.        Final diagnoses:  Syncope, unspecified syncope type  Prolonged Q-T interval on ECG  Hypokalemia  Renal transplant recipient  Dehydration    ED Discharge Orders     None        Ladell Lea A, PA-C 05/10/24 2213    Ula Prentice SAUNDERS, MD 05/10/24 [id number]

## 2024-05-11 ENCOUNTER — Inpatient Hospital Stay (HOSPITAL_COMMUNITY)

## 2024-05-11 DIAGNOSIS — R030 Elevated blood-pressure reading, without diagnosis of hypertension: Secondary | ICD-10-CM

## 2024-05-11 DIAGNOSIS — R55 Syncope and collapse: Secondary | ICD-10-CM

## 2024-05-11 LAB — URINE CULTURE

## 2024-05-11 LAB — COMPREHENSIVE METABOLIC PANEL WITH GFR
ALT: 21 U/L (ref 0–44)
AST: 22 U/L (ref 15–41)
Albumin: 2.6 g/dL — ABNORMAL LOW (ref 3.5–5.0)
Alkaline Phosphatase: 109 U/L (ref 38–126)
Anion gap: 16 — ABNORMAL HIGH (ref 5–15)
BUN: 15 mg/dL (ref 6–20)
CO2: 22 mmol/L (ref 22–32)
Calcium: 8.6 mg/dL — ABNORMAL LOW (ref 8.9–10.3)
Chloride: 101 mmol/L (ref 98–111)
Creatinine, Ser: 1.57 mg/dL — ABNORMAL HIGH (ref 0.44–1.00)
GFR, Estimated: 39 mL/min — ABNORMAL LOW (ref >60)
Glucose, Bld: 80 mg/dL (ref 70–99)
Potassium: 3.5 mmol/L (ref 3.5–5.1)
Sodium: 139 mmol/L (ref 135–145)
Total Bilirubin: 1.1 mg/dL (ref 0.0–1.2)
Total Protein: 5.8 g/dL — ABNORMAL LOW (ref 6.5–8.1)

## 2024-05-11 LAB — CBC
HCT: 36.2 % (ref 36.0–46.0)
Hemoglobin: 11.3 g/dL — ABNORMAL LOW (ref 12.0–15.0)
MCH: 24.2 pg — ABNORMAL LOW (ref 26.0–34.0)
MCHC: 31.2 g/dL (ref 30.0–36.0)
MCV: 77.7 fL — ABNORMAL LOW (ref 80.0–100.0)
Platelets: 237 K/uL (ref 150–400)
RBC: 4.66 MIL/uL (ref 3.87–5.11)
RDW: 13.6 % (ref 11.5–15.5)
WBC: 7.3 K/uL (ref 4.0–10.5)
nRBC: 0 % (ref 0.0–0.2)

## 2024-05-11 LAB — ECHOCARDIOGRAM COMPLETE
Area-P 1/2: 4.41 cm2
Height: 60 in
S' Lateral: 1.6 cm
Weight: 2352 [oz_av]

## 2024-05-11 LAB — SODIUM, URINE, RANDOM: Sodium, Ur: 96 mmol/L

## 2024-05-11 LAB — GLUCOSE, CAPILLARY
Glucose-Capillary: 159 mg/dL — ABNORMAL HIGH (ref 70–99)
Glucose-Capillary: 195 mg/dL — ABNORMAL HIGH (ref 70–99)

## 2024-05-11 LAB — CBG MONITORING, ED
Glucose-Capillary: 75 mg/dL (ref 70–99)
Glucose-Capillary: 121 mg/dL — ABNORMAL HIGH (ref 70–99)

## 2024-05-11 LAB — HEMOGLOBIN A1C
Hgb A1c MFr Bld: 8.3 % — ABNORMAL HIGH (ref 4.8–5.6)
Mean Plasma Glucose: 192 mg/dL

## 2024-05-11 LAB — CREATININE, URINE, RANDOM: Creatinine, Urine: 52 mg/dL

## 2024-05-11 LAB — HIV ANTIBODY (ROUTINE TESTING W REFLEX): HIV Screen 4th Generation wRfx: NONREACTIVE

## 2024-05-11 MED ORDER — MYCOPHENOLATE SODIUM 180 MG PO TBEC
360.0000 mg | DELAYED_RELEASE_TABLET | Freq: Two times a day (BID) | ORAL | Status: DC
  Administered 2024-05-11 – 2024-05-13 (×4): 360 mg via ORAL
  Filled 2024-05-11 (×5): qty 2

## 2024-05-11 MED ORDER — PERFLUTREN LIPID MICROSPHERE
1.0000 mL | INTRAVENOUS | Status: AC | PRN
  Administered 2024-05-11: 2 mL via INTRAVENOUS

## 2024-05-11 MED ORDER — METOPROLOL SUCCINATE ER 25 MG PO TB24
25.0000 mg | ORAL_TABLET | Freq: Every day | ORAL | Status: DC
  Administered 2024-05-11 – 2024-05-13 (×3): 25 mg via ORAL
  Filled 2024-05-11 (×3): qty 1

## 2024-05-11 MED ORDER — HYDRALAZINE HCL 20 MG/ML IJ SOLN
5.0000 mg | Freq: Once | INTRAMUSCULAR | Status: AC
  Administered 2024-05-11: 5 mg via INTRAVENOUS
  Filled 2024-05-11: qty 1

## 2024-05-11 MED ORDER — HYDRALAZINE HCL 25 MG PO TABS: 25.0000 mg | ORAL_TABLET | Freq: Four times a day (QID) | ORAL | Status: DC | PRN

## 2024-05-11 MED ORDER — AMLODIPINE BESYLATE 5 MG PO TABS
5.0000 mg | ORAL_TABLET | Freq: Every day | ORAL | Status: DC
  Administered 2024-05-11 – 2024-05-13 (×3): 5 mg via ORAL
  Filled 2024-05-11 (×3): qty 1

## 2024-05-11 MED ORDER — CLONIDINE HCL 0.1 MG PO TABS
0.1000 mg | ORAL_TABLET | Freq: Three times a day (TID) | ORAL | Status: DC | PRN
  Filled 2024-05-11: qty 1

## 2024-05-11 MED ORDER — POTASSIUM CHLORIDE 20 MEQ PO PACK: 20.0000 meq | PACK | Freq: Once | ORAL | Status: DC

## 2024-05-11 NOTE — ED Notes (Signed)
 EEG and Dr. Drusilla at bedside

## 2024-05-11 NOTE — Progress Notes (Signed)
 EEG complete - results pending

## 2024-05-11 NOTE — Progress Notes (Addendum)
 Patient's mother was concerned that she was having these episodes for no clear reason.  Discussed in detail, patient has been taking antihypertensive medications including Catapres  patch 0.2 mg q. 7 days, metoprolol  50 mg daily, Lasix  40 mg daily, and amlodipine  10 mg daily for quite some time.  She has lost 40 pounds of weight on Mounjaro for past 1 year.  She is having these episodes of recurrent syncope due to severe orthostatic hypotension.  Dosing of blood pressure medications will need adjustment Catapres  0.2 mg q. 7-day patch has been discontinued.  Started on Catapres  0.1 p.o. 3 times daily as needed to prevent rebound hypertension and taper off slowly Cut down amlodipine  to 5 mg daily Will restart metoprolol  succinate at a lower dose of 25 mg daily  Critical care time spent 35 minutes

## 2024-05-11 NOTE — Progress Notes (Signed)
 [facility]  PROGRESS NOTE  [name] FMW:[id number] DOB: [birth year] DOA: 05/10/2024 PCP: Joy Bare, MD   Brief HPI:    53 y.o. female with medical history significant of history of recurrent syncope, ESRD secondary to essential hypertension diabetes previously on dialysis status post SCD DDKT right pelvis 01/2023, essential hypertension, chronic orthostatic hypotension, history of recurrent UTI/sinusitis/URI, insulin -dependent DM type Il, hyperlipidemia, orthostatic hypotension due to diabetic neuropathy, diabetic retinopathy, severe PAD with right common Ilac artery stent 2017 and restenting again 2021, right BKA 2007 status post prosthesis, left SFA angioplasty February 2019 presented to emergency department via EMS for evaluation for syncope.  Patient reported that she has recurrent orthostatic hypotension over the course of last 1 week with significant increase of dizziness.     Assessment/Plan:   Recurrent syncope History of recurrent syncope and orthostatic hypotension History of chronic orthostatic hypotension -Patient presented emergency department with episode of syncope and unconsciousness with rolling.  Patient did not have any seizure like activity, bowel or bladder dysfunction.  Sister reported that patient slammed about the floor due to the syncopal episode and had some frothy pink-colored sputum.  She was unconscious probably 1 to 2 minutes.  No evidence of confusion after the syncopal episode. -Normal CT head no acute intercranial abnormality. CT chest abdomen pelvis unremarkable. Chest x-ray unremarkable. - EKG showing sinus tachycardia heart rate 115 and prolonged QTc 554 ms. - In the ED patient received 2 L of NS bolus and IV KCl 30 mEq.  Due to prolonged QTc patient received mag sulfate  - supine blood pressure 170/80 and sitting blood pressure 79/50.  Positive orthostatic vitals. -Concern for syncope in the setting of chronic orthostatic hypotension. -  Echocardiogram showed EF of 70 to 75%, LVH - EEG did not show epileptiform discharges     Acute kidney injury -Elevated creatinine 2.04.  Elevated beta-hydroxybutyrate.  CMP no evidence of DKA.  VBG showing metabolic alkalosis.  At this time there is no concern for DKA or HHS.  Prerenal acute kidney injury in the setting of recurrent vomiting and dehydration. -Creatinine has improved to 1.57      History of renal transplant -Checking tacrolimus  level. - Continue tacrolimus  2 mg twice daily and mycophenolate  540 mg twice daily.   Hypokalemia -Replete     Prolonged QTc interva -EKG showing prolonged QTc 554 ms.  Patient is on tacrolimus  which can cause prolonged QTc interval as well.  Given renal transplant patient plan to continue tacrolimus  and continue cardiac monitoring.  Need to avoid other medications that can worsening the QTc prolongation.   Essential hypertension Blood pressure has been elevated -will start amlodipine  at low-dose of 5 mg daily -Hold Catapres  due to severe orthostatic hypotension  Hyperlipidemia -Continue Crestor    Peripheral artery disease s/p  right femoral stent placement -Continue Crestor  and Plavix         DVT prophylaxis: Heparin   Medications     docusate sodium   100 mg Oral BID   heparin   5,000 Units Subcutaneous Q8H   insulin  aspart  0-5 Units Subcutaneous QHS   insulin  aspart  0-6 Units Subcutaneous TID WC   insulin  glargine-yfgn  16 Units Subcutaneous Daily   mycophenolate   540 mg Oral BID   pantoprazole   40 mg Oral QHS   rosuvastatin   20 mg Oral Daily   sodium chloride  flush  3 mL Intravenous Q12H   sodium chloride  flush  3 mL Intravenous Q12H   tacrolimus   2 mg Oral BID  Data Reviewed:   CBG:  Recent Labs  Lab 05/10/24 1848 05/10/24 2152 05/11/24 0848  GLUCAP 146* 142* 75    SpO2: 97 %    Vitals:   05/11/24 0420 05/11/24 0515 05/11/24 0630 05/11/24 0745  BP:  (!) 180/68 (!) 149/65 (!) 166/65  Pulse:  100 98  100  Resp:  19 19 19   Temp: 98.4 F (36.9 C)     TempSrc: Oral     SpO2:  98% 95% 97%  Weight:      Height:          Data Reviewed:  Basic Metabolic Panel: Recent Labs  Lab 05/10/24 1435 05/10/24 1502 05/10/24 1706 05/10/24 2237 05/11/24 0529  NA 138 135 134* 142 139  K 2.8* 2.6* 2.7* 3.3* 3.5  CL 90* 95*  --  98 101  CO2 25  --   --  24 22  GLUCOSE 258* 263*  --  142* 80  BUN 20 22*  --  15 15  CREATININE 2.04* 2.00*  --  1.63* 1.57*  CALCIUM  9.4  --   --  8.7* 8.6*  MG 1.8  --   --   --   --     CBC: Recent Labs  Lab 05/10/24 1435 05/10/24 1502 05/10/24 1706 05/11/24 0529  WBC 7.2  --   --  7.3  NEUTROABS 5.1  --   --   --   HGB 13.5 15.6* 15.3* 11.3*  HCT 43.4 46.0 45.0 36.2  MCV 78.3*  --   --  77.7*  PLT 251  --   --  237    LFT Recent Labs  Lab 05/10/24 1435 05/11/24 0529  AST 37 22  ALT 33 21  ALKPHOS 157* 109  BILITOT 0.9 1.1  PROT 7.6 5.8*  ALBUMIN  3.4* 2.6*     Antibiotics: Anti-infectives (From admission, onward)    None        CONSULTS   Code Status: Full code  Family Communication:      Subjective   Denies any complaints   Objective    Physical Examination:   General-appears in no acute distress Heart-S1-S2, regular, no murmur auscultated Lungs-clear to auscultation bilaterally, no wheezing or crackles auscultated Abdomen-soft, nontender, no organomegaly Extremities-no edema in the lower extremities Neuro-alert, oriented x3, no focal deficit noted           [name] S [name]   [facility] If 7PM-7AM, please contact night-coverage at [email or web address], Office  [phone]   05/11/2024, 8:58 AM  LOS: 1 day

## 2024-05-11 NOTE — Procedures (Signed)
 Patient Name: [name]  MRN: [id number]  Epilepsy Attending: Pastor Falling  Referring Physician/Provider: No ref. provider found      Date: 05/11/2024 Duration: 22 minutes   Patient history: 53 year old woman with syncope vs. Seizure   Level of alertness: Awake, drowsy, sleep   AEDs during EEG study: None   Technical aspects: This EEG study was done with scalp electrodes positioned according to the 10-20 International system of electrode placement. Electrical activity was reviewed with band pass filter of 1-70Hz , sensitivity of 7 uV/mm, display speed of 25mm/sec with a 60Hz  notched filter applied as appropriate. EEG data were recorded continuously and digitally stored.  Video monitoring was available and reviewed as appropriate.  Description: The posterior dominant rhythm consists of 10 Hz activity of moderate voltage (25-35 uV) seen predominantly in posterior head regions, symmetric and reactive to eye opening and eye closing. Drowsiness was characterized by attenuation of the posterior background rhythm. Sleep was characterized by vertex waves, sleep spindles (12 to 14 Hz), maximal frontocentral region.  Hyperventilation and photic stimulation were not performed.     ABNORMALITY -None    IMPRESSION: This study is within normal limits. No seizures or epileptiform discharges were seen throughout the recording. A normal interictal EEG does not exclude nor support the diagnosis of epilepsy.   Pastor Falling MD Neurology

## 2024-05-11 NOTE — ED Notes (Signed)
 CCMD notified. Pt is on monitor.

## 2024-05-11 NOTE — Progress Notes (Signed)
 MEWS Progress Note  Patient Details Name: [name] MRN: [id number] DOB: 02-17-1971 Today's Date: 05/11/2024   MEWS Flowsheet Documentation:  Assess: MEWS Score Temp: 98.6 F (37 C) BP: (!) 230/94 MAP (mmHg): 134 Pulse Rate: (!) 108 ECG Heart Rate: (!) 109 Resp: 17 Level of Consciousness: Alert SpO2: 95 % O2 Device: Room Air Assess: MEWS Score MEWS Temp: 0 MEWS Systolic: 2 MEWS Pulse: 1 MEWS RR: 0 MEWS LOC: 0 MEWS Score: 3 MEWS Score Color: Yellow Assess: SIRS CRITERIA SIRS Temperature : 0 SIRS Respirations : 0 SIRS Pulse: 1 SIRS WBC: 0 SIRS Score Sum : 1 SIRS Temperature : 0 SIRS Pulse: 1 SIRS Respirations : 0 SIRS WBC: 0 SIRS Score Sum : 1 Assess: if the MEWS score is Yellow or Red Were vital signs accurate and taken at a resting state?: Yes Does the patient meet 2 or more of the SIRS criteria?: No MEWS guidelines implemented : Yes, yellow Treat MEWS Interventions: Considered administering scheduled or prn medications/treatments as ordered Take Vital Signs Increase Vital Sign Frequency : Yellow: Q2hr x1, continue Q4hrs until patient remains green for 12hrs Escalate MEWS: Escalate: Yellow: Discuss with charge nurse and consider notifying provider and/or RRT Notify: Charge Nurse/RN Name of Charge Nurse/RN Notified: Deanna I RN Provider Notification Provider Name/Title: Dr. Samara Attending Date Provider Notified: 05/11/24 Time Provider Notified: 1500 Method of Notification: Virtual Face-to-face Notification Reason: Other (Comment) (Orthostatic vitals) Provider response: Evaluate remotely, See new orders Date of Provider Response: 05/11/24 Time of Provider Response: 1502  Niel Porter ORN 05/11/2024, 3:02 PM

## 2024-05-11 NOTE — Progress Notes (Signed)
 Patient with leaking left AC PIV, removed.  Assessed left arm, as right arm restricted due to dialysis access.  No sites observed.     05/11/24 1611  Unsuccessful Nursing Procedure/Treatment  Type of Nursing Procedure/Treatment Peripheral IV insertion  Number of attempts 0  Location of attempt assessed left arm, no sites observed.

## 2024-05-11 NOTE — Progress Notes (Addendum)
 Patient has significant orthostatic hypotension, blood pressure significantly elevated due to rebound hypertension from stopping Catapres .  Will slowly wean off Catapres  over next few days.  Start clonidine  0.1 mg p.o. 3 times daily as needed for SBP greater than 180

## 2024-05-12 DIAGNOSIS — E1169 Type 2 diabetes mellitus with other specified complication: Secondary | ICD-10-CM | POA: Diagnosis not present

## 2024-05-12 DIAGNOSIS — E785 Hyperlipidemia, unspecified: Secondary | ICD-10-CM

## 2024-05-12 DIAGNOSIS — R55 Syncope and collapse: Secondary | ICD-10-CM

## 2024-05-12 DIAGNOSIS — I10 Essential (primary) hypertension: Secondary | ICD-10-CM | POA: Diagnosis not present

## 2024-05-12 DIAGNOSIS — N179 Acute kidney failure, unspecified: Secondary | ICD-10-CM | POA: Diagnosis not present

## 2024-05-12 LAB — GLUCOSE, CAPILLARY
Glucose-Capillary: 158 mg/dL — ABNORMAL HIGH (ref 70–99)
Glucose-Capillary: 182 mg/dL — ABNORMAL HIGH (ref 70–99)
Glucose-Capillary: 227 mg/dL — ABNORMAL HIGH (ref 70–99)
Glucose-Capillary: 225 mg/dL — ABNORMAL HIGH (ref 70–99)

## 2024-05-12 LAB — COMPREHENSIVE METABOLIC PANEL WITH GFR
ALT: 17 U/L (ref 0–44)
AST: 18 U/L (ref 15–41)
Albumin: 2.7 g/dL — ABNORMAL LOW (ref 3.5–5.0)
Alkaline Phosphatase: 98 U/L (ref 38–126)
Anion gap: 13 (ref 5–15)
BUN: 17 mg/dL (ref 6–20)
CO2: 25 mmol/L (ref 22–32)
Calcium: 8.8 mg/dL — ABNORMAL LOW (ref 8.9–10.3)
Chloride: 98 mmol/L (ref 98–111)
Creatinine, Ser: 1.34 mg/dL — ABNORMAL HIGH (ref 0.44–1.00)
GFR, Estimated: 47 mL/min — ABNORMAL LOW (ref >60)
Glucose, Bld: 148 mg/dL — ABNORMAL HIGH (ref 70–99)
Potassium: 2.9 mmol/L — ABNORMAL LOW (ref 3.5–5.1)
Sodium: 136 mmol/L (ref 135–145)
Total Bilirubin: 0.5 mg/dL (ref 0.0–1.2)
Total Protein: 5.6 g/dL — ABNORMAL LOW (ref 6.5–8.1)

## 2024-05-12 LAB — BASIC METABOLIC PANEL WITH GFR
Anion gap: 12 (ref 5–15)
BUN: 13 mg/dL (ref 6–20)
CO2: 27 mmol/L (ref 22–32)
Calcium: 8.9 mg/dL (ref 8.9–10.3)
Chloride: 98 mmol/L (ref 98–111)
Creatinine, Ser: 1.4 mg/dL — ABNORMAL HIGH (ref 0.44–1.00)
GFR, Estimated: 45 mL/min — ABNORMAL LOW
Glucose, Bld: 187 mg/dL — ABNORMAL HIGH (ref 70–99)
Potassium: 3.1 mmol/L — ABNORMAL LOW (ref 3.5–5.1)
Sodium: 137 mmol/L (ref 135–145)

## 2024-05-12 LAB — TACROLIMUS LEVEL: Tacrolimus (FK506) - LabCorp: 6.5 ng/mL (ref 5.0–20.0)

## 2024-05-12 LAB — MAGNESIUM: Magnesium: 1.5 mg/dL — ABNORMAL LOW (ref 1.7–2.4)

## 2024-05-12 MED ORDER — POTASSIUM CHLORIDE CRYS ER 20 MEQ PO TBCR
40.0000 meq | EXTENDED_RELEASE_TABLET | ORAL | Status: AC
  Administered 2024-05-12: 40 meq via ORAL
  Filled 2024-05-12: qty 2

## 2024-05-12 MED ORDER — MAGNESIUM SULFATE 2 GM/50ML IV SOLN
2.0000 g | Freq: Once | INTRAVENOUS | Status: AC
  Administered 2024-05-12: 2 g via INTRAVENOUS
  Filled 2024-05-12: qty 50

## 2024-05-12 MED ORDER — POTASSIUM CHLORIDE 10 MEQ/100ML IV SOLN
10.0000 meq | INTRAVENOUS | Status: AC
  Administered 2024-05-12 – 2024-05-13 (×2): 10 meq via INTRAVENOUS
  Filled 2024-05-12 (×2): qty 100

## 2024-05-12 MED ORDER — ENOXAPARIN SODIUM 40 MG/0.4ML IJ SOSY
40.0000 mg | PREFILLED_SYRINGE | INTRAMUSCULAR | Status: DC
  Administered 2024-05-12: 40 mg via SUBCUTANEOUS
  Filled 2024-05-12: qty 0.4

## 2024-05-12 NOTE — Progress Notes (Signed)
  Progress Note   Patient: [name] FMW:[id number] DOB: 1971-05-17 DOA: 05/10/2024     2 DOS: the patient was seen and examined on 05/12/2024   Brief hospital course: [name] was admitted to the hospital with the working diagnosis of syncope.   53 y.o. female with medical history significant of history of recurrent syncope, ESRD secondary to essential hypertension diabetes previously on dialysis status post SCD DDKT right pelvis 01/2023, essential hypertension, chronic orthostatic hypotension, history of recurrent UTI/sinusitis/URI, insulin -dependent DM type Il, hyperlipidemia, orthostatic hypotension due to diabetic neuropathy, diabetic retinopathy, severe PAD with right common Ilac artery stent 2017 and restenting again 2021, right BKA 2007 status post prosthesis, left SFA angioplasty February 2019 presented to emergency department via EMS for evaluation for syncope.  Patient reported that she has recurrent orthostatic hypotension over the course of last 1 week with significant increase of dizziness.   Assessment and Plan: No notes have been filed under this hospital service. Service: Hospitalist       Subjective: Patient is feeling better, no chest pain, no dizziness, no further syncope   Physical Exam: Vitals:   05/11/24 2300 05/12/24 0408 05/12/24 0755 05/12/24 1120  BP: (!) 154/59 (!) 198/72 (!) 166/79 (!) 152/73  Pulse: 92 89 89 93  Resp: 18 19 20 20   Temp: 98.7 F (37.1 C) 98.6 F (37 C) 98 F (36.7 C) 98.4 F (36.9 C)  TempSrc: Oral Oral Oral Oral  SpO2:  91% 94% 97%  Weight:      Height:       Neurology awake and alert ENT with mild pallor Cardiovascular with S1 and S2 present and regular with no gallops, rubs or murmurs Respiratory with no rales or wheezing, no rhonchi  Abdomen with no distention  No lower extremity edema. Right BKA   Data Reviewed:    Family Communication: no family at the bedside   Disposition: Status is: Inpatient Remains  inpatient appropriate because: recovering hypotension   Planned Discharge Destination: Home    Author: Elidia Toribio Furnace, MD 05/12/2024 4:06 PM  For on call review [email or web address].

## 2024-05-12 NOTE — Assessment & Plan Note (Signed)
 Blood pressure systolic 150 to 160 mmHg.   Plan to continue with amlodipine  5 mg and metoprolol .  Keep systolic less than 160 during her hospitalization, further adjustments as outpatient

## 2024-05-12 NOTE — Assessment & Plan Note (Signed)
 No claudication, continue statin and antiplatelet therapy

## 2024-05-12 NOTE — Assessment & Plan Note (Signed)
 Antihypertensive medications have been adjusted to avoid orthostatic hypotension.   Plan to continue monitoring today, if stable, plan for discharge home tomorrow.

## 2024-05-12 NOTE — Hospital Course (Signed)
 [name] was admitted to the hospital with the working diagnosis of syncope.   53 y.o. female with medical history significant of history of recurrent syncope, ESRD sp post renal transplant, hypertension, T2DM, chronic orthostatic hypotension, hyperlipidemia, orthostatic hypotension and severe PAD, right BKA who presented after a syncope episode.  Reported one week of orthostatic hypotension, associated with dizziness. On the day of admission while sitting in her car, passenger seat, she loss her consciousness, for about 2 minutes, having post syncope nausea and vomiting.  On her initial physical examination her blood pressure was 197/89, HR 102 and RR 17 and 02 saturation 99% Lungs with with no wheezing or rhonchi, heart with S1 and S2 present and regular with no gallops, rubs or murmurs, abdomen with no distention and no lower extremity edema on the left. Right BKA.   Na 138, K 2.8 Cl 90 bicarbonate 25 glucose 258, bun 20 cr 2,0  Mg 1,8  AST 37 and ALT 33  Ck  67  Wbc 7,2 hgb 13.5 plt 251  Influenza negative RSV negative Covid 19 negative   Chest radiograph with no effusions or infiltrates, mild cardiomegaly.   EKG 115 bpm, normal axis, qtc manually corrected 444, sinus rhythm with right atrial enlargement, no significant ST segment or T wave changes, positive LVH.   CT head negative for acute changes.  CT chest, abdomen and pelvis with no acute findings in the chest, abdomen and pelvis Small kidneys consistent with renal disease. No obstructive changes.  Right iliac fossa renal transplant without obstructive disease.   EEG negative for seizures.   Patient was placed on IV fluids.  Echocardiogram with preserved LV systolic function.  Clonidine  has held and patient was placed on low dose amlodipine  and resumed metoprolol  for blood pressure control.

## 2024-05-12 NOTE — Assessment & Plan Note (Addendum)
 Patient was placed on insulin  sliding scale for glucose cover and monitoring during her hospitalization.  Continue basal insulin  and tirazepatide at home  Her glucose remained stable, with fasting glucose on the day of discharge at 131 mg/dl.   Continue with statin

## 2024-05-12 NOTE — Assessment & Plan Note (Addendum)
 Hypokalemia, hyponatremia   Follow up renal function with serum cr at 1,3 with K at 2,9 and serum bicarbonate at 25  Na 136   Plan to add Kcl 40 meq x2 and follow up renal function and electrolytes in am.   Sp renal transplant, continue with tacrolimus  and mycophenolate 

## 2024-05-13 DIAGNOSIS — N179 Acute kidney failure, unspecified: Secondary | ICD-10-CM | POA: Diagnosis not present

## 2024-05-13 DIAGNOSIS — I10 Essential (primary) hypertension: Secondary | ICD-10-CM | POA: Diagnosis not present

## 2024-05-13 DIAGNOSIS — E1169 Type 2 diabetes mellitus with other specified complication: Secondary | ICD-10-CM | POA: Diagnosis not present

## 2024-05-13 DIAGNOSIS — I951 Orthostatic hypotension: Secondary | ICD-10-CM | POA: Diagnosis not present

## 2024-05-13 LAB — BASIC METABOLIC PANEL WITH GFR
Anion gap: 10 (ref 5–15)
BUN: 13 mg/dL (ref 6–20)
CO2: 26 mmol/L (ref 22–32)
Calcium: 8.4 mg/dL — ABNORMAL LOW (ref 8.9–10.3)
Chloride: 100 mmol/L (ref 98–111)
Creatinine, Ser: 1.3 mg/dL — ABNORMAL HIGH (ref 0.44–1.00)
GFR, Estimated: 49 mL/min — ABNORMAL LOW (ref >60)
Glucose, Bld: 131 mg/dL — ABNORMAL HIGH (ref 70–99)
Potassium: 3.4 mmol/L — ABNORMAL LOW (ref 3.5–5.1)
Sodium: 136 mmol/L (ref 135–145)

## 2024-05-13 LAB — GLUCOSE, CAPILLARY
Glucose-Capillary: 148 mg/dL — ABNORMAL HIGH (ref 70–99)
Glucose-Capillary: 218 mg/dL — ABNORMAL HIGH (ref 70–99)

## 2024-05-13 MED ORDER — FUROSEMIDE 40 MG PO TABS: 40.0000 mg | ORAL_TABLET | Freq: Every day | ORAL | Status: AC | PRN

## 2024-05-13 MED ORDER — POTASSIUM CHLORIDE 20 MEQ PO PACK
40.0000 meq | PACK | Freq: Once | ORAL | Status: DC
  Filled 2024-05-13: qty 2

## 2024-05-13 MED ORDER — METOPROLOL SUCCINATE ER 25 MG PO TB24: 25.0000 mg | ORAL_TABLET | Freq: Every day | ORAL | 0 refills | Status: DC

## 2024-05-13 MED ORDER — AMLODIPINE BESYLATE 5 MG PO TABS: 5.0000 mg | ORAL_TABLET | Freq: Every day | ORAL | 0 refills | Status: DC

## 2024-05-13 NOTE — TOC Transition Note (Signed)
 Transition of Care [facility]) - Discharge Note   Patient Details  Name: [name] MRN: [id number] Date of Birth: 03-13-1971  [facility] CM/SW Contact:  Landry DELENA Senters, RN Phone Number: 05/13/2024, 12:02 PM   Clinical Narrative:     Patient discharging to home today, lives with her mother, will get transportation home from her cousin. Patient reports she has a PCP, manages own medications, has all needed DME at home.   No needs identified by CM.  Final next level of care: Home/Self Care Barriers to Discharge: No Barriers Identified   Patient Goals and CMS Choice            Discharge Placement                       Discharge Plan and Services Additional resources added to the After Visit Summary for                                       Social Drivers of Health (SDOH) Interventions SDOH Screenings   Food Insecurity: Patient Declined (05/11/2024)  Housing: Unknown (05/11/2024)  Transportation Needs: Patient Declined (05/11/2024)  Utilities: Patient Declined (05/11/2024)  Depression (PHQ2-9): Low Risk  (12/27/2019)  Tobacco Use: Low Risk  (05/10/2024)     Readmission Risk Interventions     No data to display

## 2024-05-13 NOTE — Plan of Care (Signed)
   Problem: Fluid Volume: Goal: Ability to maintain a balanced intake and output will improve Outcome: Progressing

## 2024-05-13 NOTE — Discharge Summary (Signed)
 Physician Discharge Summary   Patient: [name] MRN: [id number] DOB: [birth year]  Admit date:     05/10/2024  Discharge date: 05/13/24  Discharge Physician: Elidia Sieving Quenisha Lovins   PCP: Catalina Bare, MD   Recommendations at discharge:   Metoprolol  dose has been decreased from 50 mg to 25 mg and amlodipine  decreased from 10 mg to 5 mg. Discontinue clonidine  and continue with only as needed furosemide .  Follow up renal function and electrolytes in 7 days as outpatient  Follow up with Dr Joelyn Bonsu in 7 to 10 days  Discharge Diagnoses: Principal Problem:   Recurrent syncope Active Problems:   Essential hypertension   AKI (acute kidney injury)   Type 2 diabetes mellitus with hyperlipidemia (HCC)   Peripheral artery disease  Resolved Problems:   * No resolved hospital problems. [facility] Course: [name] was admitted to the hospital with the working diagnosis of syncope.   53 y.o. female with medical history significant of history of recurrent syncope, ESRD sp post renal transplant, hypertension, T2DM, chronic orthostatic hypotension, hyperlipidemia, orthostatic hypotension and severe PAD, right BKA who presented after a syncope episode.  Reported one week of orthostatic hypotension, associated with dizziness. On the day of admission while sitting in her car, passenger seat, she loss her consciousness, for about 2 minutes, having post syncope nausea and vomiting.  On her initial physical examination her blood pressure was 197/89, HR 102 and RR 17 and 02 saturation 99% Lungs with with no wheezing or rhonchi, heart with S1 and S2 present and regular with no gallops, rubs or murmurs, abdomen with no distention and no lower extremity edema on the left. Right BKA.   Na 138, K 2.8 Cl 90 bicarbonate 25 glucose 258, bun 20 cr 2,0  Mg 1,8  AST 37 and ALT 33  Ck  67  Wbc 7,2 hgb 13.5 plt 251  Influenza negative RSV negative Covid 19 negative   Chest radiograph with no  effusions or infiltrates, mild cardiomegaly.   EKG 115 bpm, normal axis, qtc manually corrected 444, sinus rhythm with right atrial enlargement, no significant ST segment or T wave changes, positive LVH.   CT head negative for acute changes.  CT chest, abdomen and pelvis with no acute findings in the chest, abdomen and pelvis Small kidneys consistent with renal disease. No obstructive changes.  Right iliac fossa renal transplant without obstructive disease.   EEG negative for seizures.   Patient was placed on IV fluids.  Echocardiogram with preserved LV systolic function.  Clonidine  has held and patient was placed on low dose amlodipine  and resumed metoprolol  for blood pressure control.   Assessment and Plan: * Syncope due to orthostatic hypotension Antihypertensive medications have been adjusted to avoid orthostatic hypotension.    At the time of her discharge she has no longer orthostatic symptoms.  11/26 orthostatic vital sings.  Supine 150/60 with HR 90  Sitting 140/55 with HR 88  Standing 130/78 with HR 78  Standing after 3 minutes 136/60 with HR 80  With no symptoms,   Essential hypertension Blood pressure has been stable with systolic in the 150 mmHg range  Amlodipine  has been reduced to 5 mg and metoprolol  succinate to 25 mg daily.   AKI (acute kidney injury) Hypokalemia, hyponatremia   At the time of her discharge her renal function had a serum cr of 1,3 with K at 3,4 and serum bicarbonate at 26  Na 136   K was corrected with oral Kcl.  Sp renal transplant, continue with tacrolimus  and mycophenolate   Continue prophylactic antibiotic therapy Take furosemide  only as needed.   Type 2 diabetes mellitus with hyperlipidemia (HCC) Patient was placed on insulin  sliding scale for glucose cover and monitoring during her hospitalization.  Continue basal insulin  and tirazepatide at home  Her glucose remained stable, with fasting glucose on the day of discharge at 131 mg/dl.    Continue with statin   Peripheral artery disease No claudication, continue statin and antiplatelet therapy       Consultants: none  Procedures performed: none   Disposition: Home Diet recommendation:  Cardiac diet DISCHARGE MEDICATION: Allergies as of 05/13/2024       Reactions   Fish Allergy Anaphylaxis   Transplant donor allergic to all seafood, and patient instructed not to eat.    Shellfish Allergy Anaphylaxis   Transplant donor was allergic and patient instructed not to eat any type of seafood.        Medication List     STOP taking these medications    cloNIDine  0.2 mg/24hr patch Commonly known as: CATAPRES  - Dosed in mg/24 hr       TAKE these medications    albuterol  108 (90 Base) MCG/ACT inhaler Commonly known as: VENTOLIN  HFA Inhale 2 puffs into the lungs every 6 (six) hours as needed for wheezing or shortness of breath.   albuterol  0.63 MG/3ML nebulizer solution Commonly known as: ACCUNEB  Take 1 ampule by nebulization every 6 (six) hours as needed for wheezing or shortness of breath.   amLODipine  5 MG tablet Commonly known as: NORVASC  Take 1 tablet (5 mg total) by mouth daily. Start taking on: May 14, 2024 What changed:  medication strength how much to take additional instructions   clopidogrel  75 MG tablet Commonly known as: PLAVIX  Take 1 tablet (75 mg total) by mouth daily. What changed: when to take this   diphenoxylate-atropine 2.5-0.025 MG tablet Commonly known as: LOMOTIL Take 1 tablet by mouth 3 (three) times daily as needed for diarrhea or loose stools.   furosemide  40 MG tablet Commonly known as: LASIX  Take 1 tablet (40 mg total) by mouth daily as needed for edema or fluid. What changed:  when to take this reasons to take this   HumaLOG KwikPen 100 UNIT/ML KwikPen Generic drug: insulin  lispro Inject 0-5 Units into the skin 3 (three) times daily with meals as needed (DMII).   Lantus  SoloStar 100 UNIT/ML Solostar  Pen Generic drug: insulin  glargine Inject 10 Units into the skin 2 (two) times daily.   Melatonin 10 MG Tabs Take 20 mg by mouth at bedtime.   methenamine 1 g tablet Commonly known as: HIPREX Take 1 g by mouth 2 (two) times daily with a meal.   metoprolol  succinate 25 MG 24 hr tablet Commonly known as: TOPROL -XL Take 1 tablet (25 mg total) by mouth daily. Start taking on: May 14, 2024 What changed:  medication strength See the new instructions.   mycophenolate  180 MG EC tablet Commonly known as: MYFORTIC  Take 360 mg by mouth in the morning and at bedtime. (360mg ) in the morning and bedtime-( capsules )   ondansetron  8 MG disintegrating tablet Commonly known as: ZOFRAN -ODT Take 8 mg by mouth 3 (three) times daily.   pantoprazole  40 MG tablet Commonly known as: PROTONIX  Take 40 mg by mouth at bedtime.   rosuvastatin  20 MG tablet Commonly known as: CRESTOR  Take 1 tablet (20 mg total) by mouth daily. What changed: when to take this   sulfamethoxazole -trimethoprim   800-160 MG tablet Commonly known as: BACTRIM  DS SMARTSIG:1 Tablet(s) By Mouth Every 12 Hours   tacrolimus  1 MG capsule Commonly known as: PROGRAF  Take 2 mg by mouth in the morning and at bedtime.   tirzepatide 10 MG/0.5ML Pen Commonly known as: MOUNJARO Inject 10 mg into the skin every Wednesday.        Discharge Exam: Filed Weights   05/10/24 1421 05/11/24 1442  Weight: 66.7 kg 67.9 kg   BP (!) 122/48 (BP Location: Left Arm)   Pulse 89   Temp 98.8 F (37.1 C) (Oral)   Resp 20   Ht 5' (1.524 m)   Wt 67.9 kg   LMP 07/02/2022 (Approximate)   SpO2 95%   BMI 29.23 kg/m   Patient with no further orthostatic symptoms, no chest pain and no dyspnea.  Neurology awake and alert ENT with mild pallor Cardiovascular with S1 and S2 present and regular with no gallops, rubs or murmurs Respiratory with no rales or wheezing, no rhonchi  Abdomen with no distention  No lower extremity edema. Right  BKA   Condition at discharge: stable  The results of significant diagnostics from this hospitalization (including imaging, microbiology, ancillary and laboratory) are listed below for reference.   Imaging Studies: ECHOCARDIOGRAM COMPLETE Result Date: 05/11/2024    ECHOCARDIOGRAM REPORT   Patient Name:   [name] Date of Exam: 05/11/2024 Medical Rec #:  [id number]       Height:       60.0 in Accession #:    [id number]      Weight:       147.0 lb Date of Birth:  1970-08-13       BSA:          1.638 m Patient Age:    53 years        BP:           156/73 mmHg Patient Gender: F               HR:           103 bpm. Exam Location:  Inpatient Procedure: 2D Echo and Intracardiac Opacification Agent (Both Spectral and Color            Flow Doppler were utilized during procedure). Indications:    Syncope  History:        Patient has prior history of Echocardiogram examinations.  Sonographer:    Charmaine Gaskins Referring Phys: [id number] SUBRINA SUNDIL IMPRESSIONS  1. Severe concentric LVH up to 1.6 cm. Hyperdynamic LV function. Chordal SAM is present with resting gradient 42 mmHG, up to 107 mmHG with valsalva. Findings are concerning for hypertrophic cardiomyopathy with outflow obstruction. Would recommend cardiac MRI for further characterization. Left ventricular ejection fraction, by estimation, is 70 to 75%. The left ventricle has hyperdynamic function. The left ventricle has no regional wall motion abnormalities. There is severe concentric left ventricular hypertrophy. Left ventricular diastolic parameters are consistent with Grade I diastolic dysfunction (impaired relaxation).  2. Right ventricular systolic function is normal. The right ventricular size is normal. Tricuspid regurgitation signal is inadequate for assessing PA pressure.  3. A small pericardial effusion is present. The pericardial effusion is posterior to the left ventricle. There is no evidence of cardiac tamponade.  4. The mitral valve is  grossly normal. Trivial mitral valve regurgitation. No evidence of mitral stenosis.  5. The aortic valve is tricuspid. Aortic valve regurgitation is not visualized. No aortic stenosis is present.  6. The inferior vena  cava is normal in size with greater than 50% respiratory variability, suggesting right atrial pressure of 3 mmHg. FINDINGS  Left Ventricle: Severe concentric LVH up to 1.6 cm. Hyperdynamic LV function. Chordal SAM is present with resting gradient 42 mmHG, up to 107 mmHG with valsalva. Findings are concerning for hypertrophic cardiomyopathy with outflow obstruction. Would recommend cardiac MRI for further characterization. Left ventricular ejection fraction, by estimation, is 70 to 75%. The left ventricle has hyperdynamic function. The left ventricle has no regional wall motion abnormalities. Definity  contrast agent was given IV to delineate the left ventricular endocardial borders. The left ventricular internal cavity size was normal in size. There is severe concentric left ventricular hypertrophy. Left ventricular diastolic parameters are consistent with Grade I diastolic dysfunction (impaired relaxation). Right Ventricle: The right ventricular size is normal. No increase in right ventricular wall thickness. Right ventricular systolic function is normal. Tricuspid regurgitation signal is inadequate for assessing PA pressure. Left Atrium: Left atrial size was normal in size. Right Atrium: Right atrial size was normal in size. Pericardium: A small pericardial effusion is present. The pericardial effusion is posterior to the left ventricle. There is no evidence of cardiac tamponade. Mitral Valve: The mitral valve is grossly normal. Trivial mitral valve regurgitation. No evidence of mitral valve stenosis. Tricuspid Valve: The tricuspid valve is grossly normal. Tricuspid valve regurgitation is trivial. No evidence of tricuspid stenosis. Aortic Valve: The aortic valve is tricuspid. Aortic valve  regurgitation is not visualized. No aortic stenosis is present. Pulmonic Valve: The pulmonic valve was grossly normal. Pulmonic valve regurgitation is trivial. No evidence of pulmonic stenosis. Aorta: The aortic root and ascending aorta are structurally normal, with no evidence of dilitation. Venous: The right lower pulmonary vein is normal. The inferior vena cava is normal in size with greater than 50% respiratory variability, suggesting right atrial pressure of 3 mmHg. IAS/Shunts: The atrial septum is grossly normal.  LEFT VENTRICLE PLAX 2D LVIDd:         2.60 cm   Diastology LVIDs:         1.60 cm   LV e' medial:    7.46 cm/s LV PW:         1.60 cm   LV E/e' medial:  8.2 LV IVS:        1.60 cm   LV e' lateral:   10.90 cm/s LVOT diam:     2.00 cm   LV E/e' lateral: 5.6 LVOT Area:     3.14 cm  RIGHT VENTRICLE RV Basal diam:  1.90 cm RV Mid diam:    1.60 cm RV S prime:     19.40 cm/s LEFT ATRIUM             Index        RIGHT ATRIUM          Index LA diam:        2.90 cm 1.77 cm/m   RA Area:     9.97 cm LA Vol (A2C):   25.2 ml 15.39 ml/m  RA Volume:   18.90 ml 11.54 ml/m LA Vol (A4C):   30.9 ml 18.87 ml/m LA Biplane Vol: 30.5 ml 18.62 ml/m   AORTA Ao Root diam: 2.60 cm Ao Asc diam:  2.70 cm MITRAL VALVE MV Area (PHT): 4.41 cm    SHUNTS MV Decel Time: 172 msec    Systemic Diam: 2.00 cm MV E velocity: 61.30 cm/s MV A velocity: 97.90 cm/s MV E/A ratio:  0.63 Darryle Decent MD Electronically  signed by Darryle Decent MD Signature Date/Time: 05/11/2024/11:47:26 AM    Final    EEG adult Result Date: 05/11/2024 Gregg Lek, MD     05/11/2024  9:34 AM Patient Name: [name] MRN: [id number] Epilepsy Attending: Lek Gregg Referring Physician/Provider: No ref. provider found     Date: 05/11/2024 Duration: 22 minutes Patient history: 53 year old woman with syncope vs. Seizure Level of alertness: Awake, drowsy, sleep AEDs during EEG study: None Technical aspects: This EEG study was done with scalp electrodes  positioned according to the 10-20 International system of electrode placement. Electrical activity was reviewed with band pass filter of 1-70Hz , sensitivity of 7 uV/mm, display speed of 75mm/sec with a 60Hz  notched filter applied as appropriate. EEG data were recorded continuously and digitally stored.  Video monitoring was available and reviewed as appropriate. Description: The posterior dominant rhythm consists of 10 Hz activity of moderate voltage (25-35 uV) seen predominantly in posterior head regions, symmetric and reactive to eye opening and eye closing. Drowsiness was characterized by attenuation of the posterior background rhythm. Sleep was characterized by vertex waves, sleep spindles (12 to 14 Hz), maximal frontocentral region.  Hyperventilation and photic stimulation were not performed.   ABNORMALITY -None IMPRESSION: This study is within normal limits. No seizures or epileptiform discharges were seen throughout the recording. A normal interictal EEG does not exclude nor support the diagnosis of epilepsy. Lek Gregg MD Neurology    CT CHEST ABDOMEN PELVIS WO CONTRAST Result Date: 05/10/2024 EXAM: CT CHEST, ABDOMEN AND PELVIS WITHOUT CONTRAST 05/10/2024 07:26:19 PM TECHNIQUE: CT of the chest, abdomen and pelvis was performed without the administration of intravenous contrast. Multiplanar reformatted images are provided for review. Automated exposure control, iterative reconstruction, and/or weight based adjustment of the mA/kV was utilized to reduce the radiation dose to as low as reasonably achievable. COMPARISON: 04/02/2015 CLINICAL HISTORY: Chest and abdominal pain. FINDINGS: LIMITATIONS: Somewhat limited due to lack of IV contrast. CHEST: MEDIASTINUM AND LYMPH NODES: Heart is at the upper limits of normal in size. Minimal pericardial effusion is seen. Mild coronary calcifications are seen. The central airways are clear. No mediastinal, hilar or axillary lymphadenopathy. The esophagus, as  visualized, is within normal limits. Thoracic inlet is within normal limits. LUNGS AND PLEURA: The lungs are well aerated bilaterally. No focal infiltrate or pulmonary edema. No pleural effusion or pneumothorax. No sizable parenchymal nodule is seen. ABDOMEN AND PELVIS: LIVER: The liver is within normal limits. GALLBLADDER AND BILE DUCTS: The gallbladder has been surgically removed. No biliary ductal dilatation. SPLEEN: The spleen is unremarkable. PANCREAS: The pancreas is unremarkable. ADRENAL GLANDS: The adrenal glands are within normal limits. KIDNEYS, URETERS AND BLADDER: The native kidneys are small. No obstructive changes are seen in the native kidneys. A right iliac fossa renal transplant is seen without obstructive change. No stones in the kidneys or ureters. The bladder is unremarkable. GI AND BOWEL: Stomach and small bowel are within normal limits. Scattered fecal material is noted throughout the colon. No obstructive or inflammatory changes are noted in the colon. The appendix is within normal limits. REPRODUCTIVE ORGANS: The uterus is within normal limits. PERITONEUM AND RETROPERITONEUM: No free fluid is seen. No ascites. No free air. VASCULATURE: Atherosclerotic calcifications of the thoracic aorta are noted. Vascular calcifications are seen as well as diffuse arterial stenting of the right iliac system. ABDOMINAL AND PELVIS LYMPH NODES: No lymphadenopathy. BONES AND SOFT TISSUES: Osseous structures of the chest are within normal limits. No rib abnormality is seen. No bony  abnormality is noted in the abdomen and pelvis. No focal soft tissue abnormality. IMPRESSION: 1. No acute findings in the chest, abdomen, and pelvis. 2. Small kidneys consistent with renal disease. No obstructive changes. 3. Right iliac fossa renal transplant without obstructive change. Electronically signed by: Oneil Devonshire MD 05/10/2024 08:00 PM EST RP Workstation: [id number]   CT Head Wo Contrast Result Date: 05/10/2024 EXAM: CT  HEAD WITHOUT CONTRAST 05/10/2024 07:26:19 PM TECHNIQUE: CT of the head was performed without the administration of intravenous contrast. Automated exposure control, iterative reconstruction, and/or weight based adjustment of the mA/kV was utilized to reduce the radiation dose to as low as reasonably achievable. COMPARISON: 06/28/2018 CLINICAL HISTORY: Syncope/presyncope, cerebrovascular cause suspected. FINDINGS: BRAIN AND VENTRICLES: No acute hemorrhage. No evidence of acute infarct. No hydrocephalus. No extra-axial collection. No mass effect or midline shift. ORBITS: No acute abnormality. SINUSES: No acute abnormality. SOFT TISSUES AND SKULL: No acute soft tissue abnormality. No skull fracture. IMPRESSION: 1. No acute intracranial abnormality. Electronically signed by: Franky Crease MD 05/10/2024 07:56 PM EST RP Workstation: [id number]   DG Chest Portable 1 View Result Date: 05/10/2024 CLINICAL DATA:  Clemens, syncope EXAM: PORTABLE CHEST 1 VIEW COMPARISON:  07/28/2019 FINDINGS: Single frontal view of the chest demonstrates an unremarkable cardiac silhouette. No acute airspace disease, effusion, or pneumothorax. No acute bony abnormalities. Rounded calcification overlying the left anterior sixth rib may reflect soft tissue calcification within the left breast versus underlying left lower lobe granuloma. IMPRESSION: 1. No acute intrathoracic process. Electronically Signed   By: Ozell Daring M.D.   On: 05/10/2024 15:12    Microbiology: Results for orders placed or performed during the hospital encounter of 05/10/24  Resp panel by RT-PCR (RSV, Flu A&B, Covid) Anterior Nasal Swab     Status: None   Collection Time: 05/10/24  3:01 PM   Specimen: Anterior Nasal Swab  Result Value Ref Range Status   SARS Coronavirus 2 by RT PCR NEGATIVE NEGATIVE Final   Influenza A by PCR NEGATIVE NEGATIVE Final   Influenza B by PCR NEGATIVE NEGATIVE Final    Comment: (NOTE) The Xpert Xpress SARS-CoV-2/FLU/RSV plus assay is  intended as an aid in the diagnosis of influenza from Nasopharyngeal swab specimens and should not be used as a sole basis for treatment. Nasal washings and aspirates are unacceptable for Xpert Xpress SARS-CoV-2/FLU/RSV testing.  Fact Sheet for Patients: bloggercourse.com  Fact Sheet for Healthcare Providers: seriousbroker.it  This test is not yet approved or cleared by the [place]  FDA and has been authorized for detection and/or diagnosis of SARS-CoV-2 by FDA under an Emergency Use Authorization (EUA). This EUA will remain in effect (meaning this test can be used) for the duration of the COVID-19 declaration under Section 564(b)(1) of the Act, 21 U.S.C. section 360bbb-3(b)(1), unless the authorization is terminated or revoked.     Resp Syncytial Virus by PCR NEGATIVE NEGATIVE Final    Comment: (NOTE) Fact Sheet for Patients: bloggercourse.com  Fact Sheet for Healthcare Providers: seriousbroker.it  This test is not yet approved or cleared by the [place]  FDA and has been authorized for detection and/or diagnosis of SARS-CoV-2 by FDA under an Emergency Use Authorization (EUA). This EUA will remain in effect (meaning this test can be used) for the duration of the COVID-19 declaration under Section 564(b)(1) of the Act, 21 U.S.C. section 360bbb-3(b)(1), unless the authorization is terminated or revoked.  Performed at [facility], [address], [place], [place] [address]   Urine Culture  Status: Abnormal   Collection Time: 05/10/24  5:15 PM   Specimen: Urine, Random  Result Value Ref Range Status   Specimen Description URINE, RANDOM  Final   Special Requests   Final    URINE, CLEAN CATCH Performed at [facility], [address], [place], [place] [address]    Culture MULTIPLE SPECIES PRESENT, SUGGEST RECOLLECTION (A)  Final   Report Status  05/11/2024 FINAL  Final    Labs: CBC: Recent Labs  Lab 05/10/24 1435 05/10/24 1502 05/10/24 1706 05/11/24 0529  WBC 7.2  --   --  7.3  NEUTROABS 5.1  --   --   --   HGB 13.5 15.6* 15.3* 11.3*  HCT 43.4 46.0 45.0 36.2  MCV 78.3*  --   --  77.7*  PLT 251  --   --  237   Basic Metabolic Panel: Recent Labs  Lab 05/10/24 1435 05/10/24 1502 05/10/24 2237 05/11/24 0529 05/12/24 0227 05/12/24 2008 05/13/24 0225  NA 138   < > 142 139 136 137 136  K 2.8*   < > 3.3* 3.5 2.9* 3.1* 3.4*  CL 90*   < > 98 101 98 98 100  CO2 25  --  24 22 25 27 26   GLUCOSE 258*   < > 142* 80 148* 187* 131*  BUN 20   < > 15 15 17 13 13   CREATININE 2.04*   < > 1.63* 1.57* 1.34* 1.40* 1.30*  CALCIUM  9.4  --  8.7* 8.6* 8.8* 8.9 8.4*  MG 1.8  --   --   --   --  1.5*  --    < > = values in this interval not displayed.   Liver Function Tests: Recent Labs  Lab 05/10/24 1435 05/11/24 0529 05/12/24 0227  AST 37 22 18  ALT 33 21 17  ALKPHOS 157* 109 98  BILITOT 0.9 1.1 0.5  PROT 7.6 5.8* 5.6*  ALBUMIN  3.4* 2.6* 2.7*   CBG: Recent Labs  Lab 05/12/24 0600 05/12/24 1143 05/12/24 1539 05/12/24 2138 05/13/24 0630  GLUCAP 158* 182* 227* 225* 148*    Discharge time spent: greater than 30 minutes.  Signed: Elidia Toribio Furnace, MD [facility] 05/13/2024

## 2024-05-14 ENCOUNTER — Telehealth: Payer: Self-pay

## 2024-05-14 NOTE — Transitions of Care (Post Inpatient/ED Visit) (Signed)
   05/14/2024  Name: [name] MRN: [id number] DOB: [birth year]  Today's TOC FU Call Status: Today's TOC FU Call Status:: Unsuccessful Call (1st Attempt) Unsuccessful Call (1st Attempt) Date: 05/14/24  Attempted to reach the patient regarding the most recent Inpatient/ED visit.  Follow Up Plan: Additional outreach attempts will be made to reach the patient to complete the Transitions of Care (Post Inpatient/ED visit) call.   Cherina Dhillon J. Texanna Hilburn RN, MSN [facility], [facility] RN Care Manager Direct Dial: [phone]  Fax: [phone] Website: [email or web address]

## 2024-05-17 ENCOUNTER — Telehealth: Payer: Self-pay

## 2024-05-17 ENCOUNTER — Encounter: Payer: Self-pay | Admitting: Neurology

## 2024-05-17 NOTE — Transitions of Care (Post Inpatient/ED Visit) (Signed)
   05/17/2024  Name: [name] MRN: [id number] DOB: 1971/05/08  Today's TOC FU Call Status: Today's TOC FU Call Status:: Unsuccessful Call (2nd Attempt) Unsuccessful Call (2nd Attempt) Date: 05/17/24  Attempted to reach the patient regarding the most recent Inpatient/ED visit. Left a HIPAA approved voicemail message to phone number provided in demographics per DPR.    Follow Up Plan: Additional outreach attempts will be made to reach the patient to complete the Transitions of Care (Post Inpatient/ED visit) call.   Richerd Fish, RN, BSN, CCM [facility], [facility] Management Coordinator Direct Dial: [phone]

## 2024-05-18 ENCOUNTER — Telehealth: Payer: Self-pay

## 2024-05-18 NOTE — Progress Notes (Deleted)
 Cardiology Office Note:  .   Date:  05/18/2024  ID:  [name], DOB June 25, 1970, MRN [id number] PCP: Catalina Bare, MD  [facility] Providers Cardiologist:  Gordy Bergamo, MD { Click to update primary MD,subspecialty MD or APP then REFRESH:1}  History of Present Illness: .   [name] is a 53 y.o. African-American female patient who is a renal transplant recipient on 01/23/2013, supine hypertension and severe orthostatic hypotension due to diabetic autonomic neuropathy, diabetic retinopathy, hypercholesterolemia and PAD SP R BKA in 2017 and walks with the help of prosthesis, history of left SFA angioplasty in 2019 presents for posthospital follow-up, admitted with syncope on 05/10/2024 and found to be severely orthostatic.  Amlodipine  dose reduced in half and metoprolol  dose also reduced in half and discharged after hydration.  Echocardiogram on 05/11/2024 revealed hyperdynamic LVEF with severe LVH consistent with HOCM and a small pericardial effusion, recommended cardiac MR.     Discussed the use of AI scribe software for clinical note transcription with the patient, who gave verbal consent to proceed.  History of Present Illness   Cardiac Studies relevent.    ECHOCARDIOGRAM COMPLETE 05/11/2024  1. Severe concentric LVH up to 1.6 cm. Hyperdynamic LV function. Chordal SAM is present with resting gradient 42 mmHG, up to 107 mmHG with valsalva. Findings are concerning for hypertrophic cardiomyopathy with outflow obstruction. Would recommend cardiac MRI for further characterization. Left ventricular ejection fraction, by estimation, is 70 to 75%. The left ventricle has hyperdynamic function. The left ventricle has no regional wall motion abnormalities. There is severe concentric left ventricular hypertrophy. Left ventricular diastolic parameters are consistent with Grade I diastolic dysfunction (impaired relaxation). 2. Right ventricular systolic function is normal. The right  ventricular size is normal. Tricuspid regurgitation signal is inadequate for assessing PA pressure. 3. A small pericardial effusion is present. The pericardial effusion is posterior to the left ventricle. There is no evidence of cardiac tamponade.   Labs   Lab Results  Component Value Date   CHOL 171 06/03/2019   HDL 88 06/03/2019   LDLCALC 70 06/03/2019   LDLDIRECT 220 (H) 03/31/2009   TRIG 71 06/03/2019   CHOLHDL 3.5 09/03/2018   Lipoprotein (a)  Date/Time Value Ref Range Status  06/03/2019 01:54 PM <8.4 <75.0 nmol/L Final    Comment:    **Results verified by repeat testing** Note:  Values greater than or equal to 75.0 nmol/L may        indicate an independent risk factor for CHD,        but must be evaluated with caution when applied        to non-Caucasian populations due to the        influence of genetic factors on Lp(a) across        ethnicities.     Recent Labs    05/12/24 0227 05/12/24 2008 05/13/24 0225  NA 136 137 136  K 2.9* 3.1* 3.4*  CL 98 98 100  CO2 25 27 26   GLUCOSE 148* 187* 131*  BUN 17 13 13   CREATININE 1.34* 1.40* 1.30*  CALCIUM  8.8* 8.9 8.4*  GFRNONAA 47* 45* 49*    Lab Results  Component Value Date   ALT 17 05/12/2024   AST 18 05/12/2024   ALKPHOS 98 05/12/2024   BILITOT 0.5 05/12/2024      Latest Ref Rng & Units 05/11/2024    5:29 AM 05/10/2024    5:06 PM 05/10/2024    3:02 PM  CBC  WBC 4.0 - 10.5 K/uL 7.3     Hemoglobin 12.0 - 15.0 g/dL 88.6  84.6  84.3   Hematocrit 36.0 - 46.0 % 36.2  45.0  46.0   Platelets 150 - 400 K/uL 237      Lab Results  Component Value Date   HGBA1C 8.3 (H) 05/10/2024    Lab Results  Component Value Date   TSH 5.204 (H) 03/30/2011    Care everywhere/Faxed External Labs:  ***  ROS  ***ROS Physical Exam:   VS:  LMP 07/02/2022 (Approximate)    Wt Readings from Last 3 Encounters:  05/11/24 149 lb 11.1 oz (67.9 kg)  12/11/23 168 lb 3.2 oz (76.3 kg)  02/26/23 176 lb (79.8 kg)    BP Readings  from Last 3 Encounters:  05/13/24 (!) 122/48  12/11/23 138/85  02/26/23 (!) 184/84   ***Physical Exam EKG:         ASSESSMENT AND PLAN: .      ICD-10-CM   1. Syncope due to orthostatic hypotension  I95.1     2. Supine hypertension  I10     3. Type 2 diabetes mellitus with diabetic autonomic neuropathy, with long-term current use of insulin  (HCC)  E11.43    Z79.4     4. Kidney transplant recipient  Z94.0      Assessment and Plan Assessment & Plan    Follow up: *** Signed,  Gordy Bergamo, MD, [facility] 05/18/2024, 6:39 AM [facility] [address] [place], [place] [address] Phone: [phone]. Fax:  [phone]

## 2024-05-18 NOTE — Transitions of Care (Post Inpatient/ED Visit) (Signed)
   05/18/2024  Name: [name] MRN: [id number] DOB: Aug 30, 1970  Today's TOC FU Call Status: Today's TOC FU Call Status:: Unsuccessful Call (3rd Attempt) Unsuccessful Call (3rd Attempt) Date: 05/18/24  Attempted to reach the patient regarding the most recent Inpatient/ED visit.  Follow Up Plan: No further outreach attempts will be made at this time. We have been unable to contact the patient.  Alan Ee, RN, BSN, CEN [employer]- Transition of Care Team.  [facility] [phone]

## 2024-05-19 ENCOUNTER — Ambulatory Visit: Admitting: Cardiology

## 2024-05-19 DIAGNOSIS — Z794 Long term (current) use of insulin: Secondary | ICD-10-CM

## 2024-05-19 DIAGNOSIS — I10 Essential (primary) hypertension: Secondary | ICD-10-CM

## 2024-05-19 DIAGNOSIS — Z940 Kidney transplant status: Secondary | ICD-10-CM

## 2024-05-19 DIAGNOSIS — I951 Orthostatic hypotension: Secondary | ICD-10-CM

## 2024-05-20 ENCOUNTER — Ambulatory Visit: Attending: Cardiology | Admitting: Cardiology

## 2024-05-20 ENCOUNTER — Encounter: Payer: Self-pay | Admitting: Cardiology

## 2024-05-20 VITALS — BP 180/92 | HR 107 | Resp 16 | Ht 60.0 in | Wt 150.2 lb

## 2024-05-20 DIAGNOSIS — E7800 Pure hypercholesterolemia, unspecified: Secondary | ICD-10-CM | POA: Diagnosis not present

## 2024-05-20 DIAGNOSIS — I517 Cardiomegaly: Secondary | ICD-10-CM

## 2024-05-20 DIAGNOSIS — I10 Essential (primary) hypertension: Secondary | ICD-10-CM | POA: Diagnosis not present

## 2024-05-20 DIAGNOSIS — I951 Orthostatic hypotension: Secondary | ICD-10-CM | POA: Diagnosis not present

## 2024-05-20 MED ORDER — METOPROLOL SUCCINATE ER 25 MG PO TB24: 25.0000 mg | ORAL_TABLET | Freq: Every day | ORAL | 0 refills | Status: DC

## 2024-05-20 MED ORDER — AMLODIPINE BESYLATE 5 MG PO TABS: 5.0000 mg | ORAL_TABLET | Freq: Every day | ORAL | 0 refills | Status: DC

## 2024-05-20 NOTE — Patient Instructions (Signed)
 Medication Instructions:  Please take your amlodipine  and toprol  at night time.   *If you need a refill on your cardiac medications before your next appointment, please call your pharmacy*  Lab Work: None.  If you have labs (blood work) drawn today and your tests are completely normal, you will receive your results only by: MyChart Message (if you have MyChart) OR A paper copy in the mail If you have any lab test that is abnormal or we need to change your treatment, we will call you to review the results.  Testing/Procedures: None.  Follow-Up: At [facility], you and your health needs are our priority.  As part of our continuing mission to provide you with exceptional heart care, our providers are all part of one team.  This team includes your primary Cardiologist (physician) and Advanced Practice Providers or APPs (Physician Assistants and Nurse Practitioners) who all work together to provide you with the care you need, when you need it.  Your next appointment:   1 year(s)  Provider:   Gordy Bergamo, MD

## 2024-05-20 NOTE — Progress Notes (Signed)
 Cardiology Office Note:  .   Date:  05/20/2024  ID:  [name], DOB [birth year], MRN [id number] PCP: Catalina Bare, MD  [facility] Providers Cardiologist:  Gordy Bergamo, MD   History of Present Illness: .   [name] is a 53 y.o. African-American female patient who is a renal transplant recipient on 01/23/2013, supine hypertension and severe orthostatic hypotension due to diabetic autonomic neuropathy, diabetic retinopathy, hypercholesterolemia and PAD SP R BKA in 2017 and walks with the help of prosthesis, history of left SFA angioplasty in 2019 presents for posthospital follow-up, admitted with syncope on 05/10/2024 and found to be severely orthostatic.  Amlodipine  dose reduced in half and metoprolol  dose also reduced in half and discharged after hydration.  Echocardiogram on 05/11/2024 revealed hyperdynamic LVEF with severe LVH consistent with HOCM and a small pericardial effusion, recommended cardiac MR.     Discussed the use of AI scribe software for clinical note transcription with the patient, who gave verbal consent to proceed.  History of Present Illness [name] is a 53 year old female with hypertension who presents with episodes of passing out due to low blood pressure. She is accompanied by her mother.  She has recurrent syncope attributed to low blood pressure, with marked orthostatic drop. Recent measurements showed 190 mmHg sitting and 107/68 mmHg after standing for three minutes, which led to a hospitalization and adjustment of her antihypertensive medications.  She lost weight from 180 lbs to 147 lbs while on Mounjaro. With this weight loss she no longer has swelling and Lasix  was stopped.  She currently takes amlodipine  and metoprolol  in the morning. During her recent hospitalization she was treated with eight bags of potassium and one bag of magnesium  for dehydration with poor oral intake.  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 She is mostly sedentary and is only on her feet for short periods such as shopping or walking from the parking lot to class.  Cardiac Studies relevent.    ECHOCARDIOGRAM COMPLETE 05/11/2024  1. Severe concentric LVH up to 1.6 cm. Hyperdynamic LV function. Chordal SAM is present with resting gradient 42 mmHG, up to 107 mmHG with valsalva. Findings are concerning for hypertrophic cardiomyopathy with outflow obstruction. Would recommend cardiac MRI for further characterization. Left ventricular ejection fraction, by estimation, is 70 to 75%. The left ventricle has hyperdynamic function. The left ventricle has no regional wall motion abnormalities. There is severe concentric left ventricular hypertrophy. Left ventricular diastolic parameters are consistent with Grade I diastolic dysfunction (impaired relaxation). 2. Right ventricular systolic function is normal. The right ventricular size is normal. Tricuspid regurgitation signal is inadequate for assessing PA pressure. 3. A small pericardial effusion is present. The pericardial effusion is posterior to the left ventricle. There is no evidence of cardiac tamponade.   Labs   Lipid profile 01/26/2023:  Total cholesterol 133, triglycerides 145, HDL 59, LDL 49.  Recent Labs    05/12/24 0227 05/12/24 2008 05/13/24 0225  NA 136 137 136  K 2.9* 3.1* 3.4*  CL 98 98 100  CO2 25 27 26   GLUCOSE 148* 187* 131*  BUN 17 13 13   CREATININE 1.34* 1.40* 1.30*  CALCIUM  8.8* 8.9 8.4*  GFRNONAA 47* 45* 49*    Lab Results  Component Value Date   ALT 17 05/12/2024   AST 18 05/12/2024   ALKPHOS 98 05/12/2024   BILITOT 0.5 05/12/2024      Latest Ref Rng & Units 05/11/2024  5:29 AM 05/10/2024    5:06 PM 05/10/2024    3:02 PM  CBC  WBC 4.0 - 10.5 K/uL 7.3     Hemoglobin 12.0 - 15.0 g/dL 88.6  84.6  84.3   Hematocrit 36.0 - 46.0 % 36.2  45.0  46.0   Platelets 150 - 400 K/uL 237      Lab Results  Component Value Date   HGBA1C 8.3 (H)  05/10/2024    Lab Results  Component Value Date   TSH 5.204 (H) 03/30/2011     ROS  Review of Systems  Cardiovascular:  Positive for near-syncope. Negative for chest pain, dyspnea on exertion and leg swelling.  Neurological:  Positive for dizziness.   Physical Exam:   VS:  BP (!) 180/92 (BP Location: Left Arm, Patient Position: Sitting, Cuff Size: Normal)   Pulse (!) 107   Resp 16   Ht 5' (1.524 m)   Wt 150 lb 3.2 oz (68.1 kg)   LMP 07/02/2022 (Approximate)   SpO2 93%   BMI 29.33 kg/m    Wt Readings from Last 3 Encounters:  05/20/24 150 lb 3.2 oz (68.1 kg)  05/11/24 149 lb 11.1 oz (67.9 kg)  12/11/23 168 lb 3.2 oz (76.3 kg)    BP Readings from Last 3 Encounters:  05/20/24 (!) 180/92  05/13/24 (!) 122/48  12/11/23 138/85   Physical Exam Neck:     Vascular: Carotid bruit (bilateral) present. No JVD.  Cardiovascular:     Rate and Rhythm: Normal rate and regular rhythm.     Heart sounds: S1 normal and S2 normal. Murmur heard.     Early systolic murmur is present with a grade of 2/6 at the upper right sternal border. Right arm AV fistula present and hum heard over upper precordium     No gallop.  Pulmonary:     Effort: Pulmonary effort is normal.     Breath sounds: Normal breath sounds.  Abdominal:     General: Bowel sounds are normal.     Palpations: Abdomen is soft.  Musculoskeletal:        General: Deformity (Right BKA, mechanical prosthesis present) present.     Left lower leg: No edema.  Skin:    General: Skin is warm.     Capillary Refill: Capillary refill takes less than 2 seconds. Left leg   EKG:         ASSESSMENT AND PLAN: .      ICD-10-CM   1. Orthostatic hypotension  I95.1     2. Supine hypertension  I10     3. LVH (left ventricular hypertrophy)  I51.7     4. Pure hypercholesterolemia  E78.00      Assessment & Plan Orthostatic hypotension and supine hypertension Orthostatic hypotension with episodes of syncope due to low blood pressure  upon standing. Supine hypertension with elevated blood pressure when lying down. Recent hospitalization for low blood pressure and seizure due to inadequate cerebral perfusion. Current blood pressure management includes amlodipine  and metoprolol , with adjustments made to prevent hypotension and syncope. Weight loss noted, which may contribute to improved blood pressure control. No current use of Lasix  due to lack of swelling. - Instructed to take amlodipine  and metoprolol  at night to manage supine hypertension. - Advised to sleep reclined to prevent supine hypertension. - Instructed to stand for three minutes before measuring blood pressure to assess orthostatic changes. - Advised to contact provider if symptoms persist or worsen.  Severe LVH suggestive of HOCM She has  had longstanding hypertension.  There is no family history of sudden cardiac death and no family history of HOCM.  This is hypertensive heart disease.  Hence do not think she needs MRI.  Pure hypercholesterolemia Reviewed external labs, lipids under excellent control.  Presently on Crestor  20 mg daily, continue the same.   Follow up: 1 Year for orthostatic hypotension, PAD and hyperchol and if stable PRN Signed,  Gordy Bergamo, MD, [facility] 05/20/2024, 2:37 PM [facility] [address] [place], [place] [address] Phone: [phone]. Fax:  [phone]

## 2024-06-03 ENCOUNTER — Other Ambulatory Visit: Payer: Self-pay

## 2024-06-03 ENCOUNTER — Emergency Department (HOSPITAL_COMMUNITY)

## 2024-06-03 ENCOUNTER — Encounter (HOSPITAL_COMMUNITY): Payer: Self-pay | Admitting: Emergency Medicine

## 2024-06-03 ENCOUNTER — Inpatient Hospital Stay (HOSPITAL_COMMUNITY)
Admission: EM | Admit: 2024-06-03 | Discharge: 2024-06-07 | DRG: 698 | Disposition: A | Discharge status: Home / Self Care | Source: Ambulatory Visit | Attending: Internal Medicine | Admitting: Internal Medicine

## 2024-06-03 DIAGNOSIS — Z89021 Acquired absence of right finger(s): Secondary | ICD-10-CM

## 2024-06-03 DIAGNOSIS — I5032 Chronic diastolic (congestive) heart failure: Secondary | ICD-10-CM | POA: Diagnosis present

## 2024-06-03 DIAGNOSIS — T8619 Other complication of kidney transplant: Principal | ICD-10-CM | POA: Diagnosis present

## 2024-06-03 DIAGNOSIS — E7800 Pure hypercholesterolemia, unspecified: Secondary | ICD-10-CM | POA: Diagnosis present

## 2024-06-03 DIAGNOSIS — R197 Diarrhea, unspecified: Secondary | ICD-10-CM | POA: Diagnosis present

## 2024-06-03 DIAGNOSIS — Y830 Surgical operation with transplant of whole organ as the cause of abnormal reaction of the patient, or of later complication, without mention of misadventure at the time of the procedure: Secondary | ICD-10-CM | POA: Diagnosis present

## 2024-06-03 DIAGNOSIS — Z8249 Family history of ischemic heart disease and other diseases of the circulatory system: Secondary | ICD-10-CM

## 2024-06-03 DIAGNOSIS — Z1152 Encounter for screening for COVID-19: Secondary | ICD-10-CM

## 2024-06-03 DIAGNOSIS — E861 Hypovolemia: Secondary | ICD-10-CM | POA: Diagnosis present

## 2024-06-03 DIAGNOSIS — I739 Peripheral vascular disease, unspecified: Secondary | ICD-10-CM

## 2024-06-03 DIAGNOSIS — Z79899 Other long term (current) drug therapy: Secondary | ICD-10-CM

## 2024-06-03 DIAGNOSIS — Z940 Kidney transplant status: Secondary | ICD-10-CM

## 2024-06-03 DIAGNOSIS — Z7902 Long term (current) use of antithrombotics/antiplatelets: Secondary | ICD-10-CM

## 2024-06-03 DIAGNOSIS — Z8349 Family history of other endocrine, nutritional and metabolic diseases: Secondary | ICD-10-CM

## 2024-06-03 DIAGNOSIS — Z7960 Long term (current) use of unspecified immunomodulators and immunosuppressants: Secondary | ICD-10-CM

## 2024-06-03 DIAGNOSIS — E11319 Type 2 diabetes mellitus with unspecified diabetic retinopathy without macular edema: Secondary | ICD-10-CM | POA: Diagnosis present

## 2024-06-03 DIAGNOSIS — Z794 Long term (current) use of insulin: Secondary | ICD-10-CM

## 2024-06-03 DIAGNOSIS — E1151 Type 2 diabetes mellitus with diabetic peripheral angiopathy without gangrene: Secondary | ICD-10-CM | POA: Diagnosis present

## 2024-06-03 DIAGNOSIS — I951 Orthostatic hypotension: Secondary | ICD-10-CM | POA: Diagnosis present

## 2024-06-03 DIAGNOSIS — I43 Cardiomyopathy in diseases classified elsewhere: Secondary | ICD-10-CM | POA: Diagnosis present

## 2024-06-03 DIAGNOSIS — I10 Essential (primary) hypertension: Secondary | ICD-10-CM | POA: Diagnosis present

## 2024-06-03 DIAGNOSIS — K219 Gastro-esophageal reflux disease without esophagitis: Secondary | ICD-10-CM | POA: Diagnosis present

## 2024-06-03 DIAGNOSIS — R55 Syncope and collapse: Secondary | ICD-10-CM

## 2024-06-03 DIAGNOSIS — E876 Hypokalemia: Secondary | ICD-10-CM | POA: Diagnosis present

## 2024-06-03 DIAGNOSIS — R112 Nausea with vomiting, unspecified: Principal | ICD-10-CM | POA: Diagnosis present

## 2024-06-03 DIAGNOSIS — E1122 Type 2 diabetes mellitus with diabetic chronic kidney disease: Secondary | ICD-10-CM | POA: Diagnosis present

## 2024-06-03 DIAGNOSIS — Z833 Family history of diabetes mellitus: Secondary | ICD-10-CM

## 2024-06-03 DIAGNOSIS — D84821 Immunodeficiency due to drugs: Secondary | ICD-10-CM | POA: Diagnosis present

## 2024-06-03 DIAGNOSIS — E119 Type 2 diabetes mellitus without complications: Secondary | ICD-10-CM

## 2024-06-03 DIAGNOSIS — N12 Tubulo-interstitial nephritis, not specified as acute or chronic: Secondary | ICD-10-CM | POA: Diagnosis present

## 2024-06-03 DIAGNOSIS — E1140 Type 2 diabetes mellitus with diabetic neuropathy, unspecified: Secondary | ICD-10-CM | POA: Diagnosis present

## 2024-06-03 DIAGNOSIS — Z91013 Allergy to seafood: Secondary | ICD-10-CM

## 2024-06-03 DIAGNOSIS — N179 Acute kidney failure, unspecified: Secondary | ICD-10-CM | POA: Diagnosis present

## 2024-06-03 DIAGNOSIS — Z89511 Acquired absence of right leg below knee: Secondary | ICD-10-CM

## 2024-06-03 DIAGNOSIS — I110 Hypertensive heart disease with heart failure: Secondary | ICD-10-CM | POA: Diagnosis present

## 2024-06-03 MED ORDER — LACTATED RINGERS IV BOLUS
1000.0000 mL | Freq: Once | INTRAVENOUS | Status: AC
  Administered 2024-06-03: 1000 mL via INTRAVENOUS

## 2024-06-03 NOTE — ED Triage Notes (Signed)
 Patient arrives via [facility] from home for abnormal labs. Patients doctor called her for elevated creatine and possibly potassium. Patient reports ongoing dizziness and feeling under the weather - recently seen here for same. EMS reports near syncopal episode when standing up from chair at home. Kidney transplant patient - 2014.   BP 134/72, P 110-120, RR 20, SPO2 97% 2L, etco2 13-29, CBG 222

## 2024-06-04 ENCOUNTER — Emergency Department (HOSPITAL_COMMUNITY)

## 2024-06-04 ENCOUNTER — Other Ambulatory Visit: Payer: Self-pay

## 2024-06-04 DIAGNOSIS — I5032 Chronic diastolic (congestive) heart failure: Secondary | ICD-10-CM | POA: Diagnosis present

## 2024-06-04 DIAGNOSIS — E1122 Type 2 diabetes mellitus with diabetic chronic kidney disease: Secondary | ICD-10-CM | POA: Diagnosis present

## 2024-06-04 DIAGNOSIS — Z8249 Family history of ischemic heart disease and other diseases of the circulatory system: Secondary | ICD-10-CM | POA: Diagnosis not present

## 2024-06-04 DIAGNOSIS — Z1152 Encounter for screening for COVID-19: Secondary | ICD-10-CM | POA: Diagnosis not present

## 2024-06-04 DIAGNOSIS — Z940 Kidney transplant status: Secondary | ICD-10-CM | POA: Diagnosis not present

## 2024-06-04 DIAGNOSIS — I739 Peripheral vascular disease, unspecified: Secondary | ICD-10-CM | POA: Diagnosis not present

## 2024-06-04 DIAGNOSIS — R55 Syncope and collapse: Secondary | ICD-10-CM | POA: Diagnosis not present

## 2024-06-04 DIAGNOSIS — E7800 Pure hypercholesterolemia, unspecified: Secondary | ICD-10-CM | POA: Diagnosis present

## 2024-06-04 DIAGNOSIS — E11319 Type 2 diabetes mellitus with unspecified diabetic retinopathy without macular edema: Secondary | ICD-10-CM | POA: Diagnosis present

## 2024-06-04 DIAGNOSIS — D84821 Immunodeficiency due to drugs: Secondary | ICD-10-CM | POA: Diagnosis present

## 2024-06-04 DIAGNOSIS — E861 Hypovolemia: Secondary | ICD-10-CM | POA: Diagnosis present

## 2024-06-04 DIAGNOSIS — I110 Hypertensive heart disease with heart failure: Secondary | ICD-10-CM | POA: Diagnosis present

## 2024-06-04 DIAGNOSIS — R112 Nausea with vomiting, unspecified: Secondary | ICD-10-CM | POA: Diagnosis not present

## 2024-06-04 DIAGNOSIS — Z89511 Acquired absence of right leg below knee: Secondary | ICD-10-CM | POA: Diagnosis not present

## 2024-06-04 DIAGNOSIS — T8619 Other complication of kidney transplant: Secondary | ICD-10-CM | POA: Diagnosis present

## 2024-06-04 DIAGNOSIS — Z79899 Other long term (current) drug therapy: Secondary | ICD-10-CM | POA: Diagnosis not present

## 2024-06-04 DIAGNOSIS — I43 Cardiomyopathy in diseases classified elsewhere: Secondary | ICD-10-CM | POA: Diagnosis present

## 2024-06-04 DIAGNOSIS — Z833 Family history of diabetes mellitus: Secondary | ICD-10-CM | POA: Diagnosis not present

## 2024-06-04 DIAGNOSIS — Z7902 Long term (current) use of antithrombotics/antiplatelets: Secondary | ICD-10-CM | POA: Diagnosis not present

## 2024-06-04 DIAGNOSIS — N12 Tubulo-interstitial nephritis, not specified as acute or chronic: Secondary | ICD-10-CM | POA: Diagnosis present

## 2024-06-04 DIAGNOSIS — E876 Hypokalemia: Secondary | ICD-10-CM | POA: Diagnosis present

## 2024-06-04 DIAGNOSIS — K219 Gastro-esophageal reflux disease without esophagitis: Secondary | ICD-10-CM | POA: Diagnosis present

## 2024-06-04 DIAGNOSIS — Y830 Surgical operation with transplant of whole organ as the cause of abnormal reaction of the patient, or of later complication, without mention of misadventure at the time of the procedure: Secondary | ICD-10-CM | POA: Diagnosis present

## 2024-06-04 DIAGNOSIS — E1140 Type 2 diabetes mellitus with diabetic neuropathy, unspecified: Secondary | ICD-10-CM | POA: Diagnosis present

## 2024-06-04 DIAGNOSIS — N179 Acute kidney failure, unspecified: Secondary | ICD-10-CM | POA: Diagnosis present

## 2024-06-04 DIAGNOSIS — Z794 Long term (current) use of insulin: Secondary | ICD-10-CM | POA: Diagnosis not present

## 2024-06-04 DIAGNOSIS — I951 Orthostatic hypotension: Secondary | ICD-10-CM | POA: Diagnosis present

## 2024-06-04 DIAGNOSIS — E1151 Type 2 diabetes mellitus with diabetic peripheral angiopathy without gangrene: Secondary | ICD-10-CM | POA: Diagnosis present

## 2024-06-04 LAB — CBC
HCT: 36.6 % (ref 36.0–46.0)
HCT: 39.2 % (ref 36.0–46.0)
Hemoglobin: 11.4 g/dL — ABNORMAL LOW (ref 12.0–15.0)
Hemoglobin: 12.4 g/dL (ref 12.0–15.0)
MCH: 24.7 pg — ABNORMAL LOW (ref 26.0–34.0)
MCH: 24.9 pg — ABNORMAL LOW (ref 26.0–34.0)
MCHC: 31.1 g/dL (ref 30.0–36.0)
MCHC: 31.6 g/dL (ref 30.0–36.0)
MCV: 77.9 fL — ABNORMAL LOW (ref 80.0–100.0)
MCV: 80.1 fL (ref 80.0–100.0)
Platelets: 213 K/uL (ref 150–400)
Platelets: 246 K/uL (ref 150–400)
RBC: 4.57 MIL/uL (ref 3.87–5.11)
RBC: 5.03 MIL/uL (ref 3.87–5.11)
RDW: 14.4 % (ref 11.5–15.5)
RDW: 14.5 % (ref 11.5–15.5)
WBC: 7.7 K/uL (ref 4.0–10.5)
WBC: 8.6 K/uL (ref 4.0–10.5)
nRBC: 0 % (ref 0.0–0.2)
nRBC: 0 % (ref 0.0–0.2)

## 2024-06-04 LAB — BASIC METABOLIC PANEL WITH GFR
Anion gap: 20 — ABNORMAL HIGH (ref 5–15)
Anion gap: 17 — ABNORMAL HIGH (ref 5–15)
Anion gap: 17 — ABNORMAL HIGH (ref 5–15)
BUN: 32 mg/dL — ABNORMAL HIGH (ref 6–20)
BUN: 27 mg/dL — ABNORMAL HIGH (ref 6–20)
BUN: 26 mg/dL — ABNORMAL HIGH (ref 6–20)
CO2: 26 mmol/L (ref 22–32)
CO2: 27 mmol/L (ref 22–32)
CO2: 26 mmol/L (ref 22–32)
Calcium: 8.8 mg/dL — ABNORMAL LOW (ref 8.9–10.3)
Calcium: 9.2 mg/dL (ref 8.9–10.3)
Calcium: 9 mg/dL (ref 8.9–10.3)
Chloride: 93 mmol/L — ABNORMAL LOW (ref 98–111)
Chloride: 95 mmol/L — ABNORMAL LOW (ref 98–111)
Chloride: 96 mmol/L — ABNORMAL LOW (ref 98–111)
Creatinine, Ser: 2.04 mg/dL — ABNORMAL HIGH (ref 0.44–1.00)
Creatinine, Ser: 1.66 mg/dL — ABNORMAL HIGH (ref 0.44–1.00)
Creatinine, Ser: 1.58 mg/dL — ABNORMAL HIGH (ref 0.44–1.00)
GFR, Estimated: 28 mL/min — ABNORMAL LOW
GFR, Estimated: 36 mL/min — ABNORMAL LOW
GFR, Estimated: 38 mL/min — ABNORMAL LOW
Glucose, Bld: 139 mg/dL — ABNORMAL HIGH (ref 70–99)
Glucose, Bld: 124 mg/dL — ABNORMAL HIGH (ref 70–99)
Glucose, Bld: 118 mg/dL — ABNORMAL HIGH (ref 70–99)
Potassium: 3 mmol/L — ABNORMAL LOW (ref 3.5–5.1)
Potassium: 2.8 mmol/L — ABNORMAL LOW (ref 3.5–5.1)
Potassium: 2.8 mmol/L — ABNORMAL LOW (ref 3.5–5.1)
Sodium: 139 mmol/L (ref 135–145)
Sodium: 139 mmol/L (ref 135–145)
Sodium: 140 mmol/L (ref 135–145)

## 2024-06-04 LAB — URINALYSIS, ROUTINE W REFLEX MICROSCOPIC
Bilirubin Urine: NEGATIVE
Glucose, UA: NEGATIVE mg/dL
Ketones, ur: NEGATIVE mg/dL
Leukocytes,Ua: NEGATIVE
Nitrite: NEGATIVE
Protein, ur: 100 mg/dL — AB
Specific Gravity, Urine: 1.02 (ref 1.005–1.030)
pH: 6 (ref 5.0–8.0)

## 2024-06-04 LAB — MAGNESIUM
Magnesium: 1.1 mg/dL — ABNORMAL LOW (ref 1.7–2.4)
Magnesium: 2.1 mg/dL (ref 1.7–2.4)
Magnesium: 2.5 mg/dL — ABNORMAL HIGH (ref 1.7–2.4)
Magnesium: 2.6 mg/dL — ABNORMAL HIGH (ref 1.7–2.4)

## 2024-06-04 LAB — COMPREHENSIVE METABOLIC PANEL WITH GFR
ALT: 8 U/L (ref 0–44)
AST: 25 U/L (ref 15–41)
Albumin: 4.2 g/dL (ref 3.5–5.0)
Alkaline Phosphatase: 71 U/L (ref 38–126)
Anion gap: 23 — ABNORMAL HIGH (ref 5–15)
BUN: 36 mg/dL — ABNORMAL HIGH (ref 6–20)
CO2: 28 mmol/L (ref 22–32)
Calcium: 9.4 mg/dL (ref 8.9–10.3)
Chloride: 89 mmol/L — ABNORMAL LOW (ref 98–111)
Creatinine, Ser: 2.48 mg/dL — ABNORMAL HIGH (ref 0.44–1.00)
GFR, Estimated: 22 mL/min — ABNORMAL LOW
Glucose, Bld: 208 mg/dL — ABNORMAL HIGH (ref 70–99)
Potassium: 2.7 mmol/L — CL (ref 3.5–5.1)
Sodium: 140 mmol/L (ref 135–145)
Total Bilirubin: 0.6 mg/dL (ref 0.0–1.2)
Total Protein: 7.2 g/dL (ref 6.5–8.1)

## 2024-06-04 LAB — URINALYSIS, MICROSCOPIC (REFLEX)

## 2024-06-04 LAB — RESP PANEL BY RT-PCR (RSV, FLU A&B, COVID)  RVPGX2
Influenza A by PCR: NEGATIVE
Influenza B by PCR: NEGATIVE
Resp Syncytial Virus by PCR: NEGATIVE
SARS Coronavirus 2 by RT PCR: NEGATIVE

## 2024-06-04 LAB — GLUCOSE, CAPILLARY: Glucose-Capillary: 122 mg/dL — ABNORMAL HIGH (ref 70–99)

## 2024-06-04 LAB — CBG MONITORING, ED
Glucose-Capillary: 176 mg/dL — ABNORMAL HIGH (ref 70–99)
Glucose-Capillary: 103 mg/dL — ABNORMAL HIGH (ref 70–99)

## 2024-06-04 MED ORDER — POTASSIUM CHLORIDE 10 MEQ/100ML IV SOLN
10.0000 meq | INTRAVENOUS | Status: AC
  Administered 2024-06-04 (×3): 10 meq via INTRAVENOUS
  Filled 2024-06-04 (×3): qty 100

## 2024-06-04 MED ORDER — METOPROLOL SUCCINATE ER 50 MG PO TB24
50.0000 mg | ORAL_TABLET | Freq: Every day | ORAL | Status: DC
  Administered 2024-06-04 – 2024-06-06 (×3): 50 mg via ORAL
  Filled 2024-06-04 (×3): qty 1

## 2024-06-04 MED ORDER — SODIUM CHLORIDE 0.9 % IV SOLN: INTRAVENOUS | Status: DC

## 2024-06-04 MED ORDER — TACROLIMUS 1 MG PO CAPS
2.0000 mg | ORAL_CAPSULE | Freq: Two times a day (BID) | ORAL | Status: DC
  Administered 2024-06-04 – 2024-06-07 (×7): 2 mg via ORAL
  Filled 2024-06-04 (×8): qty 2

## 2024-06-04 MED ORDER — INSULIN ASPART 100 UNIT/ML IJ SOLN
0.0000 [IU] | Freq: Every day | INTRAMUSCULAR | Status: DC
  Administered 2024-06-05: 2 [IU] via SUBCUTANEOUS
  Filled 2024-06-04: qty 2

## 2024-06-04 MED ORDER — METOPROLOL SUCCINATE ER 25 MG PO TB24: 25.0000 mg | ORAL_TABLET | Freq: Every day | ORAL | Status: DC

## 2024-06-04 MED ORDER — MAGNESIUM OXIDE -MG SUPPLEMENT 400 (240 MG) MG PO TABS
800.0000 mg | ORAL_TABLET | Freq: Once | ORAL | Status: DC
  Filled 2024-06-04: qty 2

## 2024-06-04 MED ORDER — SODIUM CHLORIDE 0.9 % IV SOLN
1.0000 g | INTRAVENOUS | Status: DC
  Administered 2024-06-04 – 2024-06-07 (×4): 1 g via INTRAVENOUS
  Filled 2024-06-04 (×4): qty 10

## 2024-06-04 MED ORDER — ROSUVASTATIN CALCIUM 20 MG PO TABS
20.0000 mg | ORAL_TABLET | Freq: Every day | ORAL | Status: AC
  Administered 2024-06-04 – 2024-06-06 (×3): 20 mg via ORAL
  Filled 2024-06-04 (×3): qty 1

## 2024-06-04 MED ORDER — LORAZEPAM 2 MG/ML IJ SOLN
0.5000 mg | INTRAMUSCULAR | Status: DC | PRN
  Administered 2024-06-04: 0.5 mg via INTRAVENOUS
  Filled 2024-06-04: qty 1

## 2024-06-04 MED ORDER — AMLODIPINE BESYLATE 5 MG PO TABS
5.0000 mg | ORAL_TABLET | Freq: Every day | ORAL | Status: DC
  Administered 2024-06-04 – 2024-06-06 (×3): 5 mg via ORAL
  Filled 2024-06-04 (×3): qty 1

## 2024-06-04 MED ORDER — POTASSIUM CHLORIDE CRYS ER 20 MEQ PO TBCR
40.0000 meq | EXTENDED_RELEASE_TABLET | Freq: Once | ORAL | Status: DC
  Filled 2024-06-04: qty 2

## 2024-06-04 MED ORDER — INSULIN ASPART 100 UNIT/ML IJ SOLN
0.0000 [IU] | Freq: Three times a day (TID) | INTRAMUSCULAR | Status: DC
  Administered 2024-06-04: 2 [IU] via SUBCUTANEOUS
  Administered 2024-06-05: 1 [IU] via SUBCUTANEOUS
  Administered 2024-06-06: 2 [IU] via SUBCUTANEOUS
  Administered 2024-06-06: 1 [IU] via SUBCUTANEOUS
  Administered 2024-06-06: 2 [IU] via SUBCUTANEOUS
  Administered 2024-06-07: 1 [IU] via SUBCUTANEOUS
  Filled 2024-06-04: qty 1
  Filled 2024-06-04 (×2): qty 2
  Filled 2024-06-04 (×2): qty 1

## 2024-06-04 MED ORDER — ACETAMINOPHEN 650 MG RE SUPP: 650.0000 mg | Freq: Four times a day (QID) | RECTAL | Status: DC | PRN

## 2024-06-04 MED ORDER — MAGNESIUM SULFATE 2 GM/50ML IV SOLN
2.0000 g | Freq: Once | INTRAVENOUS | Status: DC
  Filled 2024-06-04: qty 50

## 2024-06-04 MED ORDER — CLOPIDOGREL BISULFATE 75 MG PO TABS
75.0000 mg | ORAL_TABLET | Freq: Every day | ORAL | Status: AC
  Administered 2024-06-04 – 2024-06-06 (×3): 75 mg via ORAL
  Filled 2024-06-04 (×3): qty 1

## 2024-06-04 MED ORDER — HEPARIN SODIUM (PORCINE) 5000 UNIT/ML IJ SOLN
5000.0000 [IU] | Freq: Three times a day (TID) | INTRAMUSCULAR | Status: DC
  Administered 2024-06-04 – 2024-06-07 (×10): 5000 [IU] via SUBCUTANEOUS
  Filled 2024-06-04 (×10): qty 1

## 2024-06-04 MED ORDER — PANTOPRAZOLE SODIUM 40 MG PO TBEC
40.0000 mg | DELAYED_RELEASE_TABLET | Freq: Every day | ORAL | Status: DC
  Administered 2024-06-04 – 2024-06-06 (×3): 40 mg via ORAL
  Filled 2024-06-04 (×3): qty 1

## 2024-06-04 MED ORDER — MYCOPHENOLATE SODIUM 180 MG PO TBEC
360.0000 mg | DELAYED_RELEASE_TABLET | Freq: Two times a day (BID) | ORAL | Status: DC
  Administered 2024-06-04 – 2024-06-07 (×7): 360 mg via ORAL
  Filled 2024-06-04 (×7): qty 2

## 2024-06-04 MED ORDER — MAGNESIUM SULFATE 2 GM/50ML IV SOLN
2.0000 g | Freq: Once | INTRAVENOUS | Status: AC
  Administered 2024-06-04: 2 g via INTRAVENOUS
  Filled 2024-06-04: qty 50

## 2024-06-04 MED ORDER — ACETAMINOPHEN 325 MG PO TABS: 650.0000 mg | ORAL_TABLET | Freq: Four times a day (QID) | ORAL | Status: DC | PRN

## 2024-06-04 NOTE — ED Provider Notes (Signed)
 " [facility]PT [facility] Emergency Department Provider Note MRN:  [id number]  Arrival date & time: 06/04/2024     Chief Complaint   Abnormal Lab   History of Present Illness   [name] is a 53 y.o. year-old female with a history of ESRD status post kidney transplant, CHF, diabetes, PAD presenting to the ED with chief complaint of abnormal lab.  Patient was sent here at the request of her nephrologist, worsening renal function.  She explains she had an admission last month for repeated syncope and her electrolytes were abnormal.  She explains that she is having some nausea vomiting today which is abnormal for her.  Otherwise denies pain, no fever.  Review of Systems  A thorough review of systems was obtained and all systems are negative except as noted in the HPI and PMH.   Patient's Health History    Past Medical History:  Diagnosis Date   Anemia    Below knee amputation (HCC)    RIGHT   CHF (congestive heart failure) (HCC)    PT UNAWARE OF THIS DX   Chronic kidney disease    PAST HX OF ESRD- HAD KIDNEY TRANSPLANT 01-24-2013   Critical lower limb ischemia (HCC) 10/05/2015   Enlarged heart    WITH IRREGULAR HEART RATE   GERD (gastroesophageal reflux disease)    High cholesterol    History of blood transfusion    related to kidney transplant   Hypercholesteremia 08/14/2006   Qualifier: Diagnosis of  By: [name]     Hypertension    Kidney transplant recipient    01/24/2013   Mechanical complication of other vascular device, implant, and graft 09/22/2012   Metabolic bone disease    Orthostatic hypotension    PAD (peripheral artery disease)    PT UNAWARE OF THIS DIAGNOSIS   Pericardial effusion    Retinopathy    Type II diabetes mellitus (HCC)    controlled with diet    Past Surgical History:  Procedure Laterality Date   AMPUTATION Right 10/13/2015   Procedure: Right Transmetatarsal Amputation;  Surgeon: Jerona Harden GAILS, MD;  Location: [facility]  OR;  Service: Orthopedics;  Laterality: Right;   AMPUTATION Right 11/29/2015   Procedure: RIGHT BELOW KNEE AMPUTATION;  Surgeon: Jerona GAILS Harden, MD;  Location: [facility] OR;  Service: Orthopedics;  Laterality: Right;   AMPUTATION FINGER     Rt hand middle finger   AV FISTULA PLACEMENT  10/04/2011   Procedure: INSERTION OF ARTERIOVENOUS (AV) GORE-TEX GRAFT ARM;  Surgeon: Lonni GORMAN Blade, MD;  Location: [facility] OR;  Service: Vascular;  Laterality: Left;  Insertion left upper arm Arteriovenous goretex graft   AV FISTULA PLACEMENT  10/29/2011   Procedure: ARTERIOVENOUS (AV) FISTULA CREATION;  Surgeon: Lonni GORMAN Blade, MD;  Location: [facility] OR;  Service: Vascular;  Laterality: Right;  Creation Right Arteriovenous Fistula    AVGG REMOVAL  10/04/2011   Procedure: REMOVAL OF ARTERIOVENOUS GORETEX GRAFT (AVGG);  Surgeon: Carlin FORBES Haddock, MD;  Location: [facility] OR;  Service: Vascular;  Laterality: Left;   BELOW THE KNEE AMPUTATION Right    BLADDER SUSPENSION     03/08/2022   CHOLECYSTECTOMY N/A 12/08/2015   Procedure: LAPAROSCOPIC CHOLECYSTECTOMY WITH INTRAOPERATIVE CHOLANGIOGRAM;  Surgeon: Jina Nephew, MD;  Location[facility] OR;  Service: General;  Laterality: N/A;   ESOPHAGOGASTRODUODENOSCOPY N/A 12/24/2012   Procedure: ESOPHAGOGASTRODUODENOSCOPY (EGD);  Surgeon: Toribio SHAUNNA Cedar, MD;  Location: [facility] ENDOSCOPY;  Service: Endoscopy;  Laterality: N/A;   FINGER AMPUTATION Right 02/26/2013  3rd finger   INSERTION OF DIALYSIS CATHETER  10/04/2011   Procedure: INSERTION OF DIALYSIS CATHETER;  Surgeon: Lonni GORMAN Blade, MD;  Location: [facility] OR;  Service: Vascular;  Laterality: Right;  insertion of dialysis catheter right internal jugular   INSERTION OF DIALYSIS CATHETER  06/23/2012   Procedure: INSERTION OF DIALYSIS CATHETER;  Surgeon: Lonni GORMAN Blade, MD;  Location: [facility] OR;  Service: Vascular;  Laterality: N/A;  Ultrasound guided   KIDNEY TRANSPLANT  01/24/2013   LOWER EXTREMITY ANGIOGRAPHY Left 08/12/2016    Procedure: Lower Extremity Angiography;  Surgeon: Selinda GORMAN Gu, MD;  Location: [facility] INVASIVE CV LAB;  Service: Cardiovascular;  Laterality: Left;   LOWER EXTREMITY INTERVENTION  08/12/2016   Procedure: Lower Extremity Intervention;  Surgeon: Selinda GORMAN Gu, MD;  Location: [facility] INVASIVE CV LAB;  Service: Cardiovascular;;   NEPHRECTOMY TRANSPLANTED ORGAN     PATCH ANGIOPLASTY  06/23/2012   Procedure: PATCH ANGIOPLASTY;  Surgeon: Lonni GORMAN Blade, MD;  Location: [facility] OR;  Service: Vascular;  Laterality: Right;   PERIPHERAL VASCULAR CATHETERIZATION N/A 09/19/2015   Procedure: Lower Extremity Angiography;  Surgeon: Gordy Bergamo, MD;  Location: [facility] INVASIVE CV LAB;  Service: Cardiovascular;  Laterality: N/A;   PERIPHERAL VASCULAR CATHETERIZATION N/A 09/19/2015   Procedure: Abdominal Aortogram;  Surgeon: Gordy Bergamo, MD;  [facility] INVAS[facility] CV LAB;  Service: Cardiovascular;  Laterality: N/A;   PERIPHERAL VASCULAR CATHETERIZATION Right 09/19/2015   Procedure: Peripheral Vascular Intervention;  Surgeon: Gordy Bergamo, MD;  Location: [facility] INVASIVE CV LAB;  Service: Cardiovascular;  Laterality: Right;  Right Common  Iliac   PERIPHERAL VASCULAR CATHETERIZATION N/A 10/06/2015   Procedure: Lower Extremity Angiography;  Surgeon: Gordy Bergamo, MD;  Location: [facility] INVASIVE CV LAB;  Service: Cardiovascular;  Laterality: N/A;   PERIPHERAL VASCULAR CATHETERIZATION  10/06/2015   Procedure: Peripheral Vascular Intervention;  Surgeon: Gordy Bergamo, MD;  Location: [facility] INVASIVE CV LAB;  Service: Cardiovascular;;  REIA  Omnilink 6.0x29, innova 7x80  RSFA innova 5x80   REVISON OF ARTERIOVENOUS FISTULA  06/23/2012   Procedure: REVISON OF ARTERIOVENOUS FISTULA;  Surgeon: Lonni GORMAN Blade, MD;  Location: [facility] OR;  Service: Vascular;  Laterality: Right;  Ultrasound guided   SHUNTOGRAM N/A 04/06/2012   Procedure: RICH;  Surgeon: Lonni GORMAN Blade, MD;  Location: [facility] CATH LAB;  Service: Cardiovascular;  Laterality: N/A;    Family  History  Problem Relation Age of Onset   Malignant hyperthermia Mother    Thyroid  disease Mother    Hypertension Mother    Malignant hyperthermia Father    Hypertension Father    Diabetes Brother    Anesthesia problems Neg Hx    Colon cancer Neg Hx    Esophageal cancer Neg Hx    Colon polyps Neg Hx    Rectal cancer Neg Hx    Stomach cancer Neg Hx     Social History   Socioeconomic History   Marital status: Single    Spouse name: Not on file   Number of children: 0   Years of education: Not on file   Highest education level: Not on file  Occupational History   Not on file  Tobacco Use   Smoking status: Never   Smokeless tobacco: Never  Vaping Use   Vaping status: Never Used  Substance and Sexual Activity   Alcohol use: No   Drug use: No   Sexual activity: Not on file  Other Topics Concern   Not on file  Social History Narrative   Not on file  Social Drivers of Health   Tobacco Use: Low Risk (06/03/2024)   Patient History    Smoking Tobacco Use: Never    Smokeless Tobacco Use: Never    Passive Exposure: Not on file  Financial Resource Strain: Not on file  Food Insecurity: Patient Declined (05/11/2024)   Epic    Worried About Programme Researcher, Broadcasting/film/video in the Last Year: Patient declined    Barista in the Last Year: Patient declined  Transportation Needs: Patient Declined (05/11/2024)   Epic    Lack of Transportation (Medical): Patient declined    Lack of Transportation (Non-Medical): Patient declined  Physical Activity: Not on file  Stress: Not on file  Social Connections: Not on file  Intimate Partner Violence: Patient Declined (05/11/2024)   Epic    Fear of Current or Ex-Partner: Patient declined    Emotionally Abused: Patient declined    Physically Abused: Patient declined    Sexually Abused: Patient declined  Depression (PHQ2-9): Not on file  Alcohol Screen: Not on file  Housing: Unknown (05/11/2024)   Epic    Unable to Pay for Housing in the  Last Year: Patient declined    Number of Times Moved in the Last Year: 0    Homeless in the Last Year: Patient declined  Utilities: Patient Declined (05/11/2024)   Epic    Threatened with loss of utilities: Patient declined  Health Literacy: Not on file     Physical Exam   Vitals:   06/03/24 2330  BP: (!) 150/110  Pulse: (!) 114  Resp: (!) 21  Temp: 98 F (36.7 C)  SpO2: 99%    CONSTITUTIONAL: Chronically ill-appearing, NAD NEURO/PSYCH:  Alert and oriented x 3, no focal deficits EYES:  eyes equal and reactive ENT/NECK:  no LAD, no JVD CARDIO: Tachycardic rate, well-perfused, normal S1 and S2 PULM:  CTAB no wheezing or rhonchi GI/GU:  non-distended, mild left upper quadrant tenderness to palpation MSK/SPINE:  No gross deformities, no edema SKIN:  no rash, atraumatic   *Additional and/or pertinent findings included in MDM below  Diagnostic and Interventional Summary    EKG Interpretation Date/Time:  Thursday June 03 2024 23:29:19 EST Ventricular Rate:  115 PR Interval:  131 QRS Duration:  84 QT Interval:  377 QTC Calculation: 522 R Axis:   26  Text Interpretation: Sinus tachycardia Consider left ventricular hypertrophy Prolonged QT interval Artifact in lead(s) I II aVR Confirmed by Theadore Sharper [phone]) on 06/04/2024 1:43:16 AM       Labs Reviewed  CBC - Abnormal; Notable for the following components:      Result Value   MCV 77.9 (*)    MCH 24.7 (*)    All other components within normal limits  COMPREHENSIVE METABOLIC PANEL WITH GFR - Abnormal; Notable for the following components:   Potassium 2.7 (*)    Chloride 89 (*)    Glucose, Bld 208 (*)    BUN 36 (*)    Creatinine, Ser 2.48 (*)    GFR, Estimated 22 (*)    Anion gap 23 (*)    All other components within normal limits  MAGNESIUM  - Abnormal; Notable for the following components:   Magnesium  1.1 (*)    All other components within normal limits  RESP PANEL BY RT-PCR (RSV, FLU A&B, COVID)  [id number]   URINALYSIS, ROUTINE W REFLEX MICROSCOPIC    CT ABDOMEN PELVIS WO CONTRAST  Final Result    DG Chest Port 1 View  Final Result  Medications  potassium chloride  10 mEq in 100 mL IVPB (10 mEq Intravenous New Bag/Given 06/04/24 0059)  magnesium  sulfate IVPB 2 g 50 mL (2 g Intravenous New Bag/Given 06/04/24 0058)  lactated ringers  bolus 1,000 mL (1,000 mLs Intravenous New Bag/Given 06/03/24 2354)  potassium chloride  SA (KLOR-CON  M) CR tablet 40 mEq (40 mEq Oral Given 06/04/24 0059)  magnesium  oxide (MAG-OX) tablet 800 mg (800 mg Oral Given 06/04/24 0059)     Procedures  /  Critical Care .Critical Care  Performed by: Theadore Ozell HERO, MD Authorized by: Theadore Ozell HERO, MD   Critical care provider statement:    Critical care time (minutes):  35   Critical care was necessary to treat or prevent imminent or life-threatening deterioration of the following conditions:  Metabolic crisis   Critical care was time spent personally by me on the following activities:  Development of treatment plan with patient or surrogate, discussions with consultants, evaluation of patient's response to treatment, examination of patient, ordering and review of laboratory studies, ordering and review of radiographic studies, ordering and performing treatments and interventions, pulse oximetry, re-evaluation of patient's condition and review of old charts   ED Course and Medical Decision Making  Initial Impression and Ddx Differential diagnosis includes electrolyte disturbance, acute kidney injury.  Patient explains that she was dehydrated during her last admission and that was causing her near syncopal events.  Apart from prerenal cause of AKI would also consider rejection.  Patient does not have tenderness to her transplanted kidney on the right but she does have left lower quadrant tenderness with some nausea vomiting today, differential diagnosis also includes diverticulitis  Past medical/surgical history  that increases complexity of ED encounter: ESRD, kidney transplant  Interpretation of Diagnostics I personally reviewed the EKG and my interpretation is as follows: Sinus tachycardia  Labs reveal hypomagnesemia, hypokalemia, acute kidney injury  Patient Reassessment and Ultimate Disposition/Management     Plan is for hospitalist admission.  Patient management required discussion with the following services or consulting groups:  Hospitalist Service  Complexity of Problems Addressed Acute illness or injury that poses threat of life of bodily function  Additional Data Reviewed and Analyzed Further history obtained from: Recent discharge summary and Prior labs/imaging results  Additional Factors Impacting ED Encounter Risk Consideration of hospitalization  Ozell HERO. Theadore, MD [facility] Emergency Medicine [facility] [email or web address]  Final Clinical Impressions(s) / ED Diagnoses     ICD-10-CM   1. Nausea and vomiting, unspecified vomiting type  R11.2     2. Near syncope  R55     3. Acute kidney injury  N17.9     4. Hypomagnesemia  E83.42     5. Hypokalemia  E87.6     6. Kidney transplant status  Z94.0       ED Discharge Orders     None        Discharge Instructions Discussed with and Provided to Patient:   Discharge Instructions   None      Theadore Ozell HERO, MD 06/04/24 0144  "

## 2024-06-04 NOTE — Progress Notes (Addendum)
 "          [facility]                                                                              [name], is a 53 y.o. female, DOB - 1971/02/15, FMW:[id number] Admit date - 06/03/2024    Outpatient Primary MD for the patient is [name], [name], MD  LOS - 0  days  Chief Complaint  Patient presents with   Abnormal Lab       Brief summary   Patient is a 53 year old female with ESRD status post renal transplant in 2014., HTN, chronic orthostatic hypotension, recurrent UTIs, sinusitis, IDDM, hyperlipidemia, diabetic neuropathy, retinopathy, severe PAD, right BKA, presented to ED due to abnormal labs.  Patient's PCP had called her for elevated creatinine and low potassium.  Patient reported ongoing dizziness, near syncopal episode when standing up from chair at home. In ED patient noted to have sodium 40, potassium 2.7, chloride 89, BUN 36, creatinine 2.48, magnesium  1.1, CBC unremarkable  CT abdomen pelvis showed no acute findings   Assessment & Plan       AKI (acute kidney injury) - Likely due to hypovolemia, during encounter patient noted to have intractable nausea and vomiting.  Creatinine 2.48 on admission.  Baseline creatinine 1.3-1.6 - Hold Mounjaro, Lasix  (outpatient as needed), avoid nephrotoxic meds - Continue IV fluid hydration, - CT abdomen pelvis showed no acute findings.  Per admitting physician, nephrology consulted.   Intractable nausea and vomiting, left-sided flank pain, UTI with?  Pyelonephritis -No acute fevers or abdominal pain.  During encounter, patient noted to have intractable nausea and vomiting -CT abdomen did not show any SBO or acute infectious intra-abdominal pathology -Hold Mounjaro - Diet changed to clears, continue IV fluid hydration, - UA positive for UTI,?  Pyelonephritis, follow urine culture and sensitivities - Continue IV Rocephin   Severe hypokalemia, hypomagnesemia - Likely due to intractable nausea and vomiting, -  Replaced  ESRD with history of renal transplant -Obtain tacrolimus  level. - Creatinine improving, nephrology consulted. - Continue mycophenolate , tacrolimus     Essential hypertension -BP elevated with tachycardia, increase Toprol -XL to 50 mg daily, continue amlodipine     Insulin  dependent type 2 diabetes mellitus (HCC) -Hemoglobin A1c 8.3 on 05/10/2024 -Patient has been taking Mounjaro, hold inpatient - Placed on sliding scale insulin    Severe PAD S/P BKA (below knee amputation) unilateral, right (HCC) - Continue Crestor , Plavix   History of chronic diastolic CHF - Monitor volume status.  GERD - Continue PPI  Estimated body mass index is 27.73 kg/m as calculated from the following:   Height as of this encounter: 5' (1.524 m).   Weight as of this encounter: 64.4 kg.  Code Status: Full code DVT Prophylaxis:  heparin  injection 5,000 Units Start: 06/04/24 0600   Level of Care: Level of care: Progressive Family Communication: Updated patient Disposition Plan:      Remains inpatient appropriate:      Procedures:    Consultants:   Nephrology  Antimicrobials:   Anti-infectives (From admission, onward)    Start     Dose/Rate Route Frequency Ordered Stop   06/04/24 0600  cefTRIAXone  (ROCEPHIN ) 1 g in sodium chloride  0.9 %  100 mL IVPB        1 g 200 mL/hr over 30 Minutes Intravenous Every 24 hours 06/04/24 0519            Medications  amLODipine   5 mg Oral QHS   clopidogrel   75 mg Oral QHS   heparin   5,000 Units Subcutaneous Q8H   magnesium  oxide  800 mg Oral Once   metoprolol  succinate  25 mg Oral QHS   mycophenolate   360 mg Oral BID   pantoprazole   40 mg Oral QHS   potassium chloride   40 mEq Oral Once   rosuvastatin   20 mg Oral QHS   tacrolimus   2 mg Oral BID      Subjective:   [name] was seen and examined today.  Having intractable nausea and vomiting during encounter.  Left-sided flank abdominal pain.  Patient denies dizziness, chest pain,  shortness of breath.  No fevers.    Objective:   Vitals:   06/04/24 0600 06/04/24 0700 06/04/24 0730 06/04/24 0822  BP: (!) 162/92 (!) 163/74 (!) 191/72 (!) 161/65  Pulse: (!) 108 (!) 104 (!) 109 (!) 105  Resp: 18 18 16 20   Temp:   98 F (36.7 C) 97.8 F (36.6 C)  TempSrc:   Oral Oral  SpO2: 98% 96% 97% 98%  Weight:      Height:        Intake/Output Summary (Last 24 hours) at 06/04/2024 9062 Last data filed at 06/04/2024 0505 Gross per 24 hour  Intake 1250 ml  Output --  Net 1250 ml     Wt Readings from Last 3 Encounters:  06/03/24 64.4 kg  05/20/24 68.1 kg  05/11/24 67.9 kg     Exam General: Alert and oriented x 3, NAD Cardiovascular: S1 S2 auscultated,  RRR Respiratory: Clear to auscultation bilaterally, no wheezing Gastrointestinal: Soft, Left sided mild TTP, ND, no clear CVAT Ext: no pedal edema bilaterally Neuro: Right BKA Psych: Normal affect     Data Reviewed:  I have personally reviewed following labs    CBC Lab Results  Component Value Date   WBC 8.6 06/04/2024   RBC 4.57 06/04/2024   HGB 11.4 (L) 06/04/2024   HCT 36.6 06/04/2024   MCV 80.1 06/04/2024   MCH 24.9 (L) 06/04/2024   PLT 213 06/04/2024   MCHC 31.1 06/04/2024   RDW 14.4 06/04/2024   LYMPHSABS 1.2 05/10/2024   MONOABS 0.7 05/10/2024   EOSABS 0.0 05/10/2024   BASOSABS 0.0 05/10/2024     Last metabolic panel Lab Results  Component Value Date   NA 139 06/04/2024   K 3.0 (L) 06/04/2024   CL 93 (L) 06/04/2024   CO2 26 06/04/2024   BUN 32 (H) 06/04/2024   CREATININE 2.04 (H) 06/04/2024   GLUCOSE 139 (H) 06/04/2024   GFRNONAA 28 (L) 06/04/2024   GFRAA >60 07/28/2019   CALCIUM  8.8 (L) 06/04/2024   PHOS 3.6 12/08/2015   PROT 7.2 06/03/2024   ALBUMIN  4.2 06/03/2024   BILITOT 0.6 06/03/2024   ALKPHOS 71 06/03/2024   AST 25 06/03/2024   ALT 8 06/03/2024   ANIONGAP 20 (H) 06/04/2024    CBG (last 3)  No results for input(s): GLUCAP in the last 72 hours.     Coagulation Profile: No results for input(s): INR, PROTIME in the last 168 hours.   Radiology Studies: I have personally reviewed the imaging studies  CT ABDOMEN PELVIS WO CONTRAST Result Date: 06/04/2024 EXAM: CT ABDOMEN AND PELVIS WITHOUT CONTRAST 06/04/2024  12:30:46 AM TECHNIQUE: CT of the abdomen and pelvis was performed without the administration of intravenous contrast. Multiplanar reformatted images are provided for review. Automated exposure control, iterative reconstruction, and/or weight-based adjustment of the mA/kV was utilized to reduce the radiation dose to as low as reasonably achievable. COMPARISON: None available. CLINICAL HISTORY: Abdominal pain, acute, nonlocalized. FINDINGS: LOWER CHEST: Small pericardial effusion, similar to the number 245. Moderate multivessel coronary artery calcifications. LIVER: The liver is unremarkable. GALLBLADDER AND BILE DUCTS: Status post cholecystectomy. No biliary ductal dilatation. SPLEEN: No acute abnormality. PANCREAS: No acute abnormality. ADRENAL GLANDS: No acute abnormality. KIDNEYS, URETERS AND BLADDER: The native kidneys are markedly atrophic. Right iliac fossa transplant kidney noted. Simple cortical cyst noted involving the transplant kidney. Per consensus, no follow-up is needed for simple Bosniak type 1 and 2 renal cysts, unless the patient has a malignancy history or risk factors. No hydronephrosis. No intrarenal or ureteral calculi. The bladder is unremarkable. GI AND BOWEL: Stomach demonstrates no acute abnormality. Appendix normal. There is no bowel obstruction. PERITONEUM AND RETROPERITONEUM: No ascites. No free air. VASCULATURE: Aorta is normal in caliber. Moderate aortoiliac atherosclerotic calcification. Right common and external iliac artery stenting has been performed status post proximal and distal to the transplant renal artery anastomosis. Patency is not well assessed on this noncontrast examination. LYMPH NODES: No  lymphadenopathy. REPRODUCTIVE ORGANS: No acute abnormality. BONES AND SOFT TISSUES: No acute osseous abnormality. Tiny fat-containing umbilical hernia. IMPRESSION: 1. No acute findings in the abdomen or pelvis. 2. Status post renal transplantation with unremarkable appearance of right iliac fossa transplant kidney. 3. Small pericardial effusion, similar to prior. 4. Moderate multivessel coronary artery calcifications. Electronically signed by: Dorethia Molt MD 06/04/2024 12:48 AM EST RP Workstation: [id number]   DG Chest Port 1 View Result Date: 06/04/2024 EXAM: 1 VIEW(S) XRAY OF THE CHEST 06/04/2024 12:07:00 AM COMPARISON: 05/10/2024 CLINICAL HISTORY: dizzy FINDINGS: LUNGS AND PLEURA: No focal pulmonary opacity. No pleural effusion. No pneumothorax. HEART AND MEDIASTINUM: No acute abnormality of the cardiac and mediastinal silhouettes. BONES AND SOFT TISSUES: Right upper quadrant surgical clips noted. Stable left breast calcifications are noted. No acute osseous abnormality. IMPRESSION: 1. No acute findings. Electronically signed by: Oneil Devonshire MD 06/04/2024 12:23 AM EST RP Workstation: [id number] Nydia Distance M.D. [facility] 06/04/2024, 9:37 AM  Available via Epic secure chat 7am-7pm After 7 pm, please refer to night coverage provider listed on amion.    "

## 2024-06-04 NOTE — Progress Notes (Signed)
 [facility] night cross cover note:   I was notified by the patient's RN that the patient is complaining of nausea.  Does not currently have any prn antiemetics ordered.  In the setting of most recent EKG showing evidence of QTc prolongation with Qtc 505 ms, I subsequently ordered as needed IV Ativan  for nausea.    Eva Pore, DO Hospitalist

## 2024-06-04 NOTE — H&P (Signed)
 " History and Physical    [name] DOB: 03/31/1971 DOA: 06/03/2024  PCP: Catalina Bare, MD  Patient coming from: home  I have personally briefly reviewed patient's old medical records in [facility]  Chief Complaint: abnormal lab  HPI: [name] is a 53 y.o. female with medical history significant recurrent syncope of ESRD status post kidney transplant, CHF, DMII -diet controlled, PAD s/p RBKA ,GERD, HLD, HTN,chronic orthostatic hypotension, history of recurrent UTI/sinusitis/URI, who presents to ED BIB EMS from home due to abnormal labs noted  on out patient labs ordered by pcp. Labs where notable for elevated creatinine. Patient notes she has note nausea and low appetite recently which is not typical for her. She notes no fever/chills/ abdominal pain or dysuria.    ED Course:   Vitals BP 150/110, P 110-120, RR 20, SPO2 97% 2L, etco2 13-29, CBG 222   Na 140 K 2.7, , cl 89, bicarb 28, glu 208, cr 2.48 ( 1.3) , GFR 22 ag 23 Mag 1.1 RVP- negative EKG:nsr , LVH, prolonged QT 522 CXR: NAD  Tx mag 2gram  KCL UA -many bacteria  wbc 11-20    Review of Systems: As per HPI otherwise 10 point review of systems negative.   Past Medical History:  Diagnosis Date   Anemia    Below knee amputation (HCC)    RIGHT   CHF (congestive heart failure) (HCC)    PT UNAWARE OF THIS DX   Chronic kidney disease    PAST HX OF ESRD- HAD KIDNEY TRANSPLANT 01-24-2013   Critical lower limb ischemia (HCC) 10/05/2015   Enlarged heart    WITH IRREGULAR HEART RATE   GERD (gastroesophageal reflux disease)    High cholesterol    History of blood transfusion    related to kidney transplant   Hypercholesteremia 08/14/2006   Qualifier: Diagnosis of  By: [name]     Hypertension    Kidney transplant recipient    01/24/2013   Mechanical complication of other vascular device, implant, and graft 09/22/2012   Metabolic bone disease    Orthostatic  hypotension    PAD (peripheral artery disease)    PT UNAWARE OF THIS DIAGNOSIS   Pericardial effusion    Retinopathy    Type II diabetes mellitus (HCC)    controlled with diet    Past Surgical History:  Procedure Laterality Date   AMPUTATION Right 10/13/2015   Procedure: Right Transmetatarsal Amputation;  Surgeon: Jerona Harden GAILS, MD;  Location: [facility] OR;  Service: Orthopedics;  Laterality: Right;   AMPUTATION Right 11/29/2015   Procedure: RIGHT BELOW KNEE AMPUTATION;  Surgeon: Jerona GAILS Harden, MD;  Location: [facility] OR;  Service: Orthopedics;  Laterality: Right;   AMPUTATION FINGER     Rt hand middle finger   AV FISTULA PLACEMENT  10/04/2011   Procedure: INSERTION OF ARTERIOVENOUS (AV) GORE-TEX GRAFT ARM;  Surgeon: Lonni GORMAN Blade, MD;  Location: [facility] OR;  Service: Vascular;  Laterality: Left;  Insertion left upper arm Arteriovenous goretex graft   AV FISTULA PLACEMENT  10/29/2011   Procedure: ARTERIOVENOUS (AV) FISTULA CREATION;  Surgeon: Lonni GORMAN Blade, MD;  Location: [facility] OR;  Service: Vascular;  Laterality: Right;  Creation Right Arteriovenous Fistula    AVGG REMOVAL  10/04/2011   Procedure: REMOVAL OF ARTERIOVENOUS GORETEX GRAFT (AVGG);  Surgeon: Carlin FORBES Haddock, MD;  Location: [facility] OR;  Service: Vascular;  Laterality: Left;   BELOW THE KNEE AMPUTATION Right    BLADDER SUSPENSION  03/08/2022   CHOLECYSTECTOMY N/A 12/08/2015   Procedure: LAPAROSCOPIC CHOLECYSTECTOMY WITH INTRAOPERATIVE CHOLANGIOGRAM;  Surgeon: Jina Nephew, MD;  Location: [facility] OR;  Service: General;  Laterality: N/A;   ESOPHAGOGASTRODUODENOSCOPY N/A 12/24/2012   Procedure: ESOPHAGOGASTRODUODENOSCOPY (EGD);  Surgeon: Toribio SHAUNNA Cedar, MD;  Location: [facility] ENDOSCOPY;  Service: Endoscopy;  Laterality: N/A;   FINGER AMPUTATION Right 02/26/2013   3rd finger   INSERTION OF DIALYSIS CATHETER  10/04/2011   Procedure: INSERTION OF DIALYSIS CATHETER;  Surgeon: Lonni GORMAN Blade, MD;  Location: [facility] OR;  Service: Vascular;   Laterality: Right;  insertion of dialysis catheter right internal jugular   INSERTION OF DIALYSIS CATHETER  06/23/2012   Procedure: INSERTION OF DIALYSIS CATHETER;  Surgeon: Lonni GORMAN Blade, MD;  Location: [facility] OR;  Service: Vascular;  Laterality: N/A;  Ultrasound guided   KIDNEY TRANSPLANT  01/24/2013   LOWER EXTREMITY ANGIOGRAPHY Left 08/12/2016   Procedure: Lower Extremity Angiography;  Surgeon: Selinda GORMAN Gu, MD;  Location: [facility] INVASIVE CV LAB;  Service: Cardiovascular;  Laterality: Left;   LOWER EXTREMITY INTERVENTION  08/12/2016   Procedure: Lower Extremity Intervention;  Surgeon: Selinda GORMAN Gu, MD;  Location: [facility] INVASIVE CV LAB;  Service: Cardiovascular;;   NEPHRECTOMY TRANSPLANTED ORGAN     PATCH ANGIOPLASTY  06/23/2012   Procedure: PATCH ANGIOPLASTY;  Surgeon: Lonni GORMAN Blade, MD;  Location: [facility] OR;  Service: Vascular;  Laterality: Right;   PERIPHERAL VASCULAR CATHETERIZATION N/A 09/19/2015   Procedure: Lower Extremity Angiography;  Surgeon: Gordy Bergamo, MD;  Location: [facility] INVASIVE CV LAB;  Service: Cardiovascular;  Laterality: N/A;   PERIPHERAL VASCULAR CATHETERIZATION N/A 09/19/2015   Procedure: Abdominal Aortogram;  Surgeon: Gordy Bergamo, MD;  Location: [facility] INVASIVE CV LAB;  Service: Cardiovascular;  Laterality: N/A;   PERIPHERAL VASCULAR CATHETERIZATION Right 09/19/2015   Procedure: Peripheral Vascular Intervention;  Surgeon: Gordy Bergamo, MD;  Location: [facility] INVASIVE CV LAB;  Service: Cardiovascular;  Laterality: Right;  Right Common  Iliac   PERIPHERAL VASCULAR CATHETERIZATION N/A 10/06/2015   Procedure: Lower Extremity Angiography;  Surgeon: Gordy Bergamo, MD;  Location: [facility] INVASIVE CV LAB;  Service: Cardiovascular;  Laterality: N/A;   PERIPHERAL VASCULAR CATHETERIZATION  10/06/2015   Procedure: Peripheral Vascular Intervention;  Surgeon: Gordy Bergamo, MD;  Location: [facility] INVASIVE CV LAB;  Service: Cardiovascular;;  REIA  Omnilink 6.0x29, innova 7x80  RSFA innova 5x80   REVISON OF  ARTERIOVENOUS FISTULA  06/23/2012   Procedure: REVISON OF ARTERIOVENOUS FISTULA;  Surgeon: Lonni GORMAN Blade, MD;  Location: [facility] OR;  Service: Vascular;  Laterality: Right;  Ultrasound guided   SHUNTOGRAM N/A 04/06/2012   Procedure: RICH;  Surgeon: Lonni GORMAN Blade, MD;  Location: [facility] CATH LAB;  Service: Cardiovascular;  Laterality: N/A;     reports that she has never smoked. She has never used smokeless tobacco. She reports that she does not drink alcohol and does not use drugs.  Allergies[1]  Family History  Problem Relation Age of Onset   Malignant hyperthermia Mother    Thyroid  disease Mother    Hypertension Mother    Malignant hyperthermia Father    Hypertension Father    Diabetes Brother    Anesthesia problems Neg Hx    Colon cancer Neg Hx    Esophageal cancer Neg Hx    Colon polyps Neg Hx    Rectal cancer Neg Hx    Stomach cancer Neg Hx     Prior to Admission medications  Medication Sig Start Date End Date Taking? Authorizing Provider  amLODipine  (NORVASC ) 5 MG tablet Take  1 tablet (5 mg total) by mouth at bedtime. 05/20/24  Yes [name], MD  clopidogrel  (PLAVIX ) 75 MG tablet Take 1 tablet (75 mg total) by mouth daily. Patient taking differently: Take 75 mg by mouth at bedtime. 12/11/23  Yes [name], MD  furosemide  (LASIX ) 40 MG tablet Take 1 tablet (40 mg total) by mouth daily as needed for edema or fluid. 05/13/24  Yes Arrien, Mauricio Daniel, MD  HUMALOG KWIKPEN 100 UNIT/ML KwikPen Inject 0-5 Units into the skin 3 (three) times daily with meals as needed (DMII). 02/19/24  Yes [provider]  LANTUS  SOLOSTAR 100 UNIT/ML Solostar Pen Inject 10 Units into the skin 2 (two) times daily. 04/30/24  Yes [provider]  Melatonin 10 MG TABS Take 20 mg by mouth at bedtime.   Yes [provider]  methenamine (HIPREX) 1 g tablet Take 1 g by mouth 2 (two) times daily with a meal.   Yes [provider]  metoprolol  succinate  (TOPROL -XL) 25 MG 24 hr tablet Take 1 tablet (25 mg total) by mouth at bedtime. 05/20/24  Yes [name], MD  mycophenolate  (MYFORTIC ) 180 MG EC tablet Take 360 mg by mouth in the morning and at bedtime. (360mg ) in the morning and bedtime-( capsules )   Yes [provider]  pantoprazole  (PROTONIX ) 40 MG tablet Take 40 mg by mouth at bedtime.    Yes [provider]  promethazine  (PHENERGAN ) 12.5 MG tablet Take 12.5 mg by mouth every 8 (eight) hours as needed for nausea. 06/02/24  Yes [provider]  rosuvastatin  (CRESTOR ) 20 MG tablet Take 1 tablet (20 mg total) by mouth daily. Patient taking differently: Take 20 mg by mouth at bedtime. 12/11/23  Yes [name], MD  tacrolimus  (PROGRAF ) 1 MG capsule Take 2 mg by mouth in the morning and at bedtime.   Yes [provider]  tirzepatide CLOYDE) 10 MG/0.5ML Pen Inject 10 mg into the skin every Wednesday.   Yes [provider]    Physical Exam: Vitals:   06/03/24 2330 06/03/24 2331 06/04/24 0130 06/04/24 0200  BP: (!) 150/110  (!) 159/67 (!) 158/70  Pulse: (!) 114  (!) 107 (!) 105  Resp: (!) 21  20 16   Temp: 98 F (36.7 C)     TempSrc: Oral     SpO2: 99%  95% 96%  Weight:  64.4 kg    Height:  5' (1.524 m)      Constitutional: NAD, calm, comfortable Vitals:   06/03/24 2330 06/03/24 2331 06/04/24 0130 06/04/24 0200  BP: (!) 150/110  (!) 159/67 (!) 158/70  Pulse: (!) 114  (!) 107 (!) 105  Resp: (!) 21  20 16   Temp: 98 F (36.7 C)     TempSrc: Oral     SpO2: 99%  95% 96%  Weight:  64.4 kg    Height:  5' (1.524 m)     Eyes: PERRL, lids and conjunctivae normal ENMT: Mucous membranes are moist. Posterior pharynx clear of any exudate or lesions.Normal dentition.  Neck: normal, supple, no masses, no thyromegaly Respiratory: clear to auscultation bilaterally, no wheezing, no crackles. Normal respiratory effort. No accessory muscle use.  Cardiovascular: Regular rate and rhythm, no murmurs / rubs /  gallops. No extremity edema. 2+ pedal pulses.  Abdomen: no tenderness, no masses palpated. No hepatosplenomegaly. Bowel sounds positive.  Musculoskeletal: no clubbing / cyanosis. No joint deformity upper and lower extremities except Right bka, and amputation of finger on right had. Good  ROM, no contractures. Normal muscle tone.  Skin: no rashes, lesions, ulcers. No induration Neurologic: CN 2-12 grossly intact. Sensation intact, Strength 5/5 in all 4.  Psychiatric: Normal judgment and insight. Alert and oriented x 3. Normal mood.    Labs on Admission: I have personally reviewed following labs and imaging studies  CBC: Recent Labs  Lab 06/03/24 2342  WBC 7.7  HGB 12.4  HCT 39.2  MCV 77.9*  PLT 246   Basic Metabolic Panel: Recent Labs  Lab 06/03/24 2342  NA 140  K 2.7*  CL 89*  CO2 28  GLUCOSE 208*  BUN 36*  CREATININE 2.48*  CALCIUM  9.4  MG 1.1*   GFR: Estimated Creatinine Clearance: 21.7 mL/min (A) (by C-G formula based on SCr of 2.48 mg/dL (H)). Liver Function Tests: Recent Labs  Lab 06/03/24 2342  AST 25  ALT 8  ALKPHOS 71  BILITOT 0.6  PROT 7.2  ALBUMIN  4.2   No results for input(s): LIPASE, AMYLASE in the last 168 hours. No results for input(s): AMMONIA in the last 168 hours. Coagulation Profile: No results for input(s): INR, PROTIME in the last 168 hours. Cardiac Enzymes: No results for input(s): CKTOTAL, CKMB, CKMBINDEX, TROPONINI in the last 168 hours. BNP (last 3 results) No results for input(s): PROBNP in the last 8760 hours. HbA1C: No results for input(s): HGBA1C in the last 72 hours. CBG: No results for input(s): GLUCAP in the last 168 hours. Lipid Profile: No results for input(s): CHOL, HDL, LDLCALC, TRIG, CHOLHDL, LDLDIRECT in the last 72 hours. Thyroid  Function Tests: No results for input(s): TSH, T4TOTAL, FREET4, T3FREE, THYROIDAB in the last 72 hours. Anemia Panel: No results for input(s):  VITAMINB12, FOLATE, FERRITIN, TIBC, IRON, RETICCTPCT in the last 72 hours. Urine analysis:    Component Value Date/Time   COLORURINE YELLOW 05/10/2024 1715   APPEARANCEUR HAZY (A) 05/10/2024 1715   LABSPEC 1.008 05/10/2024 1715   PHURINE 6.0 05/10/2024 1715   GLUCOSEU NEGATIVE 05/10/2024 1715   HGBUR SMALL (A) 05/10/2024 1715   BILIRUBINUR NEGATIVE 05/10/2024 1715   KETONESUR NEGATIVE 05/10/2024 1715   PROTEINUR 100 (A) 05/10/2024 1715   UROBILINOGEN 0.2 07/21/2022 1559   NITRITE NEGATIVE 05/10/2024 1715   LEUKOCYTESUR NEGATIVE 05/10/2024 1715    Radiological Exams on Admission: CT ABDOMEN PELVIS WO CONTRAST Result Date: 06/04/2024 EXAM: CT ABDOMEN AND PELVIS WITHOUT CONTRAST 06/04/2024 12:30:46 AM TECHNIQUE: CT of the abdomen and pelvis was performed without the administration of intravenous contrast. Multiplanar reformatted images are provided for review. Automated exposure control, iterative reconstruction, and/or weight-based adjustment of the mA/kV was utilized to reduce the radiation dose to as low as reasonably achievable. COMPARISON: None available. CLINICAL HISTORY: Abdominal pain, acute, nonlocalized. FINDINGS: LOWER CHEST: Small pericardial effusion, similar to the number 245. Moderate multivessel coronary artery calcifications. LIVER: The liver is unremarkable. GALLBLADDER AND BILE DUCTS: Status post cholecystectomy. No biliary ductal dilatation. SPLEEN: No acute abnormality. PANCREAS: No acute abnormality. ADRENAL GLANDS: No acute abnormality. KIDNEYS, URETERS AND BLADDER: The native kidneys are markedly atrophic. Right iliac fossa transplant kidney noted. Simple cortical cyst noted involving the transplant kidney. Per consensus, no follow-up is needed for simple Bosniak type 1 and 2 renal cysts, unless the patient has a malignancy history or risk factors. No hydronephrosis. No intrarenal or ureteral calculi. The bladder is unremarkable. GI AND BOWEL: Stomach  demonstrates no acute abnormality. Appendix normal. There is no bowel obstruction. PERITONEUM AND RETROPERITONEUM: No ascites. No free air. VASCULATURE: Aorta is normal in caliber. Moderate aortoiliac  atherosclerotic calcification. Right common and external iliac artery stenting has been performed status post proximal and distal to the transplant renal artery anastomosis. Patency is not well assessed on this noncontrast examination. LYMPH NODES: No lymphadenopathy. REPRODUCTIVE ORGANS: No acute abnormality. BONES AND SOFT TISSUES: No acute osseous abnormality. Tiny fat-containing umbilical hernia. IMPRESSION: 1. No acute findings in the abdomen or pelvis. 2. Status post renal transplantation with unremarkable appearance of right iliac fossa transplant kidney. 3. Small pericardial effusion, similar to prior. 4. Moderate multivessel coronary artery calcifications. Electronically signed by: Dorethia Molt MD 06/04/2024 12:48 AM EST RP Workstation: [id number]   DG Chest Port 1 View Result Date: 06/04/2024 EXAM: 1 VIEW(S) XRAY OF THE CHEST 06/04/2024 12:07:00 AM COMPARISON: 05/10/2024 CLINICAL HISTORY: dizzy FINDINGS: LUNGS AND PLEURA: No focal pulmonary opacity. No pleural effusion. No pneumothorax. HEART AND MEDIASTINUM: No acute abnormality of the cardiac and mediastinal silhouettes. BONES AND SOFT TISSUES: Right upper quadrant surgical clips noted. Stable left breast calcifications are noted. No acute osseous abnormality. IMPRESSION: 1. No acute findings. Electronically signed by: Oneil Devonshire MD 06/04/2024 12:23 AM EST RP Workstation: [id number]    EKG: Independently reviewed.   Assessment/Plan  AKI on CKD -s/p renal transplant  -admit to progressive care  - hold nephrotoxic medications  - place on ivfs  - strict I/o  - CTAB-no new abnormality of kidney noted  -monitor urine output - renal consult Dr Macel  History of renal transplant -check Prograf  level  - continue with MMF and  hold  Prograf ( renal to restart as they see fit in setting of electrolyte abn and prolonged QT  UTI  - + wbc / nitrate clarita  - start CTX  -f/u urine culture  CHF pef -ef 75%  - well compensated   Hypokalemia  -recurrently  - continue to replete  and monitor labs bid - await further renal recs   Hypomagnesemia  - continue to replete and monitor labs bib  Prolonged QT  -ensure electrolytes replete  -avoid QT prolonging agent ( prograf  can prolong QT, renal to decide when to resume) -repeat EKG , monitor on tele  Essential Hypertension - resume home regimen, note to labile on admit with noted elevation  -do not over treat in setting of hx of orthostatic hypotension -will check orthostatics   CHFpef -monitor fluid status   HLD -continue crestor    PAD  -PAD s/p RBKA   GERD -ppi   DVT prophylaxis:  heparin  Code Status: full/ as discussed per patient wishes in event of cardiac arrest  Family Communication: none at bedside Disposition Plan: patient  expected to be admitted greater than 2 midnights  Consults called: renal dr Macel Admission status: progressive care    Camila DELENA Ned MD [facility]   If 7PM-7AM, please contact night-coverage [email or web address] Password [email or web address]  06/04/2024, 3:23 AM        [1]  Allergies Allergen Reactions   Fish Allergy Anaphylaxis    Transplant donor allergic to all seafood, and patient instructed not to eat.    Shellfish Allergy Anaphylaxis    Transplant donor was allergic and patient instructed not to eat any type of seafood.   "

## 2024-06-04 NOTE — ED Notes (Signed)
 CCMD called and verified patient on cardiac telemetry

## 2024-06-04 NOTE — Consult Note (Signed)
 Nephrology Consult   Service requesting consult: [facility] Reason for consult: AKI, renal transplant   Assessment/Recommendations:   AKI DDKT 01/24/13 -baseline Cr typically around 0.8-0.9. Followed by Dr. Gordy Blanch. -suspect AKI is secondary to prerenal injury in the context of N/V (+hyaline casts) and UTI. No obstruction of transplanted kidney -Peak Cr 2.48, down to 2.04 today -continue with rocephin  and IVF for now -if renal function worsens then will need to check transplant doppler -no indications for renal replacement therapy -Avoid nephrotoxic medications including NSAIDs and iodinated intravenous contrast exposure unless the latter is absolutely indicated.  Preferred narcotic agents for pain control are hydromorphone , fentanyl , and methadone. Morphine  should not be used. Avoid Baclofen and avoid oral sodium phosphate and magnesium  citrate based laxatives / bowel preps. Continue strict Input and Output monitoring. Will monitor the patient closely with you and intervene or adjust therapy as indicated by changes in clinical status/labs   N/V -possibly related to infection? -IVF as above  UTI -on rocephin  -per primary service  Immunosuppression Management: -maintenance IS: tacrolimus  2mg  BID, mycophenolate  360mg  BID -High Risk Medical Decision Making For Drug Therapy Requiring Intensive Monitoring For Toxicity -I/S levels: checking FK level for tomorrow. Targeting FK goal of 5-7 -Will continue intensive monitoring for drug levels and toxicity via regularly scheduled laboratory assays given the narrow therapeutic index of the immunosuppressive drug therapy listed above  Supine HTN with chronic hypotension Hypertensive cardiomyopathy -continue with home anti-HTNs for now. Recommend checking orthostats during admit-ordered for daily. C/w IVF -LVOT? Consider cardiology if orthostats do not improve despite fluids  Hypokalemia -replete PRN  Anemia -transfuse prn for Hgb <7 -Hgb  currently at goal  DM2 -mgmt per primary service  Recommendations conveyed to primary service.   Nikoleta Dady [facility] 06/04/2024 1:40 PM   _____________________________________________________________________________________   History of Present Illness: [name] is a/an 53 y.o. female with a past medical history of DDKT 01/2013 DM2 CHF, PAD status post right BKA, GERD, supine hypertension with orthostatic hypotension, history of recurrent UTIs, hyperlipidemia who presents to [facility] with abnormal labs. She had labs done on 12/10 for Dr. Blanch which showed that her Cr increased to 1.3, positive CMV but under threshold, FK level 13.9, UPC up to ~1.5g, granular casts on urine sediment, Allosure score of 0.24 (WNL). Repeat labs on 12/17 showed that her Cr went up to 2.3, hence sent to the ER. Patient seen in ER. Was recently admitted in Nov for syncope/N/V, had orthostatic hypotension. She did have AKI at that time. Clonidine  stopped at that time and norvasc +metoprolol  dose decreased. She did have an echo done that time which showed hyperdynamic LV with concern for outflow obstruction given severe LVH. She reports that she has been feeling weak again, assc'ed with N/V. Denies any dysuria, pain over allograft site, missed IS meds, CP, SOB.    Medications:  Current Facility-Administered Medications  Medication Dose Route Frequency Provider Last Rate Last Admin   0.9 %  sodium chloride  infusion   Intravenous Continuous Rai, Ripudeep K, MD 100 mL/hr at 06/04/24 0825 New Bag at 06/04/24 0825   acetaminophen  (TYLENOL ) tablet 650 mg  650 mg Oral Q6H PRN Debby Camila LABOR, MD       Or   acetaminophen  (TYLENOL ) suppository 650 mg  650 mg Rectal Q6H PRN Debby Camila LABOR, MD       amLODipine  (NORVASC ) tablet 5 mg  5 mg Oral QHS Debby Camila LABOR, MD       cefTRIAXone  (ROCEPHIN )  1 g in sodium chloride  0.9 % 100 mL IVPB  1 g Intravenous Q24H Debby Camila LABOR, MD   Stopped  at 06/04/24 9358   clopidogrel  (PLAVIX ) tablet 75 mg  75 mg Oral QHS Thomas, Sara-Maiz A, MD       heparin  injection 5,000 Units  5,000 Units Subcutaneous Q8H Debby Camila LABOR, MD   5,000 Units at 06/04/24 1328   insulin  aspart (novoLOG ) injection 0-5 Units  0-5 Units Subcutaneous QHS Rai, Ripudeep K, MD       insulin  aspart (novoLOG ) injection 0-9 Units  0-9 Units Subcutaneous TID WC Rai, Ripudeep K, MD   2 Units at 06/04/24 1317   magnesium  oxide (MAG-OX) tablet 800 mg  800 mg Oral Once Bero, Michael M, MD       magnesium  sulfate IVPB 2 g 50 mL  2 g Intravenous Once Debby Camila LABOR, MD   Stopped at 06/04/24 9065   metoprolol  succinate (TOPROL -XL) 24 hr tablet 50 mg  50 mg Oral QHS Rai, Ripudeep K, MD       mycophenolate  (MYFORTIC ) EC tablet 360 mg  360 mg Oral BID Thomas, Sara-Maiz A, MD   360 mg at 06/04/24 1106   pantoprazole  (PROTONIX ) EC tablet 40 mg  40 mg Oral QHS Debby Camila LABOR, MD       potassium chloride  SA (KLOR-CON  M) CR tablet 40 mEq  40 mEq Oral Once Bero, Michael M, MD       rosuvastatin  (CRESTOR ) tablet 20 mg  20 mg Oral QHS Debby Camila LABOR, MD       tacrolimus  (PROGRAF ) capsule 2 mg  2 mg Oral BID Thomas, Sara-Maiz A, MD   2 mg at 06/04/24 1106   Current Outpatient Medications  Medication Sig Dispense Refill   amLODipine  (NORVASC ) 5 MG tablet Take 1 tablet (5 mg total) by mouth at bedtime. 30 tablet 0   clopidogrel  (PLAVIX ) 75 MG tablet Take 1 tablet (75 mg total) by mouth daily. (Patient taking differently: Take 75 mg by mouth at bedtime.) 90 tablet 3   furosemide  (LASIX ) 40 MG tablet Take 1 tablet (40 mg total) by mouth daily as needed for edema or fluid.     HUMALOG KWIKPEN 100 UNIT/ML KwikPen Inject 0-5 Units into the skin 3 (three) times daily with meals as needed (DMII).     LANTUS  SOLOSTAR 100 UNIT/ML Solostar Pen Inject 10 Units into the skin 2 (two) times daily.     Melatonin 10 MG TABS Take 20 mg by mouth at bedtime.     methenamine (HIPREX) 1 g  tablet Take 1 g by mouth 2 (two) times daily with a meal.     metoprolol  succinate (TOPROL -XL) 25 MG 24 hr tablet Take 1 tablet (25 mg total) by mouth at bedtime. 30 tablet 0   mycophenolate  (MYFORTIC ) 180 MG EC tablet Take 360 mg by mouth in the morning and at bedtime. (360mg ) in the morning and bedtime-( capsules )     pantoprazole  (PROTONIX ) 40 MG tablet Take 40 mg by mouth at bedtime.      promethazine  (PHENERGAN ) 12.5 MG tablet Take 12.5 mg by mouth every 8 (eight) hours as needed for nausea.     rosuvastatin  (CRESTOR ) 20 MG tablet Take 1 tablet (20 mg total) by mouth daily. (Patient taking differently: Take 20 mg by mouth at bedtime.) 90 tablet 3   tacrolimus  (PROGRAF ) 1 MG capsule Take 2 mg by mouth in the morning and at bedtime.  tirzepatide (MOUNJARO) 10 MG/0.5ML Pen Inject 10 mg into the skin every Wednesday.       ALLERGIES Fish allergy and Shellfish allergy  MEDICAL HISTORY Past Medical History:  Diagnosis Date   Anemia    Below knee amputation (HCC)    RIGHT   CHF (congestive heart failure) (HCC)    PT UNAWARE OF THIS DX   Chronic kidney disease    PAST HX OF ESRD- HAD KIDNEY TRANSPLANT 01-24-2013   Critical lower limb ischemia (HCC) 10/05/2015   Enlarged heart    WITH IRREGULAR HEART RATE   GERD (gastroesophageal reflux disease)    High cholesterol    History of blood transfusion    related to kidney transplant   Hypercholesteremia 08/14/2006   Qualifier: Diagnosis of  By: [name]     Hypertension    Kidney transplant recipient    01/24/2013   Mechanical complication of other vascular device, implant, and graft 09/22/2012   Metabolic bone disease    Orthostatic hypotension    PAD (peripheral artery disease)    PT UNAWARE OF THIS DIAGNOSIS   Pericardial effusion    Retinopathy    Type II diabetes mellitus (HCC)    controlled with diet     SOCIAL HISTORY Social History   Socioeconomic History   Marital status: Single    Spouse name: Not on  file   Number of children: 0   Years of education: Not on file   Highest education level: Not on file  Occupational History   Not on file  Tobacco Use   Smoking status: Never   Smokeless tobacco: Never  Vaping Use   Vaping status: Never Used  Substance and Sexual Activity   Alcohol use: No   Drug use: No   Sexual activity: Not on file  Other Topics Concern   Not on file  Social History Narrative   Not on file   Social Drivers of Health   Tobacco Use: Low Risk (06/03/2024)   Patient History    Smoking Tobacco Use: Never    Smokeless Tobacco Use: Never    Passive Exposure: Not on file  Financial Resource Strain: Not on file  Food Insecurity: Patient Declined (05/11/2024)   Epic    Worried About Programme Researcher, Broadcasting/film/video in the Last Year: Patient declined    Barista in the Last Year: Patient declined  Transportation Needs: Patient Declined (05/11/2024)   Epic    Lack of Transportation (Medical): Patient declined    Lack of Transportation (Non-Medical): Patient declined  Physical Activity: Not on file  Stress: Not on file  Social Connections: Not on file  Intimate Partner Violence: Patient Declined (05/11/2024)   Epic    Fear of Current or Ex-Partner: Patient declined    Emotionally Abused: Patient declined    Physically Abused: Patient declined    Sexually Abused: Patient declined  Depression (PHQ2-9): Not on file  Alcohol Screen: Not on file  Housing: Unknown (05/11/2024)   Epic    Unable to Pay for Housing in the Last Year: Patient declined    Number of Times Moved in the Last Year: 0    Homeless in the Last Year: Patient declined  Utilities: Patient Declined (05/11/2024)   Epic    Threatened with loss of utilities: Patient declined  Health Literacy: Not on file     FAMILY HISTORY Family History  Problem Relation Age of Onset   Malignant hyperthermia Mother    Thyroid  disease Mother  Hypertension Mother    Malignant hyperthermia Father     Hypertension Father    Diabetes Brother    Anesthesia problems Neg Hx    Colon cancer Neg Hx    Esophageal cancer Neg Hx    Colon polyps Neg Hx    Rectal cancer Neg Hx    Stomach cancer Neg Hx      Review of Systems: 12 systems reviewed Otherwise as per HPI, all other systems reviewed and negative  Physical Exam: Vitals:   06/04/24 0822 06/04/24 1230  BP: (!) 161/65 (!) 175/77  Pulse: (!) 105 (!) 105  Resp: 20 18  Temp: 97.8 F (36.6 C) 97.9 F (36.6 C)  SpO2: 98% 100%   No intake/output data recorded.  Intake/Output Summary (Last 24 hours) at 06/04/2024 1340 Last data filed at 06/04/2024 0505 Gross per 24 hour  Intake 1250 ml  Output --  Net 1250 ml   General: well-appearing, no acute distress, laying flat in bed HEENT: anicteric sclera, oropharynx clear without lesions CV: regular rate, normal rhythm, no murmurs, no gallops, no rubs, no peripheral edema Lungs: clear to auscultation bilaterally, normal work of breathing Abd: soft, non-tender, non-distended Skin: no visible lesions or rashes Psych: alert, engaged, appropriate mood and affect Musculoskeletal: right BKA, no other obvious deformities Neuro: normal speech, no gross focal deficits  Dialysis access: RUE AVF +b/t  Test Results Reviewed Lab Results  Component Value Date   NA 139 06/04/2024   K 3.0 (L) 06/04/2024   CL 93 (L) 06/04/2024   CO2 26 06/04/2024   BUN 32 (H) 06/04/2024   CREATININE 2.04 (H) 06/04/2024   CALCIUM  8.8 (L) 06/04/2024   ALBUMIN  4.2 06/03/2024   PHOS 3.6 12/08/2015     I have reviewed all relevant outside healthcare records related to the patient's kidney injury.

## 2024-06-05 DIAGNOSIS — E876 Hypokalemia: Secondary | ICD-10-CM | POA: Diagnosis not present

## 2024-06-05 DIAGNOSIS — R112 Nausea with vomiting, unspecified: Secondary | ICD-10-CM

## 2024-06-05 DIAGNOSIS — N179 Acute kidney failure, unspecified: Secondary | ICD-10-CM

## 2024-06-05 LAB — GLUCOSE, CAPILLARY
Glucose-Capillary: 134 mg/dL — ABNORMAL HIGH (ref 70–99)
Glucose-Capillary: 112 mg/dL — ABNORMAL HIGH (ref 70–99)
Glucose-Capillary: 163 mg/dL — ABNORMAL HIGH (ref 70–99)
Glucose-Capillary: 203 mg/dL — ABNORMAL HIGH (ref 70–99)

## 2024-06-05 LAB — BASIC METABOLIC PANEL WITH GFR
Anion gap: 13 (ref 5–15)
BUN: 17 mg/dL (ref 6–20)
CO2: 21 mmol/L — ABNORMAL LOW (ref 22–32)
Calcium: 8.4 mg/dL — ABNORMAL LOW (ref 8.9–10.3)
Chloride: 103 mmol/L (ref 98–111)
Creatinine, Ser: 1.18 mg/dL — ABNORMAL HIGH (ref 0.44–1.00)
GFR, Estimated: 55 mL/min — ABNORMAL LOW
Glucose, Bld: 141 mg/dL — ABNORMAL HIGH (ref 70–99)
Potassium: 3.6 mmol/L (ref 3.5–5.1)
Sodium: 137 mmol/L (ref 135–145)

## 2024-06-05 LAB — COMPREHENSIVE METABOLIC PANEL WITH GFR
ALT: 6 U/L (ref 0–44)
AST: 19 U/L (ref 15–41)
Albumin: 3.5 g/dL (ref 3.5–5.0)
Alkaline Phosphatase: 58 U/L (ref 38–126)
Anion gap: 14 (ref 5–15)
BUN: 24 mg/dL — ABNORMAL HIGH (ref 6–20)
CO2: 27 mmol/L (ref 22–32)
Calcium: 8.7 mg/dL — ABNORMAL LOW (ref 8.9–10.3)
Chloride: 99 mmol/L (ref 98–111)
Creatinine, Ser: 1.49 mg/dL — ABNORMAL HIGH (ref 0.44–1.00)
GFR, Estimated: 41 mL/min — ABNORMAL LOW
Glucose, Bld: 123 mg/dL — ABNORMAL HIGH (ref 70–99)
Potassium: 2.6 mmol/L — CL (ref 3.5–5.1)
Sodium: 140 mmol/L (ref 135–145)
Total Bilirubin: 0.4 mg/dL (ref 0.0–1.2)
Total Protein: 5.7 g/dL — ABNORMAL LOW (ref 6.5–8.1)

## 2024-06-05 LAB — CBC
HCT: 32.4 % — ABNORMAL LOW (ref 36.0–46.0)
Hemoglobin: 10.4 g/dL — ABNORMAL LOW (ref 12.0–15.0)
MCH: 24.8 pg — ABNORMAL LOW (ref 26.0–34.0)
MCHC: 32.1 g/dL (ref 30.0–36.0)
MCV: 77.3 fL — ABNORMAL LOW (ref 80.0–100.0)
Platelets: 213 K/uL (ref 150–400)
RBC: 4.19 MIL/uL (ref 3.87–5.11)
RDW: 14.4 % (ref 11.5–15.5)
WBC: 6.7 K/uL (ref 4.0–10.5)
nRBC: 0 % (ref 0.0–0.2)

## 2024-06-05 LAB — TACROLIMUS LEVEL: Tacrolimus (FK506) - LabCorp: 9.4 ng/mL (ref 5.0–20.0)

## 2024-06-05 LAB — POTASSIUM: Potassium: 3.6 mmol/L (ref 3.5–5.1)

## 2024-06-05 LAB — C DIFFICILE QUICK SCREEN W PCR REFLEX
C Diff antigen: NEGATIVE
C Diff interpretation: NOT DETECTED
C Diff toxin: NEGATIVE

## 2024-06-05 LAB — URINE CULTURE

## 2024-06-05 MED ORDER — POTASSIUM CHLORIDE 10 MEQ/100ML IV SOLN
10.0000 meq | INTRAVENOUS | Status: AC
  Administered 2024-06-05 (×4): 10 meq via INTRAVENOUS
  Filled 2024-06-05 (×3): qty 100

## 2024-06-05 MED ORDER — POTASSIUM CHLORIDE 10 MEQ/100ML IV SOLN
10.0000 meq | INTRAVENOUS | Status: AC
  Administered 2024-06-05 (×4): 10 meq via INTRAVENOUS
  Filled 2024-06-05 (×4): qty 100

## 2024-06-05 MED ORDER — LACTATED RINGERS IV SOLN: INTRAVENOUS | Status: DC

## 2024-06-05 MED ORDER — PROCHLORPERAZINE EDISYLATE 10 MG/2ML IJ SOLN: 10.0000 mg | Freq: Four times a day (QID) | INTRAMUSCULAR | Status: DC | PRN

## 2024-06-05 MED ORDER — POTASSIUM CHLORIDE CRYS ER 20 MEQ PO TBCR
40.0000 meq | EXTENDED_RELEASE_TABLET | Freq: Once | ORAL | Status: DC
  Filled 2024-06-05: qty 2

## 2024-06-05 NOTE — Progress Notes (Signed)
 "          [facility]                                                                              Melody Cirrincione, is a 53 y.o. female, DOB - 11-27-1970, FMW:[id number] Admit date - 06/03/2024    Outpatient Primary MD for the patient is [name], [name], MD  LOS - 1  days  Chief Complaint  Patient presents with   Abnormal Lab       Brief summary   Patient is a 53 year old female with ESRD status post renal transplant in 2014., HTN, chronic orthostatic hypotension, recurrent UTIs, sinusitis, IDDM, hyperlipidemia, diabetic neuropathy, retinopathy, severe PAD, right BKA, presented to ED due to abnormal labs.  Patient's PCP had called her for elevated creatinine and low potassium.  Patient reported ongoing dizziness, near syncopal episode when standing up from chair at home. In ED patient noted to have sodium 40, potassium 2.7, chloride 89, BUN 36, creatinine 2.48, magnesium  1.1, CBC unremarkable  CT abdomen pelvis showed no acute findings   Assessment & Plan       AKI (acute kidney injury) - Likely due to hypovolemia, during encounter patient noted to have intractable nausea and vomiting.  Creatinine 2.48 on admission.  Baseline creatinine 1.3-1.6 - Hold Mounjaro, Lasix  (outpatient as needed), avoid nephrotoxic meds - Continue IV fluid hydration, - CT abdomen pelvis showed no acute findings.  Creatinine improving, 1.4  - nephrology following   Intractable nausea and vomiting, left-sided flank pain, UTI with?  Pyelonephritis -No acute fevers or abdominal pain.  During encounter, patient noted to have intractable nausea and vomiting -CT abdomen did not show any SBO or acute infectious intra-abdominal pathology -Hold Mounjaro - 1 still try soft solids today, diet advanced  - UA positive for UTI,?  Pyelonephritis, follow urine culture  - Continue IV Rocephin   Diarrhea - New, continue IV fluids, obtain C. difficile, GI pathogen panel  Severe hypokalemia,  hypomagnesemia - Likely due to intractable nausea and vomiting, - Continue to replace potassium IV, states unable to tolerate p.o.  ESRD with history of renal transplant -Obtain tacrolimus  level. - Creatinine improving, nephrology consulted. - Continue mycophenolate , tacrolimus     Essential hypertension - Continue Toprol -XL 50 mg daily, amlodipine      Insulin  dependent type 2 diabetes mellitus (HCC) -Hemoglobin A1c 8.3 on 05/10/2024 -Patient has been taking Mounjaro, hold inpatient  CBG (last 3)  Recent Labs    06/04/24 2107 06/05/24 0633 06/05/24 1134  GLUCAP 122* 134* 112*   Continue sliding scale insulin    Severe PAD S/P BKA (below knee amputation) unilateral, right (HCC) - Continue Crestor , Plavix   History of chronic diastolic CHF - Monitor volume status.  GERD - Continue PPI  Estimated body mass index is 26.57 kg/m as calculated from the following:   Height as of this encounter: 5' (1.524 m).   Weight as of this encounter: 61.7 kg.  Code Status: Full code DVT Prophylaxis:  heparin  injection 5,000 Units Start: 06/04/24 0600   Level of Care: Level of care: Progressive Family Communication: Updated patient Disposition Plan:      Remains inpatient appropriate:      Procedures:  Consultants:   Nephrology  Antimicrobials:   Anti-infectives (From admission, onward)    Start     Dose/Rate Route Frequency Ordered Stop   06/04/24 0600  cefTRIAXone  (ROCEPHIN ) 1 g in sodium chloride  0.9 % 100 mL IVPB        1 g 200 mL/hr over 30 Minutes Intravenous Every 24 hours 06/04/24 0519            Medications  amLODipine   5 mg Oral QHS   clopidogrel   75 mg Oral QHS   heparin   5,000 Units Subcutaneous Q8H   insulin  aspart  0-5 Units Subcutaneous QHS   insulin  aspart  0-9 Units Subcutaneous TID WC   magnesium  oxide  800 mg Oral Once   metoprolol  succinate  50 mg Oral QHS   mycophenolate   360 mg Oral BID   pantoprazole   40 mg Oral QHS   rosuvastatin   20  mg Oral QHS   tacrolimus   2 mg Oral BID      Subjective:   [name] was seen and examined today.  States feeling somewhat better today, wants to try soft solids, abdominal pain improving. + Diarrhea.  No fevers or chills.    Objective:   Vitals:   06/04/24 2315 06/05/24 0328 06/05/24 0813 06/05/24 1137  BP: (!) 157/68 (!) 143/65 (!) 158/65 (!) 145/65  Pulse: (!) 107 (!) 101 98 100  Resp: 18 18 15 18   Temp: 98.4 F (36.9 C) 98.3 F (36.8 C) 98.3 F (36.8 C) 97.9 F (36.6 C)  TempSrc: Oral Oral Oral Oral  SpO2: 97% 98% 100% 93%  Weight:      Height:        Intake/Output Summary (Last 24 hours) at 06/05/2024 1202 Last data filed at 06/05/2024 0700 Gross per 24 hour  Intake 1741.95 ml  Output 250 ml  Net 1491.95 ml     Wt Readings from Last 3 Encounters:  06/04/24 61.7 kg  05/20/24 68.1 kg  05/11/24 67.9 kg   Physical Exam General: Alert and oriented x 3, NAD Cardiovascular: S1 S2 clear, RRR.  Respiratory: CTAB, no wheezing Gastrointestinal: Soft, nontender, nondistended, NBS Ext: Right BKA Neuro: no new deficits Psych: Normal affect       Data Reviewed:  I have personally reviewed following labs    CBC Lab Results  Component Value Date   WBC 6.7 06/05/2024   RBC 4.19 06/05/2024   HGB 10.4 (L) 06/05/2024   HCT 32.4 (L) 06/05/2024   MCV 77.3 (L) 06/05/2024   MCH 24.8 (L) 06/05/2024   PLT 213 06/05/2024   MCHC 32.1 06/05/2024   RDW 14.4 06/05/2024   LYMPHSABS 1.2 05/10/2024   MONOABS 0.7 05/10/2024   EOSABS 0.0 05/10/2024   BASOSABS 0.0 05/10/2024     Last metabolic panel Lab Results  Component Value Date   NA 140 06/05/2024   K 2.6 (LL) 06/05/2024   CL 99 06/05/2024   CO2 27 06/05/2024   BUN 24 (H) 06/05/2024   CREATININE 1.49 (H) 06/05/2024   GLUCOSE 123 (H) 06/05/2024   GFRNONAA 41 (L) 06/05/2024   GFRAA >60 07/28/2019   CALCIUM  8.7 (L) 06/05/2024   PHOS 3.6 12/08/2015   PROT 5.7 (L) 06/05/2024   ALBUMIN  3.5 06/05/2024    BILITOT 0.4 06/05/2024   ALKPHOS 58 06/05/2024   AST 19 06/05/2024   ALT 6 06/05/2024   ANIONGAP 14 06/05/2024    CBG (last 3)  Recent Labs    06/04/24 2107 06/05/24 9366 06/05/24 1134  GLUCAP 122* 134* 112*      Coagulation Profile: No results for input(s): INR, PROTIME in the last 168 hours.   Radiology Studies: I have personally reviewed the imaging studies  CT ABDOMEN PELVIS WO CONTRAST Result Date: 06/04/2024 EXAM: CT ABDOMEN AND PELVIS WITHOUT CONTRAST 06/04/2024 12:30:46 AM TECHNIQUE: CT of the abdomen and pelvis was performed without the administration of intravenous contrast. Multiplanar reformatted images are provided for review. Automated exposure control, iterative reconstruction, and/or weight-based adjustment of the mA/kV was utilized to reduce the radiation dose to as low as reasonably achievable. COMPARISON: None available. CLINICAL HISTORY: Abdominal pain, acute, nonlocalized. FINDINGS: LOWER CHEST: Small pericardial effusion, similar to the number 245. Moderate multivessel coronary artery calcifications. LIVER: The liver is unremarkable. GALLBLADDER AND BILE DUCTS: Status post cholecystectomy. No biliary ductal dilatation. SPLEEN: No acute abnormality. PANCREAS: No acute abnormality. ADRENAL GLANDS: No acute abnormality. KIDNEYS, URETERS AND BLADDER: The native kidneys are markedly atrophic. Right iliac fossa transplant kidney noted. Simple cortical cyst noted involving the transplant kidney. Per consensus, no follow-up is needed for simple Bosniak type 1 and 2 renal cysts, unless the patient has a malignancy history or risk factors. No hydronephrosis. No intrarenal or ureteral calculi. The bladder is unremarkable. GI AND BOWEL: Stomach demonstrates no acute abnormality. Appendix normal. There is no bowel obstruction. PERITONEUM AND RETROPERITONEUM: No ascites. No free air. VASCULATURE: Aorta is normal in caliber. Moderate aortoiliac atherosclerotic calcification. Right  common and external iliac artery stenting has been performed status post proximal and distal to the transplant renal artery anastomosis. Patency is not well assessed on this noncontrast examination. LYMPH NODES: No lymphadenopathy. REPRODUCTIVE ORGANS: No acute abnormality. BONES AND SOFT TISSUES: No acute osseous abnormality. Tiny fat-containing umbilical hernia. IMPRESSION: 1. No acute findings in the abdomen or pelvis. 2. Status post renal transplantation with unremarkable appearance of right iliac fossa transplant kidney. 3. Small pericardial effusion, similar to prior. 4. Moderate multivessel coronary artery calcifications. Electronically signed by: Dorethia Molt MD 06/04/2024 12:48 AM EST RP Workstation: [id number]   DG Chest Port 1 View Result Date: 06/04/2024 EXAM: 1 VIEW(S) XRAY OF THE CHEST 06/04/2024 12:07:00 AM COMPARISON: 05/10/2024 CLINICAL HISTORY: dizzy FINDINGS: LUNGS AND PLEURA: No focal pulmonary opacity. No pleural effusion. No pneumothorax. HEART AND MEDIASTINUM: No acute abnormality of the cardiac and mediastinal silhouettes. BONES AND SOFT TISSUES: Right upper quadrant surgical clips noted. Stable left breast calcifications are noted. No acute osseous abnormality. IMPRESSION: 1. No acute findings. Electronically signed by: Oneil Devonshire MD 06/04/2024 12:23 AM EST RP Workstation: [id number] Nydia Distance M.D. [facility] 06/05/2024, 12:02 PM  Available via Epic secure chat 7am-7pm After 7 pm, please refer to night coverage provider listed on amion.    "

## 2024-06-05 NOTE — Progress Notes (Signed)
 " [facility] Progress Note    Assessment/ Plan:   AKI DDKT 01/24/13 -good allograft function at baseline--baseline Cr typically around 0.8-0.9. Followed by Dr. Gordy Blanch. -suspect AKI is secondary to prerenal injury in the context of N/V (+hyaline casts) and UTI. No obstruction of transplanted kidney seen on initial imaging -Peak Cr 2.48, down to 1.49 today -continue with rocephin  and IVF for now especially in light of diarrhea. Will switch NS to LR 100cc/hr x 1 day then reassess (remains euvolemic on exam) -if renal function worsens then will need to check transplant doppler, not the case currently -no indications for renal replacement therapy -Avoid nephrotoxic medications including NSAIDs and iodinated intravenous contrast exposure unless the latter is absolutely indicated.  Preferred narcotic agents for pain control are hydromorphone , fentanyl , and methadone. Morphine  should not be used. Avoid Baclofen and avoid oral sodium phosphate and magnesium  citrate based laxatives / bowel preps. Continue strict Input and Output monitoring. Will monitor the patient closely with you and intervene or adjust therapy as indicated by changes in clinical status/labs    N/V -possibly related to infection? -IVF as above  Diarrhea -new -c/w IVF -on MMF, will maintain dose for now -further w/u per primary service -of note, diarrhea puts her at risk for supratherapeutic tacrolimus  levels, no signs/symptoms to suggest this at this current moment   UTI -on rocephin  -per primary service   Immunosuppression Management: -maintenance IS: tacrolimus  2mg  BID, mycophenolate  360mg  BID -High Risk Medical Decision Making For Drug Therapy Requiring Intensive Monitoring For Toxicity -I/S levels: checking FK level pending. Targeting FK goal of 5-7 -LFTs: WNL 12/20 -Will continue intensive monitoring for drug levels and toxicity via regularly scheduled laboratory assays given the narrow therapeutic  index of the immunosuppressive drug therapy listed above   Supine HTN with chronic hypotension Hypertensive cardiomyopathy -continue with home anti-HTNs for now. Recommend checking orthostats during admit-ordered for daily. C/w IVF -LVOT? Consider cardiology if orthostats do not improve despite fluids   Hypokalemia -replete PRN, currently receiving IV Kcl, does not tolerated oral potassium, switching fluids to LR   Anemia -transfuse prn for Hgb <7 -Hgb 10.4   DM2 -mgmt per primary service  Subjective:   Patient seen and examined bedside. Hypokalemic on am labs, receiving iv kcl. Has been having diarrhea today which is new Denies any HA, blurry vision, worsening gait, tremors, CP, SOB.   Objective:   BP (!) 158/65 (BP Location: Left Arm)   Pulse 98   Temp 98.3 F (36.8 C) (Oral)   Resp 15   Ht 5' (1.524 m)   Wt 61.7 kg   LMP 07/02/2022   SpO2 100%   BMI 26.57 kg/m   Intake/Output Summary (Last 24 hours) at 06/05/2024 1001 Last data filed at 06/05/2024 0700 Gross per 24 hour  Intake 1741.95 ml  Output 250 ml  Net 1491.95 ml   Weight change: -2.711 kg  Physical Exam: Gen: NAD CVS: RRR Resp: CTA BL Abd: soft, no tenderness on palpation over allograft side Ext: no edema, right bka Neuro: awake, alert, no tremors Dialysis access: RUE AVF +b/t  Imaging: CT ABDOMEN PELVIS WO CONTRAST Result Date: 06/04/2024 EXAM: CT ABDOMEN AND PELVIS WITHOUT CONTRAST 06/04/2024 12:30:46 AM TECHNIQUE: CT of the abdomen and pelvis was performed without the administration of intravenous contrast. Multiplanar reformatted images are provided for review. Automated exposure control, iterative reconstruction, and/or weight-based adjustment of the mA/kV was utilized to reduce the radiation dose to as low as reasonably achievable. COMPARISON: None  available. CLINICAL HISTORY: Abdominal pain, acute, nonlocalized. FINDINGS: LOWER CHEST: Small pericardial effusion, similar to the number 245.  Moderate multivessel coronary artery calcifications. LIVER: The liver is unremarkable. GALLBLADDER AND BILE DUCTS: Status post cholecystectomy. No biliary ductal dilatation. SPLEEN: No acute abnormality. PANCREAS: No acute abnormality. ADRENAL GLANDS: No acute abnormality. KIDNEYS, URETERS AND BLADDER: The native kidneys are markedly atrophic. Right iliac fossa transplant kidney noted. Simple cortical cyst noted involving the transplant kidney. Per consensus, no follow-up is needed for simple Bosniak type 1 and 2 renal cysts, unless the patient has a malignancy history or risk factors. No hydronephrosis. No intrarenal or ureteral calculi. The bladder is unremarkable. GI AND BOWEL: Stomach demonstrates no acute abnormality. Appendix normal. There is no bowel obstruction. PERITONEUM AND RETROPERITONEUM: No ascites. No free air. VASCULATURE: Aorta is normal in caliber. Moderate aortoiliac atherosclerotic calcification. Right common and external iliac artery stenting has been performed status post proximal and distal to the transplant renal artery anastomosis. Patency is not well assessed on this noncontrast examination. LYMPH NODES: No lymphadenopathy. REPRODUCTIVE ORGANS: No acute abnormality. BONES AND SOFT TISSUES: No acute osseous abnormality. Tiny fat-containing umbilical hernia. IMPRESSION: 1. No acute findings in the abdomen or pelvis. 2. Status post renal transplantation with unremarkable appearance of right iliac fossa transplant kidney. 3. Small pericardial effusion, similar to prior. 4. Moderate multivessel coronary artery calcifications. Electronically signed by: Dorethia Molt MD 06/04/2024 12:48 AM EST RP Workstation: [id number]   DG Chest Port 1 View Result Date: 06/04/2024 EXAM: 1 VIEW(S) XRAY OF THE CHEST 06/04/2024 12:07:00 AM COMPARISON: 05/10/2024 CLINICAL HISTORY: dizzy FINDINGS: LUNGS AND PLEURA: No focal pulmonary opacity. No pleural effusion. No pneumothorax. HEART AND MEDIASTINUM: No acute  abnormality of the cardiac and mediastinal silhouettes. BONES AND SOFT TISSUES: Right upper quadrant surgical clips noted. Stable left breast calcifications are noted. No acute osseous abnormality. IMPRESSION: 1. No acute findings. Electronically signed by: Oneil Devonshire MD 06/04/2024 12:23 AM EST RP Workstation: [id number]    Labs: BMET Recent Labs  Lab 06/03/24 2342 06/04/24 0605 06/04/24 1950 06/04/24 2131 06/05/24 0321  NA 140 139 139 140 140  K 2.7* 3.0* 2.8* 2.8* 2.6*  CL 89* 93* 95* 96* 99  CO2 28 26 27 26 27   GLUCOSE 208* 139* 124* 118* 123*  BUN 36* 32* 27* 26* 24*  CREATININE 2.48* 2.04* 1.66* 1.58* 1.49*  CALCIUM  9.4 8.8* 9.2 9.0 8.7*   CBC Recent Labs  Lab 06/03/24 2342 06/04/24 0605 06/05/24 0321  WBC 7.7 8.6 6.7  HGB 12.4 11.4* 10.4*  HCT 39.2 36.6 32.4*  MCV 77.9* 80.1 77.3*  PLT 246 213 213    Medications:     amLODipine   5 mg Oral QHS   clopidogrel   75 mg Oral QHS   heparin   5,000 Units Subcutaneous Q8H   insulin  aspart  0-5 Units Subcutaneous QHS   insulin  aspart  0-9 Units Subcutaneous TID WC   magnesium  oxide  800 mg Oral Once   metoprolol  succinate  50 mg Oral QHS   mycophenolate   360 mg Oral BID   pantoprazole   40 mg Oral QHS   rosuvastatin   20 mg Oral QHS   tacrolimus   2 mg Oral BID      Ephriam Stank, MD [facility] 06/05/2024, 10:01 AM   "

## 2024-06-05 NOTE — Progress Notes (Addendum)
 [facility] night cross cover note:   I was notified by the patient's RN of the patient's updated potassium level this morning of 2.6.  She received 30 mill equivalents of IV potassium chloride  overnight.  There was previously an order for potassium chloride  40 meq p.o. x 1 dose earlier this evening, but it does not appear that this was administered.  I will reorder this dose of Kcl 40 meq po x 1 now.    Update: patient conveys that she was not able to tolerate p.o. potassium supplementation in the emergency department this evening, and request that this most recent order for p.o. potassium be changed to IV route of administration.  Per patient's request, I subsequently discontinued no recent order for potassium chloride  40 mill equivalents p.o. x 1 dose, and replaced this with a new order for potassium chloride  40 meq IV over 4 hours.     Eva Pore, DO Hospitalist

## 2024-06-06 DIAGNOSIS — N179 Acute kidney failure, unspecified: Secondary | ICD-10-CM | POA: Diagnosis not present

## 2024-06-06 DIAGNOSIS — E876 Hypokalemia: Secondary | ICD-10-CM | POA: Diagnosis not present

## 2024-06-06 DIAGNOSIS — R112 Nausea with vomiting, unspecified: Secondary | ICD-10-CM | POA: Diagnosis not present

## 2024-06-06 LAB — GLUCOSE, CAPILLARY
Glucose-Capillary: 128 mg/dL — ABNORMAL HIGH (ref 70–99)
Glucose-Capillary: 179 mg/dL — ABNORMAL HIGH (ref 70–99)
Glucose-Capillary: 175 mg/dL — ABNORMAL HIGH (ref 70–99)
Glucose-Capillary: 179 mg/dL — ABNORMAL HIGH (ref 70–99)

## 2024-06-06 LAB — BASIC METABOLIC PANEL WITH GFR
Anion gap: 10 (ref 5–15)
BUN: 12 mg/dL (ref 6–20)
CO2: 24 mmol/L (ref 22–32)
Calcium: 8.3 mg/dL — ABNORMAL LOW (ref 8.9–10.3)
Chloride: 105 mmol/L (ref 98–111)
Creatinine, Ser: 1.05 mg/dL — ABNORMAL HIGH (ref 0.44–1.00)
GFR, Estimated: >60 mL/min
Glucose, Bld: 126 mg/dL — ABNORMAL HIGH (ref 70–99)
Potassium: 2.9 mmol/L — ABNORMAL LOW (ref 3.5–5.1)
Sodium: 139 mmol/L (ref 135–145)

## 2024-06-06 LAB — GASTROINTESTINAL PANEL BY PCR, STOOL (REPLACES STOOL CULTURE)

## 2024-06-06 LAB — MAGNESIUM: Magnesium: 1.5 mg/dL — ABNORMAL LOW (ref 1.7–2.4)

## 2024-06-06 MED ORDER — POTASSIUM CHLORIDE 10 MEQ/100ML IV SOLN
10.0000 meq | INTRAVENOUS | Status: AC
  Administered 2024-06-06 (×6): 10 meq via INTRAVENOUS
  Filled 2024-06-06 (×6): qty 100

## 2024-06-06 MED ORDER — MAGNESIUM SULFATE 50 % IJ SOLN
4.0000 g | Freq: Once | INTRAVENOUS | Status: AC
  Administered 2024-06-06: 4 g via INTRAVENOUS
  Filled 2024-06-06: qty 8

## 2024-06-06 MED ORDER — POTASSIUM CHLORIDE 20 MEQ PO PACK
40.0000 meq | PACK | Freq: Two times a day (BID) | ORAL | Status: AC
  Administered 2024-06-06: 40 meq via ORAL
  Filled 2024-06-06: qty 2

## 2024-06-06 NOTE — Progress Notes (Signed)
 " [facility] Progress Note    Assessment/ Plan:   AKI DDKT 01/24/13 -good allograft function at baseline--baseline Cr typically around 0.8-0.9. Followed by Dr. Gordy Blanch. -suspect AKI is secondary to prerenal injury in the context of N/V (+hyaline casts) and UTI. No obstruction of transplanted kidney seen on initial imaging -Peak Cr 2.48, down to 1.05 today -continue with rocephin  and IVF. Should finish off LR today, encouraged PO hydration in the interim -if renal function worsens then will need to check transplant doppler, not the case currently -no indications for renal replacement therapy -Avoid nephrotoxic medications including NSAIDs and iodinated intravenous contrast exposure unless the latter is absolutely indicated.  Preferred narcotic agents for pain control are hydromorphone , fentanyl , and methadone. Morphine  should not be used. Avoid Baclofen and avoid oral sodium phosphate and magnesium  citrate based laxatives / bowel preps. Continue strict Input and Output monitoring. Will monitor the patient closely with you and intervene or adjust therapy as indicated by changes in clinical status/labs    N/V -possibly related to infection? -IVF as above  Diarrhea -new, resolved -c/w IVF -on MMF, will maintain dose for now -further w/u per primary service -of note, diarrhea puts her at risk for supratherapeutic tacrolimus  levels, no signs/symptoms to suggest this at this current moment   UTI -on rocephin  -per primary service   Immunosuppression Management: -maintenance IS: tacrolimus  2mg  BID, mycophenolate  360mg  BID -High Risk Medical Decision Making For Drug Therapy Requiring Intensive Monitoring For Toxicity -I/S levels: FK level pending 12/20. Targeting FK goal of 5-7 -LFTs: WNL 12/20 -Will continue intensive monitoring for drug levels and toxicity via regularly scheduled laboratory assays given the narrow therapeutic index of the immunosuppressive drug therapy  listed above   Supine HTN with chronic hypotension Hypertensive cardiomyopathy -continue with home anti-HTNs for now. Recommend checking orthostats during admit-ordered for daily. C/w IVF -LVOT? Consider cardiology if orthostats do not improve despite fluids   Hypokalemia -replete PRN, doesn't tolerate PO   Anemia -transfuse prn for Hgb <7 -Hgb 10.4   DM2 -mgmt per primary service  Discussed with patient's mom over her speakerphone. Nothing else to add, will sign off. Will need follow up with Dr. Blanch post discharge. Please call with any questions/concerns.  Subjective:   Patient seen and examined bedside. Feels better today, diarrhea resolved   Objective:   BP (!) 152/71 (BP Location: Left Arm)   Pulse 95   Temp 98.4 F (36.9 C) (Oral)   Resp 20   Ht 5' (1.524 m)   Wt 61.7 kg   LMP 07/02/2022   SpO2 95%   BMI 26.57 kg/m   Intake/Output Summary (Last 24 hours) at 06/06/2024 1138 Last data filed at 06/06/2024 9373 Gross per 24 hour  Intake 1766.16 ml  Output --  Net 1766.16 ml   Weight change:   Physical Exam: Gen: NAD CVS: RRR Resp: CTA BL Abd: soft, no tenderness on palpation over allograft side Ext: no edema, right bka Neuro: awake, alert, no tremors Dialysis access: RUE AVF +b/t  Imaging: No results found.   Labs: BMET Recent Labs  Lab 06/03/24 2342 06/04/24 0605 06/04/24 1950 06/04/24 2131 06/05/24 0321 06/05/24 1556 06/05/24 1604 06/06/24 0348  NA 140 139 139 140 140 137  --  139  K 2.7* 3.0* 2.8* 2.8* 2.6* 3.6 3.6 2.9*  CL 89* 93* 95* 96* 99 103  --  105  CO2 28 26 27 26 27  21*  --  24  GLUCOSE 208* 139* 124* 118*  123* 141*  --  126*  BUN 36* 32* 27* 26* 24* 17  --  12  CREATININE 2.48* 2.04* 1.66* 1.58* 1.49* 1.18*  --  1.05*  CALCIUM  9.4 8.8* 9.2 9.0 8.7* 8.4*  --  8.3*   CBC Recent Labs  Lab 06/03/24 2342 06/04/24 0605 06/05/24 0321  WBC 7.7 8.6 6.7  HGB 12.4 11.4* 10.4*  HCT 39.2 36.6 32.4*  MCV 77.9* 80.1 77.3*  PLT  246 213 213    Medications:     amLODipine   5 mg Oral QHS   clopidogrel   75 mg Oral QHS   heparin   5,000 Units Subcutaneous Q8H   insulin  aspart  0-5 Units Subcutaneous QHS   insulin  aspart  0-9 Units Subcutaneous TID WC   metoprolol  succinate  50 mg Oral QHS   mycophenolate   360 mg Oral BID   pantoprazole   40 mg Oral QHS   rosuvastatin   20 mg Oral QHS   tacrolimus   2 mg Oral BID      Ephriam Stank, MD [facility] 06/06/2024, 11:38 AM   "

## 2024-06-06 NOTE — Progress Notes (Signed)
 "          [facility]                                                                              [name], is a 53 y.o. female, DOB - 10-15-1970, FMW:[id number] Admit date - 06/03/2024    Outpatient Primary MD for the patient is [name], [name], MD  LOS - 2  days  Chief Complaint  Patient presents with   Abnormal Lab       Brief summary   Patient is a 53 year old female with ESRD status post renal transplant in 2014., HTN, chronic orthostatic hypotension, recurrent UTIs, sinusitis, IDDM, hyperlipidemia, diabetic neuropathy, retinopathy, severe PAD, right BKA, presented to ED due to abnormal labs.  Patient's PCP had called her for elevated creatinine and low potassium.  Patient reported ongoing dizziness, near syncopal episode when standing up from chair at home. In ED patient noted to have sodium 40, potassium 2.7, chloride 89, BUN 36, creatinine 2.48, magnesium  1.1, CBC unremarkable  CT abdomen pelvis showed no acute findings   Assessment & Plan       AKI (acute kidney injury) - Likely due to hypovolemia, during encounter patient noted to have intractable nausea and vomiting.  Creatinine 2.48 on admission.  Baseline creatinine 1.3-1.6 - Hold Mounjaro, Lasix  (outpatient as needed), avoid nephrotoxic meds - Hold IV fluids, appears to be tolerating diet - CT abdomen pelvis showed no acute findings.  Resolved, creatinine 1.0 - nephrology following   Intractable nausea and vomiting, left-sided flank pain, UTI with?  Pyelonephritis -No acute fevers or abdominal pain.  During encounter, patient noted to have intractable nausea and vomiting -CT abdomen did not show any SBO or acute infectious intra-abdominal pathology - Hold Mounjaro - Continue soft solids - UA positive for UTI, urine culture showed multiple species, continue IV Rocephin  - Continue IV Rocephin   Diarrhea - Resolved, C. difficile, GI pathogen panel negative  Severe hypokalemia, hypomagnesemia -  Likely due to intractable nausea and vomiting, diarrhea - Patient refuses p.o. potassium, continue to replace with IV potassium and IV magnesium   ESRD with history of renal transplant - Tacrolimus  level pending  - Creatinine improved - Continue mycophenolate , tacrolimus     Essential hypertension - Continue Toprol -XL 50 mg daily, amlodipine      Insulin  dependent type 2 diabetes mellitus (HCC) -Hemoglobin A1c 8.3 on 05/10/2024 -Patient has been taking Mounjaro, hold inpatient  CBG (last 3)  Recent Labs    06/05/24 1707 06/05/24 2112 06/06/24 0610  GLUCAP 163* 203* 128*   Continue sliding scale insulin    Severe PAD S/P BKA (below knee amputation) unilateral, right (HCC) - Continue Crestor , Plavix   History of chronic diastolic CHF - Monitor volume status.  GERD - Continue PPI  Estimated body mass index is 26.57 kg/m as calculated from the following:   Height as of this encounter: 5' (1.524 m).   Weight as of this encounter: 61.7 kg.  Code Status: Full code DVT Prophylaxis:  heparin  injection 5,000 Units Start: 06/04/24 0600   Level of Care: Level of care: Progressive Family Communication: Updated patient Disposition Plan:      Remains inpatient appropriate: Plan to DC home tomorrow if  continues to tolerate solid diet, K, magnesium  improving or able to take p.o.   Procedures:    Consultants:   Nephrology  Antimicrobials:   Anti-infectives (From admission, onward)    Start     Dose/Rate Route Frequency Ordered Stop   06/04/24 0600  cefTRIAXone  (ROCEPHIN ) 1 g in sodium chloride  0.9 % 100 mL IVPB        1 g 200 mL/hr over 30 Minutes Intravenous Every 24 hours 06/04/24 0519            Medications  amLODipine   5 mg Oral QHS   clopidogrel   75 mg Oral QHS   heparin   5,000 Units Subcutaneous Q8H   insulin  aspart  0-5 Units Subcutaneous QHS   insulin  aspart  0-9 Units Subcutaneous TID WC   magnesium  oxide  800 mg Oral Once   metoprolol  succinate  50 mg  Oral QHS   mycophenolate   360 mg Oral BID   pantoprazole   40 mg Oral QHS   rosuvastatin   20 mg Oral QHS   tacrolimus   2 mg Oral BID      Subjective:   [name] was seen and examined today.  Feeling a lot better today, eating solids.  K, magnesium  severely low.  States diarrhea has improved, no nausea or vomiting.  No fevers  Objective:   Vitals:   06/05/24 2021 06/05/24 2349 06/06/24 0357 06/06/24 0738  BP: (!) 160/65 (!) 143/48 (!) 159/66 (!) 155/64  Pulse: 83 92 93 95  Resp: 17 20 20 17   Temp: 98.6 F (37 C) 98 F (36.7 C) 97.8 F (36.6 C) 98 F (36.7 C)  TempSrc: Oral Oral Oral Oral  SpO2: 100% 100% 100% 96%  Weight:      Height:        Intake/Output Summary (Last 24 hours) at 06/06/2024 1039 Last data filed at 06/06/2024 9373 Gross per 24 hour  Intake 1766.16 ml  Output --  Net 1766.16 ml     Wt Readings from Last 3 Encounters:  06/04/24 61.7 kg  05/20/24 68.1 kg  05/11/24 67.9 kg   Physical Exam General: Alert and oriented x 3, NAD Cardiovascular: S1 S2 clear, RRR.  Respiratory: CTAB, no wheezing, rales or rhonchi Gastrointestinal: Soft, nontender, nondistended, NBS Ext: Right BKA Neuro: no new deficits Psych: Normal affect       Data Reviewed:  I have personally reviewed following labs    CBC Lab Results  Component Value Date   WBC 6.7 06/05/2024   RBC 4.19 06/05/2024   HGB 10.4 (L) 06/05/2024   HCT 32.4 (L) 06/05/2024   MCV 77.3 (L) 06/05/2024   MCH 24.8 (L) 06/05/2024   PLT 213 06/05/2024   MCHC 32.1 06/05/2024   RDW 14.4 06/05/2024   LYMPHSABS 1.2 05/10/2024   MONOABS 0.7 05/10/2024   EOSABS 0.0 05/10/2024   BASOSABS 0.0 05/10/2024     Last metabolic panel Lab Results  Component Value Date   NA 139 06/06/2024   K 2.9 (L) 06/06/2024   CL 105 06/06/2024   CO2 24 06/06/2024   BUN 12 06/06/2024   CREATININE 1.05 (H) 06/06/2024   GLUCOSE 126 (H) 06/06/2024   GFRNONAA >60 06/06/2024   GFRAA >60 07/28/2019   CALCIUM   8.3 (L) 06/06/2024   PHOS 3.6 12/08/2015   PROT 5.7 (L) 06/05/2024   ALBUMIN  3.5 06/05/2024   BILITOT 0.4 06/05/2024   ALKPHOS 58 06/05/2024   AST 19 06/05/2024   ALT 6 06/05/2024   ANIONGAP 10  06/06/2024    CBG (last 3)  Recent Labs    06/05/24 1707 06/05/24 2112 06/06/24 0610  GLUCAP 163* 203* 128*      Coagulation Profile: No results for input(s): INR, PROTIME in the last 168 hours.   Radiology Studies: I have personally reviewed the imaging studies  No results found.      Nydia Distance M.D. [facility] 06/06/2024, 10:39 AM  Available via Epic secure chat 7am-7pm After 7 pm, please refer to night coverage provider listed on amion.    "

## 2024-06-07 ENCOUNTER — Encounter (HOSPITAL_COMMUNITY): Payer: Self-pay | Admitting: *Deleted

## 2024-06-07 ENCOUNTER — Other Ambulatory Visit (HOSPITAL_COMMUNITY): Payer: Self-pay

## 2024-06-07 DIAGNOSIS — I739 Peripheral vascular disease, unspecified: Secondary | ICD-10-CM

## 2024-06-07 DIAGNOSIS — E7800 Pure hypercholesterolemia, unspecified: Secondary | ICD-10-CM

## 2024-06-07 DIAGNOSIS — Z940 Kidney transplant status: Secondary | ICD-10-CM

## 2024-06-07 DIAGNOSIS — R55 Syncope and collapse: Secondary | ICD-10-CM

## 2024-06-07 LAB — BASIC METABOLIC PANEL WITH GFR
Anion gap: 9 (ref 5–15)
BUN: 7 mg/dL (ref 6–20)
CO2: 20 mmol/L — ABNORMAL LOW (ref 22–32)
Calcium: 8.5 mg/dL — ABNORMAL LOW (ref 8.9–10.3)
Chloride: 108 mmol/L (ref 98–111)
Creatinine, Ser: 1.08 mg/dL — ABNORMAL HIGH (ref 0.44–1.00)
GFR, Estimated: >60 mL/min
Glucose, Bld: 138 mg/dL — ABNORMAL HIGH (ref 70–99)
Potassium: 3.8 mmol/L (ref 3.5–5.1)
Sodium: 137 mmol/L (ref 135–145)

## 2024-06-07 LAB — GLUCOSE, CAPILLARY: Glucose-Capillary: 128 mg/dL — ABNORMAL HIGH (ref 70–99)

## 2024-06-07 LAB — MAGNESIUM: Magnesium: 2.4 mg/dL (ref 1.7–2.4)

## 2024-06-07 MED ORDER — PROCHLORPERAZINE MALEATE 10 MG PO TABS
10.0000 mg | ORAL_TABLET | Freq: Four times a day (QID) | ORAL | 0 refills | Status: AC | PRN
  Filled 2024-06-07: qty 30, 8d supply, fill #0

## 2024-06-07 MED ORDER — MAGNESIUM OXIDE 400 MG PO TABS
400.0000 mg | ORAL_TABLET | Freq: Every day | ORAL | 0 refills | Status: AC
  Filled 2024-06-07: qty 7, 7d supply, fill #0

## 2024-06-07 MED ORDER — METOPROLOL SUCCINATE ER 50 MG PO TB24
50.0000 mg | ORAL_TABLET | Freq: Every day | ORAL | 3 refills | Status: DC
  Filled 2024-06-07: qty 30, 30d supply, fill #0

## 2024-06-07 MED ORDER — CEPHALEXIN 500 MG PO CAPS
500.0000 mg | ORAL_CAPSULE | Freq: Two times a day (BID) | ORAL | 0 refills | Status: AC
  Filled 2024-06-07: qty 6, 3d supply, fill #0

## 2024-06-07 NOTE — Discharge Summary (Signed)
 " Physician Discharge Summary   Patient: [name] MRN: [id number] DOB: 1971-02-04  Admit date:     06/03/2024  Discharge date: 06/07/2024  Discharge Physician: Nydia Distance, MD    PCP: Catalina Bare, MD   Recommendations at discharge:   Keflex  500 mg p.o. twice daily for 3 more days Outpatient follow-up with nephrology in 7 to 10 days, repeat BMP for potassium, magnesium   Hold Mounjaro until follow-up with PCP and resume only if instructed  Discharge Diagnoses:    AKI (acute kidney injury)  UTI with probable pyelonephritis   Hypokalemia   Essential hypertension   Insulin  dependent type 2 diabetes mellitus (HCC)   S/P BKA (below knee amputation) unilateral, right (HCC)   Nausea & vomiting-resolved   Kidney transplant status   Hypomagnesemia    Hospital Course: Patient is a 53 year old female with ESRD status post renal transplant in 2014., HTN, chronic orthostatic hypotension, recurrent UTIs, sinusitis, IDDM, hyperlipidemia, diabetic neuropathy, retinopathy, severe PAD, right BKA, presented to ED due to abnormal labs.  Patient's PCP had called her for elevated creatinine and low potassium.  Patient reported ongoing dizziness, near syncopal episode when standing up from chair at home. In ED patient noted to have sodium 40, potassium 2.7, chloride 89, BUN 36, creatinine 2.48, magnesium  1.1, CBC unremarkable   CT abdomen pelvis showed no acute findings    Assessment and Plan:   AKI (acute kidney injury) - Likely due to hypovolemia, during encounter patient noted to have intractable nausea and vomiting.  Creatinine 2.48 on admission.  Baseline creatinine 1.3-1.6 - Hold Mounjaro, Lasix  (outpatient as needed), avoid nephrotoxic meds - Hold IV fluids, appears to be tolerating diet - CT abdomen pelvis showed no acute findings.  Resolved, creatinine 1.0 - Nephrology was consulted.  Creatinine now at baseline, 1.0 at discharge.     Intractable nausea and vomiting,  left-sided flank pain, UTI with possible pyelonephritis -No acute fevers or abdominal pain.  During encounter, patient noted to have intractable nausea and vomiting -CT abdomen did not show any SBO or acute infectious intra-abdominal pathology - Hold Mounjaro - UA positive for UTI, urine culture showed multiple species, patient was placed on IV Rocephin .  -Now tolerating solid diet without difficulty, no nausea vomiting - Transition to oral Keflex  for 3 more days to complete full course for UTI with probable pyelonephritis   Diarrhea - Resolved, C. difficile, GI pathogen panel negative   Severe hypokalemia, hypomagnesemia - Likely due to intractable nausea and vomiting, diarrhea - Improved, K3.8, magnesium  2.4 at discharge -Please repeat BMP at follow-up   ESRD with history of renal transplant - Tacrolimus  level  9.4 - Creatinine improved - Continue mycophenolate , tacrolimus      Essential hypertension - Continue Toprol -XL 50 mg daily, amlodipine       Insulin  dependent type 2 diabetes mellitus (HCC) -Hemoglobin A1c 8.3 on 05/10/2024 -Patient has been taking Mounjaro outpatient.  Euvolemic -Patient was placed on sliding scale insulin  while inpatient.  Follow-up outpatient with PCP and adjust medications.  Due to severe nausea vomiting, diarrhea, AKI, Mounjaro was held  -Continue Lantus , sliding scale insulin    Severe PAD S/P BKA (below knee amputation) unilateral, right (HCC) - Continue Crestor , Plavix    History of chronic diastolic CHF - Euvolemic, Stable   GERD - Continue PPI   Estimated body mass index is 26.57 kg/m as calculated from the following:   Height as of this encounter: 5' (1.524 m).   Weight as of this encounter: 61.7 kg.  Pain control - [place]  Controlled Substance Reporting System database was reviewed. and patient was instructed, not to drive, operate heavy machinery, perform activities at heights, swimming or participation in water  activities or  provide baby-sitting services while on Pain, Sleep and Anxiety Medications; until their outpatient Physician has advised to do so again. Also recommended to not to take more than prescribed Pain, Sleep and Anxiety Medications.  Consultants: Nephrology Procedures performed: None  Disposition: Home Diet recommendation: Soft diet, carb modified  DISCHARGE MEDICATION: Allergies as of 06/07/2024       Reactions   Fish Allergy Anaphylaxis   Transplant donor allergic to all seafood, and patient instructed not to eat.    Shellfish Allergy Anaphylaxis   Transplant donor was allergic and patient instructed not to eat any type of seafood.        Medication List     PAUSE taking these medications    tirzepatide 10 MG/0.5ML Pen Wait to take this until your doctor or other care provider tells you to start again. Commonly known as: MOUNJARO Inject 10 mg into the skin every Wednesday.       TAKE these medications    amLODipine  5 MG tablet Commonly known as: NORVASC  Take 1 tablet (5 mg total) by mouth at bedtime.   cephALEXin  500 MG capsule Commonly known as: KEFLEX  Take 1 capsule (500 mg total) by mouth 2 (two) times daily for 3 days. Start taking on: June 08, 2024   clopidogrel  75 MG tablet Commonly known as: PLAVIX  Take 1 tablet (75 mg total) by mouth daily. What changed: when to take this   furosemide  40 MG tablet Commonly known as: LASIX  Take 1 tablet (40 mg total) by mouth daily as needed for edema or fluid.   HumaLOG KwikPen 100 UNIT/ML KwikPen Generic drug: insulin  lispro Inject 0-5 Units into the skin 3 (three) times daily with meals as needed (DMII).   Lantus  SoloStar 100 UNIT/ML Solostar Pen Generic drug: insulin  glargine Inject 10 Units into the skin 2 (two) times daily.   magnesium  oxide 400 MG tablet Commonly known as: MAG-OX Take 1 tablet (400 mg total) by mouth daily for 7 days.   Melatonin 10 MG Tabs Take 20 mg by mouth at bedtime.   methenamine 1  g tablet Commonly known as: HIPREX Take 1 g by mouth 2 (two) times daily with a meal.   metoprolol  succinate 50 MG 24 hr tablet Commonly known as: TOPROL -XL Take 1 tablet (50 mg total) by mouth at bedtime. What changed:  medication strength how much to take   mycophenolate  180 MG EC tablet Commonly known as: MYFORTIC  Take 360 mg by mouth in the morning and at bedtime. (360mg ) in the morning and bedtime-( capsules )   pantoprazole  40 MG tablet Commonly known as: PROTONIX  Take 40 mg by mouth at bedtime.   prochlorperazine  10 MG tablet Commonly known as: COMPAZINE  Take 1 tablet (10 mg total) by mouth every 6 (six) hours as needed for nausea or vomiting.   promethazine  12.5 MG tablet Commonly known as: PHENERGAN  Take 12.5 mg by mouth every 8 (eight) hours as needed for nausea.   rosuvastatin  20 MG tablet Commonly known as: CRESTOR  Take 1 tablet (20 mg total) by mouth daily. What changed: when to take this   tacrolimus  1 MG capsule Commonly known as: PROGRAF  Take 2 mg by mouth in the morning and at bedtime.        Follow-up Information     Tobie Gordy POUR,  MD. Schedule an appointment as soon as possible for a visit in 1 week(s).   Specialties: Nephrology, Vascular Surgery Why: for hospital follow-up, obtain labs BMP Contact information: [address] [place] [address] [phone]         Catalina Bare, MD. Schedule an appointment as soon as possible for a visit in 2 week(s).   Specialty: Internal Medicine Why: for hospital follow-up Contact information: [address] [place] [address] [phone]                Discharge Exam: Fredricka Weights   06/03/24 2331 06/04/24 2101  Weight: 64.4 kg 61.7 kg   S: No acute complaints, feels a lot better today, tolerating solid diet without difficulty.  Feels ready to go home.  BP (!) 144/61 (BP Location: Left Arm)   Pulse 91   Temp 98.3 F (36.8 C) (Oral)   Resp 18   Ht 5' (1.524 m)    Wt 61.7 kg   LMP 07/02/2022   SpO2 99%   BMI 26.57 kg/m   Physical Exam General: Alert and oriented x 3, NAD Cardiovascular: S1 S2 clear, RRR.  Respiratory: CTAB, no wheezing, rales or rhonchi Gastrointestinal: Soft, nontender, nondistended, NBS Ext: no pedal edema bilaterally Neuro: no new deficits Psych: Normal affect    Condition at discharge: fair  The results of significant diagnostics from this hospitalization (including imaging, microbiology, ancillary and laboratory) are listed below for reference.   Imaging Studies: CT ABDOMEN PELVIS WO CONTRAST Result Date: 06/04/2024 EXAM: CT ABDOMEN AND PELVIS WITHOUT CONTRAST 06/04/2024 12:30:46 AM TECHNIQUE: CT of the abdomen and pelvis was performed without the administration of intravenous contrast. Multiplanar reformatted images are provided for review. Automated exposure control, iterative reconstruction, and/or weight-based adjustment of the mA/kV was utilized to reduce the radiation dose to as low as reasonably achievable. COMPARISON: None available. CLINICAL HISTORY: Abdominal pain, acute, nonlocalized. FINDINGS: LOWER CHEST: Small pericardial effusion, similar to the number 245. Moderate multivessel coronary artery calcifications. LIVER: The liver is unremarkable. GALLBLADDER AND BILE DUCTS: Status post cholecystectomy. No biliary ductal dilatation. SPLEEN: No acute abnormality. PANCREAS: No acute abnormality. ADRENAL GLANDS: No acute abnormality. KIDNEYS, URETERS AND BLADDER: The native kidneys are markedly atrophic. Right iliac fossa transplant kidney noted. Simple cortical cyst noted involving the transplant kidney. Per consensus, no follow-up is needed for simple Bosniak type 1 and 2 renal cysts, unless the patient has a malignancy history or risk factors. No hydronephrosis. No intrarenal or ureteral calculi. The bladder is unremarkable. GI AND BOWEL: Stomach demonstrates no acute abnormality. Appendix normal. There is no bowel  obstruction. PERITONEUM AND RETROPERITONEUM: No ascites. No free air. VASCULATURE: Aorta is normal in caliber. Moderate aortoiliac atherosclerotic calcification. Right common and external iliac artery stenting has been performed status post proximal and distal to the transplant renal artery anastomosis. Patency is not well assessed on this noncontrast examination. LYMPH NODES: No lymphadenopathy. REPRODUCTIVE ORGANS: No acute abnormality. BONES AND SOFT TISSUES: No acute osseous abnormality. Tiny fat-containing umbilical hernia. IMPRESSION: 1. No acute findings in the abdomen or pelvis. 2. Status post renal transplantation with unremarkable appearance of right iliac fossa transplant kidney. 3. Small pericardial effusion, similar to prior. 4. Moderate multivessel coronary artery calcifications. Electronically signed by: Dorethia Molt MD 06/04/2024 12:48 AM EST RP Workstation: [id number]   DG Chest Port 1 View Result Date: 06/04/2024 EXAM: 1 VIEW(S) XRAY OF THE CHEST 06/04/2024 12:07:00 AM COMPARISON: 05/10/2024 CLINICAL HISTORY: dizzy FINDINGS: LUNGS AND PLEURA: No focal pulmonary  opacity. No pleural effusion. No pneumothorax. HEART AND MEDIASTINUM: No acute abnormality of the cardiac and mediastinal silhouettes. BONES AND SOFT TISSUES: Right upper quadrant surgical clips noted. Stable left breast calcifications are noted. No acute osseous abnormality. IMPRESSION: 1. No acute findings. Electronically signed by: Oneil Devonshire MD 06/04/2024 12:23 AM EST RP Workstation: [id number]   ECHOCARDIOGRAM COMPLETE Result Date: 05/11/2024    ECHOCARDIOGRAM REPORT   Patient Name:   VICKII VOLLAND Date of Exam: 05/11/2024 Medical Rec #:  [id number]       Height:       60.0 in Accession #:    [id number]      Weight:       147.0 lb Date of Birth:  Jan 23, 1971       BSA:          1.638 m Patient Age:    53 years        BP:           156/73 mmHg Patient Gender: F               HR:           103 bpm. Exam Location:  Inpatient  Procedure: 2D Echo and Intracardiac Opacification Agent (Both Spectral and Color            Flow Doppler were utilized during procedure). Indications:    Syncope  History:        Patient has prior history of Echocardiogram examinations.  Sonographer:    Charmaine Gaskins Referring Phys: [id number] SUBRINA SUNDIL IMPRESSIONS  1. Severe concentric LVH up to 1.6 cm. Hyperdynamic LV function. Chordal SAM is present with resting gradient 42 mmHG, up to 107 mmHG with valsalva. Findings are concerning for hypertrophic cardiomyopathy with outflow obstruction. Would recommend cardiac MRI for further characterization. Left ventricular ejection fraction, by estimation, is 70 to 75%. The left ventricle has hyperdynamic function. The left ventricle has no regional wall motion abnormalities. There is severe concentric left ventricular hypertrophy. Left ventricular diastolic parameters are consistent with Grade I diastolic dysfunction (impaired relaxation).  2. Right ventricular systolic function is normal. The right ventricular size is normal. Tricuspid regurgitation signal is inadequate for assessing PA pressure.  3. A small pericardial effusion is present. The pericardial effusion is posterior to the left ventricle. There is no evidence of cardiac tamponade.  4. The mitral valve is grossly normal. Trivial mitral valve regurgitation. No evidence of mitral stenosis.  5. The aortic valve is tricuspid. Aortic valve regurgitation is not visualized. No aortic stenosis is present.  6. The inferior vena cava is normal in size with greater than 50% respiratory variability, suggesting right atrial pressure of 3 mmHg. FINDINGS  Left Ventricle: Severe concentric LVH up to 1.6 cm. Hyperdynamic LV function. Chordal SAM is present with resting gradient 42 mmHG, up to 107 mmHG with valsalva. Findings are concerning for hypertrophic cardiomyopathy with outflow obstruction. Would recommend cardiac MRI for further characterization. Left ventricular  ejection fraction, by estimation, is 70 to 75%. The left ventricle has hyperdynamic function. The left ventricle has no regional wall motion abnormalities. Definity  contrast agent was given IV to delineate the left ventricular endocardial borders. The left ventricular internal cavity size was normal in size. There is severe concentric left ventricular hypertrophy. Left ventricular diastolic parameters are consistent with Grade I diastolic dysfunction (impaired relaxation). Right Ventricle: The right ventricular size is normal. No increase in right ventricular wall thickness. Right ventricular systolic function is normal. Tricuspid regurgitation signal is  inadequate for assessing PA pressure. Left Atrium: Left atrial size was normal in size. Right Atrium: Right atrial size was normal in size. Pericardium: A small pericardial effusion is present. The pericardial effusion is posterior to the left ventricle. There is no evidence of cardiac tamponade. Mitral Valve: The mitral valve is grossly normal. Trivial mitral valve regurgitation. No evidence of mitral valve stenosis. Tricuspid Valve: The tricuspid valve is grossly normal. Tricuspid valve regurgitation is trivial. No evidence of tricuspid stenosis. Aortic Valve: The aortic valve is tricuspid. Aortic valve regurgitation is not visualized. No aortic stenosis is present. Pulmonic Valve: The pulmonic valve was grossly normal. Pulmonic valve regurgitation is trivial. No evidence of pulmonic stenosis. Aorta: The aortic root and ascending aorta are structurally normal, with no evidence of dilitation. Venous: The right lower pulmonary vein is normal. The inferior vena cava is normal in size with greater than 50% respiratory variability, suggesting right atrial pressure of 3 mmHg. IAS/Shunts: The atrial septum is grossly normal.  LEFT VENTRICLE PLAX 2D LVIDd:         2.60 cm   Diastology LVIDs:         1.60 cm   LV e' medial:    7.46 cm/s LV PW:         1.60 cm   LV E/e'  medial:  8.2 LV IVS:        1.60 cm   LV e' lateral:   10.90 cm/s LVOT diam:     2.00 cm   LV E/e' lateral: 5.6 LVOT Area:     3.14 cm  RIGHT VENTRICLE RV Basal diam:  1.90 cm RV Mid diam:    1.60 cm RV S prime:     19.40 cm/s LEFT ATRIUM             Index        RIGHT ATRIUM          Index LA diam:        2.90 cm 1.77 cm/m   RA Area:     9.97 cm LA Vol (A2C):   25.2 ml 15.39 ml/m  RA Volume:   18.90 ml 11.54 ml/m LA Vol (A4C):   30.9 ml 18.87 ml/m LA Biplane Vol: 30.5 ml 18.62 ml/m   AORTA Ao Root diam: 2.60 cm Ao Asc diam:  2.70 cm MITRAL VALVE MV Area (PHT): 4.41 cm    SHUNTS MV Decel Time: 172 msec    Systemic Diam: 2.00 cm MV E velocity: 61.30 cm/s MV A velocity: 97.90 cm/s MV E/A ratio:  0.63 Darryle Decent MD Electronically signed by Darryle Decent MD Signature Date/Time: 05/11/2024/11:47:26 AM    Final    EEG adult Result Date: 05/11/2024 Gregg Lek, MD     05/11/2024  9:34 AM Patient Name: MAHOGANIE BASHER MRN: [id number] Epilepsy Attending: Lek Gregg Referring Physician/Provider: No ref. provider found     Date: 05/11/2024 Duration: 22 minutes Patient history: 53 year old woman with syncope vs. Seizure Level of alertness: Awake, drowsy, sleep AEDs during EEG study: None Technical aspects: This EEG study was done with scalp electrodes positioned according to the 10-20 International system of electrode placement. Electrical activity was reviewed with band pass filter of 1-70Hz , sensitivity of 7 uV/mm, display speed of 57mm/sec with a 60Hz  notched filter applied as appropriate. EEG data were recorded continuously and digitally stored.  Video monitoring was available and reviewed as appropriate. Description: The posterior dominant rhythm consists of 10 Hz activity of moderate voltage (25-35  uV) seen predominantly in posterior head regions, symmetric and reactive to eye opening and eye closing. Drowsiness was characterized by attenuation of the posterior background rhythm. Sleep was  characterized by vertex waves, sleep spindles (12 to 14 Hz), maximal frontocentral region.  Hyperventilation and photic stimulation were not performed.   ABNORMALITY -None IMPRESSION: This study is within normal limits. No seizures or epileptiform discharges were seen throughout the recording. A normal interictal EEG does not exclude nor support the diagnosis of epilepsy. Pastor Falling MD Neurology    CT CHEST ABDOMEN PELVIS WO CONTRAST Result Date: 05/10/2024 EXAM: CT CHEST, ABDOMEN AND PELVIS WITHOUT CONTRAST 05/10/2024 07:26:19 PM TECHNIQUE: CT of the chest, abdomen and pelvis was performed without the administration of intravenous contrast. Multiplanar reformatted images are provided for review. Automated exposure control, iterative reconstruction, and/or weight based adjustment of the mA/kV was utilized to reduce the radiation dose to as low as reasonably achievable. COMPARISON: 04/02/2015 CLINICAL HISTORY: Chest and abdominal pain. FINDINGS: LIMITATIONS: Somewhat limited due to lack of IV contrast. CHEST: MEDIASTINUM AND LYMPH NODES: Heart is at the upper limits of normal in size. Minimal pericardial effusion is seen. Mild coronary calcifications are seen. The central airways are clear. No mediastinal, hilar or axillary lymphadenopathy. The esophagus, as visualized, is within normal limits. Thoracic inlet is within normal limits. LUNGS AND PLEURA: The lungs are well aerated bilaterally. No focal infiltrate or pulmonary edema. No pleural effusion or pneumothorax. No sizable parenchymal nodule is seen. ABDOMEN AND PELVIS: LIVER: The liver is within normal limits. GALLBLADDER AND BILE DUCTS: The gallbladder has been surgically removed. No biliary ductal dilatation. SPLEEN: The spleen is unremarkable. PANCREAS: The pancreas is unremarkable. ADRENAL GLANDS: The adrenal glands are within normal limits. KIDNEYS, URETERS AND BLADDER: The native kidneys are small. No obstructive changes are seen in the native  kidneys. A right iliac fossa renal transplant is seen without obstructive change. No stones in the kidneys or ureters. The bladder is unremarkable. GI AND BOWEL: Stomach and small bowel are within normal limits. Scattered fecal material is noted throughout the colon. No obstructive or inflammatory changes are noted in the colon. The appendix is within normal limits. REPRODUCTIVE ORGANS: The uterus is within normal limits. PERITONEUM AND RETROPERITONEUM: No free fluid is seen. No ascites. No free air. VASCULATURE: Atherosclerotic calcifications of the thoracic aorta are noted. Vascular calcifications are seen as well as diffuse arterial stenting of the right iliac system. ABDOMINAL AND PELVIS LYMPH NODES: No lymphadenopathy. BONES AND SOFT TISSUES: Osseous structures of the chest are within normal limits. No rib abnormality is seen. No bony abnormality is noted in the abdomen and pelvis. No focal soft tissue abnormality. IMPRESSION: 1. No acute findings in the chest, abdomen, and pelvis. 2. Small kidneys consistent with renal disease. No obstructive changes. 3. Right iliac fossa renal transplant without obstructive change. Electronically signed by: Oneil Devonshire MD 05/10/2024 08:00 PM EST RP Workstation: [id number]   CT Head Wo Contrast Result Date: 05/10/2024 EXAM: CT HEAD WITHOUT CONTRAST 05/10/2024 07:26:19 PM TECHNIQUE: CT of the head was performed without the administration of intravenous contrast. Automated exposure control, iterative reconstruction, and/or weight based adjustment of the mA/kV was utilized to reduce the radiation dose to as low as reasonably achievable. COMPARISON: 06/28/2018 CLINICAL HISTORY: Syncope/presyncope, cerebrovascular cause suspected. FINDINGS: BRAIN AND VENTRICLES: No acute hemorrhage. No evidence of acute infarct. No hydrocephalus. No extra-axial collection. No mass effect or midline shift. ORBITS: No acute abnormality. SINUSES: No acute abnormality. SOFT TISSUES AND SKULL: No  acute soft tissue abnormality. No skull fracture. IMPRESSION: 1. No acute intracranial abnormality. Electronically signed by: Franky Crease MD 05/10/2024 07:56 PM EST RP Workstation: [id number]   DG Chest Portable 1 View Result Date: 05/10/2024 CLINICAL DATA:  Clemens, syncope EXAM: PORTABLE CHEST 1 VIEW COMPARISON:  07/28/2019 FINDINGS: Single frontal view of the chest demonstrates an unremarkable cardiac silhouette. No acute airspace disease, effusion, or pneumothorax. No acute bony abnormalities. Rounded calcification overlying the left anterior sixth rib may reflect soft tissue calcification within the left breast versus underlying left lower lobe granuloma. IMPRESSION: 1. No acute intrathoracic process. Electronically Signed   By: Ozell Daring M.D.   On: 05/10/2024 15:12    Microbiology: Results for orders placed or performed during the hospital encounter of 06/03/24  Resp panel by RT-PCR (RSV, Flu A&B, Covid) Anterior Nasal Swab     Status: None   Collection Time: 06/03/24 11:43 PM   Specimen: Anterior Nasal Swab  Result Value Ref Range Status   SARS Coronavirus 2 by RT PCR NEGATIVE NEGATIVE Final   Influenza A by PCR NEGATIVE NEGATIVE Final   Influenza B by PCR NEGATIVE NEGATIVE Final    Comment: (NOTE) The Xpert Xpress SARS-CoV-2/FLU/RSV plus assay is intended as an aid in the diagnosis of influenza from Nasopharyngeal swab specimens and should not be used as a sole basis for treatment. Nasal washings and aspirates are unacceptable for Xpert Xpress SARS-CoV-2/FLU/RSV testing.  Fact Sheet for Patients: bloggercourse.com  Fact Sheet for Healthcare Providers: seriousbroker.it  This test is not yet approved or cleared by the [place]  FDA and has been authorized for detection and/or diagnosis of SARS-CoV-2 by FDA under an Emergency Use Authorization (EUA). This EUA will remain in effect (meaning this test can be used) for the  duration of the COVID-19 declaration under Section 564(b)(1) of the Act, 21 U.S.C. section 360bbb-3(b)(1), unless the authorization is terminated or revoked.     Resp Syncytial Virus by PCR NEGATIVE NEGATIVE Final    Comment: (NOTE) Fact Sheet for Patients: bloggercourse.com  Fact Sheet for Healthcare Providers: seriousbroker.it  This test is not yet approved or cleared by the [place]  FDA and has been authorized for detection and/or diagnosis of SARS-CoV-2 by FDA under an Emergency Use Authorization (EUA). This EUA will remain in effect (meaning this test can be used) for the duration of the COVID-19 declaration under Section 564(b)(1) of the Act, 21 U.S.C. section 360bbb-3(b)(1), unless the authorization is terminated or revoked.  Performed at [facility], [address] [place], [place] [address]   Urine Culture (for pregnant, neutropenic or urologic patients or patients with an indwelling urinary catheter)     Status: Abnormal   Collection Time: 06/04/24  5:25 AM   Specimen: Urine, Clean Catch  Result Value Ref Range Status   Specimen Description URINE, CLEAN CATCH  Final   Special Requests   Final    NONE Performed at [facility], [address] [place], [place] [address]    Culture MULTIPLE SPECIES PRESENT, SUGGEST RECOLLECTION (A)  Final   Report Status 06/05/2024 FINAL  Final  C Difficile Quick Screen w PCR reflex     Status: None   Collection Time: 06/05/24 12:03 PM   Specimen: STOOL  Result Value Ref Range Status   C Diff antigen NEGATIVE NEGATIVE Final   C Diff toxin NEGATIVE NEGATIVE Final   C Diff interpretation No C. difficile detected.  Final    Comment: Performed at [facility]  Lab, [address] [place], [place] [address]  Gastrointestinal Panel by PCR , Stool     Status: None   Collection Time: 06/05/24 12:03 PM   Specimen: Stool  Result Value Ref Range Status   Campylobacter  species NOT DETECTED NOT DETECTED Final   Plesimonas shigelloides NOT DETECTED NOT DETECTED Final   Salmonella species NOT DETECTED NOT DETECTED Final   Yersinia enterocolitica NOT DETECTED NOT DETECTED Final   Vibrio species NOT DETECTED NOT DETECTED Final   Vibrio cholerae NOT DETECTED NOT DETECTED Final   Enteroaggregative E coli (EAEC) NOT DETECTED NOT DETECTED Final   Enteropathogenic E coli (EPEC) NOT DETECTED NOT DETECTED Final   Enterotoxigenic E coli (ETEC) NOT DETECTED NOT DETECTED Final   Shiga like toxin producing E coli (STEC) NOT DETECTED NOT DETECTED Final   Shigella/Enteroinvasive E coli (EIEC) NOT DETECTED NOT DETECTED Final   Cryptosporidium NOT DETECTED NOT DETECTED Final   Cyclospora cayetanensis NOT DETECTED NOT DETECTED Final   Entamoeba histolytica NOT DETECTED NOT DETECTED Final   Giardia lamblia NOT DETECTED NOT DETECTED Final   Adenovirus F40/41 NOT DETECTED NOT DETECTED Final   Astrovirus NOT DETECTED NOT DETECTED Final   Norovirus GI/GII NOT DETECTED NOT DETECTED Final   Rotavirus A NOT DETECTED NOT DETECTED Final   Sapovirus (I, II, IV, and V) NOT DETECTED NOT DETECTED Final    Comment: Performed at [facility], [address] [place], [place] [address]    Labs: CBC: Recent Labs  Lab 06/03/24 2342 06/04/24 0605 06/05/24 0321  WBC 7.7 8.6 6.7  HGB 12.4 11.4* 10.4*  HCT 39.2 36.6 32.4*  MCV 77.9* 80.1 77.3*  PLT 246 213 213   Basic Metabolic Panel: Recent Labs  Lab 06/04/24 0605 06/04/24 1950 06/04/24 2131 06/05/24 0321 06/05/24 1556 06/05/24 1604 06/06/24 0348 06/07/24 0606  NA 139 139 140 140 137  --  139 137  K 3.0* 2.8* 2.8* 2.6* 3.6 3.6 2.9* 3.8  CL 93* 95* 96* 99 103  --  105 108  CO2 26 27 26 27  21*  --  24 20*  GLUCOSE 139* 124* 118* 123* 141*  --  126* 138*  BUN 32* 27* 26* 24* 17  --  12 7  CREATININE 2.04* 1.66* 1.58* 1.49* 1.18*  --  1.05* 1.08*  CALCIUM  8.8* 9.2 9.0 8.7* 8.4*  --  8.3* 8.5*  MG 2.1 2.6* 2.5*   --   --   --  1.5* 2.4   Liver Function Tests: Recent Labs  Lab 06/03/24 2342 06/05/24 0321  AST 25 19  ALT 8 6  ALKPHOS 71 58  BILITOT 0.6 0.4  PROT 7.2 5.7*  ALBUMIN  4.2 3.5   CBG: Recent Labs  Lab 06/06/24 0610 06/06/24 1130 06/06/24 1545 06/06/24 2109 06/07/24 0609  GLUCAP 128* 179* 175* 179* 128*    Discharge time spent: greater than 30 minutes.  Signed: Nydia Distance, MD [facility] 06/07/2024 "

## 2024-06-07 NOTE — TOC CM/SW Note (Signed)
 Transition of Care [facility]) - Inpatient Brief Assessment   Patient Details  Name: [name] MRN: [id number] Date of Birth: 1970/10/19  Transition of Care [facility]) CM/SW Contact:    Waddell Barnie Rama, RN Phone Number: 06/07/2024, 8:57 AM   Clinical Narrative: From home with mother, has PCP and insurance on file, states has no HH services in place at this time or DME at home.  States she will need a cab for transport home, them home at [employer] and family is support system, states gets medications from [facility].  Pta self ambulatory.   There are no ICM needs  identified  at this time.  Please place consult for ICM needs.     Transition of Care Asessment: Insurance and Status: Insurance coverage has been reviewed Patient has primary care physician: Yes Home environment has been reviewed: home with mother Prior level of function:: indep Prior/Current Home Services: No current home services Social Drivers of Health Review: SDOH reviewed no interventions necessary Readmission risk has been reviewed: Yes Transition of care needs: no transition of care needs at this time

## 2024-06-07 NOTE — TOC Transition Note (Signed)
 Transition of Care [facility]) - Discharge Note   Patient Details  Name: [name] MRN: [id number] Date of Birth: 17-Apr-1971  Transition [facility] Care [facility]) CM/SW Contact:  Waddell Barnie Rama, RN Phone Number: 06/07/2024, 9:00 AM   Clinical Narrative:    For dc today, she will need cab transport in [place] [employer].          Patient Goals and CMS Choice            Discharge Placement                       Discharge Plan and Services Additional resources added to the After Visit Summary for                                       Social Drivers of Health (SDOH) Interventions SDOH Screenings   Food Insecurity: No Food Insecurity (06/04/2024)  Housing: Unknown (06/04/2024)  Transportation Needs: No Transportation Needs (06/04/2024)  Utilities: Not At Risk (06/04/2024)  Tobacco Use: Low Risk (06/03/2024)     Readmission Risk Interventions    06/07/2024    8:57 AM  Readmission Risk Prevention Plan  Transportation Screening Complete  PCP or Specialist Appt within 3-5 Days Complete  HRI or Home Care Consult Complete  Palliative Care Screening Not Applicable  Medication Review (RN Care Manager) Complete

## 2024-06-08 ENCOUNTER — Telehealth: Payer: Self-pay

## 2024-06-08 LAB — TACROLIMUS LEVEL: Tacrolimus (FK506) - LabCorp: 8 ng/mL (ref 5.0–20.0)

## 2024-06-08 NOTE — Patient Instructions (Signed)
 Visit Information  Thank you for taking time to visit with me today. Please don't hesitate to contact me if I can be of assistance to you before our next scheduled telephone appointment.  Our next appointment is by telephone on 06/22/24 at 1000 am  Following is a copy of your care plan:   Goals Addressed             This Visit's Progress    VBCI Transitions of Care (TOC) Care Plan       Problems:  Recent Hospitalization for treatment of Acute Kidney Injury/UTI Knowledge Deficit Related to Acute Kidney Injury/ UTI  Goal:  Over the next 30 days, the patient will not experience hospital readmission  Interventions:  Transitions of Care: Doctor Visits  - discussed the importance of doctor visits Communication with PCP re: Enrollment in 30 day TOC program Reviewed Signs and symptoms of infection Reviewed discharge instructions  Patient Self Care Activities:  Attend all scheduled provider appointments Call pharmacy for medication refills 3-7 days in advance of running out of medications Call provider office for new concerns or questions  Notify RN Care Manager of TOC call rescheduling needs Participate in Transition of Care Program/Attend TOC scheduled calls Take medications as prescribed   Check blood sugar at least 3 times per day.  Drink plenty of water  or other fluids. Take antibiotics exactly as your healthcare provider tells you. Monitor for fever, chills, increased urination, burning on urination, blood in urine, nausea, vomiting, or pain in abdomen  Plan:  The patient has been provided with contact information for the care management team and has been advised to call with any health related questions or concerns.         Patient verbalizes understanding of instructions and care plan provided today and agrees to view in MyChart. Active MyChart status and patient understanding of how to access instructions and care plan via MyChart confirmed with patient.     The  patient has been provided with contact information for the care management team and has been advised to call with any health related questions or concerns.   Please call the care guide team at [phone] if you need to cancel or reschedule your appointment.   Please call the Suicide and Crisis Lifeline: 988 call the [place]  National Suicide Prevention Lifeline: 249-304-0024 or TTY: (506) 798-3592 TTY 480-822-6010) to talk to a trained counselor if you are experiencing a Mental Health or Behavioral Health Crisis or need someone to talk to.  Miasia Crabtree J. Nyaja Dubuque RN, MSN [facility], [facility] Health RN Care Manager Direct Dial: [phone]  Fax: [phone] Website: [email or web address]

## 2024-06-08 NOTE — Transitions of Care (Post Inpatient/ED Visit) (Signed)
 "  06/08/2024  Name: [name] MRN: [id number] DOB: Apr 16, 1971  Today's TOC FU Call Status: Today's TOC FU Call Status:: Successful TOC FU Call Completed TOC FU Call Complete Date: 06/08/24  Patient's Name and Date of Birth confirmed. Name, DOB  Transition Care Management Follow-up Telephone Call Date of Discharge: 06/07/24 Discharge Facility: [facility]) Type of Discharge: Inpatient Admission Primary Inpatient Discharge Diagnosis:: AKI (acute kidney injury) How have you been since you were released from the hospital?: Better Any questions or concerns?: No  Items Reviewed: Did you receive and understand the discharge instructions provided?: Yes Medications obtained,verified, and reconciled?: Yes (Medications Reviewed) Any new allergies since your discharge?: No Dietary orders reviewed?: Yes Type of Diet Ordered:: Heart healthy carb modified Do you have support at home?: Yes People in Home [RPT]: parent(s) Name of Support/Comfort Primary Source: [place]  Medications Reviewed Today: Medications Reviewed Today     Reviewed by Lily Velasquez, RN (Case Manager) on 06/08/24 at [phone]  Med List Status: <None>   Medication Order Taking? Sig Documenting Provider Last Dose Status Informant  amLODipine  (NORVASC ) 5 MG tablet [id number] Yes Take 1 tablet (5 mg total) by mouth at bedtime. [name], MD  Active Self, Pharmacy Records  cephALEXin  (KEFLEX ) 500 MG capsule [id number] Yes Take 1 capsule (500 mg total) by mouth 2 (two) times daily for 3 days. Rai, Nydia POUR, MD  Active   clopidogrel  (PLAVIX ) 75 MG tablet [id number] Yes Take 1 tablet (75 mg total) by mouth daily.  Patient taking differently: Take 75 mg by mouth at bedtime.   [name], MD  Active Self, Pharmacy Records  furosemide  (LASIX ) 40 MG tablet [id number] Yes Take 1 tablet (40 mg total) by mouth daily as needed for edema or fluid. Arrien, Elidia Sieving, MD  Active Self, Pharmacy Records  HUMALOG KWIKPEN 100 UNIT/ML  Kary [id number] Yes Inject 0-5 Units into the skin 3 (three) times daily with meals as needed (DMII). [provider]  Active Self, Pharmacy Records  LANTUS  SOLOSTAR 100 UNIT/ML Solostar Pen [id number] Yes Inject 10 Units into the skin 2 (two) times daily. [provider]  Active Self, Pharmacy Records           Med Note (WHITE, DONETA RAMAN   Fri Jun 04, 2024  1:48 AM) Taking Mounjaro and directed to keep on hand if needed  magnesium  oxide (MAG-OX) 400 MG tablet [id number] Yes Take 1 tablet (400 mg total) by mouth daily for 7 days. Davia Nydia POUR, MD  Active   Melatonin 10 MG TABS [id number] Yes Take 20 mg by mouth at bedtime. [provider]  Active Self, Pharmacy Records  methenamine (HIPREX) 1 g tablet [id number] Yes Take 1 g by mouth 2 (two) times daily with a meal. [provider]  Active Self, Pharmacy Records  metoprolol  succinate (TOPROL -XL) 50 MG 24 hr tablet [id number] Yes Take 1 tablet (50 mg total) by mouth at bedtime. Rai, Nydia POUR, MD  Active   mycophenolate  (MYFORTIC ) 180 MG EC tablet [id number] Yes Take 360 mg by mouth in the morning and at bedtime. (360mg ) in the morning and bedtime-( capsules ) [provider]  Active Self, Pharmacy Records  pantoprazole  (PROTONIX ) 40 MG tablet [id number] Yes Take 40 mg by mouth at bedtime.  [provider]  Active Self, Pharmacy Records  prochlorperazine  (COMPAZINE ) 10 MG tablet [id number] Yes Take 1 tablet (10 mg total) by mouth every 6 (six) hours as needed for nausea or vomiting. Rai, Ripudeep K,  MD  Active   promethazine  (PHENERGAN ) 12.5 MG tablet [id number] Yes Take 12.5 mg by mouth every 8 (eight) hours as needed for nausea. [provider]  Active Pharmacy Records, Self  rosuvastatin  (CRESTOR ) 20 MG tablet [id number] Yes Take 1 tablet (20 mg total) by mouth daily.  Patient taking differently: Take 20 mg by mouth at bedtime.   [name], MD  Active Self, Pharmacy Records  tacrolimus   (PROGRAF ) 1 MG capsule [id number] Yes Take 2 mg by mouth in the morning and at bedtime. [provider]  Active Self, Pharmacy Records           Med Note DAVONDA, AUSLEY   Fri Nov 24, 2015 11:54 AM)        tirzepatide (MOUNJARO) 10 MG/0.5ML Pen [id number]  Inject 10 mg into the skin every Wednesday. [provider]  Active Self, Pharmacy Records            Home Care and Equipment/Supplies: Were Home Health Services Ordered?: NA Any new equipment or medical supplies ordered?: NA  Functional Questionnaire: Do you need assistance with bathing/showering or dressing?: No Do you need assistance with meal preparation?: No Do you need assistance with eating?: No Do you have difficulty maintaining continence: No Do you need assistance with getting out of bed/getting out of a chair/moving?: No Do you have difficulty managing or taking your medications?: No  Follow up appointments reviewed: PCP Follow-up appointment confirmed?: Yes Date of PCP follow-up appointment?: 06/08/24 Follow-up Provider: Rosina Amy Specialist [facility] Follow-up appointment confirmed?: No Reason Specialist Follow-Up Not Confirmed: Patient has Specialist Provider Number and will Call for Appointment Do you need transportation to your follow-up appointment?: No Do you understand care options if your condition(s) worsen?: Yes-patient verbalized understanding  SDOH Interventions Today    Flowsheet Row Most Recent Value  SDOH Interventions   Food Insecurity Interventions Intervention Not Indicated  Housing Interventions Intervention Not Indicated  Transportation Interventions Intervention Not Indicated  Utilities Interventions Intervention Not Indicated    Ethanjames Fontenot J. Nillie Bartolotta RN, MSN [facility]  [facility], [facility] Health RN Care Manager Direct Dial: [phone]  Fax: [phone] Website: [email or web address]   "

## 2024-06-22 ENCOUNTER — Other Ambulatory Visit: Payer: Self-pay

## 2024-06-22 NOTE — Transitions of Care (Post Inpatient/ED Visit) (Signed)
 " Transition of Care week 2  Visit Note  06/22/2024  Name: [name] MRN: [id number]          DOB: [birth year]  Situation: Patient enrolled in [facility] 30-day program. Visit completed with patient by telephone.   Background:   Initial Transition Care Management Follow-up Telephone Call Discharge Date and Diagnosis: 06/07/24, AKI (acute kidney injury)   Past Medical History:  Diagnosis Date   Anemia    Below knee amputation (HCC)    RIGHT   CHF (congestive heart failure) (HCC)    PT UNAWARE OF THIS DX   Chronic kidney disease    PAST HX OF ESRD- HAD KIDNEY TRANSPLANT 01-24-2013   Critical lower limb ischemia (HCC) 10/05/2015   Enlarged heart    WITH IRREGULAR HEART RATE   GERD (gastroesophageal reflux disease)    High cholesterol    History of blood transfusion    related to kidney transplant   Hypercholesteremia 08/14/2006   Qualifier: Diagnosis of  By: [name]     Hypertension    Kidney transplant recipient    01/24/2013   Mechanical complication of other vascular device, implant, and graft 09/22/2012   Metabolic bone disease    Orthostatic hypotension    PAD (peripheral artery disease)    PT UNAWARE OF THIS DIAGNOSIS   Pericardial effusion    Retinopathy    Type II diabetes mellitus (HCC)    controlled with diet    Assessment: Patient Reported Symptoms: Cognitive Cognitive Status: Alert and oriented to person, place, and time, Normal speech and language skills      Neurological Neurological Review of Symptoms: No symptoms reported    HEENT HEENT Symptoms Reported: No symptoms reported      Cardiovascular Cardiovascular Symptoms Reported: No symptoms reported    Respiratory Respiratory Symptoms Reported: No symptoms reported    Endocrine Endocrine Symptoms Reported: No symptoms reported Is patient diabetic?: Yes Is patient checking blood sugars at home?: Yes List most recent blood sugar readings, include date and time of day: Blood sugars in  the 150's per patient Endocrine Self-Management Outcome: 4 (good)  Gastrointestinal Gastrointestinal Symptoms Reported: No symptoms reported      Genitourinary Genitourinary Symptoms Reported: No symptoms reported Additional Genitourinary Details: Recent acute kidney injury/UTI. Patient denies symptoms. Genitourinary Self-Management Outcome: 4 (good)  Integumentary Integumentary Symptoms Reported: No symptoms reported    Musculoskeletal Musculoskelatal Symptoms Reviewed: No symptoms reported Musculoskeletal Comment: Right BKA- wears prothesis      Psychosocial Psychosocial Symptoms Reported: No symptoms reported         There were no vitals filed for this visit. Pain Scale: 0-10 Pain Score: 0-No pain  Medications Reviewed Today     Reviewed by [name], RN (Case Manager) on 06/22/24 at 1019  Med List Status: <None>   Medication Order Taking? Sig Documenting Provider Last Dose Status Informant  amLODipine  (NORVASC ) 5 MG tablet [id number] Yes Take 1 tablet (5 mg total) by mouth at bedtime. [name], MD  Active Self, Pharmacy Records  clopidogrel  (PLAVIX ) 75 MG tablet [id number] Yes Take 1 tablet (75 mg total) by mouth daily.  Patient taking differently: Take 75 mg by mouth at bedtime.   [name], MD  Active Self, Pharmacy Records  furosemide  (LASIX ) 40 MG tablet [id number] Yes Take 1 tablet (40 mg total) by mouth daily as needed for edema or fluid. Arrien, Elidia Sieving, MD  Active Self, Pharmacy Records  [name] 100 UNIT/ML KwikPen [id number] Yes Inject 0-5 Units  into the skin 3 (three) times daily with meals as needed (DMII). [provider]  Active Self, Pharmacy Records  LANTUS  SOLOSTAR 100 UNIT/ML Solostar Pen [id number] Yes Inject 10 Units into the skin 2 (two) times daily. [provider]  Active Self, Pharmacy Records           Med Note (WHITE, DONETA RAMAN   Fri Jun 04, 2024  1:48 AM) Taking Mounjaro and directed to keep on hand if needed   Melatonin 10 MG TABS [id number] Yes Take 20 mg by mouth at bedtime. [provider]  Active Self, Pharmacy Records  methenamine (HIPREX) 1 g tablet [id number] Yes Take 1 g by mouth 2 (two) times daily with a meal. [provider]  Active Self, Pharmacy Records  metoprolol  succinate (TOPROL -XL) 50 MG 24 hr tablet [id number] Yes Take 1 tablet (50 mg total) by mouth at bedtime. Rai, Nydia POUR, MD  Active   mycophenolate  (MYFORTIC ) 180 MG EC tablet [id number] Yes Take 360 mg by mouth in the morning and at bedtime. (360mg ) in the morning and bedtime-( capsules ) [provider]  Active Self, Pharmacy Records  pantoprazole  (PROTONIX ) 40 MG tablet [id number] Yes Take 40 mg by mouth at bedtime.  [provider]  Active Self, Pharmacy Records  prochlorperazine  (COMPAZINE ) 10 MG tablet [id number] Yes Take 1 tablet (10 mg total) by mouth every 6 (six) hours as needed for nausea or vomiting. Rai, Nydia POUR, MD  Active   promethazine  (PHENERGAN ) 12.5 MG tablet [id number] Yes Take 12.5 mg by mouth every 8 (eight) hours as needed for nausea. [provider]  Active Pharmacy Records, Self  rosuvastatin  (CRESTOR ) 20 MG tablet [id number] Yes Take 1 tablet (20 mg total) by mouth daily.  Patient taking differently: Take 20 mg by mouth at bedtime.   [name], MD  Active Self, Pharmacy Records  tacrolimus  (PROGRAF ) 1 MG capsule [id number] Yes Take 2 mg by mouth in the morning and at bedtime. [provider]  Active Self, Pharmacy Records           Med Note [name], STRETCH   Fri Nov 24, 2015 11:54 AM)        tirzepatide (MOUNJARO) 10 MG/0.5ML Pen [id number]  Inject 10 mg into the skin every Wednesday. [provider]  Active Self, Pharmacy Records            Goals Addressed             This Visit's Progress    VBCI Transitions of Care (TOC) Care Plan       Problems:  Recent Hospitalization for treatment of Acute Kidney Injury/UTI Knowledge Deficit  Related to Acute Kidney Injury/ UTI  Goal:  Over the next 30 days, the patient will not experience hospital readmission  Interventions:  Transitions of Care: Doctor Visits  - discussed the importance of doctor visits Reviewed Signs and symptoms of infection  Patient Self Care Activities:  Attend all scheduled provider appointments Call pharmacy for medication refills 3-7 days in advance of running out of medications Call provider office for new concerns or questions  Notify RN Care Manager of TOC call rescheduling needs Participate in Transition of Care Program/Attend TOC scheduled calls Take medications as prescribed   Check blood sugar at least 3 times per day.  Drink plenty of water  or other fluids. Take antibiotics exactly as your healthcare provider tells you. Monitor for fever, chills, increased urination, burning on urination, blood in urine, nausea, vomiting, or pain  in abdomen  Plan:  The patient has been provided with contact information for the care management team and has been advised to call with any health related questions or concerns.         Recommendation:   Continue Current Plan of Care  Follow Up Plan:   Telephone follow-up in 1 week  Shery Wauneka J. Densil Ottey RN, MSN [facility], [facility] Health RN Care Manager Direct Dial: [phone]  Fax: [phone] Website: [email or web address]      "

## 2024-06-22 NOTE — Patient Instructions (Signed)
 Visit Information  Thank you for taking time to visit with me today. Please don't hesitate to contact me if I can be of assistance to you before our next scheduled telephone appointment.  Our next appointment is by telephone on 06/29/24 at 1000 am  Following is a copy of your care plan:   Goals Addressed             This Visit's Progress    VBCI Transitions of Care (TOC) Care Plan       Problems:  Recent Hospitalization for treatment of Acute Kidney Injury/UTI Knowledge Deficit Related to Acute Kidney Injury/ UTI  Goal:  Over the next 30 days, the patient will not experience hospital readmission  Interventions:  Transitions of Care: Doctor Visits  - discussed the importance of doctor visits Reviewed Signs and symptoms of infection  Patient Self Care Activities:  Attend all scheduled provider appointments Call pharmacy for medication refills 3-7 days in advance of running out of medications Call provider office for new concerns or questions  Notify RN Care Manager of TOC call rescheduling needs Participate in Transition of Care Program/Attend TOC scheduled calls Take medications as prescribed   Check blood sugar at least 3 times per day.  Drink plenty of water  or other fluids. Take antibiotics exactly as your healthcare provider tells you. Monitor for fever, chills, increased urination, burning on urination, blood in urine, nausea, vomiting, or pain in abdomen  Plan:  The patient has been provided with contact information for the care management team and has been advised to call with any health related questions or concerns.         Patient verbalizes understanding of instructions and care plan provided today and agrees to view in MyChart. Active MyChart status and patient understanding of how to access instructions and care plan via MyChart confirmed with patient.     The patient has been provided with contact information for the care management team and has been  advised to call with any health related questions or concerns.   Please call the care guide team at [phone] if you need to cancel or reschedule your appointment.   Please call the Suicide and Crisis Lifeline: 988 call the [place]  National Suicide Prevention Lifeline: 4055978766 or TTY: 323-097-4408 TTY 623 379 7981) to talk to a trained counselor if you are experiencing a Mental Health or Behavioral Health Crisis or need someone to talk to.  Zaion Hreha J. Shawntae Lowy RN, MSN [facility], [facility] Health RN Care Manager Direct Dial: [phone]  Fax: [phone] Website: [email or web address]

## 2024-06-29 ENCOUNTER — Telehealth: Payer: Self-pay

## 2024-07-09 ENCOUNTER — Ambulatory Visit: Admitting: Neurology

## 2024-07-09 ENCOUNTER — Encounter: Payer: Self-pay | Admitting: Neurology

## 2024-07-09 VITALS — BP 159/77 | HR 96 | Ht 60.0 in | Wt 156.4 lb

## 2024-07-09 DIAGNOSIS — R55 Syncope and collapse: Secondary | ICD-10-CM

## 2024-07-09 NOTE — Patient Instructions (Signed)
 Good to see you. Continue to monitor symptoms. If passing out recurs without any triggers, let me know and we will plan for a prolonged EEG. Follow-up in 3 months, call for any changes.

## 2024-07-09 NOTE — Progress Notes (Signed)
 "  NEUROLOGY CONSULTATION NOTE  [name] MRN: [id number] DOB: 1970-08-30  Referring provider: Dr. Elidia Furnace Primary care provider: Dr. Elveria Kaiser  Reason for consult:  syncope  Dear Dr Furnace:  Thank you for your kind referral of [name] for consultation of the above symptoms. Although her history is well known to you, please allow me to reiterate it for the purpose of our medical record. The patient was accompanied to the clinic by her mother who also provides collateral information. Records and images were personally reviewed where available.  Discussed the use of AI scribe software for clinical note transcription with the patient, who gave verbal consent to proceed.  History of Present Illness This is a 54 year old left-handed woman with a history of ESRD s/p renal transplant (2014), supine hypertension and severe orthostatic hypotension due to diabetic autonomic neuropathy, hyperlipidemia, PAD s/p R BKA, presenting for evaluation of syncope that occurred on 05/10/2024. She reports having vertigo the week prior where she could not take a few steps without getting dizzy. She saw her PCP on 11/20 and was diagnosed with vertigo, then on 11/24 symptoms worsened so she was preparing to go to Urgent care, she recalls sitting on the chair then waking up in the kitchen with EMS around her. Her mother found her unresponsive with eyes set, drooling pink froth. She was able to answer EMS questions and had vomiting. She was admitted for 3 days, she was diagnosed with syncope secondary to orthostatic hypotension. EKG showed sinus rhythm with right atrial enlargement, echocardiogram EF 70-75% with severe concentric LVH. Head CT and EEG normal. BP medications were adjusted. She reports another syncopal episode on 12/18. She had bloodwork done and nephrologist called her to go to the hospital due to electrolyte abnormalities. She recalls feeling dizzy upon sitting up and passed out as  EMS was moving her from her bed. EMS report notes near syncope when standing, BP was 134/72.   She has a history of orthostatic hypotension but has not passed out from it in years prior to these recent episodes. She also experiences episodes of being out of it, which she associates with fluctuations in her blood sugar levels. Occasional shakiness is reported, but no significant body jerking or twitching. She uses a Dexcom device to monitor her blood sugar levels, which alerts her to any significant changes. She denies any olfactory/gustatory hallucinations, rising epigastric sensation, headaches, dizziness, diplopia, dysarthria/dysphagia, neck/back pain, bowel/bladder dysfunction. She reports sleeping about seven hours per night, though sometimes she struggles to fall asleep. She lives with her mother.   She had a normal birth and early development.  There is no history of febrile convulsions, CNS infections such as meningitis/encephalitis, significant traumatic brain injury, neurosurgical procedures, or family history of seizures.  PAST MEDICAL HISTORY: Past Medical History:  Diagnosis Date   Anemia    Below knee amputation (HCC)    RIGHT   CHF (congestive heart failure) (HCC)    PT UNAWARE OF THIS DX   Chronic kidney disease    PAST HX OF ESRD- HAD KIDNEY TRANSPLANT 01-24-2013   Critical lower limb ischemia (HCC) 10/05/2015   Enlarged heart    WITH IRREGULAR HEART RATE   GERD (gastroesophageal reflux disease)    High cholesterol    History of blood transfusion    related to kidney transplant   Hypercholesteremia 08/14/2006   Qualifier: Diagnosis of  By: [name]     Hypertension    Kidney transplant recipient  01/24/2013   Mechanical complication of other vascular device, implant, and graft 09/22/2012   Metabolic bone disease    Orthostatic hypotension    PAD (peripheral artery disease)    PT UNAWARE OF THIS DIAGNOSIS   Pericardial effusion    Retinopathy    Type II  diabetes mellitus (HCC)    controlled with diet    PAST SURGICAL HISTORY: Past Surgical History:  Procedure Laterality Date   AMPUTATION Right 10/13/2015   Procedure: Right Transmetatarsal Amputation;  Surgeon: Jerona Harden GAILS, MD;  Location: [facility] OR;  Service: Orthopedics;  Laterality: Right;   AMPUTATION Right 11/29/2015   Procedure: RIGHT BELOW KNEE AMPUTATION;  Surgeon: Jerona GAILS Harden, MD;  Location: [facility] OR;  Service: Orthopedics;  Laterality: Right;   AMPUTATION FINGER     Rt hand middle finger   AV FISTULA PLACEMENT  10/04/2011   Procedure: INSERTION OF ARTERIOVENOUS (AV) GORE-TEX GRAFT ARM;  Surgeon: Lonni GORMAN Blade, MD;  Location: [facility] OR;  Service: Vascular;  Laterality: Left;  Insertion left upper arm Arteriovenous goretex graft   AV FISTULA PLACEMENT  10/29/2011   Procedure: ARTERIOVENOUS (AV) FISTULA CREATION;  Surgeon: Lonni GORMAN Blade, MD;  Location: [facility] OR;  Service: Vascular;  Laterality: Right;  Creation Right Arteriovenous Fistula    AVGG REMOVAL  10/04/2011   Procedure: REMOVAL OF ARTERIOVENOUS GORETEX GRAFT (AVGG);  Surgeon: Carlin FORBES Haddock, MD;  Location: [facility] OR;  Service: Vascular;  Laterality: Left;   BELOW THE KNEE AMPUTATION Right    BLADDER SUSPENSION     03/08/2022   CHOLECYSTECTOMY N/A 12/08/2015   Procedure: LAPAROSCOPIC CHOLECYSTECTOMY WITH INTRAOPERATIVE CHOLANGIOGRAM;  Surgeon: Jina Nephew, MD;  Location: [facility] OR;  Service: General;  Laterality: N/A;   ESOPHAGOGASTRODUODENOSCOPY N/A 12/24/2012   Procedure: ESOPHAGOGASTRODUODENOSCOPY (EGD);  Surgeon: Toribio SHAUNNA Cedar, MD;  Location: [facility] ENDOSCOPY;  Service: Endoscopy;  Laterality: N/A;   FINGER AMPUTATION Right 02/26/2013   3rd finger   INSERTION OF DIALYSIS CATHETER  10/04/2011   Procedure: INSERTION OF DIALYSIS CATHETER;  Surgeon: Lonni GORMAN Blade, MD;  Location: [facility] OR;  Service: Vascular;  Laterality: Right;  insertion of dialysis catheter right internal jugular   INSERTION OF DIALYSIS CATHETER   06/23/2012   Procedure: INSERTION OF DIALYSIS CATHETER;  Surgeon: Lonni GORMAN Blade, MD;  Location: [facility] OR;  Service: Vascular;  Laterality: N/A;  Ultrasound guided   KIDNEY TRANSPLANT  01/24/2013   LOWER EXTREMITY ANGIOGRAPHY Left 08/12/2016   Procedure: Lower Extremity Angiography;  Surgeon: Selinda GORMAN Gu, MD;  Location: [facility] INVASIVE CV LAB;  Service: Cardiovascular;  Laterality: Left;   LOWER EXTREMITY INTERVENTION  08/12/2016   Procedure: Lower Extremity Intervention;  Surgeon: Selinda GORMAN Gu, MD;  Location: [facility] INVASIVE CV LAB;  Service: Cardiovascular;;   NEPHRECTOMY TRANSPLANTED ORGAN     PATCH ANGIOPLASTY  06/23/2012   Procedure: PATCH ANGIOPLASTY;  Surgeon: Lonni GORMAN Blade, MD;  Location: [facility] OR;  Service: Vascular;  Laterality: Right;   PERIPHERAL VASCULAR CATHETERIZATION N/A 09/19/2015   Procedure: Lower Extremity Angiography;  Surgeon: Gordy Bergamo, MD;  Location: [facility] INVASIVE CV LAB;  Service: Cardiovascular;  Laterality: N/A;   PERIPHERAL VASCULAR CATHETERIZATION N/A 09/19/2015   Procedure: Abdominal Aortogram;  Surgeon: Gordy Bergamo, MD;  Location: [facility] INVASIVE CV LAB;  Service: Cardiovascular;  Laterality: N/A;   PERIPHERAL VASCULAR CATHETERIZATION Right 09/19/2015   Procedure: Peripheral Vascular Intervention;  Surgeon: Gordy Bergamo, MD;  Location: [facility] INVASIVE CV LAB;  Service: Cardiovascular;  Laterality: Right;  Right Common  Iliac   PERIPHERAL  VASCULAR CATHETERIZATION N/A 10/06/2015   Procedure: Lower Extremity Angiography;  Surgeon: Gordy Bergamo, MD;  Location: [facility] INVASIVE CV LAB;  Service: Cardiovascular;  Laterality: N/A;   PERIPHERAL VASCULAR CATHETERIZATION  10/06/2015   Procedure: Peripheral Vascular Intervention;  Surgeon: Gordy Bergamo, MD;  Location: [facility] INVASIVE CV LAB;  Service: Cardiovascular;;  REIA  Omnilink 6.0x29, innova 7x80  RSFA innova 5x80   REVISON OF ARTERIOVENOUS FISTULA  06/23/2012   Procedure: REVISON OF ARTERIOVENOUS FISTULA;  Surgeon: Lonni GORMAN Blade, MD;   Location: [facility] OR;  Service: Vascular;  Laterality: Right;  Ultrasound guided   SHUNTOGRAM N/A 04/06/2012   Procedure: RICH;  Surgeon: Lonni GORMAN Blade, MD;  Location: [facility] CATH LAB;  Service: Cardiovascular;  Laterality: N/A;    MEDICATIONS: Medications Ordered Prior to Encounter[1]  ALLERGIES: Allergies[2]  FAMILY HISTORY: Family History  Problem Relation Age of Onset   Malignant hyperthermia Mother    Thyroid  disease Mother    Hypertension Mother    Malignant hyperthermia Father    Hypertension Father    Diabetes Brother    Anesthesia problems Neg Hx    Colon cancer Neg Hx    Esophageal cancer Neg Hx    Colon polyps Neg Hx    Rectal cancer Neg Hx    Stomach cancer Neg Hx     SOCIAL HISTORY: Social History   Socioeconomic History   Marital status: Single    Spouse name: Not on file   Number of children: 0   Years of education: Not on file   Highest education level: Not on file  Occupational History   Not on file  Tobacco Use   Smoking status: Never   Smokeless tobacco: Never  Vaping Use   Vaping status: Never Used  Substance and Sexual Activity   Alcohol use: No   Drug use: No   Sexual activity: Not on file  Other Topics Concern   Not on file  Social History Narrative   Are you right handed or left handed? left   Are you currently employed ? 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 No    Who lives with you? mom   What type of home do you live in: 1 story or 2 story?  1 story  with basement        Social Drivers of Health   Tobacco Use: Low Risk (07/09/2024)   Patient History    Smoking Tobacco Use: Never    Smokeless Tobacco Use: Never    Passive Exposure: Not on file  Financial Resource Strain: Not on file  Food Insecurity: Low Risk (06/22/2024)   Received from [facility]   Epic    Within the past 12 months, you worried that your food would run out before you got money to buy more: Never true     Within the past 12 months, the food you bought just didn't last and you didn't have money to get more. : Never true  Transportation Needs: No Transportation Needs (06/22/2024)   Received from [facility]    In the past 12 months, has lack of reliable transportation kept you from medical appointments, meetings, work or from getting things needed for daily living? : No  Physical Activity: Not on file  Stress: Not on file  Social Connections: Not on file  Intimate Partner Violence: Not At Risk (06/08/2024)   Epic    Fear of Current or  Ex-Partner: No    Emotionally Abused: No    Physically Abused: No    Sexually Abused: No  Depression (PHQ2-9): Low Risk (06/08/2024)   Depression (PHQ2-9)    PHQ-2 Score: 0  Alcohol Screen: Not on file  Housing: Low Risk (06/22/2024)   Received from [facility]   Epic    What is your living situation today?: I have a steady place to live    Think about the place you live. Do you have problems with any of the following? Choose all that apply:: None/None on this list  Utilities: Low Risk (06/22/2024)   Received from [facility]   Utilities    In the past 12 months has the electric, gas, oil, or water  company threatened to shut off services in your home? : No  Health Literacy: Not on file     PHYSICAL EXAM: Vitals:   07/09/24 0953  BP: (!) 159/77  Pulse: 96  SpO2: 99%   General: No acute distress Head:  Normocephalic/atraumatic Skin/Extremities: No rash, no edema, s/p right BKA with prosthesis Neurological Exam: Mental status: alert and oriented to person, place, and time, no dysarthria or aphasia, Fund of knowledge is appropriate.  Recent and remote memory are intact, 3/3 delayed recall.  Attention and concentration are normal, 5/5 WORLD backwards.  Cranial nerves: CN I: not tested CN II: pupils equal, round and reactive to light, visual fields intact CN III, IV, VI:  full range of motion, no nystagmus, no ptosis CN V:  facial sensation intact CN VII: upper and lower face symmetric CN VIII: hearing intact to conversation Bulk & Tone: normal, no fasciculations. Motor: 5/5 throughout, s/p right BKA Sensation: intact to light touch, cold, pin on both UE, decreased pin and cold on right thigh, intact vibration sense on left LE.  Romberg test negative Deep Tendon Reflexes: unable to elicit reflexes throughout Cerebellar: no incoordination on finger to nose testing Gait: narrow-based and steady, no ataxia Tremor: none   IMPRESSION: This is a 54 year old left-handed woman with a history of ESRD s/p renal transplant (2014), supine hypertension and severe orthostatic hypotension due to diabetic autonomic neuropathy, hyperlipidemia, PAD s/p R BKA, presenting for evaluation of syncope that occurred on 05/10/2024. She had a near syncopal episode in 05/2024. She has a history of orthostatic hypotension but has not had syncope in a while until 04/2024, concern for seizure was raised however after reviewing history and studies done, likelihood for seizure is low. We agreed to continue to monitor symptoms and plan for prolonged EEG if syncope recurs without any identifiable triggers (hypotension, electrolyte abnormalities). Follow-up in 3 months, call for any changes.    Thank you for allowing me to participate in the care of this patient. Please do not hesitate to call for any questions or concerns.   Darice Shivers, M.D.  CC: Dr. Noralee, Dr. Jhon     [1]  Current Outpatient Medications on File Prior to Visit  Medication Sig Dispense Refill   amLODipine  (NORVASC ) 5 MG tablet Take 1 tablet (5 mg total) by mouth at bedtime. 30 tablet 0   clopidogrel  (PLAVIX ) 75 MG tablet Take 1 tablet (75 mg total) by mouth daily. 90 tablet 3   furosemide  (LASIX ) 40 MG tablet Take 1 tablet (40 mg total) by mouth daily as needed for edema or fluid.     HUMALOG KWIKPEN 100 UNIT/ML KwikPen Inject 0-5 Units into the skin 3 (three) times  daily with meals as needed (DMII).  LANTUS  SOLOSTAR 100 UNIT/ML Solostar Pen Inject 10 Units into the skin 2 (two) times daily. (Patient taking differently: Inject 8 Units into the skin daily.)     Melatonin 10 MG TABS Take 20 mg by mouth at bedtime.     methenamine (HIPREX) 1 g tablet Take 1 g by mouth 2 (two) times daily with a meal.     metoprolol  succinate (TOPROL -XL) 25 MG 24 hr tablet Take 25 mg by mouth at bedtime.     MOUNJARO 5 MG/0.5ML Pen Inject 5 mg into the skin once a week.     mycophenolate  (MYFORTIC ) 180 MG EC tablet Take 360 mg by mouth in the morning and at bedtime. (360mg ) in the morning and bedtime-( capsules )     pantoprazole  (PROTONIX ) 40 MG tablet Take 40 mg by mouth at bedtime.      prochlorperazine  (COMPAZINE ) 10 MG tablet Take 1 tablet (10 mg total) by mouth every 6 (six) hours as needed for nausea or vomiting. 30 tablet 0   promethazine  (PHENERGAN ) 12.5 MG tablet Take 12.5 mg by mouth every 8 (eight) hours as needed for nausea.     rosuvastatin  (CRESTOR ) 20 MG tablet Take 1 tablet (20 mg total) by mouth daily. 90 tablet 3   tacrolimus  (PROGRAF ) 1 MG capsule Take 2 mg by mouth in the morning and at bedtime.     No current facility-administered medications on file prior to visit.  [2]  Allergies Allergen Reactions   Fish Allergy Anaphylaxis    Transplant donor allergic to all seafood, and patient instructed not to eat.    Shellfish Allergy Anaphylaxis    Transplant donor was allergic and patient instructed not to eat any type of seafood.   "

## 2024-07-12 ENCOUNTER — Ambulatory Visit: Admitting: Neurology

## 2024-07-12 ENCOUNTER — Other Ambulatory Visit: Payer: Self-pay | Admitting: Cardiology

## 2024-07-14 ENCOUNTER — Other Ambulatory Visit: Payer: Self-pay

## 2024-07-14 DIAGNOSIS — E7800 Pure hypercholesterolemia, unspecified: Secondary | ICD-10-CM

## 2024-07-20 MED ORDER — ROSUVASTATIN CALCIUM 20 MG PO TABS: 20.0000 mg | ORAL_TABLET | Freq: Every day | ORAL | 3 refills | Status: AC

## 2024-07-20 MED ORDER — AMLODIPINE BESYLATE 5 MG PO TABS: 5.0000 mg | ORAL_TABLET | Freq: Every day | ORAL | 3 refills | Status: AC

## 2024-07-20 MED ORDER — METOPROLOL SUCCINATE ER 25 MG PO TB24: 25.0000 mg | ORAL_TABLET | Freq: Every day | ORAL | 3 refills | Status: AC

## 2024-10-07 ENCOUNTER — Ambulatory Visit: Payer: Self-pay | Admitting: Neurology
